# Patient Record
Sex: Female | Born: 1955
Health system: Southern US, Community
[De-identification: ages and names within clinical notes are randomized; demographics above are authoritative.]

## PROBLEM LIST (undated history)

## (undated) DIAGNOSIS — R002 Palpitations: Secondary | ICD-10-CM

## (undated) DIAGNOSIS — J439 Emphysema, unspecified: Secondary | ICD-10-CM

## (undated) DIAGNOSIS — I739 Peripheral vascular disease, unspecified: Secondary | ICD-10-CM

## (undated) DIAGNOSIS — R531 Weakness: Secondary | ICD-10-CM

## (undated) DIAGNOSIS — R079 Chest pain, unspecified: Secondary | ICD-10-CM

## (undated) DIAGNOSIS — R000 Tachycardia, unspecified: Secondary | ICD-10-CM

## (undated) DIAGNOSIS — G8194 Hemiplegia, unspecified affecting left nondominant side: Secondary | ICD-10-CM

## (undated) DIAGNOSIS — K219 Gastro-esophageal reflux disease without esophagitis: Secondary | ICD-10-CM

## (undated) DIAGNOSIS — F32A Depression, unspecified: Secondary | ICD-10-CM

## (undated) DIAGNOSIS — G4700 Insomnia, unspecified: Secondary | ICD-10-CM

## (undated) DIAGNOSIS — K449 Diaphragmatic hernia without obstruction or gangrene: Secondary | ICD-10-CM

## (undated) DIAGNOSIS — I493 Ventricular premature depolarization: Secondary | ICD-10-CM

## (undated) DIAGNOSIS — M62838 Other muscle spasm: Secondary | ICD-10-CM

## (undated) DIAGNOSIS — I499 Cardiac arrhythmia, unspecified: Secondary | ICD-10-CM

## (undated) DIAGNOSIS — F329 Major depressive disorder, single episode, unspecified: Secondary | ICD-10-CM

## (undated) DIAGNOSIS — B1920 Unspecified viral hepatitis C without hepatic coma: Secondary | ICD-10-CM

## (undated) DIAGNOSIS — K439 Ventral hernia without obstruction or gangrene: Secondary | ICD-10-CM

## (undated) DIAGNOSIS — I639 Cerebral infarction, unspecified: Secondary | ICD-10-CM

## (undated) DIAGNOSIS — E785 Hyperlipidemia, unspecified: Secondary | ICD-10-CM

## (undated) DIAGNOSIS — M6281 Muscle weakness (generalized): Secondary | ICD-10-CM

## (undated) DIAGNOSIS — R011 Cardiac murmur, unspecified: Secondary | ICD-10-CM

## (undated) DIAGNOSIS — I491 Atrial premature depolarization: Principal | ICD-10-CM

## (undated) DIAGNOSIS — I10 Essential (primary) hypertension: Secondary | ICD-10-CM

## (undated) DIAGNOSIS — F419 Anxiety disorder, unspecified: Secondary | ICD-10-CM

## (undated) DIAGNOSIS — B029 Zoster without complications: Secondary | ICD-10-CM

## (undated) DIAGNOSIS — K7460 Unspecified cirrhosis of liver: Secondary | ICD-10-CM

## (undated) DIAGNOSIS — R42 Dizziness and giddiness: Secondary | ICD-10-CM

## (undated) DIAGNOSIS — I69391 Dysphagia following cerebral infarction: Secondary | ICD-10-CM

## (undated) DIAGNOSIS — R0600 Dyspnea, unspecified: Secondary | ICD-10-CM

## (undated) DIAGNOSIS — B0223 Postherpetic polyneuropathy: Secondary | ICD-10-CM

## (undated) HISTORY — DX: Chest pain, unspecified: R07.9

## (undated) HISTORY — DX: Postherpetic polyneuropathy: B02.23

## (undated) HISTORY — PX: APPENDECTOMY: SHX54

## (undated) HISTORY — DX: Emphysema, unspecified: J43.9

## (undated) HISTORY — DX: Peripheral vascular disease, unspecified: I73.9

## (undated) HISTORY — DX: Unspecified cirrhosis of liver: K74.60

## (undated) HISTORY — PX: UPPER GASTROINTESTINAL ENDOSCOPY: SHX188

## (undated) HISTORY — DX: Tachycardia, unspecified: R00.0

## (undated) HISTORY — PX: CATARACT EXTRACTION, BILATERAL: SHX1313

## (undated) HISTORY — DX: Dizziness and giddiness: R42

## (undated) HISTORY — DX: Depression, unspecified: F32.A

## (undated) HISTORY — DX: Unspecified viral hepatitis C without hepatic coma: B19.20

## (undated) HISTORY — DX: Dyspnea, unspecified: R06.00

## (undated) HISTORY — DX: Ventral hernia without obstruction or gangrene: K43.9

## (undated) HISTORY — DX: Weakness: R53.1

## (undated) HISTORY — DX: Zoster without complications: B02.9

## (undated) HISTORY — DX: Diaphragmatic hernia without obstruction or gangrene: K44.9

## (undated) HISTORY — PX: ESOPHAGOGASTRODUODENOSCOPY: SHX1529

## (undated) HISTORY — DX: Ventricular premature depolarization: I49.3

## (undated) HISTORY — PX: COLONOSCOPY: SHX174

## (undated) HISTORY — DX: Atrial premature depolarization: I49.1

---
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
## 2000-03-19 ENCOUNTER — Encounter: Admission: RE | Admit: 2000-03-19 | Discharge: 2000-03-19 | Payer: Self-pay | Admitting: Family Medicine

## 2000-03-19 ENCOUNTER — Encounter: Payer: Self-pay | Admitting: Family Medicine

## 2004-07-10 ENCOUNTER — Emergency Department (HOSPITAL_COMMUNITY): Admission: EM | Admit: 2004-07-10 | Discharge: 2004-07-10 | Payer: Self-pay | Admitting: Emergency Medicine

## 2006-10-22 ENCOUNTER — Emergency Department (HOSPITAL_COMMUNITY): Admission: EM | Admit: 2006-10-22 | Discharge: 2006-10-22 | Payer: Self-pay | Admitting: Emergency Medicine

## 2007-04-07 ENCOUNTER — Emergency Department (HOSPITAL_COMMUNITY): Admission: EM | Admit: 2007-04-07 | Discharge: 2007-04-07 | Payer: Self-pay | Admitting: *Deleted

## 2007-12-25 ENCOUNTER — Emergency Department (HOSPITAL_COMMUNITY): Admission: EM | Admit: 2007-12-25 | Discharge: 2007-12-25 | Payer: Self-pay | Admitting: Emergency Medicine

## 2007-12-28 ENCOUNTER — Ambulatory Visit: Payer: Self-pay | Admitting: Psychiatry

## 2007-12-28 ENCOUNTER — Inpatient Hospital Stay (HOSPITAL_COMMUNITY): Admission: AD | Admit: 2007-12-28 | Discharge: 2008-01-02 | Payer: Self-pay | Admitting: Psychiatry

## 2008-01-05 ENCOUNTER — Other Ambulatory Visit (HOSPITAL_COMMUNITY): Admission: RE | Admit: 2008-01-05 | Discharge: 2008-03-11 | Payer: Self-pay | Admitting: Psychiatry

## 2008-01-15 ENCOUNTER — Ambulatory Visit: Payer: Self-pay | Admitting: Psychiatry

## 2008-02-12 ENCOUNTER — Ambulatory Visit: Payer: Self-pay | Admitting: Psychiatry

## 2008-05-17 ENCOUNTER — Other Ambulatory Visit: Admission: RE | Admit: 2008-05-17 | Discharge: 2008-05-17 | Payer: Self-pay | Admitting: Family Medicine

## 2009-05-26 ENCOUNTER — Emergency Department (HOSPITAL_COMMUNITY): Admission: EM | Admit: 2009-05-26 | Discharge: 2009-05-26 | Payer: Self-pay | Admitting: Emergency Medicine

## 2011-03-27 NOTE — H&P (Signed)
NAMEASCENCION, STEGNER              ACCOUNT NO.:  [id number]   MEDICAL RECORD NO.:  [id number]          PATIENT TYPE:  IPS   LOCATION:  0500                          FACILITY:  [facility]   PHYSICIAN:  Geoffery Lyons, M.D.      DATE OF BIRTH:  1955-11-24   DATE OF ADMISSION:  12/28/2007  DATE OF DISCHARGE:                       PSYCHIATRIC ADMISSION ASSESSMENT   IDENTIFYING INFORMATION:  A 55 year old African-American female.  This  is a voluntary admission.   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      Margaret A. Scott, N.P.      Geoffery Lyons, M.D.  Electronically Signed    MAS/MEDQ  D:  12/29/2007  T:  12/30/2007  Job:  0
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
## 2011-07-30 ENCOUNTER — Emergency Department (HOSPITAL_COMMUNITY)
Admission: EM | Admit: 2011-07-30 | Discharge: 2011-07-30 | Disposition: A | Discharge status: Home / Self Care | Payer: 59 | Attending: Emergency Medicine | Admitting: Emergency Medicine

## 2011-07-30 DIAGNOSIS — B86 Scabies: Secondary | ICD-10-CM | POA: Insufficient documentation

## 2011-08-03 LAB — CBC
Hemoglobin: 14.9
MCV: 96.2
RBC: 4.43
WBC: 8.5

## 2011-08-03 LAB — BENZODIAZEPINE, QUANTITATIVE, URINE
Alprazolam (GC/LC/MS), ur confirm: NEGATIVE
Flurazepam GC/MS Conf: NEGATIVE
Nordiazepam GC/MS Conf: NEGATIVE
Oxazepam GC/MS Conf: 870 ng/mL

## 2011-08-03 LAB — URINALYSIS, ROUTINE W REFLEX MICROSCOPIC
Bilirubin Urine: NEGATIVE
Glucose, UA: NEGATIVE
Ketones, ur: NEGATIVE
Leukocytes, UA: NEGATIVE
Nitrite: NEGATIVE
Protein, ur: NEGATIVE
Specific Gravity, Urine: 1.01
Urobilinogen, UA: 0.2
pH: 5.5

## 2011-08-03 LAB — URINE DRUGS OF ABUSE SCREEN W ALC, ROUTINE (REF LAB)
Ethyl Alcohol: <5
Marijuana Metabolite: NEGATIVE
Opiate Screen, Urine: NEGATIVE
Phencyclidine (PCP): NEGATIVE
Propoxyphene: NEGATIVE

## 2011-08-03 LAB — COMPREHENSIVE METABOLIC PANEL
ALT: 16
AST: 24
CO2: 27
Chloride: 107
Creatinine, Ser: 0.81
GFR calc Af Amer: >60
GFR calc non Af Amer: >60
Sodium: 143
Total Bilirubin: 0.8

## 2011-08-03 LAB — LIPASE, BLOOD: Lipase: 25

## 2011-08-03 LAB — TSH: TSH: 1.499

## 2011-08-03 LAB — URINE MICROSCOPIC-ADD ON

## 2011-08-03 LAB — DIFFERENTIAL
Basophils Absolute: 0
Eosinophils Absolute: 0
Eosinophils Relative: 0

## 2011-08-06 LAB — URINE DRUGS OF ABUSE SCREEN W ALC, ROUTINE (REF LAB)
Barbiturate Quant, Ur: NEGATIVE
Barbiturate Quant, Ur: NEGATIVE
Barbiturate Quant, Ur: NEGATIVE
Barbiturate Quant, Ur: NEGATIVE
Benzodiazepines.: NEGATIVE
Benzodiazepines.: NEGATIVE
Benzodiazepines.: NEGATIVE
Benzodiazepines.: NEGATIVE
Ethyl Alcohol: <5
Ethyl Alcohol: <5
Ethyl Alcohol: <5
Methadone: NEGATIVE
Methadone: NEGATIVE
Phencyclidine (PCP): NEGATIVE
Phencyclidine (PCP): NEGATIVE
Phencyclidine (PCP): NEGATIVE
Phencyclidine (PCP): NEGATIVE
Propoxyphene: NEGATIVE
Propoxyphene: NEGATIVE

## 2011-08-07 LAB — URINE DRUGS OF ABUSE SCREEN W ALC, ROUTINE (REF LAB)
Barbiturate Quant, Ur: NEGATIVE
Barbiturate Quant, Ur: NEGATIVE
Barbiturate Quant, Ur: NEGATIVE
Benzodiazepines.: NEGATIVE
Benzodiazepines.: NEGATIVE
Benzodiazepines.: NEGATIVE
Creatinine,U: 30.2
Marijuana Metabolite: NEGATIVE
Marijuana Metabolite: NEGATIVE
Methadone: NEGATIVE
Methadone: NEGATIVE
Methadone: NEGATIVE
Phencyclidine (PCP): NEGATIVE
Phencyclidine (PCP): NEGATIVE
Phencyclidine (PCP): NEGATIVE
Propoxyphene: NEGATIVE

## 2013-05-04 ENCOUNTER — Inpatient Hospital Stay (HOSPITAL_COMMUNITY): Payer: Self-pay

## 2013-05-04 ENCOUNTER — Encounter (HOSPITAL_COMMUNITY): Payer: Self-pay | Admitting: *Deleted

## 2013-05-04 ENCOUNTER — Inpatient Hospital Stay (HOSPITAL_COMMUNITY)
Admission: EM | Admit: 2013-05-04 | Discharge: 2013-05-13 | DRG: 433 | Disposition: A | Discharge status: Home / Self Care | Payer: MEDICAID | Attending: Internal Medicine | Admitting: Internal Medicine

## 2013-05-04 DIAGNOSIS — R17 Unspecified jaundice: Secondary | ICD-10-CM

## 2013-05-04 DIAGNOSIS — R7402 Elevation of levels of lactic acid dehydrogenase (LDH): Secondary | ICD-10-CM | POA: Diagnosis present

## 2013-05-04 DIAGNOSIS — N390 Urinary tract infection, site not specified: Secondary | ICD-10-CM | POA: Diagnosis present

## 2013-05-04 DIAGNOSIS — B182 Chronic viral hepatitis C: Secondary | ICD-10-CM | POA: Diagnosis present

## 2013-05-04 DIAGNOSIS — E860 Dehydration: Secondary | ICD-10-CM | POA: Diagnosis present

## 2013-05-04 DIAGNOSIS — K7010 Alcoholic hepatitis without ascites: Principal | ICD-10-CM | POA: Diagnosis present

## 2013-05-04 DIAGNOSIS — F411 Generalized anxiety disorder: Secondary | ICD-10-CM | POA: Diagnosis present

## 2013-05-04 DIAGNOSIS — K7200 Acute and subacute hepatic failure without coma: Secondary | ICD-10-CM | POA: Diagnosis present

## 2013-05-04 DIAGNOSIS — F1020 Alcohol dependence, uncomplicated: Secondary | ICD-10-CM | POA: Diagnosis present

## 2013-05-04 DIAGNOSIS — R7401 Elevation of levels of liver transaminase levels: Secondary | ICD-10-CM | POA: Diagnosis present

## 2013-05-04 DIAGNOSIS — K759 Inflammatory liver disease, unspecified: Secondary | ICD-10-CM

## 2013-05-04 DIAGNOSIS — N179 Acute kidney failure, unspecified: Secondary | ICD-10-CM | POA: Diagnosis present

## 2013-05-04 DIAGNOSIS — B029 Zoster without complications: Secondary | ICD-10-CM | POA: Diagnosis present

## 2013-05-04 DIAGNOSIS — F17200 Nicotine dependence, unspecified, uncomplicated: Secondary | ICD-10-CM | POA: Diagnosis present

## 2013-05-04 DIAGNOSIS — F1010 Alcohol abuse, uncomplicated: Secondary | ICD-10-CM

## 2013-05-04 HISTORY — DX: Unspecified viral hepatitis C without hepatic coma: B19.20

## 2013-05-04 LAB — CBC WITH DIFFERENTIAL/PLATELET
Basophils Absolute: 0.1 10*3/uL (ref 0.0–0.1)
Basophils Relative: 1 % (ref 0–1)
Eosinophils Absolute: 0.1 10*3/uL (ref 0.0–0.7)
Eosinophils Relative: 1 % (ref 0–5)
Lymphs Abs: 2.3 10*3/uL (ref 0.7–4.0)
MCH: 34.1 pg — ABNORMAL HIGH (ref 26.0–34.0)
MCHC: 36.2 g/dL — ABNORMAL HIGH (ref 30.0–36.0)
MCV: 94.4 fL (ref 78.0–100.0)
Neutrophils Relative %: 64 % (ref 43–77)
Platelets: 175 10*3/uL (ref 150–400)
RBC: 5.36 MIL/uL — ABNORMAL HIGH (ref 3.87–5.11)
RDW: 14.4 % (ref 11.5–15.5)

## 2013-05-04 LAB — RAPID URINE DRUG SCREEN, HOSP PERFORMED
Cocaine: NOT DETECTED
Opiates: NOT DETECTED

## 2013-05-04 LAB — COMPREHENSIVE METABOLIC PANEL
ALT: 2209 U/L — ABNORMAL HIGH (ref 0–35)
AST: 3314 U/L — ABNORMAL HIGH (ref 0–37)
CO2: 26 mEq/L (ref 19–32)
Calcium: 9.7 mg/dL (ref 8.4–10.5)
Chloride: 100 mEq/L (ref 96–112)
GFR calc Af Amer: 53 mL/min — ABNORMAL LOW (ref >90)
GFR calc non Af Amer: 46 mL/min — ABNORMAL LOW (ref >90)
Glucose, Bld: 112 mg/dL — ABNORMAL HIGH (ref 70–99)
Sodium: 138 mEq/L (ref 135–145)
Total Bilirubin: 19.6 mg/dL (ref 0.3–1.2)

## 2013-05-04 LAB — PROTIME-INR: Prothrombin Time: 16.5 seconds — ABNORMAL HIGH (ref 11.6–15.2)

## 2013-05-04 LAB — URINE MICROSCOPIC-ADD ON

## 2013-05-04 LAB — URINALYSIS, ROUTINE W REFLEX MICROSCOPIC
Glucose, UA: NEGATIVE mg/dL
Ketones, ur: 15 mg/dL — AB
Nitrite: POSITIVE — AB
Protein, ur: 30 mg/dL — AB
Urobilinogen, UA: 1 mg/dL (ref 0.0–1.0)

## 2013-05-04 MED ORDER — VITAMIN B-1 100 MG PO TABS
100.0000 mg | ORAL_TABLET | Freq: Every day | ORAL | Status: DC
  Administered 2013-05-04 – 2013-05-13 (×10): 100 mg via ORAL
  Filled 2013-05-04 (×10): qty 1

## 2013-05-04 MED ORDER — SODIUM CHLORIDE 0.9 % IV BOLUS (SEPSIS)
500.0000 mL | Freq: Once | INTRAVENOUS | Status: AC
  Administered 2013-05-04: 500 mL via INTRAVENOUS

## 2013-05-04 MED ORDER — FOLIC ACID 1 MG PO TABS
1.0000 mg | ORAL_TABLET | Freq: Every day | ORAL | Status: DC
  Administered 2013-05-04 – 2013-05-13 (×10): 1 mg via ORAL
  Filled 2013-05-04 (×10): qty 1

## 2013-05-04 MED ORDER — ONDANSETRON HCL 4 MG/2ML IJ SOLN: 4.0000 mg | Freq: Three times a day (TID) | INTRAMUSCULAR | Status: DC | PRN

## 2013-05-04 MED ORDER — PANTOPRAZOLE SODIUM 40 MG IV SOLR
40.0000 mg | Freq: Once | INTRAVENOUS | Status: AC
  Administered 2013-05-04: 40 mg via INTRAVENOUS
  Filled 2013-05-04: qty 40

## 2013-05-04 MED ORDER — ONDANSETRON HCL 4 MG PO TABS
4.0000 mg | ORAL_TABLET | Freq: Four times a day (QID) | ORAL | Status: DC | PRN
  Administered 2013-05-04 – 2013-05-11 (×4): 4 mg via ORAL
  Filled 2013-05-04 (×4): qty 1

## 2013-05-04 MED ORDER — ALPRAZOLAM 0.5 MG PO TABS
0.5000 mg | ORAL_TABLET | Freq: Three times a day (TID) | ORAL | Status: DC | PRN
  Administered 2013-05-05 – 2013-05-06 (×3): 0.5 mg via ORAL
  Filled 2013-05-04 (×3): qty 1

## 2013-05-04 MED ORDER — ONDANSETRON HCL 4 MG/2ML IJ SOLN
4.0000 mg | Freq: Four times a day (QID) | INTRAMUSCULAR | Status: DC | PRN
  Administered 2013-05-07 – 2013-05-13 (×7): 4 mg via INTRAVENOUS
  Filled 2013-05-04 (×7): qty 2

## 2013-05-04 MED ORDER — SODIUM CHLORIDE 0.9 % IV BOLUS (SEPSIS): 500.0000 mL | Freq: Once | INTRAVENOUS | Status: DC

## 2013-05-04 MED ORDER — ONDANSETRON HCL 4 MG/2ML IJ SOLN
4.0000 mg | Freq: Once | INTRAMUSCULAR | Status: AC
  Administered 2013-05-04: 4 mg via INTRAVENOUS
  Filled 2013-05-04: qty 2

## 2013-05-04 MED ORDER — SODIUM CHLORIDE 0.9 % IV SOLN: INTRAVENOUS | Status: DC

## 2013-05-04 MED ORDER — SODIUM CHLORIDE 0.9 % IV SOLN
INTRAVENOUS | Status: DC
  Administered 2013-05-04 – 2013-05-05 (×3): via INTRAVENOUS
  Administered 2013-05-06: 1000 mL via INTRAVENOUS
  Administered 2013-05-07: 23:00:00 via INTRAVENOUS
  Administered 2013-05-07: 1000 mL via INTRAVENOUS
  Administered 2013-05-08 – 2013-05-13 (×8): via INTRAVENOUS

## 2013-05-04 MED ORDER — DEXTROSE 5 % IV SOLN
1.0000 g | INTRAVENOUS | Status: DC
  Administered 2013-05-04: 1 g via INTRAVENOUS
  Filled 2013-05-04: qty 10

## 2013-05-04 MED ORDER — SENNOSIDES-DOCUSATE SODIUM 8.6-50 MG PO TABS
1.0000 | ORAL_TABLET | Freq: Every evening | ORAL | Status: DC | PRN
  Administered 2013-05-09: 1 via ORAL
  Filled 2013-05-04: qty 1

## 2013-05-04 MED ORDER — OXYCODONE HCL 5 MG PO TABS
5.0000 mg | ORAL_TABLET | ORAL | Status: DC | PRN
  Administered 2013-05-04 – 2013-05-13 (×20): 5 mg via ORAL
  Filled 2013-05-04 (×20): qty 1

## 2013-05-04 NOTE — H&P (Signed)
[facility]          History and Physical    PCP:   Benita Stabile, MD   Chief Complaint:  Yellow eyes  HPI: 57 y/o woman with h/o ETOH abuse who states she quit 4 weeks ago, comes in today because a friend told her that her eyes were yellow. She has also been having suprapubic pain and dark-colored urine. In the ED she was found to have very elevated LFTs. We have been asked to admit her for further evaluation and management.  Allergies:  No Known Allergies   History reviewed. No pertinent past medical history.  Past Surgical History  Procedure Laterality Date  . Appendectomy      Prior to Admission medications   Medication Sig Start Date End Date Taking? Authorizing Provider  ALPRAZolam Prudy Feeler) 0.5 MG tablet Take 0.5 mg by mouth 3 (three) times daily as needed for anxiety.   Yes Historical Provider, MD  gabapentin (NEURONTIN) 600 MG tablet Take 600 mg by mouth 2 (two) times daily.   Yes Historical Provider, MD    Social History:  reports that she has been smoking.  She does not have any smokeless tobacco history on file. She reports that  drinks alcohol. She reports that she does not use illicit drugs.  No family history on file.  Review of Systems:  Constitutional: Denies fever, chills, diaphoresis, appetite change and fatigue.  HEENT: Denies photophobia, eye pain, redness, hearing loss, ear pain, congestion, sore throat, rhinorrhea, sneezing, mouth sores, trouble swallowing, neck pain, neck stiffness and tinnitus.   Respiratory: Denies SOB, DOE, cough, chest tightness,  and wheezing.   Cardiovascular: Denies chest pain, palpitations and leg swelling.  Gastrointestinal: Denies nausea, vomiting, diarrhea, constipation, blood in stool and abdominal distention.  Genitourinary: Denies dysuria, urgency, frequency, hematuria, flank pain and difficulty urinating.  Endocrine: Denies: hot or cold intolerance, sweats, changes in hair or nails, polyuria,  polydipsia. Musculoskeletal: Denies myalgias, back pain, joint swelling, arthralgias and gait problem.  Skin: Denies pallor, rash and wound.  Neurological: Denies dizziness, seizures, syncope, weakness, light-headedness, numbness and headaches.  Hematological: Denies adenopathy. Easy bruising, personal or family bleeding history  Psychiatric/Behavioral: Denies suicidal ideation, mood changes, confusion, nervousness, sleep disturbance and agitation   Physical Exam: Blood pressure 111/75, pulse 91, temperature 98.5 F (36.9 C), temperature source Oral, resp. rate 18, SpO2 99.00%. Gen: AA Ox3, NAD HEENT: [place]/AT/PERRL/EOMI Neck: supple, no JVD, no LAD, no bruits, no goiter. CV: RRR, no M/R/G. Lungs: CTA B ABd: S/NT/ND/+BS/no masses Ext: no C/C/E Neuro: grossly intact and non-focal.  Labs on Admission:  Results for orders placed during the hospital encounter of 05/04/13 (from the past 48 hour(s))  COMPREHENSIVE METABOLIC PANEL     Status: Abnormal   Collection Time    05/04/13  4:18 PM      Result Value Range   Sodium 138  135 - 145 mEq/L   Potassium 3.4 (*) 3.5 - 5.1 mEq/L   Chloride 100  96 - 112 mEq/L   CO2 26  19 - 32 mEq/L   Glucose, Bld 112 (*) 70 - 99 mg/dL   BUN 10  6 - 23 mg/dL   Creatinine, Ser 4.78 (*) 0.50 - 1.10 mg/dL   Comment: ICTERUS AT THIS LEVEL MAY AFFECT RESULT   Calcium 9.7  8.4 - 10.5 mg/dL   Total Protein 6.4  6.0 - 8.3 g/dL   Comment: ICTERUS AT THIS LEVEL MAY AFFECT RESULT   Albumin 3.4 (*) 3.5 -  5.2 g/dL   AST 1610 (*) 0 - 37 U/L   ALT 2209 (*) 0 - 35 U/L   Alkaline Phosphatase 371 (*) 39 - 117 U/L   Total Bilirubin 19.6 (*) 0.3 - 1.2 mg/dL   Comment: CRITICAL RESULT CALLED TO, READ BACK BY AND VERIFIED WITH:     RN D. VOIGT 05/04/13 1733 KERAN M.   GFR calc non Af Amer 46 (*) >90 mL/min   GFR calc Af Amer 53 (*) >90 mL/min   Comment:            The eGFR has been calculated     using the CKD EPI equation.     This calculation has not been      validated in all clinical     situations.     eGFR's persistently     <90 mL/min signify     possible Chronic Kidney Disease.  LIPASE, BLOOD     Status: None   Collection Time    05/04/13  4:18 PM      Result Value Range   Lipase 48  11 - 59 U/L  CBC WITH DIFFERENTIAL     Status: Abnormal   Collection Time    05/04/13  4:18 PM      Result Value Range   WBC 9.3  4.0 - 10.5 K/uL   RBC 5.36 (*) 3.87 - 5.11 MIL/uL   Hemoglobin 18.3 (*) 12.0 - 15.0 g/dL   HCT 96.0 (*) 45.4 - 09.8 %   MCV 94.4  78.0 - 100.0 fL   MCH 34.1 (*) 26.0 - 34.0 pg   MCHC 36.2 (*) 30.0 - 36.0 g/dL   RDW 11.9  14.7 - 82.9 %   Platelets 175  150 - 400 K/uL   Neutrophils Relative % 64  43 - 77 %   Neutro Abs 5.9  1.7 - 7.7 K/uL   Lymphocytes Relative 25  12 - 46 %   Lymphs Abs 2.3  0.7 - 4.0 K/uL   Monocytes Relative 10  3 - 12 %   Monocytes Absolute 0.9  0.1 - 1.0 K/uL   Eosinophils Relative 1  0 - 5 %   Eosinophils Absolute 0.1  0.0 - 0.7 K/uL   Basophils Relative 1  0 - 1 %   Basophils Absolute 0.1  0.0 - 0.1 K/uL  URINALYSIS, ROUTINE W REFLEX MICROSCOPIC     Status: Abnormal   Collection Time    05/04/13  4:37 PM      Result Value Range   Color, Urine BROWN (*) YELLOW   Comment: BIOCHEMICALS MAY BE AFFECTED BY COLOR   APPearance CLOUDY (*) CLEAR   Specific Gravity, Urine 1.021  1.005 - 1.030   pH 5.5  5.0 - 8.0   Glucose, UA NEGATIVE  NEGATIVE mg/dL   Hgb urine dipstick NEGATIVE  NEGATIVE   Bilirubin Urine LARGE (*) NEGATIVE   Ketones, ur 15 (*) NEGATIVE mg/dL   Protein, ur 30 (*) NEGATIVE mg/dL   Urobilinogen, UA 1.0  0.0 - 1.0 mg/dL   Nitrite POSITIVE (*) NEGATIVE   Leukocytes, UA SMALL (*) NEGATIVE  URINE MICROSCOPIC-ADD ON     Status: Abnormal   Collection Time    05/04/13  4:37 PM      Result Value Range   Squamous Epithelial / LPF RARE  RARE   WBC, UA 21-50  <3 WBC/hpf   RBC / HPF 0-2  <3 RBC/hpf   Bacteria, UA FEW (*) RARE  Casts HYALINE CASTS (*) NEGATIVE   Comment: WBC CAST    Urine-Other MUCOUS PRESENT     Comment: AMORPHOUS URATES/PHOSPHATES  PROTIME-INR     Status: Abnormal   Collection Time    05/04/13  6:07 PM      Result Value Range   Prothrombin Time 16.5 (*) 11.6 - 15.2 seconds   INR 1.37  0.00 - 1.49  ETHANOL     Status: None   Collection Time    05/04/13  6:07 PM      Result Value Range   Alcohol, Ethyl (B) <11  0 - 11 mg/dL   Comment:            LOWEST DETECTABLE LIMIT FOR     SERUM ALCOHOL IS 11 mg/dL     FOR MEDICAL PURPOSES ONLY    Radiological Exams on Admission: No results found.  Assessment/Plan Principal Problem:   Jaundice Active Problems:   Transaminitis   ARF (acute renal failure)   UTI (lower urinary tract infection)   Jaundice/Transaminitis -Has severe transaminitis that is not in an obstructive pattern. -Will order a RUQ [place], acute hepatitis panel and a tylenol level. -not on a statin or any other liver-toxic meds. -Suspect this is mainly from her chronic ETOH use. -Consider GI eval pending results of work up.  UTI -Continue rocephin pending cx data.  ARF -Possibly pre-renal in origin. -IVF, recheck renal function in am.  DVT Prophylaxis -SCDs.   Time Spent on Admission: 75 minutes  HERNANDEZ ACOSTA,ESTELA [facility] Pager: [phone] 05/04/2013, 6:57 PM

## 2013-05-04 NOTE — ED Notes (Signed)
Patient transported to Ultrasound 

## 2013-05-04 NOTE — Progress Notes (Signed)
Pt is alert and oriented from home with family. Transferred to unit via stretcher. Patient is oriented to room and call bell is within reach. Patient has no skin issues. Will continue to monitor.   Verley Pariseau J. Lendell Caprice RN

## 2013-05-04 NOTE — Progress Notes (Signed)
Unit CM UR Completed by [facility] ED CM  W. Phillp Dolores RN  

## 2013-05-04 NOTE — ED Notes (Signed)
PT used to drink daily and then stopped drinking on Saturday.  Since then she has been having lower abdominal pain, nausea, bad taste in her mouth, urine looks brown but now states now bright yellow.  Pt states that today her eyes are yellow.  Pt reports head hurts sometimes too.  Pt reports if she eats she vomits

## 2013-05-04 NOTE — ED Notes (Signed)
Family updated on plan of care. Pt. In ultrasound.

## 2013-05-04 NOTE — ED Provider Notes (Addendum)
History    CSN: [id number] Arrival date & time 05/04/13  1603  First MD Initiated Contact with Patient 05/04/13 1706      Chief complaint: jaundice   (Consider location/radiation/quality/duration/timing/severity/associated sxs/prior Treatment) The history is provided by the patient.  pt with hx  etoh abuse, states noticed eyes yellow in past week, and also has noticed urine dark in color. No dysuria or urgency. States intermittent abd cramping/discomfort, but no constant or focal abd pain. Pt denies specific exacerbating or alleviating factors. Poor appetite. No known wt loss. Hx heavy/daily etoh use, states stopped drinking 2 days ago. Denies hx etoh withdrawal or dts. Denies tremor or shakes. Nausea. No vomiting. Had bm today, sl loose. No melena or rectal bleeding. Denies fever or chills. Denies prior dx liver disease.    History reviewed. No pertinent past medical history. Past Surgical History  Procedure Laterality Date  . Appendectomy     No family history on file. History  Substance Use Topics  . Smoking status: Current Some Day Smoker  . Smokeless tobacco: Not on file  . Alcohol Use: Yes     Comment: stopped on saturday   OB History   Grav Para Term Preterm Abortions TAB SAB Ect Mult Living                 Review of Systems  Constitutional: Negative for fever and chills.  HENT: Negative for neck pain.   Eyes: Negative for visual disturbance.  Respiratory: Negative for cough and shortness of breath.   Cardiovascular: Negative for chest pain.  Gastrointestinal: Negative for vomiting and abdominal pain.  Genitourinary: Negative for flank pain.  Musculoskeletal: Negative for back pain.  Skin: Negative for rash.  Neurological: Negative for headaches.  Hematological: Does not bruise/bleed easily.  Psychiatric/Behavioral: Negative for confusion.    Allergies  Review of patient's allergies indicates no known allergies.  Home Medications   Current Outpatient Rx   Name  Route  Sig  Dispense  Refill  . ALPRAZolam (XANAX) 0.5 MG tablet   Oral   Take 0.5 mg by mouth 3 (three) times daily as needed for anxiety.         . gabapentin (NEURONTIN) 600 MG tablet   Oral   Take 600 mg by mouth 2 (two) times daily.          BP 111/75  Pulse 91  Temp(Src) 98.5 F (36.9 C) (Oral)  Resp 18  SpO2 99% Physical Exam  Nursing note and vitals reviewed. Constitutional: She is oriented to person, place, and time. She appears well-developed and well-nourished. No distress.  HENT:  Mouth/Throat: Oropharynx is clear and moist.  Eyes: Conjunctivae are normal. Scleral icterus is present.  Neck: Neck supple. No tracheal deviation present.  Cardiovascular: Normal rate, regular rhythm, normal heart sounds and intact distal pulses.   Pulmonary/Chest: Effort normal and breath sounds normal. No respiratory distress.  Abdominal: Soft. Normal appearance and bowel sounds are normal. She exhibits no distension. There is no tenderness. There is no rebound and no guarding.  Genitourinary:  No cva tenderness  Musculoskeletal: She exhibits no edema and no tenderness.  Neurological: She is alert and oriented to person, place, and time.  Motor intact bil. Steady gait.   Skin: Skin is warm and dry. No rash noted.  Psychiatric: She has a normal mood and affect.    ED Course  Procedures (including critical care time)  Results for orders placed during the hospital encounter of 05/04/13  COMPREHENSIVE METABOLIC  PANEL      Result Value Range   Sodium 138  135 - 145 mEq/L   Potassium 3.4 (*) 3.5 - 5.1 mEq/L   Chloride 100  96 - 112 mEq/L   CO2 26  19 - 32 mEq/L   Glucose, Bld 112 (*) 70 - 99 mg/dL   BUN 10  6 - 23 mg/dL   Creatinine, Ser 5.62 (*) 0.50 - 1.10 mg/dL   Calcium 9.7  8.4 - 13.0 mg/dL   Total Protein 6.4  6.0 - 8.3 g/dL   Albumin 3.4 (*) 3.5 - 5.2 g/dL   AST 8657 (*) 0 - 37 U/L   ALT 2209 (*) 0 - 35 U/L   Alkaline Phosphatase 371 (*) 39 - 117 U/L   Total  Bilirubin 19.6 (*) 0.3 - 1.2 mg/dL   GFR calc non Af Amer 46 (*) >90 mL/min   GFR calc Af Amer 53 (*) >90 mL/min  LIPASE, BLOOD      Result Value Range   Lipase 48  11 - 59 U/L  CBC WITH DIFFERENTIAL      Result Value Range   WBC 9.3  4.0 - 10.5 K/uL   RBC 5.36 (*) 3.87 - 5.11 MIL/uL   Hemoglobin 18.3 (*) 12.0 - 15.0 g/dL   HCT 84.6 (*) 96.2 - 95.2 %   MCV 94.4  78.0 - 100.0 fL   MCH 34.1 (*) 26.0 - 34.0 pg   MCHC 36.2 (*) 30.0 - 36.0 g/dL   RDW 84.1  32.4 - 40.1 %   Platelets 175  150 - 400 K/uL   Neutrophils Relative % 64  43 - 77 %   Neutro Abs 5.9  1.7 - 7.7 K/uL   Lymphocytes Relative 25  12 - 46 %   Lymphs Abs 2.3  0.7 - 4.0 K/uL   Monocytes Relative 10  3 - 12 %   Monocytes Absolute 0.9  0.1 - 1.0 K/uL   Eosinophils Relative 1  0 - 5 %   Eosinophils Absolute 0.1  0.0 - 0.7 K/uL   Basophils Relative 1  0 - 1 %   Basophils Absolute 0.1  0.0 - 0.1 K/uL  URINALYSIS, ROUTINE W REFLEX MICROSCOPIC      Result Value Range   Color, Urine BROWN (*) YELLOW   APPearance CLOUDY (*) CLEAR   Specific Gravity, Urine 1.021  1.005 - 1.030   pH 5.5  5.0 - 8.0   Glucose, UA NEGATIVE  NEGATIVE mg/dL   Hgb urine dipstick NEGATIVE  NEGATIVE   Bilirubin Urine LARGE (*) NEGATIVE   Ketones, ur 15 (*) NEGATIVE mg/dL   Protein, ur 30 (*) NEGATIVE mg/dL   Urobilinogen, UA 1.0  0.0 - 1.0 mg/dL   Nitrite POSITIVE (*) NEGATIVE   Leukocytes, UA SMALL (*) NEGATIVE  URINE MICROSCOPIC-ADD ON      Result Value Range   Squamous Epithelial / LPF RARE  RARE   WBC, UA 21-50  <3 WBC/hpf   RBC / HPF 0-2  <3 RBC/hpf   Bacteria, UA FEW (*) RARE   Casts HYALINE CASTS (*) NEGATIVE   Urine-Other MUCOUS PRESENT       MDM  Iv ns. Labs.   Reviewed nursing notes and prior charts for additional history.   ua w 21-50 wbc, few bact, culture sent. Rocephin iv.  protonix iv. zofran iv.  Additional ns iv.  Med service called for admission.  Discussed w Dr Ardyth Harps who indicates temp orders, team 6,  med/surg bed, and to add u/s abd to workup - done.       Suzi Roots, MD 05/04/13 [phone]

## 2013-05-05 DIAGNOSIS — N390 Urinary tract infection, site not specified: Secondary | ICD-10-CM

## 2013-05-05 DIAGNOSIS — K759 Inflammatory liver disease, unspecified: Secondary | ICD-10-CM

## 2013-05-05 LAB — URINE CULTURE: Colony Count: 60000

## 2013-05-05 LAB — COMPREHENSIVE METABOLIC PANEL
ALT: 1453 U/L — ABNORMAL HIGH (ref 0–35)
Alkaline Phosphatase: 267 U/L — ABNORMAL HIGH (ref 39–117)
CO2: 22 mEq/L (ref 19–32)
GFR calc Af Amer: 76 mL/min — ABNORMAL LOW (ref >90)
GFR calc non Af Amer: 66 mL/min — ABNORMAL LOW (ref >90)
Glucose, Bld: 84 mg/dL (ref 70–99)
Potassium: 4 mEq/L (ref 3.5–5.1)
Sodium: 138 mEq/L (ref 135–145)

## 2013-05-05 LAB — ACETAMINOPHEN LEVEL: Acetaminophen (Tylenol), Serum: <15 ug/mL (ref 10–30)

## 2013-05-05 LAB — HEPATIC FUNCTION PANEL
ALT: 1550 U/L — ABNORMAL HIGH (ref 0–35)
AST: 2366 U/L — ABNORMAL HIGH (ref 0–37)
Alkaline Phosphatase: 272 U/L — ABNORMAL HIGH (ref 39–117)
Bilirubin, Direct: 11.4 mg/dL — ABNORMAL HIGH (ref 0.0–0.3)
Total Bilirubin: 14.8 mg/dL — ABNORMAL HIGH (ref 0.3–1.2)

## 2013-05-05 LAB — HEPATITIS PANEL, ACUTE
HCV Ab: REACTIVE — AB
Hepatitis B Surface Ag: NEGATIVE
Hepatitis B Surface Ag: NEGATIVE

## 2013-05-05 LAB — CBC
MCHC: 36.3 g/dL — ABNORMAL HIGH (ref 30.0–36.0)
Platelets: 219 10*3/uL (ref 150–400)
RDW: 14.6 % (ref 11.5–15.5)

## 2013-05-05 NOTE — Consult Note (Signed)
Subjective:   HPI  The patient is a 57 year old female with a history of heavy alcohol use for years. She came to the hospital where she was admitted after friends told her that her eyes were yellow. She didn't notice this. She states that she quit drinking alcohol about a month ago because after she had a beer she didn't feel good. She does have some generalized lower abdominal discomfort. She states that she has had some vomiting but denies hematemesis. She denies IV drug use. She has not started on any new medications recently. Her liver enzymes were found to be markedly elevated.  Review of Systems Jaundice, no chest pain or shortness of breath History reviewed. No pertinent past medical history. Past Surgical History  Procedure Laterality Date  . Appendectomy     History   Social History  . Marital Status: Divorced    Spouse Name: N/A    Number of Children: N/A  . Years of Education: N/A   Occupational History  . Not on file.   Social History Main Topics  . Smoking status: Current Some Day Smoker  . Smokeless tobacco: Not on file  . Alcohol Use: Yes     Comment: stopped on saturday  . Drug Use: No  . Sexually Active: Not on file   Other Topics Concern  . Not on file   Social History Narrative  . No narrative on file   family history is not on file. Current facility-administered medications:0.9 %  sodium chloride infusion, , Intravenous, Continuous, Henderson Cloud, MD, Last Rate: 75 mL/hr at 05/05/13 0955;  ALPRAZolam Prudy Feeler) tablet 0.5 mg, 0.5 mg, Oral, TID PRN, Henderson Cloud, MD, 0.5 mg at 05/05/13 1116;  folic acid (FOLVITE) tablet 1 mg, 1 mg, Oral, Daily, Henderson Cloud, MD, 1 mg at 05/05/13 0955 ondansetron [facility]) injection 4 mg, 4 mg, Intravenous, Q6H PRN, Henderson Cloud, MD;  ondansetron [facility]) tablet 4 mg, 4 mg, Oral, Q6H PRN, Henderson Cloud, MD, 4 mg at 05/04/13 2149;  oxyCODONE (Oxy IR/ROXICODONE)  immediate release tablet 5 mg, 5 mg, Oral, Q4H PRN, Henderson Cloud, MD, 5 mg at 05/05/13 1116;  senna-docusate (Senokot-S) tablet 1 tablet, 1 tablet, Oral, QHS PRN, Henderson Cloud, MD thiamine (VITAMIN B-1) tablet 100 mg, 100 mg, Oral, Daily, Henderson Cloud, MD, 100 mg at 05/05/13 0955 No Known Allergies   Objective:     BP 93/55  Pulse 68  Temp(Src) 98.3 F (36.8 C) (Oral)  Resp 16  Ht 5\' 5"  (1.651 m)  Wt 51.891 kg (114 lb 6.4 oz)  BMI 19.04 kg/m2  SpO2 97%  Alert and oriented and in no acute distress  Eyes markedly jaundiced sclera  Heart regular rhythm no murmurs  Lungs clear  Abdomen: Bowel sounds normal, soft, nontender no obvious enlargement of liver or spleen that I could feel.  Review of labs show marked elevation of transaminases. There is also a positive hepatitis C antibody. Hepatitis A and B. studies are pending    Laboratory No components found with this basename: d1      Assessment:     #1. Elevated liver enzymes, I can't tell right now whether we are dealing with alcoholic hepatitis given the history that she hasn't had any alcohol in the past month.  #2. History of alcohol abuse  #3. Positive hepatitis C antibody       Plan:     Supportive care. Monitor  LFTs. Avoid alcohol completely. Check on the rest of the hepatitis profile. Lab Results  Component Value Date   HGB 15.0 05/05/2013   HGB 18.3* 05/04/2013   HGB 14.9 12/25/2007   HCT 41.3 05/05/2013   HCT 50.6* 05/04/2013   HCT 42.6 12/25/2007   ALKPHOS 267* 05/05/2013   ALKPHOS 272* 05/05/2013   ALKPHOS 371* 05/04/2013   ALKPHOS 104 12/25/2007   AST 2283* 05/05/2013   AST 2366* 05/05/2013   AST 3314* 05/04/2013   AST 24 12/25/2007   ALT 1453* 05/05/2013   ALT 1550* 05/05/2013   ALT 2209* 05/04/2013   ALT 16 12/25/2007

## 2013-05-05 NOTE — Progress Notes (Signed)
   CARE MANAGEMENT NOTE 05/05/2013  Patient:  CHAKA, BOYSON   Account Number:  [id number]  Date Initiated:  05/05/2013  Documentation initiated by:  [facility]  Subjective/Objective Assessment:   abd pain, UTI, hepatitis C     Action/Plan:   lives at home with dtr, [address] [phone]   Anticipated DC Date:  05/06/2013   Anticipated DC Plan:  HOME/SELF CARE      DC Planning Services  CM consult      Choice offered to / List presented to:             Status of service:  In process, will continue to follow Medicare Important Message given?   (If response is "NO", the following Medicare IM given date fields will be blank) Date Medicare IM given:   Date Additional Medicare IM given:    Discharge Disposition:    Per UR Regulation:    If discussed at Long Length of Stay Meetings, dates discussed:    Comments:  05/05/2013 1430 NCM spoke to pt and gave permission to speak to dtr or son. Dtr, Efraim Kaufmann reports pt goes to Dr [name]. Her out of pocket cost runs $25. She usually goes 2x per year. Pt states her out of pocket cost for her medications run $16 at [facility]. Isidoro Donning RN CCM Case Mgmt phone [phone]

## 2013-05-05 NOTE — Progress Notes (Signed)
[facility] PROGRESS NOTE  Kara Bailey VHQ:[id number] DOB: Feb 06, 1956 DOA: 05/04/2013 PCP: Benita Stabile, MD  Assessment/Plan:  Principle problem Jaundice/transaminitis - probably secondary to alcoholic hepatitis,  acetaminophen level normal -Positive Hep C AB-check viral load - clinically asymptomatic; afebrile, no leukocytosis; PT/INR normal;  - continue monitoring LFTs;  - monitor albumin and total protein; both decreased from yesterday - possibly early cirrhosis on abdominal ultrasound, no significant biliary or pancreatic abnormalities - continue thiamine and folic acid, follow CIWA protocol   Active problem  Acute renal failure -resolved with IVF  Urinary tract infection - urine culture pending - assymptomatic-await urine cultures -monitor off antibiotics  Anxiety -prn Xanax  Code Status: Full Family Communication: sister at bedside Disposition Plan: Inpatient   Consultants:  GI  Procedures:  none  Antibiotics:  Rocephin started 05/04/2013  HPI/Subjective: Pt reports yellow eyes as reason for coming to hospital. She quit drinking 4 weeks ago due to nausea associated with a "change in the formula" of her usual beer. Her alcohol history consisted of 3-4 12 oz beers daily and a 12-pack of beers on the weekend for the past 40 years. She tried drinking other beer and alcoholic beverages but they consistently made her nauseous so she discontinued drinking.   Objective: Filed Vitals:   05/04/13 1612 05/04/13 1702 05/04/13 2045 05/05/13 0526  BP: 114/73 111/75 119/73 93/55  Pulse: 91  77 68  Temp: 98.5 F (36.9 C)  98.5 F (36.9 C) 98.3 F (36.8 C)  TempSrc: Oral  Oral Oral  Resp: 16 18 18 16   Height:   5\' 5"  (1.651 m)   Weight:   51.891 kg (114 lb 6.4 oz)   SpO2: 97% 99% 99% 97%    Intake/Output Summary (Last 24 hours) at 05/05/13 1122 Last data filed at 05/05/13 0900  Gross per 24 hour  Intake    120 ml  Output      0 ml  Net    120  ml   Filed Weights   05/04/13 2045  Weight: 51.891 kg (114 lb 6.4 oz)    Exam:   General:  Alert and oriented x 3 female in no apparent distress  HEENT: scleroicterus bilaterally  Cardiovascular: regular rate and rhythm, no murmurs, gallops, or rubs, no JVD  Respiratory: clear to auscultation bilaterally, no increased work of breathing  Abdomen: +BS, soft nontender, no distension, no masses or hepatosplenomegaly.   Musculoskeletal: no peripheral edema   Data Reviewed: Basic Metabolic Panel:  Recent Labs Lab 05/04/13 1618 05/05/13 0440  NA 138 138  K 3.4* 4.0  CL 100 107  CO2 26 22  GLUCOSE 112* 84  BUN 10 8  CREATININE 1.26* 0.93  CALCIUM 9.7 8.5   Liver Function Tests:  Recent Labs Lab 05/04/13 1618 05/05/13 0440  AST 3314* 2283*  ALT 2209* 1453*  ALKPHOS 371* 267*  BILITOT 19.6* 15.0*  PROT 6.4 4.6*  ALBUMIN 3.4* 2.6*    Recent Labs Lab 05/04/13 1618  LIPASE 48   No results found for this basename: AMMONIA,  in the last 168 hours CBC:  Recent Labs Lab 05/04/13 1618 05/05/13 0440  WBC 9.3 9.7  NEUTROABS 5.9  --   HGB 18.3* 15.0  HCT 50.6* 41.3  MCV 94.4 92.4  PLT 175 219   Cardiac Enzymes: No results found for this basename: CKTOTAL, CKMB, CKMBINDEX, TROPONINI,  in the last 168 hours BNP (last 3 results) No results found for this basename: PROBNP,  in the last  8760 hours CBG: No results found for this basename: GLUCAP,  in the last 168 hours  No results found for this or any previous visit (from the past 240 hour(s)).   Studies: US Abdomen Complete  05/04/2013   *RADIOLOGY REPORT*  Clinical Data:  Elevated liver function tests, EtOH abuse  COMPLETE ABDOMINAL ULTRASOUND  Comparison:  None.  Findings:  Gallbladder:  The gallbladder is contracted.  No gallstones are seen and there is no pain over the gallbladder with compression.  Common bile duct:  The common bile duct is normal measuring 2.6 mm in diameter.  Liver:  The liver has a  relatively normal echogenic appearance. The contours of the liver may be slightly nodular and early cirrhosis is a consideration.  No focal hepatic abnormality is seen.  IVC:  Appears normal.  Pancreas:  The pancreas is partially obscured by bowel gas.  Spleen:  The spleen is normal measuring 4.9 cm sagittally.  Right Kidney:  No hydronephrosis is seen.  The right kidney measures 9.3 cm sagittally.  Left Kidney:  No hydronephrosis is noted.  The left kidney measures 9.8 cm.  Abdominal aorta:  The abdominal aorta is normal in caliber.  IMPRESSION:  1.  Contracted gallbladder.  No gallstones or pain over the gallbladder. 2.  Slightly nodular contours of the liver may indicate mild cirrhosis.  No focal abnormality is seen. 3.  Portions of the pancreas are obscured by bowel gas.   Original Report Authenticated By: Dwyane Dee, M.D.    Scheduled Meds: . folic acid  1 mg Oral Daily  . thiamine  100 mg Oral Daily   Continuous Infusions: . sodium chloride 75 mL/hr at 05/05/13 1610    Principal Problem:   Jaundice Active Problems:   Transaminitis   ARF (acute renal failure)   UTI (lower urinary tract infection)    [name] PA-S   [facility] . If 7PM-7AM, please contact night-coverage at [email or web address], password [facility] 05/05/2013, 11:22 AM  LOS: 1 day   Attending Patient seen and examined,agree with the assessment and plan as outlined above.Not sure what the exact etiology of her transaminitis is, suspect it is still ETOH related, although she claims she quit drinking a month ago. Hep C antibody is positive, therefore will check a Viral Load  S Baylen Buckner

## 2013-05-06 ENCOUNTER — Encounter (HOSPITAL_COMMUNITY): Payer: Self-pay | Admitting: Radiology

## 2013-05-06 ENCOUNTER — Inpatient Hospital Stay (HOSPITAL_COMMUNITY): Payer: 59

## 2013-05-06 LAB — HEPATIC FUNCTION PANEL
AST: 3192 U/L — ABNORMAL HIGH (ref 0–37)
Albumin: 2.5 g/dL — ABNORMAL LOW (ref 3.5–5.2)
Total Bilirubin: 17.1 mg/dL — ABNORMAL HIGH (ref 0.3–1.2)

## 2013-05-06 LAB — PROTIME-INR: Prothrombin Time: 21.2 seconds — ABNORMAL HIGH (ref 11.6–15.2)

## 2013-05-06 MED ORDER — ENSURE COMPLETE PO LIQD
237.0000 mL | Freq: Two times a day (BID) | ORAL | Status: DC
  Administered 2013-05-06 – 2013-05-07 (×2): 237 mL via ORAL

## 2013-05-06 MED ORDER — IOHEXOL 300 MG/ML  SOLN
25.0000 mL | INTRAMUSCULAR | Status: AC
  Administered 2013-05-06: 25 mL via ORAL

## 2013-05-06 MED ORDER — IOHEXOL 300 MG/ML  SOLN
80.0000 mL | Freq: Once | INTRAMUSCULAR | Status: AC | PRN
  Administered 2013-05-06: 80 mL via INTRAVENOUS

## 2013-05-06 NOTE — Progress Notes (Signed)
INITIAL NUTRITION ASSESSMENT  DOCUMENTATION CODES Per approved criteria  -Not Applicable   INTERVENTION: 1. Ensure Complete po BID, each supplement provides 350 kcal and 13 grams of protein.   NUTRITION DIAGNOSIS: Inadequate oral intake related to decreased appetite as evidenced by poor meal completion.   Goal: >/=75% meal completion  Monitor:  PO intake, weight trends, labs, I/Os  Reason for Assessment: Consult   57 y.o. female  Admitting Dx: Jaundice  ASSESSMENT: Pt admitted with jaundice, found with Hep C. Has hx of alcohol abuse. Reportedly stopped drinking 4 weeks ago.  Discussed in rounds, pt not eating well at this time. Sister states pt has not been eating well for several weeks at home. Mostly only one meal per day. Unknown amount of weight loss. Pt sleeping soundly at time of RD visit. Would rouse to voice but quickly fall back to sleep with out answering any questions.  Family requesting nutrition supplements. RD will add Ensure Complete BID. Spoke with RN, provide Ensure with medications for increased consumption.   Nutrition Focused Physical Exam:  Subcutaneous Fat:  Orbital Region: WNL Upper Arm Region: WNL Thoracic and Lumbar Region: WNl  Muscle:  Temple Region: mild wasting Clavicle Bone Region: mild wasting Clavicle and Acromion Bone Region: WNL Scapular Bone Region: WNL Dorsal Hand: n/a Patellar Region: WNL Anterior Thigh Region: n/a Posterior Calf Region: WNL   Edema: n/a    Height: Ht Readings from Last 1 Encounters:  05/04/13 5\' 5"  (1.651 m)    Weight: Wt Readings from Last 1 Encounters:  05/04/13 114 lb 6.4 oz (51.891 kg)    Ideal Body Weight: 125 lbs   % Ideal Body Weight: 91%  Wt Readings from Last 10 Encounters:  05/04/13 114 lb 6.4 oz (51.891 kg)    Usual Body Weight: unknown   % Usual Body Weight: --  BMI:  Body mass index is 19.04 kg/(m^2). WNL  Estimated Nutritional Needs: Kcal: 1350-1550 Protein: 55-65 gm  Fluid:  1.4-1.6 L   Skin: intact   Diet Order: General  EDUCATION NEEDS: -No education needs identified at this time   Intake/Output Summary (Last 24 hours) at 05/06/13 1021 Last data filed at 05/06/13 0600  Gross per 24 hour  Intake    960 ml  Output      0 ml  Net    960 ml    Last BM: PTA   Labs:   Recent Labs Lab 05/04/13 1618 05/05/13 0440  NA 138 138  K 3.4* 4.0  CL 100 107  CO2 26 22  BUN 10 8  CREATININE 1.26* 0.93  CALCIUM 9.7 8.5  GLUCOSE 112* 84    CBG (last 3)  No results found for this basename: GLUCAP,  in the last 72 hours  Scheduled Meds: . folic acid  1 mg Oral Daily  . thiamine  100 mg Oral Daily    Continuous Infusions: . sodium chloride 75 mL/hr at 05/05/13 2316    History reviewed. No pertinent past medical history.  Past Surgical History  Procedure Laterality Date  . Appendectomy      Clarene Duke RD, LDN Pager [phone] After Hours pager [phone]

## 2013-05-06 NOTE — Progress Notes (Signed)
Pt attempting to drink contrast 

## 2013-05-06 NOTE — Progress Notes (Signed)
   CARE MANAGEMENT NOTE 05/06/2013  Patient:  Kara Bailey, Kara Bailey   Account Number:  [id number]  Date Initiated:  05/05/2013  Documentation initiated by:  [facility]  Subjective/Objective Assessment:   abd pain, UTI, hepatitis C     Action/Plan:   lives at home with dtr, [address] [phone]   Anticipated DC Date:  05/06/2013   Anticipated DC Plan:  HOME/SELF CARE  In-house referral  Financial Counselor      DC Planning Services  CM consult      Choice offered to / List presented to:             Status of service:  In process, will continue to follow Medicare Important Message given?   (If response is "NO", the following Medicare IM given date fields will be blank) Date Medicare IM given:   Date Additional Medicare IM given:    Discharge Disposition:    Per UR Regulation:    If discussed at Long Length of Stay Meetings, dates discussed:    Comments:  05/06/2013 1600 NCM spoke to sister, Tamela Gammon # [phone] or [phone]. Provided her with information on community resources such as SS Admin, DSS, and [employer]. 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 She feels she will not be able to return to work. Explained pt can follow up with SS Admin for disability benefits and speak to her PCP to assist with paperwork. Isidoro Donning RN CCM Case Mgmt phone [phone]  05/05/2013 1430 NCM spoke to pt and gave permission to speak to dtr or son. Dtr, Efraim Kaufmann reports pt goes to Dr [name]. Her out of pocket cost runs $25. She usually goes 2x per year. Pt states her out of pocket cost for her medications run $16 at [facility]. Isidoro Donning RN CCM Case Mgmt phone [phone]

## 2013-05-06 NOTE — Progress Notes (Signed)
[facility] Progress Note  Subjective: The patient has no specific complaints today.  Objective: Vital signs in last 24 hours: Temp:  [98.3 F (36.8 C)-99 F (37.2 C)] 98.6 F (37 C) (06/25 0513) Pulse Rate:  [69-82] 82 (06/25 0513) Resp:  [16] 16 (06/25 0513) BP: (110-130)/(66-77) 128/77 mmHg (06/25 0513) SpO2:  [96 %-99 %] 96 % (06/25 0513) Weight change:    PE  Scleral icterus   heart regular rhythm  Abdomen is soft and nontender  The AST, ALT, bilirubin, and prothrombin time have risen in 1 day and are now 3192, 1654, 17.1, and 21.2 respectively  Lab Results: Results for orders placed during the hospital encounter of 05/04/13 (from the past 24 hour(s))  PROTIME-INR     Status: Abnormal   Collection Time    05/05/13  6:23 PM      Result Value Range   Prothrombin Time 19.4 (*) 11.6 - 15.2 seconds   INR 1.70 (*) 0.00 - 1.49  PROTIME-INR     Status: Abnormal   Collection Time    05/06/13  5:43 AM      Result Value Range   Prothrombin Time 21.2 (*) 11.6 - 15.2 seconds   INR 1.92 (*) 0.00 - 1.49  HEPATIC FUNCTION PANEL     Status: Abnormal   Collection Time    05/06/13  5:43 AM      Result Value Range   Total Protein 4.6 (*) 6.0 - 8.3 g/dL   Albumin 2.5 (*) 3.5 - 5.2 g/dL   AST 1610 (*) 0 - 37 U/L   ALT 1654 (*) 0 - 35 U/L   Alkaline Phosphatase 277 (*) 39 - 117 U/L   Total Bilirubin 17.1 (*) 0.3 - 1.2 mg/dL   Bilirubin, Direct 96.0 (*) 0.0 - 0.3 mg/dL   Indirect Bilirubin 4.5 (*) 0.3 - 0.9 mg/dL    Studies/Results: @RISRSLT24 @    Assessment: #1. History of alcohol abuse.  #2. Rising elevated liver enzymes.  #3. Positive hepatitis C    Plan: It is unusual in alcoholic hepatitis to see transaminases this high. She also gives a history of not having had alcohol in the past month. This raises the question of some type of other acute liver injury going on. Sometimes ischemic liver injury might do this. . An acetaminophen level was not  elevated on admission. I'm going to recommend that we check an ANA just in case she might have an underlying not immune hepatitis. If LFTs continue to rise by tomorrow I would recommend referral to a tertiary referral center for a hepatology evaluation.    Sylvester Salonga F 05/06/2013, 1:20 PM  Lab Results  Component Value Date   HGB 15.0 05/05/2013   HGB 18.3* 05/04/2013   HGB 14.9 12/25/2007   HCT 41.3 05/05/2013   HCT 50.6* 05/04/2013   HCT 42.6 12/25/2007   ALKPHOS 277* 05/06/2013   ALKPHOS 267* 05/05/2013   ALKPHOS 272* 05/05/2013   ALKPHOS 371* 05/04/2013   AST 3192* 05/06/2013   AST 2283* 05/05/2013   AST 2366* 05/05/2013   AST 3314* 05/04/2013   ALT 1654* 05/06/2013   ALT 1453* 05/05/2013   ALT 1550* 05/05/2013   ALT 2209* 05/04/2013

## 2013-05-06 NOTE — Progress Notes (Signed)
Pt given and reminded contrast to drink for abd ct. Pt states she has no clock to check for time to drink, RN told pt she would remind her about time, RN reminded pt to drink contrast, pt states she is unable to drink the contrast.

## 2013-05-06 NOTE — Progress Notes (Signed)
[facility] PROGRESS NOTE  DIM MEISINGER ZOX:[id number] DOB: 07/18/1956 DOA: 05/04/2013 PCP: Benita Stabile, MD  HPI/Subjective: Pt is lethargic and has decreased appetite. She noted one episode of emesis yesterday, but denied hematemesis. She is continuing to have mild abdominal pain that is no different from her usual pain.   Assessment/Plan: Principle problem  Jaundice/transaminitis -secondary to hepatitis C and alcoholic hepatitis; positive HCV Ab 6/24;  -ordered viral load count -afebrile, no leukocytosis -LFTs and INR increased from yesterday  -albumin slightly decreased from yesterday; total protein is unchanged -continue monitoring LFTs, albumin, total protein, and PT/INR -acetaminophen level normal  - possibly early cirrhosis on abdominal ultrasound, no significant biliary or pancreatic abnormalities  - continue thiamine and folic acid, follow CIWA protocol  - await GI recommendations.  Active problems   Acute renal failure  -resolved with IVF   Urinary tract infection  - urine culture indicated multiple bacterial morphoctyes present, none predominant - Asymptomatic- no treatment at this time  - monitor off antibiotics   Anxiety  -prn Xanax  Code Status: Full Family Communication:  Sister and fiance Disposition Plan: Inpatient   Consultants:  GI  Procedures:  none  Antibiotics:  Rocephin d/c 6/23    Objective: Filed Vitals:   05/05/13 0526 05/05/13 1420 05/05/13 2047 05/06/13 0513  BP: 93/55 110/66 130/69 128/77  Pulse: 68 69 76 82  Temp: 98.3 F (36.8 C) 98.3 F (36.8 C) 99 F (37.2 C) 98.6 F (37 C)  TempSrc: Oral Oral Oral Oral  Resp: 16 16 16 16   Height:      Weight:      SpO2: 97% 99% 96% 96%    Intake/Output Summary (Last 24 hours) at 05/06/13 1119 Last data filed at 05/06/13 0600  Gross per 24 hour  Intake    960 ml  Output      0 ml  Net    960 ml   Filed Weights   05/04/13 2045  Weight: 51.891 kg (114 lb  6.4 oz)    Exam:   General:  Lethargic, slender female resting in bed, A&O x3, in NAD  HEENT: Eyes- icterus bilaterally   Cardiovascular: regular rate and rhythm, no murmurs, gallops, or rubs   Respiratory: clear to auscultation bilaterally, no increased work of breathing.   Abdomen: bowel sounds normal, soft, mild tenderness to deep palpation of RUQ and RLQ, no masses or hepatosplenomegaly   Data Reviewed: Basic Metabolic Panel:  Recent Labs Lab 05/04/13 1618 05/05/13 0440  NA 138 138  K 3.4* 4.0  CL 100 107  CO2 26 22  GLUCOSE 112* 84  BUN 10 8  CREATININE 1.26* 0.93  CALCIUM 9.7 8.5   Liver Function Tests:  Recent Labs Lab 05/04/13 1618 05/05/13 0440 05/06/13 0543  AST 3314* 2283*  2366* 3192*  ALT 2209* 1453*  1550* 1654*  ALKPHOS 371* 267*  272* 277*  BILITOT 19.6* 15.0*  14.8* 17.1*  PROT 6.4 4.6*  4.7* 4.6*  ALBUMIN 3.4* 2.6*  2.6* 2.5*    Recent Labs Lab 05/04/13 1618  LIPASE 48   No results found for this basename: AMMONIA,  in the last 168 hours CBC:  Recent Labs Lab 05/04/13 1618 05/05/13 0440  WBC 9.3 9.7  NEUTROABS 5.9  --   HGB 18.3* 15.0  HCT 50.6* 41.3  MCV 94.4 92.4  PLT 175 219   Cardiac Enzymes: No results found for this basename: CKTOTAL, CKMB, CKMBINDEX, TROPONINI,  in the last 168 hours BNP (last  3 results) No results found for this basename: PROBNP,  in the last 8760 hours CBG: No results found for this basename: GLUCAP,  in the last 168 hours  Recent Results (from the past 240 hour(s))  URINE CULTURE     Status: None   Collection Time    05/04/13  4:37 PM      Result Value Range Status   Specimen Description URINE, RANDOM   Final   Special Requests NONE   Final   Culture  Setup Time 05/04/2013 17:45   Final   Colony Count 60,000 COLONIES/ML   Final   Culture     Final   Value: Multiple bacterial morphotypes present, none predominant. Suggest appropriate recollection if clinically indicated.   Report  Status 05/05/2013 FINAL   Final     Studies: US Abdomen Complete  05/04/2013   *RADIOLOGY REPORT*  Clinical Data:  Elevated liver function tests, EtOH abuse  COMPLETE ABDOMINAL ULTRASOUND  Comparison:  None.  Findings:  Gallbladder:  The gallbladder is contracted.  No gallstones are seen and there is no pain over the gallbladder with compression.  Common bile duct:  The common bile duct is normal measuring 2.6 mm in diameter.  Liver:  The liver has a relatively normal echogenic appearance. The contours of the liver may be slightly nodular and early cirrhosis is a consideration.  No focal hepatic abnormality is seen.  IVC:  Appears normal.  Pancreas:  The pancreas is partially obscured by bowel gas.  Spleen:  The spleen is normal measuring 4.9 cm sagittally.  Right Kidney:  No hydronephrosis is seen.  The right kidney measures 9.3 cm sagittally.  Left Kidney:  No hydronephrosis is noted.  The left kidney measures 9.8 cm.  Abdominal aorta:  The abdominal aorta is normal in caliber.  IMPRESSION:  1.  Contracted gallbladder.  No gallstones or pain over the gallbladder. 2.  Slightly nodular contours of the liver may indicate mild cirrhosis.  No focal abnormality is seen. 3.  Portions of the pancreas are obscured by bowel gas.   Original Report Authenticated By: Dwyane Dee, M.D.    Scheduled Meds: . feeding supplement  237 mL Oral BID BM  . folic acid  1 mg Oral Daily  . thiamine  100 mg Oral Daily   Continuous Infusions: . sodium chloride 75 mL/hr at 05/05/13 2316    Principal Problem:   Jaundice Active Problems:   Transaminitis   ARF (acute renal failure)   UTI (lower urinary tract infection)    Time spent: 45 minutes    [name] PA-S   [facility] Pager [phone]. If 7PM-7AM, please contact night-coverage at [email or web address], password [facility] 05/06/2013, 11:19 AM  LOS: 2 days     Addendum  Patient seen and examined, chart and data base reviewed.  I agree with the above  assessment and plan.  For full details please see Mrs. Fabian Sharp PA-S note.  Jaundice/transaminitis, positive for hepatitis C. Await GI recommendations.   Clint Lipps, MD [facility] Pager: [phone] 05/06/2013, 1:26 PM

## 2013-05-07 DIAGNOSIS — K7200 Acute and subacute hepatic failure without coma: Secondary | ICD-10-CM | POA: Diagnosis present

## 2013-05-07 LAB — CBC
MCH: 33.6 pg (ref 26.0–34.0)
MCHC: 36.2 g/dL — ABNORMAL HIGH (ref 30.0–36.0)
MCV: 92.8 fL (ref 78.0–100.0)
Platelets: 116 10*3/uL — ABNORMAL LOW (ref 150–400)
RDW: 14.6 % (ref 11.5–15.5)
WBC: 9.6 10*3/uL (ref 4.0–10.5)

## 2013-05-07 LAB — COMPREHENSIVE METABOLIC PANEL
AST: 3096 U/L — ABNORMAL HIGH (ref 0–37)
Albumin: 2.5 g/dL — ABNORMAL LOW (ref 3.5–5.2)
Alkaline Phosphatase: 255 U/L — ABNORMAL HIGH (ref 39–117)
BUN: 5 mg/dL — ABNORMAL LOW (ref 6–23)
Chloride: 104 mEq/L (ref 96–112)
Potassium: 4.3 mEq/L (ref 3.5–5.1)
Total Bilirubin: 21 mg/dL (ref 0.3–1.2)

## 2013-05-07 LAB — PROTIME-INR: Prothrombin Time: 20.8 seconds — ABNORMAL HIGH (ref 11.6–15.2)

## 2013-05-07 MED ORDER — PENTOXIFYLLINE ER 400 MG PO TBCR
400.0000 mg | EXTENDED_RELEASE_TABLET | Freq: Three times a day (TID) | ORAL | Status: DC
  Administered 2013-05-07 – 2013-05-13 (×16): 400 mg via ORAL
  Filled 2013-05-07 (×21): qty 1

## 2013-05-07 MED ORDER — WHITE PETROLATUM GEL
Status: AC
  Administered 2013-05-07: 0.2
  Filled 2013-05-07: qty 5

## 2013-05-07 NOTE — Progress Notes (Signed)
[facility] PROGRESS NOTE  Kara Bailey JYN:[id number] DOB: July 29, 1956 DOA: 05/04/2013 PCP: Benita Stabile, MD   HPI/Subjective: Pt is still clinically asymptomatic. Unsure whether patient has truly abstained from alcohol for the past 4 weeks.   Assessment/Plan:  Principle problem  Jaundice/transaminitis   -multifactorial; possibly secondary to hepatitis C and/or alcoholic hepatitis -negative ANA; normal acetaminophen level -afebrile, no leukocytosis  -LFTs still elevated but stable; increased bilirubin -continue monitoring PT/INR -per GI, started on pentoxifylline -albumin slightly decreased from yesterday; total protein is unchanged  -possibly early cirrhosis on abdominal ultrasound, no significant biliary or pancreatic abnormalities -CT scan of abdomen/pelvis revealed no obvious liver injury or tumor -continue thiamine and folic acid, follow CIWA protocol  -continue monitoring; transfer to tertiary referral center not necessary at this time  Active problems  Acute renal failure  -resolved with IVF   Urinary tract infection  - urine culture indicated multiple bacterial morphoctyes present, none predominant  - Asymptomatic- no treatment at this time  - monitor off antibiotics   Anxiety  -prn Xanax   Code Status: Full  Family Communication: fiance, brother and sister Disposition Plan: Inpatient    Consultants:  GI  Procedures:  none  Antibiotics:  none   Objective: Filed Vitals:   05/06/13 0513 05/06/13 1448 05/06/13 2158 05/07/13 0503  BP: 128/77 111/77 137/81 127/78  Pulse: 82 75 75 77  Temp: 98.6 F (37 C) 98.6 F (37 C) 99.2 F (37.3 C) 98.8 F (37.1 C)  TempSrc: Oral Oral Oral Oral  Resp: 16 16 18 18   Height:      Weight:      SpO2: 96% 96% 96% 100%   No intake or output data in the 24 hours ending 05/07/13 1055 Filed Weights   05/04/13 2045  Weight: 51.891 kg (114 lb 6.4 oz)    Exam:  General: Lethargic, slender  female resting in bed, A&O x3, in NAD  HEENT: Eyes- icterus bilaterally  Cardiovascular: regular rate and rhythm, no murmurs, gallops, or rubs  Respiratory: clear to auscultation bilaterally, no increased work of breathing.  Abdomen: bowel sounds normal, soft, mild tenderness to deep palpation of suprapubic area, no masses or hepatosplenomegaly    Data Reviewed: Basic Metabolic Panel:  Recent Labs Lab 05/04/13 1618 05/05/13 0440 05/07/13 0600  NA 138 138 138  K 3.4* 4.0 4.3  CL 100 107 104  CO2 26 22 24   GLUCOSE 112* 84 71  BUN 10 8 5*  CREATININE 1.26* 0.93 0.60  CALCIUM 9.7 8.5 8.8   Liver Function Tests:  Recent Labs Lab 05/04/13 1618 05/05/13 0440 05/06/13 0543 05/07/13 0600  AST 3314* 2283*  2366* 3192* 3096*  ALT 2209* 1453*  1550* 1654* 1648*  ALKPHOS 371* 267*  272* 277* 255*  BILITOT 19.6* 15.0*  14.8* 17.1* 21.0*  PROT 6.4 4.6*  4.7* 4.6* 4.7*  ALBUMIN 3.4* 2.6*  2.6* 2.5* 2.5*    Recent Labs Lab 05/04/13 1618  LIPASE 48   No results found for this basename: AMMONIA,  in the last 168 hours CBC:  Recent Labs Lab 05/04/13 1618 05/05/13 0440 05/07/13 0600  WBC 9.3 9.7 9.6  NEUTROABS 5.9  --   --   HGB 18.3* 15.0 15.8*  HCT 50.6* 41.3 43.6  MCV 94.4 92.4 92.8  PLT 175 219 116*   Cardiac Enzymes: No results found for this basename: CKTOTAL, CKMB, CKMBINDEX, TROPONINI,  in the last 168 hours BNP (last 3 results) No results found for this basename: PROBNP,  in the last 8760 hours CBG: No results found for this basename: GLUCAP,  in the last 168 hours  Recent Results (from the past 240 hour(s))  URINE CULTURE     Status: None   Collection Time    05/04/13  4:37 PM      Result Value Range Status   Specimen Description URINE, RANDOM   Final   Special Requests NONE   Final   Culture  Setup Time 05/04/2013 17:45   Final   Colony Count 60,000 COLONIES/ML   Final   Culture     Final   Value: Multiple bacterial morphotypes present, none  predominant. Suggest appropriate recollection if clinically indicated.   Report Status 05/05/2013 FINAL   Final     Studies: Ct Abdomen Pelvis W Contrast  05/06/2013   *RADIOLOGY REPORT*  Clinical Data: 57 year old female abnormal liver enzymes with lower abdominal pain nausea.  CT ABDOMEN AND PELVIS WITH CONTRAST  Technique:  Multidetector CT imaging of the abdomen and pelvis was performed following the standard protocol during bolus administration of intravenous contrast.  Contrast: 80mL OMNIPAQUE IOHEXOL 300 MG/ML  SOLN  Comparison: abdominal ultrasound 05/04/2013.  Findings: Small or trace bilateral layering pleural effusions.  No pericardial effusion. No acute osseous abnormality identified. Small volume pelvic free fluid mostly in the cul-de-sac.  Negative uterus and adnexa.  Negative distal colon.  The bladder is distended.  The more proximal colon is somewhat under distended which is favored to explain the appearance of mild wall thickening. No pericolonic mesenteric stranding identified.  Oral contrast has now reached the terminal ileum.  No dilated small bowel.  Appendix not identified.  Mild motion artifact in the abdomen.  The stomach is decompressed.  Duodenum within normal limits.  The gallbladder remains decompressed.  There is evidence of some periportal edema/fluid also at the porta hepatis. Trace perihepatic free fluid. Small volume of fluid also in the gallbladder fossa. Liver enhancement within normal limits. No biliary ductal dilatation is identified.  Spleen, pancreas, adrenal glands, and portal venous system are within normal limits.  Major arterial structures in the abdomen pelvis are patent.  There is widespread atherosclerosis.  The kidneys appear nonobstructed.  Extrarenal pelves.  No hydroureter. Normal renal enhancement.  No lymphadenopathy.  IMPRESSION: 1.  Trace layering pleural effusions.  Nonspecific small volume of pelvic free fluid. 2.  Trace perihepatic and gallbladder fossa  free fluid.  Suggestion of mild periportal edema and fluid or stranding at the porta hepatis.  These are nonspecific.  Query hepatitis. 3.  Mild widespread colonic wall thickening is felt to be artifact due to underdistension.  No bowel obstruction. 4.  Distended bladder, consider Foley catheter placement.   Original Report Authenticated By: Erskine Speed, M.D.    Scheduled Meds: . feeding supplement  237 mL Oral BID BM  . folic acid  1 mg Oral Daily  . pentoxifylline  400 mg Oral TID WC  . thiamine  100 mg Oral Daily   Continuous Infusions: . sodium chloride 1,000 mL (05/06/13 1202)    Principal Problem:   Jaundice Active Problems:   Transaminitis   ARF (acute renal failure)   UTI (lower urinary tract infection)    Time spent: 35 minutes    [name] PA-S   [facility] Pager [phone]. If 7PM-7AM, please contact night-coverage at [email or web address], password [facility] 05/07/2013, 10:55 AM  LOS: 3 days    Addendum  Patient seen and examined, chart and data base reviewed.  I agree  with the above assessment and plan.  For full details please see Mrs. [name] PA-S note.  Discussed with Dr. Evette Cristal, Trental added, monitor and if LFTs are worsening she might need to be transferred to a tertiary center.   Clint Lipps, MD [facility] Pager: [phone] 05/07/2013, 4:02 PM

## 2013-05-07 NOTE — Progress Notes (Signed)
[facility] Progress Note  Subjective: The patient has no complaints. The CT scan of the abdomen and pelvis did not reveal any obvious liver injury or tumor.  Objective: Vital signs in last 24 hours: Temp:  [98.6 F (37 C)-99.2 F (37.3 C)] 98.8 F (37.1 C) (06/26 0503) Pulse Rate:  [75-77] 77 (06/26 0503) Resp:  [16-18] 18 (06/26 0503) BP: (111-137)/(77-81) 127/78 mmHg (06/26 0503) SpO2:  [96 %-100 %] 100 % (06/26 0503) Weight change:    PE:  Scleral icterus  Heart regular rhythm  Lungs clear  Abdomen soft and nontender  Lab Results: Results for orders placed during the hospital encounter of 05/04/13 (from the past 24 hour(s))  PROTIME-INR     Status: Abnormal   Collection Time    05/07/13  6:00 AM      Result Value Range   Prothrombin Time 20.8 (*) 11.6 - 15.2 seconds   INR 1.85 (*) 0.00 - 1.49  COMPREHENSIVE METABOLIC PANEL     Status: Abnormal   Collection Time    05/07/13  6:00 AM      Result Value Range   Sodium 138  135 - 145 mEq/L   Potassium 4.3  3.5 - 5.1 mEq/L   Chloride 104  96 - 112 mEq/L   CO2 24  19 - 32 mEq/L   Glucose, Bld 71  70 - 99 mg/dL   BUN 5 (*) 6 - 23 mg/dL   Creatinine, Ser 1.61  0.50 - 1.10 mg/dL   Calcium 8.8  8.4 - 09.6 mg/dL   Total Protein 4.7 (*) 6.0 - 8.3 g/dL   Albumin 2.5 (*) 3.5 - 5.2 g/dL   AST 0454 (*) 0 - 37 U/L   ALT 1648 (*) 0 - 35 U/L   Alkaline Phosphatase 255 (*) 39 - 117 U/L   Total Bilirubin 21.0 (*) 0.3 - 1.2 mg/dL   GFR calc non Af Amer >90  >90 mL/min   GFR calc Af Amer >90  >90 mL/min  CBC     Status: Abnormal   Collection Time    05/07/13  6:00 AM      Result Value Range   WBC 9.6  4.0 - 10.5 K/uL   RBC 4.70  3.87 - 5.11 MIL/uL   Hemoglobin 15.8 (*) 12.0 - 15.0 g/dL   HCT 09.8  11.9 - 14.7 %   MCV 92.8  78.0 - 100.0 fL   MCH 33.6  26.0 - 34.0 pg   MCHC 36.2 (*) 30.0 - 36.0 g/dL   RDW 82.9  56.2 - 13.0 %   Platelets 116 (*) 150 - 400 K/uL     Studies/Results: @RISRSLT24 @    Assessment: Elevated liver enzymes in a setting of chronic alcohol use and positive hepatitis C. Although the patient states that she has not drank alcohol in the month it is unclear at this time whether or or not to believe that statement. Given the fact that this could possibly be alcoholic hepatitis and her Maddrey Score a couple of days ago was 36 she may benefit from treatment. Either prednisolone or pentoxifylline have been shown to possibly be effective. Given the safety profile of pentoxifylline I will go ahead and start her on 400 mg by mouth 3 times a day for 28 days. We will need to continue to follow her LFTs. It appears that her liver enzymes have stabilized overnight although they are still elevated. I think we can hold for now at least on transfer to  a tertiary referral center.  Plan: See above    Graylin Shiver 05/07/2013, 10:03 AM  Lab Results  Component Value Date   HGB 15.8* 05/07/2013   HGB 15.0 05/05/2013   HGB 18.3* 05/04/2013   HCT 43.6 05/07/2013   HCT 41.3 05/05/2013   HCT 50.6* 05/04/2013   ALKPHOS 255* 05/07/2013   ALKPHOS 277* 05/06/2013   ALKPHOS 267* 05/05/2013   ALKPHOS 272* 05/05/2013   AST 3096* 05/07/2013   AST 3192* 05/06/2013   AST 2283* 05/05/2013   AST 2366* 05/05/2013   ALT 1648* 05/07/2013   ALT 1654* 05/06/2013   ALT 1453* 05/05/2013   ALT 1550* 05/05/2013

## 2013-05-07 NOTE — Progress Notes (Signed)
Pt vomiting. Notified np for additional orders. Will continue to monitor pt.

## 2013-05-07 NOTE — Progress Notes (Signed)
Pt had a critical lab result of a total Bilirubin of 21. rn paged md and is awaiting further orders.

## 2013-05-08 LAB — HEPATIC FUNCTION PANEL
Alkaline Phosphatase: 224 U/L — ABNORMAL HIGH (ref 39–117)
Bilirubin, Direct: 15.6 mg/dL — ABNORMAL HIGH (ref 0.0–0.3)
Indirect Bilirubin: 4.8 mg/dL
Total Bilirubin: 20.4 mg/dL (ref 0.3–1.2)

## 2013-05-08 MED ORDER — PROMETHAZINE HCL 25 MG PO TABS: 12.5000 mg | ORAL_TABLET | Freq: Two times a day (BID) | ORAL | Status: DC | PRN

## 2013-05-08 MED ORDER — PROMETHAZINE HCL 25 MG/ML IJ SOLN
12.5000 mg | Freq: Four times a day (QID) | INTRAMUSCULAR | Status: DC | PRN
  Administered 2013-05-08: 14:00:00 via INTRAVENOUS
  Administered 2013-05-09 – 2013-05-12 (×2): 12.5 mg via INTRAVENOUS
  Filled 2013-05-08 (×5): qty 1

## 2013-05-08 MED ORDER — MORPHINE SULFATE 2 MG/ML IJ SOLN
1.0000 mg | INTRAMUSCULAR | Status: DC | PRN
  Administered 2013-05-09 – 2013-05-11 (×8): 2 mg via INTRAVENOUS
  Filled 2013-05-08: qty 2
  Filled 2013-05-08 (×7): qty 1

## 2013-05-08 MED ORDER — MORPHINE SULFATE 2 MG/ML IJ SOLN
INTRAMUSCULAR | Status: AC
  Administered 2013-05-08: 2 mg via INTRAVENOUS
  Filled 2013-05-08: qty 1

## 2013-05-08 NOTE — Progress Notes (Signed)
20.4 for a total bilirubin. rn to page md and await further orders

## 2013-05-08 NOTE — Progress Notes (Signed)
[facility] PROGRESS NOTE  Kara Bailey NWG:[id number] DOB: October 06, 1956 DOA: 05/04/2013 PCP: Benita Stabile, MD   HPI/Subjective: Vomited twice last night, denies an abdominal pain. Grayish bowel movements  Assessment/Plan:  Principle problem  Jaundice/transaminitis   -multifactorial; possibly secondary to hepatitis C and/or alcoholic hepatitis -negative ANA; normal acetaminophen level -afebrile, no leukocytosis  -LFTs still elevated but stable; increased bilirubin -continue monitoring PT/INR -per GI, started on pentoxifylline -albumin slightly decreased from yesterday; total protein is unchanged  -possibly early cirrhosis on abdominal ultrasound, no significant biliary or pancreatic abnormalities -CT scan of abdomen/pelvis revealed no obvious liver injury or tumor -continue thiamine and folic acid, follow CIWA protocol  -continue monitoring; transfer to tertiary referral center not necessary at this time  Active problems  Acute renal failure  -resolved with IVF   Urinary tract infection  - urine culture indicated multiple bacterial morphoctyes present, none predominant  - Asymptomatic- no treatment at this time  - monitor off antibiotics   Anxiety  -prn Xanax   Code Status: Full  Family Communication: fiance, brother and sister Disposition Plan: Inpatient    Consultants:  GI  Procedures:  none  Antibiotics:  none   Objective: Filed Vitals:   05/07/13 1419 05/07/13 2114 05/07/13 2151 05/08/13 0434  BP: 111/68 113/72 117/66 104/58  Pulse: 76 84 78 67  Temp: 98.8 F (37.1 C) 98.4 F (36.9 C) 98.2 F (36.8 C) 97.7 F (36.5 C)  TempSrc: Oral Oral Oral Oral  Resp: 16 16 16 18   Height:      Weight:      SpO2: 94% 94% 94% 99%    Intake/Output Summary (Last 24 hours) at 05/08/13 1144 Last data filed at 05/08/13 0547  Gross per 24 hour  Intake 3633.75 ml  Output      0 ml  Net 3633.75 ml   Filed Weights   05/04/13 2045  Weight:  51.891 kg (114 lb 6.4 oz)    Exam:  General: Lethargic, slender female resting in bed, A&O x3, in NAD  HEENT: Eyes- icterus bilaterally  Cardiovascular: regular rate and rhythm, no murmurs, gallops, or rubs  Respiratory: clear to auscultation bilaterally, no increased work of breathing.  Abdomen: bowel sounds normal, soft, mild tenderness to deep palpation of suprapubic area, no masses or hepatosplenomegaly    Data Reviewed: Basic Metabolic Panel:  Recent Labs Lab 05/04/13 1618 05/05/13 0440 05/07/13 0600  NA 138 138 138  K 3.4* 4.0 4.3  CL 100 107 104  CO2 26 22 24   GLUCOSE 112* 84 71  BUN 10 8 5*  CREATININE 1.26* 0.93 0.60  CALCIUM 9.7 8.5 8.8   Liver Function Tests:  Recent Labs Lab 05/04/13 1618 05/05/13 0440 05/06/13 0543 05/07/13 0600 05/08/13 0530  AST 3314* 2283*  2366* 3192* 3096* 2082*  ALT 2209* 1453*  1550* 1654* 1648* 1263*  ALKPHOS 371* 267*  272* 277* 255* 224*  BILITOT 19.6* 15.0*  14.8* 17.1* 21.0* 20.4*  PROT 6.4 4.6*  4.7* 4.6* 4.7* 4.5*  ALBUMIN 3.4* 2.6*  2.6* 2.5* 2.5* 2.4*    Recent Labs Lab 05/04/13 1618  LIPASE 48   No results found for this basename: AMMONIA,  in the last 168 hours CBC:  Recent Labs Lab 05/04/13 1618 05/05/13 0440 05/07/13 0600  WBC 9.3 9.7 9.6  NEUTROABS 5.9  --   --   HGB 18.3* 15.0 15.8*  HCT 50.6* 41.3 43.6  MCV 94.4 92.4 92.8  PLT 175 219 116*   Cardiac Enzymes:  No results found for this basename: CKTOTAL, CKMB, CKMBINDEX, TROPONINI,  in the last 168 hours BNP (last 3 results) No results found for this basename: PROBNP,  in the last 8760 hours CBG: No results found for this basename: GLUCAP,  in the last 168 hours  Recent Results (from the past 240 hour(s))  URINE CULTURE     Status: None   Collection Time    05/04/13  4:37 PM      Result Value Range Status   Specimen Description URINE, RANDOM   Final   Special Requests NONE   Final   Culture  Setup Time 05/04/2013 17:45   Final    Colony Count 60,000 COLONIES/ML   Final   Culture     Final   Value: Multiple bacterial morphotypes present, none predominant. Suggest appropriate recollection if clinically indicated.   Report Status 05/05/2013 FINAL   Final     Studies: Ct Abdomen Pelvis W Contrast  05/06/2013   *RADIOLOGY REPORT*  Clinical Data: 57 year old female abnormal liver enzymes with lower abdominal pain nausea.  CT ABDOMEN AND PELVIS WITH CONTRAST  Technique:  Multidetector CT imaging of the abdomen and pelvis was performed following the standard protocol during bolus administration of intravenous contrast.  Contrast: 80mL OMNIPAQUE IOHEXOL 300 MG/ML  SOLN  Comparison: abdominal ultrasound 05/04/2013.  Findings: Small or trace bilateral layering pleural effusions.  No pericardial effusion. No acute osseous abnormality identified. Small volume pelvic free fluid mostly in the cul-de-sac.  Negative uterus and adnexa.  Negative distal colon.  The bladder is distended.  The more proximal colon is somewhat under distended which is favored to explain the appearance of mild wall thickening. No pericolonic mesenteric stranding identified.  Oral contrast has now reached the terminal ileum.  No dilated small bowel.  Appendix not identified.  Mild motion artifact in the abdomen.  The stomach is decompressed.  Duodenum within normal limits.  The gallbladder remains decompressed.  There is evidence of some periportal edema/fluid also at the porta hepatis. Trace perihepatic free fluid. Small volume of fluid also in the gallbladder fossa. Liver enhancement within normal limits. No biliary ductal dilatation is identified.  Spleen, pancreas, adrenal glands, and portal venous system are within normal limits.  Major arterial structures in the abdomen pelvis are patent.  There is widespread atherosclerosis.  The kidneys appear nonobstructed.  Extrarenal pelves.  No hydroureter. Normal renal enhancement.  No lymphadenopathy.  IMPRESSION: 1.  Trace  layering pleural effusions.  Nonspecific small volume of pelvic free fluid. 2.  Trace perihepatic and gallbladder fossa free fluid.  Suggestion of mild periportal edema and fluid or stranding at the porta hepatis.  These are nonspecific.  Query hepatitis. 3.  Mild widespread colonic wall thickening is felt to be artifact due to underdistension.  No bowel obstruction. 4.  Distended bladder, consider Foley catheter placement.   Original Report Authenticated By: Erskine Speed, M.D.    Scheduled Meds: . feeding supplement  237 mL Oral BID BM  . folic acid  1 mg Oral Daily  . pentoxifylline  400 mg Oral TID WC  . thiamine  100 mg Oral Daily   Continuous Infusions: . sodium chloride 75 mL/hr at 05/08/13 0547    Principal Problem:   Acute hepatic failure Active Problems:   Jaundice   Transaminitis   ARF (acute renal failure)   UTI (lower urinary tract infection)    Time spent: 35 minutes    Jakeel Starliper A, MD   [facility] Pager  [phone]. If 7PM-7AM, please contact night-coverage at [email or web address], password [facility] 05/08/2013, 11:44 AM  LOS: 4 days

## 2013-05-08 NOTE — Progress Notes (Signed)
[facility] Progress Note  Subjective: No specific complaints today  Objective: Vital signs in last 24 hours: Temp:  [97.7 F (36.5 C)-98.8 F (37.1 C)] 97.7 F (36.5 C) (06/27 0434) Pulse Rate:  [67-84] 67 (06/27 0434) Resp:  [16-18] 18 (06/27 0434) BP: (104-117)/(58-72) 104/58 mmHg (06/27 0434) SpO2:  [94 %-99 %] 99 % (06/27 0434) Weight change:    PE:  Scleral icterus  Abdomen soft nontender  Lab Results: Results for orders placed during the hospital encounter of 05/04/13 (from the past 24 hour(s))  PROTIME-INR     Status: Abnormal   Collection Time    05/08/13  5:30 AM      Result Value Range   Prothrombin Time 20.6 (*) 11.6 - 15.2 seconds   INR 1.83 (*) 0.00 - 1.49  HEPATIC FUNCTION PANEL     Status: Abnormal   Collection Time    05/08/13  5:30 AM      Result Value Range   Total Protein 4.5 (*) 6.0 - 8.3 g/dL   Albumin 2.4 (*) 3.5 - 5.2 g/dL   AST 1610 (*) 0 - 37 U/L   ALT 1263 (*) 0 - 35 U/L   Alkaline Phosphatase 224 (*) 39 - 117 U/L   Total Bilirubin 20.4 (*) 0.3 - 1.2 mg/dL   Bilirubin, Direct >96.0 (*) 0.0 - 0.3 mg/dL   Indirect Bilirubin NOT CALCULATED  0.3 - 0.9 mg/dL    Studies/Results: @RISRSLT24 @    Assessment: 57 year old female with elevated liver enzymes most likely secondary to a combination of factors including hepatitis C, and alcohol hepatitis. Her transaminases have improved overnight. She was started on Trental yesterday because of the presumed alcoholic hepatitis.  Plan: Continue supportive care. Follow LFTs. Continue TrentalHerbert Moors F 05/08/2013, 9:44 AM

## 2013-05-09 LAB — HEPATIC FUNCTION PANEL
ALT: 985 U/L — ABNORMAL HIGH (ref 0–35)
Bilirubin, Direct: 14.4 mg/dL — ABNORMAL HIGH (ref 0.0–0.3)
Indirect Bilirubin: 5.2 mg/dL — ABNORMAL HIGH (ref 0.3–0.9)
Total Bilirubin: 19.6 mg/dL (ref 0.3–1.2)

## 2013-05-09 LAB — PROTIME-INR: INR: 1.58 — ABNORMAL HIGH (ref 0.00–1.49)

## 2013-05-09 MED ORDER — POLYETHYLENE GLYCOL 3350 17 G PO PACK
17.0000 g | PACK | Freq: Every day | ORAL | Status: DC
  Administered 2013-05-09 – 2013-05-12 (×4): 17 g via ORAL
  Filled 2013-05-09 (×5): qty 1

## 2013-05-09 MED ORDER — FLEET ENEMA 7-19 GM/118ML RE ENEM
1.0000 | ENEMA | Freq: Every day | RECTAL | Status: DC | PRN
  Filled 2013-05-09: qty 1

## 2013-05-09 NOTE — Progress Notes (Signed)
[facility] PROGRESS NOTE  Kara Bailey ZOX:[id number] DOB: 30-Aug-1956 DOA: 05/04/2013 PCP: Benita Stabile, MD   HPI/Subjective: Continued to have abdominal pain. But improving. LFTs are better the  Assessment/Plan:  Principle problem  Jaundice/transaminitis   -multifactorial; possibly secondary to hepatitis C and alcoholic hepatitis -negative ANA; normal acetaminophen level -afebrile, no leukocytosis  -LFTs still elevated but stable; increased bilirubin -continue monitoring PT/INR -per GI, started on pentoxifylline -albumin slightly decreased from yesterday; total protein is unchanged  -possibly early cirrhosis on abdominal ultrasound, no significant biliary or pancreatic abnormalities -CT scan of abdomen/pelvis revealed no obvious liver injury or tumor -continue thiamine and folic acid, follow CIWA protocol  -continue monitoring; transfer to tertiary referral center not necessary at this time  Active problems  Acute renal failure  -resolved with IVF   Urinary tract infection  - urine culture indicated multiple bacterial morphoctyes present, none predominant  - Asymptomatic- no treatment at this time  - monitor off antibiotics   Anxiety  -prn Xanax   Code Status: Full  Family Communication: fiance, brother and sister Disposition Plan: Inpatient    Consultants:  GI  Procedures:  none  Antibiotics:  none   Objective: Filed Vitals:   05/08/13 0434 05/08/13 1400 05/08/13 2042 05/09/13 0622  BP: 104/58 118/72 102/65 118/58  Pulse: 67 77 76 75  Temp: 97.7 F (36.5 C) 97.6 F (36.4 C) 98 F (36.7 C) 98.1 F (36.7 C)  TempSrc: Oral Oral Rectal Oral  Resp: 18 18 18 16   Height:      Weight:      SpO2: 99% 98% 98% 100%    Intake/Output Summary (Last 24 hours) at 05/09/13 1258 Last data filed at 05/09/13 1100  Gross per 24 hour  Intake    118 ml  Output      0 ml  Net    118 ml   Filed Weights   05/04/13 2045  Weight: 51.891 kg (114  lb 6.4 oz)    Exam:  General: Lethargic, slender female resting in bed, A&O x3, in NAD  HEENT: Eyes- icterus bilaterally  Cardiovascular: regular rate and rhythm, no murmurs, gallops, or rubs  Respiratory: clear to auscultation bilaterally, no increased work of breathing.  Abdomen: bowel sounds normal, soft, mild tenderness to deep palpation of suprapubic area, no masses or hepatosplenomegaly    Data Reviewed: Basic Metabolic Panel:  Recent Labs Lab 05/04/13 1618 05/05/13 0440 05/07/13 0600  NA 138 138 138  K 3.4* 4.0 4.3  CL 100 107 104  CO2 26 22 24   GLUCOSE 112* 84 71  BUN 10 8 5*  CREATININE 1.26* 0.93 0.60  CALCIUM 9.7 8.5 8.8   Liver Function Tests:  Recent Labs Lab 05/05/13 0440 05/06/13 0543 05/07/13 0600 05/08/13 0530 05/09/13 0458  AST 2283*  2366* 3192* 3096* 2082* 1195*  ALT 1453*  1550* 1654* 1648* 1263* 985*  ALKPHOS 267*  272* 277* 255* 224* 203*  BILITOT 15.0*  14.8* 17.1* 21.0* 20.4* 19.6*  PROT 4.6*  4.7* 4.6* 4.7* 4.5* 4.5*  ALBUMIN 2.6*  2.6* 2.5* 2.5* 2.4* 2.3*    Recent Labs Lab 05/04/13 1618  LIPASE 48   No results found for this basename: AMMONIA,  in the last 168 hours CBC:  Recent Labs Lab 05/04/13 1618 05/05/13 0440 05/07/13 0600  WBC 9.3 9.7 9.6  NEUTROABS 5.9  --   --   HGB 18.3* 15.0 15.8*  HCT 50.6* 41.3 43.6  MCV 94.4 92.4 92.8  PLT 175  219 116*   Cardiac Enzymes: No results found for this basename: CKTOTAL, CKMB, CKMBINDEX, TROPONINI,  in the last 168 hours BNP (last 3 results) No results found for this basename: PROBNP,  in the last 8760 hours CBG: No results found for this basename: GLUCAP,  in the last 168 hours  Recent Results (from the past 240 hour(s))  URINE CULTURE     Status: None   Collection Time    05/04/13  4:37 PM      Result Value Range Status   Specimen Description URINE, RANDOM   Final   Special Requests NONE   Final   Culture  Setup Time 05/04/2013 17:45   Final   Colony Count  60,000 COLONIES/ML   Final   Culture     Final   Value: Multiple bacterial morphotypes present, none predominant. Suggest appropriate recollection if clinically indicated.   Report Status 05/05/2013 FINAL   Final     Studies: No results found.  Scheduled Meds: . feeding supplement  237 mL Oral BID BM  . folic acid  1 mg Oral Daily  . pentoxifylline  400 mg Oral TID WC  . thiamine  100 mg Oral Daily   Continuous Infusions: . sodium chloride 75 mL/hr at 05/09/13 1118    Principal Problem:   Acute hepatic failure Active Problems:   Jaundice   Transaminitis   ARF (acute renal failure)   UTI (lower urinary tract infection)    Time spent: 35 minutes    Abdirahman Chittum A, MD   [facility] Pager [phone]. If 7PM-7AM, please contact night-coverage at [email or web address], password [facility] 05/09/2013, 12:58 PM  LOS: 5 days

## 2013-05-09 NOTE — Progress Notes (Signed)
[facility] Progress Note  Subjective: Complaining of diffuse abdominal pain which she states she's had since she's been in the hospital  Objective: Vital signs in last 24 hours: Temp:  [97.6 F (36.4 C)-98.1 F (36.7 C)] 98.1 F (36.7 C) (06/28 0622) Pulse Rate:  [75-77] 75 (06/28 0622) Resp:  [16-18] 16 (06/28 0622) BP: (102-118)/(58-72) 118/58 mmHg (06/28 0622) SpO2:  [98 %-100 %] 100 % (06/28 0622) Weight change:    UE:AVWUJ, markedly icteric  Lab Results: Results for orders placed during the hospital encounter of 05/04/13 (from the past 24 hour(s))  PROTIME-INR     Status: Abnormal   Collection Time    05/09/13  4:58 AM      Result Value Range   Prothrombin Time 18.4 (*) 11.6 - 15.2 seconds   INR 1.58 (*) 0.00 - 1.49  HEPATIC FUNCTION PANEL     Status: Abnormal   Collection Time    05/09/13  4:58 AM      Result Value Range   Total Protein 4.5 (*) 6.0 - 8.3 g/dL   Albumin 2.3 (*) 3.5 - 5.2 g/dL   AST 8119 (*) 0 - 37 U/L   ALT 985 (*) 0 - 35 U/L   Alkaline Phosphatase 203 (*) 39 - 117 U/L   Total Bilirubin 19.6 (*) 0.3 - 1.2 mg/dL   Bilirubin, Direct 14.7 (*) 0.0 - 0.3 mg/dL   Indirect Bilirubin 5.2 (*) 0.3 - 0.9 mg/dL    Studies/Results: No results found.    Assessment: Alcoholic hepatitis currently seems to be doing somewhat better started on Trental . LFTs falling  Plan: Continue trental. Follow clinically and monitor LFTs    Fallou Hulbert C 05/09/2013, 11:33 AM

## 2013-05-10 ENCOUNTER — Encounter (HOSPITAL_COMMUNITY): Payer: Self-pay | Admitting: Internal Medicine

## 2013-05-10 LAB — HEPATIC FUNCTION PANEL
Alkaline Phosphatase: 220 U/L — ABNORMAL HIGH (ref 39–117)
Bilirubin, Direct: 16.4 mg/dL — ABNORMAL HIGH (ref 0.0–0.3)
Indirect Bilirubin: 10.4 mg/dL — ABNORMAL HIGH (ref 0.3–0.9)
Total Bilirubin: 26.8 mg/dL (ref 0.3–1.2)
Total Protein: 5.5 g/dL — ABNORMAL LOW (ref 6.0–8.3)

## 2013-05-10 NOTE — Progress Notes (Signed)
[facility] Progress Note  Subjective: Abdominal pain nausea and both better today  Objective: Vital signs in last 24 hours: Temp:  [98.1 F (36.7 C)-98.9 F (37.2 C)] 98.1 F (36.7 C) (06/29 0505) Pulse Rate:  [66-74] 66 (06/29 0505) Resp:  [16-20] 16 (06/29 0505) BP: (109-131)/(60-74) 128/60 mmHg (06/29 0505) SpO2:  [96 %-100 %] 97 % (06/29 0505) Weight change:    PE: Icteric as before  Lab Results: Results for orders placed during the hospital encounter of 05/04/13 (from the past 24 hour(s))  PROTIME-INR     Status: Abnormal   Collection Time    05/10/13  6:30 AM      Result Value Range   Prothrombin Time 16.6 (*) 11.6 - 15.2 seconds   INR 1.38  0.00 - 1.49  HEPATIC FUNCTION PANEL     Status: Abnormal   Collection Time    05/10/13  6:30 AM      Result Value Range   Total Protein 5.5 (*) 6.0 - 8.3 g/dL   Albumin 2.8 (*) 3.5 - 5.2 g/dL   AST 045 (*) 0 - 37 U/L   ALT 888 (*) 0 - 35 U/L   Alkaline Phosphatase 220 (*) 39 - 117 U/L   Total Bilirubin 26.8 (*) 0.3 - 1.2 mg/dL   Bilirubin, Direct 40.9 (*) 0.0 - 0.3 mg/dL   Indirect Bilirubin 81.1 (*) 0.3 - 0.9 mg/dL    Studies/Results: No results found.    Assessment: Alcoholic hepatitis currently being treated with pentoxifylline, transaminases improving but bilirubin lagging which is somewhat typical Positive hepatitis C antibody, hepatitis C RNA was ordered but canceled  Plan:  Continue to advance diet. Will check hepatitis C RNA although she will not be a candidate for treatment unless abstained from alcohol.    Taaliyah Delpriore C 05/10/2013, 10:01 AM

## 2013-05-10 NOTE — Progress Notes (Signed)
[facility] PROGRESS NOTE  Kara Bailey XBJ:[id number] DOB: 1956/07/25 DOA: 05/04/2013 PCP: Benita Stabile, MD   HPI/Subjective: Feels much better, abdominal pain is better LFTs are improving.  Assessment/Plan:  Principle problem   Alcoholic hepatitis with background of chronic hepatitis C  -Jaundice/transaminitis  -multifactorial; possibly secondary to hepatitis C and alcoholic hepatitis -negative ANA; normal acetaminophen level -afebrile, no leukocytosis  -LFTs still elevated but stable; increased bilirubin -continue monitoring PT/INR -per GI, started on pentoxifylline -possibly early cirrhosis on abdominal ultrasound, no significant biliary or pancreatic abnormalities -CT scan of abdomen/pelvis revealed no obvious liver injury or tumor -continue thiamine and folic acid, follow CIWA protocol   -Continues to improve AST/ALT, alkaline phosphatase and INR improving steadily. -Total bilirubin still high, per GI this is typically lags behind other LFTs.   Active problems  Acute renal failure  -resolved with IVF   Urinary tract infection  - urine culture indicated multiple bacterial morphoctyes present, none predominant  - Asymptomatic- no treatment at this time  - monitor off antibiotics   Anxiety  -prn Xanax   Code Status: Full  Family Communication: fiance, brother and sister Disposition Plan: Inpatient    Consultants:  GI  Procedures:  none  Antibiotics:  none   Objective: Filed Vitals:   05/09/13 0622 05/09/13 1400 05/09/13 2200 05/10/13 0505  BP: 118/58 109/62 131/74 128/60  Pulse: 75 74 69 66  Temp: 98.1 F (36.7 C) 98.9 F (37.2 C) 98.4 F (36.9 C) 98.1 F (36.7 C)  TempSrc: Oral Oral Oral Oral  Resp: 16 18 20 16   Height:      Weight:      SpO2: 100% 100% 96% 97%    Intake/Output Summary (Last 24 hours) at 05/10/13 1029 Last data filed at 05/10/13 3086  Gross per 24 hour  Intake 996.75 ml  Output      0 ml  Net 996.75  ml   Filed Weights   05/04/13 2045  Weight: 51.891 kg (114 lb 6.4 oz)    Exam:  General: Lethargic, slender female resting in bed, A&O x3, in NAD  HEENT: Eyes- icterus bilaterally  Cardiovascular: regular rate and rhythm, no murmurs, gallops, or rubs  Respiratory: clear to auscultation bilaterally, no increased work of breathing.  Abdomen: bowel sounds normal, soft, mild tenderness to deep palpation of suprapubic area, no masses or hepatosplenomegaly    Data Reviewed: Basic Metabolic Panel:  Recent Labs Lab 05/04/13 1618 05/05/13 0440 05/07/13 0600  NA 138 138 138  K 3.4* 4.0 4.3  CL 100 107 104  CO2 26 22 24   GLUCOSE 112* 84 71  BUN 10 8 5*  CREATININE 1.26* 0.93 0.60  CALCIUM 9.7 8.5 8.8   Liver Function Tests:  Recent Labs Lab 05/06/13 0543 05/07/13 0600 05/08/13 0530 05/09/13 0458 05/10/13 0630  AST 3192* 3096* 2082* 1195* 835*  ALT 1654* 1648* 1263* 985* 888*  ALKPHOS 277* 255* 224* 203* 220*  BILITOT 17.1* 21.0* 20.4* 19.6* 26.8*  PROT 4.6* 4.7* 4.5* 4.5* 5.5*  ALBUMIN 2.5* 2.5* 2.4* 2.3* 2.8*    Recent Labs Lab 05/04/13 1618  LIPASE 48   No results found for this basename: AMMONIA,  in the last 168 hours CBC:  Recent Labs Lab 05/04/13 1618 05/05/13 0440 05/07/13 0600  WBC 9.3 9.7 9.6  NEUTROABS 5.9  --   --   HGB 18.3* 15.0 15.8*  HCT 50.6* 41.3 43.6  MCV 94.4 92.4 92.8  PLT 175 219 116*   Cardiac Enzymes: No  results found for this basename: CKTOTAL, CKMB, CKMBINDEX, TROPONINI,  in the last 168 hours BNP (last 3 results) No results found for this basename: PROBNP,  in the last 8760 hours CBG: No results found for this basename: GLUCAP,  in the last 168 hours  Recent Results (from the past 240 hour(s))  URINE CULTURE     Status: None   Collection Time    05/04/13  4:37 PM      Result Value Range Status   Specimen Description URINE, RANDOM   Final   Special Requests NONE   Final   Culture  Setup Time 05/04/2013 17:45   Final    Colony Count 60,000 COLONIES/ML   Final   Culture     Final   Value: Multiple bacterial morphotypes present, none predominant. Suggest appropriate recollection if clinically indicated.   Report Status 05/05/2013 FINAL   Final     Studies: No results found.  Scheduled Meds: . feeding supplement  237 mL Oral BID BM  . folic acid  1 mg Oral Daily  . pentoxifylline  400 mg Oral TID WC  . polyethylene glycol  17 g Oral Daily  . thiamine  100 mg Oral Daily   Continuous Infusions: . sodium chloride 75 mL/hr at 05/10/13 1610    Principal Problem:   Acute hepatic failure Active Problems:   Jaundice   Transaminitis   ARF (acute renal failure)   UTI (lower urinary tract infection)    Time spent: 35 minutes    Zhara Gieske A, MD   [facility] Pager [phone]. If 7PM-7AM, please contact night-coverage at [email or web address], password [facility] 05/10/2013, 10:29 AM  LOS: 6 days

## 2013-05-11 LAB — PROTIME-INR: INR: 1.41 (ref 0.00–1.49)

## 2013-05-11 LAB — HCV RNA QUANT: HCV Quantitative: 37063 IU/mL — ABNORMAL HIGH (ref <15)

## 2013-05-11 NOTE — Progress Notes (Signed)
[facility] PROGRESS NOTE  Kara Bailey [id number]:[id number] DOB: 09-Mar-1956 DOA: 05/04/2013 PCP: Benita Stabile, MD   HPI/Subjective: Improving abdominal pain and LFTs. She was still vomiting yesterday, if she tolerates diet can be discharged later today and follow up with GI as outpatient  Assessment/Plan:  Principle problem   Alcoholic hepatitis with background of chronic hepatitis C  -Jaundice/transaminitis  -multifactorial; possibly secondary to hepatitis C and alcoholic hepatitis -negative ANA; normal acetaminophen level -afebrile, no leukocytosis  -LFTs still elevated but stable; increased bilirubin -continue monitoring PT/INR -per GI, started on pentoxifylline -possibly early cirrhosis on abdominal ultrasound, no significant biliary or pancreatic abnormalities -CT scan of abdomen/pelvis revealed no obvious liver injury or tumor -continue thiamine and folic acid, follow CIWA protocol   -Continues to improve AST/ALT, alkaline phosphatase and INR improving steadily. -Total bilirubin still high (but better than yesterday), per GI this is typically lags behind other LFTs.   Active problems  Acute renal failure  -Likely secondary to dehydration, resolved with IV fluids.  Urinary tract infection  - urine culture indicated multiple bacterial morphoctyes present, none predominant  - Asymptomatic- no treatment at this time  - monitor off antibiotics   Anxiety  -prn Xanax  Alcohol abuse -Patient reported that she's been sober for at least a month prior to admission.  Code Status: Full  Family Communication: fiance, brother and sister Disposition Plan: Inpatient    Consultants:  GI  Procedures:  none  Antibiotics:  none   Objective: Filed Vitals:   05/10/13 0505 05/10/13 1437 05/10/13 2243 05/11/13 0549  BP: 128/60 114/70 106/50 102/57  Pulse: 66 65 68 70  Temp: 98.1 F (36.7 C) 98.4 F (36.9 C) 98.1 F (36.7 C) 97.9 F (36.6 C)  TempSrc:  Oral Oral Oral Oral  Resp: 16 18 18 18   Height:      Weight:      SpO2: 97% 97% 95% 96%    Intake/Output Summary (Last 24 hours) at 05/11/13 0841 Last data filed at 05/11/13 7253  Gross per 24 hour  Intake  847.5 ml  Output      0 ml  Net  847.5 ml   Filed Weights   05/04/13 2045  Weight: 51.891 kg (114 lb 6.4 oz)    Exam:  General: Lethargic, slender female resting in bed, A&O x3, in NAD  HEENT: Eyes- icterus bilaterally  Cardiovascular: regular rate and rhythm, no murmurs, gallops, or rubs  Respiratory: clear to auscultation bilaterally, no increased work of breathing.  Abdomen: bowel sounds normal, soft, mild tenderness to deep palpation of suprapubic area, no masses or hepatosplenomegaly    Data Reviewed: Basic Metabolic Panel:  Recent Labs Lab 05/04/13 1618 05/05/13 0440 05/07/13 0600  NA 138 138 138  K 3.4* 4.0 4.3  CL 100 107 104  CO2 26 22 24   GLUCOSE 112* 84 71  BUN 10 8 5*  CREATININE 1.26* 0.93 0.60  CALCIUM 9.7 8.5 8.8   Liver Function Tests:  Recent Labs Lab 05/07/13 0600 05/08/13 0530 05/09/13 0458 05/10/13 0630 05/11/13 0515  AST 3096* 2082* 1195* 835* 433*  ALT 1648* 1263* 985* 888* 570*  ALKPHOS 255* 224* 203* 220* 183*  BILITOT 21.0* 20.4* 19.6* 26.8* 23.0*  PROT 4.7* 4.5* 4.5* 5.5* 4.6*  ALBUMIN 2.5* 2.4* 2.3* 2.8* 2.4*    Recent Labs Lab 05/04/13 1618  LIPASE 48   No results found for this basename: AMMONIA,  in the last 168 hours CBC:  Recent Labs Lab 05/04/13 1618 05/05/13  0440 05/07/13 0600  WBC 9.3 9.7 9.6  NEUTROABS 5.9  --   --   HGB 18.3* 15.0 15.8*  HCT 50.6* 41.3 43.6  MCV 94.4 92.4 92.8  PLT 175 219 116*   Cardiac Enzymes: No results found for this basename: CKTOTAL, CKMB, CKMBINDEX, TROPONINI,  in the last 168 hours BNP (last 3 results) No results found for this basename: PROBNP,  in the last 8760 hours CBG: No results found for this basename: GLUCAP,  in the last 168 hours  Recent Results (from  the past 240 hour(s))  URINE CULTURE     Status: None   Collection Time    05/04/13  4:37 PM      Result Value Range Status   Specimen Description URINE, RANDOM   Final   Special Requests NONE   Final   Culture  Setup Time 05/04/2013 17:45   Final   Colony Count 60,000 COLONIES/ML   Final   Culture     Final   Value: Multiple bacterial morphotypes present, none predominant. Suggest appropriate recollection if clinically indicated.   Report Status 05/05/2013 FINAL   Final     Studies: No results found.  Scheduled Meds: . feeding supplement  237 mL Oral BID BM  . folic acid  1 mg Oral Daily  . pentoxifylline  400 mg Oral TID WC  . polyethylene glycol  17 g Oral Daily  . thiamine  100 mg Oral Daily   Continuous Infusions: . sodium chloride 75 mL/hr at 05/11/13 1610    Principal Problem:   Acute hepatic failure Active Problems:   Jaundice   Transaminitis   ARF (acute renal failure)   UTI (lower urinary tract infection)    Time spent: 35 minutes    Jenkins Risdon A, MD   [facility] Pager [phone]. If 7PM-7AM, please contact night-coverage at [email or web address], password [facility] 05/11/2013, 8:41 AM  LOS: 7 days

## 2013-05-11 NOTE — Care Management Note (Signed)
  Page 2 of 2   05/13/2013     10:19:05 AM   CARE MANAGEMENT NOTE 05/13/2013  Patient:  Kara Bailey, Kara Bailey   Account Number:  [id number]  Date Initiated:  05/05/2013  Documentation initiated by:  Kara [facility]  Subjective/Objective Assessment:   abd pain, UTI, hepatitis C     Action/Plan:   lives at home with dtr, Kara Bailey [phone]   Anticipated DC Date:  05/12/2013   Anticipated DC Plan:  HOME/SELF CARE  In-house referral  Financial Counselor      DC Planning Services  CM consult  Medication Assistance  MATCH Program      Choice offered to / List presented to:             Status of service:  In process, will continue to follow Medicare Important Message given?   (If response is "NO", the following Medicare IM given date fields will be blank) Date Medicare IM given:   Date Additional Medicare IM given:    Discharge Disposition:    Per UR Regulation:    If discussed at Long Length of Stay Meetings, dates discussed:    Comments:  05-13-13 Appointments :  [facility] [phone] on May 28, 2013 at 2 pm   [facility] [phone] on May 20, 2013 at 1230 [address] [place].  Kara Flurry RN BSN [phone]      05/12/13 16:43 Kara Cape RN, BSN [phone] patient transferred to Surgery unit, patient has f/u appt with [facility], but needs to be informed about details of appt.  05/11/13 16:16 Kara Cape RN, BSN [phone] patient was not able to tolerate diet today, plan for dc tomorrow.  Patient does not have any insurance will more than likely need medication ast.  05/06/2013 1600 NCM spoke to sister, Kara Bailey # [phone] or [phone]. Provided her with information on community resources such as SS Admin, DSS, and [employer]. 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 She feels she will not be able to return to work. Explained pt can follow up with SS Admin for  disability benefits and speak to her PCP to assist with paperwork. Kara Donning RN CCM Case Mgmt phone [phone]  05/05/2013 1430 NCM spoke to pt and gave permission to speak to dtr or son. Dtr, Kara Bailey reports pt goes to Dr [name]. Her out of pocket cost runs $25. She usually goes 2x per year. Pt states her out of pocket cost for her medications run $16 at [facility]. Kara Donning RN CCM Case Mgmt phone [phone]

## 2013-05-12 LAB — HEPATIC FUNCTION PANEL
ALT: 570 U/L — ABNORMAL HIGH (ref 0–35)
ALT: 431 U/L — ABNORMAL HIGH (ref 0–35)
Alkaline Phosphatase: 183 U/L — ABNORMAL HIGH (ref 39–117)
Alkaline Phosphatase: 171 U/L — ABNORMAL HIGH (ref 39–117)
Bilirubin, Direct: 19.8 mg/dL — ABNORMAL HIGH (ref 0.0–0.3)
Indirect Bilirubin: 3.2 mg/dL
Indirect Bilirubin: 3 mg/dL — ABNORMAL HIGH (ref 0.3–0.9)
Total Bilirubin: 23 mg/dL (ref 0.3–1.2)
Total Bilirubin: 23.1 mg/dL (ref 0.3–1.2)

## 2013-05-12 MED ORDER — VALACYCLOVIR HCL 500 MG PO TABS
1000.0000 mg | ORAL_TABLET | Freq: Three times a day (TID) | ORAL | Status: DC
  Administered 2013-05-12 – 2013-05-13 (×3): 1000 mg via ORAL
  Filled 2013-05-12 (×5): qty 2

## 2013-05-12 NOTE — Progress Notes (Signed)
[facility] PROGRESS NOTE  Kara Bailey JYN:[id number] DOB: 20-Sep-1956 DOA: 05/04/2013 PCP: Benita Stabile, MD  HPI/Subjective: 57 y/o woman with h/o ETOH abuse who states she quit 4 weeks ago, comes in today because a friend told her that her eyes were yellow. She has also been having suprapubic pain and dark-colored urine. In the ED she was found to have very elevated LFTs. We have been asked to admit her for further evaluation and management.  Patient continues to complain of some nausea.  States that she had an episode of vomiting after trying to eat food yesterday evening.  She denies abdominal pain, and is having normal bowel movements, and urination.  She complained of a "burning sensation" on her back this morning.  Upon exam, a blistering rash was noted on her back at midline, which followed a dermatomal pattern to her abdomen.  This is consistent of shingles, for which she was moved to airborne precautions at this time.  She will be started on valcyclovir, and diet will be slowly advanced until tolerable.    Assessment/Plan:  Principle Problem: Alcoholic hepatitis with background of chronic hepatitis C  -Jaundice/transaminitis  -multifactorial; possibly secondary to hepatitis C and alcoholic hepatitis  -negative ANA; normal acetaminophen level  -afebrile, no leukocytosis  -LFTs continuing to trend downwards; total bilirubin continuing to lag behind  -continue monitoring PT/INR  -per GI, started on pentoxifylline and should be continued for 28 days -possibly early cirrhosis on abdominal ultrasound, no significant biliary or pancreatic abnormalities  -CT scan of abdomen/pelvis revealed no obvious liver injury or tumor  -continue thiamine and folic acid, follow CIWA protocol  -Continues to improve AST/ALT, alkaline phosphatase and INR improving steadily.  -Total bilirubin still high (but better than yesterday 05/11/13), per GI this is typically lags behind other LFTs.   -GI recommends follow-up with Dr. Evette Cristal upon discharge  Active problems  Acute renal failure  -Likely secondary to dehydration, resolved with IV fluids.   Urinary tract infection  - urine culture indicated multiple bacterial morphoctyes present, none predominant  - Asymptomatic- no treatment at this time    Anxiety  -prn Xanax   Nausea & Vomiting  -likely from chronic pancreatitis and seems to be brought on my certain foods  -patient stated she had one episode of vomiting yesterday evening after trying solid foods -start on full liquid diet and advance as she tolerance increases -she denies vomiting today, but continues to feel nauseous  -control with zofran and phenergan prn   Rash -Vesicular rash following dermatomal distribution, likely shingles. -place patient on airborne precautions  -start on valcyclovir  Alcohol abuse  -Patient reported that she's been sober for at least a month prior to admission.    Code Status: Full. Family Communication: No one at bedside Disposition Plan: Inpatient; start on valcyclovir for shingles and move to airborne isolation room.  Make sure patient tolerates advancement in diet without vomiting before discharge is considered.   Consultants:  GI - as per Dr. Madilyn Fireman, her alcoholic transaminitis is improving, and bilirubin is stable.  Hepatitis C was confirmed.  -He recommends continuing pentoxifylline for 28 days.  Discharge when able to tolerate food by mouth sufficiently.   -She is not a candidate for Hepatitic C treatment due to alcohol use at this time.   -Follow-up with Dr. Evette Cristal upon discharge.  Procedures:  None.  Antibiotics: None.    Objective: Filed Vitals:   05/11/13 0549 05/11/13 1300 05/11/13 2121 05/12/13 0621  BP: 102/57 130/78  129/63 119/68  Pulse: 70 80 71 76  Temp: 97.9 F (36.6 C) 97.8 F (36.6 C) 98.5 F (36.9 C) 97.8 F (36.6 C)  TempSrc: Oral Oral Oral Oral  Resp: 18 16 16 16   Height:      Weight:       SpO2: 96% 99% 96% 98%    Intake/Output Summary (Last 24 hours) at 05/12/13 1036 Last data filed at 05/12/13 0644  Gross per 24 hour  Intake    880 ml  Output      1 ml  Net    879 ml   Filed Weights   05/04/13 2045  Weight: 51.891 kg (114 lb 6.4 oz)    Exam:   General:  Patient sleeping upon entering room, but easily aroused; A&Ox3, in no acute distress.  Cardiovascular: RRR, without mgr.  Respiratory: lungs clear to auscultation bilaterally, without wheezes, rhonchi, or rales.  Abdomen: soft, mildly tender in RUQ, nondistended; BS +.  Musculoskeletal: actively able to move all 4 extremities; no edema noted.  Skin: jaundice; shingles rash with blistering and crusting noted at midline on back, which has moved to her abdomen, following a dermatomal pattern; no bruising or ulceration noted.  Data Reviewed: Basic Metabolic Panel:  Recent Labs Lab 05/07/13 0600  NA 138  K 4.3  CL 104  CO2 24  GLUCOSE 71  BUN 5*  CREATININE 0.60  CALCIUM 8.8   Liver Function Tests:  Recent Labs Lab 05/08/13 0530 05/09/13 0458 05/10/13 0630 05/11/13 0515 05/12/13 0647  AST 2082* 1195* 835* 433* 298*  ALT 1263* 985* 888* 570* 431*  ALKPHOS 224* 203* 220* 183* 171*  BILITOT 20.4* 19.6* 26.8* 23.0* 23.1*  PROT 4.5* 4.5* 5.5* 4.6* 4.5*  ALBUMIN 2.4* 2.3* 2.8* 2.4* 2.3*   CBC:  Recent Labs Lab 05/07/13 0600  WBC 9.6  HGB 15.8*  HCT 43.6  MCV 92.8  PLT 116*    Recent Results (from the past 240 hour(s))  URINE CULTURE     Status: None   Collection Time    05/04/13  4:37 PM      Result Value Range Status   Specimen Description URINE, RANDOM   Final   Special Requests NONE   Final   Culture  Setup Time 05/04/2013 17:45   Final   Colony Count 60,000 COLONIES/ML   Final   Culture     Final   Value: Multiple bacterial morphotypes present, none predominant. Suggest appropriate recollection if clinically indicated.   Report Status 05/05/2013 FINAL   Final      Scheduled Meds: . feeding supplement  237 mL Oral BID BM  . folic acid  1 mg Oral Daily  . pentoxifylline  400 mg Oral TID WC  . polyethylene glycol  17 g Oral Daily  . thiamine  100 mg Oral Daily   Continuous Infusions: . sodium chloride 75 mL/hr at 05/11/13 2016    Principal Problem:   Acute hepatic failure Active Problems:   Jaundice   Transaminitis   ARF (acute renal failure)   UTI (lower urinary tract infection)     Kara Bailey, VERONICA PA-S [facility] 05/12/2013, 10:36 AM  LOS: 8 days    Addendum  Patient seen and examined, chart and data base reviewed.  I agree with the above assessment and plan.  For full details please see Mrs. Algis Downs PA note.   Clint Lipps, MD [facility] Pager: [phone] 05/12/2013, 2:00 PM

## 2013-05-12 NOTE — Progress Notes (Signed)
Patient transferred to [facility] room 32. Requiring airborne and contact precautions for new dx of shingles. Oriented to room and PPE required. Call bell in reach.

## 2013-05-12 NOTE — Progress Notes (Signed)
[facility] Progress Note  Subjective: Abdominal pain a little better also states nausea is better today after some problems yesterday. Still is on clear liquid diet  Objective: Vital signs in last 24 hours: Temp:  [97.8 F (36.6 C)-98.5 F (36.9 C)] 97.8 F (36.6 C) (07/01 0621) Pulse Rate:  [71-80] 76 (07/01 0621) Resp:  [16] 16 (07/01 0621) BP: (119-130)/(63-78) 119/68 mmHg (07/01 0621) SpO2:  [96 %-99 %] 98 % (07/01 0621) Weight change:    PE: Unchanged  Lab Results: Results for orders placed during the hospital encounter of 05/04/13 (from the past 24 hour(s))  PROTIME-INR     Status: Abnormal   Collection Time    05/12/13  6:47 AM      Result Value Range   Prothrombin Time 17.0 (*) 11.6 - 15.2 seconds   INR 1.42  0.00 - 1.49  HEPATIC FUNCTION PANEL     Status: Abnormal   Collection Time    05/12/13  6:47 AM      Result Value Range   Total Protein 4.5 (*) 6.0 - 8.3 g/dL   Albumin 2.3 (*) 3.5 - 5.2 g/dL   AST 528 (*) 0 - 37 U/L   ALT 431 (*) 0 - 35 U/L   Alkaline Phosphatase 171 (*) 39 - 117 U/L   Total Bilirubin 23.1 (*) 0.3 - 1.2 mg/dL   Bilirubin, Direct 41.3 (*) 0.0 - 0.3 mg/dL   Indirect Bilirubin 3.0 (*) 0.3 - 0.9 mg/dL    Studies/Results: No results found.    Assessment: Alcoholic hepatitis transaminases improving, bilirubin stable Hepatitis C, confirmed viremia  Plan: Continue pentoxifylline for 28 days Discharge when able to tolerate by mouth sufficiently Not a candidate for hepatitis C treatment to 2 alcohol use at this time Followup with Dr. Evette Cristal when dischargable  Kaiyu Mirabal C 05/12/2013, 10:25 AM

## 2013-05-12 NOTE — Progress Notes (Signed)
Patient has red raised rash on left side of back and mid back area. C/O burning.

## 2013-05-13 LAB — PROTIME-INR: Prothrombin Time: 17.1 seconds — ABNORMAL HIGH (ref 11.6–15.2)

## 2013-05-13 LAB — HEPATIC FUNCTION PANEL
Alkaline Phosphatase: 175 U/L — ABNORMAL HIGH (ref 39–117)
Bilirubin, Direct: 18.9 mg/dL — ABNORMAL HIGH (ref 0.0–0.3)
Total Bilirubin: 25.8 mg/dL (ref 0.3–1.2)

## 2013-05-13 MED ORDER — FOLIC ACID 1 MG PO TABS: 1.0000 mg | ORAL_TABLET | Freq: Every day | ORAL | Status: DC

## 2013-05-13 MED ORDER — ENSURE COMPLETE PO LIQD: 237.0000 mL | Freq: Two times a day (BID) | ORAL | Status: DC

## 2013-05-13 MED ORDER — THIAMINE HCL 100 MG PO TABS: 100.0000 mg | ORAL_TABLET | Freq: Every day | ORAL | Status: DC

## 2013-05-13 MED ORDER — ONDANSETRON HCL 4 MG PO TABS: 4.0000 mg | ORAL_TABLET | Freq: Four times a day (QID) | ORAL | Status: DC | PRN

## 2013-05-13 MED ORDER — ALPRAZOLAM 0.5 MG PO TABS: 0.5000 mg | ORAL_TABLET | Freq: Three times a day (TID) | ORAL | Status: DC | PRN

## 2013-05-13 MED ORDER — OXYCODONE HCL 5 MG PO TABS: 5.0000 mg | ORAL_TABLET | Freq: Three times a day (TID) | ORAL | Status: DC | PRN

## 2013-05-13 MED ORDER — VALACYCLOVIR HCL 1 G PO TABS: 1000.0000 mg | ORAL_TABLET | Freq: Three times a day (TID) | ORAL | Status: DC

## 2013-05-13 NOTE — Progress Notes (Signed)
Discharge home. Home discharge instruction given, no question verbalized. 

## 2013-05-13 NOTE — Discharge Summary (Signed)
[facility]                                                                                   Kara Bailey, is a 57 y.o. female  DOB January 13, 1956  MRN [id number].  Admission date:  05/04/2013  Discharge Date:  05/13/2013  Primary MD  Benita Stabile, MD  Admitting Physician  Henderson Cloud, MD  Admission Diagnosis  Alcohol abuse [305.00] ARF (acute renal failure) [584.9] Urinary tract infection [599.0] Hepatitis [573.3] Jaundice [782.4] Transaminitis [790.4]  Discharge Diagnosis     Principal Problem:   Acute hepatic failure Active Problems:   Jaundice   Transaminitis   ARF (acute renal failure)   UTI (lower urinary tract infection)    Past Medical History  Diagnosis Date  . Hepatitis C     Past Surgical History  Procedure Laterality Date  . Appendectomy       Recommendations for primary care physician for things to follow:   Follow CMP, HIV panel.    Discharge Diagnoses:   Principal Problem:   Acute hepatic failure Active Problems:   Jaundice   Transaminitis   ARF (acute renal failure)   UTI (lower urinary tract infection)    Discharge Condition: stable   Diet recommendation: See Discharge Instructions below   Consults GI, ID Dr Orvan Falconer over phone    History of present illness and  Hospital Course:     Kindly see H&P for history of present illness and admission details, please review complete Labs, Consult reports and Test reports for all details in brief Kara Bailey, is a 57 y.o. female, patient with history of alcohol abuse, was admitted with a hospital for progressive jaundice, her history was positive for alcohol abuse and her workup was consistent with new diagnosis of hepatitis C along with alcoholic hepatitis. She was seen by GI physician Dr. Evette Cristal who recommended supportive care for alcoholic hepatitis and hep C with outpatient followup with him.Patient's liver enzymes are now trending better, she has been  counseled extensively to quit alcohol. She will be placed on folic acid and thiamine upon discharge. No signs of DTs.   Of note while here patient developed a vesicular rash in the left flank around T6-T7 dermatome typical for shingles breakout. She has been placed on Valtrex. I have discussed her case with ID physician on call Dr. Orvan Falconer who recommends 7 days of Valtrex which will be provided. She will also get Neurontin along with low-dose narcotic for a few days. In the light of new diagnosis of hepatitis C, shingles breakout and HIV panel has been ordered, patient has been requested to practice safe sex and have all her present and past partners check for hepatitis and HIV and to practice safe sex using condoms at all times, she is also been provided with contact instructions for shingles breakout.    Today   Subjective:   Kara Bailey today has no headache,no chest abdominal pain,no new weakness tingling or numbness, feels much better wants to go home today.    Objective:   Blood pressure 142/60, pulse 74, temperature 98.6 F (37 C), temperature source Oral, resp. rate 18, height  5\' 5"  (1.651 m), weight 50.654 kg (111 lb 10.8 oz), SpO2 94.00%.   Intake/Output Summary (Last 24 hours) at 05/13/13 0847 Last data filed at 05/12/13 2200  Gross per 24 hour  Intake    751 ml  Output      0 ml  Net    751 ml    Exam Awake Alert, Oriented *3, No new F.N deficits, Normal affect [place].AT,PERRAL, icterus +ve  Supple Neck,No JVD, No cervical lymphadenopathy appriciated.  Symmetrical Chest wall movement, Good air movement bilaterally, CTAB RRR,No Gallops,Rubs or new Murmurs, No Parasternal Heave +ve B.Sounds, Abd Soft, Non tender, No organomegaly appriciated, No rebound -guarding or rigidity. L Flank vesicular rash in dermatome pattern No Cyanosis, Clubbing or edema, No new Rash or bruise  Data Review   Major procedures and Radiology Reports - PLEASE review detailed and final reports  for all details in brief -       US Abdomen Complete  05/04/2013   *RADIOLOGY REPORT*  Clinical Data:  Elevated liver function tests, EtOH abuse  COMPLETE ABDOMINAL ULTRASOUND  Comparison:  None.  Findings:  Gallbladder:  The gallbladder is contracted.  No gallstones are seen and there is no pain over the gallbladder with compression.  Common bile duct:  The common bile duct is normal measuring 2.6 mm in diameter.  Liver:  The liver has a relatively normal echogenic appearance. The contours of the liver may be slightly nodular and early cirrhosis is a consideration.  No focal hepatic abnormality is seen.  IVC:  Appears normal.  Pancreas:  The pancreas is partially obscured by bowel gas.  Spleen:  The spleen is normal measuring 4.9 cm sagittally.  Right Kidney:  No hydronephrosis is seen.  The right kidney measures 9.3 cm sagittally.  Left Kidney:  No hydronephrosis is noted.  The left kidney measures 9.8 cm.  Abdominal aorta:  The abdominal aorta is normal in caliber.  IMPRESSION:  1.  Contracted gallbladder.  No gallstones or pain over the gallbladder. 2.  Slightly nodular contours of the liver may indicate mild cirrhosis.  No focal abnormality is seen. 3.  Portions of the pancreas are obscured by bowel gas.   Original Report Authenticated By: Dwyane Dee, M.D.   Ct Abdomen Pelvis W Contrast  05/06/2013   *RADIOLOGY REPORT*  Clinical Data: 57 year old female abnormal liver enzymes with lower abdominal pain nausea.  CT ABDOMEN AND PELVIS WITH CONTRAST  Technique:  Multidetector CT imaging of the abdomen and pelvis was performed following the standard protocol during bolus administration of intravenous contrast.  Contrast: 80mL OMNIPAQUE IOHEXOL 300 MG/ML  SOLN  Comparison: abdominal ultrasound 05/04/2013.  Findings: Small or trace bilateral layering pleural effusions.  No pericardial effusion. No acute osseous abnormality identified. Small volume pelvic free fluid mostly in the cul-de-sac.  Negative uterus  and adnexa.  Negative distal colon.  The bladder is distended.  The more proximal colon is somewhat under distended which is favored to explain the appearance of mild wall thickening. No pericolonic mesenteric stranding identified.  Oral contrast has now reached the terminal ileum.  No dilated small bowel.  Appendix not identified.  Mild motion artifact in the abdomen.  The stomach is decompressed.  Duodenum within normal limits.  The gallbladder remains decompressed.  There is evidence of some periportal edema/fluid also at the porta hepatis. Trace perihepatic free fluid. Small volume of fluid also in the gallbladder fossa. Liver enhancement within normal limits. No biliary ductal dilatation is identified.  Spleen, pancreas,  adrenal glands, and portal venous system are within normal limits.  Major arterial structures in the abdomen pelvis are patent.  There is widespread atherosclerosis.  The kidneys appear nonobstructed.  Extrarenal pelves.  No hydroureter. Normal renal enhancement.  No lymphadenopathy.  IMPRESSION: 1.  Trace layering pleural effusions.  Nonspecific small volume of pelvic free fluid. 2.  Trace perihepatic and gallbladder fossa free fluid.  Suggestion of mild periportal edema and fluid or stranding at the porta hepatis.  These are nonspecific.  Query hepatitis. 3.  Mild widespread colonic wall thickening is felt to be artifact due to underdistension.  No bowel obstruction. 4.  Distended bladder, consider Foley catheter placement.   Original Report Authenticated By: Erskine Speed, M.D.    Micro Results      Recent Results (from the past 240 hour(s))  URINE CULTURE     Status: None   Collection Time    05/04/13  4:37 PM      Result Value Range Status   Specimen Description URINE, RANDOM   Final   Special Requests NONE   Final   Culture  Setup Time 05/04/2013 17:45   Final   Colony Count 60,000 COLONIES/ML   Final   Culture     Final   Value: Multiple bacterial morphotypes present, none  predominant. Suggest appropriate recollection if clinically indicated.   Report Status 05/05/2013 FINAL   Final     CBC w Diff: Lab Results  Component Value Date   WBC 9.6 05/07/2013   HGB 15.8* 05/07/2013   HCT 43.6 05/07/2013   PLT 116* 05/07/2013   LYMPHOPCT 25 05/04/2013   MONOPCT 10 05/04/2013   EOSPCT 1 05/04/2013   BASOPCT 1 05/04/2013    CMP: Lab Results  Component Value Date   NA 138 05/07/2013   K 4.3 05/07/2013   CL 104 05/07/2013   CO2 24 05/07/2013   BUN 5* 05/07/2013   CREATININE 0.60 05/07/2013   PROT 4.7* 05/13/2013   ALBUMIN 2.3* 05/13/2013   BILITOT 25.8* 05/13/2013   ALKPHOS 175* 05/13/2013   AST 264* 05/13/2013   ALT 358* 05/13/2013  .   Discharge Instructions     Follow with Primary MD Benita Stabile, MD or as suggested by case management in 7 days. Follow your HIV results next visit.  Practice safe sex with condoms, get all your sexual partners present and past checked for hepatitis C and HIV.  He should not been near anyone who is pregnant for the next 10 days. Your household members should be wearing facemask when they are around you because you have outbreak of shingles.  Get CBC, CMP, checked 7 days by Primary MD and again as instructed by your Primary MD.   Get Medicines reviewed and adjusted.  Please request your Prim.MD to go over all Hospital Tests and Procedure/Radiological results at the follow up, please get all Hospital records sent to your Prim MD by signing hospital release before you go home.  Activity: As tolerated with Full fall precautions use walker/cane & assistance as needed   Diet:  Heart healthy  For Heart failure patients - Check your Weight same time everyday, if you gain over 2 pounds, or you develop in leg swelling, experience more shortness of breath or chest pain, call your Primary MD immediately. Follow Cardiac Low Salt Diet and 1.8 lit/day fluid restriction.  Disposition Home   If you experience worsening of your admission  symptoms, develop shortness of breath, life threatening emergency, suicidal or  homicidal thoughts you must seek medical attention immediately by calling 911 or calling your MD immediately  if symptoms less severe.  You Must read complete instructions/literature along with all the possible adverse reactions/side effects for all the Medicines you take and that have been prescribed to you. Take any new Medicines after you have completely understood and accpet all the possible adverse reactions/side effects.   Do not drive and provide baby sitting services if your were admitted for syncope or siezures until you have seen by Primary MD or a Neurologist and advised to do so again.  Do not drive when taking Pain medications.    Do not take more than prescribed Pain, Sleep and Anxiety Medications  Special Instructions: If you have smoked or chewed Tobacco  in the last 2 yrs please stop smoking, stop any regular Alcohol  and or any Recreational drug use.  Wear Seat belts while driving.   Please note  You were cared for by a hospitalist during your hospital stay. If you have any questions about your discharge medications or the care you received while you were in the hospital after you are discharged, you can call the unit and asked to speak with the hospitalist on call if the hospitalist that took care of you is not available. Once you are discharged, your primary care physician will handle any further medical issues. Please note that NO REFILLS for any discharge medications will be authorized once you are discharged, as it is imperative that you return to your primary care physician (or establish a relationship with a primary care physician if you do not have one) for your aftercare needs so that they can reassess your need for medications and monitor your lab values.    Follow-up Information   Follow up with Graylin Shiver, MD In 2 weeks.   Contact information:   [address] [place] [address] [place] [address] [phone]       Follow up with [facility] On 05/20/2013. (12:30)    Contact information:   [address] [place] [address] [phone]      Follow up with Acey Lav, MD. Schedule an appointment as soon as possible for a visit in 1 week. (or his partners at ID clinic)    Contact information:   [address] [place] [address] [phone]         Discharge Medications     Medication List         ALPRAZolam 0.5 MG tablet  Commonly known as:  XANAX  Take 1 tablet (0.5 mg total) by mouth 3 (three) times daily as needed for anxiety.     feeding supplement Liqd  Take 237 mLs by mouth 2 (two) times daily between meals.     folic acid 1 MG tablet  Commonly known as:  FOLVITE  Take 1 tablet (1 mg total) by mouth daily.     gabapentin 600 MG tablet  Commonly known as:  NEURONTIN  Take 600 mg by mouth 2 (two) times daily.     ondansetron 4 MG tablet  Commonly known as:  ZOFRAN  Take 1 tablet (4 mg total) by mouth every 6 (six) hours as needed for nausea.     oxyCODONE 5 MG immediate release tablet  Commonly known as:  Oxy IR/ROXICODONE  Take 1 tablet (5 mg total) by mouth every 8 (eight) hours as needed.     thiamine  100 MG tablet  Take 1 tablet (100 mg total) by mouth daily.     valACYclovir 1000 MG tablet  Commonly known as:  VALTREX  Take 1 tablet (1,000 mg total) by mouth 3 (three) times daily.           Total Time in preparing paper work, data evaluation and todays exam - 35 minutes  Leroy Sea M.D on 05/13/2013 at 8:47 AM  [facility] Office  [phone]

## 2013-05-14 LAB — HIV ANTIBODY (ROUTINE TESTING W REFLEX): HIV: NONREACTIVE

## 2013-05-20 ENCOUNTER — Ambulatory Visit: Payer: No Typology Code available for payment source | Attending: Family Medicine | Admitting: Family Medicine

## 2013-05-20 VITALS — BP 120/78 | HR 81 | Temp 97.7°F | Resp 15 | Ht 65.0 in | Wt 108.4 lb

## 2013-05-20 DIAGNOSIS — K7200 Acute and subacute hepatic failure without coma: Secondary | ICD-10-CM

## 2013-05-20 DIAGNOSIS — R17 Unspecified jaundice: Secondary | ICD-10-CM

## 2013-05-20 DIAGNOSIS — Z09 Encounter for follow-up examination after completed treatment for conditions other than malignant neoplasm: Secondary | ICD-10-CM | POA: Insufficient documentation

## 2013-05-20 DIAGNOSIS — K7460 Unspecified cirrhosis of liver: Secondary | ICD-10-CM | POA: Insufficient documentation

## 2013-05-20 DIAGNOSIS — R7401 Elevation of levels of liver transaminase levels: Secondary | ICD-10-CM

## 2013-05-20 DIAGNOSIS — N179 Acute kidney failure, unspecified: Secondary | ICD-10-CM

## 2013-05-20 DIAGNOSIS — Z79899 Other long term (current) drug therapy: Secondary | ICD-10-CM | POA: Insufficient documentation

## 2013-05-20 DIAGNOSIS — B029 Zoster without complications: Secondary | ICD-10-CM | POA: Insufficient documentation

## 2013-05-20 DIAGNOSIS — B1920 Unspecified viral hepatitis C without hepatic coma: Secondary | ICD-10-CM | POA: Insufficient documentation

## 2013-05-20 LAB — CBC
MCHC: 34.5 g/dL (ref 30.0–36.0)
MCV: 94.4 fL (ref 78.0–100.0)
Platelets: 222 10*3/uL (ref 150–400)
RDW: 16.4 % — ABNORMAL HIGH (ref 11.5–15.5)
WBC: 10.1 10*3/uL (ref 4.0–10.5)

## 2013-05-20 NOTE — Progress Notes (Signed)
Patient ID: Kara Bailey, female   DOB: [birth year], 57 y.o.   MRN: [id number]  CC: Hospital follow up   HPI: Pt is presenting to follow up from recent hospitalization where she was diagnosed with hepatitis C and cirrhosis of the liver.  She reports that she has a long history of alcohol use but reports that she stopped alcohol approximately 5 weeks ago.  The patient reports that she's been having a difficult time getting a followup appointment with her gastroenterologist because she doesn't have medical insurance.  She reports that she has a 61 Dollar outstanding ill with her previous primary care physician.  Patient reports that she is also trying to get an appointment with infectious disease.  She was recently diagnosed with an outbreak of shingles in the hospital and reports that taking the Valtrex has helped with her symptoms.  No Known Allergies Past Medical History  Diagnosis Date  . Hepatitis C   . Shingles     left side   Current Outpatient Prescriptions on File Prior to Visit  Medication Sig Dispense Refill  . ALPRAZolam (XANAX) 0.5 MG tablet Take 1 tablet (0.5 mg total) by mouth 3 (three) times daily as needed for anxiety.  20 tablet  0  . feeding supplement (ENSURE COMPLETE) LIQD Take 237 mLs by mouth 2 (two) times daily between meals.  30 Bottle  0  . folic acid (FOLVITE) 1 MG tablet Take 1 tablet (1 mg total) by mouth daily.  30 tablet  0  . gabapentin (NEURONTIN) 600 MG tablet Take 600 mg by mouth 2 (two) times daily.      . ondansetron (ZOFRAN) 4 MG tablet Take 1 tablet (4 mg total) by mouth every 6 (six) hours as needed for nausea.  20 tablet  0  . oxyCODONE (OXY IR/ROXICODONE) 5 MG immediate release tablet Take 1 tablet (5 mg total) by mouth every 8 (eight) hours as needed.  20 tablet  0  . thiamine 100 MG tablet Take 1 tablet (100 mg total) by mouth daily.  30 tablet  0  . valACYclovir (VALTREX) 1000 MG tablet Take 1 tablet (1,000 mg total) by mouth 3 (three) times daily.   21 tablet  0   No current facility-administered medications on file prior to visit.   History reviewed. No pertinent family history. History   Social History  . Marital Status: Divorced    Spouse Name: N/A    Number of Children: N/A  . Years of Education: N/A   Occupational History  . Not on file.   Social History Main Topics  . Smoking status: Current Some Day Smoker  . Smokeless tobacco: Not on file  . Alcohol Use: Yes     Comment: stopped on saturday  . Drug Use: No  . Sexually Active: Not on file   Other Topics Concern  . Not on file   Social History Narrative  . No narrative on file    Review of Systems  Constitutional: Negative for fever, chills, diaphoresis, activity change, appetite change and fatigue.  HENT: Negative for ear pain, nosebleeds, congestion, facial swelling, rhinorrhea, neck pain, neck stiffness and ear discharge.   Eyes: Negative for pain, discharge, redness, itching and visual disturbance.  Respiratory: Negative for cough, choking, chest tightness, shortness of breath, wheezing and stridor.   Cardiovascular: Negative for chest pain, palpitations and leg swelling.  Gastrointestinal: Negative for abdominal distention.  Genitourinary: Negative for dysuria, urgency, frequency, hematuria, flank pain, decreased urine volume, difficulty urinating  and dyspareunia.  Musculoskeletal: Negative for back pain, joint swelling, arthralgias and gait problem.  Neurological: Negative for dizziness, tremors, seizures, syncope, facial asymmetry, speech difficulty, weakness, light-headedness, numbness and headaches.  Hematological: Negative for adenopathy. Does not bruise/bleed easily.  Psychiatric/Behavioral: Negative for hallucinations, behavioral problems, confusion, dysphoric mood, decreased concentration and agitation.    Objective:   Filed Vitals:   05/20/13 1248  BP: 120/78  Pulse: 81  Temp: 97.7 F (36.5 C)  Resp: 15    Physical Exam   Constitutional: Appears chronically ill. Jaundiced & emaciated. No distress.  HENT: Normocephalic. External right and left ear normal. Oropharynx is clear and moist.  Eyes: Conjunctivae and EOM are normal. PERRLA,  scleral icterus noted.  Neck: Normal ROM. Neck supple. No JVD. No tracheal deviation. No thyromegaly.  CVS: RRR, S1/S2 +, no murmurs, no gallops, no carotid bruit.  Pulmonary: Effort and breath sounds normal, no stridor, rhonchi, wheezes, rales.  Abdominal: Soft. BS +,  no distension, tenderness, rebound or guarding.  Musculoskeletal: Normal range of motion. No edema and no tenderness.  Lymphadenopathy: No lymphadenopathy noted, cervical, inguinal. Neuro: Alert. Normal reflexes, muscle tone coordination. No cranial nerve deficit. Skin: Skin is warm and dry. No rash noted. Not diaphoretic. No erythema. No pallor.  Psychiatric: Normal mood and affect. Behavior, judgment, thought content normal.   Lab Results  Component Value Date   WBC 9.6 05/07/2013   HGB 15.8* 05/07/2013   HCT 43.6 05/07/2013   MCV 92.8 05/07/2013   PLT 116* 05/07/2013   Lab Results  Component Value Date   CREATININE 0.60 05/07/2013   BUN 5* 05/07/2013   NA 138 05/07/2013   K 4.3 05/07/2013   CL 104 05/07/2013   CO2 24 05/07/2013    No results found for this basename: HGBA1C   Lipid Panel  No results found for this basename: chol, trig, hdl, cholhdl, vldl, ldlcalc    Assessment and plan:   Patient Active Problem List   Diagnosis Date Noted  . Cirrhosis of liver 05/20/2013  . Acute hepatic failure 05/07/2013  . Jaundice 05/04/2013  . Transaminitis 05/04/2013  . ARF (acute renal failure) 05/04/2013  . UTI (lower urinary tract infection) 05/04/2013   FOLLOW UP WITH GI AND FOLLOW UP WITH ID - Pt has been trying to get an appointment but having difficulty because she has no medical insurance.  - Pt is applying for the [facility] card today and hopefully this will help her with getting an appointment    REFERRALS MADE IN EMR SYSTEM   RTC IN 6 WEEKS  CHECK CMP CBC AND HIV TEST TODAY  The patient was given clear instructions to go to ER or return to medical center if symptoms don't improve, worsen or new problems develop.  The patient verbalized understanding.  The patient was told to call to get any lab results if not heard anything in the next week.    Rodney Langton, MD, CDE, FAAFP [facility] [place], [place]

## 2013-05-20 NOTE — Progress Notes (Signed)
Patient is a hospital follow up Was recently admitted with hep C Has shingles to left side

## 2013-05-20 NOTE — Patient Instructions (Addendum)
PLEASE MAKE FOLLOW UP APPOINTMENTS WITH GASTROENTEROLOGIST AND INFECTIOUS DISEASE AS RECOMMENDED AT HOSPITAL DISCHARGE  Jaundice Jaundice is a yellowish discoloration of the skin, whites of the eyes, and mucous membranes. It is caused by increased levels of bilirubin in the blood (hyperbilirubinemia). Bilirubin is produced by the normal breakdown of red blood cells. Jaundice may mean the liver or bile system is not working normally. CAUSES  The most common causes include:  Viral hepatitis.  Gallstones.  Excess use of alcohol.  Liver disease.  Certain cancers. SYMPTOMS   Yellow color to the skin, whites of the eyes, or mucous membranes.  Dark brown colored urine.  Stomach pain.  Light or clay colored stool.  Itchy skin. DIAGNOSIS   Your history will be taken along with a physical exam.  Urine and blood tests.  Abdominal ultrasound.  CT scans.  MRI.  Liver biopsy if the liver disease is suspected.  Endoscopic retrograde cholangiopancreatography (ERCP). TREATMENT  Treatment depends on the cause or related to the treatment of an underlying condition. For example, if jaundice is caused by gallstones, the stones or gallbladder may need to be removed. Other treatments may include:  Rest.  Stopping a certain medicine if it is causing the jaundice.  Giving fluid through the vein (IV fluids).  Surgery (removing gallstones, cancers). Some conditions that cause jaundice can be fatal if not treated. HOME CARE INSTRUCTIONS   Rest.  Drink enough fluids to keep your urine clear or pale yellow.  Avoid all alcoholic drinks.  Only take over-the-counter or prescription medicines for nausea, vomiting, itching, pain, discomfort, or fever as directed by your caregiver.  If jaundice is due to viral hepatitis or an infection:  Avoid close contact with people.  Avoid preparing food for others.  Avoid sharing utensils with others.  Wash your hands often.  Keep all  follow-up appointments with your caregiver.  Use skin lotions to relieve itching. SEEK IMMEDIATE MEDICAL CARE IF:   You have increased pain.  You have repeated vomiting.  You become dehydrated.  You have a fever or persistent symptoms for more than 72 hours.  You have a fever and your symptoms suddenly get worse.  You become weak or confused.  You develop a severe headache. MAKE SURE YOU:   Understand these instructions.  Will watch your condition.  Will get help right away if you are not doing well or get worse. Document Released: 10/29/2005 Document Revised: 01/21/2012 Document Reviewed: 10/13/2010 [facility] Patient Information 2014 [place], [place]. Hepatitis C Hepatitis C is a viral infection of the liver. Infection may go undetected for months or years because symptoms may be absent or very mild. Chronic liver disease is the main danger of hepatitis C. This may lead to scarring of the liver (cirrhosis), liver failure, and liver cancer. CAUSES  Hepatitis C is caused by the hepatitis C virus (HCV). Formerly, hepatitis C infections were most commonly transmitted through blood transfusions. In the early 1990s, routine testing of donated blood for hepatitis C and exclusion of blood that tests positive for HCV began. Now, HCV is most commonly transmitted from person to person through injection drug use, sharing needles, or sex with an infected person. A caregiver may also get the infection from exposure to the blood of an infected patient by way of a cut or needle stick.  SYMPTOMS  Acute Phase Many cases of acute HCV infection are mild and cause few problems.Some people may not even realize they are sick.Symptoms in others may last a  few weeks to several months and include:  Feeling very tired.  Loss of appetite.  Nausea.  Vomiting.  Abdominal pain.  Dark yellow urine.  Yellow skin and eyes (jaundice).  Itching of the skin. Chronic Phase  Between 50% to 85% of  people who get HCV infection become "chronic carriers." They often have no symptoms, but the virus stays in their body.They may spread the virus to others and can get long-term liver disease.  Many people with chronic HCV infection remain healthy for many years. However, up to 1 in 5 chronically infected people may develop severe liver diseases including scarring of the liver (cirrhosis), liver failure, or liver cancer. DIAGNOSIS  Diagnosis of hepatitis C infection is made by testing blood for the presence of hepatitis C viral particles called RNA. Other tests may also be done to measure the status of current liver function, exclude other liver problems, or assess liver damage. TREATMENT  Treatment with many antiviral drugs is available and recommended for some patients with chronic HCV infection. Drug treatment is generally considered appropriate for patients who:  Are 1 years of age or older.  Have a positive test for HCV particles in the blood.  Have a liver tissue sample (biopsy) that shows chronic hepatitis and significant scarring (fibrosis).  Do not have signs of liver failure.  Have acceptable blood test results that confirm the wellness of other body organs.  Are willing to be treated and conform to treatment requirements.  Have no other circumstances that would prevent treatment from being recommended (contraindications). All people who are offered and choose to receive drug treatment must understand that careful medical follow up for many months and even years is crucial in order to make successful care possible. The goal of drug treatment is to eliminate any evidence of HCV in the blood on a long-term basis. This is called a "sustained virologic response" or SVR. Achieving a SVR is associated with a decrease in the chance of life-threatening liver problems, need for a liver transplant, liver cancer rates, and liver-related complications. Successful treatment currently requires  taking treatment drugs for at least 24 weeks and up to 72 weeks. An injected drug (interferon) given weekly and an oral antiviral medicine taken daily are usually prescribed. Side effects from these drugs are common and some may be very serious. Your response to treatment must be carefully monitored by both you and your caregiver throughout the entire treatment period. PREVENTION There is no vaccine for hepatitis C. The only way to prevent the disease is to reduce the risk of exposure to the virus.   Avoid sharing drug needles or personal items like toothbrushes, razors, and nail clippers with an infected person.  Healthcare workers need to avoid injuries and wear appropriate protective equipment such as gloves, gowns, and face masks when performing invasive medical or nursing procedures. HOME CARE INSTRUCTIONS  To avoid making your liver disease worse:  Strictly avoid drinking alcohol.  Carefully review all new prescriptions of medicines with your caregiver. Ask your caregiver which drugs you should avoid. The following drugs are toxic to the liver, and your caregiver may tell you to avoid them:  Isoniazid.  Methyldopa.  Acetaminophen.  Anabolic steroids (muscle-building drugs).  Erythromycin.  Oral contraceptives (birth control pills).  Check with your caregiver to make sure medicine you are currently taking will not be harmful.  Periodic blood tests may be required. Follow your caregiver's advice about when you should have blood tests.  Avoid a sexual relationship  until advised otherwise by your caregiver.  Avoid activities that could expose other people to your blood. Examples include sharing a toothbrush, nail clippers, razors, and needles.  Bed rest is not necessary, but it may make you feel better. Recovery time is not related to the amount of rest you receive.  This infection is contagious. Follow your caregiver's instructions in order to avoid spread of the  infection. SEEK IMMEDIATE MEDICAL CARE IF:  You have increasing fatigue or weakness.  You have an oral temperature above 102 F (38.9 C), not controlled by medicine.  You develop loss of appetite, nausea, or vomiting.  You develop jaundice.  You develop easy bruising or bleeding.  You develop any severe problems as a result of your treatment. MAKE SURE YOU:   Understand these instructions.  Will watch your condition.  Will get help right away if you are not doing well or get worse. Document Released: 10/26/2000 Document Revised: 01/21/2012 Document Reviewed: 02/28/2011 [facility] Patient Information 2014 [place], [place]. Cirrhosis Cirrhosis is a condition of scarring of the liver which is caused when the liver has tried repairing itself following damage. This damage may come from a previous infection such as one of the forms of hepatitis (usually hepatitis C), or the damage may come from being injured by toxins. The main toxin that causes this damage is alcohol. The scarring of the liver from use of alcohol is irreversible. That means the liver cannot return to normal even though alcohol is not used any more. The main danger of hepatitis C infection is that it may cause long-lasting (chronic) liver disease, and this also may lead to cirrhosis. This complication is progressive and irreversible. CAUSES  Prior to available blood tests, hepatitis C could be contracted by blood transfusions. Since testing of blood has improved, this is now unlikely. This infection can also be contracted through intravenous drug use and the sharing of needles. It can also be contracted through sexual relationships. The injury caused by alcohol comes from too much use. It is not a few drinks that poison the liver, but years of misuse. Usually there will be some signs and symptoms early with scarring of the liver that suggest the development of better habits. Alcohol should never be used while using acetaminophen. A  small dose of both taken together may cause irreversible damage to the liver. HOME CARE INSTRUCTIONS  There is no specific treatment for cirrhosis. However, there are things you can do to avoid making the condition worse.  Rest as needed.  Eat a well-balanced diet. Your caregiver can help you with suggestions.  Vitamin supplements including vitamins A, K, D, and thiamine can help.  A low-salt diet, water restriction, or diuretic medicine may be needed to reduce fluid retention.  Avoid alcohol. This can be extremely toxic if combined with acetaminophen.  Avoid drugs which are toxic to the liver. Some of these include isoniazid, methyldopa, acetaminophen, anabolic steroids (muscle-building drugs), erythromycin, and oral contraceptives (birth control pills). Check with your caregiver to make sure medicines you are presently taking will not be harmful.  Periodic blood tests may be required. Follow your caregiver's advice regarding the timing of these.  Milk thistle is an herbal remedy which does protect the liver against toxins. However, it will not help once the liver has been scarred. SEEK MEDICAL CARE IF:  You have increasing fatigue or weakness.  You develop swelling of the hands, feet, legs, or face.  You vomit bright red blood, or a coffee ground  appearing material.  You have blood in your stools, or the stools turn black and tarry.  You have a fever.  You develop loss of appetite, or have nausea and vomiting.  You develop jaundice.  You develop easy bruising or bleeding.  You have worsening of any of the problems you are concerned about. Document Released: 10/29/2005 Document Revised: 01/21/2012 Document Reviewed: 06/16/2008 [facility] Patient Information 2014 [place], [place].

## 2013-05-21 ENCOUNTER — Ambulatory Visit: Payer: 59

## 2013-05-21 LAB — COMPLETE METABOLIC PANEL WITH GFR
ALT: 146 U/L — ABNORMAL HIGH (ref 0–35)
AST: 156 U/L — ABNORMAL HIGH (ref 0–37)
Alkaline Phosphatase: 174 U/L — ABNORMAL HIGH (ref 39–117)
BUN: 6 mg/dL (ref 6–23)
Calcium: 9.6 mg/dL (ref 8.4–10.5)
Chloride: 101 mEq/L (ref 96–112)
Creat: 0.81 mg/dL (ref 0.50–1.10)
Potassium: 4.5 mEq/L (ref 3.5–5.3)

## 2013-05-21 LAB — HIV ANTIBODY (ROUTINE TESTING W REFLEX): HIV: NONREACTIVE

## 2013-05-22 NOTE — Progress Notes (Signed)
Quick Note:  Please inform patient that her labs show improvement. Her liver enzymes are starting to show some improvement. They are still elevated but coming down. HIV test came back negative.   Rodney Langton, MD, CDE, FAAFP [facility] [place], [place]   ______

## 2013-05-26 ENCOUNTER — Telehealth: Payer: Self-pay | Admitting: *Deleted

## 2013-05-26 NOTE — Telephone Encounter (Signed)
05/26/13 Patient made aware that her lab shows improvement .HIV was negative per Dr. Laural Benes P.[facility]

## 2013-05-27 ENCOUNTER — Other Ambulatory Visit (HOSPITAL_COMMUNITY): Payer: Self-pay | Admitting: Gastroenterology

## 2013-05-27 ENCOUNTER — Telehealth: Payer: Self-pay | Admitting: Family Medicine

## 2013-05-27 DIAGNOSIS — R1110 Vomiting, unspecified: Secondary | ICD-10-CM

## 2013-05-27 NOTE — Telephone Encounter (Signed)
Pt calling again about med refills.  Please call pt to clarify which meds she need refilled.

## 2013-05-27 NOTE — Telephone Encounter (Signed)
Pt would like oxycodine & ondansetron called in to the [facility] off of [address].

## 2013-05-28 ENCOUNTER — Telehealth: Payer: Self-pay | Admitting: Family Medicine

## 2013-05-28 ENCOUNTER — Other Ambulatory Visit: Payer: Self-pay | Admitting: Internal Medicine

## 2013-05-28 ENCOUNTER — Ambulatory Visit (INDEPENDENT_AMBULATORY_CARE_PROVIDER_SITE_OTHER): Payer: No Typology Code available for payment source | Admitting: Internal Medicine

## 2013-05-28 ENCOUNTER — Encounter: Payer: Self-pay | Admitting: Internal Medicine

## 2013-05-28 VITALS — BP 111/76 | HR 96 | Temp 98.5°F | Ht 66.0 in | Wt 103.0 lb

## 2013-05-28 DIAGNOSIS — K7460 Unspecified cirrhosis of liver: Secondary | ICD-10-CM

## 2013-05-28 DIAGNOSIS — B182 Chronic viral hepatitis C: Secondary | ICD-10-CM | POA: Insufficient documentation

## 2013-05-28 DIAGNOSIS — B0223 Postherpetic polyneuropathy: Secondary | ICD-10-CM

## 2013-05-28 DIAGNOSIS — B1921 Unspecified viral hepatitis C with hepatic coma: Secondary | ICD-10-CM

## 2013-05-28 HISTORY — DX: Postherpetic polyneuropathy: B02.23

## 2013-05-28 NOTE — Assessment & Plan Note (Signed)
This is improving and she is followed by gastroenterology

## 2013-05-28 NOTE — Telephone Encounter (Signed)
Pt is needing to have scripts refilled and would like to be called back

## 2013-05-28 NOTE — Assessment & Plan Note (Signed)
This is improving and she has completed her treatment. She can continue with gabapentin.

## 2013-05-28 NOTE — Assessment & Plan Note (Signed)
She is followed by gastroenterology. She was reminded to abstain from alcohol and to avoid acetaminophen.

## 2013-05-28 NOTE — Progress Notes (Signed)
  Subjective:    Patient ID: Kara Bailey, female    DOB: 11/14/1955, 57 y.o.   MRN: [id number]  HPI She comes in for a new patient evaluation. She recently had shingles noted during the hospitalization.  She received Valtrex and gabapentin. She is continuing on gabapentin. Her shingles have dried. She still has some pain. She never had shingles before. Her HIV test was negative. Her hepatitis C virus is active. She quit alcohol about 5 weeks ago. She did get hospitalized due to progressive jaundice and renal insufficiency. This has improved some although she has some baseline jaundice. She does complain of some abdominal fullness which sounds like ascites. Poor appetite and she has had weight loss. No diarrhea. She has seen gastroenterology as well. No fever or chills.   Review of Systems  Constitutional: Positive for activity change, appetite change, fatigue and unexpected weight change. Negative for fever.  HENT: Negative for sore throat and trouble swallowing.   Eyes: Negative for photophobia and visual disturbance.  Gastrointestinal: Negative for nausea and diarrhea.  Genitourinary: Negative for hematuria.  Musculoskeletal: Negative for myalgias.  Skin: Positive for rash.       Improved compared to report  Neurological: Negative for dizziness, light-headedness and headaches.  Hematological: Negative for adenopathy.  Psychiatric/Behavioral: Negative for dysphoric mood.       Objective:   Physical Exam  Constitutional: She appears well-developed and well-nourished. No distress.  HENT:  Mouth/Throat: No oropharyngeal exudate.  Eyes: Right eye exhibits no discharge. Left eye exhibits no discharge. Scleral icterus is present.  Cardiovascular: Normal rate, regular rhythm and normal heart sounds.   No murmur heard. Pulmonary/Chest: Effort normal and breath sounds normal. No respiratory distress. She has no wheezes.  Lymphadenopathy:    She has no cervical adenopathy.  Skin: No rash  noted.  Psychiatric: She has a normal mood and affect.          Assessment & Plan:

## 2013-05-28 NOTE — Telephone Encounter (Signed)
05/28/13 Patient requesting refills on Oxycodone and Zofran. Please have doctor to print prescription and patient  Stated she will pick up today after 3:00 p.m. Thanks P.[facility]

## 2013-05-28 NOTE — Telephone Encounter (Signed)
05/28/13 Patient  Requesting a refill on Oxycodone and Zofran. Will come and pick up prescription  After 3:00 p.m. Thanks P.[facility]

## 2013-05-29 ENCOUNTER — Ambulatory Visit (HOSPITAL_COMMUNITY)
Admission: RE | Admit: 2013-05-29 | Discharge: 2013-05-29 | Disposition: A | Discharge status: Home / Self Care | Payer: No Typology Code available for payment source | Source: Ambulatory Visit | Attending: Gastroenterology | Admitting: Gastroenterology

## 2013-05-29 DIAGNOSIS — R1110 Vomiting, unspecified: Secondary | ICD-10-CM

## 2013-06-02 ENCOUNTER — Ambulatory Visit: Payer: 59

## 2013-06-02 ENCOUNTER — Ambulatory Visit (HOSPITAL_COMMUNITY)
Admission: RE | Admit: 2013-06-02 | Discharge: 2013-06-02 | Disposition: A | Discharge status: Home / Self Care | Payer: No Typology Code available for payment source | Source: Ambulatory Visit | Attending: Gastroenterology | Admitting: Gastroenterology

## 2013-06-02 DIAGNOSIS — R109 Unspecified abdominal pain: Secondary | ICD-10-CM | POA: Insufficient documentation

## 2013-06-02 DIAGNOSIS — K449 Diaphragmatic hernia without obstruction or gangrene: Secondary | ICD-10-CM | POA: Insufficient documentation

## 2013-11-02 ENCOUNTER — Ambulatory Visit: Payer: 59

## 2013-11-27 ENCOUNTER — Ambulatory Visit: Payer: 59 | Attending: Internal Medicine

## 2013-12-09 ENCOUNTER — Ambulatory Visit: Payer: No Typology Code available for payment source | Attending: Internal Medicine | Admitting: Internal Medicine

## 2013-12-09 ENCOUNTER — Encounter: Payer: Self-pay | Admitting: Internal Medicine

## 2013-12-09 VITALS — BP 134/84 | HR 85 | Temp 98.8°F | Resp 17 | Wt 110.0 lb

## 2013-12-09 DIAGNOSIS — Z760 Encounter for issue of repeat prescription: Secondary | ICD-10-CM

## 2013-12-09 DIAGNOSIS — B1920 Unspecified viral hepatitis C without hepatic coma: Secondary | ICD-10-CM

## 2013-12-09 DIAGNOSIS — F411 Generalized anxiety disorder: Secondary | ICD-10-CM

## 2013-12-09 MED ORDER — GABAPENTIN 600 MG PO TABS: 600.0000 mg | ORAL_TABLET | Freq: Two times a day (BID) | ORAL | Status: DC

## 2013-12-09 MED ORDER — ALPRAZOLAM 0.5 MG PO TABS: 0.5000 mg | ORAL_TABLET | Freq: Three times a day (TID) | ORAL | Status: DC | PRN

## 2013-12-09 NOTE — Progress Notes (Signed)
MRN: [id number] Name: Kara Bailey  Sex: female Age: 58 y.o. DOB: [birth year]  Allergies: Review of patient's allergies indicates no known allergies.  Chief Complaint  Patient presents with  . Medication Refill    HPI: Patient is 58 y.o. female who reported to have abdominal pain a few weeks ago and patient scheduled this appointment, she reports improvement in the symptoms denies any more abdominal pain nausea vomiting, previous medical history noted , she is history of hepatitis C and she has lost follow up with the gastroenterology never been treated, she denies any change in bowel habits, she is history of anxiety and is requesting refill on her medications.  Past Medical History  Diagnosis Date  . Hepatitis C   . Shingles     left side  . Hepatitis C virus infection     Past Surgical History  Procedure Laterality Date  . Appendectomy        Medication List       This list is accurate as of: 12/09/13 12:36 PM.  Always use your most recent med list.               ALPRAZolam 0.5 MG tablet  Commonly known as:  XANAX  Take 1 tablet (0.5 mg total) by mouth 3 (three) times daily as needed for anxiety.     feeding supplement (ENSURE COMPLETE) Liqd  Take 237 mLs by mouth 2 (two) times daily between meals.     folic acid 1 MG tablet  Commonly known as:  FOLVITE  Take 1 tablet (1 mg total) by mouth daily.     gabapentin 600 MG tablet  Commonly known as:  NEURONTIN  Take 1 tablet (600 mg total) by mouth 2 (two) times daily.     ondansetron 4 MG tablet  Commonly known as:  ZOFRAN  Take 1 tablet (4 mg total) by mouth every 6 (six) hours as needed for nausea.     oxyCODONE 5 MG immediate release tablet  Commonly known as:  Oxy IR/ROXICODONE  Take 1 tablet (5 mg total) by mouth every 8 (eight) hours as needed.     thiamine 100 MG tablet  Take 1 tablet (100 mg total) by mouth daily.     valACYclovir 1000 MG tablet  Commonly known as:  VALTREX  Take 1 tablet (1,000  mg total) by mouth 3 (three) times daily.        Meds ordered this encounter  Medications  . ALPRAZolam (XANAX) 0.5 MG tablet    Sig: Take 1 tablet (0.5 mg total) by mouth 3 (three) times daily as needed for anxiety.    Dispense:  20 tablet    Refill:  0  . gabapentin (NEURONTIN) 600 MG tablet    Sig: Take 1 tablet (600 mg total) by mouth 2 (two) times daily.    Dispense:  60 tablet    Refill:  2     There is no immunization history on file for this patient.  Family History  Problem Relation Age of Onset  . Cancer Neg Hx     History  Substance Use Topics  . Smoking status: Current Some Day Smoker  . Smokeless tobacco: Not on file  . Alcohol Use: Yes     Comment: stopped on saturday    Review of Systems  As noted in HPI  Filed Vitals:   12/09/13 1212  BP: 134/84  Pulse: 85  Temp: 98.8 F (37.1 C)  Resp: 17  Physical Exam  Physical Exam  Eyes: EOM are normal. Pupils are equal, round, and reactive to light.  Minimal scleral icterus  Cardiovascular: Normal rate.   Pulmonary/Chest: Breath sounds normal. No respiratory distress. She has no wheezes. She has no rales.  Abdominal: Soft. There is no tenderness. There is no rebound and no guarding.    CBC    Component Value Date/Time   WBC 10.1 05/20/2013 1330   RBC 4.61 05/20/2013 1330   HGB 15.0 05/20/2013 1330   HCT 43.5 05/20/2013 1330   PLT 222 05/20/2013 1330   MCV 94.4 05/20/2013 1330   LYMPHSABS 2.3 05/04/2013 1618   MONOABS 0.9 05/04/2013 1618   EOSABS 0.1 05/04/2013 1618   BASOSABS 0.1 05/04/2013 1618    CMP     Component Value Date/Time   NA 138 05/20/2013 1330   K 4.5 05/20/2013 1330   CL 101 05/20/2013 1330   CO2 29 05/20/2013 1330   GLUCOSE 104* 05/20/2013 1330   BUN 6 05/20/2013 1330   CREATININE 0.81 05/20/2013 1330   CREATININE 0.60 05/07/2013 0600   CALCIUM 9.6 05/20/2013 1330   PROT 6.6 05/20/2013 1330   ALBUMIN 3.9 05/20/2013 1330   AST 156* 05/20/2013 1330   ALT 146* 05/20/2013 1330   ALKPHOS 174* 05/20/2013  1330   BILITOT 13.1* 05/20/2013 1330   GFRNONAA >90 05/07/2013 0600   GFRAA >90 05/07/2013 0600    No results found for this basename: chol, tri, ldl    No components found with this basename: hga1c    Lab Results  Component Value Date/Time   AST 156* 05/20/2013  1:30 PM    Assessment and Plan  Hepatitis C infection - Plan: Ambulatory referral to Gastroenterology  Anxiety state, unspecified - Plan: ALPRAZolam (XANAX) 0.5 MG tablet  Medication refill - Plan: gabapentin (NEURONTIN) 600 MG tablet   No Follow-up on file.  [name], MD

## 2013-12-09 NOTE — Progress Notes (Signed)
Patient here for medication refill Has history of lung problem  And Hep C

## 2014-06-15 ENCOUNTER — Ambulatory Visit: Payer: 59 | Attending: Internal Medicine | Admitting: Internal Medicine

## 2014-06-15 ENCOUNTER — Encounter: Payer: Self-pay | Admitting: Internal Medicine

## 2014-06-15 VITALS — BP 152/94 | HR 98 | Temp 99.2°F | Resp 16 | Wt 110.4 lb

## 2014-06-15 DIAGNOSIS — R1013 Epigastric pain: Secondary | ICD-10-CM

## 2014-06-15 DIAGNOSIS — M62830 Muscle spasm of back: Secondary | ICD-10-CM

## 2014-06-15 DIAGNOSIS — R030 Elevated blood-pressure reading, without diagnosis of hypertension: Secondary | ICD-10-CM

## 2014-06-15 DIAGNOSIS — B1920 Unspecified viral hepatitis C without hepatic coma: Secondary | ICD-10-CM | POA: Insufficient documentation

## 2014-06-15 DIAGNOSIS — F411 Generalized anxiety disorder: Secondary | ICD-10-CM | POA: Insufficient documentation

## 2014-06-15 DIAGNOSIS — K3189 Other diseases of stomach and duodenum: Secondary | ICD-10-CM

## 2014-06-15 DIAGNOSIS — M545 Low back pain, unspecified: Secondary | ICD-10-CM | POA: Insufficient documentation

## 2014-06-15 DIAGNOSIS — M5380 Other specified dorsopathies, site unspecified: Secondary | ICD-10-CM

## 2014-06-15 DIAGNOSIS — R109 Unspecified abdominal pain: Secondary | ICD-10-CM | POA: Insufficient documentation

## 2014-06-15 DIAGNOSIS — F17200 Nicotine dependence, unspecified, uncomplicated: Secondary | ICD-10-CM | POA: Insufficient documentation

## 2014-06-15 DIAGNOSIS — IMO0001 Reserved for inherently not codable concepts without codable children: Secondary | ICD-10-CM

## 2014-06-15 LAB — POCT URINALYSIS DIPSTICK
Glucose, UA: NEGATIVE
Ketones, UA: NEGATIVE
Leukocytes, UA: NEGATIVE
Nitrite, UA: NEGATIVE
PROTEIN UA: NEGATIVE
SPEC GRAV UA: 1.015
UROBILINOGEN UA: 0.2
pH, UA: 5

## 2014-06-15 MED ORDER — OMEPRAZOLE 20 MG PO CPDR: 20.0000 mg | DELAYED_RELEASE_CAPSULE | Freq: Every day | ORAL | Status: DC

## 2014-06-15 MED ORDER — ALPRAZOLAM 0.5 MG PO TABS: 0.5000 mg | ORAL_TABLET | Freq: Three times a day (TID) | ORAL | Status: DC | PRN

## 2014-06-15 MED ORDER — CYCLOBENZAPRINE HCL 10 MG PO TABS: 10.0000 mg | ORAL_TABLET | Freq: Three times a day (TID) | ORAL | Status: DC | PRN

## 2014-06-15 NOTE — Progress Notes (Signed)
Patient complains of lower right flank back  pain for about a month Denies any problems with voiding Also complains of some discomfort in her upper abd area

## 2014-06-15 NOTE — Patient Instructions (Signed)
DASH Eating Plan °DASH stands for "Dietary Approaches to Stop Hypertension." The DASH eating plan is a healthy eating plan that has been shown to reduce high blood pressure (hypertension). Additional health benefits may include reducing the risk of type 2 diabetes mellitus, heart disease, and stroke. The DASH eating plan may also help with weight loss. °WHAT DO I NEED TO KNOW ABOUT THE DASH EATING PLAN? °For the DASH eating plan, you will follow these general guidelines: °· Choose foods with a percent daily value for sodium of less than 5% (as listed on the food label). °· Use salt-free seasonings or herbs instead of table salt or sea salt. °· Check with your health care provider or pharmacist before using salt substitutes. °· Eat lower-sodium products, often labeled as "lower sodium" or "no salt added." °· Eat fresh foods. °· Eat more vegetables, fruits, and low-fat dairy products. °· Choose whole grains. Look for the word "whole" as the first word in the ingredient list. °· Choose fish and skinless chicken or turkey more often than red meat. Limit fish, poultry, and meat to 6 oz (170 g) each day. °· Limit sweets, desserts, sugars, and sugary drinks. °· Choose heart-healthy fats. °· Limit cheese to 1 oz (28 g) per day. °· Eat more home-cooked food and less restaurant, buffet, and fast food. °· Limit fried foods. °· Cook foods using methods other than frying. °· Limit canned vegetables. If you do use them, rinse them well to decrease the sodium. °· When eating at a restaurant, ask that your food be prepared with less salt, or no salt if possible. °WHAT FOODS CAN I EAT? °Seek help from a dietitian for individual calorie needs. °Grains °Whole grain or whole wheat bread. Brown rice. Whole grain or whole wheat pasta. Quinoa, bulgur, and whole grain cereals. Low-sodium cereals. Corn or whole wheat flour tortillas. Whole grain cornbread. Whole grain crackers. Low-sodium crackers. °Vegetables °Fresh or frozen vegetables  (raw, steamed, roasted, or grilled). Low-sodium or reduced-sodium tomato and vegetable juices. Low-sodium or reduced-sodium tomato sauce and paste. Low-sodium or reduced-sodium canned vegetables.  °Fruits °All fresh, canned (in natural juice), or frozen fruits. °Meat and Other Protein Products °Ground beef (85% or leaner), grass-fed beef, or beef trimmed of fat. Skinless chicken or turkey. Ground chicken or turkey. Pork trimmed of fat. All fish and seafood. Eggs. Dried beans, peas, or lentils. Unsalted nuts and seeds. Unsalted canned beans. °Dairy °Low-fat dairy products, such as skim or 1% milk, 2% or reduced-fat cheeses, low-fat ricotta or cottage cheese, or plain low-fat yogurt. Low-sodium or reduced-sodium cheeses. °Fats and Oils °Tub margarines without trans fats. Light or reduced-fat mayonnaise and salad dressings (reduced sodium). Avocado. Safflower, olive, or canola oils. Natural peanut or almond butter. °Other °Unsalted popcorn and pretzels. °The items listed above may not be a complete list of recommended foods or beverages. Contact your dietitian for more options. °WHAT FOODS ARE NOT RECOMMENDED? °Grains °White bread. White pasta. White rice. Refined cornbread. Bagels and croissants. Crackers that contain trans fat. °Vegetables °Creamed or fried vegetables. Vegetables in a cheese sauce. Regular canned vegetables. Regular canned tomato sauce and paste. Regular tomato and vegetable juices. °Fruits °Dried fruits. Canned fruit in light or heavy syrup. Fruit juice. °Meat and Other Protein Products °Fatty cuts of meat. Ribs, chicken wings, bacon, sausage, bologna, salami, chitterlings, fatback, hot dogs, bratwurst, and packaged luncheon meats. Salted nuts and seeds. Canned beans with salt. °Dairy °Whole or 2% milk, cream, half-and-half, and cream cheese. Whole-fat or sweetened yogurt. Full-fat   cheeses or blue cheese. Nondairy creamers and whipped toppings. Processed cheese, cheese spreads, or cheese  curds. °Condiments °Onion and garlic salt, seasoned salt, table salt, and sea salt. Canned and packaged gravies. Worcestershire sauce. Tartar sauce. Barbecue sauce. Teriyaki sauce. Soy sauce, including reduced sodium. Steak sauce. Fish sauce. Oyster sauce. Cocktail sauce. Horseradish. Ketchup and mustard. Meat flavorings and tenderizers. Bouillon cubes. Hot sauce. Tabasco sauce. Marinades. Taco seasonings. Relishes. °Fats and Oils °Butter, stick margarine, lard, shortening, ghee, and bacon fat. Coconut, palm kernel, or palm oils. Regular salad dressings. °Other °Pickles and olives. Salted popcorn and pretzels. °The items listed above may not be a complete list of foods and beverages to avoid. Contact your dietitian for more information. °WHERE CAN I FIND MORE INFORMATION? °[employer], [employer], [employer]: www.nhlbi.nih.gov/health/health-topics/topics/dash/ °Document Released: 10/18/2011 Document Revised: 03/15/2014 Document Reviewed: 09/02/2013 °Exit[employer]® Patient Information ©2015 [employer]. This information is not intended to replace advice given to you by your health care provider. Make sure you discuss any questions you have with your health care provider. ° °

## 2014-06-15 NOTE — Progress Notes (Signed)
MRN: [id number] Name: Kara Bailey  Sex: female Age: 58 y.o. DOB: [birth year]  Allergies: Review of patient's allergies indicates no known allergies.  Chief Complaint  Patient presents with  . Back Pain    HPI: Patient is 58 y.o. female who comes today reported to have right lower back pain for the last 4-5 weeks denies any fall or trauma, as per patient she took some Aleve which helped for a while, denies any nausea vomiting change in bowel habits denies any urinary symptoms, she also reported to have some upper abdominal discomfort and some reflux symptoms, she does smoke cigarettes, have advised patient to quit smoking, patient also history of anxiety and is requesting refill on Xanax which helps her with the symptoms.  Past Medical History  Diagnosis Date  . Hepatitis C   . Shingles     left side  . Hepatitis C virus infection     Past Surgical History  Procedure Laterality Date  . Appendectomy        Medication List       This list is accurate as of: 06/15/14 11:07 AM.  Always use your most recent med list.               ALPRAZolam 0.5 MG tablet  Commonly known as:  XANAX  Take 1 tablet (0.5 mg total) by mouth 3 (three) times daily as needed for anxiety.     cyclobenzaprine 10 MG tablet  Commonly known as:  FLEXERIL  Take 1 tablet (10 mg total) by mouth 3 (three) times daily as needed for muscle spasms.     feeding supplement (ENSURE COMPLETE) Liqd  Take 237 mLs by mouth 2 (two) times daily between meals.     folic acid 1 MG tablet  Commonly known as:  FOLVITE  Take 1 tablet (1 mg total) by mouth daily.     gabapentin 600 MG tablet  Commonly known as:  NEURONTIN  Take 1 tablet (600 mg total) by mouth 2 (two) times daily.     omeprazole 20 MG capsule  Commonly known as:  PRILOSEC  Take 1 capsule (20 mg total) by mouth daily.     ondansetron 4 MG tablet  Commonly known as:  ZOFRAN  Take 1 tablet (4 mg total) by mouth every 6 (six) hours as needed  for nausea.     oxyCODONE 5 MG immediate release tablet  Commonly known as:  Oxy IR/ROXICODONE  Take 1 tablet (5 mg total) by mouth every 8 (eight) hours as needed.     thiamine 100 MG tablet  Take 1 tablet (100 mg total) by mouth daily.     valACYclovir 1000 MG tablet  Commonly known as:  VALTREX  Take 1 tablet (1,000 mg total) by mouth 3 (three) times daily.        Meds ordered this encounter  Medications  . cyclobenzaprine (FLEXERIL) 10 MG tablet    Sig: Take 1 tablet (10 mg total) by mouth 3 (three) times daily as needed for muscle spasms.    Dispense:  30 tablet    Refill:  0  . omeprazole (PRILOSEC) 20 MG capsule    Sig: Take 1 capsule (20 mg total) by mouth daily.    Dispense:  30 capsule    Refill:  3  . ALPRAZolam (XANAX) 0.5 MG tablet    Sig: Take 1 tablet (0.5 mg total) by mouth 3 (three) times daily as needed for anxiety.    Dispense:  20 tablet    Refill:  0     There is no immunization history on file for this patient.  Family History  Problem Relation Age of Onset  . Cancer Neg Hx     History  Substance Use Topics  . Smoking status: Current Some Day Smoker  . Smokeless tobacco: Not on file  . Alcohol Use: Yes     Comment: stopped on saturday    Review of Systems   As noted in HPI  Filed Vitals:   06/15/14 1030  BP: 152/94  Pulse: 98  Temp: 99.2 F (37.3 C)  Resp: 16    Physical Exam  Physical Exam  Constitutional: No distress.  Eyes: EOM are normal. Pupils are equal, round, and reactive to light.  Cardiovascular: Normal rate and regular rhythm.   Pulmonary/Chest: Breath sounds normal. No respiratory distress. She has no wheezes. She has no rales.  Abdominal: Soft. There is no tenderness. There is no rebound and no guarding.  Right lower lumbar paraspinal tenderness with SLR test pt complains of pulling sensation in back, DTR2+ equal strength both lower extremities   Musculoskeletal: She exhibits no edema.    CBC    Component  Value Date/Time   WBC 10.1 05/20/2013 1330   RBC 4.61 05/20/2013 1330   HGB 15.0 05/20/2013 1330   HCT 43.5 05/20/2013 1330   PLT 222 05/20/2013 1330   MCV 94.4 05/20/2013 1330   LYMPHSABS 2.3 05/04/2013 1618   MONOABS 0.9 05/04/2013 1618   EOSABS 0.1 05/04/2013 1618   BASOSABS 0.1 05/04/2013 1618    CMP     Component Value Date/Time   NA 138 05/20/2013 1330   K 4.5 05/20/2013 1330   CL 101 05/20/2013 1330   CO2 29 05/20/2013 1330   GLUCOSE 104* 05/20/2013 1330   BUN 6 05/20/2013 1330   CREATININE 0.81 05/20/2013 1330   CREATININE 0.60 05/07/2013 0600   CALCIUM 9.6 05/20/2013 1330   PROT 6.6 05/20/2013 1330   ALBUMIN 3.9 05/20/2013 1330   AST 156* 05/20/2013 1330   ALT 146* 05/20/2013 1330   ALKPHOS 174* 05/20/2013 1330   BILITOT 13.1* 05/20/2013 1330   GFRNONAA 80 05/20/2013 1330   GFRNONAA >90 05/07/2013 0600   GFRAA >89 05/20/2013 1330   GFRAA >90 05/07/2013 0600    No results found for this basename: chol,  tri,  ldl    No components found with this basename: hga1c    Lab Results  Component Value Date/Time   AST 156* 05/20/2013  1:30 PM    Assessment and Plan  Back muscle spasm - Plan: Advised patient to apply heating pad, prescribed her cyclobenzaprine (FLEXERIL) 10 MG tablet  Dyspepsia - Plan: Trial of omeprazole (PRILOSEC) 20 MG capsule  Anxiety state, unspecified - Plan: ALPRAZolam (XANAX) 0.5 MG tablet refill done  Flank pain - Plan:  Results for orders placed in visit on 06/15/14  POCT URINALYSIS DIPSTICK      Result Value Ref Range   Color, UA yellow     Clarity, UA clear     Glucose, UA neg     Bilirubin, UA small     Ketones, UA neg     Spec Grav, UA 1.015     Blood, UA small     pH, UA 5.0     Protein, UA neg     Urobilinogen, UA 0.2     Nitrite, UA neg     Leukocytes, UA Negative     Urinalysis  Dipstick.negative for infection   Smoking Advised patient to quit smoking  Elevated BP  Advised patient for DASH diet.   Return in about 3 months (around 09/15/2014).  [name], MD

## 2014-07-07 ENCOUNTER — Ambulatory Visit: Payer: 59 | Attending: Internal Medicine

## 2014-07-21 ENCOUNTER — Telehealth: Payer: Self-pay | Admitting: Internal Medicine

## 2014-07-21 NOTE — Telephone Encounter (Signed)
Patient needs medication refill for Alprazolan 0.5 mg. She got 20 pills and was told by Langley Gauss she could request a refill.  Please f/u with Patient.

## 2014-07-21 NOTE — Telephone Encounter (Signed)
Pt requesting medication refill Xanax Please f/u

## 2014-07-21 NOTE — Telephone Encounter (Signed)
Patient can be given refill  

## 2014-07-23 ENCOUNTER — Telehealth: Payer: Self-pay | Admitting: Internal Medicine

## 2014-07-23 ENCOUNTER — Telehealth: Payer: Self-pay | Admitting: Emergency Medicine

## 2014-07-23 ENCOUNTER — Other Ambulatory Visit: Payer: Self-pay | Admitting: Emergency Medicine

## 2014-07-23 DIAGNOSIS — F411 Generalized anxiety disorder: Secondary | ICD-10-CM

## 2014-07-23 MED ORDER — ALPRAZOLAM 0.5 MG PO TABS: 0.5000 mg | ORAL_TABLET | Freq: Three times a day (TID) | ORAL | Status: DC | PRN

## 2014-07-23 NOTE — Telephone Encounter (Signed)
Pt. Is calling back again in regards to her medication refill request, she has not received a call from nurse in regards to this matter and it has been more than 48 hrs.Marland KitchenMarland KitchenMarland KitchenMarland Kitchenplease follow up with patient as soon as possible

## 2014-07-23 NOTE — Telephone Encounter (Signed)
Pt. Called to check on the status of the refill request she had put in. Please f/u with pt.

## 2014-08-12 DIAGNOSIS — R000 Tachycardia, unspecified: Secondary | ICD-10-CM

## 2014-08-12 DIAGNOSIS — R531 Weakness: Secondary | ICD-10-CM

## 2014-08-12 DIAGNOSIS — R079 Chest pain, unspecified: Secondary | ICD-10-CM

## 2014-08-12 DIAGNOSIS — R42 Dizziness and giddiness: Secondary | ICD-10-CM

## 2014-08-12 DIAGNOSIS — R0600 Dyspnea, unspecified: Secondary | ICD-10-CM

## 2014-08-12 HISTORY — DX: Dyspnea, unspecified: R06.00

## 2014-08-12 HISTORY — DX: Chest pain, unspecified: R07.9

## 2014-08-12 HISTORY — DX: Dizziness and giddiness: R42

## 2014-08-12 HISTORY — DX: Weakness: R53.1

## 2014-08-12 HISTORY — DX: Tachycardia, unspecified: R00.0

## 2014-09-02 ENCOUNTER — Telehealth: Payer: Self-pay | Admitting: Internal Medicine

## 2014-09-02 NOTE — Telephone Encounter (Signed)
Pt. Called to request a refill for ALPRAZolam (XANAX) 0.5 MG tablet. Please f/u with pt. After 3:30 p.m.

## 2014-09-03 NOTE — Telephone Encounter (Signed)
Patient is requesting a refill on her xanax Will need a prescription if we are refilling it

## 2014-09-06 NOTE — Telephone Encounter (Signed)
Patient can be given refill  

## 2014-09-07 ENCOUNTER — Telehealth: Payer: Self-pay | Admitting: Internal Medicine

## 2014-09-07 DIAGNOSIS — F411 Generalized anxiety disorder: Secondary | ICD-10-CM

## 2014-09-07 MED ORDER — ALPRAZOLAM 0.5 MG PO TABS: 0.5000 mg | ORAL_TABLET | Freq: Three times a day (TID) | ORAL | Status: DC | PRN

## 2014-09-07 NOTE — Telephone Encounter (Signed)
Pt. Calling to check on refill for Xnax. Please f/u with pt

## 2014-09-07 NOTE — Telephone Encounter (Signed)
Spoke with patient and informed her xanax rx is ready for pick up per Dr.. Annitta Needs ok to refill

## 2014-09-08 ENCOUNTER — Emergency Department (INDEPENDENT_AMBULATORY_CARE_PROVIDER_SITE_OTHER)
Admission: EM | Admit: 2014-09-08 | Discharge: 2014-09-08 | Disposition: A | Discharge status: Other Acute Inpatient Hospital | Payer: Self-pay | Source: Home / Self Care

## 2014-09-08 ENCOUNTER — Other Ambulatory Visit: Payer: Self-pay

## 2014-09-08 ENCOUNTER — Encounter (HOSPITAL_COMMUNITY): Payer: Self-pay | Admitting: Emergency Medicine

## 2014-09-08 ENCOUNTER — Emergency Department (HOSPITAL_COMMUNITY)
Admission: EM | Admit: 2014-09-08 | Discharge: 2014-09-08 | Disposition: A | Discharge status: Home / Self Care | Payer: Self-pay | Attending: Emergency Medicine | Admitting: Emergency Medicine

## 2014-09-08 ENCOUNTER — Emergency Department (HOSPITAL_COMMUNITY): Payer: Self-pay

## 2014-09-08 DIAGNOSIS — R002 Palpitations: Secondary | ICD-10-CM

## 2014-09-08 DIAGNOSIS — Z8619 Personal history of other infectious and parasitic diseases: Secondary | ICD-10-CM | POA: Insufficient documentation

## 2014-09-08 DIAGNOSIS — B182 Chronic viral hepatitis C: Secondary | ICD-10-CM

## 2014-09-08 DIAGNOSIS — J159 Unspecified bacterial pneumonia: Secondary | ICD-10-CM | POA: Insufficient documentation

## 2014-09-08 DIAGNOSIS — K7460 Unspecified cirrhosis of liver: Secondary | ICD-10-CM

## 2014-09-08 DIAGNOSIS — Z720 Tobacco use: Secondary | ICD-10-CM | POA: Insufficient documentation

## 2014-09-08 DIAGNOSIS — R079 Chest pain, unspecified: Secondary | ICD-10-CM

## 2014-09-08 DIAGNOSIS — Z79899 Other long term (current) drug therapy: Secondary | ICD-10-CM | POA: Insufficient documentation

## 2014-09-08 DIAGNOSIS — R0600 Dyspnea, unspecified: Secondary | ICD-10-CM

## 2014-09-08 DIAGNOSIS — R0602 Shortness of breath: Secondary | ICD-10-CM | POA: Insufficient documentation

## 2014-09-08 DIAGNOSIS — K769 Liver disease, unspecified: Secondary | ICD-10-CM

## 2014-09-08 DIAGNOSIS — R000 Tachycardia, unspecified: Secondary | ICD-10-CM

## 2014-09-08 DIAGNOSIS — J189 Pneumonia, unspecified organism: Secondary | ICD-10-CM

## 2014-09-08 DIAGNOSIS — R5383 Other fatigue: Secondary | ICD-10-CM | POA: Insufficient documentation

## 2014-09-08 HISTORY — DX: Palpitations: R00.2

## 2014-09-08 LAB — COMPREHENSIVE METABOLIC PANEL
ALBUMIN: 3.8 g/dL (ref 3.5–5.2)
ALK PHOS: 104 U/L (ref 39–117)
ALT: 63 U/L — ABNORMAL HIGH (ref 0–35)
AST: 63 U/L — ABNORMAL HIGH (ref 0–37)
Anion gap: 14 (ref 5–15)
BILIRUBIN TOTAL: 1.7 mg/dL — AB (ref 0.3–1.2)
BUN: 20 mg/dL (ref 6–23)
CHLORIDE: 96 meq/L (ref 96–112)
CO2: 25 meq/L (ref 19–32)
Calcium: 9.1 mg/dL (ref 8.4–10.5)
Creatinine, Ser: 1.24 mg/dL — ABNORMAL HIGH (ref 0.50–1.10)
GFR calc Af Amer: 54 mL/min — ABNORMAL LOW (ref >90)
GFR calc non Af Amer: 46 mL/min — ABNORMAL LOW (ref >90)
Glucose, Bld: 87 mg/dL (ref 70–99)
Potassium: 3.6 mEq/L — ABNORMAL LOW (ref 3.7–5.3)
Sodium: 135 mEq/L — ABNORMAL LOW (ref 137–147)
Total Protein: 7.2 g/dL (ref 6.0–8.3)

## 2014-09-08 LAB — CBC WITH DIFFERENTIAL/PLATELET
BASOS ABS: 0 10*3/uL (ref 0.0–0.1)
Basophils Relative: 0 % (ref 0–1)
Eosinophils Absolute: 0 10*3/uL (ref 0.0–0.7)
Eosinophils Relative: 0 % (ref 0–5)
HEMATOCRIT: 47.1 % — AB (ref 36.0–46.0)
HEMOGLOBIN: 16.1 g/dL — AB (ref 12.0–15.0)
LYMPHS PCT: 24 % (ref 12–46)
Lymphs Abs: 1.8 10*3/uL (ref 0.7–4.0)
MCH: 34.6 pg — ABNORMAL HIGH (ref 26.0–34.0)
MCHC: 34.2 g/dL (ref 30.0–36.0)
MCV: 101.3 fL — ABNORMAL HIGH (ref 78.0–100.0)
MONOS PCT: 12 % (ref 3–12)
Monocytes Absolute: 0.8 10*3/uL (ref 0.1–1.0)
NEUTROS ABS: 4.6 10*3/uL (ref 1.7–7.7)
Neutrophils Relative %: 64 % (ref 43–77)
Platelets: 143 10*3/uL — ABNORMAL LOW (ref 150–400)
RBC: 4.65 MIL/uL (ref 3.87–5.11)
RDW: 14.1 % (ref 11.5–15.5)
WBC: 7.3 10*3/uL (ref 4.0–10.5)

## 2014-09-08 LAB — D-DIMER, QUANTITATIVE: D-Dimer, Quant: <0.27 ug/mL-FEU (ref 0.00–0.48)

## 2014-09-08 LAB — TROPONIN I: Troponin I: <0.3 ng/mL (ref <0.30)

## 2014-09-08 MED ORDER — SODIUM CHLORIDE 0.9 % IV SOLN
Freq: Once | INTRAVENOUS | Status: AC
  Administered 2014-09-08: 13:00:00 via INTRAVENOUS

## 2014-09-08 MED ORDER — LEVOFLOXACIN 500 MG PO TABS
500.0000 mg | ORAL_TABLET | Freq: Once | ORAL | Status: AC
  Administered 2014-09-08: 500 mg via ORAL
  Filled 2014-09-08: qty 1

## 2014-09-08 MED ORDER — LEVOFLOXACIN 500 MG PO TABS: 500.0000 mg | ORAL_TABLET | Freq: Every day | ORAL | Status: DC

## 2014-09-08 MED ORDER — ASPIRIN 81 MG PO CHEW
324.0000 mg | CHEWABLE_TABLET | Freq: Once | ORAL | Status: AC
  Administered 2014-09-08: 324 mg via ORAL
  Filled 2014-09-08: qty 4

## 2014-09-08 NOTE — ED Notes (Signed)
Patient transported to X-ray 

## 2014-09-08 NOTE — ED Notes (Signed)
IV  NS  TKO   22   ANGIO  L  HAND  1  ATT   PLACED  ON  CARDIAC  MONITOR  AND     NASAL  O2  AT  2  L  /  MIN

## 2014-09-08 NOTE — ED Notes (Signed)
Patient back from x-Ray

## 2014-09-08 NOTE — ED Notes (Signed)
Per [facility], pt from [facility] for dizziness daily, when she feels her HR go up. Also reports some lower abd pain but denies any today. Vomited Monday none since then. 22g to [facility]. NSR, VSS

## 2014-09-08 NOTE — Discharge Instructions (Signed)
Pneumonia Pneumonia is an infection of the lungs.  CAUSES Pneumonia may be caused by bacteria or a virus. Usually, these infections are caused by breathing infectious particles into the lungs (respiratory tract). SIGNS AND SYMPTOMS   Cough.  Fever.  Chest pain.  Increased rate of breathing.  Wheezing.  Mucus production. DIAGNOSIS  If you have the common symptoms of pneumonia, your health care provider will typically confirm the diagnosis with a chest X-ray. The X-ray will show an abnormality in the lung (pulmonary infiltrate) if you have pneumonia. Other tests of your blood, urine, or sputum may be done to find the specific cause of your pneumonia. Your health care provider may also do tests (blood gases or pulse oximetry) to see how well your lungs are working. TREATMENT  Some forms of pneumonia may be spread to other people when you cough or sneeze. You may be asked to wear a mask before and during your exam. Pneumonia that is caused by bacteria is treated with antibiotic medicine. Pneumonia that is caused by the influenza virus may be treated with an antiviral medicine. Most other viral infections must run their course. These infections will not respond to antibiotics.  HOME CARE INSTRUCTIONS   Cough suppressants may be used if you are losing too much rest. However, coughing protects you by clearing your lungs. You should avoid using cough suppressants if you can.  Your health care provider may have prescribed medicine if he or she thinks your pneumonia is caused by bacteria or influenza. Finish your medicine even if you start to feel better.  Your health care provider may also prescribe an expectorant. This loosens the mucus to be coughed up.  Take medicines only as directed by your health care provider.  Do not smoke. Smoking is a common cause of bronchitis and can contribute to pneumonia. If you are a smoker and continue to smoke, your cough may last several weeks after your  pneumonia has cleared.  A cold steam vaporizer or humidifier in your room or home may help loosen mucus.  Coughing is often worse at night. Sleeping in a semi-upright position in a recliner or using a couple pillows under your head will help with this.  Get rest as you feel it is needed. Your body will usually let you know when you need to rest. PREVENTION A pneumococcal shot (vaccine) is available to prevent a common bacterial cause of pneumonia. This is usually suggested for:  People over 65 years old.  Patients on chemotherapy.  People with chronic lung problems, such as bronchitis or emphysema.  People with immune system problems. If you are over 65 or have a high risk condition, you may receive the pneumococcal vaccine if you have not received it before. In some countries, a routine influenza vaccine is also recommended. This vaccine can help prevent some cases of pneumonia.You may be offered the influenza vaccine as part of your care. If you smoke, it is time to quit. You may receive instructions on how to stop smoking. Your health care provider can provide medicines and counseling to help you quit. SEEK MEDICAL CARE IF: You have a fever. SEEK IMMEDIATE MEDICAL CARE IF:   Your illness becomes worse. This is especially true if you are elderly or weakened from any other disease.  You cannot control your cough with suppressants and are losing sleep.  You begin coughing up blood.  You develop pain which is getting worse or is uncontrolled with medicines.  Any of the symptoms   which initially brought you in for treatment are getting worse rather than better.  You develop shortness of breath or chest pain. MAKE SURE YOU:   Understand these instructions.  Will watch your condition.  Will get help right away if you are not doing well or get worse. Document Released: 10/29/2005 Document Revised: 03/15/2014 Document Reviewed: 01/18/2011 [facility] Patient Information 2015  [place], [place]. This information is not intended to replace advice given to you by your health care provider. Make sure you discuss any questions you have with your health care provider.   Palpitations A palpitation is the feeling that your heartbeat is irregular or is faster than normal. It may feel like your heart is fluttering or skipping a beat. Palpitations are usually not a serious problem. However, in some cases, you may need further medical evaluation. CAUSES  Palpitations can be caused by:  Smoking.  Caffeine or other stimulants, such as diet pills or energy drinks.  Alcohol.  Stress and anxiety.  Strenuous physical activity.  Fatigue.  Certain medicines.  Heart disease, especially if you have a history of irregular heart rhythms (arrhythmias), such as atrial fibrillation, atrial flutter, or supraventricular tachycardia.  An improperly working pacemaker or defibrillator. DIAGNOSIS  To find the cause of your palpitations, your health care provider will take your medical history and perform a physical exam. Your health care provider may also have you take a test called an ambulatory electrocardiogram (ECG). An ECG records your heartbeat patterns over a 24-hour period. You may also have other tests, such as:  Transthoracic echocardiogram (TTE). During echocardiography, sound waves are used to evaluate how blood flows through your heart.  Transesophageal echocardiogram (TEE).  Cardiac monitoring. This allows your health care provider to monitor your heart rate and rhythm in real time.  Holter monitor. This is a portable device that records your heartbeat and can help diagnose heart arrhythmias. It allows your health care provider to track your heart activity for several days, if needed.  Stress tests by exercise or by giving medicine that makes the heart beat faster. TREATMENT  Treatment of palpitations depends on the cause of your symptoms and can vary greatly. Most cases  of palpitations do not require any treatment other than time, relaxation, and monitoring your symptoms. Other causes, such as atrial fibrillation, atrial flutter, or supraventricular tachycardia, usually require further treatment. HOME CARE INSTRUCTIONS   Avoid:  Caffeinated coffee, tea, soft drinks, diet pills, and energy drinks.  Chocolate.  Alcohol.  Stop smoking if you smoke.  Reduce your stress and anxiety. Things that can help you relax include:  A method of controlling things in your body, such as your heartbeats, with your mind (biofeedback).  Yoga.  Meditation.  Physical activity such as swimming, jogging, or walking.  Get plenty of rest and sleep. SEEK MEDICAL CARE IF:   You continue to have a fast or irregular heartbeat beyond 24 hours.  Your palpitations occur more often. SEEK IMMEDIATE MEDICAL CARE IF:  You have chest pain or shortness of breath.  You have a severe headache.  You feel dizzy or you faint. MAKE SURE YOU:  Understand these instructions.  Will watch your condition.  Will get help right away if you are not doing well or get worse. Document Released: 10/26/2000 Document Revised: 11/03/2013 Document Reviewed: 12/28/2011 [facility] Patient Information 2015 [place], [place]. This information is not intended to replace advice given to you by your health care provider. Make sure you discuss any questions you have with your  health care provider. ° ° °

## 2014-09-08 NOTE — ED Provider Notes (Signed)
CSN: [id number]     Arrival date & time 09/08/14  1118 History   First MD Initiated Contact with Patient 09/08/14 1151     Chief Complaint  Patient presents with  . Tachycardia   (Consider location/radiation/quality/duration/timing/severity/associated sxs/prior Treatment) HPI Comments: 58 year old female presents with complaint of dyspnea, racing heart, palpitations, dizziness and weakness for the past 2 weeks. The symptoms have been progressing and worsening in the past couple of days. This morning at 9 AM she developed a more serious shortness of breath associated with palpitations and a feeling of racing heart rate, dizziness and weakness. She states last week during one of these episodes that she felt pressure over her chest as though someone was standing on her chest and nausea. Denies known history of cardiac disease. She does have hepatitis C with history of cirrhosis and liver failure and alcohol abuse.    Past Medical History  Diagnosis Date  . Hepatitis C   . Shingles     left side  . Hepatitis C virus infection    Past Surgical History  Procedure Laterality Date  . Appendectomy     Family History  Problem Relation Age of Onset  . Cancer Neg Hx    History  Substance Use Topics  . Smoking status: Current Some Day Smoker  . Smokeless tobacco: Not on file  . Alcohol Use: Yes     Comment: stopped on saturday   OB History   Grav Para Term Preterm Abortions TAB SAB Ect Mult Living                 Review of Systems  Constitutional: Positive for activity change and fatigue. Negative for fever.  HENT: Negative.   Respiratory: Positive for shortness of breath. Negative for cough and wheezing.   Cardiovascular: Positive for chest pain and palpitations. Negative for leg swelling.  Gastrointestinal: Negative.   Genitourinary: Negative.   Musculoskeletal: Negative.   Skin: Negative.   Neurological: Positive for dizziness and light-headedness. Negative for seizures,  syncope, speech difficulty and numbness.    Allergies  Review of patient's allergies indicates no known allergies.  Home Medications   Prior to Admission medications   Medication Sig Start Date End Date Taking? Authorizing Provider  ALPRAZolam Duanne Moron) 0.5 MG tablet Take 1 tablet (0.5 mg total) by mouth 3 (three) times daily as needed for anxiety. 09/07/14   [name], MD  cyclobenzaprine (FLEXERIL) 10 MG tablet Take 1 tablet (10 mg total) by mouth 3 (three) times daily as needed for muscle spasms. 06/15/14   [name], MD  feeding supplement (ENSURE COMPLETE) LIQD Take 237 mLs by mouth 2 (two) times daily between meals. 05/13/13   Thurnell Lose, MD  folic acid (FOLVITE) 1 MG tablet Take 1 tablet (1 mg total) by mouth daily. 05/13/13   Thurnell Lose, MD  gabapentin (NEURONTIN) 600 MG tablet Take 1 tablet (600 mg total) by mouth 2 (two) times daily. 12/09/13   [name], MD  omeprazole (PRILOSEC) 20 MG capsule Take 1 capsule (20 mg total) by mouth daily. 06/15/14   [name], MD  ondansetron (ZOFRAN) 4 MG tablet Take 1 tablet (4 mg total) by mouth every 6 (six) hours as needed for nausea. 05/13/13   Thurnell Lose, MD  oxyCODONE (OXY IR/ROXICODONE) 5 MG immediate release tablet Take 1 tablet (5 mg total) by mouth every 8 (eight) hours as needed. 05/13/13   Thurnell Lose, MD  thiamine 100 MG tablet Take 1 tablet (  100 mg total) by mouth daily. 05/13/13   Thurnell Lose, MD  valACYclovir (VALTREX) 1000 MG tablet Take 1 tablet (1,000 mg total) by mouth 3 (three) times daily. 05/13/13   Thurnell Lose, MD   BP 113/74  Pulse 101  Temp(Src) 98.5 F (36.9 C) (Oral)  Resp 18  SpO2 100% Physical Exam  Nursing note and vitals reviewed. Constitutional: She is oriented to person, place, and time. She appears well-developed. No distress.  HENT:  Mouth/Throat: Oropharynx is clear and moist. No oropharyngeal exudate.  Eyes: Conjunctivae and EOM are normal.  Neck: Normal range of motion.  Neck supple.  Cardiovascular: Normal rate and regular rhythm.   Murmur heard. Grade 2 over 3 systolic ejection murmur with S3 gallop  Pulmonary/Chest: Effort normal and breath sounds normal. No respiratory distress. She has no wheezes.  Musculoskeletal: She exhibits no edema and no tenderness.  Lymphadenopathy:    She has no cervical adenopathy.  Neurological: She is alert and oriented to person, place, and time. She exhibits normal muscle tone.  Skin: Skin is warm and dry. No rash noted. She is not diaphoretic.  Psychiatric: She has a normal mood and affect.    ED Course  Procedures (including critical care time) Labs Review Labs Reviewed - No data to display  Imaging Review No results found. EKG; NSR. Rate 92. No ectopy. Prolonged Q-T seg. a MDM   1. Heart palpitations   2. Tachycardia   3. Dyspnea   4. Chest pain, unspecified chest pain type    Transfer to [facility] ED via EMS for evaluation of episodic palpitations, dyspnea and one episode of chest heaviness in a 43 y o smoker.    Janne Napoleon, NP 09/08/14 1215

## 2014-09-08 NOTE — ED Notes (Signed)
Pt reports SOB, lightheadedness, heart racing and feels a "kick" in her chest when she walks around.

## 2014-09-08 NOTE — ED Provider Notes (Signed)
CSN: [id number]     Arrival date & time 09/08/14  1319 History   First MD Initiated Contact with Patient 09/08/14 1349     Chief Complaint  Patient presents with  . Dizziness     (Consider location/radiation/quality/duration/timing/severity/associated sxs/prior Treatment) HPI 58 year old female presents with palpations and dyspnea over past 2 weeks. These occur daily, usually for most of the day. At onset she had an episode of chest pain, none since then. Denies pleuritic chest pain or dyspnea. Has had bilateral leg cramping recently. No nausea. Feels lightheaded when the heart rate is high. Hx of hep C. No room-spinning sensation. Seen at urgent care and sent here. Has had a nonproductive cough over past 5 days.  Past Medical History  Diagnosis Date  . Hepatitis C   . Shingles     left side  . Hepatitis C virus infection    Past Surgical History  Procedure Laterality Date  . Appendectomy     Family History  Problem Relation Age of Onset  . Cancer Neg Hx    History  Substance Use Topics  . Smoking status: Current Some Day Smoker  . Smokeless tobacco: Not on file  . Alcohol Use: Yes     Comment: stopped on saturday   OB History   Grav Para Term Preterm Abortions TAB SAB Ect Mult Living                 Review of Systems  Constitutional: Positive for fatigue. Negative for fever.  Respiratory: Positive for shortness of breath.   Cardiovascular: Positive for palpitations. Negative for chest pain.  Gastrointestinal: Negative for nausea, vomiting and abdominal pain.  Neurological: Positive for light-headedness.  All other systems reviewed and are negative.     Allergies  Review of patient's allergies indicates no known allergies.  Home Medications   Prior to Admission medications   Medication Sig Start Date End Date Taking? Authorizing Provider  ALPRAZolam Duanne Moron) 0.5 MG tablet Take 1 tablet (0.5 mg total) by mouth 3 (three) times daily as needed for anxiety.  09/07/14   [name], MD  cyclobenzaprine (FLEXERIL) 10 MG tablet Take 1 tablet (10 mg total) by mouth 3 (three) times daily as needed for muscle spasms. 06/15/14   [name], MD  feeding supplement (ENSURE COMPLETE) LIQD Take 237 mLs by mouth 2 (two) times daily between meals. 05/13/13   Thurnell Lose, MD  folic acid (FOLVITE) 1 MG tablet Take 1 tablet (1 mg total) by mouth daily. 05/13/13   Thurnell Lose, MD  gabapentin (NEURONTIN) 600 MG tablet Take 1 tablet (600 mg total) by mouth 2 (two) times daily. 12/09/13   [name], MD  omeprazole (PRILOSEC) 20 MG capsule Take 1 capsule (20 mg total) by mouth daily. 06/15/14   [name], MD  ondansetron (ZOFRAN) 4 MG tablet Take 1 tablet (4 mg total) by mouth every 6 (six) hours as needed for nausea. 05/13/13   Thurnell Lose, MD  oxyCODONE (OXY IR/ROXICODONE) 5 MG immediate release tablet Take 1 tablet (5 mg total) by mouth every 8 (eight) hours as needed. 05/13/13   Thurnell Lose, MD  thiamine 100 MG tablet Take 1 tablet (100 mg total) by mouth daily. 05/13/13   Thurnell Lose, MD  valACYclovir (VALTREX) 1000 MG tablet Take 1 tablet (1,000 mg total) by mouth 3 (three) times daily. 05/13/13   Thurnell Lose, MD   BP 134/81  Pulse 88  Temp(Src) 98.8 F (  37.1 C) (Oral)  Resp 20  Ht 5\' 6"  (1.676 m)  Wt 105 lb (47.628 kg)  BMI 16.96 kg/m2  SpO2 100% Physical Exam  Nursing note and vitals reviewed. Constitutional: She is oriented to person, place, and time. She appears well-developed and well-nourished. No distress.  HENT:  Head: Normocephalic and atraumatic.  Right Ear: External ear normal.  Left Ear: External ear normal.  Nose: Nose normal.  Eyes: Right eye exhibits no discharge. Left eye exhibits no discharge.  Cardiovascular: Normal rate, regular rhythm and normal heart sounds.   No murmur heard. Pulmonary/Chest: Effort normal and breath sounds normal. She has no wheezes. She has no rales.  Abdominal: Soft. She exhibits no  distension. There is no tenderness.  Musculoskeletal: She exhibits no edema and no tenderness.  Neurological: She is alert and oriented to person, place, and time.  Skin: Skin is warm and dry. She is not diaphoretic.    ED Course  Procedures (including critical care time) Labs Review Labs Reviewed  CBC WITH DIFFERENTIAL - Abnormal; Notable for the following:    Hemoglobin 16.1 (*)    HCT 47.1 (*)    MCV 101.3 (*)    MCH 34.6 (*)    Platelets 143 (*)    All other components within normal limits  COMPREHENSIVE METABOLIC PANEL - Abnormal; Notable for the following:    Sodium 135 (*)    Potassium 3.6 (*)    Creatinine, Ser 1.24 (*)    AST 63 (*)    ALT 63 (*)    Total Bilirubin 1.7 (*)    GFR calc non Af Amer 46 (*)    GFR calc Af Amer 54 (*)    All other components within normal limits  TROPONIN I  D-DIMER, QUANTITATIVE    Imaging Review Dg Chest 2 View  09/08/2014   CLINICAL DATA:  Shortness of breath.  Palpitations.  Cough.  EXAM: CHEST  2 VIEW  COMPARISON:  04/03/2011.  FINDINGS: Mediastinum and hilar structures are normal. Mild infiltrate right upper lobe consistent pneumonia noted. No pleural effusion or pneumothorax. Heart size normal. Thoracic spine scoliosis present. No acute bony abnormality.  IMPRESSION: Mild right upper lobe infiltrates consistent with pneumonia. Follow-up chest x-rays to demonstrate clearing suggested.   Electronically Signed   By: Marcello Moores  Register   On: 09/08/2014 14:48     EKG Interpretation   Date/Time:  Wednesday September 08 2014 13:19:30 EDT Ventricular Rate:  87 PR Interval:  144 QRS Duration: 78 QT Interval:  416 QTC Calculation: 500 R Axis:   85 Text Interpretation:  Normal sinus rhythm Right atrial enlargement Minimal  voltage criteria for LVH, may be normal variant Prolonged QT Abnormal ECG  No old tracing to compare Confirmed by [place] ([id number]) on  09/08/2014 1:22:14 PM      MDM   Final diagnoses:  Palpitations   Community acquired pneumonia    Patient is currently asymptomatic. She does have a cough and dyspnea though no hypoxia. X-ray is consistent with right upper lobe pneumonia. She has no elevated white blood cell count, fever, or increased work of breathing. From her pneumonia standpoint she is stable to be discharged. She's not any current chest pain started off the shortness breath is from pneumonia or palpitations. I discussed possible admission but patient refuses saying she needs to go home. I think is reasonable she is well-appearing and the symptoms were going on for couple weeks. I have called cardiology and we'll arrange for  outpatient Holter monitoring for her palpitations. Her heart rate is normal sinus rhythm here and I feel she is stable for outpatient management. I discussed possible second troponin although her symptoms been going on for couple weeks. Patient declines this as well states she's to go pick up her medicines at the pharmacy.    Ephraim Hamburger, MD 09/08/14 [phone]

## 2014-09-08 NOTE — ED Notes (Addendum)
Pt   Reports       Symptoms  Of   Rapid   Heartbeat               Lightheaded     And  Dizzy   Symptoms  X   2   Weeks  Got   Worse  Over   Last        Week  Worse  Today   Gets  Short   Of  Breath  On  Exertion

## 2014-09-09 ENCOUNTER — Encounter: Payer: Self-pay | Admitting: Internal Medicine

## 2014-09-09 NOTE — ED Provider Notes (Signed)
Medical screening examination/treatment/procedure(s) were performed by non-physician practitioner and as supervising physician I was immediately available for consultation/collaboration.  Philipp Deputy, M.D.  Harden Mo, MD 09/09/14 1800

## 2014-09-09 NOTE — Discharge Planning (Signed)
[place] with patient regarding her orange card and primary care provider at the [place] [facility]. Patient states she has not been able to get an appointment with her pcp. Informed patient that I would assist her with getting an appointment once ruled out if patient would be admitted to the floor. New orange card application and my contact information provided for any future questions or concerns.  09/09/14 11:00am Telephone Encounter Spoke with patient regarding the appointment that was scheduled on her behalf. Follow up appointment for  Wednesday Nov 4,2015 at 12:30pm, pt verbalized understanding of this appointment. No other [facility] Specialist needs identified at this time.

## 2014-09-09 NOTE — Progress Notes (Signed)
This encounter was created in error - please disregard.

## 2014-09-09 NOTE — Progress Notes (Deleted)
New Patient Evaluation   Date:  09/09/2014   ID:  Kara Bailey, DOB 25-Jan-1956, MRN [id number]  PCP:  [name], MD  [facility] Cardiologist:  New to Dr. Cristopher Peru today    Chief Complaint  Patient presents with  . Follow-up    ED yesterday  . Palpitations     History of Present Illness: Kara Bailey is a 58 y.o. female with a hx of ETOH abuse, HCV infection, depression/PTSD.  Patient was seen in the ED yesterday with chest pain, dyspnea, dizziness and rapid palpitations of 2 weeks duration.  CXR demonstrated RUL pneumonia.  Patient refused admission to the hospital.  She was placed on Levaquin.  FU with cardiology was requested.  Holter to be arranged.  ***   Recent Labs/Images:  Recent Labs  09/08/14 1404  NA 135*  K 3.6*  BUN 20  CREATININE 1.24*  ALT 63*  HGB 16.1*  DDIMER <0.27  TROPONINI <0.30     Dg Chest 2 View   09/08/2014    IMPRESSION: Mild right upper lobe infiltrates consistent with pneumonia. Follow-up chest x-rays to demonstrate clearing suggested.   Electronically Signed   By: Marcello Moores  Register   On: 09/08/2014 14:48     Wt Readings from Last 3 Encounters:  09/08/14 105 lb (47.628 kg)  06/15/14 110 lb 6.4 oz (50.077 kg)  12/09/13 110 lb (49.896 kg)     Past Medical History  Diagnosis Date  . Hepatitis C   . Shingles     left side  . Hepatitis C virus infection   . Palpitations     Current Outpatient Prescriptions  Medication Sig Dispense Refill  . ALPRAZolam (XANAX) 0.5 MG tablet Take 1 tablet (0.5 mg total) by mouth 3 (three) times daily as needed for anxiety.  30 tablet  0  . hydroxypropyl methylcellulose / hypromellose (ISOPTO TEARS / GONIOVISC) 2.5 % ophthalmic solution Place 1 drop into both eyes 4 (four) times daily as needed for dry eyes.      Marland Kitchen levofloxacin (LEVAQUIN) 500 MG tablet Take 1 tablet (500 mg total) by mouth daily.  7 tablet  0   No current facility-administered medications for this visit.      Allergies:   Review of patient's allergies indicates no known allergies.   Social History:  The patient  reports that she has been smoking.  She does not have any smokeless tobacco history on file. She reports that she drinks alcohol. She reports that she does not use illicit drugs.   Family History:  The patient's family history is negative for Cancer.   ROS:  Please see the history of present illness.   ***   All other systems reviewed and negative.    PHYSICAL EXAM: VS:  There were no vitals taken for this visit. Well nourished, well developed, in no acute distress HEENT: normal Neck: ***no JVD Cardiac:  normal S1, S2; ***RRR; no murmur Lungs:  ***clear to auscultation bilaterally, no wheezing, rhonchi or rales Abd: soft, nontender, no hepatomegaly Ext: ***no edema Skin: warm and dry Neuro:  CNs 2-12 intact, no focal abnormalities noted  EKG:  09/08/14 (ED) - NSR HR 87, normal axis, LVH, prolonged QT (QTc 500 ms)   ASSESSMENT AND PLAN:  Palpitations  Community acquired pneumonia  Hepatitis C virus infection, unspecified chronicity   Disposition:   FU with ***   Signed, Richardson Dopp, PA-C, MHS 09/09/2014 9:35 AM    [facility]  [address] [place], [place]  [address] Phone: [phone]; Fax: [phone]

## 2014-09-15 ENCOUNTER — Ambulatory Visit: Payer: Self-pay | Attending: Internal Medicine | Admitting: Internal Medicine

## 2014-09-15 ENCOUNTER — Encounter: Payer: Self-pay | Admitting: Internal Medicine

## 2014-09-15 VITALS — BP 144/84 | HR 86 | Temp 98.0°F | Resp 16 | Wt 112.2 lb

## 2014-09-15 DIAGNOSIS — R030 Elevated blood-pressure reading, without diagnosis of hypertension: Secondary | ICD-10-CM

## 2014-09-15 DIAGNOSIS — E876 Hypokalemia: Secondary | ICD-10-CM

## 2014-09-15 DIAGNOSIS — F419 Anxiety disorder, unspecified: Secondary | ICD-10-CM | POA: Insufficient documentation

## 2014-09-15 DIAGNOSIS — R002 Palpitations: Secondary | ICD-10-CM

## 2014-09-15 DIAGNOSIS — F17200 Nicotine dependence, unspecified, uncomplicated: Secondary | ICD-10-CM | POA: Insufficient documentation

## 2014-09-15 DIAGNOSIS — Z720 Tobacco use: Secondary | ICD-10-CM

## 2014-09-15 DIAGNOSIS — B182 Chronic viral hepatitis C: Secondary | ICD-10-CM

## 2014-09-15 DIAGNOSIS — F411 Generalized anxiety disorder: Secondary | ICD-10-CM

## 2014-09-15 DIAGNOSIS — IMO0001 Reserved for inherently not codable concepts without codable children: Secondary | ICD-10-CM

## 2014-09-15 DIAGNOSIS — J189 Pneumonia, unspecified organism: Secondary | ICD-10-CM

## 2014-09-15 LAB — COMPLETE METABOLIC PANEL WITH GFR
ALBUMIN: 3.7 g/dL (ref 3.5–5.2)
ALT: 60 U/L — ABNORMAL HIGH (ref 0–35)
AST: 53 U/L — ABNORMAL HIGH (ref 0–37)
Alkaline Phosphatase: 93 U/L (ref 39–117)
BUN: 7 mg/dL (ref 6–23)
CALCIUM: 9.5 mg/dL (ref 8.4–10.5)
CHLORIDE: 102 meq/L (ref 96–112)
CO2: 26 mEq/L (ref 19–32)
Creat: 0.81 mg/dL (ref 0.50–1.10)
GFR, EST NON AFRICAN AMERICAN: 79 mL/min
GLUCOSE: 81 mg/dL (ref 70–99)
POTASSIUM: 4.9 meq/L (ref 3.5–5.3)
SODIUM: 137 meq/L (ref 135–145)
TOTAL PROTEIN: 6.7 g/dL (ref 6.0–8.3)
Total Bilirubin: 0.9 mg/dL (ref 0.2–1.2)

## 2014-09-15 LAB — TSH: TSH: 0.796 u[IU]/mL (ref 0.350–4.500)

## 2014-09-15 NOTE — Progress Notes (Signed)
MRN: [id number] Name: Kara Bailey  Sex: female Age: 58 y.o. DOB: [birth year]  Allergies: Review of patient's allergies indicates no known allergies.  Chief Complaint  Patient presents with  . Follow-up    HPI: Patient is 58 y.o. female who has history of hepatitis C, recently went to the emergency room with symptoms of chest and shortness of breath and palpitation, EMR reviewed patient had a chest x-ray done was diagnosed with pneumonia has been started on antibiotic, as per patient she still has 2-3 more days left , denies any fever chills, also history of anxiety she takes Xanax when necessary, she was diagnosed with hepatitis C last year as per patient she has not seen any specialist and never been treated.  Past Medical History  Diagnosis Date  . Hepatitis C   . Shingles     left side  . Hepatitis C virus infection   . Palpitations     Past Surgical History  Procedure Laterality Date  . Appendectomy        Medication List       This list is accurate as of: 09/15/14 12:46 PM.  Always use your most recent med list.               ALPRAZolam 0.5 MG tablet  Commonly known as:  XANAX  Take 1 tablet (0.5 mg total) by mouth 3 (three) times daily as needed for anxiety.     hydroxypropyl methylcellulose / hypromellose 2.5 % ophthalmic solution  Commonly known as:  ISOPTO TEARS / GONIOVISC  Place 1 drop into both eyes 4 (four) times daily as needed for dry eyes.     levofloxacin 500 MG tablet  Commonly known as:  LEVAQUIN  Take 1 tablet (500 mg total) by mouth daily.        No orders of the defined types were placed in this encounter.     There is no immunization history on file for this patient.  Family History  Problem Relation Age of Onset  . Cancer Neg Hx     History  Substance Use Topics  . Smoking status: Current Some Day Smoker  . Smokeless tobacco: Not on file  . Alcohol Use: Yes     Comment: stopped on saturday    Review of  Systems   As noted in HPI  Filed Vitals:   09/15/14 1207  BP: 144/84  Pulse: 86  Temp: 98 F (36.7 C)  Resp: 16    Physical Exam  Physical Exam  Constitutional: No distress.  Eyes: Pupils are equal, round, and reactive to light.  Cardiovascular: Normal rate.   Pulmonary/Chest: Breath sounds normal. No respiratory distress. She has no wheezes. She has no rales.  Musculoskeletal: She exhibits no edema.    CBC    Component Value Date/Time   WBC 7.3 09/08/2014 1404   RBC 4.65 09/08/2014 1404   HGB 16.1* 09/08/2014 1404   HCT 47.1* 09/08/2014 1404   PLT 143* 09/08/2014 1404   MCV 101.3* 09/08/2014 1404   LYMPHSABS 1.8 09/08/2014 1404   MONOABS 0.8 09/08/2014 1404   EOSABS 0.0 09/08/2014 1404   BASOSABS 0.0 09/08/2014 1404    CMP     Component Value Date/Time   NA 135* 09/08/2014 1404   K 3.6* 09/08/2014 1404   CL 96 09/08/2014 1404   CO2 25 09/08/2014 1404   GLUCOSE 87 09/08/2014 1404   BUN 20 09/08/2014 1404   CREATININE 1.24* 09/08/2014 1404  CREATININE 0.81 05/20/2013 1330   CALCIUM 9.1 09/08/2014 1404   PROT 7.2 09/08/2014 1404   ALBUMIN 3.8 09/08/2014 1404   AST 63* 09/08/2014 1404   ALT 63* 09/08/2014 1404   ALKPHOS 104 09/08/2014 1404   BILITOT 1.7* 09/08/2014 1404   GFRNONAA 46* 09/08/2014 1404   GFRNONAA 80 05/20/2013 1330   GFRAA 54* 09/08/2014 1404   GFRAA >89 05/20/2013 1330    No results found for: CHOL  No components found for: HGA1C  Lab Results  Component Value Date/Time   AST 63* 09/08/2014 02:04 PM    Assessment and Plan  CAP (community acquired pneumonia)  Palpitations - Plan: will check her TSH level  Smoking Advised patient to quit smoking.  Chronic hepatitis C without hepatic coma - Plan: Ambulatory referral to Infectious Disease  Hypokalemia - Plan: will check blood chemistry COMPLETE METABOLIC PANEL WITH GFR  Elevated BP Advised patient for DASH diet  Anxiety state Patient takes Xanax when  necessary.    Return in about 3 months (around 12/16/2014).  [name], MD

## 2014-09-15 NOTE — Progress Notes (Signed)
Patient states here for follow up Recently diagnosed with pneumonia Currently on ABT

## 2014-09-15 NOTE — Patient Instructions (Signed)
DASH Eating Plan °DASH stands for "Dietary Approaches to Stop Hypertension." The DASH eating plan is a healthy eating plan that has been shown to reduce high blood pressure (hypertension). Additional health benefits may include reducing the risk of type 2 diabetes mellitus, heart disease, and stroke. The DASH eating plan may also help with weight loss. °WHAT DO I NEED TO KNOW ABOUT THE DASH EATING PLAN? °For the DASH eating plan, you will follow these general guidelines: °· Choose foods with a percent daily value for sodium of less than 5% (as listed on the food label). °· Use salt-free seasonings or herbs instead of table salt or sea salt. °· Check with your health care provider or pharmacist before using salt substitutes. °· Eat lower-sodium products, often labeled as "lower sodium" or "no salt added." °· Eat fresh foods. °· Eat more vegetables, fruits, and low-fat dairy products. °· Choose whole grains. Look for the word "whole" as the first word in the ingredient list. °· Choose fish and skinless chicken or turkey more often than red meat. Limit fish, poultry, and meat to 6 oz (170 g) each day. °· Limit sweets, desserts, sugars, and sugary drinks. °· Choose heart-healthy fats. °· Limit cheese to 1 oz (28 g) per day. °· Eat more home-cooked food and less restaurant, buffet, and fast food. °· Limit fried foods. °· Cook foods using methods other than frying. °· Limit canned vegetables. If you do use them, rinse them well to decrease the sodium. °· When eating at a restaurant, ask that your food be prepared with less salt, or no salt if possible. °WHAT FOODS CAN I EAT? °Seek help from a dietitian for individual calorie needs. °Grains °Whole grain or whole wheat bread. Brown rice. Whole grain or whole wheat pasta. Quinoa, bulgur, and whole grain cereals. Low-sodium cereals. Corn or whole wheat flour tortillas. Whole grain cornbread. Whole grain crackers. Low-sodium crackers. °Vegetables °Fresh or frozen vegetables  (raw, steamed, roasted, or grilled). Low-sodium or reduced-sodium tomato and vegetable juices. Low-sodium or reduced-sodium tomato sauce and paste. Low-sodium or reduced-sodium canned vegetables.  °Fruits °All fresh, canned (in natural juice), or frozen fruits. °Meat and Other Protein Products °Ground beef (85% or leaner), grass-fed beef, or beef trimmed of fat. Skinless chicken or turkey. Ground chicken or turkey. Pork trimmed of fat. All fish and seafood. Eggs. Dried beans, peas, or lentils. Unsalted nuts and seeds. Unsalted canned beans. °Dairy °Low-fat dairy products, such as skim or 1% milk, 2% or reduced-fat cheeses, low-fat ricotta or cottage cheese, or plain low-fat yogurt. Low-sodium or reduced-sodium cheeses. °Fats and Oils °Tub margarines without trans fats. Light or reduced-fat mayonnaise and salad dressings (reduced sodium). Avocado. Safflower, olive, or canola oils. Natural peanut or almond butter. °Other °Unsalted popcorn and pretzels. °The items listed above may not be a complete list of recommended foods or beverages. Contact your dietitian for more options. °WHAT FOODS ARE NOT RECOMMENDED? °Grains °White bread. White pasta. White rice. Refined cornbread. Bagels and croissants. Crackers that contain trans fat. °Vegetables °Creamed or fried vegetables. Vegetables in a cheese sauce. Regular canned vegetables. Regular canned tomato sauce and paste. Regular tomato and vegetable juices. °Fruits °Dried fruits. Canned fruit in light or heavy syrup. Fruit juice. °Meat and Other Protein Products °Fatty cuts of meat. Ribs, chicken wings, bacon, sausage, bologna, salami, chitterlings, fatback, hot dogs, bratwurst, and packaged luncheon meats. Salted nuts and seeds. Canned beans with salt. °Dairy °Whole or 2% milk, cream, half-and-half, and cream cheese. Whole-fat or sweetened yogurt. Full-fat   cheeses or blue cheese. Nondairy creamers and whipped toppings. Processed cheese, cheese spreads, or cheese  curds. °Condiments °Onion and garlic salt, seasoned salt, table salt, and sea salt. Canned and packaged gravies. Worcestershire sauce. Tartar sauce. Barbecue sauce. Teriyaki sauce. Soy sauce, including reduced sodium. Steak sauce. Fish sauce. Oyster sauce. Cocktail sauce. Horseradish. Ketchup and mustard. Meat flavorings and tenderizers. Bouillon cubes. Hot sauce. Tabasco sauce. Marinades. Taco seasonings. Relishes. °Fats and Oils °Butter, stick margarine, lard, shortening, ghee, and bacon fat. Coconut, palm kernel, or palm oils. Regular salad dressings. °Other °Pickles and olives. Salted popcorn and pretzels. °The items listed above may not be a complete list of foods and beverages to avoid. Contact your dietitian for more information. °WHERE CAN I FIND MORE INFORMATION? °[employer], [employer], [employer]: www.nhlbi.nih.gov/health/health-topics/topics/dash/ °Document Released: 10/18/2011 Document Revised: 03/15/2014 Document Reviewed: 09/02/2013 °Exit[employer]® Patient Information ©2015 [employer]. This information is not intended to replace advice given to you by your health care provider. Make sure you discuss any questions you have with your health care provider. ° °

## 2014-09-27 ENCOUNTER — Other Ambulatory Visit: Payer: Self-pay

## 2014-09-27 DIAGNOSIS — B182 Chronic viral hepatitis C: Secondary | ICD-10-CM

## 2014-09-28 LAB — IRON: Iron: 247 ug/dL — ABNORMAL HIGH (ref 42–145)

## 2014-09-28 LAB — HEPATITIS B SURFACE ANTIGEN: Hepatitis B Surface Ag: NEGATIVE

## 2014-09-28 LAB — HEPATITIS A ANTIBODY, TOTAL: Hep A Total Ab: NONREACTIVE

## 2014-09-28 LAB — HCV RNA QUANT RFLX ULTRA OR GENOTYP
HCV Quantitative Log: 5.57 {Log} — ABNORMAL HIGH (ref <1.18)
HCV Quantitative: 369398 IU/mL — ABNORMAL HIGH (ref <15)

## 2014-09-28 LAB — HEPATITIS B CORE ANTIBODY, TOTAL: Hep B Core Total Ab: NONREACTIVE

## 2014-09-28 LAB — HIV ANTIBODY (ROUTINE TESTING W REFLEX): HIV: NONREACTIVE

## 2014-09-28 LAB — PROTIME-INR
INR: 1.03 (ref <1.50)
Prothrombin Time: 13.5 seconds (ref 11.6–15.2)

## 2014-09-28 LAB — ANA: Anti Nuclear Antibody(ANA): NEGATIVE

## 2014-09-28 LAB — HEPATITIS B SURFACE ANTIBODY,QUALITATIVE: HEP B S AB: NEGATIVE

## 2014-09-30 ENCOUNTER — Encounter: Payer: Self-pay | Admitting: Radiology

## 2014-09-30 ENCOUNTER — Encounter (INDEPENDENT_AMBULATORY_CARE_PROVIDER_SITE_OTHER): Payer: Self-pay

## 2014-09-30 DIAGNOSIS — R002 Palpitations: Secondary | ICD-10-CM

## 2014-09-30 LAB — HEPATITIS C GENOTYPE

## 2014-09-30 NOTE — Progress Notes (Signed)
Evo 24hr holter applied

## 2014-10-04 ENCOUNTER — Telehealth: Payer: Self-pay | Admitting: Internal Medicine

## 2014-10-04 ENCOUNTER — Other Ambulatory Visit: Payer: Self-pay

## 2014-10-04 ENCOUNTER — Telehealth: Payer: Self-pay

## 2014-10-04 NOTE — Telephone Encounter (Signed)
Returned patient phone call Patient not available Left message on voice mail to return our call 

## 2014-10-04 NOTE — Telephone Encounter (Signed)
Patient calling for med refill for ALPRAZolam Kara Bailey) 0.5 MG tablet please assist

## 2014-10-06 NOTE — Telephone Encounter (Signed)
Expand All Collapse All   Patient calling for med refill for ALPRAZolam (XANAX) 0.5 MG tablet please assist

## 2014-10-11 ENCOUNTER — Other Ambulatory Visit: Payer: Self-pay

## 2014-10-11 ENCOUNTER — Telehealth: Payer: Self-pay

## 2014-10-11 ENCOUNTER — Telehealth: Payer: Self-pay | Admitting: Cardiology

## 2014-10-11 ENCOUNTER — Encounter: Payer: Self-pay | Admitting: Cardiology

## 2014-10-11 DIAGNOSIS — I493 Ventricular premature depolarization: Secondary | ICD-10-CM | POA: Insufficient documentation

## 2014-10-11 DIAGNOSIS — I491 Atrial premature depolarization: Secondary | ICD-10-CM

## 2014-10-11 DIAGNOSIS — F411 Generalized anxiety disorder: Secondary | ICD-10-CM

## 2014-10-11 HISTORY — DX: Ventricular premature depolarization: I49.3

## 2014-10-11 HISTORY — DX: Atrial premature depolarization: I49.1

## 2014-10-11 MED ORDER — ALPRAZOLAM 0.5 MG PO TABS: 0.5000 mg | ORAL_TABLET | Freq: Three times a day (TID) | ORAL | Status: DC | PRN

## 2014-10-11 NOTE — Telephone Encounter (Signed)
Please forward results of heart monitor to patient's PCP

## 2014-10-11 NOTE — Telephone Encounter (Signed)
Patient calling requesting refill on her xanax Prescription printed and will be at the front desk

## 2014-10-11 NOTE — Telephone Encounter (Signed)
Please let patient know that heart monitor showed NSR with average HR 92bpm with occasional extra heart beats from the top and bottom of the heart which are benign

## 2014-10-12 ENCOUNTER — Telehealth: Payer: Self-pay | Admitting: Cardiology

## 2014-10-12 NOTE — Telephone Encounter (Signed)
Forwarded results of heart monitor to PCP via EPIC.

## 2014-10-12 NOTE — Telephone Encounter (Signed)
Informed patient of monitor results and verbal understanding expressed.   

## 2014-10-12 NOTE — Telephone Encounter (Signed)
New Msg    Patient returning call please contact at [phone].

## 2014-10-12 NOTE — Telephone Encounter (Signed)
Left message to call back  

## 2014-10-14 ENCOUNTER — Ambulatory Visit (INDEPENDENT_AMBULATORY_CARE_PROVIDER_SITE_OTHER): Payer: Self-pay | Admitting: Cardiology

## 2014-10-14 ENCOUNTER — Encounter: Payer: Self-pay | Admitting: Cardiology

## 2014-10-14 VITALS — BP 154/82 | HR 75 | Ht 66.0 in | Wt 114.0 lb

## 2014-10-14 DIAGNOSIS — R0602 Shortness of breath: Secondary | ICD-10-CM | POA: Insufficient documentation

## 2014-10-14 DIAGNOSIS — Z720 Tobacco use: Secondary | ICD-10-CM

## 2014-10-14 DIAGNOSIS — I493 Ventricular premature depolarization: Secondary | ICD-10-CM

## 2014-10-14 DIAGNOSIS — I491 Atrial premature depolarization: Secondary | ICD-10-CM

## 2014-10-14 DIAGNOSIS — F17200 Nicotine dependence, unspecified, uncomplicated: Secondary | ICD-10-CM

## 2014-10-14 DIAGNOSIS — R079 Chest pain, unspecified: Secondary | ICD-10-CM

## 2014-10-14 MED ORDER — METOPROLOL SUCCINATE ER 25 MG PO TB24: 25.0000 mg | ORAL_TABLET | Freq: Every day | ORAL | Status: DC

## 2014-10-14 NOTE — Progress Notes (Signed)
[place], [place]  [address] Phone: [phone] Fax:  [phone]  Date:  10/14/2014   ID:  Kara Bailey, DOB 1956-03-05, MRN [id number]  PCP:  [name], MD  Cardiologist:  Fransico Him, MD    History of Present Illness: Kara Bailey is a 58 y.o. female with a history of PAC's and PVC's, Hep C, chest pain and SOB.  She was seen in [facility] with a diagnosis of chest pain and SOB by ER report as well as palpitations and chest xray showed PNA and she was treated with antibiotics. She states that she did not have CP at that time. She has had some episodes of chest pain that are very infrequent and last off and on for about 30 minutes and resolves.  She describes it as a pressure with no radiation and associated diaphoresis or nausea.  The pressure only occurs with her palpitations.  She has problems with SOB for the past few months.  The SOB occurs at any time.  Her main complaint is the palpitations.  She recently wore a heart monitor that showed PVC's and PAC's.    Wt Readings from Last 3 Encounters:  10/14/14 114 lb (51.71 kg)  09/15/14 112 lb 3.2 oz (50.894 kg)  09/08/14 105 lb (47.628 kg)     Past Medical History  Diagnosis Date  . Hepatitis C   . Shingles     left side  . Hepatitis C virus infection   . Palpitations   . PAC (premature atrial contraction) 10/11/2014  . PVC (premature ventricular contraction) 10/11/2014  . Dyspnea 08/2014  . Chest pain 08/2014    unspecified  . Tachycardia 08/2014  . Dizziness 08/2014  . Weakness 08/2014    Current Outpatient Prescriptions  Medication Sig Dispense Refill  . ALPRAZolam (XANAX) 0.5 MG tablet Take 1 tablet (0.5 mg total) by mouth 3 (three) times daily as needed for anxiety. 30 tablet 0  . levofloxacin (LEVAQUIN) 500 MG tablet Take 1 tablet (500 mg total) by mouth daily. 7 tablet 0   No current facility-administered medications for this visit.    Allergies:   No Known Allergies  Social  History:  The patient  reports that she has been smoking.  She does not have any smokeless tobacco history on file. She reports that she drinks alcohol. She reports that she does not use illicit drugs.   Family History:  The patient's family history includes Alzheimer's disease in her father; CVA in her father; Heart attack in her mother; Heart disease in her mother; Heart failure in her mother; Hyperlipidemia in her mother; Hypertension in her mother. There is no history of Cancer.   ROS:  Please see the history of present illness.      All other systems reviewed and negative.   PHYSICAL EXAM: VS:  BP 154/82 mmHg  Pulse 75  Ht 5\' 6"  (1.676 m)  Wt 114 lb (51.71 kg)  BMI 18.41 kg/m2 Well nourished, well developed, in no acute distress HEENT: normal Neck: no JVD Cardiac:  normal S1, S2; RRR; no murmur Lungs:  clear to auscultation bilaterally, no wheezing, rhonchi or rales Abd: soft, nontender, no hepatomegaly Ext: no edema Skin: warm and dry Neuro:  CNs 2-12 intact, no focal abnormalities noted   ASSESSMENT AND PLAN:  1. Palpitations with PVC's and PAC's on heart monitor 2. Chest pain and SOB in the setting of PNA.  She was treated with antibiotics but still has  intermittent chest pressure that only comes with the palpitations.  The SOB has continued despite treatment of her PNA.  Her CRF include ongoing tobacco use and family history of CAD.  I will get a Stress myoview to rule out ischemia and 2 D echo to assess LVF. 3. PVC's and PAC's - she is symptomatic with these and her BP is borderline elevated so I will start her on Toprol XL 25mg  daily 4.  Tobacco abuse 5.   Elevated BP - start Toprol  Followup with me in 4 weeks  Signed, Fransico Him, MD [facility] 10/14/2014 4:13 PM

## 2014-10-14 NOTE — Patient Instructions (Addendum)
Your physician has recommended you make the following change in your medication:  1) START Toprol XL 25 mg daily  Dr. Radford Pax recommends you have a STRESS MYOVIEW.  Your physician has requested that you have an echocardiogram. Echocardiography is a painless test that uses sound waves to create images of your heart. It provides your doctor with information about the size and shape of your heart and how well your heart's chambers and valves are working. This procedure takes approximately one hour. There are no restrictions for this procedure.  Your physician recommends that you schedule a follow-up appointment in: 4 weeks with Dr. Radford Pax.

## 2014-10-19 ENCOUNTER — Ambulatory Visit (HOSPITAL_BASED_OUTPATIENT_CLINIC_OR_DEPARTMENT_OTHER): Payer: Self-pay | Admitting: Radiology

## 2014-10-19 ENCOUNTER — Ambulatory Visit (HOSPITAL_COMMUNITY): Payer: Self-pay | Attending: Cardiology | Admitting: Radiology

## 2014-10-19 ENCOUNTER — Other Ambulatory Visit (HOSPITAL_COMMUNITY): Payer: Self-pay

## 2014-10-19 DIAGNOSIS — R0602 Shortness of breath: Secondary | ICD-10-CM

## 2014-10-19 DIAGNOSIS — R079 Chest pain, unspecified: Secondary | ICD-10-CM | POA: Insufficient documentation

## 2014-10-19 MED ORDER — REGADENOSON 0.4 MG/5ML IV SOLN
0.4000 mg | Freq: Once | INTRAVENOUS | Status: AC
Start: 2014-10-19 — End: 2014-10-19
  Administered 2014-10-19: 0.4 mg via INTRAVENOUS

## 2014-10-19 MED ORDER — TECHNETIUM TC 99M SESTAMIBI GENERIC - CARDIOLITE
30.0000 | Freq: Once | INTRAVENOUS | Status: AC | PRN
  Administered 2014-10-19: 30 via INTRAVENOUS

## 2014-10-19 MED ORDER — TECHNETIUM TC 99M SESTAMIBI GENERIC - CARDIOLITE
10.0000 | Freq: Once | INTRAVENOUS | Status: AC | PRN
  Administered 2014-10-19: 10 via INTRAVENOUS

## 2014-10-19 NOTE — Progress Notes (Signed)
[place] [facility] [address] [place], [place] [address] [phone]    Cardiology Nuclear Med Study  Kara Bailey is a 58 y.o. female     MRN : [id number]     DOB: May 13, 1956  Procedure Date: 10/19/2014  Nuclear Med Background Indication for Stress Test:  Evaluation for Ischemia History:  Asthma Cardiac Risk Factors: Hypertension  Symptoms:  Chest Pain, Palpitations and SOB   Nuclear Pre-Procedure Caffeine/Decaff Intake:  None> 12 hrs NPO After: 9:00pm   Lungs:  clear O2 Sat: 98% on room air. IV 0.9% NS with Angio Cath:  22g  IV Site: R Antecubital x 1, tolerated well IV Started by:  Irven Baltimore, RN  Chest Size (in):  34 Cup Size: C  Height: 5\' 6"  (1.676 m)  Weight:  108 lb (48.988 kg)  BMI:  Body mass index is 17.44 kg/(m^2). Tech Comments:  Patient held Toprol x 36 hrs. Irven Baltimore, RN.    Nuclear Med Study 1 or 2 day study: 1 day  Stress Test Type:  Treadmill/Lexiscan  Reading MD: N/A  Order Authorizing Provider:  Fransico Him, MD  Resting Radionuclide: Technetium 34m Sestamibi  Resting Radionuclide Dose: 11.0 mCi   Stress Radionuclide:  Technetium 82m Sestamibi  Stress Radionuclide Dose: 33.0 mCi           Stress Protocol Rest HR: 67 Stress HR: 111  Rest BP: 157/84 Stress BP: 171/79  Exercise Time (min): n/a METS: n/a   Predicted Max HR: 160 bpm % Max HR: 69.38 bpm Rate Pressure Product: 18981   Dose of Adenosine (mg):  n/a Dose of Lexiscan: 0.4 mg  Dose of Atropine (mg): n/a Dose of Dobutamine: n/a mcg/kg/min (at max HR)  Stress Test Technologist: Perrin Maltese, EMT-P  Nuclear Technologist:  Earl Many, CNMT     Rest Procedure:  Myocardial perfusion imaging was performed at rest 45 minutes following the intravenous administration of Technetium 19m Sestamibi. Rest ECG: Sinus rhythm, left ventricular hypertrophy, nonspecific ST-T wave changes  Stress Procedure:  The patient received IV Lexiscan 0.4 mg over 15-seconds  with concurrent low level exercise and then Technetium 110m Sestamibi was injected at 30-seconds while the patient continued walking one more minute. This patient was weak and had sob with the Lexiscan injection.  Quantitative spect images were obtained after a 45-minute delay. Stress ECG: No significant change from baseline ECG rare PVC  QPS Raw Data Images:  Normal; no motion artifact; normal heart/lung ratio. Stress Images:  There is a small defect involving the mid anteroseptal wall, mildly reversible. There is also a small defect involving the base inferolateral wall that is mildly reversible. Both of these defects may represent a mild degree of ischemia. Rest Images:  As described above, otherwise homogeneous radiotracer uptake. Subtraction (SDS):  11, I do believe that this is an over estimation Transient Ischemic Dilatation (Normal <1.22):  0.93 Lung/Heart Ratio (Normal <0.45):  0.31  Quantitative Gated Spect Images QGS EDV:  56 ml QGS ESV:  11 ml  Impression Exercise Capacity:  Lexiscan with low level exercise. BP Response:  Normal blood pressure response. Clinical Symptoms:  Shortness of breath, weak, unable to walk treadmill ECG Impression:  No significant ST segment change suggestive of ischemia. Comparison with Prior Nuclear Study: No images to compare  Overall Impression:  Intermediate risk stress nuclear study With possible mild ischemia involving the inferolateral wall as well as anteroseptal wall..  LV Ejection Fraction: 80%.  LV Wall Motion:  NL LV Function; NL Wall Motion  Candee Furbish, MD

## 2014-10-19 NOTE — Progress Notes (Signed)
Echocardiogram performed.  

## 2014-10-20 ENCOUNTER — Encounter (HOSPITAL_COMMUNITY): Payer: Self-pay | Admitting: Radiology

## 2014-10-20 ENCOUNTER — Encounter (HOSPITAL_COMMUNITY): Payer: Self-pay | Admitting: Cardiology

## 2014-10-21 ENCOUNTER — Ambulatory Visit (INDEPENDENT_AMBULATORY_CARE_PROVIDER_SITE_OTHER): Payer: Self-pay | Admitting: Internal Medicine

## 2014-10-21 ENCOUNTER — Encounter: Payer: Self-pay | Admitting: Internal Medicine

## 2014-10-21 VITALS — BP 147/83 | HR 80 | Temp 98.4°F | Ht 66.0 in | Wt 112.0 lb

## 2014-10-21 DIAGNOSIS — Z23 Encounter for immunization: Secondary | ICD-10-CM

## 2014-10-21 DIAGNOSIS — B182 Chronic viral hepatitis C: Secondary | ICD-10-CM

## 2014-10-21 NOTE — Progress Notes (Signed)
+Kara Bailey is a 58 y.o. female who presents for initial evaluation and management of a positive Hepatitis C antibody test.  Patient tested positive last year for a screening test. Hepatitis C risk factors present are: history of blood transfusion (details: Multiple transfusions prior to 1990). Patient denies acupuncture, history of clotting factor transfusion, intranasal drug use, IV drug abuse, multiple sexual partners, renal dialysis, sexual contact with person with liver disease, tattoos. Patient has had other studies performed. Results: hepatitis C RNA by PCR, result: positive. Patient has not had prior treatment for Hepatitis C. Patient does have a past history of liver disease. Patient does not have a family history of liver disease.   HPI: She has a history of alcohol abuse though has reduced her intake considerably. She does have suggestion of cirrhosis based on her labs and on ultrasound that showed possible early cirrhosis.  Patient does not have documented immunity to Hepatitis A. Patient does not have documented immunity to Hepatitis B.     Review of Systems A comprehensive review of systems was negative.   Past Medical History  Diagnosis Date  . Hepatitis C   . Shingles     left side  . Hepatitis C virus infection   . Palpitations   . PAC (premature atrial contraction) 10/11/2014  . PVC (premature ventricular contraction) 10/11/2014  . Dyspnea 08/2014  . Chest pain 08/2014    unspecified  . Tachycardia 08/2014  . Dizziness 08/2014  . Weakness 08/2014    Prior to Admission medications   Medication Sig Start Date End Date Taking? Authorizing Provider  ALPRAZolam Duanne Moron) 0.5 MG tablet Take 1 tablet (0.5 mg total) by mouth 3 (three) times daily as needed for anxiety. 10/11/14  Yes [name], MD  metoprolol succinate (TOPROL-XL) 25 MG 24 hr tablet Take 1 tablet (25 mg total) by mouth daily. Take with or immediately following a meal. 10/14/14  Yes Sueanne Margarita, MD   omeprazole (PRILOSEC) 20 MG capsule Take 20 mg by mouth daily.   Yes Historical Provider, MD    No Known Allergies  History  Substance Use Topics  . Smoking status: Current Every Day Smoker -- 0.25 packs/day    Types: Cigarettes    Start date: 11/12/1968  . Smokeless tobacco: Never Used     Comment: cutting back  . Alcohol Use: Yes     Comment: beer on weekends    Family History  Problem Relation Age of Onset  . Cancer Neg Hx   . Heart failure Mother   . Heart attack Mother   . Heart disease Mother   . Hypertension Mother   . Hyperlipidemia Mother   . CVA Father   . Alzheimer's disease Father       Objective:   Filed Vitals:   10/21/14 1548  BP: 147/83  Pulse: 80  Temp: 98.4 F (36.9 C)   in no apparent distress and alert HEENT: anicteric Cor RRR and No murmurs clear Bowel sounds are normal, liver is not enlarged, spleen is not enlarged peripheral pulses normal, no pedal edema, no clubbing or cyanosis negative for - jaundice, spider hemangioma, telangiectasia, palmar erythema, ecchymosis and atrophy  Laboratory Genotype:  Lab Results  Component Value Date   HCVGENOTYPE 1b 09/27/2014   HCV viral load:  Lab Results  Component Value Date   HCVQUANT 932355* 09/27/2014   Lab Results  Component Value Date   WBC 7.3 09/08/2014   HGB 16.1* 09/08/2014   HCT 47.1* 09/08/2014  MCV 101.3* 09/08/2014   PLT 143* 09/08/2014    Lab Results  Component Value Date   CREATININE 0.81 09/15/2014   BUN 7 09/15/2014   NA 137 09/15/2014   K 4.9 09/15/2014   CL 102 09/15/2014   CO2 26 09/15/2014    Lab Results  Component Value Date   ALT 60* 09/15/2014   AST 53* 09/15/2014   ALKPHOS 93 09/15/2014   BILITOT 0.9 09/15/2014   INR 1.03 09/27/2014      Assessment: Hepatitis C genotype 1b  Plan: 1) Patient counseled extensively on limiting acetaminophen to no more than 2 grams daily, avoidance of alcohol. 2) Transmission discussed with patient including  sexual transmission, sharing razors and toothbrush.   3) Will need referral to gastroenterology if concern for cirrhosis 4) Will need referral for substance abuse counseling: No.Though I did offer it if she is interested.   5) Will prescribe Harvoni for 12 weeks once work up complete 6) Hepatitis A vaccine Yes.   7) Hepatitis B vaccine Yes.   8) Pneumovax vaccine if concern for cirrhosis 9) will follow up after elastography

## 2014-10-22 ENCOUNTER — Ambulatory Visit (INDEPENDENT_AMBULATORY_CARE_PROVIDER_SITE_OTHER): Payer: Self-pay | Admitting: Cardiology

## 2014-10-22 ENCOUNTER — Other Ambulatory Visit: Payer: Self-pay | Admitting: Cardiology

## 2014-10-22 VITALS — BP 122/70 | HR 75 | Ht 65.0 in | Wt 112.0 lb

## 2014-10-22 DIAGNOSIS — R0602 Shortness of breath: Secondary | ICD-10-CM

## 2014-10-22 DIAGNOSIS — Z01812 Encounter for preprocedural laboratory examination: Secondary | ICD-10-CM

## 2014-10-22 LAB — BASIC METABOLIC PANEL
BUN: 9 mg/dL (ref 6–23)
CALCIUM: 9.7 mg/dL (ref 8.4–10.5)
CO2: 27 mEq/L (ref 19–32)
Chloride: 109 mEq/L (ref 96–112)
Creatinine, Ser: 0.7 mg/dL (ref 0.4–1.2)
GFR: 104.32 mL/min (ref >60.00)
Glucose, Bld: 93 mg/dL (ref 70–99)
POTASSIUM: 5.2 meq/L — AB (ref 3.5–5.1)
Sodium: 138 mEq/L (ref 135–145)

## 2014-10-22 LAB — CBC WITH DIFFERENTIAL/PLATELET
BASOS PCT: 0.3 % (ref 0.0–3.0)
Basophils Absolute: 0 10*3/uL (ref 0.0–0.1)
Eosinophils Absolute: 0.1 10*3/uL (ref 0.0–0.7)
Eosinophils Relative: 1.6 % (ref 0.0–5.0)
HCT: 45.8 % (ref 36.0–46.0)
Hemoglobin: 15.1 g/dL — ABNORMAL HIGH (ref 12.0–15.0)
LYMPHS PCT: 25.9 % (ref 12.0–46.0)
Lymphs Abs: 2.2 10*3/uL (ref 0.7–4.0)
MCHC: 33 g/dL (ref 30.0–36.0)
MCV: 104 fl — ABNORMAL HIGH (ref 78.0–100.0)
MONOS PCT: 11.9 % (ref 3.0–12.0)
Monocytes Absolute: 1 10*3/uL (ref 0.1–1.0)
NEUTROS PCT: 60.3 % (ref 43.0–77.0)
Neutro Abs: 5.1 10*3/uL (ref 1.4–7.7)
PLATELETS: 206 10*3/uL (ref 150.0–400.0)
RBC: 4.4 Mil/uL (ref 3.87–5.11)
RDW: 14.2 % (ref 11.5–15.5)
WBC: 8.5 10*3/uL (ref 4.0–10.5)

## 2014-10-22 LAB — PROTIME-INR
INR: 1 ratio (ref 0.8–1.0)
PROTHROMBIN TIME: 11.4 s (ref 9.6–13.1)

## 2014-10-22 LAB — APTT: aPTT: 28.6 s (ref 23.4–32.7)

## 2014-10-22 NOTE — Progress Notes (Signed)
[address] [place], [place]  [address] Phone: [phone] Fax:  [phone]  Date:  10/22/2014   ID:  Kara Bailey, DOB 11/17/1955, MRN [id number]  PCP:  ADVANI, DEEPAK, MD  Cardiologist:  Traci Turner, MD    History of Present Illness: Kara Bailey is a 58 y.o. female with a history of PAC's and PVC's, Hep C, chest pain and SOB. She was seen in [facility] with a diagnosis of chest pain and SOB by ER report as well as palpitations and chest xray showed PNA and she was treated with antibiotics. She states that she did not have CP at that time. She has had some episodes of chest pain that are very infrequent and last off and on for about 30 minutes and resolves. She describes it as a pressure with no radiation and associated diaphoresis or nausea. The pressure only occurs with her palpitations. She has problems with SOB for the past few months. The SOB occurs at any time. Her main complaint is the palpitations. She recently wore a heart monitor that showed PVC's and PAC's. She was started on Toprol XL 25mg daily.  2D echo showed normal LVF.  Nuclear stress test showed an intermediate risk stress nuclear study with possible mild ischemia involving the inferolateral wall as well as anteroseptal wall.  She now presents today to discuss the results of her study. She continues to have SOB but her palpitations have improved on Toprol.   Wt Readings from Last 3 Encounters:  10/22/14 112 lb (50.803 kg)  10/21/14 112 lb (50.803 kg)  10/19/14 108 lb (48.988 kg)     Past Medical History  Diagnosis Date  . Hepatitis C   . Shingles     left side  . Hepatitis C virus infection   . Palpitations   . PAC (premature atrial contraction) 10/11/2014  . PVC (premature ventricular contraction) 10/11/2014  . Dyspnea 08/2014  . Chest pain 08/2014    unspecified  . Tachycardia 08/2014  . Dizziness 08/2014  . Weakness 08/2014    Current Outpatient Prescriptions  Medication Sig  Dispense Refill  . ALPRAZolam (XANAX) 0.5 MG tablet Take 1 tablet (0.5 mg total) by mouth 3 (three) times daily as needed for anxiety. 30 tablet 0  . metoprolol succinate (TOPROL-XL) 25 MG 24 hr tablet Take 1 tablet (25 mg total) by mouth daily. Take with or immediately following a meal. 30 tablet 6  . omeprazole (PRILOSEC) 20 MG capsule Take 20 mg by mouth daily.     No current facility-administered medications for this visit.    Allergies:   No Known Allergies  Social History:  The patient  reports that she has been smoking Cigarettes.  She started smoking about 45 years ago. She has been smoking about 0.25 packs per day. She has never used smokeless tobacco. She reports that she drinks alcohol. She reports that she does not use illicit drugs.   Family History:  The patient's family history includes Alzheimer's disease in her father; CVA in her father; Heart attack in her mother; Heart disease in her mother; Heart failure in her mother; Hyperlipidemia in her mother; Hypertension in her mother. There is no history of Cancer.   ROS:  Please see the history of present illness.      All other systems reviewed and negative.   PHYSICAL EXAM: VS:  BP 122/70 mmHg  Pulse 75  Ht 5' 5" (1.651 m)  Wt 112 lb (50.803 kg)    BMI 18.64 kg/m2 Well nourished, well developed, in no acute distress HEENT: normal Neck: no JVD Cardiac:  normal S1, S2; RRR; no murmur Lungs:  clear to auscultation bilaterally, no wheezing, rhonchi or rales Abd: soft, nontender, no hepatomegaly Ext: no edema Skin: warm and dry Neuro:  CNs 2-12 intact, no focal abnormalities noted   ASSESSMENT AND PLAN:  1.  Palpitations with PVC's and PAC's on heart monitor.  Improved on Toprol 2.  Chest pain and SOB in the setting of PNA. She was treated with antibiotics but still has intermittent chest pressure that only comes with the palpitations. The SOB has continued despite treatment of her PNA. Her CRF include ongoing tobacco  use and family history of CAD.Stress myoview showed an intermediate risk study with possible mild ischemia involving the inferolateral wall as well as anteroseptal wall.  She continue to have SOB so I have recommended that we proceed with cardiac catheterization. Cardiac catheterization was discussed with the patient fully including risks on myocardial infarction, death, stroke, bleeding, arrhythmia, dye allergy, renal insufficiency or bleeding.  All patient questions and concerns were discussed and the patient understands and is willing to proceed.  I have instructed her to call [phone] if she has any CP in the interim that does not resolve with rest and go to the ER.  I have asked her to start an 81mg ASA daily 3.  PVC's and PAC's - she is symptomatic with these and her BP is borderline elevated so I will start her on Toprol XL 25mg daily 4. Tobacco abuse - I have encouraged her to try to quit 5. Elevated BP - improved on Toprol    Followup PRN after cath   Signed, Traci Turner, MD [facility] 10/22/2014 3:30 PM  

## 2014-10-22 NOTE — Patient Instructions (Signed)
Your physician has requested that you have a cardiac catheterization. Cardiac catheterization is used to diagnose and/or treat various heart conditions. Doctors may recommend this procedure for a number of different reasons. The most common reason is to evaluate chest pain. Chest pain can be a symptom of coronary artery disease (CAD), and cardiac catheterization can show whether plaque is narrowing or blocking your heart's arteries. This procedure is also used to evaluate the valves, as well as measure the blood flow and oxygen levels in different parts of your heart. For further information please visit [email or web address]. Please follow instruction sheet, as given.  Your physician recommends that you schedule a follow-up appointment after your catheterization.

## 2014-10-29 ENCOUNTER — Encounter (HOSPITAL_COMMUNITY)
Admission: RE | Disposition: A | Discharge status: Home / Self Care | Payer: Self-pay | Source: Ambulatory Visit | Attending: Cardiovascular Disease

## 2014-10-29 ENCOUNTER — Encounter (HOSPITAL_COMMUNITY): Payer: Self-pay | Admitting: Cardiovascular Disease

## 2014-10-29 ENCOUNTER — Ambulatory Visit (HOSPITAL_COMMUNITY)
Admission: RE | Admit: 2014-10-29 | Discharge: 2014-10-29 | Disposition: A | Discharge status: Home / Self Care | Payer: Self-pay | Source: Ambulatory Visit | Attending: Cardiovascular Disease | Admitting: Cardiovascular Disease

## 2014-10-29 DIAGNOSIS — R9431 Abnormal electrocardiogram [ECG] [EKG]: Secondary | ICD-10-CM | POA: Insufficient documentation

## 2014-10-29 DIAGNOSIS — R0602 Shortness of breath: Secondary | ICD-10-CM

## 2014-10-29 DIAGNOSIS — R079 Chest pain, unspecified: Secondary | ICD-10-CM | POA: Insufficient documentation

## 2014-10-29 DIAGNOSIS — B1920 Unspecified viral hepatitis C without hepatic coma: Secondary | ICD-10-CM | POA: Insufficient documentation

## 2014-10-29 DIAGNOSIS — F17210 Nicotine dependence, cigarettes, uncomplicated: Secondary | ICD-10-CM | POA: Insufficient documentation

## 2014-10-29 DIAGNOSIS — R002 Palpitations: Secondary | ICD-10-CM | POA: Insufficient documentation

## 2014-10-29 HISTORY — PX: LEFT HEART CATHETERIZATION WITH CORONARY ANGIOGRAM: SHX5451

## 2014-10-29 LAB — POTASSIUM: Potassium: 4.3 mEq/L (ref 3.7–5.3)

## 2014-10-29 SURGERY — LEFT HEART CATHETERIZATION WITH CORONARY ANGIOGRAM
Anesthesia: LOCAL

## 2014-10-29 MED ORDER — HEPARIN SODIUM (PORCINE) 1000 UNIT/ML IJ SOLN
INTRAMUSCULAR | Status: AC
Start: 2014-10-29 — End: 2014-10-29
  Filled 2014-10-29: qty 1

## 2014-10-29 MED ORDER — SODIUM CHLORIDE 0.9 % IJ SOLN: 3.0000 mL | Freq: Two times a day (BID) | INTRAMUSCULAR | Status: DC

## 2014-10-29 MED ORDER — MIDAZOLAM HCL 2 MG/2ML IJ SOLN
INTRAMUSCULAR | Status: AC
  Filled 2014-10-29: qty 2

## 2014-10-29 MED ORDER — SODIUM CHLORIDE 0.9 % IV SOLN: 250.0000 mL | INTRAVENOUS | Status: DC | PRN

## 2014-10-29 MED ORDER — SODIUM CHLORIDE 0.9 % IV SOLN: INTRAVENOUS | Status: AC

## 2014-10-29 MED ORDER — SODIUM CHLORIDE 0.9 % IV SOLN
INTRAVENOUS | Status: DC
  Administered 2014-10-29: 10:00:00 via INTRAVENOUS

## 2014-10-29 MED ORDER — ASPIRIN 81 MG PO CHEW
81.0000 mg | CHEWABLE_TABLET | ORAL | Status: AC
  Administered 2014-10-29: 81 mg via ORAL

## 2014-10-29 MED ORDER — FENTANYL CITRATE 0.05 MG/ML IJ SOLN
INTRAMUSCULAR | Status: AC
  Filled 2014-10-29: qty 2

## 2014-10-29 MED ORDER — SODIUM CHLORIDE 0.9 % IJ SOLN: 3.0000 mL | INTRAMUSCULAR | Status: DC | PRN

## 2014-10-29 MED ORDER — HEPARIN (PORCINE) IN NACL 2-0.9 UNIT/ML-% IJ SOLN
INTRAMUSCULAR | Status: AC
  Filled 2014-10-29: qty 1500

## 2014-10-29 MED ORDER — LIDOCAINE HCL (PF) 1 % IJ SOLN
INTRAMUSCULAR | Status: AC
Start: 2014-10-29 — End: 2014-10-29
  Filled 2014-10-29: qty 30

## 2014-10-29 MED ORDER — VERAPAMIL HCL 2.5 MG/ML IV SOLN
INTRAVENOUS | Status: AC
  Filled 2014-10-29: qty 2

## 2014-10-29 MED ORDER — ASPIRIN 81 MG PO CHEW
CHEWABLE_TABLET | ORAL | Status: AC
  Filled 2014-10-29: qty 1

## 2014-10-29 MED ORDER — NITROGLYCERIN 1 MG/10 ML FOR IR/CATH LAB
INTRA_ARTERIAL | Status: AC
  Filled 2014-10-29: qty 10

## 2014-10-29 NOTE — CV Procedure (Signed)
      Cardiac Catheterization Operative Report  Kara Bailey [id number] 12/18/201510:53 AM [name], MD  Procedure Performed:  1. Left Heart Catheterization 2. Selective Coronary Angiography 3. Left ventricular angiogram  Operator: Lauree Chandler, MD  Arterial access site:  Right radial artery.   Indication:  58 yo female with history of tobacco abuse with recent chest pain and SOB. Stress myoview read as intermediate risk study with possible ischemia.                                  Procedure Details: The risks, benefits, complications, treatment options, and expected outcomes were discussed with the patient. The patient and/or family concurred with the proposed plan, giving informed consent. The patient was brought to the cath lab after IV hydration was begun and oral premedication was given. The patient was further sedated with Versed and Fentanyl. The right wrist was assessed with a modified Allens test which was positive. The right wrist was prepped and draped in a sterile fashion. 1% lidocaine was used for local anesthesia. Using the modified Seldinger access technique, a 5 French sheath was placed in the right radial artery. 3 mg Verapamil was given through the sheath. 3000 units IV heparin was given. Standard diagnostic catheters were used to perform selective coronary angiography. A pigtail catheter was used to perform a left ventricular angiogram. The sheath was removed from the right radial artery and a Terumo hemostasis band was applied at the arteriotomy site on the right wrist.   There were no immediate complications. The patient was taken to the recovery area in stable condition.   Hemodynamic Findings: Central aortic pressure: 134/58 Left ventricular pressure: 134/0/12  Angiographic Findings:  Left main: Short vessel, no obstructive disease.   Left Anterior Descending Artery:  Large caliber vessel that courses to the the apex. There are several small  caliber diagonal branches. No obstructive disease.   Circumflex Artery: Large caliber vessel with large obtuse marginal branch. Small second obtuse marginal branch. No obstructive disease.   Right Coronary Artery: Large caliber dominant vessel with no obstructive disease.   Left Ventricular Angiogram: LVEF=70-7580%.   Impression: 1. No angiographic evidence of CAD 2. Hyperdynamic LV systolic function  Recommendations: No further ischemic evaluation.        Complications:  None. The patient tolerated the procedure well.

## 2014-10-29 NOTE — Discharge Instructions (Signed)
Return To Work ____________________________[name]______________________ was treated at our facility. 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 Your employer should be aware of your condition and can help with the necessary work activity restrictions. If you wish to return to work sooner than the date above, or if you have further problems which make it difficult for you to return at that time, please call us or your caregiver. _________________________________________ Physician Name (Printed) _________________________________________ Physician Signature  _________________________________________ Date Document Released: 10/29/2005 Document Revised: 01/21/2012 Document Reviewed: 04/15/2007 ExitCare Patient Information 2015 [place], [place]. This information is not intended to replace advice given to you by your health care provider. Make sure you discuss any questions you have with your health care provider.

## 2014-10-29 NOTE — Interval H&P Note (Signed)
History and Physical Interval Note:  10/29/2014 10:24 AM  Kara Bailey  has presented today for cardiac cath with the diagnosis of sob, abnormal stress test.   The various methods of treatment have been discussed with the patient and family. After consideration of risks, benefits and other options for treatment, the patient has consented to  Procedure(s): LEFT HEART CATHETERIZATION WITH CORONARY ANGIOGRAM (N/A) as a surgical intervention .  The patient's history has been reviewed, patient examined, no change in status, stable for surgery.  I have reviewed the patient's chart and labs.  Questions were answered to the patient's satisfaction.    Cath Lab Visit (complete for each Cath Lab visit)  Clinical Evaluation Leading to the Procedure:   ACS: No.  Non-ACS:    Anginal Classification: CCS II  Anti-ischemic medical therapy: Minimal Therapy (1 class of medications)  Non-Invasive Test Results: Intermediate-risk stress test findings: cardiac mortality 1-3%/year  Prior CABG: No previous CABG        MCALHANY,CHRISTOPHER

## 2014-10-29 NOTE — H&P (View-Only) (Signed)
[place], [address] [place], [place]  [id number] Phone: [phone] Fax:  [phone]  Date:  10/22/2014   ID:  Kara Bailey, DOB 10/09/1956, MRN [id number]  PCP:  [name], MD  Cardiologist:  Fransico Him, MD    History of Present Illness: Kara Bailey is a 58 y.o. female with a history of PAC's and PVC's, Hep C, chest pain and SOB. She was seen in [facility] with a diagnosis of chest pain and SOB by ER report as well as palpitations and chest xray showed PNA and she was treated with antibiotics. She states that she did not have CP at that time. She has had some episodes of chest pain that are very infrequent and last off and on for about 30 minutes and resolves. She describes it as a pressure with no radiation and associated diaphoresis or nausea. The pressure only occurs with her palpitations. She has problems with SOB for the past few months. The SOB occurs at any time. Her main complaint is the palpitations. She recently wore a heart monitor that showed PVC's and PAC's. She was started on Toprol XL 25mg  daily.  2D echo showed normal LVF.  Nuclear stress test showed an intermediate risk stress nuclear study with possible mild ischemia involving the inferolateral wall as well as anteroseptal wall.  She now presents today to discuss the results of her study. She continues to have SOB but her palpitations have improved on Toprol.   Wt Readings from Last 3 Encounters:  10/22/14 112 lb (50.803 kg)  10/21/14 112 lb (50.803 kg)  10/19/14 108 lb (48.988 kg)     Past Medical History  Diagnosis Date  . Hepatitis C   . Shingles     left side  . Hepatitis C virus infection   . Palpitations   . PAC (premature atrial contraction) 10/11/2014  . PVC (premature ventricular contraction) 10/11/2014  . Dyspnea 08/2014  . Chest pain 08/2014    unspecified  . Tachycardia 08/2014  . Dizziness 08/2014  . Weakness 08/2014    Current Outpatient Prescriptions  Medication Sig  Dispense Refill  . ALPRAZolam (XANAX) 0.5 MG tablet Take 1 tablet (0.5 mg total) by mouth 3 (three) times daily as needed for anxiety. 30 tablet 0  . metoprolol succinate (TOPROL-XL) 25 MG 24 hr tablet Take 1 tablet (25 mg total) by mouth daily. Take with or immediately following a meal. 30 tablet 6  . omeprazole (PRILOSEC) 20 MG capsule Take 20 mg by mouth daily.     No current facility-administered medications for this visit.    Allergies:   No Known Allergies  Social History:  The patient  reports that she has been smoking Cigarettes.  She started smoking about 45 years ago. She has been smoking about 0.25 packs per day. She has never used smokeless tobacco. She reports that she drinks alcohol. She reports that she does not use illicit drugs.   Family History:  The patient's family history includes Alzheimer's disease in her father; CVA in her father; Heart attack in her mother; Heart disease in her mother; Heart failure in her mother; Hyperlipidemia in her mother; Hypertension in her mother. There is no history of Cancer.   ROS:  Please see the history of present illness.      All other systems reviewed and negative.   PHYSICAL EXAM: VS:  BP 122/70 mmHg  Pulse 75  Ht 5\' 5"  (1.651 m)  Wt 112 lb (50.803 kg)  BMI 18.64 kg/m2 Well nourished, well developed, in no acute distress HEENT: normal Neck: no JVD Cardiac:  normal S1, S2; RRR; no murmur Lungs:  clear to auscultation bilaterally, no wheezing, rhonchi or rales Abd: soft, nontender, no hepatomegaly Ext: no edema Skin: warm and dry Neuro:  CNs 2-12 intact, no focal abnormalities noted   ASSESSMENT AND PLAN:  1.  Palpitations with PVC's and PAC's on heart monitor.  Improved on Toprol 2.  Chest pain and SOB in the setting of PNA. She was treated with antibiotics but still has intermittent chest pressure that only comes with the palpitations. The SOB has continued despite treatment of her PNA. Her CRF include ongoing tobacco  use and family history of CAD.Stress myoview showed an intermediate risk study with possible mild ischemia involving the inferolateral wall as well as anteroseptal wall.  She continue to have SOB so I have recommended that we proceed with cardiac catheterization. Cardiac catheterization was discussed with the patient fully including risks on myocardial infarction, death, stroke, bleeding, arrhythmia, dye allergy, renal insufficiency or bleeding.  All patient questions and concerns were discussed and the patient understands and is willing to proceed.  I have instructed her to call [phone] if she has any CP in the interim that does not resolve with rest and go to the ER.  I have asked her to start an 81mg  ASA daily 3.  PVC's and PAC's - she is symptomatic with these and her BP is borderline elevated so I will start her on Toprol XL 25mg  daily 4. Tobacco abuse - I have encouraged her to try to quit 5. Elevated BP - improved on Toprol    Followup PRN after cath   Signed, Fransico Him, MD [facility] 10/22/2014 3:30 PM

## 2014-11-08 ENCOUNTER — Telehealth: Payer: Self-pay | Admitting: Internal Medicine

## 2014-11-08 NOTE — Telephone Encounter (Signed)
Patient is calling to request a med refill for ALPRAZolam (XANAX) 0.5 MG tablet. Please f/u with pt.

## 2014-11-09 ENCOUNTER — Telehealth: Payer: Self-pay | Admitting: Internal Medicine

## 2014-11-09 ENCOUNTER — Telehealth: Payer: Self-pay

## 2014-11-09 DIAGNOSIS — F411 Generalized anxiety disorder: Secondary | ICD-10-CM

## 2014-11-09 MED ORDER — ALPRAZOLAM 0.5 MG PO TABS: 0.5000 mg | ORAL_TABLET | Freq: Three times a day (TID) | ORAL | Status: DC | PRN

## 2014-11-09 NOTE — Telephone Encounter (Signed)
Patient called requesting a refill on her xanax Prescription printed and is at the front desk

## 2014-11-09 NOTE — Telephone Encounter (Signed)
Patient LVM requesting medication refill for ALPRAZolam (XANAX) 0.5 MG tablet. Please f/u with pt.

## 2014-11-11 ENCOUNTER — Ambulatory Visit (HOSPITAL_COMMUNITY): Payer: Self-pay

## 2014-11-22 ENCOUNTER — Ambulatory Visit (INDEPENDENT_AMBULATORY_CARE_PROVIDER_SITE_OTHER): Payer: Self-pay | Admitting: Internal Medicine

## 2014-11-22 VITALS — BP 151/98 | HR 82 | Temp 98.6°F | Wt 111.0 lb

## 2014-11-22 DIAGNOSIS — B182 Chronic viral hepatitis C: Secondary | ICD-10-CM

## 2014-11-22 DIAGNOSIS — Z23 Encounter for immunization: Secondary | ICD-10-CM

## 2014-11-22 NOTE — Progress Notes (Signed)
   Subjective:    Patient ID: Kara Bailey, female    DOB: 03/15/1956, 59 y.o.   MRN: [id number]  HPI Here for follow up of hepatitis C.  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  Elastography not yet done.  No new issues.     Review of Systems  Constitutional: Negative for fatigue.  Gastrointestinal: Negative for nausea and diarrhea.  Skin: Negative for rash.  Neurological: Negative for dizziness and light-headedness.       Objective:   Physical Exam  Constitutional: She appears well-developed and well-nourished. No distress.  Eyes: No scleral icterus.  Cardiovascular: Normal rate, regular rhythm and normal heart sounds.   No murmur heard. Pulmonary/Chest: Effort normal and breath sounds normal. No respiratory distress.  Skin: No rash noted.          Assessment & Plan:

## 2014-11-22 NOTE — Assessment & Plan Note (Signed)
Will get elastography and start PA process for [place].

## 2014-12-08 ENCOUNTER — Telehealth: Payer: Self-pay

## 2014-12-08 NOTE — Telephone Encounter (Signed)
Returned patient phone call Patient not available Left message on voice mail to return our call 

## 2014-12-10 ENCOUNTER — Other Ambulatory Visit: Payer: Self-pay

## 2014-12-10 ENCOUNTER — Telehealth: Payer: Self-pay | Admitting: Internal Medicine

## 2014-12-10 DIAGNOSIS — F411 Generalized anxiety disorder: Secondary | ICD-10-CM

## 2014-12-10 MED ORDER — ALPRAZOLAM 0.5 MG PO TABS: 0.5000 mg | ORAL_TABLET | Freq: Three times a day (TID) | ORAL | Status: DC | PRN

## 2014-12-10 NOTE — Telephone Encounter (Signed)
Patient called to request a med refill for ALPRAZolam (XANAX) 0.5 MG tablet. Please f/u with pt.

## 2014-12-10 NOTE — Progress Notes (Unsigned)
Patient here in office requesting a refill on her xanax Dr Annitta Needs is out of the office until Monday Spoke with Dr Doreene Burke and we will refill prescription

## 2014-12-15 ENCOUNTER — Ambulatory Visit (HOSPITAL_COMMUNITY): Payer: MEDICAID

## 2015-01-12 ENCOUNTER — Telehealth: Payer: Self-pay

## 2015-01-12 DIAGNOSIS — F411 Generalized anxiety disorder: Secondary | ICD-10-CM

## 2015-01-12 MED ORDER — ALPRAZOLAM 0.5 MG PO TABS: 0.5000 mg | ORAL_TABLET | Freq: Three times a day (TID) | ORAL | Status: DC | PRN

## 2015-01-12 NOTE — Telephone Encounter (Signed)
Refilled xanax for next 4-6 weeks.  Patient will need to seek mental health services for additional refills: [facility], [facility], [facility].  Xanax is not the preferred longterm medication for anxiety in anyone especially those > 61 yrs old

## 2015-01-12 NOTE — Telephone Encounter (Signed)
Called patient to let her know her prescription for xanax was ready Left message on voice mail she can pick it up at the front desk

## 2015-01-12 NOTE — Telephone Encounter (Signed)
Patient requesting a refill on her xanax Can we refill this for the patient

## 2015-03-10 ENCOUNTER — Telehealth: Payer: Self-pay

## 2015-03-10 DIAGNOSIS — F411 Generalized anxiety disorder: Secondary | ICD-10-CM

## 2015-03-10 MED ORDER — ALPRAZOLAM 0.5 MG PO TABS: 0.5000 mg | ORAL_TABLET | Freq: Three times a day (TID) | ORAL | Status: DC | PRN

## 2015-03-10 NOTE — Telephone Encounter (Signed)
Pt called back and was informed prescription was ready

## 2015-03-10 NOTE — Telephone Encounter (Signed)
Attempted to call patient to let her know her prescription is ready for pick up Patient not available Message left on machine to return our call

## 2015-03-10 NOTE — Telephone Encounter (Signed)
Patient called requesting a refill on her xanax Per Dr Annitta Needs we can refill her prescription

## 2015-09-09 ENCOUNTER — Ambulatory Visit: Payer: Self-pay | Attending: Family Medicine

## 2015-09-09 ENCOUNTER — Telehealth: Payer: Self-pay | Admitting: Internal Medicine

## 2015-09-09 NOTE — Telephone Encounter (Signed)
Patient is hoping to receive a sample for ALPRAZolam Duanne Moron) 0.5 MG tablet until she establishes care here with a new pcp in the following week or two. Please follow up with pt. Thank you.

## 2015-09-12 NOTE — Telephone Encounter (Signed)
Patient is hoping to receive a sample for ALPRAZolam Duanne Moron) 0.5 MG tablet until she establishes care here with a new pcp in the following week or two. Please follow up with pt. Thank you.

## 2015-09-13 ENCOUNTER — Telehealth: Payer: Self-pay

## 2015-09-13 NOTE — Telephone Encounter (Signed)
Returned patient phone call Patient was requesting refills on her xanax Patient has not been seen here in a year Patient was referred to [facility] and phone number given as well

## 2015-09-26 ENCOUNTER — Encounter: Payer: Self-pay | Admitting: Family Medicine

## 2015-09-26 ENCOUNTER — Ambulatory Visit: Payer: Self-pay | Attending: Family Medicine | Admitting: Family Medicine

## 2015-09-26 VITALS — BP 167/93 | HR 74 | Temp 98.8°F | Resp 16 | Ht 65.0 in | Wt 111.0 lb

## 2015-09-26 DIAGNOSIS — B182 Chronic viral hepatitis C: Secondary | ICD-10-CM | POA: Insufficient documentation

## 2015-09-26 DIAGNOSIS — F411 Generalized anxiety disorder: Secondary | ICD-10-CM | POA: Insufficient documentation

## 2015-09-26 DIAGNOSIS — I10 Essential (primary) hypertension: Secondary | ICD-10-CM | POA: Insufficient documentation

## 2015-09-26 DIAGNOSIS — K219 Gastro-esophageal reflux disease without esophagitis: Secondary | ICD-10-CM | POA: Insufficient documentation

## 2015-09-26 DIAGNOSIS — Z23 Encounter for immunization: Secondary | ICD-10-CM

## 2015-09-26 DIAGNOSIS — E1165 Type 2 diabetes mellitus with hyperglycemia: Secondary | ICD-10-CM | POA: Insufficient documentation

## 2015-09-26 DIAGNOSIS — IMO0002 Reserved for concepts with insufficient information to code with codable children: Secondary | ICD-10-CM | POA: Insufficient documentation

## 2015-09-26 LAB — COMPLETE METABOLIC PANEL WITH GFR
ALK PHOS: 118 U/L (ref 33–130)
ALT: 67 U/L — AB (ref 6–29)
AST: 70 U/L — AB (ref 10–35)
Albumin: 4.4 g/dL (ref 3.6–5.1)
BILIRUBIN TOTAL: 1.1 mg/dL (ref 0.2–1.2)
BUN: 6 mg/dL — AB (ref 7–25)
CO2: 28 mmol/L (ref 20–31)
CREATININE: 0.76 mg/dL (ref 0.50–0.99)
Calcium: 10.3 mg/dL (ref 8.6–10.4)
Chloride: 98 mmol/L (ref 98–110)
GFR, Est African American: >89 mL/min (ref >=60)
GFR, Est Non African American: 85 mL/min (ref >=60)
GLUCOSE: 101 mg/dL — AB (ref 65–99)
Potassium: 5.1 mmol/L (ref 3.5–5.3)
SODIUM: 136 mmol/L (ref 135–146)
TOTAL PROTEIN: 7.5 g/dL (ref 6.1–8.1)

## 2015-09-26 MED ORDER — PAROXETINE HCL 20 MG PO TABS: 20.0000 mg | ORAL_TABLET | Freq: Every day | ORAL | Status: DC

## 2015-09-26 MED ORDER — PANTOPRAZOLE SODIUM 40 MG PO TBEC: 40.0000 mg | DELAYED_RELEASE_TABLET | Freq: Every day | ORAL | Status: DC

## 2015-09-26 MED ORDER — METOPROLOL SUCCINATE ER 50 MG PO TB24: 50.0000 mg | ORAL_TABLET | Freq: Every day | ORAL | Status: DC

## 2015-09-26 MED ORDER — ALPRAZOLAM 0.5 MG PO TABS: 0.5000 mg | ORAL_TABLET | Freq: Three times a day (TID) | ORAL | Status: DC | PRN

## 2015-09-26 NOTE — Progress Notes (Signed)
Patient ID: Kara Bailey, female   DOB: Mar 17, 1956, 59 y.o.   MRN: [id number]   Subjective:  Patient ID: Kara Bailey, female    DOB: 1956-02-26  Age: 59 y.o. MRN: [id number]  CC: Medication Refill and Abdominal Cramping   HPI Kara Bailey presents for    1. HTN: taking toprol XL at night. No HA, CP, SOB or edema.  2. GERD: tried prilosec. No improvement. Has frequent epigastric pain. Smokes. Drinks 6 cans of beer between Friday and Saturday.  3. Hep C: desires treatment. Did not f/u with ID due to orange card expiring. Has one hep B vaccine to go to complete series.  4. Anxiety: chronic. Xanax helps. Has run out. Admits to depressed mood as well. Interested in daily anti-anxiety medicine.   Outpatient Prescriptions Prior to Visit  Medication Sig Dispense Refill  . metoprolol succinate (TOPROL-XL) 25 MG 24 hr tablet Take 1 tablet (25 mg total) by mouth daily. Take with or immediately following a meal. 30 tablet 6  . ALPRAZolam (XANAX) 0.5 MG tablet Take 1 tablet (0.5 mg total) by mouth 3 (three) times daily as needed for anxiety. (Patient not taking: Reported on 09/26/2015) 45 tablet 0  . naproxen sodium (ANAPROX) 220 MG tablet Take 220 mg by mouth 2 (two) times daily as needed (pain).    Marland Kitchen omeprazole (PRILOSEC) 20 MG capsule Take 20 mg by mouth daily.     No facility-administered medications prior to visit.    ROS Review of Systems  Constitutional: Negative for fever and chills.  Eyes: Negative for visual disturbance.  Respiratory: Negative for shortness of breath.   Cardiovascular: Negative for chest pain.  Gastrointestinal: Positive for abdominal pain. Negative for blood in stool.  Musculoskeletal: Negative for back pain and arthralgias.  Skin: Negative for rash.  Allergic/Immunologic: Negative for immunocompromised state.  Hematological: Negative for adenopathy. Does not bruise/bleed easily.  Psychiatric/Behavioral: Positive for dysphoric mood. Negative for  suicidal ideas. The patient is nervous/anxious.     Objective:  BP 167/93 mmHg  Pulse 74  Temp(Src) 98.8 F (37.1 C) (Oral)  Resp 16  Ht 5\' 5"  (1.651 m)  Wt 111 lb (50.349 kg)  BMI 18.47 kg/m2  SpO2 100%  BP/Weight 09/26/2015 11/22/2014 [id number]  Systolic BP [id number]  Diastolic BP 93 98 67  Wt. (Lbs) 111 111 114  BMI 18.47 18.47 18.97   Physical Exam  Constitutional: She is oriented to person, place, and time. She appears well-developed and well-nourished. No distress.  HENT:  Head: Normocephalic and atraumatic.  Cardiovascular: Normal rate, regular rhythm, normal heart sounds and intact distal pulses.   Pulmonary/Chest: Effort normal and breath sounds normal.  Musculoskeletal: She exhibits no edema.  Neurological: She is alert and oriented to person, place, and time.  Skin: Skin is warm and dry. No rash noted.  Psychiatric: She has a normal mood and affect.    GAD 7 : Generalized Anxiety Score 09/26/2015  Nervous, Anxious, on Edge 3  Control/stop worrying 1  Worry too much - different things 1  Trouble relaxing 3  Restless 0  Easily annoyed or irritable 3  Afraid - awful might happen 0  Total GAD 7 Score 11     Depression screen [facility] 2/9 09/26/2015 09/26/2015 10/21/2014  Decreased Interest 2 0 1  Down, Depressed, Hopeless 1 0 1  PHQ - 2 Score 3 0 2  Altered sleeping 3 - 1  Tired, decreased energy 3 - 1  Change in appetite  2 - 1  Feeling bad or failure about yourself  0 - 1  Trouble concentrating 1 - 1  Moving slowly or fidgety/restless 1 - 0  Suicidal thoughts 0 - 0  PHQ-9 Score 13 - 7     Assessment & Plan:   Problem List Items Addressed This Visit    Chronic hepatitis C without hepatic coma (HCC) (Chronic)   Relevant Orders   COMPLETE METABOLIC PANEL WITH GFR   Ambulatory referral to Infectious Disease   Hepatitis B vaccine adult IM (Completed)   HTN (hypertension) - Primary (Chronic)   Relevant Medications   metoprolol succinate (TOPROL-XL)  50 MG 24 hr tablet    Other Visit Diagnoses    Anxiety state        Relevant Medications    ALPRAZolam (XANAX) 0.5 MG tablet    PARoxetine (PAXIL) 20 MG tablet    Gastroesophageal reflux disease, esophagitis presence not specified        Relevant Medications    pantoprazole (PROTONIX) 40 MG tablet       No orders of the defined types were placed in this encounter.    Follow-up: No Follow-up on file.   Boykin Nearing MD

## 2015-09-26 NOTE — Progress Notes (Signed)
Medicine refills C/C abdominal pain, cramping and gas pain.  Pain scale #0  No suicide thought in the past two weeks  Tobacco user 1ppday

## 2015-09-26 NOTE — Patient Instructions (Addendum)
Kara Bailey was seen today for medication refill and abdominal cramping.  Diagnoses and all orders for this visit:  Essential hypertension -     metoprolol succinate (TOPROL-XL) 50 MG 24 hr tablet; Take 1 tablet (50 mg total) by mouth daily. Take with or immediately following a meal.  Anxiety state -     ALPRAZolam (XANAX) 0.5 MG tablet; Take 1 tablet (0.5 mg total) by mouth 3 (three) times daily as needed for anxiety. -     PARoxetine (PAXIL) 20 MG tablet; Take 1 tablet (20 mg total) by mouth daily.  Chronic hepatitis C without hepatic coma (HCC) -     COMPLETE METABOLIC PANEL WITH GFR -     Ambulatory referral to Infectious Disease  Gastroesophageal reflux disease, esophagitis presence not specified -     pantoprazole (PROTONIX) 40 MG tablet; Take 1 tablet (40 mg total) by mouth daily.   F./u in 4-6 weeks for pap smear  Dr. Adrian Blackwater

## 2015-10-05 ENCOUNTER — Telehealth: Payer: Self-pay | Admitting: *Deleted

## 2015-10-05 DIAGNOSIS — I10 Essential (primary) hypertension: Secondary | ICD-10-CM

## 2015-10-05 DIAGNOSIS — F411 Generalized anxiety disorder: Secondary | ICD-10-CM

## 2015-10-05 DIAGNOSIS — K219 Gastro-esophageal reflux disease without esophagitis: Secondary | ICD-10-CM

## 2015-10-05 NOTE — Telephone Encounter (Signed)
-----   Message from Boykin Nearing, MD sent at 09/27/2015  9:16 AM EST ----- Liver enzymes slightly elevated, otherwise normal CMP

## 2015-10-05 NOTE — Telephone Encounter (Signed)
Patient returned phone call, please f/u with pt.    °

## 2015-10-05 NOTE — Telephone Encounter (Signed)
LVM to return call.

## 2015-10-10 ENCOUNTER — Telehealth: Payer: Self-pay | Admitting: Internal Medicine

## 2015-10-10 DIAGNOSIS — M62830 Muscle spasm of back: Secondary | ICD-10-CM

## 2015-10-10 MED ORDER — PAROXETINE HCL 20 MG PO TABS: 20.0000 mg | ORAL_TABLET | Freq: Every day | ORAL | Status: DC

## 2015-10-10 MED ORDER — PANTOPRAZOLE SODIUM 40 MG PO TBEC: 40.0000 mg | DELAYED_RELEASE_TABLET | Freq: Every day | ORAL | Status: DC

## 2015-10-10 MED ORDER — METOPROLOL SUCCINATE ER 50 MG PO TB24: 50.0000 mg | ORAL_TABLET | Freq: Every day | ORAL | Status: DC

## 2015-10-10 NOTE — Telephone Encounter (Signed)
Patient called and requested to speak with PCP in regards to medication, please f/u with pt.

## 2015-10-10 NOTE — Telephone Encounter (Signed)
Date of birth verified by pt Results given  Slightly elevated liver enzymes, otherwise normal

## 2015-10-10 NOTE — Telephone Encounter (Signed)
Called back to patient She expressed her grievances about having to wait for medication refills that were sent to [place] instead of [facility] pharmacy  I listened to her concerns, apologized for the delay, informed her that the refills have been sent, and that [facility] is now listed as her preferred pharmacy so this problem is not expected to occur again.  She voiced understanding and expressed gratitude.  Additionally, she inquired about her referral to ID for Hep C. Referral reviewed. It was placed at last OV on 09/26/15. It has not yet been processed.  Finally, she expressed concerns about her chronic back pain and muscle spasm. She is having trouble keeping a job due to pain. She is planning to go out on disability. She is requesting assistance.  Lumbar x-ray ordered PT referral placed.

## 2015-10-13 ENCOUNTER — Ambulatory Visit: Payer: Self-pay | Attending: Internal Medicine

## 2015-10-14 ENCOUNTER — Other Ambulatory Visit: Payer: Self-pay

## 2015-10-14 DIAGNOSIS — B182 Chronic viral hepatitis C: Secondary | ICD-10-CM

## 2015-10-14 LAB — CBC WITH DIFFERENTIAL/PLATELET
Basophils Absolute: 0 10*3/uL (ref 0.0–0.1)
Basophils Relative: 0 % (ref 0–1)
EOS ABS: 0.1 10*3/uL (ref 0.0–0.7)
Eosinophils Relative: 1 % (ref 0–5)
HCT: 45 % (ref 36.0–46.0)
HEMOGLOBIN: 16.4 g/dL — AB (ref 12.0–15.0)
LYMPHS ABS: 2.1 10*3/uL (ref 0.7–4.0)
Lymphocytes Relative: 25 % (ref 12–46)
MCH: 36.1 pg — AB (ref 26.0–34.0)
MCHC: 36.4 g/dL — ABNORMAL HIGH (ref 30.0–36.0)
MCV: 99.1 fL (ref 78.0–100.0)
MONOS PCT: 8 % (ref 3–12)
MPV: 10.8 fL (ref 8.6–12.4)
Monocytes Absolute: 0.7 10*3/uL (ref 0.1–1.0)
Neutro Abs: 5.6 10*3/uL (ref 1.7–7.7)
Neutrophils Relative %: 66 % (ref 43–77)
PLATELETS: 234 10*3/uL (ref 150–400)
RBC: 4.54 MIL/uL (ref 3.87–5.11)
RDW: 13.1 % (ref 11.5–15.5)
WBC: 8.5 10*3/uL (ref 4.0–10.5)

## 2015-10-14 LAB — PROTIME-INR
INR: 1.01 (ref <1.50)
Prothrombin Time: 13.4 seconds (ref 11.6–15.2)

## 2015-10-14 LAB — HEPATITIS B SURFACE ANTIBODY,QUALITATIVE: HEP B S AB: POSITIVE — AB

## 2015-10-14 LAB — HEPATITIS B CORE ANTIBODY, TOTAL: HEP B C TOTAL AB: NONREACTIVE

## 2015-10-14 LAB — HEPATITIS B SURFACE ANTIGEN: Hepatitis B Surface Ag: NEGATIVE

## 2015-10-14 LAB — HEPATITIS A ANTIBODY, TOTAL: Hep A Total Ab: NONREACTIVE

## 2015-10-14 LAB — IRON: Iron: 242 ug/dL — ABNORMAL HIGH (ref 45–160)

## 2015-10-17 LAB — HIV ANTIBODY (ROUTINE TESTING W REFLEX): HIV: NONREACTIVE

## 2015-10-17 LAB — ANA: Anti Nuclear Antibody(ANA): NEGATIVE

## 2015-10-18 ENCOUNTER — Encounter: Payer: Self-pay | Admitting: Physical Therapy

## 2015-10-18 ENCOUNTER — Ambulatory Visit: Payer: Self-pay | Attending: Family Medicine | Admitting: Physical Therapy

## 2015-10-18 DIAGNOSIS — M545 Low back pain, unspecified: Secondary | ICD-10-CM

## 2015-10-18 DIAGNOSIS — R6889 Other general symptoms and signs: Secondary | ICD-10-CM | POA: Insufficient documentation

## 2015-10-18 DIAGNOSIS — M62830 Muscle spasm of back: Secondary | ICD-10-CM | POA: Insufficient documentation

## 2015-10-18 DIAGNOSIS — R293 Abnormal posture: Secondary | ICD-10-CM | POA: Insufficient documentation

## 2015-10-18 DIAGNOSIS — R198 Other specified symptoms and signs involving the digestive system and abdomen: Secondary | ICD-10-CM | POA: Insufficient documentation

## 2015-10-18 NOTE — Patient Instructions (Signed)
Posture Tips DO: - stand tall and erect - keep chin tucked in - keep head and shoulders in alignment - check posture regularly in mirror or large window - pull head back against headrest in car seat;  Change your position often.  Sit with lumbar support. DON'T: - slouch or slump while watching TV or reading - sit, stand or lie in one position  for too long;  Sitting is especially hard on the spine so if you sit at a desk/use the computer, then stand up often!   Copyright  VHI. All rights reserved.  Posture - Standing   Good posture is important. Avoid slouching and forward head thrust. Maintain curve in low back and align ears over shoul- ders, hips over ankles.  Pull your belly button in toward your back bone. Stand with even weight on toes and heels  And ribs lifted up and chin down   Copyright  VHI. All rights reserved.  Posture - Sitting   Sit upright, head facing forward. Try using a roll to support lower back. Keep shoulders relaxed, and avoid rounded back. Keep hips level with knees. Avoid crossing legs for long periods.  Sit on sit bones and not tailbone.    Copyright  VHI. All rights reserved.   Useful before bed at night for pain in order to try to sleep.  You may use heat during day.  But cold can be beneficial to numb area before sleepl.  Cryotherapy Cryotherapy means treatment with cold. Ice or gel packs can be used to reduce both pain and swelling. Ice is the most helpful within the first 24 to 48 hours after an injury or flare-up from overusing a muscle or joint. Sprains, strains, spasms, burning pain, shooting pain, and aches can all be eased with ice. Ice can also be used when recovering from surgery. Ice is effective, has very few side effects, and is safe for most people to use. PRECAUTIONS  Ice is not a safe treatment option for people with:  Raynaud phenomenon. This is a condition affecting small blood vessels in the extremities. Exposure to cold may cause your  problems to return.  Cold hypersensitivity. There are many forms of cold hypersensitivity, including:  Cold urticaria. Red, itchy hives appear on the skin when the tissues begin to warm after being iced.  Cold erythema. This is a red, itchy rash caused by exposure to cold.  Cold hemoglobinuria. Red blood cells break down when the tissues begin to warm after being iced. The hemoglobin that carry oxygen are passed into the urine because they cannot combine with blood proteins fast enough.  Numbness or altered sensitivity in the area being iced. If you have any of the following conditions, do not use ice until you have discussed cryotherapy with your caregiver:  Heart conditions, such as arrhythmia, angina, or chronic heart disease.  High blood pressure.  Healing wounds or open skin in the area being iced.  Current infections.  Rheumatoid arthritis.  Poor circulation.  Diabetes. Ice slows the blood flow in the region it is applied. This is beneficial when trying to stop inflamed tissues from spreading irritating chemicals to surrounding tissues. However, if you expose your skin to cold temperatures for too long or without the proper protection, you can damage your skin or nerves. Watch for signs of skin damage due to cold. HOME CARE INSTRUCTIONS Follow these tips to use ice and cold packs safely.  Place a dry or damp towel between the ice and  skin. A damp towel will cool the skin more quickly, so you may need to shorten the time that the ice is used.  For a more rapid response, add gentle compression to the ice.  Ice for no more than 10 to 20 minutes at a time. The bonier the area you are icing, the less time it will take to get the benefits of ice.  Check your skin after 5 minutes to make sure there are no signs of a poor response to cold or skin damage.  Rest 20 minutes or more between uses.  Once your skin is numb, you can end your treatment. You can test numbness by very  lightly touching your skin. The touch should be so light that you do not see the skin dimple from the pressure of your fingertip. When using ice, most people will feel these normal sensations in this order: cold, burning, aching, and numbness.  Do not use ice on someone who cannot communicate their responses to pain, such as small children or people with dementia. HOW TO MAKE AN ICE PACK Ice packs are the most common way to use ice therapy. Other methods include ice massage, ice baths, and cryosprays. Muscle creams that cause a cold, tingly feeling do not offer the same benefits that ice offers and should not be used as a substitute unless recommended by your caregiver. To make an ice pack, do one of the following:  Place crushed ice or a bag of frozen vegetables in a sealable plastic bag. Squeeze out the excess air. Place this bag inside another plastic bag. Slide the bag into a pillowcase or place a damp towel between your skin and the bag.  Mix 3 parts water with 1 part rubbing alcohol. Freeze the mixture in a sealable plastic bag. When you remove the mixture from the freezer, it will be slushy. Squeeze out the excess air. Place this bag inside another plastic bag. Slide the bag into a pillowcase or place a damp towel between your skin and the bag. SEEK MEDICAL CARE IF:  You develop white spots on your skin. This may give the skin a blotchy (mottled) appearance.  Your skin turns blue or pale.  Your skin becomes waxy or hard.  Your swelling gets worse. MAKE SURE YOU:   Understand these instructions.  Will watch your condition.  Will get help right away if you are not doing well or get worse. Document Released: 06/25/2011 Document Revised: 03/15/2014 Document Reviewed: 06/25/2011 [facility] Patient Information 2015 [place], [place]. This information is not intended to replace advice given to you by your health care provider. Make sure you discuss any questions you have with your health care  provider.   Pt given information of quadratus lumborum and right sidelying stretch and prayer stretch.  Kara Bailey, PT 10/18/2015 12:04 PM Phone: [phone] Fax: [phone]

## 2015-10-18 NOTE — Therapy (Signed)
[place], [place], [address] Phone: [phone]   Fax:  [phone]  Physical Therapy Evaluation  Patient Details  Name: Kara Bailey MRN: [id number] Date of Birth: 1956-07-14 Referring Provider: Boykin Nearing MD  Encounter Date: 10/18/2015      PT End of Session - 10/18/15 1205    Visit Number 1   Number of Visits 12   Date for PT Re-Evaluation 11/29/15   Authorization Type 100 % financial aid   PT Start Time 1108   PT Stop Time 1216   PT Time Calculation (min) 68 min   Activity Tolerance Patient limited by pain   Behavior During Therapy [facility] for tasks assessed/performed      Past Medical History  Diagnosis Date  . Hepatitis C   . Shingles     left side  . Hepatitis C virus infection   . Palpitations   . PAC (premature atrial contraction) 10/11/2014  . PVC (premature ventricular contraction) 10/11/2014  . Dyspnea 08/2014  . Chest pain 08/2014    unspecified  . Tachycardia 08/2014  . Dizziness 08/2014  . Weakness 08/2014    Past Surgical History  Procedure Laterality Date  . Appendectomy    . Left heart catheterization with coronary angiogram N/A 10/29/2014    Procedure: LEFT HEART CATHETERIZATION WITH CORONARY ANGIOGRAM;  Surgeon: Burnell Blanks, MD;  Location: [facility] CATH LAB;  Service: Cardiovascular;  Laterality: N/A;    There were no vitals filed for this visit.  Visit Diagnosis:  Back muscle spasm  Abnormal posture  Left-sided low back pain without sciatica  Activity intolerance  Abdominal weakness      Subjective Assessment - 10/18/15 1119    Subjective I keep trying to work but I can't work more than 2 weeks without my back in spasm.  I worked in [employer] work and I had to leave last time due to back pain.  lMust use the grocery cart to walk in store for groceries   Pertinent History Pt with Chronic Hepatitis C , back pain for > 5 years   Limitations House  hold activities;Sitting;Standing;Walking  wakes me up from my sleep   How long can you sit comfortably? 15 min   How long can you stand comfortably? 10 min   How long can you walk comfortably? 10 min   Patient Stated Goals Get rid of pain.  Be able to work,  i have trouble vacuuming and sweeping   Currently in Pain? Yes   Pain Score 10-Worst pain ever   Pain Location Back   Pain Orientation Mid;Right;Left  left > Right   Pain Descriptors / Indicators Aching   Pain Type Chronic pain   Pain Radiating Towards from left back to right back   Pain Onset More than a month ago   Pain Frequency Constant   Aggravating Factors  household chores,  walking too long   Pain Relieving Factors nothing seems to help            [facility] PT Assessment - 10/18/15 1124    Assessment   Medical Diagnosis low back pain and spasm   Referring Provider Boykin Nearing MD   Onset Date/Surgical Date --  greater than 5 years of pain   Hand Dominance Right   Prior Therapy none   Precautions   Precautions Fall   Precaution Comments Chronic Hepatitis C    Restrictions   Weight Bearing Restrictions No   Balance Screen  Has the patient fallen in the past 6 months Yes   How many times? 1  misjudged sitting on bench   Has the patient had a decrease in activity level because of a fear of falling?  No   Is the patient reluctant to leave their home because of a fear of falling?  No   Home Ecologist residence   Living Arrangements Spouse/significant other   [place] to enter;Ramped entrance   Entrance Stairs-Number of Steps [place] One level   Prior Function   Level of [place]   Overall Cognitive Status Within Functional Limits for tasks assessed   Observation/Other Assessments   Focus on Therapeutic Outcomes (FOTO)  Intake 26%, limitation 74%, predicted 52%   Posture/Postural Control   Posture/Postural Control Postural  limitations   Postural Limitations Anterior pelvic tilt;Rounded Shoulders;Forward head;Weight shift right   Posture Comments ectomorphic body type. pt leans to the right in stance and sitting, Pt with right slight convex scoliosis, left pelvic level elevated compared to right   AROM   Lumbar Flexion 55  pain   Lumbar Extension 20  pain    Lumbar - Right Side Bend 25  pain   Lumbar - Left Side Bend 25  Pain   Lumbar - Right Rotation 60%   Lumbar - Left Rotation 90%   Strength   Overall Strength Unable to assess   Overall Strength Comments Pt states 10/10 pain and difficulty lifting legs due to Left back spasm, abdominal weakness, dificulty raising legs in hooklying 3-/5    Flexibility   Hamstrings Right 60 degrees,  left 55 pain    Palpation   Palpation comment pt with marked tenderness over left  Quadratus lumborum.  No pain over PA mobs gentrl over lumbar spinous processes   Slump test   Findings Negative   Side Left   other   Findings Positive  Prone instability test   Side  Left   Comments Pt with relief with compression of L5 s1                   [facility] Adult PT Treatment/Exercise - 10/18/15 1124    Lumbar Exercises: Stretches   Quadruped Mid Back Stretch 2 reps;30 seconds   Quadruped Mid Back Stretch Limitations use of pillow on legs in order to provide comfort in stretch buttocks to legs    Lumbar Exercises: Sidelying   Other Sidelying Lumbar Exercises right sidlying quadratus lumborum  for  75mini   Moist Heat Therapy   Number Minutes Moist Heat 15 Minutes   Moist Heat Location Lumbar Spine  with incline wedge   Electrical Stimulation   Electrical Stimulation Location low back    Electrical Stimulation Action IFC   Electrical Stimulation Parameters pt tolerance for 15 min   Electrical Stimulation Goals Pain   Manual Therapy   Soft tissue mobilization right sidelying left quadratus lumborum                PT Education - 10/18/15 1235     Education provided Yes   Education Details POC, Explanation of findings,  posture standing and sitting, cryotherapy, quadrauts stretch initial HEP   Person(s) Educated Patient   Methods Explanation;Demonstration;Tactile cues;Handout;Verbal cues   Comprehension Verbalized understanding;Returned demonstration          PT Short Term Goals - 10/18/15 1242    PT SHORT TERM GOAL #1   Title "  Independent with initial HEP   Time 3   Period Weeks   Status New   PT SHORT TERM GOAL #2   Title "Report pain decrease from 10/10 to5 /10   Time 3   Period Weeks   Status New   PT SHORT TERM GOAL #3   Title "Demonstrate understanding of proper sitting posture and be more conscious of position and posture throughout the day.    Time 3   Period Weeks   Status New   PT SHORT TERM GOAL #4   Title Pt will demonstrate proper body mechanics for utilizing broom and vacuum   Time 3   Period Weeks   Status New           PT Long Term Goals - 10/18/15 1243    PT LONG TERM GOAL #1   Title "Demonstrate and verbalize techniques to reduce the risk of re-injury including: lifting, posture, body mechanics.    Time 6   Period Weeks   Status New   PT LONG TERM GOAL #2   Title "Pt will be independent with advanced HEP   Time 6   Period Weeks   Status New   PT LONG TERM GOAL #3   Title "Pain will decrease to 3/10 or less with all functional activities   Time 6   Period Weeks   Status New   PT LONG TERM GOAL #4   Title "Pt will not wake due to pain while turning in bed while sleeping at night   Time 6   Period Weeks   Status New   PT LONG TERM GOAL #5   Title "FOTO will improve from 74%limitation   to 52%limtation     indicating improved functional mobility.    Time 6   Period Weeks   Status New   PT LONG TERM GOAL #6   Title Pt will be able to grocery shop for 1 hour without dependence of utilizing grocery cart for UE support   Time 6   Period Weeks   Status New   PT LONG TERM GOAL #7    Title Pt will be able to  stand for  2 hours in order to return to work/ normal activities  as able   Time 6   Period Weeks   Status New               Plan - 10/18/15 1236    Clinical Impression Statement 59 yo presents with > 5 year history of back pain with recent exacerbation of muscle spasm of left quadratus lumborum and core weakness and Left lower extremity weakness due to pain level stated at 10/10, Pt with marked tenderness of left quadratus and responded to soft tissue work  for quadratus lumborum .  Pt with postitive prone instability test for hypermobility of lower spine and will benfit from core  strengthening. as well. Pt with deficits in strength, ROM,, postural awareness, body mechanics, pain management.  Core strength defict  Pt would benefit from Pilates and  core strength and modalities to address pain 2 x a week fdor 6 weeks.  Pt is undergoing further testing /xray/MRI.   Pt will benefit from skilled therapeutic intervention in order to improve on the following deficits Pain;Decreased strength;Decreased range of motion;Decreased activity tolerance;Postural dysfunction;Improper body mechanics;Increased muscle spasms;Hypermobility   Rehab Potential Good   PT Frequency 2x / week   PT Duration 6 weeks   PT Treatment/Interventions ADLs/Self Care Home Management;Cryotherapy;Electrical Stimulation;Iontophoresis 4mg /ml  Dexamethasone;Moist Heat;Ultrasound;Therapeutic activities;Therapeutic exercise;Manual techniques;Patient/family education;Neuromuscular re-education;Passive range of motion;Dry needling;Taping   PT Next Visit Plan Begin Abdominal Core and modalites for pain as needed.  REview exericsise and assess benefit of Quadratus stretch/kprayer stretch   PT Home Exercise Plan Prayer stretch and quadratus lumborum  right sidelying   Consulted and Agree with Plan of Care Patient         Problem List Patient Active Problem List   Diagnosis Date Noted  . HTN  (hypertension) 09/26/2015  . Chest pain 10/14/2014  . PAC (premature atrial contraction) 10/11/2014  . PVC (premature ventricular contraction) 10/11/2014  . Back muscle spasm 06/15/2014  . Anxiety state, unspecified 06/15/2014  . Smoking 06/15/2014  . 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 Chronic hepatitis C without hepatic coma ([place]) 05/28/2013  . Cirrhosis of liver ([place]) 05/20/2013  . Acute hepatic failure 05/07/2013  . Jaundice 05/04/2013  . Transaminitis 05/04/2013  . ARF (acute renal failure) ([place]) 05/04/2013   Voncille Lo, PT 10/18/2015 12:56 PM Phone: [phone] Fax: [phone]  By signing I understand that I am ordering/authorizing the use of Iontophoresis using 4 mg/mL of dexamethasone as a component of this plan of care. [place], [place], [address] Phone: [phone]   Fax:  [phone]  Name: WATEEN LEMELIN MRN: [id number] Date of Birth: 1956-02-26

## 2015-10-19 LAB — HCV RNA, QUANT REAL-TIME PCR W/REFLEX
HCV RNA, PCR, QN (LOG): 5.22 {Log_IU}/mL — AB
HCV RNA, PCR, QN: 166000 [IU]/mL — AB

## 2015-10-19 LAB — HCV RNA,LIPA RFLX NS5A DRUG RESIST

## 2015-10-21 ENCOUNTER — Telehealth: Payer: Self-pay | Admitting: Pharmacy Technician

## 2015-10-25 ENCOUNTER — Encounter: Payer: Self-pay | Admitting: Family Medicine

## 2015-10-25 ENCOUNTER — Ambulatory Visit: Payer: Self-pay | Attending: Family Medicine | Admitting: Family Medicine

## 2015-10-25 ENCOUNTER — Ambulatory Visit: Payer: Self-pay | Admitting: Physical Therapy

## 2015-10-25 VITALS — BP 138/77 | HR 97 | Temp 98.7°F | Resp 16 | Ht 65.0 in | Wt 111.0 lb

## 2015-10-25 DIAGNOSIS — F17210 Nicotine dependence, cigarettes, uncomplicated: Secondary | ICD-10-CM | POA: Insufficient documentation

## 2015-10-25 DIAGNOSIS — M545 Low back pain, unspecified: Secondary | ICD-10-CM

## 2015-10-25 DIAGNOSIS — Z79899 Other long term (current) drug therapy: Secondary | ICD-10-CM | POA: Insufficient documentation

## 2015-10-25 DIAGNOSIS — Z0000 Encounter for general adult medical examination without abnormal findings: Secondary | ICD-10-CM | POA: Insufficient documentation

## 2015-10-25 DIAGNOSIS — M62830 Muscle spasm of back: Secondary | ICD-10-CM

## 2015-10-25 DIAGNOSIS — R198 Other specified symptoms and signs involving the digestive system and abdomen: Secondary | ICD-10-CM

## 2015-10-25 DIAGNOSIS — Z124 Encounter for screening for malignant neoplasm of cervix: Secondary | ICD-10-CM | POA: Insufficient documentation

## 2015-10-25 DIAGNOSIS — R293 Abnormal posture: Secondary | ICD-10-CM

## 2015-10-25 DIAGNOSIS — R6889 Other general symptoms and signs: Secondary | ICD-10-CM

## 2015-10-25 LAB — HEMOCCULT GUIAC POC 1CARD (OFFICE): Fecal Occult Blood, POC: NEGATIVE

## 2015-10-25 NOTE — Patient Instructions (Signed)
Bridge    Lie back, legs bent. Inhale, pressing hips up. Keeping ribs in, lengthen lower back. Exhale, rolling down along spine from top. Repeat ____ times. Do ____ sessions per day.  http://pm.exer.us/55   Copyright  VHI. All rights reserved.  Hamstring Step 3      Keeping back flat and feet together, rotate knees to left side. Hold __10-15__ seconds. Repeat __5-10__ times per set. Do __1-2__ sets per session. Do __2__ sessions per day.  http://orth.exer.us/123   Copyright  VHI. All rights reserved.   Hamstring Stretch - Supine    Lie on back, uninvolved leg raised toward ceiling, towel around knee. Pull thigh toward chest until a stretch is felt on back of thigh. Hold for _30__ seconds. If possible, move towel up to calf and repeat. Repeat on involved leg. Repeat _2-3__ times. Do 2___ times per day.  Copyright  VHI. All rights reserved.

## 2015-10-25 NOTE — Patient Instructions (Addendum)
Kara Bailey was seen today for gynecologic exam.  Diagnoses and all orders for this visit:  Pap smear for cervical cancer screening -     Cytology - PAP [place] -     Flu Vaccine QUAD 36+ mos IM  Healthcare maintenance -     MM DIGITAL SCREENING BILATERAL; Future   Low out cost optometrist (about $65.00 for office visit)  1. Dr. Thurston Hole Phone # [phone] [address]  [place], [place] [address]   2. [facility]  Phone # [phone] [address],  [address]   F/u in 3 months for HTN  Dr. Adrian Blackwater

## 2015-10-25 NOTE — Progress Notes (Signed)
Subjective:  Patient ID: Kara Bailey, female    DOB: 31-Aug-1956  Age: 59 y.o. MRN: [id number]  CC: Gynecologic Exam   HPI Kara Bailey presents for    1. Pap: no bleeding. Discomfort with intercourse with 36 yo husband. No discharge or pelvic pain.   Social History  Substance Use Topics  . Smoking status: Current Every Day Smoker -- 0.25 packs/day    Types: Cigarettes    Start date: 11/12/1968  . Smokeless tobacco: Never Used     Comment: cutting back  . Alcohol Use: Yes     Comment: beer on weekends    Outpatient Prescriptions Prior to Visit  Medication Sig Dispense Refill  . ALPRAZolam (XANAX) 0.5 MG tablet Take 1 tablet (0.5 mg total) by mouth 3 (three) times daily as needed for anxiety. 30 tablet 0  . metoprolol succinate (TOPROL-XL) 50 MG 24 hr tablet Take 1 tablet (50 mg total) by mouth daily. Take with or immediately following a meal. 30 tablet 5  . naproxen sodium (ANAPROX) 220 MG tablet Take 220 mg by mouth 2 (two) times daily as needed (pain).    . pantoprazole (PROTONIX) 40 MG tablet Take 1 tablet (40 mg total) by mouth daily. 30 tablet 3  . PARoxetine (PAXIL) 20 MG tablet Take 1 tablet (20 mg total) by mouth daily. 30 tablet 2   No facility-administered medications prior to visit.    ROS Review of Systems  Constitutional: Negative for fever and chills.  Eyes: Negative for visual disturbance.  Respiratory: Negative for shortness of breath.   Cardiovascular: Negative for chest pain.  Gastrointestinal: Negative for abdominal pain and blood in stool.  Musculoskeletal: Negative for back pain and arthralgias.  Skin: Negative for rash.  Allergic/Immunologic: Negative for immunocompromised state.  Hematological: Negative for adenopathy. Does not bruise/bleed easily.  Psychiatric/Behavioral: Negative for suicidal ideas and dysphoric mood.    Objective:  BP 138/77 mmHg  Pulse 97  Temp(Src) 98.7 F (37.1 C) (Oral)  Resp 16  Ht 5\' 5"  (1.651 m)  Wt 111  lb (50.349 kg)  BMI 18.47 kg/m2  SpO2 100%  BP/Weight 10/25/2015 09/26/2015 [id number]  Systolic BP 0000000 [id number]  Diastolic BP 77 93 98  Wt. (Lbs) 111 111 111  BMI 18.47 18.47 18.47    Physical Exam  Constitutional: She appears well-developed and well-nourished. No distress.  Pulmonary/Chest: Effort normal and breath sounds normal.  Genitourinary: Vagina normal and uterus normal.    Pelvic exam was performed with patient prone. There is no rash, tenderness or lesion on the right labia. There is no rash, tenderness or lesion on the left labia. Cervix exhibits no motion tenderness, no discharge and no friability.  Musculoskeletal: She exhibits no edema.  Lymphadenopathy:       Right: No inguinal adenopathy present.       Left: No inguinal adenopathy present.  Skin: Skin is warm and dry. No rash noted.    Assessment & Plan:   Problem List Items Addressed This Visit    None    Visit Diagnoses    Pap smear for cervical cancer screening    -  Primary    Relevant Orders    Cytology - PAP [place]    Healthcare maintenance        Relevant Orders    Flu Vaccine QUAD 36+ mos IM (Completed)    MM DIGITAL SCREENING BILATERAL    Hemoccult - 1 Card (office) (Completed)  No orders of the defined types were placed in this encounter.    Follow-up: No Follow-up on file.   Boykin Nearing MD

## 2015-10-25 NOTE — Progress Notes (Signed)
Pap smear  Last pap smear normal results  No vaginal discharge  No pain  Tobacco user 1/2 ppday No suicidal thought in the past two weeks

## 2015-10-25 NOTE — Therapy (Signed)
[place], [place], [address] Phone: [phone]   Fax:  [phone]  Physical Therapy Treatment  Patient Details  Name: Kara Bailey MRN: [id number] Date of Birth: [birth year] Referring Provider: Boykin Nearing MD  Encounter Date: 10/25/2015      PT End of Session - 10/25/15 0929    Visit Number 2   Number of Visits 12   Date for PT Re-Evaluation 11/29/15   PT Start Time [id number]   PT Stop Time 0945   PT Time Calculation (min) 58 min   Activity Tolerance Patient tolerated treatment well;Patient limited by pain   Behavior During Therapy Anxious      Past Medical History  Diagnosis Date  . Hepatitis C   . Shingles     left side  . Hepatitis C virus infection   . Palpitations   . PAC (premature atrial contraction) 10/11/2014  . PVC (premature ventricular contraction) 10/11/2014  . Dyspnea 08/2014  . Chest pain 08/2014    unspecified  . Tachycardia 08/2014  . Dizziness 08/2014  . Weakness 08/2014    Past Surgical History  Procedure Laterality Date  . Appendectomy    . Left heart catheterization with coronary angiogram N/A 10/29/2014    Procedure: LEFT HEART CATHETERIZATION WITH CORONARY ANGIOGRAM;  Surgeon: Burnell Blanks, MD;  Location: [facility] CATH LAB;  Service: Cardiovascular;  Laterality: N/A;    There were no vitals filed for this visit.  Visit Diagnosis:  Back muscle spasm  Abnormal posture  Left-sided low back pain without sciatica  Activity intolerance  Abdominal weakness      Subjective Assessment - 10/25/15 0853    Subjective I am not hurting right now.  I did my stretches and it felt better.  I'm scared of this PT.    Currently in Pain? No/denies  was a 10/10 this am as she washed dishes   Pain Location Back   Pain Orientation Left   Pain Descriptors / Indicators Aching   Pain Type Chronic pain   Pain Onset More than a month ago   Pain Frequency Intermittent    Aggravating Factors  standing and sitting too long    Pain Relieving Factors changing positions.             [facility] PT Assessment - 10/25/15 0001    Strength   Right Hip Flexion 3+/5   Left Hip Flexion 3+/5   Right Knee Flexion 4/5   Right Knee Extension 4+/5   Left Knee Flexion 4/5   Left Knee Extension 4+/5   Right Ankle Dorsiflexion 4-/5   Right Ankle Eversion 4-/5   Left Ankle Dorsiflexion 4-/5   Left Ankle Eversion 4-/5             [facility] Adult PT Treatment/Exercise - 10/25/15 0855    Lumbar Exercises: Stretches   Active Hamstring Stretch 2 reps;30 seconds   Active Hamstring Stretch Limitations HEP   Passive Hamstring Stretch 2 reps;30 seconds   Single Knee to Chest Stretch 2 reps;10 seconds   Lower Trunk Rotation Limitations x 10   HEP   Pelvic Tilt 10 seconds   Pelvic Tilt Limitations x 5    Quadruped Mid Back Stretch 2 reps;30 seconds   Lumbar Exercises: Supine   Bridge 10 reps   Bridge Limitations HEP    Moist Heat Therapy   Number Minutes Moist Heat 15 Minutes   Moist Heat Location Lumbar Spine  supine   Electrical Stimulation  Electrical Stimulation Location low back    Electrical Stimulation Action IFC   Electrical Stimulation Parameters to tol   Electrical Stimulation Goals Pain     NuStep for 5 min level 1 UE and LE            PT Education - 10/25/15 0933    Education provided Yes   Education Details HEP for strengthening   Person(s) Educated Patient   Methods Explanation   Comprehension Verbalized understanding;Returned demonstration;Need further instruction;Verbal cues required;Tactile cues required          PT Short Term Goals - 10/18/15 1242    PT SHORT TERM GOAL #1   Title "Independent with initial HEP   Time 3   Period Weeks   Status New   PT SHORT TERM GOAL #2   Title "Report pain decrease from 10/10 to5 /10   Time 3   Period Weeks   Status New   PT SHORT TERM GOAL #3   Title "Demonstrate understanding of  proper sitting posture and be more conscious of position and posture throughout the day.    Time 3   Period Weeks   Status New   PT SHORT TERM GOAL #4   Title Pt will demonstrate proper body mechanics for utilizing broom and vacuum   Time 3   Period Weeks   Status New           PT Long Term Goals - 10/18/15 1243    PT LONG TERM GOAL #1   Title "Demonstrate and verbalize techniques to reduce the risk of re-injury including: lifting, posture, body mechanics.    Time 6   Period Weeks   Status New   PT LONG TERM GOAL #2   Title "Pt will be independent with advanced HEP   Time 6   Period Weeks   Status New   PT LONG TERM GOAL #3   Title "Pain will decrease to 3/10 or less with all functional activities   Time 6   Period Weeks   Status New   PT LONG TERM GOAL #4   Title "Pt will not wake due to pain while turning in bed while sleeping at night   Time 6   Period Weeks   Status New   PT LONG TERM GOAL #5   Title "FOTO will improve from 74%limitation   to 52%limtation     indicating improved functional mobility.    Time 6   Period Weeks   Status New   PT LONG TERM GOAL #6   Title Pt will be able to grocery shop for 1 hour without dependence of utilizing grocery cart for UE support   Time 6   Period Weeks   Status New   PT LONG TERM GOAL #7   Title Pt will be able to  stand for  2 hours in order to return to work/ normal activities  as able   Time [place] - 10/25/15 0951    Clinical Impression Statement Pt has been doing exercises at home and reports relief of pain.  She had extreme anxiety about today's treatment.  Was able to give gentle strength for hips, tol MMT tetsting.  Overall weak but symmetrical.  Noted abnormal gait with toeing in, and circumducttion of  LLE., lateral shifting that patient appears to be inc control of.  She responds  well to IFC and heat.  Does not use pain scale accurately.    PT Next Visit Plan  progress to lower ab and core strength,  modalities as needed, gait    Consulted and Agree with Plan of Care Patient        Problem List Patient Active Problem List   Diagnosis Date Noted  . HTN (hypertension) 09/26/2015  . Chest pain 10/14/2014  . PAC (premature atrial contraction) 10/11/2014  . PVC (premature ventricular contraction) 10/11/2014  . Back muscle spasm 06/15/2014  . Anxiety state, unspecified 06/15/2014  . Smoking 06/15/2014  . 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 Chronic hepatitis C without hepatic coma ([place]) 05/28/2013  . Cirrhosis of liver ([place]) 05/20/2013  . Acute hepatic failure 05/07/2013  . Jaundice 05/04/2013  . Transaminitis 05/04/2013  . ARF (acute renal failure) ([place]) 05/04/2013    Dylana Shaw 10/25/2015, 1:19 PM  [facility] [address] [place], [place], [address] Phone: [phone]   Fax:  [phone]  Name: Kara Bailey MRN: [id number] Date of Birth: Mar 24, 1956  Raeford Razor, PT 10/25/2015 1:20 PM Phone: [phone] Fax: [phone]

## 2015-10-26 ENCOUNTER — Ambulatory Visit (INDEPENDENT_AMBULATORY_CARE_PROVIDER_SITE_OTHER): Payer: Self-pay | Admitting: Internal Medicine

## 2015-10-26 ENCOUNTER — Ambulatory Visit (HOSPITAL_COMMUNITY)
Admission: RE | Admit: 2015-10-26 | Discharge: 2015-10-26 | Disposition: A | Discharge status: Home / Self Care | Payer: Self-pay | Source: Ambulatory Visit | Attending: Family Medicine | Admitting: Family Medicine

## 2015-10-26 ENCOUNTER — Ambulatory Visit: Payer: Self-pay | Admitting: Physical Therapy

## 2015-10-26 ENCOUNTER — Encounter: Payer: Self-pay | Admitting: Internal Medicine

## 2015-10-26 VITALS — BP 159/93 | HR 93 | Temp 98.4°F | Ht 66.0 in | Wt 111.0 lb

## 2015-10-26 DIAGNOSIS — F411 Generalized anxiety disorder: Secondary | ICD-10-CM

## 2015-10-26 DIAGNOSIS — G8929 Other chronic pain: Secondary | ICD-10-CM | POA: Insufficient documentation

## 2015-10-26 DIAGNOSIS — R293 Abnormal posture: Secondary | ICD-10-CM

## 2015-10-26 DIAGNOSIS — R198 Other specified symptoms and signs involving the digestive system and abdomen: Secondary | ICD-10-CM

## 2015-10-26 DIAGNOSIS — M545 Low back pain, unspecified: Secondary | ICD-10-CM

## 2015-10-26 DIAGNOSIS — M62830 Muscle spasm of back: Secondary | ICD-10-CM | POA: Insufficient documentation

## 2015-10-26 DIAGNOSIS — Z23 Encounter for immunization: Secondary | ICD-10-CM

## 2015-10-26 DIAGNOSIS — R740 Nonspecific elevation of levels of transaminase and lactic acid dehydrogenase [LDH]: Secondary | ICD-10-CM

## 2015-10-26 DIAGNOSIS — K7460 Unspecified cirrhosis of liver: Secondary | ICD-10-CM

## 2015-10-26 DIAGNOSIS — B182 Chronic viral hepatitis C: Secondary | ICD-10-CM

## 2015-10-26 DIAGNOSIS — R6889 Other general symptoms and signs: Secondary | ICD-10-CM

## 2015-10-26 DIAGNOSIS — F1010 Alcohol abuse, uncomplicated: Secondary | ICD-10-CM | POA: Insufficient documentation

## 2015-10-26 DIAGNOSIS — M4806 Spinal stenosis, lumbar region: Secondary | ICD-10-CM | POA: Insufficient documentation

## 2015-10-26 DIAGNOSIS — R7401 Elevation of levels of liver transaminase levels: Secondary | ICD-10-CM

## 2015-10-26 MED ORDER — ELBASVIR-GRAZOPREVIR 50-100 MG PO TABS: 1.0000 | ORAL_TABLET | Freq: Every day | ORAL | Status: DC

## 2015-10-26 NOTE — Addendum Note (Signed)
Addended by: Myrtis Hopping A on: 10/26/2015 11:20 AM   Modules accepted: Orders

## 2015-10-26 NOTE — Assessment & Plan Note (Signed)
I encouraged complete cessation

## 2015-10-26 NOTE — Assessment & Plan Note (Signed)
Likely from both alcohol and hepatitis C.  Encouraged complete cessation

## 2015-10-26 NOTE — Patient Instructions (Addendum)
Date 10/26/2015  Dear Ms Kara Bailey, As discussed in the [facility], your hepatitis C therapy will include the following medications:          Zepatier (elbasvir 50 mg/grazoprevir 100 mg) for 12 weeks   Please note that ALL MEDICATIONS WILL START ON THE SAME DATE for a total of 12 weeks. ---------------------------------------------------------------- Your HCV Treatment Start Date: TBA   Your HCV genotype:  1b    Liver Fibrosis: TBD    ---------------------------------------------------------------- YOUR PHARMACY CONTACT:   [place] [facility] [employer] Phone: [phone] Hours: Monday to Friday 7:30 am to 6:00 pm   Please always contact your pharmacy at least 3-4 business days before you run out of medications to ensure your next month's medication is ready or 1 week prior to running out if you receive it by mail.  Remember, each prescription is for 28 days. ---------------------------------------------------------------- GENERAL NOTES REGARDING YOUR HEPATITIS C MEDICATION:  ZEPATIER is available as a beige-colored, oval-shaped, film-coated tablet debossed with "770" on one side and plain on the other. Each tablet contains 50 mg elbasvir and 100 mg grazoprevir.  Common side effects of ZEPATIER when used without ribavirin include: - feeling tired -trouble sleeping - headache -diarrhea - nausea  Common side effects of ZEPATIER when used with ribavirin include: - low red blood cell counts (anemia) - feeling irritable - headache - stomach pain - feeling tired - depression - shortness of breath - joint pain - rash or itching  Please note that this only lists the most common side effects and is NOT a comprehensive list of the potential side effects of these medications. For more information, please review the drug information sheets that come with your medication package from the pharmacy.   ---------------------------------------------------------------- GENERAL HELPFUL HINTS ON HCV THERAPY: 1. Stay well-hydrated. 2. Notify the [facility] of any changes in your other over-the-counter/herbal or prescription medications. 3. If you miss a dose of your medication, take the missed dose as soon as you remember. Return to your regular time/dose schedule the next day.  4.  Do not stop taking your medications without first talking with your healthcare provider. 5.  You may take Tylenol (acetaminophen), as long as the dose is less than 2000 mg (OR no more than 4 tablets of the Tylenol Extra Strengths 500mg  tablet) in 24 hours. 6.  You will see our pharmacist-specialist within the first 2 weeks of starting your medication. 7.  You will need to obtain routine labs around week 4 and12 weeks after starting and then 3 to 6 months after finishing Zepatier.   8.  If ribavirin is part of your regimen, you also will have a lab visit within 2 weeks.   Kara Bailey, [facility] [place], [place]  [phone]

## 2015-10-26 NOTE — Progress Notes (Signed)
   Subjective:    Patient ID: Kara Bailey, female    DOB: 11/13/1955, 59 y.o.   MRN: [id number]  HPI Here for follow up of HCV.   Seen initially last year and in follow up in January 2016 but did not get elastography or return after that.  Here now for reevaluation.  Has genotype 1b, previous ultrasound with concerns for cirrhosis, viral load of 369,398.  Started hepatitis A series.  On ppi.  Has had persistent transaminitis.  Still drinking but is cutting down again.  Asymptomatic otherwise     Review of Systems  Constitutional: Negative for fatigue.  Gastrointestinal: Negative for nausea and diarrhea.  Musculoskeletal: Negative for myalgias and arthralgias.  Skin: Negative for rash.  Neurological: Negative for dizziness and light-headedness.       Objective:   Physical Exam  Constitutional: She appears well-developed and well-nourished. No distress.  HENT:  Mouth/Throat: No oropharyngeal exudate.  Eyes: No scleral icterus.  Cardiovascular: Normal rate, regular rhythm and normal heart sounds.   No murmur heard. Pulmonary/Chest: Effort normal and breath sounds normal. No respiratory distress. She has no wheezes.  Lymphadenopathy:    She has no cervical adenopathy.  Skin: No rash noted.     Social History   Social History  . Marital Status: Divorced    Spouse Name: N/A  . Number of Children: N/A  . Years of Education: N/A   Occupational History  . Not on file.   Social History Main Topics  . Smoking status: Current Every Day Smoker -- 0.25 packs/day    Types: Cigarettes    Start date: 11/12/1968  . Smokeless tobacco: Never Used     Comment: cutting back  . Alcohol Use: 0.0 oz/week    0 Standard drinks or equivalent per week     Comment: beer on weekends  . Drug Use: No  . Sexual Activity: Not on file   Other Topics Concern  . Not on file   Social History Narrative       Assessment & Plan:

## 2015-10-26 NOTE — Assessment & Plan Note (Signed)
If confirmed on elastography, will send to GI.

## 2015-10-26 NOTE — Progress Notes (Signed)
Patient ID: Kara Bailey, female   DOB: 1956-10-01, 59 y.o.   MRN: [id number] HPI: Kara Bailey is a 58 y.o. female who is here for her hep C visit.   Lab Results  Component Value Date   HCVGENOTYPE 1b 09/27/2014    Allergies: No Known Allergies  Vitals: Temp: 98.4 F (36.9 C) (12/14 0901) Temp Source: Oral (12/14 0901) BP: 159/93 mmHg (12/14 0901) Pulse Rate: 93 (12/14 0901)  Past Medical History: Past Medical History  Diagnosis Date  . Hepatitis C   . Shingles     left side  . Hepatitis C virus infection   . Palpitations   . PAC (premature atrial contraction) 10/11/2014  . PVC (premature ventricular contraction) 10/11/2014  . Dyspnea 08/2014  . Chest pain 08/2014    unspecified  . Tachycardia 08/2014  . Dizziness 08/2014  . Weakness 08/2014    Social History: Social History   Social History  . Marital Status: Divorced    Spouse Name: N/A  . Number of Children: N/A  . Years of Education: N/A   Social History Main Topics  . Smoking status: Current Every Day Smoker -- 0.25 packs/day    Types: Cigarettes    Start date: 11/12/1968  . Smokeless tobacco: Never Used     Comment: cutting back  . Alcohol Use: 0.0 oz/week    0 Standard drinks or equivalent per week     Comment: beer on weekends  . Drug Use: No  . Sexual Activity: Not Asked   Other Topics Concern  . None   Social History Narrative    Labs: HEP B S AB (no units)  Date Value  10/14/2015 POS*   HEPATITIS B SURFACE AG (no units)  Date Value  10/14/2015 NEGATIVE   HCV AB (no units)  Date Value  05/04/2013 Reactive*    Lab Results  Component Value Date   HCVGENOTYPE 1b 09/27/2014    Hepatitis C RNA quantitative Latest Ref Rng 09/27/2014 05/10/2013  HCV Quantitative <15 IU/mL 369398(H) 37063(H)  HCV Quantitative Log <1.18 log 10 5.57(H) 4.57(H)    AST (U/L)  Date Value  09/26/2015 70*  09/15/2014 53*  09/08/2014 63*   ALT (U/L)  Date Value  09/26/2015 67*  09/15/2014  60*  09/08/2014 63*   INR  Date Value  10/14/2015 1.01  10/22/2014 1.0 ratio  09/27/2014 1.03    CrCl: CrCl cannot be calculated (Patient has no serum creatinine result on file.).  Fibrosis Score: ARFI in 1/17   Child-Pugh Score: Class A  Previous Treatment Regimen: Naive  Assessment: 59 yo who was seen earlier this year for her hep C but couldn't get treatment. She hasn't had a elastography yet but it's schedule for 1/17. She has 1b. We are going to use [facility] to get Zepatier for her. She has signed the app. She currently has no job or much of income. Most of the income is through support of family members.   Recommendations:  Zepatier 1 PO qday when approved x 3 months Will submit [employer] today F/u with pharmacist after 2 wks of starting  Onnie Boer [place], [place].D., BCPS, AAHIVP Clinical [facility] 10/26/2015, 9:30 AM

## 2015-10-26 NOTE — Therapy (Signed)
[place], [place], [address] Phone: [phone]   Fax:  [phone]  Physical Therapy Treatment  Patient Details  Name: Kara Bailey MRN: [id number] Date of Birth: May 13, 1956 Referring Provider: Boykin Nearing MD  Encounter Date: 10/26/2015      PT End of Session - 10/26/15 1337    Visit Number 3   Number of Visits 12   Date for PT Re-Evaluation 11/29/15   PT Start Time 6962   PT Stop Time 1340   PT Time Calculation (min) 35 min   Activity Tolerance Patient tolerated treatment well;Patient limited by pain   Behavior During Therapy [facility] for tasks assessed/performed      Past Medical History  Diagnosis Date  . Hepatitis C   . Shingles     left side  . Hepatitis C virus infection   . Palpitations   . PAC (premature atrial contraction) 10/11/2014  . PVC (premature ventricular contraction) 10/11/2014  . Dyspnea 08/2014  . Chest pain 08/2014    unspecified  . Tachycardia 08/2014  . Dizziness 08/2014  . Weakness 08/2014    Past Surgical History  Procedure Laterality Date  . Appendectomy    . Left heart catheterization with coronary angiogram N/A 10/29/2014    Procedure: LEFT HEART CATHETERIZATION WITH CORONARY ANGIOGRAM;  Surgeon: Burnell Blanks, MD;  Location: [facility] CATH LAB;  Service: Cardiovascular;  Laterality: N/A;    There were no vitals filed for this visit.  Visit Diagnosis:  Back muscle spasm  Abnormal posture  Left-sided low back pain without sciatica  Activity intolerance  Abdominal weakness      Subjective Assessment - 10/26/15 1311    Subjective I cannot use my arms today, I am sore for getting my shots in each shoudler.  Back is sore 5/10. WIll get an XR today after this appt.    Currently in Pain? Yes   Pain Score 5    Pain Location Back   Pain Orientation Left   Pain Descriptors / Indicators Sore   Pain Type Chronic pain   Pain Onset More than a month  ago   Pain Frequency Intermittent           [facility] Adult PT Treatment/Exercise - 10/26/15 1315    Lumbar Exercises: Stretches   Active Hamstring Stretch 2 reps;30 seconds   Active Hamstring Stretch Limitations seated    Single Knee to Chest Stretch 2 reps;30 seconds   Lower Trunk Rotation Limitations x 10   HEP   Lumbar Exercises: Supine   Ab Set 5 reps   Bridge 10 reps   Bridge Limitations HEP    Straight Leg Raise 10 reps   Knee/Hip Exercises: Standing   Heel Raises Both;1 set;10 reps   Other Standing Knee Exercises Sit to stand x 10 no UE support    Knee/Hip Exercises: Seated   Long Arc Quad Strengthening;Both;1 set;10 reps   Long Arc Quad Weight 5 lbs.   Long Arc Quad Limitations strain in back                 PT Education - 10/26/15 1337    Education provided Yes   Education Details abdominal contraction   Person(s) Educated Patient   Methods Explanation   Comprehension Verbalized understanding;Returned demonstration          PT Short Term Goals - 10/18/15 1242    PT SHORT TERM GOAL #1   Title "Independent with initial HEP  Time 3   Period Weeks   Status New   PT SHORT TERM GOAL #2   Title "Report pain decrease from 10/10 to5 /10   Time 3   Period Weeks   Status New   PT SHORT TERM GOAL #3   Title "Demonstrate understanding of proper sitting posture and be more conscious of position and posture throughout the day.    Time 3   Period Weeks   Status New   PT SHORT TERM GOAL #4   Title Pt will demonstrate proper body mechanics for utilizing broom and vacuum   Time 3   Period Weeks   Status New           PT Long Term Goals - 10/18/15 1243    PT LONG TERM GOAL #1   Title "Demonstrate and verbalize techniques to reduce the risk of re-injury including: lifting, posture, body mechanics.    Time 6   Period Weeks   Status New   PT LONG TERM GOAL #2   Title "Pt will be independent with advanced HEP   Time 6   Period Weeks   Status New    PT LONG TERM GOAL #3   Title "Pain will decrease to 3/10 or less with all functional activities   Time 6   Period Weeks   Status New   PT LONG TERM GOAL #4   Title "Pt will not wake due to pain while turning in bed while sleeping at night   Time 6   Period Weeks   Status New   PT LONG TERM GOAL #5   Title "FOTO will improve from 74%limitation   to 52%limtation     indicating improved functional mobility.    Time 6   Period Weeks   Status New   PT LONG TERM GOAL #6   Title Pt will be able to grocery shop for 1 hour without dependence of utilizing grocery cart for UE support   Time 6   Period Weeks   Status New   PT LONG TERM GOAL #7   Title Pt will be able to  stand for  2 hours in order to return to work/ normal activities  as able   Time 6   Period Weeks   Status New               Plan - 10/26/15 1340    Clinical Impression Statement Pt did well today despite UE soreness.  She has significant weakness and poor muscle endurance in LEs.  No goals met.     PT Next Visit Plan progress to lower ab and core strength,  modalities as needed, gait    PT Home Exercise Plan Prayer stretch and quadratus lumborum  right sidelying   Consulted and Agree with Plan of Care Patient        Problem List Patient Active Problem List   Diagnosis Date Noted  . Alcohol abuse 10/26/2015  . HTN (hypertension) 09/26/2015  . Chest pain 10/14/2014  . PAC (premature atrial contraction) 10/11/2014  . PVC (premature ventricular contraction) 10/11/2014  . Back muscle spasm 06/15/2014  . Generalized anxiety disorder 06/15/2014  . Smoking 06/15/2014  . 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 Chronic hepatitis C without hepatic coma ([place]) 05/28/2013  . Cirrhosis of liver ([place]) 05/20/2013  . Jaundice 05/04/2013  . Transaminitis 05/04/2013    PAA,JENNIFER 10/26/2015, 1:42 PM  [place], [place],  [address] Phone: [phone]   Fax:  [phone]  Name: CHERYLIN WAGUESPACK MRN: [id number] Date of Birth: May 24, 1956   Raeford Razor, PT 10/26/2015 1:42 PM Phone: [phone] Fax: [phone]

## 2015-10-26 NOTE — Assessment & Plan Note (Signed)
Will try to get her on Zepatier

## 2015-10-26 NOTE — Assessment & Plan Note (Signed)
Had some issues with this but now controlled on medicaiton

## 2015-10-27 LAB — CYTOLOGY - PAP

## 2015-10-27 LAB — CERVICOVAGINAL ANCILLARY ONLY
CHLAMYDIA, DNA PROBE: NEGATIVE
NEISSERIA GONORRHEA: NEGATIVE
TRICH (WINDOWPATH): NEGATIVE
WET PREP (BD AFFIRM): NEGATIVE

## 2015-10-28 ENCOUNTER — Encounter: Payer: Self-pay | Admitting: Pharmacy Technician

## 2015-10-31 ENCOUNTER — Ambulatory Visit: Payer: Self-pay | Admitting: Physical Therapy

## 2015-11-02 ENCOUNTER — Ambulatory Visit: Payer: Self-pay | Admitting: Physical Therapy

## 2015-11-02 DIAGNOSIS — R293 Abnormal posture: Secondary | ICD-10-CM

## 2015-11-02 DIAGNOSIS — M62830 Muscle spasm of back: Secondary | ICD-10-CM

## 2015-11-02 DIAGNOSIS — R6889 Other general symptoms and signs: Secondary | ICD-10-CM

## 2015-11-02 DIAGNOSIS — M545 Low back pain, unspecified: Secondary | ICD-10-CM

## 2015-11-02 DIAGNOSIS — R198 Other specified symptoms and signs involving the digestive system and abdomen: Secondary | ICD-10-CM

## 2015-11-02 NOTE — Therapy (Signed)
[place], [place], [address] Phone: [phone]   Fax:  [phone]  Physical Therapy Treatment  Patient Details  Name: Kara Bailey MRN: [id number] Date of Birth: June 24, 1956 Referring Provider: Boykin Nearing MD  Encounter Date: 11/02/2015      PT End of Session - 11/02/15 1112    Visit Number 4   Number of Visits 12   Date for PT Re-Evaluation 11/29/15   PT Start Time 1106   PT Stop Time 1157   PT Time Calculation (min) 51 min   Activity Tolerance Patient tolerated treatment well   Behavior During Therapy [facility] for tasks assessed/performed      Past Medical History  Diagnosis Date  . Hepatitis C   . Shingles     left side  . Hepatitis C virus infection   . Palpitations   . PAC (premature atrial contraction) 10/11/2014  . PVC (premature ventricular contraction) 10/11/2014  . Dyspnea 08/2014  . Chest pain 08/2014    unspecified  . Tachycardia 08/2014  . Dizziness 08/2014  . Weakness 08/2014    Past Surgical History  Procedure Laterality Date  . Appendectomy    . Left heart catheterization with coronary angiogram N/A 10/29/2014    Procedure: LEFT HEART CATHETERIZATION WITH CORONARY ANGIOGRAM;  Surgeon: Burnell Blanks, MD;  Location: [facility] CATH LAB;  Service: Cardiovascular;  Laterality: N/A;    There were no vitals filed for this visit.  Visit Diagnosis:  Back muscle spasm  Abnormal posture  Left-sided low back pain without sciatica  Activity intolerance  Abdominal weakness      Subjective Assessment - 11/02/15 1107    Subjective I did my ex this AM, swept a little today, did a little and pain icnreased.  Ex helped.  2/10 Back pain now.    Currently in Pain? Yes   Pain Score 2    Pain Location Back   Pain Orientation Lower   Pain Type Chronic pain   Pain Onset More than a month ago   Pain Frequency Intermittent   Aggravating Factors  activity standing and  sitting   Multiple Pain Sites No                         [facility] Adult PT Treatment/Exercise - 11/02/15 1113    Lumbar Exercises: Stretches   Lower Trunk Rotation Limitations x 10   HEP   Lumbar Exercises: Aerobic   Stationary Bike NuStep level 6, 5 min UE and LE    Lumbar Exercises: Supine   Clam 20 reps   Bridge 10 reps   Straight Leg Raise 10 reps   Lumbar Exercises: Sidelying   Clam 20 reps   Clam Limitations L side only    Other Sidelying Lumbar Exercises stretch to elongate L side    Knee/Hip Exercises: Standing   Heel Raises Both;1 set;20 reps   Hip Flexion Stengthening;Both;1 set;20 reps   Hip Abduction Stengthening;Both;1 set;20 reps;Knee straight   Lateral Step Up Both;1 set;10 reps;Hand Hold: 1   Functional Squat 1 set;10 reps   Knee/Hip Exercises: Seated   Long Arc Quad Strengthening;Both;1 set;10 reps   Long Arc Quad Weight 5 lbs.   Moist Heat Therapy   Number Minutes Moist Heat 15 Minutes   Moist Heat Location Lumbar Spine  L lumbar                 PT Education - 11/02/15 1243  Education provided Yes   Education Details Standing ther ex   Person(s) Educated Patient   Methods Explanation;Demonstration   Comprehension Verbalized understanding;Returned demonstration;Need further instruction          PT Short Term Goals - 11/02/15 1248    PT SHORT TERM GOAL #1   Title "Independent with initial HEP   Status On-going   PT SHORT TERM GOAL #2   Title "Report pain decrease from 10/10 to 5 /10 with ADLs and light housework   Status Revised   PT SHORT TERM GOAL #3   Title "Demonstrate understanding of proper sitting posture and be more conscious of position and posture throughout the day.    Status On-going   PT SHORT TERM GOAL #4   Title Pt will demonstrate proper body mechanics for utilizing broom and vacuum   Status On-going           PT Long Term Goals - 11/02/15 1248    PT LONG TERM GOAL #1   Title "Demonstrate and  verbalize techniques to reduce the risk of re-injury including: lifting, posture, body mechanics.    Status On-going   PT LONG TERM GOAL #2   Title "Pt will be independent with advanced HEP   Status On-going   PT LONG TERM GOAL #3   Title "Pain will decrease to 3/10 or less with all functional activities   Status On-going   PT LONG TERM GOAL #4   Title "Pt will not wake due to pain while turning in bed while sleeping at night   Status On-going   PT LONG TERM GOAL #5   Title "FOTO will improve from 74%limitation   to 52%limtation     indicating improved functional mobility.    Status On-going   PT LONG TERM GOAL #6   Title Pt will be able to grocery shop for 1 hour without dependence of utilizing grocery cart for UE support   Status On-going   PT LONG TERM GOAL #7   Title Pt will be able to  stand for  2 hours in order to return to work/ normal activities  as able   Status On-going               Plan - 11/02/15 1244    Clinical Impression Statement Pt had a min increase in pain in back with standing ther ex.  She does not tolerate manual to L lumbar area, spent time in sidelying to elongate L side with pillows and offered MHP   PT Next Visit Plan progress to lower ab and core strength,  modalities as needed, strength, try manual (gentle) next   PT Home Exercise Plan Prayer stretch and quadratus lumborum  right sidelying, bridging, SLR   Consulted and Agree with Plan of Care Patient        Problem List Patient Active Problem List   Diagnosis Date Noted  . Alcohol abuse 10/26/2015  . HTN (hypertension) 09/26/2015  . Chest pain 10/14/2014  . PAC (premature atrial contraction) 10/11/2014  . PVC (premature ventricular contraction) 10/11/2014  . Back muscle spasm 06/15/2014  . Generalized anxiety disorder 06/15/2014  . Smoking 06/15/2014  . 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 Chronic hepatitis C without hepatic coma ([place]) 05/28/2013  . Cirrhosis of  liver ([place]) 05/20/2013  . Jaundice 05/04/2013  . Transaminitis 05/04/2013    PAA,JENNIFER 11/02/2015, 12:49 PM  [facility] [address] [place], [place], [address] Phone: [phone]  Fax:  [phone]  Name: Kara Bailey MRN: [id number] Date of Birth: 08/12/1956   Raeford Razor, PT 11/02/2015 12:50 PM Phone: [phone] Fax: [phone]

## 2015-11-04 ENCOUNTER — Ambulatory Visit: Payer: Self-pay | Admitting: Physical Therapy

## 2015-11-08 ENCOUNTER — Ambulatory Visit: Payer: Self-pay | Admitting: Physical Therapy

## 2015-11-09 ENCOUNTER — Telehealth: Payer: Self-pay | Admitting: *Deleted

## 2015-11-09 NOTE — Telephone Encounter (Signed)
LVM to return call.

## 2015-11-09 NOTE — Telephone Encounter (Signed)
-----   Message from Boykin Nearing, MD sent at 10/27/2015 11:50 AM EST ----- Negative wet prep

## 2015-11-09 NOTE — Telephone Encounter (Signed)
-----   Message from Boykin Nearing, MD sent at 10/28/2015  8:46 AM EST ----- GC, chlam, trich negative Normal pap smear with HPV negative Patient is low risk, recommended pap interval 5 years she will be 64 at that time and past teh age to stop paps. This can safely be her last pap.  However, if she develops vaginal bleeding,  pelvic pain, or any vaginal concerns pelvic exam is still recommended.

## 2015-11-11 ENCOUNTER — Ambulatory Visit: Payer: Self-pay | Admitting: Physical Therapy

## 2015-11-11 DIAGNOSIS — R198 Other specified symptoms and signs involving the digestive system and abdomen: Secondary | ICD-10-CM

## 2015-11-11 DIAGNOSIS — M545 Low back pain, unspecified: Secondary | ICD-10-CM

## 2015-11-11 DIAGNOSIS — R293 Abnormal posture: Secondary | ICD-10-CM

## 2015-11-11 DIAGNOSIS — R6889 Other general symptoms and signs: Secondary | ICD-10-CM

## 2015-11-11 DIAGNOSIS — M62830 Muscle spasm of back: Secondary | ICD-10-CM

## 2015-11-11 NOTE — Therapy (Signed)
[place], [place], [address] Phone: [phone]   Fax:  [phone]  Physical Therapy Treatment  Patient Details  Name: Kara Bailey MRN: [id number] Date of Birth: [birth year] Referring Provider: Boykin Nearing MD  Encounter Date: 11/11/2015      PT End of Session - 11/11/15 1144    Visit Number 4   Number of Visits 12   Date for PT Re-Evaluation 11/29/15   Authorization Type 100 % financial aid   PT Start Time 1102   PT Stop Time 1140   PT Time Calculation (min) 38 min      Past Medical History  Diagnosis Date  . Hepatitis C   . Shingles     left side  . Hepatitis C virus infection   . Palpitations   . PAC (premature atrial contraction) 10/11/2014  . PVC (premature ventricular contraction) 10/11/2014  . Dyspnea 08/2014  . Chest pain 08/2014    unspecified  . Tachycardia 08/2014  . Dizziness 08/2014  . Weakness 08/2014    Past Surgical History  Procedure Laterality Date  . Appendectomy    . Left heart catheterization with coronary angiogram N/A 10/29/2014    Procedure: LEFT HEART CATHETERIZATION WITH CORONARY ANGIOGRAM;  Surgeon: Burnell Blanks, MD;  Location: [facility] CATH LAB;  Service: Cardiovascular;  Laterality: N/A;    There were no vitals filed for this visit.  Visit Diagnosis:  Back muscle spasm  Abnormal posture  Left-sided low back pain without sciatica  Activity intolerance  Abdominal weakness      Subjective Assessment - 11/11/15 1109    Subjective The childs pose stretch helps the most.   Currently in Pain? No/denies   Aggravating Factors  standing and sitting   Pain Relieving Factors changing positions            [facility] PT Assessment - 11/11/15 0001    Strength   Right/Left Hip Right;Left   Right Hip Extension 4-/5   Right Hip ABduction 4+/5   Left Hip Extension 4+/5   Left Hip ABduction 3-/5  unable to lift more than half ROM   Left Hip  ADduction 3-/5                     [facility] Adult PT Treatment/Exercise - 11/11/15 1116    Lumbar Exercises: Aerobic   Stationary Bike NuStep level 6, 6 min UE and LE    Lumbar Exercises: Supine   Ab Set 5 reps   Clam 20 reps   Clam Limitations bilateral then unilateral   Heel Slides 10 reps  demonstrates left hip stability weakness   Heel Slides Limitations alternating with ab set- cues to level hips   Bridge 10 reps   Bridge Limitations with clam each time   Straight Leg Raise 10 reps   Straight Leg Raises Limitations with ab set- able to unable to perfrom full ROM on left    Lumbar Exercises: Sidelying   Clam 20 reps   Clam Limitations L side only      Ball squeeze hooklying 5 sec x 10           PT Education - 11/11/15 1142    Education provided Yes   Education Details ball squeeze and bridge with clam   Person(s) Educated Patient   Methods Explanation;Handout   Comprehension Verbalized understanding          PT Short Term Goals - 11/02/15 1248  PT SHORT TERM GOAL #1   Title "Independent with initial HEP   Status On-going   PT SHORT TERM GOAL #2   Title "Report pain decrease from 10/10 to 5 /10 with ADLs and light housework   Status Revised   PT SHORT TERM GOAL #3   Title "Demonstrate understanding of proper sitting posture and be more conscious of position and posture throughout the day.    Status On-going   PT SHORT TERM GOAL #4   Title Pt will demonstrate proper body mechanics for utilizing broom and vacuum   Status On-going           PT Long Term Goals - 11/02/15 1248    PT LONG TERM GOAL #1   Title "Demonstrate and verbalize techniques to reduce the risk of re-injury including: lifting, posture, body mechanics.    Status On-going   PT LONG TERM GOAL #2   Title "Pt will be independent with advanced HEP   Status On-going   PT LONG TERM GOAL #3   Title "Pain will decrease to 3/10 or less with all functional activities   Status  On-going   PT LONG TERM GOAL #4   Title "Pt will not wake due to pain while turning in bed while sleeping at night   Status On-going   PT LONG TERM GOAL #5   Title "FOTO will improve from 74%limitation   to 52%limtation     indicating improved functional mobility.    Status On-going   PT LONG TERM GOAL #6   Title Pt will be able to grocery shop for 1 hour without dependence of utilizing grocery cart for UE support   Status On-going   PT LONG TERM GOAL #7   Title Pt will be able to  stand for  2 hours in order to return to work/ normal activities  as able   Status On-going               Plan - 11/11/15 1110    Clinical Impression Statement Pt reports improved tolerance to sitting. Able to sit 20 minutes before needing to get up and stretch. She reports the stretches/exercises are helpful when she has pain. Instructed pt in progressive lower abdominal strengthening as well as hip strength/stability exercises. Pt demonstrates hip weakness L>R. No increased pain today with therex.    PT Next Visit Plan progress to lower ab and core strength, hip strength/stability- review new ,  modalities as needed,         Problem List Patient Active Problem List   Diagnosis Date Noted  . Alcohol abuse 10/26/2015  . HTN (hypertension) 09/26/2015  . Chest pain 10/14/2014  . PAC (premature atrial contraction) 10/11/2014  . PVC (premature ventricular contraction) 10/11/2014  . Back muscle spasm 06/15/2014  . Generalized anxiety disorder 06/15/2014  . Smoking 06/15/2014  . 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 Chronic hepatitis C without hepatic coma ([place]) 05/28/2013  . Cirrhosis of liver ([place]) 05/20/2013  . Jaundice 05/04/2013  . Transaminitis 05/04/2013    Dorene Ar, PTA 11/11/2015, 11:45 AM  [place], [place], [address] Phone: [phone]   Fax:  [phone]  Name: Kara Bailey MRN: [id number] Date of Birth: 11/25/1955

## 2015-11-11 NOTE — Patient Instructions (Addendum)
   Abductor Strength: Bridge Pose (Strap)  NO STRAP YET-just open and close knees Make strap wide enough to brace knees at hip width. Press into strap with knees. Hold for ___1_ breaths. Repeat _10___ times.  Copyright  VHI. All rights reserved.   Squeeze ball or pillow/towel between knees. Hold 5 sec and repeat 10 times

## 2015-11-15 ENCOUNTER — Telehealth: Payer: Self-pay | Admitting: Family Medicine

## 2015-11-15 ENCOUNTER — Ambulatory Visit: Payer: Self-pay | Attending: Family Medicine | Admitting: Physical Therapy

## 2015-11-15 DIAGNOSIS — R293 Abnormal posture: Secondary | ICD-10-CM | POA: Insufficient documentation

## 2015-11-15 DIAGNOSIS — M545 Low back pain, unspecified: Secondary | ICD-10-CM

## 2015-11-15 DIAGNOSIS — F411 Generalized anxiety disorder: Secondary | ICD-10-CM

## 2015-11-15 DIAGNOSIS — R198 Other specified symptoms and signs involving the digestive system and abdomen: Secondary | ICD-10-CM | POA: Insufficient documentation

## 2015-11-15 DIAGNOSIS — R6889 Other general symptoms and signs: Secondary | ICD-10-CM | POA: Insufficient documentation

## 2015-11-15 DIAGNOSIS — M62830 Muscle spasm of back: Secondary | ICD-10-CM | POA: Insufficient documentation

## 2015-11-15 NOTE — Patient Instructions (Signed)

## 2015-11-15 NOTE — Telephone Encounter (Signed)
Medication Refill: ALPRAZolam (XANAX) 0.5 MG tablet

## 2015-11-15 NOTE — Therapy (Signed)
[place], [place], [address] Phone: [phone]   Fax:  [phone]  Physical Therapy Treatment  Patient Details  Name: Kara Bailey MRN: [id number] Date of Birth: 1955/12/22 Referring Provider: Boykin Nearing MD  Encounter Date: 11/15/2015      PT End of Session - 11/15/15 1024    Visit Number 5   Number of Visits 12   Date for PT Re-Evaluation 11/29/15   Authorization Type 100 % financial aid   PT Start Time 1019   PT Stop Time 1100   PT Time Calculation (min) 41 min      Past Medical History  Diagnosis Date  . Hepatitis C   . Shingles     left side  . Hepatitis C virus infection   . Palpitations   . PAC (premature atrial contraction) 10/11/2014  . PVC (premature ventricular contraction) 10/11/2014  . Dyspnea 08/2014  . Chest pain 08/2014    unspecified  . Tachycardia 08/2014  . Dizziness 08/2014  . Weakness 08/2014    Past Surgical History  Procedure Laterality Date  . Appendectomy    . Left heart catheterization with coronary angiogram N/A 10/29/2014    Procedure: LEFT HEART CATHETERIZATION WITH CORONARY ANGIOGRAM;  Surgeon: Burnell Blanks, MD;  Location: [facility] CATH LAB;  Service: Cardiovascular;  Laterality: N/A;    There were no vitals filed for this visit.  Visit Diagnosis:  Back muscle spasm  Abnormal posture  Left-sided low back pain without sciatica  Activity intolerance  Abdominal weakness      Subjective Assessment - 11/15/15 1023    Subjective I still cannot do the things I need to do like sweep, wash dishes or bend over. I cannot look for a job because I have this pain.    Currently in Pain? No/denies                         [facility] Adult PT Treatment/Exercise - 11/15/15 1036    Self-Care   Self-Care ADL's;Posture   ADL's Review of foot in cabinet with washing dishes. Pt reports it helps a little. Reviewed also mechanics with  sweeping, and repositioning couch cover. Pt instructed in hip hinge. She has previously been flexing lumbar spine to perform these activities   Knee/Hip Exercises: Seated   Sit to Sand 10 reps;without UE support                  PT Short Term Goals - 11/15/15 1101    PT SHORT TERM GOAL #1   Time 3   Period Weeks   Status Achieved   PT SHORT TERM GOAL #2   Title "Report pain decrease from 10/10 to 5 /10 with ADLs and light housework   Time 3   Period Weeks   Status On-going   PT SHORT TERM GOAL #3   Title "Demonstrate understanding of proper sitting posture and be more conscious of position and posture throughout the day.    Time 3   Period Weeks   Status Achieved   PT SHORT TERM GOAL #4   Title Pt will demonstrate proper body mechanics for utilizing broom and vacuum   Time 3   Period Weeks   Status Partially Met           PT Long Term Goals - 11/15/15 1100    PT LONG TERM GOAL #1   Title "Demonstrate and verbalize techniques to reduce the  risk of re-injury including: lifting, posture, body mechanics.    Time 6   Period Weeks   Status Partially Met   PT LONG TERM GOAL #2   Title "Pt will be independent with advanced HEP   Time 6   Period Weeks   Status On-going   PT LONG TERM GOAL #3   Title "Pain will decrease to 3/10 or less with all functional activities   Time 6   Period Weeks   Status On-going   PT LONG TERM GOAL #4   Title "Pt will not wake due to pain while turning in bed while sleeping at night   Time 6   Period Weeks   Status On-going   PT LONG TERM GOAL #5   Title "FOTO will improve from 74%limitation   to 52%limtation     indicating improved functional mobility.    Time 6   Period Weeks   Status On-going   PT LONG TERM GOAL #6   Title Pt will be able to grocery shop for 1 hour without dependence of utilizing grocery cart for UE support   Time 6   Period Weeks   Status On-going   PT LONG TERM GOAL #7   Title Pt will be able to  stand  for  2 hours in order to return to work/ normal activities  as able   Time 6   Period Weeks   Status On-going               Plan - 11/15/15 1025    Clinical Impression Statement Pt reports no improvement in ability to perform standing household activities. She is limited to 10 minutes before pain increases to 10/10 and requires her to sit down to rest. Body mechanics reviewed with patient today as well as sit-stand for knee/hip strength and to improve ability to squat rather than bend at waist for ADLs.    PT Next Visit Plan Review sit-stand,pt to bring other body mechanics questions         Problem List Patient Active Problem List   Diagnosis Date Noted  . Alcohol abuse 10/26/2015  . HTN (hypertension) 09/26/2015  . Chest pain 10/14/2014  . PAC (premature atrial contraction) 10/11/2014  . PVC (premature ventricular contraction) 10/11/2014  . Back muscle spasm 06/15/2014  . Generalized anxiety disorder 06/15/2014  . Smoking 06/15/2014  . 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 Chronic hepatitis C without hepatic coma ([place]) 05/28/2013  . Cirrhosis of liver ([place]) 05/20/2013  . Jaundice 05/04/2013  . Transaminitis 05/04/2013    Dorene Ar, PTA 11/15/2015, 11:03 AM  [facility] [address] [place], [place], [address] Phone: [phone]   Fax:  [phone]  Name: Kara Bailey MRN: [id number] Date of Birth: May 15, 1956

## 2015-11-16 ENCOUNTER — Telehealth: Payer: Self-pay | Admitting: *Deleted

## 2015-11-16 ENCOUNTER — Other Ambulatory Visit: Payer: Self-pay | Admitting: Family Medicine

## 2015-11-16 ENCOUNTER — Ambulatory Visit: Payer: Self-pay

## 2015-11-16 ENCOUNTER — Ambulatory Visit: Payer: Self-pay | Admitting: Pharmacist Clinician (PhC)/ Clinical Pharmacy Specialist

## 2015-11-16 DIAGNOSIS — B182 Chronic viral hepatitis C: Secondary | ICD-10-CM

## 2015-11-16 MED ORDER — NAPROXEN 500 MG PO TABS: 500.0000 mg | ORAL_TABLET | Freq: Two times a day (BID) | ORAL | Status: DC

## 2015-11-16 MED FILL — NAPROXEN 500 MG TABLET: 500 | 15 days supply | Qty: 30 | Fill #0

## 2015-11-16 NOTE — Telephone Encounter (Signed)
Pt notified & will pick up rx tomorrow.

## 2015-11-16 NOTE — Telephone Encounter (Signed)
Prescription for naproxen sent to her pharmacy and she should schedule an appointment with her PCP if symptoms persist

## 2015-11-16 NOTE — Telephone Encounter (Signed)
Date of birth verified by pt Xray results given  Pt stated still with pain and PT not helping

## 2015-11-16 NOTE — Progress Notes (Signed)
HPI: Kara Bailey is a 60 y.o. female who is here for her 2 wks f/u visit of her hep C.   Lab Results  Component Value Date   HCVGENOTYPE 1b 09/27/2014    Allergies: No Known Allergies  Vitals:    Past Medical History: Past Medical History  Diagnosis Date  . Hepatitis C   . Shingles     left side  . Hepatitis C virus infection   . Palpitations   . PAC (premature atrial contraction) 10/11/2014  . PVC (premature ventricular contraction) 10/11/2014  . Dyspnea 08/2014  . Chest pain 08/2014    unspecified  . Tachycardia 08/2014  . Dizziness 08/2014  . Weakness 08/2014    Social History: Social History   Social History  . Marital Status: Divorced    Spouse Name: N/A  . Number of Children: N/A  . Years of Education: N/A   Social History Main Topics  . Smoking status: Current Every Day Smoker -- 0.25 packs/day    Types: Cigarettes    Start date: 11/12/1968  . Smokeless tobacco: Never Used     Comment: cutting back  . Alcohol Use: 0.0 oz/week    0 Standard drinks or equivalent per week     Comment: beer on weekends  . Drug Use: No  . Sexual Activity: Not on file   Other Topics Concern  . Not on file   Social History Narrative    Labs: HEP B S AB (no units)  Date Value  10/14/2015 POS*   HEPATITIS B SURFACE AG (no units)  Date Value  10/14/2015 NEGATIVE   HCV AB (no units)  Date Value  05/04/2013 Reactive*    Lab Results  Component Value Date   HCVGENOTYPE 1b 09/27/2014    Hepatitis C RNA quantitative Latest Ref Rng 09/27/2014 05/10/2013  HCV Quantitative <15 IU/mL 369398(H) 37063(H)  HCV Quantitative Log <1.18 log 10 5.57(H) 4.57(H)    AST (U/L)  Date Value  09/26/2015 70*  09/15/2014 53*  09/08/2014 63*   ALT (U/L)  Date Value  09/26/2015 67*  09/15/2014 60*  09/08/2014 63*   INR  Date Value  10/14/2015 1.01  10/22/2014 1.0 ratio  09/27/2014 1.03    CrCl: CrCl cannot be calculated (Unknown ideal weight.).  Fibrosis  Score: F4 as assessed by [place]?  Child-Pugh Score: Class A  Previous Treatment Regimen: Naive  Assessment: 60 yo who is here for her 2 wks f/u of her hep C. She has done well on Zepatier and has only missed one dose due to vomiting. Otherwise, she doesn't have any side effects. She is not on ribavirin since it's 1b. Schedule her to come back for lab in 2 wks and then f/u with Dr. Linus Salmons in March.   Recommendations: Cont zepatier 1 PO qday x 3 months VL in 2 wks and appt Dr. Jerl Santos in March  Kara Bailey, [place].D., BCPS, AAHIVP Clinical Infectious [facility] 11/16/2015, 10:00 AM

## 2015-11-16 NOTE — Telephone Encounter (Signed)
-----   Message from Boykin Nearing, MD sent at 10/31/2015  4:03 PM EST ----- Back x-ray with disc space narrowing  There fracture or slipped disc

## 2015-11-16 NOTE — Progress Notes (Signed)
Patient called in complaining of back pain from bulging disc. Placed on naproxen and she will follow-up with her PCP.

## 2015-11-17 ENCOUNTER — Ambulatory Visit: Payer: Self-pay | Admitting: Physical Therapy

## 2015-11-18 MED ORDER — ALPRAZOLAM 0.5 MG PO TABS: 0.5000 mg | ORAL_TABLET | Freq: Three times a day (TID) | ORAL | Status: DC | PRN

## 2015-11-18 NOTE — Addendum Note (Signed)
Addended by: Boykin Nearing on: 11/18/2015 08:58 AM   Modules accepted: Orders

## 2015-11-18 NOTE — Telephone Encounter (Signed)
Pt aware Rx at front office ready to be pick up  

## 2015-11-18 NOTE — Telephone Encounter (Signed)
Xanax refilled and ready for pick up   

## 2015-11-21 ENCOUNTER — Ambulatory Visit: Payer: Self-pay | Admitting: Physical Therapy

## 2015-11-21 ENCOUNTER — Ambulatory Visit: Payer: Self-pay

## 2015-11-24 ENCOUNTER — Ambulatory Visit (HOSPITAL_COMMUNITY)
Admission: RE | Admit: 2015-11-24 | Discharge: 2015-11-24 | Disposition: A | Discharge status: Home / Self Care | Payer: Self-pay | Source: Ambulatory Visit | Attending: Internal Medicine | Admitting: Internal Medicine

## 2015-11-24 DIAGNOSIS — I77811 Abdominal aortic ectasia: Secondary | ICD-10-CM | POA: Insufficient documentation

## 2015-11-24 DIAGNOSIS — B182 Chronic viral hepatitis C: Secondary | ICD-10-CM | POA: Insufficient documentation

## 2015-11-25 ENCOUNTER — Ambulatory Visit: Payer: Self-pay | Admitting: Physical Therapy

## 2015-11-25 DIAGNOSIS — M545 Low back pain, unspecified: Secondary | ICD-10-CM

## 2015-11-25 DIAGNOSIS — R293 Abnormal posture: Secondary | ICD-10-CM

## 2015-11-25 DIAGNOSIS — R198 Other specified symptoms and signs involving the digestive system and abdomen: Secondary | ICD-10-CM

## 2015-11-25 DIAGNOSIS — R6889 Other general symptoms and signs: Secondary | ICD-10-CM

## 2015-11-25 DIAGNOSIS — M62830 Muscle spasm of back: Secondary | ICD-10-CM

## 2015-11-25 NOTE — Therapy (Signed)
[place], [place], [address] Phone: [phone]   Fax:  [phone]  Physical Therapy Treatment  Patient Details  Name: Kara Bailey MRN: [id number] Date of Birth: [birth year] Referring Provider: Boykin Nearing MD  Encounter Date: 11/25/2015      PT End of Session - 11/25/15 0928    Visit Number 6   Number of Visits 12   Date for PT Re-Evaluation 11/29/15   Authorization Type 100 % financial aid   PT Start Time 0845   PT Stop Time 0930   PT Time Calculation (min) 45 min      Past Medical History  Diagnosis Date  . Hepatitis C   . Shingles     left side  . Hepatitis C virus infection   . Palpitations   . PAC (premature atrial contraction) 10/11/2014  . PVC (premature ventricular contraction) 10/11/2014  . Dyspnea 08/2014  . Chest pain 08/2014    unspecified  . Tachycardia 08/2014  . Dizziness 08/2014  . Weakness 08/2014    Past Surgical History  Procedure Laterality Date  . Appendectomy    . Left heart catheterization with coronary angiogram N/A 10/29/2014    Procedure: LEFT HEART CATHETERIZATION WITH CORONARY ANGIOGRAM;  Surgeon: Burnell Blanks, MD;  Location: [facility] CATH LAB;  Service: Cardiovascular;  Laterality: N/A;    There were no vitals filed for this visit.  Visit Diagnosis:  Back muscle spasm  Abnormal posture  Left-sided low back pain without sciatica  Activity intolerance  Abdominal weakness      Subjective Assessment - 11/25/15 0939    Subjective I am doing good as long as I dont do anything.   Currently in Pain? No/denies   Aggravating Factors  standing > 10 minutes   Pain Relieving Factors changing positions, body mechanics tips                         [facility] Adult PT Treatment/Exercise - 11/25/15 0001    Lumbar Exercises: Stretches   Active Hamstring Stretch 3 reps;30 seconds   Active Hamstring Stretch Limitations with strap  bilateral   Lumbar Exercises: Aerobic   Stationary Bike NuStep level 5, 7 min UE and LE    Lumbar Exercises: Supine   Bridge 5 reps  shoulder bridge   Bridge Limitations with  5 clams each time  added red band for 5 more reps   Straight Leg Raise 10 reps  2 sets   Straight Leg Raises Limitations improved Rom left    Other Supine Lumbar Exercises adductor squeeze with ball- 5 sec x 10   Knee/Hip Exercises: Standing   Other Standing Knee Exercises Tra contract with pelvic tilt to attain neutral spine and hold while standing and walking                  PT Short Term Goals - 11/25/15 1031    PT SHORT TERM GOAL #1   Title "Independent with initial HEP   Time 3   Period Weeks   Status Achieved   PT SHORT TERM GOAL #2   Title "Report pain decrease from 10/10 to 5 /10 with ADLs and light housework   Time 3   Period Weeks   Status On-going   PT SHORT TERM GOAL #3   Title "Demonstrate understanding of proper sitting posture and be more conscious of position and posture throughout the day.    Time 3  Period Weeks   Status Achieved   PT SHORT TERM GOAL #4   Title Pt will demonstrate proper body mechanics for utilizing broom and vacuum   Time 3   Period Weeks   Status Partially Met           PT Long Term Goals - 11/25/15 1032    PT LONG TERM GOAL #1   Title "Demonstrate and verbalize techniques to reduce the risk of re-injury including: lifting, posture, body mechanics.    Period Weeks   Status Partially Met   PT LONG TERM GOAL #2   Title "Pt will be independent with advanced HEP   Time 6   Period Weeks   Status On-going   PT LONG TERM GOAL #3   Title "Pain will decrease to 3/10 or less with all functional activities   Time 6   Period Weeks   Status On-going   PT LONG TERM GOAL #4   Title "Pt will not wake due to pain while turning in bed while sleeping at night   Time 6   Period Weeks   Status On-going   PT LONG TERM GOAL #5   Title "FOTO will improve  from 74%limitation   to 52%limtation     indicating improved functional mobility.    Time 6   Period Weeks   Status On-going   PT LONG TERM GOAL #6   Title Pt will be able to grocery shop for 1 hour without dependence of utilizing grocery cart for UE support   Time 6   Period Weeks   Status On-going   PT LONG TERM GOAL #7   Title Pt will be able to  stand for  2 hours in order to return to work/ normal activities  as able   Time 6   Period Weeks   Status On-going               Plan - 11/25/15 0942    Clinical Impression Statement Pt reports using body mechanics techniques and noticed a little decreased pain with making bed and washing dishes. She continues to be limited to standing no more than 10 minutes due to pain. Instructed pt in standing abdominal brace with standing and walking. She will assess the benefit with ADLs. Progressed bridge exercises to include red band clams.    PT Next Visit Plan Renewal next/ FOTO, progress bridge exercises as tolerated, try reformer? review Add hamstring stretch with strap to HEP; reformer?        Problem List Patient Active Problem List   Diagnosis Date Noted  . Alcohol abuse 10/26/2015  . HTN (hypertension) 09/26/2015  . Chest pain 10/14/2014  . PAC (premature atrial contraction) 10/11/2014  . PVC (premature ventricular contraction) 10/11/2014  . Back muscle spasm 06/15/2014  . Generalized anxiety disorder 06/15/2014  . Smoking 06/15/2014  . 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 Chronic hepatitis C without hepatic coma ([place]) 05/28/2013  . Cirrhosis of liver ([place]) 05/20/2013  . Jaundice 05/04/2013  . Transaminitis 05/04/2013    Dorene Ar, PTA 11/25/2015, 10:33 AM  [facility] [address] [place], [place], [address] Phone: [phone]   Fax:  [phone]  Name: Kara Bailey MRN: [id number] Date of Birth: 1956-08-26

## 2015-11-28 ENCOUNTER — Other Ambulatory Visit: Payer: Self-pay

## 2015-11-28 DIAGNOSIS — B182 Chronic viral hepatitis C: Secondary | ICD-10-CM

## 2015-11-29 ENCOUNTER — Ambulatory Visit: Payer: Self-pay | Admitting: Physical Therapy

## 2015-11-29 LAB — HEPATITIS C RNA QUANTITATIVE: HCV Quantitative: NOT DETECTED IU/mL (ref <15)

## 2015-12-05 ENCOUNTER — Ambulatory Visit: Payer: Self-pay | Admitting: Physical Therapy

## 2015-12-26 ENCOUNTER — Telehealth: Payer: Self-pay | Admitting: Family Medicine

## 2015-12-26 DIAGNOSIS — F411 Generalized anxiety disorder: Secondary | ICD-10-CM

## 2015-12-26 NOTE — Telephone Encounter (Signed)
Pt. Needs a refill on her Xanax....please follow up with patient

## 2015-12-27 MED ORDER — ALPRAZOLAM 0.5 MG PO TABS: 0.5000 mg | ORAL_TABLET | Freq: Three times a day (TID) | ORAL | Status: DC | PRN

## 2015-12-27 NOTE — Telephone Encounter (Signed)
Need her signature on [employer] application to get Zepatier to treat Hep C

## 2015-12-27 NOTE — Telephone Encounter (Signed)
Xanax ready for pick up

## 2015-12-28 NOTE — Telephone Encounter (Signed)
Pt notified Rx at front office ready to be pickup 

## 2016-01-02 ENCOUNTER — Ambulatory Visit: Payer: Self-pay

## 2016-01-03 ENCOUNTER — Ambulatory Visit
Admission: RE | Admit: 2016-01-03 | Discharge: 2016-01-03 | Disposition: A | Discharge status: Home / Self Care | Payer: No Typology Code available for payment source | Source: Ambulatory Visit | Attending: Family Medicine | Admitting: Family Medicine

## 2016-01-03 DIAGNOSIS — Z0000 Encounter for general adult medical examination without abnormal findings: Secondary | ICD-10-CM

## 2016-01-09 ENCOUNTER — Other Ambulatory Visit: Payer: Self-pay | Admitting: Family Medicine

## 2016-01-09 DIAGNOSIS — R928 Other abnormal and inconclusive findings on diagnostic imaging of breast: Secondary | ICD-10-CM

## 2016-01-13 ENCOUNTER — Ambulatory Visit: Payer: No Typology Code available for payment source | Attending: Family Medicine

## 2016-01-13 ENCOUNTER — Telehealth: Payer: Self-pay | Admitting: *Deleted

## 2016-01-13 NOTE — Telephone Encounter (Signed)
Pt aware  Has [place] and diagnostic mammorgam appointment

## 2016-01-13 NOTE — Telephone Encounter (Signed)
-----   Message from Boykin Nearing, MD sent at 01/09/2016  1:24 PM EST ----- Abnormal mammogram F/u diagnostic mammogram and possibly [place] of R breast needed and ordered

## 2016-01-17 ENCOUNTER — Ambulatory Visit
Admission: RE | Admit: 2016-01-17 | Discharge: 2016-01-17 | Disposition: A | Discharge status: Home / Self Care | Payer: No Typology Code available for payment source | Source: Ambulatory Visit | Attending: Family Medicine | Admitting: Family Medicine

## 2016-01-17 DIAGNOSIS — R928 Other abnormal and inconclusive findings on diagnostic imaging of breast: Secondary | ICD-10-CM

## 2016-01-19 ENCOUNTER — Encounter: Payer: Self-pay | Admitting: Internal Medicine

## 2016-01-19 ENCOUNTER — Ambulatory Visit (INDEPENDENT_AMBULATORY_CARE_PROVIDER_SITE_OTHER): Payer: Self-pay | Admitting: Internal Medicine

## 2016-01-19 VITALS — BP 147/84 | HR 78 | Temp 97.6°F | Wt 106.0 lb

## 2016-01-19 DIAGNOSIS — B182 Chronic viral hepatitis C: Secondary | ICD-10-CM

## 2016-01-19 DIAGNOSIS — K7469 Other cirrhosis of liver: Secondary | ICD-10-CM

## 2016-01-19 NOTE — Progress Notes (Signed)
   Subjective:    Patient ID: Kara Bailey, female    DOB: [birth year], 60 y.o.   MRN: [id number]  HPI Here for follow up of HCV.  Has gentoype 1b, viral load 369,000, started on Zepatier and has only about 2 weeks left.  Elastography also with F0/1.  Previously thought to have cirrhosis but does not note anything concerning on the elastography.  Undetectable viral load early.  Pleased with results.     Review of Systems  Constitutional: Negative for fatigue.  Gastrointestinal: Negative for nausea.  Skin: Negative for rash.  Neurological: Negative for headaches.       Objective:   Physical Exam  Constitutional: She appears well-developed and well-nourished. No distress.  Eyes: No scleral icterus.  Skin: No rash noted.          Assessment & Plan:

## 2016-01-19 NOTE — Assessment & Plan Note (Signed)
Doing great.  rtc 4 months for SVR12.

## 2016-01-19 NOTE — Assessment & Plan Note (Signed)
I do not see any concerns for cirrhosis with no lab abnormalities, no imaging concerns.  I will take this off her record.

## 2016-02-06 ENCOUNTER — Telehealth: Payer: Self-pay | Admitting: Family Medicine

## 2016-02-06 DIAGNOSIS — F411 Generalized anxiety disorder: Secondary | ICD-10-CM

## 2016-02-06 NOTE — Telephone Encounter (Signed)
Patient is requesting medication refill for Xanax...please follow up with patient

## 2016-02-09 NOTE — Telephone Encounter (Signed)
Patient called requesting tramadol. Please follow up.

## 2016-02-09 NOTE — Telephone Encounter (Signed)
I do not see tramadol on her med list; she might have to discuss that with her PCP-Dr.Funches as well as refill. Xanax

## 2016-02-14 MED ORDER — ALPRAZOLAM 0.5 MG PO TABS: 0.5000 mg | ORAL_TABLET | Freq: Three times a day (TID) | ORAL | Status: DC | PRN

## 2016-02-14 NOTE — Telephone Encounter (Signed)
Xanax refilled  Patient to return for OV to discuss pain and treatment options No refill on tramadol yet

## 2016-03-08 ENCOUNTER — Ambulatory Visit: Payer: No Typology Code available for payment source | Attending: Family Medicine

## 2016-03-09 ENCOUNTER — Encounter: Payer: Self-pay | Admitting: Clinical

## 2016-03-09 NOTE — Progress Notes (Addendum)
  Depression screen [facility] 2/9 03/08/2016 10/26/2015 10/25/2015 09/26/2015 09/26/2015  Decreased Interest 3 1 0 2 0  Down, Depressed, Hopeless 3 1 0 1 0  PHQ - 2 Score 6 2 0 3 0  Altered sleeping 3 1 - 3 -  Tired, decreased energy 3 1 - 3 -  Change in appetite 3 1 - 2 -  Feeling bad or failure about yourself  2 1 - 0 -  Trouble concentrating 1 1 - 1 -  Moving slowly or fidgety/restless 3 0 - 1 -  Suicidal thoughts 0 0 - 0 -  PHQ-9 Score 21 7 - 13 -  Difficult doing work/chores - Somewhat difficult - - -    GAD 7 : Generalized Anxiety Score 03/09/2016 09/26/2015  Nervous, Anxious, on Edge 3 3  Control/stop worrying 3 1  Worry too much - different things 3 1  Trouble relaxing 3 3  Restless 2 0  Easily annoyed or irritable 3 3  Afraid - awful might happen 3 0  Total GAD 7 Score 20 11

## 2016-03-16 ENCOUNTER — Encounter: Payer: Self-pay | Admitting: Family Medicine

## 2016-03-16 ENCOUNTER — Ambulatory Visit: Payer: Self-pay | Attending: Family Medicine | Admitting: Family Medicine

## 2016-03-16 VITALS — BP 147/89 | HR 76 | Temp 99.1°F | Resp 16 | Ht 66.0 in | Wt 112.0 lb

## 2016-03-16 DIAGNOSIS — R1310 Dysphagia, unspecified: Secondary | ICD-10-CM | POA: Insufficient documentation

## 2016-03-16 DIAGNOSIS — F17210 Nicotine dependence, cigarettes, uncomplicated: Secondary | ICD-10-CM | POA: Insufficient documentation

## 2016-03-16 DIAGNOSIS — F411 Generalized anxiety disorder: Secondary | ICD-10-CM

## 2016-03-16 DIAGNOSIS — Z79899 Other long term (current) drug therapy: Secondary | ICD-10-CM | POA: Insufficient documentation

## 2016-03-16 DIAGNOSIS — K219 Gastro-esophageal reflux disease without esophagitis: Secondary | ICD-10-CM

## 2016-03-16 DIAGNOSIS — R740 Nonspecific elevation of levels of transaminase and lactic acid dehydrogenase [LDH]: Secondary | ICD-10-CM | POA: Insufficient documentation

## 2016-03-16 DIAGNOSIS — I10 Essential (primary) hypertension: Secondary | ICD-10-CM

## 2016-03-16 DIAGNOSIS — K449 Diaphragmatic hernia without obstruction or gangrene: Secondary | ICD-10-CM

## 2016-03-16 DIAGNOSIS — R7401 Elevation of levels of liver transaminase levels: Secondary | ICD-10-CM

## 2016-03-16 MED ORDER — FLUOXETINE HCL 20 MG PO TABS
20.0000 mg | ORAL_TABLET | Freq: Every day | ORAL | Status: DC
Start: 2016-03-16 — End: 2017-03-12

## 2016-03-16 MED ORDER — METOPROLOL SUCCINATE ER 100 MG PO TB24: 100.0000 mg | ORAL_TABLET | Freq: Every day | ORAL | Status: DC

## 2016-03-16 MED ORDER — ALPRAZOLAM 0.5 MG PO TABS: 0.5000 mg | ORAL_TABLET | Freq: Every evening | ORAL | Status: DC | PRN

## 2016-03-16 MED ORDER — PANTOPRAZOLE SODIUM 40 MG PO TBEC: 40.0000 mg | DELAYED_RELEASE_TABLET | Freq: Every day | ORAL | Status: DC

## 2016-03-16 MED FILL — METOPROLOL SUCC ER 100 MG T: 100 | 30 days supply | Qty: 30 | Fill #0

## 2016-03-16 MED FILL — FLUoxetine HCL 20 MG CAPS: 20 | 30 days supply | Qty: 30 | Fill #0

## 2016-03-16 NOTE — Progress Notes (Signed)
F/U HTN  Medicine refills Xanax Pt complaining of throat problems  Tobacco user 6 cigarette per day  No suicidal thoughts in the past two weeks  No pain today

## 2016-03-16 NOTE — Assessment & Plan Note (Signed)
IMPRESSION 06/02/2013   1. Small sliding-type hiatal hernia but no demonstrable GE reflux. 2. Normal esophageal motility. 3. Unremarkable examination of the stomach and duodenum.

## 2016-03-16 NOTE — Patient Instructions (Addendum)
Kara Bailey was seen today for hypertension and dysphagia.  Diagnoses and all orders for this visit:  Transaminitis -     COMPLETE METABOLIC PANEL WITH GFR  Hiatal hernia -     Ambulatory referral to Gastroenterology  Dysphagia -     Ambulatory referral to Gastroenterology  Odynophagia -     Ambulatory referral to Gastroenterology  Anxiety state -     FLUoxetine (PROZAC) 20 MG tablet; Take 1 tablet (20 mg total) by mouth daily. -     ALPRAZolam (XANAX) 0.5 MG tablet; Take 1 tablet (0.5 mg total) by mouth at bedtime as needed for anxiety.  Essential hypertension -     metoprolol succinate (TOPROL-XL) 100 MG 24 hr tablet; Take 1 tablet (100 mg total) by mouth daily. Take with or immediately following a meal.   Stop paxil Start prozac Increase metoprolol to 100 mg daily   F/u in 4 weeks for HTN  Dr. Adrian Blackwater

## 2016-03-16 NOTE — Progress Notes (Signed)
Subjective:  Patient ID: Kara Bailey, female    DOB: 02-Nov-1956  Age: 60 y.o. MRN: [id number]  CC: Hypertension and Gastroesophageal Reflux   HPI ALVERETTA VALLONE has HTN, hep C treated, smoker, ETOH abuse, anxiety she presents for   1. CHRONIC HYPERTENSION  Disease Monitoring  Blood pressure range: not checking   Chest pain: no   Dyspnea: no   Claudication: no   Medication compliance: yes  Medication Side Effects  Lightheadedness: no   Urinary frequency: no   Edema: no    2. Trouble swallowing: first noted 15 years. Had EGD and esophageal dilation about 7-8 years ago. Symptoms improved for about 1-2 years. Then started to return. She UGI swallow evaluation in 2014 that was normal except for hiatal hernia. She has  worsening trouble swallowing solid along with some pain with swallowing over the past year. Now cannot swallow meat.  She has hx of ETOH abuse. She has hx of smoking 1-2 PDD. She is now Drinking beer. Drinks a 6 pack in one weeks. Smokes now smokes 6-8 cigs per day. She takes naproxen 1 pills 1-2 times per for stomach pain. No fever, chills, weakness, numbness, speech difficulty or facial droop.   3. Anxiety: her mother passed away last month. She has stressed mood and depressed mood. She has little interest in completing task. The takes xanax at night which helps with sleep. She reports paxil has not improved her symptoms. She reports GI upset with paxil.   Social History  Substance Use Topics  . Smoking status: Current Every Day Smoker -- 0.25 packs/day    Types: Cigarettes    Start date: 11/12/1968  . Smokeless tobacco: Never Used     Comment: cutting back  . Alcohol Use: 0.0 oz/week    0 Standard drinks or equivalent per week     Comment: beer on weekends   Outpatient Prescriptions Prior to Visit  Medication Sig Dispense Refill  . ALPRAZolam (XANAX) 0.5 MG tablet Take 1 tablet (0.5 mg total) by mouth 3 (three) times daily as needed for anxiety. 30 tablet 0   . Elbasvir-Grazoprevir (ZEPATIER) 50-100 MG TABS Take 1 tablet by mouth daily. 28 tablet 2  . metoprolol succinate (TOPROL-XL) 50 MG 24 hr tablet Take 1 tablet (50 mg total) by mouth daily. Take with or immediately following a meal. 30 tablet 5  . naproxen (NAPROSYN) 500 MG tablet Take 1 tablet (500 mg total) by mouth 2 (two) times daily with a meal. 30 tablet 0  . pantoprazole (PROTONIX) 40 MG tablet Take 1 tablet (40 mg total) by mouth daily. 30 tablet 3  . PARoxetine (PAXIL) 20 MG tablet Take 1 tablet (20 mg total) by mouth daily. 30 tablet 2   No facility-administered medications prior to visit.    ROS Review of Systems  Constitutional: Negative for fever and chills.  HENT: Positive for trouble swallowing.   Eyes: Negative for visual disturbance.  Respiratory: Negative for shortness of breath.   Cardiovascular: Negative for chest pain.  Gastrointestinal: Negative for abdominal pain and blood in stool.  Musculoskeletal: Negative for back pain and arthralgias.  Skin: Negative for rash.  Allergic/Immunologic: Negative for immunocompromised state.  Neurological: Negative for tremors, facial asymmetry, speech difficulty and weakness.  Hematological: Negative for adenopathy. Does not bruise/bleed easily.  Psychiatric/Behavioral: Negative for suicidal ideas and dysphoric mood. The patient is nervous/anxious.     Objective:  BP 147/89 mmHg  Pulse 76  Temp(Src) 99.1 F (37.3 C) (Oral)  Resp 16  Ht 5\' 6"  (1.676 m)  Wt 112 lb (50.803 kg)  BMI 18.09 kg/m2  SpO2 100%  BP/Weight 03/16/2016 01/19/2016 99991111  Systolic BP Q000111Q Q000111Q Q000111Q  Diastolic BP 89 84 93  Wt. (Lbs) 112 106 111  BMI 18.09 17.12 17.92   Physical Exam  Constitutional: She is oriented to person, place, and time. She appears well-developed and well-nourished. She appears cachectic. She has a sickly appearance. No distress.  HENT:  Head: Normocephalic and atraumatic.  Cardiovascular: Normal rate, regular rhythm,  normal heart sounds and intact distal pulses.   Pulmonary/Chest: Effort normal and breath sounds normal.  Musculoskeletal: She exhibits no edema.  Neurological: She is alert and oriented to person, place, and time.  Skin: Skin is warm and dry. No rash noted.  Psychiatric: She has a normal mood and affect.    Assessment & Plan:   There are no diagnoses linked to this encounter. Myrel was seen today for hypertension and dysphagia.  Diagnoses and all orders for this visit:  Transaminitis -     COMPLETE METABOLIC PANEL WITH GFR  Hiatal hernia -     Ambulatory referral to Gastroenterology  Dysphagia -     Ambulatory referral to Gastroenterology  Odynophagia -     Ambulatory referral to Gastroenterology  Anxiety state -     FLUoxetine (PROZAC) 20 MG tablet; Take 1 tablet (20 mg total) by mouth daily. -     ALPRAZolam (XANAX) 0.5 MG tablet; Take 1 tablet (0.5 mg total) by mouth at bedtime as needed for anxiety.  Essential hypertension -     metoprolol succinate (TOPROL-XL) 100 MG 24 hr tablet; Take 1 tablet (100 mg total) by mouth daily. Take with or immediately following a meal.   Meds ordered this encounter  Medications  . FLUoxetine (PROZAC) 20 MG tablet    Sig: Take 1 tablet (20 mg total) by mouth daily.    Dispense:  30 tablet    Refill:  3  . ALPRAZolam (XANAX) 0.5 MG tablet    Sig: Take 1 tablet (0.5 mg total) by mouth at bedtime as needed for anxiety.    Dispense:  30 tablet    Refill:  2  . metoprolol succinate (TOPROL-XL) 100 MG 24 hr tablet    Sig: Take 1 tablet (100 mg total) by mouth daily. Take with or immediately following a meal.    Dispense:  30 tablet    Refill:  5    Follow-up: No Follow-up on file.   Boykin Nearing MD

## 2016-03-16 NOTE — Assessment & Plan Note (Signed)
HTN uncontrolled Increase metoprolol to 100 mg daily

## 2016-03-16 NOTE — Assessment & Plan Note (Signed)
Anxiety is generalized  Plan for  prozac to replace paxil Refilled xanax for nightly use

## 2016-03-16 NOTE — Assessment & Plan Note (Signed)
Worsening odynophagia in patient with hx of ETOH and smoking  Plan for GI referral for EGD r/o Barret's esophagus/cancer  Protonix 40 mg refilled

## 2016-03-17 LAB — COMPLETE METABOLIC PANEL WITH GFR
ALBUMIN: 4 g/dL (ref 3.6–5.1)
ALK PHOS: 84 U/L (ref 33–130)
ALT: 14 U/L (ref 6–29)
AST: 26 U/L (ref 10–35)
BUN: 7 mg/dL (ref 7–25)
CHLORIDE: 101 mmol/L (ref 98–110)
CO2: 25 mmol/L (ref 20–31)
Calcium: 9.3 mg/dL (ref 8.6–10.4)
Creat: 0.78 mg/dL (ref 0.50–0.99)
GFR, EST NON AFRICAN AMERICAN: 82 mL/min (ref >=60)
GFR, Est African American: >89 mL/min (ref >=60)
Glucose, Bld: 76 mg/dL (ref 65–99)
POTASSIUM: 4.6 mmol/L (ref 3.5–5.3)
SODIUM: 135 mmol/L (ref 135–146)
Total Bilirubin: 0.6 mg/dL (ref 0.2–1.2)
Total Protein: 6.6 g/dL (ref 6.1–8.1)

## 2016-03-21 ENCOUNTER — Emergency Department (HOSPITAL_COMMUNITY): Payer: No Typology Code available for payment source

## 2016-03-21 ENCOUNTER — Emergency Department (HOSPITAL_COMMUNITY)
Admission: EM | Admit: 2016-03-21 | Discharge: 2016-03-21 | Disposition: A | Discharge status: Home / Self Care | Payer: No Typology Code available for payment source | Attending: Emergency Medicine | Admitting: Emergency Medicine

## 2016-03-21 ENCOUNTER — Encounter (HOSPITAL_COMMUNITY): Payer: Self-pay | Admitting: Emergency Medicine

## 2016-03-21 ENCOUNTER — Telehealth: Payer: Self-pay | Admitting: *Deleted

## 2016-03-21 ENCOUNTER — Encounter: Payer: Self-pay | Admitting: Internal Medicine

## 2016-03-21 DIAGNOSIS — S52592A Other fractures of lower end of left radius, initial encounter for closed fracture: Secondary | ICD-10-CM | POA: Insufficient documentation

## 2016-03-21 DIAGNOSIS — Z79899 Other long term (current) drug therapy: Secondary | ICD-10-CM | POA: Insufficient documentation

## 2016-03-21 DIAGNOSIS — Y998 Other external cause status: Secondary | ICD-10-CM | POA: Insufficient documentation

## 2016-03-21 DIAGNOSIS — Y92410 Unspecified street and highway as the place of occurrence of the external cause: Secondary | ICD-10-CM | POA: Insufficient documentation

## 2016-03-21 DIAGNOSIS — Z8679 Personal history of other diseases of the circulatory system: Secondary | ICD-10-CM | POA: Insufficient documentation

## 2016-03-21 DIAGNOSIS — S46811A Strain of other muscles, fascia and tendons at shoulder and upper arm level, right arm, initial encounter: Secondary | ICD-10-CM | POA: Insufficient documentation

## 2016-03-21 DIAGNOSIS — S46812A Strain of other muscles, fascia and tendons at shoulder and upper arm level, left arm, initial encounter: Secondary | ICD-10-CM | POA: Insufficient documentation

## 2016-03-21 DIAGNOSIS — Z8619 Personal history of other infectious and parasitic diseases: Secondary | ICD-10-CM | POA: Insufficient documentation

## 2016-03-21 DIAGNOSIS — F17210 Nicotine dependence, cigarettes, uncomplicated: Secondary | ICD-10-CM | POA: Insufficient documentation

## 2016-03-21 DIAGNOSIS — S29012A Strain of muscle and tendon of back wall of thorax, initial encounter: Secondary | ICD-10-CM | POA: Insufficient documentation

## 2016-03-21 DIAGNOSIS — S52502A Unspecified fracture of the lower end of left radius, initial encounter for closed fracture: Secondary | ICD-10-CM

## 2016-03-21 DIAGNOSIS — Y9389 Activity, other specified: Secondary | ICD-10-CM | POA: Insufficient documentation

## 2016-03-21 DIAGNOSIS — S199XXA Unspecified injury of neck, initial encounter: Secondary | ICD-10-CM | POA: Insufficient documentation

## 2016-03-21 MED ORDER — OXYCODONE-ACETAMINOPHEN 5-325 MG PO TABS: 1.0000 | ORAL_TABLET | Freq: Four times a day (QID) | ORAL | Status: DC | PRN

## 2016-03-21 MED ORDER — OXYCODONE-ACETAMINOPHEN 5-325 MG PO TABS
1.0000 | ORAL_TABLET | Freq: Once | ORAL | Status: AC
  Administered 2016-03-21: 1 via ORAL

## 2016-03-21 MED ORDER — OXYCODONE-ACETAMINOPHEN 5-325 MG PO TABS
ORAL_TABLET | ORAL | Status: AC
  Filled 2016-03-21: qty 1

## 2016-03-21 MED ORDER — OXYCODONE-ACETAMINOPHEN 5-325 MG PO TABS
1.0000 | ORAL_TABLET | ORAL | Status: DC | PRN
  Administered 2016-03-21: 1 via ORAL
  Filled 2016-03-21: qty 1

## 2016-03-21 NOTE — Progress Notes (Signed)
Orthopedic Tech Progress Note Patient Details:  Kara Bailey 1956/05/22 [id number]  Ortho Devices Type of Ortho Device: Ace wrap, Arm sling, Sugartong splint Ortho Device/Splint Location: lue Ortho Device/Splint Interventions: Application   [name] 03/21/2016, 11:24 AM
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
## 2016-03-21 NOTE — Telephone Encounter (Signed)
-----   Message from Boykin Nearing, MD sent at 03/19/2016  8:54 AM EDT ----- Normal CMP

## 2016-03-21 NOTE — Discharge Instructions (Signed)
Radial Fracture  A radial fracture is a break in the radius bone, which is the long bone of the forearm that is on the same side as your thumb. Your forearm is the part of your arm that is between your elbow and your wrist. It is made up of two bones: the radius and the ulna.  Most radial fractures occur near the wrist (distal radialfracture) or near the elbow (radial head fracture). A distal radial fracture is the most common type of broken arm. This fracture usually occurs about an inch above the wrist. Fractures of the middle part of the bone are less common.  CAUSES   Falling with your arm outstretched is the most common cause of a radial fracture. Other causes include:   Car accidents.   Bike accidents.   A direct blow to the middle part of the radius.  RISK FACTORS   You may be at greater risk for a distal radial fracture if you are 60 years of age or older.   You may be at greater risk for a radial head fracture if you are:    Female.    30-40 years old.   You may be at a greater risk for all types of radial fractures if you have a condition that causes your bones to be weak or thin (osteoporosis).  SIGNS AND SYMPTOMS  A radial fracture causes pain immediately after the injury. Other signs and symptoms include:   An abnormal bend or bump in your arm (deformity).   Swelling.   Bruising.   Numbness or tingling.   Tenderness.   Limited movement.  DIAGNOSIS   Your health care provider may diagnose a radial fracture based on:   Your symptoms.   Your medical history, including any recent injury.   A physical exam. Your health care provider will look for any deformity and feel for tenderness over the break. Your health care provider will also check whether the bone is out of place.   An X-ray exam to confirm the diagnosis and learn more about the type of fracture.  TREATMENT  The goals of treatment are to get the bone in proper position for healing and to keep it from moving so it will heal over  time. Your treatment will depend on many factors, especially the type of fracture that you have.   If the fractured bone:    Is in the correct position (nondisplaced), you may only need to wear a cast or a splint.    Has a slightly displaced fracture, you may need to have the bones moved back into place manually (closed reduction) before the splint or cast is put on.   You may have a temporary splint before you have a plaster cast. The splint allows room for some swelling. After a few days, a cast can replace the splint.    You may have to wear the cast for about 6 weeks or as directed by your health care provider.    The cast may be changed after about 3 weeks or as directed by your health care provider.   After your cast is taken off, you may need physical therapy to regain full movement in your wrist or elbow.   You may need emergency surgery if you have:    A fractured bone that is out of position (displaced).    A fracture with multiple fragments (comminuted fracture).    A fracture that breaks the skin (open fracture). This type of   fracture may require surgical wires, plates, or screws to hold the bone in place.   You may have X-rays every couple of weeks to check on your healing.  HOME CARE INSTRUCTIONS   Keep the injured arm above the level of your heart while you are sitting or lying down. This helps to reduce swelling and pain.   Apply ice to the injured area:    Put ice in a plastic bag.    Place a towel between your skin and the bag.    Leave the ice on for 20 minutes, 2-3 times per day.   Move your fingers often to avoid stiffness and to minimize swelling.   If you have a plaster or fiberglass cast:    Do not try to scratch the skin under the cast using sharp or pointed objects.    Check the skin around the cast every day. You may put lotion on any red or sore areas.    Keep your cast dry and clean.   If you have a plaster splint:    Wear the splint as directed.    Loosen the elastic around  the splint if your fingers become numb and tingle, or if they turn cold and blue.   Do not put pressure on any part of your cast until it is fully hardened. Rest your cast only on a pillow for the first 24 hours.   Protect your cast or splint while bathing or showering, as directed by your health care provider. Do not put your cast or splint into water.   Take medicines only as directed by your health care provider.   Return to activities, such as sports, as directed by your health care provider. Ask your health care provider what activities are safe for you.   Keep all follow-up visits as directed by your health care provider. This is important.  SEEK MEDICAL CARE IF:   Your pain medicine is not helping.   Your cast gets damaged or it breaks.   Your cast becomes loose.   Your cast gets wet.   You have more severe pain or swelling than you did before the cast.   You have severe pain when stretching your fingers.   You continue to have pain or stiffness in your elbow or your wrist after your cast is taken off.  SEEK IMMEDIATE MEDICAL CARE IF:   You cannot move your fingers.   You lose feeling in your fingers or your hand.   Your hand or your fingers turn cold and pale or blue.   You notice a bad smell coming from your cast.   You have drainage from underneath your cast.   You have new stains from blood or drainage seeping through your cast.     This information is not intended to replace advice given to you by your health care provider. Make sure you discuss any questions you have with your health care provider.     Document Released: 04/11/2006 Document Revised: 11/19/2014 Document Reviewed: 04/23/2014  [employer] Interactive Patient Education 2016 [employer]
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
## 2016-03-21 NOTE — ED Notes (Signed)
This RN present with Ortho during application of splint and education and care of the cast.

## 2016-03-21 NOTE — ED Notes (Signed)
Patient changed in to a scrub top.

## 2016-03-21 NOTE — ED Notes (Signed)
Ortho Paged.  

## 2016-03-21 NOTE — Telephone Encounter (Signed)
Pt at ER

## 2016-03-22 ENCOUNTER — Telehealth: Payer: Self-pay | Admitting: Family Medicine

## 2016-03-22 DIAGNOSIS — S52502D Unspecified fracture of the lower end of left radius, subsequent encounter for closed fracture with routine healing: Secondary | ICD-10-CM

## 2016-03-22 NOTE — Telephone Encounter (Signed)
Patient was referred to Ortho by ED, she needs a referral to be sent to an Ortho who is in the [facility] network, the one she was referred to does not accept orange card.

## 2016-03-23 NOTE — Telephone Encounter (Signed)
Ortho referral placed

## 2016-04-12 ENCOUNTER — Encounter: Payer: Self-pay | Admitting: Nurse Practitioner

## 2016-04-12 ENCOUNTER — Ambulatory Visit (INDEPENDENT_AMBULATORY_CARE_PROVIDER_SITE_OTHER): Payer: No Typology Code available for payment source | Admitting: Nurse Practitioner

## 2016-04-12 VITALS — BP 179/95 | HR 76 | Temp 97.3°F | Ht 66.0 in | Wt 112.0 lb

## 2016-04-12 DIAGNOSIS — R1310 Dysphagia, unspecified: Secondary | ICD-10-CM

## 2016-04-12 DIAGNOSIS — B182 Chronic viral hepatitis C: Secondary | ICD-10-CM

## 2016-04-12 NOTE — Assessment & Plan Note (Signed)
She informs me that she stopped taking her hepatitis C medication in March with 2 weeks of treatment remaining due to dysphagia and odynophagia and "I'm tired of regurgitating those pills back up." Strongly encouraged her to reach out to her infectious disease provider. I have also reached out to them to ensure they are aware.

## 2016-04-12 NOTE — Progress Notes (Signed)
cc'ed to pcp °

## 2016-04-12 NOTE — Assessment & Plan Note (Signed)
Solid food dysphagia especially with meats as well as pill dysphagia. Difficulty keeping pills down and frequently regurgitates them. She is not taking her Protonix regularly because of this. Symptoms improved with soft foods. Recommend she continue soft foods and we will proceed with upper endoscopy with possible dilation.  Of note her last colonoscopy was also approximately 6-8 years ago although she is not sure when. She does not remember the results and does not remember when she needs to be repeated. She is agreeable to adding colonoscopy to her upper endoscopy depending on results records that we will request. I will have staff hold a slot for double for her and based on records to receive we will schedule appropriately.  Proceed with EGD +/- dilation as well as possible TCS (pending records documenting need for repeat screening) with Dr. Gala Romney in near future: the risks, benefits, and alternatives have been discussed with the patient in detail. The patient states understanding and desires to proceed.  The patient is not on any anticoagulants. She is currently on Xanax, Prozac, Percocet. History of alcohol abuse, currently drinks a sixpack of beer a week. We will plan for the procedure on propofol to promote adequate sedation.

## 2016-04-12 NOTE — Assessment & Plan Note (Signed)
In addition to dysphagia the patient also notes odynophagia especially with pills. Has had a previous upper endoscopy with dilation at [facility] gastroenterology approximately 6-8 years ago although she is not sure exactly when. Records requested. We will proceed with upper endoscopy with possible dilation as well as possible colonoscopy pending records documenting need for repeat screening, as noted above. Return for follow-up based on postprocedure recommendations.

## 2016-04-12 NOTE — Progress Notes (Signed)
Primary Care Physician:  Minerva Ends, MD Primary Gastroenterologist:  Dr. Gala Romney  Chief Complaint  Patient presents with  . Dysphagia    HPI:   Kara Bailey is a 60 y.o. female who presents on referral from primary care for dysphagia and odynophagia. PCP notes reviewed, this is a persistent problem for 15 years. Had upper endoscopy and esophageal dilation about 6-8 years ago likely at [facility] gastroenterology. Symptoms improve for 1 or 2 years afterward than had some intermittent return of symptoms with a UTI swallow evaluation in 2014 that was normal except for hiatal hernia. Worsening dysphagia and odynophagia symptoms over the past year, particularly with meats. PCP also notes history of alcohol abuse currently drinks a sixpack of beer a week, has persistent stomach pain for which she takes naproxen 1-2 times a day. She is currently on a PPI.  Today she states her dysphagia has gotten worse over the past year. Worse with pills and meats. Pills will come back up, especially big pills. Denies abdominal pain, vomiting. Will have nausea and odynophagia with her dysphagia. Symptoms occur with all meat consumption. Has been trying to eat softer foods. Denies hematochezia, melena, fever, chills. Has lost some minimal weight (2-3 lbs) due to not able to eat normally with her symptoms. Admits GERD symptoms, currently on Protonix but unable to take it every day due to pill dysphagia as noted above. Denies chest pain, dyspnea, dizziness, lightheadedness, syncope, near syncope. Denies any other upper or lower GI symptoms.  Is currnetly being treated for HCV, last RNA quant was undetectable, a couple weeks remain on treatment.  Past Medical History  Diagnosis Date  . Hepatitis C   . Shingles     left side  . Hepatitis C virus infection   . Palpitations   . PAC (premature atrial contraction) 10/11/2014  . PVC (premature ventricular contraction) 10/11/2014  . Dyspnea 08/2014  . Chest pain  08/2014    unspecified  . Tachycardia 08/2014  . Dizziness 08/2014  . Weakness 08/2014    Past Surgical History  Procedure Laterality Date  . Appendectomy    . Left heart catheterization with coronary angiogram N/A 10/29/2014    Procedure: LEFT HEART CATHETERIZATION WITH CORONARY ANGIOGRAM;  Surgeon: Burnell Blanks, MD;  Location: [facility] CATH LAB;  Service: Cardiovascular;  Laterality: N/A;  . Esophagogastroduodenoscopy      approximately 2010    Current Outpatient Prescriptions  Medication Sig Dispense Refill  . ALPRAZolam (XANAX) 0.5 MG tablet Take 1 tablet (0.5 mg total) by mouth at bedtime as needed for anxiety. 30 tablet 2  . FLUoxetine (PROZAC) 20 MG tablet Take 1 tablet (20 mg total) by mouth daily. 30 tablet 3  . metoprolol succinate (TOPROL-XL) 100 MG 24 hr tablet Take 1 tablet (100 mg total) by mouth daily. Take with or immediately following a meal. 30 tablet 5  . naproxen (NAPROSYN) 500 MG tablet Take 1 tablet (500 mg total) by mouth 2 (two) times daily with a meal. 30 tablet 0  . oxyCODONE-acetaminophen (PERCOCET/ROXICET) 5-325 MG tablet Take 1-2 tablets by mouth every 6 (six) hours as needed for severe pain. 20 tablet 0  . pantoprazole (PROTONIX) 40 MG tablet Take 1 tablet (40 mg total) by mouth daily. 30 tablet 5   No current facility-administered medications for this visit.    Allergies as of 04/12/2016  . (No Known Allergies)    Family History  Problem Relation Age of Onset  . Cancer Neg Hx   .  Heart failure Mother   . Heart attack Mother   . Heart disease Mother   . Hypertension Mother   . Hyperlipidemia Mother   . CVA Father   . Alzheimer's disease Father   . Colon cancer Neg Hx     Social History   Social History  . Marital Status: Divorced    Spouse Name: N/A  . Number of Children: N/A  . Years of Education: N/A   Occupational History  . Not on file.   Social History Main Topics  . Smoking status: Current Every Day Smoker -- 0.25  packs/day    Types: Cigarettes    Start date: 11/12/1968  . Smokeless tobacco: Never Used     Comment: cutting back, wearing patches  . Alcohol Use: 0.0 oz/week    0 Standard drinks or equivalent per week     Comment: beer on weekends (6-pack)  . Drug Use: No  . Sexual Activity: Not on file   Other Topics Concern  . Not on file   Social History Narrative    Review of Systems: General: Negative for anorexia, weight loss, fever, chills, fatigue, weakness. ENT: Negative for hoarseness. Admits dysphagia and odynophagia. CV: Negative for chest pain, angina, palpitations, peripheral edema.  Respiratory: Negative for dyspnea at rest, cough, sputum, wheezing.  GI: See history of present illness. Derm: Negative for rash or itching.  Neuro: Negative for memory loss, confusion.  Endo: Negative for unusual weight change.  Heme: Negative for bruising or bleeding. Allergy: Negative for rash or hives.    Physical Exam: BP 179/95 mmHg  Pulse 76  Temp(Src) 97.3 F (36.3 C) (Oral)  Ht 5\' 6"  (1.676 m)  Wt 112 lb (50.803 kg)  BMI 18.09 kg/m2 General:   Alert and oriented. Pleasant and cooperative. Well-nourished and well-developed.  Head:  Normocephalic and atraumatic. Eyes:  Without icterus, sclera clear and conjunctiva pink.  Ears:  Normal auditory acuity. Cardiovascular:  S1, S2 present without murmurs appreciated. Extremities without clubbing or edema. Respiratory:  Clear to auscultation bilaterally. No wheezes, rales, or rhonchi. No distress.  Gastrointestinal:  +BS, soft, non-tender and non-distended. No HSM noted. No guarding or rebound. No masses appreciated.  Rectal:  Deferred  Musculoskalatal:  Symmetrical without gross deformities. Neurologic:  Alert and oriented x4;  grossly normal neurologically. Psych:  Alert and cooperative. Normal mood and affect. Heme/Lymph/Immune: No excessive bruising noted.    04/12/2016 10:00 AM   Disclaimer: This note was dictated with voice  recognition software. Similar sounding words can inadvertently be transcribed and may not be corrected upon review.

## 2016-04-12 NOTE — Patient Instructions (Signed)
1. We will schedule your upper endoscopy for you. 2. We will request your previous colonoscopy and upper endoscopy records from [facility] in [place]. 3. If it is time for a repeat colonoscopy we will schedule this for the same time. 4. We will let you know whether you're having just the upper or having both the upper and lower colonoscopy. 5. It is very important that she notify your provider who treats hepatitis C of your difficulties with pills. 6. Return for follow-up based on postprocedure recommendations.

## 2016-04-25 ENCOUNTER — Telehealth: Payer: Self-pay | Admitting: Nurse Practitioner

## 2016-04-25 ENCOUNTER — Other Ambulatory Visit: Payer: Self-pay

## 2016-04-25 MED ORDER — PEG 3350-KCL-NA BICARB-NACL 420 G PO SOLR: 4000.0000 mL | ORAL | Status: DC

## 2016-04-25 NOTE — Telephone Encounter (Signed)
Pt will come by the office tomorrow to pick up prep and instructions. She is aware of her prep-op on 04/26/16 @ 2:45 pm

## 2016-04-25 NOTE — Patient Instructions (Signed)
Kara Bailey  04/25/2016     @PREFPERIOPPHARMACY @   Your procedure is scheduled on  04/30/2016   Report to [facility]  at  1120  A.M.  Call this number if you have problems the morning of surgery:  [phone]   Remember:  Do not eat food or drink liquids after midnight.  Take these medicines the morning of surgery with A SIP OF WATER  Xanax, prozac, metoprolol, oxycodone, protonix.   Do not wear jewelry, make-up or nail polish.  Do not wear lotions, powders, or perfumes.  You may wear deodorant.  Do not shave 48 hours prior to surgery.  Men may shave face and neck.  Do not bring valuables to the hospital.  [facility] is not responsible for any belongings or valuables.  Contacts, dentures or bridgework may not be worn into surgery.  Leave your suitcase in the car.  After surgery it may be brought to your room.  For patients admitted to the hospital, discharge time will be determined by your treatment team.  Patients discharged the day of surgery will not be allowed to drive home.   Name and phone number of your driver:   family Special instructions:  Follow the diet and prep instructions given to you by Dr Roseanne Kaufman office.  Please read over the following fact sheets that you were given. Coughing and Deep Breathing, Surgical Site Infection Prevention, Anesthesia Post-op Instructions and Care and Recovery After Surgery      Esophagogastroduodenoscopy Esophagogastroduodenoscopy (EGD) is a procedure that is used to examine the lining of the esophagus, stomach, and first part of the small intestine (duodenum). A long, flexible, lighted tube with a camera attached (endoscope) is inserted down the throat to view these organs. This procedure is done to detect problems or abnormalities, such as inflammation, bleeding, ulcers, or growths, in order to treat them. The procedure lasts 5-20 minutes. It is usually an outpatient procedure, but it may need to be  performed in a hospital in emergency cases. LET [facility] CARE PROVIDER KNOW ABOUT:  Any allergies you have.  All medicines you are taking, including vitamins, herbs, eye drops, creams, and over-the-counter medicines.  Previous problems you or members of your family have had with the use of anesthetics.  Any blood disorders you have.  Previous surgeries you have had.  Medical conditions you have. RISKS AND COMPLICATIONS Generally, this is a safe procedure. However, problems can occur and include:  Infection.  Bleeding.  Tearing (perforation) of the esophagus, stomach, or duodenum.  Difficulty breathing or not being able to breathe.  Excessive sweating.  Spasms of the larynx.  Slowed heartbeat.  Low blood pressure. BEFORE THE PROCEDURE  Do not eat or drink anything after midnight on the night before the procedure or as directed by your health care provider.  Do not take your regular medicines before the procedure if your health care provider asks you not to. Ask your health care provider about changing or stopping those medicines.  If you wear dentures, be prepared to remove them before the procedure.  Arrange for someone to drive you home after the procedure. PROCEDURE  A numbing medicine (local anesthetic) may be sprayed in your throat for comfort and to stop you from gagging or coughing.  You will have an IV tube inserted in a vein in your hand or arm. You will receive medicines and fluids through this  tube.  You will be given a medicine to relax you (sedative).  A pain reliever will be given through the IV tube.  A mouth guard may be placed in your mouth to protect your teeth and to keep you from biting on the endoscope.  You will be asked to lie on your left side.  The endoscope will be inserted down your throat and into your esophagus, stomach, and duodenum.  Air will be put through the endoscope to allow your health care provider to clearly view the  lining of your esophagus.  The lining of your esophagus, stomach, and duodenum will be examined. During the exam, your health care provider may:  Remove tissue to be examined under a microscope (biopsy) for inflammation, infection, or other medical problems.  Remove growths.  Remove objects (foreign bodies) that are stuck.  Treat any bleeding with medicines or other devices that stop tissues from bleeding (hot cautery, clipping devices).  Widen (dilate) or stretch narrowed areas of your esophagus and stomach.  The endoscope will be withdrawn. AFTER THE PROCEDURE  You will be taken to a recovery area for observation. Your blood pressure, heart rate, breathing rate, and blood oxygen level will be monitored often until the medicines you were given have worn off.  Do not eat or drink anything until the numbing medicine has worn off and your gag reflex has returned. You may choke.  Your health care provider should be able to discuss his or her findings with you. It will take longer to discuss the test results if any biopsies were taken.   This information is not intended to replace advice given to you by your health care provider. Make sure you discuss any questions you have with your health care provider.   Document Released: 03/01/2005 Document Revised: 11/19/2014 Document Reviewed: 10/01/2012 Elsevier Interactive Patient Education 2016 [place]. Esophagogastroduodenoscopy, Care After Refer to this sheet in the next few weeks. These instructions provide you with information about caring for yourself after your procedure. Your health care provider may also give you more specific instructions. Your treatment has been planned according to current medical practices, but problems sometimes occur. Call your health care provider if you have any problems or questions after your procedure. WHAT TO EXPECT AFTER THE PROCEDURE After your procedure, it is typical to feel:  Soreness in your  throat.  Pain with swallowing.  Sick to your stomach (nauseous).  Bloated.  Dizzy.  Fatigued. HOME CARE INSTRUCTIONS  Do not eat or drink anything until the numbing medicine (local anesthetic) has worn off and your gag reflex has returned. You will know that the local anesthetic has worn off when you can swallow comfortably.  Do not drive or operate machinery until directed by your health care provider.  Take medicines only as directed by your health care provider. SEEK MEDICAL CARE IF:   You cannot stop coughing.  You are not urinating at all or less than usual. SEEK IMMEDIATE MEDICAL CARE IF:  You have difficulty swallowing.  You cannot eat or drink.  You have worsening throat or chest pain.  You have dizziness or lightheadedness or you faint.  You have nausea or vomiting.  You have chills.  You have a fever.  You have severe abdominal pain.  You have black, tarry, or bloody stools.   This information is not intended to replace advice given to you by your health care provider. Make sure you discuss any questions you have with your health care provider.  Document Released: 10/15/2012 Document Revised: 11/19/2014 Document Reviewed: 10/15/2012 Elsevier Interactive Patient Education 2016 [employer] Esophageal Dilatation Esophageal dilatation is a procedure to open a blocked or narrowed part of the esophagus. The esophagus is the long tube in your throat that carries food and liquid from your mouth to your stomach. The procedure is also called esophageal dilation.  You may need this procedure if you have a buildup of scar tissue in your esophagus that makes it difficult, painful, or even impossible to swallow. This can be caused by gastroesophageal reflux disease (GERD). In rare cases, people need this procedure because they have cancer of the esophagus or a problem with the way food moves through the esophagus. Sometimes you may need to have another dilatation to  enlarge the opening of the esophagus gradually. LET [facility] CARE PROVIDER KNOW ABOUT:   Any allergies you have.  All medicines you are taking, including vitamins, herbs, eye drops, creams, and over-the-counter medicines.  Previous problems you or members of your family have had with the use of anesthetics.  Any blood disorders you have.  Previous surgeries you have had.  Medical conditions you have.  Any antibiotic medicines you are required to take before dental procedures. RISKS AND COMPLICATIONS Generally, this is a safe procedure. However, problems can occur and include:  Bleeding from a tear in the lining of the esophagus.  A hole (perforation) in the esophagus. BEFORE THE PROCEDURE  Do not eat or drink anything after midnight on the night before the procedure or as directed by your health care provider.  Ask your health care provider about changing or stopping your regular medicines. This is especially important if you are taking diabetes medicines or blood thinners.  Plan to have someone take you home after the procedure. PROCEDURE   You will be given a medicine that makes you relaxed and sleepy (sedative).  A medicine may be sprayed or gargled to numb the back of the throat.  Your health care provider can use various instruments to do an esophageal dilatation. During the procedure, the instrument used will be placed in your mouth and passed down into your esophagus. Options include:  Simple dilators. This instrument is carefully placed in the esophagus to stretch it.  Guided wire bougies. In this method, a flexible tube (endoscope) is used to insert a wire into the esophagus. The dilator is passed over this wire to enlarge the esophagus. Then the wire is removed.  Balloon dilators. An endoscope with a small balloon at the end is passed down into the esophagus. Inflating the balloon gently stretches the esophagus and opens it up. AFTER THE PROCEDURE  Your blood  pressure, heart rate, breathing rate, and blood oxygen level will be monitored often until the medicines you were given have worn off.  Your throat may feel slightly sore and will probably still feel numb. This will improve slowly over time.  You will not be allowed to eat or drink until the throat numbness has resolved.  If this is a same-day procedure, you may be allowed to go home once you have been able to drink, urinate, and sit on the edge of the bed without nausea or dizziness.  If this is a same-day procedure, you should have a friend or family member with you for the next 24 hours after the procedure.   This information is not intended to replace advice given to you by your health care provider. Make sure you discuss any questions you have  with your health care provider.   Document Released: 12/20/2005 Document Revised: 11/19/2014 Document Reviewed: 03/10/2014 Elsevier Interactive Patient Education 2016 [employer]. Colonoscopy A colonoscopy is an exam to look at the entire large intestine (colon). This exam can help find problems such as tumors, polyps, inflammation, and areas of bleeding. The exam takes about 1 hour.  LET [facility] CARE PROVIDER KNOW ABOUT:   Any allergies you have.  All medicines you are taking, including vitamins, herbs, eye drops, creams, and over-the-counter medicines.  Previous problems you or members of your family have had with the use of anesthetics.  Any blood disorders you have.  Previous surgeries you have had.  Medical conditions you have. RISKS AND COMPLICATIONS  Generally, this is a safe procedure. However, as with any procedure, complications can occur. Possible complications include:  Bleeding.  Tearing or rupture of the colon wall.  Reaction to medicines given during the exam.  Infection (rare). BEFORE THE PROCEDURE   Ask your health care provider about changing or stopping your regular medicines.  You may be prescribed an  oral bowel prep. This involves drinking a large amount of medicated liquid, starting the day before your procedure. The liquid will cause you to have multiple loose stools until your stool is almost clear or light green. This cleans out your colon in preparation for the procedure.  Do not eat or drink anything else once you have started the bowel prep, unless your health care provider tells you it is safe to do so.  Arrange for someone to drive you home after the procedure. PROCEDURE   You will be given medicine to help you relax (sedative).  You will lie on your side with your knees bent.  A long, flexible tube with a light and camera on the end (colonoscope) will be inserted through the rectum and into the colon. The camera sends video back to a computer screen as it moves through the colon. The colonoscope also releases carbon dioxide gas to inflate the colon. This helps your health care provider see the area better.  During the exam, your health care provider may take a small tissue sample (biopsy) to be examined under a microscope if any abnormalities are found.  The exam is finished when the entire colon has been viewed. AFTER THE PROCEDURE   Do not drive for 24 hours after the exam.  You may have a small amount of blood in your stool.  You may pass moderate amounts of gas and have mild abdominal cramping or bloating. This is caused by the gas used to inflate your colon during the exam.  Ask when your test results will be ready and how you will get your results. Make sure you get your test results.   This information is not intended to replace advice given to you by your health care provider. Make sure you discuss any questions you have with your health care provider.   Document Released: 10/26/2000 Document Revised: 08/19/2013 Document Reviewed: 07/06/2013 Elsevier Interactive Patient Education 2016 [employer] Colonoscopy, Care After Refer to this sheet in the next few  weeks. These instructions provide you with information on caring for yourself after your procedure. Your health care provider may also give you more specific instructions. Your treatment has been planned according to current medical practices, but problems sometimes occur. Call your health care provider if you have any problems or questions after your procedure. WHAT TO EXPECT AFTER THE PROCEDURE  After your procedure, it is typical  to have the following:  A small amount of blood in your stool.  Moderate amounts of gas and mild abdominal cramping or bloating. HOME CARE INSTRUCTIONS  Do not drive, operate machinery, or sign important documents for 24 hours.  You may shower and resume your regular physical activities, but move at a slower pace for the first 24 hours.  Take frequent rest periods for the first 24 hours.  Walk around or put a warm pack on your abdomen to help reduce abdominal cramping and bloating.  Drink enough fluids to keep your urine clear or pale yellow.  You may resume your normal diet as instructed by your health care provider. Avoid heavy or fried foods that are hard to digest.  Avoid drinking alcohol for 24 hours or as instructed by your health care provider.  Only take over-the-counter or prescription medicines as directed by your health care provider.  If a tissue sample (biopsy) was taken during your procedure:  Do not take aspirin or blood thinners for 7 days, or as instructed by your health care provider.  Do not drink alcohol for 7 days, or as instructed by your health care provider.  Eat soft foods for the first 24 hours. SEEK MEDICAL CARE IF: You have persistent spotting of blood in your stool 2-3 days after the procedure. SEEK IMMEDIATE MEDICAL CARE IF:  You have more than a small spotting of blood in your stool.  You pass large blood clots in your stool.  Your abdomen is swollen (distended).  You have nausea or vomiting.  You have a  fever.  You have increasing abdominal pain that is not relieved with medicine.   This information is not intended to replace advice given to you by your health care provider. Make sure you discuss any questions you have with your health care provider.   Document Released: 06/12/2004 Document Revised: 08/19/2013 Document Reviewed: 07/06/2013 Elsevier Interactive Patient Education 2016 [employer] PATIENT INSTRUCTIONS POST-ANESTHESIA  IMMEDIATELY FOLLOWING SURGERY:  Do not drive or operate machinery for the first twenty four hours after surgery.  Do not make any important decisions for twenty four hours after surgery or while taking narcotic pain medications or sedatives.  If you develop intractable nausea and vomiting or a severe headache please notify your doctor immediately.  FOLLOW-UP:  Please make an appointment with your surgeon as instructed. You do not need to follow up with anesthesia unless specifically instructed to do so.  WOUND CARE INSTRUCTIONS (if applicable):  Keep a dry clean dressing on the anesthesia/puncture wound site if there is drainage.  Once the wound has quit draining you may leave it open to air.  Generally you should leave the bandage intact for twenty four hours unless there is drainage.  If the epidural site drains for more than 36-48 hours please call the anesthesia department.  QUESTIONS?:  Please feel free to call your physician or the hospital operator if you have any questions, and they will be happy to assist you.

## 2016-04-25 NOTE — Telephone Encounter (Signed)
Previous colonoscopy records received and reviewed. Last TCS 08/09/2008 per records which found a single 4 mm polyp in the rectum which was found to be tubular adenoma on surgical pathology. Recommended repeat colonoscopy in 5 years (September 2014). She is currently due for colonoscopy. We will arrange to have this performed at same time as her endoscopy per her last office visit note. The risks and benefits were discussed at her office visit in anticipation of possible colonoscopy.  Reason of the patient that she is due for a repeat colonoscopy and we can add this on to her endoscopy that she is scheduled for next week.

## 2016-04-26 ENCOUNTER — Other Ambulatory Visit: Payer: Self-pay

## 2016-04-26 ENCOUNTER — Encounter (HOSPITAL_COMMUNITY): Payer: Self-pay

## 2016-04-26 ENCOUNTER — Encounter (HOSPITAL_COMMUNITY)
Admission: RE | Admit: 2016-04-26 | Discharge: 2016-04-26 | Disposition: A | Discharge status: Home / Self Care | Payer: Self-pay | Source: Ambulatory Visit | Attending: Internal Medicine | Admitting: Internal Medicine

## 2016-04-26 DIAGNOSIS — R1310 Dysphagia, unspecified: Secondary | ICD-10-CM | POA: Insufficient documentation

## 2016-04-26 DIAGNOSIS — Z01810 Encounter for preprocedural cardiovascular examination: Secondary | ICD-10-CM | POA: Insufficient documentation

## 2016-04-26 DIAGNOSIS — Z01812 Encounter for preprocedural laboratory examination: Secondary | ICD-10-CM | POA: Insufficient documentation

## 2016-04-26 HISTORY — DX: Gastro-esophageal reflux disease without esophagitis: K21.9

## 2016-04-26 HISTORY — DX: Essential (primary) hypertension: I10

## 2016-04-26 HISTORY — DX: Anxiety disorder, unspecified: F41.9

## 2016-04-26 LAB — CBC WITH DIFFERENTIAL/PLATELET
BASOS ABS: 0 10*3/uL (ref 0.0–0.1)
BASOS PCT: 0 %
EOS ABS: 0.1 10*3/uL (ref 0.0–0.7)
Eosinophils Relative: 1 %
HEMATOCRIT: 44.8 % (ref 36.0–46.0)
HEMOGLOBIN: 15.5 g/dL — AB (ref 12.0–15.0)
Lymphocytes Relative: 39 %
Lymphs Abs: 2.8 10*3/uL (ref 0.7–4.0)
MCH: 34.4 pg — ABNORMAL HIGH (ref 26.0–34.0)
MCHC: 34.6 g/dL (ref 30.0–36.0)
MCV: 99.3 fL (ref 78.0–100.0)
Monocytes Absolute: 0.5 10*3/uL (ref 0.1–1.0)
Monocytes Relative: 7 %
NEUTROS ABS: 3.8 10*3/uL (ref 1.7–7.7)
NEUTROS PCT: 53 %
Platelets: 209 10*3/uL (ref 150–400)
RBC: 4.51 MIL/uL (ref 3.87–5.11)
RDW: 12.6 % (ref 11.5–15.5)
WBC: 7.2 10*3/uL (ref 4.0–10.5)

## 2016-04-26 LAB — BASIC METABOLIC PANEL
ANION GAP: 7 (ref 5–15)
BUN: <5 mg/dL — ABNORMAL LOW (ref 6–20)
CHLORIDE: 100 mmol/L — AB (ref 101–111)
CO2: 22 mmol/L (ref 22–32)
Calcium: 9.4 mg/dL (ref 8.9–10.3)
Creatinine, Ser: 0.84 mg/dL (ref 0.44–1.00)
GFR calc non Af Amer: >60 mL/min (ref >60)
Glucose, Bld: 75 mg/dL (ref 65–99)
Potassium: 4.2 mmol/L (ref 3.5–5.1)
SODIUM: 129 mmol/L — AB (ref 135–145)

## 2016-04-27 ENCOUNTER — Other Ambulatory Visit: Payer: Self-pay

## 2016-04-27 DIAGNOSIS — B182 Chronic viral hepatitis C: Secondary | ICD-10-CM

## 2016-04-27 LAB — COMPLETE METABOLIC PANEL WITH GFR
ALBUMIN: 3.9 g/dL (ref 3.6–5.1)
ALK PHOS: 90 U/L (ref 33–130)
ALT: 14 U/L (ref 6–29)
AST: 27 U/L (ref 10–35)
BILIRUBIN TOTAL: 0.6 mg/dL (ref 0.2–1.2)
BUN: 5 mg/dL — ABNORMAL LOW (ref 7–25)
CO2: 20 mmol/L (ref 20–31)
Calcium: 9.5 mg/dL (ref 8.6–10.4)
Chloride: 102 mmol/L (ref 98–110)
Creat: 0.74 mg/dL (ref 0.50–0.99)
GFR, EST NON AFRICAN AMERICAN: 87 mL/min (ref >=60)
Glucose, Bld: 72 mg/dL (ref 65–99)
Potassium: 4 mmol/L (ref 3.5–5.3)
Sodium: 131 mmol/L — ABNORMAL LOW (ref 135–146)
TOTAL PROTEIN: 6.7 g/dL (ref 6.1–8.1)

## 2016-04-30 ENCOUNTER — Ambulatory Visit (HOSPITAL_COMMUNITY): Payer: Self-pay | Admitting: Anesthesiology

## 2016-04-30 ENCOUNTER — Encounter (HOSPITAL_COMMUNITY): Payer: Self-pay | Admitting: *Deleted

## 2016-04-30 ENCOUNTER — Ambulatory Visit (HOSPITAL_COMMUNITY)
Admission: RE | Admit: 2016-04-30 | Discharge: 2016-04-30 | Disposition: A | Discharge status: Home Health | Payer: Self-pay | Source: Ambulatory Visit | Attending: Internal Medicine | Admitting: Internal Medicine

## 2016-04-30 ENCOUNTER — Encounter (HOSPITAL_COMMUNITY)
Admission: RE | Disposition: A | Discharge status: Home Health | Payer: Self-pay | Source: Ambulatory Visit | Attending: Internal Medicine

## 2016-04-30 DIAGNOSIS — Z1211 Encounter for screening for malignant neoplasm of colon: Secondary | ICD-10-CM | POA: Insufficient documentation

## 2016-04-30 DIAGNOSIS — K210 Gastro-esophageal reflux disease with esophagitis: Secondary | ICD-10-CM | POA: Insufficient documentation

## 2016-04-30 DIAGNOSIS — Z86010 Personal history of colonic polyps: Secondary | ICD-10-CM

## 2016-04-30 DIAGNOSIS — K222 Esophageal obstruction: Secondary | ICD-10-CM

## 2016-04-30 DIAGNOSIS — F419 Anxiety disorder, unspecified: Secondary | ICD-10-CM | POA: Insufficient documentation

## 2016-04-30 DIAGNOSIS — R1310 Dysphagia, unspecified: Secondary | ICD-10-CM

## 2016-04-30 DIAGNOSIS — Z791 Long term (current) use of non-steroidal anti-inflammatories (NSAID): Secondary | ICD-10-CM | POA: Insufficient documentation

## 2016-04-30 DIAGNOSIS — Z79899 Other long term (current) drug therapy: Secondary | ICD-10-CM | POA: Insufficient documentation

## 2016-04-30 DIAGNOSIS — I10 Essential (primary) hypertension: Secondary | ICD-10-CM | POA: Insufficient documentation

## 2016-04-30 DIAGNOSIS — K449 Diaphragmatic hernia without obstruction or gangrene: Secondary | ICD-10-CM

## 2016-04-30 DIAGNOSIS — D125 Benign neoplasm of sigmoid colon: Secondary | ICD-10-CM

## 2016-04-30 DIAGNOSIS — F17210 Nicotine dependence, cigarettes, uncomplicated: Secondary | ICD-10-CM | POA: Insufficient documentation

## 2016-04-30 HISTORY — PX: COLONOSCOPY WITH PROPOFOL: SHX5780

## 2016-04-30 HISTORY — PX: ESOPHAGOGASTRODUODENOSCOPY (EGD) WITH PROPOFOL: SHX5813

## 2016-04-30 HISTORY — PX: POLYPECTOMY: SHX5525

## 2016-04-30 HISTORY — PX: MALONEY DILATION: SHX5535

## 2016-04-30 LAB — HEPATITIS C RNA QUANTITATIVE: HCV QUANT: NOT DETECTED [IU]/mL (ref <15)

## 2016-04-30 SURGERY — COLONOSCOPY WITH PROPOFOL
Anesthesia: Monitor Anesthesia Care

## 2016-04-30 MED ORDER — MIDAZOLAM HCL 2 MG/2ML IJ SOLN
1.0000 mg | INTRAMUSCULAR | Status: DC | PRN
  Administered 2016-04-30 (×2): 2 mg via INTRAVENOUS
  Filled 2016-04-30: qty 2

## 2016-04-30 MED ORDER — FENTANYL CITRATE (PF) 100 MCG/2ML IJ SOLN
25.0000 ug | INTRAMUSCULAR | Status: AC
  Administered 2016-04-30 (×2): 25 ug via INTRAVENOUS

## 2016-04-30 MED ORDER — LIDOCAINE VISCOUS 2 % MT SOLN
3.0000 mL | Freq: Once | OROMUCOSAL | Status: AC
  Administered 2016-04-30: 3 mL via OROMUCOSAL

## 2016-04-30 MED ORDER — FENTANYL CITRATE (PF) 100 MCG/2ML IJ SOLN
INTRAMUSCULAR | Status: AC
  Filled 2016-04-30: qty 2

## 2016-04-30 MED ORDER — MIDAZOLAM HCL 5 MG/5ML IJ SOLN
INTRAMUSCULAR | Status: DC | PRN
  Administered 2016-04-30 (×2): 1 mg via INTRAVENOUS

## 2016-04-30 MED ORDER — PROPOFOL 500 MG/50ML IV EMUL
INTRAVENOUS | Status: DC | PRN
  Administered 2016-04-30: 75 ug/kg/min via INTRAVENOUS

## 2016-04-30 MED ORDER — LACTATED RINGERS IV SOLN
INTRAVENOUS | Status: DC
  Administered 2016-04-30: 11:00:00 via INTRAVENOUS

## 2016-04-30 MED ORDER — PROPOFOL 10 MG/ML IV BOLUS
INTRAVENOUS | Status: DC | PRN
  Administered 2016-04-30 (×2): 5 mg via INTRAVENOUS

## 2016-04-30 MED ORDER — LIDOCAINE HCL (CARDIAC) 10 MG/ML IV SOLN
INTRAVENOUS | Status: DC | PRN
  Administered 2016-04-30: 50 mg via INTRAVENOUS

## 2016-04-30 MED ORDER — ONDANSETRON HCL 4 MG/2ML IJ SOLN
INTRAMUSCULAR | Status: AC
  Filled 2016-04-30: qty 2

## 2016-04-30 MED ORDER — MIDAZOLAM HCL 2 MG/2ML IJ SOLN
INTRAMUSCULAR | Status: AC
  Filled 2016-04-30: qty 2

## 2016-04-30 MED ORDER — LIDOCAINE VISCOUS 2 % MT SOLN
OROMUCOSAL | Status: AC
  Filled 2016-04-30: qty 15

## 2016-04-30 MED ORDER — ONDANSETRON HCL 4 MG/2ML IJ SOLN
4.0000 mg | Freq: Once | INTRAMUSCULAR | Status: AC
  Administered 2016-04-30: 4 mg via INTRAVENOUS

## 2016-04-30 NOTE — Discharge Instructions (Signed)
GERD and colonic polyp information provided  Increase Protonix to 40 mg twice daily  Further recommendations to follow pending review of pathology report  Office visit with [place] in 12 weeks. Follow up Tuesday, September 19 at 8 am   Colonoscopy Discharge Instructions  Read the instructions outlined below and refer to this sheet in the next few weeks. These discharge instructions provide you with general information on caring for yourself after you leave the hospital. Your doctor may also give you specific instructions. While your treatment has been planned according to the most current medical practices available, unavoidable complications occasionally occur. If you have any problems or questions after discharge, call Dr. Gala Romney at [phone]. ACTIVITY  You may resume your regular activity, but move at a slower pace for the next 24 hours.   Take frequent rest periods for the next 24 hours.   Walking will help get rid of the air and reduce the bloated feeling in your belly (abdomen).   No driving for 24 hours (because of the medicine (anesthesia) used during the test).    Do not sign any important legal documents or operate any machinery for 24 hours (because of the anesthesia used during the test).  NUTRITION  Drink plenty of fluids.   You may resume your normal diet as instructed by your doctor.   Begin with a light meal and progress to your normal diet. Heavy or fried foods are harder to digest and may make you feel sick to your stomach (nauseated).   Avoid alcoholic beverages for 24 hours or as instructed.  MEDICATIONS  You may resume your normal medications unless your doctor tells you otherwise.  WHAT YOU CAN EXPECT TODAY  Some feelings of bloating in the abdomen.   Passage of more gas than usual.   Spotting of blood in your stool or on the toilet paper.  IF YOU HAD POLYPS REMOVED DURING THE COLONOSCOPY:  No aspirin products for 7 days or as instructed.   No alcohol for  7 days or as instructed.   Eat a soft diet for the next 24 hours.  FINDING OUT THE RESULTS OF YOUR TEST Not all test results are available during your visit. If your test results are not back during the visit, make an appointment with your caregiver to find out the results. Do not assume everything is normal if you have not heard from your caregiver or the medical facility. It is important for you to follow up on all of your test results.  SEEK IMMEDIATE MEDICAL ATTENTION IF:  You have more than a spotting of blood in your stool.   Your belly is swollen (abdominal distention).   You are nauseated or vomiting.   You have a temperature over 101.   You have abdominal pain or discomfort that is severe or gets worse throughout the day.   EGD Discharge instructions Please read the instructions outlined below and refer to this sheet in the next few weeks. These discharge instructions provide you with general information on caring for yourself after you leave the hospital. Your doctor may also give you specific instructions. While your treatment has been planned according to the most current medical practices available, unavoidable complications occasionally occur. If you have any problems or questions after discharge, please call your doctor. ACTIVITY  You may resume your regular activity but move at a slower pace for the next 24 hours.   Take frequent rest periods for the next 24 hours.   Walking will  help expel (get rid of) the air and reduce the bloated feeling in your abdomen.   No driving for 24 hours (because of the anesthesia (medicine) used during the test).   You may shower.   Do not sign any important legal documents or operate any machinery for 24 hours (because of the anesthesia used during the test).  NUTRITION  Drink plenty of fluids.   You may resume your normal diet.   Begin with a light meal and progress to your normal diet.   Avoid alcoholic beverages for 24 hours  or as instructed by your caregiver.  MEDICATIONS  You may resume your normal medications unless your caregiver tells you otherwise.  WHAT YOU CAN EXPECT TODAY  You may experience abdominal discomfort such as a feeling of fullness or gas pains.  FOLLOW-UP  Your doctor will discuss the results of your test with you.  SEEK IMMEDIATE MEDICAL ATTENTION IF ANY OF THE FOLLOWING OCCUR:  Excessive nausea (feeling sick to your stomach) and/or vomiting.   Severe abdominal pain and distention (swelling).   Trouble swallowing.   Temperature over 101 F (37.8 C).   Rectal bleeding or vomiting of blood.    Gastroesophageal Reflux Disease, Adult Normally, food travels down the esophagus and stays in the stomach to be digested. However, when a person has gastroesophageal reflux disease (GERD), food and stomach acid move back up into the esophagus. When this happens, the esophagus becomes sore and inflamed. Over time, GERD can create small holes (ulcers) in the lining of the esophagus.  CAUSES This condition is caused by a problem with the muscle between the esophagus and the stomach (lower esophageal sphincter, or LES). Normally, the LES muscle closes after food passes through the esophagus to the stomach. When the LES is weakened or abnormal, it does not close properly, and that allows food and stomach acid to go back up into the esophagus. The LES can be weakened by certain dietary substances, medicines, and medical conditions, including:  Tobacco use.  Pregnancy.  Having a hiatal hernia.  Heavy alcohol use.  Certain foods and beverages, such as coffee, chocolate, onions, and peppermint. RISK FACTORS This condition is more likely to develop in:  People who have an increased body weight.  People who have connective tissue disorders.  People who use NSAID medicines. SYMPTOMS Symptoms of this condition include:  Heartburn.  Difficult or painful swallowing.  The feeling of having  a lump in the throat.  Abitter taste in the mouth.  Bad breath.  Having a large amount of saliva.  Having an upset or bloated stomach.  Belching.  Chest pain.  Shortness of breath or wheezing.  Ongoing (chronic) cough or a night-time cough.  Wearing away of tooth enamel.  Weight loss. Different conditions can cause chest pain. Make sure to see your health care provider if you experience chest pain. DIAGNOSIS Your health care provider will take a medical history and perform a physical exam. To determine if you have mild or severe GERD, your health care provider may also monitor how you respond to treatment. You may also have other tests, including:  An endoscopy toexamine your stomach and esophagus with a small camera.  A test thatmeasures the acidity level in your esophagus.  A test thatmeasures how much pressure is on your esophagus.  A barium swallow or modified barium swallow to show the shape, size, and functioning of your esophagus. TREATMENT The goal of treatment is to help relieve your symptoms and to  prevent complications. Treatment for this condition may vary depending on how severe your symptoms are. Your health care provider may recommend:  Changes to your diet.  Medicine.  Surgery. HOME CARE INSTRUCTIONS Diet  Follow a diet as recommended by your health care provider. This may involve avoiding foods and drinks such as:  Coffee and tea (with or without caffeine).  Drinks that containalcohol.  Energy drinks and sports drinks.  Carbonated drinks or sodas.  Chocolate and cocoa.  Peppermint and mint flavorings.  Garlic and onions.  Horseradish.  Spicy and acidic foods, including peppers, chili powder, curry powder, vinegar, hot sauces, and barbecue sauce.  Citrus fruit juices and citrus fruits, such as oranges, lemons, and limes.  Tomato-based foods, such as red sauce, chili, salsa, and pizza with red sauce.  Fried and fatty foods, such  as donuts, french fries, potato chips, and high-fat dressings.  High-fat meats, such as hot dogs and fatty cuts of red and white meats, such as rib eye steak, sausage, ham, and bacon.  High-fat dairy items, such as whole milk, butter, and cream cheese.  Eat small, frequent meals instead of large meals.  Avoid drinking large amounts of liquid with your meals.  Avoid eating meals during the 2-3 hours before bedtime.  Avoid lying down right after you eat.  Do not exercise right after you eat. General Instructions  Pay attention to any changes in your symptoms.  Take over-the-counter and prescription medicines only as told by your health care provider. Do not take aspirin, ibuprofen, or other NSAIDs unless your health care provider told you to do so.  Do not use any tobacco products, including cigarettes, chewing tobacco, and e-cigarettes. If you need help quitting, ask your health care provider.  Wear loose-fitting clothing. Do not wear anything tight around your waist that causes pressure on your abdomen.  Raise (elevate) the head of your bed 6 inches (15cm).  Try to reduce your stress, such as with yoga or meditation. If you need help reducing stress, ask your health care provider.  If you are overweight, reduce your weight to an amount that is healthy for you. Ask your health care provider for guidance about a safe weight loss goal.  Keep all follow-up visits as told by your health care provider. This is important. SEEK MEDICAL CARE IF:  You have new symptoms.  You have unexplained weight loss.  You have difficulty swallowing, or it hurts to swallow.  You have wheezing or a persistent cough.  Your symptoms do not improve with treatment.  You have a hoarse voice. SEEK IMMEDIATE MEDICAL CARE IF:  You have pain in your arms, neck, jaw, teeth, or back.  You feel sweaty, dizzy, or light-headed.  You have chest pain or shortness of breath.  You vomit and your vomit  looks like blood or coffee grounds.  You faint.  Your stool is bloody or black.  You cannot swallow, drink, or eat.   This information is not intended to replace advice given to you by your health care provider. Make sure you discuss any questions you have with your health care provider.   Document Released: 08/08/2005 Document Revised: 07/20/2015 Document Reviewed: 02/23/2015 Elsevier Interactive Patient Education 2016 Elsevier [employer]     Colon Polyps Polyps are lumps of extra tissue growing inside the body. Polyps can grow in the large intestine (colon). Most colon polyps are noncancerous (benign). However, some colon polyps can become cancerous over time. Polyps that are larger than a pea  may be harmful. To be safe, caregivers remove and test all polyps. CAUSES  Polyps form when mutations in the genes cause your cells to grow and divide even though no more tissue is needed. RISK FACTORS There are a number of risk factors that can increase your chances of getting colon polyps. They include:  Being older than 50 years.  Family history of colon polyps or colon cancer.  Long-term colon diseases, such as colitis or Crohn disease.  Being overweight.  Smoking.  Being inactive.  Drinking too much alcohol. SYMPTOMS  Most small polyps do not cause symptoms. If symptoms are present, they may include:  Blood in the stool. The stool may look dark red or black.  Constipation or diarrhea that lasts longer than 1 week. DIAGNOSIS People often do not know they have polyps until their caregiver finds them during a regular checkup. Your caregiver can use 4 tests to check for polyps:  Digital rectal exam. The caregiver wears gloves and feels inside the rectum. This test would find polyps only in the rectum.  Barium enema. The caregiver puts a liquid called barium into your rectum before taking X-rays of your colon. Barium makes your colon look white. Polyps are dark, so they are easy to  see in the X-ray pictures.  Sigmoidoscopy. A thin, flexible tube (sigmoidoscope) is placed into your rectum. The sigmoidoscope has a light and tiny camera in it. The caregiver uses the sigmoidoscope to look at the last third of your colon.  Colonoscopy. This test is like sigmoidoscopy, but the caregiver looks at the entire colon. This is the most common method for finding and removing polyps. TREATMENT  Any polyps will be removed during a sigmoidoscopy or colonoscopy. The polyps are then tested for cancer. PREVENTION  To help lower your risk of getting more colon polyps:  Eat plenty of fruits and vegetables. Avoid eating fatty foods.  Do not smoke.  Avoid drinking alcohol.  Exercise every day.  Lose weight if recommended by your caregiver.  Eat plenty of calcium and folate. Foods that are rich in calcium include milk, cheese, and broccoli. Foods that are rich in folate include chickpeas, kidney beans, and spinach. HOME CARE INSTRUCTIONS Keep all follow-up appointments as directed by your caregiver. You may need periodic exams to check for polyps. SEEK MEDICAL CARE IF: You notice bleeding during a bowel movement.   This information is not intended to replace advice given to you by your health care provider. Make sure you discuss any questions you have with your health care provider.   Document Released: 07/25/2004 Document Revised: 11/19/2014 Document Reviewed: 01/08/2012 Elsevier Interactive Patient Education [employer].

## 2016-04-30 NOTE — Transfer of Care (Signed)
Immediate Anesthesia Transfer of Care Note  Patient: Kara Bailey  Procedure(s) Performed: Procedure(s) with comments: COLONOSCOPY WITH PROPOFOL (N/A) - 1230 ESOPHAGOGASTRODUODENOSCOPY (EGD) WITH PROPOFOL (N/A) MALONEY DILATION (N/A) POLYPECTOMY - Sigmoid colon polyp removed via hot snare  Patient Location: PACU  Anesthesia Type:MAC  Level of Consciousness: awake and patient cooperative  Airway & Oxygen Therapy: Patient Spontanous Breathing and Patient connected to nasal cannula oxygen  Post-op Assessment: Report given to RN, Post -op Vital signs reviewed and stable and Patient moving all extremities  Post vital signs: Reviewed and stable    Last Pain:  Filed Vitals:   04/30/16 1206  PainSc: 7       Patients Stated Pain Goal: 5 [phone]  Complications: No apparent anesthesia complications

## 2016-04-30 NOTE — Op Note (Addendum)
[facility] Patient Name: Kara Bailey Procedure Date: 04/30/2016 12:25 PM MRN: [id number] Date of Birth: 11-25-1955 Attending MD: Norvel Richards , MD CSN: [id number] Age: 60 Admit Type: Outpatient Procedure:                Colonoscopy with snare polypectomy Indications:              High risk colon cancer surveillance: Personal                            history of colonic polyps Providers:                Norvel Richards, MD, Renda Rolls, RN, Isabella Stalling, Technician Referring MD:              Medicines:                Propofol per Anesthesia Complications:            No immediate complications. Estimated Blood Loss:     Estimated blood loss: none. Procedure:                Pre-Anesthesia Assessment:                           - Prior to the procedure, a History and Physical                            was performed, and patient medications and                            allergies were reviewed. The patient's tolerance of                            previous anesthesia was also reviewed. The risks                            and benefits of the procedure and the sedation                            options and risks were discussed with the patient.                            All questions were answered, and informed consent                            was obtained. Prior Anticoagulants: The patient has                            taken no previous anticoagulant or antiplatelet                            agents. ASA Grade Assessment: II - A patient with  mild systemic disease. After reviewing the risks                            and benefits, the patient was deemed in                            satisfactory condition to undergo the procedure.                           - Prior to the procedure, a History and Physical                            was performed, and patient medications and                            allergies  were reviewed. The patient's tolerance of                            previous anesthesia was also reviewed. The risks                            and benefits of the procedure and the sedation                            options and risks were discussed with the patient.                            All questions were answered, and informed consent                            was obtained. Prior Anticoagulants: The patient has                            taken no previous anticoagulant or antiplatelet                            agents. ASA Grade Assessment: II - A patient with                            mild systemic disease. After reviewing the risks                            and benefits, the patient was deemed in                            satisfactory condition to undergo the procedure.                           After obtaining informed consent, the colonoscope                            was passed under direct vision. Throughout the  procedure, the patient's blood pressure, pulse, and                            oxygen saturations were monitored continuously. The                            [id number]) scope was introduced through                            the anus and advanced to the the cecum, identified                            by appendiceal orifice and ileocecal valve. The                            entire colon was well visualized. The ileocecal                            valve, appendiceal orifice, and rectum were                            photographed. The colonoscopy was performed without                            difficulty. The patient tolerated the procedure                            well. The quality of the bowel preparation was                            adequate. Scope In: 12:29:05 PM Scope Out: 12:41:54 PM Scope Withdrawal Time: 0 hours 6 minutes 22 seconds  Total Procedure Duration: 0 hours 12 minutes 49 seconds  Findings:      The  perianal and digital rectal examinations were normal.      A 9 mm polyp was found in the sigmoid colon. The polyp was       semi-pedunculated. The polyp was removed with a hot snare. Resection and       retrieval were complete. Estimated blood loss: none. Patient had tiny       mamillation's in the rectosigmoid segment; otherwise, the remainder of       the colonic mucosa appeared normal. Impression:               - One 9 mm polyp in the sigmoid colon, removed with                            a hot snare. Resected and retrieved. Moderate Sedation:      Moderate (conscious) sedation was personally administered by an       anesthesia professional. The following parameters were monitored: oxygen       saturation, heart rate, blood pressure, respiratory rate, EKG, adequacy       of pulmonary ventilation, and response to care. Total physician       intraservice time was 33 minutes. Recommendation:           -  Patient has a contact number available for                            emergencies. The signs and symptoms of potential                            delayed complications were discussed with the                            patient. Return to normal activities tomorrow.                            Written discharge instructions were provided to the                            patient.                           - Advance diet as tolerated.                           - Continue present medications.                           - Repeat colonoscopy date to be determined after                            pending pathology results are reviewed for                            surveillance based on pathology results.                           - Return to GI office in 12 weeks. See EGD report. Procedure Code(s):        --- Professional ---                           [phone], Colonoscopy, flexible; with removal of                            tumor(s), polyp(s), or other lesion(s) by snare                             technique Diagnosis Code(s):        --- Professional ---                           Z86.010, Personal history of colonic polyps                           D12.5, Benign neoplasm of sigmoid colon CPT copyright 2016 American Medical Association. All rights reserved. The codes documented in this report are preliminary and upon coder review may  be revised to meet current compliance requirements. Cristopher Estimable. Ala Capri, MD Norvel Richards, MD 04/30/2016 12:54:15 PM This report has been signed electronically. Number of Addenda: 0

## 2016-04-30 NOTE — Anesthesia Procedure Notes (Signed)
Procedure Name: MAC Date/Time: 04/30/2016 12:01 PM Performed by: Vista Deck Pre-anesthesia Checklist: Patient identified, Emergency Drugs available, Suction available, Timeout performed and Patient being monitored Patient Re-evaluated:Patient Re-evaluated prior to inductionOxygen Delivery Method: Non-rebreather mask

## 2016-04-30 NOTE — Interval H&P Note (Signed)
History and Physical Interval Note:  04/30/2016 11:58 AM  Kara Bailey  has presented today for surgery, with the diagnosis of dysphagia/odynophagia/Hep C  The various methods of treatment have been discussed with the patient and family. After consideration of risks, benefits and other options for treatment, the patient has consented to  Procedure(s) with comments: COLONOSCOPY WITH PROPOFOL (N/A) - 1230 ESOPHAGOGASTRODUODENOSCOPY (EGD) WITH PROPOFOL (N/A) MALONEY DILATION (N/A) as a surgical intervention .  The patient's history has been reviewed, patient examined, no change in status, stable for surgery.  I have reviewed the patient's chart and labs.  Questions were answered to the patient's satisfaction.     Ravindra Baranek  No change. EGD/EGD and colonoscopy per plan. Patient ate solid food yesterday. May or may not be able to complete colonoscopy but will attempt.. The risks, benefits, limitations, imponderables and alternatives regarding both EGD and colonoscopy have been reviewed with the patient. Questions have been answered. All parties agreeable.

## 2016-04-30 NOTE — Anesthesia Postprocedure Evaluation (Signed)
Anesthesia Post Note  Patient: Kara Bailey  Procedure(s) Performed: Procedure(s) (LRB): COLONOSCOPY WITH PROPOFOL (N/A) ESOPHAGOGASTRODUODENOSCOPY (EGD) WITH PROPOFOL (N/A) MALONEY DILATION (N/A) POLYPECTOMY  Patient location during evaluation: PACU Anesthesia Type: MAC Level of consciousness: awake and alert Pain management: pain level controlled Vital Signs Assessment: post-procedure vital signs reviewed and stable Respiratory status: spontaneous breathing Cardiovascular status: stable Anesthetic complications: no    Last Vitals:  Filed Vitals:   04/30/16 1155 04/30/16 1200  BP: 120/80 117/79  Pulse:    Temp:    Resp: 16 16    Last Pain:  Filed Vitals:   04/30/16 1206  PainSc: 7                  Ohm Dentler

## 2016-04-30 NOTE — H&P (View-Only) (Signed)
Primary Care Physician:  Minerva Ends, MD Primary Gastroenterologist:  Dr. Gala Romney  Chief Complaint  Patient presents with  . Dysphagia    HPI:   Kara Bailey is a 60 y.o. female who presents on referral from primary care for dysphagia and odynophagia. PCP notes reviewed, this is a persistent problem for 15 years. Had upper endoscopy and esophageal dilation about 6-8 years ago likely at [facility] gastroenterology. Symptoms improve for 1 or 2 years afterward than had some intermittent return of symptoms with a UTI swallow evaluation in 2014 that was normal except for hiatal hernia. Worsening dysphagia and odynophagia symptoms over the past year, particularly with meats. PCP also notes history of alcohol abuse currently drinks a sixpack of beer a week, has persistent stomach pain for which she takes naproxen 1-2 times a day. She is currently on a PPI.  Today she states her dysphagia has gotten worse over the past year. Worse with pills and meats. Pills will come back up, especially big pills. Denies abdominal pain, vomiting. Will have nausea and odynophagia with her dysphagia. Symptoms occur with all meat consumption. Has been trying to eat softer foods. Denies hematochezia, melena, fever, chills. Has lost some minimal weight (2-3 lbs) due to not able to eat normally with her symptoms. Admits GERD symptoms, currently on Protonix but unable to take it every day due to pill dysphagia as noted above. Denies chest pain, dyspnea, dizziness, lightheadedness, syncope, near syncope. Denies any other upper or lower GI symptoms.  Is currnetly being treated for HCV, last RNA quant was undetectable, a couple weeks remain on treatment.  Past Medical History  Diagnosis Date  . Hepatitis C   . Shingles     left side  . Hepatitis C virus infection   . Palpitations   . PAC (premature atrial contraction) 10/11/2014  . PVC (premature ventricular contraction) 10/11/2014  . Dyspnea 08/2014  . Chest pain  08/2014    unspecified  . Tachycardia 08/2014  . Dizziness 08/2014  . Weakness 08/2014    Past Surgical History  Procedure Laterality Date  . Appendectomy    . Left heart catheterization with coronary angiogram N/A 10/29/2014    Procedure: LEFT HEART CATHETERIZATION WITH CORONARY ANGIOGRAM;  Surgeon: Burnell Blanks, MD;  Location: [facility] CATH LAB;  Service: Cardiovascular;  Laterality: N/A;  . Esophagogastroduodenoscopy      approximately 2010    Current Outpatient Prescriptions  Medication Sig Dispense Refill  . ALPRAZolam (XANAX) 0.5 MG tablet Take 1 tablet (0.5 mg total) by mouth at bedtime as needed for anxiety. 30 tablet 2  . FLUoxetine (PROZAC) 20 MG tablet Take 1 tablet (20 mg total) by mouth daily. 30 tablet 3  . metoprolol succinate (TOPROL-XL) 100 MG 24 hr tablet Take 1 tablet (100 mg total) by mouth daily. Take with or immediately following a meal. 30 tablet 5  . naproxen (NAPROSYN) 500 MG tablet Take 1 tablet (500 mg total) by mouth 2 (two) times daily with a meal. 30 tablet 0  . oxyCODONE-acetaminophen (PERCOCET/ROXICET) 5-325 MG tablet Take 1-2 tablets by mouth every 6 (six) hours as needed for severe pain. 20 tablet 0  . pantoprazole (PROTONIX) 40 MG tablet Take 1 tablet (40 mg total) by mouth daily. 30 tablet 5   No current facility-administered medications for this visit.    Allergies as of 04/12/2016  . (No Known Allergies)    Family History  Problem Relation Age of Onset  . Cancer Neg Hx   .  Heart failure Mother   . Heart attack Mother   . Heart disease Mother   . Hypertension Mother   . Hyperlipidemia Mother   . CVA Father   . Alzheimer's disease Father   . Colon cancer Neg Hx     Social History   Social History  . Marital Status: Divorced    Spouse Name: N/A  . Number of Children: N/A  . Years of Education: N/A   Occupational History  . Not on file.   Social History Main Topics  . Smoking status: Current Every Day Smoker -- 0.25  packs/day    Types: Cigarettes    Start date: 11/12/1968  . Smokeless tobacco: Never Used     Comment: cutting back, wearing patches  . Alcohol Use: 0.0 oz/week    0 Standard drinks or equivalent per week     Comment: beer on weekends (6-pack)  . Drug Use: No  . Sexual Activity: Not on file   Other Topics Concern  . Not on file   Social History Narrative    Review of Systems: General: Negative for anorexia, weight loss, fever, chills, fatigue, weakness. ENT: Negative for hoarseness. Admits dysphagia and odynophagia. CV: Negative for chest pain, angina, palpitations, peripheral edema.  Respiratory: Negative for dyspnea at rest, cough, sputum, wheezing.  GI: See history of present illness. Derm: Negative for rash or itching.  Neuro: Negative for memory loss, confusion.  Endo: Negative for unusual weight change.  Heme: Negative for bruising or bleeding. Allergy: Negative for rash or hives.    Physical Exam: BP 179/95 mmHg  Pulse 76  Temp(Src) 97.3 F (36.3 C) (Oral)  Ht 5\' 6"  (1.676 m)  Wt 112 lb (50.803 kg)  BMI 18.09 kg/m2 General:   Alert and oriented. Pleasant and cooperative. Well-nourished and well-developed.  Head:  Normocephalic and atraumatic. Eyes:  Without icterus, sclera clear and conjunctiva pink.  Ears:  Normal auditory acuity. Cardiovascular:  S1, S2 present without murmurs appreciated. Extremities without clubbing or edema. Respiratory:  Clear to auscultation bilaterally. No wheezes, rales, or rhonchi. No distress.  Gastrointestinal:  +BS, soft, non-tender and non-distended. No HSM noted. No guarding or rebound. No masses appreciated.  Rectal:  Deferred  Musculoskalatal:  Symmetrical without gross deformities. Neurologic:  Alert and oriented x4;  grossly normal neurologically. Psych:  Alert and cooperative. Normal mood and affect. Heme/Lymph/Immune: No excessive bruising noted.    04/12/2016 10:00 AM   Disclaimer: This note was dictated with voice  recognition software. Similar sounding words can inadvertently be transcribed and may not be corrected upon review.

## 2016-04-30 NOTE — Progress Notes (Signed)
From ENDO. Awake. Talking. Denies pain. Wants to go to Bathroom. Refuses bedpan.

## 2016-04-30 NOTE — Op Note (Addendum)
[facility] Patient Name: Kara Bailey Procedure Date: 04/30/2016 11:51 AM MRN: [id number] Date of Birth: 1956/09/06 Attending MD: Norvel Richards , MD CSN: [id number] Age: 60 Admit Type: Outpatient Procedure:                Upper GI endoscopy with Venia Minks dilation Indications:              Dysphagia Providers:                Norvel Richards, MD, Renda Rolls, RN, Isabella Stalling, Technician Referring MD:              Medicines:                Propofol per Anesthesia Complications:            No immediate complications. Estimated Blood Loss:     Estimated blood loss was minimal. Procedure:                Pre-Anesthesia Assessment:                           - Prior to the procedure, a History and Physical                            was performed, and patient medications and                            allergies were reviewed. The patient's tolerance of                            previous anesthesia was also reviewed. The risks                            and benefits of the procedure and the sedation                            options and risks were discussed with the patient.                            All questions were answered, and informed consent                            was obtained. Prior Anticoagulants: The patient has                            taken no previous anticoagulant or antiplatelet                            agents. ASA Grade Assessment: II - A patient with                            mild systemic disease. After reviewing the risks  and benefits, the patient was deemed in                            satisfactory condition to undergo the procedure.                           After obtaining informed consent, the endoscope was                            passed under direct vision. Throughout the                            procedure, the patient's blood pressure, pulse, and   oxygen saturations were monitored continuously. The                            [id number] scope was introduced through the                            mouth, and advanced to the second part of duodenum.                            The upper GI endoscopy was accomplished without                            difficulty. The patient tolerated the procedure                            well. Scope In: 12:14:07 PM Scope Out: 12:22:39 PM Total Procedure Duration: 0 hours 8 minutes 32 seconds  Findings:      LA Grade A (one or more mucosal breaks less than 5 mm, not extending       between tops of 2 mucosal folds) esophagitis was found 32 to 33 cm from       the incisors.      One mild benign-appearing, intrinsic stenosis was found 32 to 33 cm from       the incisors. This measured 2 mm (inner diameter). No Barrett's       epithelium seen.      The exam was otherwise without abnormality.      A medium-sized hiatal hernia was present. luminal narrowing. Estimated       blood loss was minimal.      The second portion of the duodenum was normal. The scope was withdrawn.       Dilation was performed with a Maloney dilator with no resistance at 40       Fr. The dilation site was examined following endoscope reinsertion and       showed moderate improvement in Impression:               - LA Grade A esophagitis.                           - Benign-appearing esophageal stenosis. Dilated.                           - The examination was otherwise normal.                           -  Medium-sized hiatal hernia.                           - The examination was otherwise normal.                           - Normal second portion of the duodenum.                           - No specimens collected. Moderate Sedation:      Moderate (conscious) sedation was personally administered by an       anesthesia professional. The following parameters were monitored: oxygen       saturation, heart rate, blood pressure,  respiratory rate, EKG, adequacy       of pulmonary ventilation, and response to care. Total physician       intraservice time was 15 minutes. Recommendation:           - Patient has a contact number available for                            emergencies. The signs and symptoms of potential                            delayed complications were discussed with the                            patient. Return to normal activities tomorrow.                            Written discharge instructions were provided to the                            patient.                           - Patient has a contact number available for                            emergencies. The signs and symptoms of potential                            delayed complications were discussed with the                            patient. Return to normal activities tomorrow.                            Written discharge instructions were provided to the                            patient.                           - Patient has a contact number available for  emergencies. The signs and symptoms of potential                            delayed complications were discussed with the                            patient. Return to normal activities tomorrow.                            Written discharge instructions were provided to the                            patient.                           - Advance diet as tolerated.                           - Continue present medications but increase                            Protonix to 40 mg twice daily.                           - Await pathology results.                           - No repeat upper endoscopy.                           - Return to GI office in 12 weeks. See colonoscopy                            report. Procedure Code(s):        --- Professional ---                           [phone], Esophagogastroduodenoscopy, flexible,                             transoral; diagnostic, including collection of                            specimen(s) by brushing or washing, when performed                            (separate procedure)                           43450, Dilation of esophagus, by unguided sound or                            bougie, single or multiple passes Diagnosis Code(s):        --- Professional ---                           K20.9, Esophagitis, unspecified  K22.2, Esophageal obstruction                           K44.9, Diaphragmatic hernia without obstruction or                            gangrene                           R13.10, Dysphagia, unspecified CPT copyright 2016 American Medical Association. All rights reserved. The codes documented in this report are preliminary and upon coder review may  be revised to meet current compliance requirements. Cristopher Estimable. Yan Pankratz, MD Norvel Richards, MD 04/30/2016 12:48:19 PM This report has been signed electronically. Number of Addenda: 0

## 2016-04-30 NOTE — Anesthesia Preprocedure Evaluation (Signed)
Anesthesia Evaluation  Patient identified by MRN, date of birth, ID band Patient awake    Reviewed: Allergy & Precautions, NPO status , Patient's Chart, lab work & pertinent test results  Airway Mallampati: I  TM Distance: >3 FB     Dental  (+) Poor Dentition, Edentulous Upper, Loose, Missing, Dental Advisory Given,    Pulmonary shortness of breath and with exertion, Current Smoker,    breath sounds clear to auscultation       Cardiovascular hypertension, Pt. on medications  Rhythm:Regular Rate:Normal     Neuro/Psych PSYCHIATRIC DISORDERS Anxiety    GI/Hepatic hiatal hernia, GERD  ,(+)     substance abuse  alcohol use, Hepatitis -, C  Endo/Other    Renal/GU      Musculoskeletal   Abdominal   Peds  Hematology   Anesthesia Other Findings   Reproductive/Obstetrics                             Anesthesia Physical Anesthesia Plan  ASA: III  Anesthesia Plan: MAC   Post-op Pain Management:    Induction: Intravenous  Airway Management Planned: Simple Face Mask  Additional Equipment:   Intra-op Plan:   Post-operative Plan:   Informed Consent: I have reviewed the patients History and Physical, chart, labs and discussed the procedure including the risks, benefits and alternatives for the proposed anesthesia with the patient or authorized representative who has indicated his/her understanding and acceptance.     Plan Discussed with:   Anesthesia Plan Comments:         Anesthesia Quick Evaluation

## 2016-05-02 ENCOUNTER — Encounter (HOSPITAL_COMMUNITY): Payer: Self-pay | Admitting: Internal Medicine

## 2016-05-02 ENCOUNTER — Ambulatory Visit: Payer: Self-pay | Admitting: Physical Therapy

## 2016-05-02 ENCOUNTER — Encounter: Payer: Self-pay | Admitting: Internal Medicine

## 2016-05-03 ENCOUNTER — Other Ambulatory Visit: Payer: Self-pay | Admitting: Nurse Practitioner

## 2016-05-03 DIAGNOSIS — R7989 Other specified abnormal findings of blood chemistry: Secondary | ICD-10-CM

## 2016-05-08 ENCOUNTER — Ambulatory Visit: Payer: Self-pay | Attending: Orthopaedic Surgery | Admitting: Physical Therapy

## 2016-05-08 DIAGNOSIS — M25632 Stiffness of left wrist, not elsewhere classified: Secondary | ICD-10-CM

## 2016-05-08 DIAGNOSIS — M25532 Pain in left wrist: Secondary | ICD-10-CM

## 2016-05-08 DIAGNOSIS — M6281 Muscle weakness (generalized): Secondary | ICD-10-CM
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
## 2016-05-09 ENCOUNTER — Other Ambulatory Visit: Payer: Self-pay

## 2016-05-09 DIAGNOSIS — R7989 Other specified abnormal findings of blood chemistry: Secondary | ICD-10-CM

## 2016-05-11 ENCOUNTER — Ambulatory Visit: Payer: Self-pay | Admitting: Physical Therapy

## 2016-05-17 ENCOUNTER — Ambulatory Visit: Payer: Self-pay | Admitting: Physical Therapy

## 2016-05-22 ENCOUNTER — Encounter: Payer: Self-pay | Admitting: Physical Therapy

## 2016-05-23 ENCOUNTER — Ambulatory Visit: Payer: Self-pay | Attending: Orthopaedic Surgery | Admitting: Physical Therapy

## 2016-05-23 DIAGNOSIS — M25532 Pain in left wrist: Secondary | ICD-10-CM | POA: Insufficient documentation

## 2016-05-23 DIAGNOSIS — M25632 Stiffness of left wrist, not elsewhere classified: Secondary | ICD-10-CM | POA: Insufficient documentation

## 2016-05-23 DIAGNOSIS — M6281 Muscle weakness (generalized): Secondary | ICD-10-CM | POA: Insufficient documentation

## 2016-05-24 ENCOUNTER — Ambulatory Visit: Payer: Self-pay | Admitting: Physical Therapy

## 2016-05-24 DIAGNOSIS — M25532 Pain in left wrist: Secondary | ICD-10-CM

## 2016-05-24 DIAGNOSIS — M6281 Muscle weakness (generalized): Secondary | ICD-10-CM

## 2016-05-24 DIAGNOSIS — M25632 Stiffness of left wrist, not elsewhere classified: Secondary | ICD-10-CM

## 2016-05-24 NOTE — Therapy (Signed)
[place], [place], [address] Phone: [phone]   Fax:  [phone]  Physical Therapy Treatment  Patient Details  Name: Kara Bailey MRN: [id number] Date of Birth: 1956/06/17 Referring Provider: Dr Frankey Shown   Encounter Date: 05/24/2016      PT End of Session - 05/24/16 0837    Visit Number 2   Number of Visits 16   Date for PT Re-Evaluation 06/19/16   Authorization Type CAFA    PT Start Time 0808   PT Stop Time 0846   PT Time Calculation (min) 38 min   Activity Tolerance Patient tolerated treatment well   Behavior During Therapy [facility] for tasks assessed/performed      Past Medical History  Diagnosis Date  . Hepatitis C   . Shingles     left side  . Hepatitis C virus infection   . Palpitations   . PAC (premature atrial contraction) 10/11/2014  . PVC (premature ventricular contraction) 10/11/2014  . Dyspnea 08/2014  . Chest pain 08/2014    unspecified  . Tachycardia 08/2014  . Dizziness 08/2014  . Weakness 08/2014  . Hypertension   . GERD (gastroesophageal reflux disease)   . Anxiety     Past Surgical History  Procedure Laterality Date  . Appendectomy    . Left heart catheterization with coronary angiogram N/A 10/29/2014    Procedure: LEFT HEART CATHETERIZATION WITH CORONARY ANGIOGRAM;  Surgeon: Burnell Blanks, MD;  Location: [facility] CATH LAB;  Service: Cardiovascular;  Laterality: N/A;  . Esophagogastroduodenoscopy      approximately 2010  . Colonoscopy with propofol N/A 04/30/2016    Procedure: COLONOSCOPY WITH PROPOFOL;  Surgeon: Daneil Dolin, MD;  [facility];  Service: Endoscopy;  Laterality: N/A;  1230  . Esophagogastroduodenoscopy (egd) with propofol N/A 04/30/2016    Procedure: ESOPHAGOGASTRODUODENOSCOPY (EGD) WITH PROPOFOL;  Surgeon: Daneil Dolin, MD;  [facility];  Service: Endoscopy;  Laterality: N/A;  Venia Minks dilation N/A 04/30/2016   Procedure: Venia Minks DILATION;  Surgeon: Daneil Dolin, MD;  [facility];  Service: Endoscopy;  Laterality: N/A;  . Polypectomy  04/30/2016    Procedure: POLYPECTOMY;  Surgeon: Daneil Dolin, MD;  [facility];  Service: Endoscopy;;  Sigmoid colon polyp removed via hot snare    There were no vitals filed for this visit.      Subjective Assessment - 05/24/16 0819    Subjective Patienbt reports her wrist and hand has been much better. She has been doing her exercises. She has stopped taking her pain medications. She continues to have some pain with functional tasks and she feels like her grip is litmied.    Pertinent History Pt with Chronic Hepatitis C , back pain for > 5 years   Limitations House hold activities;Sitting;Standing;Walking   How long can you sit comfortably? 15 min   How long can you stand comfortably? 10 min   How long can you walk comfortably? 10 min   Patient Stated Goals Get rid of pain.  Be able to work,  i have trouble vacuuming and sweeping   Currently in Pain? Yes   Pain Score 2    Pain Location Wrist   Pain Orientation Left   Pain Descriptors / Indicators Aching;Sharp   Pain Type Chronic pain   Pain Onset More than a month ago   Pain Frequency Intermittent   Aggravating Factors  movement of the brace  Pain Relieving Factors pain medications    Effect of Pain on Daily Activities difficulty using her left arm for ADL's                                  PT Education - 05/24/16 0834    Education provided Yes   Education Details continue with movement    Person(s) Educated Patient   Methods Explanation;Demonstration   Comprehension Verbalized understanding;Returned demonstration          PT Short Term Goals - 05/24/16 0926    PT SHORT TERM GOAL #1   Baseline performing initial HEP at home    Time 3   Period Weeks   Status Partially Met   PT SHORT TERM GOAL #2   Title Patient will increase left wrist  flexion and extension by 20 degrees without increased pain    Baseline Improved motion    Time 3   Period Weeks   Status On-going   PT SHORT TERM GOAL #3   Title Patient will increase left grip strength by 20 lbs    Time 3   Period Weeks   Status On-going   PT SHORT TERM GOAL #4   Title Pt will report 3/10 pain at worst    Baseline 3/10 today assess carryover    Time 3   Period Weeks   Status On-going   PT SHORT TERM GOAL #5   Title Patient will perfrom finger oppostion to every finger without pain    Time 3   Period Weeks   Status Achieved           PT Long Term Goals - 05/08/16 1144    PT LONG TERM GOAL #1   Title Patient will demsotrate 65 degrees of active left elobow supination without pain in order to hold objects   Time 6   Period Weeks   Status New   PT LONG TERM GOAL #2   Title Patient will demsotrate 35 pounds of left grip strength in order to carry itesms    Time 6   Period Weeks   Status New   PT LONG TERM GOAL #3   Title Patient will use her left arm for adls with > 8/25 pain in her wrist    Time 6   Period Weeks   Status New               Plan - 05/24/16 0539    Clinical Impression Statement Patient made good progress today.She was able to use weights for ther-ex today. Her range also improved. The focal swelling around her fracture is also down. The patient has canceled and no-showed for 4 visits. She was advised if she misses another one she will be discharged. She understands but states If something comes up she will cancel . Patient was 8 minutes late for her appointment   Rehab Potential Good   PT Frequency 2x / week   PT Duration 8 weeks   PT Treatment/Interventions ADLs/Self Care Home Management;Cryotherapy;Electrical Stimulation;Iontophoresis 69m/ml Dexamethasone;Moist Heat;Ultrasound;Therapeutic activities;Therapeutic exercise;Manual techniques;Patient/family education;Neuromuscular re-education;Passive range of motion;Dry  needling;Taping   PT Next Visit Plan continue with gentle wrist mobilization, encourage movement of hand and fingers, continue pain control modalities as needed   PT Home Exercise Plan wrist 4 way active ROM, towel squeeze, finger oposition,    Consulted and Agree with Plan of Care Patient      Patient will  benefit from skilled therapeutic intervention in order to improve the following deficits and impairments:     Visit Diagnosis: Pain in left wrist  Stiffness of left wrist, not elsewhere classified  Muscle weakness (generalized)     Problem List Patient Active Problem List   Diagnosis Date Noted  . Peptic stricture of esophagus   . History of colonic polyps   . Hiatal hernia 03/16/2016  . Odynophagia 03/16/2016  . Dysphagia 03/16/2016  . Alcohol abuse 10/26/2015  . HTN (hypertension) 09/26/2015  . Chest pain 10/14/2014  . PAC (premature atrial contraction) 10/11/2014  . PVC (premature ventricular contraction) 10/11/2014  . Back muscle spasm 06/15/2014  . Generalized anxiety disorder 06/15/2014  . Smoking 06/15/2014  . 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 Chronic hepatitis C without hepatic coma ([place]) 05/28/2013  . Jaundice 05/04/2013  . Transaminitis 05/04/2013    Carney Living  PT DPT  05/24/2016, 9:59 AM  [facility] [address] [place], [place], [address] Phone: [phone]   Fax:  [phone]  Name: Kara Bailey MRN: [id number] Date of Birth: [birth year]

## 2016-05-29 ENCOUNTER — Ambulatory Visit: Payer: Self-pay | Admitting: Physical Therapy

## 2016-05-29 DIAGNOSIS — M25532 Pain in left wrist: Secondary | ICD-10-CM

## 2016-05-29 DIAGNOSIS — M25632 Stiffness of left wrist, not elsewhere classified: Secondary | ICD-10-CM

## 2016-05-29 DIAGNOSIS — M6281 Muscle weakness (generalized): Secondary | ICD-10-CM

## 2016-05-29 NOTE — Therapy (Signed)
[place], [place], [address] Phone: [phone]   Fax:  [phone]  Physical Therapy Treatment  Patient Details  Name: Kara Bailey MRN: [id number] Date of Birth: 01/24/1956 Referring Provider: Dr Frankey Shown   Encounter Date: 05/29/2016      PT End of Session - 05/29/16 0917    Visit Number 3   Number of Visits 16   Date for PT Re-Evaluation 06/19/16   Authorization Type CAFA    PT Start Time 0848   PT Stop Time 0929   PT Time Calculation (min) 41 min   Activity Tolerance Patient tolerated treatment well   Behavior During Therapy [facility] for tasks assessed/performed      Past Medical History  Diagnosis Date  . Hepatitis C   . Shingles     left side  . Hepatitis C virus infection   . Palpitations   . PAC (premature atrial contraction) 10/11/2014  . PVC (premature ventricular contraction) 10/11/2014  . Dyspnea 08/2014  . Chest pain 08/2014    unspecified  . Tachycardia 08/2014  . Dizziness 08/2014  . Weakness 08/2014  . Hypertension   . GERD (gastroesophageal reflux disease)   . Anxiety     Past Surgical History  Procedure Laterality Date  . Appendectomy    . Left heart catheterization with coronary angiogram N/A 10/29/2014    Procedure: LEFT HEART CATHETERIZATION WITH CORONARY ANGIOGRAM;  Surgeon: Burnell Blanks, MD;  Location: [facility] CATH LAB;  Service: Cardiovascular;  Laterality: N/A;  . Esophagogastroduodenoscopy      approximately 2010  . Colonoscopy with propofol N/A 04/30/2016    Procedure: COLONOSCOPY WITH PROPOFOL;  Surgeon: Daneil Dolin, MD;  [facility];  Service: Endoscopy;  Laterality: N/A;  1230  . Esophagogastroduodenoscopy (egd) with propofol N/A 04/30/2016    Procedure: ESOPHAGOGASTRODUODENOSCOPY (EGD) WITH PROPOFOL;  Surgeon: Daneil Dolin, MD;  [facility];  Service: Endoscopy;  Laterality: N/A;  Venia Minks dilation N/A 04/30/2016     Procedure: Venia Minks DILATION;  Surgeon: Daneil Dolin, MD;  [facility];  Service: Endoscopy;  Laterality: N/A;  . Polypectomy  04/30/2016    Procedure: POLYPECTOMY;  Surgeon: Daneil Dolin, MD;  [facility];  Service: Endoscopy;;  Sigmoid colon polyp removed via hot snare    There were no vitals filed for this visit.      Subjective Assessment - 05/29/16 0910    Subjective Patient continues to make good progress. She is using her wrist more. She has been holding her dog leash in her left hand. Her pain reaches a 5/10 pat times but overall it is not very painful.    Pertinent History Pt with Chronic Hepatitis C , back pain for > 5 years   Limitations House hold activities;Sitting;Standing;Walking   How long can you sit comfortably? 15 min   How long can you stand comfortably? 10 min   How long can you walk comfortably? 10 min   Patient Stated Goals Get rid of pain.  Be able to work,  i have trouble vacuuming and sweeping   Currently in Pain? Yes   Pain Score 5    Pain Location Wrist   Pain Orientation Left   Pain Descriptors / Indicators Sharp   Pain Type Chronic pain   Pain Onset More than a month ago   Pain Frequency Intermittent   Aggravating Factors  movement and the brace    Pain  Relieving Factors pain medications   Effect of Pain on Daily Activities difficulty using her arm for ADL's   Multiple Pain Sites No                         [facility] Adult PT Treatment/Exercise - 05/29/16 0001    Exercises   Exercises Wrist   Wrist Exercises   Forearm Supination Limitations 1lb1x10    Forearm Pronation Limitations 1lb 2x10    Wrist Flexion Limitations 1lb 2x10    Wrist Extension Limitations 1lb 2x10    Other wrist exercises red finger extension 2x10; towel sueeze 2x10; finger oppoisition 2x10 Biceps curl 2x10; ;    Other wrist exercises Standing scapular retration 2x10 yellow, shoulder extension 2x10 yellow.    Manual Therapy   Manual  therapy comments Wrist and elbo PROM too improve ROM.                 PT Education - 05/29/16 0916    Education Details updated HEP Continue with strengthening at home    Person(s) Educated Patient   Methods Explanation;Demonstration   Comprehension Verbalized understanding;Returned demonstration          PT Short Term Goals - 05/24/16 0926    PT SHORT TERM GOAL #1   Baseline performing initial HEP at home    Time 3   Period Weeks   Status Partially Met   PT SHORT TERM GOAL #2   Title Patient will increase left wrist flexion and extension by 20 degrees without increased pain    Baseline Improved motion    Time 3   Period Weeks   Status On-going   PT SHORT TERM GOAL #3   Title Patient will increase left grip strength by 20 lbs    Time 3   Period Weeks   Status On-going   PT SHORT TERM GOAL #4   Title Pt will report 3/10 pain at worst    Baseline 3/10 today assess carryover    Time 3   Period Weeks   Status On-going   PT SHORT TERM GOAL #5   Title Patient will perfrom finger oppostion to every finger without pain    Time 3   Period Weeks   Status Achieved           PT Long Term Goals - 05/08/16 1144    PT LONG TERM GOAL #1   Title Patient will demsotrate 65 degrees of active left elobow supination without pain in order to hold objects   Time 6   Period Weeks   Status New   PT LONG TERM GOAL #2   Title Patient will demsotrate 35 pounds of left grip strength in order to carry itesms    Time 6   Period Weeks   Status New   PT LONG TERM GOAL #3   Title Patient will use her left arm for adls with > 0/71 pain in her wrist    Time 6   Period Weeks   Status New               Plan - 05/29/16 0919    Clinical Impression Statement Patient given pulling exercises to continue working on functional strengthening. She ois progressing towards her range, strength, and pain goals. Her grip was measured at 10 lbs on the left today compared to 35 on the  right.    Rehab Potential Good   PT Frequency 2x / week  PT Duration 8 weeks   PT Treatment/Interventions ADLs/Self Care Home Management;Cryotherapy;Electrical Stimulation;Iontophoresis 50m/ml Dexamethasone;Moist Heat;Ultrasound;Therapeutic activities;Therapeutic exercise;Manual techniques;Patient/family education;Neuromuscular re-education;Passive range of motion;Dry needling;Taping      Patient will benefit from skilled therapeutic intervention in order to improve the following deficits and impairments:  Pain, Decreased strength, Decreased mobility, Impaired sensation, Decreased activity tolerance, Impaired UE functional use, Decreased range of motion  Visit Diagnosis: Pain in left wrist  Muscle weakness (generalized)  Stiffness of left wrist, not elsewhere classified     Problem List Patient Active Problem List   Diagnosis Date Noted  . Peptic stricture of esophagus   . History of colonic polyps   . Hiatal hernia 03/16/2016  . Odynophagia 03/16/2016  . Dysphagia 03/16/2016  . Alcohol abuse 10/26/2015  . HTN (hypertension) 09/26/2015  . Chest pain 10/14/2014  . PAC (premature atrial contraction) 10/11/2014  . PVC (premature ventricular contraction) 10/11/2014  . Back muscle spasm 06/15/2014  . Generalized anxiety disorder 06/15/2014  . Smoking 06/15/2014  . 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 [place] 05/28[place]  . Jaundice 05/04/2013  . Transaminitis 05/04/2013    DCarney LivingPT DPT  05/29/2016, 12:52 PM  [facility] [address]Phone: [phone]  Fax:  [phone] Name: VARNEDA SAPPINGTONMRN: [id number] of Birth: 307/30/1957

## 2016-06-06 ENCOUNTER — Encounter: Payer: Self-pay | Admitting: Physical Therapy

## 2016-06-13 ENCOUNTER — Encounter: Payer: Self-pay | Admitting: Physical Therapy

## 2016-06-13 ENCOUNTER — Ambulatory Visit: Payer: Self-pay | Attending: Orthopaedic Surgery | Admitting: Physical Therapy

## 2016-06-13 DIAGNOSIS — M6281 Muscle weakness (generalized): Secondary | ICD-10-CM | POA: Insufficient documentation

## 2016-06-13 DIAGNOSIS — M25532 Pain in left wrist: Secondary | ICD-10-CM | POA: Insufficient documentation

## 2016-06-13 DIAGNOSIS — M25632 Stiffness of left wrist, not elsewhere classified: Secondary | ICD-10-CM | POA: Insufficient documentation

## 2016-06-13 NOTE — Therapy (Signed)
[place], [place], [address] Phone: [phone]   Fax:  [phone]  Physical Therapy Treatment/Discharge Summary  Patient Details  Name: Kara Bailey MRN: [id number] Date of Birth: 11-06-1956 Referring Provider: Dr Frankey Shown   Encounter Date: 06/13/2016      PT End of Session - 06/13/16 0903    Visit Number 4   Number of Visits 16   Date for PT Re-Evaluation 06/19/16   Authorization Type CAFA    PT Start Time 0856  pt arrived late   PT Stop Time 0930   PT Time Calculation (min) 34 min   Activity Tolerance Patient tolerated treatment well   Behavior During Therapy [facility] for tasks assessed/performed      Past Medical History:  Diagnosis Date  . Anxiety   . Chest pain 08/2014   unspecified  . Dizziness 08/2014  . Dyspnea 08/2014  . GERD (gastroesophageal reflux disease)   . Hepatitis C   . Hepatitis C virus infection   . Hypertension   . PAC (premature atrial contraction) 10/11/2014  . Palpitations   . PVC (premature ventricular contraction) 10/11/2014  . Shingles    left side  . Tachycardia 08/2014  . Weakness 08/2014    Past Surgical History:  Procedure Laterality Date  . APPENDECTOMY    . COLONOSCOPY WITH PROPOFOL N/A 04/30/2016   Procedure: COLONOSCOPY WITH PROPOFOL;  Surgeon: Daneil Dolin, MD;  [facility];  Service: Endoscopy;  Laterality: N/A;  1230  . ESOPHAGOGASTRODUODENOSCOPY     approximately 2010  . ESOPHAGOGASTRODUODENOSCOPY (EGD) WITH PROPOFOL N/A 04/30/2016   Procedure: ESOPHAGOGASTRODUODENOSCOPY (EGD) WITH PROPOFOL;  Surgeon: Daneil Dolin, MD;  [facility];  Service: Endoscopy;  Laterality: N/A;  . LEFT HEART CATHETERIZATION WITH CORONARY ANGIOGRAM N/A 10/29/2014   Procedure: LEFT HEART CATHETERIZATION WITH CORONARY ANGIOGRAM;  Surgeon: Burnell Blanks, MD;  Location: [facility] CATH LAB;  Service: Cardiovascular;  Laterality: N/A;  . Venia Minks  DILATION N/A 04/30/2016   Procedure: Venia Minks DILATION;  Surgeon: Daneil Dolin, MD;  [facility];  Service: Endoscopy;  Laterality: N/A;  . POLYPECTOMY  04/30/2016   Procedure: POLYPECTOMY;  Surgeon: Daneil Dolin, MD;  [facility];  Service: Endoscopy;;  Sigmoid colon polyp removed via hot snare    There were no vitals filed for this visit.      Subjective Assessment - 06/13/16 0856    Subjective Pt reports her wrist is feeling good. Mostly out of the brace, wears with driving and lifting.    Currently in Pain? Yes   Pain Score 0-No pain  4/10 with heavy lifting which she does rarely   Pain Location Wrist   Pain Orientation Left   Pain Descriptors / Indicators Tender            [facility] PT Assessment - 06/13/16 0001      PROM   Left Wrist Extension 68 Degrees   Left Wrist Flexion 60 Degrees   Left Wrist Radial Deviation 16 Degrees   Left Wrist Ulnar Deviation 16 Degrees     Strength   Left Wrist Flexion 4/5   Left Wrist Extension 4/5   Left Wrist Radial Deviation 4+/5   Left Wrist Ulnar Deviation 4+/5                     [facility] Adult PT Treatment/Exercise - 06/13/16 0001      Wrist Exercises  Other wrist exercises red finger extension 2x10; towel sueeze 2x10; finger oppoisition 2x10 Biceps curl 2x10; 1lb   Other wrist exercises Standing scapular retration 2x10 yellow, shoulder extension 2x10 yellow.      Manual Therapy   Manual therapy comments Wrist and elbo PROM too improve ROM.                 PT Education - 06/13/16 0903    Education provided Yes   Education Details exercise form/rationale, HEP   Person(s) Educated Patient   Methods Explanation;Demonstration;Tactile cues;Verbal cues   Comprehension Verbalized understanding;Returned demonstration;Verbal cues required;Tactile cues required          PT Short Term Goals - 06/13/16 0904      PT SHORT TERM GOAL #1   Title Patient will be Independent with HEP     Status Achieved     PT SHORT TERM GOAL #2   Title Patient will increase left wrist flexion and extension by 20 degrees without increased pain    Baseline flx 60 deg, ext 68 deg   Status Achieved     PT SHORT TERM GOAL #3   Title Patient will increase left grip strength by 20 lbs    Baseline 10 lb   Status Not Met     PT SHORT TERM GOAL #4   Title Pt will report 3/10 pain at worst    Status Achieved     PT SHORT TERM GOAL #5   Title Patient will perfrom finger oppostion to every finger without pain    Status Achieved           PT Long Term Goals - 06/13/16 0911      PT LONG TERM GOAL #1   Title Patient will demsotrate 65 degrees of active left elobow supination without pain in order to hold objects   Baseline 65 deg   Status Achieved     PT LONG TERM GOAL #2   Title Patient will demsotrate 35 pounds of left grip strength in order to carry itesms    Status Not Met     PT LONG TERM GOAL #3   Title Patient will use her left arm for adls with > 7/02 pain in her wrist    Status Achieved     PT LONG TERM GOAL #4   Title "Pt will not wake due to pain while turning in bed while sleeping at night   Status Achieved     PT LONG TERM GOAL #5   Title --     PT LONG TERM GOAL #6   Title Pt will be able to grocery shop for 1 hour without dependence of utilizing grocery cart for UE support   Status Achieved     PT LONG TERM GOAL #7   Title --   Status --               Plan - 06/13/16 1000    Clinical Impression Statement Pt has made significant improvement toward goals and verbalizes ability to perform ADLs with minimal to no limitations by wrist. Pt reports readiness to d/c to independent program and was able to demo exercises with appropriate form. Was instructed to contact us with any future questions or needs.    Consulted and Agree with Plan of Care Patient      Patient will benefit from skilled therapeutic intervention in order to improve the following  deficits and impairments:     Visit Diagnosis: Pain in left wrist  Muscle weakness (generalized)  Stiffness of left wrist, not elsewhere classified     Problem List Patient Active Problem List   Diagnosis Date Noted  . Peptic stricture of esophagus   . History of colonic polyps   . Hiatal hernia 03/16/2016  . Odynophagia 03/16/2016  . Dysphagia 03/16/2016  . Alcohol abuse 10/26/2015  . HTN (hypertension) 09/26/2015  . Chest pain 10/14/2014  . PAC (premature atrial contraction) 10/11/2014  . PVC (premature ventricular contraction) 10/11/2014  . Back muscle spasm 06/15/2014  . Generalized anxiety disorder 06/15/2014  . Smoking 06/15/2014  . 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 Chronic hepatitis C without hepatic coma ([place]) 05/28/2013  . Jaundice 05/04/2013  . Transaminitis 05/04/2013    PHYSICAL THERAPY DISCHARGE SUMMARY  Visits from Start of Care: 4  Current functional level related to goals / functional outcomes: See above   Remaining deficits: See above   Education / Equipment: Anatomy of condition, POC, HEP, exercise form/rationale  Plan: Patient agrees to discharge.  Patient goals were partially met. Patient is being discharged due to being pleased with the current functional level.  ?????       Cathyann Kilfoyle C. Syvanna Ciolino PT, DPT 06/13/16 10:09 AM   [facility] [address] [place], [place], [address] Phone: [phone]   Fax:  [phone]  Name: Kara Bailey MRN: [id number] Date of Birth: [birth year]

## 2016-06-19 ENCOUNTER — Telehealth: Payer: Self-pay | Admitting: Family Medicine

## 2016-06-19 DIAGNOSIS — F411 Generalized anxiety disorder: Secondary | ICD-10-CM

## 2016-06-19 MED ORDER — ALPRAZOLAM 0.5 MG PO TABS: 0.5000 mg | ORAL_TABLET | Freq: Every evening | ORAL | 2 refills | Status: DC | PRN

## 2016-06-19 NOTE — Telephone Encounter (Signed)
Pt called requesting medication refill on ALPRAZolam (XANAX) 0.5 MG tablet, please f/up

## 2016-06-19 NOTE — Telephone Encounter (Signed)
Patient was called and informed of prescription being ready for pick up

## 2016-06-19 NOTE — Telephone Encounter (Signed)
Xanax ready for pick up

## 2016-07-20 ENCOUNTER — Ambulatory Visit: Payer: Self-pay | Attending: Internal Medicine

## 2016-07-31 ENCOUNTER — Ambulatory Visit: Payer: Self-pay | Admitting: Nurse Practitioner

## 2016-08-29 ENCOUNTER — Encounter: Payer: Self-pay | Admitting: Family Medicine

## 2016-08-29 ENCOUNTER — Ambulatory Visit: Payer: Self-pay | Attending: Family Medicine | Admitting: Family Medicine

## 2016-08-29 VITALS — BP 145/97 | HR 91 | Temp 98.7°F | Ht 66.0 in | Wt 114.9 lb

## 2016-08-29 DIAGNOSIS — R630 Anorexia: Secondary | ICD-10-CM | POA: Insufficient documentation

## 2016-08-29 DIAGNOSIS — Z23 Encounter for immunization: Secondary | ICD-10-CM | POA: Insufficient documentation

## 2016-08-29 DIAGNOSIS — I10 Essential (primary) hypertension: Secondary | ICD-10-CM | POA: Insufficient documentation

## 2016-08-29 DIAGNOSIS — F17200 Nicotine dependence, unspecified, uncomplicated: Secondary | ICD-10-CM

## 2016-08-29 DIAGNOSIS — Z79899 Other long term (current) drug therapy: Secondary | ICD-10-CM | POA: Insufficient documentation

## 2016-08-29 DIAGNOSIS — F17210 Nicotine dependence, cigarettes, uncomplicated: Secondary | ICD-10-CM | POA: Insufficient documentation

## 2016-08-29 DIAGNOSIS — F411 Generalized anxiety disorder: Secondary | ICD-10-CM | POA: Insufficient documentation

## 2016-08-29 MED ORDER — DILTIAZEM HCL ER COATED BEADS 180 MG PO CP24: 180.0000 mg | ORAL_CAPSULE | Freq: Every day | ORAL | 2 refills | Status: DC

## 2016-08-29 MED ORDER — ALBUTEROL SULFATE HFA 108 (90 BASE) MCG/ACT IN AERS: 2.0000 | INHALATION_SPRAY | Freq: Four times a day (QID) | RESPIRATORY_TRACT | 0 refills | Status: DC | PRN

## 2016-08-29 MED ORDER — VARENICLINE TARTRATE 1 MG PO TABS: 1.0000 mg | ORAL_TABLET | Freq: Two times a day (BID) | ORAL | 2 refills | Status: DC

## 2016-08-29 MED ORDER — VARENICLINE TARTRATE 0.5 MG X 11 & 1 MG X 42 PO MISC
ORAL | 0 refills | Status: DC
Start: 2016-08-29 — End: 2016-10-29

## 2016-08-29 MED ORDER — DIAZEPAM 5 MG PO TABS: 5.0000 mg | ORAL_TABLET | Freq: Two times a day (BID) | ORAL | 2 refills | Status: DC | PRN

## 2016-08-29 MED ORDER — MIRTAZAPINE 15 MG PO TABS: 15.0000 mg | ORAL_TABLET | Freq: Every day | ORAL | 2 refills | Status: DC

## 2016-08-29 NOTE — Progress Notes (Signed)
Subjective:  Patient ID: Kara Bailey, female    DOB: July 04, 1956  Age: 60 y.o. MRN: [id number]  CC: Hypertension   HPI Kara Bailey has HTN, hep C treated, smoker, ETOH abuse, anxiety she presents for   1. Chronic HTN: taking metoprolol. Reports she is having trouble swallowing the pill. She would like the dose reduced. No HA, CP, swelling. Has shortness of breath.   2. Odynophagia and dysphagia:  She underwent EGD and esophageal dilatation on 04/30/16.  Results:  - LA Grade A esophagitis. - Benign-appearing esophageal stenosis. Dilated. - The examination was otherwise normal. - Medium-sized hiatal hernia. - The examination was otherwise normal. - Normal second portion of the duodenum. - No specimens collected.  Her symptoms have improved but have not completely resolved. She still has trouble swallowing pills. She still smokes and drinks alcohol. She desires to quit smoking. She has cut back on alcohol intake. She admits to poor appetite as well.   3. Depressed and anxious mood: she is still anxious. She is also depressed. She felt that xanax helped with anxiety but she is taking it twice daily. She is having trouble sleeping. She has poor energy. She has poor appetite.   4. Smoking: desires to quit. Has SOB with activity. No fever, chills or chest pain. Has intermittent non-productive cough.   Social History  Substance Use Topics  . Smoking status: Current Every Day Smoker    Packs/day: 0.25    Types: Cigarettes    Start date: 11/12/1968  . Smokeless tobacco: Never Used     Comment: cutting back, wearing patches  . Alcohol use 0.0 oz/week     Comment: beer on weekends (6-pack)   Outpatient Medications Prior to Visit  Medication Sig Dispense Refill  . ALPRAZolam (XANAX) 0.5 MG tablet Take 1 tablet (0.5 mg total) by mouth at bedtime as needed for anxiety. 30 tablet 2  . FLUoxetine (PROZAC) 20 MG tablet Take 1 tablet (20 mg total) by mouth daily. 30 tablet 3  .  HYDROcodone-acetaminophen (NORCO/VICODIN) 5-325 MG tablet Take 1 tablet by mouth every 6 (six) hours as needed.  0  . metoprolol succinate (TOPROL-XL) 100 MG 24 hr tablet Take 1 tablet (100 mg total) by mouth daily. Take with or immediately following a meal. 30 tablet 5  . naproxen (NAPROSYN) 500 MG tablet Take 1 tablet (500 mg total) by mouth 2 (two) times daily with a meal. 30 tablet 0  . pantoprazole (PROTONIX) 40 MG tablet Take 1 tablet (40 mg total) by mouth daily. 30 tablet 5   No facility-administered medications prior to visit.     ROS Review of Systems  Constitutional: Negative for chills and fever.  HENT: Positive for trouble swallowing.   Eyes: Negative for visual disturbance.  Respiratory: Positive for cough and shortness of breath.   Cardiovascular: Negative for chest pain.  Gastrointestinal: Negative for abdominal pain and blood in stool.  Musculoskeletal: Positive for myalgias. Negative for arthralgias and back pain. Neck pain: cramps in legs   Skin: Negative for rash.  Allergic/Immunologic: Negative for immunocompromised state.  Neurological: Negative for tremors, facial asymmetry, speech difficulty and weakness.  Hematological: Negative for adenopathy. Does not bruise/bleed easily.  Psychiatric/Behavioral: Positive for dysphoric mood and sleep disturbance. Negative for suicidal ideas. The patient is nervous/anxious.     Objective:  BP (!) 145/97 (BP Location: Left Arm, Patient Position: Sitting, Cuff Size: Small)   Pulse 91   Temp 98.7 F (37.1 C) (Oral)  Ht 5\' 6"  (1.676 m)   Wt 114 lb 14.4 oz (52.1 kg)   SpO2 99%   BMI 18.55 kg/m   BP/Weight 08/29/2016 04/30/2016 [id number]  Systolic BP [id number]  Diastolic BP 97 90 72  Wt. (Lbs) 114.9 - 111  BMI 18.55 - 17.92   Wt Readings from Last 3 Encounters:  08/29/16 114 lb 14.4 oz (52.1 kg)  04/26/16 111 lb (50.3 kg)  04/12/16 112 lb (50.8 kg)    Physical Exam  Constitutional: She is oriented to person,  place, and time. She appears well-developed and well-nourished. She appears cachectic. She has a sickly appearance. No distress.  HENT:  Head: Normocephalic and atraumatic.  Cardiovascular: Normal rate, regular rhythm, normal heart sounds and intact distal pulses.   Pulses:      Dorsalis pedis pulses are 2+ on the right side, and 2+ on the left side.  Pulmonary/Chest: Effort normal and breath sounds normal.  Musculoskeletal: She exhibits no edema.  Neurological: She is alert and oriented to person, place, and time.  Skin: Skin is warm and dry. No rash noted.  Psychiatric: She has a normal mood and affect.   Assessment & Plan:   Kara Bailey was seen today for hypertension.  Diagnoses and all orders for this visit:  Essential hypertension -     diltiazem (CARDIZEM CD) 180 MG 24 hr capsule; Take 1 capsule (180 mg total) by mouth daily.  Smoking -     varenicline (CHANTIX STARTING MONTH PAK) 0.5 MG X 11 & 1 MG X 42 tablet; Tape per packet insert. -     varenicline (CHANTIX CONTINUING MONTH PAK) 1 MG tablet; Take 1 tablet (1 mg total) by mouth 2 (two) times daily. -     albuterol (PROVENTIL HFA;VENTOLIN HFA) 108 (90 Base) MCG/ACT inhaler; Inhale 2 puffs into the lungs every 6 (six) hours as needed for wheezing or shortness of breath. -     Pulmonary function test; Future  Generalized anxiety disorder -     diazepam (VALIUM) 5 MG tablet; Take 1 tablet (5 mg total) by mouth every 12 (twelve) hours as needed for anxiety.  Poor appetite -     mirtazapine (REMERON) 15 MG tablet; Take 1 tablet (15 mg total) by mouth at bedtime.  Encounter for immunization -     Flu Vaccine QUAD 36+ mos IM  Other orders -     Cancel: carvedilol (COREG) 6.25 MG tablet; Take 1 tablet (6.25 mg total) by mouth 2 (two) times daily with a meal.   No orders of the defined types were placed in this encounter.   Follow-up: Return in about 2 weeks (around 09/12/2016) for HTN .   Boykin Nearing MD

## 2016-08-29 NOTE — Progress Notes (Signed)
Pt is getting flu shot today. 

## 2016-08-29 NOTE — Patient Instructions (Addendum)
Kara Bailey was seen today for hypertension.  Diagnoses and all orders for this visit:  Essential hypertension -     diltiazem (CARDIZEM CD) 180 MG 24 hr capsule; Take 1 capsule (180 mg total) by mouth daily.  Smoking -     varenicline (CHANTIX STARTING MONTH PAK) 0.5 MG X 11 & 1 MG X 42 tablet; Tape per packet insert. -     varenicline (CHANTIX CONTINUING MONTH PAK) 1 MG tablet; Take 1 tablet (1 mg total) by mouth 2 (two) times daily. -     albuterol (PROVENTIL HFA;VENTOLIN HFA) 108 (90 Base) MCG/ACT inhaler; Inhale 2 puffs into the lungs every 6 (six) hours as needed for wheezing or shortness of breath. -     Pulmonary function test; Future  Generalized anxiety disorder -     diazepam (VALIUM) 5 MG tablet; Take 1 tablet (5 mg total) by mouth every 12 (twelve) hours as needed for anxiety.  Poor appetite -     mirtazapine (REMERON) 15 MG tablet; Take 1 tablet (15 mg total) by mouth at bedtime.  Other orders -     Cancel: carvedilol (COREG) 6.25 MG tablet; Take 1 tablet (6.25 mg total) by mouth 2 (two) times daily with a meal.  stop metoprolol and stop xanax Replace with diltiazem and valium Increase intake of water for leg cramps   F/u in 2 weeks for BP check with clinical pharmacologist  F/u with me in 4 weeks for HTN, anxiety, poor appetite and poor sleep   Dr. Adrian Blackwater

## 2016-08-30 NOTE — Assessment & Plan Note (Signed)
Persistent anxiety Valium ordered

## 2016-08-30 NOTE — Assessment & Plan Note (Signed)
Poor appetite, insomnia and depressed mood Remeron started

## 2016-08-30 NOTE — Assessment & Plan Note (Signed)
A: HTN with hx of PACs and PVCs. Not tolerating metoprolol. Patient with likely COPD so cardioselective beta blocker preferred.   P: Stop metoprolol Change to diltiazem

## 2016-08-30 NOTE — Assessment & Plan Note (Signed)
Current smoker with cough and SOB with activity concerning for COPD  Smoking cessation counseling done chantix Albuterol PFTs

## 2016-09-03 ENCOUNTER — Encounter: Payer: Self-pay | Admitting: Nurse Practitioner

## 2016-09-03 ENCOUNTER — Encounter: Payer: Self-pay | Admitting: Internal Medicine

## 2016-09-03 ENCOUNTER — Ambulatory Visit (INDEPENDENT_AMBULATORY_CARE_PROVIDER_SITE_OTHER): Payer: Self-pay | Admitting: Nurse Practitioner

## 2016-09-03 VITALS — BP 152/90 | HR 91 | Temp 98.3°F | Ht 66.0 in | Wt 117.8 lb

## 2016-09-03 DIAGNOSIS — R1310 Dysphagia, unspecified: Secondary | ICD-10-CM

## 2016-09-03 DIAGNOSIS — R197 Diarrhea, unspecified: Secondary | ICD-10-CM

## 2016-09-03 NOTE — Assessment & Plan Note (Signed)
The patient complains of significant loose stools, at least 7 a day, which are persistent since her colonoscopy. I will check a C. difficile PCR and GI pathogen panel to rule out infection due to recent colonoscopy. If this comes back negative, we can start her on a daily probiotic as well as Imodium as needed. Return for follow-up in 2 months. She is to call us if her symptoms become worse. I will also check a CBC for signs of infection.

## 2016-09-03 NOTE — Progress Notes (Signed)
Referring Provider: Boykin Nearing, MD Primary Care Physician:  Minerva Ends, MD Primary GI:  Dr. Gala Romney   Chief Complaint  Patient presents with  . Follow-up    has some problems swallowing (fried meats gets hung and comes back up), BM "squirts" since having TCS    HPI:   Kara Bailey is a 60 y.o. female who presents for post-procedure follow-up. She was last seen in our office 04/12/2016 for dysphagia, chronic hepatitis C, odynophagia. At that time she noted worsening dysphagia over the previous year, worse with pills and meats. GERD symptoms currently on Protonix but unable to take it every day due to pill dysphagia. No other GI complaints. At that time she was under treatment for hepatitis C and her previous RNA quantitative was undetectable with a couple weeks remaining on her treatment with infectious disease.  She noted that she stopped taking her HCV medications due to pill dysphagia. I encouraged her to return to them and inform them. I also sent a message via Epic in order to notify them. Previous records are requested. She was arranged for endoscopy with possible dilation. Previous endoscopic records reviewed when received and noted she was due for repeat colonoscopy in September 2014 and was therefore 2 years overdue. She was arranged for colonoscopy in addition to her upper endoscopy with possible dilation.  Both procedures were performed on 04/30/2016. EGD found LA grade a esophagitis, benign-appearing esophageal stenosis status post dilation, otherwise normal. Medium size hiatal hernia. No specimens collected. Recommended increased Protonix to 40 mg twice daily. Colonoscopy found a 9 mm polyp in the sigmoid colon which was semi-pedunculated. Surgical pathology found the polyp to be tubular adenoma. Recommended 12 week follow-up in office and repeat colonoscopy in 5 years (2022).  Today she states she had a great experience with her procedures. Overall doing pretty  good. Is having some loose stools persistently since colonoscopy; "more than 7." Has some abdominal pain and gas/bloating. Denies fever, chills, N/V. Has dysphagia symptoms with fried food, but is only occasional and improved with use of sauces and gravies to soften the meat. Is swallowing "a whole lot better than it was." Denies chest pain, dizziness, lightheadedness, syncope, near syncope. Denies any other upper or lower GI symptoms. Is having some dyspnea, has an appointment with pulmonology next week.  Past Medical History:  Diagnosis Date  . Anxiety   . Chest pain 08/2014   unspecified  . Dizziness 08/2014  . Dyspnea 08/2014  . GERD (gastroesophageal reflux disease)   . Hepatitis C   . Hepatitis C virus infection   . Hypertension   . PAC (premature atrial contraction) 10/11/2014  . Palpitations   . PVC (premature ventricular contraction) 10/11/2014  . Shingles    left side  . Tachycardia 08/2014  . Weakness 08/2014    Past Surgical History:  Procedure Laterality Date  . APPENDECTOMY    . COLONOSCOPY WITH PROPOFOL N/A 04/30/2016   Procedure: COLONOSCOPY WITH PROPOFOL;  Surgeon: Daneil Dolin, MD;  Location: [facility];  Service: Endoscopy;  Laterality: N/A;  1230  . ESOPHAGOGASTRODUODENOSCOPY     approximately 2010  . ESOPHAGOGASTRODUODENOSCOPY (EGD) WITH PROPOFOL N/A 04/30/2016   Procedure: ESOPHAGOGASTRODUODENOSCOPY (EGD) WITH PROPOFOL;  Surgeon: Daneil Dolin, MD;  Location: [facility];  Service: Endoscopy;  Laterality: N/A;  . LEFT HEART CATHETERIZATION WITH CORONARY ANGIOGRAM N/A 10/29/2014   Procedure: LEFT HEART CATHETERIZATION WITH CORONARY ANGIOGRAM;  Surgeon: Burnell Blanks, MD;  Location: [facility]  CATH LAB;  Service: Cardiovascular;  Laterality: N/A;  . Venia Minks DILATION N/A 04/30/2016   Procedure: Venia Minks DILATION;  Surgeon: Daneil Dolin, MD;  Location: [facility];  Service: Endoscopy;  Laterality: N/A;  . POLYPECTOMY  04/30/2016   Procedure: POLYPECTOMY;   Surgeon: Daneil Dolin, MD;  Location: [facility];  Service: Endoscopy;;  Sigmoid colon polyp removed via hot snare    Current Outpatient Prescriptions  Medication Sig Dispense Refill  . ALPRAZolam (XANAX) 0.5 MG tablet Take 0.5 mg by mouth at bedtime.  0  . diltiazem (CARDIZEM CD) 180 MG 24 hr capsule Take 1 capsule (180 mg total) by mouth daily. 30 capsule 2  . FLUoxetine (PROZAC) 20 MG tablet Take 1 tablet (20 mg total) by mouth daily. (Patient taking differently: Take 20 mg by mouth as needed. ) 30 tablet 3  . metoprolol succinate (TOPROL-XL) 100 MG 24 hr tablet Take 1 tablet (100 mg total) by mouth daily. Take with or immediately following a meal. 30 tablet 5  . pantoprazole (PROTONIX) 40 MG tablet Take 1 tablet (40 mg total) by mouth daily. 30 tablet 5  . diazepam (VALIUM) 5 MG tablet Take 1 tablet (5 mg total) by mouth every 12 (twelve) hours as needed for anxiety. (Patient not taking: Reported on 09/03/2016) 60 tablet 2  . HYDROcodone-acetaminophen (NORCO/VICODIN) 5-325 MG tablet Take 1 tablet by mouth every 6 (six) hours as needed.  0  . mirtazapine (REMERON) 15 MG tablet Take 1 tablet (15 mg total) by mouth at bedtime. (Patient not taking: Reported on 09/03/2016) 30 tablet 2  . varenicline (CHANTIX CONTINUING MONTH PAK) 1 MG tablet Take 1 tablet (1 mg total) by mouth 2 (two) times daily. (Patient not taking: Reported on 09/03/2016) 60 tablet 2  . varenicline (CHANTIX STARTING MONTH PAK) 0.5 MG X 11 & 1 MG X 42 tablet Tape per packet insert. (Patient not taking: Reported on 09/03/2016) 53 tablet 0   No current facility-administered medications for this visit.     Allergies as of 09/03/2016  . (No Known Allergies)    Family History  Problem Relation Age of Onset  . Heart failure Mother   . Heart attack Mother   . Heart disease Mother   . Hypertension Mother   . Hyperlipidemia Mother   . CVA Father   . Alzheimer's disease Father   . Cancer Neg Hx   . Colon cancer Neg Hx       Social History   Social History  . Marital status: Divorced    Spouse name: N/A  . Number of children: N/A  . Years of education: N/A   Social History Main Topics  . Smoking status: Current Every Day Smoker    Packs/day: 0.50    Types: Cigarettes    Start date: 11/12/1968  . Smokeless tobacco: Never Used     Comment: cutting back, wearing patches  . Alcohol use 0.0 oz/week     Comment: beer on weekends (6-pack)  . Drug use: No  . Sexual activity: Not Asked   Other Topics Concern  . None   Social History Narrative  . None    Review of Systems: General: Negative for anorexia, weight loss, fever, chills, fatigue, weakness. ENT: Negative for hoarseness. CV: Negative for chest pain, angina, palpitations, peripheral edema.  Respiratory: Negative for dyspnea at rest, cough, sputum, wheezing.  GI: See history of present illness. MS: Admits chronic back pain.  Derm: Negative for rash or itching.  Endo:  Negative for unusual weight change.  Allergy: Negative for rash or hives.   Physical Exam: BP (!) 152/90   Pulse 91   Temp 98.3 F (36.8 C) (Oral)   Ht 5\' 6"  (1.676 m)   Wt 117 lb 12.8 oz (53.4 kg)   BMI 19.01 kg/m  General:   Alert and oriented. Pleasant and cooperative. Well-nourished and well-developed.  Ears:  Normal auditory acuity. Cardiovascular:  S1, S2 present without murmurs appreciated. Extremities without clubbing or edema. Respiratory:  Clear to auscultation bilaterally. No wheezes, rales, or rhonchi. No distress.  Gastrointestinal:  +BS, soft, non-tender and non-distended. No HSM noted. No guarding or rebound. No masses appreciated.  Rectal:  Deferred  Musculoskalatal:  Symmetrical without gross deformities. Hobbling gait Neurologic:  Alert and oriented x4;  grossly normal neurologically. Psych:  Alert and cooperative. Normal mood and affect. Heme/Lymph/Immune: No excessive bruising noted.    09/03/2016 12:02 PM   Disclaimer: This note was  dictated with voice recognition software. Similar sounding words can inadvertently be transcribed and may not be corrected upon review.

## 2016-09-03 NOTE — Assessment & Plan Note (Signed)
Dysphagia symptoms much improved. Occasional symptoms still with fried foods but she can prevent this by using sauces, greatest to soften the fried meats. Recommend she avoid triggers. Return for follow-up in 2 months.

## 2016-09-03 NOTE — Patient Instructions (Signed)
1. When you collect her stool samples bring him to the lab. 2. When you bring her stool samples to the lab, you can have your blood test done at the same time. 3. We will call you with the results. 4. If your test showed that he did not have an infection we will have you start a probiotic for 30 days and use Imodium over-the-counter for diarrhea. 5. Call us if your symptoms become worse. 6. Return for follow-up in 2 weeks.

## 2016-09-05 ENCOUNTER — Encounter (HOSPITAL_COMMUNITY): Payer: Self-pay

## 2016-09-12 ENCOUNTER — Encounter: Payer: Self-pay | Admitting: Pharmacist

## 2016-09-13 MED FILL — VENTOLIN HFA 90 MCG INHALER: 108 (90 BAS | 20 days supply | Qty: 18 | Fill #0

## 2016-09-13 MED FILL — CARTIA XT 180 MG CAPSULE SA: 180 | 30 days supply | Qty: 30 | Fill #0

## 2016-09-13 MED FILL — MIRTAZAPINE 15 MG TABLET: 15 | 30 days supply | Qty: 30 | Fill #0

## 2016-09-24 ENCOUNTER — Encounter (HOSPITAL_COMMUNITY): Payer: Self-pay

## 2016-09-25 ENCOUNTER — Ambulatory Visit: Payer: Self-pay | Attending: Internal Medicine | Admitting: Pharmacist

## 2016-09-25 ENCOUNTER — Encounter: Payer: Self-pay | Admitting: Pharmacist

## 2016-09-25 VITALS — BP 126/85 | HR 90 | Wt 122.0 lb

## 2016-09-25 DIAGNOSIS — I10 Essential (primary) hypertension: Secondary | ICD-10-CM | POA: Insufficient documentation

## 2016-09-25 DIAGNOSIS — Z79899 Other long term (current) drug therapy: Secondary | ICD-10-CM | POA: Insufficient documentation

## 2016-09-25 NOTE — Patient Instructions (Addendum)
Thanks for coming to see me!  Your blood pressure looks great!!  Follow up with Dr. Adrian Blackwater as directed.   Hypertension Hypertension is another name for high blood pressure. High blood pressure forces your heart to work harder to pump blood. A blood pressure reading has two numbers, which includes a higher number over a lower number (example: 110/72). Follow these instructions at home:  Have your blood pressure rechecked by your doctor.  Only take medicine as told by your doctor. Follow the directions carefully. The medicine does not work as well if you skip doses. Skipping doses also puts you at risk for problems.  Do not smoke.  Monitor your blood pressure at home as told by your doctor. Contact a doctor if:  You think you are having a reaction to the medicine you are taking.  You have repeat headaches or feel dizzy.  You have puffiness (swelling) in your ankles.  You have trouble with your vision. Get help right away if:  You get a very bad headache and are confused.  You feel weak, numb, or faint.  You get chest or belly (abdominal) pain.  You throw up (vomit).  You cannot breathe very well. This information is not intended to replace advice given to you by your health care provider. Make sure you discuss any questions you have with your health care provider. Document Released: 04/16/2008 Document Revised: 04/05/2016 Document Reviewed: 08/21/2013 Elsevier Interactive Patient Education  2017 [employer].

## 2016-09-25 NOTE — Progress Notes (Signed)
   S:    Patient arrives in good spirits.    Presents to the clinic for hypertension evaluation. Patient was referred on 08/29/16 by Dr. Adrian Blackwater.  Patient was last seen by Primary Care Provider on 08/29/16.   Patient reports adherence with medications. She denies a  Current BP Medications include:  Diltiazem 180 mg daily  Antihypertensives tried in the past include: metoprolol succinate 100 mg daily (stopped due to pill being too large to swallow).   O:   Last 3 Office BP readings: BP Readings from Last 3 Encounters:  09/25/16 126/85  09/03/16 (!) 152/90  08/29/16 (!) 145/97    BMET    Component Value Date/Time   NA 131 (L) 04/27/2016 1039   K 4.0 04/27/2016 1039   CL 102 04/27/2016 1039   CO2 20 04/27/2016 1039   GLUCOSE 72 04/27/2016 1039   BUN 5 (L) 04/27/2016 1039   CREATININE 0.74 04/27/2016 1039   CALCIUM 9.5 04/27/2016 1039   GFRNONAA 87 04/27/2016 1039   GFRAA >89 04/27/2016 1039    A/P: Hypertension longstanding currently controlled on current medications.  Continued all medications as prescribed. Congratulated patient on blood pressure and weight gain.   Results reviewed and written information provided.   Total time in face-to-face counseling 15 minutes.   F/U Clinic Visit with Dr. Adrian Blackwater.  Patient seen with Biagio Borg, PharmD Candidate

## 2016-10-08 MED FILL — MIRTAZAPINE 15 MG TABLET: 15 | 30 days supply | Qty: 30 | Fill #1

## 2016-10-11 ENCOUNTER — Telehealth: Payer: Self-pay | Admitting: Family Medicine

## 2016-10-11 ENCOUNTER — Encounter: Payer: Self-pay | Admitting: Family Medicine

## 2016-10-11 ENCOUNTER — Ambulatory Visit: Payer: Self-pay | Attending: Family Medicine | Admitting: Family Medicine

## 2016-10-11 VITALS — BP 112/78 | HR 83 | Temp 98.6°F | Resp 16 | Ht 66.0 in | Wt 124.0 lb

## 2016-10-11 DIAGNOSIS — R1310 Dysphagia, unspecified: Secondary | ICD-10-CM | POA: Insufficient documentation

## 2016-10-11 DIAGNOSIS — Z79899 Other long term (current) drug therapy: Secondary | ICD-10-CM | POA: Insufficient documentation

## 2016-10-11 DIAGNOSIS — F17210 Nicotine dependence, cigarettes, uncomplicated: Secondary | ICD-10-CM | POA: Insufficient documentation

## 2016-10-11 DIAGNOSIS — I10 Essential (primary) hypertension: Secondary | ICD-10-CM | POA: Insufficient documentation

## 2016-10-11 DIAGNOSIS — F419 Anxiety disorder, unspecified: Secondary | ICD-10-CM | POA: Insufficient documentation

## 2016-10-11 NOTE — Telephone Encounter (Signed)
Pt calling stating she is filing for disability and wants to make sure PCP has noted that pt has back pain and cannot lift anything over 10 pounds

## 2016-10-11 NOTE — Patient Instructions (Signed)
Kara Bailey was seen today for hypertension.  Diagnoses and all orders for this visit:  Essential hypertension   Your blood pressure is great  Continue current regimen  F/u in 3 months for HTN  Dr. Adrian Blackwater

## 2016-10-11 NOTE — Telephone Encounter (Signed)
Will route to PCP 

## 2016-10-11 NOTE — Progress Notes (Addendum)
Subjective:  Patient ID: Kara Bailey, female    DOB: 02-20-1956  Age: 60 y.o. MRN: [id number]  CC: Hypertension   HPI Kara Bailey has HTN, hep C treated, smoker, ETOH abuse, anxiety she presents for   1. Chronic HTN: taking diltiazem.  2. Odynophagia and dysphagia:  She underwent EGD and esophageal dilatation on 04/30/16.  Results:  - LA Grade A esophagitis. - Benign-appearing esophageal stenosis. Dilated. - The examination was otherwise normal. - Medium-sized hiatal hernia. - The examination was otherwise normal. - Normal second portion of the duodenum. - No specimens collected.  Her symptoms have improved.  Social History  Substance Use Topics  . Smoking status: Current Every Day Smoker    Packs/day: 0.50    Types: Cigarettes    Start date: 11/12/1968  . Smokeless tobacco: Never Used     Comment: cutting back, wearing patches  . Alcohol use 0.0 oz/week     Comment: beer on weekends (6-pack)   Outpatient Medications Prior to Visit  Medication Sig Dispense Refill  . diazepam (VALIUM) 5 MG tablet Take 1 tablet (5 mg total) by mouth every 12 (twelve) hours as needed for anxiety. (Patient not taking: Reported on 09/03/2016) 60 tablet 2  . diltiazem (CARDIZEM CD) 180 MG 24 hr capsule Take 1 capsule (180 mg total) by mouth daily. 30 capsule 2  . FLUoxetine (PROZAC) 20 MG tablet Take 1 tablet (20 mg total) by mouth daily. (Patient taking differently: Take 20 mg by mouth as needed. ) 30 tablet 3  . HYDROcodone-acetaminophen (NORCO/VICODIN) 5-325 MG tablet Take 1 tablet by mouth every 6 (six) hours as needed.  0  . mirtazapine (REMERON) 15 MG tablet Take 1 tablet (15 mg total) by mouth at bedtime. (Patient not taking: Reported on 09/03/2016) 30 tablet 2  . pantoprazole (PROTONIX) 40 MG tablet Take 1 tablet (40 mg total) by mouth daily. 30 tablet 5  . varenicline (CHANTIX CONTINUING MONTH PAK) 1 MG tablet Take 1 tablet (1 mg total) by mouth 2 (two) times daily. (Patient not  taking: Reported on 09/03/2016) 60 tablet 2  . varenicline (CHANTIX STARTING MONTH PAK) 0.5 MG X 11 & 1 MG X 42 tablet Tape per packet insert. (Patient not taking: Reported on 09/03/2016) 53 tablet 0   No facility-administered medications prior to visit.     ROS Review of Systems  Constitutional: Negative for chills and fever.  HENT: Positive for trouble swallowing.   Eyes: Negative for visual disturbance.  Respiratory: Positive for cough and shortness of breath.   Cardiovascular: Negative for chest pain.  Gastrointestinal: Negative for abdominal pain and blood in stool.  Musculoskeletal: Positive for back pain and myalgias. Negative for arthralgias (pain with lifting > 10 lbs). Neck pain: cramps in legs   Skin: Negative for rash.  Allergic/Immunologic: Negative for immunocompromised state.  Neurological: Negative for tremors, facial asymmetry, speech difficulty and weakness.  Hematological: Negative for adenopathy. Does not bruise/bleed easily.  Psychiatric/Behavioral: Positive for dysphoric mood and sleep disturbance. Negative for suicidal ideas. The patient is nervous/anxious.     Objective:  BP 112/78 (BP Location: Right Arm, Patient Position: Sitting, Cuff Size: Small)   Pulse 83   Temp 98.6 F (37 C) (Oral)   Resp 16   Ht 5\' 6"  (1.676 m)   Wt 124 lb (56.2 kg)   SpO2 95%   BMI 20.01 kg/m   BP/Weight 10/11/2016 09/25/2016 0000000  Systolic BP [id number] 0000000  Diastolic BP 78 85 90  Wt. (Lbs) 124 122 117.8  BMI 20.01 19.69 19.01   Wt Readings from Last 3 Encounters:  10/11/16 124 lb (56.2 kg)  09/25/16 122 lb (55.3 kg)  09/03/16 117 lb 12.8 oz (53.4 kg)    Physical Exam  Constitutional: She is oriented to person, place, and time. She appears well-developed and well-nourished. She appears cachectic. She has a sickly appearance. No distress.  HENT:  Head: Normocephalic and atraumatic.  Cardiovascular: Normal rate, regular rhythm, normal heart sounds and intact distal  pulses.   Pulmonary/Chest: Effort normal and breath sounds normal.  Musculoskeletal: She exhibits no edema.  Neurological: She is alert and oriented to person, place, and time.  Skin: Skin is warm and dry. No rash noted.  Psychiatric: She has a normal mood and affect.   Assessment & Plan:   Kara Bailey was seen today for hypertension.  Diagnoses and all orders for this visit:  Essential hypertension   No orders of the defined types were placed in this encounter.   Follow-up: Return in about 3 months (around 01/09/2017) for HTN .   Boykin Nearing MD

## 2016-10-12 NOTE — Assessment & Plan Note (Signed)
Well controlled Compliant with diltiazem Continue current regimen

## 2016-10-15 NOTE — Telephone Encounter (Signed)
Noted  

## 2016-10-16 ENCOUNTER — Encounter: Payer: Self-pay | Admitting: Nurse Practitioner

## 2016-10-22 ENCOUNTER — Telehealth: Payer: Self-pay | Admitting: Internal Medicine

## 2016-10-22 ENCOUNTER — Ambulatory Visit: Payer: Self-pay | Attending: Family Medicine

## 2016-10-22 MED FILL — CARTIA XT 180 MG CAPSULE SA: 180 | 30 days supply | Qty: 30 | Fill #1

## 2016-10-22 NOTE — Telephone Encounter (Signed)
There is a [facility] and a [facility] on [address] in [place]. I tried to call pt- NA-LMOM asked her to call back and let us know which one she is going to. If it is [facility], they should have the orders in their system, if its [facility], it will need to be faxed. Please ask her which one when she calls back.

## 2016-10-22 NOTE — Telephone Encounter (Signed)
Pt called to say that she was going to drop off her stool sample at the lab in [place] on [facility] and wanted to know if we could fax her lab orders there since she lost hers.

## 2016-10-22 NOTE — Telephone Encounter (Signed)
Patient called back to say she found her papers and the nurse had written the address for [facility] on it.

## 2016-10-24 ENCOUNTER — Ambulatory Visit: Payer: Self-pay | Attending: Family Medicine

## 2016-10-29 ENCOUNTER — Other Ambulatory Visit: Payer: Self-pay

## 2016-10-29 DIAGNOSIS — F17200 Nicotine dependence, unspecified, uncomplicated: Secondary | ICD-10-CM

## 2016-10-29 MED ORDER — VARENICLINE TARTRATE 0.5 MG X 11 & 1 MG X 42 PO MISC: ORAL | 0 refills | Status: DC

## 2016-10-29 MED ORDER — VARENICLINE TARTRATE 1 MG PO TABS: 1.0000 mg | ORAL_TABLET | Freq: Two times a day (BID) | ORAL | 0 refills | Status: DC

## 2016-10-30 ENCOUNTER — Telehealth: Payer: Self-pay | Admitting: Family Medicine

## 2016-10-30 DIAGNOSIS — M62830 Muscle spasm of back: Secondary | ICD-10-CM

## 2016-10-30 NOTE — Telephone Encounter (Signed)
Patient called the office to request medication refill for naproxen (NAPROSYN) 500 MG tablet. Please call it in to our pharmacy.  Thank you.

## 2016-11-01 MED ORDER — NAPROXEN 500 MG PO TABS: 500.0000 mg | ORAL_TABLET | Freq: Two times a day (BID) | ORAL | 0 refills | Status: DC

## 2016-11-01 MED FILL — NAPROXEN 500 MG TABLET: 500 | 15 days supply | Qty: 30 | Fill #0

## 2016-11-01 NOTE — Telephone Encounter (Signed)
Pt. Called to check on the status on the refill for naproxen. Checked pt. Chart and did not see the medication on pt. Current list.  Please f/u

## 2016-11-01 NOTE — Telephone Encounter (Signed)
Naproxen refill sent into pharmacy Please inform patient

## 2016-11-01 NOTE — Telephone Encounter (Signed)
Pt was called and informed of medication being sent to pharmacy. 

## 2016-11-01 NOTE — Telephone Encounter (Signed)
Will route to PCP 

## 2016-11-13 ENCOUNTER — Ambulatory Visit: Payer: Self-pay | Admitting: Nurse Practitioner

## 2016-11-13 MED FILL — MIRTAZAPINE 15 MG TABLET: 15 | 30 days supply | Qty: 30 | Fill #2

## 2016-11-19 ENCOUNTER — Ambulatory Visit: Payer: Self-pay | Admitting: Nurse Practitioner

## 2016-11-22 ENCOUNTER — Ambulatory Visit: Payer: Self-pay | Admitting: Nurse Practitioner

## 2016-11-30 MED FILL — $CHANTIX STARTING MONTH BOX: 0.5 MG X 11 | 28 days supply | Qty: 53 | Fill #0

## 2016-12-13 ENCOUNTER — Other Ambulatory Visit: Payer: Self-pay | Admitting: Family Medicine

## 2016-12-13 DIAGNOSIS — R630 Anorexia: Secondary | ICD-10-CM

## 2016-12-13 MED FILL — CARTIA XT 180 MG CAPSULE SA: 180 | 30 days supply | Qty: 30 | Fill #2

## 2016-12-13 MED FILL — MIRTAZAPINE 15 MG TABLET: 15 | 30 days supply | Qty: 30 | Fill #0

## 2016-12-24 ENCOUNTER — Telehealth: Payer: Self-pay | Admitting: Family Medicine

## 2016-12-24 DIAGNOSIS — M25561 Pain in right knee: Secondary | ICD-10-CM

## 2016-12-24 NOTE — Telephone Encounter (Signed)
Pt calling for referral to the orthopedics  Pt states she slipped on ice on Feb 5th and fell. R knee was bruised and states it is difficult and very painful to walk ever since    Pt was scheduled the first available with PCP (feb. 19th)  but pt states she is in extreme pain

## 2016-12-26 NOTE — Telephone Encounter (Signed)
Will route to PCP 

## 2016-12-27 NOTE — Telephone Encounter (Signed)
Evaluation is needed prior to referral X-ray ordered Patient advised to elevate, ice and compress knee with ace bandage

## 2016-12-28 NOTE — Telephone Encounter (Signed)
Pt was called and pt states that she will wait until her OV on 2/19 @10 :45 to get her X-rays done. Pt states she would like to wait until she gets the referral first.

## 2016-12-31 ENCOUNTER — Ambulatory Visit: Payer: Self-pay | Attending: Family Medicine | Admitting: Family Medicine

## 2016-12-31 ENCOUNTER — Encounter: Payer: Self-pay | Admitting: Family Medicine

## 2016-12-31 VITALS — BP 123/85 | HR 95 | Temp 98.4°F | Ht 66.0 in | Wt 132.6 lb

## 2016-12-31 DIAGNOSIS — F17210 Nicotine dependence, cigarettes, uncomplicated: Secondary | ICD-10-CM | POA: Insufficient documentation

## 2016-12-31 DIAGNOSIS — W010XXA Fall on same level from slipping, tripping and stumbling without subsequent striking against object, initial encounter: Secondary | ICD-10-CM | POA: Insufficient documentation

## 2016-12-31 DIAGNOSIS — M62830 Muscle spasm of back: Secondary | ICD-10-CM

## 2016-12-31 DIAGNOSIS — M545 Low back pain: Secondary | ICD-10-CM | POA: Insufficient documentation

## 2016-12-31 DIAGNOSIS — G8929 Other chronic pain: Secondary | ICD-10-CM | POA: Insufficient documentation

## 2016-12-31 DIAGNOSIS — Z79899 Other long term (current) drug therapy: Secondary | ICD-10-CM | POA: Insufficient documentation

## 2016-12-31 DIAGNOSIS — M25561 Pain in right knee: Secondary | ICD-10-CM

## 2016-12-31 MED ORDER — NAPROXEN 500 MG PO TABS
500.0000 mg | ORAL_TABLET | Freq: Two times a day (BID) | ORAL | 0 refills | Status: DC
Start: 2016-12-31 — End: 2017-02-12

## 2016-12-31 MED FILL — NAPROXEN 500 MG TABLET: 500 | 15 days supply | Qty: 30 | Fill #0

## 2016-12-31 MED FILL — FLUoxetine HCL 20 MG CAPS: 20 | 30 days supply | Qty: 30 | Fill #1

## 2016-12-31 NOTE — Progress Notes (Signed)
Subjective:  Patient ID: Kara Bailey, female    DOB: 12/02/1955  Age: 61 y.o. MRN: [id number]  CC: Referral   HPI AHMYA DEJOSEPH presents for    1. R medial knee pain: x 2 weeks. Following slip and fall with impact on R medial knee and upper thigh. Has soreness. Swelling has improved. Still limps. She did not complete x-ray. She request ortho referral.   2. Low back pain: chronic. Has sensation of fatigue in her back.   Social History  Substance Use Topics  . Smoking status: Current Every Day Smoker    Packs/day: 0.50    Types: Cigarettes    Start date: 11/12/1968  . Smokeless tobacco: Never Used     Comment: cutting back, wearing patches  . Alcohol use 0.0 oz/week     Comment: beer on weekends (6-pack)    Outpatient Medications Prior to Visit  Medication Sig Dispense Refill  . diltiazem (CARDIZEM CD) 180 MG 24 hr capsule Take 1 capsule (180 mg total) by mouth daily. 30 capsule 2  . FLUoxetine (PROZAC) 20 MG tablet Take 1 tablet (20 mg total) by mouth daily. (Patient taking differently: Take 20 mg by mouth as needed. ) 30 tablet 3  . mirtazapine (REMERON) 15 MG tablet TAKE 1 TABLET BY MOUTH AT BEDTIME. 30 tablet 2  . naproxen (NAPROSYN) 500 MG tablet Take 1 tablet (500 mg total) by mouth 2 (two) times daily with a meal. 30 tablet 0  . pantoprazole (PROTONIX) 40 MG tablet Take 1 tablet (40 mg total) by mouth daily. 30 tablet 5  . varenicline (CHANTIX CONTINUING MONTH PAK) 1 MG tablet Take 1 tablet (1 mg total) by mouth 2 (two) times daily. 56 tablet 0  . varenicline (CHANTIX STARTING MONTH PAK) 0.5 MG X 11 & 1 MG X 42 tablet Tape per packet insert. 53 tablet 0  . diazepam (VALIUM) 5 MG tablet Take 1 tablet (5 mg total) by mouth every 12 (twelve) hours as needed for anxiety. (Patient not taking: Reported on 09/03/2016) 60 tablet 2  . HYDROcodone-acetaminophen (NORCO/VICODIN) 5-325 MG tablet Take 1 tablet by mouth every 6 (six) hours as needed.  0   No facility-administered  medications prior to visit.     ROS Review of Systems  Constitutional: Negative for chills and fever.  Eyes: Negative for visual disturbance.  Respiratory: Negative for shortness of breath.   Cardiovascular: Negative for chest pain.  Gastrointestinal: Negative for abdominal pain and blood in stool.  Musculoskeletal: Positive for arthralgias and back pain.  Skin: Negative for rash.  Allergic/Immunologic: Negative for immunocompromised state.  Hematological: Negative for adenopathy. Does not bruise/bleed easily.  Psychiatric/Behavioral: Negative for dysphoric mood and suicidal ideas.    Objective:  BP 123/85 (BP Location: Left Arm, Patient Position: Sitting, Cuff Size: Small)   Pulse 95   Temp 98.4 F (36.9 C) (Oral)   Ht 5\' 6"  (1.676 m)   Wt 132 lb 9.6 oz (60.1 kg)   SpO2 99%   BMI 21.40 kg/m   BP/Weight 12/31/2016 10/11/2016 [id number]  Systolic BP [id number]  Diastolic BP 85 78 85  Wt. (Lbs) 132.6 124 122  BMI 21.4 20.01 19.69    Physical Exam  Constitutional: She is oriented to person, place, and time. She appears well-developed and well-nourished. No distress.  HENT:  Head: Normocephalic and atraumatic.  Cardiovascular: Normal rate, regular rhythm, normal heart sounds and intact distal pulses.   Pulmonary/Chest: Effort normal and breath sounds normal.  Musculoskeletal: She exhibits no edema.       Right knee: Tenderness found. Medial joint line tenderness noted.       Legs: Neurological: She is alert and oriented to person, place, and time.  Skin: Skin is warm and dry. No rash noted.  Psychiatric: She has a normal mood and affect.     Assessment & Plan:  Keayra was seen today for referral.  Diagnoses and all orders for this visit:  Acute pain of right knee -     Ambulatory referral to Orthopedic Surgery -     naproxen (NAPROSYN) 500 MG tablet; Take 1 tablet (500 mg total) by mouth 2 (two) times daily with a meal.  Back muscle spasm -     Ambulatory  referral to Orthopedic Surgery -     naproxen (NAPROSYN) 500 MG tablet; Take 1 tablet (500 mg total) by mouth 2 (two) times daily with a meal.   There are no diagnoses linked to this encounter.  No orders of the defined types were placed in this encounter.   Follow-up: Return in about 6 weeks (around 02/11/2017) for knee pain .   Boykin Nearing MD

## 2016-12-31 NOTE — Assessment & Plan Note (Signed)
Medial pain in knee and quadricep Patient refused to complete ordered x-ray due to concerns about cost Ortho referral placed Advised ICE and NSAID with food

## 2016-12-31 NOTE — Patient Instructions (Addendum)
Kara Bailey was seen today for referral.  Diagnoses and all orders for this visit:  Acute pain of right knee -     Ambulatory referral to Orthopedic Surgery -     naproxen (NAPROSYN) 500 MG tablet; Take 1 tablet (500 mg total) by mouth 2 (two) times daily with a meal.  Back muscle spasm -     Ambulatory referral to Orthopedic Surgery -     naproxen (NAPROSYN) 500 MG tablet; Take 1 tablet (500 mg total) by mouth 2 (two) times daily with a meal.   You can request medical records at the front desk Take naproxen with food for pain and swelling in R knee and back You will be called with ortho appt   F/u in 6 weeks for back and knee pain   Dr. Adrian Blackwater

## 2017-01-01 ENCOUNTER — Telehealth: Payer: Self-pay | Admitting: Internal Medicine

## 2017-01-01 NOTE — Telephone Encounter (Signed)
Pt called to say that she has tried twice in getting a stool sample and can not get it without urine being in it. She said if there was another way in collecting her stool to let her know. [id number]

## 2017-01-02 NOTE — Telephone Encounter (Signed)
Spoke with the pt and answered her questions. She is going to turn in stool sample next week.

## 2017-01-09 ENCOUNTER — Ambulatory Visit (INDEPENDENT_AMBULATORY_CARE_PROVIDER_SITE_OTHER): Payer: Self-pay

## 2017-01-09 ENCOUNTER — Ambulatory Visit (INDEPENDENT_AMBULATORY_CARE_PROVIDER_SITE_OTHER): Payer: Self-pay | Admitting: Orthopaedic Surgery

## 2017-01-09 DIAGNOSIS — M545 Low back pain, unspecified: Secondary | ICD-10-CM

## 2017-01-09 DIAGNOSIS — M25561 Pain in right knee: Secondary | ICD-10-CM

## 2017-01-09 LAB — CBC WITH DIFFERENTIAL/PLATELET
Basophils Absolute: 0 cells/uL (ref 0–200)
Basophils Relative: 0 %
EOS PCT: 2 %
Eosinophils Absolute: 142 cells/uL (ref 15–500)
HCT: 46.6 % — ABNORMAL HIGH (ref 35.0–45.0)
Hemoglobin: 15.7 g/dL — ABNORMAL HIGH (ref 11.7–15.5)
LYMPHS ABS: 2556 {cells}/uL (ref 850–3900)
Lymphocytes Relative: 36 %
MCH: 32.6 pg (ref 27.0–33.0)
MCHC: 33.7 g/dL (ref 32.0–36.0)
MCV: 96.9 fL (ref 80.0–100.0)
MPV: 10.6 fL (ref 7.5–12.5)
Monocytes Absolute: 639 cells/uL (ref 200–950)
Monocytes Relative: 9 %
NEUTROS ABS: 3763 {cells}/uL (ref 1500–7800)
Neutrophils Relative %: 53 %
Platelets: 264 10*3/uL (ref 140–400)
RBC: 4.81 MIL/uL (ref 3.80–5.10)
RDW: 13.4 % (ref 11.0–15.0)
WBC: 7.1 10*3/uL (ref 3.8–10.8)

## 2017-01-09 MED ORDER — METHOCARBAMOL 500 MG PO TABS: 500.0000 mg | ORAL_TABLET | Freq: Three times a day (TID) | ORAL | 0 refills | Status: DC | PRN

## 2017-01-09 MED ORDER — METHYLPREDNISOLONE 4 MG PO TABS
ORAL_TABLET | ORAL | 0 refills | Status: DC
Start: 2017-01-09 — End: 2017-02-12

## 2017-01-09 MED FILL — ?METHYLPREDNISOLONE 4 MG TA: 4 | 6 days supply | Qty: 21 | Fill #0

## 2017-01-09 MED FILL — METHOCARBAMOL 500 MG TABLET: 500 | 20 days supply | Qty: 60 | Fill #0

## 2017-01-09 NOTE — Progress Notes (Signed)
Office Visit Note   Patient: Kara Bailey           Date of Birth: [birth year]           MRN: [id number] Visit Date: 01/09/2017              Requested by: Boykin Nearing, MD [address] [place], [place] [address] PCP: Minerva Ends, MD   Assessment & Plan: Visit Diagnoses:  1. Acute bilateral low back pain without sciatica   2. Acute pain of right knee     Plan: I gave her reassurance that her right knee should improve with time. I feel that this is just a sprain of her medial collateral ligament. We'll put her in a hinged knee brace that she'll come out of his comfort allows. I'll try six-day's of a steroid taper as well as Robaxin. There is nothing I can recommend for her chronic back pain. She's been to physical therapy. She is seen other physicians for this as well. There was no significant findings on MRI of 2008 and plain films are office today to were not significant including plain films from comparison 2016. I spent a long amount of time reviewing all these independently. She does wish for disability related to her chronic back pain. She can follow-up at [facility] for this which is not interested in any other intervention for her back at all. There is really nothing I have to offer her for her chronic issues with her back at this point.  Follow-Up Instructions: Return if symptoms worsen or fail to improve.   Orders:  Orders Placed This Encounter  Procedures  . XR Knee 1-2 Views Right  . XR Lumbar Spine 2-3 Views   Meds ordered this encounter  Medications  . methylPREDNISolone (MEDROL) 4 MG tablet    Sig: Medrol dose pack. Take as instructed    Dispense:  21 tablet    Refill:  0  . methocarbamol (ROBAXIN) 500 MG tablet    Sig: Take 1 tablet (500 mg total) by mouth every 8 (eight) hours as needed for muscle spasms.    Dispense:  60 tablet    Refill:  0      Procedures: No procedures performed   Clinical Data: No additional  findings.   Subjective: No chief complaint on file. The patient comes in with chief complaint of low back pain and right knee pain. The back pain is been a chronic issue is been going on for years of work standing on her feet. She is seeking disability for her chronic back pain. She says is been to medical treatment as well as physical therapy for the back. She had slipped and fallen recently injuring her back and her right knee. She points the medial side of her knee as source for pain. She said is limiting how she gets around during the day. It is about 8 out of 10 in terms of pain.  HPI  Review of Systems She denies any shortness of breath, fever, chills, headache, nausea, vomiting. She denies any change in bowel or bladder function. She denies any radicular symptoms going down into her legs and feet.  Objective: Vital Signs: There were no vitals taken for this visit.  Physical Exam She is alert and oriented 3 and in no acute distress. She is someone exam table easily. Ortho Exam He is a thin individual. She has full range motion of her right knee with pain only over the  medial collateral ligament but her knee is ligamentously stable. There is no effusion. Her knee tracks well in terms of the patella and alignment overall. Examination her back shows pain to palpation of the midline and paraspinal elements but overall good flexion-extension with no gross findings. She has normal motor and sensory findings in her bilateral lower extremity. Specialty Comments:  No specialty comments available.  Imaging: Xr Knee 1-2 Views Right  Result Date: 01/09/2017 2 views of her right knee show no acute findings. There is no effusion. His no evidence of fracture. There is no malalignment. There is minimal arthritic changes.  Xr Lumbar Spine 2-3 Views  Result Date: 01/09/2017 2 views of her lumbar spine show no acute findings. There is some mild degenerative changes in the back. The disc heights are  well maintained. There is no compression deformity or other severe issues. There is a slight degenerative scoliosis.    PMFS History: Patient Active Problem List   Diagnosis Date Noted  . Acute pain of right knee 12/31/2016  . Diarrhea 09/03/2016  . Poor appetite 08/29/2016  . Peptic stricture of esophagus   . History of colonic polyps   . Hiatal hernia 03/16/2016  . Odynophagia 03/16/2016  . Dysphagia 03/16/2016  . Alcohol abuse 10/26/2015  . HTN (hypertension) 09/26/2015  . Chest pain 10/14/2014  . PAC (premature atrial contraction) 10/11/2014  . PVC (premature ventricular contraction) 10/11/2014  . Back muscle spasm 06/15/2014  . Generalized anxiety disorder 06/15/2014  . Smoking 06/15/2014  . 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 Chronic hepatitis C without hepatic coma ([place]) 05/28/2013  . Jaundice 05/04/2013   Past Medical History:  Diagnosis Date  . Anxiety   . Chest pain 08/2014   unspecified  . Dizziness 08/2014  . Dyspnea 08/2014  . GERD (gastroesophageal reflux disease)   . Hepatitis C   . Hepatitis C virus infection   . Hypertension   . PAC (premature atrial contraction) 10/11/2014  . Palpitations   . PVC (premature ventricular contraction) 10/11/2014  . Shingles    left side  . Tachycardia 08/2014  . Weakness 08/2014    Family History  Problem Relation Age of Onset  . Heart failure Mother   . Heart attack Mother   . Heart disease Mother   . Hypertension Mother   . Hyperlipidemia Mother   . CVA Father   . Alzheimer's disease Father   . Cancer Neg Hx   . Colon cancer Neg Hx     Past Surgical History:  Procedure Laterality Date  . APPENDECTOMY    . COLONOSCOPY WITH PROPOFOL N/A 04/30/2016   Procedure: COLONOSCOPY WITH PROPOFOL;  Surgeon: Daneil Dolin, MD;  Location: [facility];  Service: Endoscopy;  Laterality: N/A;  1230  . ESOPHAGOGASTRODUODENOSCOPY     approximately 2010  . ESOPHAGOGASTRODUODENOSCOPY (EGD) WITH PROPOFOL  N/A 04/30/2016   Procedure: ESOPHAGOGASTRODUODENOSCOPY (EGD) WITH PROPOFOL;  Surgeon: Daneil Dolin, MD;  Location: [facility];  Service: Endoscopy;  Laterality: N/A;  . LEFT HEART CATHETERIZATION WITH CORONARY ANGIOGRAM N/A 10/29/2014   Procedure: LEFT HEART CATHETERIZATION WITH CORONARY ANGIOGRAM;  Surgeon: Burnell Blanks, MD;  Location: [facility] CATH LAB;  Service: Cardiovascular;  Laterality: N/A;  . Venia Minks DILATION N/A 04/30/2016   Procedure: Venia Minks DILATION;  Surgeon: Daneil Dolin, MD;  Location: [facility];  Service: Endoscopy;  Laterality: N/A;  . POLYPECTOMY  04/30/2016   Procedure: POLYPECTOMY;  Surgeon: Daneil Dolin, MD;  Location: [facility];  Service: Endoscopy;;  Sigmoid colon polyp removed via hot snare   Social History   Occupational History  . Not on file.   Social History Main Topics  . Smoking status: Current Every Day Smoker    Packs/day: 0.50    Types: Cigarettes    Start date: 11/12/1968  . Smokeless tobacco: Never Used     Comment: cutting back, wearing patches  . Alcohol use 0.0 oz/week     Comment: beer on weekends (6-pack)  . Drug use: No  . Sexual activity: Not on file

## 2017-01-10 LAB — GASTROINTESTINAL PATHOGEN PANEL PCR
C. DIFFICILE TOX A/B, PCR: NOT DETECTED
CAMPYLOBACTER, PCR: NOT DETECTED
Cryptosporidium, PCR: NOT DETECTED
E COLI 0157, PCR: NOT DETECTED
E coli (ETEC) LT/ST PCR: NOT DETECTED
E coli (STEC) stx1/stx2, PCR: NOT DETECTED
GIARDIA LAMBLIA, PCR: NOT DETECTED
Norovirus, PCR: NOT DETECTED
ROTAVIRUS, PCR: NOT DETECTED
SHIGELLA, PCR: NOT DETECTED
Salmonella, PCR: NOT DETECTED

## 2017-01-10 LAB — CLOSTRIDIUM DIFFICILE BY PCR: CDIFFPCR: NOT DETECTED

## 2017-01-14 MED FILL — MIRTAZAPINE 15 MG TABLET: 15 | 30 days supply | Qty: 30 | Fill #1

## 2017-01-16 ENCOUNTER — Ambulatory Visit: Payer: Self-pay | Attending: Family Medicine

## 2017-02-06 ENCOUNTER — Telehealth: Payer: Self-pay | Admitting: Family Medicine

## 2017-02-06 NOTE — Telephone Encounter (Signed)
Pt calling to request a refill on her Valium. She states that she is completely out. Would like someone to call her and let her know when the script has been written. Thank you.

## 2017-02-06 NOTE — Telephone Encounter (Signed)
Last refill was 10/16 with 2 refills. Can you refill this medication.

## 2017-02-12 ENCOUNTER — Ambulatory Visit: Payer: Self-pay | Attending: Family Medicine | Admitting: Family Medicine

## 2017-02-12 ENCOUNTER — Encounter: Payer: Self-pay | Admitting: Family Medicine

## 2017-02-12 VITALS — BP 123/77 | HR 76 | Temp 99.1°F | Wt 132.0 lb

## 2017-02-12 DIAGNOSIS — W010XXA Fall on same level from slipping, tripping and stumbling without subsequent striking against object, initial encounter: Secondary | ICD-10-CM | POA: Insufficient documentation

## 2017-02-12 DIAGNOSIS — F419 Anxiety disorder, unspecified: Secondary | ICD-10-CM | POA: Insufficient documentation

## 2017-02-12 DIAGNOSIS — M62830 Muscle spasm of back: Secondary | ICD-10-CM | POA: Insufficient documentation

## 2017-02-12 DIAGNOSIS — Z79899 Other long term (current) drug therapy: Secondary | ICD-10-CM | POA: Insufficient documentation

## 2017-02-12 DIAGNOSIS — G8929 Other chronic pain: Secondary | ICD-10-CM | POA: Insufficient documentation

## 2017-02-12 DIAGNOSIS — F411 Generalized anxiety disorder: Secondary | ICD-10-CM | POA: Insufficient documentation

## 2017-02-12 DIAGNOSIS — M25561 Pain in right knee: Secondary | ICD-10-CM | POA: Insufficient documentation

## 2017-02-12 DIAGNOSIS — F17210 Nicotine dependence, cigarettes, uncomplicated: Secondary | ICD-10-CM | POA: Insufficient documentation

## 2017-02-12 DIAGNOSIS — F1010 Alcohol abuse, uncomplicated: Secondary | ICD-10-CM | POA: Insufficient documentation

## 2017-02-12 DIAGNOSIS — M545 Low back pain: Secondary | ICD-10-CM | POA: Insufficient documentation

## 2017-02-12 MED ORDER — BUSPIRONE HCL 5 MG PO TABS: 5.0000 mg | ORAL_TABLET | Freq: Two times a day (BID) | ORAL | 1 refills | Status: DC

## 2017-02-12 MED ORDER — NAPROXEN 500 MG PO TABS: 500.0000 mg | ORAL_TABLET | Freq: Two times a day (BID) | ORAL | 1 refills | Status: DC

## 2017-02-12 MED FILL — ?BUSPIRONE HCL 5 MG TABLET: 5 | 30 days supply | Qty: 60 | Fill #0

## 2017-02-12 MED FILL — NAPROXEN 500 MG TABLET: 500 | 30 days supply | Qty: 60 | Fill #0

## 2017-02-12 NOTE — Telephone Encounter (Signed)
Patient seen today She is still drinking ETOH Transitioned to buspar

## 2017-02-12 NOTE — Progress Notes (Signed)
Subjective:  Patient ID: Kara Bailey, female    DOB: [birth year]  Age: 61 y.o. MRN: [id number]  CC: Back Pain and Knee Pain   HPI Kara Bailey presents for    1. R medial knee pain: x 6 weeks.  Following slip and fall with impact on R medial knee and upper thigh. Has soreness. She went to ortho who suspected sprain of medial collateral ligament. She has been placed in hinged knee brace, prescribed six-day steroid taper and robaxin. She wore the brace for 3 weeks. She reports her pain has improved. She still has some swelling. Her current pain level is 6/10.   2. Low back pain: chronic for the past 5 years. No previous injury. No significant MRI or plan film findings. Current pain level is 8-9/10. She has pain with prolonged standing. She has to stop and sit down on rest after 10-15 minutes. She previously worked in a [employer]. She last worked one year ago. 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   3. Chronic anxiety: she has long history of alcohol abuse. She drinks 1 can of beer twice daily currently. She is smoking. She is taking chantix. She is taking Prozac. 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 She is stressed by unemployment. She is stressed by family demands.   Social History  Substance Use Topics  . Smoking status: Current Every Day Smoker    Packs/day: 0.50    Types: Cigarettes    Start date: 11/12/1968  . Smokeless tobacco: Never Used     Comment: cutting back, wearing patches  . Alcohol use 0.0 oz/week     Comment: beer on weekends (6-pack)    Outpatient Medications Prior to Visit  Medication Sig Dispense Refill  . diazepam (VALIUM) 5 MG tablet Take 1 tablet (5 mg total) by mouth every 12 (twelve) hours as needed for anxiety. (Patient not taking: Reported on 09/03/2016) 60 tablet 2  . diltiazem (CARDIZEM CD) 180 MG 24 hr capsule Take 1 capsule (180 mg total) by mouth daily. 30 capsule 2  . FLUoxetine (PROZAC) 20 MG  tablet Take 1 tablet (20 mg total) by mouth daily. (Patient taking differently: Take 20 mg by mouth as needed. ) 30 tablet 3  . methocarbamol (ROBAXIN) 500 MG tablet Take 1 tablet (500 mg total) by mouth every 8 (eight) hours as needed for muscle spasms. 60 tablet 0  . methylPREDNISolone (MEDROL) 4 MG tablet Medrol dose pack. Take as instructed 21 tablet 0  . mirtazapine (REMERON) 15 MG tablet TAKE 1 TABLET BY MOUTH AT BEDTIME. 30 tablet 2  . naproxen (NAPROSYN) 500 MG tablet Take 1 tablet (500 mg total) by mouth 2 (two) times daily with a meal. 30 tablet 0  . pantoprazole (PROTONIX) 40 MG tablet Take 1 tablet (40 mg total) by mouth daily. 30 tablet 5  . varenicline (CHANTIX CONTINUING MONTH PAK) 1 MG tablet Take 1 tablet (1 mg total) by mouth 2 (two) times daily. 56 tablet 0  . varenicline (CHANTIX STARTING MONTH PAK) 0.5 MG X 11 & 1 MG X 42 tablet Tape per packet insert. 53 tablet 0   No facility-administered medications prior to visit.     ROS Review of Systems  Constitutional: Negative for chills and fever.  Eyes: Negative for visual disturbance.  Respiratory: Negative for shortness of breath.   Cardiovascular: Negative for chest pain.  Gastrointestinal: Negative for abdominal pain and blood in  stool.  Musculoskeletal: Positive for arthralgias and back pain.  Skin: Negative for rash.  Allergic/Immunologic: Negative for immunocompromised state.  Hematological: Negative for adenopathy. Does not bruise/bleed easily.  Psychiatric/Behavioral: Negative for dysphoric mood and suicidal ideas. The patient is nervous/anxious.     Objective:  BP 123/77   Pulse 76   Temp 99.1 F (37.3 C) (Oral)   Wt 132 lb (59.9 kg)   SpO2 97%   BMI 21.31 kg/m   BP/Weight 02/12/2017 12/31/2016 38/32/9191  Systolic BP 660 600 459  Diastolic BP 77 85 78  Wt. (Lbs) 132 132.6 124  BMI 21.31 21.4 20.01    Physical Exam  Constitutional: She is oriented to person, place, and time. She appears well-developed  and well-nourished. No distress.  HENT:  Head: Normocephalic and atraumatic.  Cardiovascular: Normal rate, regular rhythm, normal heart sounds and intact distal pulses.   Pulmonary/Chest: Effort normal and breath sounds normal.  Musculoskeletal: She exhibits no edema.       Right knee: Tenderness found. Medial joint line tenderness noted.       Legs: Neurological: She is alert and oriented to person, place, and time.  Skin: Skin is warm and dry. No rash noted.  Psychiatric: She has a normal mood and affect.     Assessment & Plan:  Kara Bailey was seen today for back pain and knee pain.  Diagnoses and all orders for this visit:  Generalized anxiety disorder -     busPIRone (BUSPAR) 5 MG tablet; Take 1 tablet (5 mg total) by mouth 2 (two) times daily.  Acute pain of right knee -     naproxen (NAPROSYN) 500 MG tablet; Take 1 tablet (500 mg total) by mouth 2 (two) times daily with a meal.  Back muscle spasm -     naproxen (NAPROSYN) 500 MG tablet; Take 1 tablet (500 mg total) by mouth 2 (two) times daily with a meal.   There are no diagnoses linked to this encounter.  No orders of the defined types were placed in this encounter.   Follow-up: Return in about 4 weeks (around 03/12/2017) for anxiety.   Boykin Nearing MD

## 2017-02-12 NOTE — Assessment & Plan Note (Signed)
Generalized anxiety with alcohol abuse history Plan: Continue Prozac Add Buspar

## 2017-02-12 NOTE — Patient Instructions (Addendum)
Kara Bailey was seen today for back pain and knee pain.  Diagnoses and all orders for this visit:  Generalized anxiety disorder -     busPIRone (BUSPAR) 5 MG tablet; Take 1 tablet (5 mg total) by mouth 2 (two) times daily.  Acute pain of right knee -     naproxen (NAPROSYN) 500 MG tablet; Take 1 tablet (500 mg total) by mouth 2 (two) times daily with a meal.  Back muscle spasm -     naproxen (NAPROSYN) 500 MG tablet; Take 1 tablet (500 mg total) by mouth 2 (two) times daily with a meal.   Please add robaxin at night to regimen for chronic back pain in addition to naproxen buspar for anxiety  f/u in 4 weeks for anxiety  Dr. Adrian Blackwater

## 2017-02-12 NOTE — Assessment & Plan Note (Signed)
Chronic back pain Restart naproxen

## 2017-02-12 NOTE — Assessment & Plan Note (Signed)
Improved Plan: Continue NSAID

## 2017-02-14 MED FILL — MIRTAZAPINE 15 MG TABLET: 15 | 30 days supply | Qty: 30 | Fill #2

## 2017-03-12 ENCOUNTER — Encounter: Payer: Self-pay | Admitting: Family Medicine

## 2017-03-12 ENCOUNTER — Ambulatory Visit: Payer: Self-pay | Attending: Family Medicine | Admitting: Family Medicine

## 2017-03-12 VITALS — BP 130/78 | HR 78 | Temp 98.2°F | Ht 66.0 in | Wt 131.6 lb

## 2017-03-12 DIAGNOSIS — M62830 Muscle spasm of back: Secondary | ICD-10-CM

## 2017-03-12 DIAGNOSIS — G8929 Other chronic pain: Secondary | ICD-10-CM | POA: Insufficient documentation

## 2017-03-12 DIAGNOSIS — F411 Generalized anxiety disorder: Secondary | ICD-10-CM

## 2017-03-12 DIAGNOSIS — Z23 Encounter for immunization: Secondary | ICD-10-CM

## 2017-03-12 DIAGNOSIS — I10 Essential (primary) hypertension: Secondary | ICD-10-CM

## 2017-03-12 DIAGNOSIS — F1010 Alcohol abuse, uncomplicated: Secondary | ICD-10-CM | POA: Insufficient documentation

## 2017-03-12 DIAGNOSIS — R630 Anorexia: Secondary | ICD-10-CM

## 2017-03-12 DIAGNOSIS — M5414 Radiculopathy, thoracic region: Secondary | ICD-10-CM | POA: Insufficient documentation

## 2017-03-12 DIAGNOSIS — F17210 Nicotine dependence, cigarettes, uncomplicated: Secondary | ICD-10-CM | POA: Insufficient documentation

## 2017-03-12 MED ORDER — DILTIAZEM HCL ER COATED BEADS 180 MG PO CP24: 180.0000 mg | ORAL_CAPSULE | Freq: Every day | ORAL | 5 refills | Status: DC

## 2017-03-12 MED ORDER — MIRTAZAPINE 15 MG PO TABS
15.0000 mg | ORAL_TABLET | Freq: Every day | ORAL | 5 refills | Status: DC
Start: 2017-03-12 — End: 2017-08-14

## 2017-03-12 MED ORDER — METHOCARBAMOL 500 MG PO TABS: 500.0000 mg | ORAL_TABLET | Freq: Two times a day (BID) | ORAL | 2 refills | Status: DC | PRN

## 2017-03-12 MED ORDER — BUSPIRONE HCL 10 MG PO TABS: 10.0000 mg | ORAL_TABLET | Freq: Two times a day (BID) | ORAL | 5 refills | Status: DC

## 2017-03-12 MED ORDER — FLUOXETINE HCL 20 MG PO TABS: 20.0000 mg | ORAL_TABLET | Freq: Every day | ORAL | 5 refills | Status: DC

## 2017-03-12 MED FILL — DILTIAZEM 24HR ER 180 MG CA: 180 | 30 days supply | Qty: 30 | Fill #0

## 2017-03-12 MED FILL — MIRTAZAPINE 15 MG TABLET: 15 | 30 days supply | Qty: 30 | Fill #0

## 2017-03-12 MED FILL — busPIRone HCL 10 MG TABS: 10 | 30 days supply | Qty: 60 | Fill #0

## 2017-03-12 MED FILL — FLUoxetine HCL 20 MG CAPS: 20 | 30 days supply | Qty: 30 | Fill #0

## 2017-03-12 MED FILL — METHOCARBAMOL 500 MG TABLET: 500 | 30 days supply | Qty: 60 | Fill #0

## 2017-03-12 NOTE — Assessment & Plan Note (Signed)
A: elevated BP today [id number] P: Continue diltiazem 180 mg daily

## 2017-03-12 NOTE — Progress Notes (Signed)
Pt states medication is not working.

## 2017-03-12 NOTE — Assessment & Plan Note (Signed)
A: persistent P: Continue prozac 20 mg daily Increase buspar to 10 mg BID

## 2017-03-12 NOTE — Assessment & Plan Note (Signed)
Chronic pain with spasam Plan: Naproxen prn robaxin BID prn, advised evening

## 2017-03-12 NOTE — Patient Instructions (Addendum)
Kara Bailey was seen today for anxiety.  Diagnoses and all orders for this visit:  Essential hypertension -     diltiazem (CARDIZEM CD) 180 MG 24 hr capsule; Take 1 capsule (180 mg total) by mouth daily.  Generalized anxiety disorder -     busPIRone (BUSPAR) 10 MG tablet; Take 1 tablet (10 mg total) by mouth 2 (two) times daily.  Poor appetite -     mirtazapine (REMERON) 15 MG tablet; Take 1 tablet (15 mg total) by mouth at bedtime.  Anxiety state -     FLUoxetine (PROZAC) 20 MG tablet; Take 1 tablet (20 mg total) by mouth daily.  Back muscle spasm -     methocarbamol (ROBAXIN) 500 MG tablet; Take 1 tablet (500 mg total) by mouth 2 (two) times daily as needed for muscle spasms.   Dr. Adrian Blackwater

## 2017-03-12 NOTE — Progress Notes (Signed)
Subjective:  Patient ID: Kara Bailey, female    DOB: April 16, 1956  Age: 61 y.o. MRN: [id number]  CC: Anxiety   HPI Kara Bailey presents for    1. HTN:  Last took diltiazem around 10 AM yesterday. Endorses a being little lightheadedness. No headache, chest pain or shortness.   2. Chronic anxiety: she has long history of alcohol abuse. She drinks 1 can of beer twice daily currently. She is smoking. She is taking chantix. She is taking Prozac.  She is taking Remeron 15 mg nighty. She is taking buspar 5 mg BID, denies dizziness. 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 She is stressed by unemployment. She is stressed by family demands.   3. Chronic back pain: midline pain radiates down thighs to calves. Worse with housekeeping especially sweeping. Relieve with rest. No weakness in legs. No incontinence. She takes naproxen sparingly which helps. She had robaxin about 2 months ago and reports that also helped. No recent trauma.   Social History  Substance Use Topics  . Smoking status: Current Every Day Smoker    Packs/day: 0.50    Types: Cigarettes    Start date: 11/12/1968  . Smokeless tobacco: Never Used     Comment: cutting back, wearing patches  . Alcohol use 0.0 oz/week     Comment: beer on weekends (6-pack)    Outpatient Medications Prior to Visit  Medication Sig Dispense Refill  . busPIRone (BUSPAR) 5 MG tablet Take 1 tablet (5 mg total) by mouth 2 (two) times daily. 60 tablet 1  . diltiazem (CARDIZEM CD) 180 MG 24 hr capsule Take 1 capsule (180 mg total) by mouth daily. 30 capsule 2  . FLUoxetine (PROZAC) 20 MG tablet Take 1 tablet (20 mg total) by mouth daily. (Patient taking differently: Take 20 mg by mouth as needed. ) 30 tablet 3  . methocarbamol (ROBAXIN) 500 MG tablet Take 1 tablet (500 mg total) by mouth every 8 (eight) hours as needed for muscle spasms. 60 tablet 0  . mirtazapine (REMERON) 15 MG tablet TAKE 1 TABLET BY MOUTH AT  BEDTIME. 30 tablet 2  . naproxen (NAPROSYN) 500 MG tablet Take 1 tablet (500 mg total) by mouth 2 (two) times daily with a meal. 60 tablet 1  . pantoprazole (PROTONIX) 40 MG tablet Take 1 tablet (40 mg total) by mouth daily. 30 tablet 5  . varenicline (CHANTIX CONTINUING MONTH PAK) 1 MG tablet Take 1 tablet (1 mg total) by mouth 2 (two) times daily. 56 tablet 0  . varenicline (CHANTIX STARTING MONTH PAK) 0.5 MG X 11 & 1 MG X 42 tablet Tape per packet insert. 53 tablet 0   No facility-administered medications prior to visit.     ROS Review of Systems  Constitutional: Negative for chills and fever.  Eyes: Negative for visual disturbance.  Respiratory: Negative for shortness of breath.   Cardiovascular: Negative for chest pain.  Gastrointestinal: Negative for abdominal pain and blood in stool.  Musculoskeletal: Positive for arthralgias and back pain.  Skin: Negative for rash.  Allergic/Immunologic: Negative for immunocompromised state.  Hematological: Negative for adenopathy. Does not bruise/bleed easily.  Psychiatric/Behavioral: Negative for dysphoric mood and suicidal ideas. The patient is nervous/anxious.     Objective:  BP 130/78   Pulse 78   Temp 98.2 F (36.8 C) (Oral)   Ht 5\' 6"  (1.676 m)   Wt 131 lb 9.6 oz (59.7 kg)   SpO2 100%  BMI 21.24 kg/m   BP/Weight 03/12/2017 02/12/2017 7/93/9688  Systolic BP 648 472 072  Diastolic BP 78 77 85  Wt. (Lbs) 131.6 132 132.6  BMI 21.24 21.31 21.4    Physical Exam  Constitutional: She is oriented to person, place, and time. She appears well-developed and well-nourished. No distress.  HENT:  Head: Normocephalic and atraumatic.  Cardiovascular: Normal rate, regular rhythm, normal heart sounds and intact distal pulses.   Pulses:      Dorsalis pedis pulses are 2+ on the right side.       Posterior tibial pulses are 2+ on the right side.  Pulmonary/Chest: Effort normal and breath sounds normal.  Musculoskeletal: She exhibits no edema.    Neurological: She is alert and oriented to person, place, and time.  Skin: Skin is warm and dry. No rash noted.  Psychiatric: She has a normal mood and affect.   Depression screen [facility] 2/9 03/12/2017 02/12/2017 12/31/2016  Decreased Interest 0 1 1  Down, Depressed, Hopeless 1 1 0  PHQ - 2 Score 1 2 1   Altered sleeping 1 1 0  Tired, decreased energy 1 0 1  Change in appetite 1 0 0  Feeling bad or failure about yourself  0 0 0  Trouble concentrating 0 0 0  Moving slowly or fidgety/restless 0 0 0  Suicidal thoughts 0 0 0  PHQ-9 Score 4 3 2   Difficult doing work/chores - - -  Some recent data might be hidden   GAD 7 : Generalized Anxiety Score 03/12/2017 02/12/2017 12/31/2016 10/11/2016  Nervous, Anxious, on Edge 2 3 1 1   Control/stop worrying 2 2 1 1   Worry too much - different things 2 2 1 1   Trouble relaxing 2 2 0 1  Restless 1 1 0 1  Easily annoyed or irritable 2 2 1 1   Afraid - awful might happen 0 0 1 1  Total GAD 7 Score 11 12 5 7     Assessment & Plan:  Kara Bailey was seen today for anxiety.  Diagnoses and all orders for this visit:  Essential hypertension -     diltiazem (CARDIZEM CD) 180 MG 24 hr capsule; Take 1 capsule (180 mg total) by mouth daily.  Generalized anxiety disorder -     busPIRone (BUSPAR) 10 MG tablet; Take 1 tablet (10 mg total) by mouth 2 (two) times daily.  Poor appetite -     mirtazapine (REMERON) 15 MG tablet; Take 1 tablet (15 mg total) by mouth at bedtime.  Anxiety state -     FLUoxetine (PROZAC) 20 MG tablet; Take 1 tablet (20 mg total) by mouth daily.  Back muscle spasm -     methocarbamol (ROBAXIN) 500 MG tablet; Take 1 tablet (500 mg total) by mouth 2 (two) times daily as needed for muscle spasms.   There are no diagnoses linked to this encounter.  No orders of the defined types were placed in this encounter.   Follow-up: Return in about 6 weeks (around 04/23/2017) for anxiety .   Boykin Nearing MD

## 2017-03-27 ENCOUNTER — Encounter: Payer: Self-pay | Admitting: Family Medicine

## 2017-04-18 ENCOUNTER — Ambulatory Visit: Payer: Self-pay | Attending: Family Medicine

## 2017-04-18 MED FILL — MIRTAZAPINE 15 MG TABLET: 15 | 30 days supply | Qty: 30 | Fill #1

## 2017-04-24 ENCOUNTER — Ambulatory Visit: Payer: Self-pay

## 2017-04-25 ENCOUNTER — Ambulatory Visit: Payer: Self-pay | Attending: Family Medicine

## 2017-04-26 ENCOUNTER — Ambulatory Visit: Payer: Self-pay | Admitting: Family Medicine

## 2017-04-26 NOTE — Progress Notes (Deleted)
Subjective:  Patient ID: Kara Bailey, female    DOB: 03/31/1956  Age: 61 y.o. MRN: [id number]  CC: No chief complaint on file.   HPI Kara Bailey presents for    1. HTN:  Last took diltiazem around 10 AM yesterday. Endorses a being little lightheadedness. No headache, chest pain or shortness.   2. Chronic anxiety: she has long history of alcohol abuse. She drinks 1 can of beer twice daily currently. She is smoking. She is taking chantix. She is taking Prozac.  She is taking Remeron 15 mg nighty. She is taking buspar 5 mg BID, denies dizziness. 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 She is stressed by unemployment. She is stressed by family demands.   As the last visit, her buspar dose was increased from 5 to 10 mg twice daily.  Today she reports,   3. Chronic back pain: midline pain radiates down thighs to calves. Worse with housekeeping especially sweeping. Relieve with rest. No weakness in legs. No incontinence. She takes naproxen sparingly which helps. She had robaxin about 2 months ago and reports that also helped. No recent trauma.   Social History  Substance Use Topics  . Smoking status: Current Every Day Smoker    Packs/day: 0.50    Types: Cigarettes    Start date: 11/12/1968  . Smokeless tobacco: Never Used     Comment: cutting back, wearing patches  . Alcohol use 0.0 oz/week     Comment: beer on weekends (6-pack)    Outpatient Medications Prior to Visit  Medication Sig Dispense Refill  . busPIRone (BUSPAR) 10 MG tablet Take 1 tablet (10 mg total) by mouth 2 (two) times daily. 60 tablet 5  . diltiazem (CARDIZEM CD) 180 MG 24 hr capsule Take 1 capsule (180 mg total) by mouth daily. 30 capsule 5  . FLUoxetine (PROZAC) 20 MG tablet Take 1 tablet (20 mg total) by mouth daily. 30 tablet 5  . methocarbamol (ROBAXIN) 500 MG tablet Take 1 tablet (500 mg total) by mouth 2 (two) times daily as needed for muscle spasms. 60 tablet 2    . mirtazapine (REMERON) 15 MG tablet Take 1 tablet (15 mg total) by mouth at bedtime. 30 tablet 5  . naproxen (NAPROSYN) 500 MG tablet Take 1 tablet (500 mg total) by mouth 2 (two) times daily with a meal. 60 tablet 1  . pantoprazole (PROTONIX) 40 MG tablet Take 1 tablet (40 mg total) by mouth daily. 30 tablet 5  . varenicline (CHANTIX CONTINUING MONTH PAK) 1 MG tablet Take 1 tablet (1 mg total) by mouth 2 (two) times daily. 56 tablet 0   No facility-administered medications prior to visit.     ROS Review of Systems  Constitutional: Negative for chills and fever.  Eyes: Negative for visual disturbance.  Respiratory: Negative for shortness of breath.   Cardiovascular: Negative for chest pain.  Gastrointestinal: Negative for abdominal pain and blood in stool.  Musculoskeletal: Positive for arthralgias and back pain.  Skin: Negative for rash.  Allergic/Immunologic: Negative for immunocompromised state.  Hematological: Negative for adenopathy. Does not bruise/bleed easily.  Psychiatric/Behavioral: Negative for dysphoric mood and suicidal ideas. The patient is nervous/anxious.     Objective:  There were no vitals taken for this visit.  BP/Weight 03/12/2017 02/12/2017 03/20/3266  Systolic BP 124 580 998  Diastolic BP 78 77 85  Wt. (Lbs) 131.6 132 132.6  BMI 21.24 21.31 21.4    Physical  Exam  Constitutional: She is oriented to person, place, and time. She appears well-developed and well-nourished. No distress.  HENT:  Head: Normocephalic and atraumatic.  Cardiovascular: Normal rate, regular rhythm, normal heart sounds and intact distal pulses.   Pulses:      Dorsalis pedis pulses are 2+ on the right side.       Posterior tibial pulses are 2+ on the right side.  Pulmonary/Chest: Effort normal and breath sounds normal.  Musculoskeletal: She exhibits no edema.  Neurological: She is alert and oriented to person, place, and time.  Skin: Skin is warm and dry. No rash noted.  Psychiatric:  She has a normal mood and affect.   Depression screen [facility] 2/9 03/12/2017 02/12/2017 12/31/2016  Decreased Interest 0 1 1  Down, Depressed, Hopeless 1 1 0  PHQ - 2 Score 1 2 1   Altered sleeping 1 1 0  Tired, decreased energy 1 0 1  Change in appetite 1 0 0  Feeling bad or failure about yourself  0 0 0  Trouble concentrating 0 0 0  Moving slowly or fidgety/restless 0 0 0  Suicidal thoughts 0 0 0  PHQ-9 Score 4 3 2   Difficult doing work/chores - - -  Some recent data might be hidden   GAD 7 : Generalized Anxiety Score 03/12/2017 02/12/2017 12/31/2016 10/11/2016  Nervous, Anxious, on Edge 2 3 1 1   Control/stop worrying 2 2 1 1   Worry too much - different things 2 2 1 1   Trouble relaxing 2 2 0 1  Restless 1 1 0 1  Easily annoyed or irritable 2 2 1 1   Afraid - awful might happen 0 0 1 1  Total GAD 7 Score 11 12 5 7     Assessment & Plan:  There are no diagnoses linked to this encounter. There are no diagnoses linked to this encounter.  No orders of the defined types were placed in this encounter.   Follow-up: No Follow-up on file.   Boykin Nearing MD

## 2017-05-17 MED FILL — ?MIRTAZAPINE 15 MG TABLET: 15 | 30 days supply | Qty: 30 | Fill #2

## 2017-06-04 ENCOUNTER — Ambulatory Visit: Payer: Self-pay | Admitting: Family Medicine

## 2017-06-04 ENCOUNTER — Telehealth: Payer: Self-pay | Admitting: Family Medicine

## 2017-06-04 DIAGNOSIS — K0510 Chronic gingivitis, plaque induced: Secondary | ICD-10-CM

## 2017-06-04 NOTE — Telephone Encounter (Signed)
Pt calling to request a dental referral, states gums are swollen and bleeding, has OC. Please f/u. Thank you.

## 2017-06-04 NOTE — Telephone Encounter (Signed)
Dental referral placed Please inform patient

## 2017-06-05 NOTE — Telephone Encounter (Signed)
Pt was called and informed of lab results. 

## 2017-06-17 ENCOUNTER — Other Ambulatory Visit: Payer: Self-pay | Admitting: Family Medicine

## 2017-06-17 DIAGNOSIS — K219 Gastro-esophageal reflux disease without esophagitis: Secondary | ICD-10-CM

## 2017-06-17 MED FILL — ?MIRTAZAPINE 15 MG TABLET: 15 | 30 days supply | Qty: 30 | Fill #3

## 2017-06-18 MED FILL — PANTOPRAZOLE SOD DR 40 MG T: 40 | 30 days supply | Qty: 30 | Fill #0

## 2017-07-19 MED FILL — ?MIRTAZAPINE 15 MG TABLET: 15 | 30 days supply | Qty: 30 | Fill #4

## 2017-08-05 ENCOUNTER — Ambulatory Visit: Payer: Self-pay | Admitting: Family Medicine

## 2017-08-14 ENCOUNTER — Encounter: Payer: Self-pay | Admitting: Internal Medicine

## 2017-08-14 ENCOUNTER — Ambulatory Visit: Payer: Self-pay | Attending: Family Medicine | Admitting: Internal Medicine

## 2017-08-14 VITALS — BP 138/76 | HR 89 | Temp 98.8°F | Resp 18 | Ht 66.0 in | Wt 135.0 lb

## 2017-08-14 DIAGNOSIS — Z8619 Personal history of other infectious and parasitic diseases: Secondary | ICD-10-CM | POA: Insufficient documentation

## 2017-08-14 DIAGNOSIS — R05 Cough: Secondary | ICD-10-CM | POA: Insufficient documentation

## 2017-08-14 DIAGNOSIS — Z1239 Encounter for other screening for malignant neoplasm of breast: Secondary | ICD-10-CM

## 2017-08-14 DIAGNOSIS — F419 Anxiety disorder, unspecified: Secondary | ICD-10-CM | POA: Insufficient documentation

## 2017-08-14 DIAGNOSIS — F17200 Nicotine dependence, unspecified, uncomplicated: Secondary | ICD-10-CM | POA: Insufficient documentation

## 2017-08-14 DIAGNOSIS — K219 Gastro-esophageal reflux disease without esophagitis: Secondary | ICD-10-CM | POA: Insufficient documentation

## 2017-08-14 DIAGNOSIS — G4700 Insomnia, unspecified: Secondary | ICD-10-CM | POA: Insufficient documentation

## 2017-08-14 DIAGNOSIS — Z79899 Other long term (current) drug therapy: Secondary | ICD-10-CM | POA: Insufficient documentation

## 2017-08-14 DIAGNOSIS — F411 Generalized anxiety disorder: Secondary | ICD-10-CM | POA: Insufficient documentation

## 2017-08-14 DIAGNOSIS — Z1231 Encounter for screening mammogram for malignant neoplasm of breast: Secondary | ICD-10-CM

## 2017-08-14 DIAGNOSIS — I10 Essential (primary) hypertension: Secondary | ICD-10-CM | POA: Insufficient documentation

## 2017-08-14 DIAGNOSIS — R053 Chronic cough: Secondary | ICD-10-CM | POA: Insufficient documentation

## 2017-08-14 MED ORDER — BUSPIRONE HCL 10 MG PO TABS: 10.0000 mg | ORAL_TABLET | Freq: Two times a day (BID) | ORAL | 5 refills | Status: DC

## 2017-08-14 MED ORDER — FLUOXETINE HCL 20 MG PO TABS: 20.0000 mg | ORAL_TABLET | Freq: Every day | ORAL | 3 refills | Status: DC

## 2017-08-14 MED ORDER — PANTOPRAZOLE SODIUM 40 MG PO TBEC: 40.0000 mg | DELAYED_RELEASE_TABLET | Freq: Every day | ORAL | 2 refills | Status: DC

## 2017-08-14 MED ORDER — DILTIAZEM HCL ER COATED BEADS 180 MG PO CP24: 180.0000 mg | ORAL_CAPSULE | Freq: Every day | ORAL | 3 refills | Status: DC

## 2017-08-14 MED ORDER — DOXYCYCLINE HYCLATE 100 MG PO TABS: 100.0000 mg | ORAL_TABLET | Freq: Two times a day (BID) | ORAL | 0 refills | Status: AC

## 2017-08-14 MED ORDER — MIRTAZAPINE 15 MG PO TABS: 15.0000 mg | ORAL_TABLET | Freq: Every day | ORAL | 3 refills | Status: DC

## 2017-08-14 MED ORDER — GUAIFENESIN-DM 100-10 MG/5ML PO SYRP: 5.0000 mL | ORAL_SOLUTION | ORAL | 0 refills | Status: DC | PRN

## 2017-08-14 MED FILL — busPIRone HCL 10 MG TABS: 10 | 30 days supply | Qty: 60 | Fill #0

## 2017-08-14 MED FILL — ?MIRTAZAPINE 15 MG TABLET: 15 | 30 days supply | Qty: 30 | Fill #0

## 2017-08-14 MED FILL — ?PANTOPRAZOLE SOD DR 40MG: 40 MG | 30 days supply | Qty: 30 | Fill #0

## 2017-08-14 MED FILL — ROBAFEN-DM SYRUP: 100-10 | 16 days supply | Qty: 480 | Fill #0

## 2017-08-14 MED FILL — DOXYCYCLINE 100 MG TABLET: 100 | 10 days supply | Qty: 20 | Fill #0

## 2017-08-14 MED FILL — ?FLUOXETINE HCL 20MG TABLET: 20 | 30 days supply | Qty: 30 | Fill #0

## 2017-08-14 NOTE — Patient Instructions (Signed)
Hypertension Hypertension, commonly called high blood pressure, is when the force of blood pumping through the arteries is too strong. The arteries are the blood vessels that carry blood from the heart throughout the body. Hypertension forces the heart to work harder to pump blood and may cause arteries to become narrow or stiff. Having untreated or uncontrolled hypertension can cause heart attacks, strokes, kidney disease, and other problems. A blood pressure reading consists of a higher number over a lower number. Ideally, your blood pressure should be below 120/80. The first ("top") number is called the systolic pressure. It is a measure of the pressure in your arteries as your heart beats. The second ("bottom") number is called the diastolic pressure. It is a measure of the pressure in your arteries as the heart relaxes. What are the causes? The cause of this condition is not known. What increases the risk? Some risk factors for high blood pressure are under your control. Others are not. Factors you can change  Smoking.  Having type 2 diabetes mellitus, high cholesterol, or both.  Not getting enough exercise or physical activity.  Being overweight.  Having too much fat, sugar, calories, or salt (sodium) in your diet.  Drinking too much alcohol. Factors that are difficult or impossible to change  Having chronic kidney disease.  Having a family history of high blood pressure.  Age. Risk increases with age.  Race. You may be at higher risk if you are African-American.  Gender. Men are at higher risk than women before age 45. After age 65, women are at higher risk than men.  Having obstructive sleep apnea.  Stress. What are the signs or symptoms? Extremely high blood pressure (hypertensive crisis) may cause:  Headache.  Anxiety.  Shortness of breath.  Nosebleed.  Nausea and vomiting.  Severe chest pain.  Jerky movements you cannot control (seizures).  How is this  diagnosed? This condition is diagnosed by measuring your blood pressure while you are seated, with your arm resting on a surface. The cuff of the blood pressure monitor will be placed directly against the skin of your upper arm at the level of your heart. It should be measured at least twice using the same arm. Certain conditions can cause a difference in blood pressure between your right and left arms. Certain factors can cause blood pressure readings to be lower or higher than normal (elevated) for a short period of time:  When your blood pressure is higher when you are in a health care provider's office than when you are at home, this is called white coat hypertension. Most people with this condition do not need medicines.  When your blood pressure is higher at home than when you are in a health care provider's office, this is called masked hypertension. Most people with this condition may need medicines to control blood pressure.  If you have a high blood pressure reading during one visit or you have normal blood pressure with other risk factors:  You may be asked to return on a different day to have your blood pressure checked again.  You may be asked to monitor your blood pressure at home for 1 week or longer.  If you are diagnosed with hypertension, you may have other blood or imaging tests to help your health care provider understand your overall risk for other conditions. How is this treated? This condition is treated by making healthy lifestyle changes, such as eating healthy foods, exercising more, and reducing your alcohol intake. Your   health care provider may prescribe medicine if lifestyle changes are not enough to get your blood pressure under control, and if:  Your systolic blood pressure is above 130.  Your diastolic blood pressure is above 80.  Your personal target blood pressure may vary depending on your medical conditions, your age, and other factors. Follow these  instructions at home: Eating and drinking  Eat a diet that is high in fiber and potassium, and low in sodium, added sugar, and fat. An example eating plan is called the DASH (Dietary Approaches to Stop Hypertension) diet. To eat this way: ? Eat plenty of fresh fruits and vegetables. Try to fill half of your plate at each meal with fruits and vegetables. ? Eat whole grains, such as whole wheat pasta, brown rice, or whole grain bread. Fill about one quarter of your plate with whole grains. ? Eat or drink low-fat dairy products, such as skim milk or low-fat yogurt. ? Avoid fatty cuts of meat, processed or cured meats, and poultry with skin. Fill about one quarter of your plate with lean proteins, such as fish, chicken without skin, beans, eggs, and tofu. ? Avoid premade and processed foods. These tend to be higher in sodium, added sugar, and fat.  Reduce your daily sodium intake. Most people with hypertension should eat less than 1,500 mg of sodium a day.  Limit alcohol intake to no more than 1 drink a day for nonpregnant women and 2 drinks a day for men. One drink equals 12 oz of beer, 5 oz of wine, or 1 oz of hard liquor. Lifestyle  Work with your health care provider to maintain a healthy body weight or to lose weight. Ask what an ideal weight is for you.  Get at least 30 minutes of exercise that causes your heart to beat faster (aerobic exercise) most days of the week. Activities may include walking, swimming, or biking.  Include exercise to strengthen your muscles (resistance exercise), such as pilates or lifting weights, as part of your weekly exercise routine. Try to do these types of exercises for 30 minutes at least 3 days a week.  Do not use any products that contain nicotine or tobacco, such as cigarettes and e-cigarettes. If you need help quitting, ask your health care provider.  Monitor your blood pressure at home as told by your health care provider.  Keep all follow-up visits as  told by your health care provider. This is important. Medicines  Take over-the-counter and prescription medicines only as told by your health care provider. Follow directions carefully. Blood pressure medicines must be taken as prescribed.  Do not skip doses of blood pressure medicine. Doing this puts you at risk for problems and can make the medicine less effective.  Ask your health care provider about side effects or reactions to medicines that you should watch for. Contact a health care provider if:  You think you are having a reaction to a medicine you are taking.  You have headaches that keep coming back (recurring).  You feel dizzy.  You have swelling in your ankles.  You have trouble with your vision. Get help right away if:  You develop a severe headache or confusion.  You have unusual weakness or numbness.  You feel faint.  You have severe pain in your chest or abdomen.  You vomit repeatedly.  You have trouble breathing. Summary  Hypertension is when the force of blood pumping through your arteries is too strong. If this condition is not   controlled, it may put you at risk for serious complications.  Your personal target blood pressure may vary depending on your medical conditions, your age, and other factors. For most people, a normal blood pressure is less than 120/80.  Hypertension is treated with lifestyle changes, medicines, or a combination of both. Lifestyle changes include weight loss, eating a healthy, low-sodium diet, exercising more, and limiting alcohol. This information is not intended to replace advice given to you by your health care provider. Make sure you discuss any questions you have with your health care provider. Document Released: 10/29/2005 Document Revised: 09/26/2016 Document Reviewed: 09/26/2016 Elsevier Interactive Patient Education  2018 [employer] DASH Eating Plan DASH stands for "Dietary Approaches to Stop Hypertension." The DASH  eating plan is a healthy eating plan that has been shown to reduce high blood pressure (hypertension). It may also reduce your risk for type 2 diabetes, heart disease, and stroke. The DASH eating plan may also help with weight loss. What are tips for following this plan? General guidelines  Avoid eating more than 2,300 mg (milligrams) of salt (sodium) a day. If you have hypertension, you may need to reduce your sodium intake to 1,500 mg a day.  Limit alcohol intake to no more than 1 drink a day for nonpregnant women and 2 drinks a day for men. One drink equals 12 oz of beer, 5 oz of wine, or 1 oz of hard liquor.  Work with your health care provider to maintain a healthy body weight or to lose weight. Ask what an ideal weight is for you.  Get at least 30 minutes of exercise that causes your heart to beat faster (aerobic exercise) most days of the week. Activities may include walking, swimming, or biking.  Work with your health care provider or diet and nutrition specialist (dietitian) to adjust your eating plan to your individual calorie needs. Reading food labels  Check food labels for the amount of sodium per serving. Choose foods with less than 5 percent of the Daily Value of sodium. Generally, foods with less than 300 mg of sodium per serving fit into this eating plan.  To find whole grains, look for the word "whole" as the first word in the ingredient list. Shopping  Buy products labeled as "low-sodium" or "no salt added."  Buy fresh foods. Avoid canned foods and premade or frozen meals. Cooking  Avoid adding salt when cooking. Use salt-free seasonings or herbs instead of table salt or sea salt. Check with your health care provider or pharmacist before using salt substitutes.  Do not fry foods. Cook foods using healthy methods such as baking, boiling, grilling, and broiling instead.  Cook with heart-healthy oils, such as olive, canola, soybean, or sunflower oil. Meal  planning   Eat a balanced diet that includes: ? 5 or more servings of fruits and vegetables each day. At each meal, try to fill half of your plate with fruits and vegetables. ? Up to 6-8 servings of whole grains each day. ? Less than 6 oz of lean meat, poultry, or fish each day. A 3-oz serving of meat is about the same size as a deck of cards. One egg equals 1 oz. ? 2 servings of low-fat dairy each day. ? A serving of nuts, seeds, or beans 5 times each week. ? Heart-healthy fats. Healthy fats called Omega-3 fatty acids are found in foods such as flaxseeds and coldwater fish, like sardines, salmon, and mackerel.  Limit how much you eat of the   following: ? Canned or prepackaged foods. ? Food that is high in trans fat, such as fried foods. ? Food that is high in saturated fat, such as fatty meat. ? Sweets, desserts, sugary drinks, and other foods with added sugar. ? Full-fat dairy products.  Do not salt foods before eating.  Try to eat at least 2 vegetarian meals each week.  Eat more home-cooked food and less restaurant, buffet, and fast food.  When eating at a restaurant, ask that your food be prepared with less salt or no salt, if possible. What foods are recommended? The items listed may not be a complete list. Talk with your dietitian about what dietary choices are best for you. Grains Whole-grain or whole-wheat bread. Whole-grain or whole-wheat pasta. Brown rice. Oatmeal. Quinoa. Bulgur. Whole-grain and low-sodium cereals. Pita bread. Low-fat, low-sodium crackers. Whole-wheat flour tortillas. Vegetables Fresh or frozen vegetables (raw, steamed, roasted, or grilled). Low-sodium or reduced-sodium tomato and vegetable juice. Low-sodium or reduced-sodium tomato sauce and tomato paste. Low-sodium or reduced-sodium canned vegetables. Fruits All fresh, dried, or frozen fruit. Canned fruit in natural juice (without added sugar). Meat and other protein foods Skinless chicken or turkey.  Ground chicken or turkey. Pork with fat trimmed off. Fish and seafood. Egg whites. Dried beans, peas, or lentils. Unsalted nuts, nut butters, and seeds. Unsalted canned beans. Lean cuts of beef with fat trimmed off. Low-sodium, lean deli meat. Dairy Low-fat (1%) or fat-free (skim) milk. Fat-free, low-fat, or reduced-fat cheeses. Nonfat, low-sodium ricotta or cottage cheese. Low-fat or nonfat yogurt. Low-fat, low-sodium cheese. Fats and oils Soft margarine without trans fats. Vegetable oil. Low-fat, reduced-fat, or light mayonnaise and salad dressings (reduced-sodium). Canola, safflower, olive, soybean, and sunflower oils. Avocado. Seasoning and other foods Herbs. Spices. Seasoning mixes without salt. Unsalted popcorn and pretzels. Fat-free sweets. What foods are not recommended? The items listed may not be a complete list. Talk with your dietitian about what dietary choices are best for you. Grains Baked goods made with fat, such as croissants, muffins, or some breads. Dry pasta or rice meal packs. Vegetables Creamed or fried vegetables. Vegetables in a cheese sauce. Regular canned vegetables (not low-sodium or reduced-sodium). Regular canned tomato sauce and paste (not low-sodium or reduced-sodium). Regular tomato and vegetable juice (not low-sodium or reduced-sodium). Pickles. Olives. Fruits Canned fruit in a light or heavy syrup. Fried fruit. Fruit in cream or butter sauce. Meat and other protein foods Fatty cuts of meat. Ribs. Fried meat. Bacon. Sausage. Bologna and other processed lunch meats. Salami. Fatback. Hotdogs. Bratwurst. Salted nuts and seeds. Canned beans with added salt. Canned or smoked fish. Whole eggs or egg yolks. Chicken or turkey with skin. Dairy Whole or 2% milk, cream, and half-and-half. Whole or full-fat cream cheese. Whole-fat or sweetened yogurt. Full-fat cheese. Nondairy creamers. Whipped toppings. Processed cheese and cheese spreads. Fats and oils Butter. Stick  margarine. Lard. Shortening. Ghee. Bacon fat. Tropical oils, such as coconut, palm kernel, or palm oil. Seasoning and other foods Salted popcorn and pretzels. Onion salt, garlic salt, seasoned salt, table salt, and sea salt. Worcestershire sauce. Tartar sauce. Barbecue sauce. Teriyaki sauce. Soy sauce, including reduced-sodium. Steak sauce. Canned and packaged gravies. Fish sauce. Oyster sauce. Cocktail sauce. Horseradish that you find on the shelf. Ketchup. Mustard. Meat flavorings and tenderizers. Bouillon cubes. Hot sauce and Tabasco sauce. Premade or packaged marinades. Premade or packaged taco seasonings. Relishes. Regular salad dressings. Where to find more information:  National Heart, Lung, and Blood Institute: www.nhlbi.nih.gov  American Heart Association: www.heart.org   Summary  The DASH eating plan is a healthy eating plan that has been shown to reduce high blood pressure (hypertension). It may also reduce your risk for type 2 diabetes, heart disease, and stroke.  With the DASH eating plan, you should limit salt (sodium) intake to 2,300 mg a day. If you have hypertension, you may need to reduce your sodium intake to 1,500 mg a day.  When on the DASH eating plan, aim to eat more fresh fruits and vegetables, whole grains, lean proteins, low-fat dairy, and heart-healthy fats.  Work with your health care provider or diet and nutrition specialist (dietitian) to adjust your eating plan to your individual calorie needs. This information is not intended to replace advice given to you by your health care provider. Make sure you discuss any questions you have with your health care provider. Document Released: 10/18/2011 Document Revised: 10/22/2016 Document Reviewed: 10/22/2016 Elsevier Interactive Patient Education  2017 [employer] Cough, Adult Coughing is a reflex that clears your throat and your airways. Coughing helps to heal and protect your lungs. It is normal to cough occasionally,  but a cough that happens with other symptoms or lasts a long time may be a sign of a condition that needs treatment. A cough may last only 2-3 weeks (acute), or it may last longer than 8 weeks (chronic). What are the causes? Coughing is commonly caused by:  Breathing in substances that irritate your lungs.  A viral or bacterial respiratory infection.  Allergies.  Asthma.  Postnasal drip.  Smoking.  Acid backing up from the stomach into the esophagus (gastroesophageal reflux).  Certain medicines.  Chronic lung problems, including COPD (or rarely, lung cancer).  Other medical conditions such as heart failure.  Follow these instructions at home: Pay attention to any changes in your symptoms. Take these actions to help with your discomfort:  Take medicines only as told by your health care provider. ? If you were prescribed an antibiotic medicine, take it as told by your health care provider. Do not stop taking the antibiotic even if you start to feel better. ? Talk with your health care provider before you take a cough suppressant medicine.  Drink enough fluid to keep your urine clear or pale yellow.  If the air is dry, use a cold steam vaporizer or humidifier in your bedroom or your home to help loosen secretions.  Avoid anything that causes you to cough at work or at home.  If your cough is worse at night, try sleeping in a semi-upright position.  Avoid cigarette smoke. If you smoke, quit smoking. If you need help quitting, ask your health care provider.  Avoid caffeine.  Avoid alcohol.  Rest as needed.  Contact a health care provider if:  You have new symptoms.  You cough up pus.  Your cough does not get better after 2-3 weeks, or your cough gets worse.  You cannot control your cough with suppressant medicines and you are losing sleep.  You develop pain that is getting worse or pain that is not controlled with pain medicines.  You have a fever.  You have  unexplained weight loss.  You have night sweats. Get help right away if:  You cough up blood.  You have difficulty breathing.  Your heartbeat is very fast. This information is not intended to replace advice given to you by your health care provider. Make sure you discuss any questions you have with your health care provider. Document Released: 04/27/2011 Document Revised: 04/05/2016  Document Reviewed: 01/05/2015 Elsevier Interactive Patient Education  2017 [employer].

## 2017-08-14 NOTE — Progress Notes (Signed)
Kara Bailey, is a 61 y.o. female  [id number]  [id number]  DOB - 1956/07/24  Chief Complaint  Patient presents with  . Establish Care      Subjective:   Kara Bailey is a 61 y.o. female with a history of hypertension, anxiety, GERD, insomnia who presents today to establish care with me, medication refills and for urgent care visit. She is complaining of cough productive of brownish sputum, sore throat  And body aches. She denies fever. Denies any sick contact. She has a long history of alcohol abuse. She drinks 1 can of beer twice daily currently. She is smoking. She is taking chantix. She is taking Prozac. She is taking Remeron 15 mg nighty. She is taking buspar 5 mg BID, she would like to increase the dose to 10 mg, denies dizziness. She is stressed by unemployment. She is stressed by family demands. Patient has No headache, No chest pain, No abdominal pain - No Nausea, No new weakness tingling or numbness. She has been adherent with her antihypertensive and denies any side effects. She continues to smoke cigarette. She is due for mammogram.    Problem  Chronic Cough  Essential Hypertension    ALLERGIES: No Known Allergies  PAST MEDICAL HISTORY: Past Medical History:  Diagnosis Date  . Anxiety   . Chest pain 08/2014   unspecified  . Dizziness 08/2014  . Dyspnea 08/2014  . GERD (gastroesophageal reflux disease)   . Hepatitis C   . Hepatitis C virus infection   . Hypertension   . PAC (premature atrial contraction) 10/11/2014  . Palpitations   . PVC (premature ventricular contraction) 10/11/2014  . Shingles    left side  . 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 Tachycardia 08/2014  . Weakness 08/2014    MEDICATIONS AT HOME: Prior to Admission medications   Medication Sig Start Date End Date Taking? Authorizing Provider  busPIRone (BUSPAR) 10 MG tablet Take 1 tablet (10 mg total) by mouth 2 (two) times daily. 08/14/17   Tresa Garter, MD   diltiazem (CARDIZEM CD) 180 MG 24 hr capsule Take 1 capsule (180 mg total) by mouth daily. 08/14/17   Tresa Garter, MD  doxycycline (VIBRA-TABS) 100 MG tablet Take 1 tablet (100 mg total) by mouth 2 (two) times daily. 08/14/17 08/24/17  Tresa Garter, MD  FLUoxetine (PROZAC) 20 MG tablet Take 1 tablet (20 mg total) by mouth daily. 08/14/17   Tresa Garter, MD  guaiFENesin-dextromethorphan (ROBITUSSIN DM) 100-10 MG/5ML syrup Take 5 mLs by mouth every 4 (four) hours as needed for cough. 08/14/17   Tresa Garter, MD  methocarbamol (ROBAXIN) 500 MG tablet Take 1 tablet (500 mg total) by mouth 2 (two) times daily as needed for muscle spasms. 03/12/17   Funches, Adriana Mccallum, MD  mirtazapine (REMERON) 15 MG tablet Take 1 tablet (15 mg total) by mouth at bedtime. 08/14/17   Tresa Garter, MD  naproxen (NAPROSYN) 500 MG tablet Take 1 tablet (500 mg total) by mouth 2 (two) times daily with a meal. 02/12/17   Funches, Josalyn, MD  pantoprazole (PROTONIX) 40 MG tablet Take 1 tablet (40 mg total) by mouth daily. 08/14/17   Tresa Garter, MD  varenicline (CHANTIX CONTINUING MONTH PAK) 1 MG tablet Take 1 tablet (1 mg total) by mouth 2 (two) times daily. 10/29/16   Boykin Nearing, MD    Objective:   Vitals:   08/14/17 1546  BP: 138/76  Pulse: 89  Resp: 18  Temp:  98.8 F (37.1 C)  TempSrc: Oral  SpO2: 99%  Weight: 135 lb (61.2 kg)  Height: 5' 6" (1.676 m)   Exam General appearance : Awake, alert, not in any distress. Speech Clear. Not toxic looking HEENT: Atraumatic and Normocephalic, pupils equally reactive to light and accomodation Neck: Supple, no JVD. No cervical lymphadenopathy.  Chest: Good air entry bilaterally, no added sounds  CVS: S1 S2 regular, no murmurs.  Abdomen: Bowel sounds present, Non tender and not distended with no gaurding, rigidity or rebound. Extremities: B/L Lower Ext shows no edema, both legs are warm to touch Neurology: Awake alert, and  oriented X 3, CN II-XII intact, Non focal Skin: No Rash  Data Review No results found for: HGBA1C  Assessment & Plan   1. Essential hypertension  - diltiazem (CARDIZEM CD) 180 MG 24 hr capsule; Take 1 capsule (180 mg total) by mouth daily.  Dispense: 90 capsule; Refill: 3 - CBC with Differential/Platelet - CMP14+EGFR - Lipid panel - TSH  2. Smoking Kara Bailey was counseled on the dangers of tobacco use, and was advised to quit. Reviewed strategies to maximize success, including removing cigarettes and smoking materials from environment, stress management and support of family/friends.  3. Generalized anxiety disorder Refill - Increase dose to 10 mg: busPIRone (BUSPAR) 10 MG tablet; Take 1 tablet (10 mg total) by mouth 2 (two) times daily.  Dispense: 60 tablet; Refill: 5 - FLUoxetine (PROZAC) 20 MG tablet; Take 1 tablet (20 mg total) by mouth daily.  Dispense: 90 tablet; Refill: 3 - mirtazapine (REMERON) 15 MG tablet; Take 1 tablet (15 mg total) by mouth at bedtime.  Dispense: 90 tablet; Refill: 3  4. Chronic cough  - DG Chest 2 View; Future - doxycycline (VIBRA-TABS) 100 MG tablet; Take 1 tablet (100 mg total) by mouth 2 (two) times daily.  Dispense: 20 tablet; Refill: 0 - guaiFENesin-dextromethorphan (ROBITUSSIN DM) 100-10 MG/5ML syrup; Take 5 mLs by mouth every 4 (four) hours as needed for cough.  Dispense: 480 mL; Refill: 0  5. Gastroesophageal reflux disease, esophagitis presence not specified  - pantoprazole (PROTONIX) 40 MG tablet; Take 1 tablet (40 mg total) by mouth daily.  Dispense: 30 tablet; Refill: 2  6. Breast cancer screening  - MM Digital Screening; Future  Patient have been counseled extensively about nutrition and exercise. Other issues discussed during this visit include: low cholesterol diet, weight control and daily exercise, importance of adherence with medications and regular follow-up. We also discussed long term complications of uncontrolled hypertension.     Return in about 6 months (around 02/12/2018) for Follow up HTN.  The patient was given clear instructions to go to ER or return to medical center if symptoms don't improve, worsen or new problems develop. The patient verbalized understanding. The patient was told to call to get lab results if they haven't heard anything in the next week.   This note has been created with Surveyor, quantity. Any transcriptional errors are unintentional.    Angelica Chessman, MD, [place], [place], [place], [place] and [place], [place]   08/14/2017, 3:52 PM

## 2017-08-15 LAB — CBC WITH DIFFERENTIAL/PLATELET
Basophils Absolute: 0 10*3/uL (ref 0.0–0.2)
Basos: 0 %
EOS (ABSOLUTE): 0.1 10*3/uL (ref 0.0–0.4)
EOS: 1 %
HEMATOCRIT: 48.2 % — AB (ref 34.0–46.6)
Hemoglobin: 16.1 g/dL — ABNORMAL HIGH (ref 11.1–15.9)
IMMATURE GRANS (ABS): 0 10*3/uL (ref 0.0–0.1)
IMMATURE GRANULOCYTES: 0 %
LYMPHS: 32 %
Lymphocytes Absolute: 2.3 10*3/uL (ref 0.7–3.1)
MCH: 32.9 pg (ref 26.6–33.0)
MCHC: 33.4 g/dL (ref 31.5–35.7)
MCV: 98 fL — ABNORMAL HIGH (ref 79–97)
MONOCYTES: 8 %
MONOS ABS: 0.6 10*3/uL (ref 0.1–0.9)
NEUTROS PCT: 59 %
Neutrophils Absolute: 4.2 10*3/uL (ref 1.4–7.0)
Platelets: 222 10*3/uL (ref 150–379)
RBC: 4.9 x10E6/uL (ref 3.77–5.28)
RDW: 13.7 % (ref 12.3–15.4)
WBC: 7.2 10*3/uL (ref 3.4–10.8)

## 2017-08-15 LAB — CMP14+EGFR
ALBUMIN: 4.3 g/dL (ref 3.6–4.8)
ALK PHOS: 111 IU/L (ref 39–117)
ALT: 17 IU/L (ref 0–32)
AST: 22 IU/L (ref 0–40)
Albumin/Globulin Ratio: 1.7 (ref 1.2–2.2)
BUN / CREAT RATIO: 9 — AB (ref 12–28)
BUN: 7 mg/dL — ABNORMAL LOW (ref 8–27)
Bilirubin Total: 0.3 mg/dL (ref 0.0–1.2)
CO2: 21 mmol/L (ref 20–29)
CREATININE: 0.82 mg/dL (ref 0.57–1.00)
Calcium: 9.4 mg/dL (ref 8.7–10.3)
Chloride: 103 mmol/L (ref 96–106)
GFR calc Af Amer: 88 mL/min/{1.73_m2} (ref >59)
GFR calc non Af Amer: 76 mL/min/{1.73_m2} (ref >59)
GLOBULIN, TOTAL: 2.5 g/dL (ref 1.5–4.5)
Glucose: 82 mg/dL (ref 65–99)
Potassium: 4.2 mmol/L (ref 3.5–5.2)
SODIUM: 139 mmol/L (ref 134–144)
Total Protein: 6.8 g/dL (ref 6.0–8.5)

## 2017-08-15 LAB — LIPID PANEL
CHOL/HDL RATIO: 4 ratio (ref 0.0–4.4)
CHOLESTEROL TOTAL: 156 mg/dL (ref 100–199)
HDL: 39 mg/dL — ABNORMAL LOW (ref >39)
LDL CALC: 95 mg/dL (ref 0–99)
Triglycerides: 111 mg/dL (ref 0–149)
VLDL Cholesterol Cal: 22 mg/dL (ref 5–40)

## 2017-08-15 LAB — TSH: TSH: 1.91 u[IU]/mL (ref 0.450–4.500)

## 2017-08-16 MED FILL — DILTIAZEM 24HR ER 180 MG CA: 180 | 30 days supply | Qty: 30 | Fill #0

## 2017-08-20 ENCOUNTER — Telehealth: Payer: Self-pay | Admitting: *Deleted

## 2017-08-20 NOTE — Telephone Encounter (Signed)
Patient verified DOB Patient was informed her lab results were normal. Patient had no further questions.

## 2017-08-20 NOTE — Telephone Encounter (Signed)
-----   Message from Tresa Garter, MD sent at 08/19/2017 12:38 PM EDT ----- Lab results are normal.

## 2017-09-13 MED FILL — ?MIRTAZAPINE 15 MG TABLET: 15 | 30 days supply | Qty: 30 | Fill #1

## 2017-09-13 MED FILL — DILTIAZEM 24HR ER 180 MG CA: 180 | 30 days supply | Qty: 30 | Fill #1

## 2017-10-14 MED FILL — ?MIRTAZAPINE 15 MG TABLET: 15 | 30 days supply | Qty: 30 | Fill #2

## 2017-11-13 MED FILL — ?MIRTAZAPINE 15 MG TABLET: 15 | 30 days supply | Qty: 30 | Fill #3

## 2017-11-26 ENCOUNTER — Ambulatory Visit: Payer: Self-pay | Attending: Family Medicine

## 2017-11-29 ENCOUNTER — Ambulatory Visit: Payer: Self-pay | Admitting: Family Medicine

## 2017-12-02 ENCOUNTER — Ambulatory Visit: Payer: Self-pay | Attending: Family Medicine | Admitting: Family Medicine

## 2017-12-02 ENCOUNTER — Encounter: Payer: Self-pay | Admitting: Family Medicine

## 2017-12-02 VITALS — BP 128/83 | HR 93 | Temp 98.0°F | Ht 66.0 in | Wt 135.2 lb

## 2017-12-02 DIAGNOSIS — I10 Essential (primary) hypertension: Secondary | ICD-10-CM

## 2017-12-02 DIAGNOSIS — G4700 Insomnia, unspecified: Secondary | ICD-10-CM | POA: Insufficient documentation

## 2017-12-02 DIAGNOSIS — R112 Nausea with vomiting, unspecified: Secondary | ICD-10-CM | POA: Insufficient documentation

## 2017-12-02 DIAGNOSIS — R05 Cough: Secondary | ICD-10-CM

## 2017-12-02 DIAGNOSIS — I493 Ventricular premature depolarization: Secondary | ICD-10-CM | POA: Insufficient documentation

## 2017-12-02 DIAGNOSIS — R053 Chronic cough: Secondary | ICD-10-CM

## 2017-12-02 DIAGNOSIS — K219 Gastro-esophageal reflux disease without esophagitis: Secondary | ICD-10-CM

## 2017-12-02 DIAGNOSIS — F411 Generalized anxiety disorder: Secondary | ICD-10-CM

## 2017-12-02 DIAGNOSIS — Z79899 Other long term (current) drug therapy: Secondary | ICD-10-CM | POA: Insufficient documentation

## 2017-12-02 DIAGNOSIS — B1920 Unspecified viral hepatitis C without hepatic coma: Secondary | ICD-10-CM | POA: Insufficient documentation

## 2017-12-02 DIAGNOSIS — R0600 Dyspnea, unspecified: Secondary | ICD-10-CM | POA: Insufficient documentation

## 2017-12-02 DIAGNOSIS — G4709 Other insomnia: Secondary | ICD-10-CM

## 2017-12-02 DIAGNOSIS — M62830 Muscle spasm of back: Secondary | ICD-10-CM

## 2017-12-02 DIAGNOSIS — I491 Atrial premature depolarization: Secondary | ICD-10-CM | POA: Insufficient documentation

## 2017-12-02 DIAGNOSIS — B349 Viral infection, unspecified: Secondary | ICD-10-CM

## 2017-12-02 MED ORDER — OSELTAMIVIR PHOSPHATE 75 MG PO CAPS
75.0000 mg | ORAL_CAPSULE | Freq: Two times a day (BID) | ORAL | 0 refills | Status: DC
Start: 2017-12-02 — End: 2018-08-13

## 2017-12-02 MED ORDER — MIRTAZAPINE 30 MG PO TABS: 15.0000 mg | ORAL_TABLET | Freq: Every day | ORAL | 3 refills | Status: DC

## 2017-12-02 MED ORDER — PANTOPRAZOLE SODIUM 40 MG PO TBEC: 40.0000 mg | DELAYED_RELEASE_TABLET | Freq: Every day | ORAL | 3 refills | Status: DC

## 2017-12-02 MED ORDER — DILTIAZEM HCL ER COATED BEADS 180 MG PO CP24: 180.0000 mg | ORAL_CAPSULE | Freq: Every day | ORAL | 3 refills | Status: DC

## 2017-12-02 MED ORDER — METHOCARBAMOL 500 MG PO TABS: 500.0000 mg | ORAL_TABLET | Freq: Two times a day (BID) | ORAL | 2 refills | Status: DC | PRN

## 2017-12-02 MED ORDER — BUSPIRONE HCL 15 MG PO TABS: 15.0000 mg | ORAL_TABLET | Freq: Two times a day (BID) | ORAL | 3 refills | Status: DC

## 2017-12-02 MED ORDER — BUSPIRONE HCL 7.5 MG PO TABS: ORAL_TABLET | ORAL | 0 refills | Status: DC

## 2017-12-02 MED ORDER — GUAIFENESIN-DM 100-10 MG/5ML PO SYRP: 5.0000 mL | ORAL_SOLUTION | ORAL | 0 refills | Status: DC | PRN

## 2017-12-02 MED FILL — busPIRone HCL 7.5 MG TABS: 7.5 | 30 days supply | Qty: 120 | Fill #0

## 2017-12-02 MED FILL — ?PANTOPRAZOLE SOD DR 40MG: 40 MG | 30 days supply | Qty: 30 | Fill #0

## 2017-12-02 MED FILL — METHOCARBAMOL 500 MG TABLET: 500 | 30 days supply | Qty: 60 | Fill #0

## 2017-12-02 MED FILL — ROBAFEN-DM SYRUP: 100-10 | 15 days supply | Qty: 473 | Fill #0

## 2017-12-02 MED FILL — DILTIAZEM 24HR ER 180 MG CA: 180 | 30 days supply | Qty: 30 | Fill #0

## 2017-12-02 MED FILL — ?MIRTAZAPINE 30 MG TABLET: 30 | 30 days supply | Qty: 15 | Fill #0

## 2017-12-02 NOTE — Progress Notes (Signed)
Subjective:  Patient ID: Kara Bailey, female    DOB: 09-11-1956  Age: 62 y.o. MRN: [id number]  CC: Cough; Nausea; and Vomiting   HPI Kara Bailey is a 62 year old female with a history of hypertension, anxiety, GERD, insomnia who presents today to establish care with me and complains of an acute illness for the last 5 days.  She complains of headache, sore throat, nausea and vomiting for the last 5 days and endorses a fever of 102 with associated chills.  She has also been coughing with production of whitish sputum however she informs me she has had a cough for the last 4 weeks. Denies history of sick contacts.  She is currently on mirtazapine for insomnia and would like an increase in the dose due to the fact that she is still not getting restful sleep . For her anxiety she remains on BuSpar and Prozac but stopped taking Prozac due to a funny sensation in her head.  She has been compliant with her antihypertensive and denies any side effects. Reflux symptoms have also been controlled.  Past Medical History:  Diagnosis Date  . Anxiety   . Chest pain 08/2014   unspecified  . Dizziness 08/2014  . Dyspnea 08/2014  . GERD (gastroesophageal reflux disease)   . Hepatitis C   . Hepatitis C virus infection   . Hypertension   . PAC (premature atrial contraction) 10/11/2014  . Palpitations   . PVC (premature ventricular contraction) 10/11/2014  . Shingles    left side  . 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 Tachycardia 08/2014  . Weakness 08/2014    Past Surgical History:  Procedure Laterality Date  . APPENDECTOMY    . COLONOSCOPY WITH PROPOFOL N/A 04/30/2016   Procedure: COLONOSCOPY WITH PROPOFOL;  Surgeon: Daneil Dolin, MD;  Location: [facility];  Service: Endoscopy;  Laterality: N/A;  1230  . ESOPHAGOGASTRODUODENOSCOPY     approximately 2010  . ESOPHAGOGASTRODUODENOSCOPY (EGD) WITH PROPOFOL N/A 04/30/2016   Procedure: ESOPHAGOGASTRODUODENOSCOPY  (EGD) WITH PROPOFOL;  Surgeon: Daneil Dolin, MD;  Location: [facility];  Service: Endoscopy;  Laterality: N/A;  . LEFT HEART CATHETERIZATION WITH CORONARY ANGIOGRAM N/A 10/29/2014   Procedure: LEFT HEART CATHETERIZATION WITH CORONARY ANGIOGRAM;  Surgeon: Burnell Blanks, MD;  Location: [facility] CATH LAB;  Service: Cardiovascular;  Laterality: N/A;  . Venia Minks DILATION N/A 04/30/2016   Procedure: Venia Minks DILATION;  Surgeon: Daneil Dolin, MD;  Location: [facility];  Service: Endoscopy;  Laterality: N/A;  . POLYPECTOMY  04/30/2016   Procedure: POLYPECTOMY;  Surgeon: Daneil Dolin, MD;  Location: [facility];  Service: Endoscopy;;  Sigmoid colon polyp removed via hot snare     Outpatient Medications Prior to Visit  Medication Sig Dispense Refill  . naproxen (NAPROSYN) 500 MG tablet Take 1 tablet (500 mg total) by mouth 2 (two) times daily with a meal. 60 tablet 1  . pantoprazole (PROTONIX) 40 MG tablet Take 1 tablet (40 mg total) by mouth daily. 30 tablet 2  . varenicline (CHANTIX CONTINUING MONTH PAK) 1 MG tablet Take 1 tablet (1 mg total) by mouth 2 (two) times daily. 56 tablet 0  . busPIRone (BUSPAR) 10 MG tablet Take 1 tablet (10 mg total) by mouth 2 (two) times daily. 60 tablet 5  . diltiazem (CARDIZEM CD) 180 MG 24 hr capsule Take 1 capsule (180 mg total) by mouth daily. 90 capsule 3  . FLUoxetine (PROZAC) 20 MG tablet Take 1 tablet (20 mg  total) by mouth daily. 90 tablet 3  . methocarbamol (ROBAXIN) 500 MG tablet Take 1 tablet (500 mg total) by mouth 2 (two) times daily as needed for muscle spasms. 60 tablet 2  . mirtazapine (REMERON) 15 MG tablet Take 1 tablet (15 mg total) by mouth at bedtime. 90 tablet 3  . guaiFENesin-dextromethorphan (ROBITUSSIN DM) 100-10 MG/5ML syrup Take 5 mLs by mouth every 4 (four) hours as needed for cough. (Patient not taking: Reported on 12/02/2017) 480 mL 0   No facility-administered medications prior to visit.     ROS Review of Systems    Constitutional: Positive for chills, fatigue and fever. Negative for activity change and appetite change.  HENT: Negative for congestion, sinus pressure and sore throat.   Eyes: Negative for visual disturbance.  Respiratory: Positive for cough. Negative for chest tightness, shortness of breath and wheezing.   Cardiovascular: Negative for chest pain and palpitations.  Gastrointestinal: Positive for nausea and vomiting. Negative for abdominal distention, abdominal pain and constipation.  Endocrine: Negative for polydipsia.  Genitourinary: Negative for dysuria and frequency.  Musculoskeletal: Negative for arthralgias and back pain.  Skin: Negative for rash.  Neurological: Negative for tremors, light-headedness and numbness.  Hematological: Does not bruise/bleed easily.  Psychiatric/Behavioral: Negative for agitation and behavioral problems.    Objective:  BP 128/83   Pulse 93   Temp 98 F (36.7 C) (Oral)   Ht 5\' 6"  (1.676 m)   Wt 135 lb 3.2 oz (61.3 kg)   SpO2 97%   BMI 21.82 kg/m   BP/Weight 12/02/2017 16/11/958 02/14/4097  Systolic BP 119 147 829  Diastolic BP 83 76 78  Wt. (Lbs) 135.2 135 131.6  BMI 21.82 21.79 21.24      Physical Exam  Constitutional: She is oriented to person, place, and time.  Ill looking  Cardiovascular: Normal rate, normal heart sounds and intact distal pulses.  No murmur heard. Pulmonary/Chest: Effort normal and breath sounds normal. She has no wheezes. She has no rales. She exhibits no tenderness.  Abdominal: Soft. Bowel sounds are normal. She exhibits no distension and no mass. There is no tenderness.  Musculoskeletal: Normal range of motion.  Neurological: She is alert and oriented to person, place, and time.  Skin: Skin is warm and dry.  Psychiatric: She has a normal mood and affect.     Assessment & Plan:   1. Generalized anxiety disorder Uncontrolled Increased dose of BuSpar Discontinue Prozac patient request - busPIRone (BUSPAR) 15  MG tablet; Take 1 tablet (15 mg total) by mouth 2 (two) times daily.  Dispense: 60 tablet; Refill: 3  2. Essential hypertension Controlled Counseled on blood pressure goal of less than 130/80, low-sodium, DASH diet, medication compliance, 150 minutes of moderate intensity exercise per week. Discussed medication compliance, adverse effects. - diltiazem (CARDIZEM CD) 180 MG 24 hr capsule; Take 1 capsule (180 mg total) by mouth daily.  Dispense: 90 capsule; Refill: 3  3. Back muscle spasm Stable - methocarbamol (ROBAXIN) 500 MG tablet; Take 1 tablet (500 mg total) by mouth 2 (two) times daily as needed for muscle spasms.  Dispense: 60 tablet; Refill: 2  4. Viral illness We will treat presumptively for the flu - Influenza A and B, RT PCR - Respiratory virus panel - oseltamivir (TAMIFLU) 75 MG capsule; Take 1 capsule (75 mg total) by mouth 2 (two) times daily.  Dispense: 10 capsule; Refill: 0  5. Gastroesophageal reflux disease, esophagitis presence not specified Continue Protonix  6. Chronic cough - guaiFENesin-dextromethorphan (ROBITUSSIN  DM) 100-10 MG/5ML syrup; Take 5 mLs by mouth every 4 (four) hours as needed for cough.  Dispense: 480 mL; Refill: 0  7. Other insomnia Uncontrolled Sleep hygiene Increase dose of Remeron - mirtazapine (REMERON) 30 MG tablet; Take 0.5 tablets (15 mg total) by mouth at bedtime.  Dispense: 30 tablet; Refill: 3   Meds ordered this encounter  Medications  . busPIRone (BUSPAR) 15 MG tablet    Sig: Take 1 tablet (15 mg total) by mouth 2 (two) times daily.    Dispense:  60 tablet    Refill:  3    Discontinue previous dose  . diltiazem (CARDIZEM CD) 180 MG 24 hr capsule    Sig: Take 1 capsule (180 mg total) by mouth daily.    Dispense:  90 capsule    Refill:  3  . mirtazapine (REMERON) 30 MG tablet    Sig: Take 0.5 tablets (15 mg total) by mouth at bedtime.    Dispense:  30 tablet    Refill:  3  . methocarbamol (ROBAXIN) 500 MG tablet    Sig: Take  1 tablet (500 mg total) by mouth 2 (two) times daily as needed for muscle spasms.    Dispense:  60 tablet    Refill:  2  . pantoprazole (PROTONIX) 40 MG tablet    Sig: Take 1 tablet (40 mg total) by mouth daily.    Dispense:  30 tablet    Refill:  3  . guaiFENesin-dextromethorphan (ROBITUSSIN DM) 100-10 MG/5ML syrup    Sig: Take 5 mLs by mouth every 4 (four) hours as needed for cough.    Dispense:  480 mL    Refill:  0  . oseltamivir (TAMIFLU) 75 MG capsule    Sig: Take 1 capsule (75 mg total) by mouth 2 (two) times daily.    Dispense:  10 capsule    Refill:  0    Follow-up: No Follow-up on file.   Arnoldo Morale MD

## 2017-12-02 NOTE — Patient Instructions (Signed)
Viral Illness, Adult Viruses are tiny germs that can get into a person's body and cause illness. There are many different types of viruses, and they cause many types of illness. Viral illnesses can range from mild to severe. They can affect various parts of the body. Common illnesses that are caused by a virus include colds and the flu. Viral illnesses also include serious conditions such as HIV/AIDS (human immunodeficiency virus/acquired immunodeficiency syndrome). A few viruses have been linked to certain cancers. What are the causes? Many types of viruses can cause illness. Viruses invade cells in your body, multiply, and cause the infected cells to malfunction or die. When the cell dies, it releases more of the virus. When this happens, you develop symptoms of the illness, and the virus continues to spread to other cells. If the virus takes over the function of the cell, it can cause the cell to divide and grow out of control, as is the case when a virus causes cancer. Different viruses get into the body in different ways. You can get a virus by:  Swallowing food or water that is contaminated with the virus.  Breathing in droplets that have been coughed or sneezed into the air by an infected person.  Touching a surface that has been contaminated with the virus and then touching your eyes, nose, or mouth.  Being bitten by an insect or animal that carries the virus.  Having sexual contact with a person who is infected with the virus.  Being exposed to blood or fluids that contain the virus, either through an open cut or during a transfusion.  If a virus enters your body, your body's defense system (immune system) will try to fight the virus. You may be at higher risk for a viral illness if your immune system is weak. What are the signs or symptoms? Symptoms vary depending on the type of virus and the location of the cells that it invades. Common symptoms of the main types of viral illnesses  include: Cold and flu viruses  Fever.  Headache.  Sore throat.  Muscle aches.  Nasal congestion.  Cough. Digestive system (gastrointestinal) viruses  Fever.  Abdominal pain.  Nausea.  Diarrhea. Liver viruses (hepatitis)  Loss of appetite.  Tiredness.  Yellowing of the skin (jaundice). Brain and spinal cord viruses  Fever.  Headache.  Stiff neck.  Nausea and vomiting.  Confusion or sleepiness. Skin viruses  Warts.  Itching.  Rash. Sexually transmitted viruses  Discharge.  Swelling.  Redness.  Rash. How is this treated? Viruses can be difficult to treat because they live within cells. Antibiotic medicines do not treat viruses because these drugs do not get inside cells. Treatment for a viral illness may include:  Resting and drinking plenty of fluids.  Medicines to relieve symptoms. These can include over-the-counter medicine for pain and fever, medicines for cough or congestion, and medicines to relieve diarrhea.  Antiviral medicines. These drugs are available only for certain types of viruses. They may help reduce flu symptoms if taken early. There are also many antiviral medicines for hepatitis and HIV/AIDS.  Some viral illnesses can be prevented with vaccinations. A common example is the flu shot. Follow these instructions at home: Medicines   Take over-the-counter and prescription medicines only as told by your health care provider.  If you were prescribed an antiviral medicine, take it as told by your health care provider. Do not stop taking the medicine even if you start to feel better.  Be aware   of when antibiotics are needed and when they are not needed. Antibiotics do not treat viruses. If your health care provider thinks that you may have a bacterial infection as well as a viral infection, you may get an antibiotic. ? Do not ask for an antibiotic prescription if you have been diagnosed with a viral illness. That will not make your  illness go away faster. ? Frequently taking antibiotics when they are not needed can lead to antibiotic resistance. When this develops, the medicine no longer works against the bacteria that it normally fights. General instructions  Drink enough fluids to keep your urine clear or pale yellow.  Rest as much as possible.  Return to your normal activities as told by your health care provider. Ask your health care provider what activities are safe for you.  Keep all follow-up visits as told by your health care provider. This is important. How is this prevented? Take these actions to reduce your risk of viral infection:  Eat a healthy diet and get enough rest.  Wash your hands often with soap and water. This is especially important when you are in public places. If soap and water are not available, use hand sanitizer.  Avoid close contact with friends and family who have a viral illness.  If you travel to areas where viral gastrointestinal infection is common, avoid drinking water or eating raw food.  Keep your immunizations up to date. Get a flu shot every year as told by your health care provider.  Do not share toothbrushes, nail clippers, razors, or needles with other people.  Always practice safe sex.  Contact a health care provider if:  You have symptoms of a viral illness that do not go away.  Your symptoms come back after going away.  Your symptoms get worse. Get help right away if:  You have trouble breathing.  You have a severe headache or a stiff neck.  You have severe vomiting or abdominal pain. This information is not intended to replace advice given to you by your health care provider. Make sure you discuss any questions you have with your health care provider. Document Released: 03/09/2016 Document Revised: 04/11/2016 Document Reviewed: 03/09/2016 Elsevier Interactive Patient Education  2018 [employer]  

## 2017-12-04 LAB — RESPIRATORY VIRUS PANEL
ADENOVIRUS: NEGATIVE
INFLUENZA A: POSITIVE — AB
INFLUENZA B 1: NEGATIVE
Metapneumovirus: NEGATIVE
PARAINFLUENZA 1 A: NEGATIVE
Parainfluenza 2: NEGATIVE
Parainfluenza 3: NEGATIVE
RESPIRATORY SYNCYTIAL VIRUS B: NEGATIVE
Respiratory Syncytial Virus A: NEGATIVE
Rhinovirus: NEGATIVE

## 2017-12-07 LAB — INFLUENZA A AND B, RT PCR
INFLUENZA B PCR: NEGATIVE
Influenza A PCR: NEGATIVE

## 2018-01-10 MED FILL — ?MIRTAZAPINE 30 MG TABLET: 30 | 30 days supply | Qty: 15 | Fill #1

## 2018-02-10 MED FILL — ?MIRTAZAPINE 30 MG TABLET: 30 | 30 days supply | Qty: 15 | Fill #2

## 2018-03-10 MED FILL — ?MIRTAZAPINE 30 MG TABLET: 30 | 30 days supply | Qty: 15 | Fill #3

## 2018-03-18 ENCOUNTER — Telehealth: Payer: Self-pay | Admitting: Family Medicine

## 2018-03-18 NOTE — Telephone Encounter (Signed)
Patient called requesting a refill on buspirone hcl 7.5. Please let me know so I can call the patient back at [phone]

## 2018-03-19 ENCOUNTER — Other Ambulatory Visit: Payer: Self-pay

## 2018-03-19 MED FILL — ?PANTOPRAZOLE SO DR 40MG TA: 40 | 30 days supply | Qty: 30 | Fill #1

## 2018-03-20 ENCOUNTER — Other Ambulatory Visit: Payer: Self-pay

## 2018-03-20 MED ORDER — BUSPIRONE HCL 7.5 MG PO TABS: ORAL_TABLET | ORAL | 0 refills | Status: DC

## 2018-03-20 NOTE — Telephone Encounter (Signed)
done

## 2018-03-21 MED FILL — busPIRone HCL 15 MG TABS: 15 | 30 days supply | Qty: 60 | Fill #0

## 2018-04-09 MED FILL — ?MIRTAZAPINE 30 MG TABLET: 30 | 30 days supply | Qty: 15 | Fill #4

## 2018-05-09 MED FILL — ?MIRTAZAPINE 30 MG TABLET: 30 | 30 days supply | Qty: 15 | Fill #5

## 2018-05-27 MED FILL — ?PANTOPRAZOLE SO DR 40MG TA: 40 | 30 days supply | Qty: 30 | Fill #2

## 2018-06-03 MED FILL — ?MIRTAZAPINE 30 MG TABLET: 30 | 30 days supply | Qty: 15 | Fill #6

## 2018-06-03 MED FILL — busPIRone HCL 15 MG TABS: 15 | 30 days supply | Qty: 60 | Fill #1

## 2018-07-07 MED FILL — MIRTAZAPINE 30 MG TABLET: 30 | 30 days supply | Qty: 15 | Fill #7

## 2018-08-04 ENCOUNTER — Other Ambulatory Visit: Payer: Self-pay | Admitting: Family Medicine

## 2018-08-04 DIAGNOSIS — G4709 Other insomnia: Secondary | ICD-10-CM

## 2018-08-06 ENCOUNTER — Telehealth: Payer: Self-pay | Admitting: Family Medicine

## 2018-08-06 NOTE — Telephone Encounter (Signed)
Patient would like medication refill, patient has appointment scheduled for 08/20/18. Please follow up.

## 2018-08-07 NOTE — Telephone Encounter (Signed)
Patient would like a refill on Mirtazapine.

## 2018-08-07 NOTE — Telephone Encounter (Signed)
Will refill at an office visit for which she is overdue

## 2018-08-08 NOTE — Telephone Encounter (Signed)
Patient was called and informed that medication will be refilled at [place]. Patient was informed that she could call on Monday to get an earlier appointment with another provider.

## 2018-08-13 ENCOUNTER — Ambulatory Visit: Payer: Self-pay | Attending: Family Medicine | Admitting: Physician Assistant

## 2018-08-13 ENCOUNTER — Other Ambulatory Visit: Payer: Self-pay

## 2018-08-13 VITALS — BP 148/88 | HR 83 | Temp 98.2°F | Resp 18 | Ht 65.0 in | Wt 142.2 lb

## 2018-08-13 DIAGNOSIS — B1920 Unspecified viral hepatitis C without hepatic coma: Secondary | ICD-10-CM | POA: Insufficient documentation

## 2018-08-13 DIAGNOSIS — M62830 Muscle spasm of back: Secondary | ICD-10-CM

## 2018-08-13 DIAGNOSIS — G4700 Insomnia, unspecified: Secondary | ICD-10-CM | POA: Insufficient documentation

## 2018-08-13 DIAGNOSIS — F411 Generalized anxiety disorder: Secondary | ICD-10-CM

## 2018-08-13 DIAGNOSIS — M25561 Pain in right knee: Secondary | ICD-10-CM

## 2018-08-13 DIAGNOSIS — Z79899 Other long term (current) drug therapy: Secondary | ICD-10-CM | POA: Insufficient documentation

## 2018-08-13 DIAGNOSIS — G4709 Other insomnia: Secondary | ICD-10-CM

## 2018-08-13 DIAGNOSIS — K222 Esophageal obstruction: Secondary | ICD-10-CM

## 2018-08-13 DIAGNOSIS — I10 Essential (primary) hypertension: Secondary | ICD-10-CM

## 2018-08-13 MED ORDER — BUSPIRONE HCL 15 MG PO TABS: 15.0000 mg | ORAL_TABLET | Freq: Two times a day (BID) | ORAL | 5 refills | Status: DC

## 2018-08-13 MED ORDER — PANTOPRAZOLE SODIUM 40 MG PO TBEC: 40.0000 mg | DELAYED_RELEASE_TABLET | Freq: Every day | ORAL | 5 refills | Status: DC

## 2018-08-13 MED ORDER — METHOCARBAMOL 500 MG PO TABS: 500.0000 mg | ORAL_TABLET | Freq: Four times a day (QID) | ORAL | 5 refills | Status: DC | PRN

## 2018-08-13 MED ORDER — MIRTAZAPINE 30 MG PO TABS: 15.0000 mg | ORAL_TABLET | Freq: Every day | ORAL | 5 refills | Status: DC

## 2018-08-13 MED ORDER — DILTIAZEM HCL ER COATED BEADS 180 MG PO CP24: 180.0000 mg | ORAL_CAPSULE | Freq: Every day | ORAL | 3 refills | Status: DC

## 2018-08-13 MED ORDER — NAPROXEN 500 MG PO TABS: 500.0000 mg | ORAL_TABLET | Freq: Two times a day (BID) | ORAL | 5 refills | Status: DC

## 2018-08-13 MED FILL — MIRTAZAPINE 30 MG TABLET: 30 | 30 days supply | Qty: 15 | Fill #0

## 2018-08-13 NOTE — Progress Notes (Signed)
Patient stated that she is currently not taking the all meds that she was prescribed due to financial reasons. Patients stated she is taking what she can afford. Patients complain of back pain per the patient was because she did not take the medication that helps her back due to her driving to the appointment today.

## 2018-08-13 NOTE — Progress Notes (Signed)
Kara Bailey, is a 62 y.o. female  LNL:[id number]  EYC:[id number]  DOB - 1956-09-22  Subjective:  Chief Complaint and HPI: Kara Bailey is a 62 y.o. female here today needing RF of meds.  Muscle relaxers work ok but wanting to know if she can take them more than twice daily.  Has been out of BP meds for several weeks.    ROS:   Constitutional:  No f/c, No night sweats, No unexplained weight loss. EENT:  No vision changes, No blurry vision, No hearing changes. No mouth, throat, or ear problems.  Respiratory: No cough, No SOB Cardiac: No CP, no palpitations GI:  No abd pain, No N/V/D. GU: No Urinary s/sx Musculoskeletal: back and knee pain/chronic Neuro: No headache, no dizziness, no motor weakness.  Skin: No rash Endocrine:  No polydipsia. No polyuria.  Psych: Denies SI/HI  No problems updated.  ALLERGIES: No Known Allergies  PAST MEDICAL HISTORY: Past Medical History:  Diagnosis Date  . Anxiety   . Chest pain 08/2014   unspecified  . Dizziness 08/2014  . Dyspnea 08/2014  . GERD (gastroesophageal reflux disease)   . Hepatitis C   . Hepatitis C virus infection   . Hypertension   . PAC (premature atrial contraction) 10/11/2014  . Palpitations   . PVC (premature ventricular contraction) 10/11/2014  . Shingles    left side  . 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 Tachycardia 08/2014  . Weakness 08/2014    MEDICATIONS AT HOME: Prior to Admission medications   Medication Sig Start Date End Date Taking? Authorizing Provider  mirtazapine (REMERON) 30 MG tablet Take 0.5 tablets (15 mg total) by mouth at bedtime. 08/13/18  Yes Freeman Caldron M, PA-C  naproxen (NAPROSYN) 500 MG tablet Take 1 tablet (500 mg total) by mouth 2 (two) times daily with a meal. 08/13/18  Yes Sheray Grist M, PA-C  pantoprazole (PROTONIX) 40 MG tablet Take 1 tablet (40 mg total) by mouth daily. 08/13/18  Yes Kaedon Fanelli, Dionne Bucy, PA-C  busPIRone (BUSPAR) 15 MG tablet Take 1  tablet (15 mg total) by mouth 2 (two) times daily. 08/13/18   Argentina Donovan, PA-C  diltiazem (CARDIZEM CD) 180 MG 24 hr capsule Take 1 capsule (180 mg total) by mouth daily. 08/13/18   Argentina Donovan, PA-C  guaiFENesin-dextromethorphan (ROBITUSSIN DM) 100-10 MG/5ML syrup Take 5 mLs by mouth every 4 (four) hours as needed for cough. Patient not taking: Reported on 08/13/2018 12/02/17   Charlott Rakes, MD  methocarbamol (ROBAXIN) 500 MG tablet Take 1 tablet (500 mg total) by mouth every 6 (six) hours as needed for muscle spasms. 08/13/18   Argentina Donovan, PA-C  varenicline (CHANTIX CONTINUING MONTH PAK) 1 MG tablet Take 1 tablet (1 mg total) by mouth 2 (two) times daily. Patient not taking: Reported on 08/13/2018 10/29/16   Boykin Nearing, MD     Objective:  EXAM:   Vitals:   08/13/18 0948  BP: (!) 148/88  Pulse: 83  Resp: 18  Temp: 98.2 F (36.8 C)  TempSrc: Oral  SpO2: 97%  Weight: 142 lb 3.2 oz (64.5 kg)  Height: 5\' 5"  (1.651 m)    General appearance : A&OX3. NAD. Non-toxic-appearing HEENT: Atraumatic and Normocephalic.  PERRLA. EOM intact.  Neck: supple, no JVD. No cervical lymphadenopathy. No thyromegaly Chest/Lungs:  Breathing-non-labored, Good air entry bilaterally, breath sounds normal without rales, rhonchi, or wheezing  CVS: S1 S2 regular, no murmurs, gallops, rubs  Extremities: Bilateral Lower Ext shows no edema, both  legs are warm to touch with = pulse throughout Neurology:  CN II-XII grossly intact, Non focal.   Psych:  TP linear. J/I WNL. Normal speech. Appropriate eye contact and affect.  Skin:  No Rash  Data Review No results found for: HGBA1C   Assessment & Plan   1. Other insomnia Controlled-continue current regimen - mirtazapine (REMERON) 30 MG tablet; Take 0.5 tablets (15 mg total) by mouth at bedtime.  Dispense: 30 tablet; Refill: 5  2. Essential hypertension Uncontrolled but out of meds-resume medications and check BP OOO. We have discussed  target BP range and blood pressure goal. I have advised patient to check BP regularly and to call us back or report to clinic if the numbers are consistently higher than 140/90. We discussed the importance of compliance with medical therapy and DASH diet recommended, consequences of uncontrolled hypertension discussed.  - diltiazem (CARDIZEM CD) 180 MG 24 hr capsule; Take 1 capsule (180 mg total) by mouth daily.  Dispense: 90 capsule; Refill: 3 - Comprehensive metabolic panel  3. Peptic stricture of esophagus - pantoprazole (PROTONIX) 40 MG tablet; Take 1 tablet (40 mg total) by mouth daily.  Dispense: 30 tablet; Refill: 5  4. Generalized anxiety disorder Controlled-stable-continue - busPIRone (BUSPAR) 15 MG tablet; Take 1 tablet (15 mg total) by mouth 2 (two) times daily.  Dispense: 60 tablet; Refill: 5  5. Back muscle spasm Ok to increase dose - methocarbamol (ROBAXIN) 500 MG tablet; Take 1 tablet (500 mg total) by mouth every 6 (six) hours as needed for muscle spasms.  Dispense: 90 tablet; Refill: 5 - naproxen (NAPROSYN) 500 MG tablet; Take 1 tablet (500 mg total) by mouth 2 (two) times daily with a meal.  Dispense: 60 tablet; Refill: 5  6. ongoing pain of right knee - naproxen (NAPROSYN) 500 MG tablet; Take 1 tablet (500 mg total) by mouth 2 (two) times daily with a meal.  Dispense: 60 tablet; Refill: 5   Patient have been counseled extensively about nutrition and exercise  Return in about 6 months (around 02/12/2019) for assign new PCP; f/up BP.  The patient was given clear instructions to go to ER or return to medical center if symptoms don't improve, worsen or new problems develop. The patient verbalized understanding. The patient was told to call to get lab results if they haven't heard anything in the next week.     Freeman Caldron, PA-C [facility] and [facility] [place], [place]   08/13/2018, 10:24 AM

## 2018-08-14 LAB — COMPREHENSIVE METABOLIC PANEL
ALK PHOS: 112 IU/L (ref 39–117)
ALT: 17 IU/L (ref 0–32)
AST: 21 IU/L (ref 0–40)
Albumin/Globulin Ratio: 1.5 (ref 1.2–2.2)
Albumin: 4.3 g/dL (ref 3.6–4.8)
BUN/Creatinine Ratio: 6 — ABNORMAL LOW (ref 12–28)
BUN: 5 mg/dL — AB (ref 8–27)
Bilirubin Total: 0.4 mg/dL (ref 0.0–1.2)
CO2: 23 mmol/L (ref 20–29)
CREATININE: 0.87 mg/dL (ref 0.57–1.00)
Calcium: 9.6 mg/dL (ref 8.7–10.3)
Chloride: 102 mmol/L (ref 96–106)
GFR calc Af Amer: 81 mL/min/{1.73_m2} (ref >59)
GFR calc non Af Amer: 71 mL/min/{1.73_m2} (ref >59)
GLUCOSE: 68 mg/dL (ref 65–99)
Globulin, Total: 2.8 g/dL (ref 1.5–4.5)
Potassium: 4.7 mmol/L (ref 3.5–5.2)
Sodium: 139 mmol/L (ref 134–144)
Total Protein: 7.1 g/dL (ref 6.0–8.5)

## 2018-08-15 ENCOUNTER — Telehealth: Payer: Self-pay

## 2018-08-15 NOTE — Telephone Encounter (Signed)
-----   Message from Argentina Donovan, [place] sent at 08/14/2018 12:57 PM EDT ----- Please call patient.  Labs normal.  Follow-up as planned.  Thanks, Freeman Caldron, PA-C

## 2018-08-20 ENCOUNTER — Ambulatory Visit: Payer: Self-pay | Admitting: Family Medicine

## 2018-09-03 MED FILL — busPIRone HCL 15 MG TABS: 15 | 30 days supply | Qty: 60 | Fill #2

## 2018-09-10 MED FILL — MIRTAZAPINE 30 MG TABLET: 30 | 30 days supply | Qty: 15 | Fill #1

## 2018-09-10 MED FILL — PANTOPRAZOLE SOD DR 40 MG T: 40 | 30 days supply | Qty: 30 | Fill #0

## 2018-10-07 MED FILL — MIRTAZAPINE 30 MG TABLET: 30 | 30 days supply | Qty: 15 | Fill #2

## 2018-10-31 MED FILL — busPIRone HCL 15 MG TABS: 15 | 30 days supply | Qty: 60 | Fill #3

## 2018-10-31 MED FILL — MIRTAZAPINE 30 MG TABLET: 30 | 30 days supply | Qty: 15 | Fill #3

## 2018-11-17 MED FILL — DILTIAZEM 24HR ER 180 MG CA: 180 | 30 days supply | Qty: 30 | Fill #1

## 2018-11-17 MED FILL — PANTOPRAZOLE SOD DR 40 MG T: 40 | 30 days supply | Qty: 30 | Fill #1

## 2018-12-04 MED FILL — MIRTAZAPINE 30 MG TABLET: 30 | 30 days supply | Qty: 15 | Fill #4

## 2018-12-22 MED FILL — busPIRone HCL 15 MG TABS: 15 | 30 days supply | Qty: 60 | Fill #0

## 2018-12-22 MED FILL — PANTOPRAZOLE SOD DR 40 MG T: 40 | 30 days supply | Qty: 30 | Fill #2

## 2018-12-31 MED FILL — MIRTAZAPINE 30 MG TABLET: 30 | 30 days supply | Qty: 15 | Fill #5

## 2019-02-03 ENCOUNTER — Other Ambulatory Visit: Payer: Self-pay | Admitting: Family Medicine

## 2019-02-03 DIAGNOSIS — I10 Essential (primary) hypertension: Secondary | ICD-10-CM

## 2019-02-03 MED FILL — busPIRone HCL 15 MG TABS: 15 | 30 days supply | Qty: 60 | Fill #1

## 2019-02-03 MED FILL — MIRTAZAPINE 30 MG TABLET: 30 | 30 days supply | Qty: 15 | Fill #6

## 2019-02-04 MED FILL — busPIRone HCL 15 MG TABS: 15 | 30 days supply | Qty: 60 | Fill #2

## 2019-02-04 MED FILL — MIRTAZAPINE 30 MG TABLET: 30 | 30 days supply | Qty: 15 | Fill #7

## 2019-02-04 MED FILL — DILTIAZEM 24HR ER 180 MG CA: 180 | 90 days supply | Qty: 90 | Fill #0

## 2019-02-27 MED FILL — PANTOPRAZOLE SOD DR 40 MG T: 40 | 30 days supply | Qty: 30 | Fill #3

## 2019-03-27 MED FILL — MIRTAZAPINE 30 MG TABLET: 30 | 30 days supply | Qty: 15 | Fill #8

## 2019-04-27 MED FILL — MIRTAZAPINE 30 MG TABLET: 30 | 30 days supply | Qty: 15 | Fill #9

## 2019-07-09 MED FILL — busPIRone HCL 15 MG TABS: 15 | 30 days supply | Qty: 60 | Fill #3

## 2019-07-27 MED FILL — MIRTAZAPINE 30 MG TABLET: 30 | 30 days supply | Qty: 15 | Fill #10

## 2020-05-26 ENCOUNTER — Encounter: Payer: Self-pay | Admitting: Gastroenterology

## 2020-06-29 ENCOUNTER — Encounter: Payer: Self-pay | Admitting: Gastroenterology

## 2020-06-29 ENCOUNTER — Ambulatory Visit (INDEPENDENT_AMBULATORY_CARE_PROVIDER_SITE_OTHER): Payer: Medicare HMO | Admitting: Gastroenterology

## 2020-06-29 VITALS — BP 142/90 | HR 106 | Ht 65.0 in | Wt 145.4 lb

## 2020-06-29 DIAGNOSIS — R1310 Dysphagia, unspecified: Secondary | ICD-10-CM | POA: Diagnosis not present

## 2020-06-29 DIAGNOSIS — Z8719 Personal history of other diseases of the digestive system: Secondary | ICD-10-CM

## 2020-06-29 NOTE — Progress Notes (Signed)
06/29/2020 Kara Bailey [id number] 01/09/1956   HISTORY OF PRESENT ILLNESS: This is a 64 year old female who is new to our office.  She is previously known to Dr. Gala Romney.  She was last seen there in 2017.  She is here today with complaints of heartburn/reflux and dysphagia.  She says that for the past 3 to 4 months she has been experiencing issues with swallowing again.  She says that almost every time she eats the food gets stuck and comes back up.  She is on pantoprazole 40 mg daily.  She says that she is lost about 6 to 10 pounds due to not being able to keep food down well.  She says that even drinking liquids comes back up a lot at times.  She had EGD with dilation with Dr. Gala Romney in 2017 with results as follows:  - LA Grade A esophagitis. - Benign-appearing esophageal stenosis. Dilated. - The examination was otherwise normal. - Medium-sized hiatal hernia. - The examination was otherwise normal. - Normal second portion of the duodenum. - No specimens collected.  She says that following that she felt great and her dysphagia had resolved until about 3-4 months ago.  This feels exactly the same as the symptoms that she had previously.   Past Medical History:  Diagnosis Date  . Anxiety   . Chest pain 08/2014   unspecified  . Cirrhosis ([place])   . Depression   . Dizziness 08/2014  . Dyspnea 08/2014  . Emphysema lung ([place])   . GERD (gastroesophageal reflux disease)   . Hepatitis C   . Hepatitis C virus infection   . Hypertension   . PAC (premature atrial contraction) 10/11/2014  . PAD (peripheral artery disease) ([place])   . Palpitations   . PVC (premature ventricular contraction) 10/11/2014  . Shingles    left side  . 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 Tachycardia 08/2014  . Ventral hernia   . Weakness 08/2014   Past Surgical History:  Procedure Laterality Date  . APPENDECTOMY    . COLONOSCOPY WITH PROPOFOL N/A 04/30/2016   Procedure: COLONOSCOPY WITH  PROPOFOL;  Surgeon: Daneil Dolin, MD;  [facility];  Service: Endoscopy;  Laterality: N/A;  1230  . ESOPHAGOGASTRODUODENOSCOPY     approximately 2010  . ESOPHAGOGASTRODUODENOSCOPY (EGD) WITH PROPOFOL N/A 04/30/2016   Procedure: ESOPHAGOGASTRODUODENOSCOPY (EGD) WITH PROPOFOL;  Surgeon: Daneil Dolin, MD;  [facility]: [facility];  Service: Endoscopy;  Laterality: N/A;  . LEFT HEART CATHETERIZATION WITH CORONARY ANGIOGRAM N/A 10/29/2014   Procedure: LEFT HEART CATHETERIZATION WITH CORONARY ANGIOGRAM;  Surgeon: Burnell Blanks, MD;  Location: [facility] CATH LAB;  Service: Cardiovascular;  Laterality: N/A;  . Venia Minks DILATION N/A 04/30/2016   Procedure: Venia Minks DILATION;  Surgeon: Daneil Dolin, MD;  Location: [facility];  Service: Endoscopy;  Laterality: N/A;  . POLYPECTOMY  04/30/2016   Procedure: POLYPECTOMY;  Surgeon: Daneil Dolin, MD;  Location: [facility];  Service: Endoscopy;;  Sigmoid colon polyp removed via hot snare    reports that she has been smoking cigarettes. She started smoking about 51 years ago. She has been smoking about 0.50 packs per day. She has never used smokeless tobacco. She reports current alcohol use. She reports that she does not use drugs. family history includes Alzheimer's disease in her father; CVA in her father; Heart attack in her mother; Heart disease in her mother; Heart failure in her mother; Hyperlipidemia in her mother; Hypertension in her  mother. No Known Allergies    Outpatient Encounter Medications as of 06/29/2020  Medication Sig  . busPIRone (BUSPAR) 15 MG tablet Take 1 tablet (15 mg total) by mouth 2 (two) times daily.  Marland Kitchen diltiazem (CARDIZEM CD) 180 MG 24 hr capsule TAKE 1 CAPSULE BY MOUTH DAILY.  . methocarbamol (ROBAXIN) 500 MG tablet Take 1 tablet (500 mg total) by mouth every 6 (six) hours as needed for muscle spasms.  . mirtazapine (REMERON) 30 MG tablet Take 0.5 tablets (15 mg total) by mouth at bedtime.  . naproxen (NAPROSYN)  500 MG tablet Take 1 tablet (500 mg total) by mouth 2 (two) times daily with a meal.  . pantoprazole (PROTONIX) 40 MG tablet Take 1 tablet (40 mg total) by mouth daily.  . varenicline (CHANTIX CONTINUING MONTH PAK) 1 MG tablet Take 1 tablet (1 mg total) by mouth 2 (two) times daily.  . [DISCONTINUED] guaiFENesin-dextromethorphan (ROBITUSSIN DM) 100-10 MG/5ML syrup Take 5 mLs by mouth every 4 (four) hours as needed for cough. (Patient not taking: Reported on 08/13/2018)   No facility-administered encounter medications on file as of 06/29/2020.    REVIEW OF SYSTEMS  : All other systems reviewed and negative except where noted in the History of Present Illness.  PHYSICAL EXAM: BP (!) 142/90   Pulse (!) 106   Ht 5\' 5"  (1.651 m)   Wt 145 lb 6.4 oz (66 kg)   SpO2 98%   BMI 24.20 kg/m  General: Well developed AA female in no acute distress Head: Normocephalic and atraumatic Eyes:  Sclerae anicteric, conjunctiva pink. Ears: Normal auditory acuity Lungs: Clear throughout to auscultation; no increased WOB. Heart: Slightly tachy but regular rhythm; no M/R/G. Abdomen: Soft, non-distended.  BS present.  Non-tender. Musculoskeletal: Symmetrical with no gross deformities  Skin: No lesions on visible extremities Extremities: No edema  Neurological: Alert oriented x 4, grossly non-focal Psychological:  Alert and cooperative. Normal mood and affect  ASSESSMENT AND PLAN: *Dysphagia and history of esophageal stricture: Status post dilation in 2017 with great results.  Now with similar symptoms over the past 3 to 4 months.  Dysphagia to solid food most every time she eats.  We will plan for EGD with possible dilation with Dr. Loletha Carrow.  The risks, benefits, and alternatives to EGD with dilation were discussed with the patient and she consents to proceed.   CC:  Charlott Rakes, MD

## 2020-06-29 NOTE — Patient Instructions (Signed)
If you are age 66 or older, your body mass index should be between 23-30. Your Body mass index is 24.2 kg/m. If this is out of the aforementioned range listed, please consider follow up with your Primary Care Provider.  If you are age 74 or younger, your body mass index should be between 19-25. Your Body mass index is 24.2 kg/m. If this is out of the aformentioned range listed, please consider follow up with your Primary Care Provider.     You have been scheduled for an endoscopy. Please follow written instructions given to you at your visit today. If you use inhalers (even only as needed), please bring them with you on the day of your procedure.

## 2020-07-08 DIAGNOSIS — Z8719 Personal history of other diseases of the digestive system: Secondary | ICD-10-CM | POA: Insufficient documentation

## 2020-07-13 NOTE — Progress Notes (Signed)
____________________________________________________________  Attending physician addendum:  Thank you for sending this case to me. I have reviewed the entire note, and the outlined plan seems appropriate.  Amerie Beaumont Danis, MD  ____________________________________________________________  

## 2020-07-14 ENCOUNTER — Encounter: Payer: Self-pay | Admitting: Gastroenterology

## 2020-07-14 ENCOUNTER — Other Ambulatory Visit: Payer: Self-pay

## 2020-07-14 ENCOUNTER — Ambulatory Visit (AMBULATORY_SURGERY_CENTER): Payer: Medicare HMO | Admitting: Gastroenterology

## 2020-07-14 VITALS — BP 148/75 | HR 87 | Temp 96.0°F | Resp 13 | Ht 65.0 in | Wt 145.6 lb

## 2020-07-14 DIAGNOSIS — R1310 Dysphagia, unspecified: Secondary | ICD-10-CM

## 2020-07-14 DIAGNOSIS — K2970 Gastritis, unspecified, without bleeding: Secondary | ICD-10-CM | POA: Diagnosis not present

## 2020-07-14 DIAGNOSIS — K2950 Unspecified chronic gastritis without bleeding: Secondary | ICD-10-CM

## 2020-07-14 DIAGNOSIS — Z8719 Personal history of other diseases of the digestive system: Secondary | ICD-10-CM

## 2020-07-14 DIAGNOSIS — K222 Esophageal obstruction: Secondary | ICD-10-CM

## 2020-07-14 DIAGNOSIS — K449 Diaphragmatic hernia without obstruction or gangrene: Secondary | ICD-10-CM

## 2020-07-14 MED ORDER — SODIUM CHLORIDE 0.9 % IV SOLN: 500.0000 mL | Freq: Once | INTRAVENOUS | Status: DC

## 2020-07-14 NOTE — Op Note (Signed)
Kara Bailey Procedure Date: 07/14/2020 9:42 AM MRN: [id number] Endoscopist: Mallie Mussel L. Loletha Carrow , MD Age: 64 Referring MD:  Date of Birth: 1956/01/17 Gender: Female Account #: [id number] Procedure:                Upper GI endoscopy Indications:              Esophageal dysphagia (Hx of esopageal stricture                            requiring dilation 2017) Medicines:                Monitored Anesthesia Care Procedure:                Pre-Anesthesia Assessment:                           - Prior to the procedure, a History and Physical                            was performed, and patient medications and                            allergies were reviewed. The patient's tolerance of                            previous anesthesia was also reviewed. The risks                            and benefits of the procedure and the sedation                            options and risks were discussed with the patient.                            All questions were answered, and informed consent                            was obtained. Prior Anticoagulants: The patient has                            taken no previous anticoagulant or antiplatelet                            agents. ASA Grade Assessment: III - A patient with                            severe systemic disease. After reviewing the risks                            and benefits, the patient was deemed in                            satisfactory condition to undergo the procedure.  After obtaining informed consent, the endoscope was                            passed under direct vision. Throughout the                            procedure, the patient's blood pressure, pulse, and                            oxygen saturations were monitored continuously. The                            Endoscope was introduced through the mouth, and                            advanced to the second part of  duodenum. The upper                            GI endoscopy was accomplished without difficulty.                            The patient tolerated the procedure well. Scope In: Scope Out: Findings:                 One benign-appearing, intrinsic moderate stenosis                            was found at the gastroesophageal junction. This                            stenosis measured 9 mm (inner diameter) x less than                            one cm (in length). The stenosis was traversed with                            mild scope pressure. A TTS dilator was passed                            through the scope. Dilation with a 13.5-14.5-15.5                            mm balloon dilator was performed to 15.5 mm. The                            dilation site was examined and showed moderate                            mucosal disruption and moderate improvement in                            luminal narrowing. This was biopsied with a cold  forceps for partial resection of ring.                           A small sliding hiatal hernia was present.                           Diffuse mildly congested mucosa was found in the                            gastric fundus and in the gastric body. Biopsies                            were taken with a cold forceps for histology.                            ([place] protocol).                           A benign-appearing, intrinsic mild stenosis was                            found at the pylorus. This was traversed with mild                            scope pressure - caused self-limited oozing.                           The exam of the stomach was otherwise normal.                           The cardia and gastric fundus were normal on                            retroflexion.                           The examined duodenum was normal. Complications:            No immediate complications. Estimated Blood Loss:     Estimated blood loss  was minimal. Impression:               - Benign-appearing esophageal stenosis. Dilated.                            Biopsied.                           - Small hiatal hernia.                           - Congestive gastropathy. Biopsied.                           - Gastric stenosis was found at the pylorus.                           - Normal examined duodenum.  Recommendation:           - Patient has a contact number available for                            emergencies. The signs and symptoms of potential                            delayed complications were discussed with the                            patient. Return to normal activities tomorrow.                            Written discharge instructions were provided to the                            patient.                           - Liquid diet for 2 hours, then soft diet for 6                            hours, then resume previous diet.                           - Continue present medications.                           - Await pathology results. [name] L. Loletha Carrow, MD 07/14/2020 10:20:11 AM This report has been signed electronically.

## 2020-07-14 NOTE — Progress Notes (Signed)
VS- Bethann Berkshire RN

## 2020-07-14 NOTE — Progress Notes (Signed)
To PACU, VSS. Report to [email or web address] 

## 2020-07-14 NOTE — Patient Instructions (Addendum)
Handouts were given to you on Gastritis, Esophageal stricture and the Dilatation Diet to follow the rest of the day. You may resume your current medications today. Await biopsy results. Please call if any questions or concerns.     YOU HAD AN ENDOSCOPIC PROCEDURE TODAY AT [facility]:   Refer to the procedure report that was given to you for any specific questions about what was found during the examination.  If the procedure report does not answer your questions, please call your gastroenterologist to clarify.  If you requested that your care partner not be given the details of your procedure findings, then the procedure report has been included in a sealed envelope for you to review at your convenience later.  YOU SHOULD EXPECT: Some feelings of bloating in the abdomen. Passage of more gas than usual.  Walking can help get rid of the air that was put into your GI tract during the procedure and reduce the bloating. If you had a lower endoscopy (such as a colonoscopy or flexible sigmoidoscopy) you may notice spotting of blood in your stool or on the toilet paper. If you underwent a bowel prep for your procedure, you may not have a normal bowel movement for a few days.  Please Note:  You might notice some irritation and congestion in your nose or some drainage.  This is from the oxygen used during your procedure.  There is no need for concern and it should clear up in a day or so.  SYMPTOMS TO REPORT IMMEDIATELY:    Following upper endoscopy (EGD)  Vomiting of blood or coffee ground material  New chest pain or pain under the shoulder blades  Painful or persistently difficult swallowing  New shortness of breath  Fever of 100F or higher  Black, tarry-looking stools  For urgent or emergent issues, a gastroenterologist can be reached at any hour by calling [phone]. Do not use MyChart messaging for urgent concerns.    DIET: Nothing to eat or drink for 1 hour.  At  11:15 am to 1:15 pm for two hours stay on Clear Liquid Diet.  At 1:15 pm to 7:15 pm you may advance to a soft diet for 6 hours.  After 7:15 pm if swallowing okay, you may resume your previous diet.  Drink plenty of fluids but you should avoid alcoholic beverages for 24 hours.  ACTIVITY:  You should plan to take it easy for the rest of today and you should NOT DRIVE or use heavy machinery until tomorrow (because of the sedation medicines used during the test).    FOLLOW UP: Our staff will call the number listed on your records 48-72 hours following your procedure to check on you and address any questions or concerns that you may have regarding the information given to you following your procedure. If we do not reach you, we will leave a message.  We will attempt to reach you two times.  During this call, we will ask if you have developed any symptoms of COVID 19. If you develop any symptoms (ie: fever, flu-like symptoms, shortness of breath, cough etc.) before then, please call [phone].  If you test positive for Covid 19 in the 2 weeks post procedure, please call and report this information to [place].    If any biopsies were taken you will be contacted by phone or by letter within the next 1-3 weeks.  Please call us at [phone] if you have not heard about the biopsies  in 3 weeks.    SIGNATURES/CONFIDENTIALITY: You and/or your care partner have signed paperwork which will be entered into your electronic medical record.  These signatures attest to the fact that that the information above on your After Visit Summary has been reviewed and is understood.  Full responsibility of the confidentiality of this discharge information lies with you and/or your care-partner.

## 2020-07-14 NOTE — Progress Notes (Signed)
No problems noted in the recovery room. Maw  I went over the dilatation diet with pt 3 times, to insure she understood how to do it.  Pt states, "I understand". I also went over the dilatation diet with Thayer Jew, pt's care partner.  Maw

## 2020-07-19 ENCOUNTER — Telehealth: Payer: Self-pay | Admitting: *Deleted

## 2020-07-19 NOTE — Telephone Encounter (Signed)
  Follow up Call-  Call back number 07/14/2020  Post procedure Call Back phone  # [phone]  Permission to leave phone message Yes  Some recent data might be hidden     Patient questions:  Do you have a fever, pain , or abdominal swelling? No. Pain Score  0 *  Have you tolerated food without any problems? Yes.    Have you been able to return to your normal activities? Yes.    Do you have any questions about your discharge instructions: Diet   No. Medications  No. Follow up visit  No.  Do you have questions or concerns about your Care? No.  Actions: * If pain score is 4 or above: No action needed, pain <4.  1. Have you developed a fever since your procedure? no  2.   Have you had an respiratory symptoms (SOB or cough) since your procedure? no  3.   Have you tested positive for COVID 19 since your procedure no  4.   Have you had any family members/close contacts diagnosed with the COVID 19 since your procedure?  no   If yes to any of these questions please route to Joylene [name], RN and Joella Prince, RN

## 2020-07-21 ENCOUNTER — Encounter: Payer: Self-pay | Admitting: Gastroenterology

## 2020-10-13 NOTE — Progress Notes (Signed)
Cardiology Office Note:   Date:  10/14/2020  NAME:  Kara Bailey    MRN: [id number] DOB:  [birth year]   PCP:  Andree Moro, DO  Cardiologist:  No primary care provider on file.   eferring MD: Arthur Holms, NP   Chief Complaint  Patient presents with  . New Patient (Initial Visit)  . Arm Pain    Left arm for about 15 minutes.   History of Present Illness:   [name] is a 64 y.o. female with a hx of HTN, esophageal stricture, arthritis who is being seen today for the evaluation of murmur at the request of Arthur Holms, NP. Left heart cath normal and echo normal in 2015.  She reports she was told by her doctor she had a murmur.  Echocardiogram in 2015 was unremarkable.  She denies any major symptoms of chest pain.  She does get short of breath when she exerts herself.  She used to exercise but does not do this anymore due to shortness of breath.  She does have a 40-pack-year smoking history.  Not interested in quitting at this time.  Her most recent lipid profile shows a HDL of 39, LDL 95, total cholesterol 156, triglycerides 111.  This was done in 2018.  No recent values that I can see.  She is nondiabetic.  She does have some left lower leg pain.  Reports when she walks her leg gives out.  She has poor pulses on examination.  She does need ABIs.  I suspect you have PAD given her smoking history and symptoms.  No other major symptoms.  She had a left heart cath as mentioned above in 2015 that was normal.  Echo was also normal.  Blood pressure is 160/90 today.  She reports she just took her medication.  She is on diltiazem.  She reports at home her blood pressure ranges between 120 and 150.  I did inform her that we will need to get a better log and see what her blood pressure is running.  Her EKG is evident that her blood pressure is likely higher.  Problem List 1. HTN 2. GERD/stricture 3. Tobacco abuse  -40 pack year history managed  Past Medical History: Past Medical History:    Diagnosis Date  . Anxiety   . Chest pain 08/2014   unspecified  . Cirrhosis ([place])   . Depression   . Dizziness 08/2014  . Dyspnea 08/2014  . Emphysema lung ([place])   . GERD (gastroesophageal reflux disease)   . Hepatitis C   . Hepatitis C virus infection   . Hypertension   . PAC (premature atrial contraction) 10/11/2014  . PAD (peripheral artery disease) ([place])   . Palpitations   . PVC (premature ventricular contraction) 10/11/2014  . Shingles    left side  . 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 Tachycardia 08/2014  . Ventral hernia   . Weakness 08/2014    Past Surgical History: Past Surgical History:  Procedure Laterality Date  . APPENDECTOMY    . CATARACT EXTRACTION, BILATERAL    . COLONOSCOPY    . COLONOSCOPY WITH PROPOFOL N/A 04/30/2016   Procedure: COLONOSCOPY WITH PROPOFOL;  Surgeon: Daneil Dolin, MD;  [facility];  Service: Endoscopy;  Laterality: N/A;  1230  . ESOPHAGOGASTRODUODENOSCOPY     approximately 2010  . ESOPHAGOGASTRODUODENOSCOPY (EGD) WITH PROPOFOL N/A 04/30/2016   Procedure: ESOPHAGOGASTRODUODENOSCOPY (EGD) WITH PROPOFOL;  Surgeon: Daneil Dolin, MD;  [facility];  Service: Endoscopy;  Laterality: N/A;  . LEFT HEART CATHETERIZATION WITH CORONARY ANGIOGRAM N/A 10/29/2014   07-14-20- pt denies this Procedure: LEFT HEART CATHETERIZATION WITH CORONARY ANGIOGRAM;  Surgeon: Burnell Blanks, MD;  Location: [facility] CATH LAB;  Service: Cardiovascular;  Laterality: N/A;  . Venia Minks DILATION N/A 04/30/2016   Procedure: Venia Minks DILATION;  Surgeon: Daneil Dolin, MD;  [facility];  Service: Endoscopy;  Laterality: N/A;  . POLYPECTOMY  04/30/2016   Procedure: POLYPECTOMY;  Surgeon: Daneil Dolin, MD;  [facility];  Service: Endoscopy;;  Sigmoid colon polyp removed via hot snare  . UPPER GASTROINTESTINAL ENDOSCOPY      Current Medications: Current Meds  Medication Sig  . busPIRone (BUSPAR) 15 MG tablet  Take 1 tablet (15 mg total) by mouth 2 (two) times daily.  Marland Kitchen diltiazem (CARDIZEM CD) 180 MG 24 hr capsule TAKE 1 CAPSULE BY MOUTH DAILY.  . methocarbamol (ROBAXIN) 500 MG tablet Take 1 tablet (500 mg total) by mouth every 6 (six) hours as needed for muscle spasms.  . mirtazapine (REMERON) 30 MG tablet Take 0.5 tablets (15 mg total) by mouth at bedtime.  . naproxen (NAPROSYN) 500 MG tablet Take 1 tablet (500 mg total) by mouth 2 (two) times daily with a meal.  . pantoprazole (PROTONIX) 40 MG tablet Take 1 tablet (40 mg total) by mouth daily.  . [DISCONTINUED] varenicline (CHANTIX CONTINUING MONTH PAK) 1 MG tablet Take 1 tablet (1 mg total) by mouth 2 (two) times daily.     Allergies:    Patient has no known allergies.   Social History: Social History   Socioeconomic History  . Marital status: Divorced    Spouse name: Not on file  . Number of children: 2  . Years of education: Not on file  . Highest education level: Not on file  Occupational History  . Occupation: retired  Tobacco Use  . Smoking status: Current Every Day Smoker    Packs/day: 0.50    Years: 40.00    Pack years: 20.00    Types: Cigarettes    Start date: 11/12/1968  . Smokeless tobacco: Never Used  . Tobacco comment: cutting back, wearing patches  Vaping Use  . Vaping Use: Never used  Substance and Sexual Activity  . Alcohol use: Not Currently    Alcohol/week: 0.0 standard drinks    Comment: beer on weekends (6-pack)  . Drug use: No  . Sexual activity: Not Currently  Other Topics Concern  . Not on file  Social History Narrative  . Not on file   Social Determinants of Health   Financial Resource Strain:   . Difficulty of Paying Living Expenses: Not on file  Food Insecurity:   . Worried About Charity fundraiser in the Last Year: Not on file  . Ran Out of Food in the Last Year: Not on file  Transportation Needs:   . Lack of Transportation (Medical): Not on file  . Lack of Transportation (Non-Medical): Not  on file  Physical Activity:   . Days of Exercise per Week: Not on file  . Minutes of Exercise per Session: Not on file  Stress:   . Feeling of Stress : Not on file  Social Connections:   . Frequency of Communication with Friends and Family: Not on file  . Frequency of Social Gatherings with Friends and Family: Not on file  . Attends Religious Services: Not on file  . Active Member of Clubs or Organizations: Not on  file  . Attends Archivist Meetings: Not on file  . Marital Status: Not on file     Family History: The patient's family history includes Alzheimer's disease in her father; CVA in her father; Heart attack in her mother; Heart disease in her mother; Heart failure in her mother; Hyperlipidemia in her mother; Hypertension in her mother. There is no history of Cancer, Colon cancer, Esophageal cancer, Rectal cancer, or Stomach cancer.  ROS:   All other ROS reviewed and negative. Pertinent positives noted in the HPI.     EKGs/Labs/Other Studies Reviewed:   The following studies were personally reviewed by me today:  EKG:  EKG is ordered today.  The ekg ordered today demonstrates sinus tachycardia, heart rate 102, biatrial enlargement noted, nonspecific ST-T changes noted likely related to hypertension, and was personally reviewed by me.   TTE 10/2014 - Left ventricle: The cavity size was normal. There was moderate  concentric hypertrophy. Systolic function was vigorous. The  estimated ejection fraction was in the range of 65% to 70%. Wall  motion was normal; there were no regional wall motion  abnormalities. Doppler parameters are consistent with abnormal  left ventricular relaxation (grade 1 diastolic dysfunction).  Doppler parameters are consistent with high ventricular filling  pressure.  - Aortic valve: There was no regurgitation.  - Aortic root: The aortic root was normal in size.  - Mitral valve: Structurally normal valve.  - Right ventricle:  Systolic function was normal.  - Right atrium: The atrium was normal in size.  - Tricuspid valve: There was mild regurgitation.  - Pulmonic valve: There was trivial regurgitation.  - Pulmonary arteries: Systolic pressure was within the normal  range.  - Inferior vena cava: The vessel was normal in size.  - Pericardium, extracardiac: There was no pericardial effusion.   Left Heart Cath 2015 -Normal coronary arteries    Recent Labs: No results found for requested labs within last 8760 hours.   Recent Lipid Panel    Component Value Date/Time   CHOL 156 08/14/2017 1610   TRIG 111 08/14/2017 1610   HDL 39 (L) 08/14/2017 1610   CHOLHDL 4.0 08/14/2017 1610   LDLCALC 95 08/14/2017 1610    Physical Exam:   VS:  BP (!) 160/90 (BP Location: Left Arm, Patient Position: Sitting, Cuff Size: Normal)   Pulse (!) 102   Ht 5\' 5"  (1.651 m)   Wt 149 lb (67.6 kg)   BMI 24.79 kg/m    Wt Readings from Last 3 Encounters:  10/14/20 149 lb (67.6 kg)  07/14/20 145 lb 9.6 oz (66 kg)  06/29/20 145 lb 6.4 oz (66 kg)    General: Well nourished, well developed, in no acute distress Heart: Atraumatic, normal size  Eyes: PEERLA, EOMI  Neck: Supple, no JVD Endocrine: No thryomegaly Cardiac: Normal S1, S2; faint 2 out of 6 systolic ejection murmur Lungs: Clear to auscultation bilaterally, no wheezing, rhonchi or rales  Abd: Soft, nontender, no hepatomegaly  Ext: Absent pulses bilaterally in the lower extremities Musculoskeletal: No deformities, BUE and BLE strength normal and equal Skin: Warm and dry, no rashes   Neuro: Alert and oriented to person, place, time, and situation, CNII-XII grossly intact, no focal deficits  Psych: Normal mood and affect   ASSESSMENT:   MAISLEY HAINSWORTH is a 64 y.o. female who presents for the following: 1. Murmur   2. Primary hypertension   3. Tobacco abuse   4. Pain of left lower extremity  PLAN:   1. Murmur -Faint murmur on exam.  We will repeat her  echocardiogram.  I suspect this is a flow murmur.  2. Primary hypertension -Blood pressure 160/90.  She was given information about salt reductive strategies.  She reports she just took her blood pressure medication and it is a bit high because of this.  I would like for her to check her log at home.  She may need other medications.  3. Tobacco abuse 4. Pain of left lower extremity -She has an extensive smoking history.  Nearly 40 pack years.  She has absent palpable pulses on examination.  She does describe left leg pain with exertion.  I would like for her to obtain ABIs as well as arterial duplexes of lower extremities.  I suspect she will have PAD.  Disposition: Return in about 3 months (around 01/12/2021).  Medication Adjustments/Labs and Tests Ordered: Current medicines are reviewed at length with the patient today.  Concerns regarding medicines are outlined above.  Orders Placed This Encounter  Procedures  . EKG 12-Lead  . ECHOCARDIOGRAM COMPLETE  . VAS [place] LOWER EXTREMITY ARTERIAL DUPLEX  . VAS [place] ABI WITH/WO TBI   No orders of the defined types were placed in this encounter.   Patient Instructions  Medication Instructions:  Your physician recommends that you continue on your current medications as directed. Please refer to the Current Medication list given to you today.  *If you need a refill on your cardiac medications before your next appointment, please call your pharmacy*   Lab Work: -None If you have labs (blood work) drawn today and your tests are completely normal, you will receive your results only by: Marland Kitchen MyChart Message (if you have MyChart) OR . A paper copy in the mail If you have any lab test that is abnormal or we need to change your treatment, we will call you to review the results.   Testing/Procedures:  Your physician has requested that you have an echocardiogram for [place].  Echocardiography is a painless test that uses sound waves to create images of  your heart. It provides your doctor with information about the size and shape of your heart and how well your heart's chambers and valves are working. This procedure takes approximately one hour. There are no restrictions for this procedure.   Your physician has requested that you have a lower or upper extremity arterial duplex. Bilateral for Left Leg Pain with ABI. This test is an ultrasound of the arteries in the legs or arms. It looks at arterial blood flow in the legs and arms. Allow one hour for Lower and Upper Arterial scans. There are no restrictions or special instructions  Your physician has requested that you have an ankle brachial index (ABI). During this test an ultrasound and blood pressure cuff are used to evaluate the arteries that supply the arms and legs with blood. Allow thirty minutes for this exam. There are no restrictions or special instructions.    Follow-Up: At [facility], you and your health needs are our priority.  As part of our continuing mission to provide you with exceptional heart care, we have created designated Provider Care Teams.  These Care Teams include your primary Cardiologist (physician) and Advanced Practice Providers (APPs -  Physician Assistants and Nurse Practitioners) who all work together to provide you with the care you need, when you need it.  We recommend signing up for the patient portal called "MyChart".  Sign up information is provided on  this After Visit Summary.  MyChart is used to connect with patients for Virtual Visits (Telemedicine).  Patients are able to view lab/test results, encounter notes, upcoming appointments, etc.  Non-urgent messages can be sent to your provider as well.   To learn more about what you can do with MyChart, go to [email or web address].    Your next appointment:   3 month(s)  The format for your next appointment:   In Person  Provider:   Eleonore Chiquito, MD   Other Instructions -None     Signed, Addison Naegeli. Audie Box, [place]  [address] [place], [place] [address] [phone]  10/14/2020 9:38 AM

## 2020-10-14 ENCOUNTER — Encounter: Payer: Self-pay | Admitting: Cardiovascular Disease

## 2020-10-14 ENCOUNTER — Other Ambulatory Visit: Payer: Self-pay

## 2020-10-14 ENCOUNTER — Ambulatory Visit (INDEPENDENT_AMBULATORY_CARE_PROVIDER_SITE_OTHER): Payer: Medicare HMO | Admitting: Cardiovascular Disease

## 2020-10-14 VITALS — BP 160/90 | HR 102 | Ht 65.0 in | Wt 149.0 lb

## 2020-10-14 DIAGNOSIS — I10 Essential (primary) hypertension: Secondary | ICD-10-CM | POA: Diagnosis not present

## 2020-10-14 DIAGNOSIS — M79605 Pain in left leg: Secondary | ICD-10-CM

## 2020-10-14 DIAGNOSIS — R011 Cardiac murmur, unspecified: Secondary | ICD-10-CM | POA: Diagnosis not present

## 2020-10-14 DIAGNOSIS — Z720 Tobacco use: Secondary | ICD-10-CM

## 2020-10-14 NOTE — Patient Instructions (Signed)
Medication Instructions:  Your physician recommends that you continue on your current medications as directed. Please refer to the Current Medication list given to you today.  *If you need a refill on your cardiac medications before your next appointment, please call your pharmacy*   Lab Work: -None If you have labs (blood work) drawn today and your tests are completely normal, you will receive your results only by: Marland Kitchen MyChart Message (if you have MyChart) OR . A paper copy in the mail If you have any lab test that is abnormal or we need to change your treatment, we will call you to review the results.   Testing/Procedures:  Your physician has requested that you have an echocardiogram for [place].  Echocardiography is a painless test that uses sound waves to create images of your heart. It provides your doctor with information about the size and shape of your heart and how well your heart's chambers and valves are working. This procedure takes approximately one hour. There are no restrictions for this procedure.   Your physician has requested that you have a lower or upper extremity arterial duplex. Bilateral for Left Leg Pain with ABI. This test is an ultrasound of the arteries in the legs or arms. It looks at arterial blood flow in the legs and arms. Allow one hour for Lower and Upper Arterial scans. There are no restrictions or special instructions  Your physician has requested that you have an ankle brachial index (ABI). During this test an ultrasound and blood pressure cuff are used to evaluate the arteries that supply the arms and legs with blood. Allow thirty minutes for this exam. There are no restrictions or special instructions.    Follow-Up: At [facility], you and your health needs are our priority.  As part of our continuing mission to provide you with exceptional heart care, we have created designated Provider Care Teams.  These Care Teams include your primary Cardiologist  (physician) and Advanced Practice Providers (APPs -  Physician Assistants and Nurse Practitioners) who all work together to provide you with the care you need, when you need it.  We recommend signing up for the patient portal called "MyChart".  Sign up information is provided on this After Visit Summary.  MyChart is used to connect with patients for Virtual Visits (Telemedicine).  Patients are able to view lab/test results, encounter notes, upcoming appointments, etc.  Non-urgent messages can be sent to your provider as well.   To learn more about what you can do with MyChart, go to [email or web address].    Your next appointment:   3 month(s)  The format for your next appointment:   In Person  Provider:   Eleonore Chiquito, MD   Other Instructions -None

## 2020-11-01 ENCOUNTER — Other Ambulatory Visit: Payer: Self-pay | Admitting: Registered Nurse

## 2020-11-01 DIAGNOSIS — E2839 Other primary ovarian failure: Secondary | ICD-10-CM

## 2020-11-03 ENCOUNTER — Other Ambulatory Visit: Payer: Self-pay

## 2020-11-03 ENCOUNTER — Ambulatory Visit (HOSPITAL_COMMUNITY)
Admission: RE | Admit: 2020-11-03 | Discharge: 2020-11-03 | Disposition: A | Discharge status: Home / Self Care | Payer: Medicare HMO | Source: Ambulatory Visit | Attending: Cardiovascular Disease | Admitting: Cardiovascular Disease

## 2020-11-03 DIAGNOSIS — M79605 Pain in left leg: Secondary | ICD-10-CM | POA: Diagnosis present

## 2020-11-03 DIAGNOSIS — R011 Cardiac murmur, unspecified: Secondary | ICD-10-CM | POA: Diagnosis present

## 2020-11-03 DIAGNOSIS — Z720 Tobacco use: Secondary | ICD-10-CM

## 2020-11-03 DIAGNOSIS — I10 Essential (primary) hypertension: Secondary | ICD-10-CM | POA: Diagnosis present

## 2020-11-08 ENCOUNTER — Other Ambulatory Visit: Payer: Self-pay

## 2020-11-08 ENCOUNTER — Ambulatory Visit (HOSPITAL_COMMUNITY): Payer: Medicare HMO | Attending: Cardiology

## 2020-11-08 DIAGNOSIS — R011 Cardiac murmur, unspecified: Secondary | ICD-10-CM | POA: Diagnosis present

## 2020-11-08 DIAGNOSIS — M79605 Pain in left leg: Secondary | ICD-10-CM | POA: Diagnosis present

## 2020-11-08 DIAGNOSIS — I10 Essential (primary) hypertension: Secondary | ICD-10-CM | POA: Diagnosis present

## 2020-11-08 DIAGNOSIS — Z720 Tobacco use: Secondary | ICD-10-CM

## 2020-11-08 LAB — ECHOCARDIOGRAM COMPLETE
Area-P 1/2: 7.26 cm2
S' Lateral: 1.65 cm

## 2020-12-20 ENCOUNTER — Ambulatory Visit (INDEPENDENT_AMBULATORY_CARE_PROVIDER_SITE_OTHER): Payer: Medicare HMO | Admitting: Cardiovascular Disease

## 2020-12-20 ENCOUNTER — Other Ambulatory Visit: Payer: Self-pay

## 2020-12-20 ENCOUNTER — Encounter: Payer: Self-pay | Admitting: Cardiovascular Disease

## 2020-12-20 VITALS — BP 132/78 | HR 94 | Ht 65.0 in | Wt 153.0 lb

## 2020-12-20 DIAGNOSIS — I739 Peripheral vascular disease, unspecified: Secondary | ICD-10-CM | POA: Diagnosis not present

## 2020-12-20 DIAGNOSIS — I10 Essential (primary) hypertension: Secondary | ICD-10-CM | POA: Diagnosis not present

## 2020-12-20 DIAGNOSIS — Z720 Tobacco use: Secondary | ICD-10-CM | POA: Diagnosis not present

## 2020-12-20 DIAGNOSIS — M79605 Pain in left leg: Secondary | ICD-10-CM

## 2020-12-20 MED ORDER — SODIUM CHLORIDE 0.9% FLUSH: 3.0000 mL | Freq: Two times a day (BID) | INTRAVENOUS | Status: DC

## 2020-12-20 NOTE — Progress Notes (Signed)
12/20/2020 Kara Bailey   06-25-1956  [id number]  Primary Physician Kara Moro, DO Primary Cardiologist: Kara Harp MD Kara Bailey, [place], [place]  HPI:  Kara Bailey is a 65 y.o. mildly overweight divorced African-American female mother of 2, grandmother of 52 grandchildren referred by Dr. Audie Bailey, her cardiologist, for peripheral vascular evaluation.  She is retired from doing factory work.  Risk factors include smoking up to 1/2 pack a day for the last 50 years and treated hypertension.  There is no family history.  She is never had heart attack or stroke.  Denies chest pain or shortness of breath.  She complains of left calf lifestyle of any claudication which she has had for several years.  Recent Doppler studies performed 11/03/2020 revealed a right ABI 1.0 and a left of 0.89 with moderate disease noted in the mid left SFA.   Current Meds  Medication Sig  . busPIRone (BUSPAR) 15 MG tablet Take 1 tablet (15 mg total) by mouth 2 (two) times daily.  Marland Kitchen diltiazem (CARDIZEM CD) 180 MG 24 hr capsule TAKE 1 CAPSULE BY MOUTH DAILY.  . methocarbamol (ROBAXIN) 500 MG tablet Take 1 tablet (500 mg total) by mouth every 6 (six) hours as needed for muscle spasms.  . mirtazapine (REMERON) 30 MG tablet Take 0.5 tablets (15 mg total) by mouth at bedtime.  . naproxen (NAPROSYN) 500 MG tablet Take 1 tablet (500 mg total) by mouth 2 (two) times daily with a meal.  . pantoprazole (PROTONIX) 40 MG tablet Take 1 tablet (40 mg total) by mouth daily.     No Known Allergies  Social History   Socioeconomic History  . Marital status: Divorced    Spouse name: Not on file  . Number of children: 2  . Years of education: Not on file  . Highest education level: Not on file  Occupational History  . Occupation: retired  Tobacco Use  . Smoking status: Current Every Day Smoker    Packs/day: 0.50    Years: 40.00    Pack years: 20.00    Types: Cigarettes    Start date: 11/12/1968  . Smokeless  tobacco: Never Used  . Tobacco comment: cutting back, wearing patches  Vaping Use  . Vaping Use: Never used  Substance and Sexual Activity  . Alcohol use: Not Currently    Alcohol/week: 0.0 standard drinks    Comment: beer on weekends (6-pack)  . Drug use: No  . Sexual activity: Not Currently  Other Topics Concern  . Not on file  Social History Narrative  . Not on file   Social Determinants of Health   Financial Resource Strain: Not on file  Food Insecurity: Not on file  Transportation Needs: Not on file  Physical Activity: Not on file  Stress: Not on file  Social Connections: Not on file  Intimate Partner Violence: Not on file     Review of Systems: General: negative for chills, fever, night sweats or weight changes.  Cardiovascular: negative for chest pain, dyspnea on exertion, edema, orthopnea, palpitations, paroxysmal nocturnal dyspnea or shortness of breath Dermatological: negative for rash Respiratory: negative for cough or wheezing Urologic: negative for hematuria Abdominal: negative for nausea, vomiting, diarrhea, bright red blood per rectum, melena, or hematemesis Neurologic: negative for visual changes, syncope, or dizziness All other systems reviewed and are otherwise negative except as noted above.    Blood pressure 132/78, pulse 94, height 5\' 5"  (1.651 m), weight 153 lb (69.4 kg), SpO2 93 %.  General appearance: alert and no distress Neck: no adenopathy, no carotid bruit, no JVD, supple, symmetrical, trachea midline and thyroid not enlarged, symmetric, no tenderness/mass/nodules Lungs: clear to auscultation bilaterally Heart: regular rate and rhythm, S1, S2 normal, no murmur, click, rub or gallop Extremities: extremities normal, atraumatic, no cyanosis or edema Pulses: 2+ and symmetric Skin: Skin color, texture, turgor normal. No rashes or lesions Neurologic: Alert and oriented X 3, normal strength and tone. Normal symmetric reflexes. Normal coordination and  gait  EKG sinus rhythm 94 with LVH voltage repolarization changes.  Personally reviewed this EKG.  ASSESSMENT AND PLAN:   Peripheral arterial disease ([place]) Kara Bailey was referred to me by Dr. Jenetta DownerNori Bailey for evaluation of symptomatic PAD.  She has left calf claudication which is lifestyle limiting.  Dopplers performed 11/03/2020 revealed a left ABI of 0.89 with moderate disease in the mid left SFA.  She wishes to proceed with angiography and endovascular therapy.      Kara Harp MD FACP,FACC,FAHA, [facility] 12/20/2020 1:49 PM

## 2020-12-20 NOTE — Patient Instructions (Signed)
    [place] [address] Dept: [phone] Loc: [place]  12/20/2020  You are scheduled for a Peripheral Angiogram on Monday, February 21 with Dr. Quay Burow.  1. Please arrive at the [facility] (Main Entrance A) at [facility]: [address] [place], [place] [address] at 5:30 AM (This time is two hours before your procedure to ensure your preparation). Free valet parking service is available.   Special note: Every effort is made to have your procedure done on time. Please understand that emergencies sometimes delay scheduled procedures.  2. Diet: Do not eat solid foods after midnight.  The patient may have clear liquids until 5am upon the day of the procedure.  3. Labs: You will need to have blood drawn today.  4. Medication instructions in preparation for your procedure:  On the morning of your procedure, take your Aspirin and any morning medicines NOT listed above.  You may use sips of water.  5. Plan for one night stay--bring personal belongings. 6. Bring a current list of your medications and current insurance cards. 7. You MUST have a responsible person to drive you home. 8. Someone MUST be with you the first 24 hours after you arrive home or your discharge will be delayed. 9. Please wear clothes that are easy to get on and off and wear slip-on shoes.  Thank you for allowing [place] to care for you!   -- [place] [facility]  You will need a COVID-19  test prior to your procedure. You are scheduled for Friday, 12/30/20 at 9:00 AM. This is a Drive Up Visit at [address] [place], [place] [address]. Someone will direct you to the appropriate testing line. Stay in your car and someone will be with you shortly.  Pt will need bilateral lower extremity ultrasounds with ABIs 1 week post-procedure.  Pt will need  2 week follow up appointment with Dr. Gwenlyn Found.

## 2020-12-20 NOTE — Assessment & Plan Note (Signed)
Kara Bailey was referred to me by Dr. Jenetta DownerNori Riis for evaluation of symptomatic PAD.  She has left calf claudication which is lifestyle limiting.  Dopplers performed 11/03/2020 revealed a left ABI of 0.89 with moderate disease in the mid left SFA.  She wishes to proceed with angiography and endovascular therapy.

## 2020-12-21 LAB — BASIC METABOLIC PANEL
BUN/Creatinine Ratio: 8 — ABNORMAL LOW (ref 12–28)
BUN: 8 mg/dL (ref 8–27)
CO2: 23 mmol/L (ref 20–29)
Calcium: 9.3 mg/dL (ref 8.7–10.3)
Chloride: 103 mmol/L (ref 96–106)
Creatinine, Ser: 0.96 mg/dL (ref 0.57–1.00)
GFR calc Af Amer: 71 mL/min/{1.73_m2} (ref >59)
GFR calc non Af Amer: 62 mL/min/{1.73_m2} (ref >59)
Glucose: 79 mg/dL (ref 65–99)
Potassium: 4.1 mmol/L (ref 3.5–5.2)
Sodium: 141 mmol/L (ref 134–144)

## 2020-12-21 LAB — CBC
Hematocrit: 41.8 % (ref 34.0–46.6)
Hemoglobin: 14.7 g/dL (ref 11.1–15.9)
MCH: 34.3 pg — ABNORMAL HIGH (ref 26.6–33.0)
MCHC: 35.2 g/dL (ref 31.5–35.7)
MCV: 98 fL — ABNORMAL HIGH (ref 79–97)
Platelets: 252 10*3/uL (ref 150–450)
RBC: 4.28 x10E6/uL (ref 3.77–5.28)
RDW: 12.8 % (ref 11.7–15.4)
WBC: 11.6 10*3/uL — ABNORMAL HIGH (ref 3.4–10.8)

## 2020-12-29 ENCOUNTER — Encounter (HOSPITAL_COMMUNITY): Payer: Self-pay

## 2020-12-29 ENCOUNTER — Emergency Department (HOSPITAL_COMMUNITY): Payer: Medicare HMO

## 2020-12-29 ENCOUNTER — Telehealth: Payer: Self-pay | Admitting: *Deleted

## 2020-12-29 ENCOUNTER — Other Ambulatory Visit: Payer: Self-pay

## 2020-12-29 ENCOUNTER — Inpatient Hospital Stay (HOSPITAL_COMMUNITY): Payer: Medicare HMO

## 2020-12-29 ENCOUNTER — Inpatient Hospital Stay (HOSPITAL_COMMUNITY)
Admission: EM | Admit: 2020-12-29 | Discharge: 2020-12-31 | DRG: 065 | Disposition: A | Discharge status: Home Health | Payer: Medicare HMO | Source: Ambulatory Visit | Attending: Internal Medicine | Admitting: Internal Medicine

## 2020-12-29 DIAGNOSIS — E785 Hyperlipidemia, unspecified: Secondary | ICD-10-CM | POA: Diagnosis present

## 2020-12-29 DIAGNOSIS — B182 Chronic viral hepatitis C: Secondary | ICD-10-CM | POA: Diagnosis present

## 2020-12-29 DIAGNOSIS — F32A Depression, unspecified: Secondary | ICD-10-CM | POA: Diagnosis present

## 2020-12-29 DIAGNOSIS — I10 Essential (primary) hypertension: Secondary | ICD-10-CM | POA: Diagnosis present

## 2020-12-29 DIAGNOSIS — K7460 Unspecified cirrhosis of liver: Secondary | ICD-10-CM | POA: Diagnosis present

## 2020-12-29 DIAGNOSIS — I639 Cerebral infarction, unspecified: Secondary | ICD-10-CM | POA: Diagnosis present

## 2020-12-29 DIAGNOSIS — I739 Peripheral vascular disease, unspecified: Secondary | ICD-10-CM | POA: Diagnosis present

## 2020-12-29 DIAGNOSIS — Z7141 Alcohol abuse counseling and surveillance of alcoholic: Secondary | ICD-10-CM

## 2020-12-29 DIAGNOSIS — I119 Hypertensive heart disease without heart failure: Secondary | ICD-10-CM | POA: Diagnosis present

## 2020-12-29 DIAGNOSIS — Z823 Family history of stroke: Secondary | ICD-10-CM | POA: Diagnosis not present

## 2020-12-29 DIAGNOSIS — F17210 Nicotine dependence, cigarettes, uncomplicated: Secondary | ICD-10-CM | POA: Diagnosis present

## 2020-12-29 DIAGNOSIS — R4701 Aphasia: Secondary | ICD-10-CM | POA: Diagnosis present

## 2020-12-29 DIAGNOSIS — R471 Dysarthria and anarthria: Secondary | ICD-10-CM | POA: Diagnosis present

## 2020-12-29 DIAGNOSIS — Z8719 Personal history of other diseases of the digestive system: Secondary | ICD-10-CM | POA: Diagnosis not present

## 2020-12-29 DIAGNOSIS — I6389 Other cerebral infarction: Secondary | ICD-10-CM | POA: Diagnosis not present

## 2020-12-29 DIAGNOSIS — F1010 Alcohol abuse, uncomplicated: Secondary | ICD-10-CM | POA: Diagnosis not present

## 2020-12-29 DIAGNOSIS — G8191 Hemiplegia, unspecified affecting right dominant side: Secondary | ICD-10-CM | POA: Diagnosis present

## 2020-12-29 DIAGNOSIS — J449 Chronic obstructive pulmonary disease, unspecified: Secondary | ICD-10-CM | POA: Diagnosis present

## 2020-12-29 DIAGNOSIS — K219 Gastro-esophageal reflux disease without esophagitis: Secondary | ICD-10-CM | POA: Diagnosis present

## 2020-12-29 DIAGNOSIS — F411 Generalized anxiety disorder: Secondary | ICD-10-CM | POA: Diagnosis present

## 2020-12-29 DIAGNOSIS — F17200 Nicotine dependence, unspecified, uncomplicated: Secondary | ICD-10-CM | POA: Diagnosis not present

## 2020-12-29 DIAGNOSIS — Z716 Tobacco abuse counseling: Secondary | ICD-10-CM | POA: Diagnosis not present

## 2020-12-29 DIAGNOSIS — R29810 Facial weakness: Secondary | ICD-10-CM | POA: Diagnosis present

## 2020-12-29 DIAGNOSIS — Z20822 Contact with and (suspected) exposure to covid-19: Secondary | ICD-10-CM | POA: Diagnosis present

## 2020-12-29 DIAGNOSIS — I6381 Other cerebral infarction due to occlusion or stenosis of small artery: Principal | ICD-10-CM | POA: Diagnosis present

## 2020-12-29 DIAGNOSIS — Z8249 Family history of ischemic heart disease and other diseases of the circulatory system: Secondary | ICD-10-CM | POA: Diagnosis not present

## 2020-12-29 DIAGNOSIS — Z8673 Personal history of transient ischemic attack (TIA), and cerebral infarction without residual deficits: Secondary | ICD-10-CM

## 2020-12-29 DIAGNOSIS — Z79899 Other long term (current) drug therapy: Secondary | ICD-10-CM

## 2020-12-29 HISTORY — DX: Cerebral infarction, unspecified: I63.9

## 2020-12-29 LAB — COMPREHENSIVE METABOLIC PANEL
ALT: 21 U/L (ref 0–44)
AST: 19 U/L (ref 15–41)
Albumin: 4.1 g/dL (ref 3.5–5.0)
Alkaline Phosphatase: 89 U/L (ref 38–126)
Anion gap: 9 (ref 5–15)
BUN: 10 mg/dL (ref 8–23)
CO2: 24 mmol/L (ref 22–32)
Calcium: 9.1 mg/dL (ref 8.9–10.3)
Chloride: 104 mmol/L (ref 98–111)
Creatinine, Ser: 0.96 mg/dL (ref 0.44–1.00)
GFR, Estimated: >60 mL/min (ref >60)
Glucose, Bld: 105 mg/dL — ABNORMAL HIGH (ref 70–99)
Potassium: 3.6 mmol/L (ref 3.5–5.1)
Sodium: 137 mmol/L (ref 135–145)
Total Bilirubin: 0.8 mg/dL (ref 0.3–1.2)
Total Protein: 7.7 g/dL (ref 6.5–8.1)

## 2020-12-29 LAB — DIFFERENTIAL
Abs Immature Granulocytes: 0.02 10*3/uL (ref 0.00–0.07)
Basophils Absolute: 0 10*3/uL (ref 0.0–0.1)
Basophils Relative: 0 %
Eosinophils Absolute: 0.1 10*3/uL (ref 0.0–0.5)
Eosinophils Relative: 1 %
Immature Granulocytes: 0 %
Lymphocytes Relative: 19 %
Lymphs Abs: 1.6 10*3/uL (ref 0.7–4.0)
Monocytes Absolute: 0.7 10*3/uL (ref 0.1–1.0)
Monocytes Relative: 8 %
Neutro Abs: 6.4 10*3/uL (ref 1.7–7.7)
Neutrophils Relative %: 72 %

## 2020-12-29 LAB — LIPID PANEL
Cholesterol: 113 mg/dL (ref 0–200)
HDL: 41 mg/dL (ref >40)
LDL Cholesterol: 58 mg/dL (ref 0–99)
Total CHOL/HDL Ratio: 2.8 RATIO
Triglycerides: 71 mg/dL (ref <150)
VLDL: 14 mg/dL (ref 0–40)

## 2020-12-29 LAB — CBC
HCT: 43.2 % (ref 36.0–46.0)
HCT: 42.8 % (ref 36.0–46.0)
Hemoglobin: 14.4 g/dL (ref 12.0–15.0)
Hemoglobin: 14.4 g/dL (ref 12.0–15.0)
MCH: 33.8 pg (ref 26.0–34.0)
MCH: 33.3 pg (ref 26.0–34.0)
MCHC: 33.3 g/dL (ref 30.0–36.0)
MCHC: 33.6 g/dL (ref 30.0–36.0)
MCV: 101.4 fL — ABNORMAL HIGH (ref 80.0–100.0)
MCV: 99.1 fL (ref 80.0–100.0)
Platelets: 259 10*3/uL (ref 150–400)
Platelets: 283 10*3/uL (ref 150–400)
RBC: 4.26 MIL/uL (ref 3.87–5.11)
RBC: 4.32 MIL/uL (ref 3.87–5.11)
RDW: 13.4 % (ref 11.5–15.5)
RDW: 13.2 % (ref 11.5–15.5)
WBC: 8.8 10*3/uL (ref 4.0–10.5)
WBC: 9.7 10*3/uL (ref 4.0–10.5)
nRBC: 0 % (ref 0.0–0.2)
nRBC: 0 % (ref 0.0–0.2)

## 2020-12-29 LAB — MAGNESIUM: Magnesium: 2.5 mg/dL — ABNORMAL HIGH (ref 1.7–2.4)

## 2020-12-29 LAB — RAPID URINE DRUG SCREEN, HOSP PERFORMED
Amphetamines: NOT DETECTED
Barbiturates: NOT DETECTED
Benzodiazepines: NOT DETECTED
Cocaine: NOT DETECTED
Opiates: NOT DETECTED
Tetrahydrocannabinol: NOT DETECTED

## 2020-12-29 LAB — ETHANOL: Alcohol, Ethyl (B): <10 mg/dL (ref <10)

## 2020-12-29 LAB — RESP PANEL BY RT-PCR (FLU A&B, COVID) ARPGX2
Influenza A by PCR: NEGATIVE
Influenza B by PCR: NEGATIVE
SARS Coronavirus 2 by RT PCR: NEGATIVE

## 2020-12-29 LAB — APTT: aPTT: 26 seconds (ref 24–36)

## 2020-12-29 LAB — URINALYSIS, ROUTINE W REFLEX MICROSCOPIC
Bilirubin Urine: NEGATIVE
Glucose, UA: NEGATIVE mg/dL
Ketones, ur: NEGATIVE mg/dL
Leukocytes,Ua: NEGATIVE
Nitrite: NEGATIVE
Protein, ur: NEGATIVE mg/dL
Specific Gravity, Urine: 1.004 — ABNORMAL LOW (ref 1.005–1.030)
pH: 6 (ref 5.0–8.0)

## 2020-12-29 LAB — PROTIME-INR
INR: 1 (ref 0.8–1.2)
Prothrombin Time: 13 seconds (ref 11.4–15.2)

## 2020-12-29 LAB — HEMOGLOBIN A1C
Hgb A1c MFr Bld: 5.5 % (ref 4.8–5.6)
Mean Plasma Glucose: 111.15 mg/dL

## 2020-12-29 LAB — PHOSPHORUS: Phosphorus: 2.9 mg/dL (ref 2.5–4.6)

## 2020-12-29 MED ORDER — THIAMINE HCL 100 MG PO TABS
100.0000 mg | ORAL_TABLET | Freq: Every day | ORAL | Status: DC
  Administered 2020-12-31: 100 mg via ORAL
  Filled 2020-12-29: qty 1

## 2020-12-29 MED ORDER — FOLIC ACID 1 MG PO TABS
1.0000 mg | ORAL_TABLET | Freq: Every day | ORAL | Status: DC
  Administered 2020-12-31: 1 mg via ORAL
  Filled 2020-12-29: qty 1

## 2020-12-29 MED ORDER — SODIUM CHLORIDE 0.9 % IV SOLN: INTRAVENOUS | Status: DC

## 2020-12-29 MED ORDER — WHITE PETROLATUM EX OINT
TOPICAL_OINTMENT | CUTANEOUS | Status: AC
  Filled 2020-12-29: qty 28.35

## 2020-12-29 MED ORDER — LABETALOL HCL 5 MG/ML IV SOLN
5.0000 mg | INTRAVENOUS | Status: DC | PRN
  Administered 2020-12-30: 5 mg via INTRAVENOUS
  Filled 2020-12-29: qty 4

## 2020-12-29 MED ORDER — ONDANSETRON HCL 4 MG/2ML IJ SOLN: 4.0000 mg | Freq: Four times a day (QID) | INTRAMUSCULAR | Status: DC | PRN

## 2020-12-29 MED ORDER — ASPIRIN 325 MG PO TABS: 325.0000 mg | ORAL_TABLET | Freq: Every day | ORAL | Status: DC

## 2020-12-29 MED ORDER — ADULT MULTIVITAMIN W/MINERALS CH
1.0000 | ORAL_TABLET | Freq: Every day | ORAL | Status: DC
  Administered 2020-12-31: 1 via ORAL
  Filled 2020-12-29: qty 1

## 2020-12-29 MED ORDER — LORAZEPAM 2 MG/ML IJ SOLN: 1.0000 mg | INTRAMUSCULAR | Status: DC | PRN

## 2020-12-29 MED ORDER — THIAMINE HCL 100 MG/ML IJ SOLN
100.0000 mg | Freq: Every day | INTRAMUSCULAR | Status: DC
  Administered 2020-12-29 – 2020-12-30 (×2): 100 mg via INTRAVENOUS
  Filled 2020-12-29 (×2): qty 2

## 2020-12-29 MED ORDER — LABETALOL HCL 5 MG/ML IV SOLN: 5.0000 mg | INTRAVENOUS | Status: DC | PRN

## 2020-12-29 MED ORDER — ASPIRIN EC 325 MG PO TBEC: 325.0000 mg | DELAYED_RELEASE_TABLET | Freq: Every day | ORAL | Status: DC

## 2020-12-29 MED ORDER — ASPIRIN 300 MG RE SUPP: 300.0000 mg | Freq: Every day | RECTAL | Status: DC

## 2020-12-29 MED ORDER — ACETAMINOPHEN 325 MG PO TABS
650.0000 mg | ORAL_TABLET | Freq: Four times a day (QID) | ORAL | Status: DC | PRN
  Filled 2020-12-29: qty 2

## 2020-12-29 MED ORDER — IOHEXOL 350 MG/ML SOLN
100.0000 mL | Freq: Once | INTRAVENOUS | Status: AC | PRN
  Administered 2020-12-29: 100 mL via INTRAVENOUS

## 2020-12-29 MED ORDER — LORAZEPAM 1 MG PO TABS: 1.0000 mg | ORAL_TABLET | ORAL | Status: DC | PRN

## 2020-12-29 MED ORDER — PANTOPRAZOLE SODIUM 40 MG IV SOLR
40.0000 mg | INTRAVENOUS | Status: DC
  Administered 2020-12-29 – 2020-12-30 (×2): 40 mg via INTRAVENOUS
  Filled 2020-12-29 (×2): qty 40

## 2020-12-29 MED ORDER — ATORVASTATIN CALCIUM 80 MG PO TABS
80.0000 mg | ORAL_TABLET | Freq: Every day | ORAL | Status: DC
  Administered 2020-12-31: 80 mg via ORAL
  Filled 2020-12-29: qty 1

## 2020-12-29 NOTE — Consult Note (Signed)
Neurology Consultation Reason for Consult: Right-sided weakness Referring Physician: Nevada Crane, C  CC: Right-sided weakness  History is obtained from: Chart review, patient, family  HPI: Kara Bailey is a 65 y.o. female with a history of smoking, peripheral artery disease, hypertension who presents with right-sided weakness and difficulty speaking that has been going on since waking up on Thursday.  She went to bed in her normal state of health on Wednesday.  On Thursday morning, it was noticed that she was having some trouble with her speech and then today it was worse and therefore she sought care in the emergency department.  In the [facility] ER, she had an MRI which demonstrated small thalamic infarct.  Of note, when I asked about recent issues with bleeding, she mentioned that she had recently vomited blood.  She describes it as light reddish emesis in color.  On further discussion, however, she had drank a V8 earlier in the day.  She still thinks it was blood, however.  LKW: Wednesday tpa given?: no, outside of window    ROS: A 14 point ROS was performed and is negative except as noted in the HPI.  Past Medical History:  Diagnosis Date  . Anxiety   . Chest pain 08/2014   unspecified  . Cirrhosis ([place])   . Depression   . Dizziness 08/2014  . Dyspnea 08/2014  . Emphysema lung ([place])   . GERD (gastroesophageal reflux disease)   . Hepatitis C   . Hepatitis C virus infection   . Hypertension   . PAC (premature atrial contraction) 10/11/2014  . PAD (peripheral artery disease) ([place])   . Palpitations   . PVC (premature ventricular contraction) 10/11/2014  . Shingles    left side  . 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 Tachycardia 08/2014  . Ventral hernia   . Weakness 08/2014     Family History  Problem Relation Age of Onset  . Heart failure Mother   . Heart attack Mother   . Heart disease Mother   . Hypertension Mother   . Hyperlipidemia Mother   . CVA  Father   . Alzheimer's disease Father   . Cancer Neg Hx   . Colon cancer Neg Hx   . Esophageal cancer Neg Hx   . Rectal cancer Neg Hx   . Stomach cancer Neg Hx      Social History:  reports that she has been smoking cigarettes. She started smoking about 52 years ago. She has a 20.00 pack-year smoking history. She has never used smokeless tobacco. She reports previous alcohol use. She reports that she does not use drugs.   Exam: Current vital signs: BP (!) 163/79 (BP Location: Right Arm)   Pulse 88   Temp 98.2 F (36.8 C)   Resp 16   SpO2 98%  Vital signs in last 24 hours: Temp:  [98.1 F (36.7 C)-98.3 F (36.8 C)] 98.2 F (36.8 C) (02/17 1858) Pulse Rate:  [84-104] 88 (02/17 1858) Resp:  [13-26] 16 (02/17 1858) BP: (120-165)/(79-104) 163/79 (02/17 1858) SpO2:  [96 %-100 %] 98 % (02/17 1858)   Physical Exam  Constitutional: Appears well-developed and well-nourished.  Psych: Affect appropriate to situation Eyes: No scleral injection HENT: No OP obstruction MSK: no joint deformities.  Cardiovascular: Normal rate and regular rhythm.  Respiratory: Effort normal, non-labored breathing GI: Soft.  No distension. There is no tenderness.  Skin: WDI  Neuro: Mental Status: Patient is awake, alert, oriented to person, place, year, and situation.  When asked month, she states "November," then immediately looks frustrated and says it is not November it is February.  She has an expressive aphasia, able to get her point across but with latency of speech. Cranial Nerves: II: Visual Fields are full. Pupils are equal, round, and reactive to light.   III,IV, VI: EOMI without ptosis or diploplia.  V: Facial sensation is symmetric to temperature VII: Facial movement with mild right facial weakness VIII: hearing is intact to voice X: Uvula elevates symmetrically XI: Shoulder shrug is symmetric. XII: tongue is midline without atrophy or fasciculations.  Motor: Tone is normal. Bulk is  normal.  4/5 strength in the right arm and leg Sensory: Sensation is symmetric to light touch and temperature in the arms and legs. Cerebellar: Consistent with weakness on right     I have reviewed labs in epic and the results pertinent to this consultation are: 0.96  I have reviewed the images obtained: MRI brain-small thalamic stroke on the left  Impression: 65 year old female with small ischemic stroke likely secondary to small vessel disease.  She is going to need significant therapy to help her recover, and needs risk factor modification.  Given her concern for possible recent hematemesis, I would favor getting a Hemoccult sample prior to initiating dual antiplatelet therapy.  Recommendations: - HgbA1c, fasting lipid panel - Frequent neuro checks - Echocardiogram - Prophylactic therapy-Antiplatelet med: Aspirin - dose 325mg  PO or 300mg  PR until Hemoccult results.  If negative, would start dual antiplatelet therapy with aspirin 81 mg and Plavix 75 mg daily - Risk factor modification - Telemetry monitoring - PT consult, OT consult, Speech consult - Stroke team to follow  Roland Rack, MD [facility] [phone]  If 7pm- 7am, please page neurology on call as listed in [place].

## 2020-12-29 NOTE — ED Notes (Signed)
[facility] called for transport to [facility]

## 2020-12-29 NOTE — ED Notes (Signed)
Report given to Shanon Brow at [facility]

## 2020-12-29 NOTE — H&P (Addendum)
History and Physical  Kara Bailey CHE:[id number] DOB: [birth year] DOA: 12/29/2020  Referring physician:  Cherre Robins, [place] PCP: Andree Moro, DO  Outpatient Specialists: Cardiology. Patient coming from: Home.  Chief Complaint: Speech difficulty for 3 days  HPI: Kara Bailey is a 65 y.o. female with medical history significant for peripheral vascular disease followed by cardiology, essential hypertension, hyperlipidemia, chronic anxiety/depression, GERD, LA grade A esophagitis, esophageal stenosis postop dilatation, tobacco use disorder, alcohol use disorder, who presented to [facility] ED due to speech difficulty of 3 days duration.  Associated with difficulty with swallowing.  History is limited by patient's expressive aphasia.  She reports on Monday she noted that her mouth was dry and she was having difficulty swallowing and getting her words out.  She was hoping that it would resolve on its own but persisted.  She called her primary care provider's office today and was advised to come to the ED for further evaluation.  She reports daily tobacco use and alcohol use, 1 pack of beer per week.  She does not take a daily aspirin however she is on Lipitor daily.  Work-up in the ED revealed acute CVA.  EDP consulted neurology/stroke team who recommended admission to [facility] for further stroke work-up.  ED Course:  Afebrile.  BP 154/82, heart rate 84, respiration rate 16.  Lab studies essentially unremarkable.  UDS negative.  UA negative.  COVID-19 screening test negative.  Right brain revealed 8 mm of acute infarction in the left anterolateral thalamus/posterior limb internal capsule.  Extensive chronic small vessel ischemic changes as well throughout the brain as outlined above.  Old hemorrhagic infarction of the right superior cerebellum right basal ganglia and radiating white matter tracts.  Review of Systems: Review of systems as noted in the HPI. All other systems reviewed and are  negative.   Past Medical History:  Diagnosis Date  . Anxiety   . Chest pain 08/2014   unspecified  . Cirrhosis ([place])   . Depression   . Dizziness 08/2014  . Dyspnea 08/2014  . Emphysema lung ([place])   . GERD (gastroesophageal reflux disease)   . Hepatitis C   . Hepatitis C virus infection   . Hypertension   . PAC (premature atrial contraction) 10/11/2014  . PAD (peripheral artery disease) ([place])   . Palpitations   . PVC (premature ventricular contraction) 10/11/2014  . Shingles    left side  . 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 Tachycardia 08/2014  . Ventral hernia   . Weakness 08/2014   Past Surgical History:  Procedure Laterality Date  . APPENDECTOMY    . CATARACT EXTRACTION, BILATERAL    . COLONOSCOPY    . COLONOSCOPY WITH PROPOFOL N/A 04/30/2016   Procedure: COLONOSCOPY WITH PROPOFOL;  Surgeon: Daneil Dolin, MD;  [facility];  Service: Endoscopy;  Laterality: N/A;  1230  . ESOPHAGOGASTRODUODENOSCOPY     approximately 2010  . ESOPHAGOGASTRODUODENOSCOPY (EGD) WITH PROPOFOL N/A 04/30/2016   Procedure: ESOPHAGOGASTRODUODENOSCOPY (EGD) WITH PROPOFOL;  Surgeon: Daneil Dolin, MD;  Location: [facility];  Service: Endoscopy;  Laterality: N/A;  . LEFT HEART CATHETERIZATION WITH CORONARY ANGIOGRAM N/A 10/29/2014   07-14-20- pt denies this Procedure: LEFT HEART CATHETERIZATION WITH CORONARY ANGIOGRAM;  Surgeon: Burnell Blanks, MD;  Location: [facility] CATH LAB;  Service: Cardiovascular;  Laterality: N/A;  . Venia Minks DILATION N/A 04/30/2016   Procedure: Venia Minks DILATION;  Surgeon: Daneil Dolin, MD;  Location: [facility];  Service: Endoscopy;  Laterality: N/A;  . POLYPECTOMY  04/30/2016   Procedure: POLYPECTOMY;  Surgeon: Daneil Dolin, MD;  Location: [facility];  Service: Endoscopy;;  Sigmoid colon polyp removed via hot snare  . UPPER GASTROINTESTINAL ENDOSCOPY      Social History:  reports that she has been smoking cigarettes. She started  smoking about 52 years ago. She has a 20.00 pack-year smoking history. She has never used smokeless tobacco. She reports previous alcohol use. She reports that she does not use drugs.   No Known Allergies  Family History  Problem Relation Age of Onset  . Heart failure Mother   . Heart attack Mother   . Heart disease Mother   . Hypertension Mother   . Hyperlipidemia Mother   . CVA Father   . Alzheimer's disease Father   . Cancer Neg Hx   . Colon cancer Neg Hx   . Esophageal cancer Neg Hx   . Rectal cancer Neg Hx   . Stomach cancer Neg Hx       Prior to Admission medications   Medication Sig Start Date End Date Taking? Authorizing Provider  albuterol (VENTOLIN HFA) 108 (90 Base) MCG/ACT inhaler Inhale 2 puffs into the lungs every 6 (six) hours as needed for shortness of breath or wheezing. 12/14/20  Yes [provider]  atorvastatin (LIPITOR) 10 MG tablet Take 10 mg by mouth daily.   Yes [provider]  cyclobenzaprine (FLEXERIL) 10 MG tablet Take 10 mg by mouth daily as needed for muscle spasms (Back pain). 10/08/20  Yes [provider]  diltiazem (CARDIZEM CD) 180 MG 24 hr capsule TAKE 1 CAPSULE BY MOUTH DAILY. Patient taking differently: Take by mouth daily. 02/04/19  Yes Newlin, Charlane Ferretti, MD  escitalopram (LEXAPRO) 20 MG tablet Take 20 mg by mouth daily. 11/23/20  Yes [provider]  famotidine (PEPCID) 40 MG tablet Take 40 mg by mouth daily. 10/25/20  Yes [provider]  hydroxypropyl methylcellulose / hypromellose (ISOPTO TEARS / GONIOVISC) 2.5 % ophthalmic solution Place 1 drop into both eyes 2 (two) times daily.   Yes [provider]  mirtazapine (REMERON) 30 MG tablet Take 0.5 tablets (15 mg total) by mouth at bedtime. Patient taking differently: Take 30 mg by mouth at bedtime. 08/13/18  Yes McClung, Angela M, PA-C  pantoprazole (PROTONIX) 40 MG tablet Take 1 tablet (40 mg total) by mouth daily. 08/13/18  Yes McClung, Dionne Bucy, PA-C  busPIRone (BUSPAR) 15 MG tablet Take 1 tablet (15 mg total) by mouth 2 (two) times daily. Patient not taking: Reported on 12/29/2020 08/13/18   Argentina Donovan, PA-C    Physical Exam: BP (!) 153/85 (BP Location: Right Arm)   Pulse 85   Temp 98.2 F (36.8 C) (Oral)   Resp 18   SpO2 97%   . General: 65 y.o. year-old female well developed well nourished in no acute distress.  Alert and oriented x3.  Expressive aphasia. . Cardiovascular: Regular rate and rhythm with no rubs or gallops.  No thyromegaly or JVD noted.  No lower extremity edema. 2/4 pulses in all 4 extremities. Marland Kitchen Respiratory: Clear to auscultation with no wheezes or rales. Good inspiratory effort. . Abdomen: Soft nontender nondistended with normal bowel sounds x4 quadrants. . Muskuloskeletal: No cyanosis, clubbing or edema noted bilaterally . Neuro: CN II-XII intact, strength, sensation, reflexes . Skin: No ulcerative lesions noted or rashes . Psychiatry: Judgement and insight appear normal. Mood is appropriate for condition and setting  Labs on Admission:  Basic Metabolic Panel: Recent Labs  Lab 12/29/20 1247  NA 137  K 3.6  CL 104  CO2 24  GLUCOSE 105*  BUN 10  CREATININE 0.96  CALCIUM 9.1   Liver Function Tests: Recent Labs  Lab 12/29/20 1247  AST 19  ALT 21  ALKPHOS 89  BILITOT 0.8  PROT 7.7  ALBUMIN 4.1   No results for input(s): LIPASE, AMYLASE in the last 168 hours. No results for input(s): AMMONIA in the last 168 hours. CBC: Recent Labs  Lab 12/29/20 1247  WBC 8.8  NEUTROABS 6.4  HGB 14.4  HCT 43.2  MCV 101.4*  PLT 259   Cardiac Enzymes: No results for input(s): CKTOTAL, CKMB, CKMBINDEX, TROPONINI in the last 168 hours.  BNP (last 3 results) No results for input(s): BNP in the last 8760 hours.  ProBNP (last 3 results) No results for input(s): PROBNP in the last 8760 hours.  CBG: No results for input(s): GLUCAP in the last 168 hours.  Radiological Exams on  Admission: CT HEAD WO CONTRAST  Result Date: 12/29/2020 CLINICAL DATA:  Dysarthria/slurred speech EXAM: CT HEAD WITHOUT CONTRAST TECHNIQUE: Contiguous axial images were obtained from the base of the skull through the vertex without intravenous contrast. COMPARISON:  December 25, 2007 FINDINGS: Brain: There is slight diffuse atrophy. Prominence of the cisterna magna is an anatomic variant. There is no intracranial mass, hemorrhage, extra-axial fluid collection, or midline shift. There is an age uncertain infarct involving the anterior limb of the right external capsule. There is decreased attenuation involving portions of the genu and immediately adjacent portions of the anterior and posterior limbs of the left internal capsule which appears acute. There is nearby decreased attenuation anterior superior left thalamus concerning for recent infarct in this area as well. Elsewhere, there is slight periventricular small vessel disease. Vascular: There is no appreciable hyperdense vessel. No appreciable vascular calcification. Skull: Bony calvarium appears intact. Sinuses/Orbits: Visualized paranasal sinuses are clear. Orbits appear symmetric bilaterally. Suspect mild drusen on each side. Other: Mastoid air cells are clear. IMPRESSION: 1. Suspect recent infarct involving a portion of the right internal capsule and immediately adjacent superior anterior right thalamus. Question older infarct involving the anterior limb of the right external capsule. 2. Slight periventricular small vessel disease. Slight diffuse atrophy. 3. No demonstrable mass or hemorrhage. Electronically Signed   By: Lowella Grip III M.D.   On: 12/29/2020 15:03   MR BRAIN WO CONTRAST  Result Date: 12/29/2020 CLINICAL DATA:  Slurred speech beginning 2-3 days ago. EXAM: MRI HEAD WITHOUT CONTRAST TECHNIQUE: Multiplanar, multiecho pulse sequences of the brain and surrounding structures were obtained without intravenous contrast. COMPARISON:   12/29/2020 head CT FINDINGS: Brain: Diffusion imaging shows an 8 mm acute infarction in the left anterolateral thalamus/posterior limb internal capsule. No other acute infarction. Elsewhere, extensive chronic small-vessel ischemic changes are seen throughout the pons. Old hemorrhagic infarction of the right superior cerebellum. Old hemorrhagic infarction of the right basal ganglia and radiating white matter tracts. Chronic small-vessel ischemic changes elsewhere affecting the thalami and basal ganglia. Chronic small-vessel ischemic changes elsewhere affecting the hemispheric white matter. No large vessel territory infarction. No mass, acute hemorrhage, hydrocephalus or extra-axial collection. Vascular: Major vessels at the base of the brain show flow. Skull and upper cervical spine: Negative Sinuses/Orbits: Clear/normal Other: None IMPRESSION: 1. 8 mm acute infarction in the left anterolateral thalamus/posterior limb internal capsule. 2. Extensive chronic small-vessel ischemic changes elsewhere throughout the brain as outlined above. 3. Old  hemorrhagic infarctions of the right superior cerebellum, right basal ganglia and radiating white matter tracts. Electronically Signed   By: Nelson Chimes M.D.   On: 12/29/2020 16:29    EKG: I independently viewed the EKG done and my findings are as followed: Sinus rhythm 88.  Nonspecific ST-T changes.  Assessment/Plan Present on Admission: . Acute CVA (cerebrovascular accident) [facility])  Active Problems:   Acute CVA (cerebrovascular accident) ([place])  Acute CVA involving left anterolateral abdominal/posterior limb internal capsule. MRI brain also showed old hemorrhagic stroke of the right superior cerebellum right basal ganglia and radiating white matter tracts. Presented with slurred speech and dysphagia of 3 days duration. Defer antiplatelet to neurology due to evidence of old hemorrhagic stroke on MRI brain. Ongoing stroke work-up Obtain CTA head and neck, 2D echo  bubble study Obtain lipid panel and hemoglobin A1c Monitor on telemetry Frequent neurochecks PT/OT/speech assessment Aspiration and fall precautions N.p.o. until passes swallow evaluation IV labetalol as needed with parameters  Essential hypertension BP is not at goal Target SBP 140 Closely monitor BP with evidence of old hemorrhagic stroke As needed IV labetalol with parameters  Hyperlipidemia Follow lipid panel Goal LDL less than 70 Increase home Lipitor dose to 80 mg after passing swallow evaluation  Currently n.p.o. until speech therapist assessment.  Tobacco use disorder Counseled on cessation.  Alcohol use disorder Counseled on cessation. CIWA protocol.  Chronic anxiety/depression Resume home medication when she passes a swallow evaluation.  GERD with history of esophagitis IV Protonix 40 mg daily Resume home regimen when able to pass a swallow evaluation  PVD followed by cardiology outpatient   DVT prophylaxis: SCDs.  Code Status: Full code, stated by the patient herself.  Family Communication: None at bedside.  Called her significant other Marshal at [phone] no answer.  Disposition Plan: Admit to telemetry medical/neuro at [place] called: Neurology consulted by EDP.  Admission status: Inpatient status.   Status is: Inpatient    Dispo:  Patient From: Home  Planned Disposition: Home  Expected discharge date: 12/31/2020  Medically stable for discharge: No, ongoing stroke work-up.         Kayleen Memos MD [facility] Pager [phone]  If 7PM-7AM, please contact night-coverage [email or web address] Password [facility]  12/29/2020, 5:46 PM

## 2020-12-29 NOTE — ED Provider Notes (Signed)
[facility] DEPT Provider Note   CSN: [id number] Arrival date & time: 12/29/20  1201     History Chief Complaint  Patient presents with  . Aphasia    Kara Bailey is a 65 y.o. female.  Patient with history of peripheral vascular disease, scheduled for left lower extremity angiography on 2/21 with possible endovascular intervention for left sided claudication, history of cirrhosis, smoking/COPD --presents the emergency department today for evaluation of speech difficulty.  Symptoms began 3 days ago.  Patient describes nonfluent speech with difficulty getting words out.  She has weakness in her lower extremities that are chronic states that she has had pain in her right leg because she needs to put more pressure on it.  She denies chest pain or shortness of breath. Patient denies other signs of stroke including: facial droop, slurred speech, new weakness/numbness in extremities, imbalance/trouble walking.  She states that she called her primary care office today and was recommended to come to the emergency department to be evaluated for stroke.  She denies blood thinners.         Past Medical History:  Diagnosis Date  . Anxiety   . Chest pain 08/2014   unspecified  . Cirrhosis ([place])   . Depression   . Dizziness 08/2014  . Dyspnea 08/2014  . Emphysema lung ([place])   . GERD (gastroesophageal reflux disease)   . Hepatitis C   . Hepatitis C virus infection   . Hypertension   . PAC (premature atrial contraction) 10/11/2014  . PAD (peripheral artery disease) ([place])   . Palpitations   . PVC (premature ventricular contraction) 10/11/2014  . Shingles    left side  . 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 Tachycardia 08/2014  . Ventral hernia   . Weakness 08/2014    Patient Active Problem List   Diagnosis Date Noted  . Peripheral arterial disease ([place]) 12/20/2020  . History of esophageal stricture 07/08/2020  . Insomnia 12/02/2017   . Chronic cough 08/14/2017  . Poor appetite 08/29/2016  . Peptic stricture of esophagus   . History of colonic polyps   . Hiatal hernia 03/16/2016  . Odynophagia 03/16/2016  . Dysphagia 03/16/2016  . Alcohol abuse 10/26/2015  . Essential hypertension 09/26/2015  . PAC (premature atrial contraction) 10/11/2014  . PVC (premature ventricular contraction) 10/11/2014  . Back muscle spasm 06/15/2014  . Generalized anxiety disorder 06/15/2014  . Smoking 06/15/2014  . Chronic hepatitis C without hepatic coma ([place]) 05/28/2013    Past Surgical History:  Procedure Laterality Date  . APPENDECTOMY    . CATARACT EXTRACTION, BILATERAL    . COLONOSCOPY    . COLONOSCOPY WITH PROPOFOL N/A 04/30/2016   Procedure: COLONOSCOPY WITH PROPOFOL;  Surgeon: Daneil Dolin, MD;  Location: [facility];  Service: Endoscopy;  Laterality: N/A;  1230  . ESOPHAGOGASTRODUODENOSCOPY     approximately 2010  . ESOPHAGOGASTRODUODENOSCOPY (EGD) WITH PROPOFOL N/A 04/30/2016   Procedure: ESOPHAGOGASTRODUODENOSCOPY (EGD) WITH PROPOFOL;  Surgeon: Daneil Dolin, MD;  Location: [facility];  Service: Endoscopy;  Laterality: N/A;  . LEFT HEART CATHETERIZATION WITH CORONARY ANGIOGRAM N/A 10/29/2014   07-14-20- pt denies this Procedure: LEFT HEART CATHETERIZATION WITH CORONARY ANGIOGRAM;  Surgeon: Burnell Blanks, MD;  Location: [facility] CATH LAB;  Service: Cardiovascular;  Laterality: N/A;  . Venia Minks DILATION N/A 04/30/2016   Procedure: Venia Minks DILATION;  Surgeon: Daneil Dolin, MD;  Location: [facility];  Service: Endoscopy;  Laterality: N/A;  . POLYPECTOMY  04/30/2016  Procedure: POLYPECTOMY;  Surgeon: Daneil Dolin, MD;  Location: [facility];  Service: Endoscopy;;  Sigmoid colon polyp removed via hot snare  . UPPER GASTROINTESTINAL ENDOSCOPY       OB History   No obstetric history on file.     Family History  Problem Relation Age of Onset  . Heart failure Mother   . Heart attack Mother   . Heart disease  Mother   . Hypertension Mother   . Hyperlipidemia Mother   . CVA Father   . Alzheimer's disease Father   . Cancer Neg Hx   . Colon cancer Neg Hx   . Esophageal cancer Neg Hx   . Rectal cancer Neg Hx   . Stomach cancer Neg Hx     Social History   Tobacco Use  . Smoking status: Current Every Day Smoker    Packs/day: 0.50    Years: 40.00    Pack years: 20.00    Types: Cigarettes    Start date: 11/12/1968  . Smokeless tobacco: Never Used  . Tobacco comment: cutting back, wearing patches  Vaping Use  . Vaping Use: Never used  Substance Use Topics  . Alcohol use: Not Currently    Alcohol/week: 0.0 standard drinks    Comment: beer on weekends (6-pack)  . Drug use: No    Home Medications Prior to Admission medications   Medication Sig Start Date End Date Taking? Authorizing Provider  albuterol (VENTOLIN HFA) 108 (90 Base) MCG/ACT inhaler Inhale 2 puffs into the lungs every 6 (six) hours as needed for shortness of breath or wheezing. 12/14/20   [provider]  busPIRone (BUSPAR) 15 MG tablet Take 1 tablet (15 mg total) by mouth 2 (two) times daily. Patient not taking: No sig reported 08/13/18   Argentina Donovan, PA-C  cyclobenzaprine (FLEXERIL) 10 MG tablet Take 10 mg by mouth daily as needed (Back pain). 10/08/20   [provider]  diltiazem (CARDIZEM CD) 180 MG 24 hr capsule TAKE 1 CAPSULE BY MOUTH DAILY. Patient not taking: No sig reported 02/04/19   Charlott Rakes, MD  diltiazem (TIAZAC) 180 MG 24 hr capsule Take 180 mg by mouth daily. 11/09/20   [provider]  escitalopram (LEXAPRO) 20 MG tablet Take 20 mg by mouth daily. 11/23/20   [provider]  famotidine (PEPCID) 40 MG tablet Take 40 mg by mouth daily. 10/25/20   [provider]  mirtazapine (REMERON) 30 MG tablet Take 0.5 tablets (15 mg total) by mouth at bedtime. Patient taking differently: Take 30 mg by mouth at bedtime. 08/13/18   Argentina Donovan, PA-C  pantoprazole  (PROTONIX) 40 MG tablet Take 1 tablet (40 mg total) by mouth daily. 08/13/18   Argentina Donovan, PA-C    Allergies    Patient has no known allergies.  Review of Systems   Review of Systems  Constitutional: Negative for fever.  HENT: Negative for congestion and sore throat.   Eyes: Negative for visual disturbance.  Respiratory: Negative for cough and shortness of breath.   Cardiovascular: Negative for chest pain.  Gastrointestinal: Negative for abdominal pain, diarrhea, nausea and vomiting.  Genitourinary: Negative for dysuria.  Musculoskeletal: Positive for myalgias. Negative for gait problem, neck pain and neck stiffness.  Skin: Negative for rash.  Neurological: Positive for speech difficulty. Negative for dizziness, syncope, facial asymmetry, weakness, light-headedness, numbness and headaches.  Psychiatric/Behavioral: Negative for confusion.    Physical Exam Updated Vital Signs BP (!) 165/97 (BP Location: Right Arm)  Pulse (!) 104   Temp 98.1 F (36.7 C) (Oral)   Resp 18   SpO2 96%   Physical Exam Vitals and nursing note reviewed.  Constitutional:      Appearance: She is well-developed and well-nourished.  HENT:     Head: Normocephalic and atraumatic.     Right Ear: Tympanic membrane, ear canal and external ear normal.     Left Ear: Tympanic membrane, ear canal and external ear normal.     Nose: Nose normal.     Mouth/Throat:     Mouth: Oropharynx is clear and moist and mucous membranes are normal.     Pharynx: Uvula midline.  Eyes:     General: Lids are normal.     Extraocular Movements: EOM normal.     Right eye: No nystagmus.     Left eye: No nystagmus.     Conjunctiva/sclera: Conjunctivae normal.     Pupils: Pupils are equal, round, and reactive to light.  Cardiovascular:     Rate and Rhythm: Normal rate and regular rhythm.     Pulses:          Posterior tibial pulses are 2+ on the right side and 1+ on the left side.  Pulmonary:     Effort: Pulmonary  effort is normal.     Breath sounds: Normal breath sounds.  Abdominal:     Palpations: Abdomen is soft.     Tenderness: There is no abdominal tenderness.  Musculoskeletal:     Cervical back: Normal range of motion and neck supple. No tenderness or bony tenderness. Normal range of motion.  Skin:    General: Skin is warm and dry.  Neurological:     Mental Status: She is alert and oriented to person, place, and time.     GCS: GCS eye subscore is 4. GCS verbal subscore is 5. GCS motor subscore is 6.     Cranial Nerves: Dysarthria present. No cranial nerve deficit or facial asymmetry.     Sensory: No sensory deficit.     Motor: No weakness.     Coordination: She displays a negative Romberg sign. Coordination normal.     Gait: Gait normal.     Deep Tendon Reflexes: Strength normal.     Comments: Patient with stuttering and difficulty getting out words, at times.  No word substitutions noted.  Psychiatric:        Mood and Affect: Mood and affect normal.     ED Results / Procedures / Treatments   Labs (all labs ordered are listed, but only abnormal results are displayed) Labs Reviewed  CBC - Abnormal; Notable for the following components:      Result Value   MCV 101.4 (*)    All other components within normal limits  COMPREHENSIVE METABOLIC PANEL - Abnormal; Notable for the following components:   Glucose, Bld 105 (*)    All other components within normal limits  URINALYSIS, ROUTINE W REFLEX MICROSCOPIC - Abnormal; Notable for the following components:   Color, Urine STRAW (*)    Specific Gravity, Urine 1.004 (*)    Hgb urine dipstick SMALL (*)    Bacteria, UA RARE (*)    All other components within normal limits  RESP PANEL BY RT-PCR (FLU A&B, COVID) ARPGX2  ETHANOL  PROTIME-INR  APTT  DIFFERENTIAL  RAPID URINE DRUG SCREEN, HOSP PERFORMED    ED ECG REPORT   Date: 12/29/2020  Rate: 88  Rhythm: normal sinus rhythm  QRS Axis: normal  Intervals:  normal  ST/T Wave  abnormalities: nonspecific T wave changes  Conduction Disutrbances:none  Narrative Interpretation:   Old EKG Reviewed: unchanged  I have personally reviewed the EKG tracing and agree with the computerized printout as noted.  Radiology CT HEAD WO CONTRAST  Result Date: 12/29/2020 CLINICAL DATA:  Dysarthria/slurred speech EXAM: CT HEAD WITHOUT CONTRAST TECHNIQUE: Contiguous axial images were obtained from the base of the skull through the vertex without intravenous contrast. COMPARISON:  December 25, 2007 FINDINGS: Brain: There is slight diffuse atrophy. Prominence of the cisterna magna is an anatomic variant. There is no intracranial mass, hemorrhage, extra-axial fluid collection, or midline shift. There is an age uncertain infarct involving the anterior limb of the right external capsule. There is decreased attenuation involving portions of the genu and immediately adjacent portions of the anterior and posterior limbs of the left internal capsule which appears acute. There is nearby decreased attenuation anterior superior left thalamus concerning for recent infarct in this area as well. Elsewhere, there is slight periventricular small vessel disease. Vascular: There is no appreciable hyperdense vessel. No appreciable vascular calcification. Skull: Bony calvarium appears intact. Sinuses/Orbits: Visualized paranasal sinuses are clear. Orbits appear symmetric bilaterally. Suspect mild drusen on each side. Other: Mastoid air cells are clear. IMPRESSION: 1. Suspect recent infarct involving a portion of the right internal capsule and immediately adjacent superior anterior right thalamus. Question older infarct involving the anterior limb of the right external capsule. 2. Slight periventricular small vessel disease. Slight diffuse atrophy. 3. No demonstrable mass or hemorrhage. Electronically Signed   By: Lowella Grip III M.D.   On: 12/29/2020 15:03    Procedures Procedures   Medications Ordered in  ED Medications - No data to display  ED Course  I have reviewed the triage vital signs and the nursing notes.  Pertinent labs & imaging results that were available during my care of the patient were reviewed by me and considered in my medical decision making (see chart for details).  Patient seen and examined. Work-up initiated. BP elevation noted.   Vital signs reviewed and are as follows: BP (!) 165/97 (BP Location: Right Arm)   Pulse (!) 104   Temp 98.1 F (36.7 C) (Oral)   Resp 18   SpO2 96%   3:33 PM Spoke with Dr. Rory Percy of neurology. Discussed case. Requests MRI prior to admission.   5:02 PM MRI demonstrates acute CVA.  Discussed with Dr. Rory Percy.  Neurology will consult.  He request patient be transferred to [facility].  States that consult will likely take place when patient arrives to [facility] or if nighttime neurology MD able to get to [facility].  5:13 PM Spoke with Dr. Nevada Crane who will see patient.     MDM Rules/Calculators/A&P                          Admit.    Final Clinical Impression(s) / ED Diagnoses Final diagnoses:  Acute CVA (cerebrovascular accident) [facility])    Rx / [place] Orders ED Discharge Orders    None       Carlisle Cater, PA-C 12/29/20 1714    Gareth Morgan, MD 12/30/20 [phone]

## 2020-12-29 NOTE — Telephone Encounter (Signed)
Pt contacted pre-abdominal aortogram scheduled at [facility] for: Monday January 02, 2021 7:30 AM Verified arrival time and place: [facility]) at: 5:30 AM   No solid food after midnight prior to cath, clear liquids until 5 AM day of procedure.  AM meds can be  taken pre-cath with sips of water including: ASA 81 mg   Confirmed patient has responsible adult to drive home post procedure and be with patient first 24 hours after arriving home:  You are allowed ONE visitor in the waiting room during the time you are at the hospital for your procedure. Both you and your visitor must wear a mask once you enter the hospital.   [facility] to review procedure instructions with patient.

## 2020-12-29 NOTE — Telephone Encounter (Signed)
Voicemail message, no answer. 

## 2020-12-29 NOTE — Plan of Care (Signed)
  Problem: Education: Goal: Knowledge of General Education information will improve Description: Including pain rating scale, medication(s)/side effects and non-pharmacologic comfort measures 12/29/2020 2348 by Durwin Reges, RN Outcome: Progressing 12/29/2020 2346 by Durwin Reges, RN Outcome: Progressing   Problem: Health Behavior/Discharge Planning: Goal: Ability to manage health-related needs will improve 12/29/2020 2348 by Turhan Chill, Pamalee Leyden, RN Outcome: Progressing 12/29/2020 2346 by Durwin Reges, RN Outcome: Progressing   Problem: Clinical Measurements: Goal: Ability to maintain clinical measurements within normal limits will improve 12/29/2020 2348 by Tashia Leiterman, Pamalee Leyden, RN Outcome: Progressing 12/29/2020 2346 by Durwin Reges, RN Outcome: Progressing Goal: Will remain free from infection 12/29/2020 2348 by Durwin Reges, RN Outcome: Progressing 12/29/2020 2346 by Durwin Reges, RN Outcome: Progressing Goal: Diagnostic test results will improve 12/29/2020 2348 by Durwin Reges, RN Outcome: Progressing 12/29/2020 2346 by Durwin Reges, RN Outcome: Progressing Goal: Respiratory complications will improve 12/29/2020 2348 by Durwin Reges, RN Outcome: Progressing 12/29/2020 2346 by Durwin Reges, RN Outcome: Progressing Goal: Cardiovascular complication will be avoided 12/29/2020 2348 by Durwin Reges, RN Outcome: Progressing 12/29/2020 2346 by Durwin Reges, RN Outcome: Progressing   Problem: Activity: Goal: Risk for activity intolerance will decrease 12/29/2020 2348 by Prakriti Carignan, Pamalee Leyden, RN Outcome: Progressing 12/29/2020 2346 by Durwin Reges, RN Outcome: Progressing   Problem: Nutrition: Goal: Adequate nutrition will be maintained 12/29/2020 2348 by Durwin Reges, RN Outcome: Progressing 12/29/2020 2346 by Durwin Reges, RN Outcome: Progressing   Problem: Coping: Goal: Level of anxiety will decrease 12/29/2020 2348 by Durwin Reges, RN Outcome: Progressing 12/29/2020 2346 by Durwin Reges, RN Outcome: Progressing   Problem: Elimination: Goal: Will not experience complications related to bowel motility 12/29/2020 2348 by Durwin Reges, RN Outcome: Progressing 12/29/2020 2346 by Durwin Reges, RN Outcome: Progressing Goal: Will not experience complications related to urinary retention 12/29/2020 2348 by Durwin Reges, RN Outcome: Progressing 12/29/2020 2346 by Durwin Reges, RN Outcome: Progressing   Problem: Pain Managment: Goal: General experience of comfort will improve 12/29/2020 2348 by Durwin Reges, RN Outcome: Progressing 12/29/2020 2346 by Durwin Reges, RN Outcome: Progressing   Problem: Safety: Goal: Ability to remain free from injury will improve 12/29/2020 2348 by Dola Lunsford, Pamalee Leyden, RN Outcome: Progressing 12/29/2020 2346 by Durwin Reges, RN Outcome: Progressing   Problem: Skin Integrity: Goal: Risk for impaired skin integrity will decrease 12/29/2020 2348 by Durwin Reges, RN Outcome: Progressing 12/29/2020 2346 by Durwin Reges, RN Outcome: Progressing   Problem: Education: Goal: Knowledge of disease or condition will improve Outcome: Progressing Goal: Knowledge of secondary prevention will improve Outcome: Progressing Goal: Knowledge of patient specific risk factors addressed and post discharge goals established will improve Outcome: Progressing Goal: Individualized Educational Video(s) Outcome: Progressing   Problem: Coping: Goal: Will verbalize positive feelings about self Outcome: Progressing Goal: Will identify appropriate support needs Outcome: Progressing   Problem: Health Behavior/Discharge Planning: Goal: Ability to manage health-related needs will improve Outcome: Progressing   Problem: Nutrition: Goal: Risk of aspiration will decrease Outcome: Progressing Goal: Dietary intake will improve Outcome: Progressing

## 2020-12-29 NOTE — ED Triage Notes (Signed)
Pt reports slurred speech that began 2-3 days ago. Pt denies dizziness, unilateral weakness, and headache. Bilateral grips, strength, and sensation are equal. No facial droop present. A&O x4.

## 2020-12-29 NOTE — Telephone Encounter (Signed)
Pt currently in [facility] ED

## 2020-12-30 ENCOUNTER — Inpatient Hospital Stay (HOSPITAL_COMMUNITY): Payer: Medicare HMO

## 2020-12-30 ENCOUNTER — Other Ambulatory Visit (HOSPITAL_COMMUNITY): Payer: Medicare HMO

## 2020-12-30 DIAGNOSIS — I10 Essential (primary) hypertension: Secondary | ICD-10-CM

## 2020-12-30 DIAGNOSIS — Z8719 Personal history of other diseases of the digestive system: Secondary | ICD-10-CM

## 2020-12-30 DIAGNOSIS — F1010 Alcohol abuse, uncomplicated: Secondary | ICD-10-CM | POA: Diagnosis not present

## 2020-12-30 DIAGNOSIS — I639 Cerebral infarction, unspecified: Secondary | ICD-10-CM | POA: Diagnosis not present

## 2020-12-30 DIAGNOSIS — B182 Chronic viral hepatitis C: Secondary | ICD-10-CM | POA: Diagnosis not present

## 2020-12-30 DIAGNOSIS — I739 Peripheral vascular disease, unspecified: Secondary | ICD-10-CM

## 2020-12-30 DIAGNOSIS — F411 Generalized anxiety disorder: Secondary | ICD-10-CM | POA: Diagnosis not present

## 2020-12-30 DIAGNOSIS — I6389 Other cerebral infarction: Secondary | ICD-10-CM | POA: Diagnosis not present

## 2020-12-30 DIAGNOSIS — F17200 Nicotine dependence, unspecified, uncomplicated: Secondary | ICD-10-CM

## 2020-12-30 LAB — BASIC METABOLIC PANEL
Anion gap: 9 (ref 5–15)
BUN: 7 mg/dL — ABNORMAL LOW (ref 8–23)
CO2: 22 mmol/L (ref 22–32)
Calcium: 8.9 mg/dL (ref 8.9–10.3)
Chloride: 106 mmol/L (ref 98–111)
Creatinine, Ser: 0.94 mg/dL (ref 0.44–1.00)
GFR, Estimated: >60 mL/min (ref >60)
Glucose, Bld: 92 mg/dL (ref 70–99)
Potassium: 3.6 mmol/L (ref 3.5–5.1)
Sodium: 137 mmol/L (ref 135–145)

## 2020-12-30 LAB — ECHOCARDIOGRAM COMPLETE BUBBLE STUDY
AR max vel: 2.06 cm2
AV Area VTI: 2.61 cm2
AV Area mean vel: 2.12 cm2
AV Mean grad: 7 mmHg
AV Peak grad: 11 mmHg
Ao pk vel: 1.66 m/s
Area-P 1/2: 3.74 cm2
MV VTI: 2.36 cm2
S' Lateral: 2.7 cm

## 2020-12-30 LAB — CBC
HCT: 40.3 % (ref 36.0–46.0)
Hemoglobin: 13.9 g/dL (ref 12.0–15.0)
MCH: 34.1 pg — ABNORMAL HIGH (ref 26.0–34.0)
MCHC: 34.5 g/dL (ref 30.0–36.0)
MCV: 98.8 fL (ref 80.0–100.0)
Platelets: 272 10*3/uL (ref 150–400)
RBC: 4.08 MIL/uL (ref 3.87–5.11)
RDW: 13.5 % (ref 11.5–15.5)
WBC: 9.7 10*3/uL (ref 4.0–10.5)
nRBC: 0 % (ref 0.0–0.2)

## 2020-12-30 LAB — MAGNESIUM: Magnesium: 2.5 mg/dL — ABNORMAL HIGH (ref 1.7–2.4)

## 2020-12-30 MED ORDER — CLOPIDOGREL BISULFATE 75 MG PO TABS
75.0000 mg | ORAL_TABLET | Freq: Every day | ORAL | Status: DC
  Administered 2020-12-30 – 2020-12-31 (×2): 75 mg via ORAL
  Filled 2020-12-30 (×2): qty 1

## 2020-12-30 MED ORDER — LABETALOL HCL 5 MG/ML IV SOLN: 5.0000 mg | INTRAVENOUS | Status: DC | PRN

## 2020-12-30 MED ORDER — NICOTINE 21 MG/24HR TD PT24
21.0000 mg | MEDICATED_PATCH | Freq: Every day | TRANSDERMAL | Status: DC
  Administered 2020-12-30 – 2020-12-31 (×2): 21 mg via TRANSDERMAL
  Filled 2020-12-30 (×2): qty 1

## 2020-12-30 MED ORDER — ASPIRIN EC 325 MG PO TBEC: 325.0000 mg | DELAYED_RELEASE_TABLET | Freq: Every day | ORAL | Status: DC

## 2020-12-30 MED ORDER — ASPIRIN 81 MG PO CHEW: 324.0000 mg | CHEWABLE_TABLET | Freq: Every day | ORAL | Status: DC

## 2020-12-30 MED ORDER — ASPIRIN EC 81 MG PO TBEC
81.0000 mg | DELAYED_RELEASE_TABLET | Freq: Every day | ORAL | Status: DC
  Administered 2020-12-30 – 2020-12-31 (×2): 81 mg via ORAL
  Filled 2020-12-30 (×2): qty 1

## 2020-12-30 MED ORDER — LORAZEPAM 2 MG/ML IJ SOLN
0.5000 mg | Freq: Once | INTRAMUSCULAR | Status: AC
  Administered 2020-12-30: 0.5 mg via INTRAVENOUS
  Filled 2020-12-30: qty 1

## 2020-12-30 NOTE — CV Procedure (Signed)
Echocardiogram not completed, patient is going for a swallow test. Will re-attempt later this afternoon.  Kara Bailey

## 2020-12-30 NOTE — Evaluation (Signed)
Physical Therapy Evaluation Patient Details Name: Kara Bailey MRN: [id number] DOB: [birth year] Today's Date: 12/30/2020   History of Present Illness  65 y.o. female with a history of smoking, peripheral artery disease, hypertension who presents with right-sided weakness and difficulty speaking that has been going on since waking up on Thursday. MRI demonstrated small thalamic infarct. Pt also reports recent hematemesis.  Clinical Impression  Pt presents to PT with deficits in gait, strength, power, communication, balance, endurance. Pt is mildly unsteady and prefers to ambulate with UE support of RW to improve balance. Ptalso demonstrates some dysarthria and word finding difficulties at times during session. Pt will benefit from aggressive mobilization and acute PT POC to improve mobility quality and reduce falls risk. PT recommends further gait training with use of 4 wheeled walker.    Follow Up Recommendations Home health PT;Supervision - Intermittent    Equipment Recommendations   (4 wheeled walker)    Recommendations for Other Services       Precautions / Restrictions Precautions Precautions: Fall Restrictions Weight Bearing Restrictions: No      Mobility  Bed Mobility               General bed mobility comments: pt received and left sitting in recliner    Transfers Overall transfer level: Needs assistance Equipment used: None Transfers: Sit to/from Stand Sit to Stand: Supervision            Ambulation/Gait Ambulation/Gait assistance: Supervision;Min guard Gait Distance (Feet): 100 Feet Assistive device: Rolling walker (2 wheeled);None Gait Pattern/deviations: Step-through pattern Gait velocity: reduced Gait velocity interpretation: <1.8 ft/sec, indicate of risk for recurrent falls General Gait Details: pt with slowed step-through gait, pt ambulates 15' without device with increased sway and reduced gait speed. Pt then ambulates with increased step  length and reduced lateral sway with support of RW  Stairs            Wheelchair Mobility    Modified Rankin (Stroke Patients Only) Modified Rankin (Stroke Patients Only) Pre-Morbid Rankin Score: No symptoms Modified Rankin: Moderately severe disability     Balance Overall balance assessment: Needs assistance Sitting-balance support: No upper extremity supported;Feet supported Sitting balance-Leahy Scale: Good     Standing balance support: No upper extremity supported;During functional activity Standing balance-Leahy Scale: Fair                               Pertinent Vitals/Pain Pain Assessment: No/denies pain    Home Living Family/patient expects to be discharged to:: Private residence Living Arrangements: Spouse/significant other Available Help at Discharge: Family;Available 24 hours/day (supervision only from significant other) Type of Home: House Home Access: Level entry     Home Layout: One level Home Equipment: None (significant other has 4 wheeled walker but he utilizes it)      Prior Function Level of Independence: Independent         Comments: ambulating community distances     Hand Dominance        Extremity/Trunk Assessment   Upper Extremity Assessment Upper Extremity Assessment: Overall WFL for tasks assessed    Lower Extremity Assessment Lower Extremity Assessment: Generalized weakness (grossly 4/5 BLE)    Cervical / Trunk Assessment Cervical / Trunk Assessment: Normal  Communication   Communication: Expressive difficulties (dysarthria)  Cognition Arousal/Alertness: Awake/alert Behavior During Therapy: WFL for tasks assessed/performed Overall Cognitive Status: Within Functional Limits for tasks assessed  General Comments General comments (skin integrity, edema, etc.): VSS on RA    Exercises     Assessment/Plan    PT Assessment Patient needs continued PT  services  PT Problem List Decreased strength;Decreased activity tolerance;Decreased balance;Decreased mobility;Decreased knowledge of use of DME;Decreased safety awareness;Decreased knowledge of precautions       PT Treatment Interventions DME instruction;Gait training;Functional mobility training;Therapeutic activities;Balance training;Neuromuscular re-education;Patient/family education    PT Goals (Current goals can be found in the Care Plan section)  Acute Rehab PT Goals Patient Stated Goal: to go home to help care for significant other PT Goal Formulation: With patient Time For Goal Achievement: 01/13/21 Potential to Achieve Goals: Good Additional Goals Additional Goal #1: Pt will score >45/56 on BERG to indicate a reduced risk for falls    Frequency Min 4X/week   Barriers to discharge        Co-evaluation               AM-PAC PT "6 Clicks" Mobility  Outcome Measure Help needed turning from your back to your side while in a flat bed without using bedrails?: A Little Help needed moving from lying on your back to sitting on the side of a flat bed without using bedrails?: A Little Help needed moving to and from a bed to a chair (including a wheelchair)?: A Little Help needed standing up from a chair using your arms (e.g., wheelchair or bedside chair)?: A Little Help needed to walk in hospital room?: A Little Help needed climbing 3-5 steps with a railing? : A Lot 6 Click Score: 17    End of Session   Activity Tolerance: Patient tolerated treatment well Patient left: in chair;with call bell/phone within reach Nurse Communication: Mobility status PT Visit Diagnosis: Unsteadiness on feet (R26.81);Muscle weakness (generalized) (M62.81)    Time: 4818-5631 PT Time Calculation (min) (ACUTE ONLY): 23 min   Charges:   PT Evaluation $PT Eval Low Complexity: 1 Low          Zenaida Niece, PT, DPT Acute Rehabilitation Pager: [phone]   Zenaida Niece 12/30/2020, 12:08  PM

## 2020-12-30 NOTE — Evaluation (Signed)
Clinical/Bedside Swallow Evaluation Patient Details  Name: Kara Bailey MRN: [id number] Date of Birth: February 16, 1956  Today's Date: 12/30/2020 Time: SLP Start Time (ACUTE ONLY): 1134 SLP Stop Time (ACUTE ONLY): 1150 SLP Time Calculation (min) (ACUTE ONLY): 16 min  Past Medical History:  Past Medical History:  Diagnosis Date  . Anxiety   . Chest pain 08/2014   unspecified  . Cirrhosis ([place])   . Depression   . Dizziness 08/2014  . Dyspnea 08/2014  . Emphysema lung ([place])   . GERD (gastroesophageal reflux disease)   . Hepatitis C   . Hepatitis C virus infection   . Hypertension   . PAC (premature atrial contraction) 10/11/2014  . PAD (peripheral artery disease) ([place])   . Palpitations   . PVC (premature ventricular contraction) 10/11/2014  . Shingles    left side  . 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 Tachycardia 08/2014  . Ventral hernia   . Weakness 08/2014   Past Surgical History:  Past Surgical History:  Procedure Laterality Date  . APPENDECTOMY    . CATARACT EXTRACTION, BILATERAL    . COLONOSCOPY    . COLONOSCOPY WITH PROPOFOL N/A 04/30/2016   Procedure: COLONOSCOPY WITH PROPOFOL;  Surgeon: Daneil Dolin, MD;  [facility]: [facility];  Service: Endoscopy;  Laterality: N/A;  1230  . ESOPHAGOGASTRODUODENOSCOPY     approximately 2010  . ESOPHAGOGASTRODUODENOSCOPY (EGD) WITH PROPOFOL N/A 04/30/2016   Procedure: ESOPHAGOGASTRODUODENOSCOPY (EGD) WITH PROPOFOL;  Surgeon: Daneil Dolin, MD;  Location: [facility];  Service: Endoscopy;  Laterality: N/A;  . LEFT HEART CATHETERIZATION WITH CORONARY ANGIOGRAM N/A 10/29/2014   07-14-20- pt denies this Procedure: LEFT HEART CATHETERIZATION WITH CORONARY ANGIOGRAM;  Surgeon: Burnell Blanks, MD;  Location: [facility] CATH LAB;  Service: Cardiovascular;  Laterality: N/A;  . Venia Minks DILATION N/A 04/30/2016   Procedure: Venia Minks DILATION;  Surgeon: Daneil Dolin, MD;  Location: [facility];  Service: Endoscopy;   Laterality: N/A;  . POLYPECTOMY  04/30/2016   Procedure: POLYPECTOMY;  Surgeon: Daneil Dolin, MD;  Location: [facility];  Service: Endoscopy;;  Sigmoid colon polyp removed via hot snare  . UPPER GASTROINTESTINAL ENDOSCOPY     HPI:  Pt is a 65 y.o. female with medical history significant for peripheral vascular disease followed by cardiology, essential hypertension, hyperlipidemia, chronic anxiety/depression, GERD, LA grade A esophagitis, esophageal stenosis postop dilatation, tobacco use disorder, alcohol use disorder, who presented to [facility] ED due to speech difficulty of 3 days duration and difficulty with swallowing. MRI brain 2/17: . 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 Pt failed the [facility] swallow screen due to coughing.   Assessment / Plan / Recommendation Clinical Impression  Pt was seen for bedside swallow evaluation and she denied a history of oropharyngeal dysphagia, but reported prior esophageal dilations. Oral mechanism exam was significant for reduced facial strength, as well as reduced lingual strength and ROM. She was edentulous but denied difficulty with regular texture solids. She tolerated solids without overt s/sx of aspiration, but exhibited coughing with thin liquids via cup and straw. Mastication was prolonged secondary edentulous status and mild to moderate oral residue was cleared with a liquid wash. A modified barium swallow study is recommended to further assess swallow function and it is currently scheduled for today at 1300. SLP Visit Diagnosis: Dysphagia, unspecified (R13.10)    Aspiration Risk  Mild aspiration risk    Diet Recommendation NPO  Other  Recommendations Oral Care Recommendations: Oral care BID   Follow up Recommendations Home health SLP      Frequency and Duration min 2x/week  2 weeks       Prognosis Prognosis for Safe Diet Advancement: Good      Swallow Study   General Date of  Onset: 12/29/20 HPI: Pt is a 65 y.o. female with medical history significant for peripheral vascular disease followed by cardiology, essential hypertension, hyperlipidemia, chronic anxiety/depression, GERD, LA grade A esophagitis, esophageal stenosis postop dilatation, tobacco use disorder, alcohol use disorder, who presented to [facility] ED due to speech difficulty of 3 days duration and difficulty with swallowing. MRI brain 2/17: . 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 Pt failed the [facility] swallow screen due to coughing. Type of Study: Bedside Swallow Evaluation Previous Swallow Assessment: None Diet Prior to this Study: NPO Temperature Spikes Noted: No Respiratory Status: Room air History of Recent Intubation: No Behavior/Cognition: Alert;Cooperative;Pleasant mood Oral Cavity Assessment: Within Functional Limits Oral Care Completed by SLP: No Oral Cavity - Dentition: Edentulous Vision: Functional for self-feeding Self-Feeding Abilities: Able to feed self Patient Positioning: Upright in bed Baseline Vocal Quality: Low vocal intensity Volitional Cough: Strong Volitional Swallow: Able to elicit    Oral/Motor/Sensory Function Overall Oral Motor/Sensory Function: Mild impairment Facial ROM: Reduced right;Suspected CN VII (facial) dysfunction Facial Symmetry: Abnormal symmetry right;Suspected CN VII (facial) dysfunction Facial Strength: Within Functional Limits Facial Sensation: Within Functional Limits Lingual ROM: Reduced right;Suspected CN XII (hypoglossal) dysfunction Lingual Symmetry: Abnormal symmetry right;Suspected CN XII (hypoglossal) dysfunction Lingual Strength: Reduced;Suspected CN XII (hypoglossal) dysfunction Lingual Sensation: Within Functional Limits   Ice Chips Ice chips: Within functional limits Presentation: Spoon   Thin Liquid Thin Liquid: Impaired Presentation: Straw;Cup Pharyngeal  Phase Impairments: Throat Clearing -  Immediate;Cough - Immediate;Cough - Delayed    Nectar Thick Nectar Thick Liquid: Not tested   Honey Thick Honey Thick Liquid: Not tested   Puree Puree: Within functional limits Presentation: Spoon   Solid     Solid: Impaired Presentation: Self Fed Oral Phase Impairments: Impaired mastication Oral Phase Functional Implications: Oral residue     Lynnzie Blackson I. Hardin Negus, [place], [place] Office number [phone] Pager [phone]  Horton Marshall 12/30/2020,1:37 PM

## 2020-12-30 NOTE — Progress Notes (Signed)
Modified Barium Swallow Progress Note  Patient Details  Name: Kara Bailey MRN: [id number] Date of Birth: [birth year]  Today's Date: 12/30/2020  Modified Barium Swallow completed.  Full report located under Chart Review in the Imaging Section.  Brief recommendations include the following:  Clinical Impression  Pt presents with oropharyngeal dysphagia characterized by decreased bolus cohesion, weak lingual manipulation, and a pharyngeal delay. She demonstrated premature spillage to the pyriform sinuses with thin liquids, difficulty with A-P transit, intermittent lingual pumping, and base of tongue residue with solids. Penetration (PAS 3) was observed when pt was cued to consume larger boluses and with consecutive swallows. Aspiration (PAS 7) was noted once when pt unsuccessfully attempted to swallow a pill with thin liquids. A dysphagia 3 diet is recommended at this time. Though aspiration was only noted once during this study, considering intermittent penetration and her presentation during at bedside with frequent coughing with thin liquids, nectar thick liquids are recommended at this time. SLP will follow for dysphagia treatment.   Swallow Evaluation Recommendations       SLP Diet Recommendations: Dysphagia 3 (Mech soft) solids;Nectar thick liquid   Liquid Administration via: Cup;Straw   Medication Administration: Whole meds with puree   Supervision: Staff to assist with self feeding   Compensations: Slow rate;Small sips/bites   Postural Changes: Remain semi-upright after after feeds/meals (Comment);Seated upright at 90 degrees          Kara Bailey I. Kara Bailey, [place], [place] Office number [phone] Pager [phone]   Horton Marshall 12/30/2020,3:35 PM

## 2020-12-30 NOTE — Progress Notes (Signed)
PROGRESS NOTE    MYRELLA FAHS   BWL:[id number]  DOB: 01/23/1956  DOA: 12/29/2020 PCP: Andree Moro, DO   Brief Narrative:  ZYKIRIA BRUENING is a 66 year old female with peripheral vascular disease, essential hypertension, hyperlipidemia, gastroesophageal reflux disease, LA grade a esophagitis, esophagus stenosis post dilatation, tobacco abuse, alcohol use and anxiety and depression who presents to the hospital for speech difficulty which started 3 days before and is associated with difficulty swallowing.  Her primary care provider advised her to come to the hospital. In the ED:  CT of the head without contrast revealed a recent infarct involving a portion of the right internal capsule and immediately adjacent superior anterior right thalamus with a questionable old or infarct involving the anterior limb of the right external capsule.  MRI of the brain without contrast: 8 mm acute infarct in the left anterolateral thalamus/posterior limb internal capsule and extensive chronic small vessel changes-there were old hemorrhaged in the right superior cerebellum right basal ganglia and radiating white matter tracts   Subjective: Patient is having pain in the right upper abdomen just below her breast.  She thinks it is hunger pain because she has not eaten as she failed her swallow eval.    Assessment & Plan:   Principal Problem:   Acute CVA (cerebrovascular accident)  -Ischemic infarct in the right internal capsule and superior anterior right thalamus -The patient has been having trouble with dysphagia and slurred speech -She is currently n.p.o. until evaluated by speech therapy -LDL is 71 -Hemoglobin A1c is 5.5 -Follow-up stroke work-up including 2D echo and carotid eval  Active Problems: Left upper abdominal pain -She is slightly tender in the left upper abdomen-hold off on imaging and follow for now  Alcohol abuse -She drinks at least 2 to 3 cans of beer a day -CIWA scale  ordered  Nicotine abuse -NicoDerm patch ordered    Chronic hepatitis C without hepatic coma    Generalized anxiety disorder -She takes BuSpar and E citalopram at home which are on hold until assessment is done for dysphagia -I have ordered a double one-time small dose of IV Ativan    History of esophageal stricture -Continue with PPI daily    Peripheral arterial disease (HCC) -We will resume Lipitor and a platelet inhibitor once able to swallow  Time spent in minutes: 40 minutes DVT prophylaxis: Place and maintain sequential compression device Start: 12/29/20 1825  Code Status: Full code Family Communication:  Level of Care: Level of care: Telemetry Medical Disposition Plan:  Status is: Inpatient  Remains inpatient appropriate because:Inpatient level of care appropriate due to severity of illness   Dispo:  Patient From: Home  Planned Disposition: Home  Expected discharge date: 12/31/2020  Medically stable for discharge: No     Consultants:   Neurology Procedures:    Antimicrobials:  Anti-infectives (From admission, onward)   None       Objective: Vitals:   12/29/20 2335 12/29/20 2355 12/30/20 0355 12/30/20 0816  BP: (!) 151/81 (!) 155/79 (!) 144/79 (!) 177/92  Pulse: 87 88 86 91  Resp: (!) 22 12 14 16   Temp:    98.2 F (36.8 C)  TempSrc:    Oral  SpO2: 96% 96% 97% 98%    Intake/Output Summary (Last 24 hours) at 12/30/2020 1052 Last data filed at 12/30/2020 0345 Gross per 24 hour  Intake 42.03 ml  Output 500 ml  Net -457.97 ml   There were no vitals filed for this visit.  Examination:  General exam: Appears comfortable  HEENT: PERRLA, oral mucosa moist, no sclera icterus or thrush Respiratory system: Clear to auscultation. Respiratory effort normal. Cardiovascular system: S1 & S2 heard, RRR.   Gastrointestinal system: Abdomen soft, tenderness in left upper quadrant noted, nondistended. Normal bowel sounds. Central nervous system: Alert and  oriented.  She has slurred speech-moves all 4 extremities well Extremities: No cyanosis, clubbing or edema Skin: No rashes or ulcers Psychiatry: Appears anxious    Data Reviewed: I have personally reviewed following labs and imaging studies  CBC: Recent Labs  Lab 12/29/20 1247 12/29/20 2207 12/30/20 0400  WBC 8.8 9.7 9.7  NEUTROABS 6.4  --   --   HGB 14.4 14.4 13.9  HCT 43.2 42.8 40.3  MCV 101.4* 99.1 98.8  PLT 259 283 546   Basic Metabolic Panel: Recent Labs  Lab 12/29/20 1247 12/29/20 2207 12/30/20 0400  NA 137  --  137  K 3.6  --  3.6  CL 104  --  106  CO2 24  --  22  GLUCOSE 105*  --  92  BUN 10  --  7*  CREATININE 0.96  --  0.94  CALCIUM 9.1  --  8.9  MG  --  2.5* 2.5*  PHOS  --  2.9  --    GFR: Estimated Creatinine Clearance: 57.6 mL/min (by C-G formula based on SCr of 0.94 mg/dL). Liver Function Tests: Recent Labs  Lab 12/29/20 1247  AST 19  ALT 21  ALKPHOS 89  BILITOT 0.8  PROT 7.7  ALBUMIN 4.1   No results for input(s): LIPASE, AMYLASE in the last 168 hours. No results for input(s): AMMONIA in the last 168 hours. Coagulation Profile: Recent Labs  Lab 12/29/20 1247  INR 1.0   Cardiac Enzymes: No results for input(s): CKTOTAL, CKMB, CKMBINDEX, TROPONINI in the last 168 hours. BNP (last 3 results) No results for input(s): PROBNP in the last 8760 hours. HbA1C: Recent Labs    12/29/20 2207  HGBA1C 5.5   CBG: No results for input(s): GLUCAP in the last 168 hours. Lipid Profile: Recent Labs    12/29/20 2207  CHOL 113  HDL 41  LDLCALC 58  TRIG 71  CHOLHDL 2.8   Thyroid Function Tests: No results for input(s): TSH, T4TOTAL, FREET4, T3FREE, THYROIDAB in the last 72 hours. Anemia Panel: No results for input(s): VITAMINB12, FOLATE, FERRITIN, TIBC, IRON, RETICCTPCT in the last 72 hours. Urine analysis:    Component Value Date/Time   COLORURINE STRAW (A) 12/29/2020 1332   APPEARANCEUR CLEAR 12/29/2020 1332   LABSPEC 1.004 (L)  12/29/2020 1332   PHURINE 6.0 12/29/2020 1332   GLUCOSEU NEGATIVE 12/29/2020 1332   HGBUR SMALL (A) 12/29/2020 1332   BILIRUBINUR NEGATIVE 12/29/2020 1332   BILIRUBINUR small 06/15/2014 1038   KETONESUR NEGATIVE 12/29/2020 1332   PROTEINUR NEGATIVE 12/29/2020 1332   UROBILINOGEN 0.2 06/15/2014 1038   UROBILINOGEN 1.0 05/04/2013 1637   NITRITE NEGATIVE 12/29/2020 1332   LEUKOCYTESUR NEGATIVE 12/29/2020 1332   Sepsis Labs: @LABRCNTIP (procalcitonin:4,lacticidven:4) ) Recent Results (from the past 240 hour(s))  Resp Panel by RT-PCR (Flu A&B, Covid) Nasopharyngeal Swab     Status: None   Collection Time: 12/29/20 12:52 PM   Specimen: Nasopharyngeal Swab; Nasopharyngeal(NP) swabs in vial transport medium  Result Value Ref Range Status   SARS Coronavirus 2 by RT PCR NEGATIVE NEGATIVE Final    Comment: (NOTE) SARS-CoV-2 target nucleic acids are NOT DETECTED.  The SARS-CoV-2 RNA is generally detectable in upper respiratory specimens during  the acute phase of infection. The lowest concentration of SARS-CoV-2 viral copies this assay can detect is 138 copies/mL. A negative result does not preclude SARS-Cov-2 infection and should not be used as the sole basis for treatment or other patient management decisions. A negative result may occur with  improper specimen collection/handling, submission of specimen other than nasopharyngeal swab, presence of viral mutation(s) within the areas targeted by this assay, and inadequate number of viral copies(<138 copies/mL). A negative result must be combined with clinical observations, patient history, and epidemiological information. The expected result is Negative.  Fact Sheet for Patients:  EntrepreneurPulse.com.au  Fact Sheet for Healthcare Providers:  IncredibleEmployment.be  This test is no t yet approved or cleared by the [place] FDA and  has been authorized for detection and/or diagnosis of  SARS-CoV-2 by FDA under an Emergency Use Authorization (EUA). This EUA will remain  in effect (meaning this test can be used) for the duration of the COVID-19 declaration under Section 564(b)(1) of the Act, 21 U.S.C.section 360bbb-3(b)(1), unless the authorization is terminated  or revoked sooner.       Influenza A by PCR NEGATIVE NEGATIVE Final   Influenza B by PCR NEGATIVE NEGATIVE Final    Comment: (NOTE) The Xpert Xpress SARS-CoV-2/FLU/RSV plus assay is intended as an aid in the diagnosis of influenza from Nasopharyngeal swab specimens and should not be used as a sole basis for treatment. Nasal washings and aspirates are unacceptable for Xpert Xpress SARS-CoV-2/FLU/RSV testing.  Fact Sheet for Patients: EntrepreneurPulse.com.au  Fact Sheet for Healthcare Providers: IncredibleEmployment.be  This test is not yet approved or cleared by the [place] FDA and has been authorized for detection and/or diagnosis of SARS-CoV-2 by FDA under an Emergency Use Authorization (EUA). This EUA will remain in effect (meaning this test can be used) for the duration of the COVID-19 declaration under Section 564(b)(1) of the Act, 21 U.S.C. section 360bbb-3(b)(1), unless the authorization is terminated or revoked.  Performed at [facility], [place] [address] [place], [place] [address]          Radiology Studies: CT ANGIO HEAD W OR WO CONTRAST  Result Date: 12/29/2020 CLINICAL DATA:  Slurred speech EXAM: CT ANGIOGRAPHY HEAD AND NECK TECHNIQUE: Multidetector CT imaging of the head and neck was performed using the standard protocol during bolus administration of intravenous contrast. Multiplanar CT image reconstructions and MIPs were obtained to evaluate the vascular anatomy. Carotid stenosis measurements (when applicable) are obtained utilizing NASCET criteria, using the distal internal carotid diameter as the denominator. CONTRAST:   173mL OMNIPAQUE IOHEXOL 350 MG/ML SOLN COMPARISON:  None. FINDINGS: CTA NECK FINDINGS SKELETON: There is no bony spinal canal stenosis. No lytic or blastic lesion. OTHER NECK: Normal pharynx, larynx and major salivary glands. No cervical lymphadenopathy. Unremarkable thyroid gland. UPPER CHEST: No pneumothorax or pleural effusion. No nodules or masses. AORTIC ARCH: There is minimal calcific atherosclerosis of the aortic arch. There is no aneurysm, dissection or hemodynamically significant stenosis of the visualized portion of the aorta. Conventional 3 vessel aortic branching pattern. The visualized proximal subclavian arteries are widely patent. RIGHT CAROTID SYSTEM: Normal without aneurysm, dissection or stenosis. LEFT CAROTID SYSTEM: Normal without aneurysm, dissection or stenosis. VERTEBRAL ARTERIES: Left dominant configuration. Both origins are clearly patent. There is no dissection, occlusion or flow-limiting stenosis to the skull base (V1-V3 segments). CTA HEAD FINDINGS POSTERIOR CIRCULATION: --Vertebral arteries: Normal V4 segments. --Inferior cerebellar arteries: Normal. --Basilar artery: Normal. --Superior cerebellar arteries: Normal. --Posterior cerebral arteries (PCA): Normal. ANTERIOR  CIRCULATION: --Intracranial internal carotid arteries: Normal. --Anterior cerebral arteries (ACA): Normal. Both A1 segments are present. Patent anterior communicating artery (a-comm). --Middle cerebral arteries (MCA): Normal. VENOUS SINUSES: As permitted by contrast timing, patent. ANATOMIC VARIANTS: None Review of the MIP images confirms the above findings. IMPRESSION: 1. No emergent large vessel occlusion or high-grade stenosis of the intracranial or cervical arteries. Aortic Atherosclerosis (ICD10-I70.0). Electronically Signed   By: Ulyses Jarred M.D.   On: 12/29/2020 19:10   CT HEAD WO CONTRAST  Result Date: 12/29/2020 CLINICAL DATA:  Dysarthria/slurred speech EXAM: CT HEAD WITHOUT CONTRAST TECHNIQUE: Contiguous  axial images were obtained from the base of the skull through the vertex without intravenous contrast. COMPARISON:  December 25, 2007 FINDINGS: Brain: There is slight diffuse atrophy. Prominence of the cisterna magna is an anatomic variant. There is no intracranial mass, hemorrhage, extra-axial fluid collection, or midline shift. There is an age uncertain infarct involving the anterior limb of the right external capsule. There is decreased attenuation involving portions of the genu and immediately adjacent portions of the anterior and posterior limbs of the left internal capsule which appears acute. There is nearby decreased attenuation anterior superior left thalamus concerning for recent infarct in this area as well. Elsewhere, there is slight periventricular small vessel disease. Vascular: There is no appreciable hyperdense vessel. No appreciable vascular calcification. Skull: Bony calvarium appears intact. Sinuses/Orbits: Visualized paranasal sinuses are clear. Orbits appear symmetric bilaterally. Suspect mild drusen on each side. Other: Mastoid air cells are clear. IMPRESSION: 1. Suspect recent infarct involving a portion of the right internal capsule and immediately adjacent superior anterior right thalamus. Question older infarct involving the anterior limb of the right external capsule. 2. Slight periventricular small vessel disease. Slight diffuse atrophy. 3. No demonstrable mass or hemorrhage. Electronically Signed   By: Lowella Grip III M.D.   On: 12/29/2020 15:03   CT ANGIO NECK W OR WO CONTRAST  Result Date: 12/29/2020 CLINICAL DATA:  Slurred speech EXAM: CT ANGIOGRAPHY HEAD AND NECK TECHNIQUE: Multidetector CT imaging of the head and neck was performed using the standard protocol during bolus administration of intravenous contrast. Multiplanar CT image reconstructions and MIPs were obtained to evaluate the vascular anatomy. Carotid stenosis measurements (when applicable) are obtained utilizing  NASCET criteria, using the distal internal carotid diameter as the denominator. CONTRAST:  140mL OMNIPAQUE IOHEXOL 350 MG/ML SOLN COMPARISON:  None. FINDINGS: CTA NECK FINDINGS SKELETON: There is no bony spinal canal stenosis. No lytic or blastic lesion. OTHER NECK: Normal pharynx, larynx and major salivary glands. No cervical lymphadenopathy. Unremarkable thyroid gland. UPPER CHEST: No pneumothorax or pleural effusion. No nodules or masses. AORTIC ARCH: There is minimal calcific atherosclerosis of the aortic arch. There is no aneurysm, dissection or hemodynamically significant stenosis of the visualized portion of the aorta. Conventional 3 vessel aortic branching pattern. The visualized proximal subclavian arteries are widely patent. RIGHT CAROTID SYSTEM: Normal without aneurysm, dissection or stenosis. LEFT CAROTID SYSTEM: Normal without aneurysm, dissection or stenosis. VERTEBRAL ARTERIES: Left dominant configuration. Both origins are clearly patent. There is no dissection, occlusion or flow-limiting stenosis to the skull base (V1-V3 segments). CTA HEAD FINDINGS POSTERIOR CIRCULATION: --Vertebral arteries: Normal V4 segments. --Inferior cerebellar arteries: Normal. --Basilar artery: Normal. --Superior cerebellar arteries: Normal. --Posterior cerebral arteries (PCA): Normal. ANTERIOR CIRCULATION: --Intracranial internal carotid arteries: Normal. --Anterior cerebral arteries (ACA): Normal. Both A1 segments are present. Patent anterior communicating artery (a-comm). --Middle cerebral arteries (MCA): Normal. VENOUS SINUSES: As permitted by contrast timing, patent. ANATOMIC VARIANTS: None Review of  the MIP images confirms the above findings. IMPRESSION: 1. No emergent large vessel occlusion or high-grade stenosis of the intracranial or cervical arteries. Aortic Atherosclerosis (ICD10-I70.0). Electronically Signed   By: Ulyses Jarred M.D.   On: 12/29/2020 19:10   MR BRAIN WO CONTRAST  Result Date:  12/29/2020 CLINICAL DATA:  Slurred speech beginning 2-3 days ago. EXAM: MRI HEAD WITHOUT CONTRAST TECHNIQUE: Multiplanar, multiecho pulse sequences of the brain and surrounding structures were obtained without intravenous contrast. COMPARISON:  12/29/2020 head CT FINDINGS: Brain: Diffusion imaging shows an 8 mm acute infarction in the left anterolateral thalamus/posterior limb internal capsule. No other acute infarction. Elsewhere, extensive chronic small-vessel ischemic changes are seen throughout the pons. Old hemorrhagic infarction of the right superior cerebellum. Old hemorrhagic infarction of the right basal ganglia and radiating white matter tracts. Chronic small-vessel ischemic changes elsewhere affecting the thalami and basal ganglia. Chronic small-vessel ischemic changes elsewhere affecting the hemispheric white matter. No large vessel territory infarction. No mass, acute hemorrhage, hydrocephalus or extra-axial collection. Vascular: Major vessels at the base of the brain show flow. Skull and upper cervical spine: Negative Sinuses/Orbits: Clear/normal Other: None IMPRESSION: 1. 8 mm acute infarction in the left anterolateral thalamus/posterior limb internal capsule. 2. Extensive chronic small-vessel ischemic changes elsewhere throughout the brain as outlined above. 3. Old hemorrhagic infarctions of the right superior cerebellum, right basal ganglia and radiating white matter tracts. Electronically Signed   By: Nelson Chimes M.D.   On: 12/29/2020 16:29      Scheduled Meds: . aspirin EC  325 mg Oral Daily  . atorvastatin  80 mg Oral Daily  . folic acid  1 mg Oral Daily  . LORazepam  0.5 mg Intravenous Once  . multivitamin with minerals  1 tablet Oral Daily  . pantoprazole (PROTONIX) IV  40 mg Intravenous Q24H  . thiamine  100 mg Oral Daily   Or  . thiamine  100 mg Intravenous Daily   Continuous Infusions: . sodium chloride 50 mL/hr at 12/29/20 1757     LOS: 1 day      Debbe Odea,  MD [facility] Pager: [email or web address] 12/30/2020, 10:52 AM

## 2020-12-30 NOTE — Progress Notes (Signed)
STROKE TEAM PROGRESS NOTE   INTERVAL HISTORY Presented to ED yesterday afternoon with dysarthria, difficult speaking x 3 days  Patient does not attend to examiner. Unable to obtain ROS.  No visitors at bedside   Vitals:   12/29/20 1955 12/29/20 2335 12/29/20 2355 12/30/20 0355  BP: (!) 150/95 (!) 151/81 (!) 155/79 (!) 144/79  Pulse: 91 87 88 86  Resp: (!) 21 (!) 22 12 14   Temp:      TempSrc:      SpO2: 98% 96% 96% 97%   CBC:  Recent Labs  Lab 12/29/20 1247 12/29/20 2207 12/30/20 0400  WBC 8.8 9.7 9.7  NEUTROABS 6.4  --   --   HGB 14.4 14.4 13.9  HCT 43.2 42.8 40.3  MCV 101.4* 99.1 98.8  PLT 259 283 035   Basic Metabolic Panel:  Recent Labs  Lab 12/29/20 1247 12/29/20 2207 12/30/20 0400  NA 137  --  137  K 3.6  --  3.6  CL 104  --  106  CO2 24  --  22  GLUCOSE 105*  --  92  BUN 10  --  7*  CREATININE 0.96  --  0.94  CALCIUM 9.1  --  8.9  MG  --  2.5* 2.5*  PHOS  --  2.9  --    Lipid Panel:  Recent Labs  Lab 12/29/20 2207  CHOL 113  TRIG 71  HDL 41  CHOLHDL 2.8  VLDL 14  LDLCALC 58   HgbA1c:  Recent Labs  Lab 12/29/20 2207  HGBA1C 5.5   Urine Drug Screen:  Recent Labs  Lab 12/29/20 1332  LABOPIA NONE DETECTED  COCAINSCRNUR NONE DETECTED  LABBENZ NONE DETECTED  AMPHETMU NONE DETECTED  THCU NONE DETECTED  LABBARB NONE DETECTED    Alcohol Level  Recent Labs  Lab 12/29/20 1247  ETH <10    IMAGING past 24 hours CT ANGIO HEAD W OR WO CONTRAST  Result Date: 12/29/2020 CLINICAL DATA:  Slurred speech EXAM: CT ANGIOGRAPHY HEAD AND NECK TECHNIQUE: Multidetector CT imaging of the head and neck was performed using the standard protocol during bolus administration of intravenous contrast. Multiplanar CT image reconstructions and MIPs were obtained to evaluate the vascular anatomy. Carotid stenosis measurements (when applicable) are obtained utilizing NASCET criteria, using the distal internal carotid diameter as the denominator. CONTRAST:  128mL  OMNIPAQUE IOHEXOL 350 MG/ML SOLN COMPARISON:  None. FINDINGS: CTA NECK FINDINGS SKELETON: There is no bony spinal canal stenosis. No lytic or blastic lesion. OTHER NECK: Normal pharynx, larynx and major salivary glands. No cervical lymphadenopathy. Unremarkable thyroid gland. UPPER CHEST: No pneumothorax or pleural effusion. No nodules or masses. AORTIC ARCH: There is minimal calcific atherosclerosis of the aortic arch. There is no aneurysm, dissection or hemodynamically significant stenosis of the visualized portion of the aorta. Conventional 3 vessel aortic branching pattern. The visualized proximal subclavian arteries are widely patent. RIGHT CAROTID SYSTEM: Normal without aneurysm, dissection or stenosis. LEFT CAROTID SYSTEM: Normal without aneurysm, dissection or stenosis. VERTEBRAL ARTERIES: Left dominant configuration. Both origins are clearly patent. There is no dissection, occlusion or flow-limiting stenosis to the skull base (V1-V3 segments). CTA HEAD FINDINGS POSTERIOR CIRCULATION: --Vertebral arteries: Normal V4 segments. --Inferior cerebellar arteries: Normal. --Basilar artery: Normal. --Superior cerebellar arteries: Normal. --Posterior cerebral arteries (PCA): Normal. ANTERIOR CIRCULATION: --Intracranial internal carotid arteries: Normal. --Anterior cerebral arteries (ACA): Normal. Both A1 segments are present. Patent anterior communicating artery (a-comm). --Middle cerebral arteries (MCA): Normal. VENOUS SINUSES: As permitted by contrast timing,  patent. ANATOMIC VARIANTS: None Review of the MIP images confirms the above findings. IMPRESSION: 1. No emergent large vessel occlusion or high-grade stenosis of the intracranial or cervical arteries. Aortic Atherosclerosis (ICD10-I70.0). Electronically Signed   By: Ulyses Jarred M.D.   On: 12/29/2020 19:10   CT HEAD WO CONTRAST  Result Date: 12/29/2020 CLINICAL DATA:  Dysarthria/slurred speech EXAM: CT HEAD WITHOUT CONTRAST TECHNIQUE: Contiguous axial  images were obtained from the base of the skull through the vertex without intravenous contrast. COMPARISON:  December 25, 2007 FINDINGS: Brain: There is slight diffuse atrophy. Prominence of the cisterna magna is an anatomic variant. There is no intracranial mass, hemorrhage, extra-axial fluid collection, or midline shift. There is an age uncertain infarct involving the anterior limb of the right external capsule. There is decreased attenuation involving portions of the genu and immediately adjacent portions of the anterior and posterior limbs of the left internal capsule which appears acute. There is nearby decreased attenuation anterior superior left thalamus concerning for recent infarct in this area as well. Elsewhere, there is slight periventricular small vessel disease. Vascular: There is no appreciable hyperdense vessel. No appreciable vascular calcification. Skull: Bony calvarium appears intact. Sinuses/Orbits: Visualized paranasal sinuses are clear. Orbits appear symmetric bilaterally. Suspect mild drusen on each side. Other: Mastoid air cells are clear. IMPRESSION: 1. Suspect recent infarct involving a portion of the right internal capsule and immediately adjacent superior anterior right thalamus. Question older infarct involving the anterior limb of the right external capsule. 2. Slight periventricular small vessel disease. Slight diffuse atrophy. 3. No demonstrable mass or hemorrhage. Electronically Signed   By: Lowella Grip III M.D.   On: 12/29/2020 15:03   CT ANGIO NECK W OR WO CONTRAST  Result Date: 12/29/2020 CLINICAL DATA:  Slurred speech EXAM: CT ANGIOGRAPHY HEAD AND NECK TECHNIQUE: Multidetector CT imaging of the head and neck was performed using the standard protocol during bolus administration of intravenous contrast. Multiplanar CT image reconstructions and MIPs were obtained to evaluate the vascular anatomy. Carotid stenosis measurements (when applicable) are obtained utilizing  NASCET criteria, using the distal internal carotid diameter as the denominator. CONTRAST:  15mL OMNIPAQUE IOHEXOL 350 MG/ML SOLN COMPARISON:  None. FINDINGS: CTA NECK FINDINGS SKELETON: There is no bony spinal canal stenosis. No lytic or blastic lesion. OTHER NECK: Normal pharynx, larynx and major salivary glands. No cervical lymphadenopathy. Unremarkable thyroid gland. UPPER CHEST: No pneumothorax or pleural effusion. No nodules or masses. AORTIC ARCH: There is minimal calcific atherosclerosis of the aortic arch. There is no aneurysm, dissection or hemodynamically significant stenosis of the visualized portion of the aorta. Conventional 3 vessel aortic branching pattern. The visualized proximal subclavian arteries are widely patent. RIGHT CAROTID SYSTEM: Normal without aneurysm, dissection or stenosis. LEFT CAROTID SYSTEM: Normal without aneurysm, dissection or stenosis. VERTEBRAL ARTERIES: Left dominant configuration. Both origins are clearly patent. There is no dissection, occlusion or flow-limiting stenosis to the skull base (V1-V3 segments). CTA HEAD FINDINGS POSTERIOR CIRCULATION: --Vertebral arteries: Normal V4 segments. --Inferior cerebellar arteries: Normal. --Basilar artery: Normal. --Superior cerebellar arteries: Normal. --Posterior cerebral arteries (PCA): Normal. ANTERIOR CIRCULATION: --Intracranial internal carotid arteries: Normal. --Anterior cerebral arteries (ACA): Normal. Both A1 segments are present. Patent anterior communicating artery (a-comm). --Middle cerebral arteries (MCA): Normal. VENOUS SINUSES: As permitted by contrast timing, patent. ANATOMIC VARIANTS: None Review of the MIP images confirms the above findings. IMPRESSION: 1. No emergent large vessel occlusion or high-grade stenosis of the intracranial or cervical arteries. Aortic Atherosclerosis (ICD10-I70.0). Electronically Signed   By: Lennette Bihari  Collins [place] M.D.   On: 12/29/2020 19:10   MR BRAIN WO CONTRAST  Result Date:  12/29/2020 CLINICAL DATA:  Slurred speech beginning 2-3 days ago. EXAM: MRI HEAD WITHOUT CONTRAST TECHNIQUE: Multiplanar, multiecho pulse sequences of the brain and surrounding structures were obtained without intravenous contrast. COMPARISON:  12/29/2020 head CT FINDINGS: Brain: Diffusion imaging shows an 8 mm acute infarction in the left anterolateral thalamus/posterior limb internal capsule. No other acute infarction. Elsewhere, extensive chronic small-vessel ischemic changes are seen throughout the pons. Old hemorrhagic infarction of the right superior cerebellum. Old hemorrhagic infarction of the right basal ganglia and radiating white matter tracts. Chronic small-vessel ischemic changes elsewhere affecting the thalami and basal ganglia. Chronic small-vessel ischemic changes elsewhere affecting the hemispheric white matter. No large vessel territory infarction. No mass, acute hemorrhage, hydrocephalus or extra-axial collection. Vascular: Major vessels at the base of the brain show flow. Skull and upper cervical spine: Negative Sinuses/Orbits: Clear/normal Other: None IMPRESSION: 1. 8 mm acute infarction in the left anterolateral thalamus/posterior limb internal capsule. 2. Extensive chronic small-vessel ischemic changes elsewhere throughout the brain as outlined above. 3. Old hemorrhagic infarctions of the right superior cerebellum, right basal ganglia and radiating white matter tracts. Electronically Signed   By: Nelson Chimes M.D.   On: 12/29/2020 16:29   PHYSICAL EXAM Constitutional: Appears frail and malnourished looking middle-aged African-American lady Psych: Affect appropriate to situation Eyes: No scleral injection HENT: No OP obstruction MSK: no joint deformities.  Cardiovascular: Normal rate and regular rhythm.  Respiratory: Effort normal, non-labored breathing GI: Soft.  No distension. There is no tenderness.  Skin: WDI  Neuro: Mental Status: Patient is awake, alert, oriented to  person, place, year, and situation.  When asked month, she states "November," then immediately looks frustrated and says it is not November it is February.  She has an expressive aphasia, able to get her point across but with latency of speech. Moderate dysarthria Cranial Nerves: II: Visual Fields are full. Pupils are equal, round, and reactive to light.   III,IV, VI: EOMI without ptosis or diploplia.  V: Facial sensation is symmetric to temperature VII: Facial movement with mild right facial weakness VIII: hearing is intact to voice X: Uvula elevates symmetrically XI: Shoulder shrug is symmetric. XII: tongue is midline without atrophy or fasciculations.  Motor: Tone is normal. Bulk is normal.  4/5 strength in the right arm and leg. Diminished fine finger movements on the right. Orbits left to right upper extremity. Sensory: Sensation is symmetric to light touch and temperature in the arms and legs. Cerebellar: Consistent with weakness on right  ASSESSMENT/PLAN  Kara Bailey is a 65 y.o. female with a history of smoking, PAD, HTN, ETOH abuse, chronic hepatitis C with hepatic coma,GAD, esophageal stricture,  recent episode of possible hematemesis, who presented to [facility] ED with right-sided weakness and difficulty speaking that has been going on since waking up on 2/17.  She went to bed in her normal state of health on 2/16.  The next morning it was noticed that she was having some trouble with her speech and then it became worse and therefore she sought care in the emergency department.  In the [place] ED she had an MRI which demonstrated small thalamic infarct. She was transferred to [facility] for further evaluation and care.  Left thalamic ischemic stroke  likely secondary to small vessel disease   Code Stroke  recent infarct involving a portion of the right internal capsule and immediately adjacent superior anterior  right thalamus.Question older infarct involving the  anterior limb of the right external capsule.Slight periventricular small vessel disease. Slight diffuse atrophy.  CTA head & neck: No LVO  MRI  8 mm acute infarction in the left anterolateral  thalamus/posterior limb internal capsule.  2D Echo EF 75%. No shunt. Grade I diastolic dysfunction  LDL 58  HgbA1c 5.5  VTE prophylaxis is recommended  Passed barium swallow speech eval with mechanical soft and nectar thick liquid diet recommended  No AC/Antiplatelet prior to admission, now on ASA 325mg  pending concern regarding hematemesis and swallow status. Aspirin 81 and Plavix 75 mg daily for 3 weeks followed by aspirin alone if she is able to swallow safely  Therapy recommendations:  TBD  Disposition: TBD  Hypertension . Permissive hypertension (OK if < 220/120) but gradually normalize in 5-7 days . Long-term BP goal normotensive  Hyperlipidemia  Home meds:  Lipitor 10mg    LDL 58, goal < 70  High intensity statin initiated: Lipitor 80mg    Continue statin at discharge  Other Stroke Risk Factors  Advanced Age >/= 27   Cigarette smoker: 20 pack year smoking history. On Nicoderm here.   Obesity, There is no height or weight on file to calculate BMI., BMI >/= 30 associated with increased stroke risk, recommend weight loss, diet and exercise as appropriate   Other Active Problems managed by primary team Left upper abdominal pain: monitoring ETOH use disorder: on CIWA GAD: on home meds, required one dose ativan today Esophageal stricture: continue PPI PAD: resume Lipitor and a platelet inhibitor once able to swallow Hospital day # 1  I have personally obtained history,examined this patient, reviewed notes, independently viewed imaging studies, participated in medical decision making and plan of care.ROS completed by me personally and pertinent positives fully documented  I have made any additions or clarifications directly to the above note. Agree with note above. She  presented with severe dysarthria and speech difficulties due to left thalamic infarct from small vessel disease. Recommend aspirin Plavix for 3 weeks followed by aspirin alone and aggressive risk factor modification. Patient counseled to quit smoking cigarettes and seems agreeable. Continue ongoing stroke work-up and therapy and speech therapy consults. Greater than 50% time during the 35-minute visit was spent on counseling and coordination of care about her thalamic stroke and answering questions and discussion with care team. Discussed with Dr. Wynelle Cleveland. Stroke team will sign off. Kindly call for questions. Follow-up as an outpatient stroke clinic in 6 weeks  Antony Contras, MD Medical Director [place] Pager: [phone] 12/30/2020 5:27 PM   To contact Stroke Continuity provider, please refer to [email or web address] After hours, contact General Neurology

## 2020-12-30 NOTE — Progress Notes (Addendum)
Patient complaining of chest pain under left breast, not radiating. Paged Dr. Wynelle Cleveland x2. EKG done, placed in patient's chart, and patient given PRN labetalol to treat high BP.

## 2020-12-30 NOTE — Progress Notes (Signed)
  Echocardiogram 2D Echocardiogram has been performed.  Kara Bailey 12/30/2020, 3:22 PM

## 2020-12-30 NOTE — Evaluation (Signed)
Speech Language Pathology Evaluation Patient Details Name: Kara Bailey MRN: [id number] DOB: Nov 18, 1955 Today's Date: 12/30/2020 Time: 2409-7353 SLP Time Calculation (min) (ACUTE ONLY): 27 min  Problem List:  Patient Active Problem List   Diagnosis Date Noted  . Acute CVA (cerebrovascular accident) ([place]) 12/29/2020  . Peripheral arterial disease ([place]) 12/20/2020  . History of esophageal stricture 07/08/2020  . Insomnia 12/02/2017  . Chronic cough 08/14/2017  . Poor appetite 08/29/2016  . Peptic stricture of esophagus   . History of colonic polyps   . Hiatal hernia 03/16/2016  . Odynophagia 03/16/2016  . Dysphagia 03/16/2016  . Alcohol abuse 10/26/2015  . Essential hypertension 09/26/2015  . PAC (premature atrial contraction) 10/11/2014  . PVC (premature ventricular contraction) 10/11/2014  . Back muscle spasm 06/15/2014  . Generalized anxiety disorder 06/15/2014  . Smoking 06/15/2014  . Chronic hepatitis C without hepatic coma ([place]) 05/28/2013   Past Medical History:  Past Medical History:  Diagnosis Date  . Anxiety   . Chest pain 08/2014   unspecified  . Cirrhosis ([place])   . Depression   . Dizziness 08/2014  . Dyspnea 08/2014  . Emphysema lung ([place])   . GERD (gastroesophageal reflux disease)   . Hepatitis C   . Hepatitis C virus infection   . Hypertension   . PAC (premature atrial contraction) 10/11/2014  . PAD (peripheral artery disease) ([place])   . Palpitations   . PVC (premature ventricular contraction) 10/11/2014  . Shingles    left side  . 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 Tachycardia 08/2014  . Ventral hernia   . Weakness 08/2014   Past Surgical History:  Past Surgical History:  Procedure Laterality Date  . APPENDECTOMY    . CATARACT EXTRACTION, BILATERAL    . COLONOSCOPY    . COLONOSCOPY WITH PROPOFOL N/A 04/30/2016   Procedure: COLONOSCOPY WITH PROPOFOL;  Surgeon: Daneil Dolin, MD;  Location: [facility];  Service:  Endoscopy;  Laterality: N/A;  1230  . ESOPHAGOGASTRODUODENOSCOPY     approximately 2010  . ESOPHAGOGASTRODUODENOSCOPY (EGD) WITH PROPOFOL N/A 04/30/2016   Procedure: ESOPHAGOGASTRODUODENOSCOPY (EGD) WITH PROPOFOL;  Surgeon: Daneil Dolin, MD;  Location: [facility];  Service: Endoscopy;  Laterality: N/A;  . LEFT HEART CATHETERIZATION WITH CORONARY ANGIOGRAM N/A 10/29/2014   07-14-20- pt denies this Procedure: LEFT HEART CATHETERIZATION WITH CORONARY ANGIOGRAM;  Surgeon: Burnell Blanks, MD;  Location: [facility] CATH LAB;  Service: Cardiovascular;  Laterality: N/A;  . Venia Minks DILATION N/A 04/30/2016   Procedure: Venia Minks DILATION;  Surgeon: Daneil Dolin, MD;  Location: [facility];  Service: Endoscopy;  Laterality: N/A;  . POLYPECTOMY  04/30/2016   Procedure: POLYPECTOMY;  Surgeon: Daneil Dolin, MD;  Location: [facility];  Service: Endoscopy;;  Sigmoid colon polyp removed via hot snare  . UPPER GASTROINTESTINAL ENDOSCOPY     HPI:  Pt is a 65 y.o. female with medical history significant for peripheral vascular disease followed by cardiology, essential hypertension, hyperlipidemia, chronic anxiety/depression, GERD, LA grade A esophagitis, esophageal stenosis postop dilatation, tobacco use disorder, alcohol use disorder, who presented to [facility] ED due to speech difficulty of 3 days duration and difficulty with swallowing. MRI brain 2/17: . 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 Pt failed the [facility] swallow screen due to coughing.   Assessment / Plan / Recommendation Clinical Impression  Pt participated in speech/language/cognition evaluation. Pt denied any baseline deficits in speech or language, but stated that she has  some difficulty with memory. She reported difficulty pronouncing words, but denied any acute changes in language or cognition. She demonstrated moderate to severe dysarthria characterized by imprecise articulation, reduced speaking  rate with intermittent bursts of rapid speech, reduced vocal intensity and intermittent phonation breaks due to impaired coordination of respiration with speech. Pt intermittently exhibited some possible difficulty with motor planning; further assessment of this is warranted. Her receptive language skills were WNL and expressive language was WNL during structured tasks. Pt's rate of speech was reduced and pauses were noted during conversation. This appeared to be more so due to the need for additional time for speech production than for word retrieval, but difficulty with sentence formulation of more complex sentences was occasionally observed. Skilled SLP services are clinically indicated at this time to improve motor speech function and for diagnostic treatment.    SLP Assessment  SLP Recommendation/Assessment: Patient needs continued Speech Lanaguage Pathology Services SLP Visit Diagnosis: Dysarthria and anarthria (R47.1)    Follow Up Recommendations  Home health SLP    Frequency and Duration min 2x/week  2 weeks      SLP Evaluation Cognition  Overall Cognitive Status: Within Functional Limits for tasks assessed Arousal/Alertness: Awake/alert Orientation Level: Oriented X4 Attention: Focused;Sustained Focused Attention: Appears intact Sustained Attention: Appears intact Awareness: Appears intact Problem Solving: Appears intact       Comprehension  Auditory Comprehension Overall Auditory Comprehension: Appears within functional limits for tasks assessed Yes/No Questions: Within Functional Limits Commands: Within Functional Limits Conversation: Simple    Expression Expression Primary Mode of Expression: Verbal Verbal Expression Overall Verbal Expression: Appears within functional limits for tasks assessed Initiation: No impairment Automatic Speech: Counting;Day of week;Month of year (WNL) Level of Generative/Spontaneous Verbalization: Conversation Repetition: Impaired Level  of Impairment: Phrase level Naming: No impairment Pragmatics: No impairment   Oral / Motor  Oral Motor/Sensory Function Overall Oral Motor/Sensory Function: Mild impairment Facial ROM: Reduced right;Suspected CN VII (facial) dysfunction Facial Symmetry: Abnormal symmetry right;Suspected CN VII (facial) dysfunction Facial Strength: Within Functional Limits Facial Sensation: Within Functional Limits Lingual ROM: Reduced right;Suspected CN XII (hypoglossal) dysfunction Lingual Symmetry: Abnormal symmetry right;Suspected CN XII (hypoglossal) dysfunction Lingual Strength: Reduced;Suspected CN XII (hypoglossal) dysfunction Lingual Sensation: Within Functional Limits Motor Speech Overall Motor Speech: Impaired Respiration: Impaired Level of Impairment: Conversation Phonation: Normal Resonance: Within functional limits Articulation: Impaired Level of Impairment: Conversation Intelligibility: Intelligibility reduced Word: 75-100% accurate Phrase: 75-100% accurate Sentence: 75-100% accurate   Vania Rosero I. Hardin Negus, [place], [place] Office number [phone] Pager [place] 12/30/2020, 2:14 PM

## 2020-12-30 NOTE — Evaluation (Signed)
Occupational Therapy Evaluation Patient Details Name: Kara Bailey MRN: [id number] DOB: 1956-05-23 Today's Date: 12/30/2020    History of Present Illness 65 y.o. female with a history of smoking, peripheral artery disease, hypertension who presents with right-sided weakness and difficulty speaking that has been going on since waking up on Thursday. MRI demonstrated small thalamic infarct. Pt also reports recent hematemesis.   Clinical Impression   PTA, pt was living with her boyfriend and was independent with ADLs, IADLs, and driving; pt was assisting boyfriend as needed. Upon arrival, pt tearful and upset about swallow study results; however, easily redirected. Pt currently requiring Min Guard-Min A for ADLs and functional mobility. Pt presenting with decreased balance, vision, cognition, and functional use of RUE. Pt performing significantly worse compared to PT evaluation this AM. During functional mobility in hallway, pt presenting with R inattention, walking into objects on L and turning RW towards left to run into walls and doorframes. Despite cues, pt with poor problem solving and awareness of errors. When attempting to move around objects on left, pt with LOB and requiring Min A for fall prevention. Pt would benefit from further acute OT to facilitate safe dc. Due to pt's PLOF, high motivation, and deficits, recommend dc to inpatient rehab with goal to transition to home with boyfriend safely.      Follow Up Recommendations  CIR    Equipment Recommendations  3 in 1 bedside commode    Recommendations for Other Services PT consult;Rehab consult;Speech consult     Precautions / Restrictions Precautions Precautions: Fall Precaution Comments: Poor attention to R and safety awareness      Mobility Bed Mobility Overal bed mobility: Needs Assistance Bed Mobility: Supine to Sit     Supine to sit: Min guard;HOB elevated     General bed mobility comments: Min Guard A for safety     Transfers Overall transfer level: Needs assistance Equipment used: Rolling walker (2 wheeled) Transfers: Sit to/from Stand Sit to Stand: Min guard         General transfer comment: Min Guard A for safety    Balance Overall balance assessment: Needs assistance Sitting-balance support: No upper extremity supported;Feet supported Sitting balance-Leahy Scale: Good     Standing balance support: Bilateral upper extremity supported;During functional activity Standing balance-Leahy Scale: Fair                             ADL either performed or assessed with clinical judgement   ADL Overall ADL's : Needs assistance/impaired Eating/Feeding: NPO   Grooming: Supervision/safety;Set up;Sitting   Upper Body Bathing: Supervision/ safety;Set up;Sitting   Lower Body Bathing: Minimal assistance;Sit to/from stand   Upper Body Dressing : Supervision/safety;Set up;Sitting   Lower Body Dressing: Minimal assistance;Sit to/from stand   Toilet Transfer: Min guard;Ambulation;RW (simulated to recliner)           Functional mobility during ADLs: Min guard;Minimal assistance;Rolling walker General ADL Comments: Pt presenting with decreased strength adn coorindation at RUE, poor balance, and decreased cognition. During mobility in hallway, pt running into objects on L and when attempting to correct, pt loosing balance     Vision Baseline Vision/History: No visual deficits Vision Assessment?: Yes Additional Comments: Need to further assess vision: visual attention, field cut, etc. Pt tracking to all four quad but poor visual attention to R. Seeming to have poor attention to R during mobility.     Perception     Praxis  Pertinent Vitals/Pain Pain Assessment: No/denies pain     Hand Dominance Right   Extremity/Trunk Assessment Upper Extremity Assessment Upper Extremity Assessment: RUE deficits/detail RUE Deficits / Details: Decreased grasp strength and gross  strength. Slow finger opposition. RUE Coordination: decreased fine motor;decreased gross motor   Lower Extremity Assessment Lower Extremity Assessment: Defer to PT evaluation   Cervical / Trunk Assessment Cervical / Trunk Assessment: Normal   Communication Communication Communication: Expressive difficulties (dysarthria)   Cognition Arousal/Alertness: Awake/alert Behavior During Therapy: WFL for tasks assessed/performed (tearful upon arrival) Overall Cognitive Status: Impaired/Different from baseline Area of Impairment: Following commands;Safety/judgement;Awareness;Problem solving;Attention                   Current Attention Level: Selective   Following Commands: Follows multi-step commands inconsistently Safety/Judgement: Decreased awareness of safety Awareness: Emergent Problem Solving: Requires verbal cues General Comments: Pt presenting with poor safety awareness. During functional mobiltiy, pt presenting with R inattention and navigating RW to L running into objects on L as well as the wall and doorframes. When bring this to pt's attention, pt stating "this walker's wheels dont work." Requiring Max cues to correct navigation.   General Comments  VSS on RA. Boyfriend arriving at end of session    Exercises     Shoulder Instructions      Home Living Family/patient expects to be discharged to:: Private residence Living Arrangements: Spouse/significant other (boyfriend) Available Help at Discharge: Family;Available 24 hours/day (Superision only as pt typically assists boyfriend) Type of Home: Apartment Home Access: Level entry     Home Layout: One level               Home Equipment: None      Lives With: Significant other    Prior Functioning/Environment Level of Independence: Independent        Comments: ADLs, IADLs, and driving. Enjoys playing cards and dominos        OT Problem List: Decreased strength;Decreased range of motion;Impaired  balance (sitting and/or standing);Decreased activity tolerance;Decreased safety awareness;Decreased knowledge of precautions;Decreased knowledge of use of DME or AE;Decreased cognition;Decreased coordination;Impaired vision/perception;Impaired UE functional use      OT Treatment/Interventions: Self-care/ADL training;Therapeutic exercise;Energy conservation;DME and/or AE instruction;Therapeutic activities;Patient/family education    OT Goals(Current goals can be found in the care plan section) Acute Rehab OT Goals Patient Stated Goal: to go home to help care for significant other OT Goal Formulation: With patient Time For Goal Achievement: 01/13/21 Potential to Achieve Goals: Good  OT Frequency: Min 2X/week   Barriers to D/C:            Co-evaluation              AM-PAC OT "6 Clicks" Daily Activity     Outcome Measure Help from another person eating meals?: Total Help from another person taking care of personal grooming?: A Little Help from another person toileting, which includes using toliet, bedpan, or urinal?: A Little Help from another person bathing (including washing, rinsing, drying)?: A Little Help from another person to put on and taking off regular upper body clothing?: A Little Help from another person to put on and taking off regular lower body clothing?: A Little 6 Click Score: 16   End of Session Equipment Utilized During Treatment: Gait belt;Rolling walker Nurse Communication: Mobility status  Activity Tolerance: Patient tolerated treatment well Patient left: in bed;with call bell/phone within reach;with bed alarm set;with family/visitor present  OT Visit Diagnosis: Unsteadiness on feet (R26.81);Other abnormalities of gait and mobility (  R26.89);Muscle weakness (generalized) (M62.81);Hemiplegia and hemiparesis Hemiplegia - Right/Left: Right Hemiplegia - dominant/non-dominant: Dominant Hemiplegia - caused by: Cerebral infarction                Time:  1350-1418 OT Time Calculation (min): 28 min Charges:  OT General Charges $OT Visit: 1 Visit OT Evaluation $OT Eval Moderate Complexity: 1 Mod OT Treatments $Self Care/Home Management : 8-22 mins  Kierstin January MSOT, OTR/L Acute Rehab Pager: [phone] Office: [place] 12/30/2020, 3:26 PM

## 2020-12-31 DIAGNOSIS — F411 Generalized anxiety disorder: Secondary | ICD-10-CM | POA: Diagnosis not present

## 2020-12-31 DIAGNOSIS — F1010 Alcohol abuse, uncomplicated: Secondary | ICD-10-CM | POA: Diagnosis not present

## 2020-12-31 DIAGNOSIS — I10 Essential (primary) hypertension: Secondary | ICD-10-CM | POA: Diagnosis not present

## 2020-12-31 DIAGNOSIS — B182 Chronic viral hepatitis C: Secondary | ICD-10-CM | POA: Diagnosis not present

## 2020-12-31 MED ORDER — ASPIRIN 81 MG PO TBEC: 81.0000 mg | DELAYED_RELEASE_TABLET | Freq: Every day | ORAL | 11 refills | Status: DC

## 2020-12-31 MED ORDER — MIRTAZAPINE 15 MG PO TABS
30.0000 mg | ORAL_TABLET | Freq: Every day | ORAL | Status: DC
Start: 2020-12-31 — End: 2020-12-31

## 2020-12-31 MED ORDER — BUSPIRONE HCL 10 MG PO TABS: 15.0000 mg | ORAL_TABLET | Freq: Two times a day (BID) | ORAL | Status: DC

## 2020-12-31 MED ORDER — RESOURCE THICKENUP CLEAR PO POWD: 1.0000 | ORAL | Status: DC | PRN

## 2020-12-31 MED ORDER — ESCITALOPRAM OXALATE 10 MG PO TABS
20.0000 mg | ORAL_TABLET | Freq: Every day | ORAL | Status: DC
  Administered 2020-12-31: 20 mg via ORAL
  Filled 2020-12-31: qty 2

## 2020-12-31 MED ORDER — CLOPIDOGREL BISULFATE 75 MG PO TABS: 75.0000 mg | ORAL_TABLET | Freq: Every day | ORAL | 0 refills | Status: DC

## 2020-12-31 MED ORDER — ADULT MULTIVITAMIN W/MINERALS CH: 1.0000 | ORAL_TABLET | Freq: Every day | ORAL | 1 refills | Status: DC

## 2020-12-31 MED ORDER — FAMOTIDINE 20 MG PO TABS
40.0000 mg | ORAL_TABLET | Freq: Every day | ORAL | Status: DC
  Administered 2020-12-31: 40 mg via ORAL
  Filled 2020-12-31: qty 2

## 2020-12-31 MED ORDER — FOOD THICKENER (THICKENUP CLEAR): ORAL | 1 refills | Status: DC

## 2020-12-31 MED ORDER — RESOURCE THICKENUP CLEAR PO POWD
ORAL | Status: DC | PRN
  Filled 2020-12-31: qty 125

## 2020-12-31 MED ORDER — PANTOPRAZOLE SODIUM 40 MG PO TBEC: 40.0000 mg | DELAYED_RELEASE_TABLET | Freq: Every day | ORAL | Status: DC

## 2020-12-31 MED ORDER — ATORVASTATIN CALCIUM 80 MG PO TABS: 80.0000 mg | ORAL_TABLET | Freq: Every day | ORAL | 0 refills | Status: DC

## 2020-12-31 MED ORDER — THIAMINE HCL 100 MG PO TABS: 100.0000 mg | ORAL_TABLET | Freq: Every day | ORAL | 0 refills | Status: DC

## 2020-12-31 MED ORDER — NICOTINE 21 MG/24HR TD PT24: 21.0000 mg | MEDICATED_PATCH | Freq: Every day | TRANSDERMAL | 0 refills | Status: DC

## 2020-12-31 NOTE — TOC Transition Note (Addendum)
Transition of Care [facility]) - CM/SW Discharge Note   Patient Details  Name: HARRY BARK MRN: [id number] Date of Birth: 06-Sep-1956  [facility] CM/SW Contact:  Carles Collet, RN Phone Number: 12/31/2020, 11:45 AM   Clinical Narrative:   Damaris Schooner w patient over the phone. She states that she has tub slider bench, declined 3/1. She requested rollator as recommended by PT. Order placed and [employer] will deliver to room prior to DC. Discussed [place] services. She would like [place]. Referral accepted. No other CM needs identified.  Rollator out of stock in the hospital. Spoke w patient, she states she has something else she can use at home, and wished to have it shipped to her house, understanding that it will take a few days to get there.    Final next level of care: [place] Barriers to Discharge: Barriers Resolved   Patient Goals and CMS Choice Patient states their goals for this hospitalization and ongoing recovery are:: to go home CMS Medicare.gov Compare Post Acute Care list provided to:: Patient Choice offered to / list presented to : Patient  Discharge Placement                       Discharge Plan and Services                DME Arranged: Walker rolling with seat DME Agency: [employer] Date DME Agency Contacted: 12/31/20 Time DME Agency Contacted: [id number] Representative spoke with at DME Agency: [place]: [place]: [place] Date [place]: 12/31/20 Time [place]: [id number] Representative spoke with at [place]: [place] Determinants of Health ([place]) Interventions     Readmission Risk Interventions No flowsheet data found.

## 2020-12-31 NOTE — Discharge Instructions (Signed)
Please follow speech therapy instructions for nectar thick liquids and a soft/ chopped diet.  In order to prevent another stroke, you need to stop smoking cigarettes.  Try to avoid drinking alcohol on a daily basis.   You were cared for by a hospitalist during your hospital stay. If you have any questions about your discharge medications or the care you received while you were in the hospital after you are discharged, you can call the unit and asked to speak with the hospitalist on call if the hospitalist that took care of you is not available. Once you are discharged, your primary care physician will handle any further medical issues.   Please note that NO REFILLS for any discharge medications will be authorized once you are discharged, as it is imperative that you return to your primary care physician (or establish a relationship with a primary care physician if you do not have one) for your aftercare needs so that they can reassess your need for medications and monitor your lab values.  Please take all your medications with you for your next visit with your Primary MD. Please ask your Primary MD to get all Hospital records sent to his/her office. Please request your Primary MD to go over all hospital test results at the follow up.   If you experience worsening of your admission symptoms, develop shortness of breath, chest pain, suicidal or homicidal thoughts or a life threatening emergency, you must seek medical attention immediately by calling 911 or calling your MD.   Dennis Bast must read the complete instructions/literature along with all the possible adverse reactions/side effects for all the medicines you take including new medications that have been prescribed to you. Take new medicines after you have completely understood and accpet all the possible adverse reactions/side effects.    Do not drive when taking pain medications or sedatives.     Do not take more than prescribed Pain, Sleep and  Anxiety Medications   If you have smoked or chewed Tobacco in the last 2 yrs please stop. Stop any regular alcohol  and or recreational drug use.   Wear Seat belts while driving.

## 2020-12-31 NOTE — Progress Notes (Signed)
Occupational Therapy Treatment Patient Details Name: Kara Bailey MRN: [id number] DOB: [birth year] Today's Date: 12/31/2020    History of present illness 65 y.o. female with a history of smoking, peripheral artery disease, hypertension who presents with right-sided weakness and difficulty speaking that has been going on since waking up on Thursday. MRI demonstrated small thalamic infarct. Pt also reports recent hematemesis.   OT comments  Pt pleasant and motivated for participation, appears with overall functional improvement from OT session yesterday. No significant visual deficits observed, scanning intact, ?inattention to R side no field cut. Clock draw showing mild spatial impairment, but numbers and placement appropriate. Currently requiring min guard for functional transfers and in room mobility, continues need for cues for awareness of environment. Tolerated seated grooming and dressing with set up and increased time UB and CGA for dynamic/LB tasks. OT with strong recommendation for 24hr S/A at time of DC with follow up [place] if plan is for d/c to home which per pt, is her preference. Session at times limited d/t dysarthria of speech and decreased insight. Pt would benefit from use of DME including rw, tub transfer bench to maximize safety. OT will continue to follow acutely.   Follow Up Recommendations  CIR    Equipment Recommendations  3 in 1 bedside commode    Recommendations for Other Services PT consult;Rehab consult;Speech consult    Precautions / Restrictions Precautions Precautions: Fall Precaution Comments: Poor attention to R and safety awareness Restrictions Weight Bearing Restrictions: No       Mobility Bed Mobility Overal bed mobility: Needs Assistance Bed Mobility: Supine to Sit;Sit to Supine     Supine to sit: Min guard;HOB elevated Sit to supine: Supervision      Transfers Overall transfer level: Needs assistance Equipment used: Rolling walker (2  wheeled) Transfers: Sit to/from Stand Sit to Stand: Min guard              Balance   Sitting-balance support: No upper extremity supported;Feet supported                                       ADL either performed or assessed with clinical judgement   ADL       Grooming: Set up;Supervision/safety;Sitting   Upper Body Bathing: Supervision/ safety;Set up;Sitting   Lower Body Bathing: Minimal assistance;Sit to/from stand           Toilet Transfer: Min guard;RW           Functional mobility during ADLs: Passenger transport manager     Praxis      Cognition Arousal/Alertness: Awake/alert Behavior During Therapy: WFL for tasks assessed/performed Overall Cognitive Status: Impaired/Different from baseline Area of Impairment: Following commands;Safety/judgement;Awareness;Problem solving;Attention                   Current Attention Level: Selective   Following Commands: Follows multi-step commands inconsistently Safety/Judgement: Decreased awareness of safety;Decreased awareness of deficits Awareness: Emergent Problem Solving: Requires verbal cues General Comments: continues with decreased safety awareness and insight to deficits, cues for technique to maximize indep and safety with transfers and mobility. continues to decreased awareness to L requiring cues for negotiation        Exercises     Shoulder Instructions       General Comments VSS throughout    Pertinent Vitals/  Pain       Pain Assessment: No/denies pain  Home Living                                          Prior Functioning/Environment              Frequency  Min 2X/week        Progress Toward Goals  OT Goals(current goals can now be found in the care plan section)  Progress towards OT goals: Progressing toward goals  Acute Rehab OT Goals Patient Stated Goal: to go home OT Goal Formulation: With  patient Time For Goal Achievement: 01/13/21 Potential to Achieve Goals: Good  Plan Discharge plan remains appropriate    Co-evaluation                 AM-PAC OT "6 Clicks" Daily Activity     Outcome Measure   Help from another person eating meals?: A Little Help from another person taking care of personal grooming?: A Little Help from another person toileting, which includes using toliet, bedpan, or urinal?: A Little Help from another person bathing (including washing, rinsing, drying)?: A Little Help from another person to put on and taking off regular upper body clothing?: A Little Help from another person to put on and taking off regular lower body clothing?: A Little 6 Click Score: 18    End of Session Equipment Utilized During Treatment: Gait belt;Rolling walker  OT Visit Diagnosis: Unsteadiness on feet (R26.81);Other abnormalities of gait and mobility (R26.89);Muscle weakness (generalized) (M62.81);Hemiplegia and hemiparesis Hemiplegia - Right/Left: Right Hemiplegia - dominant/non-dominant: Dominant Hemiplegia - caused by: Cerebral infarction   Activity Tolerance Patient tolerated treatment well   Patient Left in bed;with bed alarm set;with call bell/phone within reach   Nurse Communication          Time: 1916-6060 OT Time Calculation (min): 43 min  Charges: OT General Charges $OT Visit: 1 Visit OT Treatments $Self Care/Home Management : 38-52 mins  Coye Dawood OTR/L acute rehab services Office: [phone]  Joya Gaskins 12/31/2020, 12:43 PM

## 2020-12-31 NOTE — Discharge Summary (Addendum)
Physician Discharge Summary  Kara Bailey VCB:[id number] DOB: 03-Aug-1956 DOA: 12/29/2020  PCP: Andree Moro, DO  Admit date: 12/29/2020 Discharge date: 12/31/2020  Admitted From: home  Disposition:  home   Recommendations for Outpatient Follow-up:  1. Encourage to stop smoking and alcohol  Home Health:  ordered  Discharge Condition:  stable   CODE STATUS:  Full code   Diet recommendation:  Heart healthy, D 3 with nectar thick liquids Consultations:  Neurology  Procedures/Studies: . See below   Discharge Diagnoses:  Principal Problem:   Acute CVA (cerebrovascular accident) ([place]) Active Problems:   Chronic hepatitis C without hepatic coma ([place])   Generalized anxiety disorder   Smoking   Essential hypertension   Alcohol abuse   History of esophageal stricture   Peripheral arterial disease ([place])     Brief Summary: Kara Bailey is a 65 year old female with peripheral vascular disease, essential hypertension, hyperlipidemia, gastroesophageal reflux disease, LA grade a esophagitis, esophagus stenosis post dilatation, tobacco abuse, alcohol use and anxiety and depression who presents to the hospital for speech difficulty which started 3 days before and is associated with difficulty swallowing.  Her primary care provider advised her to come to the hospital. In the ED:  CT of the head without contrast revealed a recent infarct involving a portion of the right internal capsule and immediately adjacent superior anterior right thalamus with a questionable old or infarct involving the anterior limb of the right external capsule.  MRI of the brain without contrast: 8 mm acute infarct in the left anterolateral thalamus/posterior limb internal capsule and extensive chronic small vessel changes-there were old hemorrhaged in the right superior cerebellum right basal ganglia and radiating white matter tracts   Hospital Course:  Principal Problem:   Acute CVA (cerebrovascular  accident)  -Ischemic infarct in the right internal capsule and superior anterior right thalamus -The patient has been having trouble with dysphagia and slurred speech -LDL is 71 -Hemoglobin A1c is 5.5 - ECHO shows no thrombus EF > 75%, Grade 1 dCHF - CTA of head and neck show no significant stenosis - she has been placed on a dysphagia diet and is doing well with this - Neuro recommendations are for ASA and Plavix x 3 wks and then Aspirin alone   Active Problems: Left upper abdominal pain -She was slightly tender in the left upper abdomen yesterday but it resolved later in the day and has not recurred  Alcohol abuse -She drinks at least 2 to 3 cans of beer a day -CIWA scale ordered- no signs of withdrawal - counseled to stop drinking alcohol  Nicotine abuse -NicoDerm patch ordered- counseled to stop smoking     Chronic hepatitis C without hepatic coma    Generalized anxiety disorder -She takes BuSpar and Ecitalopram which have been continued    History of esophageal stricture -Continue with PPI daily    Peripheral arterial disease ([place]) - cont Lipitor    Discharge Exam: Vitals:   12/31/20 0402 12/31/20 0729  BP: (!) 174/89 (!) 143/80  Pulse: 92 91  Resp: 17 18  Temp: 98.4 F (36.9 C) 97.9 F (36.6 C)  SpO2: 100% 100%   Vitals:   12/30/20 2042 12/30/20 2345 12/31/20 0402 12/31/20 0729  BP: 140/72 (!) 143/84 (!) 174/89 (!) 143/80  Pulse: 91 90 92 91  Resp: 16 16 17 18   Temp: 98.2 F (36.8 C) 97.8 F (36.6 C) 98.4 F (36.9 C) 97.9 F (36.6 C)  TempSrc: Oral Oral Oral Oral  SpO2: 97% 97% 100% 100%    General: Pt is alert, awake, not in acute distress Cardiovascular: RRR, S1/S2 +, no rubs, no gallops Respiratory: CTA bilaterally, no wheezing, no rhonchi Abdominal: Soft, NT, ND, bowel sounds + Extremities: no edema, no cyanosis   Discharge Instructions  Discharge Instructions    Diet - low sodium heart healthy   Complete by: As directed     Increase activity slowly   Complete by: As directed      Allergies as of 12/31/2020   No Known Allergies     Medication List    TAKE these medications   albuterol 108 (90 Base) MCG/ACT inhaler Commonly known as: VENTOLIN HFA Inhale 2 puffs into the lungs every 6 (six) hours as needed for shortness of breath or wheezing.   aspirin 81 MG EC tablet Take 1 tablet (81 mg total) by mouth daily. Swallow whole.   atorvastatin 80 MG tablet Commonly known as: LIPITOR Take 1 tablet (80 mg total) by mouth daily. What changed:   medication strength  how much to take   busPIRone 15 MG tablet Commonly known as: BUSPAR Take 1 tablet (15 mg total) by mouth 2 (two) times daily.   clopidogrel 75 MG tablet Commonly known as: PLAVIX Take 1 tablet (75 mg total) by mouth daily.   cyclobenzaprine 10 MG tablet Commonly known as: FLEXERIL Take 10 mg by mouth daily as needed for muscle spasms (Back pain).   diltiazem 180 MG 24 hr capsule Commonly known as: CARDIZEM CD TAKE 1 CAPSULE BY MOUTH DAILY. What changed: how much to take   escitalopram 20 MG tablet Commonly known as: LEXAPRO Take 20 mg by mouth daily.   famotidine 40 MG tablet Commonly known as: PEPCID Take 40 mg by mouth daily.   food thickener Powd Commonly known as: RESOURCE THICKENUP CLEAR Thicken liquids to nectar consistency   hydroxypropyl methylcellulose / hypromellose 2.5 % ophthalmic solution Commonly known as: ISOPTO TEARS / GONIOVISC Place 1 drop into both eyes 2 (two) times daily.   mirtazapine 30 MG tablet Commonly known as: REMERON Take 0.5 tablets (15 mg total) by mouth at bedtime. What changed: how much to take   multivitamin with minerals Tabs tablet Take 1 tablet by mouth daily.   nicotine 21 mg/24hr patch Commonly known as: NICODERM CQ - dosed in mg/24 hours Place 1 patch (21 mg total) onto the skin daily.   pantoprazole 40 MG tablet Commonly known as: PROTONIX Take 1 tablet (40 mg total) by  mouth daily.   thiamine 100 MG tablet Take 1 tablet (100 mg total) by mouth daily.       Follow-up Information    Andree Moro, DO Follow up.   Specialty: General Practice Contact information: [name] [address] [phone]              No Known Allergies    CT ANGIO HEAD W OR WO CONTRAST  Result Date: 12/29/2020 CLINICAL DATA:  Slurred speech EXAM: CT ANGIOGRAPHY HEAD AND NECK TECHNIQUE: Multidetector CT imaging of the head and neck was performed using the standard protocol during bolus administration of intravenous contrast. Multiplanar CT image reconstructions and MIPs were obtained to evaluate the vascular anatomy. Carotid stenosis measurements (when applicable) are obtained utilizing NASCET criteria, using the distal internal carotid diameter as the denominator. CONTRAST:  172mL OMNIPAQUE IOHEXOL 350 MG/ML SOLN COMPARISON:  None. FINDINGS: CTA NECK FINDINGS SKELETON: There is no bony spinal canal stenosis. No lytic or blastic lesion.  OTHER NECK: Normal pharynx, larynx and major salivary glands. No cervical lymphadenopathy. Unremarkable thyroid gland. UPPER CHEST: No pneumothorax or pleural effusion. No nodules or masses. AORTIC ARCH: There is minimal calcific atherosclerosis of the aortic arch. There is no aneurysm, dissection or hemodynamically significant stenosis of the visualized portion of the aorta. Conventional 3 vessel aortic branching pattern. The visualized proximal subclavian arteries are widely patent. RIGHT CAROTID SYSTEM: Normal without aneurysm, dissection or stenosis. LEFT CAROTID SYSTEM: Normal without aneurysm, dissection or stenosis. VERTEBRAL ARTERIES: Left dominant configuration. Both origins are clearly patent. There is no dissection, occlusion or flow-limiting stenosis to the skull base (V1-V3 segments). CTA HEAD FINDINGS POSTERIOR CIRCULATION: --Vertebral arteries: Normal V4 segments. --Inferior cerebellar arteries: Normal. --Basilar artery:  Normal. --Superior cerebellar arteries: Normal. --Posterior cerebral arteries (PCA): Normal. ANTERIOR CIRCULATION: --Intracranial internal carotid arteries: Normal. --Anterior cerebral arteries (ACA): Normal. Both A1 segments are present. Patent anterior communicating artery (a-comm). --Middle cerebral arteries (MCA): Normal. VENOUS SINUSES: As permitted by contrast timing, patent. ANATOMIC VARIANTS: None Review of the MIP images confirms the above findings. IMPRESSION: 1. No emergent large vessel occlusion or high-grade stenosis of the intracranial or cervical arteries. Aortic Atherosclerosis (ICD10-I70.0). Electronically Signed   By: Ulyses Jarred M.D.   On: 12/29/2020 19:10   CT HEAD WO CONTRAST  Result Date: 12/29/2020 CLINICAL DATA:  Dysarthria/slurred speech EXAM: CT HEAD WITHOUT CONTRAST TECHNIQUE: Contiguous axial images were obtained from the base of the skull through the vertex without intravenous contrast. COMPARISON:  December 25, 2007 FINDINGS: Brain: There is slight diffuse atrophy. Prominence of the cisterna magna is an anatomic variant. There is no intracranial mass, hemorrhage, extra-axial fluid collection, or midline shift. There is an age uncertain infarct involving the anterior limb of the right external capsule. There is decreased attenuation involving portions of the genu and immediately adjacent portions of the anterior and posterior limbs of the left internal capsule which appears acute. There is nearby decreased attenuation anterior superior left thalamus concerning for recent infarct in this area as well. Elsewhere, there is slight periventricular small vessel disease. Vascular: There is no appreciable hyperdense vessel. No appreciable vascular calcification. Skull: Bony calvarium appears intact. Sinuses/Orbits: Visualized paranasal sinuses are clear. Orbits appear symmetric bilaterally. Suspect mild drusen on each side. Other: Mastoid air cells are clear. IMPRESSION: 1. Suspect recent  infarct involving a portion of the right internal capsule and immediately adjacent superior anterior right thalamus. Question older infarct involving the anterior limb of the right external capsule. 2. Slight periventricular small vessel disease. Slight diffuse atrophy. 3. No demonstrable mass or hemorrhage. Electronically Signed   By: Lowella Grip III M.D.   On: 12/29/2020 15:03   CT ANGIO NECK W OR WO CONTRAST  Result Date: 12/29/2020 CLINICAL DATA:  Slurred speech EXAM: CT ANGIOGRAPHY HEAD AND NECK TECHNIQUE: Multidetector CT imaging of the head and neck was performed using the standard protocol during bolus administration of intravenous contrast. Multiplanar CT image reconstructions and MIPs were obtained to evaluate the vascular anatomy. Carotid stenosis measurements (when applicable) are obtained utilizing NASCET criteria, using the distal internal carotid diameter as the denominator. CONTRAST:  145mL OMNIPAQUE IOHEXOL 350 MG/ML SOLN COMPARISON:  None. FINDINGS: CTA NECK FINDINGS SKELETON: There is no bony spinal canal stenosis. No lytic or blastic lesion. OTHER NECK: Normal pharynx, larynx and major salivary glands. No cervical lymphadenopathy. Unremarkable thyroid gland. UPPER CHEST: No pneumothorax or pleural effusion. No nodules or masses. AORTIC ARCH: There is minimal calcific atherosclerosis of the aortic arch. There is  no aneurysm, dissection or hemodynamically significant stenosis of the visualized portion of the aorta. Conventional 3 vessel aortic branching pattern. The visualized proximal subclavian arteries are widely patent. RIGHT CAROTID SYSTEM: Normal without aneurysm, dissection or stenosis. LEFT CAROTID SYSTEM: Normal without aneurysm, dissection or stenosis. VERTEBRAL ARTERIES: Left dominant configuration. Both origins are clearly patent. There is no dissection, occlusion or flow-limiting stenosis to the skull base (V1-V3 segments). CTA HEAD FINDINGS POSTERIOR CIRCULATION: --Vertebral  arteries: Normal V4 segments. --Inferior cerebellar arteries: Normal. --Basilar artery: Normal. --Superior cerebellar arteries: Normal. --Posterior cerebral arteries (PCA): Normal. ANTERIOR CIRCULATION: --Intracranial internal carotid arteries: Normal. --Anterior cerebral arteries (ACA): Normal. Both A1 segments are present. Patent anterior communicating artery (a-comm). --Middle cerebral arteries (MCA): Normal. VENOUS SINUSES: As permitted by contrast timing, patent. ANATOMIC VARIANTS: None Review of the MIP images confirms the above findings. IMPRESSION: 1. No emergent large vessel occlusion or high-grade stenosis of the intracranial or cervical arteries. Aortic Atherosclerosis (ICD10-I70.0). Electronically Signed   By: Ulyses Jarred M.D.   On: 12/29/2020 19:10   MR BRAIN WO CONTRAST  Result Date: 12/29/2020 CLINICAL DATA:  Slurred speech beginning 2-3 days ago. EXAM: MRI HEAD WITHOUT CONTRAST TECHNIQUE: Multiplanar, multiecho pulse sequences of the brain and surrounding structures were obtained without intravenous contrast. COMPARISON:  12/29/2020 head CT FINDINGS: Brain: Diffusion imaging shows an 8 mm acute infarction in the left anterolateral thalamus/posterior limb internal capsule. No other acute infarction. Elsewhere, extensive chronic small-vessel ischemic changes are seen throughout the pons. Old hemorrhagic infarction of the right superior cerebellum. Old hemorrhagic infarction of the right basal ganglia and radiating white matter tracts. Chronic small-vessel ischemic changes elsewhere affecting the thalami and basal ganglia. Chronic small-vessel ischemic changes elsewhere affecting the hemispheric white matter. No large vessel territory infarction. No mass, acute hemorrhage, hydrocephalus or extra-axial collection. Vascular: Major vessels at the base of the brain show flow. Skull and upper cervical spine: Negative Sinuses/Orbits: Clear/normal Other: None IMPRESSION: 1. 8 mm acute infarction in the  left anterolateral thalamus/posterior limb internal capsule. 2. Extensive chronic small-vessel ischemic changes elsewhere throughout the brain as outlined above. 3. Old hemorrhagic infarctions of the right superior cerebellum, right basal ganglia and radiating white matter tracts. Electronically Signed   By: Nelson Chimes M.D.   On: 12/29/2020 16:29   DG Swallowing Func-Speech Pathology  Result Date: 12/30/2020 Objective Swallowing Evaluation: Type of Study: MBS-Modified Barium Swallow Study  Patient Details Name: Kara Bailey MRN: [id number] Date of Birth: 1956-04-04 Today's Date: 12/30/2020 Time: SLP Start Time (ACUTE ONLY): 1300 -SLP Stop Time (ACUTE ONLY): 1320 SLP Time Calculation (min) (ACUTE ONLY): 20 min Past Medical History: Past Medical History: Diagnosis Date . Anxiety  . Chest pain 08/2014  unspecified . Cirrhosis ([place])  . Depression  . Dizziness 08/2014 . Dyspnea 08/2014 . Emphysema lung ([place])  . GERD (gastroesophageal reflux disease)  . Hepatitis C  . Hepatitis C virus infection  . Hypertension  . PAC (premature atrial contraction) 10/11/2014 . PAD (peripheral artery disease) ([place])  . Palpitations  . PVC (premature ventricular contraction) 10/11/2014 . Shingles   left side . 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 Tachycardia 08/2014 . Ventral hernia  . Weakness 08/2014 Past Surgical History: Past Surgical History: Procedure Laterality Date . APPENDECTOMY   . CATARACT EXTRACTION, BILATERAL   . COLONOSCOPY   . COLONOSCOPY WITH PROPOFOL N/A 04/30/2016  Procedure: COLONOSCOPY WITH PROPOFOL;  Surgeon: Daneil Dolin, MD;  [facility];  Service: Endoscopy;  Laterality: N/A;  1230 . ESOPHAGOGASTRODUODENOSCOPY  approximately 2010 . ESOPHAGOGASTRODUODENOSCOPY (EGD) WITH PROPOFOL N/A 04/30/2016  Procedure: ESOPHAGOGASTRODUODENOSCOPY (EGD) WITH PROPOFOL;  Surgeon: Daneil Dolin, MD;  Location: [facility];  Service: Endoscopy;  Laterality: N/A; . LEFT HEART CATHETERIZATION WITH  CORONARY ANGIOGRAM N/A 10/29/2014  07-14-20- pt denies this Procedure: LEFT HEART CATHETERIZATION WITH CORONARY ANGIOGRAM;  Surgeon: Burnell Blanks, MD;  Location: [facility] CATH LAB;  Service: Cardiovascular;  Laterality: N/A; . Venia Minks DILATION N/A 04/30/2016  Procedure: Venia Minks DILATION;  Surgeon: Daneil Dolin, MD;  Location: [facility];  Service: Endoscopy;  Laterality: N/A; . POLYPECTOMY  04/30/2016  Procedure: POLYPECTOMY;  Surgeon: Daneil Dolin, MD;  Location: [facility];  Service: Endoscopy;;  Sigmoid colon polyp removed via hot snare . UPPER GASTROINTESTINAL ENDOSCOPY   HPI: Pt is a 64 y.o. female with medical history significant for peripheral vascular disease followed by cardiology, essential hypertension, hyperlipidemia, chronic anxiety/depression, GERD, LA grade A esophagitis, esophageal stenosis postop dilatation, tobacco use disorder, alcohol use disorder, who presented to [facility] ED due to speech difficulty of 3 days duration and difficulty with swallowing. MRI brain 2/17: . 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 Pt failed the [place] swallow screen due to coughing.  No data recorded Assessment / Plan / Recommendation CHL IP CLINICAL IMPRESSIONS 12/30/2020 Clinical Impression Pt presents with oropharyngeal dysphagia characterized by decreased bolus cohesion, weak lingual manipulation, and a pharyngeal delay. She demonstrated premature spillage to the pyriform sinuses with thin liquids, difficulty with A-P transit, intermittent lingual pumping, and base of tongue residue with solids. Penetration (PAS 3) was observed when pt was cued to consume larger boluses and with consecutive swallows. Aspiration (PAS 7) was noted once when pt unsuccessfully attempted to swallow a pill with thin liquids. A dysphagia 3 diet is recommended at this time. Though aspiration was only noted once during this study, considering intermittent penetration and her presentation  during at bedside with frequent coughing with thin liquids, nectar thick liquids are recommended at this time. SLP will follow for dysphagia treatment. SLP Visit Diagnosis Dysphagia, oropharyngeal phase (R13.12) Attention and concentration deficit following -- Frontal lobe and executive function deficit following -- Impact on safety and function Mild aspiration risk   CHL IP TREATMENT RECOMMENDATION 12/30/2020 Treatment Recommendations Therapy as outlined in treatment plan below   Prognosis 12/30/2020 Prognosis for Safe Diet Advancement Good Barriers to Reach Goals -- Barriers/Prognosis Comment -- CHL IP DIET RECOMMENDATION 12/30/2020 SLP Diet Recommendations Dysphagia 3 (Mech soft) solids;Nectar thick liquid Liquid Administration via Cup;Straw Medication Administration Whole meds with puree Compensations Slow rate;Small sips/bites Postural Changes Remain semi-upright after after feeds/meals (Comment);Seated upright at 90 degrees   No flowsheet data found.  CHL IP FOLLOW UP RECOMMENDATIONS 12/30/2020 Follow up Recommendations Home health SLP   CHL IP FREQUENCY AND DURATION 12/30/2020 Speech Therapy Frequency (ACUTE ONLY) min 2x/week Treatment Duration 2 weeks      CHL IP ORAL PHASE 12/30/2020 Oral Phase Impaired Oral - Pudding Teaspoon -- Oral - Pudding Cup -- Oral - Honey Teaspoon -- Oral - Honey Cup -- Oral - Nectar Teaspoon -- Oral - Nectar Cup Decreased bolus cohesion;Premature spillage;Weak lingual manipulation Oral - Nectar Straw Decreased bolus cohesion;Premature spillage;Weak lingual manipulation Oral - Thin Teaspoon -- Oral - Thin Cup Decreased bolus cohesion;Premature spillage;Weak lingual manipulation;Lingual pumping Oral - Thin Straw Decreased bolus cohesion;Premature spillage;Weak lingual manipulation;Lingual pumping Oral - Puree Decreased bolus cohesion;Premature spillage;Weak lingual manipulation Oral - Mech Soft Decreased bolus cohesion;Premature spillage;Weak lingual manipulation Oral -  Regular Decreased  bolus cohesion;Premature spillage;Weak lingual manipulation;Reduced posterior propulsion Oral - Multi-Consistency -- Oral - Pill -- Oral Phase - Comment --  CHL IP PHARYNGEAL PHASE 12/30/2020 Pharyngeal Phase Impaired Pharyngeal- Pudding Teaspoon -- Pharyngeal -- Pharyngeal- Pudding Cup -- Pharyngeal -- Pharyngeal- Honey Teaspoon -- Pharyngeal -- Pharyngeal- Honey Cup -- Pharyngeal -- Pharyngeal- Nectar Teaspoon -- Pharyngeal -- Pharyngeal- Nectar Cup Delayed swallow initiation-vallecula;Delayed swallow initiation-pyriform sinuses Pharyngeal -- Pharyngeal- Nectar Straw Delayed swallow initiation-vallecula;Delayed swallow initiation-pyriform sinuses Pharyngeal -- Pharyngeal- Thin Teaspoon -- Pharyngeal -- Pharyngeal- Thin Cup Delayed swallow initiation-vallecula;Delayed swallow initiation-pyriform sinuses;Penetration/Aspiration during swallow Pharyngeal Material enters airway, remains ABOVE vocal cords and not ejected out Pharyngeal- Thin Straw Delayed swallow initiation-vallecula;Delayed swallow initiation-pyriform sinuses;Penetration/Aspiration during swallow Pharyngeal Material enters airway, passes BELOW cords and not ejected out despite cough attempt by patient Pharyngeal- Puree Delayed swallow initiation-vallecula;Delayed swallow initiation-pyriform sinuses Pharyngeal -- Pharyngeal- Mechanical Soft -- Pharyngeal -- Pharyngeal- Regular Delayed swallow initiation-vallecula;Delayed swallow initiation-pyriform sinuses Pharyngeal -- Pharyngeal- Multi-consistency -- Pharyngeal -- Pharyngeal- Pill -- Pharyngeal -- Pharyngeal Comment --  CHL IP CERVICAL ESOPHAGEAL PHASE 12/30/2020 Cervical Esophageal Phase WFL Pudding Teaspoon -- Pudding Cup -- Honey Teaspoon -- Honey Cup -- Nectar Teaspoon -- Nectar Cup -- Nectar Straw -- Thin Teaspoon -- Thin Cup -- Thin Straw -- Puree -- Mechanical Soft -- Regular -- Multi-consistency -- Pill -- Cervical Esophageal Comment -- Kara Bailey, [place], [place] Office number [phone] Pager [phone] [place] 12/30/2020, 3:44 PM              ECHOCARDIOGRAM COMPLETE BUBBLE STUDY  Result Date: 12/30/2020    ECHOCARDIOGRAM REPORT   Patient Name:   Kara Bailey Date of Exam: 12/30/2020 Medical Rec #:  [id number]        Height:       65.0 in Accession #:    [id number]       Weight:       153.0 lb Date of Birth:  October 20, 1956         BSA:          1.765 m Patient Age:    89 years         BP:           177/92 mmHg Patient Gender: F                HR:           88 bpm. Exam Location:  Inpatient Procedure: 2D Echo, Cardiac Doppler, Color Doppler and Saline Contrast Bubble            Study Indications:    Stroke  History:        Patient has prior history of Echocardiogram examinations, most                 recent 11/08/2020. Arrythmias:PVC, Signs/Symptoms:Murmur; Risk                 Factors:Hypertension, Dyslipidemia and Current Smoker. PAD.  Sonographer:    Clayton Lefort RDCS (AE) Referring Phys: [id number] [place]  1. Left ventricular ejection fraction, by estimation, is >75%. The left ventricle has hyperdynamic function. The left ventricle has no regional wall motion abnormalities. There is moderate left ventricular hypertrophy. Left ventricular diastolic parameters are consistent with Grade I diastolic dysfunction (impaired relaxation).  2. Right ventricular systolic function is normal. The right ventricular size is normal.  3. The mitral valve is normal in structure. No evidence of mitral valve  regurgitation. No evidence of mitral stenosis.  4. The aortic valve is normal in structure. Aortic valve regurgitation is not visualized. No aortic stenosis is present.  5. The inferior vena cava is normal in size with greater than 50% respiratory variability, suggesting right atrial pressure of 3 mmHg.  6. Agitated saline contrast bubble study was negative, with no evidence of any interatrial shunt. Conclusion(s)/Recommendation(s): Challenging  windows. Could consider non urgent cardiac MRI evaluation in future to exclude hypertrophic cardiomyopathy. FINDINGS  Left Ventricle: Left ventricular ejection fraction, by estimation, is >75%. The left ventricle has hyperdynamic function. The left ventricle has no regional wall motion abnormalities. The left ventricular internal cavity size was normal in size. There is moderate left ventricular hypertrophy. Left ventricular diastolic parameters are consistent with Grade I diastolic dysfunction (impaired relaxation). Right Ventricle: The right ventricular size is normal. No increase in right ventricular wall thickness. Right ventricular systolic function is normal. Left Atrium: Left atrial size was normal in size. Right Atrium: Right atrial size was normal in size. Pericardium: There is no evidence of pericardial effusion. Mitral Valve: The mitral valve is normal in structure. No evidence of mitral valve regurgitation. No evidence of mitral valve stenosis. MV peak gradient, 6.9 mmHg. The mean mitral valve gradient is 2.0 mmHg. Tricuspid Valve: The tricuspid valve is normal in structure. Tricuspid valve regurgitation is not demonstrated. No evidence of tricuspid stenosis. Aortic Valve: The aortic valve is normal in structure. Aortic valve regurgitation is not visualized. No aortic stenosis is present. Aortic valve mean gradient measures 7.0 mmHg. Aortic valve peak gradient measures 11.0 mmHg. Aortic valve area, by VTI measures 2.61 cm. Pulmonic Valve: The pulmonic valve was normal in structure. Pulmonic valve regurgitation is not visualized. No evidence of pulmonic stenosis. Aorta: The aortic root is normal in size and structure. Venous: The inferior vena cava is normal in size with greater than 50% respiratory variability, suggesting right atrial pressure of 3 mmHg. IAS/Shunts: No atrial level shunt detected by color flow Doppler. Agitated saline contrast was given intravenously to evaluate for intracardiac  shunting. Agitated saline contrast bubble study was negative, with no evidence of any interatrial shunt.  LEFT VENTRICLE PLAX 2D LVIDd:         3.90 cm  Diastology LVIDs:         2.70 cm  LV e' medial:    7.18 cm/s LV PW:         1.50 cm  LV E/e' medial:  9.0 LV IVS:        1.70 cm  LV e' lateral:   4.79 cm/s LVOT diam:     1.70 cm  LV E/e' lateral: 13.5 LV SV:         70 LV SV Index:   40 LVOT Area:     2.27 cm  RIGHT VENTRICLE             IVC RV Basal diam:  2.90 cm     IVC diam: 0.80 cm RV S prime:     10.30 cm/s TAPSE (M-mode): 2.5 cm LEFT ATRIUM             Index       RIGHT ATRIUM          Index LA diam:        3.10 cm 1.76 cm/m  RA Area:     7.81 cm LA Vol (A2C):   50.1 ml 28.38 ml/m RA Volume:   11.90 ml 6.74 ml/m LA Vol (A4C):  37.6 ml 21.30 ml/m LA Biplane Vol: 44.9 ml 25.44 ml/m  AORTIC VALVE AV Area (Vmax):    2.06 cm AV Area (Vmean):   2.12 cm AV Area (VTI):     2.61 cm AV Vmax:           166.00 cm/s AV Vmean:          119.000 cm/s AV VTI:            0.269 m AV Peak Grad:      11.0 mmHg AV Mean Grad:      7.0 mmHg LVOT Vmax:         151.00 cm/s LVOT Vmean:        111.000 cm/s LVOT VTI:          0.309 m LVOT/AV VTI ratio: 1.15  AORTA Ao Root diam: 3.00 cm MITRAL VALVE MV Area (PHT): 3.74 cm     SHUNTS MV Area VTI:   2.36 cm     Systemic VTI:  0.31 m MV Peak grad:  6.9 mmHg     Systemic Diam: 1.70 cm MV Mean grad:  2.0 mmHg MV Vmax:       1.31 m/s MV Vmean:      69.2 cm/s MV Decel Time: 203 msec MV E velocity: 64.90 cm/s MV A velocity: 124.00 cm/s MV E/A ratio:  0.52 Candee Furbish MD Electronically signed by Candee Furbish MD Signature Date/Time: 12/30/2020/4:35:16 PM    Final      The results of significant diagnostics from this hospitalization (including imaging, microbiology, ancillary and laboratory) are listed below for reference.     Microbiology: Recent Results (from the past 240 hour(s))  Resp Panel by RT-PCR (Flu A&B, Covid) Nasopharyngeal Swab     Status: None   Collection Time:  12/29/20 12:52 PM   Specimen: Nasopharyngeal Swab; Nasopharyngeal(NP) swabs in vial transport medium  Result Value Ref Range Status   SARS Coronavirus 2 by RT PCR NEGATIVE NEGATIVE Final    Comment: (NOTE) SARS-CoV-2 target nucleic acids are NOT DETECTED.  The SARS-CoV-2 RNA is generally detectable in upper respiratory specimens during the acute phase of infection. The lowest concentration of SARS-CoV-2 viral copies this assay can detect is 138 copies/mL. A negative result does not preclude SARS-Cov-2 infection and should not be used as the sole basis for treatment or other patient management decisions. A negative result may occur with  improper specimen collection/handling, submission of specimen other than nasopharyngeal swab, presence of viral mutation(s) within the areas targeted by this assay, and inadequate number of viral copies(<138 copies/mL). A negative result must be combined with clinical observations, patient history, and epidemiological information. The expected result is Negative.  Fact Sheet for Patients:  EntrepreneurPulse.com.au  Fact Sheet for Healthcare Providers:  IncredibleEmployment.be  This test is no t yet approved or cleared by the [place] FDA and  has been authorized for detection and/or diagnosis of SARS-CoV-2 by FDA under an Emergency Use Authorization (EUA). This EUA will remain  in effect (meaning this test can be used) for the duration of the COVID-19 declaration under Section 564(b)(1) of the Act, 21 U.S.C.section 360bbb-3(b)(1), unless the authorization is terminated  or revoked sooner.       Influenza A by PCR NEGATIVE NEGATIVE Final   Influenza B by PCR NEGATIVE NEGATIVE Final    Comment: (NOTE) The Xpert Xpress SARS-CoV-2/FLU/RSV plus assay is intended as an aid in the diagnosis of influenza from Nasopharyngeal swab specimens and should not be used as  a sole basis for treatment. Nasal washings  and aspirates are unacceptable for Xpert Xpress SARS-CoV-2/FLU/RSV testing.  Fact Sheet for Patients: EntrepreneurPulse.com.au  Fact Sheet for Healthcare Providers: IncredibleEmployment.be  This test is not yet approved or cleared by the [place] FDA and has been authorized for detection and/or diagnosis of SARS-CoV-2 by FDA under an Emergency Use Authorization (EUA). This EUA will remain in effect (meaning this test can be used) for the duration of the COVID-19 declaration under Section 564(b)(1) of the Act, 21 U.S.C. section 360bbb-3(b)(1), unless the authorization is terminated or revoked.  Performed at [facility], [place] [address], [place], [place] [address]      Labs: BNP (last 3 results) No results for input(s): BNP in the last 8760 hours. Basic Metabolic Panel: Recent Labs  Lab 12/29/20 1247 12/29/20 2207 12/30/20 0400  NA 137  --  137  K 3.6  --  3.6  CL 104  --  106  CO2 24  --  22  GLUCOSE 105*  --  92  BUN 10  --  7*  CREATININE 0.96  --  0.94  CALCIUM 9.1  --  8.9  MG  --  2.5* 2.5*  PHOS  --  2.9  --    Liver Function Tests: Recent Labs  Lab 12/29/20 1247  AST 19  ALT 21  ALKPHOS 89  BILITOT 0.8  PROT 7.7  ALBUMIN 4.1   No results for input(s): LIPASE, AMYLASE in the last 168 hours. No results for input(s): AMMONIA in the last 168 hours. CBC: Recent Labs  Lab 12/29/20 1247 12/29/20 2207 12/30/20 0400  WBC 8.8 9.7 9.7  NEUTROABS 6.4  --   --   HGB 14.4 14.4 13.9  HCT 43.2 42.8 40.3  MCV 101.4* 99.1 98.8  PLT 259 283 272   Cardiac Enzymes: No results for input(s): CKTOTAL, CKMB, CKMBINDEX, TROPONINI in the last 168 hours. BNP: Invalid input(s): POCBNP CBG: No results for input(s): GLUCAP in the last 168 hours. D-Dimer No results for input(s): DDIMER in the last 72 hours. Hgb A1c Recent Labs    12/29/20 2207  HGBA1C 5.5   Lipid Profile Recent Labs    12/29/20 2207   CHOL 113  HDL 41  LDLCALC 58  TRIG 71  CHOLHDL 2.8   Thyroid function studies No results for input(s): TSH, T4TOTAL, T3FREE, THYROIDAB in the last 72 hours.  Invalid input(s): FREET3 Anemia work up No results for input(s): VITAMINB12, FOLATE, FERRITIN, TIBC, IRON, RETICCTPCT in the last 72 hours. Urinalysis    Component Value Date/Time   COLORURINE STRAW (A) 12/29/2020 1332   APPEARANCEUR CLEAR 12/29/2020 1332   LABSPEC 1.004 (L) 12/29/2020 1332   PHURINE 6.0 12/29/2020 1332   GLUCOSEU NEGATIVE 12/29/2020 1332   HGBUR SMALL (A) 12/29/2020 1332   BILIRUBINUR NEGATIVE 12/29/2020 1332   BILIRUBINUR small 06/15/2014 1038   KETONESUR NEGATIVE 12/29/2020 1332   PROTEINUR NEGATIVE 12/29/2020 1332   UROBILINOGEN 0.2 06/15/2014 1038   UROBILINOGEN 1.0 05/04/2013 1637   NITRITE NEGATIVE 12/29/2020 1332   LEUKOCYTESUR NEGATIVE 12/29/2020 1332   Sepsis Labs Invalid input(s): PROCALCITONIN,  WBC,  LACTICIDVEN Microbiology Recent Results (from the past 240 hour(s))  Resp Panel by RT-PCR (Flu A&B, Covid) Nasopharyngeal Swab     Status: None   Collection Time: 12/29/20 12:52 PM   Specimen: Nasopharyngeal Swab; Nasopharyngeal(NP) swabs in vial transport medium  Result Value Ref Range Status   SARS Coronavirus 2 by RT PCR NEGATIVE NEGATIVE Final  Comment: (NOTE) SARS-CoV-2 target nucleic acids are NOT DETECTED.  The SARS-CoV-2 RNA is generally detectable in upper respiratory specimens during the acute phase of infection. The lowest concentration of SARS-CoV-2 viral copies this assay can detect is 138 copies/mL. A negative result does not preclude SARS-Cov-2 infection and should not be used as the sole basis for treatment or other patient management decisions. A negative result may occur with  improper specimen collection/handling, submission of specimen other than nasopharyngeal swab, presence of viral mutation(s) within the areas targeted by this assay, and inadequate number of  viral copies(<138 copies/mL). A negative result must be combined with clinical observations, patient history, and epidemiological information. The expected result is Negative.  Fact Sheet for Patients:  EntrepreneurPulse.com.au  Fact Sheet for Healthcare Providers:  IncredibleEmployment.be  This test is no t yet approved or cleared by the [place] FDA and  has been authorized for detection and/or diagnosis of SARS-CoV-2 by FDA under an Emergency Use Authorization (EUA). This EUA will remain  in effect (meaning this test can be used) for the duration of the COVID-19 declaration under Section 564(b)(1) of the Act, 21 U.S.C.section 360bbb-3(b)(1), unless the authorization is terminated  or revoked sooner.       Influenza A by PCR NEGATIVE NEGATIVE Final   Influenza B by PCR NEGATIVE NEGATIVE Final    Comment: (NOTE) The Xpert Xpress SARS-CoV-2/FLU/RSV plus assay is intended as an aid in the diagnosis of influenza from Nasopharyngeal swab specimens and should not be used as a sole basis for treatment. Nasal washings and aspirates are unacceptable for Xpert Xpress SARS-CoV-2/FLU/RSV testing.  Fact Sheet for Patients: EntrepreneurPulse.com.au  Fact Sheet for Healthcare Providers: IncredibleEmployment.be  This test is not yet approved or cleared by the [place] FDA and has been authorized for detection and/or diagnosis of SARS-CoV-2 by FDA under an Emergency Use Authorization (EUA). This EUA will remain in effect (meaning this test can be used) for the duration of the COVID-19 declaration under Section 564(b)(1) of the Act, 21 U.S.C. section 360bbb-3(b)(1), unless the authorization is terminated or revoked.  Performed at [facility], [place] [address], [place], [place] [address]      Time coordinating discharge in minutes: 65  SIGNED:   Debbe Odea, MD  [facility] 12/31/2020, 9:45 AM

## 2020-12-31 NOTE — Progress Notes (Signed)
Physical Therapy Treatment Patient Details Name: Kara Bailey MRN: [id number] DOB: February 18, 1956 Today's Date: 12/31/2020    History of Present Illness 65 y.o. female with a history of smoking, peripheral artery disease, hypertension who presents with right-sided weakness and difficulty speaking that has been going on since waking up on Thursday. MRI demonstrated small thalamic infarct. Pt also reports recent hematemesis.    PT Comments    Pt making good progress with mobility having no LOB or significant safety concerns when using rollator for mobility on even surfaces this date or negotiating several stairs with bil hand rails, min guard-supervision for all for safety. Pt educated and demonstrates understanding of safe utilization of rollator and safe negotiation of stairs upon d/c. Will continue to follow acutely. Current recommendations remain appropriate.     Follow Up Recommendations  Home health PT;Supervision - Intermittent     Equipment Recommendations   (4 wheeled walker)    Recommendations for Other Services       Precautions / Restrictions Precautions Precautions: Fall Precaution Comments: Poor attention to R and safety awareness Restrictions Weight Bearing Restrictions: No    Mobility  Bed Mobility Overal bed mobility: Needs Assistance Bed Mobility: Supine to Sit     Supine to sit: Supervision Sit to supine: Supervision   General bed mobility comments: Pt requiring increased time to come to sit R EOB with bed flat and no use of rails, supervision for safety.    Transfers Overall transfer level: Needs assistance Equipment used: 4-wheeled walker Transfers: Sit to/from Stand Sit to Stand: Supervision         General transfer comment: Pt able to come to stand safely without LOB, extra time to power up. Supervision for safety. Appropriate locking of breaks prior to transfers by pt.  Ambulation/Gait Ambulation/Gait assistance: Supervision;Min guard Gait  Distance (Feet): 250 Feet Assistive device: Rolling walker (2 wheeled);None Gait Pattern/deviations: Step-through pattern Gait velocity: reduced Gait velocity interpretation: <1.8 ft/sec, indicate of risk for recurrent falls General Gait Details: pt with slowed step-through gait initially but increased speed with distance. Cues to increase bil feet clearance and maintain proximity to rollator, success.   Stairs Stairs: Yes Stairs assistance: Min guard Stair Management: Two rails;Alternating pattern;Step to pattern Number of Stairs: 4 General stair comments: Ascending and descending with bil hand rails alternating between reciprocal and step-to gait pattern with pt education on leading up with good and down with bad leg. No LOB, min guard for safety.   Wheelchair Mobility    Modified Rankin (Stroke Patients Only) Modified Rankin (Stroke Patients Only) Pre-Morbid Rankin Score: No symptoms Modified Rankin: Moderately severe disability     Balance Overall balance assessment: Needs assistance Sitting-balance support: No upper extremity supported;Feet supported Sitting balance-Leahy Scale: Good     Standing balance support: No upper extremity supported;Single extremity supported;Bilateral upper extremity supported Standing balance-Leahy Scale: Good Standing balance comment: Alternating no UE-2 UE support with mild reach off BOS to brush teeth at sink.                            Cognition Arousal/Alertness: Awake/alert Behavior During Therapy: WFL for tasks assessed/performed Overall Cognitive Status: Within Functional Limits for tasks assessed Area of Impairment: Following commands;Safety/judgement;Awareness;Problem solving;Attention                   Current Attention Level: Selective   Following Commands: Follows multi-step commands inconsistently Safety/Judgement: Decreased awareness of safety;Decreased awareness of deficits Awareness:  Emergent Problem  Solving: Requires verbal cues General Comments: Pt appropriately following commands and remembering cues to ensure safety during tasks performed today.      Exercises      General Comments General comments (skin integrity, edema, etc.): Educated pt on safety with rollator and stairs.      Pertinent Vitals/Pain Pain Assessment: No/denies pain    Home Living                      Prior Function            PT Goals (current goals can now be found in the care plan section) Acute Rehab PT Goals Patient Stated Goal: to go home to help care for significant other PT Goal Formulation: With patient Time For Goal Achievement: 01/13/21 Potential to Achieve Goals: Good Progress towards PT goals: Progressing toward goals    Frequency    Min 4X/week      PT Plan Current plan remains appropriate    Co-evaluation              AM-PAC PT "6 Clicks" Mobility   Outcome Measure  Help needed turning from your back to your side while in a flat bed without using bedrails?: A Little Help needed moving from lying on your back to sitting on the side of a flat bed without using bedrails?: A Little Help needed moving to and from a bed to a chair (including a wheelchair)?: A Little Help needed standing up from a chair using your arms (e.g., wheelchair or bedside chair)?: A Little Help needed to walk in hospital room?: A Little Help needed climbing 3-5 steps with a railing? : A Little 6 Click Score: 18    End of Session Equipment Utilized During Treatment: Gait belt Activity Tolerance: Patient tolerated treatment well Patient left: with call bell/phone within reach;in bed;with bed alarm set   PT Visit Diagnosis: Unsteadiness on feet (R26.81);Muscle weakness (generalized) (M62.81);Other abnormalities of gait and mobility (R26.89)     Time: 4166-0630 PT Time Calculation (min) (ACUTE ONLY): 22 min  Charges:  $Gait Training: 8-22 mins                     Moishe Spice, PT,  DPT Acute Rehabilitation Services  Pager: [phone] Office: [place] 12/31/2020, 1:09 PM

## 2021-01-02 ENCOUNTER — Ambulatory Visit (HOSPITAL_COMMUNITY): Admission: RE | Admit: 2021-01-02 | Payer: Medicare HMO | Source: Home / Self Care | Admitting: Cardiovascular Disease

## 2021-01-02 ENCOUNTER — Encounter (HOSPITAL_COMMUNITY): Admission: RE | Payer: Medicare HMO | Source: Home / Self Care

## 2021-01-02 SURGERY — ABDOMINAL AORTOGRAM W/LOWER EXTREMITY
Anesthesia: LOCAL

## 2021-01-05 ENCOUNTER — Ambulatory Visit: Payer: Medicare HMO | Admitting: Gastroenterology

## 2021-01-11 ENCOUNTER — Encounter (HOSPITAL_COMMUNITY): Payer: Medicare HMO

## 2021-01-11 ENCOUNTER — Ambulatory Visit (HOSPITAL_COMMUNITY): Payer: Medicare HMO

## 2021-01-15 NOTE — Progress Notes (Deleted)
Cardiology Office Note:   Date:  01/15/2021  NAME:  Kara Bailey    MRN: [id number] DOB:  1956/09/17   PCP:  Andree Moro, DO  Cardiologist:  No primary care provider on file.  Electrophysiologist:  None   Referring MD: Andree Moro, DO   No chief complaint on file. ***  History of Present Illness:   Kara Bailey is a 65 y.o. female with a hx of HTN, HLD, PAD, CVA who presents for follow-up. Recent admission for CVA (ICAD). Found to have PAD and angiography delayed due to CVA.    Problem List 1. HTN 2. GERD/stricture 3. Tobacco abuse  -40 pack year 4. PAD -L CFA/SFA 30-49% -R CFA 50-74% 5. CVA -L [place] stroke (ICAD) 12/2020 6. HLD -T chol 113, LDL 58, HDL 41, TG 71 -A1c 5.5  Past Medical History: Past Medical History:  Diagnosis Date  . Anxiety   . Chest pain 08/2014   unspecified  . Cirrhosis ([place])   . Depression   . Dizziness 08/2014  . Dyspnea 08/2014  . Emphysema lung ([place])   . GERD (gastroesophageal reflux disease)   . Hepatitis C   . Hiatal hernia   . Hypertension   . PAC (premature atrial contraction) 10/11/2014  . PAD (peripheral artery disease) ([place])   . Palpitations   . PVC (premature ventricular contraction) 10/11/2014  . 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 Tachycardia 08/2014  . Ventral hernia   . Weakness 08/2014    Past Surgical History: Past Surgical History:  Procedure Laterality Date  . APPENDECTOMY    . CATARACT EXTRACTION, BILATERAL    . COLONOSCOPY    . COLONOSCOPY WITH PROPOFOL N/A 04/30/2016   Procedure: COLONOSCOPY WITH PROPOFOL;  Surgeon: Daneil Dolin, MD;  [facility];  Service: Endoscopy;  Laterality: N/A;  1230  . ESOPHAGOGASTRODUODENOSCOPY     approximately 2010  . ESOPHAGOGASTRODUODENOSCOPY (EGD) WITH PROPOFOL N/A 04/30/2016   Procedure: ESOPHAGOGASTRODUODENOSCOPY (EGD) WITH PROPOFOL;  Surgeon: Daneil Dolin, MD;  [facility];  Service: Endoscopy;  Laterality: N/A;  .  LEFT HEART CATHETERIZATION WITH CORONARY ANGIOGRAM N/A 10/29/2014   07-14-20- pt denies this Procedure: LEFT HEART CATHETERIZATION WITH CORONARY ANGIOGRAM;  Surgeon: Burnell Blanks, MD;  Location: [facility] CATH LAB;  Service: Cardiovascular;  Laterality: N/A;  . Venia Minks DILATION N/A 04/30/2016   Procedure: Venia Minks DILATION;  Surgeon: Daneil Dolin, MD;  [facility];  Service: Endoscopy;  Laterality: N/A;  . POLYPECTOMY  04/30/2016   Procedure: POLYPECTOMY;  Surgeon: Daneil Dolin, MD;  [facility];  Service: Endoscopy;;  Sigmoid colon polyp removed via hot snare  . UPPER GASTROINTESTINAL ENDOSCOPY      Current Medications: No outpatient medications have been marked as taking for the 01/16/21 encounter (Appointment) with Geralynn Rile, MD.   Current Facility-Administered Medications for the 01/16/21 encounter (Appointment) with O'Neal, Cassie Freer, MD  Medication  . sodium chloride flush (NS) 0.9 % injection 3 mL     Allergies:    Patient has no known allergies.   Social History: Social History   Socioeconomic History  . Marital status: Divorced    Spouse name: Not on file  . Number of children: 2  . Years of education: Not on file  . Highest education level: Not on file  Occupational History  . Occupation: retired  Tobacco Use  . Smoking status: Current Every Day Smoker    Packs/day: 0.50  Years: 40.00    Pack years: 20.00    Types: Cigarettes    Start date: 11/12/1968  . Smokeless tobacco: Never Used  . Tobacco comment: cutting back, wearing patches  Vaping Use  . Vaping Use: Never used  Substance and Sexual Activity  . Alcohol use: Not Currently    Alcohol/week: 0.0 standard drinks    Comment: beer on weekends (6-pack)  . Drug use: No  . Sexual activity: Not Currently  Other Topics Concern  . Not on file  Social History Narrative  . Not on file   Social Determinants of Health   Financial Resource Strain: Not on file  Food  Insecurity: Not on file  Transportation Needs: Not on file  Physical Activity: Not on file  Stress: Not on file  Social Connections: Not on file     Family History: The patient's ***family history includes Alzheimer's disease in her father; CVA in her father; Heart attack in her mother; Heart disease in her mother; Heart failure in her mother; Hyperlipidemia in her mother; Hypertension in her mother. There is no history of Cancer, Colon cancer, Esophageal cancer, Rectal cancer, or Stomach cancer.  ROS:   All other ROS reviewed and negative. Pertinent positives noted in the HPI.     EKGs/Labs/Other Studies Reviewed:   The following studies were personally reviewed by me today:  EKG:  EKG is *** ordered today.  The ekg ordered today demonstrates ***, and was personally reviewed by me.   TTE 12/30/2020 1. Left ventricular ejection fraction, by estimation, is >75%. The left  ventricle has hyperdynamic function. The left ventricle has no regional  wall motion abnormalities. There is moderate left ventricular hypertrophy.  Left ventricular diastolic  parameters are consistent with Grade I diastolic dysfunction (impaired  relaxation).  2. Right ventricular systolic function is normal. The right ventricular  size is normal.  3. The mitral valve is normal in structure. No evidence of mitral valve  regurgitation. No evidence of mitral stenosis.  4. The aortic valve is normal in structure. Aortic valve regurgitation is  not visualized. No aortic stenosis is present.  5. The inferior vena cava is normal in size with greater than 50%  respiratory variability, suggesting right atrial pressure of 3 mmHg.  6. Agitated saline contrast bubble study was negative, with no evidence  of any interatrial shunt.   Left Heart Cath 2015 -Normal coronary arteries   Recent Labs: 12/29/2020: ALT 21 12/30/2020: BUN 7; Creatinine, Ser 0.94; Hemoglobin 13.9; Magnesium 2.5; Platelets 272; Potassium 3.6;  Sodium 137   Recent Lipid Panel    Component Value Date/Time   CHOL 113 12/29/2020 2207   CHOL 156 08/14/2017 1610   TRIG 71 12/29/2020 2207   HDL 41 12/29/2020 2207   HDL 39 (L) 08/14/2017 1610   CHOLHDL 2.8 12/29/2020 2207   VLDL 14 12/29/2020 2207   LDLCALC 58 12/29/2020 2207   [place] 95 08/14/2017 1610    Physical Exam:   VS:  There were no vitals taken for this visit.   Wt Readings from Last 3 Encounters:  12/20/20 153 lb (69.4 kg)  10/14/20 149 lb (67.6 kg)  07/14/20 145 lb 9.6 oz (66 kg)    General: Well nourished, well developed, in no acute distress Head: Atraumatic, normal size  Eyes: PEERLA, EOMI  Neck: Supple, no JVD Endocrine: No thryomegaly Cardiac: Normal S1, S2; RRR; no murmurs, rubs, or gallops Lungs: Clear to auscultation bilaterally, no wheezing, rhonchi or rales  Abd: Soft, nontender,  no hepatomegaly  Ext: No edema, pulses 2+ Musculoskeletal: No deformities, BUE and BLE strength normal and equal Skin: Warm and dry, no rashes   Neuro: Alert and oriented to person, place, time, and situation, CNII-XII grossly intact, no focal deficits  Psych: Normal mood and affect   ASSESSMENT:   Kara Bailey is a 66 y.o. female who presents for the following: No diagnosis found.  PLAN:   There are no diagnoses linked to this encounter.  Disposition: No follow-ups on file.  Medication Adjustments/Labs and Tests Ordered: Current medicines are reviewed at length with the patient today.  Concerns regarding medicines are outlined above.  No orders of the defined types were placed in this encounter.  No orders of the defined types were placed in this encounter.   There are no Patient Instructions on file for this visit.   Time Spent with Patient: I have spent a total of *** minutes with patient reviewing hospital notes, telemetry, EKGs, labs and examining the patient as well as establishing an assessment and plan that was discussed with the patient.  > 50% of  time was spent in direct patient care.  Signed, Addison Naegeli. Audie Box, [place]  [address] [place], [place] [address] [phone]  01/15/2021 4:09 PM

## 2021-01-16 ENCOUNTER — Ambulatory Visit: Payer: Medicare HMO | Admitting: Cardiovascular Disease

## 2021-01-16 ENCOUNTER — Telehealth: Payer: Self-pay | Admitting: Cardiovascular Disease

## 2021-01-16 DIAGNOSIS — I739 Peripheral vascular disease, unspecified: Secondary | ICD-10-CM

## 2021-01-16 DIAGNOSIS — Z720 Tobacco use: Secondary | ICD-10-CM

## 2021-01-16 DIAGNOSIS — E782 Mixed hyperlipidemia: Secondary | ICD-10-CM

## 2021-01-16 NOTE — Telephone Encounter (Signed)
Called pt to confirm that she knew about appointment already made for 01/20/21. Pt states that she does know about this appointment, explained to pt that we could re-schedule her appointments at this OV. Pt verbalizes understanding and states that she will keep this appointment.

## 2021-01-16 NOTE — Telephone Encounter (Signed)
New Message:      Pt said she was supposed to have had a procedure 2 or 3 weeks ago, she had to cx  because she had a stroke. She said she is now ready to reschedule please.

## 2021-01-20 ENCOUNTER — Other Ambulatory Visit: Payer: Self-pay

## 2021-01-20 ENCOUNTER — Ambulatory Visit (INDEPENDENT_AMBULATORY_CARE_PROVIDER_SITE_OTHER): Payer: Medicare HMO | Admitting: Cardiovascular Disease

## 2021-01-20 ENCOUNTER — Encounter: Payer: Self-pay | Admitting: Cardiovascular Disease

## 2021-01-20 VITALS — BP 132/70 | HR 92 | Ht 65.0 in | Wt 154.0 lb

## 2021-01-20 DIAGNOSIS — I739 Peripheral vascular disease, unspecified: Secondary | ICD-10-CM

## 2021-01-20 NOTE — Progress Notes (Signed)
Ms. Owusu returns today for follow-up.  She was scheduled to have PV angiography and intervention last month but unfortunately was admitted with a stroke that affected her speech.  She is still somewhat dysarthric although he does not affect his sensation or motor function.  She is trying to stop smoking and she is wearing a patch.  I told her that we will delay peripheral vascular angiography and intervention for 3 months until she gets over the sequela of her stroke.  Lorretta Harp, M.D., [place], [facility], [place] [address] [place], [place]  [address]  [phone] sinus Ms. Dona ridiculously back in 3 months 3 01/20/2021 9:55 AM

## 2021-01-20 NOTE — Patient Instructions (Signed)
Medication Instructions:  Your physician recommends that you continue on your current medications as directed. Please refer to the Current Medication list given to you today.  *If you need a refill on your cardiac medications before your next appointment, please call your pharmacy*   Follow-Up: At [facility], you and your health needs are our priority.  As part of our continuing mission to provide you with exceptional heart care, we have created designated Provider Care Teams.  These Care Teams include your primary Cardiologist (physician) and Advanced Practice Providers (APPs -  Physician Assistants and Nurse Practitioners) who all work together to provide you with the care you need, when you need it.  We recommend signing up for the patient portal called "MyChart".  Sign up information is provided on this After Visit Summary.  MyChart is used to connect with patients for Virtual Visits (Telemedicine).  Patients are able to view lab/test results, encounter notes, upcoming appointments, etc.  Non-urgent messages can be sent to your provider as well.   To learn more about what you can do with MyChart, go to [email or web address].    Your next appointment:   3 month(s)  The format for your next appointment:   In Person  Provider:   Jonathan Berry, MD 

## 2021-01-25 NOTE — Progress Notes (Unsigned)
Cardiology Office Note:   Date:  01/26/2021  NAME:  Kara Bailey    MRN: [id number] DOB:  Jul 18, 1956   PCP:  Andree Moro, DO  Cardiologist:  No primary care provider on file.   Referring MD: Andree Moro, DO   Chief Complaint  Patient presents with  . Follow-up   History of Present Illness:   Kara Bailey is a 65 y.o. female with a hx of hypertension, tobacco abuse, PAD, stroke who presents for follow-up.  We had sent her to see Dr. Alvester Chou for peripheral angiography in the setting of PAD.  She reports that she has heaviness in her left leg.  This occurs with walking short distances.  They have plans for peripheral evaluation in the Cath Lab however this was circumvented by stroke.  Recently admitted in February with a lacunar infarct.  Suspect this was related to elevated blood pressure and hyperlipidemia.  Does not appear to be cardioembolic in nature.  She has been doing well.  She does have some residual deficits that are improving.  She was placed on aspirin and Plavix.  No issues with bleeding.  Her most recent lipid profile shows her LDL cholesterol is 58 which is at goal.  She is not diabetic.  She is still smoking 1 to 2 cigarettes daily.  She was advised to quit.  She will undergo peripheral angiography after 3 months has passed from her stroke.  No rush for this at the moment.  Her blood pressure is well controlled 120/64.  She denies any symptoms of angina or chest pain.  BP is well controlled on diltiazem.  Problem List 1. HTN 2. GERD/stricture 3. Tobacco abuse  -40 pack year history managed 4. LHC 10/29/2014 -normal coronaries  5. PAD -R CFA 30-49% -R SFA 30-49% -L CFA 50-74% -L SFA 50-74% 6. 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 Anxiety   . Chest pain 08/2014   unspecified  . Cirrhosis ([place])   . Depression   . Dizziness 08/2014  . Dyspnea 08/2014  . Emphysema lung ([place])   . GERD  (gastroesophageal reflux disease)   . Hepatitis C   . Hiatal hernia   . Hypertension   . PAC (premature atrial contraction) 10/11/2014  . PAD (peripheral artery disease) ([place])   . Palpitations   . PVC (premature ventricular contraction) 10/11/2014  . 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 Tachycardia 08/2014  . Ventral hernia   . Weakness 08/2014    Past Surgical History: Past Surgical History:  Procedure Laterality Date  . APPENDECTOMY    . CATARACT EXTRACTION, BILATERAL    . COLONOSCOPY    . COLONOSCOPY WITH PROPOFOL N/A 04/30/2016   Procedure: COLONOSCOPY WITH PROPOFOL;  Surgeon: Daneil Dolin, MD;  [facility];  Service: Endoscopy;  Laterality: N/A;  1230  . ESOPHAGOGASTRODUODENOSCOPY     approximately 2010  . ESOPHAGOGASTRODUODENOSCOPY (EGD) WITH PROPOFOL N/A 04/30/2016   Procedure: ESOPHAGOGASTRODUODENOSCOPY (EGD) WITH PROPOFOL;  Surgeon: Daneil Dolin, MD;  [facility];  Service: Endoscopy;  Laterality: N/A;  . LEFT HEART CATHETERIZATION WITH CORONARY ANGIOGRAM N/A 10/29/2014   07-14-20- pt denies this Procedure: LEFT HEART CATHETERIZATION WITH CORONARY ANGIOGRAM;  Surgeon: Burnell Blanks, MD;  Location: [facility] CATH LAB;  Service: Cardiovascular;  Laterality: N/A;  . Venia Minks DILATION N/A 04/30/2016   Procedure: Venia Minks DILATION;  Surgeon: Daneil Dolin, MD;  [facility];  Service: Endoscopy;  Laterality: N/A;  . POLYPECTOMY  04/30/2016   Procedure: POLYPECTOMY;  Surgeon: Daneil Dolin, MD;  [facility];  Service: Endoscopy;;  Sigmoid colon polyp removed via hot snare  . UPPER GASTROINTESTINAL ENDOSCOPY      Current Medications: Current Meds  Medication Sig  . albuterol (VENTOLIN HFA) 108 (90 Base) MCG/ACT inhaler Inhale 2 puffs into the lungs every 6 (six) hours as needed for shortness of breath or wheezing.  Mar[place] Kitchen aspirin EC 81 MG EC tablet Take 1 tablet (81 mg total) by mouth daily. Swallow whole.  Marland Kitchen  atorvastatin (LIPITOR) 80 MG tablet Take 1 tablet (80 mg total) by mouth daily.  . busPIRone (BUSPAR) 15 MG tablet Take 1 tablet (15 mg total) by mouth 2 (two) times daily.  . clopidogrel (PLAVIX) 75 MG tablet Take 1 tablet (75 mg total) by mouth daily.  . cyclobenzaprine (FLEXERIL) 10 MG tablet Take 10 mg by mouth daily as needed for muscle spasms (Back pain).  Marland Kitchen diltiazem (CARDIZEM CD) 180 MG 24 hr capsule TAKE 1 CAPSULE BY MOUTH DAILY. (Patient taking differently: Take by mouth daily.)  . escitalopram (LEXAPRO) 20 MG tablet Take 20 mg by mouth daily.  . famotidine (PEPCID) 40 MG tablet Take 40 mg by mouth daily.  . food thickener (RESOURCE THICKENUP CLEAR) POWD Thicken liquids to nectar consistency  . hydroxypropyl methylcellulose / hypromellose (ISOPTO TEARS / GONIOVISC) 2.5 % ophthalmic solution Place 1 drop into both eyes 2 (two) times daily.  . Maltodextrin-Xanthan Gum (RESOURCE THICKENUP CLEAR) POWD Take 120 g by mouth as needed.  . mirtazapine (REMERON) 30 MG tablet Take 0.5 tablets (15 mg total) by mouth at bedtime. (Patient taking differently: Take 30 mg by mouth at bedtime.)  . Multiple Vitamin (MULTIVITAMIN WITH MINERALS) TABS tablet Take 1 tablet by mouth daily.  . nicotine (NICODERM CQ - DOSED IN MG/24 HOURS) 21 mg/24hr patch Place 1 patch (21 mg total) onto the skin daily.  . pantoprazole (PROTONIX) 40 MG tablet Take 1 tablet (40 mg total) by mouth daily.  Marland Kitchen thiamine 100 MG tablet Take 1 tablet (100 mg total) by mouth daily.   Current Facility-Administered Medications for the 01/26/21 encounter (Office Visit) with Geralynn Rile, MD  Medication  . sodium chloride flush (NS) 0.9 % injection 3 mL     Allergies:    Patient has no known allergies.   Social History: Social History   Socioeconomic History  . Marital status: Divorced    Spouse name: Not on file  . Number of children: 2  . Years of education: Not on file  . Highest education level: Not on file   Occupational History  . Occupation: retired  Tobacco Use  . Smoking status: Current Every Day Smoker    Packs/day: 0.50    Years: 40.00    Pack years: 20.00    Types: Cigarettes    Start date: 11/12/1968  . Smokeless tobacco: Never Used  . Tobacco comment: cutting back, wearing patches  Vaping Use  . Vaping Use: Never used  Substance and Sexual Activity  . Alcohol use: Not Currently    Alcohol/week: 0.0 standard drinks    Comment: beer on weekends (6-pack)  . Drug use: No  . Sexual activity: Not Currently  Other Topics Concern  . Not on file  Social History Narrative  . Not on file   Social Determinants of Health   Financial Resource Strain: Not on file  Food Insecurity: Not  on file  Transportation Needs: Not on file  Physical Activity: Not on file  Stress: Not on file  Social Connections: Not on file     Family History: The patient's family history includes Alzheimer's disease in her father; CVA in her father; Heart attack in her mother; Heart disease in her mother; Heart failure in her mother; Hyperlipidemia in her mother; Hypertension in her mother. There is no history of Cancer, Colon cancer, Esophageal cancer, Rectal cancer, or Stomach cancer.  ROS:   All other ROS reviewed and negative. Pertinent positives noted in the HPI.     EKGs/Labs/Other Studies Reviewed:   The following studies were personally reviewed by me today:  Recent Labs: 12/29/2020: ALT 21 12/30/2020: BUN 7; Creatinine, Ser 0.94; Hemoglobin 13.9; Magnesium 2.5; Platelets 272; Potassium 3.6; Sodium 137   Recent Lipid Panel    Component Value Date/Time   CHOL 113 12/29/2020 2207   CHOL 156 08/14/2017 1610   TRIG 71 12/29/2020 2207   HDL 41 12/29/2020 2207   HDL 39 (L) 08/14/2017 1610   CHOLHDL 2.8 12/29/2020 2207   VLDL 14 12/29/2020 2207   LDLCALC 58 12/29/2020 2207   LDLCALC 95 08/14/2017 1610    Physical Exam:   VS:  BP 120/64 (BP Location: Left Arm, Patient Position: Sitting, Cuff  Size: Normal)   Pulse 84   Ht 5' 2"  (1.575 m)   Wt 146 lb (66.2 kg)   BMI 26.70 kg/m    Wt Readings from Last 3 Encounters:  01/26/21 146 lb (66.2 kg)  01/20/21 154 lb (69.9 kg)  12/20/20 153 lb (69.4 kg)    General: Well nourished, well developed, in no acute distress Head: Atraumatic, normal size  Eyes: PEERLA, EOMI  Neck: Supple, no JVD Endocrine: No thryomegaly Cardiac: Normal S1, S2; RRR; no murmurs, rubs, or gallops Lungs: Clear to auscultation bilaterally, no wheezing, rhonchi or rales  Abd: Soft, nontender, no hepatomegaly  Ext: Diminished pulses in the left lower extremity Musculoskeletal: No deformities, BUE and BLE strength normal and equal Skin: Warm and dry, no rashes   Neuro: Alert and oriented to person, place, time, and situation, CNII-XII grossly intact, no focal deficits  Psych: Normal mood and affect   ASSESSMENT:   CELE MOTE is a 65 y.o. female who presents for the following: 1. Peripheral arterial disease (HCC)   2. Pain of left lower extremity   3. Primary hypertension   4. Mixed hyperlipidemia   5. Tobacco abuse     PLAN:   1. Peripheral arterial disease (HCC) 2. Pain of left lower extremity -Evaluated last year with poor pulses.  Lower extremity arterial ultrasound demonstrated significant PAD in the left common femoral and left superficial femoral arteries.  She met with Dr. Alvester Chou and had plans for peripheral evaluation with angiography.  This was circumvented by recent stroke. -She will continue her aspirin and statin.  Most recent LDL is at goal. -They have plans to wait 3 months after her stroke before they pursue peripheral angiography.  She still describes symptoms of heaviness in her left leg when she walks.  Symptoms occur with minimal exertion.  I suspect she will have very significant PAD.  3. Primary hypertension -Well-controlled on current medications.  4. Mixed hyperlipidemia -Continue Lipitor 80 mg daily.  Most recent LDL  cholesterol is less than 70.  5. Tobacco abuse -Smoking cessation counseling provided in office.  Disposition: Return in about 1 year (around 01/26/2022).  Medication Adjustments/Labs and Tests Ordered: Current  medicines are reviewed at length with the patient today.  Concerns regarding medicines are outlined above.  No orders of the defined types were placed in this encounter.  No orders of the defined types were placed in this encounter.   Patient Instructions  Medication Instructions:  No Changes In Medications at this time.  *If you need a refill on your cardiac medications before your next appointment, please call your pharmacy*  Follow-Up: At [facility], you and your health needs are our priority.  As part of our continuing mission to provide you with exceptional heart care, we have created designated Provider Care Teams.  These Care Teams include your primary Cardiologist (physician) and Advanced Practice Providers (APPs -  Physician Assistants and Nurse Practitioners) who all work together to provide you with the care you need, when you need it.  Your next appointment:   1 year(s)  The format for your next appointment:   In Person  Provider:   Eleonore Chiquito, MD       Time Spent with Patient: I have spent a total of 25 minutes with patient reviewing hospital notes, telemetry, EKGs, labs and examining the patient as well as establishing an assessment and plan that was discussed with the patient.  > 50% of time was spent in direct patient care.  Signed, Addison Naegeli. Audie Box, MD, [place]  [address], [place], [place] [address] [phone]  01/26/2021 7:25 PM

## 2021-01-26 ENCOUNTER — Ambulatory Visit (INDEPENDENT_AMBULATORY_CARE_PROVIDER_SITE_OTHER): Payer: Medicare HMO | Admitting: Cardiovascular Disease

## 2021-01-26 ENCOUNTER — Encounter: Payer: Self-pay | Admitting: Cardiovascular Disease

## 2021-01-26 ENCOUNTER — Other Ambulatory Visit: Payer: Self-pay

## 2021-01-26 ENCOUNTER — Ambulatory Visit: Payer: Medicare HMO | Admitting: Gastroenterology

## 2021-01-26 VITALS — BP 120/64 | HR 84 | Ht 62.0 in | Wt 146.0 lb

## 2021-01-26 DIAGNOSIS — I10 Essential (primary) hypertension: Secondary | ICD-10-CM

## 2021-01-26 DIAGNOSIS — E782 Mixed hyperlipidemia: Secondary | ICD-10-CM | POA: Diagnosis not present

## 2021-01-26 DIAGNOSIS — Z720 Tobacco use: Secondary | ICD-10-CM

## 2021-01-26 DIAGNOSIS — I739 Peripheral vascular disease, unspecified: Secondary | ICD-10-CM

## 2021-01-26 DIAGNOSIS — M79605 Pain in left leg: Secondary | ICD-10-CM

## 2021-01-26 NOTE — Patient Instructions (Signed)
Medication Instructions:  No Changes In Medications at this time.  *If you need a refill on your cardiac medications before your next appointment, please call your pharmacy*  Follow-Up: At [facility], you and your health needs are our priority.  As part of our continuing mission to provide you with exceptional heart care, we have created designated Provider Care Teams.  These Care Teams include your primary Cardiologist (physician) and Advanced Practice Providers (APPs -  Physician Assistants and Nurse Practitioners) who all work together to provide you with the care you need, when you need it.  Your next appointment:   1 year(s)  The format for your next appointment:   In Person  Provider:   Eleonore Chiquito, MD

## 2021-01-27 ENCOUNTER — Ambulatory Visit
Admission: RE | Admit: 2021-01-27 | Discharge: 2021-01-27 | Disposition: A | Discharge status: Home / Self Care | Payer: Medicare HMO | Source: Ambulatory Visit | Attending: Registered Nurse | Admitting: Registered Nurse

## 2021-01-27 DIAGNOSIS — E2839 Other primary ovarian failure: Secondary | ICD-10-CM

## 2021-02-01 ENCOUNTER — Other Ambulatory Visit (HOSPITAL_COMMUNITY): Payer: Self-pay | Admitting: General Practice

## 2021-02-01 DIAGNOSIS — R130 Aphagia: Secondary | ICD-10-CM

## 2021-02-01 DIAGNOSIS — K222 Esophageal obstruction: Secondary | ICD-10-CM

## 2021-02-03 ENCOUNTER — Other Ambulatory Visit (HOSPITAL_COMMUNITY): Payer: Self-pay

## 2021-02-03 DIAGNOSIS — R1310 Dysphagia, unspecified: Secondary | ICD-10-CM

## 2021-02-07 ENCOUNTER — Ambulatory Visit (HOSPITAL_COMMUNITY): Payer: Medicare HMO

## 2021-02-07 ENCOUNTER — Encounter (HOSPITAL_COMMUNITY): Payer: Self-pay

## 2021-02-08 ENCOUNTER — Ambulatory Visit (HOSPITAL_COMMUNITY): Payer: Medicare HMO

## 2021-02-08 ENCOUNTER — Ambulatory Visit (INDEPENDENT_AMBULATORY_CARE_PROVIDER_SITE_OTHER): Payer: Medicare HMO | Admitting: Physician Assistant

## 2021-02-08 ENCOUNTER — Other Ambulatory Visit: Payer: Self-pay

## 2021-02-08 ENCOUNTER — Encounter (HOSPITAL_COMMUNITY): Payer: Medicare HMO

## 2021-02-08 ENCOUNTER — Encounter: Payer: Self-pay | Admitting: Physician Assistant

## 2021-02-08 VITALS — BP 156/82 | HR 99 | Wt 148.0 lb

## 2021-02-08 DIAGNOSIS — K439 Ventral hernia without obstruction or gangrene: Secondary | ICD-10-CM | POA: Diagnosis not present

## 2021-02-08 DIAGNOSIS — Z86010 Personal history of colonic polyps: Secondary | ICD-10-CM | POA: Diagnosis not present

## 2021-02-08 NOTE — Progress Notes (Addendum)
Subjective:    Patient ID: Kara Bailey, female    DOB: [birth year], 65 y.o.   MRN: [id number]  HPI [name] is a pleasant 65 year old African-American female, established with Dr. Loletha Carrow who comes in today with complaint of an abdominal wall hernia which has been symptomatic. She says that she has been noticing this over the past several months and that symptoms have continued to be intermittent but have gradually worsened.  She says sometimes the hernia can be very uncomfortable.  She has not had any prolonged episodes or associated nausea or vomiting.  She says she sometimes finds it helpful to lay on her stomach to get relief. She is having fairly normal bowel movements using Dulcolax 1-2 every other day.  No melena or hematochezia. Patient last had colonoscopy in June 2017 per Dr. Gala Romney and was found to have a 9 mm polyp which was removed and found to be a tubular adenoma.  She is indicated for 5-year interval follow-up. She had EGD in September 2020 with complaints of dysphagia and was found to have a distal esophageal stricture which was balloon dilated from 13.5 to 15.5 mm, also a small sliding hiatal hernia mild pyloric stenosis and mild gastritis. Patient has history of hypertension, she was hospitalized in mid February with an acute CVA and was treated with 3 weeks of aspirin and Plavix and now just on aspirin.  She did have some dysphagia symptoms with the CVA and was evaluated by speech pathology with modified swallowing study showing some pharyngeal delay, week manipulation, some premature spillage to the piriform sinuses and one episode of silent aspiration.  She was placed on a dysphagia 3 diet and nectar thick liquids. She says she is doing fine currently with her swallowing, and is no longer requiring the thickened liquids.  She denies any dysphagia episodes or regurgitation. Patient also mentions that she has peripheral arterial disease and has seen a vascular surgeon and will  require a surgery for that. She has history of chronic hepatitis C treated and eradicated. Most recent echo December 2021 normal  Review of Systems Pertinent positive and negative review of systems were noted in the above HPI section.  All other review of systems was otherwise negative.  Outpatient Encounter Medications as of 02/08/2021  Medication Sig  . albuterol (VENTOLIN HFA) 108 (90 Base) MCG/ACT inhaler Inhale 2 puffs into the lungs every 6 (six) hours as needed for shortness of breath or wheezing.  Marland Kitchen aspirin EC 81 MG EC tablet Take 1 tablet (81 mg total) by mouth daily. Swallow whole.  Marland Kitchen atorvastatin (LIPITOR) 80 MG tablet Take 1 tablet (80 mg total) by mouth daily.  . busPIRone (BUSPAR) 15 MG tablet Take 1 tablet (15 mg total) by mouth 2 (two) times daily.  . cyclobenzaprine (FLEXERIL) 10 MG tablet Take 10 mg by mouth daily as needed for muscle spasms (Back pain).  Marland Kitchen diltiazem (CARDIZEM CD) 180 MG 24 hr capsule TAKE 1 CAPSULE BY MOUTH DAILY. (Patient taking differently: Take by mouth daily.)  . escitalopram (LEXAPRO) 20 MG tablet Take 20 mg by mouth daily.  . famotidine (PEPCID) 40 MG tablet Take 40 mg by mouth daily.  . food thickener (RESOURCE THICKENUP CLEAR) POWD Thicken liquids to nectar consistency  . hydroxypropyl methylcellulose / hypromellose (ISOPTO TEARS / GONIOVISC) 2.5 % ophthalmic solution Place 1 drop into both eyes 2 (two) times daily.  . Maltodextrin-Xanthan Gum (RESOURCE THICKENUP CLEAR) POWD Take 120 g by mouth as needed.  . mirtazapine (REMERON)  30 MG tablet Take 0.5 tablets (15 mg total) by mouth at bedtime. (Patient taking differently: Take 30 mg by mouth at bedtime.)  . Multiple Vitamin (MULTIVITAMIN WITH MINERALS) TABS tablet Take 1 tablet by mouth daily.  . nicotine (NICODERM CQ - DOSED IN MG/24 HOURS) 21 mg/24hr patch Place 1 patch (21 mg total) onto the skin daily.  . pantoprazole (PROTONIX) 40 MG tablet Take 1 tablet (40 mg total) by mouth daily.  Marland Kitchen thiamine  100 MG tablet Take 1 tablet (100 mg total) by mouth daily.  . [DISCONTINUED] clopidogrel (PLAVIX) 75 MG tablet Take 1 tablet (75 mg total) by mouth daily.   Facility-Administered Encounter Medications as of 02/08/2021  Medication  . sodium chloride flush (NS) 0.9 % injection 3 mL   No Known Allergies Patient Active Problem List   Diagnosis Date Noted  . Acute CVA (cerebrovascular accident) ([place]) 12/29/2020  . Peripheral arterial disease ([place]) 12/20/2020  . History of esophageal stricture 07/08/2020  . Insomnia 12/02/2017  . Chronic cough 08/14/2017  . Poor appetite 08/29/2016  . Peptic stricture of esophagus   . History of colonic polyps   . Hiatal hernia 03/16/2016  . Odynophagia 03/16/2016  . Dysphagia 03/16/2016  . Alcohol abuse 10/26/2015  . Essential hypertension 09/26/2015  . PAC (premature atrial contraction) 10/11/2014  . PVC (premature ventricular contraction) 10/11/2014  . Back muscle spasm 06/15/2014  . Generalized anxiety disorder 06/15/2014  . Smoking 06/15/2014  . Chronic hepatitis C without hepatic coma ([place]) 05/28/2013   Social History   Socioeconomic History  . Marital status: Divorced    Spouse name: Not on file  . Number of children: 2  . Years of education: Not on file  . Highest education level: Not on file  Occupational History  . Occupation: retired  Tobacco Use  . Smoking status: Current Every Day Smoker    Packs/day: 0.50    Years: 40.00    Pack years: 20.00    Types: Cigarettes    Start date: 11/12/1968  . Smokeless tobacco: Never Used  . Tobacco comment: cutting back, wearing patches  Vaping Use  . Vaping Use: Never used  Substance and Sexual Activity  . Alcohol use: Not Currently    Alcohol/week: 0.0 standard drinks    Comment: beer on weekends (6-pack)  . Drug use: No  . Sexual activity: Not Currently  Other Topics Concern  . Not on file  Social History Narrative  . Not on file   Social Determinants of Health   Financial  Resource Strain: Not on file  Food Insecurity: Not on file  Transportation Needs: Not on file  Physical Activity: Not on file  Stress: Not on file  Social Connections: Not on file  Intimate Partner Violence: Not on file    Ms. Mcelroy's family history includes Alzheimer's disease in her father; CVA in her father; Heart attack in her mother; Heart disease in her mother; Heart failure in her mother; Hyperlipidemia in her mother; Hypertension in her mother.      Objective:    Vitals:   02/08/21 0943  BP: (!) 156/82  Pulse: 99    Physical Exam Well-developed well-nourished AA female in no acute distress.  Height, Weight, BMI 27.07  HEENT; nontraumatic normocephalic, EOMI, PE R LA, sclera anicteric. Oropharynx; not examined today Neck; supple, no JVD Cardiovascular; regular rate and rhythm with S1-S2, no murmur rub or gallop Pulmonary; Clear bilaterally Abdomen; soft,  nondistended, there is a supraumbilical ventral hernia about the size  of a golf ball, mild tenderness and easily reducible no palpable mass or hepatosplenomegaly, bowel sounds are active Rectal; not done today Skin; benign exam, no jaundice rash or appreciable lesions Extremities; no clubbing cyanosis or edema skin warm and dry Neuro/Psych; alert and oriented x4, grossly nonfocal mood and affect appropriate      Assessment & Plan:   #5 65 year old African-American female with symptomatic supraumbilical ventral hernia, reducible on exam.  Patient has been symptomatic over the past several months with gradual progression of symptoms.  #2 history of adenomatous colon polyps-last colonoscopy June 2017 with removal of a 9 mm tubular adenoma, indicated for 5-year interval follow-up 3.  History of distal esophageal stricture status post EGD with dilation September 2021 no current symptoms also noted to have mild pyloric stenosis at that time and mild gastritis 4.  History of hepatitis C treated and eradicated 5.  Recent  CVA mid February 2022 3-week course of aspirin and Plavix and now on aspirin.  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  Initially required thickened liquids but symptoms have improved #6 peripheral arterial disease 7.  Hypertension  Plan; patient will be referred to [facility] for surgical evaluation and repair of the ventral hernia. She will be due for follow-up colonoscopy summer 2022  I have asked her to return in June, and can get her scheduled for colonoscopy at that time.  We would like to wait 3 months out post CVA prior to any elective procedure. Continue Protonix 40 mg p.o. every morning and famotidine 40 mg AC dinner  Kainen Struckman S Teran Daughenbaugh PA-C 02/08/2021   Cc: Andree Moro, DO

## 2021-02-08 NOTE — Patient Instructions (Addendum)
If you are age 67 or older, your body mass index should be between 23-30. Your Body mass index is 27.07 kg/m. If this is out of the aforementioned range listed, please consider follow up with your Primary Care Provider.  If you are age 38 or younger, your body mass index should be between 19-25. Your Body mass index is 27.07 kg/m. If this is out of the aformentioned range listed, please consider follow up with your Primary Care Provider.   We will be sending a referral to [facility]. They are located at: [address], [place], [place] [address] You can call them at: [phone]  You will be due for your next you next Colonoscopy in June.  Thank you for entrusting me with your care and choosing [facility].  Amy Esterwood, PA-C

## 2021-02-10 ENCOUNTER — Ambulatory Visit (HOSPITAL_COMMUNITY): Payer: Medicare HMO

## 2021-02-10 ENCOUNTER — Encounter (HOSPITAL_COMMUNITY): Payer: Medicare HMO

## 2021-02-10 ENCOUNTER — Encounter (HOSPITAL_COMMUNITY): Payer: Self-pay

## 2021-02-10 NOTE — Progress Notes (Signed)
____________________________________________________________  Attending physician addendum:  Thank you for sending this case to me. I have reviewed the entire note and agree with the plan.  Agree we must wait at least 3 months from CVA before routine colonoscopy. (I believe that and the June 2017 indicated in your Plan are typos - please correct)  Wilfrid Lund, MD  ____________________________________________________________

## 2021-02-15 ENCOUNTER — Ambulatory Visit (HOSPITAL_COMMUNITY)
Admission: RE | Admit: 2021-02-15 | Discharge: 2021-02-15 | Disposition: A | Discharge status: Home / Self Care | Payer: Medicare HMO | Source: Ambulatory Visit | Attending: General Practice | Admitting: General Practice

## 2021-02-15 ENCOUNTER — Other Ambulatory Visit: Payer: Self-pay

## 2021-02-15 ENCOUNTER — Other Ambulatory Visit (HOSPITAL_COMMUNITY): Payer: Self-pay | Admitting: General Practice

## 2021-02-15 DIAGNOSIS — K222 Esophageal obstruction: Secondary | ICD-10-CM | POA: Diagnosis not present

## 2021-02-15 DIAGNOSIS — R1310 Dysphagia, unspecified: Secondary | ICD-10-CM | POA: Insufficient documentation

## 2021-02-15 DIAGNOSIS — R130 Aphagia: Secondary | ICD-10-CM | POA: Insufficient documentation

## 2021-02-15 NOTE — Progress Notes (Signed)
Modified Barium Swallow Progress Note  Patient Details  Name: Kara Bailey MRN: [id number] Date of Birth: [birth year]  Today's Date: 02/15/2021  Modified Barium Swallow completed.  Full report located under Chart Review in the Imaging Section.  Brief recommendations include the following:  Clinical Impression  Pt shows improvement since previous MBS, particularly in the area of airway protection. She still has primarily oral deficits, including lingual pumping, decreased bolus cohesion, and oral transit that is quite delayed. Mild-moderate lingual residue is present, but she manages this well with Mod I. As she is working on propelling thin liquids posteriorly, liquids also begin to pool in her pyriform sinuses, where they sit with a delay that is more pronounced at times depending on the efficiency of her oral clearance. As a result of this, she has some penetration that is trace, very shallow, and fully clears upon completion of the swallow (PAS 2). Recommend continuing with regular solids and thin liquids using extra time and aspiration precautions.   Swallow Evaluation Recommendations       SLP Diet Recommendations: Regular solids;Thin liquid   Liquid Administration via: Cup;Straw   Medication Administration: Crushed with puree   Supervision: Patient able to self feed   Compensations: Slow rate;Small sips/bites   Postural Changes: Seated upright at 90 degrees;Remain semi-upright after after feeds/meals (Comment)   Oral Care Recommendations: Oral care BID        Osie Bond., M.A. [place] Pager [phone] Office [phone]  02/15/2021,12:25 PM

## 2021-03-08 ENCOUNTER — Ambulatory Visit: Payer: Self-pay | Admitting: Surgery

## 2021-03-17 ENCOUNTER — Encounter (HOSPITAL_COMMUNITY): Payer: Self-pay | Admitting: Surgery

## 2021-03-17 ENCOUNTER — Encounter (HOSPITAL_COMMUNITY): Payer: Self-pay | Admitting: Physician Assistant

## 2021-03-17 ENCOUNTER — Other Ambulatory Visit (HOSPITAL_COMMUNITY)
Admission: RE | Admit: 2021-03-17 | Discharge: 2021-03-17 | Disposition: A | Discharge status: Home / Self Care | Payer: Medicare HMO | Source: Ambulatory Visit | Attending: Surgery | Admitting: Surgery

## 2021-03-17 ENCOUNTER — Other Ambulatory Visit: Payer: Self-pay

## 2021-03-17 DIAGNOSIS — Z20822 Contact with and (suspected) exposure to covid-19: Secondary | ICD-10-CM | POA: Diagnosis not present

## 2021-03-17 DIAGNOSIS — Z01812 Encounter for preprocedural laboratory examination: Secondary | ICD-10-CM | POA: Insufficient documentation

## 2021-03-17 NOTE — Progress Notes (Signed)
Anesthesia Chart Review   Case: [id number] Date/Time: 03/20/21 1345   Procedure: OPEN HERNIA REPAIR VENTRAL WITH MESH (N/A )   Anesthesia type: General   Pre-op diagnosis: VENTRAL HERNIA   Location: [facility] ROOM 05 / [facility]   Surgeons: Armandina Gemma, MD      DISCUSSION:65 y.o. current every day smoker with h/o GERD, HTN, cirrhosis, PAD, Stroke, ventral hernia scheduled for above procedure 03/20/2021 with Dr. Armandina Gemma.   Acute CVA 12/29/2020 related to elevated bp and hyperlipidemia. Pt experiencing residual dysphagia.  Placed on Plavix (for three weeks) and aspirin. Following with speech therapy.  Last visit 02/15/2021. Per note she has progressed to regular solids at this time. Showing improvement in airway protection.   Last seen by cardiology 01/26/2021. Per OV note bp well controlled, denies sx of angina or chest pain. Pt with significant PAD with plans for peripheral evaluation with angiography which will be performed 3 months out from stroke.  Discussed with Dr. Gala Lewandowsky triage nurse, neurology clearance needed due to recent stroke.   VS: Ht 5\' 6"  (1.676 m)   Wt 68.5 kg   BMI 24.37 kg/m   PROVIDERS: Andree Moro, DO is PCP   O'Neal, Cassie Freer, MD is Cardiologist  LABS: Labs reviewed: Acceptable for surgery. and labs DOS (all labs ordered are listed, but only abnormal results are displayed)  Labs Reviewed - No data to display   IMAGES:   EKG: 12/30/2020 Rate 88 bpm  Sinus rhythm  biatrial enlargement  abnrm T, consider ischemia, anterolateral leads   CV: Echo 12/30/2020 1. Left ventricular ejection fraction, by estimation, is >75%. The left  ventricle has hyperdynamic function. The left ventricle has no regional  wall motion abnormalities. There is moderate left ventricular hypertrophy.  Left ventricular diastolic  parameters are consistent with Grade I diastolic dysfunction (impaired  relaxation).  2. Right ventricular systolic function is normal. The right  ventricular  size is normal.  3. The mitral valve is normal in structure. No evidence of mitral valve  regurgitation. No evidence of mitral stenosis.  4. The aortic valve is normal in structure. Aortic valve regurgitation is  not visualized. No aortic stenosis is present.  5. The inferior vena cava is normal in size with greater than 50%  respiratory variability, suggesting right atrial pressure of 3 mmHg.  6. Agitated saline contrast bubble study was negative, with no evidence  of any interatrial shunt.   Past Medical History:  Diagnosis Date  . Anxiety   . Chest pain 08/2014   unspecified  . Cirrhosis ([place])   . Depression   . Dizziness 08/2014  . Dyspnea 08/2014  . Emphysema lung ([place])   . GERD (gastroesophageal reflux disease)   . Heart murmur   . Hepatitis C   . Hiatal hernia   . Hypertension   . PAC (premature atrial contraction) 10/11/2014  . PAD (peripheral artery disease) ([place])   . Palpitations   . PVC (premature ventricular contraction) 10/11/2014  . 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 Stroke [facility])    just slurred speech  . Tachycardia 08/2014  . Ventral hernia   . Weakness 08/2014    Past Surgical History:  Procedure Laterality Date  . APPENDECTOMY    . CATARACT EXTRACTION, BILATERAL    . COLONOSCOPY    . COLONOSCOPY WITH PROPOFOL N/A 04/30/2016   Procedure: COLONOSCOPY WITH PROPOFOL;  Surgeon: Daneil Dolin, MD;  [facility];  Service: Endoscopy;  Laterality: N/A;  1230  .  ESOPHAGOGASTRODUODENOSCOPY     approximately 2010  . ESOPHAGOGASTRODUODENOSCOPY (EGD) WITH PROPOFOL N/A 04/30/2016   Procedure: ESOPHAGOGASTRODUODENOSCOPY (EGD) WITH PROPOFOL;  Surgeon: Daneil Dolin, MD;  [facility];  Service: Endoscopy;  Laterality: N/A;  . LEFT HEART CATHETERIZATION WITH CORONARY ANGIOGRAM N/A 10/29/2014   07-14-20- pt denies this Procedure: LEFT HEART CATHETERIZATION WITH CORONARY ANGIOGRAM;  Surgeon: Burnell Blanks, MD;  Location: [facility] CATH LAB;  Service: Cardiovascular;  Laterality: N/A;  . Venia Minks DILATION N/A 04/30/2016   Procedure: Venia Minks DILATION;  Surgeon: Daneil Dolin, MD;  [facility];  Service: Endoscopy;  Laterality: N/A;  . POLYPECTOMY  04/30/2016   Procedure: POLYPECTOMY;  Surgeon: Daneil Dolin, MD;  [facility];  Service: Endoscopy;;  Sigmoid colon polyp removed via hot snare  . UPPER GASTROINTESTINAL ENDOSCOPY      MEDICATIONS: . sodium chloride flush (NS) 0.9 % injection 3 mL   . albuterol (VENTOLIN HFA) 108 (90 Base) MCG/ACT inhaler  . aspirin EC 81 MG EC tablet  . atorvastatin (LIPITOR) 80 MG tablet  . cyclobenzaprine (FLEXERIL) 10 MG tablet  . diltiazem (TIAZAC) 180 MG 24 hr capsule  . escitalopram (LEXAPRO) 20 MG tablet  . famotidine (PEPCID) 40 MG tablet  . hydroxypropyl methylcellulose / hypromellose (ISOPTO TEARS / GONIOVISC) 2.5 % ophthalmic solution  . mirtazapine (REMERON) 30 MG tablet  . nicotine (NICODERM CQ - DOSED IN MG/24 HOURS) 21 mg/24hr patch  . pantoprazole (PROTONIX) 40 MG tablet  . thiamine 100 MG tablet  . busPIRone (BUSPAR) 15 MG tablet  . food thickener ([place]) POWD  . Maltodextrin-Xanthan Gum ([place]) POWD  . Multiple Vitamin (MULTIVITAMIN WITH MINERALS) TABS tablet     Konrad Felix, PA-C [facility] [phone]

## 2021-03-17 NOTE — Progress Notes (Signed)
COVID Vaccine Completed: Yes Date COVID Vaccine completed:x2 Has received booster: Yes COVID vaccine manufacturer:  Moderna    Date of COVID positive in last 90 days: No  PCP - [facility] Cardiologist - Dr. Lake Bells O'Neal last office visit 01/26/2021 in epic   Chest x-ray - greater than 1 year EKG - 12/30/2020 in epic Stress Test - greater than 2 years ECHO - 12/30/2020 in epic Cardiac Cath - N/A Pacemaker/ICD device last checked: N/A  Sleep Study - N/A CPAP - N/A  Fasting Blood Sugar - N/A Checks Blood Sugar ___N/A__ times a day  Blood Thinner Instructions: N/A Aspirin Instructions: N/A Last Dose:N/A  Activity level:  activities of daily living without stopping and without symptoms, unable to go up a flight of stairs    Anesthesia review: Cirrhosis, Hepatitis C, PAD, HTN, Emphysema, History of Stroke 12/29/2020  Patient denies shortness of breath, fever, cough and chest pain at PAT appointment   Patient verbalized understanding of instructions that were given to them at the PAT appointment. Patient was also instructed that they will need to review over the PAT instructions again at home before surgery.

## 2021-03-18 LAB — SARS CORONAVIRUS 2 (TAT 6-24 HRS): SARS Coronavirus 2: NEGATIVE

## 2021-03-20 ENCOUNTER — Ambulatory Visit (HOSPITAL_COMMUNITY): Admission: RE | Admit: 2021-03-20 | Payer: Medicare HMO | Source: Home / Self Care | Admitting: Surgery

## 2021-03-20 HISTORY — DX: Cerebral infarction, unspecified: I63.9

## 2021-03-20 HISTORY — DX: Cardiac murmur, unspecified: R01.1

## 2021-03-20 SURGERY — REPAIR, HERNIA, VENTRAL
Anesthesia: General

## 2021-04-11 ENCOUNTER — Ambulatory Visit (INDEPENDENT_AMBULATORY_CARE_PROVIDER_SITE_OTHER): Payer: Medicare HMO | Admitting: Adult Health

## 2021-04-11 ENCOUNTER — Encounter: Payer: Self-pay | Admitting: Adult Health

## 2021-04-11 VITALS — BP 145/88 | HR 95 | Ht 66.0 in | Wt 151.0 lb

## 2021-04-11 DIAGNOSIS — I69359 Hemiplegia and hemiparesis following cerebral infarction affecting unspecified side: Secondary | ICD-10-CM | POA: Diagnosis not present

## 2021-04-11 DIAGNOSIS — I10 Essential (primary) hypertension: Secondary | ICD-10-CM

## 2021-04-11 DIAGNOSIS — I639 Cerebral infarction, unspecified: Secondary | ICD-10-CM | POA: Diagnosis not present

## 2021-04-11 DIAGNOSIS — I6381 Other cerebral infarction due to occlusion or stenosis of small artery: Secondary | ICD-10-CM

## 2021-04-11 DIAGNOSIS — I69328 Other speech and language deficits following cerebral infarction: Secondary | ICD-10-CM

## 2021-04-11 DIAGNOSIS — F1010 Alcohol abuse, uncomplicated: Secondary | ICD-10-CM

## 2021-04-11 DIAGNOSIS — E785 Hyperlipidemia, unspecified: Secondary | ICD-10-CM

## 2021-04-11 DIAGNOSIS — Z720 Tobacco use: Secondary | ICD-10-CM

## 2021-04-11 NOTE — Progress Notes (Signed)
[facility] [address] [place]. [place] [address] [phone]       HOSPITAL FOLLOW UP NOTE  Ms. Kara Bailey Date of Birth:  [birth year] Medical Record Number:  [id number]   Reason for Referral:  hospital stroke follow up    SUBJECTIVE:   CHIEF COMPLAINT:  Chief Complaint  Patient presents with  . Follow-up    Rm 14 alone Pt still has trouble with speech, R sided Bailey/numbness.     HPI:   Kara Westrich Harrisis a 65 y.o.femalewith a history of Bailey, Kara Bailey, Kara Bailey, Kara Bailey, Kara Bailey,Kara Bailey, Kara Bailey,  Kara Bailey, Kara Bailey, Kara Bailey.  Personally reviewed hospitalization pertinent progress notes, lab work and imaging summary provided. In [place] ED, MRI showed left thalamic infarct and transferred to [facility] for further evaluation and care.  Evaluated by Dr. Leonie Man and infarct likely secondary to small vessel disease.  CTA head/neck unremarkable.  2D echo EF 75%.  LDL 58.  A1c 5.5.  Recommended DAPT for 3 weeks and aspirin alone.  Kara Bailey stable.  Increase atorvastatin dose from 10 mg to 80 mg daily.  Tobacco cessation counseling provided.  Kara use disorder on CIWA protocol.  MR brain showed areas of old hemorrhagic infarctions in the right superior cerebellum, right basal ganglia and radiating white matter tracts.  Residual deficits of moderate dysarthria, expressive aphasia, dysphagia (Dys3 NTL diet), mild right facial Bailey and right hemiparesis.  Evaluated by therapies Kara recommended HH PT/OT/SLP and discharged home in stable condition on 12/31/2020.  Today, 04/11/2021, Kara Bailey is being seen for hospital follow-up unaccompanied  Reports residual speech difficulties and right-sided Bailey with numbness which has been slowly improving. Mild dysphagia with improvement currently on  regular diet without Kara.  Ambulates without assistive device and denies any Kara falls. Since completed home health therapies - plans to start with neuro rehab Friday  She does live with her significant other and able to complete ADLs and majority of IADLs with assistance needed at times. Denies new stroke/TIA symptoms  Completed 3 weeks DAPT remains on aspirin alone as well as atorvastatin 80 mg daily without associated side effects Blood pressure today 145/88 - occassionally monitors at home which has been stable  Continued tobacco use but has been able to cut back with use of patches Continue Kara use approximately a 12 pack/week   No further concerns at this time      ROS:   14 system review of systems performed and negative with exception of those listed in HPI  PMH:  Past Medical History:  Diagnosis Date  . Anxiety   . Chest pain 08/2014   unspecified  . Cirrhosis ([place])   . Depression   . Dizziness 08/2014  . Dyspnea 08/2014  . Emphysema lung ([place])   . GERD (gastroesophageal reflux disease)   . Heart murmur   . Hepatitis C   . Hiatal hernia   . Hypertension   . PAC (premature atrial contraction) 10/11/2014  . Kara Bailey (peripheral artery disease) ([place])   . Palpitations   . PVC (premature ventricular contraction) 10/11/2014  . 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 Stroke [facility])    just slurred speech  . Tachycardia 08/2014  . Ventral hernia   . Bailey 08/2014    PSH:  Past Surgical History:  Procedure Laterality Date  . APPENDECTOMY    .  CATARACT EXTRACTION, BILATERAL    . COLONOSCOPY    . COLONOSCOPY WITH PROPOFOL N/A 04/30/2016   Procedure: COLONOSCOPY WITH PROPOFOL;  Surgeon: Daneil Dolin, MD;  [facility];  Service: Endoscopy;  Laterality: N/A;  1230  . ESOPHAGOGASTRODUODENOSCOPY     approximately 2010  . ESOPHAGOGASTRODUODENOSCOPY (EGD) WITH PROPOFOL N/A 04/30/2016   Procedure: ESOPHAGOGASTRODUODENOSCOPY (EGD) WITH  PROPOFOL;  Surgeon: Daneil Dolin, MD;  Location: [facility];  Service: Endoscopy;  Laterality: N/A;  . LEFT HEART CATHETERIZATION WITH CORONARY ANGIOGRAM N/A 10/29/2014   07-14-20- pt denies this Procedure: LEFT HEART CATHETERIZATION WITH CORONARY ANGIOGRAM;  Surgeon: Burnell Blanks, MD;  Location: [facility] CATH LAB;  Service: Cardiovascular;  Laterality: N/A;  . Venia Minks DILATION N/A 04/30/2016   Procedure: Venia Minks DILATION;  Surgeon: Daneil Dolin, MD;  Location: [facility];  Service: Endoscopy;  Laterality: N/A;  . POLYPECTOMY  04/30/2016   Procedure: POLYPECTOMY;  Surgeon: Daneil Dolin, MD;  Location: [facility];  Service: Endoscopy;;  Sigmoid colon polyp removed via hot snare  . UPPER GASTROINTESTINAL ENDOSCOPY      Social History:  Social History   Socioeconomic History  . Marital status: Divorced    Spouse name: Not on file  . Number of children: 2  . Years of education: Not on file  . Highest education level: Not on file  Occupational History  . Occupation: retired  Tobacco Use  . Bailey status: Current Every Day Smoker    Packs/day: 0.50    Years: 40.00    Pack years: 20.00    Types: Cigarettes    Start date: 11/12/1968  . Smokeless tobacco: Never Used  . Tobacco comment: cutting back, wearing patches  Vaping Use  . Vaping Use: Never used  Substance and Sexual Activity  . Alcohol use: Yes    Alcohol/week: 0.0 standard drinks    Comment: beer on weekends (6-pack)  . Drug use: No  . Sexual activity: Not Currently  Other Topics Concern  . Not on file  Social History Narrative  . Not on file   Social Determinants of Health   Financial Resource Strain: Not on file  Food Insecurity: Not on file  Transportation Needs: Not on file  Physical Activity: Not on file  Stress: Not on file  Social Connections: Not on file  Intimate Partner Violence: Not on file    Family History:  Family History  Problem Relation Age of Onset  . Heart failure Mother   .  Heart attack Mother   . Heart disease Mother   . Hypertension Mother   . Hyperlipidemia Mother   . CVA Father   . Alzheimer's disease Father   . Cancer Neg Hx   . Colon cancer Neg Hx   . Kara cancer Neg Hx   . Rectal cancer Neg Hx   . Stomach cancer Neg Hx     Medications:   Current Outpatient Medications on File Prior to Visit  Medication Sig Dispense Refill  . albuterol (VENTOLIN HFA) 108 (90 Base) MCG/ACT inhaler Inhale 2 puffs into the lungs every 6 (six) hours as needed for shortness of breath or wheezing.    Marland Kitchen aspirin EC 81 MG EC tablet Take 1 tablet (81 mg total) by mouth daily. Swallow whole. (Patient taking differently: Take 81 mg by mouth in the morning. Swallow whole.) 30 tablet 11  . atorvastatin (LIPITOR) 80 MG tablet Take 1 tablet (80 mg total) by mouth daily. (Patient taking differently:  Take 80 mg by mouth at bedtime.) 30 tablet 0  . busPIRone (BUSPAR) 15 MG tablet Take 1 tablet (15 mg total) by mouth 2 (two) times daily. 60 tablet 5  . cyclobenzaprine (FLEXERIL) 10 MG tablet Take 10 mg by mouth daily as needed for muscle spasms (Back pain).    Marland Kitchen diltiazem (TIAZAC) 180 MG 24 hr capsule Take 180 mg by mouth in the morning.    . escitalopram (LEXAPRO) 20 MG tablet Take 20 mg by mouth in the morning.    . famotidine (PEPCID) 40 MG tablet Take 40 mg by mouth at bedtime.    . food thickener (RESOURCE THICKENUP CLEAR) POWD Thicken liquids to nectar consistency 1 Can 1  . hydroxypropyl methylcellulose / hypromellose (ISOPTO TEARS / GONIOVISC) 2.5 % ophthalmic solution Place 1 drop into both eyes 2 (two) times daily as needed for dry eyes.    . mirtazapine (REMERON) 30 MG tablet Take 0.5 tablets (15 mg total) by mouth at bedtime. (Patient taking differently: Take 30 mg by mouth at bedtime.) 30 tablet 5  . Multiple Vitamin (MULTIVITAMIN WITH MINERALS) TABS tablet Take 1 tablet by mouth daily. 30 tablet 1  . nicotine (NICODERM CQ - DOSED IN MG/24 HOURS) 21 mg/24hr patch Place  1 patch (21 mg total) onto the skin daily. 28 patch 0  . pantoprazole (PROTONIX) 40 MG tablet Take 1 tablet (40 mg total) by mouth daily. (Patient taking differently: Take 40 mg by mouth in the morning.) 30 tablet 5  . thiamine 100 MG tablet Take 1 tablet (100 mg total) by mouth daily. (Patient taking differently: Take 100 mg by mouth in the morning.) 30 tablet 0   Current Facility-Administered Medications on File Prior to Visit  Medication Dose Route Frequency Provider Last Rate Last Admin  . sodium chloride flush (NS) 0.9 % injection 3 mL  3 mL Intravenous Q12H Lorretta Harp, MD        Allergies:  No Known Allergies    OBJECTIVE:  Physical Exam  Vitals:   04/11/21 1053  BP: (!) 145/88  Pulse: 95  Weight: 151 lb (68.5 kg)  Height: 5\' 6"  (1.676 m)   Body mass index is 24.37 kg/m. No exam data present  General: well developed, well nourished,  pleasant elderly African-American female, seated, in no evident distress Head: head normocephalic and atraumatic.   Neck: supple with no carotid or supraclavicular bruits Cardiovascular: regular rate and rhythm, no murmurs Musculoskeletal: no deformity Skin:  no rash/petichiae Vascular:  Normal pulses all extremities   Neurologic Exam Mental Status: Awake and fully alert.   Mild to moderate dysarthria and mild aphasia.  Oriented to place and time. Kara memory impaired and remote memory intact. Attention span, concentration and fund of knowledge appropriate during visit. Mood and affect appropriate.  Cranial Nerves: Fundoscopic exam reveals sharp disc margins. Pupils equal, briskly reactive to light. Extraocular movements full without nystagmus. Visual fields full to confrontation. Hearing intact. Facial sensation intact.  Mild facial asymmetry.  Tongue, and palate moves normally and symmetrically.  Motor: Normal strength, bulk and tone left upper and lower extremity.  Possible mild right-sided Bailey greater distally but Kara  fully evaluating due to giveaway Bailey Sensory.:  Decreased light touch sensation right upper extremity otherwise BLE intact to light touch and intact to pinprick , position and vibratory sensation throughout all extremities.  Subjective right upper and lower extremity numbness Coordination: Rapid alternating movements slowed in both hands. Finger-to-nose and heel-to-shin performed accurately bilaterally. Gait  and Station: Arises from chair without Kara. Stance is normal. Gait demonstrates normal stride length and mild imbalance without use of assistive device.  Unable to tandem walk and heel toe.  Reflexes: 1+ and symmetric. Toes downgoing.     NIHSS  4 Modified Rankin  2-3      ASSESSMENT: MARSHIA TROPEA is a 65 y.o. year old female with left thalamic ischemic stroke likely secondary to small vessel disease on 12/29/2020. Vascular risk factors include Kara Bailey, HLD, Kara Bailey, Kara Bailey, tobacco use, and advanced age.      PLAN:  1. Left thalamic stroke:  a. Residual deficit: mild right sided Bailey with sensory impairment, dysarthria, aphasia, dysphagia and short-term memory complaints. Plans to start neuro rehab therapies Friday -discussion regarding typical recovery time post stroke and potential for further recovery.  b. Continue aspirin 81 mg daily  and atorvastatin 80 mg daily for secondary stroke prevention.   c. Discussed secondary stroke prevention measures and importance of close PCP follow up for aggressive stroke risk factor management - she has establish care with new PCP at [facility] but unable to provide name of provider -she was advised to update this on MyChart once able 2. Kara Bailey: BP goal <130/90.  Stable on current regimen per PCP 3. HLD: LDL goal <70. Kara LDL 58 -currently on atorvastatin 80 mg daily.  4. Kara Bailey: Discussed ongoing use of aspirin and statin and routine follow-up with cardiology Dr. Gwenlyn Found and Dr. Audie Box - plans to pursue peripheral angiography  which was previously scheduled 2/21 but placed on hold in setting of her stroke 5. Tobacco and Kara use: Discussed importance of complete cessation as continued use greatly increases risk of recurrent stroke    Follow up in 4 months or call earlier if needed   CC:  [facility] provider: Dr. Leonie Man   I spent 56 minutes of face-to-face and non-face-to-face time with patient.  This included previsit chart review including extensive review of Kara hospitalization pertinent progress notes, lab work and imaging, study review, electronic health record documentation, and discussion and patient education regarding Kara stroke including etiology, residual deficits and typical recovery time, importance of managing stroke risk factors and compliance with all prescribed medications, Kara and tobacco cessation and answered all other questions to patient satisfaction  Frann Rider, AGNP-BC  [facility] [address] [place], [place] [address]  Phone [phone] Fax [phone] Note: This document was prepared with digital dictation and possible smart phrase technology. Any transcriptional errors that result from this process are unintentional.

## 2021-04-11 NOTE — Patient Instructions (Addendum)
Start working with neuro rehab for hopeful further recovery   Continue aspirin 81 mg daily  and atorvastatin for secondary stroke prevention  Continue to follow up with PCP regarding cholesterol and blood pressure management  Maintain strict control of hypertension with blood pressure goal below 130/90 and cholesterol with LDL cholesterol (bad cholesterol) goal below 70 mg/dL.   Recommend complete tobacco and alcohol use as continued use greatly increases your risk of additional strokes     Followup in the future with me in 4 months or call earlier if needed     Thank you for coming to see [place] at [facility]. I hope we have been able to provide you high quality care today.  You may receive a patient satisfaction survey over the next few weeks. We would appreciate your feedback and comments so that we may continue to improve ourselves and the health of our patients.     Stroke Prevention Some medical conditions and lifestyle choices can lead to a higher risk for a stroke. You can help to prevent a stroke by eating healthy foods and exercising. It also helps to not smoke and to manage any health problems you may have. How can this condition affect me? A stroke is an emergency. It should be treated right away. A stroke can lead to brain damage or threaten your life. There is a better chance of surviving and getting better after a stroke if you get medical help right away. What can increase my risk? The following medical conditions may increase your risk of a stroke:  Diseases of the heart and blood vessels (cardiovascular disease).  High blood pressure (hypertension).  Diabetes.  High cholesterol.  Sickle cell disease.  Problems with blood clotting.  Being very overweight.  Sleeping problems (obstructivesleep apnea). Other risk factors include:  Being older than age 83.  A history of blood clots, stroke, or mini-stroke (TIA).  Race, ethnic  background, or a family history of stroke.  Smoking or using tobacco products.  Taking birth control pills, especially if you smoke.  Heavy alcohol and drug use.  Not being active. What actions can I take to prevent this? Manage your health conditions  High cholesterol. ? Eat a healthy diet. If this is not enough to manage your cholesterol, you may need to take medicines. ? Take medicines as told by your doctor.  High blood pressure. ? Try to keep your blood pressure below 130/80. ? If your blood pressure cannot be managed through a healthy diet and regular exercise, you may need to take medicines. ? Take medicines as told by your doctor. ? Ask your doctor if you should check your blood pressure at home. ? Have your blood pressure checked every year.  Diabetes. ? Eat a healthy diet and get regular exercise. If your blood sugar (glucose) cannot be managed through diet and exercise, you may need to take medicines. ? Take medicines as told by your doctor.  Talk to your doctor about getting checked for sleeping problems. Signs of a problem can include: ? Snoring a lot. ? Feeling very tired.  Make sure that you manage any other conditions you have. Nutrition  Follow instructions from your doctor about what to eat or drink. You may be told to: ? Eat and drink fewer calories each day. ? Limit how much salt (sodium) you use to 1,500 milligrams (mg) each day. ? Use only healthy fats for cooking, such as olive oil, canola oil, and sunflower oil. ?  Eat healthy foods. To do this:  Choose foods that are high in fiber. These include whole grains, and fresh fruits and vegetables.  Eat at least 5 servings of fruits and vegetables a day. Try to fill one-half of your plate with fruits and vegetables at each meal.  Choose low-fat (lean) proteins. These include low-fat cuts of meat, chicken without skin, fish, tofu, beans, and nuts.  Eat low-fat dairy products. ? Avoid foods that:  Are  high in salt.  Have saturated fat.  Have trans fat.  Have cholesterol.  Are processed or pre-made. ? Count how many carbohydrates you eat and drink each day.   Lifestyle  If you drink alcohol: ? Limit how much you have to:  0-1 drink a day for women who are not pregnant.  0-2 drinks a day for men. ? Know how much alcohol is in your drink. In the [place] one drink equals one 12 oz bottle of beer (333mL), one 5 oz glass of wine (188mL), or one 1 oz glass of hard liquor (63mL).  Do not smoke or use any products that have nicotine or tobacco. If you need help quitting, ask your doctor.  Avoid secondhand smoke.  Do not use drugs. Activity  Try to stay at a healthy weight.  Get at least 30 minutes of exercise on most days, such as: ? Fast walking. ? Biking. ? Swimming.   Medicines  Take over-the-counter and prescription medicines only as told by your doctor.  Avoid taking birth control pills. Talk to your doctor about the risks of taking birth control pills if: ? You are over 36 years old. ? You smoke. ? You get very bad headaches. ? You have had a blood clot. Where to find more information  American Stroke Association: www.strokeassociation.org Get help right away if:  You or a loved one has any signs of a stroke. "BE FAST" is an easy way to remember the warning signs: ? B - Balance. Dizziness, sudden trouble walking, or loss of balance. ? E - Eyes. Trouble seeing or a change in how you see. ? F - Face. Sudden weakness or loss of feeling of the face. The face or eyelid may droop on one side. ? A - Arms. Weakness or loss of feeling in an arm. This happens all of a sudden and most often on one side of the body. ? S - Speech. Sudden trouble speaking, slurred speech, or trouble understanding what people say. ? T - Time. Time to call emergency services. Write down what time symptoms started.  You or a loved one has other signs of a stroke, such as: ? A sudden, very bad  headache with no known cause. ? Feeling like you may vomit (nausea). ? Vomiting. ? A seizure. These symptoms may be an emergency. Get help right away. Call your local emergency services (911 in the [place]  Do not wait to see if the symptoms will go away.  Do not drive yourself to the hospital. Summary  You can help to prevent a stroke by eating healthy, exercising, and not smoking. It also helps to manage any health problems you have.  Do not smoke or use any products that contain nicotine or tobacco.  Get help right away if you or a loved one has any signs of a stroke. This information is not intended to replace advice given to you by your health care provider. Make sure you discuss any questions you have with your health care provider.  Document Revised: 05/30/2020 Document Reviewed: 05/30/2020 Elsevier Patient Education  [place].

## 2021-04-14 ENCOUNTER — Ambulatory Visit: Payer: Medicare HMO

## 2021-04-14 ENCOUNTER — Other Ambulatory Visit: Payer: Self-pay

## 2021-04-14 ENCOUNTER — Ambulatory Visit: Payer: Medicare HMO | Attending: Student | Admitting: Occupational Therapy

## 2021-04-14 ENCOUNTER — Encounter: Payer: Self-pay | Admitting: Occupational Therapy

## 2021-04-14 DIAGNOSIS — R4701 Aphasia: Secondary | ICD-10-CM | POA: Insufficient documentation

## 2021-04-14 DIAGNOSIS — I69851 Hemiplegia and hemiparesis following other cerebrovascular disease affecting right dominant side: Secondary | ICD-10-CM

## 2021-04-14 DIAGNOSIS — R482 Apraxia: Secondary | ICD-10-CM | POA: Diagnosis present

## 2021-04-14 DIAGNOSIS — R1310 Dysphagia, unspecified: Secondary | ICD-10-CM

## 2021-04-14 DIAGNOSIS — R29818 Other symptoms and signs involving the nervous system: Secondary | ICD-10-CM | POA: Insufficient documentation

## 2021-04-14 DIAGNOSIS — R41841 Cognitive communication deficit: Secondary | ICD-10-CM

## 2021-04-14 DIAGNOSIS — R2681 Unsteadiness on feet: Secondary | ICD-10-CM | POA: Insufficient documentation

## 2021-04-14 DIAGNOSIS — R278 Other lack of coordination: Secondary | ICD-10-CM

## 2021-04-14 DIAGNOSIS — R471 Dysarthria and anarthria: Secondary | ICD-10-CM | POA: Diagnosis present

## 2021-04-14 DIAGNOSIS — R41840 Attention and concentration deficit: Secondary | ICD-10-CM | POA: Insufficient documentation

## 2021-04-14 DIAGNOSIS — M6281 Muscle weakness (generalized): Secondary | ICD-10-CM | POA: Insufficient documentation

## 2021-04-14 NOTE — Therapy (Signed)
[facility] [address] [place], [place], [address] Phone: [phone]   Fax:  [phone]  Speech Language Pathology Evaluation  Patient Details  Name: Kara Bailey MRN: [id number] Date of Birth: 08-29-1956 Referring Provider (SLP): Cipriano Mile, NP   Encounter Date: 04/14/2021   End of Session - 04/14/21 1411    Visit Number 1    Number of Visits 17    Date for SLP Re-Evaluation 06/09/21    Authorization Type [employer]    SLP Start Time 0800    SLP Stop Time  0845    SLP Time Calculation (min) 45 min    Activity Tolerance Patient tolerated treatment well           Past Medical History:  Diagnosis Date  . Anxiety   . Chest pain 08/2014   unspecified  . Cirrhosis ([place])   . Depression   . Dizziness 08/2014  . Dyspnea 08/2014  . Emphysema lung ([place])   . GERD (gastroesophageal reflux disease)   . Heart murmur   . Hepatitis C   . Hiatal hernia   . Hypertension   . PAC (premature atrial contraction) 10/11/2014  . PAD (peripheral artery disease) ([place])   . Palpitations   . PVC (premature ventricular contraction) 10/11/2014  . 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 Stroke [facility])    just slurred speech  . Tachycardia 08/2014  . Ventral hernia   . Weakness 08/2014    Past Surgical History:  Procedure Laterality Date  . APPENDECTOMY    . CATARACT EXTRACTION, BILATERAL    . COLONOSCOPY    . COLONOSCOPY WITH PROPOFOL N/A 04/30/2016   Procedure: COLONOSCOPY WITH PROPOFOL;  Surgeon: Daneil Dolin, MD;  [facility];  Service: Endoscopy;  Laterality: N/A;  1230  . ESOPHAGOGASTRODUODENOSCOPY     approximately 2010  . ESOPHAGOGASTRODUODENOSCOPY (EGD) WITH PROPOFOL N/A 04/30/2016   Procedure: ESOPHAGOGASTRODUODENOSCOPY (EGD) WITH PROPOFOL;  Surgeon: Daneil Dolin, MD;  [facility];  Service: Endoscopy;  Laterality: N/A;  . LEFT HEART CATHETERIZATION WITH CORONARY  ANGIOGRAM N/A 10/29/2014   07-14-20- pt denies this Procedure: LEFT HEART CATHETERIZATION WITH CORONARY ANGIOGRAM;  Surgeon: Burnell Blanks, MD;  Location: [facility] CATH LAB;  Service: Cardiovascular;  Laterality: N/A;  . Venia Minks DILATION N/A 04/30/2016   Procedure: Venia Minks DILATION;  Surgeon: Daneil Dolin, MD;  [facility];  Service: Endoscopy;  Laterality: N/A;  . POLYPECTOMY  04/30/2016   Procedure: POLYPECTOMY;  Surgeon: Daneil Dolin, MD;  [facility];  Service: Endoscopy;;  Sigmoid colon polyp removed via hot snare  . UPPER GASTROINTESTINAL ENDOSCOPY      There were no vitals filed for this visit.       SLP Evaluation [facility] - 04/14/21 0750      SLP Visit Information   SLP Received On 03/28/21    Referring Provider (SLP) Cipriano Mile, NP    Onset Date 12-29-20    Medical Diagnosis CVA, hx of TIA, aphasia      Subjective   Patient/Family Stated Goal "I want to...um.. be able to...talk. Maybe not normal but close to it. Maybe remembering because I forget real quick"      Pain Assessment   Currently in Pain? No/denies      General Information   HPI Kara Bailey is a 65 y.o. female with a history of smoking, PAD, HTN, ETOH abuse, chronic hepatitis C with hepatic coma,GAD, esophageal  stricture,  recent episode of possible hematemesis, who presented to [facility] ED  on 12/29/2020 with right-sided weakness, difficulty swallowing and difficulty speaking x3 days. In [place] ED, MRI showed left thalamic infarct and transferred to [facility] for further evaluation and care.  Evaluated by Dr. Leonie Man and infarct likely secondary to small vessel disease.  CTA head/neck unremarkable. MR brain showed areas of old hemorrhagic infarctions in the right superior cerebellum, right basal ganglia and radiating white matter tracts.  Residual deficits of moderate dysarthria, expressive aphasia, dysphagia, mild right facial weakness and right hemiparesis.  Evaluated by therapies who  recommended HH PT/OT/SLP and discharged home in stable condition on 12/31/2020. Reports residual speech difficulties and right-sided weakness with numbness which has been slowly improving. Mild dysphagia with improvement currently on regular diet without difficulty.      Balance Screen   Has the patient fallen in the past 6 months No      Prior Functional Status   Cognitive/Linguistic Baseline Within functional limits    Type of Home Apartment     Lives With Significant other    Available Support Family    Education HS    Vocation On disability   factory     Pain Assessment   Pain Assessment --      Cognition   Overall Cognitive Status Impaired/Different from baseline    Area of Impairment Memory;Attention;Awareness    Current Attention Level Selective;Alternating;Divided    Attention Comments difficulty alternating/dividing attention while cooking    Memory Decreased short-term memory;Decreased recall of precautions    Memory Comments reports difficulty with recall of medicines, items on stove, conversations    Awareness Emergent;Anticipatory    Awareness Comments reduced error awareness      Auditory Comprehension   Overall Auditory Comprehension Impaired    Yes/No Questions Impaired    Conversation Moderately complex    Interfering Components Motor planning;Processing speed;Working Research scientist (physical sciences) time      Reading Comprehension   Reading Status Within funtional limits      Expression   Primary Mode of Expression Verbal      Verbal Expression   Overall Verbal Expression Impaired    Initiation No impairment    Level of Generative/Spontaneous Verbalization Conversation    Naming Impairment    Confrontation 75-100% accurate    Interfering Components Attention      Oral Motor/Sensory Function   Overall Oral Motor/Sensory Function Impaired    Labial ROM Within Functional Limits    Labial Strength Reduced    Lingual Symmetry Within  Functional Limits    Lingual Strength Reduced Left    Lingual Coordination Reduced    Facial ROM Within Functional Limits      Motor Speech   Overall Motor Speech Impaired    Respiration Impaired    Level of Impairment Conversation    Articulation Impaired    Level of Impairment Conversation    Intelligibility Intelligibility reduced    Word 75-100% accurate    Phrase 75-100% accurate    Sentence 50-74% accurate    Conversation 50-74% accurate    Motor Planning Impaired    Level of Impairment Phrase    Motor Speech Errors Inconsistent    Effective Techniques Slow rate      Standardized Assessments   Standardized Assessments  Other Assessment   Quick Aphasia Battery=mild aphasia  SLP Education - 04/14/21 1410    Education Details eval results, possible goals, memory strategies    Person(s) Educated Patient    Methods Explanation;Demonstration;Handout    Comprehension Verbalized understanding;Returned demonstration;Need further instruction            SLP Short Term Goals - 04/14/21 1420      SLP SHORT TERM GOAL #1   Title Pt will demonstrate dysarthria compensations in 10 minute simple to mod complex conversation with min A over 2 sessions    Time 4    Period Weeks    Status New      SLP SHORT TERM GOAL #2   Title Pt will use memory/attention compensations for appointments, medicine management, and other daily activities with occasional min A over 3 sessions.    Time 4    Period Weeks    Status New      SLP SHORT TERM GOAL #3   Title Pt will ID and correct word finding errors with 80% accuracy given occasional min A over 2 sessions    Time 4    Period Weeks    Status New      SLP SHORT TERM GOAL #4   Title Pt will verbalize 3 safety concerns in the home and generate solutions to them with occasional min A over 2 sessions    Time 4    Period Weeks    Status New      SLP SHORT TERM GOAL #5   Title Pt will complete  dysphagia HEP to optimize swallow function with occasional min A over 2 sessions    Time 4    Period Weeks    Status New            SLP Long Term Goals - 04/14/21 1544      SLP LONG TERM GOAL #1   Title Pt will demonstrate dysarthria compensations in 15 minute simple to mod complex conversation with min A over 2 sessions    Time 8    Period Weeks    Status New      SLP LONG TERM GOAL #2   Title Pt will use memory/attention compensations for appointments, medicine management, and other daily activities with rare min A over 3 sessions.    Time 8    Period Weeks    Status New      SLP LONG TERM GOAL #3   Title Pt will ID and correct word finding errors with 95% accuracy given occasional min A over 2 sessions    Time 8    Period Weeks    Status New      SLP LONG TERM GOAL #4   Title Pt will report improved diet tolerance and reduced s/sx of aspiration with rare min over 2 sessions    Time 8    Period Weeks    Status New      SLP LONG TERM GOAL #5   Title Pt will undergo repeat objective swallow study as needed if s/sx of aspiration persist    Time 8    Period Weeks    Status New            Plan - 04/14/21 1412    Clinical Impression Statement Kara Bailey was referred for OPST evaluation to assess for changes in cognitive communication and swallowing s/p CVA in February 2022. Pt reports she is experiencing increased difficulty with speech intelligibility, word finding, short term recall, and swallowing. Pt stated her speech is "very slow"  and she has to "think hard about everything. It's so depressing." Occasional rushes in speech and intermittent decreased speech intelligibility noted in conversation and during structured tasks. Pt endorsed she usually forgets topics in conversation requiring repetition from conversational partner. She has forgotten her medications as well as left items on stove unattended while cooking. She is currently avoiding social situations due to change  in speech, cognition, and swallowing. Increased difficulty with thin liquids indicated and exhibited, with prolonged oral holding of liquids demonstrated. Increased coughing with thin liquids and intermittent coughing with regular textures reported. MBSS completed in April 2022 with recommendation for regular textures and thin liquids given trace penetration and no aspiration. Reduced lingual coordination and strength observed, with possible oral apraxia suspected. Quick Aphasia Battery completed due to patient reported concern for word finding, which revealed mild language impairment. Further cognitive linguistic assessment is warranted to determine severity of cognitive deficits. Frustration reported related to current deficits impacting overall functioning. Skilled ST is recommended to address dysarthria, aphasia, cognitive changes, and swallow deficits impacting pt's overall safety and functional independence compared to baseline.    Speech Therapy Frequency 2x / week    Duration 8 weeks   17 visits   Treatment/Interventions Aspiration precaution training;Oral motor exercises;Compensatory strategies;Pharyngeal strengthening exercises;Functional tasks;Patient/family education;Diet toleration management by SLP;Cognitive reorganization;Multimodal communcation approach;Language facilitation;Compensatory techniques;Internal/external aids;SLP instruction and feedback    Potential to Achieve Goals Fair    Potential Considerations Ability to learn/carryover information;Severity of impairments    SLP Home Exercise Plan provided    Consulted and Agree with Plan of Care Patient           Patient will benefit from skilled therapeutic intervention in order to improve the following deficits and impairments:   Dysarthria and anarthria  Aphasia  Cognitive communication deficit  Dysphagia, unspecified type    Problem List Patient Active Problem List   Diagnosis Date Noted  . Acute CVA (cerebrovascular  accident) ([place]) 12/29/2020  . Peripheral arterial disease ([place]) 12/20/2020  . History of esophageal stricture 07/08/2020  . Insomnia 12/02/2017  . Chronic cough 08/14/2017  . Poor appetite 08/29/2016  . Peptic stricture of esophagus   . History of colonic polyps   . Hiatal hernia 03/16/2016  . Odynophagia 03/16/2016  . Dysphagia 03/16/2016  . Alcohol abuse 10/26/2015  . Essential hypertension 09/26/2015  . PAC (premature atrial contraction) 10/11/2014  . PVC (premature ventricular contraction) 10/11/2014  . Back muscle spasm 06/15/2014  . Generalized anxiety disorder 06/15/2014  . Smoking 06/15/2014  . Chronic hepatitis C without hepatic coma ([place]) 05/28/2013    Alinda Deem, MA CCC-SLP 04/14/2021, 3:59 PM  [facility] [address] [place], [place], [address] Phone: [phone]   Fax:  [phone]  Name: Kara Bailey MRN: [id number] Date of Birth: [birth year]

## 2021-04-14 NOTE — Therapy (Signed)
[facility] [address] [place], [place], [address] Phone: [phone]   Fax:  [phone]  Occupational Therapy Evaluation  Patient Details  Name: Kara Bailey MRN: [id number] Date of Birth: 1956-03-03 Referring Provider (OT): Cipriano Mile, NP   Encounter Date: 04/14/2021   OT End of Session - 04/14/21 0942    Visit Number 1    Number of Visits 9    Date for OT Re-Evaluation 05/26/21   anticipate only 4 weeks   Authorization Type [employer] Medicare/Medicaid    Authorization Time Period Covered 100%    Authorization - Number of Visits 10    Progress Note Due on Visit 10    OT Start Time 0845    OT Stop Time 0925    OT Time Calculation (min) 40 min    Activity Tolerance Patient tolerated treatment well    Behavior During Therapy [facility] for tasks assessed/performed           Past Medical History:  Diagnosis Date  . Anxiety   . Chest pain 08/2014   unspecified  . Cirrhosis ([place])   . Depression   . Dizziness 08/2014  . Dyspnea 08/2014  . Emphysema lung ([place])   . GERD (gastroesophageal reflux disease)   . Heart murmur   . Hepatitis C   . Hiatal hernia   . Hypertension   . PAC (premature atrial contraction) 10/11/2014  . PAD (peripheral artery disease) ([place])   . Palpitations   . PVC (premature ventricular contraction) 10/11/2014  . 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 Stroke [facility])    just slurred speech  . Tachycardia 08/2014  . Ventral hernia   . Weakness 08/2014    Past Surgical History:  Procedure Laterality Date  . APPENDECTOMY    . CATARACT EXTRACTION, BILATERAL    . COLONOSCOPY    . COLONOSCOPY WITH PROPOFOL N/A 04/30/2016   Procedure: COLONOSCOPY WITH PROPOFOL;  Surgeon: Daneil Dolin, MD;  [facility];  Service: Endoscopy;  Laterality: N/A;  1230  . ESOPHAGOGASTRODUODENOSCOPY     approximately 2010  . ESOPHAGOGASTRODUODENOSCOPY (EGD) WITH PROPOFOL N/A 04/30/2016    Procedure: ESOPHAGOGASTRODUODENOSCOPY (EGD) WITH PROPOFOL;  Surgeon: Daneil Dolin, MD;  [facility];  Service: Endoscopy;  Laterality: N/A;  . LEFT HEART CATHETERIZATION WITH CORONARY ANGIOGRAM N/A 10/29/2014   07-14-20- pt denies this Procedure: LEFT HEART CATHETERIZATION WITH CORONARY ANGIOGRAM;  Surgeon: Burnell Blanks, MD;  Location: [facility] CATH LAB;  Service: Cardiovascular;  Laterality: N/A;  . Venia Minks DILATION N/A 04/30/2016   Procedure: Venia Minks DILATION;  Surgeon: Daneil Dolin, MD;  [facility];  Service: Endoscopy;  Laterality: N/A;  . POLYPECTOMY  04/30/2016   Procedure: POLYPECTOMY;  Surgeon: Daneil Dolin, MD;  [facility];  Service: Endoscopy;;  Sigmoid colon polyp removed via hot snare  . UPPER GASTROINTESTINAL ENDOSCOPY      There were no vitals filed for this visit.   Subjective Assessment - 04/14/21 0933    Subjective  Pt presents to [facility] s/p CVA on 12/29/2020. Pt finished with HHPT and presents to OPOT for right hemiparesis. Pt reports deficits with walking and her left leg.    Pertinent History PMH smoking, PAD, HTN, ETOH abuse, chronic hepatitis C with hepatic coma,GAD, esophageal stricture,  recent episode of possible hematemesis    Limitations Fall Risk    Patient Stated Goals "all of that is ok"    Currently in Pain? No/denies  [facility] OT Assessment - 04/14/21 0852      Assessment   Medical Diagnosis CVA    Referring Provider (OT) Cipriano Mile, NP    Onset Date/Surgical Date 12/29/20    Hand Dominance Right    Prior Therapy HH PT and ST      Precautions   Precautions Fall      Balance Screen   Has the patient fallen in the past 6 months No      Home  Environment   Family/patient expects to be discharged to: Private residence    Living Arrangements Spouse/significant other   + dog   Available Help at Discharge Family    Type of [place] Access Level entry    Bathroom Shower/Tub  Tub/Shower unit    [place]      Prior Function   Level of [place] Retired    Leisure watch TV      ADL   Eating/Feeding Modified independent    Grooming Modified independent    Upper Body Bathing Modified independent    Lower Body Bathing Modified independent    Upper Body Dressing Independent    Lower Body Dressing Modified independent    Toilet Transfer Modified independent    Toileting - [place] independent    Toileting -  Chiropodist with back      IADL   Prior Level of [place] care of all shopping needs independently   driving short distances   Prior Level of Function Light Housekeeping independent    Light Housekeeping Does personal laundry completely;Performs light daily tasks such as dishwashing, bed making    Prior Level of Function Meal Prep independent    Meal Prep Plans, prepares and serves adequate meals independently   has a little trouble with cutting/slicing onions but "gets it done"   Prior Level of Function Community Mobility driving    Community Mobility Drives own vehicle   only short distances - drove self to therapy today   Prior Level of Function Medication Managment independent    Medication Management Is responsible for taking medication in correct dosages at correct time   sometimes forgets to take them - keeps pill box   Prior Level of Function Financial Management SO handles finances most of the time    Financial Management Requires assistance   significant other managing     Written Expression   Dominant Hand Right    Handwriting Increased time;50% legible   very slow and intentional with the letters     Vision - History   Baseline Vision Bifocals      Vision Assessment   Comment pt reports increased blurriness since CVA but no diplopia. Will  continue to assess.      Cognition   Overall Cognitive Status No family/caregiver present to determine baseline cognitive functioning    Bradyphrenia Yes      Observation/Other Assessments   Focus on Therapeutic Outcomes (FOTO)  59% Upper Extremity   predicted 63%     Sensation   Light Touch Appears Intact    Hot/Cold Appears Intact      Coordination   Finger Nose Finger Test impaired    9 Hole Peg Test Right;Left    Right 9 Hole Peg Test 32.6s    Left [address]  Peg Test 33.63s    Box and Blocks R 32, L 28      Praxis   Praxis Impaired    Praxis Impairment Details Motor planning      ROM / Strength   AROM / PROM / Strength AROM;Strength      AROM   Overall AROM  Within functional limits for tasks performed      Strength   Overall Strength Within functional limits for tasks performed    Overall Strength Comments grossly 4+/5 BUE      Hand Function   Right Hand Gross Grasp Functional    Right Hand Grip (lbs) 32.6    Left Hand Gross Grasp Functional    Left Hand Grip (lbs) 26.6                           OT Education - 04/14/21 1309    Education Details Education provided on role and purpose of OT    Person(s) Educated Patient    Methods Explanation    Comprehension Verbalized understanding               OT Long Term Goals - 04/14/21 1054      OT LONG TERM GOAL #1   Title Pt will be independent with HEP for coordination and grip strength    Time 4    Period Weeks    Status New    Target Date 05/26/21   4 weeks anticipated so LTGs only. 6 week POC to allow for any missed visits     OT LONG TERM GOAL #2   Title Pt will improve grip strength, bilaterally, to a functional strength (35lbs or greater).    Baseline R 32.6, L 26.6    Time 4    Period Weeks    Status New      OT LONG TERM GOAL #3   Title Pt will perform physical and cognitive task simultaneously with 90% accuracy    Time 4    Period Weeks    Status New      OT LONG TERM  GOAL #4   Title Pt will verbalize understanding of adapted strategies and/or equipment PRN to increase safety and independence with ADLs and IADLs (cutting vegetables, etc)    Time 4    Period Weeks    Status New                 Plan - 04/14/21 0935    Clinical Impression Statement Pt is a 65 year old that presents to Neuro OPOT s/p CVA on 12/29/2020 with PMH significant of smoking, PAD, HTN, ETOH abuse, chronic hepatitis C with hepatic coma,GAD, esophageal stricture,  recent episode of possible hematemesis. Pt presented to [facility] ED  on 12/29/2020 with right-sided weakness, difficulty swallowing and difficulty speaking x 3 days. Pt presents to Neuro OPOT with referral diagnosis of R. Hemiparesis d/t CVA with deficits noted with gross motor and fine motor coordination, grip strength and cognitive deficits and decreased independence with ADLs and IADLs. Skilled occupational therapy is recommended to target listed areas of deficit and increase independence with ADLs and IADLs.    OT Occupational Profile and History Problem Focused Assessment - Including review of records relating to presenting problem    Occupational performance deficits (Please refer to evaluation for details): ADL's;IADL's    Body Structure / Function / Physical Skills ADL;FMC;Coordination;Decreased knowledge of use of DME;Dexterity;GMC;Strength;UE functional use;Vision;IADL  Cognitive Skills Attention;Sequencing;Safety Awareness;Problem Solve    Rehab Potential Good    Clinical Decision Making Limited treatment options, no task modification necessary    Comorbidities Affecting Occupational Performance: None    Modification or Assistance to Complete Evaluation  No modification of tasks or assist necessary to complete eval    OT Frequency 2x / week    OT Duration 6 weeks   8 visits - anticipate over 4 weeks but may take 6 weeks.   OT Treatment/Interventions Self-care/ADL training;Neuromuscular education;Energy  conservation;Functional Mobility Training;Patient/family education;Cognitive remediation/compensation;Therapeutic exercise;Therapeutic activities;DME and/or AE instruction;Moist Heat;Fluidtherapy;Visual/perceptual remediation/compensation    Plan Coordination HEP    Recommended Other Services get scheduled for PT (referral in Epic)    Consulted and Agree with Plan of Care Patient           Patient will benefit from skilled therapeutic intervention in order to improve the following deficits and impairments:   Body Structure / Function / Physical Skills: ADL,FMC,Coordination,Decreased knowledge of use of DME,Dexterity,GMC,Strength,UE functional use,Vision,IADL Cognitive Skills: Attention,Sequencing,Safety Awareness,Problem Solve     Visit Diagnosis: Other lack of coordination  Attention and concentration deficit  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 Acute CVA (cerebrovascular accident) ([place]) 12/29/2020  . Peripheral arterial disease ([place]) 12/20/2020  . History of esophageal stricture 07/08/2020  . Insomnia 12/02/2017  . Chronic cough 08/14/2017  . Poor appetite 08/29/2016  . Peptic stricture of esophagus   . History of colonic polyps   . Hiatal hernia 03/16/2016  . Odynophagia 03/16/2016  . Dysphagia 03/16/2016  . Alcohol abuse 10/26/2015  . Essential hypertension 09/26/2015  . PAC (premature atrial contraction) 10/11/2014  . PVC (premature ventricular contraction) 10/11/2014  . Back muscle spasm 06/15/2014  . Generalized anxiety disorder 06/15/2014  . Smoking 06/15/2014  . Chronic hepatitis C without hepatic coma ([place]) 05/28/2013    Zachery Conch MOT, OTR/L  04/14/2021, 1:09 PM  [facility] [address] [place], [place],  [address] Phone: [phone]   Fax:  [phone]  Name: Kara Bailey MRN: [id number] Date of Birth: 1956/09/03

## 2021-04-14 NOTE — Patient Instructions (Signed)
  Memory Compensation Strategies  1. Use "WARM" strategy. W= write it down A=  associate it R=  repeat it M=  make a mental picture  2. You can keep a Social worker. Use a 3-ring notebook with sections for the following:  calendar, important names and phone numbers, medications, doctors' names/phone numbers, "to do list"/reminders, and a section to journal what you did each day  3. Use a calendar to write appointments down.  4. Write yourself a schedule for the day.  This can be placed on the calendar or in a separate section of the Memory Notebook.  Keeping a regular schedule can help memory.  5. Use medication organizer with sections for each day or morning/evening pills  You may need help loading it  6. Keep a basket, or pegboard by the door.   Place items that you need to take out with you in the basket or on the pegboard.  You may also want to include a message board for reminders.  7. Use sticky notes. Place sticky notes with reminders in a place where the task is performed.  For example:  "turn off the stove" placed by the stove, "lock the door" placed on the door at eye level, "take your medications" on the bathroom mirror or by the place where you normally take your medications  8. Use alarms/timers.  Use while cooking to remind yourself to check on food or as a reminder to take your medicine, or as a reminder to make a call, or as a reminder to perform another task, etc.  9. Use a voice recorder app or small tape recorder to record important information and notes for yourself. Go back at the end of the day and listen to these.    Swallow strategies:  -Slow down -Small bites/sips -Sit upright while eating  -Remain upright after eating ~30 mins

## 2021-04-17 NOTE — Progress Notes (Signed)
I agree with the above plan 

## 2021-04-18 ENCOUNTER — Encounter: Payer: Self-pay | Admitting: Occupational Therapy

## 2021-04-18 ENCOUNTER — Encounter: Payer: Self-pay | Admitting: Physical Therapy

## 2021-04-18 ENCOUNTER — Ambulatory Visit: Payer: Medicare HMO | Admitting: Physical Therapy

## 2021-04-18 ENCOUNTER — Ambulatory Visit: Payer: Medicare HMO

## 2021-04-18 ENCOUNTER — Ambulatory Visit: Payer: Medicare HMO | Admitting: Occupational Therapy

## 2021-04-18 ENCOUNTER — Other Ambulatory Visit: Payer: Self-pay

## 2021-04-18 DIAGNOSIS — I69851 Hemiplegia and hemiparesis following other cerebrovascular disease affecting right dominant side: Secondary | ICD-10-CM

## 2021-04-18 DIAGNOSIS — R278 Other lack of coordination: Secondary | ICD-10-CM | POA: Diagnosis not present

## 2021-04-18 DIAGNOSIS — R29818 Other symptoms and signs involving the nervous system: Secondary | ICD-10-CM

## 2021-04-18 DIAGNOSIS — R41840 Attention and concentration deficit: Secondary | ICD-10-CM

## 2021-04-18 DIAGNOSIS — R41841 Cognitive communication deficit: Secondary | ICD-10-CM

## 2021-04-18 DIAGNOSIS — M6281 Muscle weakness (generalized): Secondary | ICD-10-CM

## 2021-04-18 DIAGNOSIS — R2681 Unsteadiness on feet: Secondary | ICD-10-CM

## 2021-04-18 NOTE — Patient Instructions (Signed)
  SLOW LOUD OVER-ENNUNCIATE PAUSE  PA TA KA  PATA TAKA KAPA PATAKA  BUTTERCUP  CATERPILLAR  BASEBALLL PLAYER  [place]  [place]  SLOW AND BIG - EXAGGERATE YOUR MOUTH, MAKE EACH CONSONANT     Purpose: To improve lip and tongue strength in order to help people understand you. These exercises can be done while looking in a mirror.  Do them twice a day.  Lips  1. Press hard and briefly hold all the beginning sounds in these words: Repeat each set 2 times     "ma ma ma"    "boo boo boo"  "pa pa pa."          "[place]" "bye bye bye"  "pie pie pie"          "me me me"  "bee bee bee"  "pea pea pea"   2. Pucker your lips tightly and say "OOOO," then smile wide and say "EEEE."  Repeat _2_ times.  3. Practice whistling for __30__ seconds.  4. "Blow out" candles.  Repeat _10___ times.  5. Blow kisses - make them LOUD!  Repeat __10__ times.   Tip of Tongue  1. Say the sound  "ta ta ta," "la la la,"          and "Engineering geologist."  Repeat __2__ times.               "tee tee tee" "lee lee lee"   "dee dee dee"        "too too too" "[place]"  "do do do"  2. Say "time," "took," "take," "town," and "Tom."  Repeat __2__ times.  3. Say "long," "look," "low," "lay" and "lie."  Repeat _2__ times.  4. Say "name," "neck," "not," "new," and "no."  Repeat __2_ times.  5. Say "down," "dip," "dot," "Don," and "do."  Repeat __2__ times   Back of Tongue  1. Say the sounds "ka ka ka" and "go go go."   Repeat ___2_ times.       "cow cow cow" "guy guy guy"       "koo koo koo" "goo goo goo"  2. Say "kick," "cake," "Anda Kraft," "key," "keep," "kite," and "Ken."  Repeat __2__ times.  3. Say "gate," "gag," "got," "get," "gone," and "go."  Repeat __2__ times.

## 2021-04-18 NOTE — Therapy (Signed)
[facility] [address] [place], [place], [address] Phone: [phone]   Fax:  [phone]  Physical Therapy Evaluation  Patient Details  Name: Kara Bailey MRN: [id number] Date of Birth: 10/17/1956 Referring Provider (PT): Cipriano Mile, NP   Encounter Date: 04/18/2021   PT End of Session - 04/18/21 1412    Visit Number 1    Number of Visits 17    Date for PT Re-Evaluation 07/17/21   written for 60 day POC   Authorization Type [employer] - requires auth.    PT Start Time 1102    PT Stop Time 1145    PT Time Calculation (min) 43 min    Equipment Utilized During Treatment Gait belt    Activity Tolerance Patient tolerated treatment well    Behavior During Therapy WFL for tasks assessed/performed           Past Medical History:  Diagnosis Date  . Anxiety   . Chest pain 08/2014   unspecified  . Cirrhosis ([place])   . Depression   . Dizziness 08/2014  . Dyspnea 08/2014  . Emphysema lung ([place])   . GERD (gastroesophageal reflux disease)   . Heart murmur   . Hepatitis C   . Hiatal hernia   . Hypertension   . PAC (premature atrial contraction) 10/11/2014  . PAD (peripheral artery disease) ([place])   . Palpitations   . PVC (premature ventricular contraction) 10/11/2014  . 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 Stroke [facility])    just slurred speech  . Tachycardia 08/2014  . Ventral hernia   . Weakness 08/2014    Past Surgical History:  Procedure Laterality Date  . APPENDECTOMY    . CATARACT EXTRACTION, BILATERAL    . COLONOSCOPY    . COLONOSCOPY WITH PROPOFOL N/A 04/30/2016   Procedure: COLONOSCOPY WITH PROPOFOL;  Surgeon: Daneil Dolin, MD;  [facility];  Service: Endoscopy;  Laterality: N/A;  1230  . ESOPHAGOGASTRODUODENOSCOPY     approximately 2010  . ESOPHAGOGASTRODUODENOSCOPY (EGD) WITH PROPOFOL N/A 04/30/2016   Procedure: ESOPHAGOGASTRODUODENOSCOPY (EGD) WITH PROPOFOL;   Surgeon: Daneil Dolin, MD;  Location: [facility];  Service: Endoscopy;  Laterality: N/A;  . LEFT HEART CATHETERIZATION WITH CORONARY ANGIOGRAM N/A 10/29/2014   07-14-20- pt denies this Procedure: LEFT HEART CATHETERIZATION WITH CORONARY ANGIOGRAM;  Surgeon: Burnell Blanks, MD;  Location: [facility] CATH LAB;  Service: Cardiovascular;  Laterality: N/A;  . Venia Minks DILATION N/A 04/30/2016   Procedure: Venia Minks DILATION;  Surgeon: Daneil Dolin, MD;  Location: [facility];  Service: Endoscopy;  Laterality: N/A;  . POLYPECTOMY  04/30/2016   Procedure: POLYPECTOMY;  Surgeon: Daneil Dolin, MD;  Location: [facility];  Service: Endoscopy;;  Sigmoid colon polyp removed via hot snare  . UPPER GASTROINTESTINAL ENDOSCOPY      There were no vitals filed for this visit.    Subjective Assessment - 04/18/21 1104    Subjective s/p CVA (left thalamic infarct ) on 12/29/2020. Pt finished with HHPT and presents with R hemiparesis. Ambulates with no AD, except when going out to the mailbox she uses her walker because she gets tired really easy (or for longer distances to help with balance). No falls. No numbness. Pt reports she is more scared to put weight on her LLE compared to her R. Reports sometimes feeling lightheaded with quick head motions or turning around too quickly.    Pertinent History smoking, PAD, HTN, ETOH abuse, chronic  hepatitis C with hepatic coma,GAD, esophageal stricture    Limitations Walking;Standing    Patient Stated Goals wants to work on walking normally    Currently in Pain? No/denies              [facility] PT Assessment - 04/18/21 1111      Assessment   Medical Diagnosis CVA    Referring Provider (PT) Cipriano Mile, NP    Onset Date/Surgical Date 12/29/20    Hand Dominance Right    Prior Therapy HH PT and ST      Precautions   Precautions Fall      Balance Screen   Has the patient fallen in the past 6 months No    Has the patient had a decrease in activity level because  of a fear of falling?  Yes    Is the patient reluctant to leave their home because of a fear of falling?  Yes      [place] Private residence    Living Arrangements Spouse/significant other    Type of [place] Access Level entry    Home Layout One level    Greasy - standard    Additional Comments reports SO can't help with much, reports grandkids and daughter are able to help with cleaning. son's house has stairs and has not been able to visit because she can't do stairs.      Prior Function   Level of Independence Independent    Vocation Retired    Leisure watch TV      Observation/Other Assessments   Focus on Therapeutic Outcomes (FOTO)  50%   predicted 60% - helped pt fill out during session as she came late and did not have time     Sensation   Light Touch Impaired Detail    Light Touch Impaired Details Impaired RLE   impaired more proximally   Additional Comments reports B feet are numb      Coordination   Gross Motor Movements are Fluid and Coordinated No    Heel Shin Test impaired on R side due to weakness      Posture/Postural Control   Posture/Postural Control Postural limitations    Postural Limitations Forward head;Weight shift right    Posture Comments in sitting trunk lean to R with elongation and trunk shortening on L      ROM / Strength   AROM / PROM / Strength Strength      Strength   Strength Assessment Site Hip;Knee;Ankle    Right/Left Hip Left;Right    Right Hip Flexion 3-/5    Left Hip Flexion 3+/5    Right/Left Knee Right;Left    Right Knee Flexion 4+/5    Right Knee Extension 4+/5    Left Knee Flexion 4+/5    Left Knee Extension 4+/5    Right/Left Ankle Right;Left    Right Ankle Dorsiflexion 3+/5    Left Ankle Dorsiflexion 4-/5      Transfers   Transfers Sit to Stand;Stand to Sit    Sit to Stand 5: Supervision;With upper extremity assist;From chair/3-in-1    Five time sit to stand  comments  64.32 seconds    Stand to Sit 5: Supervision;With upper extremity assist;Uncontrolled descent    Comments reports RLE tired after performing 5 sit <> stands      Ambulation/Gait   Ambulation/Gait Yes    Ambulation/Gait Assistance 5: Supervision    Ambulation Distance (Feet) --  approx. 100'   Assistive device None    Gait Pattern Decreased arm swing - right;Step-through pattern;Decreased stance time - right;Decreased hip/knee flexion - right;Decreased dorsiflexion - right;Poor foot clearance - right;Lateral trunk lean to right    Ambulation Surface Level;Indoor    Gait velocity 16.59 seconds = 1.97 ft/sec      Standardized Balance Assessment   Standardized Balance Assessment Berg Balance Test;Timed Up and Go Test      Berg Balance Test   Sit to Stand Able to stand  independently using hands    Standing Unsupported Able to stand safely 2 minutes    Sitting with Back Unsupported but Feet Supported on Floor or Stool Able to sit safely and securely 2 minutes    Stand to Sit Controls descent by using hands    Standing Unsupported with Eyes Closed Able to stand 10 seconds with supervision      Timed Up and Go Test   Normal TUG (seconds) 20.47                      Objective measurements completed on examination: See above findings.               PT Education - 04/18/21 1412    Education Details clinical findings, POC    Person(s) Educated Patient    Methods Explanation    Comprehension Verbalized understanding            PT Short Term Goals - 04/18/21 1415      PT SHORT TERM GOAL #1   Title Pt will be independent with initial HEP in order to build upon functional gains made in therapy. ALL STGS DUE 05/16/21    Baseline currently dependent    Time 4    Period Weeks    Status New    Target Date 05/16/21      PT SHORT TERM GOAL #2   Title Pt will finish BERG assessment to determine fall risk with LTG written    Baseline did not have time to  finish during eval.    Time 4    Period Weeks    Status New      PT SHORT TERM GOAL #3   Title Pt will decr TUG time to 17 seconds or less with no AD in order to demo decr fall risk.    Baseline 20.47 seconds    Time 4    Period Weeks    Status New      PT SHORT TERM GOAL #4   Title Pt will improve gait speed to at least 2.2 ft/sec with no AD vs. LRAD in order to demo decr fall risk.    Baseline 1.97 ft/sec    Time 4    Period Weeks    Status New      PT SHORT TERM GOAL #5   Title Pt will decr 5x sit <> stand time to 50 seconds or less in order to demo improved BLE strength and balance.    Baseline 64.32 seconds no UE support    Time 4    Period Weeks    Status New             PT Long Term Goals - 04/18/21 1420      PT LONG TERM GOAL #1   Title BERG goal to be written as appropriate to demo decr fall risk. ALL LTGS DUE 06/13/21    Baseline not yet assessed.  Time 8    Period Weeks    Status New    Target Date 06/13/21      PT LONG TERM GOAL #2   Title Pt will go ascend and descend 4 steps with single handrail vs. use of LRAD with supervision in order to be able to go to her son's house.    Baseline can not perform stairs    Time 8    Period Weeks    Status New      PT LONG TERM GOAL #3   Title Pt will decr 5x sit <> stand time to 40 seconds or less in order to demo improved BLE strength and balance.    Baseline 64.32 seconds no UE support    Time 8    Period Weeks    Status New      PT LONG TERM GOAL #4   Title Pt will ambulate at least 300' outdoors over unlevel paved surfaces with LRAD in order to demo improved community mobility.    Baseline not yet assessed.    Time 8    Period Weeks    Status New      PT LONG TERM GOAL #5   Title Pt will decr TUG time to 15 seconds or less with no AD in order to demo decr fall risk.    Baseline 20.47 seconds    Time 8    Period Weeks    Status New      Additional Long Term Goals   Additional Long Term Goals  Yes      PT LONG TERM GOAL #6   Title Pt will improve FOTO score to at least 60% in order to demo improved functional outcomes.    Baseline 50%    Time 8    Period Weeks    Status New                  Plan - 04/18/21 1424    Clinical Impression Statement Patient is a 65 year old female referred to Neuro OPPT s/p  CVA (left thalamic infarct ) on 12/29/2020. Pt finished with HHPT and presents with R hemiparesis.   Pt's PMH is significant [id number], PAD, HTN, ETOH abuse, chronic hepatitis C with hepatic coma,GAD, esophageal stricture. The following deficits were present during the exam:  impaired strength, postural dysfunction, impaired sensation, gait abnormalities, impaired balance, impaired coordination, decr endurance. T. Based on TUG and 5x sit <> stand pt is an incr risk for falls. Pt's gait speed of 1.97 ft/sec indicates that pt is a limited community ambulator with no AD. Pt would benefit from skilled PT to address these impairments and functional limitations to maximize functional mobility independence    Personal Factors and Comorbidities Comorbidity 3+;Past/Current Experience    Comorbidities smoking, PAD, HTN, ETOH abuse, chronic hepatitis C with hepatic coma,GAD, esophageal stricture    Examination-Activity Limitations Locomotion Level;Transfers;Stairs;Squat;Carry    Examination-Participation Restrictions Cleaning;Community Activity;Laundry    Stability/Clinical Decision Making Stable/Uncomplicated    Clinical Decision Making Low    Rehab Potential Good    PT Frequency 2x / week    PT Duration 8 weeks    PT Treatment/Interventions ADLs/Self Care Home Management;DME Instruction;Gait training;Stair training;Therapeutic activities;Functional mobility training;Therapeutic exercise;Balance training;Neuromuscular re-education;Orthotic Fit/Training;Patient/family education;Vestibular    PT Next Visit Plan finish performing BERG, initial HEP for strength/balance. might need to try  foot up brace    Consulted and Agree with Plan of Care Patient  Patient will benefit from skilled therapeutic intervention in order to improve the following deficits and impairments:  Abnormal gait,Decreased activity tolerance,Decreased balance,Decreased coordination,Decreased endurance,Decreased knowledge of use of DME,Difficulty walking,Decreased strength,Dizziness,Impaired sensation,Postural dysfunction  Visit Diagnosis: 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)  Muscle weakness (generalized)  Unsteadiness on feet  Other symptoms and signs involving the nervous system     Problem List Patient Active Problem List   Diagnosis Date Noted  . Acute CVA (cerebrovascular accident) ([place]) 12/29/2020  . Peripheral arterial disease ([place]) 12/20/2020  . History of esophageal stricture 07/08/2020  . Insomnia 12/02/2017  . Chronic cough 08/14/2017  . Poor appetite 08/29/2016  . Peptic stricture of esophagus   . History of colonic polyps   . Hiatal hernia 03/16/2016  . Odynophagia 03/16/2016  . Dysphagia 03/16/2016  . Alcohol abuse 10/26/2015  . Essential hypertension 09/26/2015  . PAC (premature atrial contraction) 10/11/2014  . PVC (premature ventricular contraction) 10/11/2014  . Back muscle spasm 06/15/2014  . Generalized anxiety disorder 06/15/2014  . Smoking 06/15/2014  . Chronic hepatitis C without hepatic coma ([place]) 05/28/2013    Arliss Journey ,PT ,DPT  04/18/2021, 2:29 PM  [facility] [address] [place], [place], [address] Phone: [phone]   Fax:  [phone]  Name: Kara Bailey MRN: [id number] Date of Birth: 1956-07-31

## 2021-04-18 NOTE — Therapy (Signed)
[facility] [address] [place], [place], [address] Phone: [phone]   Fax:  [phone]  Speech Language Pathology Treatment  Patient Details  Name: Kara Bailey MRN: [id number] Date of Birth: [birth year] Referring Provider (SLP): Cipriano Mile, NP   Encounter Date: 04/18/2021   End of Session - 04/18/21 1048    Visit Number 2    Number of Visits 17    Date for SLP Re-Evaluation 06/09/21    Authorization Type [employer]    SLP Start Time 1018    SLP Stop Time  1100    SLP Time Calculation (min) 42 min    Activity Tolerance Patient tolerated treatment well           Past Medical History:  Diagnosis Date  . Anxiety   . Chest pain 08/2014   unspecified  . Cirrhosis ([place])   . Depression   . Dizziness 08/2014  . Dyspnea 08/2014  . Emphysema lung ([place])   . GERD (gastroesophageal reflux disease)   . Heart murmur   . Hepatitis C   . Hiatal hernia   . Hypertension   . PAC (premature atrial contraction) 10/11/2014  . PAD (peripheral artery disease) ([place])   . Palpitations   . PVC (premature ventricular contraction) 10/11/2014  . 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 Stroke [facility])    just slurred speech  . Tachycardia 08/2014  . Ventral hernia   . Weakness 08/2014    Past Surgical History:  Procedure Laterality Date  . APPENDECTOMY    . CATARACT EXTRACTION, BILATERAL    . COLONOSCOPY    . COLONOSCOPY WITH PROPOFOL N/A 04/30/2016   Procedure: COLONOSCOPY WITH PROPOFOL;  Surgeon: Daneil Dolin, MD;  [facility];  Service: Endoscopy;  Laterality: N/A;  1230  . ESOPHAGOGASTRODUODENOSCOPY     approximately 2010  . ESOPHAGOGASTRODUODENOSCOPY (EGD) WITH PROPOFOL N/A 04/30/2016   Procedure: ESOPHAGOGASTRODUODENOSCOPY (EGD) WITH PROPOFOL;  Surgeon: Daneil Dolin, MD;  [facility];  Service: Endoscopy;  Laterality: N/A;  . LEFT HEART CATHETERIZATION WITH CORONARY  ANGIOGRAM N/A 10/29/2014   07-14-20- pt denies this Procedure: LEFT HEART CATHETERIZATION WITH CORONARY ANGIOGRAM;  Surgeon: Burnell Blanks, MD;  Location: [facility] CATH LAB;  Service: Cardiovascular;  Laterality: N/A;  . Venia Minks DILATION N/A 04/30/2016   Procedure: Venia Minks DILATION;  Surgeon: Daneil Dolin, MD;  [facility];  Service: Endoscopy;  Laterality: N/A;  . POLYPECTOMY  04/30/2016   Procedure: POLYPECTOMY;  Surgeon: Daneil Dolin, MD;  [facility];  Service: Endoscopy;;  Sigmoid colon polyp removed via hot snare  . UPPER GASTROINTESTINAL ENDOSCOPY      There were no vitals filed for this visit.   Subjective Assessment - 04/18/21 1016    Subjective "it's been going okay"    Currently in Pain? No/denies                 ADULT SLP TREATMENT - 04/18/21 1016      General Information   Behavior/Cognition Alert;Cooperative;Pleasant mood      Treatment Provided   Treatment provided Cognitive-Linquistic      Cognitive-Linquistic Treatment   Treatment focused on Cognition;Patient/family/caregiver education    Skilled Treatment ST session focused on cognition as pt reports increased difficulty with recall, including medications, etc. SLP educated patient on use of external memory aids to help recall of medication adminstration. SLP assisted patient set alarms in phone for AM/PM medications to prompt  self to take meds. Reduced recall of daily events noted; therefore, SLP introduced use of daily schedule/journal and to-do lists. Pt reported having gone to wrong office this morning, in which SLP highlighted address and suite number on folder to cue patient for next time. Pt reports awareness of errors but experiences difficulty correcting errors (ex: online banking). SLOP introduced this session and to be addressed in upcoming ST sessions to increase speech intelligibility.      Assessment / Recommendations / Plan   Plan Continue with current plan of care       Progression Toward Goals   Progression toward goals Progressing toward goals            SLP Education - 04/18/21 1429    Education Details external memory aids, alarms/timers, daily journal, to do lists, SLOP    Person(s) Educated Patient    Methods Explanation;Demonstration;Handout    Comprehension Verbalized understanding;Returned demonstration;Need further instruction            SLP Short Term Goals - 04/18/21 1049      SLP SHORT TERM GOAL #1   Title Pt will demonstrate dysarthria compensations in 10 minute simple to mod complex conversation with min A over 2 sessions    Time 4    Period Weeks    Status On-going      SLP SHORT TERM GOAL #2   Title Pt will use memory/attention compensations for appointments, medicine management, and other daily activities with occasional min A over 3 sessions.    Time 4    Period Weeks    Status On-going      SLP SHORT TERM GOAL #3   Title Pt will ID and correct word finding errors with 80% accuracy given occasional min A over 2 sessions    Time 4    Period Weeks    Status On-going      SLP SHORT TERM GOAL #4   Title Pt will verbalize 3 safety concerns in the home and generate solutions to them with occasional min A over 2 sessions    Time 4    Period Weeks    Status On-going      SLP SHORT TERM GOAL #5   Title Pt will complete dysphagia HEP to optimize swallow function with occasional min A over 2 sessions    Time 4    Period Weeks    Status On-going            SLP Long Term Goals - 04/18/21 1049      SLP LONG TERM GOAL #1   Title Pt will demonstrate dysarthria compensations in 15 minute simple to mod complex conversation with min A over 2 sessions    Time 8    Period Weeks    Status On-going      SLP LONG TERM GOAL #2   Title Pt will use memory/attention compensations for appointments, medicine management, and other daily activities with rare min A over 3 sessions.    Time 8    Period Weeks    Status On-going       SLP LONG TERM GOAL #3   Title Pt will ID and correct word finding errors with 95% accuracy given occasional min A over 2 sessions    Time 8    Period Weeks    Status On-going      SLP LONG TERM GOAL #4   Title Pt will report improved diet tolerance and reduced s/sx of aspiration with rare min  over 2 sessions    Time 8    Period Weeks    Status On-going      SLP LONG TERM GOAL #5   Title Pt will undergo repeat objective swallow study as needed if s/sx of aspiration persist    Time 8    Period Weeks    Status On-going            Plan - 04/18/21 1429    Clinical Impression Statement Kara Bailey was referred for OPST intervention to address cognitive communication, dysarthria, and swallowing s/p CVA in February 2022. SLP reviewed memory compensations and demonstrated how to implement external memory aids to improve recall of medications and daily events. Dysarthria HEP introduced this session and to be reviewed in upcoming sessions. Skilled ST is recommended to address speech, cognitive communication, and dysphagia impacting pt's overall safety and functional independence compared to baseline.    Speech Therapy Frequency 2x / week    Duration 8 weeks   or 17 visits   Treatment/Interventions Aspiration precaution training;Oral motor exercises;Compensatory strategies;Pharyngeal strengthening exercises;Functional tasks;Patient/family education;Diet toleration management by SLP;Cognitive reorganization;Multimodal communcation approach;Language facilitation;Compensatory techniques;Internal/external aids;SLP instruction and feedback    Potential to Achieve Goals Fair    Potential Considerations Ability to learn/carryover information;Severity of impairments    SLP Home Exercise Plan provided    Consulted and Agree with Plan of Care Patient           Patient will benefit from skilled therapeutic intervention in order to improve the following deficits and impairments:   Cognitive  communication deficit    Problem List Patient Active Problem List   Diagnosis Date Noted  . Acute CVA (cerebrovascular accident) ([place]) 12/29/2020  . Peripheral arterial disease ([place]) 12/20/2020  . History of esophageal stricture 07/08/2020  . Insomnia 12/02/2017  . Chronic cough 08/14/2017  . Poor appetite 08/29/2016  . Peptic stricture of esophagus   . History of colonic polyps   . Hiatal hernia 03/16/2016  . Odynophagia 03/16/2016  . Dysphagia 03/16/2016  . Alcohol abuse 10/26/2015  . Essential hypertension 09/26/2015  . PAC (premature atrial contraction) 10/11/2014  . PVC (premature ventricular contraction) 10/11/2014  . Back muscle spasm 06/15/2014  . Generalized anxiety disorder 06/15/2014  . Smoking 06/15/2014  . Chronic hepatitis C without hepatic coma ([place]) 05/28/2013    Alinda Deem, MA CCC-SLP 04/18/2021, 2:34 PM  [facility] [address] [place], [place], [address] Phone: [phone]   Fax:  [phone]   Name: Kara Bailey MRN: [id number] Date of Birth: [birth year]

## 2021-04-18 NOTE — Therapy (Signed)
[facility] [address] [place], [place], [address] Phone: [phone]   Fax:  [phone]  Occupational Therapy Treatment  Patient Details  Name: Kara Bailey MRN: [id number] Date of Birth: June 16, 1956 Referring Provider (OT): Cipriano Mile, NP   Encounter Date: 04/18/2021   OT End of Session - 04/18/21 1151    Visit Number 2    Number of Visits 9    Date for OT Re-Evaluation 05/26/21   anticipate only 4 weeks   Authorization Type [employer] Medicare/Medicaid    Authorization Time Period Covered 100%    Authorization - Visit Number 1    Authorization - Number of Visits 10    Progress Note Due on Visit 10    OT Start Time 1147    OT Stop Time 1230    OT Time Calculation (min) 43 min    Activity Tolerance Patient tolerated treatment well    Behavior During Therapy [facility] for tasks assessed/performed           Past Medical History:  Diagnosis Date  . Anxiety   . Chest pain 08/2014   unspecified  . Cirrhosis ([place])   . Depression   . Dizziness 08/2014  . Dyspnea 08/2014  . Emphysema lung ([place])   . GERD (gastroesophageal reflux disease)   . Heart murmur   . Hepatitis C   . Hiatal hernia   . Hypertension   . PAC (premature atrial contraction) 10/11/2014  . PAD (peripheral artery disease) ([place])   . Palpitations   . PVC (premature ventricular contraction) 10/11/2014  . 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 Stroke [facility])    just slurred speech  . Tachycardia 08/2014  . Ventral hernia   . Weakness 08/2014    Past Surgical History:  Procedure Laterality Date  . APPENDECTOMY    . CATARACT EXTRACTION, BILATERAL    . COLONOSCOPY    . COLONOSCOPY WITH PROPOFOL N/A 04/30/2016   Procedure: COLONOSCOPY WITH PROPOFOL;  Surgeon: Daneil Dolin, MD;  [facility];  Service: Endoscopy;  Laterality: N/A;  1230  . ESOPHAGOGASTRODUODENOSCOPY     approximately 2010  . ESOPHAGOGASTRODUODENOSCOPY  (EGD) WITH PROPOFOL N/A 04/30/2016   Procedure: ESOPHAGOGASTRODUODENOSCOPY (EGD) WITH PROPOFOL;  Surgeon: Daneil Dolin, MD;  [facility];  Service: Endoscopy;  Laterality: N/A;  . LEFT HEART CATHETERIZATION WITH CORONARY ANGIOGRAM N/A 10/29/2014   07-14-20- pt denies this Procedure: LEFT HEART CATHETERIZATION WITH CORONARY ANGIOGRAM;  Surgeon: Burnell Blanks, MD;  Location: [facility] CATH LAB;  Service: Cardiovascular;  Laterality: N/A;  . Venia Minks DILATION N/A 04/30/2016   Procedure: Venia Minks DILATION;  Surgeon: Daneil Dolin, MD;  [facility];  Service: Endoscopy;  Laterality: N/A;  . POLYPECTOMY  04/30/2016   Procedure: POLYPECTOMY;  Surgeon: Daneil Dolin, MD;  [facility];  Service: Endoscopy;;  Sigmoid colon polyp removed via hot snare  . UPPER GASTROINTESTINAL ENDOSCOPY      There were no vitals filed for this visit.   Subjective Assessment - 04/18/21 1149    Subjective  Pt denies any pain. "I'm just clumsy, I was dropping things"    Pertinent History PMH smoking, PAD, HTN, ETOH abuse, chronic hepatitis C with hepatic coma,GAD, esophageal stricture,  recent episode of possible hematemesis    Limitations Fall Risk    Patient Stated Goals "all of that is ok"    Currently in Pain? No/denies  Coordination Activities  Perform the following activities for 20 minutes 2 times per day with right hand(s).   Rotate ball in fingertips (clockwise and counter-clockwise).  Toss ball between hands.  Toss ball in air and catch with the same hand.  Flip cards 1 at a time as fast as you can.  Deal cards with your thumb (Hold deck in hand and push card off top with thumb).  Rotate card in hand (clockwise and counter-clockwise).  Shuffle cards.  Pick up coins, buttons, marbles, dried beans/pasta of different sizes and place in container.  Pick up coins and place in container or coin bank.  Pick up coins and stack.  Pick up coins one  at a time until you get 5-10 in your hand, then move coins from palm to fingertips to stack one at a time.  Practice writing and/or typing.  Screw together nuts and bolts, then unfasten.    Small Pegs with RUE - copied pattern and used RUE for coordination with small pegs. Pt with min difficulty and min drops.                        OT Long Term Goals - 04/14/21 1054      OT LONG TERM GOAL #1   Title Pt will be independent with HEP for coordination and grip strength    Time 4    Period Weeks    Status New    Target Date 05/26/21   4 weeks anticipated so LTGs only. 6 week POC to allow for any missed visits     OT LONG TERM GOAL #2   Title Pt will improve grip strength, bilaterally, to a functional strength (35lbs or greater).    Baseline R 32.6, L 26.6    Time 4    Period Weeks    Status New      OT LONG TERM GOAL #3   Title Pt will perform physical and cognitive task simultaneously with 90% accuracy    Time 4    Period Weeks    Status New      OT LONG TERM GOAL #4   Title Pt will verbalize understanding of adapted strategies and/or equipment PRN to increase safety and independence with ADLs and IADLs (cutting vegetables, etc)    Time 4    Period Weeks    Status New                 Plan - 04/18/21 1151    Clinical Impression Statement Pt is progressing with goals. Pt agreeable to the goals set in plan of care.    OT Occupational Profile and History Problem Focused Assessment - Including review of records relating to presenting problem    Occupational performance deficits (Please refer to evaluation for details): ADL's;IADL's    Body Structure / Function / Physical Skills ADL;FMC;Coordination;Decreased knowledge of use of DME;Dexterity;GMC;Strength;UE functional use;Vision;IADL    Cognitive Skills Attention;Sequencing;Safety Awareness;Problem Solve    Rehab Potential Good    Clinical Decision Making Limited treatment options, no task  modification necessary    Comorbidities Affecting Occupational Performance: None    Modification or Assistance to Complete Evaluation  No modification of tasks or assist necessary to complete eval    OT Frequency 2x / week    OT Duration 6 weeks   8 visits - anticipate over 4 weeks but may take 6 weeks.   OT Treatment/Interventions Self-care/ADL training;Neuromuscular education;Energy conservation;Functional Mobility Training;Patient/family education;Cognitive remediation/compensation;Therapeutic exercise;Therapeutic  activities;DME and/or AE instruction;Moist Heat;Fluidtherapy;Visual/perceptual remediation/compensation    Plan see how HEP is going, coordination RUE    Recommended Other Services get scheduled for PT (referral in Epic)    Consulted and Agree with Plan of Care Patient           Patient will benefit from skilled therapeutic intervention in order to improve the following deficits and impairments:   Body Structure / Function / Physical Skills: ADL,FMC,Coordination,Decreased knowledge of use of DME,Dexterity,GMC,Strength,UE functional use,Vision,IADL Cognitive Skills: Attention,Sequencing,Safety Awareness,Problem Solve     Visit Diagnosis: Other lack of coordination  Attention and concentration deficit  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 Acute CVA (cerebrovascular accident) ([place]) 12/29/2020  . Peripheral arterial disease ([place]) 12/20/2020  . History of esophageal stricture 07/08/2020  . Insomnia 12/02/2017  . Chronic cough 08/14/2017  . Poor appetite 08/29/2016  . Peptic stricture of esophagus   . History of colonic polyps   . Hiatal hernia 03/16/2016  . Odynophagia 03/16/2016  . Dysphagia 03/16/2016  . Alcohol abuse 10/26/2015  . Essential hypertension 09/26/2015  . PAC (premature atrial  contraction) 10/11/2014  . PVC (premature ventricular contraction) 10/11/2014  . Back muscle spasm 06/15/2014  . Generalized anxiety disorder 06/15/2014  . Smoking 06/15/2014  . Chronic hepatitis C without hepatic coma ([place]) 05/28/2013    Zachery Conch MOT, OTR/L  04/18/2021, 6:48 PM  [facility] [address] [place], [place], [address] Phone: [phone]   Fax:  [phone]  Name: Kara Bailey MRN: [id number] Date of Birth: [birth year]

## 2021-04-18 NOTE — Patient Instructions (Signed)
  Coordination Activities  Perform the following activities for 20 minutes 2 times per day with right hand(s).   Rotate ball in fingertips (clockwise and counter-clockwise).  Toss ball between hands.  Toss ball in air and catch with the same hand.  Flip cards 1 at a time as fast as you can.  Deal cards with your thumb (Hold deck in hand and push card off top with thumb).  Rotate card in hand (clockwise and counter-clockwise).  Shuffle cards.  Pick up coins, buttons, marbles, dried beans/pasta of different sizes and place in container.  Pick up coins and place in container or coin bank.  Pick up coins and stack.  Pick up coins one at a time until you get 5-10 in your hand, then move coins from palm to fingertips to stack one at a time.  Practice writing and/or typing.  Screw together nuts and bolts, then unfasten.

## 2021-04-19 ENCOUNTER — Ambulatory Visit: Payer: Medicare HMO

## 2021-04-19 ENCOUNTER — Ambulatory Visit: Payer: Medicare HMO | Admitting: Occupational Therapy

## 2021-04-19 ENCOUNTER — Encounter: Payer: Self-pay | Admitting: Physical Therapy

## 2021-04-19 ENCOUNTER — Encounter: Payer: Self-pay | Admitting: Cardiovascular Disease

## 2021-04-19 ENCOUNTER — Encounter: Payer: Self-pay | Admitting: Occupational Therapy

## 2021-04-19 ENCOUNTER — Ambulatory Visit (INDEPENDENT_AMBULATORY_CARE_PROVIDER_SITE_OTHER): Payer: Medicare HMO | Admitting: Cardiovascular Disease

## 2021-04-19 ENCOUNTER — Ambulatory Visit: Payer: Medicare HMO | Admitting: Physical Therapy

## 2021-04-19 DIAGNOSIS — R278 Other lack of coordination: Secondary | ICD-10-CM | POA: Diagnosis not present

## 2021-04-19 DIAGNOSIS — R41840 Attention and concentration deficit: Secondary | ICD-10-CM

## 2021-04-19 DIAGNOSIS — I739 Peripheral vascular disease, unspecified: Secondary | ICD-10-CM | POA: Diagnosis not present

## 2021-04-19 DIAGNOSIS — R471 Dysarthria and anarthria: Secondary | ICD-10-CM

## 2021-04-19 DIAGNOSIS — R29818 Other symptoms and signs involving the nervous system: Secondary | ICD-10-CM

## 2021-04-19 DIAGNOSIS — R4701 Aphasia: Secondary | ICD-10-CM

## 2021-04-19 DIAGNOSIS — R41841 Cognitive communication deficit: Secondary | ICD-10-CM

## 2021-04-19 DIAGNOSIS — M6281 Muscle weakness (generalized): Secondary | ICD-10-CM

## 2021-04-19 DIAGNOSIS — R1310 Dysphagia, unspecified: Secondary | ICD-10-CM

## 2021-04-19 DIAGNOSIS — I69851 Hemiplegia and hemiparesis following other cerebrovascular disease affecting right dominant side: Secondary | ICD-10-CM

## 2021-04-19 DIAGNOSIS — R2681 Unsteadiness on feet: Secondary | ICD-10-CM

## 2021-04-19 NOTE — Patient Instructions (Signed)
  Conversational sentences handout

## 2021-04-19 NOTE — Therapy (Signed)
[facility] [address] [place], [place], [address] Phone: [phone]   Fax:  [phone]  Physical Therapy Treatment  Patient Details  Name: Kara Bailey MRN: [id number] Date of Birth: 04-29-1956 Referring Provider (PT): Cipriano Mile, NP   Encounter Date: 04/19/2021   PT End of Session - 04/19/21 2017    Visit Number 2    Number of Visits 17    Date for PT Re-Evaluation 07/17/21   written for 60 day POC   Authorization Type [employer] - requires auth.    PT Start Time 1446    PT Stop Time 1528    PT Time Calculation (min) 42 min    Equipment Utilized During Treatment Gait belt    Activity Tolerance Patient tolerated treatment well    Behavior During Therapy WFL for tasks assessed/performed           Past Medical History:  Diagnosis Date  . Anxiety   . Chest pain 08/2014   unspecified  . Cirrhosis ([place])   . Depression   . Dizziness 08/2014  . Dyspnea 08/2014  . Emphysema lung ([place])   . GERD (gastroesophageal reflux disease)   . Heart murmur   . Hepatitis C   . Hiatal hernia   . Hypertension   . PAC (premature atrial contraction) 10/11/2014  . PAD (peripheral artery disease) ([place])   . Palpitations   . PVC (premature ventricular contraction) 10/11/2014  . 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 Stroke [facility])    just slurred speech  . Tachycardia 08/2014  . Ventral hernia   . Weakness 08/2014    Past Surgical History:  Procedure Laterality Date  . APPENDECTOMY    . CATARACT EXTRACTION, BILATERAL    . COLONOSCOPY    . COLONOSCOPY WITH PROPOFOL N/A 04/30/2016   Procedure: COLONOSCOPY WITH PROPOFOL;  Surgeon: Daneil Dolin, MD;  [facility];  Service: Endoscopy;  Laterality: N/A;  1230  . ESOPHAGOGASTRODUODENOSCOPY     approximately 2010  . ESOPHAGOGASTRODUODENOSCOPY (EGD) WITH PROPOFOL N/A 04/30/2016   Procedure: ESOPHAGOGASTRODUODENOSCOPY (EGD) WITH PROPOFOL;   Surgeon: Daneil Dolin, MD;  [facility];  Service: Endoscopy;  Laterality: N/A;  . LEFT HEART CATHETERIZATION WITH CORONARY ANGIOGRAM N/A 10/29/2014   07-14-20- pt denies this Procedure: LEFT HEART CATHETERIZATION WITH CORONARY ANGIOGRAM;  Surgeon: Burnell Blanks, MD;  Location: [facility] CATH LAB;  Service: Cardiovascular;  Laterality: N/A;  . Venia Minks DILATION N/A 04/30/2016   Procedure: Venia Minks DILATION;  Surgeon: Daneil Dolin, MD;  [facility];  Service: Endoscopy;  Laterality: N/A;  . POLYPECTOMY  04/30/2016   Procedure: POLYPECTOMY;  Surgeon: Daneil Dolin, MD;  [facility];  Service: Endoscopy;;  Sigmoid colon polyp removed via hot snare  . UPPER GASTROINTESTINAL ENDOSCOPY      There were no vitals filed for this visit.   Subjective Assessment - 04/19/21 1448    Subjective Went on a walk around her apartment complex and her legs felt like rubber.    Pertinent History smoking, PAD, HTN, ETOH abuse, chronic hepatitis C with hepatic coma,GAD, esophageal stricture    Limitations Walking;Standing    Patient Stated Goals wants to work on walking normally    Currently in Pain? No/denies              [facility] PT Assessment - 04/19/21 1449      Berg Balance Test   Sit to Stand Able to stand  independently using hands    Standing Unsupported Able to stand safely 2 minutes    Sitting with Back Unsupported but Feet Supported on Floor or Stool Able to sit safely and securely 2 minutes    Stand to Sit Controls descent by using hands    Transfers Able to transfer safely, definite need of hands    Standing Unsupported with Eyes Closed Able to stand 10 seconds with supervision    Standing Unsupported with Feet Together Able to place feet together independently and stand for 1 minute with supervision    From Standing, Reach Forward with Outstretched Arm Can reach confidently >25 cm (10")    From Standing Position, Pick up Object from Floor Unable to pick up  and needs supervision    From Standing Position, Turn to Look Behind Over each Shoulder Looks behind from both sides and weight shifts well    Turn 360 Degrees Able to turn 360 degrees safely but slowly   12.25 to R, 13.21 to L   Standing Unsupported, Alternately Place Feet on Step/Stool Able to stand independently and safely and complete 8 steps in 20 seconds    Standing Unsupported, One Foot in Front Able to plae foot ahead of the other independently and hold 30 seconds    Standing on One Leg Able to lift leg independently and hold equal to or more than 3 seconds   4.1 seconds   Total Score 43    Berg comment: significant fall risk                         [facility] Adult PT Treatment/Exercise - 04/19/21 1524      Transfers   Transfers Sit to Stand;Stand to Sit    Sit to Stand 5: Supervision;With upper extremity assist;From chair/3-in-1    Stand to Sit 5: Supervision;With upper extremity assist;Uncontrolled descent    Comments cues to scoot out towards edge, proper foot placement, and nose over toes to stand, pt still taking incr time to stand with using UE support from chair, performed x2 reps      Ambulation/Gait   Ambulation/Gait Yes    Ambulation/Gait Assistance 5: Supervision    Ambulation Distance (Feet) --   small distances throughout session   Assistive device None    Gait Pattern Decreased arm swing - right;Step-through pattern;Decreased stance time - right;Decreased hip/knee flexion - right;Decreased dorsiflexion - right;Poor foot clearance - right;Lateral trunk lean to right    Ambulation Surface Level;Indoor      Therapeutic Activites    Therapeutic Activities Other Therapeutic Activities    Other Therapeutic Activities working on bed mobilty as pt tries to sit up from supine > long sit and pt reporting difficulty with task at home. Educated and provided cues on log roll technique, with pt reporting incr ease of transfer and getting out of bed, performed x2 reps               Access Code: [facility] URL: [email or web address] Date: 04/19/2021 Prepared by: Janann August  Initiated HEP for BLE strengthening and balance. See MedBridge for more details.   Exercises Supine Bridge - 1 x daily - 5 x weekly - 1 sets - 10 reps Seated Heel Toe Raises - 2 x daily - 5 x weekly - 2 sets - 10 reps Alternating Step Taps with Counter Support - 2 x daily - 5 x weekly - 1 sets - 10 reps Seated Long Arc Quad - 2 x  daily - 5 x weekly - 1-2 sets - 10 reps        PT Education - 04/19/21 2017    Education Details initial HEP    Person(s) Educated Patient    Methods Demonstration;Explanation;Verbal cues;Handout    Comprehension Verbalized understanding;Returned demonstration            PT Short Term Goals - 04/19/21 2025      PT SHORT TERM GOAL #1   Title Pt will be independent with initial HEP in order to build upon functional gains made in therapy. ALL STGS DUE 05/16/21    Baseline currently dependent    Time 4    Period Weeks    Status New    Target Date 05/16/21      PT SHORT TERM GOAL #2   Title Pt will finish BERG assessment to determine fall risk with LTG written    Baseline 43/56 - LTG written    Time 4    Period Weeks    Status Achieved      PT SHORT TERM GOAL #3   Title Pt will decr TUG time to 17 seconds or less with no AD in order to demo decr fall risk.    Baseline 20.47 seconds    Time 4    Period Weeks    Status New      PT SHORT TERM GOAL #4   Title Pt will improve gait speed to at least 2.2 ft/sec with no AD vs. LRAD in order to demo decr fall risk.    Baseline 1.97 ft/sec    Time 4    Period Weeks    Status New      PT SHORT TERM GOAL #5   Title Pt will decr 5x sit <> stand time to 50 seconds or less in order to demo improved BLE strength and balance.    Baseline 64.32 seconds no UE support    Time 4    Period Weeks    Status New             PT Long Term Goals - 04/19/21 2026      PT LONG  TERM GOAL #1   Title Pt will improve BERG score to at least a 48/56 in order to demo decr fall risk. ALL LTGS DUE 06/13/21    Baseline 43/56    Time 8    Period Weeks    Status Revised      PT LONG TERM GOAL #2   Title Pt will go ascend and descend 4 steps with single handrail vs. use of LRAD with supervision in order to be able to go to her son's house.    Baseline can not perform stairs    Time 8    Period Weeks    Status New      PT LONG TERM GOAL #3   Title Pt will decr 5x sit <> stand time to 40 seconds or less in order to demo improved BLE strength and balance.    Baseline 64.32 seconds no UE support    Time 8    Period Weeks    Status New      PT LONG TERM GOAL #4   Title Pt will ambulate at least 300' outdoors over unlevel paved surfaces with LRAD in order to demo improved community mobility.    Baseline not yet assessed.    Time 8    Period Weeks    Status New  PT LONG TERM GOAL #5   Title Pt will decr TUG time to 15 seconds or less with no AD in order to demo decr fall risk.    Baseline 20.47 seconds    Time 8    Period Weeks    Status New      PT LONG TERM GOAL #6   Title Pt will improve FOTO score to at least 60% in order to demo improved functional outcomes.    Baseline 50%    Time 8    Period Weeks    Status New                 Plan - 04/19/21 2024    Clinical Impression Statement Performed the BERG today with pt scoring a 43/56, indicating a significant fall risk. Updated LTG as appropriate. Initiated HEP today for BLE strengthening and standing balance at the countertop. Pt tolerated session well. Began to initiate sit <> stand training with proper technique, with pt taking incr time to come to stand from standard height chair with BUE support. Will continue to progress towards LTGs.    Personal Factors and Comorbidities Comorbidity 3+;Past/Current Experience    Comorbidities smoking, PAD, HTN, ETOH abuse, chronic hepatitis C with hepatic  coma,GAD, esophageal stricture    Examination-Activity Limitations Locomotion Level;Transfers;Stairs;Squat;Carry    Examination-Participation Restrictions Cleaning;Community Activity;Laundry    Stability/Clinical Decision Making Stable/Uncomplicated    Rehab Potential Good    PT Frequency 2x / week    PT Duration 8 weeks    PT Treatment/Interventions ADLs/Self Care Home Management;DME Instruction;Gait training;Stair training;Therapeutic activities;Functional mobility training;Therapeutic exercise;Balance training;Neuromuscular re-education;Orthotic Fit/Training;Patient/family education;Vestibular    PT Next Visit Plan how is HEP? continue to work on R>L strengthening, standing balance (work EC, narrow BOS, SLS tasks), sit <> stand training. try foot up brace for RLE foot clearance esp when more fatigued.    PT Home Exercise Plan [facility]    Consulted and Agree with Plan of Care Patient           Patient will benefit from skilled therapeutic intervention in order to improve the following deficits and impairments:  Abnormal gait,Decreased activity tolerance,Decreased balance,Decreased coordination,Decreased endurance,Decreased knowledge of use of DME,Difficulty walking,Decreased strength,Dizziness,Impaired sensation,Postural dysfunction  Visit Diagnosis: Other symptoms and signs involving the nervous system  Muscle weakness (generalized)  Unsteadiness on feet     Problem List Patient Active Problem List   Diagnosis Date Noted  . Acute CVA (cerebrovascular accident) ([place]) 12/29/2020  . Peripheral arterial disease ([place]) 12/20/2020  . History of esophageal stricture 07/08/2020  . Insomnia 12/02/2017  . Chronic cough 08/14/2017  . Poor appetite 08/29/2016  . Peptic stricture of esophagus   . History of colonic polyps   . Hiatal hernia 03/16/2016  . Odynophagia 03/16/2016  . Dysphagia 03/16/2016  . Alcohol abuse 10/26/2015  . Essential hypertension 09/26/2015  . PAC (premature  atrial contraction) 10/11/2014  . PVC (premature ventricular contraction) 10/11/2014  . Back muscle spasm 06/15/2014  . Generalized anxiety disorder 06/15/2014  . Smoking 06/15/2014  . Chronic hepatitis C without hepatic coma ([place]) 05/28/2013    Arliss Journey , PT, DPT  04/19/2021, 8:28 PM  [facility] [address] [place], [place], [address] Phone: [phone]   Fax:  [phone]  Name: Kara Bailey MRN: [id number] Date of Birth: 04-Apr-1956

## 2021-04-19 NOTE — Assessment & Plan Note (Signed)
Ms. Contino returns for follow-up of PAD.  She is originally sent to me by Dr. Audie Box.  She subsequently had a stroke which she has recovered from except for some dysarthria.  She is getting physical therapy.  She tells me that her right leg is more affected than her left although her Doppler studies revealed a normal right ABI with minimally obstructive disease and a left ABI of 0.89 with mild to moderate disease in her mid left SFA.  She is minimally ambulatory for the most part stays in her house.  She does continue to smoke.  At this point, I do not feel compelled to pursue an invasive approach.

## 2021-04-19 NOTE — Therapy (Signed)
[facility] [address] [place], [place], [address] Phone: [phone]   Fax:  [phone]  Occupational Therapy Treatment  Patient Details  Name: Kara Bailey MRN: [id number] Date of Birth: [birth year] Referring Provider (OT): Cipriano Mile, NP   Encounter Date: 04/19/2021   OT End of Session - 04/19/21 1532    Visit Number 3    Number of Visits 9    Date for OT Re-Evaluation 05/26/21   anticipate only 4 weeks   Authorization Type [employer] Medicare/Medicaid    Authorization Time Period Covered 100%    Authorization - Visit Number 2    Authorization - Number of Visits 10    Progress Note Due on Visit 10    OT Start Time 1531    OT Stop Time 1615    OT Time Calculation (min) 44 min    Activity Tolerance Patient tolerated treatment well    Behavior During Therapy [facility] for tasks assessed/performed           Past Medical History:  Diagnosis Date  . Anxiety   . Chest pain 08/2014   unspecified  . Cirrhosis ([place])   . Depression   . Dizziness 08/2014  . Dyspnea 08/2014  . Emphysema lung ([place])   . GERD (gastroesophageal reflux disease)   . Heart murmur   . Hepatitis C   . Hiatal hernia   . Hypertension   . PAC (premature atrial contraction) 10/11/2014  . PAD (peripheral artery disease) ([place])   . Palpitations   . PVC (premature ventricular contraction) 10/11/2014  . 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 Stroke [facility])    just slurred speech  . Tachycardia 08/2014  . Ventral hernia   . Weakness 08/2014    Past Surgical History:  Procedure Laterality Date  . APPENDECTOMY    . CATARACT EXTRACTION, BILATERAL    . COLONOSCOPY    . COLONOSCOPY WITH PROPOFOL N/A 04/30/2016   Procedure: COLONOSCOPY WITH PROPOFOL;  Surgeon: Daneil Dolin, MD;  [facility];  Service: Endoscopy;  Laterality: N/A;  1230  . ESOPHAGOGASTRODUODENOSCOPY     approximately 2010  . ESOPHAGOGASTRODUODENOSCOPY  (EGD) WITH PROPOFOL N/A 04/30/2016   Procedure: ESOPHAGOGASTRODUODENOSCOPY (EGD) WITH PROPOFOL;  Surgeon: Daneil Dolin, MD;  [facility];  Service: Endoscopy;  Laterality: N/A;  . LEFT HEART CATHETERIZATION WITH CORONARY ANGIOGRAM N/A 10/29/2014   07-14-20- pt denies this Procedure: LEFT HEART CATHETERIZATION WITH CORONARY ANGIOGRAM;  Surgeon: Burnell Blanks, MD;  Location: [facility] CATH LAB;  Service: Cardiovascular;  Laterality: N/A;  . Venia Minks DILATION N/A 04/30/2016   Procedure: Venia Minks DILATION;  Surgeon: Daneil Dolin, MD;  [facility];  Service: Endoscopy;  Laterality: N/A;  . POLYPECTOMY  04/30/2016   Procedure: POLYPECTOMY;  Surgeon: Daneil Dolin, MD;  [facility];  Service: Endoscopy;;  Sigmoid colon polyp removed via hot snare  . UPPER GASTROINTESTINAL ENDOSCOPY      There were no vitals filed for this visit.   Subjective Assessment - 04/19/21 1532    Subjective  Pt denies any pain. Pt reports no changes.    Pertinent History PMH smoking, PAD, HTN, ETOH abuse, chronic hepatitis C with hepatic coma,GAD, esophageal stricture,  recent episode of possible hematemesis    Limitations Fall Risk    Patient Stated Goals "all of that is ok"    Currently in Pain? No/denies  Grooved Pegs RUE with min difficulty and drops.   Red Theraput[employer] BUE Access Code: [email or web address] URL: [email or web address] Date: 04/19/2021 Prepared by: Waldo Laine  Exercises Putty Squeezes - 1 x daily - 7 x weekly - 3 sets - 10 reps Rolling Putty on Table - 1 x daily - 7 x weekly - 3 sets - 10 reps Thumb Opposition with Putty - 1 x daily - 7 x weekly - 3 sets - 10 reps Finger Extension with Putty - 1 x daily - 7 x weekly - 3 sets - 10 reps Finger Lumbricals with Putty - 1 x daily - 7 x weekly - 3 sets - 10 reps   ADLs practiced cutting with red theraputty ad butter knife for coordination and strength. Pt reports no difficulty with cutting  vegetables at home anymore.  Stringing Beads for coordination, functional use and bimanual coordination            OT Education - 04/19/21 1544    Education Details [employer] Access Code: [email or web address]. Coordination HEP (last session)    Person(s) Educated Patient    Methods Explanation;Demonstration    Comprehension Verbalized understanding;Returned demonstration               OT Long Term Goals - 04/19/21 1544      OT LONG TERM GOAL #1   Title Pt will be independent with HEP for coordination and grip strength    Time 4    Period Weeks    Status On-going      OT LONG TERM GOAL #2   Title Pt will improve grip strength, bilaterally, to a functional strength (35lbs or greater).    Baseline R 32.6, L 26.6    Time 4    Period Weeks    Status On-going      OT LONG TERM GOAL #3   Title Pt will perform physical and cognitive task simultaneously with 90% accuracy    Time 4    Period Weeks    Status New      OT LONG TERM GOAL #4   Title Pt will verbalize understanding of adapted strategies and/or equipment PRN to increase safety and independence with ADLs and IADLs (cutting vegetables, etc)    Time 4    Period Weeks    Status New                 Plan - 04/19/21 1541    Clinical Impression Statement Pt is progressing with goals. Pt agreeable to the goals set in plan of care.    OT Occupational Profile and History Problem Focused Assessment - Including review of records relating to presenting problem    Occupational performance deficits (Please refer to evaluation for details): ADL's;IADL's    Body Structure / Function / Physical Skills ADL;FMC;Coordination;Decreased knowledge of use of DME;Dexterity;GMC;Strength;UE functional use;Vision;IADL    Cognitive Skills Attention;Sequencing;Safety Awareness;Problem Solve    Rehab Potential Good    Clinical Decision Making Limited treatment options, no task modification necessary    Comorbidities Affecting  Occupational Performance: None    Modification or Assistance to Complete Evaluation  No modification of tasks or assist necessary to complete eval    OT Frequency 2x / week    OT Duration 6 weeks   8 visits - anticipate over 4 weeks but may take 6 weeks.   OT Treatment/Interventions Self-care/ADL training;Neuromuscular education;Energy conservation;Functional Mobility Training;Patient/family education;Cognitive remediation/compensation;Therapeutic exercise;Therapeutic activities;DME and/or AE instruction;Moist Heat;Fluidtherapy;Visual/perceptual remediation/compensation    Plan RUE coordination activities  Recommended Other Services get scheduled for PT (referral in Epic)    Consulted and Agree with Plan of Care Patient           Patient will benefit from skilled therapeutic intervention in order to improve the following deficits and impairments:   Body Structure / Function / Physical Skills: ADL,FMC,Coordination,Decreased knowledge of use of DME,Dexterity,GMC,Strength,UE functional use,Vision,IADL Cognitive Skills: Attention,Sequencing,Safety Awareness,Problem Solve     Visit Diagnosis: 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 Acute CVA (cerebrovascular accident) ([place]) 12/29/2020  . Peripheral arterial disease ([place]) 12/20/2020  . History of esophageal stricture 07/08/2020  . Insomnia 12/02/2017  . Chronic cough 08/14/2017  . Poor appetite 08/29/2016  . Peptic stricture of esophagus   . History of colonic polyps   . Hiatal hernia 03/16/2016  . Odynophagia 03/16/2016  . Dysphagia 03/16/2016  . Alcohol abuse 10/26/2015  . Essential hypertension 09/26/2015  . PAC  (premature atrial contraction) 10/11/2014  . PVC (premature ventricular contraction) 10/11/2014  . Back muscle spasm 06/15/2014  . Generalized anxiety disorder 06/15/2014  . Smoking 06/15/2014  . Chronic hepatitis C without hepatic coma ([place]) 05/28/2013    Zachery Conch MOT, OTR/L  04/19/2021, 3:46 PM  [facility] [address] [place], [place], [address] Phone: [phone]   Fax:  [phone]  Name: Kara Bailey MRN: [id number] Date of Birth: 11-24-1955

## 2021-04-19 NOTE — Therapy (Signed)
[facility] [address] [place], [place], [address] Phone: [phone]   Fax:  [phone]  Speech Language Pathology Treatment  Patient Details  Name: Kara Bailey MRN: [id number] Date of Birth: 1956-02-13 Referring Provider (SLP): Cipriano Mile, NP   Encounter Date: 04/19/2021   End of Session - 04/19/21 1556    Visit Number 3    Number of Visits 17    Date for SLP Re-Evaluation 06/09/21    Authorization Type [employer]    SLP Start Time 5102    SLP Stop Time  1445    SLP Time Calculation (min) 42 min    Activity Tolerance Patient tolerated treatment well           Past Medical History:  Diagnosis Date  . Anxiety   . Chest pain 08/2014   unspecified  . Cirrhosis ([place])   . Depression   . Dizziness 08/2014  . Dyspnea 08/2014  . Emphysema lung ([place])   . GERD (gastroesophageal reflux disease)   . Heart murmur   . Hepatitis C   . Hiatal hernia   . Hypertension   . PAC (premature atrial contraction) 10/11/2014  . PAD (peripheral artery disease) ([place])   . Palpitations   . PVC (premature ventricular contraction) 10/11/2014  . 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 Stroke [facility])    just slurred speech  . Tachycardia 08/2014  . Ventral hernia   . Weakness 08/2014    Past Surgical History:  Procedure Laterality Date  . APPENDECTOMY    . CATARACT EXTRACTION, BILATERAL    . COLONOSCOPY    . COLONOSCOPY WITH PROPOFOL N/A 04/30/2016   Procedure: COLONOSCOPY WITH PROPOFOL;  Surgeon: Daneil Dolin, MD;  Location: [facility];  Service: Endoscopy;  Laterality: N/A;  1230  . ESOPHAGOGASTRODUODENOSCOPY     approximately 2010  . ESOPHAGOGASTRODUODENOSCOPY (EGD) WITH PROPOFOL N/A 04/30/2016   Procedure: ESOPHAGOGASTRODUODENOSCOPY (EGD) WITH PROPOFOL;  Surgeon: Daneil Dolin, MD;  [facility];  Service: Endoscopy;  Laterality: N/A;  . LEFT HEART CATHETERIZATION WITH CORONARY  ANGIOGRAM N/A 10/29/2014   07-14-20- pt denies this Procedure: LEFT HEART CATHETERIZATION WITH CORONARY ANGIOGRAM;  Surgeon: Burnell Blanks, MD;  Location: [facility] CATH LAB;  Service: Cardiovascular;  Laterality: N/A;  . Venia Minks DILATION N/A 04/30/2016   Procedure: Venia Minks DILATION;  Surgeon: Daneil Dolin, MD;  [facility];  Service: Endoscopy;  Laterality: N/A;  . POLYPECTOMY  04/30/2016   Procedure: POLYPECTOMY;  Surgeon: Daneil Dolin, MD;  Location: [facility];  Service: Endoscopy;;  Sigmoid colon polyp removed via hot snare  . UPPER GASTROINTESTINAL ENDOSCOPY      There were no vitals filed for this visit.   Subjective Assessment - 04/19/21 1414    Subjective Pt arrived with her folder and req'd extra time to find HEP and memory compensations.    Currently in Pain? No/denies                 ADULT SLP TREATMENT - 04/19/21 1415      General Information   Behavior/Cognition Alert;Cooperative;Pleasant mood      Treatment Provided   Treatment provided Cognitive-Linquistic      Cognitive-Linquistic Treatment   Treatment focused on Dysarthria;Patient/family/caregiver education    Skilled Treatment Pt showed SLP her weekly schedule in her folder. SLP re-introduced SLOP speech compensations for pt today; pt did not recall being introduced to them yesterday. However, in reading conversational sentnece  practice with these compensations pt's speech appeared more apraxic in nature so SLP worked with pt with lighter contacts and brushing articulation ("just let it roll out of your mouth"), and SLP provided examples with the conversational sentences. Pt's success using this technique was 90% imitation of SLP in conversational sentences and 75% spontaneously with reading conversational setnences. Pt was told to practice the gentle speaking and "letting it roll" reading conversational sentences.      Assessment / Recommendations / Plan   Plan Continue with current plan of  care      Progression Toward Goals   Progression toward goals Progressing toward goals            SLP Education - 04/19/21 1555    Education Details gentle contacts, speech compensations    Person(s) Educated Patient    Methods Explanation;Demonstration;Verbal cues    Comprehension Verbalized understanding;Returned demonstration;Verbal cues required;Need further instruction            SLP Short Term Goals - 04/19/21 1559      SLP SHORT TERM GOAL #1   Title Pt will demonstrate dysarthria compensations in 10 minute simple to mod complex conversation with min A over 2 sessions    Time 4    Period Weeks    Status On-going      SLP SHORT TERM GOAL #2   Title Pt will use memory/attention compensations for appointments, medicine management, and other daily activities with occasional min A over 3 sessions.    Time 4    Period Weeks    Status On-going      SLP SHORT TERM GOAL #3   Title Pt will ID and correct word finding errors with 80% accuracy given occasional min A over 2 sessions    Time 4    Period Weeks    Status On-going      SLP SHORT TERM GOAL #4   Title Pt will verbalize 3 safety concerns in the home and generate solutions to them with occasional min A over 2 sessions    Time 4    Period Weeks    Status On-going      SLP SHORT TERM GOAL #5   Title Pt will complete dysphagia HEP to optimize swallow function with occasional min A over 2 sessions    Time 4    Period Weeks    Status On-going            SLP Long Term Goals - 04/19/21 1559      SLP LONG TERM GOAL #1   Title Pt will demonstrate dysarthria compensations in 15 minute simple to mod complex conversation with min A over 2 sessions    Time 8    Period Weeks    Status On-going      SLP LONG TERM GOAL #2   Title Pt will use memory/attention compensations for appointments, medicine management, and other daily activities with rare min A over 3 sessions.    Time 8    Period Weeks    Status On-going       SLP LONG TERM GOAL #3   Title Pt will ID and correct word finding errors with 95% accuracy given occasional min A over 2 sessions    Time 8    Period Weeks    Status On-going      SLP LONG TERM GOAL #4   Title Pt will report improved diet tolerance and reduced s/sx of aspiration with rare min over 2 sessions  Time 8    Period Weeks    Status On-going      SLP LONG TERM GOAL #5   Title Pt will undergo repeat objective swallow study as needed if s/sx of aspiration persist    Time 8    Period Weeks    Status On-going            Plan - 04/19/21 1557    Clinical Impression Statement Kara Bailey cont to present with cognitive communication, dysarthria, and swallowing s/p CVA in February 2022. SLP reviewed speech compensations (slow, loud, over enunciate, pause) and also "letting it roll out of your mouth" to foster lighter more gentle speech due to sx of dysfluency.Dysarthria HEP was added with conversational sentences focusing on gentle speech. Skilled ST is cont'd recommended to address speech, cognitive communication, and dysphagia impacting pt's overall safety and functional independence compared to baseline.    Speech Therapy Frequency 2x / week    Duration 8 weeks   or 17 visits   Treatment/Interventions Aspiration precaution training;Oral motor exercises;Compensatory strategies;Pharyngeal strengthening exercises;Functional tasks;Patient/family education;Diet toleration management by SLP;Cognitive reorganization;Multimodal communcation approach;Language facilitation;Compensatory techniques;Internal/external aids;SLP instruction and feedback    Potential to Achieve Goals Fair    Potential Considerations Ability to learn/carryover information;Severity of impairments    SLP Home Exercise Plan provided    Consulted and Agree with Plan of Care Patient           Patient will benefit from skilled therapeutic intervention in order to improve the following deficits and impairments:    Dysarthria and anarthria  Cognitive communication deficit  Aphasia  Dysphagia, unspecified type    Problem List Patient Active Problem List   Diagnosis Date Noted  . Acute CVA (cerebrovascular accident) ([place]) 12/29/2020  . Peripheral arterial disease ([place]) 12/20/2020  . History of esophageal stricture 07/08/2020  . Insomnia 12/02/2017  . Chronic cough 08/14/2017  . Poor appetite 08/29/2016  . Peptic stricture of esophagus   . History of colonic polyps   . Hiatal hernia 03/16/2016  . Odynophagia 03/16/2016  . Dysphagia 03/16/2016  . Alcohol abuse 10/26/2015  . Essential hypertension 09/26/2015  . PAC (premature atrial contraction) 10/11/2014  . PVC (premature ventricular contraction) 10/11/2014  . Back muscle spasm 06/15/2014  . Generalized anxiety disorder 06/15/2014  . Smoking 06/15/2014  . Chronic hepatitis C without hepatic coma ([place]) 05/28/2013    Dalis Beers ,[place], [place]  04/19/2021, 3:59 PM  [facility] [address] [place], [place], [address] Phone: [phone]   Fax:  [phone]   Name: Kara Bailey MRN: [id number] Date of Birth: August 11, 1956

## 2021-04-19 NOTE — Progress Notes (Signed)
04/19/2021 [name]   [birth year]  [id number]  Primary Physician Andree Moro, DO Primary Cardiologist: Lorretta Harp MD [name], [place]  HPI:  Kara Bailey is a 65 y.o.  mildly overweight divorced African-American female mother of 2, grandmother of 43 grandchildren referred by Dr. Audie Box, her cardiologist, for peripheral vascular evaluation.  She is retired from doing factory work.  I last saw her in the office 01/20/2021. Risk factors include smoking up to 1/2 pack a day for the last 50 years and treated hypertension.  There is no family history.  She is never had heart attack or stroke.  Denies chest pain or shortness of breath.  She complains of left calf lifestyle of any claudication which she has had for several years.  Recent Doppler studies performed 11/03/2020 revealed a right ABI 1.0 and a left of 0.89 with moderate disease noted in the mid left SFA  She had suffered a stroke when I saw her last and her peripheral vascular work-up was put on hold.  Her neurologic deficits have since resolved except for dysarthria and she is participating physical therapy.  She does continue to smoke however.  She tells me that her right leg is "tighter than her left but really denies claudication type symptoms.  Interestingly, her right ABI is normal on her left is 0.89.  She has no obstructive disease for the most part on the right side with mild to moderate disease in her left SFA.   Current Meds  Medication Sig  . albuterol (VENTOLIN HFA) 108 (90 Base) MCG/ACT inhaler Inhale 2 puffs into the lungs every 6 (six) hours as needed for shortness of breath or wheezing.  Marland Kitchen aspirin EC 81 MG EC tablet Take 1 tablet (81 mg total) by mouth daily. Swallow whole. (Patient taking differently: Take 81 mg by mouth in the morning. Swallow whole.)  . atorvastatin (LIPITOR) 80 MG tablet Take 1 tablet (80 mg total) by mouth daily. (Patient taking differently: Take 80 mg by mouth at bedtime.)  .  busPIRone (BUSPAR) 15 MG tablet Take 1 tablet (15 mg total) by mouth 2 (two) times daily.  . cyclobenzaprine (FLEXERIL) 10 MG tablet Take 10 mg by mouth daily as needed for muscle spasms (Back pain).  Marland Kitchen diltiazem (TIAZAC) 180 MG 24 hr capsule Take 180 mg by mouth in the morning.  . escitalopram (LEXAPRO) 20 MG tablet Take 20 mg by mouth in the morning.  . famotidine (PEPCID) 40 MG tablet Take 40 mg by mouth at bedtime.  . hydroxypropyl methylcellulose / hypromellose (ISOPTO TEARS / GONIOVISC) 2.5 % ophthalmic solution Place 1 drop into both eyes 2 (two) times daily as needed for dry eyes.  . mirtazapine (REMERON) 30 MG tablet Take 0.5 tablets (15 mg total) by mouth at bedtime. (Patient taking differently: Take 30 mg by mouth at bedtime.)  . Multiple Vitamin (MULTIVITAMIN WITH MINERALS) TABS tablet Take 1 tablet by mouth daily.  . nicotine (NICODERM CQ - DOSED IN MG/24 HOURS) 21 mg/24hr patch Place 1 patch (21 mg total) onto the skin daily.  . pantoprazole (PROTONIX) 40 MG tablet Take 1 tablet (40 mg total) by mouth daily. (Patient taking differently: Take 40 mg by mouth in the morning.)  . thiamine 100 MG tablet Take 1 tablet (100 mg total) by mouth daily. (Patient taking differently: Take 100 mg by mouth in the morning.)   Current Facility-Administered Medications for the 04/19/21 encounter (Office Visit) with Lorretta Harp, MD  Medication  . sodium chloride flush (NS) 0.9 % injection 3 mL     No Known Allergies  Social History   Socioeconomic History  . Marital status: Divorced    Spouse name: Not on file  . Number of children: 2  . Years of education: Not on file  . Highest education level: Not on file  Occupational History  . Occupation: retired  Tobacco Use  . Smoking status: Current Every Day Smoker    Packs/day: 0.50    Years: 40.00    Pack years: 20.00    Types: Cigarettes    Start date: 11/12/1968  . Smokeless tobacco: Never Used  . Tobacco comment: cutting back, wearing  patches  Vaping Use  . Vaping Use: Never used  Substance and Sexual Activity  . Alcohol use: Yes    Alcohol/week: 0.0 standard drinks    Comment: beer on weekends (6-pack)  . Drug use: No  . Sexual activity: Not Currently  Other Topics Concern  . Not on file  Social History Narrative  . Not on file   Social Determinants of Health   Financial Resource Strain: Not on file  Food Insecurity: Not on file  Transportation Needs: Not on file  Physical Activity: Not on file  Stress: Not on file  Social Connections: Not on file  Intimate Partner Violence: Not on file     Review of Systems: General: negative for chills, fever, night sweats or weight changes.  Cardiovascular: negative for chest pain, dyspnea on exertion, edema, orthopnea, palpitations, paroxysmal nocturnal dyspnea or shortness of breath Dermatological: negative for rash Respiratory: negative for cough or wheezing Urologic: negative for hematuria Abdominal: negative for nausea, vomiting, diarrhea, bright red blood per rectum, melena, or hematemesis Neurologic: negative for visual changes, syncope, or dizziness All other systems reviewed and are otherwise negative except as noted above.    Blood pressure 138/76, pulse 88, height 5\' 6"  (1.676 m), weight 152 lb (68.9 kg).  General appearance: alert and no distress Neck: no adenopathy, no carotid bruit, no JVD, supple, symmetrical, trachea midline and thyroid not enlarged, symmetric, no tenderness/mass/nodules Lungs: clear to auscultation bilaterally Heart: regular rate and rhythm, S1, S2 normal, no murmur, click, rub or gallop Extremities: extremities normal, atraumatic, no cyanosis or edema Pulses: 2+ and symmetric Skin: Skin color, texture, turgor normal. No rashes or lesions Neurologic: Alert and oriented X 3, normal strength and tone. Normal symmetric reflexes. Normal coordination and gait  EKG not performed today  ASSESSMENT AND PLAN:   Peripheral arterial  disease ([place]) [name] returns for follow-up of PAD.  She is originally sent to me by Dr. Audie Box.  She subsequently had a stroke which she has recovered from except for some dysarthria.  She is getting physical therapy.  She tells me that her right leg is more affected than her left although her Doppler studies revealed a normal right ABI with minimally obstructive disease and a left ABI of 0.89 with mild to moderate disease in her mid left SFA.  She is minimally ambulatory for the most part stays in her house.  She does continue to smoke.  At this point, I do not feel compelled to pursue an invasive approach.      Lorretta Harp MD FACP,FACC,FAHA, [facility] 04/19/2021 10:32 AM

## 2021-04-19 NOTE — Patient Instructions (Signed)
Medication Instructions:  Your physician recommends that you continue on your current medications as directed. Please refer to the Current Medication list given to you today.  *If you need a refill on your cardiac medications before your next appointment, please call your pharmacy*   Follow-Up: At [facility], you and your health needs are our priority.  As part of our continuing mission to provide you with exceptional heart care, we have created designated Provider Care Teams.  These Care Teams include your primary Cardiologist (physician) and Advanced Practice Providers (APPs -  Physician Assistants and Nurse Practitioners) who all work together to provide you with the care you need, when you need it.  We recommend signing up for the patient portal called "MyChart".  Sign up information is provided on this After Visit Summary.  MyChart is used to connect with patients for Virtual Visits (Telemedicine).  Patients are able to view lab/test results, encounter notes, upcoming appointments, etc.  Non-urgent messages can be sent to your provider as well.   To learn more about what you can do with MyChart, go to [email or web address].    Your next appointment:   AS NEEDED with Dr. Berry 

## 2021-04-25 ENCOUNTER — Ambulatory Visit: Payer: Medicare HMO | Admitting: Physical Therapy

## 2021-04-25 ENCOUNTER — Ambulatory Visit: Payer: Medicare HMO | Admitting: Occupational Therapy

## 2021-04-25 ENCOUNTER — Encounter: Payer: Self-pay | Admitting: Occupational Therapy

## 2021-04-25 ENCOUNTER — Other Ambulatory Visit: Payer: Self-pay

## 2021-04-25 VITALS — BP 154/86 | HR 98

## 2021-04-25 DIAGNOSIS — M6281 Muscle weakness (generalized): Secondary | ICD-10-CM

## 2021-04-25 DIAGNOSIS — R278 Other lack of coordination: Secondary | ICD-10-CM | POA: Diagnosis not present

## 2021-04-25 DIAGNOSIS — I69851 Hemiplegia and hemiparesis following other cerebrovascular disease affecting right dominant side: Secondary | ICD-10-CM

## 2021-04-25 DIAGNOSIS — R41840 Attention and concentration deficit: Secondary | ICD-10-CM

## 2021-04-25 DIAGNOSIS — R2681 Unsteadiness on feet: Secondary | ICD-10-CM

## 2021-04-25 NOTE — Therapy (Signed)
[facility] [address] [place], [place], [address] Phone: [phone]   Fax:  [phone]  Physical Therapy Treatment  Patient Details  Name: Kara Bailey MRN: [id number] Date of Birth: Apr 09, 1956 Referring Provider (PT): Cipriano Mile, NP   Encounter Date: 04/25/2021   PT End of Session - 04/25/21 1024     Visit Number 3    Number of Visits 17    Date for PT Re-Evaluation 07/17/21   written for 60 day POC   Authorization Type [employer] - requires auth.    PT Start Time 1021    PT Stop Time 1100    PT Time Calculation (min) 39 min    Equipment Utilized During Treatment Gait belt    Activity Tolerance Patient tolerated treatment well    Behavior During Therapy WFL for tasks assessed/performed             Past Medical History:  Diagnosis Date   Anxiety    Chest pain 08/2014   unspecified   Cirrhosis ([place])    Depression    Dizziness 08/2014   Dyspnea 08/2014   Emphysema lung (HCC)    GERD (gastroesophageal reflux disease)    Heart murmur    Hepatitis C    Hiatal hernia    Hypertension    PAC (premature atrial contraction) 10/11/2014   PAD (peripheral artery disease) (HCC)    Palpitations    PVC (premature ventricular contraction) 10/11/2014   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   Stroke ([place])    just slurred speech   Tachycardia 08/2014   Ventral hernia    Weakness 08/2014    Past Surgical History:  Procedure Laterality Date   APPENDECTOMY     CATARACT EXTRACTION, BILATERAL     COLONOSCOPY     COLONOSCOPY WITH PROPOFOL N/A 04/30/2016   Procedure: COLONOSCOPY WITH PROPOFOL;  Surgeon: Daneil Dolin, MD;  [facility];  Service: Endoscopy;  Laterality: N/A;  1230   ESOPHAGOGASTRODUODENOSCOPY     approximately 2010   ESOPHAGOGASTRODUODENOSCOPY (EGD) WITH PROPOFOL N/A 04/30/2016   Procedure: ESOPHAGOGASTRODUODENOSCOPY (EGD) WITH PROPOFOL;  Surgeon: Daneil Dolin, MD;  [facility];  Service: Endoscopy;  Laterality: N/A;   LEFT HEART CATHETERIZATION WITH CORONARY ANGIOGRAM N/A 10/29/2014   07-14-20- pt denies this Procedure: LEFT HEART CATHETERIZATION WITH CORONARY ANGIOGRAM;  Surgeon: Burnell Blanks, MD;  Location: [facility] CATH LAB;  Service: Cardiovascular;  Laterality: N/A;   MALONEY DILATION N/A 04/30/2016   Procedure: Venia Minks DILATION;  Surgeon: Daneil Dolin, MD;  [facility];  Service: Endoscopy;  Laterality: N/A;   POLYPECTOMY  04/30/2016   Procedure: POLYPECTOMY;  Surgeon: Daneil Dolin, MD;  [facility];  Service: Endoscopy;;  Sigmoid colon polyp removed via hot snare   UPPER GASTROINTESTINAL ENDOSCOPY      Vitals:   04/25/21 1033  BP: (!) 154/86  Pulse: 98     Subjective Assessment - 04/25/21 1023     Subjective No changes, worked on exercises.  I've learned alot with them.    Pertinent History smoking, PAD, HTN, ETOH abuse, chronic hepatitis C with hepatic coma,GAD, esophageal stricture    Limitations Walking;Standing    Patient Stated Goals wants to work on walking normally    Currently in Pain? No/denies  [place] Adult PT Treatment/Exercise - 04/25/21 0001       Transfers   Transfers Sit to Stand;Stand to Sit    Sit to Stand 5: Supervision;With upper extremity assist;From chair/3-in-1    Stand to Sit 5: Supervision;With upper extremity assist;Uncontrolled descent    Comments cues to scoot out towards edge, proper foot placement, and nose over toes to stand, and slow descent into sitting; pt still taking incr time to stand with using UE support from chair, performed x5 reps      Exercises   Exercises Other Exercises    Other Exercises  Standing at counter:  minisquats x 5 reps, 2 sets.  Side step taps x 10 reps, BLE; marching in place x 10; standing hip extension step taps x 10 reps.            Access Code: [facility] URL:  [email or web address] Date: 04/25/2021 Prepared by: Mady Haagensen  Exercises-Reviewed HEP from last visit.  Pt return demo with minimal cues:  Supine Bridge - 1 x daily - 5 x weekly - 1 sets - 10 reps Seated Heel Toe Raises - 2 x daily - 5 x weekly - 2 sets - 10 reps Alternating Step Taps with Counter Support - 2 x daily - 5 x weekly - 1 sets - 10 reps Seated Long Arc Quad - 2 x daily - 5 x weekly - 1-2 sets - 10 reps  Also performed hooklying march, 10 reps Seated marching, 2 sets x 5 reps Seated sidestep out and and, 2 sets x 5 reps Seated hip adductor pillow squeezes, 10 reps     Balance Exercises - 04/25/21 0001       Balance Exercises: Standing   Standing Eyes Opened Narrow base of support (BOS);Solid surface;Head turns;5 reps;Limitations    Standing Eyes Opened Limitations Head turns, head nods.  Pt with instability through knees with head motions activity.    Tandem Stance Eyes open;Upper extremity support 2;2 reps;30 secs    SLS Eyes open;Solid surface;Upper extremity support 2;10 secs;1 rep    Retro Gait 2 reps;Limitations    Retro Gait Limitations Forward/back along counter, cues for increased step length    Sidestepping 2 reps;Upper extremity support;Limitations    Sidestepping Limitations R and L along counter, cues to keep hips facing counter and avoid hip external rotation.                 PT Short Term Goals - 04/19/21 2025       PT SHORT TERM GOAL #1   Title Pt will be independent with initial HEP in order to build upon functional gains made in therapy. ALL STGS DUE 05/16/21    Baseline currently dependent    Time 4    Period Weeks    Status New    Target Date 05/16/21      PT SHORT TERM GOAL #2   Title Pt will finish BERG assessment to determine fall risk with LTG written    Baseline 43/56 - LTG written    Time 4    Period Weeks    Status Achieved      PT SHORT TERM GOAL #3   Title Pt will decr TUG time to 17 seconds or less with  no AD in order to demo decr fall risk.    Baseline 20.47 seconds    Time 4    Period Weeks    Status New      PT SHORT TERM GOAL #4  Title Pt will improve gait speed to at least 2.2 ft/sec with no AD vs. LRAD in order to demo decr fall risk.    Baseline 1.97 ft/sec    Time 4    Period Weeks    Status New      PT SHORT TERM GOAL #5   Title Pt will decr 5x sit <> stand time to 50 seconds or less in order to demo improved BLE strength and balance.    Baseline 64.32 seconds no UE support    Time 4    Period Weeks    Status New               PT Long Term Goals - 04/19/21 2026       PT LONG TERM GOAL #1   Title Pt will improve BERG score to at least a 48/56 in order to demo decr fall risk. ALL LTGS DUE 06/13/21    Baseline 43/56    Time 8    Period Weeks    Status Revised      PT LONG TERM GOAL #2   Title Pt will go ascend and descend 4 steps with single handrail vs. use of LRAD with supervision in order to be able to go to her son's house.    Baseline can not perform stairs    Time 8    Period Weeks    Status New      PT LONG TERM GOAL #3   Title Pt will decr 5x sit <> stand time to 40 seconds or less in order to demo improved BLE strength and balance.    Baseline 64.32 seconds no UE support    Time 8    Period Weeks    Status New      PT LONG TERM GOAL #4   Title Pt will ambulate at least 300' outdoors over unlevel paved surfaces with LRAD in order to demo improved community mobility.    Baseline not yet assessed.    Time 8    Period Weeks    Status New      PT LONG TERM GOAL #5   Title Pt will decr TUG time to 15 seconds or less with no AD in order to demo decr fall risk.    Baseline 20.47 seconds    Time 8    Period Weeks    Status New      PT LONG TERM GOAL #6   Title Pt will improve FOTO score to at least 60% in order to demo improved functional outcomes.    Baseline 50%    Time 8    Period Weeks    Status New                   Plan -  04/25/21 1033     Clinical Impression Statement Reviewed HEP this visit, with pt return demo understanding.  Pt demo antalgic pattern with transfers, gait and exercises, noting that the LLE is more troublesome than RLE.  PT educated pt in glut and quad activation throughout and to try to rely on LLE equally as the RLE.  She fatigues through session and needs several seated rest breaks.    Personal Factors and Comorbidities Comorbidity 3+;Past/Current Experience    Comorbidities smoking, PAD, HTN, ETOH abuse, chronic hepatitis C with hepatic coma,GAD, esophageal stricture    Examination-Activity Limitations Locomotion Level;Transfers;Stairs;Squat;Carry    Examination-Participation Restrictions Cleaning;Community Activity;Laundry    Stability/Clinical Decision Making Stable/Uncomplicated  Rehab Potential Good    PT Frequency 2x / week    PT Duration 8 weeks    PT Treatment/Interventions ADLs/Self Care Home Management;DME Instruction;Gait training;Stair training;Therapeutic activities;Functional mobility training;Therapeutic exercise;Balance training;Neuromuscular re-education;Orthotic Fit/Training;Patient/family education;Vestibular    PT Next Visit Plan Upgrade HEP as appropriate.  continue to work on R>L strengthening, standing balance (work EC, narrow BOS, SLS tasks), sit <> stand training. try foot up brace for RLE foot clearance esp when more fatigued.  Dynamic counter balance exercises.    PT Home Exercise Plan [facility]    Consulted and Agree with Plan of Care Patient             Patient will benefit from skilled therapeutic intervention in order to improve the following deficits and impairments:  Abnormal gait, Decreased activity tolerance, Decreased balance, Decreased coordination, Decreased endurance, Decreased knowledge of use of DME, Difficulty walking, Decreased strength, Dizziness, Impaired sensation, Postural dysfunction  Visit Diagnosis: Muscle weakness  (generalized)  Unsteadiness on feet     Problem List Patient Active Problem List   Diagnosis Date Noted   Acute CVA (cerebrovascular accident) ([place]) 12/29/2020   Peripheral arterial disease ([place]) 12/20/2020   History of esophageal stricture 07/08/2020   Insomnia 12/02/2017   Chronic cough 08/14/2017   Poor appetite 08/29/2016   Peptic stricture of esophagus    History of colonic polyps    Hiatal hernia 03/16/2016   Odynophagia 03/16/2016   Dysphagia 03/16/2016   Alcohol abuse 10/26/2015   Essential hypertension 09/26/2015   PAC (premature atrial contraction) 10/11/2014   PVC (premature ventricular contraction) 10/11/2014   Back muscle spasm 06/15/2014   Generalized anxiety disorder 06/15/2014   Smoking 06/15/2014   Chronic hepatitis C without hepatic coma ([place]) 05/28/2013    Glenden Rossell W. 04/25/2021, 11:06 AM Frazier Butt., PT   [facility] [address] [place], [place], [address] Phone: [phone]   Fax:  [phone]  Name: Kara Bailey MRN: [id number] Date of Birth: 07/28/1956

## 2021-04-25 NOTE — Therapy (Signed)
[facility] [name] [place], [place], [address] Phone: [phone]   Fax:  [phone]  Occupational Therapy Treatment  Patient Details  Name: Kara Bailey MRN: [id number] Date of Birth: [birth year] Referring Provider (OT): Cipriano Mile, NP   Encounter Date: 04/25/2021   OT End of Session - 04/25/21 1104     Visit Number 4    Number of Visits 9    Date for OT Re-Evaluation 05/26/21   anticipate only 4 weeks   Authorization Type [employer] Medicare/Medicaid    Authorization Time Period Covered 100%    Authorization - Visit Number 3    Authorization - Number of Visits 10    Progress Note Due on Visit 10    OT Start Time 1103    OT Stop Time 3716    OT Time Calculation (min) 42 min    Activity Tolerance Patient tolerated treatment well    Behavior During Therapy [facility] for tasks assessed/performed             Past Medical History:  Diagnosis Date   Anxiety    Chest pain 08/2014   unspecified   Cirrhosis ([place])    Depression    Dizziness 08/2014   Dyspnea 08/2014   Emphysema lung (HCC)    GERD (gastroesophageal reflux disease)    Heart murmur    Hepatitis C    Hiatal hernia    Hypertension    PAC (premature atrial contraction) 10/11/2014   PAD (peripheral artery disease) (HCC)    Palpitations    PVC (premature ventricular contraction) 10/11/2014   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   Stroke ([place])    just slurred speech   Tachycardia 08/2014   Ventral hernia    Weakness 08/2014    Past Surgical History:  Procedure Laterality Date   APPENDECTOMY     CATARACT EXTRACTION, BILATERAL     COLONOSCOPY     COLONOSCOPY WITH PROPOFOL N/A 04/30/2016   Procedure: COLONOSCOPY WITH PROPOFOL;  Surgeon: Daneil Dolin, MD;  [facility];  Service: Endoscopy;  Laterality: N/A;  1230   ESOPHAGOGASTRODUODENOSCOPY     approximately 2010   ESOPHAGOGASTRODUODENOSCOPY (EGD) WITH PROPOFOL  N/A 04/30/2016   Procedure: ESOPHAGOGASTRODUODENOSCOPY (EGD) WITH PROPOFOL;  Surgeon: Daneil Dolin, MD;  [facility];  Service: Endoscopy;  Laterality: N/A;   LEFT HEART CATHETERIZATION WITH CORONARY ANGIOGRAM N/A 10/29/2014   07-14-20- pt denies this Procedure: LEFT HEART CATHETERIZATION WITH CORONARY ANGIOGRAM;  Surgeon: Burnell Blanks, MD;  [facility] CATH LAB;  Service: Cardiovascular;  Laterality: N/A;   MALONEY DILATION N/A 04/30/2016   Procedure: Venia Minks DILATION;  Surgeon: Daneil Dolin, MD;  [facility];  Service: Endoscopy;  Laterality: N/A;   POLYPECTOMY  04/30/2016   Procedure: POLYPECTOMY;  Surgeon: Daneil Dolin, MD;  [facility];  Service: Endoscopy;;  Sigmoid colon polyp removed via hot snare   UPPER GASTROINTESTINAL ENDOSCOPY      There were no vitals filed for this visit.   Subjective Assessment - 04/25/21 1104     Subjective  Pt denies any pain and changes since last session.    Pertinent History PMH smoking, PAD, HTN, ETOH abuse, chronic hepatitis C with hepatic coma,GAD, esophageal stricture,  recent episode of possible hematemesis    Limitations Fall Risk    Patient Stated Goals "all of that is ok"    Currently in Pain? No/denies  Hand Gripper: with LUE on level 2  (but downgraded to level 1) with black spring. Pt picked up 1 inch blocks with gripper with min drops and min difficulty.   Small Pegs with RUE and with removing with in hand manipulation with RUE. Pt followed pattern with in cues and with correcting errors - no drops of pegs.   Functional Carrying of objects with RUE - plate with golf balls on it cup of liquid. 1 drop of golf balls and no spills with cup. Pt did not drop the objects from hands. Pt worked on word generation of animals during carrying cup for joint attention with no spills.   Flipping Cards BUE for increased coordination                    OT Long Term  Goals - 04/19/21 1544       OT LONG TERM GOAL #1   Title Pt will be independent with HEP for coordination and grip strength    Time 4    Period Weeks    Status On-going      OT LONG TERM GOAL #2   Title Pt will improve grip strength, bilaterally, to a functional strength (35lbs or greater).    Baseline R 32.6, L 26.6    Time 4    Period Weeks    Status On-going      OT LONG TERM GOAL #3   Title Pt will perform physical and cognitive task simultaneously with 90% accuracy    Time 4    Period Weeks    Status New      OT LONG TERM GOAL #4   Title Pt will verbalize understanding of adapted strategies and/or equipment PRN to increase safety and independence with ADLs and IADLs (cutting vegetables, etc)    Time 4    Period Weeks    Status On-going   pt reports not having difficulty with cutting vegetables anymore at home 04/19/21                  Plan - 04/25/21 1108     Clinical Impression Statement Pt continues to progress towards goals.    OT Occupational Profile and History Problem Focused Assessment - Including review of records relating to presenting problem    Occupational performance deficits (Please refer to evaluation for details): ADL's;IADL's    Body Structure / Function / Physical Skills ADL;FMC;Coordination;Decreased knowledge of use of DME;Dexterity;GMC;Strength;UE functional use;Vision;IADL    Cognitive Skills Attention;Sequencing;Safety Awareness;Problem Solve    Rehab Potential Good    Clinical Decision Making Limited treatment options, no task modification necessary    Comorbidities Affecting Occupational Performance: None    Modification or Assistance to Complete Evaluation  No modification of tasks or assist necessary to complete eval    OT Frequency 2x / week    OT Duration 6 weeks   8 visits - anticipate over 4 weeks but may take 6 weeks.   OT Treatment/Interventions Self-care/ADL training;Neuromuscular education;Energy conservation;Functional  Mobility Training;Patient/family education;Cognitive remediation/compensation;Therapeutic exercise;Therapeutic activities;DME and/or AE instruction;Moist Heat;Fluidtherapy;Visual/perceptual remediation/compensation    Plan RUE coordination activities    Recommended Other Services get scheduled for PT (referral in Epic)    Consulted and Agree with Plan of Care Patient             Patient will benefit from skilled therapeutic intervention in order to improve the following deficits and impairments:   Body Structure / Function / Physical Skills: ADL, FMC, Coordination, Decreased  knowledge of use of DME, Dexterity, [place], Strength, UE functional use, Vision, IADL Cognitive Skills: Attention, Sequencing, Safety Awareness, Problem Solve     Visit Diagnosis: Attention and concentration deficit  Other lack of coordination  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)  Muscle weakness (generalized)    Problem List Patient Active Problem List   Diagnosis Date Noted   Acute CVA (cerebrovascular accident) ([place]) 12/29/2020   Peripheral arterial disease ([place]) 12/20/2020   History of esophageal stricture 07/08/2020   Insomnia 12/02/2017   Chronic cough 08/14/2017   Poor appetite 08/29/2016   Peptic stricture of esophagus    History of colonic polyps    Hiatal hernia 03/16/2016   Odynophagia 03/16/2016   Dysphagia 03/16/2016   Alcohol abuse 10/26/2015   Essential hypertension 09/26/2015   PAC (premature atrial contraction) 10/11/2014   PVC (premature ventricular contraction) 10/11/2014   Back muscle spasm 06/15/2014   Generalized anxiety disorder 06/15/2014   Smoking 06/15/2014   Chronic hepatitis C without hepatic coma ([place]) 05/28/2013    Zachery Conch MOT, OTR/L  04/25/2021, 11:44 AM  [facility] [address] [place], [place], [address] Phone:  [phone]   Fax:  [phone]  Name: JOSS FRIEDEL MRN: [id number] Date of Birth: 1956/02/25

## 2021-04-28 ENCOUNTER — Other Ambulatory Visit: Payer: Self-pay

## 2021-04-28 ENCOUNTER — Encounter: Payer: Self-pay | Admitting: Occupational Therapy

## 2021-04-28 ENCOUNTER — Ambulatory Visit: Payer: Medicare HMO | Admitting: Physical Therapy

## 2021-04-28 ENCOUNTER — Encounter: Payer: Self-pay | Admitting: Physical Therapy

## 2021-04-28 ENCOUNTER — Ambulatory Visit: Payer: Medicare HMO

## 2021-04-28 ENCOUNTER — Ambulatory Visit: Payer: Medicare HMO | Admitting: Occupational Therapy

## 2021-04-28 VITALS — BP 142/83 | HR 90

## 2021-04-28 DIAGNOSIS — R278 Other lack of coordination: Secondary | ICD-10-CM

## 2021-04-28 DIAGNOSIS — M6281 Muscle weakness (generalized): Secondary | ICD-10-CM

## 2021-04-28 DIAGNOSIS — R41840 Attention and concentration deficit: Secondary | ICD-10-CM

## 2021-04-28 DIAGNOSIS — R471 Dysarthria and anarthria: Secondary | ICD-10-CM

## 2021-04-28 DIAGNOSIS — R2681 Unsteadiness on feet: Secondary | ICD-10-CM

## 2021-04-28 DIAGNOSIS — R29818 Other symptoms and signs involving the nervous system: Secondary | ICD-10-CM

## 2021-04-28 DIAGNOSIS — I69851 Hemiplegia and hemiparesis following other cerebrovascular disease affecting right dominant side: Secondary | ICD-10-CM

## 2021-04-28 DIAGNOSIS — R41841 Cognitive communication deficit: Secondary | ICD-10-CM

## 2021-04-28 NOTE — Therapy (Signed)
[facility] [address] [place], [place], [address] Phone: [phone]   Fax:  [phone]  Speech Language Pathology Treatment  Patient Details  Name: GLORIANNA GOTT MRN: [id number] Date of Birth: [birth year] Referring Provider (SLP): Cipriano Mile, NP   Encounter Date: 04/28/2021   End of Session - 04/28/21 0758     Visit Number 4    Number of Visits 17    Date for SLP Re-Evaluation 06/09/21    Authorization Type [employer]    Authorization Time Period 04-14-21 to 06-09-21 (12 visits)    SLP Start Time 0800    SLP Stop Time  0845    SLP Time Calculation (min) 45 min    Activity Tolerance Patient tolerated treatment well             Past Medical History:  Diagnosis Date   Anxiety    Chest pain 08/2014   unspecified   Cirrhosis ([place])    Depression    Dizziness 08/2014   Dyspnea 08/2014   Emphysema lung (HCC)    GERD (gastroesophageal reflux disease)    Heart murmur    Hepatitis C    Hiatal hernia    Hypertension    PAC (premature atrial contraction) 10/11/2014   PAD (peripheral artery disease) (HCC)    Palpitations    PVC (premature ventricular contraction) 10/11/2014   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   Stroke ([place])    just slurred speech   Tachycardia 08/2014   Ventral hernia    Weakness 08/2014    Past Surgical History:  Procedure Laterality Date   APPENDECTOMY     CATARACT EXTRACTION, BILATERAL     COLONOSCOPY     COLONOSCOPY WITH PROPOFOL N/A 04/30/2016   Procedure: COLONOSCOPY WITH PROPOFOL;  Surgeon: Daneil Dolin, MD;  [facility];  Service: Endoscopy;  Laterality: N/A;  1230   ESOPHAGOGASTRODUODENOSCOPY     approximately 2010   ESOPHAGOGASTRODUODENOSCOPY (EGD) WITH PROPOFOL N/A 04/30/2016   Procedure: ESOPHAGOGASTRODUODENOSCOPY (EGD) WITH PROPOFOL;  Surgeon: Daneil Dolin, MD;  [facility];  Service: Endoscopy;  Laterality: N/A;   LEFT  HEART CATHETERIZATION WITH CORONARY ANGIOGRAM N/A 10/29/2014   07-14-20- pt denies this Procedure: LEFT HEART CATHETERIZATION WITH CORONARY ANGIOGRAM;  Surgeon: Burnell Blanks, MD;  Location: [facility] CATH LAB;  Service: Cardiovascular;  Laterality: N/A;   MALONEY DILATION N/A 04/30/2016   Procedure: Venia Minks DILATION;  Surgeon: Daneil Dolin, MD;  [facility];  Service: Endoscopy;  Laterality: N/A;   POLYPECTOMY  04/30/2016   Procedure: POLYPECTOMY;  Surgeon: Daneil Dolin, MD;  [facility];  Service: Endoscopy;;  Sigmoid colon polyp removed via hot snare   UPPER GASTROINTESTINAL ENDOSCOPY      There were no vitals filed for this visit.   Subjective Assessment - 04/28/21 0759     Subjective "it's doing good"    Currently in Pain? No/denies                   ADULT SLP TREATMENT - 04/28/21 0757       General Information   Behavior/Cognition Alert;Cooperative;Pleasant mood      Treatment Provided   Treatment provided Cognitive-Linquistic      Cognitive-Linquistic Treatment   Treatment focused on Dysarthria;Patient/family/caregiver education    Skilled Treatment Pt indicated voice feeling heavy and low in throat. Of note, dentures donned today. Pt did not wear dentures during evaluation and pt had reported  change in speech when dentures were worn. Pt exhibited increased difficulty with oral motor coordination this session for [facility]/SMR. Increased work of breath noted during speech tasks. SLP introduced abdominal breathing this session, with usual verbal and visual cues required to improve deep breathing, speaking on exhalation, and not holding breath in. Improved speech intelligiblity noted when abdominal breath utilized.      Assessment / Recommendations / Plan   Plan Continue with current plan of care      Progression Toward Goals   Progression toward goals Progressing toward goals              SLP Education - 04/28/21 1523     Education  Details abdominal breathing, speaking on exhalation    Person(s) Educated Patient    Methods Explanation;Demonstration;Handout;Verbal cues;Tactile cues    Comprehension Verbalized understanding;Returned demonstration;Need further instruction;Verbal cues required;Tactile cues required              SLP Short Term Goals - 04/28/21 0758       SLP SHORT TERM GOAL #1   Title Pt will demonstrate dysarthria compensations in 10 minute simple to mod complex conversation with min A over 2 sessions    Time 3    Period Weeks    Status On-going      SLP SHORT TERM GOAL #2   Title Pt will use memory/attention compensations for appointments, medicine management, and other daily activities with occasional min A over 3 sessions.    Time 3    Period Weeks    Status On-going      SLP SHORT TERM GOAL #3   Title Pt will ID and correct word finding errors with 80% accuracy given occasional min A over 2 sessions    Time 3    Period Weeks    Status On-going      SLP SHORT TERM GOAL #4   Title Pt will verbalize 3 safety concerns in the home and generate solutions to them with occasional min A over 2 sessions    Time 3    Period Weeks    Status On-going      SLP SHORT TERM GOAL #5   Title Pt will complete dysphagia HEP to optimize swallow function with occasional min A over 2 sessions    Time 3    Period Weeks    Status On-going              SLP Long Term Goals - 04/28/21 0759       SLP LONG TERM GOAL #1   Title Pt will demonstrate dysarthria compensations in 15 minute simple to mod complex conversation with min A over 2 sessions    Time 7    Period Weeks    Status On-going      SLP LONG TERM GOAL #2   Title Pt will use memory/attention compensations for appointments, medicine management, and other daily activities with rare min A over 3 sessions.    Time 7    Period Weeks    Status On-going      SLP LONG TERM GOAL #3   Title Pt will ID and correct word finding errors with 95%  accuracy given occasional min A over 2 sessions    Time 7    Period Weeks    Status On-going      SLP LONG TERM GOAL #4   Title Pt will report improved diet tolerance and reduced s/sx of aspiration with rare min over 2 sessions  Time 7    Period Weeks    Status On-going      SLP LONG TERM GOAL #5   Title Pt will undergo repeat objective swallow study as needed if s/sx of aspiration persist    Time 7    Period Weeks    Status On-going              Plan - 04/28/21 1524     Clinical Impression Statement Mariella cont to present with cognitive communication, dysarthria, and swallowing s/p CVA in February 2022. SLP reviewed speech compensations (slow, loud, over enunciate, pause) and also "letting it roll out of your mouth" to foster lighter more gentle speech due to sx of dysfluency. SLP introduced abdominal breathing this session due to incoordination exhibited for speaking on exhalation and pt endorsed she "holds her breath." Abdominal breathing added to dysarthria HEP to improve carryover and comprehension. Skilled ST is cont'd recommended to address speech, cognitive communication, and dysphagia impacting pt's overall safety and functional independence compared to baseline.    Speech Therapy Frequency 2x / week    Duration 8 weeks   or 17 total visits   Treatment/Interventions Aspiration precaution training;Oral motor exercises;Compensatory strategies;Pharyngeal strengthening exercises;Functional tasks;Patient/family education;Diet toleration management by SLP;Cognitive reorganization;Multimodal communcation approach;Language facilitation;Compensatory techniques;Internal/external aids;SLP instruction and feedback    Potential to Achieve Goals Fair    Potential Considerations Ability to learn/carryover information;Severity of impairments    SLP Home Exercise Plan provided    Consulted and Agree with Plan of Care Patient             Patient will benefit from skilled therapeutic  intervention in order to improve the following deficits and impairments:   Dysarthria and anarthria  Cognitive communication deficit    Problem List Patient Active Problem List   Diagnosis Date Noted   Acute CVA (cerebrovascular accident) ([place]) 12/29/2020   Peripheral arterial disease ([place]) 12/20/2020   History of esophageal stricture 07/08/2020   Insomnia 12/02/2017   Chronic cough 08/14/2017   Poor appetite 08/29/2016   Peptic stricture of esophagus    History of colonic polyps    Hiatal hernia 03/16/2016   Odynophagia 03/16/2016   Dysphagia 03/16/2016   Alcohol abuse 10/26/2015   Essential hypertension 09/26/2015   PAC (premature atrial contraction) 10/11/2014   PVC (premature ventricular contraction) 10/11/2014   Back muscle spasm 06/15/2014   Generalized anxiety disorder 06/15/2014   Smoking 06/15/2014   Chronic hepatitis C without hepatic coma ([place]) 05/28/2013    Alinda Deem, MA CCC-SLP 04/28/2021, 3:27 PM  [facility] [address] [place], [place], [address] Phone: [phone]   Fax:  [phone]   Name: BERNADETTE GORES MRN: [id number] Date of Birth: Jan 07, 1956

## 2021-04-28 NOTE — Therapy (Signed)
[facility] [address] [place], [place], [address] Phone: [phone]   Fax:  [phone]  Occupational Therapy Treatment  Patient Details  Name: Kara Bailey MRN: [id number] Date of Birth: June 27, 1956 Referring Provider (OT): Cipriano Mile, NP   Encounter Date: 04/28/2021   OT End of Session - 04/28/21 0846     Visit Number 5    Number of Visits 9    Date for OT Re-Evaluation 05/26/21   anticipate only 4 weeks   Authorization Type [employer] Medicare/Medicaid    Authorization Time Period Covered 100%    Authorization - Visit Number 4    Authorization - Number of Visits 10    Progress Note Due on Visit 10    OT Start Time 0845    OT Stop Time 9509    OT Time Calculation (min) 40 min    Activity Tolerance Patient tolerated treatment well    Behavior During Therapy [facility] for tasks assessed/performed             Past Medical History:  Diagnosis Date   Anxiety    Chest pain 08/2014   unspecified   Cirrhosis ([place])    Depression    Dizziness 08/2014   Dyspnea 08/2014   Emphysema lung ([place])    GERD (gastroesophageal reflux disease)    Heart murmur    Hepatitis C    Hiatal hernia    Hypertension    PAC (premature atrial contraction) 10/11/2014   PAD (peripheral artery disease) (HCC)    Palpitations    PVC (premature ventricular contraction) 10/11/2014   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   Stroke ([place])    just slurred speech   Tachycardia 08/2014   Ventral hernia    Weakness 08/2014    Past Surgical History:  Procedure Laterality Date   APPENDECTOMY     CATARACT EXTRACTION, BILATERAL     COLONOSCOPY     COLONOSCOPY WITH PROPOFOL N/A 04/30/2016   Procedure: COLONOSCOPY WITH PROPOFOL;  Surgeon: Daneil Dolin, MD;  [facility];  Service: Endoscopy;  Laterality: N/A;  1230   ESOPHAGOGASTRODUODENOSCOPY     approximately 2010   ESOPHAGOGASTRODUODENOSCOPY (EGD) WITH PROPOFOL  N/A 04/30/2016   Procedure: ESOPHAGOGASTRODUODENOSCOPY (EGD) WITH PROPOFOL;  Surgeon: Daneil Dolin, MD;  [facility];  Service: Endoscopy;  Laterality: N/A;   LEFT HEART CATHETERIZATION WITH CORONARY ANGIOGRAM N/A 10/29/2014   07-14-20- pt denies this Procedure: LEFT HEART CATHETERIZATION WITH CORONARY ANGIOGRAM;  Surgeon: Burnell Blanks, MD;  Location: [facility] CATH LAB;  Service: Cardiovascular;  Laterality: N/A;   MALONEY DILATION N/A 04/30/2016   Procedure: Venia Minks DILATION;  Surgeon: Daneil Dolin, MD;  [facility];  Service: Endoscopy;  Laterality: N/A;   POLYPECTOMY  04/30/2016   Procedure: POLYPECTOMY;  Surgeon: Daneil Dolin, MD;  [facility];  Service: Endoscopy;;  Sigmoid colon polyp removed via hot snare   UPPER GASTROINTESTINAL ENDOSCOPY      There were no vitals filed for this visit.   Subjective Assessment - 04/28/21 0847     Subjective  "Same ole, Same ole" - "I am so tired of bending over and getting stuff I've dropped"    Pertinent History PMH smoking, PAD, HTN, ETOH abuse, chronic hepatitis C with hepatic coma,GAD, esophageal stricture,  recent episode of possible hematemesis    Limitations Fall Risk    Patient Stated Goals "all of that is ok"    Currently in Pain?  No/denies             Golf Balls  max difficulty with RUE.   Grooved Pegs with RUE and min difficulty and drops. Pt removed with in hand manipulation with mod difficulty   Hand Gripper: with RUE on level 2  and LUE on level 1 with black spring. Pt picked up 1 inch blocks with gripper with min drops and min difficulty.  Environmental Scanning while holding plate of objects in RUE with 13/16 accuracy = 81% on first pass and no drops. Pt required mod cues for locating remaining items on second pass.                       OT Long Term Goals - 04/28/21 0924       OT LONG TERM GOAL #1   Title Pt will be independent with HEP for coordination and  grip strength    Time 4    Period Weeks    Status On-going      OT LONG TERM GOAL #2   Title Pt will improve grip strength, bilaterally, to a functional strength (35lbs or greater).    Baseline R 32.6, L 26.6    Time 4    Period Weeks    Status On-going      OT LONG TERM GOAL #3   Title Pt will perform physical and cognitive task simultaneously with 90% accuracy    Time 4    Period Weeks    Status On-going      OT LONG TERM GOAL #4   Title Pt will verbalize understanding of adapted strategies and/or equipment PRN to increase safety and independence with ADLs and IADLs (cutting vegetables, etc)    Time 4    Period Weeks    Status On-going   pt reports not having difficulty with cutting vegetables anymore at home 04/19/21                  Plan - 04/28/21 1120     Clinical Impression Statement Pt making progress with goals. Continue with environmental scanning and added physical or cognitive component.    OT Occupational Profile and History Problem Focused Assessment - Including review of records relating to presenting problem    Occupational performance deficits (Please refer to evaluation for details): ADL's;IADL's    Body Structure / Function / Physical Skills ADL;FMC;Coordination;Decreased knowledge of use of DME;Dexterity;GMC;Strength;UE functional use;Vision;IADL    Cognitive Skills Attention;Sequencing;Safety Awareness;Problem Solve    Rehab Potential Good    Clinical Decision Making Limited treatment options, no task modification necessary    Comorbidities Affecting Occupational Performance: None    Modification or Assistance to Complete Evaluation  No modification of tasks or assist necessary to complete eval    OT Frequency 2x / week    OT Duration 6 weeks   8 visits - anticipate over 4 weeks but may take 6 weeks.   OT Treatment/Interventions Self-care/ADL training;Neuromuscular education;Energy conservation;Functional Mobility Training;Patient/family  education;Cognitive remediation/compensation;Therapeutic exercise;Therapeutic activities;DME and/or AE instruction;Moist Heat;Fluidtherapy;Visual/perceptual remediation/compensation    Plan RUE coordination activities, environmental scanning with cog or phys component.    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)  Unsteadiness on feet  Other lack of coordination  Other symptoms and signs involving the nervous system    Problem List Patient Active Problem List   Diagnosis Date Noted   Acute CVA (cerebrovascular accident) ([place]) 12/29/2020   Peripheral arterial disease ([place]) 12/20/2020   History of esophageal stricture 07/08/2020   Insomnia 12/02/2017   Chronic cough 08/14/2017   Poor appetite 08/29/2016   Peptic stricture of esophagus    History of colonic polyps    Hiatal hernia 03/16/2016   Odynophagia 03/16/2016   Dysphagia 03/16/2016   Alcohol abuse 10/26/2015   Essential hypertension 09/26/2015   PAC (premature atrial contraction) 10/11/2014   PVC (premature ventricular contraction) 10/11/2014   Back muscle spasm 06/15/2014   Generalized anxiety disorder 06/15/2014   Smoking 06/15/2014   Chronic hepatitis C without hepatic coma ([place]) 05/28/2013    Zachery Conch MOT, OTR/L  04/28/2021, 11:21 AM  [facility] [address] [place], [place], [address] Phone: [phone]   Fax:  [phone]  Name: Kara Bailey MRN: [id number] Date of Birth: 12-10-1955

## 2021-04-28 NOTE — Patient Instructions (Addendum)
  Remember to fill out your weekly schedule for next week   "Let it flow" for your speech. Don't hold your breath    ABDOMINAL BREATHING   Shoulders down - this is a cue to relax Place your hand on your abdomen - this helps you focus on easy abdominal breath support - the best and most relaxed way to breathe Breathe in through your nose and fill your belly with air, watching your hand move outward Breathe out through your mouth and watch your belly move in. An audible "sh"  may help   Think of your belly as a balloon, when you fill with air (inhale), the balloon gets bigger. As the air goes out (exhale), the balloon deflates.  If you are having difficulty coordinating this, lay on your back with a plastic cup on your belly and repeat the above steps, watching you belly move up with inhalation and down with exhalations  Practice breathing in and out in front of a mirror, watching your belly Breathe in for a count of 5 and breathe out for a count of 5  Now as you breathe out, get a picture of relaxing in your mind Feel the constant in-out of your breathing with your belly Picture the tension in your throat and chest evaporate like steam, melting away and FEEL it do so Picture your throat opening up so wide that a grapefruit or softball could fit through your throat.

## 2021-04-28 NOTE — Therapy (Signed)
[facility] [address] [place], [place], [address] Phone: [phone]   Fax:  [phone]  Physical Therapy Treatment  Patient Details  Name: Kara Bailey MRN: [id number] Date of Birth: Dec 31, 1955 Referring Provider (PT): Cipriano Mile, NP   Encounter Date: 04/28/2021   PT End of Session - 04/28/21 1011     Visit Number 4    Number of Visits 17    Date for PT Re-Evaluation 07/17/21   written for 60 day POC   Authorization Type [employer] - auth from 6/7-07/17/21    PT Start Time 0928    PT Stop Time 1011    PT Time Calculation (min) 43 min    Equipment Utilized During Treatment Gait belt    Activity Tolerance Patient tolerated treatment well    Behavior During Therapy [facility] for tasks assessed/performed             Past Medical History:  Diagnosis Date   Anxiety    Chest pain 08/2014   unspecified   Cirrhosis ([place])    Depression    Dizziness 08/2014   Dyspnea 08/2014   Emphysema lung ([place])    GERD (gastroesophageal reflux disease)    Heart murmur    Hepatitis C    Hiatal hernia    Hypertension    PAC (premature atrial contraction) 10/11/2014   PAD (peripheral artery disease) (HCC)    Palpitations    PVC (premature ventricular contraction) 10/11/2014   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   Stroke ([place])    just slurred speech   Tachycardia 08/2014   Ventral hernia    Weakness 08/2014    Past Surgical History:  Procedure Laterality Date   APPENDECTOMY     CATARACT EXTRACTION, BILATERAL     COLONOSCOPY     COLONOSCOPY WITH PROPOFOL N/A 04/30/2016   Procedure: COLONOSCOPY WITH PROPOFOL;  Surgeon: Daneil Dolin, MD;  [facility];  Service: Endoscopy;  Laterality: N/A;  1230   ESOPHAGOGASTRODUODENOSCOPY     approximately 2010   ESOPHAGOGASTRODUODENOSCOPY (EGD) WITH PROPOFOL N/A 04/30/2016   Procedure: ESOPHAGOGASTRODUODENOSCOPY (EGD) WITH PROPOFOL;  Surgeon: Daneil Dolin, MD;  [facility];  Service: Endoscopy;  Laterality: N/A;   LEFT HEART CATHETERIZATION WITH CORONARY ANGIOGRAM N/A 10/29/2014   07-14-20- pt denies this Procedure: LEFT HEART CATHETERIZATION WITH CORONARY ANGIOGRAM;  Surgeon: Burnell Blanks, MD;  Location: [facility] CATH LAB;  Service: Cardiovascular;  Laterality: N/A;   MALONEY DILATION N/A 04/30/2016   Procedure: Venia Minks DILATION;  Surgeon: Daneil Dolin, MD;  [facility];  Service: Endoscopy;  Laterality: N/A;   POLYPECTOMY  04/30/2016   Procedure: POLYPECTOMY;  Surgeon: Daneil Dolin, MD;  [facility];  Service: Endoscopy;;  Sigmoid colon polyp removed via hot snare   UPPER GASTROINTESTINAL ENDOSCOPY      Vitals:   04/28/21 0932  BP: (!) 142/83  Pulse: 90     Subjective Assessment - 04/28/21 0930     Subjective Has been trying to use her left leg more and it has been going well.    Pertinent History smoking, PAD, HTN, ETOH abuse, chronic hepatitis C with hepatic coma,GAD, esophageal stricture    Limitations Walking;Standing    Patient Stated Goals wants to work on walking normally    Currently in Pain? No/denies  [facility] Adult PT Treatment/Exercise - 04/28/21 0934       Transfers   Transfers Sit to Stand;Stand to Sit    Sit to Stand 5: Supervision;With upper extremity assist;From chair/3-in-1    Stand to Sit 5: Supervision;With upper extremity assist;Uncontrolled descent    Comments cues to scoot out towards edge, proper foot placement, and nose over toes to stand, and slow descent and hingeing at hips into sitting; performed x6 reps, pt with more equal weight through BLE      Exercises   Exercises Other Exercises    Other Exercises  seated marching: 2 x 5 reps B - cues for midline posture, supine: partial SLR 2 x 5 reps RLE, cues for technique and AAROM needed for RLE, unable to perform on LLE, SAQs: x10 reps with RLE, 2 x 10 reps with  LLE cues for slowed descent. seated hip ABD with red tband around thighs x10 reps                 Balance Exercises - 04/28/21 0001       Balance Exercises: Standing   Standing Eyes Opened Narrow base of support (BOS);Solid surface;Head turns;Limitations    Standing Eyes Opened Limitations cues for soft bend in the knees, 2 x 10 reps head turns, 2 x 10 reps head nods    Standing Eyes Closed Wide (BOA);Narrow base of support (BOS);Solid surface;Limitations    Standing Eyes Closed Limitations beginning with wider BOS 2 x 30 seconds, and more narrow BOS holding for 20 seconds    Sidestepping 2 reps;Upper extremity support;Limitations    Sidestepping Limitations R and L along counter, cues to keep hips facing counter and avoid hip external rotation, cues for hip clearance    Other Standing Exercises with BUE support: lateral weight shifting x10 reps with heel raise, step out and then in back to middle alternating legs x10 reps B                 PT Short Term Goals - 04/19/21 2025       PT SHORT TERM GOAL #1   Title Pt will be independent with initial HEP in order to build upon functional gains made in therapy. ALL STGS DUE 05/16/21    Baseline currently dependent    Time 4    Period Weeks    Status New    Target Date 05/16/21      PT SHORT TERM GOAL #2   Title Pt will finish BERG assessment to determine fall risk with LTG written    Baseline 43/56 - LTG written    Time 4    Period Weeks    Status Achieved      PT SHORT TERM GOAL #3   Title Pt will decr TUG time to 17 seconds or less with no AD in order to demo decr fall risk.    Baseline 20.47 seconds    Time 4    Period Weeks    Status New      PT SHORT TERM GOAL #4   Title Pt will improve gait speed to at least 2.2 ft/sec with no AD vs. LRAD in order to demo decr fall risk.    Baseline 1.97 ft/sec    Time 4    Period Weeks    Status New      PT SHORT TERM GOAL #5   Title Pt will decr 5x sit <> stand time  to 50 seconds or less in order to  demo improved BLE strength and balance.    Baseline 64.32 seconds no UE support    Time 4    Period Weeks    Status New               PT Long Term Goals - 04/19/21 2026       PT LONG TERM GOAL #1   Title Pt will improve BERG score to at least a 48/56 in order to demo decr fall risk. ALL LTGS DUE 06/13/21    Baseline 43/56    Time 8    Period Weeks    Status Revised      PT LONG TERM GOAL #2   Title Pt will go ascend and descend 4 steps with single handrail vs. use of LRAD with supervision in order to be able to go to her son's house.    Baseline can not perform stairs    Time 8    Period Weeks    Status New      PT LONG TERM GOAL #3   Title Pt will decr 5x sit <> stand time to 40 seconds or less in order to demo improved BLE strength and balance.    Baseline 64.32 seconds no UE support    Time 8    Period Weeks    Status New      PT LONG TERM GOAL #4   Title Pt will ambulate at least 300' outdoors over unlevel paved surfaces with LRAD in order to demo improved community mobility.    Baseline not yet assessed.    Time 8    Period Weeks    Status New      PT LONG TERM GOAL #5   Title Pt will decr TUG time to 15 seconds or less with no AD in order to demo decr fall risk.    Baseline 20.47 seconds    Time 8    Period Weeks    Status New      PT LONG TERM GOAL #6   Title Pt will improve FOTO score to at least 60% in order to demo improved functional outcomes.    Baseline 50%    Time 8    Period Weeks    Status New                   Plan - 04/28/21 1207     Clinical Impression Statement Today's skilled session continued to focus on BLE strengthening and standing balance strategies in the corner and at the countertop. Pt with improvement of sit <> stands today with more equal weight bearing and less of an antalgic pattern. Needed seated rest breaks intermittently throughout session. Noted LLE weaker than RLE with terminal  quad actvation in open chain with SLRs and SAQs.    Personal Factors and Comorbidities Comorbidity 3+;Past/Current Experience    Comorbidities smoking, PAD, HTN, ETOH abuse, chronic hepatitis C with hepatic coma,GAD, esophageal stricture    Examination-Activity Limitations Locomotion Level;Transfers;Stairs;Squat;Carry    Examination-Participation Restrictions Cleaning;Community Activity;Laundry    Stability/Clinical Decision Making Stable/Uncomplicated    Rehab Potential Good    PT Frequency 2x / week    PT Duration 8 weeks    PT Treatment/Interventions ADLs/Self Care Home Management;DME Instruction;Gait training;Stair training;Therapeutic activities;Functional mobility training;Therapeutic exercise;Balance training;Neuromuscular re-education;Orthotic Fit/Training;Patient/family education;Vestibular    PT Next Visit Plan continue to work on BLE strengthening, standing balance (work EC, narrow BOS, SLS tasks), sit <> stand training. try foot up brace for RLE foot  clearance esp when more fatigued.  Dynamic counter balance exercises.    PT Home Exercise Plan [facility]    Consulted and Agree with Plan of Care Patient             Patient will benefit from skilled therapeutic intervention in order to improve the following deficits and impairments:  Abnormal gait, Decreased activity tolerance, Decreased balance, Decreased coordination, Decreased endurance, Decreased knowledge of use of DME, Difficulty walking, Decreased strength, Dizziness, Impaired sensation, Postural dysfunction  Visit Diagnosis: Muscle weakness (generalized)  Unsteadiness on feet  Other symptoms and signs involving the nervous system     Problem List Patient Active Problem List   Diagnosis Date Noted   Acute CVA (cerebrovascular accident) ([place]) 12/29/2020   Peripheral arterial disease ([place]) 12/20/2020   History of esophageal stricture 07/08/2020   Insomnia 12/02/2017   Chronic cough 08/14/2017   Poor appetite  08/29/2016   Peptic stricture of esophagus    History of colonic polyps    Hiatal hernia 03/16/2016   Odynophagia 03/16/2016   Dysphagia 03/16/2016   Alcohol abuse 10/26/2015   Essential hypertension 09/26/2015   PAC (premature atrial contraction) 10/11/2014   PVC (premature ventricular contraction) 10/11/2014   Back muscle spasm 06/15/2014   Generalized anxiety disorder 06/15/2014   Smoking 06/15/2014   Chronic hepatitis C without hepatic coma ([place]) 05/28/2013    Arliss Journey, PT, DPT  04/28/2021, 12:08 PM  [facility] [address] [place], [place], [address] Phone: [phone]   Fax:  [phone]  Name: Kara Bailey MRN: [id number] Date of Birth: 21-Apr-1956

## 2021-05-02 ENCOUNTER — Other Ambulatory Visit: Payer: Self-pay

## 2021-05-02 ENCOUNTER — Ambulatory Visit: Payer: Medicare HMO

## 2021-05-02 ENCOUNTER — Encounter: Payer: Self-pay | Admitting: Occupational Therapy

## 2021-05-02 ENCOUNTER — Encounter: Payer: Self-pay | Admitting: Physical Therapy

## 2021-05-02 ENCOUNTER — Ambulatory Visit: Payer: Medicare HMO | Admitting: Physical Therapy

## 2021-05-02 ENCOUNTER — Ambulatory Visit: Payer: Medicare HMO | Admitting: Occupational Therapy

## 2021-05-02 DIAGNOSIS — R471 Dysarthria and anarthria: Secondary | ICD-10-CM

## 2021-05-02 DIAGNOSIS — R278 Other lack of coordination: Secondary | ICD-10-CM

## 2021-05-02 DIAGNOSIS — R2681 Unsteadiness on feet: Secondary | ICD-10-CM

## 2021-05-02 DIAGNOSIS — R29818 Other symptoms and signs involving the nervous system: Secondary | ICD-10-CM

## 2021-05-02 DIAGNOSIS — M6281 Muscle weakness (generalized): Secondary | ICD-10-CM

## 2021-05-02 DIAGNOSIS — I69851 Hemiplegia and hemiparesis following other cerebrovascular disease affecting right dominant side: Secondary | ICD-10-CM

## 2021-05-02 DIAGNOSIS — R4701 Aphasia: Secondary | ICD-10-CM

## 2021-05-02 DIAGNOSIS — R482 Apraxia: Secondary | ICD-10-CM

## 2021-05-02 DIAGNOSIS — R1310 Dysphagia, unspecified: Secondary | ICD-10-CM

## 2021-05-02 DIAGNOSIS — R41841 Cognitive communication deficit: Secondary | ICD-10-CM

## 2021-05-02 DIAGNOSIS — R41840 Attention and concentration deficit: Secondary | ICD-10-CM

## 2021-05-02 NOTE — Therapy (Signed)
[facility] [address] [place], [place], [address] Phone: [phone]   Fax:  [phone]  Occupational Therapy Treatment  Patient Details  Name: Kara Bailey MRN: [id number] Date of Birth: Sep 08, 1956 Referring Provider (OT): Cipriano Mile, NP   Encounter Date: 05/02/2021   OT End of Session - 05/02/21 1018     Visit Number 6    Number of Visits 9    Date for OT Re-Evaluation 05/26/21   anticipate only 4 weeks   Authorization Type [employer] Medicare/Med[employer]    Authorization Time Period Covered 100%    Authorization - Visit Number 5    Authorization - Number of Visits 10    Progress Note Due on Visit 10    OT Start Time 1016    OT Stop Time 1100    OT Time Calculation (min) 44 min    Activity Tolerance Patient tolerated treatment well    Behavior During Therapy [facility] for tasks assessed/performed             Past Medical History:  Diagnosis Date   Anxiety    Chest pain 08/2014   unspecified   Cirrhosis ([place])    Depression    Dizziness 08/2014   Dyspnea 08/2014   Emphysema lung ([place])    GERD (gastroesophageal reflux disease)    Heart murmur    Hepatitis C    Hiatal hernia    Hypertension    PAC (premature atrial contraction) 10/11/2014   PAD (peripheral artery disease) (HCC)    Palpitations    PVC (premature ventricular contraction) 10/11/2014   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   Stroke ([place])    just slurred speech   Tachycardia 08/2014   Ventral hernia    Weakness 08/2014    Past Surgical History:  Procedure Laterality Date   APPENDECTOMY     CATARACT EXTRACTION, BILATERAL     COLONOSCOPY     COLONOSCOPY WITH PROPOFOL N/A 04/30/2016   Procedure: COLONOSCOPY WITH PROPOFOL;  Surgeon: Daneil Dolin, MD;  [facility];  Service: Endoscopy;  Laterality: N/A;  1230   ESOPHAGOGASTRODUODENOSCOPY     approximately 2010   ESOPHAGOGASTRODUODENOSCOPY (EGD) WITH PROPOFOL  N/A 04/30/2016   Procedure: ESOPHAGOGASTRODUODENOSCOPY (EGD) WITH PROPOFOL;  Surgeon: Daneil Dolin, MD;  [facility];  Service: Endoscopy;  Laterality: N/A;   LEFT HEART CATHETERIZATION WITH CORONARY ANGIOGRAM N/A 10/29/2014   07-14-20- pt denies this Procedure: LEFT HEART CATHETERIZATION WITH CORONARY ANGIOGRAM;  Surgeon: Burnell Blanks, MD;  [facility] CATH LAB;  Service: Cardiovascular;  Laterality: N/A;   MALONEY DILATION N/A 04/30/2016   Procedure: Venia Minks DILATION;  Surgeon: Daneil Dolin, MD;  [facility];  Service: Endoscopy;  Laterality: N/A;   POLYPECTOMY  04/30/2016   Procedure: POLYPECTOMY;  Surgeon: Daneil Dolin, MD;  [facility];  Service: Endoscopy;;  Sigmoid colon polyp removed via hot snare   UPPER GASTROINTESTINAL ENDOSCOPY      There were no vitals filed for this visit.   Subjective Assessment - 05/02/21 1018     Subjective  "its good" - pt reports no pain or changes.    Pertinent History PMH smoking, PAD, HTN, ETOH abuse, chronic hepatitis C with hepatic coma,GAD, esophageal stricture,  recent episode of possible hematemesis    Limitations Fall Risk    Patient Stated Goals "all of that is ok"    Currently in Pain? No/denies  Hand Gripper: with LUE on level 1 and RUE on level 2 with black spring. Pt picked up 1 inch blocks with gripper with mod drops and mod difficulty.  Coordination with hackysack - tossing between hands and tossing up unilaterally (BUE). Coordination more impaired on L.  Tabletop Scanning 88's cancellation. Pt wearing reading glasses. Pt switched from RUE to LUE for highlighting shortly after starting task d/t fatigue in RUE. 77/80 pt with organized fashion and checked back with her work to make sure she got all of them.  PVC [place] with increased time but completed with self correcting errors with no cues.                       OT Long Term Goals - 04/28/21 0924        OT LONG TERM GOAL #1   Title Pt will be independent with HEP for coordination and grip strength    Time 4    Period Weeks    Status On-going      OT LONG TERM GOAL #2   Title Pt will improve grip strength, bilaterally, to a functional strength (35lbs or greater).    Baseline R 32.6, L 26.6    Time 4    Period Weeks    Status On-going      OT LONG TERM GOAL #3   Title Pt will perform physical and cognitive task simultaneously with 90% accuracy    Time 4    Period Weeks    Status On-going      OT LONG TERM GOAL #4   Title Pt will verbalize understanding of adapted strategies and/or equipment PRN to increase safety and independence with ADLs and IADLs (cutting vegetables, etc)    Time 4    Period Weeks    Status On-going   pt reports not having difficulty with cutting vegetables anymore at home 04/19/21                  Plan - 05/02/21 1039     Clinical Impression Statement Pt continues to progress towards unmet goals.    OT Occupational Profile and History Problem Focused Assessment - Including review of records relating to presenting problem    Occupational performance deficits (Please refer to evaluation for details): ADL's;IADL's    Body Structure / Function / Physical Skills ADL;FMC;Coordination;Decreased knowledge of use of DME;Dexterity;GMC;Strength;UE functional use;Vision;IADL    Cognitive Skills Attention;Sequencing;Safety Awareness;Problem Solve    Rehab Potential Good    Clinical Decision Making Limited treatment options, no task modification necessary    Comorbidities Affecting Occupational Performance: None    Modification or Assistance to Complete Evaluation  No modification of tasks or assist necessary to complete eval    OT Frequency 2x / week    OT Duration 6 weeks   8 visits - anticipate over 4 weeks but may take 6 weeks.   OT Treatment/Interventions Self-care/ADL training;Neuromuscular education;Energy conservation;Functional Mobility  Training;Patient/family education;Cognitive remediation/compensation;Therapeutic exercise;Therapeutic activities;DME and/or AE instruction;Moist Heat;Fluidtherapy;Visual/perceptual remediation/compensation    Plan RUE coordination activities, environmental scanning with cog or phys component.    Recommended Other Services get scheduled for PT (referral in Epic)    Consulted and Agree with Plan of Care Patient             Patient will benefit from skilled therapeutic intervention in order to improve the following deficits and impairments:   Body Structure / Function / Physical Skills: ADL, FMC, Coordination, Decreased knowledge of  use of DME, Dexterity, GMC, Strength, UE functional use, Vision, IADL Cognitive Skills: Attention, Sequencing, Safety Awareness, Problem Solve     Visit Diagnosis: Muscle weakness (generalized)  Unsteadiness on feet  Other lack of coordination  Attention and concentration deficit  Hemiplegia and hemiparesis following other cerebrovascular disease affecting right dominant side ([place])    Problem List Patient Active Problem List   Diagnosis Date Noted   Acute CVA (cerebrovascular accident) ([place]) 12/29/2020   Peripheral arterial disease ([place]) 12/20/2020   History of esophageal stricture 07/08/2020   Insomnia 12/02/2017   Chronic cough 08/14/2017   Poor appetite 08/29/2016   Peptic stricture of esophagus    History of colonic polyps    Hiatal hernia 03/16/2016   Odynophagia 03/16/2016   Dysphagia 03/16/2016   Alcohol abuse 10/26/2015   Essential hypertension 09/26/2015   PAC (premature atrial contraction) 10/11/2014   PVC (premature ventricular contraction) 10/11/2014   Back muscle spasm 06/15/2014   Generalized anxiety disorder 06/15/2014   Smoking 06/15/2014   Chronic hepatitis C without hepatic coma ([place]) 05/28/2013    Zachery Conch MOT, OTR/L  05/02/2021, 11:12 AM  [facility] [address] [place], [place], [address] Phone: [phone]   Fax:  [phone]  Name: Kara Bailey MRN: [id number] Date of Birth: 02-10-1956

## 2021-05-02 NOTE — Therapy (Signed)
[facility] [address] [place], [place], [address] Phone: [phone]   Fax:  [phone]  Physical Therapy Treatment  Patient Details  Name: Kara Bailey MRN: [id number] Date of Birth: [birth year] Referring Provider (PT): Cipriano Mile, NP   Encounter Date: 05/02/2021   PT End of Session - 05/02/21 1027     Visit Number 5    Number of Visits 17    Date for PT Re-Evaluation 07/17/21   written for 60 day POC   Authorization Type [employer] - auth from 6/7-07/17/21    PT Start Time 0933    PT Stop Time 1014    PT Time Calculation (min) 41 min    Equipment Utilized During Treatment Gait belt    Activity Tolerance Patient tolerated treatment well    Behavior During Therapy [facility] for tasks assessed/performed             Past Medical History:  Diagnosis Date   Anxiety    Chest pain 08/2014   unspecified   Cirrhosis ([place])    Depression    Dizziness 08/2014   Dyspnea 08/2014   Emphysema lung ([place])    GERD (gastroesophageal reflux disease)    Heart murmur    Hepatitis C    Hiatal hernia    Hypertension    PAC (premature atrial contraction) 10/11/2014   PAD (peripheral artery disease) (HCC)    Palpitations    PVC (premature ventricular contraction) 10/11/2014   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   Stroke ([place])    just slurred speech   Tachycardia 08/2014   Ventral hernia    Weakness 08/2014    Past Surgical History:  Procedure Laterality Date   APPENDECTOMY     CATARACT EXTRACTION, BILATERAL     COLONOSCOPY     COLONOSCOPY WITH PROPOFOL N/A 04/30/2016   Procedure: COLONOSCOPY WITH PROPOFOL;  Surgeon: Daneil Dolin, MD;  [facility];  Service: Endoscopy;  Laterality: N/A;  1230   ESOPHAGOGASTRODUODENOSCOPY     approximately 2010   ESOPHAGOGASTRODUODENOSCOPY (EGD) WITH PROPOFOL N/A 04/30/2016   Procedure: ESOPHAGOGASTRODUODENOSCOPY (EGD) WITH PROPOFOL;  Surgeon: Daneil Dolin, MD;  [facility];  Service: Endoscopy;  Laterality: N/A;   LEFT HEART CATHETERIZATION WITH CORONARY ANGIOGRAM N/A 10/29/2014   07-14-20- pt denies this Procedure: LEFT HEART CATHETERIZATION WITH CORONARY ANGIOGRAM;  Surgeon: Burnell Blanks, MD;  Location: [facility]ATH LAB;  Service: Cardiovascular;  Laterality: N/A;   MALONEY DILATION N/A 04/30/2016   Procedure: Venia Minks DILATION;  Surgeon: Daneil Dolin, MD;  [facility];  Service: Endoscopy;  Laterality: N/A;   POLYPECTOMY  04/30/2016   Procedure: POLYPECTOMY;  Surgeon: Daneil Dolin, MD;  [facility];  Service: Endoscopy;;  Sigmoid colon polyp removed via hot snare   UPPER GASTROINTESTINAL ENDOSCOPY      There were no vitals filed for this visit.   Subjective Assessment - 05/02/21 0935     Subjective Feels like she is getting stronger. Rolled off the bed on Saturday. Husband had to help her get up. R side is feeling sore today and it is getting better.    Pertinent History smoking, PAD, HTN, ETOH abuse, chronic hepatitis C with hepatic coma,GAD, esophageal stricture    Limitations Walking;Standing    Patient Stated Goals wants to work on walking normally    Currently in Pain? No/denies  [facility] Adult PT Treatment/Exercise - 05/02/21 0941       Exercises   Exercises Knee/Hip      Knee/Hip Exercises: Aerobic   Stepper Seated Sci[employer] Stepper with BLE/BUE and at times BLE only at gear 1.2 for strengthening/ROM/activity tolerance for 5 minutes      Knee/Hip Exercises: Seated   Long Arc Quad Strengthening;AROM;Both;1 set;10 reps    [employer] Limitations reviewed from [employer] as pt reports she is not performing these at home, educated on purpose of exercise                 Balance Exercises - 05/02/21 0001       Balance Exercises: Standing   Standing Eyes Opened Wide (BOA);Foam/compliant surface    Standing Eyes Opened Limitations  on single pillow, cues for soft bend in the knees, x10 reps head turns, x10 reps head nods    SLS Eyes open;Solid surface;Limitations    SLS Limitations alternating SLS taps to 4" step, BUE support>B fingertip support, x10 reps each side, cues for technique and slower for SLS. with UE support 2 x 10 seconds SLS each side    Partial Tandem Stance Eyes open;2 reps;30 secs   performed x2 reps each leg   Retro Gait 2 reps;Limitations    Retro Gait Limitations along // bars, cues for weight shift and step length    Sidestepping 2 reps;Upper extremity support;Limitations    Sidestepping Limitations R and L in // bars, cues to keep hips facing counter and avoid hip external rotation, cues for hip clearance    Other Standing Exercises on floor with BUE support and wide BOS: lateral weight shifting x12 reps each side                 PT Short Term Goals - 04/19/21 2025       PT SHORT TERM GOAL #1   Title Pt will be independent with initial HEP in order to build upon functional gains made in therapy. ALL STGS DUE 05/16/21    Baseline currently dependent    Time 4    Period Weeks    Status New    Target Date 05/16/21      PT SHORT TERM GOAL #2   Title Pt will finish BERG assessment to determine fall risk with LTG written    Baseline 43/56 - LTG written    Time 4    Period Weeks    Status Achieved      PT SHORT TERM GOAL #3   Title Pt will decr TUG time to 17 seconds or less with no AD in order to demo decr fall risk.    Baseline 20.47 seconds    Time 4    Period Weeks    Status New      PT SHORT TERM GOAL #4   Title Pt will improve gait speed to at least 2.2 ft/sec with no AD vs. LRAD in order to demo decr fall risk.    Baseline 1.97 ft/sec    Time 4    Period Weeks    Status New      PT SHORT TERM GOAL #5   Title Pt will decr 5x sit <> stand time to 50 seconds or less in order to demo improved BLE strength and balance.    Baseline 64.32 seconds no UE support    Time 4     Period Weeks    Status New  PT Long Term Goals - 04/19/21 2026       PT LONG TERM GOAL #1   Title Pt will improve BERG score to at least a 48/56 in order to demo decr fall risk. ALL LTGS DUE 06/13/21    Baseline 43/56    Time 8    Period Weeks    Status Revised      PT LONG TERM GOAL #2   Title Pt will go ascend and descend 4 steps with single handrail vs. use of LRAD with supervision in order to be able to go to her son's house.    Baseline can not perform stairs    Time 8    Period Weeks    Status New      PT LONG TERM GOAL #3   Title Pt will decr 5x sit <> stand time to 40 seconds or less in order to demo improved BLE strength and balance.    Baseline 64.32 seconds no UE support    Time 8    Period Weeks    Status New      PT LONG TERM GOAL #4   Title Pt will ambulate at least 300' outdoors over unlevel paved surfaces with LRAD in order to demo improved community mobility.    Baseline not yet assessed.    Time 8    Period Weeks    Status New      PT LONG TERM GOAL #5   Title Pt will decr TUG time to 15 seconds or less with no AD in order to demo decr fall risk.    Baseline 20.47 seconds    Time 8    Period Weeks    Status New      PT LONG TERM GOAL #6   Title Pt will improve FOTO score to at least 60% in order to demo improved functional outcomes.    Baseline 50%    Time 8    Period Weeks    Status New                   Plan - 05/02/21 1029     Clinical Impression Statement Continued to focus on BLE strengthening, standing activity tolerance, and standing balance strategies. Pt more hesistant to put weight through LLE when doing standing balance with head motions. Needing seated rest breaks intermittently throughout session due to fatigue. Will continue to progress towards LTGs.    Personal Factors and Comorbidities Comorbidity 3+;Past/Current Experience    Comorbidities smoking, PAD, HTN, ETOH abuse, chronic hepatitis C with hepatic  coma,GAD, esophageal stricture    Examination-Activity Limitations Locomotion Level;Transfers;Stairs;Squat;Carry    Examination-Participation Restrictions Cleaning;Community Activity;Laundry    Stability/Clinical Decision Making Stable/Uncomplicated    Rehab Potential Good    PT Frequency 2x / week    PT Duration 8 weeks    PT Treatment/Interventions ADLs/Self Care Home Management;DME Instruction;Gait training;Stair training;Therapeutic activities;Functional mobility training;Therapeutic exercise;Balance training;Neuromuscular re-education;Orthotic Fit/Training;Patient/family education;Vestibular    PT Next Visit Plan continue to work on BLE strengthening, standing balance (work EC, narrow BOS, SLS tasks, head motions), sit <> stand training. might want to try foot up brace for RLE foot clearance esp when more fatigued.  Dynamic counter balance exercises.    PT Home Exercise Plan [facility]    Consulted and Agree with Plan of Care Patient             Patient will benefit from skilled therapeutic intervention in order to improve the following deficits and impairments:  Abnormal gait, Decreased activity tolerance, Decreased balance, Decreased coordination, Decreased endurance, Decreased knowledge of use of DME, Difficulty walking, Decreased strength, Dizziness, Impaired sensation, Postural dysfunction  Visit Diagnosis: Unsteadiness on feet  Muscle weakness (generalized)  Hemiplegia and hemiparesis following other cerebrovascular disease affecting right dominant side ([place])  Other symptoms and signs involving the nervous system     Problem List Patient Active Problem List   Diagnosis Date Noted   Acute CVA (cerebrovascular accident) ([place]) 12/29/2020   Peripheral arterial disease ([place]) 12/20/2020   History of esophageal stricture 07/08/2020   Insomnia 12/02/2017   Chronic cough 08/14/2017   Poor appetite 08/29/2016   Peptic stricture of esophagus    History of colonic polyps     Hiatal hernia 03/16/2016   Odynophagia 03/16/2016   Dysphagia 03/16/2016   Alcohol abuse 10/26/2015   Essential hypertension 09/26/2015   PAC (premature atrial contraction) 10/11/2014   PVC (premature ventricular contraction) 10/11/2014   Back muscle spasm 06/15/2014   Generalized anxiety disorder 06/15/2014   Smoking 06/15/2014   Chronic hepatitis C without hepatic coma ([place]) 05/28/2013    Arliss Journey, PT, DPT  05/02/2021, 10:30 AM  [facility] [address] [place], [place], [address] Phone: [phone]   Fax:  [phone]  Name: Kara Bailey MRN: [id number] Date of Birth: 1956-04-28

## 2021-05-02 NOTE — Patient Instructions (Signed)
**  Keep your therapy older in one spot. Make sure you bring it inside so you can work on your homework.   Practice your "belly breathing" 10 minutes twice a day. Breath in through your nose, breath out through your mouth. Focus on your belly moving in/out  Practice reading your sentences. Read slowly. Remember: breath in through your nose, speak out while exhaling. Don't hold your breath in.

## 2021-05-02 NOTE — Therapy (Signed)
[facility] [address] [place], [place], [address] Phone: [phone]   Fax:  [phone]  Speech Language Pathology Treatment  Patient Details  Name: Kara Bailey MRN: [id number] Date of Birth: 1956/08/22 Referring Provider (SLP): Cipriano Mile, NP   Encounter Date: 05/02/2021   End of Session - 05/02/21 1043     Visit Number 5    Number of Visits 17    Date for SLP Re-Evaluation 06/09/21    Authorization Type [employer]    Authorization Time Period 04-14-21 to 06-09-21 (9 visits)    SLP Start Time 1100    SLP Stop Time  0347    SLP Time Calculation (min) 45 min    Activity Tolerance Patient tolerated treatment well             Past Medical History:  Diagnosis Date   Anxiety    Chest pain 08/2014   unspecified   Cirrhosis ([place])    Depression    Dizziness 08/2014   Dyspnea 08/2014   Emphysema lung (H[facility])    GERD (gastroesophageal reflux disease)    Heart murmur    Hepatitis C    Hiatal hernia    Hypertension    PAC (premature atrial contraction) 10/11/2014   PAD (peripheral artery disease) (HCC)    Palpitations    PVC (premature ventricular contraction) 10/11/2014   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   Stroke ([place])    just slurred speech   Tachycardia 08/2014   Ventral hernia    Weakness 08/2014    Past Surgical History:  Procedure Laterality Date   APPENDECTOMY     CATARACT EXTRACTION, BILATERAL     COLONOSCOPY     COLONOSCOPY WITH PROPOFOL N/A 04/30/2016   Procedure: COLONOSCOPY WITH PROPOFOL;  Surgeon: Daneil Dolin, MD;  [facility];  Service: Endoscopy;  Laterality: N/A;  1230   ESOPHAGOGASTRODUODENOSCOPY     approximately 2010   ESOPHAGOGASTRODUODENOSCOPY (EGD) WITH PROPOFOL N/A 04/30/2016   Procedure: ESOPHAGOGASTRODUODENOSCOPY (EGD) WITH PROPOFOL;  Surgeon: Daneil Dolin, MD;  [facility];  Service: Endoscopy;  Laterality: N/A;   LEFT  HEART CATHETERIZATION WITH CORONARY ANGIOGRAM N/A 10/29/2014   07-14-20- pt denies this Procedure: LEFT HEART CATHETERIZATION WITH CORONARY ANGIOGRAM;  Surgeon: Burnell Blanks, MD;  Location: [facility] CATH LAB;  Service: Cardiovascular;  Laterality: N/A;   MALONEY DILATION N/A 04/30/2016   Procedure: Venia Minks DILATION;  Surgeon: Daneil Dolin, MD;  [facility];  Service: Endoscopy;  Laterality: N/A;   POLYPECTOMY  04/30/2016   Procedure: POLYPECTOMY;  Surgeon: Daneil Dolin, MD;  [facility];  Service: Endoscopy;;  Sigmoid colon polyp removed via hot snare   UPPER GASTROINTESTINAL ENDOSCOPY      There were no vitals filed for this visit.   Subjective Assessment - 05/02/21 1100     Subjective "I keep forgetting to do the (abdominal) breathing"    Currently in Pain? No/denies                   ADULT SLP TREATMENT - 05/02/21 1043       General Information   Behavior/Cognition Alert;Cooperative;Pleasant mood      Treatment Provided   Treatment provided Cognitive-Linquistic      Cognitive-Linquistic Treatment   Treatment focused on Dysarthria;Apraxia;Cognition;Aphasia    Skilled Treatment Pt forgot to complete weekly schedule HEP as recommended last session. Inconsistent practice with abdominal breathing reported due to recall and  comprehension of speaking on exhalation. Further education provided re: abdominal breathing. Difficulty with timing and coordination for speaking on exhalation with intermittent breath hold noted. SLP administered MOCA this session for further analysis of cognitive linguistic functioning. Pt scored 18/30 indicating cognitive linguistic impairment. Reduced comprehension and recall of auditory information and instructions demonstrated. Pt benefited from 2nd repetition of information to improve comprehension and recall. Pt verbalized "I am still forgetting my morning meds even with the alarm." SLP modifed alarms. SLP provided HEP for  further training of abdominal breathing and rhyming words to target verbal/oral motor apraxia.      Assessment / Recommendations / Plan   Plan Continue with current plan of care;Other (Comment);Goals updated   added verbal apraxia diagnosis/goal     Progression Toward Goals   Progression toward goals Progressing toward goals              SLP Education - 05/02/21 1107     Education Details MOCA results    Person(s) Educated Patient    Methods Explanation;Demonstration;Handout    Comprehension Verbalized understanding;Returned demonstration;Need further instruction              SLP Short Term Goals - 05/02/21 1043       SLP SHORT TERM GOAL #1   Title Pt will demonstrate dysarthria compensations in 10 minute simple to mod complex conversation with min A over 2 sessions    Time 2    Period Weeks    Status On-going      SLP SHORT TERM GOAL #2   Title Pt will use memory/attention compensations for appointments, medicine management, and other daily activities with occasional min A over 3 sessions.    Time 2    Period Weeks    Status On-going      SLP SHORT TERM GOAL #3   Title Pt will ID and correct word finding errors with 80% accuracy given occasional min A over 2 sessions    Time 2    Period Weeks    Status On-going      SLP SHORT TERM GOAL #4   Title Pt will verbalize 3 safety concerns in the home and generate solutions to them with occasional min A over 2 sessions    Time 2    Period Weeks    Status On-going      SLP SHORT TERM GOAL #5   Title Pt will complete dysphagia HEP to optimize swallow function with occasional min A over 2 sessions    Time 2    Period Weeks    Status On-going      Additional Short Term Goals   Additional Short Term Goals Yes      SLP SHORT TERM GOAL #6   Title Pt will exhibit improved oral coordination on targeted apraxia tasks with occasional min A over 2 sessions    Time 2    Period Weeks    Status New               SLP Long Term Goals - 05/02/21 1044       SLP LONG TERM GOAL #1   Title Pt will demonstrate dysarthria compensations in 15 minute simple to mod complex conversation with min A over 2 sessions    Time 6    Period Weeks    Status On-going      SLP LONG TERM GOAL #2   Title Pt will use memory/attention compensations for appointments, medicine management, and other daily activities with  rare min A over 3 sessions.    Time 6    Period Weeks    Status On-going      SLP LONG TERM GOAL #3   Title Pt will ID and correct word finding errors with 95% accuracy given occasional min A over 2 sessions    Time 6    Period Weeks    Status On-going      SLP LONG TERM GOAL #4   Title Pt will report improved diet tolerance and reduced s/sx of aspiration with rare min over 2 sessions    Time 6    Period Weeks    Status On-going      SLP LONG TERM GOAL #5   Title Pt will undergo repeat objective swallow study as needed if s/sx of aspiration persist    Time 6    Period Weeks    Status On-going              Plan - 05/02/21 1044     Clinical Impression Statement [place] cont to present with cognitive communication, dysarthria, and swallowing s/p CVA in February 2022. Oral/verbal apraxia demonstrated over last few ST sessions as pt exhibited prolonged oral holding with liquids, reduced coordination for breath support and speaking, and slow labored speech/groping during speaking. POC updated to include verbal apraxia diagnosis and goal. MOCA adminstered this session, with score of 18/30 indicating cognitive linguistic impairment. Reduced comprehension and recall of instructions and information exhibited, which improved with repetition of information. Skilled ST is cont'd recommended to address speech, cognitive communication, and dysphagia impacting pt's overall safety and functional independence compared to baseline.    Speech Therapy Frequency 2x / week    Duration 8 weeks   or 17 total visits    Treatment/Interventions Aspiration precaution training;Oral motor exercises;Compensatory strategies;Pharyngeal strengthening exercises;Functional tasks;Patient/family education;Diet toleration management by SLP;Cognitive reorganization;Multimodal communcation approach;Language facilitation;Compensatory techniques;Internal/external aids;SLP instruction and feedback    Potential to Achieve Goals Fair    Potential Considerations Ability to learn/carryover information;Severity of impairments    Consulted and Agree with Plan of Care Patient             Patient will benefit from skilled therapeutic intervention in order to improve the following deficits and impairments:   Dysarthria and anarthria  Verbal apraxia  Cognitive communication deficit  Aphasia  Dysphagia, unspecified type    Problem List Patient Active Problem List   Diagnosis Date Noted   Acute CVA (cerebrovascular accident) ([place]) 12/29/2020   Peripheral arterial disease ([place]) 12/20/2020   History of esophageal stricture 07/08/2020   Insomnia 12/02/2017   Chronic cough 08/14/2017   Poor appetite 08/29/2016   Peptic stricture of esophagus    History of colonic polyps    Hiatal hernia 03/16/2016   Odynophagia 03/16/2016   Dysphagia 03/16/2016   Alcohol abuse 10/26/2015   Essential hypertension 09/26/2015   PAC (premature atrial contraction) 10/11/2014   PVC (premature ventricular contraction) 10/11/2014   Back muscle spasm 06/15/2014   Generalized anxiety disorder 06/15/2014   Smoking 06/15/2014   Chronic hepatitis C without hepatic coma ([place]) 05/28/2013    Alinda Deem, MA CCC-SLP 05/02/2021, 12:18 PM  [facility] [address] [place], [place], [address] Phone: [phone]   Fax:  [phone]   Name: PATIENCE NUZZO MRN: [id number] Date of Birth: [birth year]

## 2021-05-05 ENCOUNTER — Ambulatory Visit: Payer: Medicare HMO | Admitting: Occupational Therapy

## 2021-05-05 ENCOUNTER — Encounter: Payer: Self-pay | Admitting: Occupational Therapy

## 2021-05-05 ENCOUNTER — Other Ambulatory Visit: Payer: Self-pay

## 2021-05-05 ENCOUNTER — Ambulatory Visit: Payer: Medicare HMO

## 2021-05-05 ENCOUNTER — Encounter: Payer: Self-pay | Admitting: Physical Therapy

## 2021-05-05 ENCOUNTER — Ambulatory Visit: Payer: Medicare HMO | Admitting: Physical Therapy

## 2021-05-05 DIAGNOSIS — R471 Dysarthria and anarthria: Secondary | ICD-10-CM

## 2021-05-05 DIAGNOSIS — R2681 Unsteadiness on feet: Secondary | ICD-10-CM

## 2021-05-05 DIAGNOSIS — M6281 Muscle weakness (generalized): Secondary | ICD-10-CM

## 2021-05-05 DIAGNOSIS — R29818 Other symptoms and signs involving the nervous system: Secondary | ICD-10-CM

## 2021-05-05 DIAGNOSIS — R482 Apraxia: Secondary | ICD-10-CM

## 2021-05-05 DIAGNOSIS — R41841 Cognitive communication deficit: Secondary | ICD-10-CM

## 2021-05-05 DIAGNOSIS — R278 Other lack of coordination: Secondary | ICD-10-CM

## 2021-05-05 DIAGNOSIS — I69851 Hemiplegia and hemiparesis following other cerebrovascular disease affecting right dominant side: Secondary | ICD-10-CM

## 2021-05-05 DIAGNOSIS — R41840 Attention and concentration deficit: Secondary | ICD-10-CM

## 2021-05-05 NOTE — Patient Instructions (Signed)
Try 5 minutes at a time  Breathe in through your nose Breathe out through your mouth ("rounded lips"). Practice breathing into tissue  Say "ooo" into tissue   Breathe thru straw Hum thru straw (you want voice to consistent, not bumpy)  "Let it flow"  Practice your sentences. Breath it, "flow out." If you are tense, try again. Relax as much as you can to help it flow

## 2021-05-05 NOTE — Therapy (Signed)
[facility] [address] [place], [place], [address] Phone: [phone]   Fax:  [phone]  Speech Language Pathology Treatment  Patient Details  Name: Kara Bailey MRN: [id number] Date of Birth: 01-14-1956 Referring Provider (SLP): Cipriano Mile, NP   Encounter Date: 05/05/2021   End of Session - 05/05/21 0959     Visit Number 6    Number of Visits 17    Date for SLP Re-Evaluation 06/09/21    Authorization Type [employer]    Authorization Time Period 04-14-21 to 06-09-21 (9 visits)    SLP Start Time 0932    SLP Stop Time  7616    SLP Time Calculation (min) 43 min    Activity Tolerance Patient tolerated treatment well             Past Medical History:  Diagnosis Date   Anxiety    Chest pain 08/2014   unspecified   Cirrhosis ([place])    Depression    Dizziness 08/2014   Dyspnea 08/2014   Emphysema lung (HCC)    GERD (gastroesophageal reflux disease)    Heart murmur    Hepatitis C    Hiatal hernia    Hypertension    PAC (premature atrial contraction) 10/11/2014   PAD (peripheral artery disease) (HCC)    Palpitations    PVC (premature ventricular contraction) 10/11/2014   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   Stroke ([place])    just slurred speech   Tachycardia 08/2014   Ventral hernia    Weakness 08/2014    Past Surgical History:  Procedure Laterality Date   APPENDECTOMY     CATARACT EXTRACTION, BILATERAL     COLONOSCOPY     COLONOSCOPY WITH PROPOFOL N/A 04/30/2016   Procedure: COLONOSCOPY WITH PROPOFOL;  Surgeon: Daneil Dolin, MD;  [facility];  Service: Endoscopy;  Laterality: N/A;  1230   ESOPHAGOGASTRODUODENOSCOPY     approximately 2010   ESOPHAGOGASTRODUODENOSCOPY (EGD) WITH PROPOFOL N/A 04/30/2016   Procedure: ESOPHAGOGASTRODUODENOSCOPY (EGD) WITH PROPOFOL;  Surgeon: Daneil Dolin, MD;  [facility];  Service: Endoscopy;  Laterality: N/A;   LEFT  HEART CATHETERIZATION WITH CORONARY ANGIOGRAM N/A 10/29/2014   07-14-20- pt denies this Procedure: LEFT HEART CATHETERIZATION WITH CORONARY ANGIOGRAM;  Surgeon: Burnell Blanks, MD;  Location: [facility] CATH LAB;  Service: Cardiovascular;  Laterality: N/A;   MALONEY DILATION N/A 04/30/2016   Procedure: Venia Minks DILATION;  Surgeon: Daneil Dolin, MD;  [facility];  Service: Endoscopy;  Laterality: N/A;   POLYPECTOMY  04/30/2016   Procedure: POLYPECTOMY;  Surgeon: Daneil Dolin, MD;  [facility];  Service: Endoscopy;;  Sigmoid colon polyp removed via hot snare   UPPER GASTROINTESTINAL ENDOSCOPY      There were no vitals filed for this visit.   Subjective Assessment - 05/05/21 0934     Subjective "I am trying not to hold my breath"    Currently in Pain? No/denies                   ADULT SLP TREATMENT - 05/05/21 0934       General Information   Behavior/Cognition Alert;Cooperative;Pleasant mood      Treatment Provided   Treatment provided Cognitive-Linquistic      Cognitive-Linquistic Treatment   Treatment focused on Dysarthria;Apraxia;Patient/family/caregiver education    Skilled Treatment SLP reviewed abdominal breathing, with pt endorsing difficulty with not holding breath. With SLP modeling, pt required mod fading to  min cues to demo abdominal breathing at rest. Visual/tactile feedback via tissue and straw were effective to optimize breath support and promote carryover. SLP introduced SOVT exercises and flow phonation this session due to overt muscle tension exhibited during speaking and breathing tasks. Functional conversational sentences targeted, with occasional verbal cues to use abdominal breathing and optimize flowing speech. Increasing awareness of breath hold and muscle tension exhibited this session.      Assessment / Recommendations / Plan   Plan Continue with current plan of care      Progression Toward Goals   Progression toward goals  Progressing toward goals              SLP Education - 05/05/21 0957     Education Details SOVT, flow phonation, abdominal breathing    Person(s) Educated Patient    Methods Explanation;Demonstration    Comprehension Verbalized understanding;Returned demonstration;Need further instruction              SLP Short Term Goals - 05/05/21 1000       SLP SHORT TERM GOAL #1   Title Pt will demonstrate dysarthria compensations in 10 minute simple to mod complex conversation with min A over 2 sessions    Time 2    Period Weeks    Status On-going      SLP SHORT TERM GOAL #2   Title Pt will use memory/attention compensations for appointments, medicine management, and other daily activities with occasional min A over 3 sessions.    Time 2    Period Weeks    Status On-going      SLP SHORT TERM GOAL #3   Title Pt will ID and correct word finding errors with 80% accuracy given occasional min A over 2 sessions    Time 2    Period Weeks    Status On-going      SLP SHORT TERM GOAL #4   Title Pt will verbalize 3 safety concerns in the home and generate solutions to them with occasional min A over 2 sessions    Time 2    Period Weeks    Status On-going      SLP SHORT TERM GOAL #5   Title Pt will complete dysphagia HEP to optimize swallow function with occasional min A over 2 sessions    Time 2    Period Weeks    Status On-going      SLP SHORT TERM GOAL #6   Title Pt will exhibit improved oral coordination on targeted apraxia tasks with occasional min A over 2 sessions    Time 2    Period Weeks    Status New              SLP Long Term Goals - 05/05/21 1000       SLP LONG TERM GOAL #1   Title Pt will demonstrate dysarthria compensations in 15 minute simple to mod complex conversation with min A over 2 sessions    Time 6    Period Weeks    Status On-going      SLP LONG TERM GOAL #2   Title Pt will use memory/attention compensations for appointments, medicine  management, and other daily activities with rare min A over 3 sessions.    Time 6    Period Weeks    Status On-going      SLP LONG TERM GOAL #3   Title Pt will ID and correct word finding errors with 95% accuracy given occasional min A  over 2 sessions    Time 6    Period Weeks    Status On-going      SLP LONG TERM GOAL #4   Title Pt will report improved diet tolerance and reduced s/sx of aspiration with rare min over 2 sessions    Time 6    Period Weeks    Status On-going      SLP LONG TERM GOAL #5   Title Pt will undergo repeat objective swallow study as needed if s/sx of aspiration persist    Time 6    Period Weeks    Status On-going              Plan - 05/05/21 1030     Clinical Impression Statement Lindaann cont to present with changes in speech, cognitive communication, and swallowing s/p CVA in February 2022. Ongoing training and instruction of abdominal breathing completed this session. SOVTE and flow phonation introduced this session due to overt muscle tension impacting pt's clarity and fluency of speech. Skilled ST is cont'd recommended to address speech, cognitive communication, and dysphagia impacting pt's overall safety and functional independence compared to baseline.    Speech Therapy Frequency 2x / week    Duration 8 weeks   or 17 total visits   Treatment/Interventions Aspiration precaution training;Oral motor exercises;Compensatory strategies;Pharyngeal strengthening exercises;Functional tasks;Patient/family education;Diet toleration management by SLP;Cognitive reorganization;Multimodal communcation approach;Language facilitation;Compensatory techniques;Internal/external aids;SLP instruction and feedback    Potential to Achieve Goals Fair    Potential Considerations Ability to learn/carryover information;Severity of impairments    SLP Home Exercise Plan provided    Consulted and Agree with Plan of Care Patient             Patient will benefit from skilled  therapeutic intervention in order to improve the following deficits and impairments:   Verbal apraxia  Dysarthria and anarthria  Cognitive communication deficit    Problem List Patient Active Problem List   Diagnosis Date Noted   Acute CVA (cerebrovascular accident) ([place]) 12/29/2020   Peripheral arterial disease ([place]) 12/20/2020   History of esophageal stricture 07/08/2020   Insomnia 12/02/2017   Chronic cough 08/14/2017   Poor appetite 08/29/2016   Peptic stricture of esophagus    History of colonic polyps    Hiatal hernia 03/16/2016   Odynophagia 03/16/2016   Dysphagia 03/16/2016   Alcohol abuse 10/26/2015   Essential hypertension 09/26/2015   PAC (premature atrial contraction) 10/11/2014   PVC (premature ventricular contraction) 10/11/2014   Back muscle spasm 06/15/2014   Generalized anxiety disorder 06/15/2014   Smoking 06/15/2014   Chronic hepatitis C without hepatic coma ([place]) 05/28/2013    Alinda Deem, MA CCC-SLP 05/05/2021, 10:32 AM  [facility] [address] [place], [place], [address] Phone: [phone]   Fax:  [phone]   Name: Kara Bailey MRN: [id number] Date of Birth: [birth year]

## 2021-05-05 NOTE — Therapy (Signed)
[facility] [address] [place], [place], [address] Phone: [phone]   Fax:  [phone]  Occupational Therapy Treatment  Patient Details  Name: Kara Bailey MRN: [id number] Date of Birth: December 11, 1955 Referring Provider (OT): Cipriano Mile, NP   Encounter Date: 05/05/2021   OT End of Session - 05/05/21 1018     Visit Number 7    Number of Visits 9    Date for OT Re-Evaluation 05/26/21   anticipate only 4 weeks   Authorization Type [employer] Medicare/Med[employer]    Authorization Time Period Covered 100%    Authorization - Visit Number 6    Authorization - Number of Visits 10    Progress Note Due on Visit 10    OT Start Time 1018    OT Stop Time 1100    OT Time Calculation (min) 42 min    Activity Tolerance Patient tolerated treatment well    Behavior During Therapy [facility] for tasks assessed/performed             Past Medical History:  Diagnosis Date   Anxiety    Chest pain 08/2014   unspecified   Cirrhosis (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   Stroke ([place])    just slurred speech   Tachycardia 08/2014   Ventral hernia    Weakness 08/2014    Past Surgical History:  Procedure Laterality Date   APPENDECTOMY     CATARACT EXTRACTION, BILATERAL     COLONOSCOPY     COLONOSCOPY WITH PROPOFOL N/A 04/30/2016   Procedure: COLONOSCOPY WITH PROPOFOL;  Surgeon: Daneil Dolin, MD;  [facility];  Service: Endoscopy;  Laterality: N/A;  1230   ESOPHAGOGASTRODUODENOSCOPY     approximately 2010   ESOPHAGOGASTRODUODENOSCOPY (EGD) WITH PROPOFOL  N/A 04/30/2016   Procedure: ESOPHAGOGASTRODUODENOSCOPY (EGD) WITH PROPOFOL;  Surgeon: Daneil Dolin, MD;  [facility];  Service: Endoscopy;  Laterality: N/A;   LEFT HEART CATHETERIZATION WITH CORONARY ANGIOGRAM N/A 10/29/2014   07-14-20- pt denies this Procedure: LEFT HEART CATHETERIZATION WITH CORONARY ANGIOGRAM;  Surgeon: Burnell Blanks, MD;  Location: [facility] CATH LAB;  Service: Cardiovascular;  Laterality: N/A;   MALONEY DILATION N/A 04/30/2016   Procedure: Venia Minks DILATION;  Surgeon: Daneil Dolin, MD;  [facility];  Service: Endoscopy;  Laterality: N/A;   POLYPECTOMY  04/30/2016   Procedure: POLYPECTOMY;  Surgeon: Daneil Dolin, MD;  [facility];  Service: Endoscopy;;  Sigmoid colon polyp removed via hot snare   UPPER GASTROINTESTINAL ENDOSCOPY      There were no vitals filed for this visit.     Pt reports things are going easier with coordination and has been cutting vegetables well at home.   Physical Cognitive required max assistance and increased time for completing walking and tossing ball at the same time. After repetitions and taking it step by step pt was able to continue doing these two simultaneously. OT added reciting ABCs while completing physical tasks with 1 error and increased time.   Tanagram Puzzles 1-6 with mod  cues and needing to place directly on image. Pt unable to do near point copying without directly placing. Pt with repetition able to do puzzles 4-6 with near point copying and with min difficulty. Increased time for puzzle 6 and with mod cues for attending to spatial awareness and problem solving.  Constant Therapy Alternating Symbols level 4 - pt did not receive score d/t not finishing d/t time. Pt required increased time and min cues for attending and following directions but overall did well.                    OT Long Term Goals - 04/28/21 0924       OT LONG TERM GOAL #1   Title Pt will be independent  with HEP for coordination and grip strength    Time 4    Period Weeks    Status On-going      OT LONG TERM GOAL #2   Title Pt will improve grip strength, bilaterally, to a functional strength (35lbs or greater).    Baseline R 32.6, L 26.6    Time 4    Period Weeks    Status On-going      OT LONG TERM GOAL #3   Title Pt will perform physical and cognitive task simultaneously with 90% accuracy    Time 4    Period Weeks    Status On-going      OT LONG TERM GOAL #4   Title Pt will verbalize understanding of adapted strategies and/or equipment PRN to increase safety and independence with ADLs and IADLs (cutting vegetables, etc)    Time 4    Period Weeks    Status On-going   pt reports not having difficulty with cutting vegetables anymore at home 04/19/21                  Plan - 05/05/21 1048     Clinical Impression Statement Pt is progressing towards goals. Pt with increased difficulty with alternating attention and motor planning this day.    OT Occupational Profile and History Problem Focused Assessment - Including review of records relating to presenting problem    Occupational performance deficits (Please refer to evaluation for details): ADL's;IADL's    Body Structure / Function / Physical Skills ADL;FMC;Coordination;Decreased knowledge of use of DME;Dexterity;GMC;Strength;UE functional use;Vision;IADL    Cognitive Skills Attention;Sequencing;Safety Awareness;Problem Solve    Rehab Potential Good    Clinical Decision Making Limited treatment options, no task modification necessary    Comorbidities Affecting Occupational Performance: None    Modification or Assistance to Complete Evaluation  No modification of tasks or assist necessary to complete eval    OT Frequency 2x / week    OT Duration 6 weeks   8 visits - anticipate over 4 weeks but may take 6 weeks.   OT Treatment/Interventions Self-care/ADL training;Neuromuscular education;Energy conservation;Functional Mobility  Training;Patient/family education;Cognitive remediation/compensation;Therapeutic exercise;Therapeutic activities;DME and/or AE instruction;Moist Heat;Fluidtherapy;Visual/perceptual remediation/compensation    Plan RUE coordination activities, environmental scanning with cog or phys component.    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)  Muscle weakness (generalized)  Other symptoms and signs involving the nervous system  Attention and concentration deficit  Other lack of coordination    Problem List Patient Active Problem List   Diagnosis Date Noted   Acute CVA (cerebrovascular accident) ([place]) 12/29/2020   Peripheral arterial disease ([place]) 12/20/2020   History of esophageal stricture 07/08/2020   Insomnia 12/02/2017   Chronic cough 08/14/2017   Poor appetite 08/29/2016   Peptic stricture of esophagus    History of colonic polyps    Hiatal hernia 03/16/2016   Odynophagia 03/16/2016   Dysphagia 03/16/2016   Alcohol abuse 10/26/2015   Essential hypertension 09/26/2015   PAC (premature atrial contraction) 10/11/2014   PVC (premature ventricular contraction) 10/11/2014   Back muscle spasm 06/15/2014   Generalized anxiety disorder 06/15/2014   Smoking 06/15/2014   Chronic hepatitis C without hepatic coma ([place]) 05/28/2013    Zachery Conch MOT, OTR/L  05/05/2021, 11:33 AM  [facility] [address] [place], [place], [address] Phone: [phone]   Fax:  [phone]  Name: Kara Bailey MRN: [id number] Date of Birth: 12-08-1955

## 2021-05-05 NOTE — Therapy (Signed)
[facility] [address] [place], [place], [address] Phone: [phone]   Fax:  [phone]  Physical Therapy Treatment  Patient Details  Name: Kara Bailey MRN: [id number] Date of Birth: 01/29/1956 Referring Provider (PT): Cipriano Mile, NP   Encounter Date: 05/05/2021   PT End of Session - 05/05/21 1152     Visit Number 6    Number of Visits 17    Date for PT Re-Evaluation 07/17/21   written for 60 day POC   Authorization Type [employer] - auth from 6/7-07/17/21    PT Start Time 1101    PT Stop Time 1142    PT Time Calculation (min) 41 min    Equipment Utilized During Treatment Gait belt    Activity Tolerance Patient tolerated treatment well    Behavior During Therapy [facility] for tasks assessed/performed             Past Medical History:  Diagnosis Date   Anxiety    Chest pain 08/2014   unspecified   Cirrhosis ([place])    Depression    Dizziness 08/2014   Dyspnea 08/2014   Emphysema lung ([place])    GERD (gastroesophageal reflux disease)    Heart murmur    Hepatitis C    Hiatal hernia    Hypertension    PAC (premature atrial contraction) 10/11/2014   PAD (peripheral artery disease) (HCC)    Palpitations    PVC (premature ventricular contraction) 10/11/2014   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   Stroke ([place])    just slurred speech   Tachycardia 08/2014   Ventral hernia    Weakness 08/2014    Past Surgical History:  Procedure Laterality Date   APPENDECTOMY     CATARACT EXTRACTION, BILATERAL     COLONOSCOPY     COLONOSCOPY WITH PROPOFOL N/A 04/30/2016   Procedure: COLONOSCOPY WITH PROPOFOL;  Surgeon: Daneil Dolin, MD;  [facility];  Service: Endoscopy;  Laterality: N/A;  1230   ESOPHAGOGASTRODUODENOSCOPY     approximately 2010   ESOPHAGOGASTRODUODENOSCOPY (EGD) WITH PROPOFOL N/A 04/30/2016   Procedure: ESOPHAGOGASTRODUODENOSCOPY (EGD) WITH PROPOFOL;  Surgeon: Daneil Dolin, MD;  [facility];  Service: Endoscopy;  Laterality: N/A;   LEFT HEART CATHETERIZATION WITH CORONARY ANGIOGRAM N/A 10/29/2014   07-14-20- pt denies this Procedure: LEFT HEART CATHETERIZATION WITH CORONARY ANGIOGRAM;  Surgeon: Burnell Blanks, MD;  Location: [facility] CATH LAB;  Service: Cardiovascular;  Laterality: N/A;   MALONEY DILATION N/A 04/30/2016   Procedure: Venia Minks DILATION;  Surgeon: Daneil Dolin, MD;  [facility];  Service: Endoscopy;  Laterality: N/A;   POLYPECTOMY  04/30/2016   Procedure: POLYPECTOMY;  Surgeon: Daneil Dolin, MD;  [facility];  Service: Endoscopy;;  Sigmoid colon polyp removed via hot snare   UPPER GASTROINTESTINAL ENDOSCOPY      There were no vitals filed for this visit.   Subjective Assessment - 05/05/21 1104     Subjective Had to help out with her son with chores and reports that it went well.    Pertinent History smoking, PAD, HTN, ETOH abuse, chronic hepatitis C with hepatic coma,GAD, esophageal stricture    Limitations Walking;Standing    Patient Stated Goals wants to work on walking normally    Currently in Pain? No/denies  [facility] PT Treatment/Exercise - 05/05/21 1107       Knee/Hip Exercises: Aerobic   Stepper Seated SciFit Stepper with BLE/BUE at gear 1.2 for strengthening/ROM/activity tolerance for 5 minutes      Knee/Hip Exercises: Seated   Long Arc Quad Strengthening;AROM;Both;1 set;10 reps    [employer] Limitations cues for 3 second hold at end range      Knee/Hip Exercises: Supine   Bridges Strengthening;AROM;5 reps    Bridges Limitations with use of yellow tband around thighs    Other Supine Knee/Hip Exercises with yellow tband around thighs: x10 reps bent knee fall outs B, with cues for technique                 Balance Exercises - 05/05/21 1131       Balance Exercises: Standing   Rockerboard Lateral;EO;Limitations     Rockerboard Limitations lower profile rockerboard -weight shifting x15 reps beginning with UE support and then none, cues for technique and use of mirror as visual cue, keeping board steady - x5 reps head turns, x5 reps head nods, cues to keep soft knees    Step Ups 4 inch;Forward;UE support 2;Limitations    Step Ups Limitations 2 x 5 reps B, cues for sequencing and weight shifting   Other Standing Exercises with BUE support: x10 reps stepping over 2" foam beam and weight shift then back to middle, tactile and verbal cues esp for LLE                 PT Short Term Goals - 04/19/21 2025       PT SHORT TERM GOAL #1   Title Pt will be independent with initial HEP in order to build upon functional gains made in therapy. ALL STGS DUE 05/16/21    Baseline currently dependent    Time 4    Period Weeks    Status New    Target Date 05/16/21      PT SHORT TERM GOAL #2   Title Pt will finish BERG assessment to determine fall risk with LTG written    Baseline 43/56 - LTG written    Time 4    Period Weeks    Status Achieved      PT SHORT TERM GOAL #3   Title Pt will decr TUG time to 17 seconds or less with no AD in order to demo decr fall risk.    Baseline 20.47 seconds    Time 4    Period Weeks    Status New      PT SHORT TERM GOAL #4   Title Pt will improve gait speed to at least 2.2 ft/sec with no AD vs. LRAD in order to demo decr fall risk.    Baseline 1.97 ft/sec    Time 4    Period Weeks    Status New      PT SHORT TERM GOAL #5   Title Pt will decr 5x sit <> stand time to 50 seconds or less in order to demo improved BLE strength and balance.    Baseline 64.32 seconds no UE support    Time 4    Period Weeks    Status New               PT Long Term Goals - 04/19/21 2026       PT LONG TERM GOAL #1   Title Pt will improve BERG score to at least a 48/56 in order to demo decr  fall risk. ALL LTGS DUE 06/13/21    Baseline 43/56    Time 8    Period Weeks    Status  Revised      PT LONG TERM GOAL #2   Title Pt will go ascend and descend 4 steps with single handrail vs. use of LRAD with supervision in order to be able to go to her son's house.    Baseline can not perform stairs    Time 8    Period Weeks    Status New      PT LONG TERM GOAL #3   Title Pt will decr 5x sit <> stand time to 40 seconds or less in order to demo improved BLE strength and balance.    Baseline 64.32 seconds no UE support    Time 8    Period Weeks    Status New      PT LONG TERM GOAL #4   Title Pt will ambulate at least 300' outdoors over unlevel paved surfaces with LRAD in order to demo improved community mobility.    Baseline not yet assessed.    Time 8    Period Weeks    Status New      PT LONG TERM GOAL #5   Title Pt will decr TUG time to 15 seconds or less with no AD in order to demo decr fall risk.    Baseline 20.47 seconds    Time 8    Period Weeks    Status New      PT LONG TERM GOAL #6   Title Pt will improve FOTO score to at least 60% in order to demo improved functional outcomes.    Baseline 50%    Time 8    Period Weeks    Status New                   Plan - 05/05/21 1154     Clinical Impression Statement Continued to focus on BLE strengthening, standing activity tolerance, and balance. When performing step ups, initially more heavily reliant on BUE to stand when stepping up with LLE, but pt able to improve weight shift with incr reps. performed the low profile rockerboard today with pt able to perform without UE support in the medial/lateral direction for weight shifting. Less rest breaks needed today. Will continue to progress towards LTGs.    Personal Factors and Comorbidities Comorbidity 3+;Past/Current Experience    Comorbidities smoking, PAD, HTN, ETOH abuse, chronic hepatitis C with hepatic coma,GAD, esophageal stricture    Examination-Activity Limitations Locomotion Level;Transfers;Stairs;Squat;Carry    Examination-Participation  Restrictions Cleaning;Community Activity;Laundry    Stability/Clinical Decision Making Stable/Uncomplicated    Rehab Potential Good    PT Frequency 2x / week    PT Duration 8 weeks    PT Treatment/Interventions ADLs/Self Care Home Management;DME Instruction;Gait training;Stair training;Therapeutic activities;Functional mobility training;Therapeutic exercise;Balance training;Neuromuscular re-education;Orthotic Fit/Training;Patient/family education;Vestibular    PT Next Visit Plan continue to work on BLE strengthening, standing balance (work EC, narrow BOS, SLS tasks, head motions), sit <> stand training. might want to try foot up brace for RLE foot clearance esp when more fatigued.  Dynamic counter balance exercises.    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)  Muscle weakness (generalized)  Other symptoms and signs involving the nervous system     Problem List Patient Active Problem List   Diagnosis Date Noted   Acute CVA (cerebrovascular accident) ([place]) 12/29/2020   Peripheral arterial disease ([place]) 12/20/2020   History of esophageal stricture 07/08/2020   Insomnia 12/02/2017   Chronic cough 08/14/2017   Poor appetite 08/29/2016   Peptic stricture of esophagus    History of colonic polyps    Hiatal hernia 03/16/2016   Odynophagia 03/16/2016   Dysphagia 03/16/2016   Alcohol abuse 10/26/2015   Essential hypertension 09/26/2015   PAC  (premature atrial contraction) 10/11/2014   PVC (premature ventricular contraction) 10/11/2014   Back muscle spasm 06/15/2014   Generalized anxiety disorder 06/15/2014   Smoking 06/15/2014   Chronic hepatitis C without hepatic coma ([place]) 05/28/2013    Arliss Journey, PT, DPT  05/05/2021, 11:56 AM  [facility] [address] [place], [place], [address] Phone: [phone]   Fax:  [phone]  Name: Kara Bailey MRN: [id number] Date of Birth: [birth year]

## 2021-05-09 ENCOUNTER — Ambulatory Visit: Payer: Medicare HMO

## 2021-05-09 ENCOUNTER — Other Ambulatory Visit: Payer: Self-pay

## 2021-05-09 ENCOUNTER — Ambulatory Visit: Payer: Medicare HMO | Admitting: Physical Therapy

## 2021-05-09 ENCOUNTER — Encounter: Payer: Self-pay | Admitting: Physical Therapy

## 2021-05-09 ENCOUNTER — Ambulatory Visit: Payer: Medicare HMO | Admitting: Occupational Therapy

## 2021-05-09 DIAGNOSIS — M6281 Muscle weakness (generalized): Secondary | ICD-10-CM

## 2021-05-09 DIAGNOSIS — R2681 Unsteadiness on feet: Secondary | ICD-10-CM

## 2021-05-09 DIAGNOSIS — R29818 Other symptoms and signs involving the nervous system: Secondary | ICD-10-CM

## 2021-05-09 DIAGNOSIS — R471 Dysarthria and anarthria: Secondary | ICD-10-CM

## 2021-05-09 DIAGNOSIS — R278 Other lack of coordination: Secondary | ICD-10-CM | POA: Diagnosis not present

## 2021-05-09 DIAGNOSIS — I69851 Hemiplegia and hemiparesis following other cerebrovascular disease affecting right dominant side: Secondary | ICD-10-CM

## 2021-05-09 DIAGNOSIS — R482 Apraxia: Secondary | ICD-10-CM

## 2021-05-09 DIAGNOSIS — R41840 Attention and concentration deficit: Secondary | ICD-10-CM

## 2021-05-09 DIAGNOSIS — R41841 Cognitive communication deficit: Secondary | ICD-10-CM

## 2021-05-09 NOTE — Patient Instructions (Signed)
Breathing Exercises -breath in through nose for 3-5 seconds, breath out through mouth for 3-5 seconds -breath in through nose, say "ahhhhhh" as long as you can -Breath in through nose,  say "oooooo" as long as you can -Breath in through nose, say "eeeee" as long as you can   STRAW -breath in through nose, breath out through straw  -hum "mmm" through straw

## 2021-05-09 NOTE — Therapy (Signed)
[facility] [address] [place], [place], [address] Phone: [phone]   Fax:  [phone]  Speech Language Pathology Treatment  Patient Details  Name: Kara Bailey MRN: [id number] Date of Birth: [birth year] Referring Provider (SLP): Cipriano Mile, NP   Encounter Date: 05/09/2021   End of Session - 05/09/21 1156     Visit Number 7    Number of Visits 17    Date for SLP Re-Evaluation 06/09/21    Authorization Type [employer]    Authorization Time Period 04-14-21 to 06-09-21 (9 visits)    SLP Start Time 1147    SLP Stop Time  23    SLP Time Calculation (min) 43 min    Activity Tolerance Patient tolerated treatment well             Past Medical History:  Diagnosis Date   Anxiety    Chest pain 08/2014   unspecified   Cirrhosis ([place])    Depression    Dizziness 08/2014   Dyspnea 08/2014   Emphysema lung (HCC)    GERD (gastroesophageal reflux disease)    Heart murmur    Hepatitis C    Hiatal hernia    Hypertension    PAC (premature atrial contraction) 10/11/2014   PAD (peripheral artery disease) (HCC)    Palpitations    PVC (premature ventricular contraction) 10/11/2014   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   Stroke ([place])    just slurred speech   Tachycardia 08/2014   Ventral hernia    Weakness 08/2014    Past Surgical History:  Procedure Laterality Date   APPENDECTOMY     CATARACT EXTRACTION, BILATERAL     COLONOSCOPY     COLONOSCOPY WITH PROPOFOL N/A 04/30/2016   Procedure: COLONOSCOPY WITH PROPOFOL;  Surgeon: Daneil Dolin, MD;  [facility];  Service: Endoscopy;  Laterality: N/A;  1230   ESOPHAGOGASTRODUODENOSCOPY     approximately 2010   ESOPHAGOGASTRODUODENOSCOPY (EGD) WITH PROPOFOL N/A 04/30/2016   Procedure: ESOPHAGOGASTRODUODENOSCOPY (EGD) WITH PROPOFOL;  Surgeon: Daneil Dolin, MD;  [facility];  Service: Endoscopy;  Laterality: N/A;   LEFT  HEART CATHETERIZATION WITH CORONARY ANGIOGRAM N/A 10/29/2014   07-14-20- pt denies this Procedure: LEFT HEART CATHETERIZATION WITH CORONARY ANGIOGRAM;  Surgeon: Burnell Blanks, MD;  Location: [facility] CATH LAB;  Service: Cardiovascular;  Laterality: N/A;   MALONEY DILATION N/A 04/30/2016   Procedure: Venia Minks DILATION;  Surgeon: Daneil Dolin, MD;  [facility];  Service: Endoscopy;  Laterality: N/A;   POLYPECTOMY  04/30/2016   Procedure: POLYPECTOMY;  Surgeon: Daneil Dolin, MD;  [facility];  Service: Endoscopy;;  Sigmoid colon polyp removed via hot snare   UPPER GASTROINTESTINAL ENDOSCOPY      There were no vitals filed for this visit.   Subjective Assessment - 05/09/21 1149     Subjective "I'm still having trouble with breathing and speaking"    Currently in Pain? No/denies                   ADULT SLP TREATMENT - 05/09/21 1149       General Information   Behavior/Cognition Alert;Cooperative;Pleasant mood      Treatment Provided   Treatment provided Cognitive-Linquistic      Cognitive-Linquistic Treatment   Treatment focused on Dysarthria;Apraxia;Patient/family/caregiver education    Skilled Treatment SLP targeted abdominal breathing, sustained phonation, flow phonation, and SOVTE, with consistent moderate cues required to reduce tension, optimize breath  support, and coordinate timing for speaking on exhalation. Intermittent SOB exhibited, which improved with frequent rest breaks. Pt noted to be frustrated by inconsistent performance for breath support exercises, in which SLP provided simplified handout of breathing exercises to practice at home. SLP also questions possible cognitive component and apraxia's impact on comprehension and carryover of exercises, given need for consistent modeling and re-education.      Assessment / Recommendations / Plan   Plan Continue with current plan of care      Progression Toward Goals   Progression toward goals  Progressing toward goals   slow progression             SLP Education - 05/09/21 1423     Education Details breathing exercises, flow phonation, SOVT    Person(s) Educated Patient    Methods Explanation;Demonstration;Handout    Comprehension Verbalized understanding;Returned demonstration;Need further instruction              SLP Short Term Goals - 05/09/21 1424       SLP SHORT TERM GOAL #1   Title Pt will demonstrate dysarthria compensations in 10 minute simple to mod complex conversation with min A over 2 sessions    Time 1    Period Weeks    Status On-going      SLP SHORT TERM GOAL #2   Title Pt will use memory/attention compensations for appointments, medicine management, and other daily activities with occasional min A over 3 sessions.    Time 1    Period Weeks    Status On-going      SLP SHORT TERM GOAL #3   Title Pt will ID and correct word finding errors with 80% accuracy given occasional min A over 2 sessions    Time 1    Period Weeks    Status On-going      SLP SHORT TERM GOAL #4   Title Pt will verbalize 3 safety concerns in the home and generate solutions to them with occasional min A over 2 sessions    Time 1    Period Weeks    Status On-going      SLP SHORT TERM GOAL #5   Title Pt will complete dysphagia HEP to optimize swallow function with occasional min A over 2 sessions    Time 1    Period Weeks    Status On-going      SLP SHORT TERM GOAL #6   Title Pt will exhibit improved oral coordination on targeted apraxia tasks with occasional min A over 2 sessions    Time 1    Period Weeks    Status On-going              SLP Long Term Goals - 05/09/21 1424       SLP LONG TERM GOAL #1   Title Pt will demonstrate dysarthria compensations in 15 minute simple to mod complex conversation with min A over 2 sessions    Time 5    Period Weeks    Status On-going      SLP LONG TERM GOAL #2   Title Pt will use memory/attention compensations for  appointments, medicine management, and other daily activities with rare min A over 3 sessions.    Time 5    Period Weeks    Status On-going      SLP LONG TERM GOAL #3   Title Pt will ID and correct word finding errors with 95% accuracy given occasional min A over 2 sessions  Time 5    Period Weeks    Status On-going      SLP LONG TERM GOAL #4   Title Pt will report improved diet tolerance and reduced s/sx of aspiration with rare min over 2 sessions    Time 5    Period Weeks    Status On-going      SLP LONG TERM GOAL #5   Title Pt will undergo repeat objective swallow study as needed if s/sx of aspiration persist    Time 5    Period Weeks    Status On-going              Plan - 05/09/21 1425     Clinical Impression Statement Lerin cont to present with changes in speech, cognitive communication, and swallowing s/p CVA in February 2022. Ongoing training and instruction of abdominal breathing, SOVT, and flow phonation completed this session. Usual modeling and cues required to improve completion of exercises and reduce muscle tension impacting pt's clarity and fluency of speech. SLP questions possible cognitive and apraxic component related to inconsistent ability to perform breathing/speaking exercises. Skilled ST is cont'd recommended to address speech, cognitive communication, and dysphagia impacting pt's overall safety and functional independence compared to baseline.    Speech Therapy Frequency 2x / week    Duration 8 weeks   or 17 total visits   Treatment/Interventions Aspiration precaution training;Oral motor exercises;Compensatory strategies;Pharyngeal strengthening exercises;Functional tasks;Patient/family education;Diet toleration management by SLP;Cognitive reorganization;Multimodal communcation approach;Language facilitation;Compensatory techniques;Internal/external aids;SLP instruction and feedback    Potential to Achieve Goals Fair    Potential Considerations Ability  to learn/carryover information;Severity of impairments    SLP Home Exercise Plan provided    Consulted and Agree with Plan of Care Patient             Patient will benefit from skilled therapeutic intervention in order to improve the following deficits and impairments:   Verbal apraxia  Dysarthria and anarthria  Cognitive communication deficit    Problem List Patient Active Problem List   Diagnosis Date Noted   Acute CVA (cerebrovascular accident) ([place]) 12/29/2020   Peripheral arterial disease ([place]) 12/20/2020   History of esophageal stricture 07/08/2020   Insomnia 12/02/2017   Chronic cough 08/14/2017   Poor appetite 08/29/2016   Peptic stricture of esophagus    History of colonic polyps    Hiatal hernia 03/16/2016   Odynophagia 03/16/2016   Dysphagia 03/16/2016   Alcohol abuse 10/26/2015   Essential hypertension 09/26/2015   PAC (premature atrial contraction) 10/11/2014   PVC (premature ventricular contraction) 10/11/2014   Back muscle spasm 06/15/2014   Generalized anxiety disorder 06/15/2014   Smoking 06/15/2014   Chronic hepatitis C without hepatic coma ([place]) 05/28/2013    Alinda Deem, MA CCC-SLP 05/09/2021, 6:00 PM  [facility] [address] [place], [place], [address] Phone: [phone]   Fax:  [phone]   Name: Kara Bailey MRN: [id number] Date of Birth: October 20, 1956

## 2021-05-09 NOTE — Therapy (Signed)
[facility] [address] [place], [place], [address] Phone: [phone]   Fax:  [phone]  Occupational Therapy Treatment  Patient Details  Name: Kara Bailey MRN: [id number] Date of Birth: 1956-04-16 Referring Provider (OT): Cipriano Mile, NP   Encounter Date: 05/09/2021   OT End of Session - 05/09/21 1103     Visit Number 8    Number of Visits 9    Date for OT Re-Evaluation 05/26/21   anticipate only 4 weeks   Authorization Type [employer] Medicare/Medicaid    Authorization Time Period Covered 100%    Authorization - Visit Number 7    Authorization - Number of Visits 10    Progress Note Due on Visit 10    OT Start Time 1104    OT Stop Time 1145    OT Time Calculation (min) 41 min    Activity Tolerance Patient tolerated treatment well    Behavior During Therapy [facility] for tasks assessed/performed             Past Medical History:  Diagnosis Date   Anxiety    Chest pain 08/2014   unspecified   Cirrhosis ([place])    Depression    Dizziness 08/2014   Dyspnea 08/2014   Emphysema lung (HCC)    GERD (gastroesophageal reflux disease)    Heart murmur    Hepatitis C    Hiatal hernia    Hypertension    PAC (premature atrial contraction) 10/11/2014   PAD (peripheral artery disease) (HCC)    Palpitations    PVC (premature ventricular contraction) 10/11/2014   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   Stroke ([place])    just slurred speech   Tachycardia 08/2014   Ventral hernia    Weakness 08/2014    Past Surgical History:  Procedure Laterality Date   APPENDECTOMY     CATARACT EXTRACTION, BILATERAL     COLONOSCOPY     COLONOSCOPY WITH PROPOFOL N/A 04/30/2016   Procedure: COLONOSCOPY WITH PROPOFOL;  Surgeon: Daneil Dolin, MD;  [facility];  Service: Endoscopy;  Laterality: N/A;  1230   ESOPHAGOGASTRODUODENOSCOPY     approximately 2010   ESOPHAGOGASTRODUODENOSCOPY (EGD) WITH PROPOFOL  N/A 04/30/2016   Procedure: ESOPHAGOGASTRODUODENOSCOPY (EGD) WITH PROPOFOL;  Surgeon: Daneil Dolin, MD;  [facility];  Service: Endoscopy;  Laterality: N/A;   LEFT HEART CATHETERIZATION WITH CORONARY ANGIOGRAM N/A 10/29/2014   07-14-20- pt denies this Procedure: LEFT HEART CATHETERIZATION WITH CORONARY ANGIOGRAM;  Surgeon: Burnell Blanks, MD;  Location: [facility] CATH LAB;  Service: Cardiovascular;  Laterality: N/A;   MALONEY DILATION N/A 04/30/2016   Procedure: Venia Minks DILATION;  Surgeon: Daneil Dolin, MD;  [facility];  Service: Endoscopy;  Laterality: N/A;   POLYPECTOMY  04/30/2016   Procedure: POLYPECTOMY;  Surgeon: Daneil Dolin, MD;  [facility];  Service: Endoscopy;;  Sigmoid colon polyp removed via hot snare   UPPER GASTROINTESTINAL ENDOSCOPY      There were no vitals filed for this visit.   Constant Therapy Read a Map level 1 with 90% accuracy and 65.84s response time. Word that does not belong level 2 with 94% accuracy and 23.26s response time.  Grooved Pegs with RUE and LUE for increase in coordination in BUE. In hand manipulation - picking up a few and bringing one at a time to place in board.   Hand Gripper: with LUE on level 2 with black spring. Pt picked up 1 inch  blocks with gripper with max drops and max difficulty. After 7 blocks, downgraded to level 1 - completed with min difficulty and min drops.                            OT Long Term Goals - 04/28/21 0924       OT LONG TERM GOAL #1   Title Pt will be independent with HEP for coordination and grip strength    Time 4    Period Weeks    Status On-going      OT LONG TERM GOAL #2   Title Pt will improve grip strength, bilaterally, to a functional strength (35lbs or greater).    Baseline R 32.6, L 26.6    Time 4    Period Weeks    Status On-going      OT LONG TERM GOAL #3   Title Pt will perform physical and cognitive task simultaneously with 90%  accuracy    Time 4    Period Weeks    Status On-going      OT LONG TERM GOAL #4   Title Pt will verbalize understanding of adapted strategies and/or equipment PRN to increase safety and independence with ADLs and IADLs (cutting vegetables, etc)    Time 4    Period Weeks    Status On-going   pt reports not having difficulty with cutting vegetables anymore at home 04/19/21                  Plan - 05/09/21 1149     Clinical Impression Statement Pt continues to progress towards goals.    OT Occupational Profile and History Problem Focused Assessment - Including review of records relating to presenting problem    Occupational performance deficits (Please refer to evaluation for details): ADL's;IADL's    Body Structure / Function / Physical Skills ADL;FMC;Coordination;Decreased knowledge of use of DME;Dexterity;GMC;Strength;UE functional use;Vision;IADL    Cognitive Skills Attention;Sequencing;Safety Awareness;Problem Solve    Rehab Potential Good    Clinical Decision Making Limited treatment options, no task modification necessary    Comorbidities Affecting Occupational Performance: None    Modification or Assistance to Complete Evaluation  No modification of tasks or assist necessary to complete eval    OT Frequency 2x / week    OT Duration 6 weeks   8 visits - anticipate over 4 weeks but may take 6 weeks.   OT Treatment/Interventions Self-care/ADL training;Neuromuscular education;Energy conservation;Functional Mobility Training;Patient/family education;Cognitive remediation/compensation;Therapeutic exercise;Therapeutic activities;DME and/or AE instruction;Moist Heat;Fluidtherapy;Visual/perceptual remediation/compensation    Plan assess goals and determine discharge vs renewal next session.    Recommended Other Services get scheduled for PT (referral in Epic)    Consulted and Agree with Plan of Care Patient             Patient will benefit from skilled therapeutic intervention  in order to improve the following deficits and impairments:   Body Structure / Function / Physical Skills: ADL, FMC, Coordination, Decreased knowledge of use of DME, Dexterity, GMC, Strength, UE functional use, Vision, IADL Cognitive Skills: Attention, Sequencing, Safety Awareness, Problem Solve     Visit Diagnosis: Unsteadiness on feet  Attention and concentration deficit  Other lack of coordination  Muscle weakness (generalized)  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)  Other symptoms and signs involving the nervous system    Problem List Patient Active Problem List   Diagnosis Date Noted  Acute CVA (cerebrovascular accident) ([place]) 12/29/2020   Peripheral arterial disease ([place]) 12/20/2020   History of esophageal stricture 07/08/2020   Insomnia 12/02/2017   Chronic cough 08/14/2017   Poor appetite 08/29/2016   Peptic stricture of esophagus    History of colonic polyps    Hiatal hernia 03/16/2016   Odynophagia 03/16/2016   Dysphagia 03/16/2016   Alcohol abuse 10/26/2015   Essential hypertension 09/26/2015   PAC (premature atrial contraction) 10/11/2014   PVC (premature ventricular contraction) 10/11/2014   Back muscle spasm 06/15/2014   Generalized anxiety disorder 06/15/2014   Smoking 06/15/2014   Chronic hepatitis C without hepatic coma ([place]) 05/28/2013    Zachery Conch MOT, OTR/L  05/09/2021, 11:49 AM  [facility] [address] [place], [place], [address] Phone: [phone]   Fax:  [phone]  Name: Kara Bailey MRN: [id number] Date of Birth: [birth year]

## 2021-05-09 NOTE — Therapy (Signed)
[facility] [address] [place], [place], [address] Phone: [phone]   Fax:  [phone]  Physical Therapy Treatment  Patient Details  Name: Kara Bailey MRN: [id number] Date of Birth: Nov 14, 1955 Referring Provider (PT): Cipriano Mile, NP   Encounter Date: 05/09/2021   PT End of Session - 05/09/21 1101     Visit Number 7    Number of Visits 17    Date for PT Re-Evaluation 07/17/21   written for 60 day POC   Authorization Type [employer] - auth from 6/7-07/17/21    PT Start Time 1018    PT Stop Time 1058    PT Time Calculation (min) 40 min    Equipment Utilized During Treatment Gait belt    Activity Tolerance Patient tolerated treatment well    Behavior During Therapy [facility] for tasks assessed/performed             Past Medical History:  Diagnosis Date   Anxiety    Chest pain 08/2014   unspecified   Cirrhosis ([place])    Depression    Dizziness 08/2014   Dyspnea 08/2014   Emphysema lung (HCC)    GERD (gastroesophageal reflux disease)    Heart murmur    Hepatitis C    Hiatal hernia    Hypertension    PAC (premature atrial contraction) 10/11/2014   PAD (peripheral artery disease) (HCC)    Palpitations    PVC (premature ventricular contraction) 10/11/2014   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   Stroke ([place])    just slurred speech   Tachycardia 08/2014   Ventral hernia    Weakness 08/2014    Past Surgical History:  Procedure Laterality Date   APPENDECTOMY     CATARACT EXTRACTION, BILATERAL     COLONOSCOPY     COLONOSCOPY WITH PROPOFOL N/A 04/30/2016   Procedure: COLONOSCOPY WITH PROPOFOL;  Surgeon: Daneil Dolin, MD;  [facility];  Service: Endoscopy;  Laterality: N/A;  1230   ESOPHAGOGASTRODUODENOSCOPY     approximately 2010   ESOPHAGOGASTRODUODENOSCOPY (EGD) WITH PROPOFOL N/A 04/30/2016   Procedure: ESOPHAGOGASTRODUODENOSCOPY (EGD) WITH PROPOFOL;  Surgeon: Daneil Dolin, MD;  [facility];  Service: Endoscopy;  Laterality: N/A;   LEFT HEART CATHETERIZATION WITH CORONARY ANGIOGRAM N/A 10/29/2014   07-14-20- pt denies this Procedure: LEFT HEART CATHETERIZATION WITH CORONARY ANGIOGRAM;  Surgeon: Burnell Blanks, MD;  [facility] CATH LAB;  Service: Cardiovascular;  Laterality: N/A;   MALONEY DILATION N/A 04/30/2016   Procedure: Venia Minks DILATION;  Surgeon: Daneil Dolin, MD;  [facility];  Service: Endoscopy;  Laterality: N/A;   POLYPECTOMY  04/30/2016   Procedure: POLYPECTOMY;  Surgeon: Daneil Dolin, MD;  [facility];  Service: Endoscopy;;  Sigmoid colon polyp removed via hot snare   UPPER GASTROINTESTINAL ENDOSCOPY      There were no vitals filed for this visit.   Subjective Assessment - 05/09/21 1020     Subjective Reports she is getting stronger. Has been doing her exercises.    Pertinent History smoking, PAD, HTN, ETOH abuse, chronic hepatitis C with hepatic coma,GAD, esophageal stricture    Limitations Walking;Standing    Patient Stated Goals wants to work on walking normally    Currently in Pain? No/denies                [facility] PT Assessment - 05/09/21 1023       Timed Up and Go Test  Normal TUG (seconds) 16.18   no AD                          [facility] Adult PT Treatment/Exercise - 05/09/21 1023       Transfers   Transfers Sit to Stand;Stand to Sit    Sit to Stand 5: Supervision;Without upper extremity assist    Five time sit to stand comments  31.47 seconds without UE support from chair    Stand to Sit 5: Supervision;Without upper extremity assist    Comments x5 reps from mat table with cues to scoot out, foot placement, and incr forward weight shift, performed additional reps throughout session      Ambulation/Gait   Ambulation/Gait Yes    Ambulation/Gait Assistance 4: Min guard;5: Supervision    Ambulation/Gait Assistance Details cues for hip flexion with RLE as well  as R heel strike as pt initially ambulating into clinic with R circumduction. therapist providing assist for incr weight bearing through RLE.    Ambulation Distance (Feet) 100 Feet   x1, 70' x 1   Assistive device None    Gait Pattern Decreased arm swing - right;Step-through pattern;Decreased stance time - right;Decreased hip/knee flexion - right;Decreased dorsiflexion - right;Poor foot clearance - right;Lateral trunk lean to right    Ambulation Surface Level;Indoor    Stairs Yes    Stairs Assistance 4: Min guard;4: Min assist    Stairs Assistance Details (indicate cue type and reason) cues for proper sequencing - stepping up with stronger RLE and then down with weaker LLE. first rep performed with holding onto single handrail (with pt needing both hands on handrail) and min A when descending and 2nd with pt performing sideways with min guard. discussed if pt needs to perform stairs at her daughter or son's this weekend, make sure that she has someone with her    Stair Management Technique One rail Left;Step to pattern;Sideways;Forwards    Number of Stairs 4   x2   Height of Stairs 6                 Balance Exercises - 05/09/21 1043       Balance Exercises: Standing   Other Standing Exercises standing with single UE support; stepping over yardstick with RLE with focus on hip flexion/ankle DF and weight shifting forwards before stepping back to midline x10 reps, performed same activity with LLE (incr difficulty weight shifting forwards with LLE). with BUE support: staggered stance A/P weight shifting x10 reps each leg for additional active ankle PF/DF ROM               PT Education - 05/09/21 1101     Education Details progress towards goals, stair training.    Person(s) Educated Patient    Methods Explanation;Demonstration;Verbal cues    Comprehension Verbalized understanding;Returned demonstration;Need further instruction              PT Short Term Goals - 05/09/21  1022       PT SHORT TERM GOAL #1   Title Pt will be independent with initial HEP in order to build upon functional gains made in therapy. ALL STGS DUE 05/16/21    Baseline currently dependent    Time 4    Period Weeks    Status New    Target Date 05/16/21      PT SHORT TERM GOAL #2   Title Pt will finish BERG assessment to determine fall risk with  LTG written    Baseline 43/56 - LTG written    Time 4    Period Weeks    Status Achieved      PT SHORT TERM GOAL #3   Title Pt will decr TUG time to 17 seconds or less with no AD in order to demo decr fall risk.    Baseline 20.47 seconds; 16.18 ON 05/09/21    Time 4    Period Weeks    Status Achieved      PT SHORT TERM GOAL #4   Title Pt will improve gait speed to at least 2.2 ft/sec with no AD vs. LRAD in order to demo decr fall risk.    Baseline 1.97 ft/sec    Time 4    Period Weeks    Status New      PT SHORT TERM GOAL #5   Title Pt will decr 5x sit <> stand time to 50 seconds or less in order to demo improved BLE strength and balance.    Baseline 64.32 seconds no UE support; 31.47 seconds on 05/09/21    Time 4    Period Weeks    Status Achieved               PT Long Term Goals - 05/09/21 1023       PT LONG TERM GOAL #1   Title Pt will improve BERG score to at least a 48/56 in order to demo decr fall risk. ALL LTGS DUE 06/13/21    Baseline 43/56    Time 8    Period Weeks    Status Revised      PT LONG TERM GOAL #2   Title Pt will go ascend and descend 4 steps with single handrail vs. use of LRAD with supervision in order to be able to go to her son's house.    Baseline can not perform stairs    Time 8    Period Weeks    Status New      PT LONG TERM GOAL #3   Title Pt will decr 5x sit <> stand time to 25 seconds or less in order to demo improved BLE strength and balance.    Baseline 64.32 seconds no UE support; 31.47 seconds on 05/09/21 with no UE support    Time 8    Period Weeks    Status Revised      PT  LONG TERM GOAL #4   Title Pt will ambulate at least 300' outdoors over unlevel paved surfaces with LRAD in order to demo improved community mobility.    Baseline not yet assessed.    Time 8    Period Weeks    Status New      PT LONG TERM GOAL #5   Title Pt will decr TUG time to 13.5 seconds or less with no AD in order to demo decr fall risk.    Baseline 20.47 seconds; 16.18 ON 05/09/21    Time 8    Period Weeks    Status Revised      PT LONG TERM GOAL #6   Title Pt will improve FOTO score to at least 60% in order to demo improved functional outcomes.    Baseline 50%    Time 8    Period Weeks    Status New                   Plan - 05/09/21 1106     Clinical Impression Statement Began  to check STGs today with pt meeting STG #3 and #5 in regards to TUG and 5x sit <> stand. Pt improved TUG to 16.18 seconds with no AD (previously 20.47 seconds) and 5x sit <> stand without UE support to 31.47 seconds (previously 64.32 seconds). Began stair training today as pt will be going to her daughters/sons this weekend where they have stairs with a single handrail, pt did better performing sideways with step to pattern and min guard. Will benefit from continued practice. Will continue to progress towards LTGs.    Personal Factors and Comorbidities Comorbidity 3+;Past/Current Experience    Comorbidities smoking, PAD, HTN, ETOH abuse, chronic hepatitis C with hepatic coma,GAD, esophageal stricture    Examination-Activity Limitations Locomotion Level;Transfers;Stairs;Squat;Carry    Examination-Participation Restrictions Cleaning;Community Activity;Laundry    Stability/Clinical Decision Making Stable/Uncomplicated    Rehab Potential Good    PT Frequency 2x / week    PT Duration 8 weeks    PT Treatment/Interventions ADLs/Self Care Home Management;DME Instruction;Gait training;Stair training;Therapeutic activities;Functional mobility training;Therapeutic exercise;Balance training;Neuromuscular  re-education;Orthotic Fit/Training;Patient/family education;Vestibular    PT Next Visit Plan check remainder of goals. continue to work on BLE strengthening, standing balance (work EC, narrow BOS, SLS tasks, head motions), sit <> stand training. stair training. might wanna see how pt does with SPC. might want to try foot up brace for RLE foot clearance esp when more fatigued.    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)  Muscle weakness (generalized)  Other symptoms and signs involving the nervous system     Problem List Patient Active Problem List   Diagnosis Date Noted   Acute CVA (cerebrovascular accident) ([place]) 12/29/2020   Peripheral arterial disease ([place]) 12/20/2020   History of esophageal stricture 07/08/2020   Insomnia 12/02/2017   Chronic cough 08/14/2017   Poor appetite 08/29/2016   Peptic stricture of esophagus    History of colonic polyps    Hiatal hernia 03/16/2016   Odynophagia 03/16/2016   Dysphagia 03/16/2016   Alcohol abuse 10/26/2015   Essential hypertension 09/26/2015   PAC (premature atrial contraction) 10/11/2014   PVC (premature ventricular contraction) 10/11/2014   Back muscle spasm 06/15/2014   Generalized anxiety disorder 06/15/2014   Smoking 06/15/2014   Chronic hepatitis C without hepatic coma ([place]) 05/28/2013    Arliss Journey, PT, DPT  05/09/2021, 11:06 AM  [facility] [address] [name], [place], [address] Phone: [phone]   Fax:  [phone]  Name: KANA REIMANN MRN: [id number] Date of Birth: October 16, 1956

## 2021-05-12 ENCOUNTER — Ambulatory Visit: Payer: Medicare HMO

## 2021-05-12 ENCOUNTER — Ambulatory Visit: Payer: Medicare HMO | Attending: Student | Admitting: Occupational Therapy

## 2021-05-12 ENCOUNTER — Ambulatory Visit: Payer: Medicare HMO | Admitting: Physical Therapy

## 2021-05-12 ENCOUNTER — Encounter: Payer: Self-pay | Admitting: Occupational Therapy

## 2021-05-12 ENCOUNTER — Other Ambulatory Visit: Payer: Self-pay

## 2021-05-12 ENCOUNTER — Encounter: Payer: Self-pay | Admitting: Physical Therapy

## 2021-05-12 DIAGNOSIS — R482 Apraxia: Secondary | ICD-10-CM | POA: Insufficient documentation

## 2021-05-12 DIAGNOSIS — R2681 Unsteadiness on feet: Secondary | ICD-10-CM | POA: Diagnosis present

## 2021-05-12 DIAGNOSIS — I69851 Hemiplegia and hemiparesis following other cerebrovascular disease affecting right dominant side: Secondary | ICD-10-CM | POA: Insufficient documentation

## 2021-05-12 DIAGNOSIS — R4701 Aphasia: Secondary | ICD-10-CM

## 2021-05-12 DIAGNOSIS — R29818 Other symptoms and signs involving the nervous system: Secondary | ICD-10-CM

## 2021-05-12 DIAGNOSIS — R278 Other lack of coordination: Secondary | ICD-10-CM

## 2021-05-12 DIAGNOSIS — M6281 Muscle weakness (generalized): Secondary | ICD-10-CM

## 2021-05-12 DIAGNOSIS — R41840 Attention and concentration deficit: Secondary | ICD-10-CM | POA: Diagnosis present

## 2021-05-12 DIAGNOSIS — R471 Dysarthria and anarthria: Secondary | ICD-10-CM

## 2021-05-12 DIAGNOSIS — R1310 Dysphagia, unspecified: Secondary | ICD-10-CM | POA: Diagnosis present

## 2021-05-12 DIAGNOSIS — R41841 Cognitive communication deficit: Secondary | ICD-10-CM

## 2021-05-12 NOTE — Therapy (Signed)
[facility] [address] [place], [place], [address] Phone: [phone]   Fax:  [phone]  Speech Language Pathology Treatment  Patient Details  Name: Kara Bailey MRN: [id number] Date of Birth: Sep 01, 1956 Referring Provider (SLP): Cipriano Mile, NP   Encounter Date: 05/12/2021   End of Session - 05/12/21 1020     Visit Number 8    Number of Visits 17    Date for SLP Re-Evaluation 06/09/21    Authorization Type [employer]    Authorization Time Period 04-14-21 to 06-09-21 (9 visits)    SLP Start Time 1015    SLP Stop Time  1100    SLP Time Calculation (min) 45 min    Activity Tolerance Patient tolerated treatment well             Past Medical History:  Diagnosis Date   Anxiety    Chest pain 08/2014   unspecified   Cirrhosis ([place])    Depression    Dizziness 08/2014   Dyspnea 08/2014   Emphysema lung (HCC)    GERD (gastroesophageal reflux disease)    Heart murmur    Hepatitis C    Hiatal hernia    Hypertension    PAC (premature atrial contraction) 10/11/2014   PAD (peripheral artery disease) (HCC)    Palpitations    PVC (premature ventricular contraction) 10/11/2014   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   Stroke ([place])    just slurred speech   Tachycardia 08/2014   Ventral hernia    Weakness 08/2014    Past Surgical History:  Procedure Laterality Date   APPENDECTOMY     CATARACT EXTRACTION, BILATERAL     COLONOSCOPY     COLONOSCOPY WITH PROPOFOL N/A 04/30/2016   Procedure: COLONOSCOPY WITH PROPOFOL;  Surgeon: Daneil Dolin, MD;  [facility];  Service: Endoscopy;  Laterality: N/A;  1230   ESOPHAGOGASTRODUODENOSCOPY     approximately 2010   ESOPHAGOGASTRODUODENOSCOPY (EGD) WITH PROPOFOL N/A 04/30/2016   Procedure: ESOPHAGOGASTRODUODENOSCOPY (EGD) WITH PROPOFOL;  Surgeon: Daneil Dolin, MD;  [facility];  Service: Endoscopy;  Laterality: N/A;   LEFT  HEART CATHETERIZATION WITH CORONARY ANGIOGRAM N/A 10/29/2014   07-14-20- pt denies this Procedure: LEFT HEART CATHETERIZATION WITH CORONARY ANGIOGRAM;  Surgeon: Burnell Blanks, MD;  Location: [facility] CATH LAB;  Service: Cardiovascular;  Laterality: N/A;   MALONEY DILATION N/A 04/30/2016   Procedure: Venia Minks DILATION;  Surgeon: Daneil Dolin, MD;  [facility];  Service: Endoscopy;  Laterality: N/A;   POLYPECTOMY  04/30/2016   Procedure: POLYPECTOMY;  Surgeon: Daneil Dolin, MD;  [facility];  Service: Endoscopy;;  Sigmoid colon polyp removed via hot snare   UPPER GASTROINTESTINAL ENDOSCOPY      There were no vitals filed for this visit.   Subjective Assessment - 05/12/21 1017     Subjective "It's been easier" re: breathing exercises    Currently in Pain? Yes    Pain Score 6     Pain Location Knee    Pain Orientation Left                   ADULT SLP TREATMENT - 05/12/21 1123       General Information   Behavior/Cognition Alert;Cooperative;Pleasant mood      Treatment Provided   Treatment provided Cognitive-Linquistic      Cognitive-Linquistic Treatment   Treatment focused on Patient/family/caregiver education;Cognition;Aphasia;Dysarthria    Skilled Treatment Pt reports apraxia/dysarthria  HEP for breathing exercises to coordinate/maximize breath support for speaking has gotten "easier." SLP focused this session on cognition. Pt is now successfully using alarms to take medications per SLP recommendations. 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 SLP reviewed memory compensations, including writing down information. SLP educated patient on using notes section in phone to write down information (grocery lists, machine numbers at [employer], etc). Pt verbalized understanding and expressed excitement for new external memory aid. Some concern for driving unfamiliar locations reported, as pt  may get confused/distracted and miss turn. SLP suggested using GPS and/or only driving to familiar locations due to significant safety concerns. Rare dysnomia x3 exhibited this session, in which pt able to correct 2/3 with additional processing time and use of description strategy.      Assessment / Recommendations / Plan   Plan Continue with current plan of care      Progression Toward Goals   Progression toward goals Progressing toward goals              SLP Education - 05/12/21 1130     Education Details impact on memory on functioning, external memory aids    Person(s) Educated Patient    Methods Explanation;Demonstration;Handout    Comprehension Verbalized understanding;Returned demonstration;Need further instruction              SLP Short Term Goals - 05/12/21 1130       SLP SHORT TERM GOAL #1   Title Pt will demonstrate dysarthria compensations in 10 minute simple to mod complex conversation with min A over 2 sessions    Time 1    Period Weeks    Status On-going      SLP SHORT TERM GOAL #2   Title Pt will use memory/attention compensations for appointments, medicine management, and other daily activities with occasional min A over 3 sessions.    Baseline 05-12-21    Time 1    Period Weeks    Status On-going      SLP SHORT TERM GOAL #3   Title Pt will ID and correct word finding errors with 80% accuracy given occasional min A over 2 sessions    Baseline 05-12-21    Time 1    Period Weeks    Status On-going      SLP SHORT TERM GOAL #4   Title Pt will verbalize 3 safety concerns in the home and generate solutions to them with occasional min A over 2 sessions    Baseline 05-12-21    Time 1    Period Weeks    Status On-going      SLP SHORT TERM GOAL #5   Title Pt will complete dysphagia HEP to optimize swallow function with occasional min A over 2 sessions    Time 1    Period Weeks    Status On-going      SLP SHORT TERM GOAL #6   Title Pt will exhibit  improved oral coordination on targeted apraxia tasks with occasional min A over 2 sessions    Time 1    Period Weeks    Status On-going              SLP Long Term Goals - 05/12/21 1131       SLP LONG TERM GOAL #1   Title Pt will demonstrate dysarthria compensations in 15 minute simple to mod complex conversation with min A over 2 sessions  Time 5    Period Weeks    Status On-going      SLP LONG TERM GOAL #2   Title Pt will use memory/attention compensations for appointments, medicine management, and other daily activities with rare min A over 3 sessions.    Time 5    Period Weeks    Status On-going      SLP LONG TERM GOAL #3   Title Pt will ID and correct word finding errors with 95% accuracy given occasional min A over 2 sessions    Time 5    Period Weeks    Status On-going      SLP LONG TERM GOAL #4   Title Pt will report improved diet tolerance and reduced s/sx of aspiration with rare min over 2 sessions    Time 5    Period Weeks    Status On-going      SLP LONG TERM GOAL #5   Title Pt will undergo repeat objective swallow study as needed if s/sx of aspiration persist    Time 5    Period Weeks    Status On-going              Plan - 05/12/21 1131     Clinical Impression Statement Kara Bailey cont to present with changes in speech, cognitive communication, and swallowing s/p CVA in February 2022. Pt reports increased accuracy of breathing exercises to maximize and coordinate breath support for verbal apraxia and dysarthria. SLP recommends continuing to target breathing exercises at home using most recent simplified educational handout. SLP targeted cognition, and introduced external memory aids due to memory deficits reported in day to day activities. Training completed to use notes section in phone as memory compensation. Pt is now successfully taking medications on time per SLP recommendation for setting daily alarms. Skilled ST is cont'd recommended to address  speech, cognitive communication, and dysphagia impacting pt's overall safety and functional independence compared to baseline.    Speech Therapy Frequency 2x / week    Duration 8 weeks   or 17 visits   Treatment/Interventions Aspiration precaution training;Oral motor exercises;Compensatory strategies;Pharyngeal strengthening exercises;Functional tasks;Patient/family education;Diet toleration management by SLP;Cognitive reorganization;Multimodal communcation approach;Language facilitation;Compensatory techniques;Internal/external aids;SLP instruction and feedback    Potential to Achieve Goals Fair    Potential Considerations Ability to learn/carryover information;Severity of impairments    SLP Home Exercise Plan provided    Consulted and Agree with Plan of Care Patient             Patient will benefit from skilled therapeutic intervention in order to improve the following deficits and impairments:   Cognitive communication deficit  Aphasia  Dysarthria and anarthria  Verbal apraxia    Problem List Patient Active Problem List   Diagnosis Date Noted   Acute CVA (cerebrovascular accident) ([place]) 12/29/2020   Peripheral arterial disease ([place]) 12/20/2020   History of esophageal stricture 07/08/2020   Insomnia 12/02/2017   Chronic cough 08/14/2017   Poor appetite 08/29/2016   Peptic stricture of esophagus    History of colonic polyps    Hiatal hernia 03/16/2016   Odynophagia 03/16/2016   Dysphagia 03/16/2016   Alcohol abuse 10/26/2015   Essential hypertension 09/26/2015   PAC (premature atrial contraction) 10/11/2014   PVC (premature ventricular contraction) 10/11/2014   Back muscle spasm 06/15/2014   Generalized anxiety disorder 06/15/2014   Smoking 06/15/2014   Chronic hepatitis C without hepatic coma ([place]) 05/28/2013    Alinda Deem, MA CCC-SLP 05/12/2021, 11:40 AM  [facility] [address] [place], [place], [address] Phone: [phone]   Fax:  [phone]   Name: ALLISSON SCHINDEL MRN: [id number] Date of Birth: [birth year]

## 2021-05-12 NOTE — Patient Instructions (Addendum)
Get small notebook to keep in your purse so you can write down anything you need to remember (ex: washer numbers, grocery list, etc).  "[employer] Notes" app on your phone - you can write things here, including your grocery list (Garlic powder & soul food seasoning)   Write down instructions for laundromat   Washer:  1. 2. 3.  Dryer:  Deposit quarters for number of minutes you want (1 quarter= 8 minutes) Make sure you select the right dryer (up/down arrow)    Continue to practice your breathing exercises!

## 2021-05-12 NOTE — Patient Instructions (Signed)
Access Code: [facility] URL: [email or web address] Date: 05/12/2021 Prepared by: Janann August  Exercises Supine Bridge - 1 x daily - 5 x weekly - 1 sets - 10 reps Seated Heel Toe Raises - 2 x daily - 5 x weekly - 2 sets - 10 reps Alternating Step Taps with Counter Support - 2 x daily - 5 x weekly - 1 sets - 10 reps Seated Long Arc Quad - 2 x daily - 5 x weekly - 2 sets - 10 reps  New addition on 05/12/21:  Standing Romberg to 3/4 Tandem Stance - 1 x daily - 5 x weekly - 3 sets - 20 hold

## 2021-05-12 NOTE — Therapy (Signed)
[facility] [address] [place], [place], [address] Phone: [phone]   Fax:  [phone]  Occupational Therapy Treatment & Recertification  Patient Details  Name: Kara Bailey MRN: [id number] Date of Birth: 03-15-1956 Referring Provider (OT): Cipriano Mile, NP   Encounter Date: 05/12/2021   OT End of Session - 05/12/21 1104     Visit Number 9    Number of Visits 17    Date for OT Re-Evaluation 06/16/21   at renewal   [place] Time Period Covered 100%    Authorization - Visit Number 8    Authorization - Number of Visits 8    Progress Note Due on Visit 18    OT Start Time 1102    OT Stop Time 4920    OT Time Calculation (min) 43 min    Activity Tolerance Patient tolerated treatment well    Behavior During Therapy [facility] for tasks assessed/performed             Past Medical History:  Diagnosis Date   Anxiety    Chest pain 08/2014   unspecified   Cirrhosis ([place])    Depression    Dizziness 08/2014   Dyspnea 08/2014   Emphysema lung ([place])    GERD (gastroesophageal reflux disease)    Heart murmur    Hepatitis C    Hiatal hernia    Hypertension    PAC (premature atrial contraction) 10/11/2014   PAD (peripheral artery disease) (HCC)    Palpitations    PVC (premature ventricular contraction) 10/11/2014   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   Stroke ([place])    just slurred speech   Tachycardia 08/2014   Ventral hernia    Weakness 08/2014    Past Surgical History:  Procedure Laterality Date   APPENDECTOMY     CATARACT EXTRACTION, BILATERAL     COLONOSCOPY     COLONOSCOPY WITH PROPOFOL N/A 04/30/2016   Procedure: COLONOSCOPY WITH PROPOFOL;  Surgeon: Daneil Dolin, MD;  [facility];  Service: Endoscopy;  Laterality: N/A;  1230   ESOPHAGOGASTRODUODENOSCOPY     approximately 2010   ESOPHAGOGASTRODUODENOSCOPY (EGD) WITH  PROPOFOL N/A 04/30/2016   Procedure: ESOPHAGOGASTRODUODENOSCOPY (EGD) WITH PROPOFOL;  Surgeon: Daneil Dolin, MD;  [facility];  Service: Endoscopy;  Laterality: N/A;   LEFT HEART CATHETERIZATION WITH CORONARY ANGIOGRAM N/A 10/29/2014   07-14-20- pt denies this Procedure: LEFT HEART CATHETERIZATION WITH CORONARY ANGIOGRAM;  Surgeon: Burnell Blanks, MD;  Location: [facility] CATH LAB;  Service: Cardiovascular;  Laterality: N/A;   MALONEY DILATION N/A 04/30/2016   Procedure: Venia Minks DILATION;  Surgeon: Daneil Dolin, MD;  [facility];  Service: Endoscopy;  Laterality: N/A;   POLYPECTOMY  04/30/2016   Procedure: POLYPECTOMY;  Surgeon: Daneil Dolin, MD;  [facility];  Service: Endoscopy;;  Sigmoid colon polyp removed via hot snare   UPPER GASTROINTESTINAL ENDOSCOPY      There were no vitals filed for this visit.   Subjective Assessment - 05/12/21 1103     Subjective  "I'm good - it's friday!"    Pertinent History PMH smoking, PAD, HTN, ETOH abuse, chronic hepatitis C with hepatic coma,GAD, esophageal stricture,  recent episode of possible hematemesis    Limitations Fall Risk    Patient Stated Goals "all of that is ok"    Currently in Pain? No/denies  Checked Goals. See below.  Physical and Cognitive with increased difficulty with following ABCs and tossing ball consistently. Pt sat down at letter "M" and had a difficulty time jumping back in with tossing ball and word finding. Froze at letter "O", max cues and d/c at letter "Q".  Constant Therapy Alternating Symbols level 6 with 94% accuracy and 113.52s response time. Increased time for completing task.                       OT Long Term Goals - 05/12/21 1105       OT LONG TERM GOAL #1   Title Pt will be independent with HEP for coordination and grip strength    Time 4    Period Weeks    Status Achieved      OT LONG TERM GOAL #2   Title Pt will improve grip  strength, bilaterally, to a functional strength (35lbs or greater).    Baseline R 32.6, L 26.6    Time 4    Period Weeks    Status On-going   L 29, R 29.9     OT LONG TERM GOAL #3   Title Pt will perform physical and cognitive task simultaneously with 90% accuracy    Time 4    Period Weeks    Status On-going      OT LONG TERM GOAL #4   Title Pt will verbalize understanding of adapted strategies and/or equipment PRN to increase safety and independence with ADLs and IADLs (cutting vegetables, etc)    Time 4    Period Weeks    Status Achieved   pt reports not having difficulty with cutting vegetables anymore at home 04/19/21     OT LONG TERM GOAL #5   Title Pt will report decreased drops at home d/t increased coordination and attention to right side.    Time 5    Period Weeks    Status New    Target Date 06/16/21                   Plan - 05/12/21 1114     Clinical Impression Statement Pt has met 2/4 LTGs and is making slow and steady progressing towards unmet goals. New goal added for increased coordination and attention in RUE d/t patient reporting frequent drops of items.    OT Occupational Profile and History Problem Focused Assessment - Including review of records relating to presenting problem    Occupational performance deficits (Please refer to evaluation for details): ADL's;IADL's    Body Structure / Function / Physical Skills ADL;FMC;Coordination;Decreased knowledge of use of DME;Dexterity;GMC;Strength;UE functional use;Vision;IADL    Cognitive Skills Attention;Sequencing;Safety Awareness;Problem Solve    Rehab Potential Good    Clinical Decision Making Limited treatment options, no task modification necessary    Comorbidities Affecting Occupational Performance: None    Modification or Assistance to Complete Evaluation  No modification of tasks or assist necessary to complete eval    OT Frequency 2x / week    OT Duration Other (comment)   2x/week for 4 more weeks  at renewal - 5 weeks to allow for missed visits   OT Treatment/Interventions Self-care/ADL training;Neuromuscular education;Energy conservation;Functional Mobility Training;Patient/family education;Cognitive remediation/compensation;Therapeutic exercise;Therapeutic activities;DME and/or AE instruction;Moist Heat;Fluidtherapy;Visual/perceptual remediation/compensation    Plan renewal completed. coordination (gross and fine), Grip Strength BUE, alternating attention/dual tasking    Recommended Other Services get scheduled for PT (referral in Epic)    Consulted and Agree with Plan of  Care Patient             Patient will benefit from skilled therapeutic intervention in order to improve the following deficits and impairments:   Body Structure / Function / Physical Skills: ADL, FMC, Coordination, Decreased knowledge of use of DME, Dexterity, GMC, Strength, UE functional use, Vision, IADL Cognitive Skills: Attention, Sequencing, Safety Awareness, Problem Solve     Visit Diagnosis: Unsteadiness on feet  Attention and concentration deficit  Other lack of coordination  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)  Muscle weakness (generalized)    Problem List Patient Active Problem List   Diagnosis Date Noted   Acute CVA (cerebrovascular accident) ([place]) 12/29/2020   Peripheral arterial disease ([place]) 12/20/2020   History of esophageal stricture 07/08/2020   Insomnia 12/02/2017   Chronic cough 08/14/2017   Poor appetite 08/29/2016   Peptic stricture of esophagus    History of colonic polyps    Hiatal hernia 03/16/2016   Odynophagia 03/16/2016   Dysphagia 03/16/2016   Alcohol abuse 10/26/2015   Essential hypertension 09/26/2015   PAC (premature atrial contraction) 10/11/2014   PVC (premature ventricular contraction) 10/11/2014   Back muscle spasm 06/15/2014   Generalized anxiety disorder 06/15/2014   Smoking 06/15/2014    Chronic hepatitis C without hepatic coma ([place]) 05/28/2013    Zachery Conch MOT, OTR/L  05/12/2021, 12:08 PM  [facility] [address] [place], [place], [address] Phone: [phone]   Fax:  [phone]  Name: Kara Bailey MRN: [id number] Date of Birth: [birth year]

## 2021-05-12 NOTE — Therapy (Addendum)
[facility] [address] [place], [place], [address] Phone: [phone]   Fax:  [phone]  Physical Therapy Treatment  Patient Details  Name: Kara Bailey MRN: [id number] Date of Birth: 1956/07/03 Referring Provider (PT): Cipriano Mile, NP   Encounter Date: 05/12/2021   PT End of Session - 05/12/21 1032     Visit Number 8    Number of Visits 17    Date for PT Re-Evaluation 07/17/21   written for 60 day POC   Authorization Type [employer] - auth from 6/7-07/17/21    PT Start Time 0933    PT Stop Time 1014    PT Time Calculation (min) 41 min    Equipment Utilized During Treatment Gait belt    Activity Tolerance Patient tolerated treatment well    Behavior During Therapy [facility] for tasks assessed/performed             Past Medical History:  Diagnosis Date   Anxiety    Chest pain 08/2014   unspecified   Cirrhosis ([place])    Depression    Dizziness 08/2014   Dyspnea 08/2014   Emphysema lung ([place])    GERD (gastroesophageal reflux disease)    Heart murmur    Hepatitis C    Hiatal hernia    Hypertension    PAC (premature atrial contraction) 10/11/2014   PAD (peripheral artery disease) (HCC)    Palpitations    PVC (premature ventricular contraction) 10/11/2014   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   Stroke ([place])    just slurred speech   Tachycardia 08/2014   Ventral hernia    Weakness 08/2014    Past Surgical History:  Procedure Laterality Date   APPENDECTOMY     CATARACT EXTRACTION, BILATERAL     COLONOSCOPY     COLONOSCOPY WITH PROPOFOL N/A 04/30/2016   Procedure: COLONOSCOPY WITH PROPOFOL;  Surgeon: Daneil Dolin, MD;  [facility];  Service: Endoscopy;  Laterality: N/A;  1230   ESOPHAGOGASTRODUODENOSCOPY     approximately 2010   ESOPHAGOGASTRODUODENOSCOPY (EGD) WITH PROPOFOL N/A 04/30/2016   Procedure: ESOPHAGOGASTRODUODENOSCOPY (EGD) WITH PROPOFOL;  Surgeon: Daneil Dolin, MD;  [facility];  Service: Endoscopy;  Laterality: N/A;   LEFT HEART CATHETERIZATION WITH CORONARY ANGIOGRAM N/A 10/29/2014   07-14-20- pt denies this Procedure: LEFT HEART CATHETERIZATION WITH CORONARY ANGIOGRAM;  Surgeon: Burnell Blanks, MD;  Location: [facility] CATH LAB;  Service: Cardiovascular;  Laterality: N/A;   MALONEY DILATION N/A 04/30/2016   Procedure: Venia Minks DILATION;  Surgeon: Daneil Dolin, MD;  [facility];  Service: Endoscopy;  Laterality: N/A;   POLYPECTOMY  04/30/2016   Procedure: POLYPECTOMY;  Surgeon: Daneil Dolin, MD;  [facility];  Service: Endoscopy;;  Sigmoid colon polyp removed via hot snare   UPPER GASTROINTESTINAL ENDOSCOPY      There were no vitals filed for this visit.   Subjective Assessment - 05/12/21 0935     Subjective Has been working on her walking and getting more weight to her LLE. No falls.    Pertinent History smoking, PAD, HTN, ETOH abuse, chronic hepatitis C with hepatic coma,GAD, esophageal stricture    Limitations Walking;Standing    Patient Stated Goals wants to work on walking normally    Currently in Pain? No/denies  [facility] Adult PT Treatment/Exercise - 05/12/21 0938       Ambulation/Gait   Ambulation/Gait Yes    Ambulation/Gait Assistance 5: Supervision;4: Min guard    Ambulation/Gait Assistance Details pt uses rollator for outdoor longer distances, trialed [facility] today for balance for shorter community distances for balance due to fall risk, holding cane in RUE, cues for proper sequencing (verbal, demo, and hand over hand at times), pt with incr difficulty performing with consistent step through pattern and reporting feeling awkward, would benefit from additional practice to determine if a SPC would be a safe and feasible option    Ambulation Distance (Feet) 115 Feet   x1 - plus additional distances   Assistive device None;Straight cane    Gait  Pattern Decreased arm swing - right;Step-through pattern;Decreased stance time - right;Decreased hip/knee flexion - right;Decreased dorsiflexion - right;Poor foot clearance - right;Lateral trunk lean to right    Ambulation Surface Level;Indoor    Gait velocity 17.91 seconds = 1.83 ft/sec    Gait Comments asked pt to try wearing shoes with laces at next session to trial foot up brace (not wearing shoes with laces today)              Access Code: [email or web address] URL: [email or web address] Date: 05/12/2021 Prepared by: Janann August  Reviewed pt's previous HEP and reminded on proper technique for certain exercises and to incorporate heel toe raises in sitting as she has not been performing at home.   Exercises Supine Bridge - 1 x daily - 5 x weekly - 1 sets - 10 reps Seated Heel Toe Raises - 2 x daily - 5 x weekly - 2 sets - 10 reps Alternating Step Taps with Counter Support - 2 x daily - 5 x weekly - 1 sets - 10 reps - cues for proper technique and to keep hips pointed forwards vs. Rotating to avoid compensatory movement.  Seated Long Arc Quad - 2 x daily - 5 x weekly - 2 sets - 10 reps  New addition on 05/12/21:  Standing Romberg to 3/4 Tandem Stance - 1 x daily - 5 x weekly - 3 sets - 20 hold - standing at countertop with BUE support with each leg posteriorly, cues to keep a soft bend in the knee.        PT Education - 05/12/21 1031     Education Details reviewed HEP, new addition for standing balance              PT Short Term Goals - 05/12/21 1012       PT SHORT TERM GOAL #1   Title Pt will be independent with initial HEP in order to build upon functional gains made in therapy. ALL STGS DUE 05/16/21    Baseline needing reminder cues for technique    Time 4    Period Weeks    Status Partially Met    Target Date 05/16/21      PT SHORT TERM GOAL #2   Title Pt will finish BERG assessment to determine fall risk with LTG written    Baseline 43/56 - LTG written     Time 4    Period Weeks    Status Achieved      PT SHORT TERM GOAL #3   Title Pt will decr TUG time to 17 seconds or less with no AD in order to demo decr fall risk.    Baseline 20.47 seconds; 16.18 ON 05/09/21    Time 4    Period  Weeks    Status Achieved      PT SHORT TERM GOAL #4   Title Pt will improve gait speed to at least 2.2 ft/sec with no AD vs. LRAD in order to demo decr fall risk.    Baseline 1.97 ft/sec; 1.83 ft/sec with no AD    Time 4    Period Weeks    Status Not Met      PT SHORT TERM GOAL #5   Title Pt will decr 5x sit <> stand time to 50 seconds or less in order to demo improved BLE strength and balance.    Baseline 64.32 seconds no UE support; 31.47 seconds on 05/09/21    Time 4    Period Weeks    Status Achieved              PT Long Term Goals - 05/09/21 1023       PT LONG TERM GOAL #1   Title Pt will improve BERG score to at least a 48/56 in order to demo decr fall risk. ALL LTGS DUE 06/13/21    Baseline 43/56    Time 8    Period Weeks    Status Revised      PT LONG TERM GOAL #2   Title Pt will go ascend and descend 4 steps with single handrail vs. use of LRAD with supervision in order to be able to go to her son's house.    Baseline can not perform stairs    Time 8    Period Weeks    Status New      PT LONG TERM GOAL #3   Title Pt will decr 5x sit <> stand time to 25 seconds or less in order to demo improved BLE strength and balance.    Baseline 64.32 seconds no UE support; 31.47 seconds on 05/09/21 with no UE support    Time 8    Period Weeks    Status Revised      PT LONG TERM GOAL #4   Title Pt will ambulate at least 300' outdoors over unlevel paved surfaces with LRAD in order to demo improved community mobility.    Baseline not yet assessed.    Time 8    Period Weeks    Status New      PT LONG TERM GOAL #5   Title Pt will decr TUG time to 13.5 seconds or less with no AD in order to demo decr fall risk.    Baseline 20.47 seconds;  16.18 ON 05/09/21    Time 8    Period Weeks    Status Revised      PT LONG TERM GOAL #6   Title Pt will improve FOTO score to at least 60% in order to demo improved functional outcomes.    Baseline 50%    Time 8    Period Weeks    Status New                   Plan - 05/12/21 1203     Clinical Impression Statement Assessed remaining STGs today. Pt did not meet STG #4 in regards to gait speed, pt's gait speed today was 1.83 ft/sec with no AD (previously 1.97 ft/sec) indicating a limited community ambulator. Pt partially met STG #1 in regards to HEP - needed review of some technique of exercises and reminder/purpose of heel toe raises as pt had not been performing. Tried using the [facility] today for gait training in pt's  RUE, pt with difficulty sequencing and needing hand over hand assist at times. Might benefit from continued practice to use for balance for community use (does currently use rollator in the community). Will continue to progress towards LTGs.    Personal Factors and Comorbidities Comorbidity 3+;Past/Current Experience    Comorbidities smoking, PAD, HTN, ETOH abuse, chronic hepatitis C with hepatic coma,GAD, esophageal stricture    Examination-Activity Limitations Locomotion Level;Transfers;Stairs;Squat;Carry    Examination-Participation Restrictions Cleaning;Community Activity;Laundry    Stability/Clinical Decision Making Stable/Uncomplicated    Rehab Potential Good    PT Frequency 2x / week    PT Duration 8 weeks    PT Treatment/Interventions ADLs/Self Care Home Management;DME Instruction;Gait training;Stair training;Therapeutic activities;Functional mobility training;Therapeutic exercise;Balance training;Neuromuscular re-education;Orthotic Fit/Training;Patient/family education;Vestibular    PT Next Visit Plan review new addition to HEP. trial foot up brace (pt to wear shoes with laces to session this time). to work on BLE strengthening, standing balance (work EC, narrow  BOS, SLS tasks, head motions), sit <> stand training. stair training. continue with SPC?    PT Home Exercise Plan [facility]    Consulted and Agree with Plan of Care Patient             Patient will benefit from skilled therapeutic intervention in order to improve the following deficits and impairments:  Abnormal gait, Decreased activity tolerance, Decreased balance, Decreased coordination, Decreased endurance, Decreased knowledge of use of DME, Difficulty walking, Decreased strength, Dizziness, Impaired sensation, Postural dysfunction  Visit Diagnosis: Unsteadiness on feet  Other symptoms and signs involving the nervous system  Muscle weakness (generalized)     Problem List Patient Active Problem List   Diagnosis Date Noted   Acute CVA (cerebrovascular accident) ([place]) 12/29/2020   Peripheral arterial disease ([place]) 12/20/2020   History of esophageal stricture 07/08/2020   Insomnia 12/02/2017   Chronic cough 08/14/2017   Poor appetite 08/29/2016   Peptic stricture of esophagus    History of colonic polyps    Hiatal hernia 03/16/2016   Odynophagia 03/16/2016   Dysphagia 03/16/2016   Alcohol abuse 10/26/2015   Essential hypertension 09/26/2015   PAC (premature atrial contraction) 10/11/2014   PVC (premature ventricular contraction) 10/11/2014   Back muscle spasm 06/15/2014   Generalized anxiety disorder 06/15/2014   Smoking 06/15/2014   Chronic hepatitis C without hepatic coma ([place]) 05/28/2013    Arliss Journey, PT, DPT  05/12/2021, 12:04 PM  [facility] [address] [place], [place], [address] Phone: [phone]   Fax:  [phone]  Name: Kara Bailey MRN: [id number] Date of Birth: [birth year]

## 2021-05-16 ENCOUNTER — Other Ambulatory Visit: Payer: Self-pay

## 2021-05-16 ENCOUNTER — Ambulatory Visit: Payer: Medicare HMO | Admitting: Physical Therapy

## 2021-05-16 ENCOUNTER — Ambulatory Visit: Payer: Medicare HMO

## 2021-05-16 DIAGNOSIS — R1310 Dysphagia, unspecified: Secondary | ICD-10-CM

## 2021-05-16 DIAGNOSIS — R2681 Unsteadiness on feet: Secondary | ICD-10-CM | POA: Diagnosis not present

## 2021-05-16 DIAGNOSIS — R482 Apraxia: Secondary | ICD-10-CM

## 2021-05-16 DIAGNOSIS — R471 Dysarthria and anarthria: Secondary | ICD-10-CM

## 2021-05-16 NOTE — Therapy (Signed)
[facility] [address] [place], [place], [address] Phone: [phone]   Fax:  [phone]  Speech Language Pathology Treatment  Patient Details  Name: Kara Bailey MRN: [id number] Date of Birth: 1956-09-22 Referring Provider (SLP): Cipriano Mile, NP   Encounter Date: 05/16/2021   End of Session - 05/16/21 1149     Visit Number 9    Number of Visits 17    Date for SLP Re-Evaluation 06/09/21    Authorization Type [employer]    Authorization Time Period 04-14-21 to 06-09-21 (9 visits)    SLP Start Time 1150    SLP Stop Time  1230    SLP Time Calculation (min) 40 min    Activity Tolerance Patient tolerated treatment well             Past Medical History:  Diagnosis Date   Anxiety    Chest pain 08/2014   unspecified   Cirrhosis ([place])    Depression    Dizziness 08/2014   Dyspnea 08/2014   Emphysema lung (H[facility])    GERD (gastroesophageal reflux disease)    Heart murmur    Hepatitis C    Hiatal hernia    Hypertension    PAC (premature atrial contraction) 10/11/2014   PAD (peripheral artery disease) (HCC)    Palpitations    PVC (premature ventricular contraction) 10/11/2014   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   Stroke ([place])    just slurred speech   Tachycardia 08/2014   Ventral hernia    Weakness 08/2014    Past Surgical History:  Procedure Laterality Date   APPENDECTOMY     CATARACT EXTRACTION, BILATERAL     COLONOSCOPY     COLONOSCOPY WITH PROPOFOL N/A 04/30/2016   Procedure: COLONOSCOPY WITH PROPOFOL;  Surgeon: Daneil Dolin, MD;  [facility];  Service: Endoscopy;  Laterality: N/A;  1230   ESOPHAGOGASTRODUODENOSCOPY     approximately 2010   ESOPHAGOGASTRODUODENOSCOPY (EGD) WITH PROPOFOL N/A 04/30/2016   Procedure: ESOPHAGOGASTRODUODENOSCOPY (EGD) WITH PROPOFOL;  Surgeon: Daneil Dolin, MD;  [facility];  Service: Endoscopy;  Laterality: N/A;   LEFT  HEART CATHETERIZATION WITH CORONARY ANGIOGRAM N/A 10/29/2014   07-14-20- pt denies this Procedure: LEFT HEART CATHETERIZATION WITH CORONARY ANGIOGRAM;  Surgeon: Burnell Blanks, MD;  Location: [facility] CATH LAB;  Service: Cardiovascular;  Laterality: N/A;   Bailey DILATION N/A 04/30/2016   Procedure: Venia Minks DILATION;  Surgeon: Daneil Dolin, MD;  [facility];  Service: Endoscopy;  Laterality: N/A;   POLYPECTOMY  04/30/2016   Procedure: POLYPECTOMY;  Surgeon: Daneil Dolin, MD;  [facility];  Service: Endoscopy;;  Sigmoid colon polyp removed via hot snare   UPPER GASTROINTESTINAL ENDOSCOPY      There were no vitals filed for this visit.   Subjective Assessment - 05/16/21 1150     Subjective "it's almost full" re: notes section of phone    Currently in Pain? No/denies                   ADULT SLP TREATMENT - 05/16/21 1148       General Information   Behavior/Cognition Alert;Cooperative;Pleasant mood      Treatment Provided   Treatment provided Cognitive-Linquistic;Dysphagia      Dysphagia Treatment   Temperature Spikes Noted No    Respiratory Status Room air    Oral Cavity - Dentition Edentulous    Treatment Methods Skilled observation;Patient/caregiver education  Patient observed directly with PO's Yes    Type of PO's observed Thin liquids    Liquids provided via Cup;Straw    Oral Phase Signs & Symptoms Oral holding    Type of cueing Tactile    Amount of cueing Modified independent    Other treatment/comments SLP conducted PO trials of thin liquids via cup and straw. Overt oral holding noted with thin liquids via cup, which mildy improved with straw. Pt may benefit from tactile feedback from straw on lips to facilitate AP propulsion of bolus. SLP recommended pt continue to trial straw at home given no overt s/sx of aspiration exhibited. Of note, mild SOB noted after consecutive sips with SLP cueing diaphragmatic breathing.       Cognitive-Linquistic Treatment   Treatment focused on Apraxia;Dysarthria;Patient/family/caregiver education    Skilled Treatment Pt endorses her family can't understand her, especially her partner. SLP reviewed SLOP dysarthria compensations, in which carryover was impacted by reduced breath support. Usual cues required to inhale prior to speaking to maximize loudness and slow rate of speech as pt noted to rush through utterances. Pt may benefit from EMST training to maximize breath support and coordination.      Assessment / Recommendations / Plan   Plan Continue with current plan of care      Progression Toward Goals   Progression toward goals Progressing toward goals              SLP Education - 05/16/21 1557     Education Details SLOP, abdominal breathing, speaking on exhalation    Person(s) Educated Patient    Methods Explanation;Demonstration;Handout    Comprehension Verbalized understanding;Returned demonstration;Need further instruction              SLP Short Term Goals - 05/16/21 1150       SLP SHORT TERM GOAL #1   Title Pt will demonstrate dysarthria compensations in 10 minute simple to mod complex conversation with min A over 2 sessions    Time 1    Period Weeks    Status On-going      SLP SHORT TERM GOAL #2   Title Pt will use memory/attention compensations for appointments, medicine management, and other daily activities with occasional min A over 3 sessions.    Baseline 05-12-21    Time 1    Period Weeks    Status On-going      SLP SHORT TERM GOAL #3   Title Pt will ID and correct word finding errors with 80% accuracy given occasional min A over 2 sessions    Baseline 05-12-21    Time 1    Period Weeks    Status On-going      SLP SHORT TERM GOAL #4   Title Pt will verbalize 3 safety concerns in the home and generate solutions to them with occasional min A over 2 sessions    Baseline 05-12-21    Time 1    Period Weeks    Status On-going      SLP SHORT  TERM GOAL #5   Title Pt will complete dysphagia HEP to optimize swallow function with occasional min A over 2 sessions    Time 1    Period Weeks    Status On-going      SLP SHORT TERM GOAL #6   Title Pt will exhibit improved oral coordination on targeted apraxia tasks with occasional min A over 2 sessions    Time 1    Period Weeks  Status On-going              SLP Long Term Goals - 05/16/21 1150       SLP LONG TERM GOAL #1   Title Pt will demonstrate dysarthria compensations in 15 minute simple to mod complex conversation with min A over 2 sessions    Time 4    Period Weeks    Status On-going      SLP LONG TERM GOAL #2   Title Pt will use memory/attention compensations for appointments, medicine management, and other daily activities with rare min A over 3 sessions.    Time 4    Period Weeks    Status On-going      SLP LONG TERM GOAL #3   Title Pt will ID and correct word finding errors with 95% accuracy given occasional min A over 2 sessions    Time 4    Period Weeks    Status On-going      SLP LONG TERM GOAL #4   Title Pt will report improved diet tolerance and reduced s/sx of aspiration with rare min over 2 sessions    Time 4    Period Weeks    Status On-going      SLP LONG TERM GOAL #5   Title Pt will undergo repeat objective swallow study as needed if s/sx of aspiration persist    Time 4    Period Weeks    Status On-going              Plan - 05/16/21 1558     Clinical Impression Statement Margit cont to present with changes in speech, cognitive communication, and swallowing s/p CVA in February 2022. Ongoing education and training provided to maximize and coordinate breath support to address verbal apraxia and dysarthria, as pt expressed concern for family members unable to understand her. SLP re-educated patient on dysarthria compensations, in which demonstration was intermittently impacted by reduced breath support.SLP recommends continuing to  target breathing exercises at home using most recent simplified educational handout. Further trials of thin liquids via straw recommended to assess oral holding. Skilled ST is cont'd recommended to address speech, cognitive communication, and dysphagia impacting pt's overall safety and functional independence compared to baseline.    Speech Therapy Frequency 2x / week    Duration 8 weeks   or 17 total visits   Treatment/Interventions Aspiration precaution training;Oral motor exercises;Compensatory strategies;Pharyngeal strengthening exercises;Functional tasks;Patient/family education;Diet toleration management by SLP;Cognitive reorganization;Multimodal communcation approach;Language facilitation;Compensatory techniques;Internal/external aids;SLP instruction and feedback    Potential to Achieve Goals Fair    Potential Considerations Ability to learn/carryover information;Severity of impairments    SLP Home Exercise Plan provided    Consulted and Agree with Plan of Care Patient             Patient will benefit from skilled therapeutic intervention in order to improve the following deficits and impairments:   Verbal apraxia  Dysarthria and anarthria  Dysphagia, unspecified type    Problem List Patient Active Problem List   Diagnosis Date Noted   Acute CVA (cerebrovascular accident) ([place]) 12/29/2020   Peripheral arterial disease ([place]) 12/20/2020   History of esophageal stricture 07/08/2020   Insomnia 12/02/2017   Chronic cough 08/14/2017   Poor appetite 08/29/2016   Peptic stricture of esophagus    History of colonic polyps    Hiatal hernia 03/16/2016   Odynophagia 03/16/2016   Dysphagia 03/16/2016   Alcohol abuse 10/26/2015   Essential hypertension 09/26/2015   PAC (  premature atrial contraction) 10/11/2014   PVC (premature ventricular contraction) 10/11/2014   Back muscle spasm 06/15/2014   Generalized anxiety disorder 06/15/2014   Smoking 06/15/2014   Chronic hepatitis C  without hepatic coma ([place]) 05/28/2013    Alinda Deem, MA CCC-SLP 05/16/2021, 4:00 PM  [facility] [address] [place], [place], [address] Phone: [phone]   Fax:  [phone]   Name: COLA GANE MRN: [id number] Date of Birth: [birth year]

## 2021-05-16 NOTE — Patient Instructions (Addendum)
   SLOW LOUD OVER-ENNUNCIATE (move mouth bigger) PAUSE   Try drinking from a straw. Make sure you are taking small sips

## 2021-05-19 ENCOUNTER — Ambulatory Visit: Payer: Medicare HMO | Admitting: Physical Therapy

## 2021-05-19 ENCOUNTER — Other Ambulatory Visit: Payer: Self-pay

## 2021-05-19 ENCOUNTER — Ambulatory Visit: Payer: Medicare HMO

## 2021-05-19 DIAGNOSIS — R2681 Unsteadiness on feet: Secondary | ICD-10-CM | POA: Diagnosis not present

## 2021-05-19 DIAGNOSIS — R41841 Cognitive communication deficit: Secondary | ICD-10-CM

## 2021-05-19 DIAGNOSIS — M6281 Muscle weakness (generalized): Secondary | ICD-10-CM

## 2021-05-19 DIAGNOSIS — R1310 Dysphagia, unspecified: Secondary | ICD-10-CM

## 2021-05-19 DIAGNOSIS — R4701 Aphasia: Secondary | ICD-10-CM

## 2021-05-19 DIAGNOSIS — R482 Apraxia: Secondary | ICD-10-CM

## 2021-05-19 DIAGNOSIS — R471 Dysarthria and anarthria: Secondary | ICD-10-CM

## 2021-05-19 NOTE — Therapy (Signed)
[facility] [address] [place], [place], [address] Phone: [phone]   Fax:  [phone]  Physical Therapy Treatment  Patient Details  Name: Kara Bailey MRN: [id number] Date of Birth: Jun 08, 1956 Referring Provider (PT): Cipriano Mile, NP   Encounter Date: 05/19/2021   PT End of Session - 05/19/21 1104     Visit Number 9    Number of Visits 17    Date for PT Re-Evaluation 07/17/21   written for 60 day POC   Authorization Type [employer] - auth from 6/7-07/17/21    PT Start Time 1104    PT Stop Time 1146    PT Time Calculation (min) 42 min    Equipment Utilized During Treatment --    Activity Tolerance Patient tolerated treatment well    Behavior During Therapy [facility] for tasks assessed/performed             Past Medical History:  Diagnosis Date   Anxiety    Chest pain 08/2014   unspecified   Cirrhosis ([place])    Depression    Dizziness 08/2014   Dyspnea 08/2014   Emphysema lung ([place])    GERD (gastroesophageal reflux disease)    Heart murmur    Hepatitis C    Hiatal hernia    Hypertension    PAC (premature atrial contraction) 10/11/2014   PAD (peripheral artery disease) (HCC)    Palpitations    PVC (premature ventricular contraction) 10/11/2014   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   Stroke ([place])    just slurred speech   Tachycardia 08/2014   Ventral hernia    Weakness 08/2014    Past Surgical History:  Procedure Laterality Date   APPENDECTOMY     CATARACT EXTRACTION, BILATERAL     COLONOSCOPY     COLONOSCOPY WITH PROPOFOL N/A 04/30/2016   Procedure: COLONOSCOPY WITH PROPOFOL;  Surgeon: Daneil Dolin, MD;  [facility];  Service: Endoscopy;  Laterality: N/A;  1230   ESOPHAGOGASTRODUODENOSCOPY     approximately 2010   ESOPHAGOGASTRODUODENOSCOPY (EGD) WITH PROPOFOL N/A 04/30/2016   Procedure: ESOPHAGOGASTRODUODENOSCOPY (EGD) WITH PROPOFOL;  Surgeon: Daneil Dolin, MD;  [facility];  Service: Endoscopy;  Laterality: N/A;   LEFT HEART CATHETERIZATION WITH CORONARY ANGIOGRAM N/A 10/29/2014   07-14-20- pt denies this Procedure: LEFT HEART CATHETERIZATION WITH CORONARY ANGIOGRAM;  Surgeon: Burnell Blanks, MD;  Location: [facility] CATH LAB;  Service: Cardiovascular;  Laterality: N/A;   MALONEY DILATION N/A 04/30/2016   Procedure: Venia Minks DILATION;  Surgeon: Daneil Dolin, MD;  [facility];  Service: Endoscopy;  Laterality: N/A;   POLYPECTOMY  04/30/2016   Procedure: POLYPECTOMY;  Surgeon: Daneil Dolin, MD;  [facility];  Service: Endoscopy;;  Sigmoid colon polyp removed via hot snare   UPPER GASTROINTESTINAL ENDOSCOPY      There were no vitals filed for this visit.   Subjective Assessment - 05/19/21 1104     Subjective Things about the same, no falls.    Pertinent History smoking, PAD, HTN, ETOH abuse, chronic hepatitis C with hepatic coma,GAD, esophageal stricture    Limitations Walking;Standing    Patient Stated Goals wants to work on walking normally    Currently in Pain? No/denies                               [facility] Adult PT Treatment/Exercise - 05/19/21 0001  Ambulation/Gait   Ambulation/Gait Yes    Ambulation/Gait Assistance 5: Supervision;4: Min guard    Ambulation/Gait Assistance Details Trial of foot-up brace during session today; pt demo improve L ankle dorsflexion, foot clearance, heelstrike with gait.  With trial when foot-up brace disengaged, she has immediate return to foot-flat, decreased foot clearance, decreased/no heelstrike.    Ambulation Distance (Feet) 115 Feet   no device, then 115 ft x 2 with rollator, 25 ft x 2, 100 ft x 2   Assistive device None    Gait Pattern Decreased arm swing - right;Step-through pattern;Decreased stance time - right;Decreased hip/knee flexion - right;Decreased dorsiflexion - right;Poor foot clearance - right;Lateral trunk lean to right     Ambulation Surface Level;Indoor    Pre-Gait Activities Based on pt's positive response to use of foot-up brace, PT provides information on how to obtain    Gait Comments Additional gait short distances, 50 ft x 4 reps with no device, pt's UEs support at therapist in front of pt, to allow PT to cue for weightbearing and step length.  Progressed to no support, no device 50 ft x 4, min guard at hips for cues for weightshift and step length.  Pt wearing foot-up brace.  At end of session, with foot-up brace removed from LLE, PT cues pt to improve awareness of L heelstrike and foot clearance.                 Balance Exercises - 05/19/21 0001       Balance Exercises: Standing   SLS Limitations LLE as stance with RLE step taps to 4" block x 10 BUE support, then 1 UE support, alternating step taps to 4" step    Standing, One Foot on a Step Eyes open;Head turns;4 reps;Limitations    Standing, One Foot on a Step Limitations 1 UE support, alt UE lifts x 5, head turns x 5, head nods x 5 with tactile and verbal cues to stand upright through Left (stance) hip.    Step Ups Forward;4 inch;UE support 2;Limitations    Step Ups Limitations Forward step up/up, down/down x 5 reps, cues for sequence               PT Education - 05/19/21 1155     Education Details benefits of foot-up brace; how to order/obtain foot-up brace    Person(s) Educated Patient    Methods Explanation;Demonstration;Handout    Comprehension Verbalized understanding;Returned demonstration              PT Short Term Goals - 05/12/21 1012       PT SHORT TERM GOAL #1   Title Pt will be independent with initial HEP in order to build upon functional gains made in therapy. ALL STGS DUE 05/16/21    Baseline needing reminder cues for technique    Time 4    Period Weeks    Status Partially Met    Target Date 05/16/21      PT SHORT TERM GOAL #2   Title Pt will finish BERG assessment to determine fall risk with LTG written     Baseline 43/56 - LTG written    Time 4    Period Weeks    Status Achieved      PT SHORT TERM GOAL #3   Title Pt will decr TUG time to 17 seconds or less with no AD in order to demo decr fall risk.    Baseline 20.47 seconds; 16.18 ON 05/09/21    Time  4    Period Weeks    Status Achieved      PT SHORT TERM GOAL #4   Title Pt will improve gait speed to at least 2.2 ft/sec with no AD vs. LRAD in order to demo decr fall risk.    Baseline 1.97 ft/sec; 1.83 ft/sec with no AD    Time 4    Period Weeks    Status Not Met      PT SHORT TERM GOAL #5   Title Pt will decr 5x sit <> stand time to 50 seconds or less in order to demo improved BLE strength and balance.    Baseline 64.32 seconds no UE support; 31.47 seconds on 05/09/21    Time 4    Period Weeks    Status Achieved               PT Long Term Goals - 05/09/21 1023       PT LONG TERM GOAL #1   Title Pt will improve BERG score to at least a 48/56 in order to demo decr fall risk. ALL LTGS DUE 06/13/21    Baseline 43/56    Time 8    Period Weeks    Status Revised      PT LONG TERM GOAL #2   Title Pt will go ascend and descend 4 steps with single handrail vs. use of LRAD with supervision in order to be able to go to her son's house.    Baseline can not perform stairs    Time 8    Period Weeks    Status New      PT LONG TERM GOAL #3   Title Pt will decr 5x sit <> stand time to 25 seconds or less in order to demo improved BLE strength and balance.    Baseline 64.32 seconds no UE support; 31.47 seconds on 05/09/21 with no UE support    Time 8    Period Weeks    Status Revised      PT LONG TERM GOAL #4   Title Pt will ambulate at least 300' outdoors over unlevel paved surfaces with LRAD in order to demo improved community mobility.    Baseline not yet assessed.    Time 8    Period Weeks    Status New      PT LONG TERM GOAL #5   Title Pt will decr TUG time to 13.5 seconds or less with no AD in order to demo decr fall  risk.    Baseline 20.47 seconds; 16.18 ON 05/09/21    Time 8    Period Weeks    Status Revised      PT LONG TERM GOAL #6   Title Pt will improve FOTO score to at least 60% in order to demo improved functional outcomes.    Baseline 50%    Time 8    Period Weeks    Status New                   Plan - 05/19/21 1156     Clinical Impression Statement Trial of foot-up brace on LLE in session today.  Pt demonstrates improved heelstrike, foot clearance with use of foot up brace. She continues to demonstrate decreased stance time on LLE, resulting in quick foot slap through on RLE; with verbal, tactile cues and repetition in session, she is able to improve.  Patient and therapist notice definite difference with use of foot-up brace versus when  it is disengaged.  She likes it and plans to order for home to use to improve gait stability and efficiency.    Personal Factors and Comorbidities Comorbidity 3+;Past/Current Experience    Comorbidities smoking, PAD, HTN, ETOH abuse, chronic hepatitis C with hepatic coma,GAD, esophageal stricture    Examination-Activity Limitations Locomotion Level;Transfers;Stairs;Squat;Carry    Examination-Participation Restrictions Cleaning;Community Activity;Laundry    Stability/Clinical Decision Making Stable/Uncomplicated    Rehab Potential Good    PT Frequency 2x / week    PT Duration 8 weeks    PT Treatment/Interventions ADLs/Self Care Home Management;DME Instruction;Gait training;Stair training;Therapeutic activities;Functional mobility training;Therapeutic exercise;Balance training;Neuromuscular re-education;Orthotic Fit/Training;Patient/family education;Vestibular    PT Next Visit Plan 10th visit progress note; review new addition to HEP. continue to work with foot up brace (?has pt ordered for home yet). to work on BLE strengthening, standing balance (work EC, narrow BOS, SLS tasks, head motions), sit <> stand training. stair training. continue with SPC?     PT Home Exercise Plan [facility]    Consulted and Agree with Plan of Care Patient             Patient will benefit from skilled therapeutic intervention in order to improve the following deficits and impairments:  Abnormal gait, Decreased activity tolerance, Decreased balance, Decreased coordination, Decreased endurance, Decreased knowledge of use of DME, Difficulty walking, Decreased strength, Dizziness, Impaired sensation, Postural dysfunction  Visit Diagnosis: Unsteadiness on feet  Muscle weakness (generalized)     Problem List Patient Active Problem List   Diagnosis Date Noted   Acute CVA (cerebrovascular accident) ([place]) 12/29/2020   Peripheral arterial disease ([place]) 12/20/2020   History of esophageal stricture 07/08/2020   Insomnia 12/02/2017   Chronic cough 08/14/2017   Poor appetite 08/29/2016   Peptic stricture of esophagus    History of colonic polyps    Hiatal hernia 03/16/2016   Odynophagia 03/16/2016   Dysphagia 03/16/2016   Alcohol abuse 10/26/2015   Essential hypertension 09/26/2015   PAC (premature atrial contraction) 10/11/2014   PVC (premature ventricular contraction) 10/11/2014   Back muscle spasm 06/15/2014   Generalized anxiety disorder 06/15/2014   Smoking 06/15/2014   Chronic hepatitis C without hepatic coma ([place]) 05/28/2013    Kenzlie Disch W. 05/19/2021, 12:01 PM Frazier Butt., PT   [facility] [address] [place], [place], [address] Phone: [phone]   Fax:  [phone]  Name: LAVADA LANGSAM MRN: [id number] Date of Birth: 14-May-1956

## 2021-05-19 NOTE — Therapy (Signed)
[facility] [address] [place], [place], [address] Phone: [phone]   Fax:  [phone]  Speech Language Pathology Treatment/Progress note  Patient Details  Name: Kara Bailey MRN: [id number] Date of Birth: 01-21-1956 Referring Provider (SLP): Cipriano Mile, NP   Encounter Date: 05/19/2021   End of Session - 05/19/21 1301     Visit Number 10    Number of Visits 17    Date for SLP Re-Evaluation 06/09/21    Authorization Type [employer]    Authorization Time Period 04-14-21 to 06-09-21 (9 visits) last session Kara Bailey stated total visits (17) are covered    Authorization - Visit Number 10    Authorization - Number of Visits 17   per Jenny Reichmann on 05-16-21   SLP Start Time 1150    SLP Stop Time  9678    SLP Time Calculation (min) 40 min    Activity Tolerance Patient tolerated treatment well             Past Medical History:  Diagnosis Date   Anxiety    Chest pain 08/2014   unspecified   Cirrhosis ([place])    Depression    Dizziness 08/2014   Dyspnea 08/2014   Emphysema lung (H[facility])    GERD (gastroesophageal reflux disease)    Heart murmur    Hepatitis C    Hiatal hernia    Hypertension    PAC (premature atrial contraction) 10/11/2014   PAD (peripheral artery disease) (HCC)    Palpitations    PVC (premature ventricular contraction) 10/11/2014   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   Stroke ([place])    just slurred speech   Tachycardia 08/2014   Ventral hernia    Weakness 08/2014    Past Surgical History:  Procedure Laterality Date   APPENDECTOMY     CATARACT EXTRACTION, BILATERAL     COLONOSCOPY     COLONOSCOPY WITH PROPOFOL N/A 04/30/2016   Procedure: COLONOSCOPY WITH PROPOFOL;  Surgeon: Daneil Dolin, MD;  [facility];  Service: Endoscopy;  Laterality: N/A;  1230   ESOPHAGOGASTRODUODENOSCOPY     approximately 2010   ESOPHAGOGASTRODUODENOSCOPY (EGD) WITH PROPOFOL N/A 04/30/2016    Procedure: ESOPHAGOGASTRODUODENOSCOPY (EGD) WITH PROPOFOL;  Surgeon: Daneil Dolin, MD;  [facility];  Service: Endoscopy;  Laterality: N/A;   LEFT HEART CATHETERIZATION WITH CORONARY ANGIOGRAM N/A 10/29/2014   07-14-20- pt denies this Procedure: LEFT HEART CATHETERIZATION WITH CORONARY ANGIOGRAM;  Surgeon: Burnell Blanks, MD;  [facility] CATH LAB;  Service: Cardiovascular;  Laterality: N/A;   MALONEY DILATION N/A 04/30/2016   Procedure: Venia Minks DILATION;  Surgeon: Daneil Dolin, MD;  [facility];  Service: Endoscopy;  Laterality: N/A;   POLYPECTOMY  04/30/2016   Procedure: POLYPECTOMY;  Surgeon: Daneil Dolin, MD;  [facility];  Service: Endoscopy;;  Sigmoid colon polyp removed via hot snare   UPPER GASTROINTESTINAL ENDOSCOPY      There were no vitals filed for this visit.  Speech Therapy Progress Note  Dates of Reporting Period: 04/14/21 to present  Subjective Statement: Pt has been seen for 10 ST sessions targeting apraxia of speech, dysarthria, cognition, swallowing, and aphasia.  Objective: See below.  Goal Update: See below.  Plan: See below  Reason Skilled Services are Required: Pt has not yet maximized ST potential.    Subjective Assessment - 05/19/21 1159     Subjective "We did the breathing" (pt, re: what she and SLP Kara Bailey  have worked on)                   ADULT SLP TREATMENT - 05/19/21 1200       General Information   Behavior/Cognition Alert;Cooperative;Pleasant mood      Treatment Provided   Treatment provided Dysphagia      Dysphagia Treatment   Liquids provided via Straw    Oral Phase Signs & Symptoms Oral holding   until SLP assured pt that when she takes smaller sips it would be very difficult for her to have problems   Other treatment/comments "Some good and some not good" - pt: re: straw trials at home. SLP worked with pt today with straw trials with H2O. Min cues occasionally to keep sips small,  faded to rare min A. Pt noted to hold liquid so SLP inquired why and pt indicated she was frightened she was going to choke - SLP assured pt if she kept sips small her likelihood of choking was small. Next 5 sips were WNL timing.      Cognitive-Linquistic Treatment   Treatment focused on Apraxia;Dysarthria    Skilled Treatment SLP reviewed necessary to speak at the top of the breath (SLP used visual to enhance pt understanidng). Pt read words and sentences initially with SLP model necessary, faded to rare min A for reading words at the top of the breath. Pt told SLP she was frustrated that she has to think about how to breathe and talk - SLP encouraged her to cont to practice and that she was on the right track to make things easier. SLP told pt prognosis is likely she will not talk 100% like she did premorbidly and cited severity of deficit, age, and time post-onset. Pt was encouraged that SLP stated her speech will improve, even if not to premorbid level.      Assessment / Recommendations / Plan   Plan Continue with current plan of care      Progression Toward Goals   Progression toward goals Progressing toward goals              SLP Education - 05/19/21 1301     Education Details see session notes    Person(s) Educated Patient    Methods Explanation;Demonstration;Verbal cues    Comprehension Verbalized understanding;Returned demonstration;Verbal cues required;Need further instruction              SLP Short Term Goals - 05/19/21 1303       SLP SHORT TERM GOAL #1   Title Pt will demonstrate dysarthria compensations in 10 minute simple to mod complex conversation with min A over 2 sessions    Status Not Met      SLP SHORT TERM GOAL #2   Title Pt will use memory/attention compensations for appointments, medicine management, and other daily activities with occasional min A over 3 sessions.    Baseline 05-12-21    Status Partially Met      SLP SHORT TERM GOAL #3   Title Pt will  ID and correct word finding errors with 80% accuracy given occasional min A over 2 sessions    Baseline 05-12-21    Status Partially Met      SLP SHORT TERM GOAL #4   Title Pt will verbalize 3 safety concerns in the home and generate solutions to them with occasional min A over 2 sessions    Baseline 05-12-21    Status Partially Met      SLP SHORT TERM GOAL #  5   Title Pt will complete dysphagia HEP to optimize swallow function with occasional min A over 2 sessions    Status Not Met      SLP SHORT TERM GOAL #6   Title Pt will exhibit improved oral coordination on targeted apraxia tasks with occasional min A over 2 sessions    Status Not Met              SLP Long Term Goals - 05/19/21 1304       SLP LONG TERM GOAL #1   Title Pt will demonstrate dysarthria compensations in 15 minute simple to mod complex conversation with min A over 2 sessions    Time 4    Period Weeks    Status On-going      SLP LONG TERM GOAL #2   Title Pt will use memory/attention compensations for appointments, medicine management, and other daily activities with rare min A over 3 sessions.    Time 4    Period Weeks    Status On-going      SLP LONG TERM GOAL #3   Title Pt will ID and correct word finding errors with 95% accuracy given occasional min A over 2 sessions    Time 4    Period Weeks    Status On-going      SLP LONG TERM GOAL #4   Title Pt will report improved diet tolerance and reduced s/sx of aspiration with rare min over 2 sessions    Time 4    Period Weeks    Status On-going      SLP LONG TERM GOAL #5   Title Pt will undergo repeat objective swallow study as needed if s/sx of aspiration persist    Time 4    Period Weeks    Status On-going              Plan - 05/19/21 1303     Clinical Impression Statement Aseret cont to present with changes in speech, cognitive communication, and swallowing s/p CVA in February 2022. Ongoing education and training provided to maximize and  coordinate breath support to address verbal apraxia and dysarthria, as pt expressed concern for family members unable to understand her. SLP re-educated patient on dysarthria compensations, in which demonstration was intermittently impacted by breath support. SLP recommends continuing to target breathing exercises at home using most recent simplified educational handout. Skilled ST is cont'd recommended to address speech, cognitive communication, and dysphagia impacting pt's overall safety and functional independence compared to baseline.    Speech Therapy Frequency 2x / week    Duration 8 weeks   or 17 total visits   Treatment/Interventions Aspiration precaution training;Oral motor exercises;Compensatory strategies;Pharyngeal strengthening exercises;Functional tasks;Patient/family education;Diet toleration management by SLP;Cognitive reorganization;Multimodal communcation approach;Language facilitation;Compensatory techniques;Internal/external aids;SLP instruction and feedback    Potential to Achieve Goals Fair    Potential Considerations Ability to learn/carryover information;Severity of impairments    SLP Home Exercise Plan provided    Consulted and Agree with Plan of Care Patient             Patient will benefit from skilled therapeutic intervention in order to improve the following deficits and impairments:   Verbal apraxia  Dysarthria and anarthria  Dysphagia, unspecified type  Cognitive communication deficit  Aphasia    Problem List Patient Active Problem List   Diagnosis Date Noted   Acute CVA (cerebrovascular accident) ([place]) 12/29/2020   Peripheral arterial disease ([place]) 12/20/2020   History of esophageal stricture 07/08/2020  Insomnia 12/02/2017   Chronic cough 08/14/2017   Poor appetite 08/29/2016   Peptic stricture of esophagus    History of colonic polyps    Hiatal hernia 03/16/2016   Odynophagia 03/16/2016   Dysphagia 03/16/2016   Alcohol abuse 10/26/2015    Essential hypertension 09/26/2015   PAC (premature atrial contraction) 10/11/2014   PVC (premature ventricular contraction) 10/11/2014   Back muscle spasm 06/15/2014   Generalized anxiety disorder 06/15/2014   Smoking 06/15/2014   Chronic hepatitis C without hepatic coma ([place]) 05/28/2013    [place] ,[facility] [place], [place]  05/19/2021, 1:05 PM  [facility] [address] [place], [place], [address] Phone: [phone]   Fax:  [phone]   Name: Kara [place] MRN: [id number] Date of Birth: [birth year]

## 2021-05-23 ENCOUNTER — Other Ambulatory Visit: Payer: Self-pay

## 2021-05-23 ENCOUNTER — Encounter: Payer: Self-pay | Admitting: Physical Therapy

## 2021-05-23 ENCOUNTER — Ambulatory Visit: Payer: Medicare HMO

## 2021-05-23 ENCOUNTER — Ambulatory Visit: Payer: Medicare HMO | Admitting: Physical Therapy

## 2021-05-23 DIAGNOSIS — R29818 Other symptoms and signs involving the nervous system: Secondary | ICD-10-CM

## 2021-05-23 DIAGNOSIS — R2681 Unsteadiness on feet: Secondary | ICD-10-CM

## 2021-05-23 DIAGNOSIS — M6281 Muscle weakness (generalized): Secondary | ICD-10-CM

## 2021-05-23 DIAGNOSIS — R482 Apraxia: Secondary | ICD-10-CM

## 2021-05-23 DIAGNOSIS — R471 Dysarthria and anarthria: Secondary | ICD-10-CM

## 2021-05-23 DIAGNOSIS — R1310 Dysphagia, unspecified: Secondary | ICD-10-CM

## 2021-05-23 NOTE — Patient Instructions (Addendum)
  Bring [employer]eps[employer] bottle to therapy on Friday as you are reporting more trouble swallowing soda    Hard swallows:  -swallow your saliva or small sip of water as HARD as you can. Do this 10 times, twice a day  [place] swallow (tongue out):  -swallow with your tongue between lips/teeth. Make sure your tongue stays there while you swallow. Do this 10 times a day, twice a day

## 2021-05-23 NOTE — Therapy (Addendum)
[facility] [address] [place], [place], [address] Phone: [phone]   Fax:  [phone]  Speech Language Pathology Treatment  Patient Details  Name: Kara Bailey MRN: [id number] Date of Birth: 1956-01-26 Referring Provider (SLP): Cipriano Mile, NP   Encounter Date: 05/23/2021   End of Session - 05/23/21 1206     Visit Number 11    Number of Visits 17    Date for SLP Re-Evaluation 06/09/21    Authorization Type [employer]    Authorization Time Period 04-14-21 to 06-09-21 (9 visits); (05-19-21 to 06-09-21) (9 more visits) 18 total visits    Authorization - Visit Number 11    Authorization - Number of Visits 18   per Jenny Reichmann on 05-16-21   SLP Start Time 1100    SLP Stop Time  1660    SLP Time Calculation (min) 45 min    Activity Tolerance Patient tolerated treatment well             Past Medical History:  Diagnosis Date   Anxiety    Chest pain 08/2014   unspecified   Cirrhosis ([place])    Depression    Dizziness 08/2014   Dyspnea 08/2014   Emphysema lung ([facility])    GERD (gastroesophageal reflux disease)    Heart murmur    Hepatitis C    Hiatal hernia    Hypertension    PAC (premature atrial contraction) 10/11/2014   PAD (peripheral artery disease) (HCC)    Palpitations    PVC (premature ventricular contraction) 10/11/2014   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   Stroke ([place])    just slurred speech   Tachycardia 08/2014   Ventral hernia    Weakness 08/2014    Past Surgical History:  Procedure Laterality Date   APPENDECTOMY     CATARACT EXTRACTION, BILATERAL     COLONOSCOPY     COLONOSCOPY WITH PROPOFOL N/A 04/30/2016   Procedure: COLONOSCOPY WITH PROPOFOL;  Surgeon: Daneil Dolin, MD;  [facility];  Service: Endoscopy;  Laterality: N/A;  1230   ESOPHAGOGASTRODUODENOSCOPY     approximately 2010   ESOPHAGOGASTRODUODENOSCOPY (EGD) WITH PROPOFOL N/A 04/30/2016    Procedure: ESOPHAGOGASTRODUODENOSCOPY (EGD) WITH PROPOFOL;  Surgeon: Daneil Dolin, MD;  [facility];  Service: Endoscopy;  Laterality: N/A;   LEFT HEART CATHETERIZATION WITH CORONARY ANGIOGRAM N/A 10/29/2014   07-14-20- pt denies this Procedure: LEFT HEART CATHETERIZATION WITH CORONARY ANGIOGRAM;  Surgeon: Burnell Blanks, MD;  Location: [facility] CATH LAB;  Service: Cardiovascular;  Laterality: N/A;   MALONEY DILATION N/A 04/30/2016   Procedure: Venia Minks DILATION;  Surgeon: Daneil Dolin, MD;  [facility];  Service: Endoscopy;  Laterality: N/A;   POLYPECTOMY  04/30/2016   Procedure: POLYPECTOMY;  Surgeon: Daneil Dolin, MD;  [facility];  Service: Endoscopy;;  Sigmoid colon polyp removed via hot snare   UPPER GASTROINTESTINAL ENDOSCOPY      There were no vitals filed for this visit.   Subjective Assessment - 05/23/21 1201     Subjective "I read some words"    Currently in Pain? No/denies                   ADULT SLP TREATMENT - 05/23/21 1100       General Information   Behavior/Cognition Alert;Cooperative;Pleasant mood      Treatment Provided   Treatment provided Cognitive-Linquistic;Dysphagia      Dysphagia Treatment   Treatment Methods Therapeutic  exercise;Compensation strategy training;Patient/caregiver education    Patient observed directly with PO's Yes    Type of PO's observed Thin liquids    Liquids provided via Cup    Amount of cueing Minimal    Other treatment/comments Pt reported inconsistent use of straw and increased difficulty with "heavy liquids" such as Pepsi. Pt endorses improved tolerance of water. SLP suggested patient bring soda next session to further assess. SLP introduced and educated patient on effortful swallows and Masako swallow exercises to target motor control and coordination. Usual liquid washes required due to dry mouth. Pt able to perform exercises with occasional min A. SLP reiterated "small sips" with all  liquids via cup/straw/bottle, due to reported fear of choking.      Cognitive-Linquistic Treatment   Treatment focused on Apraxia;Dysarthria    Skilled Treatment Inconsistent completion of HEP reported since last ST session targeting apraxia and coordination of speaking on exhalation. At word and sentence level, pt able to demo speaking at top of breath with ~40% accuracy. Verbal and visual re-education and cues provided to promote increased understanding and carryover, which was occasionally effective. Intermittent cues required to reduce tension in upper body.      Assessment / Recommendations / Plan   Plan Continue with current plan of care      Progression Toward Goals   Progression toward goals Progressing toward goals              SLP Education - 05/23/21 1137     Education Details swallow exercises, recommendations for taking medications (2-3 at a time, puree)   Person(s) Educated Patient    Methods Explanation;Demonstration;Handout    Comprehension Verbalized understanding;Returned demonstration;Need further instruction              SLP Short Term Goals - 05/19/21 1303       SLP SHORT TERM GOAL #1   Title Pt will demonstrate dysarthria compensations in 10 minute simple to mod complex conversation with min A over 2 sessions    Status Not Met      SLP SHORT TERM GOAL #2   Title Pt will use memory/attention compensations for appointments, medicine management, and other daily activities with occasional min A over 3 sessions.    Baseline 05-12-21    Status Partially Met      SLP SHORT TERM GOAL #3   Title Pt will ID and correct word finding errors with 80% accuracy given occasional min A over 2 sessions    Baseline 05-12-21    Status Partially Met      SLP SHORT TERM GOAL #4   Title Pt will verbalize 3 safety concerns in the home and generate solutions to them with occasional min A over 2 sessions    Baseline 05-12-21    Status Partially Met      SLP SHORT TERM GOAL  #5   Title Pt will complete dysphagia HEP to optimize swallow function with occasional min A over 2 sessions    Status Not Met      SLP SHORT TERM GOAL #6   Title Pt will exhibit improved oral coordination on targeted apraxia tasks with occasional min A over 2 sessions    Status Not Met              SLP Long Term Goals - 05/23/21 1139       SLP LONG TERM GOAL #1   Title Pt will demonstrate dysarthria compensations in 15 minute simple to mod complex conversation with  min A over 2 sessions    Time 3    Period Weeks    Status On-going      SLP LONG TERM GOAL #2   Title Pt will use memory/attention compensations for appointments, medicine management, and other daily activities with rare min A over 3 sessions.    Time 3    Period Weeks    Status On-going      SLP LONG TERM GOAL #3   Title Pt will ID and correct word finding errors with 95% accuracy given occasional min A over 2 sessions    Time 3    Period Weeks    Status On-going      SLP LONG TERM GOAL #4   Title Pt will report improved diet tolerance and reduced s/sx of aspiration with rare min over 2 sessions    Time 3    Period Weeks    Status On-going      SLP LONG TERM GOAL #5   Title Pt will undergo repeat objective swallow study as needed if s/sx of aspiration persist    Time 3    Period Weeks    Status On-going              Plan - 05/23/21 1207     Clinical Impression Statement Suman cont to present with changes in speech, cognitive communication, and swallowing s/p CVA in February 2022. Ongoing education and training provided to maximize and coordinate breath support to address verbal apraxia and dysarthria, as pt expressed concern for family members unable to understand her. SLP educated patient on swallow exercises (effortful and Masako) due to occasional coughing indicated and fear of choking reported. Skilled ST is cont'd recommended to address speech, cognitive communication, and dysphagia impacting  pt's overall safety and functional independence compared to baseline.    Speech Therapy Frequency 2x / week    Duration 8 weeks    Treatment/Interventions Aspiration precaution training;Oral motor exercises;Compensatory strategies;Pharyngeal strengthening exercises;Functional tasks;Patient/family education;Diet toleration management by SLP;Cognitive reorganization;Multimodal communcation approach;Language facilitation;Compensatory techniques;Internal/external aids;SLP instruction and feedback    Potential to Achieve Goals Fair    Potential Considerations Ability to learn/carryover information;Severity of impairments    SLP Home Exercise Plan provided    Consulted and Agree with Plan of Care Patient             Patient will benefit from skilled therapeutic intervention in order to improve the following deficits and impairments:   Dysphagia, unspecified type  Verbal apraxia  Dysarthria and anarthria    Problem List Patient Active Problem List   Diagnosis Date Noted   Acute CVA (cerebrovascular accident) ([place]) 12/29/2020   Peripheral arterial disease ([place]) 12/20/2020   History of esophageal stricture 07/08/2020   Insomnia 12/02/2017   Chronic cough 08/14/2017   Poor appetite 08/29/2016   Peptic stricture of esophagus    History of colonic polyps    Hiatal hernia 03/16/2016   Odynophagia 03/16/2016   Dysphagia 03/16/2016   Alcohol abuse 10/26/2015   Essential hypertension 09/26/2015   PAC (premature atrial contraction) 10/11/2014   PVC (premature ventricular contraction) 10/11/2014   Back muscle spasm 06/15/2014   Generalized anxiety disorder 06/15/2014   Smoking 06/15/2014   Chronic hepatitis C without hepatic coma ([place]) 05/28/2013    Alinda Deem, MA CCC-SLP 05/23/2021, 2:00 PM  [facility] [address] [place], [place], [address] Phone: [phone]   Fax:  [phone]   Name: ALYSSAMAE KLINCK MRN:  [id number] Date of Birth: December 11, 1955

## 2021-05-23 NOTE — Therapy (Addendum)
[facility] [address] [place], [place], [address] Phone: [phone]   Fax:  [phone]  Physical Therapy Treatment/10th visit Progress Note   Patient Details  Name: Kara Bailey MRN: [id number] Date of Birth: Dec 24, 1955 Referring Provider (PT): Cipriano Mile, NP  10th Visit Physical Therapy Progress Note  Dates of Reporting Period: 04/18/21 to 05/23/21    Encounter Date: 05/23/2021   PT End of Session - 05/23/21 1100     Visit Number 10    Number of Visits 17    Date for PT Re-Evaluation 07/17/21   written for 60 day POC   Authorization Type [employer] - auth from 6/7-07/17/21    PT Start Time 1017    PT Stop Time 1058    PT Time Calculation (min) 41 min    Equipment Utilized During Treatment Gait belt    Activity Tolerance Patient tolerated treatment well    Behavior During Therapy [facility] for tasks assessed/performed             Past Medical History:  Diagnosis Date   Anxiety    Chest pain 08/2014   unspecified   Cirrhosis ([place])    Depression    Dizziness 08/2014   Dyspnea 08/2014   Emphysema lung (HCC)    GERD (gastroesophageal reflux disease)    Heart murmur    Hepatitis C    Hiatal hernia    Hypertension    PAC (premature atrial contraction) 10/11/2014   PAD (peripheral artery disease) (HCC)    Palpitations    PVC (premature ventricular contraction) 10/11/2014   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   Stroke ([place])    just slurred speech   Tachycardia 08/2014   Ventral hernia    Weakness 08/2014    Past Surgical History:  Procedure Laterality Date   APPENDECTOMY     CATARACT EXTRACTION, BILATERAL     COLONOSCOPY     COLONOSCOPY WITH PROPOFOL N/A 04/30/2016   Procedure: COLONOSCOPY WITH PROPOFOL;  Surgeon: Daneil Dolin, MD;  [facility];  Service: Endoscopy;  Laterality: N/A;  1230   ESOPHAGOGASTRODUODENOSCOPY     approximately 2010    ESOPHAGOGASTRODUODENOSCOPY (EGD) WITH PROPOFOL N/A 04/30/2016   Procedure: ESOPHAGOGASTRODUODENOSCOPY (EGD) WITH PROPOFOL;  Surgeon: Daneil Dolin, MD;  [facility];  Service: Endoscopy;  Laterality: N/A;   LEFT HEART CATHETERIZATION WITH CORONARY ANGIOGRAM N/A 10/29/2014   07-14-20- pt denies this Procedure: LEFT HEART CATHETERIZATION WITH CORONARY ANGIOGRAM;  Surgeon: Burnell Blanks, MD;  Location: [facility] CATH LAB;  Service: Cardiovascular;  Laterality: N/A;   MALONEY DILATION N/A 04/30/2016   Procedure: Venia Minks DILATION;  Surgeon: Daneil Dolin, MD;  [facility];  Service: Endoscopy;  Laterality: N/A;   POLYPECTOMY  04/30/2016   Procedure: POLYPECTOMY;  Surgeon: Daneil Dolin, MD;  [facility];  Service: Endoscopy;;  Sigmoid colon polyp removed via hot snare   UPPER GASTROINTESTINAL ENDOSCOPY      There were no vitals filed for this visit.   Subjective Assessment - 05/23/21 1020     Subjective No falls. Has not yet ordered the foot up brace yet - will be able to order next month when she gets some money.    Pertinent History smoking, PAD, HTN, ETOH abuse, chronic hepatitis C with hepatic coma,GAD, esophageal stricture    Limitations Walking;Standing    Patient Stated Goals wants to work on walking normally    Currently in Pain? No/denies  [facility] Adult PT Treatment/Exercise - 05/23/21 1025       Transfers   Transfers Sit to Stand;Stand to Sit    Sit to Stand 5: Supervision;With upper extremity assist    Stand to Sit 5: Supervision;With upper extremity assist    Comments x5 reps from standard height chair with UE support, cues for equal BOS (instead of RLE staggered posteriorly), forward weight shift, cues to hinge from hips when sitting back down      Ambulation/Gait   Ambulation/Gait Yes    Ambulation/Gait Assistance 5: Supervision;4: Min guard    Ambulation/Gait Assistance Details used L foot  up brace again today, with pt having improved L ankle DF and foot clerance, therapist providing tactile cues at pelvis for incr stance time on LLE. also trialed on RLE (short distances 2 x 50') with pt also appearing to benefit with RLE for heel strike and incr foot clearance. pt asking about using it on both feet, discussed with pt would have to buy 2 of them. at end of session with no foot up brace, pt with incr circumduction of RLE and decr foot clearance when more fatigued, discussed may benefit more using on RLE, will continue to address    Ambulation Distance (Feet) 115 Feet   x2, plus an additional x50'   Assistive device None    Gait Pattern Decreased arm swing - right;Step-through pattern;Decreased stance time - right;Decreased hip/knee flexion - right;Decreased dorsiflexion - right;Poor foot clearance - right;Lateral trunk lean to right    Ambulation Surface Level;Indoor                 Balance Exercises - 05/23/21 1043       Balance Exercises: Standing   SLS with Vectors Solid surface;Upper extremity assist 2;Limitations    SLS with Vectors Limitations standing on LLE as stance leg x10 reps tapping RLE to 4" step, cues for glute activation and posture, repeated with RLE as stance leg    Step Ups Forward;4 inch;UE support 2;Limitations    Step Ups Limitations Forward step up/up, down/down x 5 reps alternating legs, cues for sequence and hip/knee flexion                 PT Short Term Goals - 05/12/21 1012       PT SHORT TERM GOAL #1   Title Pt will be independent with initial HEP in order to build upon functional gains made in therapy. ALL STGS DUE 05/16/21    Baseline needing reminder cues for technique    Time 4    Period Weeks    Status Partially Met    Target Date 05/16/21      PT SHORT TERM GOAL #2   Title Pt will finish BERG assessment to determine fall risk with LTG written    Baseline 43/56 - LTG written    Time 4    Period Weeks    Status Achieved       PT SHORT TERM GOAL #3   Title Pt will decr TUG time to 17 seconds or less with no AD in order to demo decr fall risk.    Baseline 20.47 seconds; 16.18 ON 05/09/21    Time 4    Period Weeks    Status Achieved      PT SHORT TERM GOAL #4   Title Pt will improve gait speed to at least 2.2 ft/sec with no AD vs. LRAD in order to demo decr fall risk.    Baseline  1.97 ft/sec; 1.83 ft/sec with no AD    Time 4    Period Weeks    Status Not Met      PT SHORT TERM GOAL #5   Title Pt will decr 5x sit <> stand time to 50 seconds or less in order to demo improved BLE strength and balance.    Baseline 64.32 seconds no UE support; 31.47 seconds on 05/09/21    Time 4    Period Weeks    Status Achieved               PT Long Term Goals - 05/09/21 1023       PT LONG TERM GOAL #1   Title Pt will improve BERG score to at least a 48/56 in order to demo decr fall risk. ALL LTGS DUE 06/13/21    Baseline 43/56    Time 8    Period Weeks    Status Revised      PT LONG TERM GOAL #2   Title Pt will go ascend and descend 4 steps with single handrail vs. use of LRAD with supervision in order to be able to go to her son's house.    Baseline can not perform stairs    Time 8    Period Weeks    Status New      PT LONG TERM GOAL #3   Title Pt will decr 5x sit <> stand time to 25 seconds or less in order to demo improved BLE strength and balance.    Baseline 64.32 seconds no UE support; 31.47 seconds on 05/09/21 with no UE support    Time 8    Period Weeks    Status Revised      PT LONG TERM GOAL #4   Title Pt will ambulate at least 300' outdoors over unlevel paved surfaces with LRAD in order to demo improved community mobility.    Baseline not yet assessed.    Time 8    Period Weeks    Status New      PT LONG TERM GOAL #5   Title Pt will decr TUG time to 13.5 seconds or less with no AD in order to demo decr fall risk.    Baseline 20.47 seconds; 16.18 ON 05/09/21    Time 8    Period Weeks     Status Revised      PT LONG TERM GOAL #6   Title Pt will improve FOTO score to at least 60% in order to demo improved functional outcomes.    Baseline 50%    Time 8    Period Weeks    Status New                  Plan - 05/23/21 1442     Clinical Impression Statement 10th visit PN: STGs assessed on 05/09/21 and 05/12/21 - pt improved TUG to 16.18 seconds with no AD (previously 20.47 seconds) and 5x sit <> stand without UE support to 31.47 seconds (previously 64.32 seconds). Pt's gait speed of 1.83 ft/sec with no AD indicates that pt is a limited community ambulator. Continued to use L foot up brace today with therapist providing cues for incr weight shift to LLE during gait. Also tried using foot up brace on RLE with pt also demonstrating improvements in her gait, might benefit more with foot up brace on this side esp when more fatigued. Will continue to address.    Personal Factors and Comorbidities Comorbidity 3+;Past/Current Experience  Comorbidities smoking, PAD, HTN, ETOH abuse, chronic hepatitis C with hepatic coma,GAD, esophageal stricture    Examination-Activity Limitations Locomotion Level;Transfers;Stairs;Squat;Carry    Examination-Participation Restrictions Cleaning;Community Activity;Laundry    Stability/Clinical Decision Making Stable/Uncomplicated    Rehab Potential Good    PT Frequency 2x / week    PT Duration 8 weeks    PT Treatment/Interventions ADLs/Self Care Home Management;DME Instruction;Gait training;Stair training;Therapeutic activities;Functional mobility training;Therapeutic exercise;Balance training;Neuromuscular re-education;Orthotic Fit/Training;Patient/family education;Vestibular    PT Next Visit Plan assess any leg length discrepancy? continue to work with foot up brace with RLE(?has pt ordered for home yet). to work on BLE strengthening, standing balance (work EC, narrow BOS, SLS tasks, head motions), sit <> stand training. stair training.    PT Home  Exercise Plan [facility]    Consulted and Agree with Plan of Care Patient             Patient will benefit from skilled therapeutic intervention in order to improve the following deficits and impairments:  Abnormal gait, Decreased activity tolerance, Decreased balance, Decreased coordination, Decreased endurance, Decreased knowledge of use of DME, Difficulty walking, Decreased strength, Dizziness, Impaired sensation, Postural dysfunction  Visit Diagnosis: Unsteadiness on feet  Muscle weakness (generalized)  Other symptoms and signs involving the nervous system     Problem List Patient Active Problem List   Diagnosis Date Noted   Acute CVA (cerebrovascular accident) ([place]) 12/29/2020   Peripheral arterial disease ([place]) 12/20/2020   History of esophageal stricture 07/08/2020   Insomnia 12/02/2017   Chronic cough 08/14/2017   Poor appetite 08/29/2016   Peptic stricture of esophagus    History of colonic polyps    Hiatal hernia 03/16/2016   Odynophagia 03/16/2016   Dysphagia 03/16/2016   Alcohol abuse 10/26/2015   Essential hypertension 09/26/2015   PAC (premature atrial contraction) 10/11/2014   PVC (premature ventricular contraction) 10/11/2014   Back muscle spasm 06/15/2014   Generalized anxiety disorder 06/15/2014   Smoking 06/15/2014   Chronic hepatitis C without hepatic coma ([place]) 05/28/2013    Arliss Journey, PT, DPT  05/23/2021, 2:43 PM  [facility] [address] [place], [place], [address] Phone: [phone]   Fax:  [phone]  Name: Kara Bailey MRN: [id number] Date of Birth: [birth year]

## 2021-05-26 ENCOUNTER — Ambulatory Visit: Payer: Medicare HMO | Admitting: Physical Therapy

## 2021-05-26 ENCOUNTER — Ambulatory Visit: Payer: Medicare HMO

## 2021-05-26 ENCOUNTER — Other Ambulatory Visit: Payer: Self-pay

## 2021-05-26 ENCOUNTER — Encounter: Payer: Self-pay | Admitting: Physical Therapy

## 2021-05-26 DIAGNOSIS — M6281 Muscle weakness (generalized): Secondary | ICD-10-CM

## 2021-05-26 DIAGNOSIS — R2681 Unsteadiness on feet: Secondary | ICD-10-CM | POA: Diagnosis not present

## 2021-05-26 DIAGNOSIS — R29818 Other symptoms and signs involving the nervous system: Secondary | ICD-10-CM

## 2021-05-26 DIAGNOSIS — R1310 Dysphagia, unspecified: Secondary | ICD-10-CM

## 2021-05-26 DIAGNOSIS — R482 Apraxia: Secondary | ICD-10-CM

## 2021-05-26 NOTE — Therapy (Signed)
[facility] [address] [place], [place], [address] Phone: [phone]   Fax:  [phone]  Physical Therapy Treatment  Patient Details  Name: Kara Bailey MRN: [id number] Date of Birth: 1956/04/11 Referring Provider (PT): Cipriano Mile, NP   Encounter Date: 05/26/2021   PT End of Session - 05/26/21 1058     Visit Number 11    Number of Visits 17    Date for PT Re-Evaluation 07/17/21   written for 60 day POC   Authorization Type [employer] - auth from 6/7-07/17/21    PT Start Time 1018    PT Stop Time 1059    PT Time Calculation (min) 41 min    Equipment Utilized During Treatment Gait belt    Activity Tolerance Patient tolerated treatment well    Behavior During Therapy [facility] for tasks assessed/performed             Past Medical History:  Diagnosis Date   Anxiety    Chest pain 08/2014   unspecified   Cirrhosis ([place])    Depression    Dizziness 08/2014   Dyspnea 08/2014   Emphysema lung ([place])    GERD (gastroesophageal reflux disease)    Heart murmur    Hepatitis C    Hiatal hernia    Hypertension    PAC (premature atrial contraction) 10/11/2014   PAD (peripheral artery disease) (HCC)    Palpitations    PVC (premature ventricular contraction) 10/11/2014   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   Stroke ([place])    just slurred speech   Tachycardia 08/2014   Ventral hernia    Weakness 08/2014    Past Surgical History:  Procedure Laterality Date   APPENDECTOMY     CATARACT EXTRACTION, BILATERAL     COLONOSCOPY     COLONOSCOPY WITH PROPOFOL N/A 04/30/2016   Procedure: COLONOSCOPY WITH PROPOFOL;  Surgeon: Daneil Dolin, MD;  [facility];  Service: Endoscopy;  Laterality: N/A;  1230   ESOPHAGOGASTRODUODENOSCOPY     approximately 2010   ESOPHAGOGASTRODUODENOSCOPY (EGD) WITH PROPOFOL N/A 04/30/2016   Procedure: ESOPHAGOGASTRODUODENOSCOPY (EGD) WITH PROPOFOL;  Surgeon:  Daneil Dolin, MD;  [facility];  Service: Endoscopy;  Laterality: N/A;   LEFT HEART CATHETERIZATION WITH CORONARY ANGIOGRAM N/A 10/29/2014   07-14-20- pt denies this Procedure: LEFT HEART CATHETERIZATION WITH CORONARY ANGIOGRAM;  Surgeon: Burnell Blanks, MD;  Location: [facility] CATH LAB;  Service: Cardiovascular;  Laterality: N/A;   MALONEY DILATION N/A 04/30/2016   Procedure: Venia Minks DILATION;  Surgeon: Daneil Dolin, MD;  [facility];  Service: Endoscopy;  Laterality: N/A;   POLYPECTOMY  04/30/2016   Procedure: POLYPECTOMY;  Surgeon: Daneil Dolin, MD;  [facility];  Service: Endoscopy;;  Sigmoid colon polyp removed via hot snare   UPPER GASTROINTESTINAL ENDOSCOPY      There were no vitals filed for this visit.   Subjective Assessment - 05/26/21 1020     Subjective Has been doing her exercises 2x a day. No falls.    Pertinent History smoking, PAD, HTN, ETOH abuse, chronic hepatitis C with hepatic coma,GAD, esophageal stricture    Limitations Walking;Standing    Patient Stated Goals wants to work on walking normally    Currently in Pain? No/denies                               [facility] Adult PT Treatment/Exercise -  05/26/21 1022       Therapeutic Activites    Therapeutic Activities Other Therapeutic Activities    Other Therapeutic Activities measured potential leg length discrepancy (at end of last session pt reporting legs feeling different lengths), had pt perform 3 bridges and measured leg length at 84 cm each side from ASIS to medial malleolus. Did note incr tightness into L ankle DF.      Knee/Hip Exercises: Stretches   Press photographer 2 reps;Both;30 seconds    Gastroc Stretch Limitations standing at countertop - added to HEP, verbal and tactile cues for proper technique      Knee/Hip Exercises: Aerobic   Stepper Seated SciFit Stepper with BLE/BUE at gear 1.5 for strengthening/ROM/activity tolerance for 6 minutes - one  instance with pt using BLE only for 30 seconds due to BUE fatigue. pt reports using her cubi 3 times a week      Knee/Hip Exercises: Sidelying   Clams x5 reps then x10 reps with RLE, 2 x 5 reps B with LLE, therapist helping pt maintain proper position                 Balance Exercises - 05/26/21 0001       Balance Exercises: Standing   SLS with Vectors Solid surface    SLS with Vectors Limitations alternating legs tapping feet to 4" aerobic step without UE support x10 reps each leg - cues for glute and core activation    Step Ups Forward;4 inch;UE support 1    Step Ups Limitations Forward step up/up, down/down x8 reps alternating legs, cues for sequencing               PT Education - 05/26/21 1057     Education Details standing gastroc stretch addition to HEP    Person(s) Educated Patient    Methods Explanation;Demonstration;Handout    Comprehension Verbalized understanding;Returned demonstration              PT Short Term Goals - 05/12/21 1012       PT SHORT TERM GOAL #1   Title Pt will be independent with initial HEP in order to build upon functional gains made in therapy. ALL STGS DUE 05/16/21    Baseline needing reminder cues for technique    Time 4    Period Weeks    Status Partially Met    Target Date 05/16/21      PT SHORT TERM GOAL #2   Title Pt will finish BERG assessment to determine fall risk with LTG written    Baseline 43/56 - LTG written    Time 4    Period Weeks    Status Achieved      PT SHORT TERM GOAL #3   Title Pt will decr TUG time to 17 seconds or less with no AD in order to demo decr fall risk.    Baseline 20.47 seconds; 16.18 ON 05/09/21    Time 4    Period Weeks    Status Achieved      PT SHORT TERM GOAL #4   Title Pt will improve gait speed to at least 2.2 ft/sec with no AD vs. LRAD in order to demo decr fall risk.    Baseline 1.97 ft/sec; 1.83 ft/sec with no AD    Time 4    Period Weeks    Status Not Met      PT SHORT  TERM GOAL #5   Title Pt will decr 5x sit <> stand time to  50 seconds or less in order to demo improved BLE strength and balance.    Baseline 64.32 seconds no UE support; 31.47 seconds on 05/09/21    Time 4    Period Weeks    Status Achieved               PT Long Term Goals - 05/09/21 1023       PT LONG TERM GOAL #1   Title Pt will improve BERG score to at least a 48/56 in order to demo decr fall risk. ALL LTGS DUE 06/13/21    Baseline 43/56    Time 8    Period Weeks    Status Revised      PT LONG TERM GOAL #2   Title Pt will go ascend and descend 4 steps with single handrail vs. use of LRAD with supervision in order to be able to go to her son's house.    Baseline can not perform stairs    Time 8    Period Weeks    Status New      PT LONG TERM GOAL #3   Title Pt will decr 5x sit <> stand time to 25 seconds or less in order to demo improved BLE strength and balance.    Baseline 64.32 seconds no UE support; 31.47 seconds on 05/09/21 with no UE support    Time 8    Period Weeks    Status Revised      PT LONG TERM GOAL #4   Title Pt will ambulate at least 300' outdoors over unlevel paved surfaces with LRAD in order to demo improved community mobility.    Baseline not yet assessed.    Time 8    Period Weeks    Status New      PT LONG TERM GOAL #5   Title Pt will decr TUG time to 13.5 seconds or less with no AD in order to demo decr fall risk.    Baseline 20.47 seconds; 16.18 ON 05/09/21    Time 8    Period Weeks    Status Revised      PT LONG TERM GOAL #6   Title Pt will improve FOTO score to at least 60% in order to demo improved functional outcomes.    Baseline 50%    Time 8    Period Weeks    Status New                   Plan - 05/26/21 1213     Clinical Impression Statement Assessed leg length discrepancy today with pt lying supine with both legs equal lengths when measured. Did notice incr L ankle tightness in this position with added B calf stretch  to pt's HEP. Pt fatigues easier with exercises when using LLE esp with clamshells. Will continue to progress towards LTGs.    Personal Factors and Comorbidities Comorbidity 3+;Past/Current Experience    Comorbidities smoking, PAD, HTN, ETOH abuse, chronic hepatitis C with hepatic coma,GAD, esophageal stricture    Examination-Activity Limitations Locomotion Level;Transfers;Stairs;Squat;Carry    Examination-Participation Restrictions Cleaning;Community Activity;Laundry    Stability/Clinical Decision Making Stable/Uncomplicated    Rehab Potential Good    PT Frequency 2x / week    PT Duration 8 weeks    PT Treatment/Interventions ADLs/Self Care Home Management;DME Instruction;Gait training;Stair training;Therapeutic activities;Functional mobility training;Therapeutic exercise;Balance training;Neuromuscular re-education;Orthotic Fit/Training;Patient/family education;Vestibular    PT Next Visit Plan continue to work with foot up brace with RLE vs. LLE - pt to order  in august due to financials.continue to work on BLE strengthening, standing balance (work EC, narrow BOS, SLS tasks, head motions), sit <> stand training. stair training.    PT Home Exercise Plan [facility]    Consulted and Agree with Plan of Care Patient             Patient will benefit from skilled therapeutic intervention in order to improve the following deficits and impairments:  Abnormal gait, Decreased activity tolerance, Decreased balance, Decreased coordination, Decreased endurance, Decreased knowledge of use of DME, Difficulty walking, Decreased strength, Dizziness, Impaired sensation, Postural dysfunction  Visit Diagnosis: Unsteadiness on feet  Muscle weakness (generalized)  Other symptoms and signs involving the nervous system     Problem List Patient Active Problem List   Diagnosis Date Noted   Acute CVA (cerebrovascular accident) ([place]) 12/29/2020   Peripheral arterial disease ([place]) 12/20/2020   History of  esophageal stricture 07/08/2020   Insomnia 12/02/2017   Chronic cough 08/14/2017   Poor appetite 08/29/2016   Peptic stricture of esophagus    History of colonic polyps    Hiatal hernia 03/16/2016   Odynophagia 03/16/2016   Dysphagia 03/16/2016   Alcohol abuse 10/26/2015   Essential hypertension 09/26/2015   PAC (premature atrial contraction) 10/11/2014   PVC (premature ventricular contraction) 10/11/2014   Back muscle spasm 06/15/2014   Generalized anxiety disorder 06/15/2014   Smoking 06/15/2014   Chronic hepatitis C without hepatic coma ([place]) 05/28/2013    Arliss Journey, PT ,DPT  05/26/2021, 12:14 PM  [facility] [address] [place], [place], [address] Phone: [phone]   Fax:  [phone]  Name: Kara Bailey MRN: [id number] Date of Birth: [birth year]

## 2021-05-26 NOTE — Therapy (Signed)
[facility] [address] [place], [place], [address] Phone: [phone]   Fax:  [phone]  Speech Language Pathology Treatment  Patient Details  Name: Kara Bailey MRN: [id number] Date of Birth: 03-Feb-1956 Referring Provider (SLP): Cipriano Mile, NP   Encounter Date: 05/26/2021   End of Session - 05/26/21 1442     Visit Number 12    Number of Visits 17    Date for SLP Re-Evaluation 06/09/21    Authorization Type [employer]    Authorization Time Period 04-14-21 to 06-09-21 (9 visits); (05-19-21 to 06-09-21) (9 more visits) 18 total visits    Authorization - Visit Number 12    Authorization - Number of Visits 18   per Jenny Reichmann on 05-16-21   SLP Start Time 1105    SLP Stop Time  1145    SLP Time Calculation (min) 40 min    Activity Tolerance Patient tolerated treatment well             Past Medical History:  Diagnosis Date   Anxiety    Chest pain 08/2014   unspecified   Cirrhosis (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   Stroke ([place])    just slurred speech   Tachycardia 08/2014   Ventral hernia    Weakness 08/2014    Past Surgical History:  Procedure Laterality Date   APPENDECTOMY     CATARACT EXTRACTION, BILATERAL     COLONOSCOPY     COLONOSCOPY WITH PROPOFOL N/A 04/30/2016   Procedure: COLONOSCOPY WITH PROPOFOL;  Surgeon: Daneil Dolin, MD;  [facility];  Service: Endoscopy;  Laterality: N/A;  1230   ESOPHAGOGASTRODUODENOSCOPY     approximately 2010   ESOPHAGOGASTRODUODENOSCOPY (EGD) WITH PROPOFOL N/A 04/30/2016    Procedure: ESOPHAGOGASTRODUODENOSCOPY (EGD) WITH PROPOFOL;  Surgeon: Daneil Dolin, MD;  [facility];  Service: Endoscopy;  Laterality: N/A;   LEFT HEART CATHETERIZATION WITH CORONARY ANGIOGRAM N/A 10/29/2014   07-14-20- pt denies this Procedure: LEFT HEART CATHETERIZATION WITH CORONARY ANGIOGRAM;  Surgeon: Burnell Blanks, MD;  Location[facility] CATH LAB;  Service: Cardiovascular;  Laterality: N/A;   MALONEY DILATION N/A 04/30/2016   Procedure: Venia Minks DILATION;  Surgeon: Daneil Dolin, MD;  [facility];  Service: Endoscopy;  Laterality: N/A;   POLYPECTOMY  04/30/2016   Procedure: POLYPECTOMY;  Surgeon: Daneil Dolin, MD;  [facility];  Service: Endoscopy;;  Sigmoid colon polyp removed via hot snare   UPPER GASTROINTESTINAL ENDOSCOPY      There were no vitals filed for this visit.   Subjective Assessment - 05/26/21 1108     Subjective Pt brought her Pepsi with her today.                   ADULT SLP TREATMENT - 05/26/21 1120       General Information   Behavior/Cognition Alert;Cooperative;Pleasant mood      Treatment Provided   Treatment provided Cognitive-Linquistic;Dysphagia      Dysphagia Treatment   Other treatment/comments Pt brought Pepsi which she  says she chokes/coughs on frequently at home. Pt eats meals with TV on - TV is situated on the wall to the left and upwards so pt is tilting her head upwards slightly when she eats in order to look at the TV. SLP strongly encouraged pt to turn OFF TV while eating to reduce distractions. Pt agreed. She told SLP she had coughing when she felt liquid "go down this throat" (pt pointed to lt of her thyroid). Becuase of this SLP told pt to take sips and turn head to lt - pt took 7 sips using this technique wihtout difficulty. "It's good now. I know I would have already choked," pt stated after these 7 sips. Later in session, pt tood 5 additional, and then 3 additional sips with left head turn  without any overt s/sx of aspiration. Approx 25% of these sips, SLP noted Kara Bailey with oral holding >2 seconds, once for 7 seconds. She thanked SLP for telling her to turn head to lt with liquids.      Cognitive-Linquistic Treatment   Treatment focused on Apraxia;Dysarthria    Skilled Treatment Conversation of 10 minutes with pt today was functional - pt demonstrated some mild dysfluency. However with homework pt with more difficulty - dysfluency noted on 50% of utterances and pt notably frustrated with some pronounced strain to fight to get words out. SLP cautioned pt that pushing was impeding the flow of her speech and she should instead think relaxed, easy, and gentle. SLP told pt her talking will very likely always be at least a little different than premorbidly. Pt voiced understanding of this.      Assessment / Recommendations / Plan   Plan Continue with current plan of care      Dysphagia Recommendations   Diet recommendations --   as tolerated   Liquids provided via Cup;Straw    Medication Administration --   as tolerated   Postural Changes and/or Swallow Maneuvers Head turn left during swallow      Progression Toward Goals   Progression toward goals Progressing toward goals              SLP Education - 05/26/21 1441     Education Details head turn lt with all liquids, rationale for head turn lt, speech will be different    Person(s) Educated Patient    Methods Explanation;Demonstration;Verbal cues    Comprehension Verbalized understanding;Returned demonstration;Verbal cues required;Need further instruction              SLP Short Term Goals - 05/19/21 1303       SLP SHORT TERM GOAL #1   Title Pt will demonstrate dysarthria compensations in 10 minute simple to mod complex conversation with min A over 2 sessions    Status Not Met      SLP SHORT TERM GOAL #2   Title Pt will use memory/attention compensations for appointments, medicine management, and other daily  activities with occasional min A over 3 sessions.    Baseline 05-12-21    Status Partially Met      SLP SHORT TERM GOAL #3   Title Pt will ID and correct word finding errors with 80% accuracy given occasional min A over 2 sessions    Baseline 05-12-21    Status Partially Met      SLP SHORT TERM GOAL #4   Title Pt will verbalize 3 safety concerns in the home and generate solutions to them with occasional min A over 2 sessions  Baseline 05-12-21    Status Partially Met      SLP SHORT TERM GOAL #5   Title Pt will complete dysphagia HEP to optimize swallow function with occasional min A over 2 sessions    Status Not Met      SLP SHORT TERM GOAL #6   Title Pt will exhibit improved oral coordination on targeted apraxia tasks with occasional min A over 2 sessions    Status Not Met              SLP Long Term Goals - 05/26/21 1444       SLP LONG TERM GOAL #1   Title Pt will demonstrate dysarthria compensations in 15 minute simple to mod complex conversation with min A over 2 sessions    Time 3    Period Weeks    Status On-going      SLP LONG TERM GOAL #2   Title Pt will use memory/attention compensations for appointments, medicine management, and other daily activities with rare min A over 3 sessions.    Time 3    Period Weeks    Status On-going      SLP LONG TERM GOAL #3   Title Pt will ID and correct word finding errors with 95% accuracy given occasional min A over 2 sessions    Time 3    Period Weeks    Status On-going      SLP LONG TERM GOAL #4   Title Pt will report improved diet tolerance and reduced s/sx of aspiration with rare min over 2 sessions    Time 3    Period Weeks    Status On-going      SLP LONG TERM GOAL #5   Title Pt will undergo repeat objective swallow study as needed if s/sx of aspiration persist    Time 3    Period Weeks    Status On-going              Plan - 05/26/21 1443     Clinical Impression Statement Kara Bailey cont to present with  changes in speech, cognitive communication, and swallowing s/p CVA in February 2022. Ongoing education and training provided to maximize and coordinate breath support to address verbal apraxia and dysarthria. SLP educated patient on head turn lt today as compensatory posture to limit pharyngeal dysphagia. Skilled ST is cont'd recommended to address speech, cognitive communication, and dysphagia impacting pt's overall safety and functional independence compared to baseline.    Speech Therapy Frequency 2x / week    Duration 8 weeks    Treatment/Interventions Aspiration precaution training;Oral motor exercises;Compensatory strategies;Pharyngeal strengthening exercises;Functional tasks;Patient/family education;Diet toleration management by SLP;Cognitive reorganization;Multimodal communcation approach;Language facilitation;Compensatory techniques;Internal/external aids;SLP instruction and feedback    Potential to Achieve Goals Fair    Potential Considerations Ability to learn/carryover information;Severity of impairments    SLP Home Exercise Plan provided    Consulted and Agree with Plan of Care Patient             Patient will benefit from skilled therapeutic intervention in order to improve the following deficits and impairments:   Verbal apraxia  Dysphagia, unspecified type    Problem List Patient Active Problem List   Diagnosis Date Noted   Acute CVA (cerebrovascular accident) ([place]) 12/29/2020   Peripheral arterial disease ([place]) 12/20/2020   History of esophageal stricture 07/08/2020   Insomnia 12/02/2017   Chronic cough 08/14/2017   Poor appetite 08/29/2016   Peptic stricture of esophagus    History  of colonic polyps    Hiatal hernia 03/16/2016   Odynophagia 03/16/2016   Dysphagia 03/16/2016   Alcohol abuse 10/26/2015   Essential hypertension 09/26/2015   PAC (premature atrial contraction) 10/11/2014   PVC (premature ventricular contraction) 10/11/2014   Back muscle spasm  06/15/2014   Generalized anxiety disorder 06/15/2014   Smoking 06/15/2014   Chronic hepatitis C without hepatic coma ([place]) 05/28/2013    Shyane Fossum ,[place], [place]  05/26/2021, 2:45 PM  [facility] [address] [place], [place], [address] Phone: [phone]   Fax:  [phone]   Name: Kara Bailey MRN: [id number] Date of Birth: 1956-08-15

## 2021-05-26 NOTE — Patient Instructions (Signed)
Access Code: [facility] URL: [email or web address] Date: 05/26/2021 Prepared by: Janann August  Exercises Supine Bridge - 1 x daily - 5 x weekly - 1 sets - 10 reps Seated Heel Toe Raises - 2 x daily - 5 x weekly - 2 sets - 10 reps Alternating Step Taps with Counter Support - 2 x daily - 5 x weekly - 1 sets - 10 reps Seated Long Arc Quad - 2 x daily - 5 x weekly - 2 sets - 10 reps Standing Romberg to 3/4 Tandem Stance - 1 x daily - 5 x weekly - 3 sets - 20 hold  New addition on 05/26/21:  Standing Gastroc Stretch - 2 x daily - 5 x weekly - 2 sets - 30 hold

## 2021-05-30 ENCOUNTER — Ambulatory Visit: Payer: Medicare HMO | Admitting: Physical Therapy

## 2021-05-30 ENCOUNTER — Ambulatory Visit: Payer: Medicare HMO | Admitting: Occupational Therapy

## 2021-05-30 ENCOUNTER — Ambulatory Visit: Payer: Medicare HMO

## 2021-06-02 ENCOUNTER — Other Ambulatory Visit: Payer: Self-pay

## 2021-06-02 ENCOUNTER — Ambulatory Visit: Payer: Medicare HMO

## 2021-06-02 ENCOUNTER — Ambulatory Visit: Payer: Medicare HMO | Admitting: Physical Therapy

## 2021-06-02 ENCOUNTER — Encounter: Payer: Self-pay | Admitting: Occupational Therapy

## 2021-06-02 ENCOUNTER — Ambulatory Visit: Payer: Medicare HMO | Admitting: Occupational Therapy

## 2021-06-02 DIAGNOSIS — R29818 Other symptoms and signs involving the nervous system: Secondary | ICD-10-CM

## 2021-06-02 DIAGNOSIS — R41841 Cognitive communication deficit: Secondary | ICD-10-CM

## 2021-06-02 DIAGNOSIS — R2681 Unsteadiness on feet: Secondary | ICD-10-CM

## 2021-06-02 DIAGNOSIS — R41840 Attention and concentration deficit: Secondary | ICD-10-CM

## 2021-06-02 DIAGNOSIS — I69851 Hemiplegia and hemiparesis following other cerebrovascular disease affecting right dominant side: Secondary | ICD-10-CM

## 2021-06-02 DIAGNOSIS — R4701 Aphasia: Secondary | ICD-10-CM

## 2021-06-02 DIAGNOSIS — R1310 Dysphagia, unspecified: Secondary | ICD-10-CM

## 2021-06-02 DIAGNOSIS — R482 Apraxia: Secondary | ICD-10-CM

## 2021-06-02 DIAGNOSIS — M6281 Muscle weakness (generalized): Secondary | ICD-10-CM

## 2021-06-02 DIAGNOSIS — R278 Other lack of coordination: Secondary | ICD-10-CM

## 2021-06-02 DIAGNOSIS — R471 Dysarthria and anarthria: Secondary | ICD-10-CM

## 2021-06-02 NOTE — Therapy (Signed)
[facility] [address] [place], [place], [address] Phone: [phone]   Fax:  [phone]  Physical Therapy Treatment  Patient Details  Name: Kara Bailey MRN: [id number] Date of Birth: January 19, 1956 Referring Provider (PT): Cipriano Mile, NP   Encounter Date: 06/02/2021   PT End of Session - 06/02/21 1020     Visit Number 12    Number of Visits 17    Date for PT Re-Evaluation 07/17/21   written for 60 day POC   Authorization Type [employer] - auth from 6/7-07/17/21    PT Start Time 1020    PT Stop Time 1100    PT Time Calculation (min) 40 min    Equipment Utilized During Treatment --    Activity Tolerance Patient tolerated treatment well    Behavior During Therapy [facility] for tasks assessed/performed             Past Medical History:  Diagnosis Date   Anxiety    Chest pain 08/2014   unspecified   Cirrhosis ([place])    Depression    Dizziness 08/2014   Dyspnea 08/2014   Emphysema lung ([place])    GERD (gastroesophageal reflux disease)    Heart murmur    Hepatitis C    Hiatal hernia    Hypertension    PAC (premature atrial contraction) 10/11/2014   PAD (peripheral artery disease) ([facility])    Palpitations    PVC (premature ventricular contraction) 10/11/2014   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   Stroke ([place])    just slurred speech   Tachycardia 08/2014   Ventral hernia    Weakness 08/2014    Past Surgical History:  Procedure Laterality Date   APPENDECTOMY     CATARACT EXTRACTION, BILATERAL     COLONOSCOPY     COLONOSCOPY WITH PROPOFOL N/A 04/30/2016   Procedure: COLONOSCOPY WITH PROPOFOL;  Surgeon: Daneil Dolin, MD;  [facility];  Service: Endoscopy;  Laterality: N/A;  1230   ESOPHAGOGASTRODUODENOSCOPY     approximately 2010   ESOPHAGOGASTRODUODENOSCOPY (EGD) WITH PROPOFOL N/A 04/30/2016   Procedure: ESOPHAGOGASTRODUODENOSCOPY (EGD) WITH PROPOFOL;  Surgeon: Daneil Dolin, MD;  [facility];  Service: Endoscopy;  Laterality: N/A;   LEFT HEART CATHETERIZATION WITH CORONARY ANGIOGRAM N/A 10/29/2014   07-14-20- pt denies this Procedure: LEFT HEART CATHETERIZATION WITH CORONARY ANGIOGRAM;  Surgeon: Burnell Blanks, MD;  Location: [facility] CATH LAB;  Service: Cardiovascular;  Laterality: N/A;   MALONEY DILATION N/A 04/30/2016   Procedure: Venia Minks DILATION;  Surgeon: Daneil Dolin, MD;  [facility];  Service: Endoscopy;  Laterality: N/A;   POLYPECTOMY  04/30/2016   Procedure: POLYPECTOMY;  Surgeon: Daneil Dolin, MD;  [facility];  Service: Endoscopy;;  Sigmoid colon polyp removed via hot snare   UPPER GASTROINTESTINAL ENDOSCOPY      There were no vitals filed for this visit.   Subjective Assessment - 06/02/21 1021     Subjective Nothing new.  No falls.  Just don't always trust this left leg.    Pertinent History smoking, PAD, HTN, ETOH abuse, chronic hepatitis C with hepatic coma,GAD, esophageal stricture    Limitations Walking;Standing    Patient Stated Goals wants to work on walking normally    Currently in Pain? No/denies                               [facility] Adult  PT Treatment/Exercise - 06/02/21 1020       Transfers   Transfers Sit to Stand;Stand to Sit    Sit to Stand 5: Supervision;With upper extremity assist    Stand to Sit 5: Supervision;With upper extremity assist    Comments stride stance sit to stand, LLE posterior, min assistance from chair, as LLE tremors with fatigue.  Cues to push through LLE and to activate gluts/quads upon standing.  Additional at least 5 reps of sit<>stand throughout session from chair, as pt has brief seated restbreaks.      Ambulation/Gait   Ambulation/Gait Yes    Ambulation/Gait Assistance 5: Supervision;4: Min guard    Ambulation Distance (Feet) 60 Feet   x 4; 115 ft   Assistive device None    Gait Pattern Decreased arm swing - right;Step-through  pattern;Decreased stance time - right;Decreased hip/knee flexion - right;Decreased dorsiflexion - right;Poor foot clearance - right;Lateral trunk lean to right;Poor foot clearance - left    Ambulation Surface Level;Indoor      Knee/Hip Exercises: Clinical research associate 2 reps;Both;30 seconds    Gastroc Stretch Limitations standing at [employer] - reviewed addition to HEP last visit; pt needs min cues for technique      Knee/Hip Exercises: Aerobic   Stepper Seated SciFit Stepper with BLE/BUE at gear 1.6 for strengthening/ROM/activity tolerance for 6 minutes - one minute with BLEs only      Knee/Hip Exercises: Standing   Functional Squat 2 sets;10 reps;Limitations    Functional Squat Limitations 2nd set squat> up on toes; tactile cues for proper squat technique                 Balance Exercises - 06/02/21 0001       Balance Exercises: Standing   Tandem Stance Eyes open;Upper extremity support 2;3 reps;10 secs    Tandem Gait Forward;Retro;Upper extremity support;2 reps;Limitations    Tandem Gait Limitations Cues for foot placement and for posture    Marching Solid surface;Upper extremity assist 1;Dynamic;Forwards;Retro;Limitations    Marching Limitations 3 reps, cues for increased LLE step height                 PT Short Term Goals - 05/12/21 1012       PT SHORT TERM GOAL #1   Title Pt will be independent with initial HEP in order to build upon functional gains made in therapy. ALL STGS DUE 05/16/21    Baseline needing reminder cues for technique    Time 4    Period Weeks    Status Partially Met    Target Date 05/16/21      PT SHORT TERM GOAL #2   Title Pt will finish BERG assessment to determine fall risk with LTG written    Baseline 43/56 - LTG written    Time 4    Period Weeks    Status Achieved      PT SHORT TERM GOAL #3   Title Pt will decr TUG time to 17 seconds or less with no AD in order to demo decr fall risk.    Baseline 20.47 seconds; 16.18 ON  05/09/21    Time 4    Period Weeks    Status Achieved      PT SHORT TERM GOAL #4   Title Pt will improve gait speed to at least 2.2 ft/sec with no AD vs. LRAD in order to demo decr fall risk.    Baseline 1.97 ft/sec; 1.83 ft/sec with no AD  Time 4    Period Weeks    Status Not Met      PT SHORT TERM GOAL #5   Title Pt will decr 5x sit <> stand time to 50 seconds or less in order to demo improved BLE strength and balance.    Baseline 64.32 seconds no UE support; 31.47 seconds on 05/09/21    Time 4    Period Weeks    Status Achieved               PT Long Term Goals - 05/09/21 1023       PT LONG TERM GOAL #1   Title Pt will improve BERG score to at least a 48/56 in order to demo decr fall risk. ALL LTGS DUE 06/13/21    Baseline 43/56    Time 8    Period Weeks    Status Revised      PT LONG TERM GOAL #2   Title Pt will go ascend and descend 4 steps with single handrail vs. use of LRAD with supervision in order to be able to go to her son's house.    Baseline can not perform stairs    Time 8    Period Weeks    Status New      PT LONG TERM GOAL #3   Title Pt will decr 5x sit <> stand time to 25 seconds or less in order to demo improved BLE strength and balance.    Baseline 64.32 seconds no UE support; 31.47 seconds on 05/09/21 with no UE support    Time 8    Period Weeks    Status Revised      PT LONG TERM GOAL #4   Title Pt will ambulate at least 300' outdoors over unlevel paved surfaces with LRAD in order to demo improved community mobility.    Baseline not yet assessed.    Time 8    Period Weeks    Status New      PT LONG TERM GOAL #5   Title Pt will decr TUG time to 13.5 seconds or less with no AD in order to demo decr fall risk.    Baseline 20.47 seconds; 16.18 ON 05/09/21    Time 8    Period Weeks    Status Revised      PT LONG TERM GOAL #6   Title Pt will improve FOTO score to at least 60% in order to demo improved functional outcomes.    Baseline 50%     Time 8    Period Weeks    Status New                   Plan - 06/02/21 1308     Clinical Impression Statement Reviewed addition of gastroc stretch to HEP last visit, with pt demo understanding with minimal cues for technique.  Pt's LLE continues to fatigue with exercise and she has noticeable weightshift to RLE with squat/heel-toe raise activiteis, needing verbal and tactile cues for equal weightbearing.  She will continue to benefit from skilled PT to further address balance, strength, gait training towards LTGs for improved overall functional mobility.    Personal Factors and Comorbidities Comorbidity 3+;Past/Current Experience    Comorbidities smoking, PAD, HTN, ETOH abuse, chronic hepatitis C with hepatic coma,GAD, esophageal stricture    Examination-Activity Limitations Locomotion Level;Transfers;Stairs;Squat;Carry    Examination-Participation Restrictions Cleaning;Community Activity;Laundry    Stability/Clinical Decision Making Stable/Uncomplicated    Rehab Potential Good    PT  Frequency 2x / week    PT Duration 8 weeks    PT Treatment/Interventions ADLs/Self Care Home Management;DME Instruction;Gait training;Stair training;Therapeutic activities;Functional mobility training;Therapeutic exercise;Balance training;Neuromuscular re-education;Orthotic Fit/Training;Patient/family education;Vestibular    PT Next Visit Plan continue to work with foot up brace with RLE vs. LLE - pt to order in august due to financials.continue to work on BLE strengthening, standing balance (work EC, narrow BOS, SLS tasks, head motions), sit <> stand training. stair training.    PT Home Exercise Plan [facility]    Consulted and Agree with Plan of Care Patient             Patient will benefit from skilled therapeutic intervention in order to improve the following deficits and impairments:  Abnormal gait, Decreased activity tolerance, Decreased balance, Decreased coordination, Decreased endurance,  Decreased knowledge of use of DME, Difficulty walking, Decreased strength, Dizziness, Impaired sensation, Postural dysfunction  Visit Diagnosis: Muscle weakness (generalized)  Unsteadiness on feet     Problem List Patient Active Problem List   Diagnosis Date Noted   Acute CVA (cerebrovascular accident) ([place]) 12/29/2020   Peripheral arterial disease ([place]) 12/20/2020   History of esophageal stricture 07/08/2020   Insomnia 12/02/2017   Chronic cough 08/14/2017   Poor appetite 08/29/2016   Peptic stricture of esophagus    History of colonic polyps    Hiatal hernia 03/16/2016   Odynophagia 03/16/2016   Dysphagia 03/16/2016   Alcohol abuse 10/26/2015   Essential hypertension 09/26/2015   PAC (premature atrial contraction) 10/11/2014   PVC (premature ventricular contraction) 10/11/2014   Back muscle spasm 06/15/2014   Generalized anxiety disorder 06/15/2014   Smoking 06/15/2014   Chronic hepatitis C without hepatic coma ([place]) 05/28/2013    Amulya Quintin W. 06/02/2021, 1:11 PM Frazier Butt., PT  [facility] [address] [place], [place], [address] Phone: [phone]   Fax:  [phone]  Name: ARLEY GARANT MRN: [id number] Date of Birth: 1955/12/27

## 2021-06-02 NOTE — Therapy (Signed)
[facility] [address] [place], [place], [address] Phone: [phone]   Fax:  [phone]  Speech Language Pathology Treatment  Patient Details  Name: Kara Bailey MRN: [id number] Date of Birth: 10/13/1956 Referring Provider (SLP): [place], Shanna, NP   Encounter Date: 06/02/2021   End of Session - 06/02/21 1314     Visit Number 13    Number of Visits 17    Date for SLP Re-Evaluation 06/09/21    Authorization Type [employer]    Authorization Time Period 04-14-21 to 06-09-21 (9 visits); (05-19-21 to 06-09-21) (9 more visits) 18 total visits    Authorization - Visit Number 13    Authorization - Number of Visits 18   per [name] on 05-16-21   SLP Start Time 1104    SLP Stop Time  1145    SLP Time Calculation (min) 41 min    Activity Tolerance Patient tolerated treatment well             Past Medical History:  Diagnosis Date   Anxiety    Chest pain 08/2014   unspecified   Cirrhosis ([facility])    Depression    Dizziness 08/2014   Dyspnea 08/2014   Emphysema lung ([facility])    GERD (gastroesophageal reflux disease)    Heart murmur    Hepatitis C    Hiatal hernia    Hypertension    PAC (premature atrial contraction) 10/11/2014   PAD (peripheral artery disease) (HCC)    Palpitations    PVC (premature ventricular contraction) 10/11/2014   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   Stroke ([facility])    just slurred speech   Tachycardia 08/2014   Ventral hernia    Weakness 08/2014    Past Surgical History:  Procedure Laterality Date   APPENDECTOMY     CATARACT EXTRACTION, BILATERAL     COLONOSCOPY     COLONOSCOPY WITH PROPOFOL N/A 04/30/2016   Procedure: COLONOSCOPY WITH PROPOFOL;  Surgeon: Robert M Rourk, MD;  [facility];  Service: Endoscopy;  Laterality: N/A;  1230   ESOPHAGOGASTRODUODENOSCOPY     approximately 2010   ESOPHAGOGASTRODUODENOSCOPY (EGD) WITH PROPOFOL N/A 04/30/2016    Procedure: ESOPHAGOGASTRODUODENOSCOPY (EGD) WITH PROPOFOL;  Surgeon: Robert M Rourk, MD;  [facility];  Service: Endoscopy;  Laterality: N/A;   LEFT HEART CATHETERIZATION WITH CORONARY ANGIOGRAM N/A 10/29/2014   07-14-20- pt denies this Procedure: LEFT HEART CATHETERIZATION WITH CORONARY ANGIOGRAM;  Surgeon: Christopher D McAlhany, MD;  [facility]: [facility] CATH [facility];  Service: Cardiovascular;  Laterality: N/A;   MALONEY DILATION N/A 04/30/2016   Procedure: MALONEY DILATION;  Surgeon: Robert M Rourk, MD;  [facility];  Service: Endoscopy;  Laterality: N/A;   POLYPECTOMY  04/30/2016   Procedure: POLYPECTOMY;  Surgeon: Robert M Rourk, MD;  [facility];  Service: Endoscopy;;  Sigmoid colon polyp removed via hot snare   UPPER GASTROINTESTINAL ENDOSCOPY      There were no vitals filed for this visit.   Subjective Assessment - 06/02/21 1304     Subjective Brings blue folder in with her today.    Currently in Pain? No/denies                   ADULT SLP TREATMENT - 06/02/21 1305       General Information   Behavior/Cognition Alert;Cooperative;Pleasant mood      Treatment Provided   Treatment provided Cognitive-Linquistic      Dysphagia Treatment     Other treatment/comments SLP re-explained dysphagia HEP for pt today - pt was directed by SLP how to complete with sips for effortful swallow and with "touches of water" for [place]. Pt unable to complete without water so SLP guided pt to touch her "tongue to some water - enough so your mouth can feel just a little bit wet". Pt completed 10 reps of each in ~12 minutes. [place]a told SLP that turning head to left has made it easier for her to drink "heavy" drinks since last session.      Cognitive-Linquistic Treatment   Treatment focused on Apraxia;Dysarthria    Skilled Treatment SLP guided pt though practice of word and sentence stimuli - she told SLP that she gets frustrated when airflow is impeded due to apraxia. SLP  provided analogy of synchronous nature of muscle motion needed for speech by example of the movement of arm and hand necessary to pick up and look at phone. Pt stated she understood what was happening with the muscles she uses to speak with that visual analogy. Pt practice tasks were completed with less fluency than conversation again today.      Assessment / Recommendations / Plan   Plan Continue with current plan of care   consider focus on conversation as pt more fluent in that setting than in structured speech tasks.     Dysphagia Recommendations   Diet recommendations --   as tolerated   Liquids provided via Cup;Straw    Medication Administration --   as tolerated   Postural Changes and/or Swallow Maneuvers Head turn left during swallow   with liquids     Progression Toward Goals   Progression toward goals Progressing toward goals              SLP Education - 06/02/21 1313     Education Details snoothness of muscular synchronicity not there with pt's speech, take water with HEP for dysphagia    Person(s) Educated Patient    Methods Explanation;Demonstration    Comprehension Verbalized understanding              SLP Short Term Goals - 05/19/21 1303       SLP SHORT TERM GOAL #1   Title Pt will demonstrate dysarthria compensations in 10 minute simple to mod complex conversation with min A over 2 sessions    Status Not Met      SLP SHORT TERM GOAL #2   Title Pt will use memory/attention compensations for appointments, medicine management, and other daily activities with occasional min A over 3 sessions.    Baseline 05-12-21    Status Partially Met      SLP SHORT TERM GOAL #3   Title Pt will ID and correct word finding errors with 80% accuracy given occasional min A over 2 sessions    Baseline 05-12-21    Status Partially Met      SLP SHORT TERM GOAL #4   Title Pt will verbalize 3 safety concerns in the home and generate solutions to them with occasional min A over 2  sessions    Baseline 05-12-21    Status Partially Met      SLP SHORT TERM GOAL #5   Title Pt will complete dysphagia HEP to optimize swallow function with occasional min A over 2 sessions    Status Not Met      SLP SHORT TERM GOAL #6   Title Pt will exhibit improved oral coordination on targeted apraxia tasks with occasional min A  over 2 sessions    Status Not Met              SLP Long Term Goals - 06/02/21 1314       SLP LONG TERM GOAL #1   Title Pt will demonstrate dysarthria compensations in 15 minute simple to mod complex conversation with min A over 2 sessions    Time 2    Period Weeks    Status On-going      SLP LONG TERM GOAL #2   Title Pt will use memory/attention compensations for appointments, medicine management, and other daily activities with rare min A over 3 sessions.    Time 2    Period Weeks    Status On-going      SLP LONG TERM GOAL #3   Title Pt will ID and correct word finding errors with 95% accuracy given occasional min A over 2 sessions    Time 2    Period Weeks    Status On-going      SLP LONG TERM GOAL #4   Title Pt will report improved diet tolerance and reduced s/sx of aspiration with rare min over 2 sessions    Time 2    Period Weeks    Status On-going      SLP LONG TERM GOAL #5   Title Pt will undergo repeat objective swallow study as needed if s/sx of aspiration persist    Time 2    Period Weeks    Status On-going              Plan - 06/02/21 1314     Clinical Impression Statement [place] cont to present with changes in speech, cognitive communication, and swallowing s/p CVA in February 2022. Ongoing education and training provided to maximize and coordinate breath support to address verbal apraxia and dysarthria. SLP educated patient on head turn lt today as compensatory posture to limit pharyngeal dysphagia. Skilled ST is cont'd recommended to address speech, cognitive communication, and dysphagia impacting pt's overall safety  and functional independence compared to baseline.    Speech Therapy Frequency 2x / week    Duration 8 weeks    Treatment/Interventions Aspiration precaution training;Oral motor exercises;Compensatory strategies;Pharyngeal strengthening exercises;Functional tasks;Patient/family education;Diet toleration management by SLP;Cognitive reorganization;Multimodal communcation approach;Language facilitation;Compensatory techniques;Internal/external aids;SLP instruction and feedback    Potential to Achieve Goals Fair    Potential Considerations Ability to learn/carryover information;Severity of impairments    SLP Home Exercise Plan provided    Consulted and Agree with Plan of Care Patient             Patient will benefit from skilled therapeutic intervention in order to improve the following deficits and impairments:   Dysphagia, unspecified type  Verbal apraxia  Dysarthria and anarthria  Cognitive communication deficit  Aphasia    Problem List Patient Active Problem List   Diagnosis Date Noted   Acute CVA (cerebrovascular accident) ([place]) 12/29/2020   Peripheral arterial disease ([place]) 12/20/2020   History of esophageal stricture 07/08/2020   Insomnia 12/02/2017   Chronic cough 08/14/2017   Poor appetite 08/29/2016   Peptic stricture of esophagus    History of colonic polyps    Hiatal hernia 03/16/2016   Odynophagia 03/16/2016   Dysphagia 03/16/2016   Alcohol abuse 10/26/2015   Essential hypertension 09/26/2015   PAC (premature atrial contraction) 10/11/2014   PVC (premature ventricular contraction) 10/11/2014   Back muscle spasm 06/15/2014   Generalized anxiety disorder 06/15/2014   Smoking 06/15/2014   Chronic  hepatitis C without hepatic coma ([facility]) 05/28/2013    SCHINKE,CARL ,MS, CCC-SLP  06/02/2021, 1:15 PM  [facility] [address] [place], [place], [address] Phone: [phone]   Fax:   [phone]   Name: Tenika M Glomb MRN: [id number] Date of Birth: 02/20/1956  

## 2021-06-02 NOTE — Therapy (Signed)
[facility] [address] [place], [place], [address] Phone: [phone]   Fax:  [phone]  Occupational Therapy Treatment  Patient Details  Name: Kara Bailey MRN: [id number] Date of Birth: 1956/08/18 Referring Provider (OT): Cipriano Mile, NP   Encounter Date: 06/02/2021   OT End of Session - 06/02/21 1239     Visit Number 10    Number of Visits 17    Date for OT Re-Evaluation 06/16/21   renewal   Authorization Type [employer] Medicare/Medicaid    Authorization Time Period Covered 100%  Auth 8 visits (05/30/21 - 06/27/21)    Authorization - Visit Number 1    Authorization - Number of Visits 8    Progress Note Due on Visit 18    OT Start Time 1238   pt arrival time   OT Stop Time [id number]    OT Time Calculation (min) 45 min    Activity Tolerance Patient tolerated treatment well    Behavior During Therapy [facility] for tasks assessed/performed             Past Medical History:  Diagnosis Date   Anxiety    Chest pain 08/2014   unspecified   Cirrhosis ([place])    Depression    Dizziness 08/2014   Dyspnea 08/2014   Emphysema lung (HCC)    GERD (gastroesophageal reflux disease)    Heart murmur    Hepatitis C    Hiatal hernia    Hypertension    PAC (premature atrial contraction) 10/11/2014   PAD (peripheral artery disease) (HCC)    Palpitations    PVC (premature ventricular contraction) 10/11/2014   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   Stroke ([place])    just slurred speech   Tachycardia 08/2014   Ventral hernia    Weakness 08/2014    Past Surgical History:  Procedure Laterality Date   APPENDECTOMY     CATARACT EXTRACTION, BILATERAL     COLONOSCOPY     COLONOSCOPY WITH PROPOFOL N/A 04/30/2016   Procedure: COLONOSCOPY WITH PROPOFOL;  Surgeon: Daneil Dolin, MD;  [facility];  Service: Endoscopy;  Laterality: N/A;  1230   ESOPHAGOGASTRODUODENOSCOPY     approximately 2010    ESOPHAGOGASTRODUODENOSCOPY (EGD) WITH PROPOFOL N/A 04/30/2016   Procedure: ESOPHAGOGASTRODUODENOSCOPY (EGD) WITH PROPOFOL;  Surgeon: Daneil Dolin, MD;  [facility];  Service: Endoscopy;  Laterality: N/A;   LEFT HEART CATHETERIZATION WITH CORONARY ANGIOGRAM N/A 10/29/2014   07-14-20- pt denies this Procedure: LEFT HEART CATHETERIZATION WITH CORONARY ANGIOGRAM;  Surgeon: Burnell Blanks, MD;  Location: [facility] CATH LAB;  Service: Cardiovascular;  Laterality: N/A;   MALONEY DILATION N/A 04/30/2016   Procedure: Venia Minks DILATION;  Surgeon: Daneil Dolin, MD;  [facility];  Service: Endoscopy;  Laterality: N/A;   POLYPECTOMY  04/30/2016   Procedure: POLYPECTOMY;  Surgeon: Daneil Dolin, MD;  [facility];  Service: Endoscopy;;  Sigmoid colon polyp removed via hot snare   UPPER GASTROINTESTINAL ENDOSCOPY      There were no vitals filed for this visit.   Subjective Assessment - 06/02/21 1241     Subjective  Pt denies any pain. Reports "nothing much" has changed.    Pertinent History PMH smoking, PAD, HTN, ETOH abuse, chronic hepatitis C with hepatic coma,GAD, esophageal stricture,  recent episode of possible hematemesis    Limitations Fall Risk    Patient Stated Goals "all of that is ok"    Currently in Pain?  No/denies                          OT Treatments/Exercises (OP) - 06/02/21 1242       Exercises   Exercises Hand      Cognitive Exercises   Attention Span Alternating Constant Therapy Alternating Symbols level 7 with 90% accuracy and 125.56s response time. Increased time for completing task.   increased time - spent a good amount of time checking over work to make sure got all symbols.     Hand Exercises   Hand Gripper with Medium Beads 1 inch blocks with LUE level 2 with black spring with max drops and difficulty. Pt continued and finished without downgrading to lower level this day. Pt completed same with RUE with little to no  difficulty with RUE                         OT Long Term Goals - 05/12/21 1105       OT LONG TERM GOAL #1   Title Pt will be independent with HEP for coordination and grip strength    Time 4    Period Weeks    Status Achieved      OT LONG TERM GOAL #2   Title Pt will improve grip strength, bilaterally, to a functional strength (35lbs or greater).    Baseline R 32.6, L 26.6    Time 4    Period Weeks    Status On-going   L 29, R 29.9     OT LONG TERM GOAL #3   Title Pt will perform physical and cognitive task simultaneously with 90% accuracy    Time 4    Period Weeks    Status On-going      OT LONG TERM GOAL #4   Title Pt will verbalize understanding of adapted strategies and/or equipment PRN to increase safety and independence with ADLs and IADLs (cutting vegetables, etc)    Time 4    Period Weeks    Status Achieved   pt reports not having difficulty with cutting vegetables anymore at home 04/19/21     OT LONG TERM GOAL #5   Title Pt will report decreased drops at home d/t increased coordination and attention to right side.    Time 5    Period Weeks    Status New    Target Date 06/16/21                   Plan - 06/02/21 1258     Clinical Impression Statement Pt is progressing towards goals. Pt continues to benefit from OT to target coordination, strengthening BUE grip and attention.    OT Occupational Profile and History Problem Focused Assessment - Including review of records relating to presenting problem    Occupational performance deficits (Please refer to evaluation for details): ADL's;IADL's    Body Structure / Function / Physical Skills ADL;FMC;Coordination;Decreased knowledge of use of DME;Dexterity;GMC;Strength;UE functional use;Vision;IADL    Cognitive Skills Attention;Sequencing;Safety Awareness;Problem Solve    Rehab Potential Good    Clinical Decision Making Limited treatment options, no task modification necessary    Comorbidities  Affecting Occupational Performance: None    Modification or Assistance to Complete Evaluation  No modification of tasks or assist necessary to complete eval    OT Frequency 2x / week    OT Duration Other (comment)   2x/week for 4 more weeks at renewal -  5 weeks to allow for missed visits   OT Treatment/Interventions Self-care/ADL training;Neuromuscular education;Energy conservation;Functional Mobility Training;Patient/family education;Cognitive remediation/compensation;Therapeutic exercise;Therapeutic activities;DME and/or AE instruction;Moist Heat;Fluidtherapy;Visual/perceptual remediation/compensation    Plan renewal completed 7/1. coordination (gross and fine), Grip Strength BUE, alternating attention/dual tasking, start checking goals    Recommended Other Services get scheduled for PT (referral in Epic)    Consulted and Agree with Plan of Care Patient             Patient will benefit from skilled therapeutic intervention in order to improve the following deficits and impairments:   Body Structure / Function / Physical Skills: ADL, [place], Coordination, Decreased knowledge of use of DME, Dexterity, GMC, Strength, UE functional use, Vision, IADL Cognitive Skills: Attention, Sequencing, Safety Awareness, Problem Solve     Visit Diagnosis: Unsteadiness on feet  Muscle weakness (generalized)  Other symptoms and signs involving the nervous system  Hemiplegia and hemiparesis following other cerebrovascular disease affecting right dominant side ([place])  Other lack of coordination  Attention and concentration deficit    Problem List Patient Active Problem List   Diagnosis Date Noted   Acute CVA (cerebrovascular accident) ([place]) 12/29/2020   Peripheral arterial disease ([place]) 12/20/2020   History of esophageal stricture 07/08/2020   Insomnia 12/02/2017   Chronic cough 08/14/2017   Poor appetite 08/29/2016   Peptic stricture of esophagus    History of colonic polyps    Hiatal hernia  03/16/2016   Odynophagia 03/16/2016   Dysphagia 03/16/2016   Alcohol abuse 10/26/2015   Essential hypertension 09/26/2015   PAC (premature atrial contraction) 10/11/2014   PVC (premature ventricular contraction) 10/11/2014   Back muscle spasm 06/15/2014   Generalized anxiety disorder 06/15/2014   Smoking 06/15/2014   Chronic hepatitis C without hepatic coma ([place]) 05/28/2013    Zachery Conch MOT, OTR/L  06/02/2021, 1:24 PM  [facility] [address] [place], [place], [address] Phone: [phone]   Fax:  [phone]  Name: Kara Bailey MRN: [id number] Date of Birth: 1956/06/08

## 2021-06-05 ENCOUNTER — Encounter: Payer: Self-pay | Admitting: Occupational Therapy

## 2021-06-05 ENCOUNTER — Other Ambulatory Visit: Payer: Self-pay

## 2021-06-05 ENCOUNTER — Ambulatory Visit: Payer: Medicare HMO | Admitting: Occupational Therapy

## 2021-06-05 DIAGNOSIS — M6281 Muscle weakness (generalized): Secondary | ICD-10-CM

## 2021-06-05 DIAGNOSIS — I69851 Hemiplegia and hemiparesis following other cerebrovascular disease affecting right dominant side: Secondary | ICD-10-CM

## 2021-06-05 DIAGNOSIS — R29818 Other symptoms and signs involving the nervous system: Secondary | ICD-10-CM

## 2021-06-05 DIAGNOSIS — R278 Other lack of coordination: Secondary | ICD-10-CM

## 2021-06-05 DIAGNOSIS — R2681 Unsteadiness on feet: Secondary | ICD-10-CM

## 2021-06-05 DIAGNOSIS — R41840 Attention and concentration deficit: Secondary | ICD-10-CM

## 2021-06-05 NOTE — Therapy (Signed)
[facility] [address] [place], [place], [address] Phone: [phone]   Fax:  [phone]  Occupational Therapy Treatment  Patient Details  Name: Kara Bailey MRN: [id number] Date of Birth: 09/10/1956 Referring Provider (OT): Cipriano Mile, NP   Encounter Date: 06/05/2021   OT End of Session - 06/05/21 1019     Visit Number 11    Number of Visits 17    Date for OT Re-Evaluation 06/16/21   renewal   Authorization Type [employer] Medicare/Medicaid    Authorization Time Period Covered 100%  Auth 8 visits (05/30/21 - 06/27/21)    Authorization - Visit Number 2    Authorization - Number of Visits 8    Progress Note Due on Visit 18    OT Start Time 1018    OT Stop Time 1100    OT Time Calculation (min) 42 min    Activity Tolerance Patient tolerated treatment well    Behavior During Therapy [facility] for tasks assessed/performed             Past Medical History:  Diagnosis Date   Anxiety    Chest pain 08/2014   unspecified   Cirrhosis ([place])    Depression    Dizziness 08/2014   Dyspnea 08/2014   Emphysema lung (HCC)    GERD (gastroesophageal reflux disease)    Heart murmur    Hepatitis C    Hiatal hernia    Hypertension    PAC (premature atrial contraction) 10/11/2014   PAD (peripheral artery disease) (HCC)    Palpitations    PVC (premature ventricular contraction) 10/11/2014   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   Stroke ([place])    just slurred speech   Tachycardia 08/2014   Ventral hernia    Weakness 08/2014    Past Surgical History:  Procedure Laterality Date   APPENDECTOMY     CATARACT EXTRACTION, BILATERAL     COLONOSCOPY     COLONOSCOPY WITH PROPOFOL N/A 04/30/2016   Procedure: COLONOSCOPY WITH PROPOFOL;  Surgeon: Daneil Dolin, MD;  [facility];  Service: Endoscopy;  Laterality: N/A;  1230   ESOPHAGOGASTRODUODENOSCOPY     approximately 2010   ESOPHAGOGASTRODUODENOSCOPY  (EGD) WITH PROPOFOL N/A 04/30/2016   Procedure: ESOPHAGOGASTRODUODENOSCOPY (EGD) WITH PROPOFOL;  Surgeon: Daneil Dolin, MD;  [facility];  Service: Endoscopy;  Laterality: N/A;   LEFT HEART CATHETERIZATION WITH CORONARY ANGIOGRAM N/A 10/29/2014   07-14-20- pt denies this Procedure: LEFT HEART CATHETERIZATION WITH CORONARY ANGIOGRAM;  Surgeon: Burnell Blanks, MD;  Location: [facility] CATH LAB;  Service: Cardiovascular;  Laterality: N/A;   MALONEY DILATION N/A 04/30/2016   Procedure: Venia Minks DILATION;  Surgeon: Daneil Dolin, MD;  [facility];  Service: Endoscopy;  Laterality: N/A;   POLYPECTOMY  04/30/2016   Procedure: POLYPECTOMY;  Surgeon: Daneil Dolin, MD;  [facility];  Service: Endoscopy;;  Sigmoid colon polyp removed via hot snare   UPPER GASTROINTESTINAL ENDOSCOPY      There were no vitals filed for this visit.   Subjective Assessment - 06/05/21 1019     Subjective  Pt denies any pain. Reports nothing new.    Pertinent History PMH smoking, PAD, HTN, ETOH abuse, chronic hepatitis C with hepatic coma,GAD, esophageal stricture,  recent episode of possible hematemesis    Limitations Fall Risk    Patient Stated Goals "all of that is ok"    Currently in Pain? No/denies  [facility] OT Assessment - 06/05/21 1020       Hand Function   Right Hand Grip (lbs) 48    Left Hand Grip (lbs) 34.1                  Small Pegs with LUE to target fine motor coordination and copying pattern for visual perceptual skills and visual motor integration. Pt removed pegs with in hand manipulation. Pt completed with min difficulty and min drops.    Physical/Cognitive Dual Task while ambulating, tossing ball and word finding/categorization with foods ABC with max difficulty. Pt had max difficulty with continuing task and tossing ball while ambulating with cognitive component.  Pt with much difficulty with more than one task at a time (I.e.  ambulating with tossing ball, ambulating and word finding). Pt with slow walking speed this day while doing multiple tasks.   Functional Mobility and ambulating with plate of rolling objects in LUE and a cup of liquid in RUE. Pt completed activity with no spills and/or drops. Pt ambulated with very slow and controlled speed and attending to objects only with mod cues for attending to stimuli and obstacles around them.   Driving discussed correlation between activities and driving. Pt reports she never had a doctor tell her she shouldn't be driving but her daughter in law says that she should not be. OT educated on difficulty with attention, especially to multiple tasks, and how that correlates to driving. Pt verbalized understanding. Pt is currently driving and drove herself to therapy. Pt educated on graduated driving program and maybe getting a licensed driver to ride with her to assess how she is doing with all external stimuli and multiple aspects of driving.            OT Long Term Goals - 06/05/21 1021       OT LONG TERM GOAL #1   Title Pt will be independent with HEP for coordination and grip strength    Time 4    Period Weeks    Status Achieved      OT LONG TERM GOAL #2   Title Pt will improve grip strength, bilaterally, to a functional strength (35lbs or greater).    Baseline R 32.6, L 26.6    Time 4    Period Weeks    Status Partially Met   L 34.1, R 48 - met with Right almost met with left.     OT LONG TERM GOAL #3   Title Pt will perform physical and cognitive task simultaneously with 90% accuracy    Time 4    Period Weeks    Status On-going      OT LONG TERM GOAL #4   Title Pt will verbalize understanding of adapted strategies and/or equipment PRN to increase safety and independence with ADLs and IADLs (cutting vegetables, etc)    Time 4    Period Weeks    Status Achieved   pt reports not having difficulty with cutting vegetables anymore at home 04/19/21     OT  LONG TERM GOAL #5   Title Pt will report decreased drops at home d/t increased coordination and attention to right side.    Time 5    Period Weeks    Status New                   Plan - 06/05/21 1101     Clinical Impression Statement Pt has met grip strength goal for RUE and is progressing  towards LUE. PT with significant difficulty with dual tasks and alternating attention. Education provided on correlation to driving and concern for driving with how much difficulty she had with alternating attention.    OT Occupational Profile and History Problem Focused Assessment - Including review of records relating to presenting problem    Occupational performance deficits (Please refer to evaluation for details): ADL's;IADL's    Body Structure / Function / Physical Skills ADL;FMC;Coordination;Decreased knowledge of use of DME;Dexterity;GMC;Strength;UE functional use;Vision;IADL    Cognitive Skills Attention;Sequencing;Safety Awareness;Problem Solve    Rehab Potential Good    Clinical Decision Making Limited treatment options, no task modification necessary    Comorbidities Affecting Occupational Performance: None    Modification or Assistance to Complete Evaluation  No modification of tasks or assist necessary to complete eval    OT Frequency 2x / week    OT Duration Other (comment)   2x/week for 4 more weeks at renewal - 5 weeks to allow for missed visits   OT Treatment/Interventions Self-care/ADL training;Neuromuscular education;Energy conservation;Functional Mobility Training;Patient/family education;Cognitive remediation/compensation;Therapeutic exercise;Therapeutic activities;DME and/or AE instruction;Moist Heat;Fluidtherapy;Visual/perceptual remediation/compensation    Plan renewal completed 7/1. coordination (gross and fine), Grip Strength BUE, alternating attention/dual tasking, start checking goals    Recommended Other Services get scheduled for PT (referral in Epic)    Consulted and  Agree with Plan of Care Patient             Patient will benefit from skilled therapeutic intervention in order to improve the following deficits and impairments:   Body Structure / Function / Physical Skills: ADL, [place], Coordination, Decreased knowledge of use of DME, Dexterity, GMC, Strength, UE functional use, Vision, IADL Cognitive Skills: Attention, Sequencing, Safety Awareness, Problem Solve     Visit Diagnosis: Unsteadiness on feet  Other symptoms and signs involving the nervous system  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)  Muscle weakness (generalized)  Other lack of coordination  Attention and concentration deficit    Problem List Patient Active Problem List   Diagnosis Date Noted   Acute CVA (cerebrovascular accident) ([place]) 12/29/2020   Peripheral arterial disease ([place]) 12/20/2020   History of esophageal stricture 07/08/2020   Insomnia 12/02/2017   Chronic cough 08/14/2017   Poor appetite 08/29/2016   Peptic stricture of esophagus    History of colonic polyps    Hiatal hernia 03/16/2016   Odynophagia 03/16/2016   Dysphagia 03/16/2016   Alcohol abuse 10/26/2015   Essential hypertension 09/26/2015   PAC (premature atrial contraction) 10/11/2014   PVC (premature ventricular contraction) 10/11/2014   Back muscle spasm 06/15/2014   Generalized anxiety disorder 06/15/2014   Smoking 06/15/2014   Chronic hepatitis C without hepatic coma ([place]) 05/28/2013    Zachery Conch MOT, OTR/L  06/05/2021, 11:31 AM  [facility] [address] [place], [place], [address] Phone: [phone]   Fax:  [phone]  Name: JAYLEA PLOURDE MRN: [id number] Date of Birth: 1956-09-08

## 2021-06-06 ENCOUNTER — Ambulatory Visit: Payer: Medicare HMO

## 2021-06-06 ENCOUNTER — Encounter: Payer: Self-pay | Admitting: Physical Therapy

## 2021-06-06 ENCOUNTER — Ambulatory Visit: Payer: Medicare HMO | Admitting: Physical Therapy

## 2021-06-06 ENCOUNTER — Ambulatory Visit: Payer: Medicare HMO | Admitting: Occupational Therapy

## 2021-06-06 DIAGNOSIS — R2681 Unsteadiness on feet: Secondary | ICD-10-CM | POA: Diagnosis not present

## 2021-06-06 DIAGNOSIS — R29818 Other symptoms and signs involving the nervous system: Secondary | ICD-10-CM

## 2021-06-06 DIAGNOSIS — M6281 Muscle weakness (generalized): Secondary | ICD-10-CM

## 2021-06-06 DIAGNOSIS — R471 Dysarthria and anarthria: Secondary | ICD-10-CM

## 2021-06-06 DIAGNOSIS — R482 Apraxia: Secondary | ICD-10-CM

## 2021-06-06 DIAGNOSIS — R41841 Cognitive communication deficit: Secondary | ICD-10-CM

## 2021-06-06 DIAGNOSIS — I69851 Hemiplegia and hemiparesis following other cerebrovascular disease affecting right dominant side: Secondary | ICD-10-CM

## 2021-06-06 NOTE — Therapy (Signed)
[facility] [address] [place], [place], [address] Phone: [phone]   Fax:  [phone]  Speech Language Pathology Treatment  Patient Details  Name: Kara Bailey MRN: [id number] Date of Birth: 12-10-1955 Referring Provider (SLP): Cipriano Mile, NP   Encounter Date: 06/06/2021   End of Session - 06/06/21 1102     Visit Number 14    Number of Visits 17    Date for SLP Re-Evaluation 06/09/21    Authorization Type [employer]    Authorization Time Period 04-14-21 to 06-09-21 (9 visits); (05-19-21 to 06-09-21) (9 more visits) 18 total visits    Authorization - Visit Number 108    Authorization - Number of Visits 18    SLP Start Time 1100    SLP Stop Time  1145    SLP Time Calculation (min) 45 min    Activity Tolerance Patient tolerated treatment well             Past Medical History:  Diagnosis Date   Anxiety    Chest pain 08/2014   unspecified   Cirrhosis ([place])    Depression    Dizziness 08/2014   Dyspnea 08/2014   Emphysema lung (HCC)    GERD (gastroesophageal reflux disease)    Heart murmur    Hepatitis C    Hiatal hernia    Hypertension    PAC (premature atrial contraction) 10/11/2014   PAD (peripheral artery disease) (HCC)    Palpitations    PVC (premature ventricular contraction) 10/11/2014   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   Stroke ([place])    just slurred speech   Tachycardia 08/2014   Ventral hernia    Weakness 08/2014    Past Surgical History:  Procedure Laterality Date   APPENDECTOMY     CATARACT EXTRACTION, BILATERAL     COLONOSCOPY     COLONOSCOPY WITH PROPOFOL N/A 04/30/2016   Procedure: COLONOSCOPY WITH PROPOFOL;  Surgeon: Daneil Dolin, MD;  [facility];  Service: Endoscopy;  Laterality: N/A;  1230   ESOPHAGOGASTRODUODENOSCOPY     approximately 2010   ESOPHAGOGASTRODUODENOSCOPY (EGD) WITH PROPOFOL N/A 04/30/2016   Procedure:  ESOPHAGOGASTRODUODENOSCOPY (EGD) WITH PROPOFOL;  Surgeon: Daneil Dolin, MD;  [facility];  Service: Endoscopy;  Laterality: N/A;   LEFT HEART CATHETERIZATION WITH CORONARY ANGIOGRAM N/A 10/29/2014   07-14-20- pt denies this Procedure: LEFT HEART CATHETERIZATION WITH CORONARY ANGIOGRAM;  Surgeon: Burnell Blanks, MD;  Location: [facility] CATH LAB;  Service: Cardiovascular;  Laterality: N/A;   MALONEY DILATION N/A 04/30/2016   Procedure: Venia Minks DILATION;  Surgeon: Daneil Dolin, MD;  [facility];  Service: Endoscopy;  Laterality: N/A;   POLYPECTOMY  04/30/2016   Procedure: POLYPECTOMY;  Surgeon: Daneil Dolin, MD;  [facility];  Service: Endoscopy;;  Sigmoid colon polyp removed via hot snare   UPPER GASTROINTESTINAL ENDOSCOPY      There were no vitals filed for this visit.   Subjective Assessment - 06/06/21 1825     Subjective "My speech is about the same"    Currently in Pain? No/denies                   ADULT SLP TREATMENT - 06/06/21 1122       General Information   Behavior/Cognition Alert;Cooperative;Pleasant mood      Treatment Provided   Treatment provided Cognitive-Linquistic      Cognitive-Linquistic Treatment   Treatment focused on Apraxia;Dysarthria  Skilled Treatment Pt exhibited frustration related to minimal improvements in speech. Pt desires speech to be 80% back to normal, in which pt rates current voice as 60% back to normal at current. Improved use of abdominal breathing exhibited at rest; however, respiratory coordination while speaking continues to impact voice production. 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 SLP targeted use of relaxation techniques and SOVT exercises to reduce laryngeal tension, which was deemed ~60% effective on structured speech tasks (verbalizing biographical information and naming items in grocery store). Improved fluidity of speech noted when not  directly targeted. SLP targeted naming tasks, in which pt named grocery item in alphabetical order. Occasional min A required to recall order with anomia x1 exhibited. Pt became tearful as pt stated "I couldn't think of something that started with D and I was just at the grocery store." Pt continues to complete swallow exercises and perform head turn while drinking, which has been mostly effective per patient report.      Assessment / Recommendations / Plan   Plan Continue with current plan of care      Progression Toward Goals   Progression toward goals Progressing toward goals              SLP Education - 06/06/21 1828     Education Details speech HEP, abdominal breathing    Person(s) Educated Patient    Methods Explanation;Demonstration    Comprehension Verbalized understanding;Returned demonstration;Need further instruction              SLP Short Term Goals - 05/19/21 1303       SLP SHORT TERM GOAL #1   Title Pt will demonstrate dysarthria compensations in 10 minute simple to mod complex conversation with min A over 2 sessions    Status Not Met      SLP SHORT TERM GOAL #2   Title Pt will use memory/attention compensations for appointments, medicine management, and other daily activities with occasional min A over 3 sessions.    Baseline 05-12-21    Status Partially Met      SLP SHORT TERM GOAL #3   Title Pt will ID and correct word finding errors with 80% accuracy given occasional min A over 2 sessions    Baseline 05-12-21    Status Partially Met      SLP SHORT TERM GOAL #4   Title Pt will verbalize 3 safety concerns in the home and generate solutions to them with occasional min A over 2 sessions    Baseline 05-12-21    Status Partially Met      SLP SHORT TERM GOAL #5   Title Pt will complete dysphagia HEP to optimize swallow function with occasional min A over 2 sessions    Status Not Met      SLP SHORT TERM GOAL #6   Title Pt will exhibit improved oral  coordination on targeted apraxia tasks with occasional min A over 2 sessions    Status Not Met              SLP Long Term Goals - 06/06/21 1830       SLP LONG TERM GOAL #1   Title Pt will demonstrate dysarthria compensations in 15 minute simple to mod complex conversation with min A over 2 sessions    Time 1    Period Weeks    Status On-going      SLP LONG TERM GOAL #2   Title Pt will  use memory/attention compensations for appointments, medicine management, and other daily activities with rare min A over 3 sessions.    Time 1    Period Weeks    Status On-going      SLP LONG TERM GOAL #3   Title Pt will ID and correct word finding errors with 95% accuracy given occasional min A over 2 sessions    Time 1    Period Weeks    Status On-going      SLP LONG TERM GOAL #4   Title Pt will report improved diet tolerance and reduced s/sx of aspiration with rare min over 2 sessions    Time 1    Period Weeks    Status On-going      SLP LONG TERM GOAL #5   Title Pt will undergo repeat objective swallow study as needed if s/sx of aspiration persist    Time 1    Period Weeks    Status On-going              Plan - 06/06/21 1829     Clinical Impression Statement Kara Bailey cont to present with changes in speech, cognitive communication, and swallowing s/p CVA in February 2022. Ongoing education and training provided to maximize and coordinate breath support while speaking to address verbal apraxia and dysarthria. Usual fading to occasional cues to reduce laryngel tension and optimize speaking on exhalation. Skilled ST is cont'd recommended to address speech, cognitive communication, and dysphagia impacting pt's overall safety and functional independence compared to baseline.    Speech Therapy Frequency 2x / week    Duration 8 weeks    Treatment/Interventions Aspiration precaution training;Oral motor exercises;Compensatory strategies;Pharyngeal strengthening exercises;Functional  tasks;Patient/family education;Diet toleration management by SLP;Cognitive reorganization;Multimodal communcation approach;Language facilitation;Compensatory techniques;Internal/external aids;SLP instruction and feedback    Potential to Achieve Goals Fair    Potential Considerations Ability to learn/carryover information;Severity of impairments    SLP Home Exercise Plan provided    Consulted and Agree with Plan of Care Patient             Patient will benefit from skilled therapeutic intervention in order to improve the following deficits and impairments:   Verbal apraxia  Dysarthria and anarthria  Cognitive communication deficit    Problem List Patient Active Problem List   Diagnosis Date Noted   Acute CVA (cerebrovascular accident) ([place]) 12/29/2020   Peripheral arterial disease ([place]) 12/20/2020   History of esophageal stricture 07/08/2020   Insomnia 12/02/2017   Chronic cough 08/14/2017   Poor appetite 08/29/2016   Peptic stricture of esophagus    History of colonic polyps    Hiatal hernia 03/16/2016   Odynophagia 03/16/2016   Dysphagia 03/16/2016   Alcohol abuse 10/26/2015   Essential hypertension 09/26/2015   PAC (premature atrial contraction) 10/11/2014   PVC (premature ventricular contraction) 10/11/2014   Back muscle spasm 06/15/2014   Generalized anxiety disorder 06/15/2014   Smoking 06/15/2014   Chronic hepatitis C without hepatic coma ([place]) 05/28/2013    Alinda Deem, MA CCC-SLP 06/06/2021, 6:31 PM  [facility] [address] [place], [place], [address] Phone: [phone]   Fax:  [phone]   Name: Kara Bailey MRN: [id number] Date of Birth: 22-Nov-1955

## 2021-06-06 NOTE — Therapy (Addendum)
[facility] [address] [place], [place], [address] Phone: [phone]   Fax:  [phone]  Physical Therapy Treatment  Patient Details  Name: Kara Bailey MRN: [id number] Date of Birth: 05/03/1956 Referring Provider (PT): Stowe, Shanna, NP   Encounter Date: 06/06/2021   PT End of Session - 06/06/21 1100     Visit Number 13    Number of Visits 17    Date for PT Re-Evaluation 07/17/21   written for 60 day POC   Authorization Type [employer] - auth from 6/7-07/17/21    PT Start Time 1018    PT Stop Time 1059    PT Time Calculation (min) 41 min    Activity Tolerance Patient tolerated treatment well    Behavior During Therapy WFL for tasks assessed/performed             Past Medical History:  Diagnosis Date   Anxiety    Chest pain 08/2014   unspecified   Cirrhosis (HCC)    Depression    Dizziness 08/2014   Dyspnea 08/2014   Emphysema lung (HCC)    GERD (gastroesophageal reflux disease)    Heart murmur    Hepatitis C    Hiatal hernia    Hypertension    PAC (premature atrial contraction) 10/11/2014   PAD (peripheral artery disease) (HCC)    Palpitations    PVC (premature ventricular contraction) 10/11/2014   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   Stroke (HCC)    just slurred speech   Tachycardia 08/2014   Ventral hernia    Weakness 08/2014    Past Surgical History:  Procedure Laterality Date   APPENDECTOMY     CATARACT EXTRACTION, BILATERAL     COLONOSCOPY     COLONOSCOPY WITH PROPOFOL N/A 04/30/2016   Procedure: COLONOSCOPY WITH PROPOFOL;  Surgeon: Robert M Rourk, MD;  [facility];  Service: Endoscopy;  Laterality: N/A;  1230   ESOPHAGOGASTRODUODENOSCOPY     approximately 2010   ESOPHAGOGASTRODUODENOSCOPY (EGD) WITH PROPOFOL N/A 04/30/2016   Procedure: ESOPHAGOGASTRODUODENOSCOPY (EGD) WITH PROPOFOL;  Surgeon: Robert M Rourk, MD;  [facility];  Service:  Endoscopy;  Laterality: N/A;   LEFT HEART CATHETERIZATION WITH CORONARY ANGIOGRAM N/A 10/29/2014   07-14-20- pt denies this Procedure: LEFT HEART CATHETERIZATION WITH CORONARY ANGIOGRAM;  Surgeon: Christopher D McAlhany, MD;  [facility]: [facility];  Service: Cardiovascular;  Laterality: N/A;   MALONEY DILATION N/A 04/30/2016   Procedure: MALONEY DILATION;  Surgeon: Robert M Rourk, MD;  [facility];  Service: Endoscopy;  Laterality: N/A;   POLYPECTOMY  04/30/2016   Procedure: POLYPECTOMY;  Surgeon: Robert M Rourk, MD;  [facility];  Service: Endoscopy;;  Sigmoid colon polyp removed via hot snare   UPPER GASTROINTESTINAL ENDOSCOPY      There were no vitals filed for this visit.   Subjective Assessment - 06/06/21 1021     Subjective Nothing new. Wants to keep working on steps.    Pertinent History smoking, PAD, HTN, ETOH abuse, chronic hepatitis C with hepatic coma,GAD, esophageal stricture    Limitations Walking;Standing    Patient Stated Goals wants to work on walking normally    Currently in Pain? No/denies                [facility] PT Assessment - 06/06/21 1022       Observation/Other Assessments   Focus on Therapeutic Outcomes (FOTO)  52.4%   60% predicted value       Berg Balance Test   Sit to Stand Able to stand without using hands and stabilize independently    Standing Unsupported Able to stand safely 2 minutes    Sitting with Back Unsupported but Feet Supported on Floor or Stool Able to sit safely and securely 2 minutes    Stand to Sit Sits safely with minimal use of hands    Transfers Able to transfer safely, minor use of hands    Standing Unsupported with Eyes Closed Able to stand 10 seconds safely    Standing Unsupported with Feet Together Able to place feet together independently and stand 1 minute safely    From Standing, Reach Forward with Outstretched Arm Can reach confidently >25 cm (10")    From Standing Position, Pick up Object from Floor Able  to pick up shoe, needs supervision    From Standing Position, Turn to Look Behind Over each Shoulder Looks behind from both sides and weight shifts well    Turn 360 Degrees Able to turn 360 degrees safely but slowly   8.34 to L, 7.35 to R   Standing Unsupported, Alternately Place Feet on Step/Stool Able to stand independently and safely and complete 8 steps in 20 seconds    Standing Unsupported, One Foot in Front Able to plae foot ahead of the other independently and hold 30 seconds    Standing on One Leg Able to lift leg independently and hold equal to or more than 3 seconds    Total Score 50    Berg comment: 50/56 = moderate fall risk      Timed Up and Go Test   Normal TUG (seconds) 15.93   no AD                          [facility] Adult PT Treatment/Exercise - 06/06/21 1052       Transfers   Transfers Sit to Stand;Stand to Sit    Sit to Stand 5: Supervision;With upper extremity assist    Stand to Sit 5: Supervision;With upper extremity assist    Comments stride stance sit to stand, LLE posterior, Cues to push through LLE and to activate gluts/quads upon standing. x3 reps at end of session, incr time to take to stand due to fatigue      Knee/Hip Exercises: Standing   Forward Step Up Both;5 reps;2 sets;Step Height: 4"    Forward Step Up Limitations beginning with UE support>none. 5 reps each leg, cues for sequencing, technique and to decr circumduction when stepping up. Then performed an additional x3 reps B alternating legs. Takes incr time to perform attempted first at 6" step but pt heavily dependent on BUE to perform                   PT Education - 06/06/21 1059     Education Details progress towards goals, discussed POC going forwards - getting scheduled for an additional 2x week for 6 weeks    Person(s) Educated Patient    Methods Explanation    Comprehension Verbalized understanding              PT Short Term Goals - 05/12/21 1012       PT  SHORT TERM GOAL #1   Title Pt will be independent with initial HEP in order to build upon functional gains made in therapy. ALL STGS DUE 05/16/21    Baseline needing reminder cues for technique    Time 4  Period Weeks    Status Partially Met    Target Date 05/16/21      PT SHORT TERM GOAL #2   Title Pt will finish BERG assessment to determine fall risk with LTG written    Baseline 43/56 - LTG written    Time 4    Period Weeks    Status Achieved      PT SHORT TERM GOAL #3   Title Pt will decr TUG time to 17 seconds or less with no AD in order to demo decr fall risk.    Baseline 20.47 seconds; 16.18 ON 05/09/21    Time 4    Period Weeks    Status Achieved      PT SHORT TERM GOAL #4   Title Pt will improve gait speed to at least 2.2 ft/sec with no AD vs. LRAD in order to demo decr fall risk.    Baseline 1.97 ft/sec; 1.83 ft/sec with no AD    Time 4    Period Weeks    Status Not Met      PT SHORT TERM GOAL #5   Title Pt will decr 5x sit <> stand time to 50 seconds or less in order to demo improved BLE strength and balance.    Baseline 64.32 seconds no UE support; 31.47 seconds on 05/09/21    Time 4    Period Weeks    Status Achieved               PT Long Term Goals - 06/06/21 1025       PT LONG TERM GOAL #1   Title Pt will improve BERG score to at least a 48/56 in order to demo decr fall risk. ALL LTGS DUE 06/13/21    Baseline 43/56; 50/56 on 06/06/21    Time 8    Period Weeks    Status Achieved      PT LONG TERM GOAL #2   Title Pt will go ascend and descend 4 steps with single handrail vs. use of LRAD with supervision in order to be able to go to her son's house.    Baseline can not perform stairs    Time 8    Period Weeks    Status New      PT LONG TERM GOAL #3   Title Pt will decr 5x sit <> stand time to 25 seconds or less in order to demo improved BLE strength and balance.    Baseline 64.32 seconds no UE support; 31.47 seconds on 05/09/21 with no UE support     Time 8    Period Weeks    Status Revised      PT LONG TERM GOAL #4   Title Pt will ambulate at least 300' outdoors over unlevel paved surfaces with LRAD in order to demo improved community mobility.    Baseline not yet assessed.    Time 8    Period Weeks    Status New      PT LONG TERM GOAL #5   Title Pt will decr TUG time to 13.5 seconds or less with no AD in order to demo decr fall risk.    Baseline 20.47 seconds; 16.18 ON 05/09/21; 15.93 ON 06/06/21    Time 8    Period Weeks    Status Not Met      PT LONG TERM GOAL #6   Title Pt will improve FOTO score to at least 60% in order to demo improved functional outcomes.  Baseline 50%; 52.4% on 06/06/21    Time 8    Period Weeks    Status Not Met                  Plan - 06/06/21 1159     Clinical Impression Statement Began to check LTGs today. Pt met LTG #1 in regards to BERG - improved to 50/56 (previously was 43/56), putting pt at a moderate risk for falls. Pt did not meet LTG #5 and #6. Pt performed TUG in 15.93 seconds with no AD, still putting pt at an incr risk for falls. Pt scored a 52.4% on FOTO today (initially 50%) with goal being 60%.   Pt remains more reliant on RLE, esp when fatigued, during sit <> stand transfers. When fatigued towards end of session, pt demonstrates decr RLE foot clearance with a circumduction pattern. Pt plans to order foot up brace next month. Will check LTGs and next session and perform re-cert.    Personal Factors and Comorbidities Comorbidity 3+;Past/Current Experience    Comorbidities smoking, PAD, HTN, ETOH abuse, chronic hepatitis C with hepatic coma,GAD, esophageal stricture    Examination-Activity Limitations Locomotion Level;Transfers;Stairs;Squat;Carry    Examination-Participation Restrictions Cleaning;Community Activity;Laundry    Stability/Clinical Decision Making Stable/Uncomplicated    Rehab Potential Good    PT Frequency 2x / week    PT Duration 8 weeks    PT  Treatment/Interventions ADLs/Self Care Home Management;DME Instruction;Gait training;Stair training;Therapeutic activities;Functional mobility training;Therapeutic exercise;Balance training;Neuromuscular re-education;Orthotic Fit/Training;Patient/family education;Vestibular    PT Next Visit Plan check remainder of LTGs. review HEP and update. stair training. 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)  Muscle weakness (generalized)     Problem List Patient Active Problem List   Diagnosis Date Noted   Acute CVA (cerebrovascular accident) ([place]) 12/29/2020   Peripheral arterial disease ([place]) 12/20/2020   History of esophageal stricture 07/08/2020   Insomnia 12/02/2017   Chronic cough 08/14/2017   Poor appetite 08/29/2016   Peptic stricture of esophagus    History of colonic polyps    Hiatal hernia 03/16/2016   Odynophagia 03/16/2016   Dysphagia 03/16/2016   Alcohol abuse 10/26/2015   Essential hypertension 09/26/2015   PAC (premature atrial contraction) 10/11/2014   PVC (premature ventricular contraction) 10/11/2014   Back muscle spasm 06/15/2014   Generalized anxiety disorder 06/15/2014   Smoking 06/15/2014   Chronic hepatitis C without hepatic coma ([place]) 05/28/2013    Arliss Journey, PT, DPT  06/06/2021, 12:01 PM  [facility] [address] [place], [place], [address] Phone: [phone]   Fax:  [phone]  Name: Kara Bailey MRN: [id number] Date of Birth: 31-May-1956

## 2021-06-09 ENCOUNTER — Ambulatory Visit: Payer: Medicare HMO | Admitting: Physical Therapy

## 2021-06-09 ENCOUNTER — Encounter: Payer: Self-pay | Admitting: Physical Therapy

## 2021-06-09 ENCOUNTER — Other Ambulatory Visit: Payer: Self-pay

## 2021-06-09 ENCOUNTER — Ambulatory Visit: Payer: Medicare HMO

## 2021-06-09 DIAGNOSIS — R4701 Aphasia: Secondary | ICD-10-CM

## 2021-06-09 DIAGNOSIS — I69851 Hemiplegia and hemiparesis following other cerebrovascular disease affecting right dominant side: Secondary | ICD-10-CM

## 2021-06-09 DIAGNOSIS — R482 Apraxia: Secondary | ICD-10-CM

## 2021-06-09 DIAGNOSIS — R2681 Unsteadiness on feet: Secondary | ICD-10-CM

## 2021-06-09 DIAGNOSIS — R29818 Other symptoms and signs involving the nervous system: Secondary | ICD-10-CM

## 2021-06-09 DIAGNOSIS — R41841 Cognitive communication deficit: Secondary | ICD-10-CM

## 2021-06-09 DIAGNOSIS — M6281 Muscle weakness (generalized): Secondary | ICD-10-CM

## 2021-06-09 DIAGNOSIS — R471 Dysarthria and anarthria: Secondary | ICD-10-CM

## 2021-06-09 DIAGNOSIS — R1310 Dysphagia, unspecified: Secondary | ICD-10-CM

## 2021-06-09 NOTE — Therapy (Signed)
[facility] [address] [place], [place], [address] Phone: [phone]   Fax:  [phone]  Speech Language Pathology Treatment/Recertification  Patient Details  Name: Kara Bailey MRN: [id number] Date of Birth: Aug 19, 1956 Referring Provider (SLP): Cipriano Mile, NP   Encounter Date: 06/09/2021   End of Session - 06/09/21 1143     Visit Number 15    Number of Visits 31   re-cert for 8 more weeks for 16 more visits   Date for SLP Re-Evaluation 08/04/21    Authorization Type [employer]    SLP Start Time 1100    SLP Stop Time  0093    SLP Time Calculation (min) 45 min    Activity Tolerance Patient tolerated treatment well             Past Medical History:  Diagnosis Date   Anxiety    Chest pain 08/2014   unspecified   Cirrhosis ([place])    Depression    Dizziness 08/2014   Dyspnea 08/2014   Emphysema lung ([facility])    GERD (gastroesophageal reflux disease)    Heart murmur    Hepatitis C    Hiatal hernia    Hypertension    PAC (premature atrial contraction) 10/11/2014   PAD (peripheral artery disease) ([facility])    Palpitations    PVC (premature ventricular contraction) 10/11/2014   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   Stroke ([place])    just slurred speech   Tachycardia 08/2014   Ventral hernia    Weakness 08/2014    Past Surgical History:  Procedure Laterality Date   APPENDECTOMY     CATARACT EXTRACTION, BILATERAL     COLONOSCOPY     COLONOSCOPY WITH PROPOFOL N/A 04/30/2016   Procedure: COLONOSCOPY WITH PROPOFOL;  Surgeon: Daneil Dolin, MD;  [facility]: [facility];  Service: Endoscopy;  Laterality: N/A;  1230   ESOPHAGOGASTRODUODENOSCOPY     approximately 2010   ESOPHAGOGASTRODUODENOSCOPY (EGD) WITH PROPOFOL N/A 04/30/2016   Procedure: ESOPHAGOGASTRODUODENOSCOPY (EGD) WITH PROPOFOL;  Surgeon: Daneil Dolin, MD;  [facility]: [facility];  Service: Endoscopy;  Laterality: N/A;    LEFT HEART CATHETERIZATION WITH CORONARY ANGIOGRAM N/A 10/29/2014   07-14-20- pt denies this Procedure: LEFT HEART CATHETERIZATION WITH CORONARY ANGIOGRAM;  Surgeon: Burnell Blanks, MD;  Location: [facility] CATH [facility];  Service: Cardiovascular;  Laterality: N/A;   MALONEY DILATION N/A 04/30/2016   Procedure: Venia Minks DILATION;  Surgeon: Daneil Dolin, MD;  Location: [facility];  Service: Endoscopy;  Laterality: N/A;   POLYPECTOMY  04/30/2016   Procedure: POLYPECTOMY;  Surgeon: Daneil Dolin, MD;  Location: [facility];  Service: Endoscopy;;  Sigmoid colon polyp removed via hot snare   UPPER GASTROINTESTINAL ENDOSCOPY      There were no vitals filed for this visit.   Subjective Assessment - 06/09/21 1627     Subjective "I've been practicing the (abdominal) breathing"    Currently in Pain? No/denies                   ADULT SLP TREATMENT - 06/09/21 0001       General Information   Behavior/Cognition Alert;Cooperative;Pleasant mood      Treatment Provided   Treatment provided Cognitive-Linquistic      Cognitive-Linquistic Treatment   Treatment focused on Apraxia;Dysarthria;Aphasia;Cognition    Skilled Treatment Focus on abdominal breathing reported at home to aid respiratory and speaking coordination secondary to apraxia. SLP targeted use of flow phonation (breathing and  voicing single words into tissue). Usual fading to occasional cues required to inhale through nose, exhale through mouth. Less frequent breath hold noted as task continued. Usual rest breaks required due to SOB. SLP engaged patient in simple conversation to assess carryover of compensations, including slow rate and more frequent inhalations to maximize breath support. Occasional cues required to increase carryover of strategies in simple conversation.      Assessment / Recommendations / Plan   Plan Continue with current plan of care;Goals updated      Progression Toward Goals   Progression toward goals  Progressing toward goals              SLP Education - 06/09/21 1630     Education Details flow phonation into tissue for feedback, relaxation techniques    Person(s) Educated Patient    Methods Explanation;Demonstration    Comprehension Verbalized understanding;Returned demonstration;Need further instruction              SLP Short Term Goals - 06/09/21 1658       SLP SHORT TERM GOAL #1   Title Pt will demonstrate dysarthria compensations in 10 minute simple to mod complex conversation with min A over 2 sessions    Status Not Met      SLP SHORT TERM GOAL #2   Title Pt will use memory/attention compensations for appointments, medicine management, and other daily activities with occasional min A over 3 sessions.    Baseline 05-12-21    Status Partially Met      SLP SHORT TERM GOAL #3   Title Pt will ID and correct word finding errors with 80% accuracy given occasional min A over 2 sessions    Baseline 05-12-21    Status Partially Met      SLP SHORT TERM GOAL #4   Title Pt will verbalize 3 safety concerns in the home and generate solutions to them with occasional min A over 2 sessions    Baseline 05-12-21    Status Partially Met      SLP SHORT TERM GOAL #5   Title Pt will complete dysphagia HEP to optimize swallow function with occasional min A over 2 sessions    Status Not Met      Additional Short Term Goals   Additional Short Term Goals Yes      SLP SHORT TERM GOAL #6   Title Pt will exhibit improved oral coordination on targeted apraxia tasks with occasional min A over 2 sessions    Status Not Met      SLP SHORT TERM GOAL #7   Title Pt will generate abdominal breathing in 16/20 answers to SLP questions over 2 sessions    Time 4    Period Weeks    Status New    Target Date 07/07/21      SLP SHORT TERM GOAL #8   Title Pt will be 90% intelligible in response to open ended SLP questions with rare min A over 2 sessions    Time 4    Period Weeks    Status New     Target Date 07/07/21              SLP Long Term Goals - 06/09/21 1136       SLP LONG TERM GOAL #1   Title Pt will demonstrate dysarthria compensations in 10 minute simple to mod complex conversation with min A over 2 sessions    Time 8    Period Weeks  Status Revised    Target Date 08/04/21      SLP LONG TERM GOAL #2   Title Pt will use memory/attention compensations for appointments, medicine management, and other daily activities with occasional min A over 3 sessions.    Time 8    Period Weeks    Status Revised    Target Date 08/04/21      SLP LONG TERM GOAL #3   Title Pt will ID and correct word finding errors with 95% accuracy given occasional min A over 2 sessions    Time --    Period --    Status Partially Met      SLP LONG TERM GOAL #4   Title Pt will report improved diet tolerance and reduced s/sx of aspiration with rare min over 4 sessions    Baseline 06-09-21    Time 8    Period Weeks    Status Revised    Target Date 08/04/21      SLP LONG TERM GOAL #5   Title Pt will undergo repeat objective swallow study as needed if s/sx of aspiration persist or change in respiration indicated    Time 8    Period Weeks    Status Revised    Target Date 08/04/21      Additional Long Term Goals   Additional Long Term Goals Yes      SLP LONG TERM GOAL #6   Title Pt will generate abdominal breathing throughout 10 minute conversation occasional min A over 2 sessions    Time 8    Period Weeks    Status New    Target Date 08/04/21              Plan - 06/09/21 1634     Clinical Impression Statement Shenika cont to present with changes in speech, cognitive communication, and swallowing s/p CVA in February 2022. Ongoing education and training provided to maximize and coordinate breath support while speaking to address verbal apraxia and dysarthria. Slow progress exhibited to consistently utilize abdominal breathing to maximize breath support. Usual fading to  occasional cues needed to reduce laryngel tension and optimize speaking on exhalation. Intermittent dysarthria exhibited impacting speech intelligibility in conversation. Occasional word finding exhibited, which appears to also impact fluency of speech. Pt continues to intermittent c/o swallowing difficulty, with some improvements using compensatory strategies (head turn, small bites, slow rate). Pt endorsed difficulty with mixed consistency today. SLP recommended continuation of ST intervention as pt has not yet met max rehab potential. Skilled ST is recommended to address speech, cognitive communication, and dysphagia impacting pt's overall safety and functional independence compared to baseline.    Speech Therapy Frequency 2x / week    Duration 8 weeks   re-cert for 2x/week for 8 more weeks   Treatment/Interventions Aspiration precaution training;Oral motor exercises;Compensatory strategies;Pharyngeal strengthening exercises;Functional tasks;Patient/family education;Diet toleration management by SLP;Cognitive reorganization;Multimodal communcation approach;Language facilitation;Compensatory techniques;Internal/external aids;SLP instruction and feedback    Potential to Achieve Goals Fair    Potential Considerations Ability to learn/carryover information;Severity of impairments    SLP Home Exercise Plan provided    Consulted and Agree with Plan of Care Patient             Patient will benefit from skilled therapeutic intervention in order to improve the following deficits and impairments:   Verbal apraxia  Dysarthria and anarthria  Aphasia  Cognitive communication deficit  Dysphagia, unspecified type    Problem List Patient Active Problem List   Diagnosis Date  Noted   Acute CVA (cerebrovascular accident) ([place]) 12/29/2020   Peripheral arterial disease ([place]) 12/20/2020   History of esophageal stricture 07/08/2020   Insomnia 12/02/2017   Chronic cough 08/14/2017   Poor appetite  08/29/2016   Peptic stricture of esophagus    History of colonic polyps    Hiatal hernia 03/16/2016   Odynophagia 03/16/2016   Dysphagia 03/16/2016   Alcohol abuse 10/26/2015   Essential hypertension 09/26/2015   PAC (premature atrial contraction) 10/11/2014   PVC (premature ventricular contraction) 10/11/2014   Back muscle spasm 06/15/2014   Generalized anxiety disorder 06/15/2014   Smoking 06/15/2014   Chronic hepatitis C without hepatic coma ([place]) 05/28/2013    Alinda Deem, MA CCC-SLP 06/09/2021, 5:01 PM  [facility] [address] [place], [place], [address] Phone: [phone]   Fax:  [phone]   Name: ADLEAN HARDEMAN MRN: [id number] Date of Birth: Sep 28, 1956

## 2021-06-09 NOTE — Therapy (Signed)
[facility] [address] [place], [place], [address] Phone: [phone]   Fax:  [phone]  Physical Therapy Treatment/Re-Cert  Patient Details  Name: Kara Bailey MRN: [id number] Date of Birth: May 17, 1956 Referring Provider (PT): Cipriano Mile, NP   Encounter Date: 06/09/2021   PT End of Session - 06/09/21 1356     Visit Number 14    Number of Visits 26    Date for PT Re-Evaluation 08/08/21    Authorization Type [employer] - auth from 6/7-07/17/21, requesting additional auth    PT Start Time 1018    PT Stop Time 1058    PT Time Calculation (min) 40 min    Activity Tolerance Patient tolerated treatment well    Behavior During Therapy [facility] for tasks assessed/performed             Past Medical History:  Diagnosis Date   Anxiety    Chest pain 08/2014   unspecified   Cirrhosis ([place])    Depression    Dizziness 08/2014   Dyspnea 08/2014   Emphysema lung (HCC)    GERD (gastroesophageal reflux disease)    Heart murmur    Hepatitis C    Hiatal hernia    Hypertension    PAC (premature atrial contraction) 10/11/2014   PAD (peripheral artery disease) (HCC)    Palpitations    PVC (premature ventricular contraction) 10/11/2014   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   Stroke ([place])    just slurred speech   Tachycardia 08/2014   Ventral hernia    Weakness 08/2014    Past Surgical History:  Procedure Laterality Date   APPENDECTOMY     CATARACT EXTRACTION, BILATERAL     COLONOSCOPY     COLONOSCOPY WITH PROPOFOL N/A 04/30/2016   Procedure: COLONOSCOPY WITH PROPOFOL;  Surgeon: Daneil Dolin, MD;  [facility];  Service: Endoscopy;  Laterality: N/A;  1230   ESOPHAGOGASTRODUODENOSCOPY     approximately 2010   ESOPHAGOGASTRODUODENOSCOPY (EGD) WITH PROPOFOL N/A 04/30/2016   Procedure: ESOPHAGOGASTRODUODENOSCOPY (EGD) WITH PROPOFOL;  Surgeon: Daneil Dolin, MD;  [facility];  Service: Endoscopy;  Laterality: N/A;   LEFT HEART CATHETERIZATION WITH CORONARY ANGIOGRAM N/A 10/29/2014   07-14-20- pt denies this Procedure: LEFT HEART CATHETERIZATION WITH CORONARY ANGIOGRAM;  Surgeon: Burnell Blanks, MD;  Location: [facility] CATH LAB;  Service: Cardiovascular;  Laterality: N/A;   MALONEY DILATION N/A 04/30/2016   Procedure: Venia Minks DILATION;  Surgeon: Daneil Dolin, MD;  Location: [facility];  Service: Endoscopy;  Laterality: N/A;   POLYPECTOMY  04/30/2016   Procedure: POLYPECTOMY;  Surgeon: Daneil Dolin, MD;  [facility]: [facility];  Service: Endoscopy;;  Sigmoid colon polyp removed via hot snare   UPPER GASTROINTESTINAL ENDOSCOPY      There were no vitals filed for this visit.   Subjective Assessment - 06/09/21 1021     Subjective No changes, no falls. Will be ordering foot up brace on monday.    Pertinent History smoking, PAD, HTN, ETOH abuse, chronic hepatitis C with hepatic coma,GAD, esophageal stricture    Limitations Walking;Standing    Patient Stated Goals wants to work on walking normally    Currently in Pain? No/denies                [facility] PT Assessment - 06/09/21 1023       Assessment   Medical Diagnosis CVA    Referring Provider (PT) Cipriano Mile, NP  Onset Date/Surgical Date 12/29/20      Prior Function   Level of Independence Independent                           [facility] Adult PT Treatment/Exercise - 06/09/21 1023       Transfers   Transfers Sit to Stand;Stand to Sit    Sit to Stand 5: Supervision;Without upper extremity assist;From chair/3-in-1    Five time sit to stand comments  23.75 seconds from standard height chair    Stand to Sit 5: Supervision;Without upper extremity assist      Ambulation/Gait   Ambulation/Gait Yes    Ambulation/Gait Assistance 5: Supervision;4: Min guard    Ambulation/Gait Assistance Details throughout session with no AD, min guard during one instance due to pt having decr  foot clearance with LLE    Assistive device None    Gait Pattern Decreased arm swing - right;Step-through pattern;Decreased stance time - right;Decreased hip/knee flexion - right;Decreased dorsiflexion - right;Poor foot clearance - right;Lateral trunk lean to right;Poor foot clearance - left    Ambulation Surface Level;Indoor    Stairs Yes    Stairs Assistance 4: Min guard    Stairs Assistance Details (indicate cue type and reason) performed x2 reps with use of B handrails with step to pattern ascending/descending (ascending with stronger RLE and descending with LLE)- pt with incr difficulty when descending. then performed with single handrail on L (to simulate pt's son's house) - performed going up sideways (leading with stronger RLE) and coming down backwards both with BUE support with cues for technique. pt with decr stance time on LLE. pt to go over to son's this afternoon, discussed making sure someone is with her when performing steps for incr safety    Stair Management Technique One rail Left;Step to pattern;Sideways;Forwards;Two rails;Backwards    Number of Stairs 4   x4 reps   Height of Stairs 6               Access Code: [email or web address] URL: [email or web address] Date: 06/09/2021 Prepared by: Janann August  Reviewed exercises to HEP:   Exercises Supine Bridge - 1 x daily - 5 x weekly - 1 sets - 10 reps Seated Heel Toe Raises - 2 x daily - 5 x weekly - 2 sets - 10 reps Alternating Step Taps with Counter Support - 2 x daily - 5 x weekly - 1 sets - 10 reps - upgraded for incr SLS time to holding for 5-10 seconds  Seated Long Arc Quad - 2 x daily - 5 x weekly - 2 sets - 10 reps - upgraded to use of yellow tband Standing Gastroc Stretch - 2 x daily - 5 x weekly - 2 sets - 30 hold Standing Tandem Balance with Counter Support - 1 x daily - 5 x weekly - 3 sets - 20 hold     PT Education - 06/09/21 1356     Education Details updates to HEP, stair training    Person(s)  Educated Patient    Methods Explanation;Demonstration    Comprehension Verbalized understanding;Returned demonstration;Need further instruction              PT Short Term Goals - 05/12/21 1012       PT SHORT TERM GOAL #1   Title Pt will be independent with initial HEP in order to build upon functional gains made in therapy. ALL STGS DUE 05/16/21    Baseline needing reminder cues  for technique    Time 4    Period Weeks    Status Partially Met    Target Date 05/16/21      PT SHORT TERM GOAL #2   Title Pt will finish BERG assessment to determine fall risk with LTG written    Baseline 43/56 - LTG written    Time 4    Period Weeks    Status Achieved      PT SHORT TERM GOAL #3   Title Pt will decr TUG time to 17 seconds or less with no AD in order to demo decr fall risk.    Baseline 20.47 seconds; 16.18 ON 05/09/21    Time 4    Period Weeks    Status Achieved      PT SHORT TERM GOAL #4   Title Pt will improve gait speed to at least 2.2 ft/sec with no AD vs. LRAD in order to demo decr fall risk.    Baseline 1.97 ft/sec; 1.83 ft/sec with no AD    Time 4    Period Weeks    Status Not Met      PT SHORT TERM GOAL #5   Title Pt will decr 5x sit <> stand time to 50 seconds or less in order to demo improved BLE strength and balance.    Baseline 64.32 seconds no UE support; 31.47 seconds on 05/09/21    Time 4    Period Weeks    Status Achieved            Updated STGs:  PT Short Term Goals - 06/09/21 1409       PT SHORT TERM GOAL #1   Title Pt will decr TUG time to 14 seconds or less in order to demo decr fall risk. ALL STGS DUE 06/30/21    Baseline 15.93 seconds on 06/06/21    Time 3    Period Weeks    Status New    Target Date 06/30/21      PT SHORT TERM GOAL #2   Title Pt will undego 2-3 MWT with LTG to be written as appropriate with use of foot up brace to determine improved gait endurance.    Baseline not yet assessed.    Time 3    Period Weeks    Status New       PT SHORT TERM GOAL #3   Title Pt will improve gait speed to at least 2.2 ft/sec with no AD vs. LRAD in order to demo decr fall risk.    Baseline last assessed 1.83 ft/sec no AD    Time 3    Period Weeks    Status New      PT SHORT TERM GOAL #4   Title --    Baseline --    Time --    Period --    Status --                PT Long Term Goals - 06/09/21 1024       PT LONG TERM GOAL #1   Title Pt will improve BERG score to at least a 48/56 in order to demo decr fall risk. ALL LTGS DUE 06/13/21    Baseline 43/56; 50/56 on 06/06/21    Time 8    Period Weeks    Status Achieved      PT LONG TERM GOAL #2   Title Pt will go ascend and descend 4 steps with single handrail vs. use of LRAD  with supervision in order to be able to go to her son's house.    Baseline min guard needed on 06/09/21    Time 8    Period Weeks    Status Not Met      PT LONG TERM GOAL #3   Title Pt will decr 5x sit <> stand time to 25 seconds or less in order to demo improved BLE strength and balance.    Baseline 31.47 seconds on 05/09/21 with no UE support; 23.75 seconds from standard height chair on 06/09/21    Time 8    Period Weeks    Status Achieved      PT LONG TERM GOAL #4   Title Pt will ambulate at least 300' outdoors over unlevel paved surfaces with LRAD in order to demo improved community mobility.    Baseline did not perform on 06/09/21    Time 8    Period Weeks    Status Deferred      PT LONG TERM GOAL #5   Title Pt will decr TUG time to 13.5 seconds or less with no AD in order to demo decr fall risk.    Baseline 20.47 seconds; 16.18 ON 05/09/21; 15.93 ON 06/06/21    Time 8    Period Weeks    Status Not Met      PT LONG TERM GOAL #6   Title Pt will improve FOTO score to at least 60% in order to demo improved functional outcomes.    Baseline 50%; 52.4% on 06/06/21    Time 8    Period Weeks    Status Not Met            Updated LTGs:  PT Long Term Goals - 06/09/21 1024       PT  LONG TERM GOAL #1   Title Pt will be independent with final HEP in order to build upon functional gains made in therapy ALL LTGS DUE 06/13/21    Baseline --    Time 6    Period Weeks    Status New      PT LONG TERM GOAL #2   Title Pt will go ascend and descend 4 steps with single handrail vs. use of LRAD with supervision in order to be able to go to her son's house.    Baseline min guard needed on 06/09/21    Time 6    Period Weeks    Status On-going      PT LONG TERM GOAL #3   Title Pt will decr 5x sit <> stand time to 20 seconds or less in order to demo improved BLE strength and balance.    Baseline 31.47 seconds on 05/09/21 with no UE support; 23.75 seconds from standard height chair on 06/09/21    Time 6    Period Weeks    Status Revised      PT LONG TERM GOAL #4   Title 2-3 MWT goal to be written as appropriate in order to demo improved gait endurance.    Baseline --    Time 6    Period Weeks    Status New      PT LONG TERM GOAL #5   Title --    Baseline --    Time --    Period --    Status --      PT LONG TERM GOAL #6   Title Pt will improve FOTO score to at least 60% in order to demo improved  functional outcomes.    Baseline 50%; 52.4% on 06/06/21    Time 6    Period Weeks    Status On-going                   Plan - 06/09/21 1403     Clinical Impression Statement Today's skilled session focused on assessing remainder of LTGs for re-cert. Pt did not meet LTG #2 in regards to stairs, needs min guard for safety for steps and needs to use BUE support on single handrail to perform. Pt did improve 5x sit <> stand with no UE support to 23.75 seconds (previously was 31.47 seconds), however pt does still perform with incr weight shift to RLE. When more fatigued with gait pt demonstrates decr foot clearance (sometimes with LLE other times with RLE) - pt will be ordering a foot up brace in the beginning of August. Will re-cert for an additional 2x week for 6 weeks to  continue to work on functional transfers/stairs, gait training, balance, strength in order to improve functional independence and decr pt's fall risk. STGs and LTGs updated as appropriate.    Personal Factors and Comorbidities Comorbidity 3+;Past/Current Experience    Comorbidities smoking, PAD, HTN, ETOH abuse, chronic hepatitis C with hepatic coma,GAD, esophageal stricture    Examination-Activity Limitations Locomotion Level;Transfers;Stairs;Squat;Carry    Examination-Participation Restrictions Cleaning;Community Activity;Laundry    Stability/Clinical Decision Making Stable/Uncomplicated    Rehab Potential Good    PT Frequency 2x / week   12 visits over 8 weeks   PT Duration 8 weeks   12 visits over 8 weeks   PT Treatment/Interventions ADLs/Self Care Home Management;DME Instruction;Gait training;Stair training;Therapeutic activities;Functional mobility training;Therapeutic exercise;Balance training;Neuromuscular re-education;Orthotic Fit/Training;Patient/family education;Vestibular    PT Next Visit Plan Perform either 2 or 3MWT with goal written. did pt get foot up brace? (could use with either RLE/LLE). weight shifting activities for LLE. NuStep/SciFit for endurance. BLE strength. stair training. 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)  Muscle weakness  (generalized)     Problem List Patient Active Problem List   Diagnosis Date Noted   Acute CVA (cerebrovascular accident) ([place]) 12/29/2020   Peripheral arterial disease ([place]) 12/20/2020   History of esophageal stricture 07/08/2020   Insomnia 12/02/2017   Chronic cough 08/14/2017   Poor appetite 08/29/2016   Peptic stricture of esophagus    History of colonic polyps    Hiatal hernia 03/16/2016   Odynophagia 03/16/2016   Dysphagia 03/16/2016   Alcohol abuse 10/26/2015   Essential hypertension 09/26/2015   PAC (premature atrial contraction) 10/11/2014   PVC (premature ventricular contraction) 10/11/2014   Back muscle spasm 06/15/2014   Generalized anxiety disorder 06/15/2014   Smoking 06/15/2014   Chronic hepatitis C without hepatic coma ([place]) 05/28/2013    Arliss Journey, PT, DPT  06/09/2021, 2:08 PM  [facility] [address] [place], [place], [address] Phone: [phone]  Fax:  [phone]  Name: Kara Bailey MRN: [id number] Date of Birth: April 24, 1956

## 2021-06-13 ENCOUNTER — Ambulatory Visit: Payer: Medicare HMO | Admitting: Occupational Therapy

## 2021-06-15 ENCOUNTER — Other Ambulatory Visit: Payer: Self-pay

## 2021-06-15 ENCOUNTER — Ambulatory Visit: Payer: Medicare HMO | Attending: Student | Admitting: Physical Therapy

## 2021-06-15 ENCOUNTER — Encounter: Payer: Self-pay | Admitting: Physical Therapy

## 2021-06-15 VITALS — BP 128/86 | HR 99

## 2021-06-15 DIAGNOSIS — R41841 Cognitive communication deficit: Secondary | ICD-10-CM | POA: Insufficient documentation

## 2021-06-15 DIAGNOSIS — R471 Dysarthria and anarthria: Secondary | ICD-10-CM | POA: Diagnosis present

## 2021-06-15 DIAGNOSIS — R1310 Dysphagia, unspecified: Secondary | ICD-10-CM | POA: Insufficient documentation

## 2021-06-15 DIAGNOSIS — R4701 Aphasia: Secondary | ICD-10-CM | POA: Diagnosis present

## 2021-06-15 DIAGNOSIS — M6281 Muscle weakness (generalized): Secondary | ICD-10-CM | POA: Diagnosis present

## 2021-06-15 DIAGNOSIS — I69851 Hemiplegia and hemiparesis following other cerebrovascular disease affecting right dominant side: Secondary | ICD-10-CM

## 2021-06-15 DIAGNOSIS — R29818 Other symptoms and signs involving the nervous system: Secondary | ICD-10-CM

## 2021-06-15 DIAGNOSIS — R482 Apraxia: Secondary | ICD-10-CM | POA: Diagnosis present

## 2021-06-15 DIAGNOSIS — R2681 Unsteadiness on feet: Secondary | ICD-10-CM | POA: Diagnosis present

## 2021-06-15 DIAGNOSIS — R41840 Attention and concentration deficit: Secondary | ICD-10-CM | POA: Diagnosis present

## 2021-06-15 DIAGNOSIS — R278 Other lack of coordination: Secondary | ICD-10-CM | POA: Diagnosis present

## 2021-06-15 NOTE — Therapy (Signed)
[facility] [address] [place], [place], [address] Phone: [phone]   Fax:  [phone]  Physical Therapy Treatment  Patient Details  Name: Kara Bailey MRN: [id number] Date of Birth: [birth year] Referring Provider (PT): Cipriano Mile, NP   Encounter Date: 06/15/2021   PT End of Session - 06/15/21 1023     Visit Number 15    Number of Visits 26    Date for PT Re-Evaluation 08/08/21    Authorization Type [employer] -    Authorization Time Period 16 visits from 67/22- 07/17/21.    Authorization - Visit Number 14    Authorization - Number of Visits 16    Progress Note Due on Visit 44    PT Start Time [id number]    PT Stop Time 1100    PT Time Calculation (min) 42 min    Equipment Utilized During Treatment Gait belt    Activity Tolerance Patient tolerated treatment well    Behavior During Therapy WFL for tasks assessed/performed             Past Medical History:  Diagnosis Date   Anxiety    Chest pain 08/2014   unspecified   Cirrhosis ([place])    Depression    Dizziness 08/2014   Dyspnea 08/2014   Emphysema lung (HCC)    GERD (gastroesophageal reflux disease)    Heart murmur    Hepatitis C    Hiatal hernia    Hypertension    PAC (premature atrial contraction) 10/11/2014   PAD (peripheral artery disease) (HCC)    Palpitations    PVC (premature ventricular contraction) 10/11/2014   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   Stroke ([place])    just slurred speech   Tachycardia 08/2014   Ventral hernia    Weakness 08/2014    Past Surgical History:  Procedure Laterality Date   APPENDECTOMY     CATARACT EXTRACTION, BILATERAL     COLONOSCOPY     COLONOSCOPY WITH PROPOFOL N/A 04/30/2016   Procedure: COLONOSCOPY WITH PROPOFOL;  Surgeon: Daneil Dolin, MD;  [facility];  Service: Endoscopy;  Laterality: N/A;  1230   ESOPHAGOGASTRODUODENOSCOPY     approximately 2010    ESOPHAGOGASTRODUODENOSCOPY (EGD) WITH PROPOFOL N/A 04/30/2016   Procedure: ESOPHAGOGASTRODUODENOSCOPY (EGD) WITH PROPOFOL;  Surgeon: Daneil Dolin, MD;  [facility];  Service: Endoscopy;  Laterality: N/A;   LEFT HEART CATHETERIZATION WITH CORONARY ANGIOGRAM N/A 10/29/2014   07-14-20- pt denies this Procedure: LEFT HEART CATHETERIZATION WITH CORONARY ANGIOGRAM;  Surgeon: Burnell Blanks, MD;  Location: [facility] CATH LAB;  Service: Cardiovascular;  Laterality: N/A;   MALONEY DILATION N/A 04/30/2016   Procedure: Venia Minks DILATION;  Surgeon: Daneil Dolin, MD;  [facility];  Service: Endoscopy;  Laterality: N/A;   POLYPECTOMY  04/30/2016   Procedure: POLYPECTOMY;  Surgeon: Daneil Dolin, MD;  [facility];  Service: Endoscopy;;  Sigmoid colon polyp removed via hot snare   UPPER GASTROINTESTINAL ENDOSCOPY      Vitals:   06/15/21 1022 06/15/21 1053  BP: (!) 152/97 128/86  Pulse: (!) 101 99     Subjective Assessment - 06/15/21 1022     Subjective No new complaints. Needs help ordering the foot up brace online.    Pertinent History smoking, PAD, HTN, ETOH abuse, chronic hepatitis C with hepatic coma,GAD, esophageal stricture    Limitations Walking;Standing    Patient Stated Goals wants to work on walking normally  Currently in Pain? No/denies    Pain Score 0-No pain                [facility] PT Assessment - 06/15/21 1023       6 Minute Walk- Baseline   6 Minute Walk- Baseline yes    BP (mmHg) 163/83    HR (bpm) 107    02 Sat (%RA) 97 %    Modified Borg Scale for Dyspnea 0- Nothing at all    Perceived Rate of Exertion (Borg) 6-      6 Minute walk- Post Test   6 Minute Walk Post Test yes    BP (mmHg) 170/98    HR (bpm) 105    02 Sat (%RA) 97 %    Modified Borg Scale for Dyspnea 2- Mild shortness of breath    Perceived Rate of Exertion (Borg) 17- Very hard      6 minute walk test results    Aerobic Endurance Distance Walked 360    Endurance  additional comments 3 minute walk test done instead of 6 minutes. no AD and no rest breaks. Min guard assist for last ~50 feet due to increased left LE instability.                    [facility] Adult PT Treatment/Exercise - 06/15/21 1023       Transfers   Transfers Sit to Stand;Stand to Sit    Sit to Stand 5: Supervision;Without upper extremity assist;From chair/3-in-1    Stand to Sit 5: Supervision;Without upper extremity assist    Number of Reps 2 sets;Other reps (comment)   5 reps   Comments with feet in stride stance with left foot back/right foot forward, light UE support at needed      Ambulation/Gait   Ambulation/Gait Yes    Ambulation/Gait Assistance 5: Supervision;4: Min guard    Ambulation/Gait Assistance Details around clinic with session. increased assistance needed as pt fatigues due to right foot scuffing/right LE instability in stance.    Assistive device None    Gait Pattern Decreased arm swing - right;Step-through pattern;Decreased stance time - right;Decreased hip/knee flexion - right;Decreased dorsiflexion - right;Poor foot clearance - right;Lateral trunk lean to right;Poor foot clearance - left    Ambulation Surface Level;Indoor      Knee/Hip Exercises: Seated   Other Seated Knee/Hip Exercises seated with red band around knees: hip fall outs x 10 reps each side with PTA hand as target; then marching with PTA hand as target to promote increased hip flexion for 10 reps each side. cues for slow, controlled movements with both ex's.                      PT Short Term Goals - 06/09/21 1409       PT SHORT TERM GOAL #1   Title Pt will decr TUG time to 14 seconds or less in order to demo decr fall risk. ALL STGS DUE 06/30/21    Baseline 15.93 seconds on 06/06/21    Time 3    Period Weeks    Status New    Target Date 06/30/21      PT SHORT TERM GOAL #2   Title Pt will undego 2-3 MWT with LTG to be written as appropriate with use of foot up brace to  determine improved gait endurance.    Baseline not yet assessed.    Time 3    Period Weeks    Status  New      PT SHORT TERM GOAL #3   Title Pt will improve gait speed to at least 2.2 ft/sec with no AD vs. LRAD in order to demo decr fall risk.    Baseline last assessed 1.83 ft/sec no AD    Time 3    Period Weeks    Status New      PT SHORT TERM GOAL #4   Title --    Baseline --    Time --    Period --    Status --               PT Long Term Goals - 06/09/21 1024       PT LONG TERM GOAL #1   Title Pt will be independent with final HEP in order to build upon functional gains made in therapy ALL LTGS DUE 06/13/21    Baseline --    Time 6    Period Weeks    Status New      PT LONG TERM GOAL #2   Title Pt will go ascend and descend 4 steps with single handrail vs. use of LRAD with supervision in order to be able to go to her son's house.    Baseline min guard needed on 06/09/21    Time 6    Period Weeks    Status On-going      PT LONG TERM GOAL #3   Title Pt will decr 5x sit <> stand time to 20 seconds or less in order to demo improved BLE strength and balance.    Baseline 31.47 seconds on 05/09/21 with no UE support; 23.75 seconds from standard height chair on 06/09/21    Time 6    Period Weeks    Status Revised      PT LONG TERM GOAL #4   Title 2-3 MWT goal to be written as appropriate in order to demo improved gait endurance.    Baseline --    Time 6    Period Weeks    Status New      PT LONG TERM GOAL #5   Title --    Baseline --    Time --    Period --    Status --      PT LONG TERM GOAL #6   Title Pt will improve FOTO score to at least 60% in order to demo improved functional outcomes.    Baseline 50%; 52.4% on 06/06/21    Time 6    Period Weeks    Status On-going                   Plan - 06/15/21 1023     Clinical Impression Statement Today's skilled session initially focused on setting baseline value for 3 minute walk test. Pt' BP  elevated with walking, then decreased with seated rest break. Remainder of session focused on LE strengthening with BP remaining lower. The pt is making progress toward goals and should benefit from continued PT to progress toward unmet goals.    Personal Factors and Comorbidities Comorbidity 3+;Past/Current Experience    Comorbidities smoking, PAD, HTN, ETOH abuse, chronic hepatitis C with hepatic coma,GAD, esophageal stricture    Examination-Activity Limitations Locomotion Level;Transfers;Stairs;Squat;Carry    Examination-Participation Restrictions Cleaning;Community Activity;Laundry    Stability/Clinical Decision Making Stable/Uncomplicated    Rehab Potential Good    PT Frequency 2x / week   12 visits over 8 weeks   PT Duration 8 weeks  12 visits over 8 weeks   PT Treatment/Interventions ADLs/Self Care Home Management;DME Instruction;Gait training;Stair training;Therapeutic activities;Functional mobility training;Therapeutic exercise;Balance training;Neuromuscular re-education;Orthotic Fit/Training;Patient/family education;Vestibular    PT Next Visit Plan did pt get foot up brace? (could use with either RLE/LLE). weight shifting activities for LLE. NuStep/SciFit for endurance. BLE strength. stair training. 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)  Muscle weakness  (generalized)     Problem List Patient Active Problem List   Diagnosis Date Noted   Acute CVA (cerebrovascular accident) ([place]) 12/29/2020   Peripheral arterial disease ([place]) 12/20/2020   History of esophageal stricture 07/08/2020   Insomnia 12/02/2017   Chronic cough 08/14/2017   Poor appetite 08/29/2016   Peptic stricture of esophagus    History of colonic polyps    Hiatal hernia 03/16/2016   Odynophagia 03/16/2016   Dysphagia 03/16/2016   Alcohol abuse 10/26/2015   Essential hypertension 09/26/2015   PAC (premature atrial contraction) 10/11/2014   PVC (premature ventricular contraction) 10/11/2014   Back muscle spasm 06/15/2014   Generalized anxiety disorder 06/15/2014   Smoking 06/15/2014   Chronic hepatitis C without hepatic coma ([place]) 05/28/2013    Willow Ora, PTA, [facility] [address], [place], [place] [address] [phone] 06/15/21, 4:47 PM   Name: Kara Bailey MRN: [id number] Date of Birth: 1956/03/24

## 2021-06-16 ENCOUNTER — Ambulatory Visit: Payer: Medicare HMO | Admitting: Occupational Therapy

## 2021-06-16 ENCOUNTER — Encounter: Payer: Self-pay | Admitting: Occupational Therapy

## 2021-06-16 DIAGNOSIS — R2681 Unsteadiness on feet: Secondary | ICD-10-CM

## 2021-06-16 DIAGNOSIS — M6281 Muscle weakness (generalized): Secondary | ICD-10-CM

## 2021-06-16 DIAGNOSIS — R29818 Other symptoms and signs involving the nervous system: Secondary | ICD-10-CM

## 2021-06-16 DIAGNOSIS — I69851 Hemiplegia and hemiparesis following other cerebrovascular disease affecting right dominant side: Secondary | ICD-10-CM

## 2021-06-16 DIAGNOSIS — R41840 Attention and concentration deficit: Secondary | ICD-10-CM

## 2021-06-16 DIAGNOSIS — R278 Other lack of coordination: Secondary | ICD-10-CM

## 2021-06-16 NOTE — Therapy (Signed)
[facility] [address] [place], [place], [address] Phone: [phone]   Fax:  [phone]  Occupational Therapy Treatment & Discharge  Patient Details  Name: Kara Bailey MRN: [id number] Date of Birth: [birth year] Referring Provider (OT): Cipriano Mile, NP   Encounter Date: 06/16/2021   OT End of Session - 06/16/21 0851     Visit Number 12    Number of Visits 17    Date for OT Re-Evaluation 06/16/21   renewal   Authorization Type [employer] Medicare/Medicaid    Authorization Time Period Covered 100%  Auth 8 visits (05/30/21 - 06/27/21)    Authorization - Visit Number 3    Authorization - Number of Visits 8    Progress Note Due on Visit 104    OT Start Time 0849    OT Stop Time 0928    OT Time Calculation (min) 39 min    Activity Tolerance Patient tolerated treatment well    Behavior During Therapy [facility] for tasks assessed/performed             Past Medical History:  Diagnosis Date   Anxiety    Chest pain 08/2014   unspecified   Cirrhosis ([place])    Depression    Dizziness 08/2014   Dyspnea 08/2014   Emphysema lung (HCC)    GERD (gastroesophageal reflux disease)    Heart murmur    Hepatitis C    Hiatal hernia    Hypertension    PAC (premature atrial contraction) 10/11/2014   PAD (peripheral artery disease) (HCC)    Palpitations    PVC (premature ventricular contraction) 10/11/2014   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   Stroke ([place])    just slurred speech   Tachycardia 08/2014   Ventral hernia    Weakness 08/2014    Past Surgical History:  Procedure Laterality Date   APPENDECTOMY     CATARACT EXTRACTION, BILATERAL     COLONOSCOPY     COLONOSCOPY WITH PROPOFOL N/A 04/30/2016   Procedure: COLONOSCOPY WITH PROPOFOL;  Surgeon: Daneil Dolin, MD;  [facility];  Service: Endoscopy;  Laterality: N/A;  1230   ESOPHAGOGASTRODUODENOSCOPY     approximately 2010    ESOPHAGOGASTRODUODENOSCOPY (EGD) WITH PROPOFOL N/A 04/30/2016   Procedure: ESOPHAGOGASTRODUODENOSCOPY (EGD) WITH PROPOFOL;  Surgeon: Daneil Dolin, MD;  [facility];  Service: Endoscopy;  Laterality: N/A;   LEFT HEART CATHETERIZATION WITH CORONARY ANGIOGRAM N/A 10/29/2014   07-14-20- pt denies this Procedure: LEFT HEART CATHETERIZATION WITH CORONARY ANGIOGRAM;  Surgeon: Burnell Blanks, MD;  Location: [facility] CATH LAB;  Service: Cardiovascular;  Laterality: N/A;   MALONEY DILATION N/A 04/30/2016   Procedure: Venia Minks DILATION;  Surgeon: Daneil Dolin, MD;  [facility];  Service: Endoscopy;  Laterality: N/A;   POLYPECTOMY  04/30/2016   Procedure: POLYPECTOMY;  Surgeon: Daneil Dolin, MD;  [facility];  Service: Endoscopy;;  Sigmoid colon polyp removed via hot snare   UPPER GASTROINTESTINAL ENDOSCOPY      There were no vitals filed for this visit.   Subjective Assessment - 06/16/21 0852     Subjective  "I'm doing good I am not dropping things as much"    Pertinent History PMH smoking, PAD, HTN, ETOH abuse, chronic hepatitis C with hepatic coma,GAD, esophageal stricture,  recent episode of possible hematemesis    Limitations Fall Risk    Patient Stated Goals "all of that is ok"    Currently in Pain? No/denies  OCCUPATIONAL THERAPY DISCHARGE SUMMARY  Visits from Start of Care: 12  Current functional level related to goals / functional outcomes: Pt has improved with overall strength and coordination in BUE. Pt improved with motor planning and completing multiple tasks simultaneously.    Remaining deficits: Pt continues to have word finding deficits and other cognitive deficits. Pt continues with some decreased coordination.   Education / Equipment: HEPs for grip strengthening and coordination   Patient agrees to discharge. Patient goals were partially met. Patient is being discharged due to being pleased with the current functional  level..        Functional reaching and grasp and grip strengthening with LUE with resistance clothespins 1-8#.    Physical/Cognitive with ABCs food category and tossing ball and ambulating around gym. Increased time required and mod cues for sequence of ABC order. Pt had difficulty with tossing ball in conjunction with ambulation as well.              OT Long Term Goals - 06/16/21 0853       OT LONG TERM GOAL #1   Title Pt will be independent with HEP for coordination and grip strength    Time 4    Period Weeks    Status Achieved      OT LONG TERM GOAL #2   Title Pt will improve grip strength, bilaterally, to a functional strength (35lbs or greater).    Baseline R 32.6, L 26.6    Time 4    Period Weeks    Status Partially Met   L 34.1, R 48. L 34.6 on 06/16/21     OT LONG TERM GOAL #3   Title Pt will perform physical and cognitive task simultaneously with 90% accuracy    Time 4    Period Weeks    Status Not Met   req'd mod cues for sequencing ABCs     OT LONG TERM GOAL #4   Title Pt will verbalize understanding of adapted strategies and/or equipment PRN to increase safety and independence with ADLs and IADLs (cutting vegetables, etc)    Time 4    Period Weeks    Status Achieved   pt reports not having difficulty with cutting vegetables anymore at home 04/19/21     OT LONG TERM GOAL #5   Title Pt will report decreased drops at home d/t increased coordination and attention to right side.    Time 5    Period Weeks    Status Achieved   pt reports decreased drops in LUE                  Plan - 06/16/21 0909     Clinical Impression Statement Pt is ready for discharge at this time.    OT Occupational Profile and History Problem Focused Assessment - Including review of records relating to presenting problem    Occupational performance deficits (Please refer to evaluation for details): ADL's;IADL's    Body Structure / Function / Physical Skills  ADL;FMC;Coordination;Decreased knowledge of use of DME;Dexterity;GMC;Strength;UE functional use;Vision;IADL    Cognitive Skills Attention;Sequencing;Safety Awareness;Problem Solve    Rehab Potential Good    Clinical Decision Making Limited treatment options, no task modification necessary    Comorbidities Affecting Occupational Performance: None    Modification or Assistance to Complete Evaluation  No modification of tasks or assist necessary to complete eval    OT Frequency 2x / week    OT Duration Other (comment)   2x/week for 4  more weeks at renewal - 5 weeks to allow for missed visits   OT Treatment/Interventions Self-care/ADL training;Neuromuscular education;Energy conservation;Functional Mobility Training;Patient/family education;Cognitive remediation/compensation;Therapeutic exercise;Therapeutic activities;DME and/or AE instruction;Moist Heat;Fluidtherapy;Visual/perceptual remediation/compensation    Plan OT d/c    Consulted and Agree with Plan of Care Patient             Patient will benefit from skilled therapeutic intervention in order to improve the following deficits and impairments:   Body Structure / Function / Physical Skills: ADL, FMC, Coordination, Decreased knowledge of use of DME, Dexterity, GMC, Strength, UE functional use, Vision, IADL Cognitive Skills: Attention, Sequencing, Safety Awareness, Problem Solve     Visit Diagnosis: Unsteadiness on feet  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)  Muscle weakness (generalized)  Other symptoms and signs involving the nervous system  Other lack of coordination  Attention and concentration deficit    Problem List Patient Active Problem List   Diagnosis Date Noted   Acute CVA (cerebrovascular accident) ([place]) 12/29/2020   Peripheral arterial disease ([place]) 12/20/2020   History of esophageal stricture 07/08/2020   Insomnia 12/02/2017   Chronic cough  08/14/2017   Poor appetite 08/29/2016   Peptic stricture of esophagus    History of colonic polyps    Hiatal hernia 03/16/2016   Odynophagia 03/16/2016   Dysphagia 03/16/2016   Alcohol abuse 10/26/2015   Essential hypertension 09/26/2015   PAC (premature atrial contraction) 10/11/2014   PVC (premature ventricular contraction) 10/11/2014   Back muscle spasm 06/15/2014   Generalized anxiety disorder 06/15/2014   Smoking 06/15/2014   Chronic hepatitis C without hepatic coma ([place]) 05/28/2013    Zachery Conch MOT, OTR/L  06/16/2021, 9:29 AM  [facility] [address] [place], [place], [address] Phone: [phone]   Fax:  [phone]  Name: SHONDREA STEINERT MRN: [id number] Date of Birth: 17-Mar-1956

## 2021-06-20 ENCOUNTER — Ambulatory Visit: Payer: Medicare HMO | Admitting: Physical Therapy

## 2021-06-23 ENCOUNTER — Encounter: Payer: Self-pay | Admitting: Physical Therapy

## 2021-06-23 ENCOUNTER — Ambulatory Visit: Payer: Medicare HMO

## 2021-06-23 ENCOUNTER — Other Ambulatory Visit: Payer: Self-pay

## 2021-06-23 ENCOUNTER — Ambulatory Visit: Payer: Medicare HMO | Admitting: Physical Therapy

## 2021-06-23 DIAGNOSIS — M6281 Muscle weakness (generalized): Secondary | ICD-10-CM

## 2021-06-23 DIAGNOSIS — I69851 Hemiplegia and hemiparesis following other cerebrovascular disease affecting right dominant side: Secondary | ICD-10-CM

## 2021-06-23 DIAGNOSIS — R4701 Aphasia: Secondary | ICD-10-CM

## 2021-06-23 DIAGNOSIS — R41841 Cognitive communication deficit: Secondary | ICD-10-CM

## 2021-06-23 DIAGNOSIS — R2681 Unsteadiness on feet: Secondary | ICD-10-CM

## 2021-06-23 DIAGNOSIS — R29818 Other symptoms and signs involving the nervous system: Secondary | ICD-10-CM

## 2021-06-23 DIAGNOSIS — R1310 Dysphagia, unspecified: Secondary | ICD-10-CM

## 2021-06-23 DIAGNOSIS — R471 Dysarthria and anarthria: Secondary | ICD-10-CM

## 2021-06-23 DIAGNOSIS — R482 Apraxia: Secondary | ICD-10-CM

## 2021-06-23 NOTE — Therapy (Signed)
[facility] [address] [place], [place], [address] Phone: [phone]   Fax:  [phone]  Speech Language Pathology Treatment  Patient Details  Name: Kara Bailey MRN: [id number] Date of Birth: 1956-11-06 Referring Provider (SLP): Cipriano Mile, NP   Encounter Date: 06/23/2021   End of Session - 06/23/21 1309     Visit Number 16    Number of Visits 31   re-cert for 8 more weeks for 16 more visits   Date for SLP Re-Evaluation 08/04/21    Authorization Type [employer]    SLP Start Time 0850    SLP Stop Time  0930    SLP Time Calculation (min) 40 min    Activity Tolerance Patient tolerated treatment well             Past Medical History:  Diagnosis Date   Anxiety    Chest pain 08/2014   unspecified   Cirrhosis ([place])    Depression    Dizziness 08/2014   Dyspnea 08/2014   Emphysema lung ([facility])    GERD (gastroesophageal reflux disease)    Heart murmur    Hepatitis C    Hiatal hernia    Hypertension    PAC (premature atrial contraction) 10/11/2014   PAD (peripheral artery disease) ([facility])    Palpitations    PVC (premature ventricular contraction) 10/11/2014   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   Stroke ([place])    just slurred speech   Tachycardia 08/2014   Ventral hernia    Weakness 08/2014    Past Surgical History:  Procedure Laterality Date   APPENDECTOMY     CATARACT EXTRACTION, BILATERAL     COLONOSCOPY     COLONOSCOPY WITH PROPOFOL N/A 04/30/2016   Procedure: COLONOSCOPY WITH PROPOFOL;  Surgeon: Daneil Dolin, MD;  [facility];  Service: Endoscopy;  Laterality: N/A;  1230   ESOPHAGOGASTRODUODENOSCOPY     approximately 2010   ESOPHAGOGASTRODUODENOSCOPY (EGD) WITH PROPOFOL N/A 04/30/2016   Procedure: ESOPHAGOGASTRODUODENOSCOPY (EGD) WITH PROPOFOL;  Surgeon: Daneil Dolin, MD;  [facility];  Service: Endoscopy;  Laterality: N/A;   LEFT HEART  CATHETERIZATION WITH CORONARY ANGIOGRAM N/A 10/29/2014   07-14-20- pt denies this Procedure: LEFT HEART CATHETERIZATION WITH CORONARY ANGIOGRAM;  Surgeon: Burnell Blanks, MD;  Location: [facility] CATH LAB;  Service: Cardiovascular;  Laterality: N/A;   MALONEY DILATION N/A 04/30/2016   Procedure: Venia Minks DILATION;  Surgeon: Daneil Dolin, MD;  [facility];  Service: Endoscopy;  Laterality: N/A;   POLYPECTOMY  04/30/2016   Procedure: POLYPECTOMY;  Surgeon: Daneil Dolin, MD;  [facility];  Service: Endoscopy;;  Sigmoid colon polyp removed via hot snare   UPPER GASTROINTESTINAL ENDOSCOPY      There were no vitals filed for this visit.          ADULT SLP TREATMENT - 06/23/21 0001       General Information   Behavior/Cognition Alert;Cooperative;Pleasant mood      Treatment Provided   Treatment provided Cognitive-Linquistic      Cognitive-Linquistic Treatment   Treatment focused on Apraxia;Dysarthria;Aphasia    Skilled Treatment SLP targeted abdominal breathing with pt - initial cues necessary but after approx 30 seconds pt stated "I gotta stop or I''m gonna fall asleep!" SLP told pt that the AB was working then as one thing it does is to assist in relaxation of her speech tract. SLP worked with pt with flow phonation (breathing and voicing  simultaneously) to release tension from pt's vocal tract and improve timing of speech musculature. Dannica needed mod cues usually with tissue to breath and voice simultaneously. Very little success - pt either breathed or voiced with /u/ and the tissue. Pt's vocal tension audibly present in pt's voice production throughout. Pt practiced words and SLP cued pt to hold onto /u/ in each word x2-3 as long as she was initially. Doing this pt's timing of speech improved slightly. Pt and SLP discussed how important it is for pt to reduce her rate of speech - pt has been reluctant to do this but she will attempt this over the weekend.       Assessment / Recommendations / Plan   Plan Continue with current plan of care      Progression Toward Goals   Progression toward goals Progressing toward goals              SLP Education - 06/23/21 1307     Education Details slowed rate is essential, flow phonation    Person(s) Educated Patient    Methods Explanation;Demonstration;Verbal cues    Comprehension Verbalized understanding;Returned demonstration;Tactile cues required;Need further instruction;Verbal cues required              SLP Short Term Goals - 06/09/21 1658       SLP SHORT TERM GOAL #1   Title Pt will demonstrate dysarthria compensations in 10 minute simple to mod complex conversation with min A over 2 sessions    Status Not Met      SLP SHORT TERM GOAL #2   Title Pt will use memory/attention compensations for appointments, medicine management, and other daily activities with occasional min A over 3 sessions.    Baseline 05-12-21    Status Partially Met      SLP SHORT TERM GOAL #3   Title Pt will ID and correct word finding errors with 80% accuracy given occasional min A over 2 sessions    Baseline 05-12-21    Status Partially Met      SLP SHORT TERM GOAL #4   Title Pt will verbalize 3 safety concerns in the home and generate solutions to them with occasional min A over 2 sessions    Baseline 05-12-21    Status Partially Met      SLP SHORT TERM GOAL #5   Title Pt will complete dysphagia HEP to optimize swallow function with occasional min A over 2 sessions    Status Not Met      Additional Short Term Goals   Additional Short Term Goals Yes      SLP SHORT TERM GOAL #6   Title Pt will exhibit improved oral coordination on targeted apraxia tasks with occasional min A over 2 sessions    Status Not Met      SLP SHORT TERM GOAL #7   Title Pt will generate abdominal breathing in 16/20 answers to SLP questions over 2 sessions    Time 4    Period Weeks    Status New    Target Date 07/07/21      SLP  SHORT TERM GOAL #8   Title Pt will be 90% intelligible in response to open ended SLP questions with rare min A over 2 sessions    Time 4    Period Weeks    Status New    Target Date 07/07/21              SLP Long Term Goals - 06/23/21  [place] #1   Title Pt will demonstrate dysarthria compensations in 10 minute simple to mod complex conversation with min A over 2 sessions    Time 8    Period Weeks    Status On-going    Target Date 08/04/21      SLP LONG TERM GOAL #2   Title Pt will use memory/attention compensations for appointments, medicine management, and other daily activities with occasional min A over 3 sessions.    Time 8    Period Weeks    Status On-going    Target Date 08/04/21      SLP LONG TERM GOAL #3   Title Pt will ID and correct word finding errors with 95% accuracy given occasional min A over 2 sessions    Status Partially Met      SLP LONG TERM GOAL #4   Title Pt will report improved diet tolerance and reduced s/sx of aspiration with rare min over 4 sessions    Baseline 06-09-21    Time 8    Period Weeks    Status On-going    Target Date 08/04/21      SLP LONG TERM GOAL #5   Title Pt will undergo repeat objective swallow study as needed if s/sx of aspiration persist or change in respiration indicated    Time 8    Period Weeks    Status On-going    Target Date 08/04/21      SLP LONG TERM GOAL #6   Title Pt will generate abdominal breathing throughout 10 minute conversation occasional min A over 2 sessions    Time 8    Period Weeks    Status On-going    Target Date 08/04/21              Plan - 06/23/21 1311     Clinical Impression Statement Kymberlee cont to present with changes in speech, cognitive communication, and swallowing s/p CVA in February 2022. Ongoing education and training provided to maximize and coordinate breath support while speaking to address verbal apraxia and dysarthria. Slow progress exhibited to  utilize abdominal breathing to maximize breath support. Usual fading to occasional cues to reduce laryngel tension and optimize speaking on exhalation. Intermittent dysarthria exhibited impacting speech intelligibility. Occasional word finding exhibited, which appears to also impact fluency of speech. Pt continues to intermittent c/o swallowing difficulty, with some improvements using compensatory strategies (head turn, small bites, slow rate). Pt endorsed difficulty with mixed consistency today. SLP recommended continuation of ST intervention as pt has not yet met max rehab potential. Skilled ST is recommended to address speech, cognitive communication, and dysphagia impacting pt's overall safety and functional independence compared to baseline.    Speech Therapy Frequency 2x / week    Duration 8 weeks   re-cert for 2x/week for 8 more weeks   Treatment/Interventions Aspiration precaution training;Oral motor exercises;Compensatory strategies;Pharyngeal strengthening exercises;Functional tasks;Patient/family education;Diet toleration management by SLP;Cognitive reorganization;Multimodal communcation approach;Language facilitation;Compensatory techniques;Internal/external aids;SLP instruction and feedback    Potential to Achieve Goals Fair    Potential Considerations Ability to learn/carryover information;Severity of impairments    SLP Home Exercise Plan provided    Consulted and Agree with Plan of Care Patient             Patient will benefit from skilled therapeutic intervention in order to improve the following deficits and impairments:   Verbal apraxia  Dysarthria and anarthria  Aphasia  Cognitive communication deficit  Dysphagia, unspecified type    Problem List Patient Active Problem List   Diagnosis Date Noted   Acute CVA (cerebrovascular accident) ([place]) 12/29/2020   Peripheral arterial disease ([place]) 12/20/2020   History of esophageal stricture 07/08/2020   Insomnia 12/02/2017    Chronic cough 08/14/2017   Poor appetite 08/29/2016   Peptic stricture of esophagus    History of colonic polyps    Hiatal hernia 03/16/2016   Odynophagia 03/16/2016   Dysphagia 03/16/2016   Alcohol abuse 10/26/2015   Essential hypertension 09/26/2015   PAC (premature atrial contraction) 10/11/2014   PVC (premature ventricular contraction) 10/11/2014   Back muscle spasm 06/15/2014   Generalized anxiety disorder 06/15/2014   Smoking 06/15/2014   Chronic hepatitis C without hepatic coma ([place]) 05/28/2013    Sylvio Weatherall ,[place], CCC-SLP  06/23/2021, 1:13 PM  [facility] [address] [place], [place], [address] Phone: [phone]   Fax:  [phone]   Name: KRISTYN OBYRNE MRN: [id number] Date of Birth: 02-27-1956

## 2021-06-23 NOTE — Therapy (Signed)
[facility] [address] [place], [place], [address] Phone: [phone]   Fax:  [phone]  Physical Therapy Treatment  Patient Details  Name: Kara Bailey MRN: [id number] Date of Birth: February 21, 1956 Referring Provider (PT): Cipriano Mile, NP   Encounter Date: 06/23/2021   PT End of Session - 06/23/21 0805     Visit Number 16    Number of Visits 26    Date for PT Re-Evaluation 08/08/21    Authorization Type [employer] -    Authorization Time Period 16 visits from 67/22- 07/17/21.    Authorization - Visit Number 15    Authorization - Number of Visits 16    Progress Note Due on Visit 93    PT Start Time 0803    PT Stop Time [id number]    PT Time Calculation (min) 41 min    Equipment Utilized During Treatment Gait belt    Activity Tolerance Patient tolerated treatment well    Behavior During Therapy [facility] for tasks assessed/performed             Past Medical History:  Diagnosis Date   Anxiety    Chest pain 08/2014   unspecified   Cirrhosis ([place])    Depression    Dizziness 08/2014   Dyspnea 08/2014   Emphysema lung (HCC)    GERD (gastroesophageal reflux disease)    Heart murmur    Hepatitis C    Hiatal hernia    Hypertension    PAC (premature atrial contraction) 10/11/2014   PAD (peripheral artery disease) (HCC)    Palpitations    PVC (premature ventricular contraction) 10/11/2014   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   Stroke ([place])    just slurred speech   Tachycardia 08/2014   Ventral hernia    Weakness 08/2014    Past Surgical History:  Procedure Laterality Date   APPENDECTOMY     CATARACT EXTRACTION, BILATERAL     COLONOSCOPY     COLONOSCOPY WITH PROPOFOL N/A 04/30/2016   Procedure: COLONOSCOPY WITH PROPOFOL;  Surgeon: Daneil Dolin, MD;  [facility];  Service: Endoscopy;  Laterality: N/A;  1230   ESOPHAGOGASTRODUODENOSCOPY     approximately 2010    ESOPHAGOGASTRODUODENOSCOPY (EGD) WITH PROPOFOL N/A 04/30/2016   Procedure: ESOPHAGOGASTRODUODENOSCOPY (EGD) WITH PROPOFOL;  Surgeon: Daneil Dolin, MD;  [facility];  Service: Endoscopy;  Laterality: N/A;   LEFT HEART CATHETERIZATION WITH CORONARY ANGIOGRAM N/A 10/29/2014   07-14-20- pt denies this Procedure: LEFT HEART CATHETERIZATION WITH CORONARY ANGIOGRAM;  Surgeon: Burnell Blanks, MD;  Location: [facility] CATH LAB;  Service: Cardiovascular;  Laterality: N/A;   MALONEY DILATION N/A 04/30/2016   Procedure: Venia Minks DILATION;  Surgeon: Daneil Dolin, MD;  [facility];  Service: Endoscopy;  Laterality: N/A;   POLYPECTOMY  04/30/2016   Procedure: POLYPECTOMY;  Surgeon: Daneil Dolin, MD;  [facility];  Service: Endoscopy;;  Sigmoid colon polyp removed via hot snare   UPPER GASTROINTESTINAL ENDOSCOPY      There were no vitals filed for this visit.   Subjective Assessment - 06/23/21 0804     Subjective No new complaints. Tried to order brace, did something wrong. Plans to get her grandson to help her this weekend.    Pertinent History smoking, PAD, HTN, ETOH abuse, chronic hepatitis C with hepatic coma,GAD, esophageal stricture    Limitations Walking;Standing    Patient Stated Goals wants to work on walking normally  Currently in Pain? No/denies    Pain Score 0-No pain                    [facility] Adult PT Treatment/Exercise - 06/23/21 0806       Transfers   Transfers Sit to Stand;Stand to Sit    Sit to Stand 5: Supervision;Without upper extremity assist;From chair/3-in-1    Stand to Sit 5: Supervision;Without upper extremity assist      Ambulation/Gait   Ambulation/Gait Yes    Ambulation/Gait Assistance 5: Supervision;4: Min guard    Ambulation/Gait Assistance Details around clinic with session. min guard assist for safety as pt fatigues with activity.    Assistive device None    Gait Pattern Decreased arm swing - right;Step-through  pattern;Decreased stance time - right;Decreased hip/knee flexion - right;Decreased dorsiflexion - right;Poor foot clearance - right;Lateral trunk lean to right;Poor foot clearance - left    Ambulation Surface Level;Indoor      High Level Balance   High Level Balance Activities Side stepping;Marching forwards;Marching backwards;Tandem walking   tandem walking forward/backward   High Level Balance Comments in parallel bars with UE support for 3 laps each with cues on form and technique, min guard assist for safety.      Knee/Hip Exercises: Aerobic   Stepper Seated SciFit Stepper with BLE/BUE level 1.5 x 8 mintues with goal >/= 50 steps per minute for strengthening and activity tolerance      Knee/Hip Exercises: Standing   Lateral Step Up --    Forward Step Up Both;1 set;5 reps;Hand Hold: 2;Step Height: 6";Limitations    Forward Step Up Limitations alternating step ups with contralateral march with min guard assist for balance/safety, cues on form with increased time needed with lifitng LE's up      Knee/Hip Exercises: Seated   Other Seated Knee/Hip Exercises seated with red band around knees: hip fall outs x 10 reps each side with PTA hand as target; then marching with PTA hand as target to promote increased hip flexion for 10 reps each side. cues for slow, controlled movements with both ex's.                  PT Short Term Goals - 06/09/21 1409       PT SHORT TERM GOAL #1   Title Pt will decr TUG time to 14 seconds or less in order to demo decr fall risk. ALL STGS DUE 06/30/21    Baseline 15.93 seconds on 06/06/21    Time 3    Period Weeks    Status New    Target Date 06/30/21      PT SHORT TERM GOAL #2   Title Pt will undego 2-3 MWT with LTG to be written as appropriate with use of foot up brace to determine improved gait endurance.    Baseline not yet assessed.    Time 3    Period Weeks    Status New      PT SHORT TERM GOAL #3   Title Pt will improve gait speed to at  least 2.2 ft/sec with no AD vs. LRAD in order to demo decr fall risk.    Baseline last assessed 1.83 ft/sec no AD    Time 3    Period Weeks    Status New      PT SHORT TERM GOAL #4   Title --    Baseline --    Time --    Period --    Status --  PT Long Term Goals - 06/09/21 1024       PT LONG TERM GOAL #1   Title Pt will be independent with final HEP in order to build upon functional gains made in therapy ALL LTGS DUE 06/13/21    Baseline --    Time 6    Period Weeks    Status New      PT LONG TERM GOAL #2   Title Pt will go ascend and descend 4 steps with single handrail vs. use of LRAD with supervision in order to be able to go to her son's house.    Baseline min guard needed on 06/09/21    Time 6    Period Weeks    Status On-going      PT LONG TERM GOAL #3   Title Pt will decr 5x sit <> stand time to 20 seconds or less in order to demo improved BLE strength and balance.    Baseline 31.47 seconds on 05/09/21 with no UE support; 23.75 seconds from standard height chair on 06/09/21    Time 6    Period Weeks    Status Revised      PT LONG TERM GOAL #4   Title 2-3 MWT goal to be written as appropriate in order to demo improved gait endurance.    Baseline --    Time 6    Period Weeks    Status New      PT LONG TERM GOAL #5   Title --    Baseline --    Time --    Period --    Status --      PT LONG TERM GOAL #6   Title Pt will improve FOTO score to at least 60% in order to demo improved functional outcomes.    Baseline 50%; 52.4% on 06/06/21    Time 6    Period Weeks    Status On-going                   Plan - 06/23/21 0805     Clinical Impression Statement Today's skilled session continued to focus on strengthening and balance. Increased assistance needed with gait in gym as pt fatigued due to veering and LE instability, no buckling noted, just muscle tremors. The pt is making steady progress toward goals and should benefit from  continued PT to progress toward unmet goals.    Personal Factors and Comorbidities Comorbidity 3+;Past/Current Experience    Comorbidities smoking, PAD, HTN, ETOH abuse, chronic hepatitis C with hepatic coma,GAD, esophageal stricture    Examination-Activity Limitations Locomotion Level;Transfers;Stairs;Squat;Carry    Examination-Participation Restrictions Cleaning;Community Activity;Laundry    Stability/Clinical Decision Making Stable/Uncomplicated    Rehab Potential Good    PT Frequency 2x / week   12 visits over 8 weeks   PT Duration 8 weeks   12 visits over 8 weeks   PT Treatment/Interventions ADLs/Self Care Home Management;DME Instruction;Gait training;Stair training;Therapeutic activities;Functional mobility training;Therapeutic exercise;Balance training;Neuromuscular re-education;Orthotic Fit/Training;Patient/family education;Vestibular    PT Next Visit Plan did pt get foot up brace? (could use with either RLE/LLE). weight shifting activities for LLE. NuStep/SciFit for endurance. BLE strength. stair training. sit <> Stands    PT Home Exercise Plan [id number]    Consulted and Agree with Plan of Care Patient             Patient will benefit from skilled therapeutic intervention in order to improve the following deficits and impairments:  Abnormal gait, Decreased activity tolerance, Decreased  balance, Decreased coordination, Decreased endurance, Decreased knowledge of use of DME, Difficulty walking, Decreased strength, Dizziness, Impaired sensation, Postural dysfunction  Visit Diagnosis: Unsteadiness on feet  Muscle weakness (generalized)  Hemiplegia and hemiparesis following other cerebrovascular disease affecting right dominant side ([place])  Other symptoms and signs involving the nervous system     Problem List Patient Active Problem List   Diagnosis Date Noted   Acute CVA (cerebrovascular accident) ([place]) 12/29/2020   Peripheral arterial disease ([place]) 12/20/2020   History of  esophageal stricture 07/08/2020   Insomnia 12/02/2017   Chronic cough 08/14/2017   Poor appetite 08/29/2016   Peptic stricture of esophagus    History of colonic polyps    Hiatal hernia 03/16/2016   Odynophagia 03/16/2016   Dysphagia 03/16/2016   Alcohol abuse 10/26/2015   Essential hypertension 09/26/2015   PAC (premature atrial contraction) 10/11/2014   PVC (premature ventricular contraction) 10/11/2014   Back muscle spasm 06/15/2014   Generalized anxiety disorder 06/15/2014   Smoking 06/15/2014   Chronic hepatitis C without hepatic coma ([place]) 05/28/2013   Willow Ora, PTA, [facility] [address] [place], [place] [address] [phone] 06/23/21, 8:47 AM   Name: MYEASHA HOOPES MRN: [id number] Date of Birth: 03/05/1956

## 2021-06-28 ENCOUNTER — Telehealth: Payer: Self-pay | Admitting: Cardiovascular Disease

## 2021-06-28 ENCOUNTER — Encounter: Payer: Self-pay | Admitting: Physical Therapy

## 2021-06-28 ENCOUNTER — Ambulatory Visit: Payer: Medicare HMO | Admitting: Physical Therapy

## 2021-06-28 ENCOUNTER — Other Ambulatory Visit: Payer: Self-pay

## 2021-06-28 ENCOUNTER — Ambulatory Visit: Payer: Medicare HMO

## 2021-06-28 DIAGNOSIS — R2681 Unsteadiness on feet: Secondary | ICD-10-CM | POA: Diagnosis not present

## 2021-06-28 DIAGNOSIS — R471 Dysarthria and anarthria: Secondary | ICD-10-CM

## 2021-06-28 DIAGNOSIS — R482 Apraxia: Secondary | ICD-10-CM

## 2021-06-28 DIAGNOSIS — R29818 Other symptoms and signs involving the nervous system: Secondary | ICD-10-CM

## 2021-06-28 DIAGNOSIS — I69851 Hemiplegia and hemiparesis following other cerebrovascular disease affecting right dominant side: Secondary | ICD-10-CM

## 2021-06-28 NOTE — Telephone Encounter (Signed)
Left message to call back  

## 2021-06-28 NOTE — Telephone Encounter (Signed)
Patient is returning call.  °

## 2021-06-28 NOTE — Patient Instructions (Signed)
What to Do Instead of Clearing Your Throat Use these strategies instead of clearing your throat. 1. Swallow your saliva. Swallow as hard as you can.  2. Take a drink of water. 3. Suck on ice chips. 4. Use a silent cough. Whisper the word "huh" from your belly without making a sound and then swallow. This is like a cough but without using your voice. 5. Hum on an "M" and then swallow. 6. Use a light, gentle cough (like tapping your vocal folds together) and then swallow. 7. Silently count to 10 and then swallow  

## 2021-06-28 NOTE — Therapy (Signed)
[facility] [address] [place], [place], [address] Phone: [phone]   Fax:  [phone]  Speech Language Pathology Treatment  Patient Details  Name: Kara Bailey MRN: [id number] Date of Birth: July 04, 1956 Referring Provider (SLP): Cipriano Mile, NP   Encounter Date: 06/28/2021   End of Session - 06/28/21 0958     Visit Number 17    Number of Visits 31    Date for SLP Re-Evaluation 08/04/21    Authorization Type [employer]    Authorization Time Period 16 visits 06-09-21 to 08-04-21    Authorization - Visit Number 2    Authorization - Number of Visits 16    SLP Start Time 1015    SLP Stop Time  1100    SLP Time Calculation (min) 45 min    Activity Tolerance Patient tolerated treatment well             Past Medical History:  Diagnosis Date   Anxiety    Chest pain 08/2014   unspecified   Cirrhosis ([place])    Depression    Dizziness 08/2014   Dyspnea 08/2014   Emphysema lung (H[facility])    GERD (gastroesophageal reflux disease)    Heart murmur    Hepatitis C    Hiatal hernia    Hypertension    PAC (premature atrial contraction) 10/11/2014   PAD (peripheral artery disease) (HCC)    Palpitations    PVC (premature ventricular contraction) 10/11/2014   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   Stroke ([place])    just slurred speech   Tachycardia 08/2014   Ventral hernia    Weakness 08/2014    Past Surgical History:  Procedure Laterality Date   APPENDECTOMY     CATARACT EXTRACTION, BILATERAL     COLONOSCOPY     COLONOSCOPY WITH PROPOFOL N/A 04/30/2016   Procedure: COLONOSCOPY WITH PROPOFOL;  Surgeon: Daneil Dolin, MD;  [facility];  Service: Endoscopy;  Laterality: N/A;  1230   ESOPHAGOGASTRODUODENOSCOPY     approximately 2010   ESOPHAGOGASTRODUODENOSCOPY (EGD) WITH PROPOFOL N/A 04/30/2016   Procedure: ESOPHAGOGASTRODUODENOSCOPY (EGD) WITH PROPOFOL;  Surgeon: Daneil Dolin,  MD;  [facility];  Service: Endoscopy;  Laterality: N/A;   LEFT HEART CATHETERIZATION WITH CORONARY ANGIOGRAM N/A 10/29/2014   07-14-20- pt denies this Procedure: LEFT HEART CATHETERIZATION WITH CORONARY ANGIOGRAM;  Surgeon: Burnell Blanks, MD;  Location: [facility] CATH LAB;  Service: Cardiovascular;  Laterality: N/A;   MALONEY DILATION N/A 04/30/2016   Procedure: Venia Minks DILATION;  Surgeon: Daneil Dolin, MD;  [facility];  Service: Endoscopy;  Laterality: N/A;   POLYPECTOMY  04/30/2016   Procedure: POLYPECTOMY;  Surgeon: Daneil Dolin, MD;  [facility];  Service: Endoscopy;;  Sigmoid colon polyp removed via hot snare   UPPER GASTROINTESTINAL ENDOSCOPY      There were no vitals filed for this visit.   Subjective Assessment - 06/28/21 1013     Subjective "they stretched it" re: reported espohageal dilation (unable to locate documents)    Currently in Pain? No/denies                   ADULT SLP TREATMENT - 06/28/21 0957       General Information   Behavior/Cognition Alert;Cooperative;Pleasant mood      Treatment Provided   Treatment provided Cognitive-Linquistic      Cognitive-Linquistic Treatment   Treatment focused on Apraxia;Dysarthria;Aphasia    Skilled Treatment Pt  reports recent esophageal dilation but SLP unable to locate documentation. Pt reports benefit for swallow function. SLP reviewed abdominal breathing and flow phonation to optimize coordination of breath support and reduce laryngeal tension. SLP trialed sniff-sniff blow to target diaphragmatic breathing, which was larely ineffective. SLP alternated via flow phonation and straw phonation in/out water to relax vocal tract, which was reportedly effective. Pt c/o sensation of thick phlegm, in which pt reports she is rarely able to clear with use of throat clears and coughs. SLP provided education and handout re: throat clear alternatives. SLP recommended pt focus on using short  sentences and more frequent breaths during speaking. SLP provided usual to intermittent min verbal and visual cues to slow rate of speech and pause to breathe. Pt reports she received ST as child to address fast rate of speech, which had not been mentioned prior.      Assessment / Recommendations / Plan   Plan Continue with current plan of care      Progression Toward Goals   Progression toward goals Progressing toward goals              SLP Education - 06/28/21 1043     Education Details throat clear alternatives, slow rate, pausing to breathe    Person(s) Educated Patient    Methods Explanation;Demonstration;Handout    Comprehension Verbalized understanding;Returned demonstration;Need further instruction              SLP Short Term Goals - 06/28/21 0959       SLP SHORT TERM GOAL #1   Title Pt will demonstrate dysarthria compensations in 10 minute simple to mod complex conversation with min A over 2 sessions    Status Not Met      SLP SHORT TERM GOAL #2   Title Pt will use memory/attention compensations for appointments, medicine management, and other daily activities with occasional min A over 3 sessions.    Baseline 05-12-21    Status Partially Met      SLP SHORT TERM GOAL #3   Title Pt will ID and correct word finding errors with 80% accuracy given occasional min A over 2 sessions    Baseline 05-12-21    Status Partially Met      SLP SHORT TERM GOAL #4   Title Pt will verbalize 3 safety concerns in the home and generate solutions to them with occasional min A over 2 sessions    Baseline 05-12-21    Status Partially Met      SLP SHORT TERM GOAL #5   Title Pt will complete dysphagia HEP to optimize swallow function with occasional min A over 2 sessions    Status Not Met      SLP SHORT TERM GOAL #6   Title Pt will exhibit improved oral coordination on targeted apraxia tasks with occasional min A over 2 sessions    Status Not Met      SLP SHORT TERM GOAL #7   Title  Pt will generate abdominal breathing in 16/20 answers to SLP questions over 2 sessions    Time 4    Period Weeks    Status On-going    Target Date 07/07/21      SLP SHORT TERM GOAL #8   Title Pt will be 90% intelligible in response to open ended SLP questions with rare min A over 2 sessions    Time 4    Period Weeks    Status On-going    Target Date 07/07/21  SLP Long Term Goals - 06/28/21 1000       SLP LONG TERM GOAL #1   Title Pt will demonstrate dysarthria compensations in 10 minute simple to mod complex conversation with min A over 2 sessions    Time 8    Period Weeks    Status On-going    Target Date 08/04/21      SLP LONG TERM GOAL #2   Title Pt will use memory/attention compensations for appointments, medicine management, and other daily activities with occasional min A over 3 sessions.    Time 8    Period Weeks    Status On-going    Target Date 08/04/21      SLP LONG TERM GOAL #3   Title Pt will ID and correct word finding errors with 95% accuracy given occasional min A over 2 sessions    Status Partially Met      SLP LONG TERM GOAL #4   Title Pt will report improved diet tolerance and reduced s/sx of aspiration with rare min over 4 sessions    Baseline 06-09-21    Time 8    Period Weeks    Status On-going    Target Date 08/04/21      SLP LONG TERM GOAL #5   Title Pt will undergo repeat objective swallow study as needed if s/sx of aspiration persist or change in respiration indicated    Time 8    Period Weeks    Status On-going    Target Date 08/04/21      SLP LONG TERM GOAL #6   Title Pt will generate abdominal breathing throughout 10 minute conversation occasional min A over 2 sessions    Time 8    Period Weeks    Status On-going    Target Date 08/04/21              Plan - 06/28/21 1001     Clinical Impression Statement Kara Bailey cont to present with changes in speech, cognitive communication, and swallowing s/p CVA in February  2022. Ongoing education and training provided to maximize and coordinate breath support to address verbal apraxia and dysarthria. Slow progress exhibited to utilize abdominal breathing and flow phonation to maximize breath support and reduce laryngeal tension. Usual fading to occasional cues to use slow rate and pause to breathe during speech tasks.  Skilled ST is recommended to address speech, cognitive communication, and dysphagia impacting pt's overall safety and functional independence compared to baseline.    Speech Therapy Frequency 2x / week    Duration 8 weeks    Treatment/Interventions Aspiration precaution training;Oral motor exercises;Compensatory strategies;Pharyngeal strengthening exercises;Functional tasks;Patient/family education;Diet toleration management by SLP;Cognitive reorganization;Multimodal communcation approach;Language facilitation;Compensatory techniques;Internal/external aids;SLP instruction and feedback    Potential to Achieve Goals Fair    Potential Considerations Ability to learn/carryover information;Severity of impairments    SLP Home Exercise Plan provided    Consulted and Agree with Plan of Care Patient             Patient will benefit from skilled therapeutic intervention in order to improve the following deficits and impairments:   Verbal apraxia  Dysarthria and anarthria    Problem List Patient Active Problem List   Diagnosis Date Noted   Acute CVA (cerebrovascular accident) ([place]) 12/29/2020   Peripheral arterial disease ([place]) 12/20/2020   History of esophageal stricture 07/08/2020   Insomnia 12/02/2017   Chronic cough 08/14/2017   Poor appetite 08/29/2016   Peptic stricture of esophagus  History of colonic polyps    Hiatal hernia 03/16/2016   Odynophagia 03/16/2016   Dysphagia 03/16/2016   Alcohol abuse 10/26/2015   Essential hypertension 09/26/2015   PAC (premature atrial contraction) 10/11/2014   PVC (premature ventricular contraction)  10/11/2014   Back muscle spasm 06/15/2014   Generalized anxiety disorder 06/15/2014   Smoking 06/15/2014   Chronic hepatitis C without hepatic coma ([place]) 05/28/2013    Alinda Deem, MA CCC-SLP 06/28/2021, 12:02 PM  [facility] [address] [place], [place], [address] Phone: [phone]   Fax:  [phone]   Name: Kara Bailey MRN: [id number] Date of Birth: February 24, 1956

## 2021-06-28 NOTE — Therapy (Addendum)
[facility] [address] [place], [place], [address] Phone: [phone]   Fax:  [phone]  Physical Therapy Treatment  Patient Details  Name: Kara Bailey MRN: [id number] Date of Birth: [birth year] Referring Provider (PT): Cipriano Mile, NP   Encounter Date: 06/28/2021   PT End of Session - 06/28/21 1014     Visit Number 17    Number of Visits 26    Date for PT Re-Evaluation 08/08/21    Authorization Type [employer] -    Authorization Time Period 16 visits from 67/22- 07/17/21, 12 visits through 06/09/21 - 08/08/21    Authorization - Visit Number 24    Authorization - Number of Visits 16    Progress Note Due on Visit 20    PT Start Time 0932    PT Stop Time 1013    PT Time Calculation (min) 41 min    Equipment Utilized During Treatment Gait belt    Activity Tolerance Patient tolerated treatment well    Behavior During Therapy 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   Stroke ([place])    just slurred speech   Tachycardia 08/2014   Ventral hernia    Weakness 08/2014    Past Surgical History:  Procedure Laterality Date   APPENDECTOMY     CATARACT EXTRACTION, BILATERAL     COLONOSCOPY     COLONOSCOPY WITH PROPOFOL N/A 04/30/2016   Procedure: COLONOSCOPY WITH PROPOFOL;  Surgeon: Daneil Dolin, MD;  [facility];  Service: Endoscopy;  Laterality: N/A;  1230   ESOPHAGOGASTRODUODENOSCOPY      approximately 2010   ESOPHAGOGASTRODUODENOSCOPY (EGD) WITH PROPOFOL N/A 04/30/2016   Procedure: ESOPHAGOGASTRODUODENOSCOPY (EGD) WITH PROPOFOL;  Surgeon: Daneil Dolin, MD;  [facility];  Service: Endoscopy;  Laterality: N/A;   LEFT HEART CATHETERIZATION WITH CORONARY ANGIOGRAM N/A 10/29/2014   07-14-20- pt denies this Procedure: LEFT HEART CATHETERIZATION WITH CORONARY ANGIOGRAM;  Surgeon: Burnell Blanks, MD;  Location[facility] CATH LAB;  Service: Cardiovascular;  Laterality: N/A;   MALONEY DILATION N/A 04/30/2016   Procedure: Venia Minks DILATION;  Surgeon: Daneil Dolin, MD;  [facility];  Service: Endoscopy;  Laterality: N/A;   POLYPECTOMY  04/30/2016   Procedure: POLYPECTOMY;  Surgeon: Daneil Dolin, MD;  [facility];  Service: Endoscopy;;  Sigmoid colon polyp removed via hot snare   UPPER GASTROINTESTINAL ENDOSCOPY      There were no vitals filed for this visit.   Subjective Assessment - 06/28/21 0936     Subjective No changes, no pain. No falls.    Pertinent History smoking, PAD, HTN, ETOH abuse, chronic hepatitis C with hepatic coma,GAD, esophageal stricture    Limitations Walking;Standing    Patient Stated Goals wants to work on walking normally    Currently in Pain? No/denies  [facility] PT Assessment - 06/28/21 0001       Timed Up and Go Test   Normal TUG (seconds) 13.25   no AD                          [facility] Adult PT Treatment/Exercise - 06/28/21 0939       Ambulation/Gait   Ambulation/Gait Yes    Ambulation/Gait Assistance 5: Supervision;4: Min guard    Ambulation/Gait Assistance Details cues for heel strike throughout esp with RLE, min guard at end of session due to incr fatigue and difficulties with RLE foot clearance and circumduction observed    Ambulation Distance (Feet) 115 Feet    Assistive device None    Gait Pattern Decreased arm swing - right;Step-through pattern;Decreased stance time -  right;Decreased hip/knee flexion - right;Decreased dorsiflexion - right;Poor foot clearance - right;Lateral trunk lean to right;Poor foot clearance - left    Ambulation Surface Level;Indoor    Gait velocity 12.63 seconds = 2.59 ft/sec      Knee/Hip Exercises: Aerobic   Stepper Nustep at gear 1 with BLE/BUE for strengthening, ROM, activity tolerance      Knee/Hip Exercises: Seated   Other Seated Knee/Hip Exercises seated with red band around thighs: marching with PT hand as target to promote increased hip flexion for 10 reps each side                 Balance Exercises - 06/28/21 0957       Balance Exercises: Standing   Rockerboard Anterior/posterior;EO    Rockerboard Limitations weight shifting A/P with no UE support x10 reps, holding board steady x10 reps head turns, x10 reps head nods. with BUE support: keeping LLE as stance leg on board stepping RLE off and on for weight shifting x5 reps, repeated with RLE as stance leg with therapist helping to assist with knee flexion to step on with LLE. in M/L direction: weight shifting x10 reps B with therapist assisting at hips with 3-5 second hold towards R/L, keeping board steady 3 x 30 seconds with intermittent taps to the bars for balance               PT Education - 06/28/21 1013     Education Details progress towards goals    Person(s) Educated Patient    Methods Explanation    Comprehension Verbalized understanding              PT Short Term Goals - 06/28/21 0935       PT SHORT TERM GOAL #1   Title Pt will decr TUG time to 14 seconds or less in order to demo decr fall risk. ALL STGS DUE 06/30/21    Baseline 15.93 seconds on 06/06/21, 13.25 seconds on 06/28/21    Time 3    Period Weeks    Status Achieved    Target Date 06/30/21      PT SHORT TERM GOAL #2   Title Pt will undego 2-3 MWT with LTG to be written as appropriate with use of foot up brace to determine improved gait endurance.    Baseline 3MWT -  360' on  06/15/21 with LTG written.    Time 3    Period Weeks    Status Achieved      PT SHORT TERM GOAL #3   Title Pt will improve gait speed to at least 2.2 ft/sec with no AD vs. LRAD in order to demo decr fall risk.  Baseline last assessed 1.83 ft/sec no AD; 12.63 seconds = 2.59 ft/sec on 06/28/21    Time 3    Period Weeks    Status Achieved               PT Long Term Goals - 06/09/21 1024       PT LONG TERM GOAL #1   Title Pt will be independent with final HEP in order to build upon functional gains made in therapy ALL LTGS DUE 06/13/21    Baseline --    Time 6    Period Weeks    Status New      PT LONG TERM GOAL #2   Title Pt will go ascend and descend 4 steps with single handrail vs. use of LRAD with supervision in order to be able to go to her son's house.    Baseline min guard needed on 06/09/21    Time 6    Period Weeks    Status On-going      PT LONG TERM GOAL #3   Title Pt will decr 5x sit <> stand time to 20 seconds or less in order to demo improved BLE strength and balance.    Baseline 31.47 seconds on 05/09/21 with no UE support; 23.75 seconds from standard height chair on 06/09/21    Time 6    Period Weeks    Status Revised      PT LONG TERM GOAL #4   Title 2-3 MWT goal to be written as appropriate in order to demo improved gait endurance.    Baseline --    Time 6    Period Weeks    Status New      PT LONG TERM GOAL #5   Title --    Baseline --    Time --    Period --    Status --      PT LONG TERM GOAL #6   Title Pt will improve FOTO score to at least 60% in order to demo improved functional outcomes.    Baseline 50%; 52.4% on 06/06/21    Time 6    Period Weeks    Status On-going                   Plan - 06/28/21 1205     Clinical Impression Statement Checked pt's STGs today. Pt met STG #1 and #3. Pt with improvement of gait speed today at 2.59 ft/sec (previously was 1.83 ft/sec). Pt with decr RLE foot clearance towards end of session when  more fatigued.  Pt improved TUG to 13.25 seconds (previously was 15.93 seconds), decr pt's risk for falls. Remainder of session focused on standing balance on unlevel surfaces and BLE strengthening. Needing intermittent rest breaks due to fatigue.    Personal Factors and Comorbidities Comorbidity 3+;Past/Current Experience    Comorbidities smoking, PAD, HTN, ETOH abuse, chronic hepatitis C with hepatic coma,GAD, esophageal stricture    Examination-Activity Limitations Locomotion Level;Transfers;Stairs;Squat;Carry    Examination-Participation Restrictions Cleaning;Community Activity;Laundry    Stability/Clinical Decision Making Stable/Uncomplicated    Rehab Potential Good    PT Frequency 2x / week   12 visits over 8 weeks   PT Duration 8 weeks   12 visits over 8 weeks   PT Treatment/Interventions ADLs/Self Care Home Management;DME Instruction;Gait training;Stair training;Therapeutic activities;Functional mobility training;Therapeutic exercise;Balance training;Neuromuscular re-education;Orthotic Fit/Training;Patient/family education;Vestibular    PT Next Visit Plan look at HEP and review and add strengthening as needed. did pt get foot up  brace? (could use with either RLE/LLE). weight shifting activities for LLE. NuStep/SciFit for endurance. BLE strength. stair training. sit <> Stands    PT Home Exercise Plan [id number]    Consulted and Agree with Plan of Care Patient             Patient will benefit from skilled therapeutic intervention in order to improve the following deficits and impairments:  Abnormal gait, Decreased activity tolerance, Decreased balance, Decreased coordination, Decreased endurance, Decreased knowledge of use of DME, Difficulty walking, Decreased strength, Dizziness, Impaired sensation, Postural dysfunction  Visit Diagnosis: Unsteadiness on feet  Other symptoms and signs involving the nervous system  Hemiplegia and hemiparesis following other cerebrovascular disease  affecting right dominant side [facility])     Problem List Patient Active Problem List   Diagnosis Date Noted   Acute CVA (cerebrovascular accident) ([place]) 12/29/2020   Peripheral arterial disease ([place]) 12/20/2020   History of esophageal stricture 07/08/2020   Insomnia 12/02/2017   Chronic cough 08/14/2017   Poor appetite 08/29/2016   Peptic stricture of esophagus    History of colonic polyps    Hiatal hernia 03/16/2016   Odynophagia 03/16/2016   Dysphagia 03/16/2016   Alcohol abuse 10/26/2015   Essential hypertension 09/26/2015   PAC (premature atrial contraction) 10/11/2014   PVC (premature ventricular contraction) 10/11/2014   Back muscle spasm 06/15/2014   Generalized anxiety disorder 06/15/2014   Smoking 06/15/2014   Chronic hepatitis C without hepatic coma ([place]) 05/28/2013    Arliss Journey, PT, DPT  06/28/2021, 12:07 PM  [facility] [address] [place], [place], [address] Phone: [phone]   Fax:  [phone]  Name: Kara Bailey MRN: [id number] Date of Birth: Dec 19, 1955

## 2021-06-28 NOTE — Telephone Encounter (Signed)
Patient called and wanted to know what the next steps in her treatment would be. She recently had a stroke and is not sure what to do next. Please advise

## 2021-06-29 ENCOUNTER — Telehealth: Payer: Self-pay | Admitting: Cardiovascular Disease

## 2021-06-29 NOTE — Telephone Encounter (Signed)
Patient called back. Pt reports she would like to be seen by Dr.Berry. Pt was last seen by Dr. Gwenlyn Found 04/19/2021 and was to come back as needed. She reports the pain in her left leg has worsened since she last saw Dr. Gwenlyn Found. She reports as she increases activity in therapy following her stroke, she is noticing more discomfort in her left leg when she is moving. She reports the pain subsides with rest. She reports she "sometimes" gets swelling in her feet bilaterally but worse on the left side. She reports she has deficits on her left side from the stroke as well. She denies discoloration of her leg. Pt denies chest pain or shortness of breath. Nurse made pt an appointment 8/24 with Dr. Gwenlyn Found. Advised pt if her symptoms worsen or she develops chest pain, shortness of breath, or any other concerning symptoms she should call 911 or be taken to the emergency room. Pt verbalized understanding.

## 2021-06-29 NOTE — Telephone Encounter (Signed)
Patient returning call.

## 2021-06-29 NOTE — Telephone Encounter (Signed)
Left message for patient call back. Left message for patient let operator know to contact triage while she is on the phone. ( Multi phone cal back forth)

## 2021-06-29 NOTE — Telephone Encounter (Signed)
Patient is calling Dr. Gwenlyn Found or nurse back

## 2021-06-29 NOTE — Telephone Encounter (Signed)
Attempted to call, no answer. Left message for patient to call back.

## 2021-06-30 ENCOUNTER — Ambulatory Visit: Payer: Medicare HMO

## 2021-06-30 ENCOUNTER — Ambulatory Visit: Payer: Medicare HMO | Admitting: Physical Therapy

## 2021-06-30 NOTE — Telephone Encounter (Signed)
Duplicate encounter. Please see other phone note from 06/29/2021.

## 2021-07-04 ENCOUNTER — Encounter: Payer: Self-pay | Admitting: Physical Therapy

## 2021-07-04 ENCOUNTER — Ambulatory Visit: Payer: Medicare HMO

## 2021-07-04 ENCOUNTER — Ambulatory Visit: Payer: Medicare HMO | Admitting: Physical Therapy

## 2021-07-04 ENCOUNTER — Other Ambulatory Visit: Payer: Self-pay

## 2021-07-04 DIAGNOSIS — R2681 Unsteadiness on feet: Secondary | ICD-10-CM

## 2021-07-04 DIAGNOSIS — R41841 Cognitive communication deficit: Secondary | ICD-10-CM

## 2021-07-04 DIAGNOSIS — R29818 Other symptoms and signs involving the nervous system: Secondary | ICD-10-CM

## 2021-07-04 DIAGNOSIS — M6281 Muscle weakness (generalized): Secondary | ICD-10-CM

## 2021-07-04 DIAGNOSIS — R471 Dysarthria and anarthria: Secondary | ICD-10-CM

## 2021-07-04 DIAGNOSIS — R482 Apraxia: Secondary | ICD-10-CM

## 2021-07-04 DIAGNOSIS — R4701 Aphasia: Secondary | ICD-10-CM

## 2021-07-04 NOTE — Therapy (Signed)
[facility] [address] [place], [place], [address] Phone: [phone]   Fax:  [phone]  Speech Language Pathology Treatment  Patient Details  Name: Kara Bailey MRN: [id number] Date of Birth: 1956/06/14 Referring Provider (SLP): Cipriano Mile, NP   Encounter Date: 07/04/2021   End of Session - 07/04/21 1131     Visit Number 18    Number of Visits 31    Date for SLP Re-Evaluation 08/04/21    Authorization Type [employer]    Authorization Time Period 16 visits 06-09-21 to 08-04-21    Authorization - Visit Number 3    Authorization - Number of Visits 16    SLP Start Time 1103    SLP Stop Time  0973    SLP Time Calculation (min) 42 min    Activity Tolerance Patient tolerated treatment well             Past Medical History:  Diagnosis Date   Anxiety    Chest pain 08/2014   unspecified   Cirrhosis ([place])    Depression    Dizziness 08/2014   Dyspnea 08/2014   Emphysema lung ([facility])    GERD (gastroesophageal reflux disease)    Heart murmur    Hepatitis C    Hiatal hernia    Hypertension    PAC (premature atrial contraction) 10/11/2014   PAD (peripheral artery disease) ([facility])    Palpitations    PVC (premature ventricular contraction) 10/11/2014   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   Stroke ([place])    just slurred speech   Tachycardia 08/2014   Ventral hernia    Weakness 08/2014    Past Surgical History:  Procedure Laterality Date   APPENDECTOMY     CATARACT EXTRACTION, BILATERAL     COLONOSCOPY     COLONOSCOPY WITH PROPOFOL N/A 04/30/2016   Procedure: COLONOSCOPY WITH PROPOFOL;  Surgeon: Daneil Dolin, MD;  [facility];  Service: Endoscopy;  Laterality: N/A;  1230   ESOPHAGOGASTRODUODENOSCOPY     approximately 2010   ESOPHAGOGASTRODUODENOSCOPY (EGD) WITH PROPOFOL N/A 04/30/2016   Procedure: ESOPHAGOGASTRODUODENOSCOPY (EGD) WITH PROPOFOL;  Surgeon: Daneil Dolin,  MD;  [facility];  Service: Endoscopy;  Laterality: N/A;   LEFT HEART CATHETERIZATION WITH CORONARY ANGIOGRAM N/A 10/29/2014   07-14-20- pt denies this Procedure: LEFT HEART CATHETERIZATION WITH CORONARY ANGIOGRAM;  Surgeon: Burnell Blanks, MD;  Location: [facility] CATH LAB;  Service: Cardiovascular;  Laterality: N/A;   MALONEY DILATION N/A 04/30/2016   Procedure: Venia Minks DILATION;  Surgeon: Daneil Dolin, MD;  [facility];  Service: Endoscopy;  Laterality: N/A;   POLYPECTOMY  04/30/2016   Procedure: POLYPECTOMY;  Surgeon: Daneil Dolin, MD;  [facility];  Service: Endoscopy;;  Sigmoid colon polyp removed via hot snare   UPPER GASTROINTESTINAL ENDOSCOPY      There were no vitals filed for this visit.   Subjective Assessment - 07/04/21 1105     Subjective "speech is good"    Currently in Pain? No/denies                   ADULT SLP TREATMENT - 07/04/21 1105       General Information   Behavior/Cognition Alert;Cooperative;Pleasant mood      Treatment Provided   Treatment provided Cognitive-Linquistic      Cognitive-Linquistic Treatment   Treatment focused on Apraxia;Dysarthria;Aphasia    Skilled Treatment SLP targeted flow phonation, in which SLP cued alternation  of blowing versus flow sounds/words to reduce laryneal tension. Following flow phonation, pt exhibited more WNL voicing for ~80% of tasks, simple Q and A, and in conversation. Usual fading to occasional cues required to use compensations during sentence level reading tasks, which may be related in part to reading ability. Occasional min to mod A and extended time required to aid naming and recall of targeted words. Pt reportedly is still experiencing difficulty with cognition, including forgetting what she is thinking and getting lost on way home form store. Pt may benefit from a few ST sessions focused on cognitive skills. Pt c/o getting "choked" on salvia and sometimes on food/drink,  despite reported esophageal dilation. Pt also endorses usual dry mouth.      Assessment / Recommendations / Plan   Plan Continue with current plan of care      Progression Toward Goals   Progression toward goals Progressing toward goals              SLP Education - 07/04/21 1131     Education Details may need to focus ST sessions on cognition    Person(s) Educated Patient    Methods Explanation;Demonstration    Comprehension Verbalized understanding;Returned demonstration;Need further instruction              SLP Short Term Goals - 07/04/21 1134       SLP SHORT TERM GOAL #1   Title Pt will demonstrate dysarthria compensations in 10 minute simple to mod complex conversation with min A over 2 sessions    Status Not Met      SLP SHORT TERM GOAL #2   Title Pt will use memory/attention compensations for appointments, medicine management, and other daily activities with occasional min A over 3 sessions.    Baseline 05-12-21    Status Partially Met      SLP SHORT TERM GOAL #3   Title Pt will ID and correct word finding errors with 80% accuracy given occasional min A over 2 sessions    Baseline 05-12-21    Status Partially Met      SLP SHORT TERM GOAL #4   Title Pt will verbalize 3 safety concerns in the home and generate solutions to them with occasional min A over 2 sessions    Baseline 05-12-21    Status Partially Met      SLP SHORT TERM GOAL #5   Title Pt will complete dysphagia HEP to optimize swallow function with occasional min A over 2 sessions    Status Not Met      SLP SHORT TERM GOAL #6   Title Pt will exhibit improved oral coordination on targeted apraxia tasks with occasional min A over 2 sessions    Status Not Met      SLP SHORT TERM GOAL #7   Title Pt will generate abdominal breathing in 16/20 answers to SLP questions over 2 sessions    Time 4    Period Weeks    Status On-going    Target Date 07/07/21      SLP SHORT TERM GOAL #8   Title Pt will be 90%  intelligible in response to open ended SLP questions with rare min A over 2 sessions    Time 4    Period Weeks    Status On-going    Target Date 07/07/21              SLP Long Term Goals - 07/04/21 1134       SLP  LONG TERM GOAL #1   Title Pt will demonstrate dysarthria compensations in 10 minute simple to mod complex conversation with min A over 2 sessions    Time 8    Period Weeks    Status On-going    Target Date 08/04/21      SLP LONG TERM GOAL #2   Title Pt will use memory/attention compensations for appointments, medicine management, and other daily activities with occasional min A over 3 sessions.    Time 8    Period Weeks    Status On-going    Target Date 08/04/21      SLP LONG TERM GOAL #3   Title Pt will ID and correct word finding errors with 95% accuracy given occasional min A over 2 sessions    Status Partially Met      SLP LONG TERM GOAL #4   Title Pt will report improved diet tolerance and reduced s/sx of aspiration with rare min over 4 sessions    Baseline 06-09-21    Time 8    Period Weeks    Status On-going    Target Date 08/04/21      SLP LONG TERM GOAL #5   Title Pt will undergo repeat objective swallow study as needed if s/sx of aspiration persist or change in respiration indicated    Time 8    Period Weeks    Status On-going    Target Date 08/04/21      SLP LONG TERM GOAL #6   Title Pt will generate abdominal breathing throughout 10 minute conversation occasional min A over 2 sessions    Time 8    Period Weeks    Status On-going    Target Date 08/04/21              Plan - 07/04/21 1132     Clinical Impression Statement Kara Bailey cont to present with changes in speech, cognitive communication, and swallowing s/p CVA in February 2022. Ongoing education and training provided to maximize and coordinate breath support to address verbal apraxia and dysarthria. Some progress exhibited to utilize abdominal breathing and flow phonation to  maximize breath support and reduce laryngeal tension. SLP targeted speech tasks to address word finding and dysarthria, in which occasional extended required for naming. Usual fading to occasional cues to use slow rate and pause to breathe during speech tasks.  Skilled ST is recommended to address speech, cognitive communication, and dysphagia impacting pt's overall safety and functional independence compared to baseline.    Speech Therapy Frequency 2x / week    Duration 8 weeks    Treatment/Interventions Aspiration precaution training;Oral motor exercises;Compensatory strategies;Pharyngeal strengthening exercises;Functional tasks;Patient/family education;Diet toleration management by SLP;Cognitive reorganization;Multimodal communcation approach;Language facilitation;Compensatory techniques;Internal/external aids;SLP instruction and feedback    Potential to Achieve Goals Fair    Potential Considerations Ability to learn/carryover information;Severity of impairments    SLP Home Exercise Plan provided    Consulted and Agree with Plan of Care Patient             Patient will benefit from skilled therapeutic intervention in order to improve the following deficits and impairments:   Verbal apraxia  Dysarthria and anarthria  Aphasia  Cognitive communication deficit    Problem List Patient Active Problem List   Diagnosis Date Noted   Acute CVA (cerebrovascular accident) ([place]) 12/29/2020   Peripheral arterial disease ([place]) 12/20/2020   History of esophageal stricture 07/08/2020   Insomnia 12/02/2017   Chronic cough 08/14/2017   Poor appetite 08/29/2016  Peptic stricture of esophagus    History of colonic polyps    Hiatal hernia 03/16/2016   Odynophagia 03/16/2016   Dysphagia 03/16/2016   Alcohol abuse 10/26/2015   Essential hypertension 09/26/2015   PAC (premature atrial contraction) 10/11/2014   PVC (premature ventricular contraction) 10/11/2014   Back muscle spasm 06/15/2014    Generalized anxiety disorder 06/15/2014   Smoking 06/15/2014   Chronic hepatitis C without hepatic coma ([place]) 05/28/2013    Alinda Deem, MA CCC-SLP 07/04/2021, 2:36 PM  [facility] [address] [place], [place], [address] Phone: [phone]   Fax:  [phone]   Name: Kara Bailey MRN: [id number] Date of Birth: [birth year]

## 2021-07-04 NOTE — Therapy (Addendum)
[facility] [address] [place], [place], [address] Phone: [phone]   Fax:  [phone]  Physical Therapy Treatment  Patient Details  Name: Kara Bailey MRN: [id number] Date of Birth: January 10, 1956 Referring Provider (PT): Cipriano Mile, NP   Encounter Date: 07/04/2021   PT End of Session - 07/04/21 1100     Visit Number 18    Number of Visits 26    Date for PT Re-Evaluation 08/08/21    Authorization Type [employer] -    Authorization Time Period 16 visits from 67/22- 07/17/21, 12 visits through 06/09/21 - 08/08/21    Authorization - Visit Number 1    Authorization - Number of Visits 12    Progress Note Due on Visit 20    PT Start Time 1018    PT Stop Time 1059    PT Time Calculation (min) 41 min    Equipment Utilized During Treatment Gait belt    Activity Tolerance Patient tolerated treatment well    Behavior During Therapy [facility] for tasks assessed/performed             Past Medical History:  Diagnosis Date   Anxiety    Chest pain 08/2014   unspecified   Cirrhosis ([place])    Depression    Dizziness 08/2014   Dyspnea 08/2014   Emphysema lung (HCC)    GERD (gastroesophageal reflux disease)    Heart murmur    Hepatitis C    Hiatal hernia    Hypertension    PAC (premature atrial contraction) 10/11/2014   PAD (peripheral artery disease) (HCC)    Palpitations    PVC (premature ventricular contraction) 10/11/2014   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   Stroke ([place])    just slurred speech   Tachycardia 08/2014   Ventral hernia    Weakness 08/2014    Past Surgical History:  Procedure Laterality Date   APPENDECTOMY     CATARACT EXTRACTION, BILATERAL     COLONOSCOPY     COLONOSCOPY WITH PROPOFOL N/A 04/30/2016   Procedure: COLONOSCOPY WITH PROPOFOL;  Surgeon: Daneil Dolin, MD;  [facility];  Service: Endoscopy;  Laterality: N/A;  1230   ESOPHAGOGASTRODUODENOSCOPY      approximately 2010   ESOPHAGOGASTRODUODENOSCOPY (EGD) WITH PROPOFOL N/A 04/30/2016   Procedure: ESOPHAGOGASTRODUODENOSCOPY (EGD) WITH PROPOFOL;  Surgeon: Daneil Dolin, MD;  [facility];  Service: Endoscopy;  Laterality: N/A;   LEFT HEART CATHETERIZATION WITH CORONARY ANGIOGRAM N/A 10/29/2014   07-14-20- pt denies this Procedure: LEFT HEART CATHETERIZATION WITH CORONARY ANGIOGRAM;  Surgeon: Burnell Blanks, MD;  Location: [facility] CATH LAB;  Service: Cardiovascular;  Laterality: N/A;   MALONEY DILATION N/A 04/30/2016   Procedure: Venia Minks DILATION;  Surgeon: Daneil Dolin, MD;  [facility];  Service: Endoscopy;  Laterality: N/A;   POLYPECTOMY  04/30/2016   Procedure: POLYPECTOMY;  Surgeon: Daneil Dolin, MD;  [facility];  Service: Endoscopy;;  Sigmoid colon polyp removed via hot snare   UPPER GASTROINTESTINAL ENDOSCOPY      There were no vitals filed for this visit.   Subjective Assessment - 07/04/21 1019     Subjective No changes, no pain. No falls. Has an appt with her cardiologist tomorrow due to continued pain in her L leg.    Pertinent History smoking, PAD, HTN, ETOH abuse, chronic hepatitis C with hepatic coma,GAD, esophageal stricture    Limitations Walking;Standing    Patient Stated Goals wants to  work on walking normally    Currently in Pain? No/denies                               [facility] Adult PT Treatment/Exercise - 07/04/21 1056       Transfers   Transfers Sit to Stand;Stand to Sit    Sit to Stand 5: Supervision;With upper extremity assist    Stand to Sit 5: Supervision;With upper extremity assist    Comments with LLE posteriorly in stride position x5 reps with BUE suportt to stand, cues for incr forward weight shift and controlled descent      Ambulation/Gait   Ambulation/Gait Yes    Ambulation/Gait Assistance 5: Supervision;4: Min guard    Ambulation/Gait Assistance Details pt still needs to order foot up brace.  cues for heel stike. needing min guard at end of session when more fatigued    Ambulation Distance (Feet) 100 Feet   x1, 80 x 2   Assistive device None    Gait Pattern Decreased arm swing - right;Step-through pattern;Decreased stance time - right;Decreased hip/knee flexion - right;Decreased dorsiflexion - right;Poor foot clearance - right;Lateral trunk lean to right;Poor foot clearance - left    Ambulation Surface Level;Indoor                 Balance Exercises - 07/04/21 1037       Balance Exercises: Standing   Tandem Stance Eyes open;Intermittent upper extremity support;Limitations    Tandem Stance Time reviewed from HEP - 2 x30 seconds B at countertop with intermittent letting go for balance. pt reports that she tried tandem gait at home and almost lost her balance, discussed that this was not an exercise given for home and some exercises are just to be performed in therapy for safety    SLS with Vectors Solid surface    SLS with Vectors Limitations alternating taps to 4" step without UE support x10 reps, then alternating taps to 6" step with intermittent taps to bars for balance x10 reps    Standing, One Foot on a Step Eyes open;Head turns;4 reps;Limitations    Standing, One Foot on a Step Limitations without UE support, on 6" step; holding x15 seconds B, then performing x5 reps head turns, x5 reps head nods with each leg as stance leg. use of mirror for visual cue for posture and cues for glute activation on stance leg    Rockerboard Lateral;EO;Limitations    Rockerboard Limitations weight shifting x12 reps B beginning with UE support>none with verbal/tactile cues to weight shift through hips, holding board steady in M/L direction 2 x 30 seconds    Step Ups Forward;4 inch;Limitations    Step Ups Limitations x5 reps B alternating step ups with march, x5 reps B step up/up and down down - use of BUE support, cues for posture                 PT Short Term Goals - 06/28/21 0935        PT SHORT TERM GOAL #1   Title Pt will decr TUG time to 14 seconds or less in order to demo decr fall risk. ALL STGS DUE 06/30/21    Baseline 15.93 seconds on 06/06/21, 13.25 seconds on 06/28/21    Time 3    Period Weeks    Status Achieved    Target Date 06/30/21      PT SHORT TERM GOAL #2   Title Pt  will undego 2-3 MWT with LTG to be written as appropriate with use of foot up brace to determine improved gait endurance.    Baseline 3MWT -  360' on 06/15/21 with LTG written.    Time 3    Period Weeks    Status Achieved      PT SHORT TERM GOAL #3   Title Pt will improve gait speed to at least 2.2 ft/sec with no AD vs. LRAD in order to demo decr fall risk.    Baseline last assessed 1.83 ft/sec no AD; 12.63 seconds = 2.59 ft/sec on 06/28/21    Time 3    Period Weeks    Status Achieved               PT Long Term Goals - 06/09/21 1024       PT LONG TERM GOAL #1   Title Pt will be independent with final HEP in order to build upon functional gains made in therapy ALL LTGS DUE 06/13/21    Baseline --    Time 6    Period Weeks    Status New      PT LONG TERM GOAL #2   Title Pt will go ascend and descend 4 steps with single handrail vs. use of LRAD with supervision in order to be able to go to her son's house.    Baseline min guard needed on 06/09/21    Time 6    Period Weeks    Status On-going      PT LONG TERM GOAL #3   Title Pt will decr 5x sit <> stand time to 20 seconds or less in order to demo improved BLE strength and balance.    Baseline 31.47 seconds on 05/09/21 with no UE support; 23.75 seconds from standard height chair on 06/09/21    Time 6    Period Weeks    Status Revised      PT LONG TERM GOAL #4   Title 2-3 MWT goal to be written as appropriate in order to demo improved gait endurance.    Baseline --    Time 6    Period Weeks    Status New      PT LONG TERM GOAL #5   Title --    Baseline --    Time --    Period --    Status --      PT LONG TERM GOAL  #6   Title Pt will improve FOTO score to at least 60% in order to demo improved functional outcomes.    Baseline 50%; 52.4% on 06/06/21    Time 6    Period Weeks    Status On-going                   Plan - 07/04/21 1221     Clinical Impression Statement Today's skilled session focused on balance strategies working on weight shifting and SLS activities, with focus on weight shift towards LLE and BLE strenthening. Pt tolerated session well, does need cues for equal weight bearing through BLE when standing (performs consistently with RLE staggered posteriorly). Will continue to progress towards LTGs.    Personal Factors and Comorbidities Comorbidity 3+;Past/Current Experience    Comorbidities smoking, PAD, HTN, ETOH abuse, chronic hepatitis C with hepatic coma,GAD, esophageal stricture    Examination-Activity Limitations Locomotion Level;Transfers;Stairs;Squat;Carry    Examination-Participation Restrictions Cleaning;Community Activity;Laundry    Stability/Clinical Decision Making Stable/Uncomplicated    Rehab Potential Good  PT Frequency 2x / week   12 visits over 8 weeks   PT Duration 8 weeks   12 visits over 8 weeks   PT Treatment/Interventions ADLs/Self Care Home Management;DME Instruction;Gait training;Stair training;Therapeutic activities;Functional mobility training;Therapeutic exercise;Balance training;Neuromuscular re-education;Orthotic Fit/Training;Patient/family education;Vestibular    PT Next Visit Plan look at HEP and update (add sit <> stands and any other strengthening). did pt get foot up brace? (could use with either RLE/LLE). weight shifting activities for LLE. NuStep/SciFit for endurance. BLE strength.    PT Home Exercise Plan [facility]    Consulted and Agree with Plan of Care Patient             Patient will benefit from skilled therapeutic intervention in order to improve the following deficits and impairments:  Abnormal gait, Decreased activity tolerance,  Decreased balance, Decreased coordination, Decreased endurance, Decreased knowledge of use of DME, Difficulty walking, Decreased strength, Dizziness, Impaired sensation, Postural dysfunction  Visit Diagnosis: Unsteadiness on feet  Other symptoms and signs involving the nervous system  Muscle weakness (generalized)     Problem List Patient Active Problem List   Diagnosis Date Noted   Acute CVA (cerebrovascular accident) ([place]) 12/29/2020   Peripheral arterial disease ([place]) 12/20/2020   History of esophageal stricture 07/08/2020   Insomnia 12/02/2017   Chronic cough 08/14/2017   Poor appetite 08/29/2016   Peptic stricture of esophagus    History of colonic polyps    Hiatal hernia 03/16/2016   Odynophagia 03/16/2016   Dysphagia 03/16/2016   Alcohol abuse 10/26/2015   Essential hypertension 09/26/2015   PAC (premature atrial contraction) 10/11/2014   PVC (premature ventricular contraction) 10/11/2014   Back muscle spasm 06/15/2014   Generalized anxiety disorder 06/15/2014   Smoking 06/15/2014   Chronic hepatitis C without hepatic coma ([place]) 05/28/2013    Arliss Journey, PT, DPT  07/04/2021, 12:23 PM  [facility] [address] [place], [place], [address] Phone: [phone]   Fax:  [phone]  Name: Kara Bailey MRN: [id number] Date of Birth: [birth year]

## 2021-07-05 ENCOUNTER — Encounter: Payer: Self-pay | Admitting: Cardiovascular Disease

## 2021-07-05 ENCOUNTER — Ambulatory Visit (INDEPENDENT_AMBULATORY_CARE_PROVIDER_SITE_OTHER): Payer: Medicare HMO | Admitting: Cardiovascular Disease

## 2021-07-05 DIAGNOSIS — I739 Peripheral vascular disease, unspecified: Secondary | ICD-10-CM

## 2021-07-05 NOTE — Progress Notes (Signed)
07/05/2021 Kara Bailey   23-Jun-1956  [id number]  Primary Physician Andree Moro, DO Primary Cardiologist: Lorretta Harp MD [name], [place]  HPI:  Kara Bailey is a 65 y.o.   mildly overweight divorced African-American female mother of 2, grandmother of 61 grandchildren referred by Dr. Audie Box, her cardiologist, for peripheral vascular evaluation.  She is retired from doing factory work.  I last saw her in the office 04/19/2021. Risk factors include smoking up to 1/2 pack a day for the last 50 years and treated hypertension.  There is no family history.  She is never had heart attack or stroke.  Denies chest pain or shortness of breath.  She complains of left calf lifestyle of any claudication which she has had for several years.  Recent Doppler studies performed 11/03/2020 revealed a right ABI 1.0 and a left of 0.89 with moderate disease noted in the mid left SFA   She had suffered a stroke when I saw her last and her peripheral vascular work-up was put on hold.  Her neurologic deficits have since resolved except for dysarthria and she is participating physical therapy.  She does continue to smoke however.  She tells me that her right leg is "tighter than her left but really denies claudication type symptoms.  Interestingly, her right ABI is normal on her left is 0.89.  She has no obstructive disease for the most part on the right side with mild to moderate disease in her left SFA.  Since I saw her 2 months ago she is continue to do outpatient rehab/physical therapy.  She is walking much better but continues to complain of left calf claudication.  She wishes to proceed with outpatient peripheral angiography and endovascular therapy.   Current Meds  Medication Sig   albuterol (VENTOLIN HFA) 108 (90 Base) MCG/ACT inhaler Inhale 2 puffs into the lungs every 6 (six) hours as needed for shortness of breath or wheezing.   aspirin EC 81 MG EC tablet Take 1 tablet (81 mg total) by mouth  daily. Swallow whole. (Patient taking differently: Take 81 mg by mouth in the morning. Swallow whole.)   atorvastatin (LIPITOR) 80 MG tablet Take 1 tablet (80 mg total) by mouth daily. (Patient taking differently: Take 80 mg by mouth at bedtime.)   busPIRone (BUSPAR) 15 MG tablet Take 1 tablet (15 mg total) by mouth 2 (two) times daily.   cyclobenzaprine (FLEXERIL) 10 MG tablet Take 10 mg by mouth daily as needed for muscle spasms (Back pain).   diltiazem (TIAZAC) 180 MG 24 hr capsule Take 180 mg by mouth in the morning.   escitalopram (LEXAPRO) 20 MG tablet Take 20 mg by mouth in the morning.   famotidine (PEPCID) 40 MG tablet Take 40 mg by mouth at bedtime.   hydroxypropyl methylcellulose / hypromellose (ISOPTO TEARS / GONIOVISC) 2.5 % ophthalmic solution Place 1 drop into both eyes 2 (two) times daily as needed for dry eyes.   hydrOXYzine (ATARAX/VISTARIL) 50 MG tablet Take 50 mg by mouth 3 (three) times daily as needed.   mirtazapine (REMERON) 30 MG tablet Take 0.5 tablets (15 mg total) by mouth at bedtime. (Patient taking differently: Take 30 mg by mouth at bedtime.)   Multiple Vitamin (MULTIVITAMIN WITH MINERALS) TABS tablet Take 1 tablet by mouth daily.   nicotine (NICODERM CQ - DOSED IN MG/24 HOURS) 21 mg/24hr patch Place 1 patch (21 mg total) onto the skin daily.   pantoprazole (PROTONIX) 40 MG tablet  Take 1 tablet (40 mg total) by mouth daily. (Patient taking differently: Take 40 mg by mouth in the morning.)   thiamine 100 MG tablet Take 1 tablet (100 mg total) by mouth daily. (Patient taking differently: Take 100 mg by mouth in the morning.)   Current Facility-Administered Medications for the 07/05/21 encounter (Office Visit) with Lorretta Harp, MD  Medication   sodium chloride flush (NS) 0.9 % injection 3 mL     No Known Allergies  Social History   Socioeconomic History   Marital status: Divorced    Spouse name: Not on file   Number of children: 2   Years of education: Not  on file   Highest education level: Not on file  Occupational History   Occupation: retired  Tobacco Use   Smoking status: Every Day    Packs/day: 0.50    Years: 40.00    Pack years: 20.00    Types: Cigarettes    Start date: 11/12/1968   Smokeless tobacco: Never   Tobacco comments:    cutting back, wearing patches  Vaping Use   Vaping Use: Never used  Substance and Sexual Activity   Alcohol use: Yes    Alcohol/week: 0.0 standard drinks    Comment: beer on weekends (6-pack)   Drug use: No   Sexual activity: Not Currently  Other Topics Concern   Not on file  Social History Narrative   Not on file   Social Determinants of Health   Financial Resource Strain: Not on file  Food Insecurity: Not on file  Transportation Needs: Not on file  Physical Activity: Not on file  Stress: Not on file  Social Connections: Not on file  Intimate Partner Violence: Not on file     Review of Systems: General: negative for chills, fever, night sweats or weight changes.  Cardiovascular: negative for chest pain, dyspnea on exertion, edema, orthopnea, palpitations, paroxysmal nocturnal dyspnea or shortness of breath Dermatological: negative for rash Respiratory: negative for cough or wheezing Urologic: negative for hematuria Abdominal: negative for nausea, vomiting, diarrhea, bright red blood per rectum, melena, or hematemesis Neurologic: negative for visual changes, syncope, or dizziness All other systems reviewed and are otherwise negative except as noted above.    Blood pressure (!) 153/80, pulse 99, height '5\' 6"'$  (1.676 m), weight 154 lb 12.8 oz (70.2 kg), SpO2 100 %.  General appearance: alert and no distress Neck: no adenopathy, no carotid bruit, no JVD, supple, symmetrical, trachea midline, and thyroid not enlarged, symmetric, no tenderness/mass/nodules Lungs: clear to auscultation bilaterally Heart: regular rate and rhythm, S1, S2 normal, no murmur, click, rub or gallop Extremities:  extremities normal, atraumatic, no cyanosis or edema Pulses: 2+ and symmetric Skin: Skin color, texture, turgor normal. No rashes or lesions Neurologic: Grossly normal  EKG not performed today  ASSESSMENT AND PLAN:   Peripheral arterial disease ([place]) Ms. Maciolek returns for follow-up of PAD evaluation.  I last saw her 04/19/2021.  She is rehabilitating from her stroke and is undergoing physical therapy.  She does complain of left calf claudication and wishes to proceed with outpatient peripheral vascular angiography and endovascular therapy.  Her Doppler studies performed 11/03/2020 revealed a right ABI 1.0, left of 0.89 with a moderately elevated signal in the left common femoral and mid left SFA.     Lorretta Harp MD FACP,FACC,FAHA, [facility] 07/05/2021 11:32 AM

## 2021-07-05 NOTE — H&P (View-Only) (Signed)
07/05/2021 Kara Bailey   04-Jan-1956  [id number]  Primary Physician Kara Moro, DO Primary Cardiologist: Kara Harp MD Kara Bailey, [place]  HPI:  Kara Bailey is a 65 y.o.   mildly overweight divorced African-American female mother of 2, grandmother of 70 grandchildren referred by Dr. Audie Bailey, her cardiologist, for peripheral vascular evaluation.  She is retired from doing factory work.  I last saw her in the office 04/19/2021. Risk factors include smoking up to 1/2 pack a day for the last 50 years and treated hypertension.  There is no family history.  She is never had heart attack or stroke.  Denies chest pain or shortness of breath.  She complains of left calf lifestyle of any claudication which she has had for several years.  Recent Doppler studies performed 11/03/2020 revealed a right ABI 1.0 and a left of 0.89 with moderate disease noted in the mid left SFA   She had suffered a stroke when I saw her last and her peripheral vascular work-up was put on hold.  Her neurologic deficits have since resolved except for dysarthria and she is participating physical therapy.  She does continue to smoke however.  She tells me that her right leg is "tighter than her left but really denies claudication type symptoms.  Interestingly, her right ABI is normal on her left is 0.89.  She has no obstructive disease for the most part on the right side with mild to moderate disease in her left SFA.  Since I saw her 2 months ago she is continue to do outpatient rehab/physical therapy.  She is walking much better but continues to complain of left calf claudication.  She wishes to proceed with outpatient peripheral angiography and endovascular therapy.   Current Meds  Medication Sig   albuterol (VENTOLIN HFA) 108 (90 Base) MCG/ACT inhaler Inhale 2 puffs into the lungs every 6 (six) hours as needed for shortness of breath or wheezing.   aspirin EC 81 MG EC tablet Take 1 tablet (81 mg total) by mouth  daily. Swallow whole. (Patient taking differently: Take 81 mg by mouth in the morning. Swallow whole.)   atorvastatin (LIPITOR) 80 MG tablet Take 1 tablet (80 mg total) by mouth daily. (Patient taking differently: Take 80 mg by mouth at bedtime.)   busPIRone (BUSPAR) 15 MG tablet Take 1 tablet (15 mg total) by mouth 2 (two) times daily.   cyclobenzaprine (FLEXERIL) 10 MG tablet Take 10 mg by mouth daily as needed for muscle spasms (Back pain).   diltiazem (TIAZAC) 180 MG 24 hr capsule Take 180 mg by mouth in the morning.   escitalopram (LEXAPRO) 20 MG tablet Take 20 mg by mouth in the morning.   famotidine (PEPCID) 40 MG tablet Take 40 mg by mouth at bedtime.   hydroxypropyl methylcellulose / hypromellose (ISOPTO TEARS / GONIOVISC) 2.5 % ophthalmic solution Place 1 drop into both eyes 2 (two) times daily as needed for dry eyes.   hydrOXYzine (ATARAX/VISTARIL) 50 MG tablet Take 50 mg by mouth 3 (three) times daily as needed.   mirtazapine (REMERON) 30 MG tablet Take 0.5 tablets (15 mg total) by mouth at bedtime. (Patient taking differently: Take 30 mg by mouth at bedtime.)   Multiple Vitamin (MULTIVITAMIN WITH MINERALS) TABS tablet Take 1 tablet by mouth daily.   nicotine (NICODERM CQ - DOSED IN MG/24 HOURS) 21 mg/24hr patch Place 1 patch (21 mg total) onto the skin daily.   pantoprazole (PROTONIX) 40 MG tablet  Take 1 tablet (40 mg total) by mouth daily. (Patient taking differently: Take 40 mg by mouth in the morning.)   thiamine 100 MG tablet Take 1 tablet (100 mg total) by mouth daily. (Patient taking differently: Take 100 mg by mouth in the morning.)   Current Facility-Administered Medications for the 07/05/21 encounter (Office Visit) with Kara Harp, MD  Medication   sodium chloride flush (NS) 0.9 % injection 3 mL     No Known Allergies  Social History   Socioeconomic History   Marital status: Divorced    Spouse name: Not on file   Number of children: 2   Years of education: Not  on file   Highest education level: Not on file  Occupational History   Occupation: retired  Tobacco Use   Smoking status: Every Day    Packs/day: 0.50    Years: 40.00    Pack years: 20.00    Types: Cigarettes    Start date: 11/12/1968   Smokeless tobacco: Never   Tobacco comments:    cutting back, wearing patches  Vaping Use   Vaping Use: Never used  Substance and Sexual Activity   Alcohol use: Yes    Alcohol/week: 0.0 standard drinks    Comment: beer on weekends (6-pack)   Drug use: No   Sexual activity: Not Currently  Other Topics Concern   Not on file  Social History Narrative   Not on file   Social Determinants of Health   Financial Resource Strain: Not on file  Food Insecurity: Not on file  Transportation Needs: Not on file  Physical Activity: Not on file  Stress: Not on file  Social Connections: Not on file  Intimate Partner Violence: Not on file     Review of Systems: General: negative for chills, fever, night sweats or weight changes.  Cardiovascular: negative for chest pain, dyspnea on exertion, edema, orthopnea, palpitations, paroxysmal nocturnal dyspnea or shortness of breath Dermatological: negative for rash Respiratory: negative for cough or wheezing Urologic: negative for hematuria Abdominal: negative for nausea, vomiting, diarrhea, bright red blood per rectum, melena, or hematemesis Neurologic: negative for visual changes, syncope, or dizziness All other systems reviewed and are otherwise negative except as noted above.    Blood pressure (!) 153/80, pulse 99, height '5\' 6"'$  (1.676 m), weight 154 lb 12.8 oz (70.2 kg), SpO2 100 %.  General appearance: alert and no distress Neck: no adenopathy, no carotid bruit, no JVD, supple, symmetrical, trachea midline, and thyroid not enlarged, symmetric, no tenderness/mass/nodules Lungs: clear to auscultation bilaterally Heart: regular rate and rhythm, S1, S2 normal, no murmur, click, rub or gallop Extremities:  extremities normal, atraumatic, no cyanosis or edema Pulses: 2+ and symmetric Skin: Skin color, texture, turgor normal. No rashes or lesions Neurologic: Grossly normal  EKG not performed today  ASSESSMENT AND PLAN:   Peripheral arterial disease ([place]) Ms. Lozzi returns for follow-up of PAD evaluation.  I last saw her 04/19/2021.  She is rehabilitating from her stroke and is undergoing physical therapy.  She does complain of left calf claudication and wishes to proceed with outpatient peripheral vascular angiography and endovascular therapy.  Her Doppler studies performed 11/03/2020 revealed a right ABI 1.0, left of 0.89 with a moderately elevated signal in the left common femoral and mid left SFA.     Kara Harp MD FACP,FACC,FAHA, [facility] 07/05/2021 11:32 AM

## 2021-07-05 NOTE — Patient Instructions (Addendum)
Medication Instructions:  The current medical regimen is effective;  continue present plan and medications.  *If you need a refill on your cardiac medications before your next appointment, please call your pharmacy*   Lab Work: BMET, CBC today   If you have labs (blood work) drawn today and your tests are completely normal, you will receive your results only by: [place] (if you have MyChart) OR A paper copy in the mail If you have any lab test that is abnormal or we need to change your treatment, we will call you to review the results.   Testing/Procedures: Your physician has requested that you have a peripheral vascular angiogram. This exam is performed at the hospital. During this exam IV contrast is used to look at arterial blood flow. Please review the information sheet given for details.  Your physician has requested that you have an ankle brachial index (ABI) (1 week post) . During this test an ultrasound and blood pressure cuff are used to evaluate the arteries that supply the arms and legs with blood. Allow thirty minutes for this exam. There are no restrictions or special instructions.  Your physician has requested that you have a lower extremity arterial exercise duplex (1 week post). During this test, exercise and ultrasound are used to evaluate arterial blood flow in the legs. Allow one hour for this exam. There are no restrictions or special instructions.   Follow-Up: At [facility], you and your health needs are our priority.  As part of our continuing mission to provide you with exceptional heart care, we have created designated Provider Care Teams.  These Care Teams include your primary Cardiologist (physician) and Advanced Practice Providers (APPs -  Physician Assistants and Nurse Practitioners) who all work together to provide you with the care you need, when you need it.  We recommend signing up for the patient portal called "MyChart".  Sign up information is  provided on this After Visit Summary.  MyChart is used to connect with patients for Virtual Visits (Telemedicine).  Patients are able to view lab/test results, encounter notes, upcoming appointments, etc.  Non-urgent messages can be sent to your provider as well.   To learn more about what you can do with MyChart, go to [email or web address].    Your next appointment:   September 28th at 9:00 AM (2 week follow up)   The format for your next appointment:   In Person  Provider:   Quay Burow, MD   Other Instructions  [place] [address] Dept: [phone] Loc: [place]  07/05/2021  You are scheduled for a Peripheral Angiogram on Thursday, September 1 with Dr. Quay Burow.  1. Please arrive at the [facility] (Main Entrance A) at [facility]: [address] [place], [place] [address] at 5:30 AM (This time is two hours before your procedure to ensure your preparation). Free valet parking service is available.   Special note: Every effort is made to have your procedure done on time. Please understand that emergencies sometimes delay scheduled procedures.  2. Diet: Do not eat solid foods after midnight.  The patient may have clear liquids until 5am upon the day of the procedure.  3. Labs: You will need to have blood drawn CBC, BMET. You do not need to be fasting.  4. Medication instructions in preparation for your procedure:   Contrast Allergy: No  On the morning  of your procedure, take your Aspirin and any morning medicines NOT listed above.  You may use sips of water.  5. Plan for one night stay--bring personal belongings. 6. Bring a current list of your medications and current insurance cards. 7. You MUST have a responsible person to drive you home. 8. Someone MUST be with you the first 24 hours after you arrive home or your  discharge will be delayed. 9. Please wear clothes that are easy to get on and off and wear slip-on shoes.  Thank you for allowing [place] to care for you!   -- [place]

## 2021-07-05 NOTE — Assessment & Plan Note (Signed)
Kara Bailey returns for follow-up of PAD evaluation.  I last saw her 04/19/2021.  She is rehabilitating from her stroke and is undergoing physical therapy.  She does complain of left calf claudication and wishes to proceed with outpatient peripheral vascular angiography and endovascular therapy.  Her Doppler studies performed 11/03/2020 revealed a right ABI 1.0, left of 0.89 with a moderately elevated signal in the left common femoral and mid left SFA.

## 2021-07-06 LAB — BASIC METABOLIC PANEL
BUN/Creatinine Ratio: 9 — ABNORMAL LOW (ref 12–28)
BUN: 9 mg/dL (ref 8–27)
CO2: 22 mmol/L (ref 20–29)
Calcium: 9.2 mg/dL (ref 8.7–10.3)
Chloride: 105 mmol/L (ref 96–106)
Creatinine, Ser: 1 mg/dL (ref 0.57–1.00)
Glucose: 90 mg/dL (ref 65–99)
Potassium: 4.9 mmol/L (ref 3.5–5.2)
Sodium: 141 mmol/L (ref 134–144)
eGFR: 62 mL/min/{1.73_m2} (ref >59)

## 2021-07-06 LAB — CBC
Hematocrit: 41.4 % (ref 34.0–46.6)
Hemoglobin: 13.9 g/dL (ref 11.1–15.9)
MCH: 30.6 pg (ref 26.6–33.0)
MCHC: 33.6 g/dL (ref 31.5–35.7)
MCV: 91 fL (ref 79–97)
Platelets: 279 10*3/uL (ref 150–450)
RBC: 4.54 x10E6/uL (ref 3.77–5.28)
RDW: 12.6 % (ref 11.7–15.4)
WBC: 7.7 10*3/uL (ref 3.4–10.8)

## 2021-07-07 ENCOUNTER — Encounter: Payer: Self-pay | Admitting: Physical Therapy

## 2021-07-07 ENCOUNTER — Ambulatory Visit: Payer: Medicare HMO

## 2021-07-07 ENCOUNTER — Ambulatory Visit: Payer: Medicare HMO | Admitting: Physical Therapy

## 2021-07-07 ENCOUNTER — Other Ambulatory Visit: Payer: Self-pay

## 2021-07-07 DIAGNOSIS — M6281 Muscle weakness (generalized): Secondary | ICD-10-CM

## 2021-07-07 DIAGNOSIS — R29818 Other symptoms and signs involving the nervous system: Secondary | ICD-10-CM

## 2021-07-07 DIAGNOSIS — R2681 Unsteadiness on feet: Secondary | ICD-10-CM

## 2021-07-07 DIAGNOSIS — R471 Dysarthria and anarthria: Secondary | ICD-10-CM

## 2021-07-07 DIAGNOSIS — R482 Apraxia: Secondary | ICD-10-CM

## 2021-07-07 DIAGNOSIS — R41841 Cognitive communication deficit: Secondary | ICD-10-CM

## 2021-07-07 NOTE — Patient Instructions (Signed)
Access Code: [facility] URL: [email or web address] Date: 07/07/2021 Prepared by: Willow Ora  Exercises Sit to/from Stand in Stride position - 1 x daily - 5 x weekly - 2 sets - 5 reps Seated Knee Extension with Resistance - 1 x daily - 5 x weekly - 1 sets - 10 reps Standing Gastroc Stretch - 2 x daily - 5 x weekly - 2 sets - 30 hold Heel Toe Raises with Counter Support - 1 x daily - 5 x weekly - 1 sets - 10 reps Standing Tandem Balance with Counter Support - 1 x daily - 5 x weekly - 3 sets - 20 hold Standing Single Leg Stance with Counter Support - 1 x daily - 5 x weekly - 1 sets - 3 reps - 10 hold

## 2021-07-07 NOTE — Therapy (Signed)
[facility] [address] [place], [place], [address] Phone: [phone]   Fax:  [phone]  Speech Language Pathology Treatment  Patient Details  Name: Kara Bailey MRN: [id number] Date of Birth: [birth year] Referring Provider (SLP): Cipriano Mile, NP   Encounter Date: 07/07/2021   End of Session - 07/07/21 1151     Visit Number 19    Number of Visits 31    Date for SLP Re-Evaluation 08/04/21    Authorization Type [employer]    Authorization Time Period 16 visits 06-09-21 to 08-04-21    Authorization - Visit Number 3    Authorization - Number of Visits 54    SLP Start Time 0933    SLP Stop Time  8341    SLP Time Calculation (min) 42 min    Activity Tolerance Patient tolerated treatment well             Past Medical History:  Diagnosis Date   Anxiety    Chest pain 08/2014   unspecified   Cirrhosis ([place])    Depression    Dizziness 08/2014   Dyspnea 08/2014   Emphysema lung (H[facility])    GERD (gastroesophageal reflux disease)    Heart murmur    Hepatitis C    Hiatal hernia    Hypertension    PAC (premature atrial contraction) 10/11/2014   PAD (peripheral artery disease) (HCC)    Palpitations    PVC (premature ventricular contraction) 10/11/2014   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   Stroke ([place])    just slurred speech   Tachycardia 08/2014   Ventral hernia    Weakness 08/2014    Past Surgical History:  Procedure Laterality Date   APPENDECTOMY     CATARACT EXTRACTION, BILATERAL     COLONOSCOPY     COLONOSCOPY WITH PROPOFOL N/A 04/30/2016   Procedure: COLONOSCOPY WITH PROPOFOL;  Surgeon: Daneil Dolin, MD;  [facility];  Service: Endoscopy;  Laterality: N/A;  1230   ESOPHAGOGASTRODUODENOSCOPY     approximately 2010   ESOPHAGOGASTRODUODENOSCOPY (EGD) WITH PROPOFOL N/A 04/30/2016   Procedure: ESOPHAGOGASTRODUODENOSCOPY (EGD) WITH PROPOFOL;  Surgeon: Daneil Dolin,  MD;  [facility];  Service: Endoscopy;  Laterality: N/A;   LEFT HEART CATHETERIZATION WITH CORONARY ANGIOGRAM N/A 10/29/2014   07-14-20- pt denies this Procedure: LEFT HEART CATHETERIZATION WITH CORONARY ANGIOGRAM;  Surgeon: Burnell Blanks, MD;  Location: [facility] CATH LAB;  Service: Cardiovascular;  Laterality: N/A;   MALONEY DILATION N/A 04/30/2016   Procedure: Venia Minks DILATION;  Surgeon: Daneil Dolin, MD;  [facility];  Service: Endoscopy;  Laterality: N/A;   POLYPECTOMY  04/30/2016   Procedure: POLYPECTOMY;  Surgeon: Daneil Dolin, MD;  [facility];  Service: Endoscopy;;  Sigmoid colon polyp removed via hot snare   UPPER GASTROINTESTINAL ENDOSCOPY      There were no vitals filed for this visit.   Subjective Assessment - 07/07/21 0944     Subjective Pt is practicing flow phonation words every day.    Currently in Pain? No/denies                   ADULT SLP TREATMENT - 07/07/21 0944       General Information   Behavior/Cognition Alert;Cooperative;Pleasant mood      Treatment Provided   Treatment provided Cognitive-Linquistic      Cognitive-Linquistic Treatment   Treatment focused on Apraxia;Dysarthria;Aphasia    Skilled Treatment SLP reiterated that when  she tries to push the speech out it is harder for her to produce. she needs to focus on slow easy breath in, relaxed, slow articulation, and breath and voice when she talks. Doing this, pt was 85% successful with spontaneious sentence responses today - a few times SLP saw pt begin to "push" articulation and then relax and demonstrate more fluidity than had she pushed her speech out. SLP with long explanation about rationale for relaxed, gentle, articulation and not "pushing" taking into consideration consequencs of CVA.      Assessment / Recommendations / Plan   Plan Continue with current plan of care      Progression Toward Goals   Progression toward goals Progressing toward goals               SLP Education - 07/07/21 1150     Education Details rationale for speech compensations of gentle, relaxed, flow voice/speech, relaxingand calm are always better than "pushing" to initiate speaking    Person(s) Educated Patient    Methods Explanation;Demonstration;Verbal cues    Comprehension Verbalized understanding;Verbal cues required;Returned demonstration;Need further instruction              SLP Short Term Goals - 07/04/21 1134       SLP SHORT TERM GOAL #1   Title Pt will demonstrate dysarthria compensations in 10 minute simple to mod complex conversation with min A over 2 sessions    Status Not Met      SLP SHORT TERM GOAL #2   Title Pt will use memory/attention compensations for appointments, medicine management, and other daily activities with occasional min A over 3 sessions.    Baseline 05-12-21    Status Partially Met      SLP SHORT TERM GOAL #3   Title Pt will ID and correct word finding errors with 80% accuracy given occasional min A over 2 sessions    Baseline 05-12-21    Status Partially Met      SLP SHORT TERM GOAL #4   Title Pt will verbalize 3 safety concerns in the home and generate solutions to them with occasional min A over 2 sessions    Baseline 05-12-21    Status Partially Met      SLP SHORT TERM GOAL #5   Title Pt will complete dysphagia HEP to optimize swallow function with occasional min A over 2 sessions    Status Not Met      SLP SHORT TERM GOAL #6   Title Pt will exhibit improved oral coordination on targeted apraxia tasks with occasional min A over 2 sessions    Status Not Met      SLP SHORT TERM GOAL #7   Title Pt will generate abdominal breathing in 16/20 answers to SLP questions over 2 sessions    Time 4    Period Weeks    Status On-going    Target Date 07/07/21      SLP SHORT TERM GOAL #8   Title Pt will be 90% intelligible in response to open ended SLP questions with rare min A over 2 sessions    Time 4    Period  Weeks    Status On-going    Target Date 07/07/21              SLP Long Term Goals - 07/07/21 1152       SLP LONG TERM GOAL #1   Title Pt will demonstrate dysarthria compensations in 10 minute simple to mod complex conversation  with min A over 2 sessions    Time 8    Period Weeks    Status On-going      SLP LONG TERM GOAL #2   Title Pt will use memory/attention compensations for appointments, medicine management, and other daily activities with occasional min A over 3 sessions.    Time 8    Period Weeks    Status On-going      SLP LONG TERM GOAL #3   Title Pt will ID and correct word finding errors with 95% accuracy given occasional min A over 2 sessions    Status Partially Met      SLP LONG TERM GOAL #4   Title Pt will report improved diet tolerance and reduced s/sx of aspiration with rare min over 4 sessions    Baseline 06-09-21    Time 8    Period Weeks    Status On-going      SLP LONG TERM GOAL #5   Title Pt will undergo repeat objective swallow study as needed if s/sx of aspiration persist or change in respiration indicated    Time 8    Period Weeks    Status On-going      SLP LONG TERM GOAL #6   Title Pt will generate abdominal breathing throughout 10 minute conversation occasional min A over 2 sessions    Time 8    Period Weeks    Status On-going              Plan - 07/07/21 1152     Clinical Impression Statement Kara Bailey cont to present with changes in speech, cognitive communication, and swallowing s/p CVA in February 2022. Ongoing education and training provided to maximize and coordinate breath support to address verbal apraxia and dysarthria. Some progress exhibited to utilize abdominal breathing and flow phonation to maximize breath support and reduce laryngeal tension. SLP targeted speech tasks to address word finding and dysarthria, in which occasional extended required for naming. Usual fading to occasional cues to use slow rate and pause to  breathe during speech tasks.  Skilled ST is recommended to address speech, cognitive communication, and dysphagia impacting pt's overall safety and functional independence compared to baseline.    Speech Therapy Frequency 2x / week    Duration 8 weeks    Treatment/Interventions Aspiration precaution training;Oral motor exercises;Compensatory strategies;Pharyngeal strengthening exercises;Functional tasks;Patient/family education;Diet toleration management by SLP;Cognitive reorganization;Multimodal communcation approach;Language facilitation;Compensatory techniques;Internal/external aids;SLP instruction and feedback    Potential to Achieve Goals Fair    Potential Considerations Ability to learn/carryover information;Severity of impairments    SLP Home Exercise Plan provided    Consulted and Agree with Plan of Care Patient             Patient will benefit from skilled therapeutic intervention in order to improve the following deficits and impairments:   Verbal apraxia  Dysarthria and anarthria  Cognitive communication deficit    Problem List Patient Active Problem List   Diagnosis Date Noted   Acute CVA (cerebrovascular accident) ([place]) 12/29/2020   Peripheral arterial disease ([place]) 12/20/2020   History of esophageal stricture 07/08/2020   Insomnia 12/02/2017   Chronic cough 08/14/2017   Poor appetite 08/29/2016   Peptic stricture of esophagus    History of colonic polyps    Hiatal hernia 03/16/2016   Odynophagia 03/16/2016   Dysphagia 03/16/2016   Alcohol abuse 10/26/2015   Essential hypertension 09/26/2015   PAC (premature atrial contraction) 10/11/2014   PVC (premature ventricular contraction) 10/11/2014  Back muscle spasm 06/15/2014   Generalized anxiety disorder 06/15/2014   Smoking 06/15/2014   Chronic hepatitis C without hepatic coma ([place]) 05/28/2013    [facility] ,[place], [place]  07/07/2021, 11:52 AM  [facility] [address] [place], [place], [address] Phone: [phone]   Fax:  [phone]   Name: Kara Bailey MRN: [id number] Date of Birth: 05-01-1956

## 2021-07-10 NOTE — Therapy (Signed)
[facility] [address] [place], [place], [address] Phone: [phone]   Fax:  [phone]  Physical Therapy Treatment  Patient Details  Name: Kara Bailey MRN: [id number] Date of Birth: [birth year] Referring Provider (PT): Cipriano Mile, NP   Encounter Date: 07/07/2021    07/07/21 1024  PT Visits / Re-Eval  Visit Number 19  Number of Visits 26  Date for PT Re-Evaluation 08/08/21  Authorization  Authorization Type [employer] -  Authorization Time Period 16 visits from 67/22- 07/17/21, 12 visits through 06/09/21 - 08/08/21  Authorization - Visit Number 2  Authorization - Number of Visits 12  Progress Note Due on Visit 20  PT Time Calculation  PT Start Time 1019  PT Stop Time 1100  PT Time Calculation (min) 41 min  PT - End of Session  Equipment Utilized During Treatment Gait belt  Activity Tolerance Patient tolerated treatment well  Behavior During Therapy [facility] for tasks assessed/performed    Past Medical History:  Diagnosis Date   Anxiety    Chest pain 08/2014   unspecified   Cirrhosis ([place])    Depression    Dizziness 08/2014   Dyspnea 08/2014   Emphysema lung (HCC)    GERD (gastroesophageal reflux disease)    Heart murmur    Hepatitis C    Hiatal hernia    Hypertension    PAC (premature atrial contraction) 10/11/2014   PAD (peripheral artery disease) (HCC)    Palpitations    PVC (premature ventricular contraction) 10/11/2014   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   Stroke ([place])    just slurred speech   Tachycardia 08/2014   Ventral hernia    Weakness 08/2014    Past Surgical History:  Procedure Laterality Date   APPENDECTOMY     CATARACT EXTRACTION, BILATERAL     COLONOSCOPY     COLONOSCOPY WITH PROPOFOL N/A 04/30/2016   Procedure: COLONOSCOPY WITH PROPOFOL;  Surgeon: Daneil Dolin, MD;  [facility];  Service: Endoscopy;  Laterality: N/A;  1230    ESOPHAGOGASTRODUODENOSCOPY     approximately 2010   ESOPHAGOGASTRODUODENOSCOPY (EGD) WITH PROPOFOL N/A 04/30/2016   Procedure: ESOPHAGOGASTRODUODENOSCOPY (EGD) WITH PROPOFOL;  Surgeon: Daneil Dolin, MD;  [facility];  Service: Endoscopy;  Laterality: N/A;   LEFT HEART CATHETERIZATION WITH CORONARY ANGIOGRAM N/A 10/29/2014   07-14-20- pt denies this Procedure: LEFT HEART CATHETERIZATION WITH CORONARY ANGIOGRAM;  Surgeon: Burnell Blanks, MD;  Location: [facility] CATH LAB;  Service: Cardiovascular;  Laterality: N/A;   MALONEY DILATION N/A 04/30/2016   Procedure: Venia Minks DILATION;  Surgeon: Daneil Dolin, MD;  [facility];  Service: Endoscopy;  Laterality: N/A;   POLYPECTOMY  04/30/2016   Procedure: POLYPECTOMY;  Surgeon: Daneil Dolin, MD;  [facility];  Service: Endoscopy;;  Sigmoid colon polyp removed via hot snare   UPPER GASTROINTESTINAL ENDOSCOPY      There were no vitals filed for this visit.    07/07/21 1021  Symptoms/Limitations  Subjective No new complaints. No falls or pain to report. Saw the Cardiologist and is scheduled for a Peripheral Angiogram on Thursday, September 1.  Pertinent History smoking, PAD, HTN, ETOH abuse, chronic hepatitis C with hepatic coma,GAD, esophageal stricture  Limitations Walking;Standing  Patient Stated Goals wants to work on walking normally  Pain Assessment  Currently in Pain? No/denies  Pain Score 0       07/07/21 1046  Transfers  Transfers Sit to Stand;Stand to  Sit  Sit to Stand 5: Supervision;With upper extremity assist  Stand to Sit 5: Supervision;With upper extremity assist  Ambulation/Gait  Ambulation/Gait Yes  Ambulation/Gait Assistance 5: Supervision;4: Min guard;4: Min assist  Ambulation/Gait Assistance Details supervison to min guard assist as pt fatigued with short distance gait in session. with HHAs performed 115 feet working on increased bil step length, emphasis on heel>toe step progression  with bil feet and faciltation for lateral weight shifting to assist with increased step lenght.  Ambulation Distance (Feet) 115 Feet (x1)  Assistive device None  Gait Pattern Decreased arm swing - right;Step-through pattern;Decreased stance time - right;Decreased hip/knee flexion - right;Decreased dorsiflexion - right;Poor foot clearance - right;Lateral trunk lean to right;Poor foot clearance - left  Ambulation Surface Level;Indoor  Exercises  Exercises Other Exercises  Other Exercises  updated HEP for strengthening and balance. Refer to [place] for full details.      Issued to HEP this session: Access Code: [email or web address]: [email or web address] Date: 07/07/2021 Prepared by: Willow Ora  Exercises Sit to/from Stand in Stride position - 1 x daily - 5 x weekly - 2 sets - 5 reps Seated Knee Extension with Resistance - 1 x daily - 5 x weekly - 1 sets - 10 reps Standing Gastroc Stretch - 2 x daily - 5 x weekly - 2 sets - 30 hold Heel Toe Raises with Counter Support - 1 x daily - 5 x weekly - 1 sets - 10 reps Standing Tandem Balance with Counter Support - 1 x daily - 5 x weekly - 3 sets - 20 hold Standing Single Leg Stance with Counter Support - 1 x daily - 5 x weekly - 1 sets - 3 reps - 10 hold     07/07/21 1046  PT Education  Education Details advanced HEP this session  Person(s) Educated Patient  Methods Explanation;Demonstration;Verbal cues;Handout  Comprehension Returned demonstration;Verbal cues required;Need further instruction         PT Short Term Goals - 06/28/21 0935       PT SHORT TERM GOAL #1   Title Pt will decr TUG time to 14 seconds or less in order to demo decr fall risk. ALL STGS DUE 06/30/21    Baseline 15.93 seconds on 06/06/21, 13.25 seconds on 06/28/21    Time 3    Period Weeks    Status Achieved    Target Date 06/30/21      PT SHORT TERM GOAL #2   Title Pt will undego 2-3 MWT with LTG to be written as appropriate with use of foot up  brace to determine improved gait endurance.    Baseline 3MWT -  360' on 06/15/21 with LTG written.    Time 3    Period Weeks    Status Achieved      PT SHORT TERM GOAL #3   Title Pt will improve gait speed to at least 2.2 ft/sec with no AD vs. LRAD in order to demo decr fall risk.    Baseline last assessed 1.83 ft/sec no AD; 12.63 seconds = 2.59 ft/sec on 06/28/21    Time 3    Period Weeks    Status Achieved               PT Long Term Goals - 06/09/21 1024       PT LONG TERM GOAL #1   Title Pt will be independent with final HEP in order to build upon functional gains made in therapy ALL LTGS DUE 06/13/21  Baseline --    Time 6    Period Weeks    Status New      PT LONG TERM GOAL #2   Title Pt will go ascend and descend 4 steps with single handrail vs. use of LRAD with supervision in order to be able to go to her son's house.    Baseline min guard needed on 06/09/21    Time 6    Period Weeks    Status On-going      PT LONG TERM GOAL #3   Title Pt will decr 5x sit <> stand time to 20 seconds or less in order to demo improved BLE strength and balance.    Baseline 31.47 seconds on 05/09/21 with no UE support; 23.75 seconds from standard height chair on 06/09/21    Time 6    Period Weeks    Status Revised      PT LONG TERM GOAL #4   Title 2-3 MWT goal to be written as appropriate in order to demo improved gait endurance.    Baseline --    Time 6    Period Weeks    Status New      PT LONG TERM GOAL #5   Title --    Baseline --    Time --    Period --    Status --      PT LONG TERM GOAL #6   Title Pt will improve FOTO score to at least 60% in order to demo improved functional outcomes.    Baseline 50%; 52.4% on 06/06/21    Time 6    Period Weeks    Status On-going               07/07/21 1024  Plan  Clinical Impression Statement Today's skilled session focused on progression of pt's HEP as pt reported current HEP getting easy. No issues noted or reported  with performance of HEP in session today. The pt is making progress and should benefit from continued PT to progress toward unmet goals.  Personal Factors and Comorbidities Comorbidity 3+;Past/Current Experience  Comorbidities smoking, PAD, HTN, ETOH abuse, chronic hepatitis C with hepatic coma,GAD, esophageal stricture  Examination-Activity Limitations Locomotion Level;Transfers;Stairs;Squat;Carry  Examination-Participation Restrictions Cleaning;Community Activity;Laundry  Pt will benefit from skilled therapeutic intervention in order to improve on the following deficits Abnormal gait;Decreased activity tolerance;Decreased balance;Decreased coordination;Decreased endurance;Decreased knowledge of use of DME;Difficulty walking;Decreased strength;Dizziness;Impaired sensation;Postural dysfunction  Stability/Clinical Decision Making Stable/Uncomplicated  Rehab Potential Good  PT Frequency 2x / week (12 visits over 8 weeks)  PT Duration 8 weeks (12 visits over 8 weeks)  PT Treatment/Interventions ADLs/Self Care Home Management;DME Instruction;Gait training;Stair training;Therapeutic activities;Functional mobility training;Therapeutic exercise;Balance training;Neuromuscular re-education;Orthotic Fit/Training;Patient/family education;Vestibular  PT Next Visit Plan look at HEP and update (add sit <> stands and any other strengthening). did pt get foot up brace? (could use with either RLE/LLE). weight shifting activities for LLE. NuStep/SciFit for endurance. BLE strength.  PT Home Exercise Plan [email or web address]  Consulted and Agree with Plan of Care Patient         Patient will benefit from skilled therapeutic intervention in order to improve the following deficits and impairments:  Abnormal gait, Decreased activity tolerance, Decreased balance, Decreased coordination, Decreased endurance, Decreased knowledge of use of DME, Difficulty walking, Decreased strength, Dizziness, Impaired sensation, Postural  dysfunction  Visit Diagnosis: Unsteadiness on feet  Muscle weakness (generalized)  Other symptoms and signs involving the nervous system     Problem List Patient Active Problem  List   Diagnosis Date Noted   Acute CVA (cerebrovascular accident) ([place]) 12/29/2020   Peripheral arterial disease ([place]) 12/20/2020   History of esophageal stricture 07/08/2020   Insomnia 12/02/2017   Chronic cough 08/14/2017   Poor appetite 08/29/2016   Peptic stricture of esophagus    History of colonic polyps    Hiatal hernia 03/16/2016   Odynophagia 03/16/2016   Dysphagia 03/16/2016   Alcohol abuse 10/26/2015   Essential hypertension 09/26/2015   PAC (premature atrial contraction) 10/11/2014   PVC (premature ventricular contraction) 10/11/2014   Back muscle spasm 06/15/2014   Generalized anxiety disorder 06/15/2014   Smoking 06/15/2014   Chronic hepatitis C without hepatic coma ([place]) 05/28/2013    Willow Ora 07/10/2021, 8:51 AM  [facility] [address] [place], [place], [address] Phone: [phone]   Fax:  [phone]  Name: Kara Bailey MRN: [id number] Date of Birth: 01/30/1956

## 2021-07-11 ENCOUNTER — Encounter: Payer: Self-pay | Admitting: Physical Therapy

## 2021-07-11 ENCOUNTER — Other Ambulatory Visit: Payer: Self-pay

## 2021-07-11 ENCOUNTER — Ambulatory Visit: Payer: Medicare HMO | Admitting: Physical Therapy

## 2021-07-11 VITALS — BP 142/84 | HR 95

## 2021-07-11 DIAGNOSIS — R29818 Other symptoms and signs involving the nervous system: Secondary | ICD-10-CM

## 2021-07-11 DIAGNOSIS — M6281 Muscle weakness (generalized): Secondary | ICD-10-CM

## 2021-07-11 DIAGNOSIS — R2681 Unsteadiness on feet: Secondary | ICD-10-CM | POA: Diagnosis not present

## 2021-07-11 DIAGNOSIS — I69851 Hemiplegia and hemiparesis following other cerebrovascular disease affecting right dominant side: Secondary | ICD-10-CM

## 2021-07-11 NOTE — Therapy (Addendum)
[facility] [address] [place], [place], [address] Phone: [phone]   Fax:  [phone]  Physical Therapy Treatment/10th Visit Progress Note  Patient Details  Name: Kara Bailey MRN: [id number] Date of Birth: 07/06/1956 Referring Provider (PT): Cipriano Mile, NP  10th Visit Physical Therapy Progress Note  Dates of Reporting Period: 05/26/21 to 07/11/21    Encounter Date: 07/11/2021   PT End of Session - 07/11/21 1059     Visit Number 20    Number of Visits 26    Date for PT Re-Evaluation 08/08/21    Authorization Type [employer] -    Authorization Time Period 16 visits from 67/22- 07/17/21, 12 visits through 06/09/21 - 08/08/21    Authorization - Visit Number 3    Authorization - Number of Visits 12    Progress Note Due on Visit 20    PT Start Time 1021   pt late to session   PT Stop Time 1100    PT Time Calculation (min) 39 min    Equipment Utilized During Treatment Gait belt    Activity Tolerance Patient tolerated treatment well    Behavior During Therapy [facility] for tasks assessed/performed             Past Medical History:  Diagnosis Date   Anxiety    Chest pain 08/2014   unspecified   Cirrhosis ([place])    Depression    Dizziness 08/2014   Dyspnea 08/2014   Emphysema lung (HCC)    GERD (gastroesophageal reflux disease)    Heart murmur    Hepatitis C    Hiatal hernia    Hypertension    PAC (premature atrial contraction) 10/11/2014   PAD (peripheral artery disease) (HCC)    Palpitations    PVC (premature ventricular contraction) 10/11/2014   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   Stroke ([place])    just slurred speech   Tachycardia 08/2014   Ventral hernia    Weakness 08/2014    Past Surgical History:  Procedure Laterality Date   APPENDECTOMY     CATARACT EXTRACTION, BILATERAL     COLONOSCOPY     COLONOSCOPY WITH PROPOFOL N/A 04/30/2016   Procedure: COLONOSCOPY WITH  PROPOFOL;  Surgeon: Daneil Dolin, MD;  [facility];  Service: Endoscopy;  Laterality: N/A;  1230   ESOPHAGOGASTRODUODENOSCOPY     approximately 2010   ESOPHAGOGASTRODUODENOSCOPY (EGD) WITH PROPOFOL N/A 04/30/2016   Procedure: ESOPHAGOGASTRODUODENOSCOPY (EGD) WITH PROPOFOL;  Surgeon: Daneil Dolin, MD;  [facility];  Service: Endoscopy;  Laterality: N/A;   LEFT HEART CATHETERIZATION WITH CORONARY ANGIOGRAM N/A 10/29/2014   07-14-20- pt denies this Procedure: LEFT HEART CATHETERIZATION WITH CORONARY ANGIOGRAM;  Surgeon: Burnell Blanks, MD;  Location: [facility] CATH LAB;  Service: Cardiovascular;  Laterality: N/A;   MALONEY DILATION N/A 04/30/2016   Procedure: Venia Minks DILATION;  Surgeon: Daneil Dolin, MD;  Location: [facility];  Service: Endoscopy;  Laterality: N/A;   POLYPECTOMY  04/30/2016   Procedure: POLYPECTOMY;  Surgeon: Daneil Dolin, MD;  Location: [facility];  Service: Endoscopy;;  Sigmoid colon polyp removed via hot snare   UPPER GASTROINTESTINAL ENDOSCOPY      Vitals:   07/11/21 1025  BP: (!) 142/84  Pulse: 95     Subjective Assessment - 07/11/21 1022     Subjective Had a good weekend. Baked a cake all by herself. No pain.    Pertinent History smoking, PAD, HTN, ETOH abuse,  chronic hepatitis C with hepatic coma,GAD, esophageal stricture    Limitations Walking;Standing    Patient Stated Goals wants to work on walking normally    Currently in Pain? No/denies                               [facility] Adult PT Treatment/Exercise - 07/11/21 1032       Transfers   Transfers Sit to Stand;Stand to Sit    Sit to Stand 4: Min guard;Without upper extremity assist    Stand to Sit 4: Min guard;Without upper extremity assist    Comments from elevated mat table, x5 reps without UE support, pt with incr fatigue afterwards, cues for proper technique and posture in standing      Ambulation/Gait   Ambulation/Gait Yes    Ambulation/Gait  Assistance 5: Supervision;4: Min guard    Ambulation/Gait Assistance Details between activities and into session, cues for heel strike    Assistive device None    Gait Pattern Decreased arm swing - right;Step-through pattern;Decreased stance time - right;Decreased hip/knee flexion - right;Decreased dorsiflexion - right;Poor foot clearance - right;Lateral trunk lean to right;Poor foot clearance - left    Ambulation Surface Level;Indoor                 Balance Exercises - 07/11/21 1042       Balance Exercises: Standing   Standing Eyes Opened Foam/compliant surface    Standing Eyes Opened Limitations feet hip width distance 2 x 10 reps head turns, 2 x 10 reps head nods    Standing Eyes Closed Narrow base of support (BOS);Foam/compliant surface;3 reps;30 secs    Standing Eyes Closed Limitations 3 x 30 seconds on blue air ex    Step Ups Forward;Lateral;4 inch;UE support 2    Step Ups Limitations on aerobic step - alternating step ups with single UE support x10 reps - cues for sequencing, x5 reps B lateral step ups with BUE support, pt with incr difficulty performing with LLE    Step Over Hurdles / Cones stepping over three 2" obstacles next to countertop with single UE support and step to pattern, down and back x3 reps - leading with both RLE and LLE - cues for incr step height and hip/knee flexion when stepping over                 PT Short Term Goals - 06/28/21 0935       PT SHORT TERM GOAL #1   Title Pt will decr TUG time to 14 seconds or less in order to demo decr fall risk. ALL STGS DUE 06/30/21    Baseline 15.93 seconds on 06/06/21, 13.25 seconds on 06/28/21    Time 3    Period Weeks    Status Achieved    Target Date 06/30/21      PT SHORT TERM GOAL #2   Title Pt will undego 2-3 MWT with LTG to be written as appropriate with use of foot up brace to determine improved gait endurance.    Baseline 3MWT -  360' on 06/15/21 with LTG written.    Time 3    Period Weeks     Status Achieved      PT SHORT TERM GOAL #3   Title Pt will improve gait speed to at least 2.2 ft/sec with no AD vs. LRAD in order to demo decr fall risk.    Baseline last assessed 1.83 ft/sec no  AD; 12.63 seconds = 2.59 ft/sec on 06/28/21    Time 3    Period Weeks    Status Achieved               PT Long Term Goals - 07/11/21 1342       PT LONG TERM GOAL #1   Title Pt will be independent with final HEP in order to build upon functional gains made in therapy ALL LTGS DUE 07/14/21    Time 6    Period Weeks    Status New      PT LONG TERM GOAL #2   Title Pt will go ascend and descend 4 steps with single handrail vs. use of LRAD with supervision in order to be able to go to her son's house.    Baseline min guard needed on 06/09/21    Time 6    Period Weeks    Status On-going      PT LONG TERM GOAL #3   Title Pt will decr 5x sit <> stand time to 20 seconds or less in order to demo improved BLE strength and balance.    Baseline 31.47 seconds on 05/09/21 with no UE support; 23.75 seconds from standard height chair on 06/09/21    Time 6    Period Weeks    Status Revised      PT LONG TERM GOAL #4   Title Pt will improve 3MWT distance to at least 410' in order to demo improved walking endurance and gait efficiency.    Baseline 3MWT - 360' on 06/15/21    Time 6    Period Weeks    Status Revised      PT LONG TERM GOAL #6   Title Pt will improve FOTO score to at least 60% in order to demo improved functional outcomes.    Baseline 50%; 52.4% on 06/06/21    Time 6    Period Weeks    Status On-going                   Plan - 07/11/21 1347     Clinical Impression Statement 10th visit progress note: STGs assessed on 06/28/21, pt with improvement of gait speed to 2.59 ft/sec (was 1.83 ft/sec). When fatigued with gait, pt does need min guard at times and demonstrates decr foot clearance with RLE, pt still needs to order foot up brace to assist with foot clearance. Pt needs cues for  proper foot placement for equal weight bearing, continues to perform with RLE posteriorly. Performed from elevated mat table today without UE support with pt needing cues for technique and reported incr fatigue afterwards. HEP updated at last session for balance/strength. Will continue to progress towards LTGs.    Personal Factors and Comorbidities Comorbidity 3+;Past/Current Experience    Comorbidities smoking, PAD, HTN, ETOH abuse, chronic hepatitis C with hepatic coma,GAD, esophageal stricture    Examination-Activity Limitations Locomotion Level;Transfers;Stairs;Squat;Carry    Examination-Participation Restrictions Cleaning;Community Activity;Laundry    Stability/Clinical Decision Making Stable/Uncomplicated    Rehab Potential Good    PT Frequency 2x / week   12 visits over 8 weeks   PT Duration 8 weeks   12 visits over 8 weeks   PT Treatment/Interventions ADLs/Self Care Home Management;DME Instruction;Gait training;Stair training;Therapeutic activities;Functional mobility training;Therapeutic exercise;Balance training;Neuromuscular re-education;Orthotic Fit/Training;Patient/family education;Vestibular    PT Next Visit Plan check LTGs and change date (to 8 weeks). did pt get foot up brace? (could use with either RLE/LLE). weight shifting activities for LLE.  NuStep/SciFit for endurance. BLE strength.    PT Home Exercise Plan [facility]    Consulted and Agree with Plan of Care Patient             Patient will benefit from skilled therapeutic intervention in order to improve the following deficits and impairments:  Abnormal gait, Decreased activity tolerance, Decreased balance, Decreased coordination, Decreased endurance, Decreased knowledge of use of DME, Difficulty walking, Decreased strength, Dizziness, Impaired sensation, Postural dysfunction  Visit Diagnosis: Muscle weakness (generalized)  Unsteadiness on feet  Other symptoms and signs involving the nervous system  Hemiplegia and  hemiparesis following other cerebrovascular disease affecting right dominant side [facility])     Problem List Patient Active Problem List   Diagnosis Date Noted   Acute CVA (cerebrovascular accident) ([place]) 12/29/2020   Peripheral arterial disease ([place]) 12/20/2020   History of esophageal stricture 07/08/2020   Insomnia 12/02/2017   Chronic cough 08/14/2017   Poor appetite 08/29/2016   Peptic stricture of esophagus    History of colonic polyps    Hiatal hernia 03/16/2016   Odynophagia 03/16/2016   Dysphagia 03/16/2016   Alcohol abuse 10/26/2015   Essential hypertension 09/26/2015   PAC (premature atrial contraction) 10/11/2014   PVC (premature ventricular contraction) 10/11/2014   Back muscle spasm 06/15/2014   Generalized anxiety disorder 06/15/2014   Smoking 06/15/2014   Chronic hepatitis C without hepatic coma ([place]) 05/28/2013    Arliss Journey, PT, DPT  07/11/2021, 1:47 PM  [facility] [address] [place], [place], [address] Phone: [phone]   Fax:  [phone]  Name: Kara Bailey MRN: [id number] Date of Birth: Dec 11, 1955

## 2021-07-12 ENCOUNTER — Telehealth: Payer: Self-pay | Admitting: *Deleted

## 2021-07-12 ENCOUNTER — Telehealth: Payer: Self-pay | Admitting: Cardiovascular Disease

## 2021-07-12 NOTE — Telephone Encounter (Addendum)
Abdominal aortogram scheduled at [facility] for: Thursday July 13, 2021 7:30 AM [facility] Main Entrance [facility]) at: 5:30 AM   No solid food after midnight prior to cath, clear liquids until 5 AM day of procedure.  Morning medications can be taken pre-cath with sips of water including aspirin 81 mg.    Confirmed patient has responsible adult to drive home post procedure and be with patient first 24 hours after arriving home.  Patients are allowed one visitor in the waiting room during the time they are at the hospital for their procedure. Both patient and visitor must wear a mask once they enter the hospital.   Patient reports does not currently have any symptoms concerning for COVID-19 and no household members with COVID-19 like illness.      Several unsuccessful attempts to reach patient to discuss procedure instructions.               Left detailed voicemail message with procedure instructions at home phone number listed (DPR).

## 2021-07-12 NOTE — Telephone Encounter (Signed)
Renee from [employer] called about a STAT peer to peer for the patient's Angiogram. Please call and reference Case # [id number]  Dr. Susy Frizzle was the provider that reviewed the case

## 2021-07-12 NOTE — Telephone Encounter (Signed)
Secure chat message sent to provider as well as phone encounter.

## 2021-07-12 NOTE — Telephone Encounter (Signed)
Peer to peer was called and approved. Approval will be faxed over.

## 2021-07-13 ENCOUNTER — Encounter (HOSPITAL_COMMUNITY)
Admission: RE | Disposition: A | Discharge status: Home / Self Care | Payer: Self-pay | Source: Home / Self Care | Attending: Cardiovascular Disease

## 2021-07-13 ENCOUNTER — Other Ambulatory Visit: Payer: Self-pay | Admitting: Cardiology

## 2021-07-13 ENCOUNTER — Ambulatory Visit (HOSPITAL_COMMUNITY)
Admission: RE | Admit: 2021-07-13 | Discharge: 2021-07-13 | Disposition: A | Discharge status: Home / Self Care | Payer: Medicare HMO | Attending: Cardiovascular Disease | Admitting: Cardiovascular Disease

## 2021-07-13 ENCOUNTER — Encounter (HOSPITAL_COMMUNITY): Payer: Self-pay | Admitting: Cardiovascular Disease

## 2021-07-13 ENCOUNTER — Other Ambulatory Visit: Payer: Self-pay

## 2021-07-13 DIAGNOSIS — Z7982 Long term (current) use of aspirin: Secondary | ICD-10-CM | POA: Insufficient documentation

## 2021-07-13 DIAGNOSIS — Z8673 Personal history of transient ischemic attack (TIA), and cerebral infarction without residual deficits: Secondary | ICD-10-CM | POA: Diagnosis not present

## 2021-07-13 DIAGNOSIS — I10 Essential (primary) hypertension: Secondary | ICD-10-CM | POA: Diagnosis not present

## 2021-07-13 DIAGNOSIS — I70212 Atherosclerosis of native arteries of extremities with intermittent claudication, left leg: Secondary | ICD-10-CM | POA: Insufficient documentation

## 2021-07-13 DIAGNOSIS — F17210 Nicotine dependence, cigarettes, uncomplicated: Secondary | ICD-10-CM | POA: Insufficient documentation

## 2021-07-13 DIAGNOSIS — Z79899 Other long term (current) drug therapy: Secondary | ICD-10-CM | POA: Insufficient documentation

## 2021-07-13 HISTORY — PX: ABDOMINAL AORTOGRAM W/LOWER EXTREMITY: CATH118223

## 2021-07-13 HISTORY — PX: PERIPHERAL VASCULAR INTERVENTION: CATH118257

## 2021-07-13 LAB — POCT ACTIVATED CLOTTING TIME: Activated Clotting Time: 289 seconds

## 2021-07-13 SURGERY — ABDOMINAL AORTOGRAM W/LOWER EXTREMITY
Anesthesia: LOCAL

## 2021-07-13 MED ORDER — SODIUM CHLORIDE 0.9 % IV SOLN: 250.0000 mL | INTRAVENOUS | Status: DC | PRN

## 2021-07-13 MED ORDER — CLOPIDOGREL BISULFATE 75 MG PO TABS: 75.0000 mg | ORAL_TABLET | Freq: Every day | ORAL | 1 refills | Status: DC

## 2021-07-13 MED ORDER — CLOPIDOGREL BISULFATE 300 MG PO TABS
ORAL_TABLET | ORAL | Status: AC
  Filled 2021-07-13: qty 1

## 2021-07-13 MED ORDER — ACETAMINOPHEN 325 MG PO TABS: 650.0000 mg | ORAL_TABLET | ORAL | Status: DC | PRN

## 2021-07-13 MED ORDER — SODIUM CHLORIDE 0.9 % WEIGHT BASED INFUSION
3.0000 mL/kg/h | INTRAVENOUS | Status: AC
  Administered 2021-07-13: 3 mL/kg/h via INTRAVENOUS

## 2021-07-13 MED ORDER — IODIXANOL 320 MG/ML IV SOLN
INTRAVENOUS | Status: DC | PRN
  Administered 2021-07-13: 200 mL via INTRA_ARTERIAL

## 2021-07-13 MED ORDER — SODIUM CHLORIDE 0.9 % WEIGHT BASED INFUSION: 1.0000 mL/kg/h | INTRAVENOUS | Status: DC

## 2021-07-13 MED ORDER — LABETALOL HCL 5 MG/ML IV SOLN: 10.0000 mg | INTRAVENOUS | Status: DC | PRN

## 2021-07-13 MED ORDER — ASPIRIN EC 81 MG PO TBEC: 81.0000 mg | DELAYED_RELEASE_TABLET | Freq: Every day | ORAL | Status: DC

## 2021-07-13 MED ORDER — ASPIRIN 81 MG PO CHEW
CHEWABLE_TABLET | ORAL | Status: AC
  Administered 2021-07-13: 81 mg via ORAL
  Filled 2021-07-13: qty 1

## 2021-07-13 MED ORDER — MIDAZOLAM HCL 2 MG/2ML IJ SOLN
INTRAMUSCULAR | Status: AC
  Filled 2021-07-13: qty 2

## 2021-07-13 MED ORDER — SODIUM CHLORIDE 0.9% FLUSH: 3.0000 mL | Freq: Two times a day (BID) | INTRAVENOUS | Status: DC

## 2021-07-13 MED ORDER — MORPHINE SULFATE (PF) 2 MG/ML IV SOLN: 2.0000 mg | INTRAVENOUS | Status: DC | PRN

## 2021-07-13 MED ORDER — HEPARIN (PORCINE) IN NACL 1000-0.9 UT/500ML-% IV SOLN
INTRAVENOUS | Status: DC | PRN
  Administered 2021-07-13 (×2): 500 mL

## 2021-07-13 MED ORDER — SODIUM CHLORIDE 0.9% FLUSH: 3.0000 mL | INTRAVENOUS | Status: DC | PRN

## 2021-07-13 MED ORDER — HYDRALAZINE HCL 20 MG/ML IJ SOLN
5.0000 mg | INTRAMUSCULAR | Status: DC | PRN
Start: 2021-07-13 — End: 2021-07-13

## 2021-07-13 MED ORDER — ONDANSETRON HCL 4 MG/2ML IJ SOLN: 4.0000 mg | Freq: Four times a day (QID) | INTRAMUSCULAR | Status: DC | PRN

## 2021-07-13 MED ORDER — HEPARIN SODIUM (PORCINE) 1000 UNIT/ML IJ SOLN
INTRAMUSCULAR | Status: AC
  Filled 2021-07-13: qty 1

## 2021-07-13 MED ORDER — CLOPIDOGREL BISULFATE 75 MG PO TABS: 75.0000 mg | ORAL_TABLET | Freq: Every day | ORAL | Status: DC

## 2021-07-13 MED ORDER — FENTANYL CITRATE (PF) 100 MCG/2ML IJ SOLN
INTRAMUSCULAR | Status: DC | PRN
  Administered 2021-07-13: 25 ug via INTRAVENOUS

## 2021-07-13 MED ORDER — LIDOCAINE HCL (PF) 1 % IJ SOLN
INTRAMUSCULAR | Status: DC | PRN
  Administered 2021-07-13: 20 mL via SUBCUTANEOUS

## 2021-07-13 MED ORDER — MIDAZOLAM HCL 2 MG/2ML IJ SOLN
INTRAMUSCULAR | Status: DC | PRN
  Administered 2021-07-13: 1 mg via INTRAVENOUS

## 2021-07-13 MED ORDER — CLOPIDOGREL BISULFATE 300 MG PO TABS
ORAL_TABLET | ORAL | Status: AC
  Filled 2021-07-13: qty 2

## 2021-07-13 MED ORDER — CLOPIDOGREL BISULFATE 300 MG PO TABS
ORAL_TABLET | ORAL | Status: DC | PRN
  Administered 2021-07-13: 300 mg via ORAL

## 2021-07-13 MED ORDER — ASPIRIN 81 MG PO CHEW: 81.0000 mg | CHEWABLE_TABLET | ORAL | Status: AC

## 2021-07-13 MED ORDER — FENTANYL CITRATE (PF) 100 MCG/2ML IJ SOLN
INTRAMUSCULAR | Status: AC
  Filled 2021-07-13: qty 2

## 2021-07-13 MED ORDER — SODIUM CHLORIDE 0.9 % IV SOLN: INTRAVENOUS | Status: DC

## 2021-07-13 MED ORDER — HEPARIN SODIUM (PORCINE) 1000 UNIT/ML IJ SOLN
INTRAMUSCULAR | Status: DC | PRN
  Administered 2021-07-13: 8000 [IU] via INTRAVENOUS

## 2021-07-13 MED ORDER — LIDOCAINE HCL (PF) 1 % IJ SOLN
INTRAMUSCULAR | Status: AC
  Filled 2021-07-13: qty 30

## 2021-07-13 SURGICAL SUPPLY — 27 items
BALLN CHOCOLATE 4.0X40X135 (BALLOONS) ×3
BALLN IN.PACT DCB 5X60 (BALLOONS) ×3
BALLN STERLING OTW 5X60X135 (BALLOONS) ×3
BALLN STERLING OTW 6X40X135 (BALLOONS) ×3
BALLOON CHOCOLATE 4.0X40X135 (BALLOONS) IMPLANT
BALLOON STERLING OTW 5X60X135 (BALLOONS) IMPLANT
BALLOON STERLING OTW 6X40X135 (BALLOONS) IMPLANT
CATH ANGIO 5F PIGTAIL 65CM (CATHETERS) ×1 IMPLANT
CATH CROSS OVER TEMPO 5F (CATHETERS) ×1 IMPLANT
CLOSURE MYNX CONTROL 6F/7F (Vascular Products) ×1 IMPLANT
DCB IN.PACT 5X60 (BALLOONS) IMPLANT
GUIDEWIRE ZILIENT 6G 018 (WIRE) ×1 IMPLANT
KIT ENCORE 26 ADVANTAGE (KITS) ×2 IMPLANT
KIT PV (KITS) ×3 IMPLANT
SHEATH HIGHFLEX ANSEL 6FRX55 (SHEATH) ×1 IMPLANT
SHEATH PINNACLE 5F 10CM (SHEATH) ×1 IMPLANT
SHEATH PINNACLE 6F 10CM (SHEATH) ×1 IMPLANT
STENT ABSOLUTE PRO 6X80X135 (Permanent Stent) ×1 IMPLANT
STENT ELUVIA 7X40X130 (Permanent Stent) ×1 IMPLANT
STOPCOCK MORSE 400PSI 3WAY (MISCELLANEOUS) ×1 IMPLANT
SYR MEDRAD MARK 7 150ML (SYRINGE) ×3 IMPLANT
TAPE VIPERTRACK RADIOPAQ (MISCELLANEOUS) IMPLANT
TAPE VIPERTRACK RADIOPAQUE (MISCELLANEOUS) ×3
TRANSDUCER W/STOPCOCK (MISCELLANEOUS) ×3 IMPLANT
TRAY PV CATH (CUSTOM PROCEDURE TRAY) ×3 IMPLANT
WIRE HITORQ VERSACORE ST 145CM (WIRE) ×1 IMPLANT
WIRE ZILIENT 014 4G (WIRE) ×1 IMPLANT

## 2021-07-13 NOTE — Interval H&P Note (Signed)
History and Physical Interval Note:  07/13/2021 7:29 AM  Kara Bailey  has presented today for surgery, with the diagnosis of pad.  The various methods of treatment have been discussed with the patient and family. After consideration of risks, benefits and other options for treatment, the patient has consented to  Procedure(s): ABDOMINAL AORTOGRAM W/LOWER EXTREMITY (N/A) as a surgical intervention.  The patient's history has been reviewed, patient examined, no change in status, stable for surgery.  I have reviewed the patient's chart and labs.  Questions were answered to the patient's satisfaction.     Quay Burow

## 2021-07-13 NOTE — Progress Notes (Signed)
Per review of the chart-patient received a left external iliac stent. Received a plavix load of '300mg'$ x1 in the lab. Does appear that she was written for plavix '75mg'$  daily at discharge, no Rx at the pharmacy. Discussed with discharging RN and informed no Rx was reviewed. I sent Rx for plavix 27mdaily to pharmacy of choice and called the patient to discuss the medication and need to pick up to start in am. Patient voiced understanding.

## 2021-07-13 NOTE — Progress Notes (Signed)
Patient was given discharge instructions. She verbalized understanding. 

## 2021-07-14 ENCOUNTER — Ambulatory Visit: Payer: Medicare HMO

## 2021-07-14 ENCOUNTER — Ambulatory Visit: Payer: Medicare HMO | Admitting: Physical Therapy

## 2021-07-14 ENCOUNTER — Other Ambulatory Visit: Payer: Self-pay | Admitting: Cardiovascular Disease

## 2021-07-14 DIAGNOSIS — I739 Peripheral vascular disease, unspecified: Secondary | ICD-10-CM

## 2021-07-18 ENCOUNTER — Ambulatory Visit: Payer: Medicare HMO | Admitting: Physical Therapy

## 2021-07-18 ENCOUNTER — Ambulatory Visit: Payer: Medicare HMO

## 2021-07-18 ENCOUNTER — Telehealth: Payer: Self-pay | Admitting: Cardiovascular Disease

## 2021-07-18 NOTE — Telephone Encounter (Signed)
Message routed to Dr. Gwenlyn Found regarding request for PT

## 2021-07-18 NOTE — Telephone Encounter (Signed)
Patient states she can't go back to physical therapy until they receive an okay from Dr. [name].

## 2021-07-19 NOTE — Telephone Encounter (Signed)
Left message to call back  

## 2021-07-20 ENCOUNTER — Ambulatory Visit (HOSPITAL_BASED_OUTPATIENT_CLINIC_OR_DEPARTMENT_OTHER)
Admission: RE | Admit: 2021-07-20 | Discharge: 2021-07-20 | Disposition: A | Discharge status: Home / Self Care | Payer: Medicare HMO | Source: Ambulatory Visit | Attending: Cardiovascular Disease | Admitting: Cardiovascular Disease

## 2021-07-20 ENCOUNTER — Other Ambulatory Visit: Payer: Self-pay

## 2021-07-20 ENCOUNTER — Ambulatory Visit (HOSPITAL_COMMUNITY)
Admission: RE | Admit: 2021-07-20 | Discharge: 2021-07-20 | Disposition: A | Discharge status: Home / Self Care | Payer: Medicare HMO | Source: Ambulatory Visit | Attending: Internal Medicine | Admitting: Internal Medicine

## 2021-07-20 DIAGNOSIS — I739 Peripheral vascular disease, unspecified: Secondary | ICD-10-CM | POA: Diagnosis not present

## 2021-07-20 DIAGNOSIS — Z95820 Peripheral vascular angioplasty status with implants and grafts: Secondary | ICD-10-CM | POA: Diagnosis not present

## 2021-07-21 ENCOUNTER — Other Ambulatory Visit: Payer: Self-pay

## 2021-07-21 ENCOUNTER — Ambulatory Visit: Payer: Medicare HMO | Attending: Student

## 2021-07-21 ENCOUNTER — Ambulatory Visit: Payer: Medicare HMO | Admitting: Physical Therapy

## 2021-07-21 DIAGNOSIS — R4701 Aphasia: Secondary | ICD-10-CM | POA: Insufficient documentation

## 2021-07-21 DIAGNOSIS — R471 Dysarthria and anarthria: Secondary | ICD-10-CM | POA: Insufficient documentation

## 2021-07-21 DIAGNOSIS — R41841 Cognitive communication deficit: Secondary | ICD-10-CM | POA: Insufficient documentation

## 2021-07-21 DIAGNOSIS — R482 Apraxia: Secondary | ICD-10-CM | POA: Insufficient documentation

## 2021-07-21 DIAGNOSIS — I739 Peripheral vascular disease, unspecified: Secondary | ICD-10-CM

## 2021-07-21 NOTE — Therapy (Signed)
[facility] [address] [place], [place], [address] Phone: [phone]   Fax:  [phone]  Speech Language Pathology Treatment/Progress Note  Patient Details  Name: Kara Bailey MRN: [id number] Date of Birth: 09/17/1956 Referring Provider (SLP): Cipriano Mile, NP   Encounter Date: 07/21/2021   End of Session - 07/21/21 1155     Visit Number 20    Number of Visits 31    Date for SLP Re-Evaluation 08/04/21    Authorization Type [employer]    Authorization Time Period 16 visits 06-09-21 to 08-04-21    Authorization - Visit Number 4    Authorization - Number of Visits 16    SLP Start Time 0156    SLP Stop Time  1537    SLP Time Calculation (min) 43 min    Activity Tolerance Patient tolerated treatment well             Past Medical History:  Diagnosis Date   Anxiety    Chest pain 08/2014   unspecified   Cirrhosis ([facility])    Depression    Dizziness 08/2014   Dyspnea 08/2014   Emphysema lung (HCC)    GERD (gastroesophageal reflux disease)    Heart murmur    Hepatitis C    Hiatal hernia    Hypertension    PAC (premature atrial contraction) 10/11/2014   PAD (peripheral artery disease) ([facility])    Palpitations    PVC (premature ventricular contraction) 10/11/2014   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   Stroke ([place])    just slurred speech   Tachycardia 08/2014   Ventral hernia    Weakness 08/2014    Past Surgical History:  Procedure Laterality Date   ABDOMINAL AORTOGRAM W/LOWER EXTREMITY N/A 07/13/2021   Procedure: ABDOMINAL AORTOGRAM W/LOWER EXTREMITY;  Surgeon: Lorretta Harp, MD;  Location: [facility];  Service: Cardiovascular;  Laterality: N/A;   APPENDECTOMY     CATARACT EXTRACTION, BILATERAL     COLONOSCOPY     COLONOSCOPY WITH PROPOFOL N/A 04/30/2016   Procedure: COLONOSCOPY WITH PROPOFOL;  Surgeon: Daneil Dolin, MD;  [facility];  Service: Endoscopy;   Laterality: N/A;  1230   ESOPHAGOGASTRODUODENOSCOPY     approximately 2010   ESOPHAGOGASTRODUODENOSCOPY (EGD) WITH PROPOFOL N/A 04/30/2016   Procedure: ESOPHAGOGASTRODUODENOSCOPY (EGD) WITH PROPOFOL;  Surgeon: Daneil Dolin, MD;  [facility];  Service: Endoscopy;  Laterality: N/A;   LEFT HEART CATHETERIZATION WITH CORONARY ANGIOGRAM N/A 10/29/2014   07-14-20- pt denies this Procedure: LEFT HEART CATHETERIZATION WITH CORONARY ANGIOGRAM;  Surgeon: Burnell Blanks, MD;  Location: [facility];  Service: Cardiovascular;  Laterality: N/A;   MALONEY DILATION N/A 04/30/2016   Procedure: Venia Minks DILATION;  Surgeon: Daneil Dolin, MD;  [facility];  Service: Endoscopy;  Laterality: N/A;   PERIPHERAL VASCULAR INTERVENTION  07/13/2021   Procedure: PERIPHERAL VASCULAR INTERVENTION;  Surgeon: Lorretta Harp, MD;  Location: [facility];  Service: Cardiovascular;;  left SFA left external iliac   POLYPECTOMY  04/30/2016   Procedure: POLYPECTOMY;  Surgeon: Daneil Dolin, MD;  [facility]: [facility];  Service: Endoscopy;;  Sigmoid colon polyp removed via hot snare   UPPER GASTROINTESTINAL ENDOSCOPY      There were no vitals filed for this visit.   Subjective Assessment - 07/21/21 1104     Subjective "my mouth stays dry"    Currently in Pain? No/denies  Speech Therapy Progress Note  Dates of Reporting Period: 04-14-21 to current  Objective Reports of Subjective Statement: Pt reports some subjective improvements in speech clarity and swallow function since initiation of ST services.   Objective Measurements: Pt presents and reports improvements in swallow function with use of trained compensations. Pt exhibits some minimal improvements in speech related to apraxia impacting coordination of breath support and suspected laryngeal tension. Pt required additional education to utilize external memory aids, with SLP optimizing process to use phone as external  aid more effectively.   Goal Update: see goals below  Plan: continue per POC  Reason Skilled Services are Required: pt has not yet met rehab potential so continue skilled ST intervention to maximize outcomes      ADULT SLP TREATMENT - 07/21/21 1105       General Information   Behavior/Cognition Alert;Cooperative;Pleasant mood      Treatment Provided   Treatment provided Cognitive-Linquistic      Cognitive-Linquistic Treatment   Treatment focused on Apraxia;Dysarthria;Aphasia    Skilled Treatment Pt reports speech is the about the same, which pt related to dry mouth. Pt revealed relevation of needing to "form mouth" in sense of rounding lips to aid flow sound phonation. Pt reports swallowing has improved and some continued trouble with recall. SLP reviewed use of external aids within phone to aid recall, including use of calendar and notes section. SLP targeted speech intelligiblity for using mircophone for texting, in which occasional fading to rare verbal cues required to slow rate of speech and enunciate to optimize speech intelligibility.      Assessment / Recommendations / Plan   Plan Continue with current plan of care      Progression Toward Goals   Progression toward goals Progressing toward goals              SLP Education - 07/21/21 1153     Education Details flow versus pressed speech, slow rate, over-enunciation    Person(s) Educated Patient    Methods Explanation;Demonstration;Verbal cues    Comprehension Verbalized understanding;Returned demonstration;Tactile cues required              SLP Short Term Goals - 07/21/21 1156       SLP SHORT TERM GOAL #1   Title Pt will demonstrate dysarthria compensations in 10 minute simple to mod complex conversation with min A over 2 sessions    Status Not Met      SLP SHORT TERM GOAL #2   Title Pt will use memory/attention compensations for appointments, medicine management, and other daily activities with  occasional min A over 3 sessions.    Baseline 05-12-21    Status Partially Met      SLP SHORT TERM GOAL #3   Title Pt will ID and correct word finding errors with 80% accuracy given occasional min A over 2 sessions    Baseline 05-12-21    Status Partially Met      SLP SHORT TERM GOAL #4   Title Pt will verbalize 3 safety concerns in the home and generate solutions to them with occasional min A over 2 sessions    Baseline 05-12-21    Status Partially Met      SLP SHORT TERM GOAL #5   Title Pt will complete dysphagia HEP to optimize swallow function with occasional min A over 2 sessions    Status Not Met      SLP SHORT TERM GOAL #6   Title Pt will exhibit improved oral  coordination on targeted apraxia tasks with occasional min A over 2 sessions    Status Not Met      SLP SHORT TERM GOAL #7   Title Pt will generate abdominal breathing in 16/20 answers to SLP questions over 2 sessions    Status Not Met    Target Date 07/07/21      SLP SHORT TERM GOAL #8   Title Pt will be 90% intelligible in response to open ended SLP questions with rare min A over 2 sessions    Status Partially Met    Target Date 07/07/21              SLP Long Term Goals - 07/21/21 1156       SLP LONG TERM GOAL #1   Title Pt will demonstrate dysarthria compensations in 10 minute simple to mod complex conversation with occasional min A over 2 sessions    Time 8    Period Weeks    Status Revised    Target Date 08/04/21      SLP LONG TERM GOAL #2   Title Pt will use memory/attention compensations for appointments, medicine management, and other daily activities with occasional min A over 3 sessions.    Time 8    Period Weeks    Status On-going    Target Date 08/04/21      SLP LONG TERM GOAL #3   Title Pt will ID and correct word finding errors with 95% accuracy given occasional min A over 2 sessions    Status Partially Met      SLP LONG TERM GOAL #4   Title Pt will report improved diet tolerance and  reduced s/sx of aspiration with rare min over 4 sessions    Baseline 06-09-21, 07-21-21    Time 8    Period Weeks    Status On-going    Target Date 08/04/21      SLP LONG TERM GOAL #5   Title Pt will undergo repeat objective swallow study as needed if s/sx of aspiration persist or change in respiration indicated    Time 8    Period Weeks    Status On-going    Target Date 08/04/21      SLP LONG TERM GOAL #6   Title Pt will generate abdominal breathing throughout 10 minute conversation occasional min A over 2 sessions    Time 8    Period Weeks    Status On-going    Target Date 08/04/21              Plan - 07/21/21 1155     Clinical Impression Statement Kara Bailey cont to present with changes in speech, cognitive communication, and swallowing s/p CVA in February 2022. Ongoing education and training provided to maximize and coordinate breath support to address verbal apraxia and dysarthria. Some progress exhibited to utilize abdominal breathing and flow phonation to maximize breath support and reduce laryngeal tension. SLP targeted speech intelligibility tasks for speaking into microphone for texting. Occasional fading to rare cues needed to use slow rate and pause to breathe during speech tasks. Skilled ST is recommended to address speech, cognitive communication, and dysphagia impacting pt's overall safety and functional independence compared to baseline.    Speech Therapy Frequency 2x / week    Duration 8 weeks    Treatment/Interventions Aspiration precaution training;Oral motor exercises;Compensatory strategies;Pharyngeal strengthening exercises;Functional tasks;Patient/family education;Diet toleration management by SLP;Cognitive reorganization;Multimodal communcation approach;Language facilitation;Compensatory techniques;Internal/external aids;SLP instruction and feedback    Potential to Achieve  Goals Fair    Potential Considerations Ability to learn/carryover information;Severity of  impairments    SLP Home Exercise Plan provided    Consulted and Agree with Plan of Care Patient             Patient will benefit from skilled therapeutic intervention in order to improve the following deficits and impairments:   Dysarthria and anarthria  Cognitive communication deficit    Problem List Patient Active Problem List   Diagnosis Date Noted   Acute CVA (cerebrovascular accident) ([facility]) 12/29/2020   Peripheral arterial disease ([facility]) 12/20/2020   History of esophageal stricture 07/08/2020   Insomnia 12/02/2017   Chronic cough 08/14/2017   Poor appetite 08/29/2016   Peptic stricture of esophagus    History of colonic polyps    Hiatal hernia 03/16/2016   Odynophagia 03/16/2016   Dysphagia 03/16/2016   Alcohol abuse 10/26/2015   Essential hypertension 09/26/2015   PAC (premature atrial contraction) 10/11/2014   PVC (premature ventricular contraction) 10/11/2014   Back muscle spasm 06/15/2014   Generalized anxiety disorder 06/15/2014   Smoking 06/15/2014   Chronic hepatitis C without hepatic coma ([facility]) 05/28/2013    Alinda Deem, MA CCC-SLP 07/21/2021, 12:03 PM  [name] [address] [place], [place], [address] Phone: [phone]   Fax:  [phone]   Name: Kara Bailey MRN: [id number] Date of Birth: [birth year]

## 2021-07-25 ENCOUNTER — Ambulatory Visit: Payer: Medicare HMO | Admitting: Physical Therapy

## 2021-07-28 ENCOUNTER — Ambulatory Visit: Payer: Medicare HMO

## 2021-07-28 ENCOUNTER — Ambulatory Visit: Payer: Medicare HMO | Admitting: Physical Therapy

## 2021-07-28 ENCOUNTER — Telehealth: Payer: Self-pay

## 2021-07-28 NOTE — Telephone Encounter (Signed)
   [facility] Pre-operative Risk Assessment    Patient Name: Kara Bailey  DOB: 1956-01-16 MRN: [id number]   Request for surgical clearance:  What type of surgery is being performed? HERNIA  When is this surgery scheduled? TBD  What type of clearance is required (medical clearance vs. Pharmacy clearance to hold med vs. Both)? BOTH  Are there any medications that need to be held prior to surgery and how long? ELIQUIS & ASA  Practice name and name of physician performing surgery? [facility] Harlow Asa ATTN:KELLY  What is the office phone number? [phone]   7.   What is the office fax number? [phone]  8.   Anesthesia type (None, local, MAC, general) ? GENERAL   Waylan Rocher 07/28/2021, 8:02 AM

## 2021-07-31 NOTE — Telephone Encounter (Signed)
Pt not on any anticoag, just on antiplatelets (aspirin and Plavix). Preop team to address.

## 2021-07-31 NOTE — Telephone Encounter (Signed)
Patient received iliac stent on 07/13/2021.  Recommendations are to continue dual antiplatelet therapy for 12 months without interruption.  She will not be eligible for procedure/surgery until 07/13/2022.

## 2021-07-31 NOTE — Telephone Encounter (Signed)
Notes have been faxed to the surgeon with the recommendations from pre op provider. See notes.

## 2021-08-01 ENCOUNTER — Ambulatory Visit: Payer: Medicare HMO | Admitting: Physical Therapy

## 2021-08-04 ENCOUNTER — Ambulatory Visit: Payer: Medicare HMO | Admitting: Physical Therapy

## 2021-08-07 NOTE — Telephone Encounter (Signed)
Pt has appt 08/09/21 with Dr. Gwenlyn Found. I will forward notes to Dr. Gwenlyn Found as Juluis Rainier to see notes from Dr. Harlow Asa as well.

## 2021-08-08 ENCOUNTER — Ambulatory Visit: Payer: Medicare HMO | Admitting: Physical Therapy

## 2021-08-08 NOTE — Telephone Encounter (Signed)
Attempted to call patient, left message for patient to call back to office if needed.

## 2021-08-08 NOTE — Telephone Encounter (Signed)
Will close this encounter.  

## 2021-08-09 ENCOUNTER — Encounter: Payer: Self-pay | Admitting: Cardiovascular Disease

## 2021-08-09 ENCOUNTER — Ambulatory Visit (INDEPENDENT_AMBULATORY_CARE_PROVIDER_SITE_OTHER): Payer: Medicare HMO | Admitting: Cardiovascular Disease

## 2021-08-09 ENCOUNTER — Other Ambulatory Visit: Payer: Self-pay

## 2021-08-09 ENCOUNTER — Ambulatory Visit: Payer: Medicare HMO

## 2021-08-09 VITALS — BP 138/86 | HR 107 | Ht 66.0 in | Wt 151.6 lb

## 2021-08-09 DIAGNOSIS — R41841 Cognitive communication deficit: Secondary | ICD-10-CM

## 2021-08-09 DIAGNOSIS — R471 Dysarthria and anarthria: Secondary | ICD-10-CM | POA: Diagnosis not present

## 2021-08-09 DIAGNOSIS — R4701 Aphasia: Secondary | ICD-10-CM

## 2021-08-09 DIAGNOSIS — R482 Apraxia: Secondary | ICD-10-CM

## 2021-08-09 DIAGNOSIS — E782 Mixed hyperlipidemia: Secondary | ICD-10-CM | POA: Diagnosis not present

## 2021-08-09 DIAGNOSIS — I739 Peripheral vascular disease, unspecified: Secondary | ICD-10-CM

## 2021-08-09 NOTE — Patient Instructions (Addendum)
  Fill up your lungs before you speak and let it flow   Keep it simple and short.   Communication techniques: eye contact, face your listener, turn down background noise Don't try to talk to someone in the other room

## 2021-08-09 NOTE — Telephone Encounter (Signed)
Per Dr. Gwenlyn Found pt can have hernia repair surgery after being on DAPT for 3 full months. After 10/12/21 surgery could be scheduled.

## 2021-08-09 NOTE — Progress Notes (Signed)
08/09/2021 Kara Bailey   [birth year]  [id number]  Primary Physician Andree Moro, DO Primary Cardiologist: Lorretta Harp MD [name], [place]  HPI:  Kara Bailey is a 65 y.o.  mildly overweight divorced African-American female mother of 2, grandmother of 47 grandchildren referred by Dr. Audie Box, her cardiologist, for peripheral vascular evaluation.  She is retired from doing factory work.  I last saw her in the office 07/05/2021. Risk factors include smoking up to 1/2 pack a day for the last 50 years and treated hypertension.  There is no family history.  She is never had heart attack or stroke.  Denies chest pain or shortness of breath.  She complains of left calf lifestyle of any claudication which she has had for several years.  Recent Doppler studies performed 11/03/2020 revealed a right ABI 1.0 and a left of 0.89 with moderate disease noted in the mid left SFA   She had suffered a stroke when I saw her last and her peripheral vascular work-up was put on hold.  Her neurologic deficits have since resolved except for dysarthria and she is participating physical therapy.  She does continue to smoke however.  She tells me that her right leg is "tighter than her left but really denies claudication type symptoms.  Interestingly, her right ABI is normal on her left is 0.89.  She has no obstructive disease for the most part on the right side with mild to moderate disease in her left SFA.  She continues to do outpatient rehab/physical therapy.  She is walking much better but continues to complain of left calf claudication.  She wishes to proceed with outpatient peripheral angiography and endovascular therapy.  I performed abdominal aortography with bifemoral runoff and endovascular therapy 07/13/2021.  I stented her left extrailiac artery and mid left SFA with excellent result.  She was discharged home the same day.  Follow-up Dopplers performed 07/20/2021 revealed a left ABI of 1.02 with a  patent stent in her external iliac artery and SFA.  She did appear to have slightly increased velocities in her distal common femoral but there was no disease there on angiography.  Her claudication has improved as well.  She wishes to go back to rehab.  She also has an umbilical hernia and hernia which apparently needs to be surgically fixed although I prefer her to have uninterrupted DAPT for at least 3 months.   Current Meds  Medication Sig   albuterol (VENTOLIN HFA) 108 (90 Base) MCG/ACT inhaler Inhale 2 puffs into the lungs every 6 (six) hours as needed for shortness of breath or wheezing.   aspirin EC 81 MG EC tablet Take 1 tablet (81 mg total) by mouth daily. Swallow whole.   atorvastatin (LIPITOR) 80 MG tablet Take 1 tablet (80 mg total) by mouth daily. (Patient taking differently: Take 80 mg by mouth at bedtime.)   busPIRone (BUSPAR) 15 MG tablet Take 1 tablet (15 mg total) by mouth 2 (two) times daily.   clopidogrel (PLAVIX) 75 MG tablet Take 1 tablet (75 mg total) by mouth daily.   cyclobenzaprine (FLEXERIL) 10 MG tablet Take 10 mg by mouth daily as needed for muscle spasms (Back pain).   diltiazem (TIAZAC) 180 MG 24 hr capsule Take 180 mg by mouth in the morning.   escitalopram (LEXAPRO) 20 MG tablet Take 20 mg by mouth in the morning.   famotidine (PEPCID) 40 MG tablet Take 40 mg by mouth at bedtime.   hydrOXYzine (  ATARAX/VISTARIL) 50 MG tablet Take 50 mg by mouth 3 (three) times daily as needed for anxiety.   mirtazapine (REMERON) 30 MG tablet Take 0.5 tablets (15 mg total) by mouth at bedtime. (Patient taking differently: Take 30 mg by mouth at bedtime.)   Multiple Vitamin (MULTIVITAMIN WITH MINERALS) TABS tablet Take 1 tablet by mouth daily.   nicotine (NICODERM CQ - DOSED IN MG/24 HOURS) 21 mg/24hr patch Place 1 patch (21 mg total) onto the skin daily. (Patient taking differently: Place 21 mg onto the skin daily as needed.)   pantoprazole (PROTONIX) 40 MG tablet Take 1 tablet (40  mg total) by mouth daily. (Patient taking differently: Take 40 mg by mouth in the morning.)   thiamine 100 MG tablet Take 1 tablet (100 mg total) by mouth daily. (Patient taking differently: Take 100 mg by mouth in the morning.)   Current Facility-Administered Medications for the 08/09/21 encounter (Office Visit) with Lorretta Harp, MD  Medication   sodium chloride flush (NS) 0.9 % injection 3 mL     No Known Allergies  Social History   Socioeconomic History   Marital status: Divorced    Spouse name: Not on file   Number of children: 2   Years of education: Not on file   Highest education level: Not on file  Occupational History   Occupation: retired  Tobacco Use   Smoking status: Every Day    Packs/day: 0.50    Years: 40.00    Pack years: 20.00    Types: Cigarettes    Start date: 11/12/1968   Smokeless tobacco: Never   Tobacco comments:    cutting back, wearing patches  Vaping Use   Vaping Use: Never used  Substance and Sexual Activity   Alcohol use: Yes    Alcohol/week: 0.0 standard drinks    Comment: beer on weekends (6-pack)   Drug use: No   Sexual activity: Not Currently  Other Topics Concern   Not on file  Social History Narrative   Not on file   Social Determinants of Health   Financial Resource Strain: Not on file  Food Insecurity: Not on file  Transportation Needs: Not on file  Physical Activity: Not on file  Stress: Not on file  Social Connections: Not on file  Intimate Partner Violence: Not on file     Review of Systems: General: negative for chills, fever, night sweats or weight changes.  Cardiovascular: negative for chest pain, dyspnea on exertion, edema, orthopnea, palpitations, paroxysmal nocturnal dyspnea or shortness of breath Dermatological: negative for rash Respiratory: negative for cough or wheezing Urologic: negative for hematuria Abdominal: negative for nausea, vomiting, diarrhea, bright red blood per rectum, melena, or  hematemesis Neurologic: negative for visual changes, syncope, or dizziness All other systems reviewed and are otherwise negative except as noted above.    Blood pressure 138/86, pulse (!) 107, height 5\' 6"  (1.676 m), weight 151 lb 9.6 oz (68.8 kg), SpO2 94 %.  General appearance: alert and no distress Neck: no adenopathy, no carotid bruit, no JVD, supple, symmetrical, trachea midline, and thyroid not enlarged, symmetric, no tenderness/mass/nodules Lungs: clear to auscultation bilaterally Heart: regular rate and rhythm, S1, S2 normal, no murmur, click, rub or gallop  EKG sinus tachycardia 1067 with nonspecific ST and T wave changes, borderline LVH voltage with left atrial enlargement.  I personally reviewed this EKG.  ASSESSMENT AND PLAN:   Peripheral arterial disease ([place]) Ms. Shumard returns today for follow-up of her recent outpatient endovascular procedure which  I performed on 07/13/2021.  She was originally referred to me by Dr. Audie Box.  I stented her left extrailiac artery and mid left SFA.  She had three-vessel runoff.  She did have a 75% focal proximal right SFA stenosis with two-vessel runoff.  Her claudication improved as did her Doppler studies.  She is on dual antiplatelet therapy.  She apparently needs a umbilical hernia repair and can interrupt her antiplatelet therapy after 3 months.  We will recheck lower extremity arterial Dopplers in 6 months.     Lorretta Harp MD FACP,FACC,FAHA, [facility] 08/09/2021 9:32 AM

## 2021-08-09 NOTE — Assessment & Plan Note (Signed)
Ms. Dobos returns today for follow-up of her recent outpatient endovascular procedure which I performed on 07/13/2021.  She was originally referred to me by Dr. Audie Box.  I stented her left extrailiac artery and mid left SFA.  She had three-vessel runoff.  She did have a 75% focal proximal right SFA stenosis with two-vessel runoff.  Her claudication improved as did her Doppler studies.  She is on dual antiplatelet therapy.  She apparently needs a umbilical hernia repair and can interrupt her antiplatelet therapy after 3 months.  We will recheck lower extremity arterial Dopplers in 6 months.

## 2021-08-09 NOTE — Therapy (Signed)
[facility] [address] [place], [place], [address] Phone: [phone]   Fax:  [phone]  Speech Language Pathology Treatment/Recert  Patient Details  Name: Kara Bailey MRN: [id number] Date of Birth: [birth year] Referring Provider (SLP): Cipriano Mile, NP   Encounter Date: 08/09/2021   End of Session - 08/09/21 1103     Visit Number 21    Number of Visits 31    Date for SLP Re-Evaluation 09/08/21   extended 2x/week for 4 weeks   Authorization Type [employer]    SLP Start Time 1100    SLP Stop Time  8675    SLP Time Calculation (min) 45 min    Activity Tolerance Patient tolerated treatment well             Past Medical History:  Diagnosis Date   Anxiety    Chest pain 08/2014   unspecified   Cirrhosis ([facility])    Depression    Dizziness 08/2014   Dyspnea 08/2014   Emphysema lung (HCC)    GERD (gastroesophageal reflux disease)    Heart murmur    Hepatitis C    Hiatal hernia    Hypertension    PAC (premature atrial contraction) 10/11/2014   PAD (peripheral artery disease) (HCC)    Palpitations    PVC (premature ventricular contraction) 10/11/2014   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   Stroke ([place])    just slurred speech   Tachycardia 08/2014   Ventral hernia    Weakness 08/2014    Past Surgical History:  Procedure Laterality Date   ABDOMINAL AORTOGRAM W/LOWER EXTREMITY N/A 07/13/2021   Procedure: ABDOMINAL AORTOGRAM W/LOWER EXTREMITY;  Surgeon: Lorretta Harp, MD;  Location: [facility];  Service: Cardiovascular;  Laterality: N/A;   APPENDECTOMY     CATARACT EXTRACTION, BILATERAL     COLONOSCOPY     COLONOSCOPY WITH PROPOFOL N/A 04/30/2016   Procedure: COLONOSCOPY WITH PROPOFOL;  Surgeon: Daneil Dolin, MD;  [facility];  Service: Endoscopy;  Laterality: N/A;  1230   ESOPHAGOGASTRODUODENOSCOPY     approximately 2010   ESOPHAGOGASTRODUODENOSCOPY (EGD)  WITH PROPOFOL N/A 04/30/2016   Procedure: ESOPHAGOGASTRODUODENOSCOPY (EGD) WITH PROPOFOL;  Surgeon: Daneil Dolin, MD;  [facility];  Service: Endoscopy;  Laterality: N/A;   LEFT HEART CATHETERIZATION WITH CORONARY ANGIOGRAM N/A 10/29/2014   07-14-20- pt denies this Procedure: LEFT HEART CATHETERIZATION WITH CORONARY ANGIOGRAM;  Surgeon: Burnell Blanks, MD;  Location: [facility] CATH LAB;  Service: Cardiovascular;  Laterality: N/A;   MALONEY DILATION N/A 04/30/2016   Procedure: Venia Minks DILATION;  Surgeon: Daneil Dolin, MD;  [facility];  Service: Endoscopy;  Laterality: N/A;   PERIPHERAL VASCULAR INTERVENTION  07/13/2021   Procedure: PERIPHERAL VASCULAR INTERVENTION;  Surgeon: Lorretta Harp, MD;  Location: [facility];  Service: Cardiovascular;;  left SFA left external iliac   POLYPECTOMY  04/30/2016   Procedure: POLYPECTOMY;  Surgeon: Daneil Dolin, MD;  [facility];  Service: Endoscopy;;  Sigmoid colon polyp removed via hot snare   UPPER GASTROINTESTINAL ENDOSCOPY      There were no vitals filed for this visit.   Subjective Assessment - 08/09/21 1102     Subjective "going backwards" re: speech   Currently in Pain? No/denies                   ADULT SLP TREATMENT - 08/09/21 1106       General  Information   Behavior/Cognition Alert;Cooperative;Pleasant mood      Treatment Provided   Treatment provided Cognitive-Linquistic      Cognitive-Linquistic Treatment   Treatment focused on Apraxia;Dysarthria;Aphasia    Skilled Treatment Pt returned to ST after a few week gap following procedure. MD provided clearance today. Pt indicates speech has gotten "worse" with c/o family and friends being unable to understand her. SLP reviewed previous recommendations, which has been inconsistently effective throughout course of ST intervention. SLP suspects apraxia has and will continue to be limiting factor in progress, as pt continues with exhibit  halting speech, abnormal speech rate, and poor breath support. Limited carryover of HEP also indicated at home,  which SLP stressed consistency will be essential to see if recommendations and techniques are effective. Pt verbalized understanding. "Fill up lung" and "let it flow" were occasionally effective cues when reading short phrases. SLP provided visual aids to aid recall of locking doors at home.      Assessment / Recommendations / Plan   Plan Continue with current plan of care;Goals updated      Progression Toward Goals   Progression toward goals Progressing toward goals   slow progression               SLP Short Term Goals - 07/21/21 1156       SLP SHORT TERM GOAL #1   Title Pt will demonstrate dysarthria compensations in 10 minute simple to mod complex conversation with min A over 2 sessions    Status Not Met      SLP SHORT TERM GOAL #2   Title Pt will use memory/attention compensations for appointments, medicine management, and other daily activities with occasional min A over 3 sessions.    Baseline 05-12-21    Status Partially Met      SLP SHORT TERM GOAL #3   Title Pt will ID and correct word finding errors with 80% accuracy given occasional min A over 2 sessions    Baseline 05-12-21    Status Partially Met      SLP SHORT TERM GOAL #4   Title Pt will verbalize 3 safety concerns in the home and generate solutions to them with occasional min A over 2 sessions    Baseline 05-12-21    Status Partially Met      SLP SHORT TERM GOAL #5   Title Pt will complete dysphagia HEP to optimize swallow function with occasional min A over 2 sessions    Status Not Met      SLP SHORT TERM GOAL #6   Title Pt will exhibit improved oral coordination on targeted apraxia tasks with occasional min A over 2 sessions    Status Not Met      SLP SHORT TERM GOAL #7   Title Pt will generate abdominal breathing in 16/20 answers to SLP questions over 2 sessions    Status Not Met    Target Date  07/07/21      SLP SHORT TERM GOAL #8   Title Pt will be 90% intelligible in response to open ended SLP questions with rare min A over 2 sessions    Status Partially Met    Target Date 07/07/21              SLP Long Term Goals - 08/09/21 1103       SLP LONG TERM GOAL #1   Title Pt will demonstrate dysarthria and apraxia compensations in 10 minute simple conversation with occasional min A over  2 sessions    Time 4   re-cert   Period Weeks    Status Revised    Target Date 09/08/21      SLP LONG TERM GOAL #2   Title Pt will use 2 memory/attention compensations for appointments, medicine management, and other daily activities with occasional min A over 2 sessions.    Time 4   re-cert   Period Weeks    Status Revised    Target Date 09/08/21      SLP LONG TERM GOAL #3   Title Pt will ID and correct word finding errors with 95% accuracy given occasional min A over 2 sessions    Status Partially Met      SLP LONG TERM GOAL #4   Title Pt will report improved diet tolerance and reduced s/sx of aspiration with rare min over 4 sessions    Baseline 06-09-21, 07-21-21    Time --    Period --    Status Partially Met      SLP LONG TERM GOAL #5   Title Pt will undergo repeat objective swallow study as needed if s/sx of aspiration persist or change in respiration indicated    Time --    Period --    Status Deferred      SLP LONG TERM GOAL #6   Title Pt will generate abdominal breathing throughout 10 minute conversation occasional min A over 2 sessions    Time --    Period --    Status Not Met              Plan - 08/09/21 1142     Clinical Impression Statement Tamecia cont to report changes in speech and cognitive communication s/p CVA in February 2022. Pt returned to [facility] following procedure with clearance from MD. Pt indicates speech was regressed; however, limited practice completed at home. Pt indicates she has trouble communicating with friends and family due to speech  impairment. Pt continues to present with halting speech and dysfluency seemingly related to apraxia. SLP targeted "fill up lung" and "let it flow" to reduce tension for short phrases, which was occasionally effective. SLP emphasized need for daily practice as limited progress exhibited since last ST session, with re-cert for 2x/week for 4 weeks submitted due to missed visits. Skilled ST is recommended to address speech, cognitive communication, and possibly dysphagia impacting pt's overall safety and functional independence compared to baseline.    Speech Therapy Frequency 2x / week    Duration 4 weeks    Treatment/Interventions Aspiration precaution training;Oral motor exercises;Compensatory strategies;Pharyngeal strengthening exercises;Functional tasks;Patient/family education;Diet toleration management by SLP;Cognitive reorganization;Multimodal communcation approach;Language facilitation;Compensatory techniques;Internal/external aids;SLP instruction and feedback    Potential to Achieve Goals Fair    Potential Considerations Ability to learn/carryover information;Severity of impairments    SLP Home Exercise Plan provided    Consulted and Agree with Plan of Care Patient             Patient will benefit from skilled therapeutic intervention in order to improve the following deficits and impairments:   Verbal apraxia  Dysarthria and anarthria  Cognitive communication deficit  Aphasia    Problem List Patient Active Problem List   Diagnosis Date Noted   Acute CVA (cerebrovascular accident) ([facility]) 12/29/2020   Peripheral arterial disease ([facility]) 12/20/2020   History of esophageal stricture 07/08/2020   Insomnia 12/02/2017   Chronic cough 08/14/2017   Poor appetite 08/29/2016   Peptic stricture of esophagus    History of  colonic polyps    Hiatal hernia 03/16/2016   Odynophagia 03/16/2016   Dysphagia 03/16/2016   Alcohol abuse 10/26/2015   Essential hypertension 09/26/2015   PAC  (premature atrial contraction) 10/11/2014   PVC (premature ventricular contraction) 10/11/2014   Back muscle spasm 06/15/2014   Generalized anxiety disorder 06/15/2014   Smoking 06/15/2014   Chronic hepatitis C without hepatic coma ([facility]) 05/28/2013    Alinda Deem, MA CCC-SLP 08/09/2021, 12:04 PM  [facility] [address] [place], [place], [address] Phone: [phone]   Fax:  [phone]   Name: KIERSTYNN BABICH MRN: [id number] Date of Birth: 09-28-1956

## 2021-08-09 NOTE — Patient Instructions (Signed)
Medication Instructions:  Your physician recommends that you continue on your current medications as directed. Please refer to the Current Medication list given to you today.  *If you need a refill on your cardiac medications before your next appointment, please call your pharmacy*   Testing/Procedures: Your physician has requested that you have a lower extremity arterial duplex. This test is an ultrasound of the arteries in the legs. It looks at arterial blood flow in the legs. Allow one hour for Lower Arterial scans. There are no restrictions or special instructions.  Your physician has requested that you have an ankle brachial index (ABI). During this test an ultrasound and blood pressure cuff are used to evaluate the arteries that supply the arms and legs with blood. Allow thirty minutes for this exam. There are no restrictions or special instructions.  To be done March/April 2023. These procedures will be done at [facility].   Follow-Up: At [facility], you and your health needs are our priority.  As part of our continuing mission to provide you with exceptional heart care, we have created designated Provider Care Teams.  These Care Teams include your primary Cardiologist (physician) and Advanced Practice Providers (APPs -  Physician Assistants and Nurse Practitioners) who all work together to provide you with the care you need, when you need it.  We recommend signing up for the patient portal called "MyChart".  Sign up information is provided on this After Visit Summary.  MyChart is used to connect with patients for Virtual Visits (Telemedicine).  Patients are able to view lab/test results, encounter notes, upcoming appointments, etc.  Non-urgent messages can be sent to your provider as well.   To learn more about what you can do with MyChart, go to [email or web address].    Your next appointment:   12 month(s)  The format for your next appointment:   In Person  Provider:    Quay Burow, MD

## 2021-08-15 ENCOUNTER — Ambulatory Visit: Payer: Medicare HMO | Attending: Student

## 2021-08-15 ENCOUNTER — Other Ambulatory Visit: Payer: Self-pay

## 2021-08-15 DIAGNOSIS — I69851 Hemiplegia and hemiparesis following other cerebrovascular disease affecting right dominant side: Secondary | ICD-10-CM | POA: Insufficient documentation

## 2021-08-15 DIAGNOSIS — R2681 Unsteadiness on feet: Secondary | ICD-10-CM | POA: Diagnosis present

## 2021-08-15 DIAGNOSIS — R482 Apraxia: Secondary | ICD-10-CM | POA: Diagnosis present

## 2021-08-15 DIAGNOSIS — R29818 Other symptoms and signs involving the nervous system: Secondary | ICD-10-CM | POA: Insufficient documentation

## 2021-08-15 DIAGNOSIS — R471 Dysarthria and anarthria: Secondary | ICD-10-CM | POA: Insufficient documentation

## 2021-08-15 DIAGNOSIS — R41841 Cognitive communication deficit: Secondary | ICD-10-CM | POA: Diagnosis present

## 2021-08-15 DIAGNOSIS — M6281 Muscle weakness (generalized): Secondary | ICD-10-CM | POA: Diagnosis present

## 2021-08-15 DIAGNOSIS — R42 Dizziness and giddiness: Secondary | ICD-10-CM | POA: Insufficient documentation

## 2021-08-15 NOTE — Therapy (Signed)
[facility] [address] [place], [place], [address] Phone: [phone]   Fax:  [phone]  Speech Language Pathology Treatment  Patient Details  Name: Kara Bailey MRN: [id number] Date of Birth: 02/02/1956 Referring Provider (SLP): Cipriano Mile, NP   Encounter Date: 08/15/2021   End of Session - 08/15/21 1105     Visit Number 22    Number of Visits 31    Date for SLP Re-Evaluation 09/08/21   extended 2x/week for 4 weeks   Authorization Type [employer]    Authorization - Visit Number 5    Authorization - Number of Visits 16    SLP Start Time 3383    SLP Stop Time  1057    SLP Time Calculation (min) 39 min    Activity Tolerance Patient tolerated treatment well             Past Medical History:  Diagnosis Date   Anxiety    Chest pain 08/2014   unspecified   Cirrhosis ([facility])    Depression    Dizziness 08/2014   Dyspnea 08/2014   Emphysema lung (HCC)    GERD (gastroesophageal reflux disease)    Heart murmur    Hepatitis C    Hiatal hernia    Hypertension    PAC (premature atrial contraction) 10/11/2014   PAD (peripheral artery disease) (HCC)    Palpitations    PVC (premature ventricular contraction) 10/11/2014   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   Stroke ([place])    just slurred speech   Tachycardia 08/2014   Ventral hernia    Weakness 08/2014    Past Surgical History:  Procedure Laterality Date   ABDOMINAL AORTOGRAM W/LOWER EXTREMITY N/A 07/13/2021   Procedure: ABDOMINAL AORTOGRAM W/LOWER EXTREMITY;  Surgeon: Lorretta Harp, MD;  Location: [facility];  Service: Cardiovascular;  Laterality: N/A;   APPENDECTOMY     CATARACT EXTRACTION, BILATERAL     COLONOSCOPY     COLONOSCOPY WITH PROPOFOL N/A 04/30/2016   Procedure: COLONOSCOPY WITH PROPOFOL;  Surgeon: Daneil Dolin, MD;  [facility];  Service: Endoscopy;  Laterality: N/A;  1230    ESOPHAGOGASTRODUODENOSCOPY     approximately 2010   ESOPHAGOGASTRODUODENOSCOPY (EGD) WITH PROPOFOL N/A 04/30/2016   Procedure: ESOPHAGOGASTRODUODENOSCOPY (EGD) WITH PROPOFOL;  Surgeon: Daneil Dolin, MD;  [facility];  Service: Endoscopy;  Laterality: N/A;   LEFT HEART CATHETERIZATION WITH CORONARY ANGIOGRAM N/A 10/29/2014   07-14-20- pt denies this Procedure: LEFT HEART CATHETERIZATION WITH CORONARY ANGIOGRAM;  Surgeon: Burn[place] Blanks, MD;  Location: [facility] CATH LAB;  Service: Cardiovascular;  Laterality: N/A;   MALONEY DILATION N/A 04/30/2016   Procedure: Venia Minks DILATION;  Surgeon: Daneil Dolin, MD;  [facility];  Service: Endoscopy;  Laterality: N/A;   PERIPHERAL VASCULAR INTERVENTION  07/13/2021   Procedure: PERIPHERAL VASCULAR INTERVENTION;  Surgeon: Lorretta Harp, MD;  Location: [facility];  Service: Cardiovascular;;  left SFA left external iliac   POLYPECTOMY  04/30/2016   Procedure: POLYPECTOMY;  Surgeon: Daneil Dolin, MD;  [facility];  Service: Endoscopy;;  Sigmoid colon polyp removed via hot snare   UPPER GASTROINTESTINAL ENDOSCOPY      There were no vitals filed for this visit.   Subjective Assessment - 08/15/21 1023     Subjective Pt reports it was making her nervous to talk with abdominal breathing and with compensations.    Currently in Pain? No/denies  ADULT SLP TREATMENT - 08/15/21 1026       General Information   Behavior/Cognition Alert;Cooperative;Pleasant mood      Treatment Provided   Treatment provided Cognitive-Linquistic      Cognitive-Linquistic Treatment   Treatment focused on Dysarthria;Apraxia    Skilled Treatment "Too much concentration going on - it makes me nervous talking trying to do all that stuff like y'all want. I'm just going to talk and whatever happens happens." Pt does appear to produce more fluent speech today than in previous sessions with this SLP. SLP took pt  outdoors and walked around hte building for 10 minutes with pt intelligibility at 95%+. W/hen SLP and pt entered building again pt started to speed up her speech which necessitated SLP asking pt to repeat x2. When pt did so she improved intelligibility 2/2. SLP told pt we may need to delete some appointments if pt is becoming more comfortable with her speech now, and it is helpful for her fluency/fluidity.Pt agreed, saying "Whatever you think you gotta do."      Assessment / Recommendations / Plan   Plan Continue with current plan of care      Progression Toward Goals   Progression toward goals Progressing toward goals              SLP Education - 08/15/21 1104     Education Details may need to delete some appointments - talk next time with SLP about this    Person(s) Educated Patient    Methods Explanation    Comprehension Verbalized understanding              SLP Short Term Goals - 07/21/21 1156       SLP SHORT TERM GOAL #1   Title Pt will demonstrate dysarthria compensations in 10 minute simple to mod complex conversation with min A over 2 sessions    Status Not Met      SLP SHORT TERM GOAL #2   Title Pt will use memory/attention compensations for appointments, medicine management, and other daily activities with occasional min A over 3 sessions.    Baseline 05-12-21    Status Partially Met      SLP SHORT TERM GOAL #3   Title Pt will ID and correct word finding errors with 80% accuracy given occasional min A over 2 sessions    Baseline 05-12-21    Status Partially Met      SLP SHORT TERM GOAL #4   Title Pt will verbalize 3 safety concerns in the home and generate solutions to them with occasional min A over 2 sessions    Baseline 05-12-21    Status Partially Met      SLP SHORT TERM GOAL #5   Title Pt will complete dysphagia HEP to optimize swallow function with occasional min A over 2 sessions    Status Not Met      SLP SHORT TERM GOAL #6   Title Pt will exhibit  improved oral coordination on targeted apraxia tasks with occasional min A over 2 sessions    Status Not Met      SLP SHORT TERM GOAL #7   Title Pt will generate abdominal breathing in 16/20 answers to SLP questions over 2 sessions    Status Not Met    Target Date 07/07/21      SLP SHORT TERM GOAL #8   Title Pt will be 90% intelligible in response to open ended SLP questions with rare min A over 2 sessions  Status Partially Met    Target Date 07/07/21              SLP Long Term Goals - 08/15/21 1108       SLP LONG TERM GOAL #1   Title Pt will demonstrate dysarthria and apraxia compensations in 10 minute simple conversation with occasional min A over 2 sessions    Time 4   re-cert   Period Weeks    Status Revised    Target Date 09/08/21      SLP LONG TERM GOAL #2   Title Pt will use 2 memory/attention compensations for appointments, medicine management, and other daily activities with occasional min A over 2 sessions.    Time 4   re-cert   Period Weeks    Status Revised    Target Date 09/08/21      SLP LONG TERM GOAL #3   Title Pt will ID and correct word finding errors with 95% accuracy given occasional min A over 2 sessions    Status Partially Met      SLP LONG TERM GOAL #4   Title Pt will report improved diet tolerance and reduced s/sx of aspiration with rare min over 4 sessions    Baseline 06-09-21, 07-21-21    Status Partially Met      SLP LONG TERM GOAL #5   Title Pt will undergo repeat objective swallow study as needed if s/sx of aspiration persist or change in respiration indicated    Status Deferred      SLP LONG TERM GOAL #6   Title Pt will generate abdominal breathing throughout 10 minute conversation occasional min A over 2 sessions    Status Not Met              Plan - 08/15/21 1105     Clinical Impression Statement Kara Bailey cont to present with changes in speech, cognitive communication, and swallowing s/p CVA in February 2022. Pt returned to [facility]  following procedure with clearance from MD. Pt indicates speech was regressed; however, limited practice completed at home. Pt indicates she has trouble communicating with friends and family due to speech impairment. Pt reported to SLP today that she was more "nervous" about using compensations and it was "stressful" for her and so her speech was more dysfluent/less fluid because of that. Today she just talked without using compensations for breath support and articulation and was in fact more fluid with speech rate. The last 5 minutes of the session SLP had to ask pt to repeat x2 due to a fast rate of speech. Skilled ST is recommended to address speech, cognitive communication, and dysphagia impacting pt's overall safety and functional independence compared to baseline. SLP told pt we may delete visits if necessary now that pt is more comfortable with her current speech.    Speech Therapy Frequency 2x / week    Duration 4 weeks    Treatment/Interventions Aspiration precaution training;Oral motor exercises;Compensatory strategies;Pharyngeal strengthening exercises;Functional tasks;Patient/family education;Diet toleration management by SLP;Cognitive reorganization;Multimodal communcation approach;Language facilitation;Compensatory techniques;Internal/external aids;SLP instruction and feedback    Potential to Achieve Goals Fair    Potential Considerations Ability to learn/carryover information;Severity of impairments    SLP Home Exercise Plan provided    Consulted and Agree with Plan of Care Patient             Patient will benefit from skilled therapeutic intervention in order to improve the following deficits and impairments:   Verbal apraxia  Dysarthria and anarthria  Cognitive  communication deficit    Problem List Patient Active Problem List   Diagnosis Date Noted   Acute CVA (cerebrovascular accident) ([facility]) 12/29/2020   Peripheral arterial disease ([place] [facility]) 12/20/2020   History of  esophageal stricture 07/08/2020   Insomnia 12/02/2017   Chronic cough 08/14/2017   Poor appetite 08/29/2016   Peptic stricture of esophagus    History of colonic polyps    Hiatal hernia 03/16/2016   Odynophagia 03/16/2016   Dysphagia 03/16/2016   Alcohol abuse 10/26/2015   Essential hypertension 09/26/2015   PAC (premature atrial contraction) 10/11/2014   PVC (premature ventricular contraction) 10/11/2014   Back muscle spasm 06/15/2014   Generalized anxiety disorder 06/15/2014   Smoking 06/15/2014   Chronic hepatitis C without hepatic coma ([place]) 05/28/2013    [facility] ,[place], [place]  08/15/2021, 11:09 AM  [facility] [address] [place], [place], [address] Phone: [phone]   Fax:  [phone]   Name: Kara Bailey MRN: [id number] Date of Birth: 1956/10/01

## 2021-08-18 ENCOUNTER — Ambulatory Visit: Payer: Medicare HMO

## 2021-08-18 ENCOUNTER — Other Ambulatory Visit: Payer: Self-pay

## 2021-08-18 DIAGNOSIS — R471 Dysarthria and anarthria: Secondary | ICD-10-CM

## 2021-08-18 DIAGNOSIS — R482 Apraxia: Secondary | ICD-10-CM | POA: Diagnosis not present

## 2021-08-18 DIAGNOSIS — R41841 Cognitive communication deficit: Secondary | ICD-10-CM

## 2021-08-18 NOTE — Patient Instructions (Addendum)
My recommendations: -write down your orders on your phone or notepad to help you remember and say it better -practice your order ahead of time (I.e., in the car) -think about going to places at less busy times so you don't feel so pressured  -we know your speech is worse when you feel pressured or rushed, try to reduce the pressure on yourself by slowing down and taking a deep breath -be an advocate for yourself: tell people you've had a stroke or you speak differently. Request more time. Ask people to wait for you to finish (hold up finger or hand)

## 2021-08-18 NOTE — Therapy (Signed)
[facility] [address] [place], [place], [address] Phone: [phone]   Fax:  [phone]  Speech Language Pathology Treatment  Patient Details  Name: Kara Bailey MRN: [id number] Date of Birth: 20-May-1956 Referring Provider (SLP): Cipriano Mile, NP   Encounter Date: 08/18/2021   End of Session - 08/18/21 0939     Visit Number 23    Number of Visits 31    Date for SLP Re-Evaluation 09/08/21    Authorization Type [employer]    SLP Start Time 0935    SLP Stop Time  4008    SLP Time Calculation (min) 40 min    Activity Tolerance Patient tolerated treatment well             Past Medical History:  Diagnosis Date   Anxiety    Chest pain 08/2014   unspecified   Cirrhosis ([place])    Depression    Dizziness 08/2014   Dyspnea 08/2014   Emphysema lung (H[facility])    GERD (gastroesophageal reflux disease)    Heart murmur    Hepatitis C    Hiatal hernia    Hypertension    PAC (premature atrial contraction) 10/11/2014   PAD (peripheral artery disease) (HCC)    Palpitations    PVC (premature ventricular contraction) 10/11/2014   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   Stroke ([place])    just slurred speech   Tachycardia 08/2014   Ventral hernia    Weakness 08/2014    Past Surgical History:  Procedure Laterality Date   ABDOMINAL AORTOGRAM W/LOWER EXTREMITY N/A 07/13/2021   Procedure: ABDOMINAL AORTOGRAM W/LOWER EXTREMITY;  Surgeon: Lorretta Harp, MD;  Location: [facility];  Service: Cardiovascular;  Laterality: N/A;   APPENDECTOMY     CATARACT EXTRACTION, BILATERAL     COLONOSCOPY     COLONOSCOPY WITH PROPOFOL N/A 04/30/2016   Procedure: COLONOSCOPY WITH PROPOFOL;  Surgeon: Daneil Dolin, MD;  [facility];  Service: Endoscopy;  Laterality: N/A;  1230   ESOPHAGOGASTRODUODENOSCOPY     approximately 2010   ESOPHAGOGASTRODUODENOSCOPY (EGD) WITH PROPOFOL N/A 04/30/2016    Procedure: ESOPHAGOGASTRODUODENOSCOPY (EGD) WITH PROPOFOL;  Surgeon: Daneil Dolin, MD;  [facility];  Service: Endoscopy;  Laterality: N/A;   LEFT HEART CATHETERIZATION WITH CORONARY ANGIOGRAM N/A 10/29/2014   07-14-20- pt denies this Procedure: LEFT HEART CATHETERIZATION WITH CORONARY ANGIOGRAM;  Surgeon: Burnell Blanks, MD;  Location: [facility];  Service: Cardiovascular;  Laterality: N/A;   MALONEY DILATION N/A 04/30/2016   Procedure: Venia Minks DILATION;  Surgeon: Daneil Dolin, MD;  [facility];  Service: Endoscopy;  Laterality: N/A;   PERIPHERAL VASCULAR INTERVENTION  07/13/2021   Procedure: PERIPHERAL VASCULAR INTERVENTION;  Surgeon: Lorretta Harp, MD;  Location: [facility];  Service: Cardiovascular;;  left SFA left external iliac   POLYPECTOMY  04/30/2016   Procedure: POLYPECTOMY;  Surgeon: Daneil Dolin, MD;  [facility];  Service: Endoscopy;;  Sigmoid colon polyp removed via hot snare   UPPER GASTROINTESTINAL ENDOSCOPY      There were no vitals filed for this visit.   Subjective Assessment - 08/18/21 0945     Subjective "My speech is better"    Currently in Pain? No/denies                   ADULT SLP TREATMENT - 08/18/21 0938       General Information   Behavior/Cognition Alert;Cooperative;Pleasant  mood      Treatment Provided   Treatment provided Cognitive-Linquistic      Cognitive-Linquistic Treatment   Treatment focused on Dysarthria;Apraxia    Skilled Treatment Pt reports "speech is better" without external pressures to utilize compensations; however, pt reports use of "deep breath" and "break it up" which are techniques targeted in previous ST sessions. Pt exhibited improved fluency of speech in conversation, with occasional rushes in speech impacting speech intelligibility. SLP re-iterated slow rate. SLP also provided various recommendations to aid verbal expression within community, as pt reports she is  avoiding conversations and feels embarassed when talking, especially if she gets stuck. SLP suggested writing out her orders to practice ahead of time, going to restaurants/stores during less busy times of day to reduce unnecessary pressure, and managing external/internal pressures via advocacy. Pt believes her overall progress is waning and requested ST decrease 1x/week for maintenance.      Assessment / Recommendations / Plan   Plan Continue with current plan of care      Progression Toward Goals   Progression toward goals Progressing toward goals              SLP Education - 08/18/21 1015     Education Details functional recommendations, advocacy    Person(s) Educated Patient    Methods Explanation;Demonstration;Handout    Comprehension Verbalized understanding;Returned demonstration;Need further instruction              SLP Short Term Goals - 07/21/21 1156       SLP SHORT TERM GOAL #1   Title Pt will demonstrate dysarthria compensations in 10 minute simple to mod complex conversation with min A over 2 sessions    Status Not Met      SLP SHORT TERM GOAL #2   Title Pt will use memory/attention compensations for appointments, medicine management, and other daily activities with occasional min A over 3 sessions.    Baseline 05-12-21    Status Partially Met      SLP SHORT TERM GOAL #3   Title Pt will ID and correct word finding errors with 80% accuracy given occasional min A over 2 sessions    Baseline 05-12-21    Status Partially Met      SLP SHORT TERM GOAL #4   Title Pt will verbalize 3 safety concerns in the home and generate solutions to them with occasional min A over 2 sessions    Baseline 05-12-21    Status Partially Met      SLP SHORT TERM GOAL #5   Title Pt will complete dysphagia HEP to optimize swallow function with occasional min A over 2 sessions    Status Not Met      SLP SHORT TERM GOAL #6   Title Pt will exhibit improved oral coordination on targeted  apraxia tasks with occasional min A over 2 sessions    Status Not Met      SLP SHORT TERM GOAL #7   Title Pt will generate abdominal breathing in 16/20 answers to SLP questions over 2 sessions    Status Not Met    Target Date 07/07/21      SLP SHORT TERM GOAL #8   Title Pt will be 90% intelligible in response to open ended SLP questions with rare min A over 2 sessions    Status Partially Met    Target Date 07/07/21              SLP Long Term Goals - 08/18/21  0940       SLP LONG TERM GOAL #1   Title Pt will demonstrate dysarthria and apraxia compensations in 10 minute simple conversation with occasional min A over 2 sessions    Time 4   re-cert   Period Weeks    Status On-going    Target Date 09/08/21      SLP LONG TERM GOAL #2   Title Pt will use 2 memory/attention compensations for appointments, medicine management, and other daily activities with occasional min A over 2 sessions.    Time 4   re-cert   Period Weeks    Status On-going    Target Date 09/08/21      SLP LONG TERM GOAL #3   Title Pt will ID and correct word finding errors with 95% accuracy given occasional min A over 2 sessions    Status Partially Met      SLP LONG TERM GOAL #4   Title Pt will report improved diet tolerance and reduced s/sx of aspiration with rare min over 4 sessions    Baseline 06-09-21, 07-21-21    Status Partially Met      SLP LONG TERM GOAL #5   Title Pt will undergo repeat objective swallow study as needed if s/sx of aspiration persist or change in respiration indicated    Status Deferred      SLP LONG TERM GOAL #6   Title Pt will generate abdominal breathing throughout 10 minute conversation occasional min A over 2 sessions    Status Not Met              Plan - 08/18/21 1016     Clinical Impression Statement Daliah cont to present with changes in speech, cognitive communication, and swallowing s/p CVA in February 2022. Pt reports speech has improved by focusing on "deep  breath" and "breathe in between." SLP provided education and instruction of recommendations to aid functional communication in various opportunities that pt is experiencing trouble. Skilled ST is recommended to address speech, cognitive communication, and dysphagia impacting pt's overall safety and functional independence compared to baseline.   Speech Therapy Frequency 2x / week   pt requested 1x/week   Duration 4 weeks    Treatment/Interventions Aspiration precaution training;Oral motor exercises;Compensatory strategies;Pharyngeal strengthening exercises;Functional tasks;Patient/family education;Diet toleration management by SLP;Cognitive reorganization;Multimodal communcation approach;Language facilitation;Compensatory techniques;Internal/external aids;SLP instruction and feedback    Potential to Achieve Goals Fair    Potential Considerations Ability to learn/carryover information;Severity of impairments    SLP Home Exercise Plan provided    Consulted and Agree with Plan of Care Patient             Patient will benefit from skilled therapeutic intervention in order to improve the following deficits and impairments:   Dysarthria and anarthria  Cognitive communication deficit    Problem List Patient Active Problem List   Diagnosis Date Noted   Acute CVA (cerebrovascular accident) ([place]) 12/29/2020   Peripheral arterial disease ([place]) 12/20/2020   History of esophageal stricture 07/08/2020   Insomnia 12/02/2017   Chronic cough 08/14/2017   Poor appetite 08/29/2016   Peptic stricture of esophagus    History of colonic polyps    Hiatal hernia 03/16/2016   Odynophagia 03/16/2016   Dysphagia 03/16/2016   Alcohol abuse 10/26/2015   Essential hypertension 09/26/2015   PAC (premature atrial contraction) 10/11/2014   PVC (premature ventricular contraction) 10/11/2014   Back muscle spasm 06/15/2014   Generalized anxiety disorder 06/15/2014   Smoking 06/15/2014   Chronic  hepatitis C  without hepatic coma ([place]) 05/28/2013    Alinda Deem, MA CCC-SLP 08/18/2021, 11:10 AM  [facility] [address] [place], [place], [address] Phone: [phone]   Fax:  [phone]   Name: Kara Bailey MRN: [id number] Date of Birth: Feb 07, 1956

## 2021-08-21 ENCOUNTER — Ambulatory Visit: Payer: Medicare HMO

## 2021-08-21 ENCOUNTER — Ambulatory Visit: Payer: Medicare HMO | Admitting: Adult Health

## 2021-08-21 NOTE — Progress Notes (Deleted)
[facility] [address] [place]. [place] [address] [phone]       STROKE FOLLOW UP NOTE  Ms. Kara Bailey Date of Birth:  1956/04/13 Medical Record Number:  [id number]   Reason for Referral: Stroke follow up    SUBJECTIVE:   CHIEF COMPLAINT:  No chief complaint on file.   HPI:   Today, 08/21/2021, Kara Bailey returns for stroke follow-up.  Overall doing well.  Reports residual ***.  Continues to work with SLP with ongoing improvement.  Denies new stroke/TIA symptoms. S/p LLE stent 07/13/2021 for PAD by Dr. Gwenlyn Found without complication and improvement of left leg claudication.  Currently on DAPT postprocedure as well as atorvastatin without side effects.  Blood pressure today ***.  Tobacco use ***.  EtOH use ***.      History provided for reference purposes only Initial visit 04/11/2021 JM: Kara Bailey is being seen for hospital follow-up unaccompanied  Reports residual speech difficulties and right-sided weakness with numbness which has been slowly improving. Mild dysphagia with improvement currently on regular diet without difficulty.  Ambulates without assistive device and denies any recent falls. Since completed home health therapies - plans to start with neuro rehab Friday  She does live with her significant other and able to complete ADLs and majority of IADLs with assistance needed at times. Denies new stroke/TIA symptoms  Completed 3 weeks DAPT remains on aspirin alone as well as atorvastatin 80 mg daily without associated side effects Blood pressure today 145/88 - occassionally monitors at home which has been stable  Continued tobacco use but has been able to cut back with use of patches Continue EtOH use approximately a 12 pack/week   No further concerns at this time  Stroke admission 12/29/2020  Kara Bailey is a 65 y.o. female with a history of smoking, PAD, HTN, ETOH abuse, chronic hepatitis C with hepatic coma,GAD, esophageal  stricture,  recent episode of possible hematemesis, who presented to [facility] ED  on 12/29/2020 with right-sided weakness, difficulty swallowing and difficulty speaking x3 days.  Personally reviewed hospitalization pertinent progress notes, lab work and imaging summary provided. In [facility] ED, MRI showed left thalamic infarct and transferred to [facility] for further evaluation and care.  Evaluated by Dr. Leonie Man and infarct likely secondary to small vessel disease.  CTA head/neck unremarkable.  2D echo EF 75%.  LDL 58.  A1c 5.5.  Recommended DAPT for 3 weeks and aspirin alone.  HTN stable.  Increase atorvastatin dose from 10 mg to 80 mg daily.  Tobacco cessation counseling provided.  EtOH use disorder on CIWA protocol.  MR brain showed areas of old hemorrhagic infarctions in the right superior cerebellum, right basal ganglia and radiating white matter tracts.  Residual deficits of moderate dysarthria, expressive aphasia, dysphagia (Dys3 NTL diet), mild right facial weakness and right hemiparesis.  Evaluated by therapies who recommended HH PT/OT/SLP and discharged home in stable condition on 12/31/2020.       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   Stroke ([place])    just slurred speech   Tachycardia 08/2014   Ventral hernia    Weakness 08/2014    PSH:  Past Surgical History:  Procedure Laterality Date   ABDOMINAL AORTOGRAM W/LOWER  EXTREMITY N/A 07/13/2021   Procedure: ABDOMINAL AORTOGRAM W/LOWER EXTREMITY;  Surgeon: Lorretta Harp, MD;  Location: [facility];  Service: Cardiovascular;  Laterality: N/A;   APPENDECTOMY     CATARACT EXTRACTION, BILATERAL     COLONOSCOPY     COLONOSCOPY WITH PROPOFOL N/A 04/30/2016   Procedure: COLONOSCOPY WITH PROPOFOL;  Surgeon: Daneil Dolin, MD;  [facility];  Service: Endoscopy;  Laterality: N/A;  1230   ESOPHAGOGASTRODUODENOSCOPY     approximately 2010   ESOPHAGOGASTRODUODENOSCOPY (EGD) WITH PROPOFOL N/A 04/30/2016   Procedure: ESOPHAGOGASTRODUODENOSCOPY (EGD) WITH PROPOFOL;  Surgeon: Daneil Dolin, MD;  [facility];  Service: Endoscopy;  Laterality: N/A;   LEFT HEART CATHETERIZATION WITH CORONARY ANGIOGRAM N/A 10/29/2014   07-14-20- pt denies this Procedure: LEFT HEART CATHETERIZATION WITH CORONARY ANGIOGRAM;  Surgeon: Burnell Blanks, MD;  Location: [facility];  Service: Cardiovascular;  Laterality: N/A;   MALONEY DILATION N/A 04/30/2016   Procedure: Venia Minks DILATION;  Surgeon: Daneil Dolin, MD;  [facility];  Service: Endoscopy;  Laterality: N/A;   PERIPHERAL VASCULAR INTERVENTION  07/13/2021   Procedure: PERIPHERAL VASCULAR INTERVENTION;  Surgeon: Lorretta Harp, MD;  Location: [facility];  Service: Cardiovascular;;  left SFA left external iliac   POLYPECTOMY  04/30/2016   Procedure: POLYPECTOMY;  Surgeon: Daneil Dolin, MD;  [facility];  Service: Endoscopy;;  Sigmoid colon polyp removed via hot snare   UPPER GASTROINTESTINAL ENDOSCOPY      Social History:  Social History   Socioeconomic History   Marital status: Divorced    Spouse name: Not on file   Number of children: 2   Years of education: Not on file   Highest education level: Not on file  Occupational History   Occupation: retired  Tobacco Use   Smoking status: Every Day    Packs/day: 0.50    Years: 40.00    Pack years: 20.00    Types:  Cigarettes    Start date: 11/12/1968   Smokeless tobacco: Never   Tobacco comments:    cutting back, wearing patches  Vaping Use   Vaping Use: Never used  Substance and Sexual Activity   Alcohol use: Yes    Alcohol/week: 0.0 standard drinks    Comment: beer on weekends (6-pack)   Drug use: No   Sexual activity: Not Currently  Other Topics Concern   Not on file  Social History Narrative   Not on file   Social Determinants of Health   Financial Resource Strain: Not on file  Food Insecurity: Not on file  Transportation Needs: Not on file  Physical Activity: Not on file  Stress: Not on file  Social Connections: Not on file  Intimate Partner Violence: Not on file    Family History:  Family History  Problem Relation Age of Onset   Heart failure Mother    Heart attack Mother    Heart disease Mother    Hypertension Mother    Hyperlipidemia Mother    CVA Father    Alzheimer's disease  Father    Cancer Neg Hx    Colon cancer Neg Hx    Esophageal cancer Neg Hx    Rectal cancer Neg Hx    Stomach cancer Neg Hx     Medications:   Current Outpatient Medications on File Prior to Visit  Medication Sig Dispense Refill   albuterol (VENTOLIN HFA) 108 (90 Base) MCG/ACT inhaler Inhale 2 puffs into the lungs every 6 (six) hours as needed for shortness of breath or wheezing.     aspirin EC 81 MG EC tablet Take 1 tablet (81 mg total) by mouth daily. Swallow whole. 30 tablet 11   atorvastatin (LIPITOR) 80 MG tablet Take 1 tablet (80 mg total) by mouth daily. (Patient taking differently: Take 80 mg by mouth at bedtime.) 30 tablet 0   busPIRone (BUSPAR) 15 MG tablet Take 1 tablet (15 mg total) by mouth 2 (two) times daily. 60 tablet 5   clopidogrel (PLAVIX) 75 MG tablet Take 1 tablet (75 mg total) by mouth daily. 90 tablet 1   cyclobenzaprine (FLEXERIL) 10 MG tablet Take 10 mg by mouth daily as needed for muscle spasms (Back pain).     diltiazem (TIAZAC) 180 MG 24 hr capsule Take 180 mg by  mouth in the morning.     escitalopram (LEXAPRO) 20 MG tablet Take 20 mg by mouth in the morning.     famotidine (PEPCID) 40 MG tablet Take 40 mg by mouth at bedtime.     hydrOXYzine (ATARAX/VISTARIL) 50 MG tablet Take 50 mg by mouth 3 (three) times daily as needed for anxiety.     mirtazapine (REMERON) 30 MG tablet Take 0.5 tablets (15 mg total) by mouth at bedtime. (Patient taking differently: Take 30 mg by mouth at bedtime.) 30 tablet 5   Multiple Vitamin (MULTIVITAMIN WITH MINERALS) TABS tablet Take 1 tablet by mouth daily. 30 tablet 1   nicotine (NICODERM CQ - DOSED IN MG/24 HOURS) 21 mg/24hr patch Place 1 patch (21 mg total) onto the skin daily. (Patient taking differently: Place 21 mg onto the skin daily as needed.) 28 patch 0   pantoprazole (PROTONIX) 40 MG tablet Take 1 tablet (40 mg total) by mouth daily. (Patient taking differently: Take 40 mg by mouth in the morning.) 30 tablet 5   thiamine 100 MG tablet Take 1 tablet (100 mg total) by mouth daily. (Patient taking differently: Take 100 mg by mouth in the morning.) 30 tablet 0   Current Facility-Administered Medications on File Prior to Visit  Medication Dose Route Frequency Provider Last Rate Last Admin   sodium chloride flush (NS) 0.9 % injection 3 mL  3 mL Intravenous Q12H Lorretta Harp, MD        Allergies:  No Known Allergies    OBJECTIVE:  Physical Exam  There were no vitals filed for this visit.  There is no height or weight on file to calculate BMI. No results found.  General: well developed, well nourished,  pleasant elderly African-American female, seated, in no evident distress Head: head normocephalic and atraumatic.   Neck: supple with no carotid or supraclavicular bruits Cardiovascular: regular rate and rhythm, no murmurs Musculoskeletal: no deformity Skin:  no rash/petichiae Vascular:  Normal pulses all extremities   Neurologic Exam Mental Status: Awake and fully alert.   Mild to moderate dysarthria  and mild aphasia.  Oriented to place and time. Recent memory impaired and remote memory intact. Attention span, concentration and fund of knowledge appropriate during visit. Mood and affect appropriate.  Cranial Nerves: Pupils equal, briskly reactive to light. Extraocular movements full without nystagmus. Visual fields full to confrontation. Hearing intact. Facial sensation intact.  Mild facial asymmetry.  Tongue, and palate moves normally and symmetrically.  Motor: Normal strength, bulk and tone left upper and lower extremity.  Possible mild right-sided weakness greater distally but difficulty fully evaluating due to giveaway weakness Sensory.:  Decreased light touch sensation right upper extremity otherwise BLE intact to light touch and intact to pinprick , position and vibratory sensation throughout all extremities.  Subjective right upper and lower extremity numbness Coordination: Rapid alternating movements slowed in both hands. Finger-to-nose and heel-to-shin performed accurately bilaterally. Gait and Station: Arises from chair without difficulty. Stance is normal. Gait demonstrates normal stride length and mild imbalance without use of assistive device.  Unable to tandem walk and heel toe.  Reflexes: 1+ and symmetric. Toes downgoing.        ASSESSMENT: Kara Bailey is a 64 y.o. year old female with left thalamic ischemic stroke likely secondary to small vessel disease on 12/29/2020. Vascular risk factors include HTN, HLD, PAD, EtOH abuse, tobacco use, and advanced age.      PLAN:  Left thalamic stroke:  Residual deficit: mild right sided weakness with sensory impairment, dysarthria, aphasia, dysphagia and short-term memory complaints.  Continue working with SLP Continue aspirin 81 mg daily  and atorvastatin 80 mg daily for secondary stroke prevention.   Discussed secondary stroke prevention measures and importance of close PCP follow up for aggressive stroke risk factor management -  she has establish care with new PCP at [facility] but unable to provide name of provider -she was advised to update this on MyChart once able HTN: BP goal <130/90.  Stable on current regimen per PCP HLD: LDL goal <70. Recent LDL 58 -currently on atorvastatin 80 mg daily.  PAD: s/p left extrailiac artery and mid left SFA stent 07/13/2021 by Dr. Gwenlyn Found.  Currently on DAPT for total of 3 months per Dr. Gwenlyn Found Tobacco and EtOH use: Discussed importance of complete cessation as continued use greatly increases risk of recurrent stroke    Follow up in 4 months or call earlier if needed   CC:  [facility] provider: Dr. Leonie Man   I spent 56 minutes of face-to-face and non-face-to-face time with patient.  This included previsit chart review, study review, electronic health record documentation, and discussion and patient education regarding prior stroke including etiology, residual deficits and typical recovery time, importance of managing stroke risk factors and compliance with all prescribed medications, EtOH and tobacco cessation and answered all other questions to patient satisfaction  Frann Rider, AGNP-BC  [facility] [address] [place], [place] [address]  Phone [phone] Fax [phone] Note: This document was prepared with digital dictation and possible smart phrase technology. Any transcriptional errors that result from this process are unintentional.

## 2021-08-25 ENCOUNTER — Other Ambulatory Visit: Payer: Self-pay

## 2021-08-25 ENCOUNTER — Ambulatory Visit: Payer: Medicare HMO

## 2021-08-25 DIAGNOSIS — R482 Apraxia: Secondary | ICD-10-CM

## 2021-08-25 DIAGNOSIS — R471 Dysarthria and anarthria: Secondary | ICD-10-CM

## 2021-08-25 NOTE — Therapy (Signed)
[facility] [address] [place], [place], [address] Phone: [phone]   Fax:  [phone]  Speech Language Pathology Treatment  Patient Details  Name: Kara Bailey MRN: [id number] Date of Birth: 12/02/1955 Referring Provider (SLP): Cipriano Mile, NP   Encounter Date: 08/25/2021   End of Session - 08/25/21 1007     Visit Number 24    Number of Visits 31    Date for SLP Re-Evaluation 09/08/21    Authorization Type [employer]    Authorization - Visit Number 6    Authorization - Number of Visits 15    SLP Start Time 1638    SLP Stop Time  0931    SLP Time Calculation (min) 41 min    Activity Tolerance Patient tolerated treatment well             Past Medical History:  Diagnosis Date   Anxiety    Chest pain 08/2014   unspecified   Cirrhosis ([place])    Depression    Dizziness 08/2014   Dyspnea 08/2014   Emphysema lung (HCC)    GERD (gastroesophageal reflux disease)    Heart murmur    Hepatitis C    Hiatal hernia    Hypertension    PAC (premature atrial contraction) 10/11/2014   PAD (peripheral artery disease) (HCC)    Palpitations    PVC (premature ventricular contraction) 10/11/2014   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   Stroke ([place])    just slurred speech   Tachycardia 08/2014   Ventral hernia    Weakness 08/2014    Past Surgical History:  Procedure Laterality Date   ABDOMINAL AORTOGRAM W/LOWER EXTREMITY N/A 07/13/2021   Procedure: ABDOMINAL AORTOGRAM W/LOWER EXTREMITY;  Surgeon: Lorretta Harp, MD;  Location: [facility];  Service: Cardiovascular;  Laterality: N/A;   APPENDECTOMY     CATARACT EXTRACTION, BILATERAL     COLONOSCOPY     COLONOSCOPY WITH PROPOFOL N/A 04/30/2016   Procedure: COLONOSCOPY WITH PROPOFOL;  Surgeon: Daneil Dolin, MD;  [facility];  Service: Endoscopy;  Laterality: N/A;  1230   ESOPHAGOGASTRODUODENOSCOPY     approximately  2010   ESOPHAGOGASTRODUODENOSCOPY (EGD) WITH PROPOFOL N/A 04/30/2016   Procedure: ESOPHAGOGASTRODUODENOSCOPY (EGD) WITH PROPOFOL;  Surgeon: Daneil Dolin, MD;  [facility];  Service: Endoscopy;  Laterality: N/A;   LEFT HEART CATHETERIZATION WITH CORONARY ANGIOGRAM N/A 10/29/2014   07-14-20- pt denies this Procedure: LEFT HEART CATHETERIZATION WITH CORONARY ANGIOGRAM;  Surgeon: Burnell Blanks, MD;  Location: [facility];  Service: Cardiovascular;  Laterality: N/A;   MALONEY DILATION N/A 04/30/2016   Procedure: Venia Minks DILATION;  Surgeon: Daneil Dolin, MD;  [facility];  Service: Endoscopy;  Laterality: N/A;   PERIPHERAL VASCULAR INTERVENTION  07/13/2021   Procedure: PERIPHERAL VASCULAR INTERVENTION;  Surgeon: Lorretta Harp, MD;  Location: [facility];  Service: Cardiovascular;;  left SFA left external iliac   POLYPECTOMY  04/30/2016   Procedure: POLYPECTOMY;  Surgeon: Daneil Dolin, MD;  [facility];  Service: Endoscopy;;  Sigmoid colon polyp removed via hot snare   UPPER GASTROINTESTINAL ENDOSCOPY      There were no vitals filed for this visit.   Subjective Assessment - 08/25/21 0851     Currently in Pain? Yes    Pain Score 8     Pain Location Abdomen    Pain Orientation Mid    Pain Descriptors / Indicators Aching  Pain Onset More than a month ago                   ADULT SLP TREATMENT - 08/25/21 0852       General Information   Behavior/Cognition Alert;Cooperative;Pleasant mood      Treatment Provided   Treatment provided Cognitive-Linquistic      Cognitive-Linquistic Treatment   Treatment focused on Dysarthria;Apraxia    Skilled Treatment Pt is wearing her dentures today - "they fit good." SLP practiced her orders with pt so that she could feel more confident aobut ordering. SLP wrote orders on paper so that pt could practice them prior to ordering. SLP also educated pt about tapping leg or fingers together to  facilitate rhythym to assist more fluent speech. SLP reiterated to pt all points from previous ST session - pt stated she did tell people in restaurant that she had a CVA and her speech was different. In conversation pt approx 90% intelligibile - intelligibility incr'd when pt asked to repeat.      Assessment / Recommendations / Plan   Plan Continue with current plan of care      Progression Toward Goals   Progression toward goals Progressing toward goals   x1/week             SLP Education - 08/25/21 1007     Education Details reiterated all items from previous ST session    Person(s) Educated Patient    Methods Explanation    Comprehension Verbalized understanding              SLP Short Term Goals - 07/21/21 1156       SLP SHORT TERM GOAL #1   Title Pt will demonstrate dysarthria compensations in 10 minute simple to mod complex conversation with min A over 2 sessions    Status Not Met      SLP SHORT TERM GOAL #2   Title Pt will use memory/attention compensations for appointments, medicine management, and other daily activities with occasional min A over 3 sessions.    Baseline 05-12-21    Status Partially Met      SLP SHORT TERM GOAL #3   Title Pt will ID and correct word finding errors with 80% accuracy given occasional min A over 2 sessions    Baseline 05-12-21    Status Partially Met      SLP SHORT TERM GOAL #4   Title Pt will verbalize 3 safety concerns in the home and generate solutions to them with occasional min A over 2 sessions    Baseline 05-12-21    Status Partially Met      SLP SHORT TERM GOAL #5   Title Pt will complete dysphagia HEP to optimize swallow function with occasional min A over 2 sessions    Status Not Met      SLP SHORT TERM GOAL #6   Title Pt will exhibit improved oral coordination on targeted apraxia tasks with occasional min A over 2 sessions    Status Not Met      SLP SHORT TERM GOAL #7   Title Pt will generate abdominal breathing in  16/20 answers to SLP questions over 2 sessions    Status Not Met    Target Date 07/07/21      SLP SHORT TERM GOAL #8   Title Pt will be 90% intelligible in response to open ended SLP questions with rare min A over 2 sessions    Status Partially Met  Target Date 07/07/21              SLP Long Term Goals - 08/25/21 1008       SLP LONG TERM GOAL #1   Title Pt will demonstrate dysarthria and apraxia compensations in 10 minute simple conversation with occasional min A over 2 sessions    Time 4   re-cert   Period Weeks    Status On-going    Target Date 09/08/21      SLP LONG TERM GOAL #2   Title Pt will use 2 memory/attention compensations for appointments, medicine management, and other daily activities with occasional min A over 2 sessions.    Time 4   re-cert   Period Weeks    Status On-going    Target Date 09/08/21      SLP LONG TERM GOAL #3   Title Pt will ID and correct word finding errors with 95% accuracy given occasional min A over 2 sessions    Status Partially Met    Target Date 09/08/21      SLP LONG TERM GOAL #4   Title Pt will report improved diet tolerance and reduced s/sx of aspiration with rare min over 4 sessions    Baseline 06-09-21, 07-21-21    Status Partially Met      SLP LONG TERM GOAL #5   Title Pt will undergo repeat objective swallow study as needed if s/sx of aspiration persist or change in respiration indicated    Status Deferred      SLP LONG TERM GOAL #6   Title Pt will generate abdominal breathing throughout 10 minute conversation occasional min A over 2 sessions    Status Not Met              Plan - 08/25/21 1008     Clinical Impression Statement Kara Bailey cont to present with changes in speech, cognitive communication, and swallowing s/p CVA in February 2022. Pt reports speech has improved by focusing on "deep breath" and "breathe in between." SLP provided education and instruction of recommendations to aid functional communication in  various opportunities that pt is experiencing trouble. Skilled ST is recommended to address speech, cognitive communication, and dysphagia impacting pt's overall safety and functional independence compared to baseline. SLP told pt we may delete visits if necessary now that pt is more comfortable with her current speech.    Speech Therapy Frequency 2x / week   pt requested 1x/week   Duration 4 weeks    Treatment/Interventions Aspiration precaution training;Oral motor exercises;Compensatory strategies;Pharyngeal strengthening exercises;Functional tasks;Patient/family education;Diet toleration management by SLP;Cognitive reorganization;Multimodal communcation approach;Language facilitation;Compensatory techniques;Internal/external aids;SLP instruction and feedback    Potential to Achieve Goals Fair    Potential Considerations Ability to learn/carryover information;Severity of impairments    SLP Home Exercise Plan provided    Consulted and Agree with Plan of Care Patient             Patient will benefit from skilled therapeutic intervention in order to improve the following deficits and impairments:   Dysarthria and anarthria  Verbal apraxia    Problem List Patient Active Problem List   Diagnosis Date Noted   Acute CVA (cerebrovascular accident) ([place]) 12/29/2020   Peripheral arterial disease ([place]) 12/20/2020   History of esophageal stricture 07/08/2020   Insomnia 12/02/2017   Chronic cough 08/14/2017   Poor appetite 08/29/2016   Peptic stricture of esophagus    History of colonic polyps    Hiatal hernia 03/16/2016   Odynophagia 03/16/2016  Dysphagia 03/16/2016   Alcohol abuse 10/26/2015   Essential hypertension 09/26/2015   PAC (premature atrial contraction) 10/11/2014   PVC (premature ventricular contraction) 10/11/2014   Back muscle spasm 06/15/2014   Generalized anxiety disorder 06/15/2014   Smoking 06/15/2014   Chronic hepatitis C without hepatic coma ([place]) 05/28/2013     [place] ,[place], [place]  08/25/2021, 10:09 AM  [facility] [address] [place], [place], [address] Phone: [phone]   Fax:  [phone]   Name: Kara Bailey MRN: [id number] Date of Birth: July 08, 1956

## 2021-08-25 NOTE — Patient Instructions (Signed)
  Continue to follow all things Belenda Cruise told you last session. So happy to see you shared with people in the restaurant about your speech being different!

## 2021-08-28 ENCOUNTER — Ambulatory Visit: Payer: Medicare HMO

## 2021-08-28 ENCOUNTER — Other Ambulatory Visit: Payer: Self-pay

## 2021-08-31 ENCOUNTER — Ambulatory Visit: Payer: Self-pay | Admitting: Surgery

## 2021-09-08 ENCOUNTER — Ambulatory Visit: Payer: Medicare HMO

## 2021-09-08 ENCOUNTER — Encounter: Payer: Self-pay | Admitting: Physical Therapy

## 2021-09-08 ENCOUNTER — Other Ambulatory Visit: Payer: Self-pay

## 2021-09-08 ENCOUNTER — Ambulatory Visit: Payer: Medicare HMO | Admitting: Physical Therapy

## 2021-09-08 DIAGNOSIS — R41841 Cognitive communication deficit: Secondary | ICD-10-CM

## 2021-09-08 DIAGNOSIS — R482 Apraxia: Secondary | ICD-10-CM | POA: Diagnosis not present

## 2021-09-08 DIAGNOSIS — R42 Dizziness and giddiness: Secondary | ICD-10-CM

## 2021-09-08 DIAGNOSIS — I69851 Hemiplegia and hemiparesis following other cerebrovascular disease affecting right dominant side: Secondary | ICD-10-CM

## 2021-09-08 DIAGNOSIS — R471 Dysarthria and anarthria: Secondary | ICD-10-CM

## 2021-09-08 DIAGNOSIS — M6281 Muscle weakness (generalized): Secondary | ICD-10-CM

## 2021-09-08 DIAGNOSIS — R2681 Unsteadiness on feet: Secondary | ICD-10-CM

## 2021-09-08 DIAGNOSIS — R29818 Other symptoms and signs involving the nervous system: Secondary | ICD-10-CM

## 2021-09-08 NOTE — Therapy (Signed)
[facility] [address] [place], [place], [address] Phone: [phone]   Fax:  [phone]  Speech Language Pathology Treatment/Recert  Patient Details  Name: Kara Bailey MRN: [id number] Date of Birth: [birth year] Referring Provider (SLP): Cipriano Mile, NP   Encounter Date: 09/08/2021   End of Session - 09/08/21 0902     Visit Number 25    Number of Visits 31    Date for SLP Re-Evaluation 09/15/21   extended for last scheduled visit per patient request   Authorization Type [facility] Medicare    Authorization Time Period 8 visits 08/09/21-09/13/21    SLP Start Time 0847    SLP Stop Time  0930    SLP Time Calculation (min) 43 min    Activity Tolerance Patient tolerated treatment well             Past Medical History:  Diagnosis Date   Anxiety    Chest pain 08/2014   unspecified   Cirrhosis ([place])    Depression    Dizziness 08/2014   Dyspnea 08/2014   Emphysema lung (HCC)    GERD (gastroesophageal reflux disease)    Heart murmur    Hepatitis C    Hiatal hernia    Hypertension    PAC (premature atrial contraction) 10/11/2014   PAD (peripheral artery disease) (HCC)    Palpitations    PVC (premature ventricular contraction) 10/11/2014   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   Stroke ([place])    just slurred speech   Tachycardia 08/2014   Ventral hernia    Weakness 08/2014    Past Surgical History:  Procedure Laterality Date   ABDOMINAL AORTOGRAM W/LOWER EXTREMITY N/A 07/13/2021   Procedure: ABDOMINAL AORTOGRAM W/LOWER EXTREMITY;  Surgeon: Lorretta Harp, MD;  Location: [facility];  Service: Cardiovascular;  Laterality: N/A;   APPENDECTOMY     CATARACT EXTRACTION, BILATERAL     COLONOSCOPY     COLONOSCOPY WITH PROPOFOL N/A 04/30/2016   Procedure: COLONOSCOPY WITH PROPOFOL;  Surgeon: Daneil Dolin, MD;  [facility];  Service: Endoscopy;  Laterality: N/A;  1230    ESOPHAGOGASTRODUODENOSCOPY     approximately 2010   ESOPHAGOGASTRODUODENOSCOPY (EGD) WITH PROPOFOL N/A 04/30/2016   Procedure: ESOPHAGOGASTRODUODENOSCOPY (EGD) WITH PROPOFOL;  Surgeon: Daneil Dolin, MD;  [facility];  Service: Endoscopy;  Laterality: N/A;   LEFT HEART CATHETERIZATION WITH CORONARY ANGIOGRAM N/A 10/29/2014   07-14-20- pt denies this Procedure: LEFT HEART CATHETERIZATION WITH CORONARY ANGIOGRAM;  Surgeon: Burnell Blanks, MD;  Location: [facility];  Service: Cardiovascular;  Laterality: N/A;   MALONEY DILATION N/A 04/30/2016   Procedure: Venia Minks DILATION;  Surgeon: Daneil Dolin, MD;  [facility];  Service: Endoscopy;  Laterality: N/A;   PERIPHERAL VASCULAR INTERVENTION  07/13/2021   Procedure: PERIPHERAL VASCULAR INTERVENTION;  Surgeon: Lorretta Harp, MD;  Location: [facility];  Service: Cardiovascular;;  left SFA left external iliac   POLYPECTOMY  04/30/2016   Procedure: POLYPECTOMY;  Surgeon: Daneil Dolin, MD;  [facility];  Service: Endoscopy;;  Sigmoid colon polyp removed via hot snare   UPPER GASTROINTESTINAL ENDOSCOPY      There were no vitals filed for this visit.   Subjective Assessment - 09/08/21 0850     Subjective "I think my speech is getting better"    Currently in Pain? No/denies  ADULT SLP TREATMENT - 09/08/21 0850       General Information   Behavior/Cognition Alert;Cooperative;Pleasant mood      Treatment Provided   Treatment provided Cognitive-Linquistic      Cognitive-Linquistic Treatment   Treatment focused on Cognition    Skilled Treatment Pt reports speech is better as pt is slowing down and advocating for herself. Pt indicated memory is "worse." SLP reviewed memory compensations, including putting items in one location and pausing/slowing down thought processing to aid problem solving. SLP targeted functional task related to managing grocery list with specific  items and budget. Pt recalled instructions with intermittent min A. Pt demonstrated independent strategies to aid thought organization, including request for scrap paper and marking off previously used items. SLP generated strategy to aid calculations as pt kept losing total near end of list, as pt unable to problem solve a different solution.      Assessment / Recommendations / Plan   Plan Continue with current plan of care      Progression Toward Goals   Progression toward goals Progressing toward goals              SLP Education - 09/08/21 0859     Education Details HEP, memory and attention compensations    Person(s) Educated Patient    Methods Explanation;Demonstration;Handout    Comprehension Verbalized understanding;Returned demonstration;Need further instruction              SLP Short Term Goals - 07/21/21 1156       SLP SHORT TERM GOAL #1   Title Pt will demonstrate dysarthria compensations in 10 minute simple to mod complex conversation with min A over 2 sessions    Status Not Met      SLP SHORT TERM GOAL #2   Title Pt will use memory/attention compensations for appointments, medicine management, and other daily activities with occasional min A over 3 sessions.    Baseline 05-12-21    Status Partially Met      SLP SHORT TERM GOAL #3   Title Pt will ID and correct word finding errors with 80% accuracy given occasional min A over 2 sessions    Baseline 05-12-21    Status Partially Met      SLP SHORT TERM GOAL #4   Title Pt will verbalize 3 safety concerns in the home and generate solutions to them with occasional min A over 2 sessions    Baseline 05-12-21    Status Partially Met      SLP SHORT TERM GOAL #5   Title Pt will complete dysphagia HEP to optimize swallow function with occasional min A over 2 sessions    Status Not Met      SLP SHORT TERM GOAL #6   Title Pt will exhibit improved oral coordination on targeted apraxia tasks with occasional min A over 2  sessions    Status Not Met      SLP SHORT TERM GOAL #7   Title Pt will generate abdominal breathing in 16/20 answers to SLP questions over 2 sessions    Status Not Met    Target Date 07/07/21      SLP SHORT TERM GOAL #8   Title Pt will be 90% intelligible in response to open ended SLP questions with rare min A over 2 sessions    Status Partially Met    Target Date 07/07/21              SLP Long Term Goals - 09/08/21  0908       SLP LONG TERM GOAL #1   Title Pt will demonstrate dysarthria and apraxia compensations in 10 minute simple conversation with occasional min A over 2 sessions    Baseline 09-08-21    Time 4   re-cert   Period Weeks    Status On-going    Target Date 09/15/21   extended for recert     SLP LONG TERM GOAL #2   Title Pt will use 2 memory/attention compensations for appointments, medicine management, and other daily activities with occasional min A over 2 sessions.    Baseline 09-08-21    Time 4   re-cert   Period Weeks    Status On-going    Target Date 09/15/21      SLP LONG TERM GOAL #3   Title Pt will ID and correct word finding errors with 95% accuracy given occasional min A over 2 sessions    Status Partially Met      SLP LONG TERM GOAL #4   Title Pt will report improved diet tolerance and reduced s/sx of aspiration with rare min over 4 sessions    Baseline 06-09-21, 07-21-21    Status Partially Met      SLP LONG TERM GOAL #5   Title Pt will undergo repeat objective swallow study as needed if s/sx of aspiration persist or change in respiration indicated    Status Deferred      SLP LONG TERM GOAL #6   Title Pt will generate abdominal breathing throughout 10 minute conversation occasional min A over 2 sessions    Status Not Met              Plan - 09/08/21 0905     Clinical Impression Statement Kara Bailey reports speech has improved by focusing on slow rate. Pt has used SLP recommendations to advocate her speech changes and used  recommendations to aid functional communication in various opportunities. Pt requested this visit and last visit focus on cognition which has reportedly worsened. SLP reviewed cognitive strategies to optimize daily functioning. Skilled ST is recommended to address speech, cognitive communication impacting pt's overall safety and functional independence compared to baseline.    Speech Therapy Frequency 2x / week   pt requested 1x/week   Duration One additional visit    Treatment/Interventions Aspiration precaution training;Oral motor exercises;Compensatory strategies;Pharyngeal strengthening exercises;Functional tasks;Patient/family education;Diet toleration management by SLP;Cognitive reorganization;Multimodal communcation approach;Language facilitation;Compensatory techniques;Internal/external aids;SLP instruction and feedback    Potential to Achieve Goals Fair    Potential Considerations Ability to learn/carryover information;Severity of impairments    SLP Home Exercise Plan provided    Consulted and Agree with Plan of Care Patient             Patient will benefit from skilled therapeutic intervention in order to improve the following deficits and impairments:   Cognitive communication deficit  Dysarthria and anarthria  Verbal apraxia    Problem List Patient Active Problem List   Diagnosis Date Noted   Acute CVA (cerebrovascular accident) ([place]) 12/29/2020   Peripheral arterial disease ([place]) 12/20/2020   History of esophageal stricture 07/08/2020   Insomnia 12/02/2017   Chronic cough 08/14/2017   Poor appetite 08/29/2016   Peptic stricture of esophagus    History of colonic polyps    Hiatal hernia 03/16/2016   Odynophagia 03/16/2016   Dysphagia 03/16/2016   Alcohol abuse 10/26/2015   Essential hypertension 09/26/2015   PAC (premature atrial contraction) 10/11/2014   PVC (premature ventricular contraction) 10/11/2014  Back muscle spasm 06/15/2014   Generalized anxiety  disorder 06/15/2014   Smoking 06/15/2014   Chronic hepatitis C without hepatic coma ([place]) 05/28/2013    Alinda Deem, MA CCC-SLP 09/08/2021, 6:43 PM  [facility] [address] [place], [place], [address] Phone: [phone]   Fax:  [phone]   Name: Kara Bailey MRN: [id number] Date of Birth: [birth year]

## 2021-09-08 NOTE — Therapy (Addendum)
[facility] [address] [place], [place], [address] Phone: [phone]   Fax:  [phone]  Physical Therapy Treatment - ReEvaluation  Patient Details  Name: Kara Bailey MRN: [id number] Date of Birth: 12/19/1955 Referring Provider (PT): Stowe, Shanna, NP   Encounter Date: 09/08/2021    09/08/21 0810  PT Visits / Re-Eval  Visit Number 21  Number of Visits 29  Date for PT Re-Evaluation 11/07/21 (per recert on 09/08/21)  Authorization  Authorization Type [employer] - awaiting new auth.  Authorization Time Period 16 visits from 67/22- 07/17/21, 12 visits through 06/09/21 - 08/08/21  Progress Note Due on Visit 20  PT Time Calculation  PT Start Time 0804  PT Stop Time 0844  PT Time Calculation (min) 40 min  PT - End of Session  Equipment Utilized During Treatment Gait belt  Activity Tolerance Patient tolerated treatment well  Behavior During Therapy WFL for tasks assessed/performed     Past Medical History:  Diagnosis Date   Anxiety    Chest pain 08/2014   unspecified   Cirrhosis (HCC)    Depression    Dizziness 08/2014   Dyspnea 08/2014   Emphysema lung (HCC)    GERD (gastroesophageal reflux disease)    Heart murmur    Hepatitis C    Hiatal hernia    Hypertension    PAC (premature atrial contraction) 10/11/2014   PAD (peripheral artery disease) (HCC)    Palpitations    PVC (premature ventricular contraction) 10/11/2014   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   Stroke (HCC)    just slurred speech   Tachycardia 08/2014   Ventral hernia    Weakness 08/2014    Past Surgical History:  Procedure Laterality Date   ABDOMINAL AORTOGRAM W/LOWER EXTREMITY N/A 07/13/2021   Procedure: ABDOMINAL AORTOGRAM W/LOWER EXTREMITY;  Surgeon: Berry, Jonathan J, MD;  Location: [facility];  Service: Cardiovascular;  Laterality: N/A;   APPENDECTOMY     CATARACT EXTRACTION, BILATERAL      COLONOSCOPY     COLONOSCOPY WITH PROPOFOL N/A 04/30/2016   Procedure: COLONOSCOPY WITH PROPOFOL;  Surgeon: Robert M Rourk, MD;  Location: [facility];  Service: Endoscopy;  Laterality: N/A;  1230   ESOPHAGOGASTRODUODENOSCOPY     approximately 2010   ESOPHAGOGASTRODUODENOSCOPY (EGD) WITH PROPOFOL N/A 04/30/2016   Procedure: ESOPHAGOGASTRODUODENOSCOPY (EGD) WITH PROPOFOL;  Surgeon: Robert M Rourk, MD;  Location: [facility];  Service: Endoscopy;  Laterality: N/A;   LEFT HEART CATHETERIZATION WITH CORONARY ANGIOGRAM N/A 10/29/2014   07-14-20- pt denies this Procedure: LEFT HEART CATHETERIZATION WITH CORONARY ANGIOGRAM;  Surgeon: Christopher D McAlhany, MD;  Location: [facility];  Service: Cardiovascular;  Laterality: N/A;   MALONEY DILATION N/A 04/30/2016   Procedure: MALONEY DILATION;  Surgeon: Robert M Rourk, MD;  Location: [facility];  Service: Endoscopy;  Laterality: N/A;   PERIPHERAL VASCULAR INTERVENTION  07/13/2021   Procedure: PERIPHERAL VASCULAR INTERVENTION;  Surgeon: Berry, Jonathan J, MD;  Location: [facility];  Service: Cardiovascular;;  left SFA left external iliac   POLYPECTOMY  04/30/2016   Procedure: POLYPECTOMY;  Surgeon: Robert M Rourk, MD;  Location: [facility];  Service: Endoscopy;;  Sigmoid colon polyp removed via hot snare   UPPER GASTROINTESTINAL ENDOSCOPY      There were no vitals filed for this visit.   Subjective Assessment - 09/08/21 0807     Subjective Returns to PT with resume orders from her cardiologist. At the beginning of   september had outpatient peripheral vascular angiography and endovascular therapy (iliac stent) for PAD. Reports pain in LLE is much better. No falls, had a couple of almosts, but thinks it was because she was turning too fast. Overall, feels like she is doing better.Getting hernia surgery at the beginning of December.    Pertinent History smoking, PAD, HTN, ETOH abuse, chronic hepatitis C with hepatic coma,GAD, esophageal stricture     Limitations Walking;Standing    Patient Stated Goals wants to work on walking normally    Currently in Pain? No/denies    Pain Onset More than a month ago                [facility] PT Assessment - 09/08/21 0811       Assessment   Medical Diagnosis CVA    Referring Provider (PT) Cipriano Mile, NP    Onset Date/Surgical Date 12/29/20    Hand Dominance Right      Prior Function   Level of Independence Independent      Observation/Other Assessments   Observations Pt reporting incr dizziness when turning, had pt perform VOR x1 in horizontal direction with pt reporting mild dizziness and had difficulty keeping eye on target      Transfers   Transfers Sit to Stand;Stand to Sit    Sit to Stand 5: Supervision;With upper extremity assist;Without upper extremity assist    Five time sit to stand comments  17.97 seconds with UE support from chair, 24.69 seconds with no UE support    Stand to Sit 5: Supervision;With upper extremity assist;Without upper extremity assist      Ambulation/Gait   Ambulation/Gait Yes    Ambulation/Gait Assistance 4: Min guard;5: Supervision    Ambulation/Gait Assistance Details Min guard when pt fatigues    Ambulation Distance (Feet) 320 Feet   during 3MWT   Assistive device None    Gait Pattern Decreased arm swing - right;Step-through pattern;Decreased stance time - right;Decreased hip/knee flexion - right;Decreased dorsiflexion - right;Poor foot clearance - right;Lateral trunk lean to right;Poor foot clearance - left    Ambulation Surface Level;Indoor      6 Minute Walk- Baseline   6 Minute Walk- Baseline yes    BP (mmHg) 151/88    HR (bpm) 93    02 Sat (%RA) 100 %    Modified Borg Scale for Dyspnea 0- Nothing at all      6 Minute walk- Post Test   6 Minute Walk Post Test yes    BP (mmHg) 161/84    HR (bpm) 98    02 Sat (%RA) 100 %    Modified Borg Scale for Dyspnea 3- Moderate shortness of breath or breathing difficulty    Perceived Rate of  Exertion (Borg) 13- Somewhat hard      6 minute walk test results    Aerobic Endurance Distance Walked 320    Endurance additional comments 3MWT, no AD and needing 2 standing rest breaks due to fatigue      Timed Up and Go Test   TUG Normal TUG    Normal TUG (seconds) 15.81   no AD   TUG Comments 13.13 seconds (2nd attempt per pr request)                           [facility] PT Treatment/Exercise - 09/08/21 6962       Ambulation/Gait   Gait Comments Has not ordered the foot up brace to assist  with foot clearance during gait, esp when more fatigued.                       PT Short Term Goals - 06/28/21 0935       PT SHORT TERM GOAL #1   Title Pt will decr TUG time to 14 seconds or less in order to demo decr fall risk. ALL STGS DUE 06/30/21    Baseline 15.93 seconds on 06/06/21, 13.25 seconds on 06/28/21    Time 3    Period Weeks    Status Achieved    Target Date 06/30/21      PT SHORT TERM GOAL #2   Title Pt will undego 2-3 MWT with LTG to be written as appropriate with use of foot up brace to determine improved gait endurance.    Baseline 3MWT -  360' on 06/15/21 with LTG written.    Time 3    Period Weeks    Status Achieved      PT SHORT TERM GOAL #3   Title Pt will improve gait speed to at least 2.2 ft/sec with no AD vs. LRAD in order to demo decr fall risk.    Baseline last assessed 1.83 ft/sec no AD; 12.63 seconds = 2.59 ft/sec on 06/28/21    Time 3    Period Weeks    Status Achieved               PT Long Term Goals - 09/08/21 0822       PT LONG TERM GOAL #1   Title Pt will be independent with final HEP in order to build upon functional gains made in therapy ALL LTGS DUE 07/14/21    Baseline pt reports not performing her HEP since she was last here    Time 6    Period Weeks    Status Not Met      PT LONG TERM GOAL #2   Title Pt will go ascend and descend 4 steps with single handrail vs. use of LRAD with supervision in order to  be able to go to her son's house.    Baseline pt reports she has not been to her son's house    Time 6    Period Weeks    Status Not Met      PT LONG TERM GOAL #3   Title Pt will decr 5x sit <> stand time to 20 seconds or less in order to demo improved BLE strength and balance.    Baseline 23.75 seconds from standard height chair on 06/09/21; 24.69 seconds with no UE support on 09/08/21    Time 6    Period Weeks    Status Not Met      PT LONG TERM GOAL #4   Title Pt will improve 3MWT distance to at least 410' in order to demo improved walking endurance and gait efficiency.    Baseline 3MWT - 360' on 06/15/21; 320' on 09/08/21 with 2 standing rest breaks    Time 6    Period Weeks    Status Not Met      PT LONG TERM GOAL #6   Title Pt will improve FOTO score to at least 60% in order to demo improved functional outcomes.    Baseline 50%; 52.4% on 06/06/21    Time 6    Period Weeks    Status On-going             Revised LTGs:  PT   Long Term Goals - 09/11/21 1715       PT LONG TERM GOAL #1   Title Pt will be independent with final HEP and walking program with rollator in order to build upon functional gains made in therapy ALL LTGS DUE 10/09/21    Baseline pt reports not performing her HEP since she was last here    Time 4    Period Weeks    Status Revised    Target Date 10/09/21      PT LONG TERM GOAL #3   Title Pt will decr 5x sit <> stand time to 21 seconds or less with no UE support in order to demo improved BLE strength and balance.    Baseline 23.75 seconds from standard height chair on 06/09/21; 24.69 seconds with no UE support on 09/08/21    Time 4    Period Weeks    Status Revised      PT LONG TERM GOAL #4   Title Pt will improve 3MWT distance to at least 380' with no AD vs. rollator and no rest breaks in order to demo improved walking endurance and gait efficiency.    Baseline 3MWT - 360' on 06/15/21; 320' on 09/08/21 with 2 standing rest breaks    Time 4     Period Weeks    Status Revised      PT LONG TERM GOAL #6   Title Pt will improve FOTO score to at least 60% in order to demo improved functional outcomes.    Baseline 50%; 52.4% on 06/06/21    Time 4    Period Weeks    Status On-going                 09/11/21 1709  Plan  Clinical Impression Statement Pt returns to therapy today after getting resume orders from her cardiologist after procedure of peripheral vascular angiography and iliac stent. It also took incr time to get pt scheduled to return. Since pt was last in therapy, she has had a decr in her activity level and has not performed her HEP. Pt has not met any of her LTGs. Pt had a decline in her gait distance with 2MWT with no AD. Pt ambulated 320' and needing 2 standing rest breaks, previously pt able to ambulate 360' with no rest breaks. Pt with an incr in time with 5x sit <> stand, performed in 24.69 seconds today (previously was 23.75 seconds). Discussed with pt POC going forwards - will plan to recert for an additional 4 weeks before pt's hernia surgery at the beginning of December. Discussed will focus on finalizing pt's HEP/walking program, will work on strengthening/balance/gait and endurance in order to improve functional mobility and decr fall risk. discussed importance of continuing exercise program to maintain gains made in therapy vs. declining. LTGs written and updated as approrpriate.  Personal Factors and Comorbidities Comorbidity 3+;Past/Current Experience  Comorbidities smoking, PAD, HTN, ETOH abuse, chronic hepatitis C with hepatic coma,GAD, esophageal stricture  Examination-Activity Limitations Locomotion Level;Transfers;Stairs;Squat;Carry  Examination-Participation Restrictions Cleaning;Community Activity;Laundry  Pt will benefit from skilled therapeutic intervention in order to improve on the following deficits Abnormal gait;Decreased activity tolerance;Decreased balance;Decreased coordination;Decreased  endurance;Decreased knowledge of use of DME;Difficulty walking;Decreased strength;Dizziness;Impaired sensation;Postural dysfunction  Stability/Clinical Decision Making Stable/Uncomplicated  Rehab Potential Good  PT Frequency 2x / week (12 visits over 8 weeks)  PT Duration 8 weeks  PT Treatment/Interventions ADLs/Self Care Home Management;DME Instruction;Gait training;Stair training;Therapeutic activities;Functional mobility training;Therapeutic exercise;Balance training;Neuromuscular re-education;Orthotic Fit/Training;Patient/family education;Vestibular  PT Next   Visit Plan review HEP and update. get pt started on walking program with use of rollator. weight shifting activities for LLE. NuStep/SciFit for endurance. BLE strength.  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)     Problem List Patient Active Problem List   Diagnosis Date Noted   Acute CVA (cerebrovascular accident) (HCC) 12/29/2020   Peripheral arterial disease (HCC) 12/20/2020   History of esophageal stricture 07/08/2020   Insomnia 12/02/2017   Chronic cough 08/14/2017   Poor appetite 08/29/2016   Peptic stricture of esophagus    History of colonic polyps    Hiatal hernia 03/16/2016   Odynophagia 03/16/2016   Dysphagia 03/16/2016   Alcohol abuse 10/26/2015   Essential hypertension 09/26/2015   PAC (premature atrial contraction) 10/11/2014   PVC (premature ventricular contraction) 10/11/2014   Back muscle spasm 06/15/2014   Generalized anxiety disorder 06/15/2014   Smoking 06/15/2014   Chronic  hepatitis C without hepatic coma (HCC) 05/28/2013    Chloe N Gilgannon, PT, DPT  09/08/2021, 8:45 AM  [facility] [address] [place], [place], [address] Phone: [phone]   Fax:  [phone]  Name: Che M Bertelson MRN: [id number] Date of Birth: 03/27/1956    

## 2021-09-11 ENCOUNTER — Telehealth: Payer: Self-pay | Admitting: Adult Health

## 2021-09-11 NOTE — Addendum Note (Signed)
Addended by: Arliss Journey on: 09/11/2021 05:21 PM   Modules accepted: Orders

## 2021-09-11 NOTE — Telephone Encounter (Signed)
Patient scheduled to undergo hernia surgery currently scheduled 10/13/2021. Remains on aspirin for secondary stroke prevention measures. Had stroke in 12/2020. No additional events or new stroke/TIA symptoms since that time. Okay to hold aspirin 3-5 days prior to surgery with small but acceptable risk of recurrent stroke off therapy - please ensure restart immediately after or once hemodynamically stable. She is on plavix per cardiology who will provide further clearance regarding DAPT from their standpoint.

## 2021-09-12 ENCOUNTER — Other Ambulatory Visit: Payer: Self-pay

## 2021-09-12 ENCOUNTER — Ambulatory Visit: Payer: Medicare HMO | Attending: Student | Admitting: Physical Therapy

## 2021-09-12 ENCOUNTER — Encounter: Payer: Self-pay | Admitting: Physical Therapy

## 2021-09-12 DIAGNOSIS — R471 Dysarthria and anarthria: Secondary | ICD-10-CM | POA: Insufficient documentation

## 2021-09-12 DIAGNOSIS — R2681 Unsteadiness on feet: Secondary | ICD-10-CM | POA: Insufficient documentation

## 2021-09-12 DIAGNOSIS — R41841 Cognitive communication deficit: Secondary | ICD-10-CM | POA: Diagnosis present

## 2021-09-12 DIAGNOSIS — I69851 Hemiplegia and hemiparesis following other cerebrovascular disease affecting right dominant side: Secondary | ICD-10-CM | POA: Insufficient documentation

## 2021-09-12 DIAGNOSIS — R482 Apraxia: Secondary | ICD-10-CM | POA: Insufficient documentation

## 2021-09-12 DIAGNOSIS — R29818 Other symptoms and signs involving the nervous system: Secondary | ICD-10-CM | POA: Insufficient documentation

## 2021-09-12 DIAGNOSIS — M6281 Muscle weakness (generalized): Secondary | ICD-10-CM | POA: Insufficient documentation

## 2021-09-12 NOTE — Telephone Encounter (Signed)
Janett Billow NP's surgical clearance note printed faxed to [facility]. Received confirmation.

## 2021-09-12 NOTE — Patient Instructions (Signed)
Access Code: [facility] URL: [email or web address] Date: 09/12/2021 Prepared by: Janann August  Exercises Sit to/from Stand in Stride position - 1 x daily - 5 x weekly - 2 sets - 5 reps Seated Knee Extension with Resistance - 1 x daily - 5 x weekly - 1 sets - 10 reps Heel Toe Raises with Counter Support - 1 x daily - 5 x weekly - 1 sets - 10 reps Standing Tandem Balance with Counter Support - 1 x daily - 5 x weekly - 3 sets - 20 hold Standing Single Leg Stance with Counter Support - 1 x daily - 5 x weekly - 1 sets - 3 reps - 10 hold Supine Bridge - 1 x daily - 5 x weekly - 2 sets - 5 reps

## 2021-09-12 NOTE — Therapy (Signed)
[facility] [address] [place], [place], [address] Phone: [phone]   Fax:  [phone]  Physical Therapy Treatment  Patient Details  Name: Kara Bailey MRN: [id number] Date of Birth: 23-Dec-1955 Referring Provider (PT): Cipriano Mile, NP   Encounter Date: 09/12/2021   PT End of Session - 09/12/21 0935     Visit Number 22    Number of Visits 29    Date for PT Re-Evaluation 29/92/42   per recert on 68/34/19   Authorization Type [employer] - awaiting new auth.    Authorization Time Period 16 visits from 67/22- 07/17/21, 12 visits through 06/09/21 - 08/08/21    Progress Note Due on Visit 20    PT Start Time 0932    PT Stop Time 1013    PT Time Calculation (min) 41 min    Equipment Utilized During Treatment --    Activity Tolerance Patient tolerated treatment well    Behavior During Therapy [facility] for tasks assessed/performed             Past Medical History:  Diagnosis Date   Anxiety    Chest pain 08/2014   unspecified   Cirrhosis ([place])    Depression    Dizziness 08/2014   Dyspnea 08/2014   Emphysema lung (H[facility])    GERD (gastroesophageal reflux disease)    Heart murmur    Hepatitis C    Hiatal hernia    Hypertension    PAC (premature atrial contraction) 10/11/2014   PAD (peripheral artery disease) (HCC)    Palpitations    PVC (premature ventricular contraction) 10/11/2014   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   Stroke ([place])    just slurred speech   Tachycardia 08/2014   Ventral hernia    Weakness 08/2014    Past Surgical History:  Procedure Laterality Date   ABDOMINAL AORTOGRAM W/LOWER EXTREMITY N/A 07/13/2021   Procedure: ABDOMINAL AORTOGRAM W/LOWER EXTREMITY;  Surgeon: Lorretta Harp, MD;  Location: [place] CV LAB;  Service: Cardiovascular;  Laterality: N/A;   APPENDECTOMY     CATARACT EXTRACTION, BILATERAL     COLONOSCOPY     COLONOSCOPY WITH PROPOFOL N/A 04/30/2016    Procedure: COLONOSCOPY WITH PROPOFOL;  Surgeon: Daneil Dolin, MD;  [facility];  Service: Endoscopy;  Laterality: N/A;  1230   ESOPHAGOGASTRODUODENOSCOPY     approximately 2010   ESOPHAGOGASTRODUODENOSCOPY (EGD) WITH PROPOFOL N/A 04/30/2016   Procedure: ESOPHAGOGASTRODUODENOSCOPY (EGD) WITH PROPOFOL;  Surgeon: Daneil Dolin, MD;  [facility];  Service: Endoscopy;  Laterality: N/A;   LEFT HEART CATHETERIZATION WITH CORONARY ANGIOGRAM N/A 10/29/2014   07-14-20- pt denies this Procedure: LEFT HEART CATHETERIZATION WITH CORONARY ANGIOGRAM;  Surgeon: Burnell Blanks, MD;  Location: [facility] CATH LAB;  Service: Cardiovascular;  Laterality: N/A;   MALONEY DILATION N/A 04/30/2016   Procedure: Venia Minks DILATION;  Surgeon: Daneil Dolin, MD;  [facility];  Service: Endoscopy;  Laterality: N/A;   PERIPHERAL VASCULAR INTERVENTION  07/13/2021   Procedure: PERIPHERAL VASCULAR INTERVENTION;  Surgeon: Lorretta Harp, MD;  Location: [place] CV LAB;  Service: Cardiovascular;;  left SFA left external iliac   POLYPECTOMY  04/30/2016   Procedure: POLYPECTOMY;  Surgeon: Daneil Dolin, MD;  [facility];  Service: Endoscopy;;  Sigmoid colon polyp removed via hot snare   UPPER GASTROINTESTINAL ENDOSCOPY      There were no vitals filed for this visit.   Subjective Assessment - 09/12/21 0934  Subjective No changes, no falls.    Pertinent History smoking, PAD, HTN, ETOH abuse, chronic hepatitis C with hepatic coma,GAD, esophageal stricture    Limitations Walking;Standing    Patient Stated Goals wants to work on walking normally    Currently in Pain? No/denies    Pain Onset More than a month ago                               [facility] Adult PT Treatment/Exercise - 09/12/21 1008       Ambulation/Gait   Ambulation/Gait Yes    Ambulation/Gait Assistance 5: Supervision    Ambulation/Gait Assistance Details With rollator in hallway mimicing  pt's setup at home for walking program, cued for heel strike. See below HEP instructions for walking program    Assistive device 4-wheeled walker    Gait Pattern Decreased arm swing - right;Step-through pattern;Decreased stance time - right;Decreased hip/knee flexion - right;Decreased dorsiflexion - right;Poor foot clearance - right;Lateral trunk lean to right;Poor foot clearance - left    Ambulation Surface Level;Indoor             Reviewed HEP - see [place] for more details.     Access Code: [facility] URL: [email or web address] Date: 09/12/2021 Prepared by: Janann August  Program Notes Walking program: Walking in your apartment or down your walkway with your rollator focusing on getting your heels down for 2 minutes, aiming for 2 times a day.    Exercises Sit to/from Stand in Stride position - 1 x daily - 5 x weekly - 2 sets - 5 reps Seated Knee Extension with Resistance - 1 x daily - 5 x weekly - 1 sets - 10 reps Heel Toe Raises with Counter Support - 1 x daily - 5 x weekly - 1 sets - 10 reps Standing Tandem Balance with Counter Support - 1 x daily - 5 x weekly - 3 sets - 20 hold Standing Single Leg Stance with Counter Support - 1 x daily - 5 x weekly - 1 sets - 3 reps - 10 hold Supine Bridge - 1 x daily - 5 x weekly - 2 sets - 5 reps      PT Education - 09/12/21 1010     Education Details Reviewed HEP and added walking program for home with rollator    Person(s) Educated Patient    Methods Demonstration;Explanation;Handout    Comprehension Verbalized understanding;Returned demonstration              PT Short Term Goals - 09/11/21 1714       PT SHORT TERM GOAL #1   Title ALL STGS = LTGS               PT Long Term Goals - 09/11/21 1715       PT LONG TERM GOAL #1   Title Pt will be independent with final HEP and walking program with rollator in order to build upon functional gains made in therapy ALL LTGS DUE 10/09/21    Baseline pt  reports not performing her HEP since she was last here    Time 4    Period Weeks    Status Revised    Target Date 10/09/21      PT LONG TERM GOAL #3   Title Pt will decr 5x sit <> stand time to 21 seconds or less with no UE support in order to demo improved BLE strength and balance.  Baseline 23.75 seconds from standard height chair on 06/09/21; 24.69 seconds with no UE support on 09/08/21    Time 4    Period Weeks    Status Revised      PT LONG TERM GOAL #4   Title Pt will improve 3MWT distance to at least 380' with no AD vs. rollator and no rest breaks in order to demo improved walking endurance and gait efficiency.    Baseline 3MWT - 360' on 06/15/21; 320' on 09/08/21 with 2 standing rest breaks    Time 4    Period Weeks    Status Revised      PT LONG TERM GOAL #6   Title Pt will improve FOTO score to at least 60% in order to demo improved functional outcomes.    Baseline 50%; 52.4% on 06/06/21    Time 4    Period Weeks    Status On-going                   Plan - 09/12/21 1011     Clinical Impression Statement Today's skilled session focused on reviewing and updating pt's previous HEP as pt had not been consistently performing. Also added a walking program for home that pt can perform in her apartment with rollator. Beginning at 2 minutes 2 times a day and focusing on heel strike with gait. Pt tolerated session well, will continue to progress towards LTGs.    Personal Factors and Comorbidities Comorbidity 3+;Past/Current Experience    Comorbidities smoking, PAD, HTN, ETOH abuse, chronic hepatitis C with hepatic coma,GAD, esophageal stricture    Examination-Activity Limitations Locomotion Level;Transfers;Stairs;Squat;Carry    Examination-Participation Restrictions Cleaning;Community Activity;Laundry    Stability/Clinical Decision Making Stable/Uncomplicated    Rehab Potential Good    PT Frequency 2x / week   12 visits over 8 weeks   PT Duration 8 weeks    PT  Treatment/Interventions ADLs/Self Care Home Management;DME Instruction;Gait training;Stair training;Therapeutic activities;Functional mobility training;Therapeutic exercise;Balance training;Neuromuscular re-education;Orthotic Fit/Training;Patient/family education;Vestibular    PT Next Visit Plan walking with rollator/no AD. weight shifting activities for LLE. NuStep/SciFit for endurance. BLE strength.    PT Home Exercise Plan [facility]    Consulted and Agree with Plan of Care Patient             Patient will benefit from skilled therapeutic intervention in order to improve the following deficits and impairments:  Abnormal gait, Decreased activity tolerance, Decreased balance, Decreased coordination, Decreased endurance, Decreased knowledge of use of DME, Difficulty walking, Decreased strength, Dizziness, Impaired sensation, Postural dysfunction  Visit Diagnosis: Unsteadiness on feet  Muscle weakness (generalized)  Other symptoms and signs involving the nervous system     Problem List Patient Active Problem List   Diagnosis Date Noted   Acute CVA (cerebrovascular accident) ([place]) 12/29/2020   Peripheral arterial disease ([place]) 12/20/2020   History of esophageal stricture 07/08/2020   Insomnia 12/02/2017   Chronic cough 08/14/2017   Poor appetite 08/29/2016   Peptic stricture of esophagus    History of colonic polyps    Hiatal hernia 03/16/2016   Odynophagia 03/16/2016   Dysphagia 03/16/2016   Alcohol abuse 10/26/2015   Essential hypertension 09/26/2015   PAC (premature atrial contraction) 10/11/2014   PVC (premature ventricular contraction) 10/11/2014   Back muscle spasm 06/15/2014   Generalized anxiety disorder 06/15/2014   Smoking 06/15/2014   Chronic hepatitis C without hepatic coma ([place]) 05/28/2013    Arliss Journey, PT, DPT  09/12/2021, 10:15 AM  [facility]  [place], [place],  [address] Phone: [phone]   Fax:  [phone]  Name: DELLANIRA DILLOW MRN: [id number] Date of Birth: [birth year]

## 2021-09-13 ENCOUNTER — Encounter: Payer: Self-pay | Admitting: Physical Therapy

## 2021-09-13 ENCOUNTER — Ambulatory Visit: Payer: Medicare HMO

## 2021-09-13 ENCOUNTER — Ambulatory Visit: Payer: Medicare HMO | Admitting: Physical Therapy

## 2021-09-13 DIAGNOSIS — R41841 Cognitive communication deficit: Secondary | ICD-10-CM

## 2021-09-13 DIAGNOSIS — R29818 Other symptoms and signs involving the nervous system: Secondary | ICD-10-CM

## 2021-09-13 DIAGNOSIS — R482 Apraxia: Secondary | ICD-10-CM

## 2021-09-13 DIAGNOSIS — I69851 Hemiplegia and hemiparesis following other cerebrovascular disease affecting right dominant side: Secondary | ICD-10-CM

## 2021-09-13 DIAGNOSIS — R2681 Unsteadiness on feet: Secondary | ICD-10-CM | POA: Diagnosis not present

## 2021-09-13 DIAGNOSIS — R471 Dysarthria and anarthria: Secondary | ICD-10-CM

## 2021-09-13 DIAGNOSIS — M6281 Muscle weakness (generalized): Secondary | ICD-10-CM

## 2021-09-13 NOTE — Therapy (Signed)
[facility] [address] [place], [place], [address] Phone: [phone]   Fax:  [phone]  Physical Therapy Treatment  Patient Details  Name: Kara Bailey MRN: [id number] Date of Birth: [birth year] Referring Provider (PT): Cipriano Mile, NP   Encounter Date: 09/13/2021   PT End of Session - 09/13/21 0852     Visit Number 23    Number of Visits 29    Date for PT Re-Evaluation 76/73/41   per recert on 93/79/02   Authorization Type [employer] - awaiting new auth.    Authorization Time Period 16 visits from 67/22- 07/17/21, 12 visits through 06/09/21 - 08/08/21    Progress Note Due on Visit 20    PT Start Time 0849    PT Stop Time 0930    PT Time Calculation (min) 41 min    Activity Tolerance Patient tolerated treatment well;Patient limited by fatigue    Behavior During Therapy [facility] for tasks assessed/performed             Past Medical History:  Diagnosis Date   Anxiety    Chest pain 08/2014   unspecified   Cirrhosis ([place])    Depression    Dizziness 08/2014   Dyspnea 08/2014   Emphysema lung (HCC)    GERD (gastroesophageal reflux disease)    Heart murmur    Hepatitis C    Hiatal hernia    Hypertension    PAC (premature atrial contraction) 10/11/2014   PAD (peripheral artery disease) (HCC)    Palpitations    PVC (premature ventricular contraction) 10/11/2014   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   Stroke ([place])    just slurred speech   Tachycardia 08/2014   Ventral hernia    Weakness 08/2014    Past Surgical History:  Procedure Laterality Date   ABDOMINAL AORTOGRAM W/LOWER EXTREMITY N/A 07/13/2021   Procedure: ABDOMINAL AORTOGRAM W/LOWER EXTREMITY;  Surgeon: Lorretta Harp, MD;  Location: [place] CV LAB;  Service: Cardiovascular;  Laterality: N/A;   APPENDECTOMY     CATARACT EXTRACTION, BILATERAL     COLONOSCOPY     COLONOSCOPY WITH PROPOFOL N/A 04/30/2016   Procedure:  COLONOSCOPY WITH PROPOFOL;  Surgeon: Daneil Dolin, MD;  [facility];  Service: Endoscopy;  Laterality: N/A;  1230   ESOPHAGOGASTRODUODENOSCOPY     approximately 2010   ESOPHAGOGASTRODUODENOSCOPY (EGD) WITH PROPOFOL N/A 04/30/2016   Procedure: ESOPHAGOGASTRODUODENOSCOPY (EGD) WITH PROPOFOL;  Surgeon: Daneil Dolin, MD;  [facility];  Service: Endoscopy;  Laterality: N/A;   LEFT HEART CATHETERIZATION WITH CORONARY ANGIOGRAM N/A 10/29/2014   07-14-20- pt denies this Procedure: LEFT HEART CATHETERIZATION WITH CORONARY ANGIOGRAM;  Surgeon: Burnell Blanks, MD;  Location: [facility] CATH LAB;  Service: Cardiovascular;  Laterality: N/A;   MALONEY DILATION N/A 04/30/2016   Procedure: Venia Minks DILATION;  Surgeon: Daneil Dolin, MD;  [facility];  Service: Endoscopy;  Laterality: N/A;   PERIPHERAL VASCULAR INTERVENTION  07/13/2021   Procedure: PERIPHERAL VASCULAR INTERVENTION;  Surgeon: Lorretta Harp, MD;  Location: [place] CV LAB;  Service: Cardiovascular;;  left SFA left external iliac   POLYPECTOMY  04/30/2016   Procedure: POLYPECTOMY;  Surgeon: Daneil Dolin, MD;  [facility];  Service: Endoscopy;;  Sigmoid colon polyp removed via hot snare   UPPER GASTROINTESTINAL ENDOSCOPY      There were no vitals filed for this visit.   Subjective Assessment - 09/13/21 0851     Subjective  Was pretty tired after yesterday.    Pertinent History smoking, PAD, HTN, ETOH abuse, chronic hepatitis C with hepatic coma,GAD, esophageal stricture    Limitations Walking;Standing    Patient Stated Goals wants to work on walking normally    Currently in Pain? No/denies    Pain Onset More than a month ago                               [facility] Adult PT Treatment/Exercise - 09/13/21 0855       Knee/Hip Exercises: Aerobic   Nustep With BLE/BUE at gear 1 for strengthening/activity tolerance for 6 minutes             Vestibular  Treatment/Exercise - 09/13/21 0923       Vestibular Treatment/Exercise   Vestibular Treatment Provided Gaze    Gaze Exercises X1 Viewing Horizontal;X1 Viewing Vertical      X1 Viewing Horizontal   Foot Position seated feet apart    Comments 3 x 20 seconds, mild dizziness      X1 Viewing Vertical   Foot Position seated feet apart    Comments 2 x 20 seconds, 5/10 dizziness                Balance Exercises - 09/13/21 0905       Balance Exercises: Standing   Standing Eyes Closed Foam/compliant surface    Standing Eyes Closed Limitations feet closer than hip width 3  x 30 seconds    SLS Eyes open;Solid surface;Foam/compliant surface    SLS Limitations alternating SLS taps to 6" step no UE support x10 reps, then performed x10 reps on air ex - UE support to none    Sit to Stand Foam/compliant surface;Limitations    Sit to Stand Limitations From mat table on air ex, using BUE support to stand, x5 reps, takes incr time. Min guard for balance once in standing                  PT Short Term Goals - 09/11/21 1714       PT SHORT TERM GOAL #1   Title ALL STGS = LTGS               PT Long Term Goals - 09/11/21 1715       PT LONG TERM GOAL #1   Title Pt will be independent with final HEP and walking program with rollator in order to build upon functional gains made in therapy ALL LTGS DUE 10/09/21    Baseline pt reports not performing her HEP since she was last here    Time 4    Period Weeks    Status Revised    Target Date 10/09/21      PT LONG TERM GOAL #3   Title Pt will decr 5x sit <> stand time to 21 seconds or less with no UE support in order to demo improved BLE strength and balance.    Baseline 23.75 seconds from standard height chair on 06/09/21; 24.69 seconds with no UE support on 09/08/21    Time 4    Period Weeks    Status Revised      PT LONG TERM GOAL #4   Title Pt will improve 3MWT distance to at least 380' with no AD vs. rollator and no rest  breaks in order to demo improved walking endurance and gait efficiency.    Baseline 3MWT - 360' on 06/15/21;  320' on 09/08/21 with 2 standing rest breaks    Time 4    Period Weeks    Status Revised      PT LONG TERM GOAL #6   Title Pt will improve FOTO score to at least 60% in order to demo improved functional outcomes.    Baseline 50%; 52.4% on 06/06/21    Time 4    Period Weeks    Status On-going                   Plan - 09/13/21 0910     Clinical Impression Statement Today's skilled session focused on BLE strengthening and standing balance strategies including unlevel surfaces and SLS tasks. Pt challenged by performing sit <> stands with BUE support from mat table on air ex, took incr time to perform and steady in standing. Trialed VOR x1 exercises in seated today for gaze stabilization due to pt having incr dizziness when performing head motions and also with simulated VOR x1 at re-eval. pt reporting mild dizziness with head turns, but 5/10 dizziness with head nods that subsided quickly. Will continue to progress towards LTGs.    Personal Factors and Comorbidities Comorbidity 3+;Past/Current Experience    Comorbidities smoking, PAD, HTN, ETOH abuse, chronic hepatitis C with hepatic coma,GAD, esophageal stricture    Examination-Activity Limitations Locomotion Level;Transfers;Stairs;Squat;Carry    Examination-Participation Restrictions Cleaning;Community Activity;Laundry    Stability/Clinical Decision Making Stable/Uncomplicated    Rehab Potential Good    PT Frequency 2x / week   12 visits over 8 weeks   PT Duration 8 weeks    PT Treatment/Interventions ADLs/Self Care Home Management;DME Instruction;Gait training;Stair training;Therapeutic activities;Functional mobility training;Therapeutic exercise;Balance training;Neuromuscular re-education;Orthotic Fit/Training;Patient/family education;Vestibular    PT Next Visit Plan trying to progress VOR x1. walking with rollator/no AD.  weight shifting activities for LLE. NuStep/SciFit for endurance. BLE strength. standing balance on unlevle surfaces. how is HEP and walking program going at home?    PT Home Exercise Plan [facility]    Consulted and Agree with Plan of Care Patient             Patient will benefit from skilled therapeutic intervention in order to improve the following deficits and impairments:  Abnormal gait, Decreased activity tolerance, Decreased balance, Decreased coordination, Decreased endurance, Decreased knowledge of use of DME, Difficulty walking, Decreased strength, Dizziness, Impaired sensation, Postural dysfunction  Visit Diagnosis: Unsteadiness on feet  Muscle weakness (generalized)  Other symptoms and signs involving the nervous system  Hemiplegia and hemiparesis following other cerebrovascular disease affecting right dominant side [facility])     Problem List Patient Active Problem List   Diagnosis Date Noted   Acute CVA (cerebrovascular accident) ([place]) 12/29/2020   Peripheral arterial disease ([place]) 12/20/2020   History of esophageal stricture 07/08/2020   Insomnia 12/02/2017   Chronic cough 08/14/2017   Poor appetite 08/29/2016   Peptic stricture of esophagus    History of colonic polyps    Hiatal hernia 03/16/2016   Odynophagia 03/16/2016   Dysphagia 03/16/2016   Alcohol abuse 10/26/2015   Essential hypertension 09/26/2015   PAC (premature atrial contraction) 10/11/2014   PVC (premature ventricular contraction) 10/11/2014   Back muscle spasm 06/15/2014   Generalized anxiety disorder 06/15/2014   Smoking 06/15/2014   Chronic hepatitis C without hepatic coma ([place]) 05/28/2013    Arliss Journey, PT, DPT  09/13/2021, 9:32 AM  [facility] [address] [place], [place], [address] Phone: [phone]   Fax:  [phone]  Name: Kara Bailey MRN: [id number] Date of Birth: 11-Mar-1956

## 2021-09-13 NOTE — Therapy (Signed)
Kara Bailey [address] [place], [place], [address] Phone: [phone]   Fax:  [phone]  Speech Language Pathology Treatment/Discharge Summary  Patient Details  Name: Kara Bailey MRN: [id number] Date of Birth: [birth year] Referring Provider (SLP): Cipriano Mile, NP   Encounter Date: 09/13/2021   End of Session - 09/13/21 1008     Visit Number 26    Number of Visits 31    Date for SLP Re-Evaluation 09/15/21    Authorization Type [employer]    Authorization Time Period 8 visits 08/09/21-09/13/21    SLP Start Time 1018    SLP Stop Time  53    SLP Time Calculation (min) 32 min    Activity Tolerance Patient tolerated treatment well             Past Medical History:  Diagnosis Date   Anxiety    Chest pain 08/2014   unspecified   Cirrhosis ([place])    Depression    Dizziness 08/2014   Dyspnea 08/2014   Emphysema lung (H[facility])    GERD (gastroesophageal reflux disease)    Heart murmur    Hepatitis C    Hiatal hernia    Hypertension    PAC (premature atrial contraction) 10/11/2014   PAD (peripheral artery disease) (HCC)    Palpitations    PVC (premature ventricular contraction) 10/11/2014   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   Stroke ([place])    just slurred speech   Tachycardia 08/2014   Ventral hernia    Weakness 08/2014    Past Surgical History:  Procedure Laterality Date   ABDOMINAL AORTOGRAM W/LOWER EXTREMITY N/A 07/13/2021   Procedure: ABDOMINAL AORTOGRAM W/LOWER EXTREMITY;  Surgeon: Lorretta Harp, MD;  Location: [facility];  Service: Cardiovascular;  Laterality: N/A;   APPENDECTOMY     CATARACT EXTRACTION, BILATERAL     COLONOSCOPY     COLONOSCOPY WITH PROPOFOL N/A 04/30/2016   Procedure: COLONOSCOPY WITH PROPOFOL;  Surgeon: Daneil Dolin, MD;  [facility];  Service: Endoscopy;  Laterality: N/A;  1230   ESOPHAGOGASTRODUODENOSCOPY     approximately 2010    ESOPHAGOGASTRODUODENOSCOPY (EGD) WITH PROPOFOL N/A 04/30/2016   Procedure: ESOPHAGOGASTRODUODENOSCOPY (EGD) WITH PROPOFOL;  Surgeon: Daneil Dolin, MD;  [facility];  Service: Endoscopy;  Laterality: N/A;   LEFT HEART CATHETERIZATION WITH CORONARY ANGIOGRAM N/A 10/29/2014   07-14-20- pt denies this Procedure: LEFT HEART CATHETERIZATION WITH CORONARY ANGIOGRAM;  Surgeon: Burnell Blanks, MD;  Location: [facility];  Service: Cardiovascular;  Laterality: N/A;   MALONEY DILATION N/A 04/30/2016   Procedure: Venia Minks DILATION;  Surgeon: Daneil Dolin, MD;  [facility];  Service: Endoscopy;  Laterality: N/A;   PERIPHERAL VASCULAR INTERVENTION  07/13/2021   Procedure: PERIPHERAL VASCULAR INTERVENTION;  Surgeon: Lorretta Harp, MD;  Location: [facility];  Service: Cardiovascular;;  left SFA left external iliac   POLYPECTOMY  04/30/2016   Procedure: POLYPECTOMY;  Surgeon: Daneil Dolin, MD;  [facility];  Service: Endoscopy;;  Sigmoid colon polyp removed via hot snare   UPPER GASTROINTESTINAL ENDOSCOPY      There were no vitals filed for this visit.   Subjective Assessment - 09/13/21 1020     Subjective "I just did PT. Can't you tell?"    Currently in Pain? No/denies             SPEECH THERAPY DISCHARGE SUMMARY  Visits from Start of Care: 38  Current functional level related to goals / functional outcomes: [name] is discharging from skilled ST targeting cognition, apraxia, and dysarthria as pt has maximized rehab potential at this time. Pt is pleased with speech as pt is now more intelligible and fluent with use of simplified compensations. Pt reports some persistent cognitive deficits, which may be related to prior baseline. Cognitive compensations and recommendations provided and utilized with success. No further skilled ST intervention warranted at this time. Pt denied any questions.    Remaining deficits: Mild apraxia of speech, mild  to mod cognitive deficits   Education / Equipment: Dysarthria/apraxia compensations, functional recommendations, structured cognitive tasks, external aids   Patient agrees to discharge. Patient goals were partially met. Patient is being discharged due to maximized rehab potential. .         ADULT SLP TREATMENT - 09/13/21 1007       General Information   Behavior/Cognition Alert;Cooperative;Pleasant mood      Treatment Provided   Treatment provided Cognitive-Linquistic      Cognitive-Linquistic Treatment   Treatment focused on Cognition    Skilled Treatment Pt is compensating for memory by writing down information, especially for meal planning and managing appointments. No other overt cognitive concerns reported in discussion related to functional cognition. SLP instructed patient to insert appointments into phone calendar as that is pt preferred way to manage appointments. Pt completed task with rare min A.  SLP cued initial double check during process to ensure accuracy. Discharge completed and reviewed with patient. Pt verbalized understanding and agreement with ST discharge this date.      Assessment / Recommendations / Plan   Plan Discharge SLP treatment due to (comment)   POC complete     Progression Toward Goals   Progression toward goals Goals partially met, education completed, patient discharged from [facility] - 09/13/21 1021     Education Details discharge summary, review of compensations    Person(s) Educated Patient    Methods Explanation;Demonstration    Comprehension Verbalized understanding;Returned demonstration              SLP Short Term Goals - 07/21/21 1156       SLP SHORT TERM GOAL #1   Title Pt will demonstrate dysarthria compensations in 10 minute simple to mod complex conversation with min A over 2 sessions    Status Not Met      SLP SHORT TERM GOAL #2   Title Pt will use memory/attention compensations for  appointments, medicine management, and other daily activities with occasional min A over 3 sessions.    Baseline 05-12-21    Status Partially Met      SLP SHORT TERM GOAL #3   Title Pt will ID and correct word finding errors with 80% accuracy given occasional min A over 2 sessions    Baseline 05-12-21    Status Partially Met      SLP SHORT TERM GOAL #4   Title Pt will verbalize 3 safety concerns in the home and generate solutions to them with occasional min A over 2 sessions    Baseline 05-12-21    Status Partially Met      SLP SHORT TERM GOAL #5   Title Pt will complete dysphagia HEP to optimize swallow function with occasional min A over 2 sessions    Status Not Met      SLP SHORT TERM GOAL #6   Title Pt  will exhibit improved oral coordination on targeted apraxia tasks with occasional min A over 2 sessions    Status Not Met      SLP SHORT TERM GOAL #7   Title Pt will generate abdominal breathing in 16/20 answers to SLP questions over 2 sessions    Status Not Met    Target Date 07/07/21      SLP SHORT TERM GOAL #8   Title Pt will be 90% intelligible in response to open ended SLP questions with rare min A over 2 sessions    Status Partially Met    Target Date 07/07/21              SLP Long Term Goals - 09/13/21 1033       SLP LONG TERM GOAL #1   Title Pt will demonstrate dysarthria and apraxia compensations in 10 minute simple conversation with occasional min A over 2 sessions    Baseline 09-08-21, 09-13-21    Time --    Status Achieved      SLP LONG TERM GOAL #2   Title Pt will use 2 memory/attention compensations for appointments, medicine management, and other daily activities with occasional min A over 2 sessions.    Baseline 09-08-21, 09-13-21    Time --    Status Achieved      SLP LONG TERM GOAL #3   Title Pt will ID and correct word finding errors with 95% accuracy given occasional min A over 2 sessions    Status Partially Met      SLP LONG TERM GOAL #4    Title Pt will report improved diet tolerance and reduced s/sx of aspiration with rare min over 4 sessions    Baseline 06-09-21, 07-21-21    Status Partially Met      SLP LONG TERM GOAL #5   Title Pt will undergo repeat objective swallow study as needed if s/sx of aspiration persist or change in respiration indicated    Status Deferred      SLP LONG TERM GOAL #6   Title Pt will generate abdominal breathing throughout 10 minute conversation occasional min A over 2 sessions    Status Not Met              Plan - 09/13/21 1008     Clinical Impression Statement Kambrey reports speech has improved by focusing on slow rate and deep breath. Pt has used SLP recommendations to advocate for her speech changes and used recommendations to aid functional communication in various opportunities. SLP reviewed cognitive strategies to optimize daily functioning. Pt and SLP believe pt has maximized rehab potenital at this time.    Treatment/Interventions Aspiration precaution training;Oral motor exercises;Compensatory strategies;Pharyngeal strengthening exercises;Functional tasks;Patient/family education;Diet toleration management by SLP;Cognitive reorganization;Multimodal communcation approach;Language facilitation;Compensatory techniques;Internal/external aids;SLP instruction and feedback    Potential to Achieve Goals Fair    Potential Considerations Ability to learn/carryover information;Severity of impairments    SLP Home Exercise Plan provided    Consulted and Agree with Plan of Care Patient             Patient will benefit from skilled therapeutic intervention in order to improve the following deficits and impairments:   Cognitive communication deficit  Dysarthria and anarthria  Verbal apraxia    Problem List Patient Active Problem List   Diagnosis Date Noted   Acute CVA (cerebrovascular accident) ([place]) 12/29/2020   Peripheral arterial disease ([place]) 12/20/2020   History of esophageal  stricture 07/08/2020   Insomnia 12/02/2017  Chronic cough 08/14/2017   Poor appetite 08/29/2016   Peptic stricture of esophagus    History of colonic polyps    Hiatal hernia 03/16/2016   Odynophagia 03/16/2016   Dysphagia 03/16/2016   Alcohol abuse 10/26/2015   Essential hypertension 09/26/2015   PAC (premature atrial contraction) 10/11/2014   PVC (premature ventricular contraction) 10/11/2014   Back muscle spasm 06/15/2014   Generalized anxiety disorder 06/15/2014   Smoking 06/15/2014   Chronic hepatitis C without hepatic coma ([facility]) 05/28/2013    Alinda Deem, MA CCC-SLP 09/13/2021, 10:44 AM  [facility] [address] [place], [place], [address] Phone: [phone]   Fax:  [phone]   Name: Kara Bailey MRN: [id number] Date of Birth: [birth year]

## 2021-09-19 ENCOUNTER — Encounter: Payer: Self-pay | Admitting: Physical Therapy

## 2021-09-19 ENCOUNTER — Other Ambulatory Visit: Payer: Self-pay

## 2021-09-19 ENCOUNTER — Ambulatory Visit: Payer: Medicare HMO | Admitting: Physical Therapy

## 2021-09-19 DIAGNOSIS — R29818 Other symptoms and signs involving the nervous system: Secondary | ICD-10-CM

## 2021-09-19 DIAGNOSIS — M6281 Muscle weakness (generalized): Secondary | ICD-10-CM

## 2021-09-19 DIAGNOSIS — R2681 Unsteadiness on feet: Secondary | ICD-10-CM | POA: Diagnosis not present

## 2021-09-19 NOTE — Therapy (Signed)
Kara Bailey [address] [place], [place], [address] Phone: [phone]   Fax:  [phone]  Physical Therapy Treatment  Patient Details  Name: Kara Bailey MRN: [id number] Date of Birth: 11/12/1956 Referring Provider (PT): Cipriano Mile, NP   Encounter Date: 09/19/2021   PT End of Session - 09/19/21 0933     Visit Number 24    Number of Visits 29    Date for PT Re-Evaluation 81/85/63   per recert on 14/97/02   Authorization Type [employer] - awaiting new auth.    Authorization Time Period 16 visits from 67/22- 07/17/21, 12 visits through 06/09/21 - 08/08/21    Progress Note Due on Visit 20    PT Start Time 0930    PT Stop Time 1011    PT Time Calculation (min) 41 min    Activity Tolerance Patient tolerated treatment well;Patient limited by fatigue    Behavior During Therapy [facility] for tasks assessed/performed             Past Medical History:  Diagnosis Date   Anxiety    Chest pain 08/2014   unspecified   Cirrhosis ([place])    Depression    Dizziness 08/2014   Dyspnea 08/2014   Emphysema lung (HCC)    GERD (gastroesophageal reflux disease)    Heart murmur    Hepatitis C    Hiatal hernia    Hypertension    PAC (premature atrial contraction) 10/11/2014   PAD (peripheral artery disease) (HCC)    Palpitations    PVC (premature ventricular contraction) 10/11/2014   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   Stroke ([place])    just slurred speech   Tachycardia 08/2014   Ventral hernia    Weakness 08/2014    Past Surgical History:  Procedure Laterality Date   ABDOMINAL AORTOGRAM W/LOWER EXTREMITY N/A 07/13/2021   Procedure: ABDOMINAL AORTOGRAM W/LOWER EXTREMITY;  Surgeon: Lorretta Harp, MD;  Location: [facility];  Service: Cardiovascular;  Laterality: N/A;   APPENDECTOMY     CATARACT EXTRACTION, BILATERAL     COLONOSCOPY     COLONOSCOPY WITH PROPOFOL N/A 04/30/2016   Procedure:  COLONOSCOPY WITH PROPOFOL;  Surgeon: Daneil Dolin, MD;  [facility];  Service: Endoscopy;  Laterality: N/A;  1230   ESOPHAGOGASTRODUODENOSCOPY     approximately 2010   ESOPHAGOGASTRODUODENOSCOPY (EGD) WITH PROPOFOL N/A 04/30/2016   Procedure: ESOPHAGOGASTRODUODENOSCOPY (EGD) WITH PROPOFOL;  Surgeon: Daneil Dolin, MD;  [facility];  Service: Endoscopy;  Laterality: N/A;   LEFT HEART CATHETERIZATION WITH CORONARY ANGIOGRAM N/A 10/29/2014   07-14-20- pt denies this Procedure: LEFT HEART CATHETERIZATION WITH CORONARY ANGIOGRAM;  Surgeon: Burnell Blanks, MD;  Location: [facility];  Service: Cardiovascular;  Laterality: N/A;   MALONEY DILATION N/A 04/30/2016   Procedure: Venia Minks DILATION;  Surgeon: Daneil Dolin, MD;  [facility];  Service: Endoscopy;  Laterality: N/A;   PERIPHERAL VASCULAR INTERVENTION  07/13/2021   Procedure: PERIPHERAL VASCULAR INTERVENTION;  Surgeon: Lorretta Harp, MD;  Location: [facility];  Service: Cardiovascular;;  left SFA left external iliac   POLYPECTOMY  04/30/2016   Procedure: POLYPECTOMY;  Surgeon: Daneil Dolin, MD;  [facility];  Service: Endoscopy;;  Sigmoid colon polyp removed via hot snare   UPPER GASTROINTESTINAL ENDOSCOPY      There were no vitals filed for this visit.   Subjective Assessment - 09/19/21 0934     Subjective  Had an episode of dizziness over the weekend, lasted about an hour. Reports her BP during this episode was 160/79. Dizziness came out of nowhere. Reports that she felt like she was on a rollercoaster. Thinks that she wasn't drinking enough water. Exercises are going well at home - doing her walking. Was very sore after the last session.    Pertinent History smoking, PAD, HTN, ETOH abuse, chronic hepatitis C with hepatic coma,GAD, esophageal stricture    Limitations Walking;Standing    Patient Stated Goals wants to work on walking normally    Currently in Pain? No/denies     Pain Onset More than a month ago                               [facility] Adult PT Treatment/Exercise - 09/19/21 0942       Exercises   Exercises Other Exercises    Other Exercises  x5 reps mini squats with BUE support at chair - needing verbal/demo cues for technique      Knee/Hip Exercises: Aerobic   Nustep With BLE/BUE at gear 1 for strengthening/activity tolerance for 6 minutes                 Balance Exercises - 09/19/21 0949       Balance Exercises: Standing   SLS Eyes open;Foam/compliant surface    SLS Limitations Alternating SLS taps on air ex to 6" step, 2 x 10 reps with UE support > none    Standing, One Foot on a Step Eyes open;Foam/compliant surface;6 inch    Standing, One Foot on a Step Limitations On air ex with foot on 6" step; performed with both legs, alternating UE lifts x3 reps, then trunk rotations x3 reps with reaching towards targets, cues for glute activation during SLS.    Rockerboard Anterior/posterior;Lateral;EO;Limitations    Rockerboard Limitations Each direction - x12 reps weight shifting working on no UE support, cues for slowed and controlled when performing and keeping a soft knee    Other Standing Exercises With air ex: x10 reps step up/up and down/down alternating each leg, incr difficulty with LLE, no UE support                  PT Short Term Goals - 09/11/21 1714       PT SHORT TERM GOAL #1   Title ALL STGS = LTGS               PT Long Term Goals - 09/11/21 1715       PT LONG TERM GOAL #1   Title Pt will be independent with final HEP and walking program with rollator in order to build upon functional gains made in therapy ALL LTGS DUE 10/09/21    Baseline pt reports not performing her HEP since she was last here    Time 4    Period Weeks    Status Revised    Target Date 10/09/21      PT LONG TERM GOAL #3   Title Pt will decr 5x sit <> stand time to 21 seconds or less with no UE support in order to  demo improved BLE strength and balance.    Baseline 23.75 seconds from standard height chair on 06/09/21; 24.69 seconds with no UE support on 09/08/21    Time 4    Period Weeks    Status Revised      PT LONG TERM GOAL #  4   Title Pt will improve 3MWT distance to at least 380' with no AD vs. rollator and no rest breaks in order to demo improved walking endurance and gait efficiency.    Baseline 3MWT - 360' on 06/15/21; 320' on 09/08/21 with 2 standing rest breaks    Time 4    Period Weeks    Status Revised      PT LONG TERM GOAL #6   Title Pt will improve FOTO score to at least 60% in order to demo improved functional outcomes.    Baseline 50%; 52.4% on 06/06/21    Time 4    Period Weeks    Status On-going                   Plan - 09/19/21 0941     Clinical Impression Statement Today's skilled session focused on BLE strengthening and standing balance strategies on unlevel surfaces working on weight shifting and SLS tasks. Pt didn't need of frequent as rest breaks during today's session. Pt more fatigued when performing mini squats at end of session. Will continue to progress towards LTGs.    Personal Factors and Comorbidities Comorbidity 3+;Past/Current Experience    Comorbidities smoking, PAD, HTN, ETOH abuse, chronic hepatitis C with hepatic coma,GAD, esophageal stricture    Examination-Activity Limitations Locomotion Level;Transfers;Stairs;Squat;Carry    Examination-Participation Restrictions Cleaning;Community Activity;Laundry    Stability/Clinical Decision Making Stable/Uncomplicated    Rehab Potential Good    PT Frequency 2x / week   12 visits over 8 weeks   PT Duration 8 weeks    PT Treatment/Interventions ADLs/Self Care Home Management;DME Instruction;Gait training;Stair training;Therapeutic activities;Functional mobility training;Therapeutic exercise;Balance training;Neuromuscular re-education;Orthotic Fit/Training;Patient/family education;Vestibular    PT Next Visit  Plan perform further vestibular assessment - pt also got dizzy with VOR x1 exercise.  walking with rollator/no AD. weight shifting activities for LLE. NuStep/SciFit for endurance. BLE strength. standing balance on unlevel surfaces.    PT Home Exercise Plan [facility]    Consulted and Agree with Plan of Care Patient             Patient will benefit from skilled therapeutic intervention in order to improve the following deficits and impairments:  Abnormal gait, Decreased activity tolerance, Decreased balance, Decreased coordination, Decreased endurance, Decreased knowledge of use of DME, Difficulty walking, Decreased strength, Dizziness, Impaired sensation, Postural dysfunction  Visit Diagnosis: Unsteadiness on feet  Muscle weakness (generalized)  Other symptoms and signs involving the nervous system     Problem List Patient Active Problem List   Diagnosis Date Noted   Acute CVA (cerebrovascular accident) ([place]) 12/29/2020   Peripheral arterial disease ([place]) 12/20/2020   History of esophageal stricture 07/08/2020   Insomnia 12/02/2017   Chronic cough 08/14/2017   Poor appetite 08/29/2016   Peptic stricture of esophagus    History of colonic polyps    Hiatal hernia 03/16/2016   Odynophagia 03/16/2016   Dysphagia 03/16/2016   Alcohol abuse 10/26/2015   Essential hypertension 09/26/2015   PAC (premature atrial contraction) 10/11/2014   PVC (premature ventricular contraction) 10/11/2014   Back muscle spasm 06/15/2014   Generalized anxiety disorder 06/15/2014   Smoking 06/15/2014   Chronic hepatitis C without hepatic coma ([place]) 05/28/2013    Kara Bailey, PT, DPT  09/19/2021, 10:15 AM  [facility] [address] [place], [place], [address] Phone: [phone]   Fax:  [phone]  Name: MICHIAH MASSE MRN: [id number] Date of Birth: September 19, 1956

## 2021-09-21 ENCOUNTER — Ambulatory Visit: Payer: Medicare HMO | Admitting: Physical Therapy

## 2021-09-21 NOTE — Patient Instructions (Signed)
DUE TO COVID-19 ONLY ONE VISITOR IS ALLOWED TO COME WITH YOU AND STAY IN THE WAITING ROOM ONLY DURING PRE OP AND PROCEDURE.   **NO VISITORS ARE ALLOWED IN THE SHORT STAY AREA OR RECOVERY ROOM!!**  IF YOU WILL BE ADMITTED INTO THE HOSPITAL YOU ARE ALLOWED ONLY TWO SUPPORT PEOPLE DURING VISITATION HOURS ONLY (7 AM -8PM)   The support person(s) must pass our screening, gel in and out, and wear a mask at all times, including in the patient's room. Patients must also wear a mask when staff or their support person are in the room. Visitors GUEST BADGE MUST BE WORN VISIBLY  One adult visitor may remain with you overnight and MUST be in the room by 8 P.M.  No visitors under the age of 9. Any visitor under the age of 71 must be accompanied by an adult.    Your procedure is scheduled on: 10/13/21   Report to [facility] Main Entrance    Report to short stay at : 5:15 AM   Call this number if you have problems the morning of surgery [phone]   Do not eat food :After Midnight.   May have liquids until: 4:30 AM    day of surgery  CLEAR LIQUID DIET  Foods Allowed                                                                     Foods Excluded  Water, Black Coffee and tea, regular and decaf                             liquids that you cannot  Plain Jell-O in any flavor  (No red)                                           see through such as: Fruit ices (not with fruit pulp)                                     milk, soups, orange juice              Iced Popsicles (No red)                                    All solid food                                   Apple juices Sports drinks like Gatorade (No red) Lightly seasoned clear broth or consume(fat free) Sugar  Sample Menu Breakfast                                Lunch  Supper Cranberry juice                    Beef broth                            Chicken broth Jell-O                                      Grape juice                           Apple juice Coffee or tea                        Jell-O                                      Popsicle                                                Coffee or tea                        Coffee or tea     Oral Hygiene is also important to reduce your risk of infection.                                    Remember - BRUSH YOUR TEETH THE MORNING OF SURGERY WITH YOUR REGULAR TOOTHPASTE   Do NOT smoke after Midnight   Take these medicines the morning of surgery with A SIP OF WATER: escitalopram,diltiazem,pantoprazole.Hydroxyzine as needed.                              You may not have any metal on your body including hair pins, jewelry, and body piercing             Do not wear make-up, lotions, powders, perfumes/cologne, or deodorant  Do not wear nail polish including gel and S&S, artificial/acrylic nails, or any other type of covering on natural nails including finger and toenails. If you have artificial nails, gel coating, etc. that needs to be removed by a nail salon please have this removed prior to surgery or surgery may need to be canceled/ delayed if the surgeon/ anesthesia feels like they are unable to be safely monitored.   Do not shave  48 hours prior to surgery.    Do not bring valuables to the hospital. [place].   Contacts, dentures or bridgework may not be worn into surgery.   Bring small overnight bag day of surgery.    Patients discharged on the day of surgery will not be allowed to drive home.   Special Instructions: Bring a copy of your healthcare power of attorney and living will documents         the day of surgery if you haven't scanned them before.  Please read over the following fact sheets you were given: IF YOU HAVE QUESTIONS ABOUT YOUR PRE-OP INSTRUCTIONS PLEASE CALL [phone]     [facility] - Preparing for Surgery Before surgery, you can play an  important role.  Because skin is not sterile, your skin needs to be as free of germs as possible.  You can reduce the number of germs on your skin by washing with CHG (chlorahexidine gluconate) soap before surgery.  CHG is an antiseptic cleaner which kills germs and bonds with the skin to continue killing germs even after washing. Please DO NOT use if you have an allergy to CHG or antibacterial soaps.  If your skin becomes reddened/irritated stop using the CHG and inform your nurse when you arrive at Short Stay. Do not shave (including legs and underarms) for at least 48 hours prior to the first CHG shower.  You may shave your face/neck. Please follow these instructions carefully:  1.  Shower with CHG Soap the night before surgery and the  morning of Surgery.  2.  If you choose to wash your hair, wash your hair first as usual with your  normal  shampoo.  3.  After you shampoo, rinse your hair and body thoroughly to remove the  shampoo.                           4.  Use CHG as you would any other liquid soap.  You can apply chg directly  to the skin and wash                       Gently with a scrungie or clean washcloth.  5.  Apply the CHG Soap to your body ONLY FROM THE NECK DOWN.   Do not use on face/ open                           Wound or open sores. Avoid contact with eyes, ears mouth and genitals (private parts).                       Wash face,  Genitals (private parts) with your normal soap.             6.  Wash thoroughly, paying special attention to the area where your surgery  will be performed.  7.  Thoroughly rinse your body with warm water from the neck down.  8.  DO NOT shower/wash with your normal soap after using and rinsing off  the CHG Soap.                9.  Pat yourself dry with a clean towel.            10.  Wear clean pajamas.            11.  Place clean sheets on your bed the night of your first shower and do not  sleep with pets. Day of Surgery : Do not apply any  lotions/deodorants the morning of surgery.  Please wear clean clothes to the hospital/surgery center.  FAILURE TO FOLLOW THESE INSTRUCTIONS MAY RESULT IN THE CANCELLATION OF YOUR SURGERY PATIENT SIGNATURE_________________________________  NURSE SIGNATURE__________________________________  ________________________________________________________________________

## 2021-09-25 ENCOUNTER — Ambulatory Visit: Payer: Medicare HMO | Admitting: Physical Therapy

## 2021-09-26 ENCOUNTER — Encounter (HOSPITAL_COMMUNITY)
Admission: RE | Admit: 2021-09-26 | Discharge: 2021-09-26 | Disposition: A | Discharge status: Home / Self Care | Payer: Medicare HMO | Source: Ambulatory Visit | Attending: Surgery | Admitting: Surgery

## 2021-09-26 ENCOUNTER — Encounter (HOSPITAL_COMMUNITY): Payer: Self-pay

## 2021-09-26 ENCOUNTER — Other Ambulatory Visit: Payer: Self-pay

## 2021-09-26 VITALS — BP 168/88 | HR 103 | Temp 98.7°F | Resp 18 | Ht 66.0 in | Wt 153.2 lb

## 2021-09-26 DIAGNOSIS — I2510 Atherosclerotic heart disease of native coronary artery without angina pectoris: Secondary | ICD-10-CM | POA: Diagnosis not present

## 2021-09-26 DIAGNOSIS — Z01812 Encounter for preprocedural laboratory examination: Secondary | ICD-10-CM | POA: Insufficient documentation

## 2021-09-26 HISTORY — DX: Cardiac arrhythmia, unspecified: I49.9

## 2021-09-26 LAB — CBC
HCT: 43.9 % (ref 36.0–46.0)
Hemoglobin: 14.2 g/dL (ref 12.0–15.0)
MCH: 29.8 pg (ref 26.0–34.0)
MCHC: 32.3 g/dL (ref 30.0–36.0)
MCV: 92.2 fL (ref 80.0–100.0)
Platelets: 315 10*3/uL (ref 150–400)
RBC: 4.76 MIL/uL (ref 3.87–5.11)
RDW: 13.7 % (ref 11.5–15.5)
WBC: 7.4 10*3/uL (ref 4.0–10.5)
nRBC: 0 % (ref 0.0–0.2)

## 2021-09-26 LAB — BASIC METABOLIC PANEL
Anion gap: 7 (ref 5–15)
BUN: 11 mg/dL (ref 8–23)
CO2: 25 mmol/L (ref 22–32)
Calcium: 9.2 mg/dL (ref 8.9–10.3)
Chloride: 107 mmol/L (ref 98–111)
Creatinine, Ser: 1.1 mg/dL — ABNORMAL HIGH (ref 0.44–1.00)
GFR, Estimated: 55 mL/min — ABNORMAL LOW (ref >60)
Glucose, Bld: 104 mg/dL — ABNORMAL HIGH (ref 70–99)
Potassium: 4.1 mmol/L (ref 3.5–5.1)
Sodium: 139 mmol/L (ref 135–145)

## 2021-09-26 NOTE — Progress Notes (Addendum)
COVID Vaccine Completed: Yes Date COVID Vaccine completed: 2021 x 3 COVID vaccine manufacturer:   Moderna    COVID Test: N/A  PCP - DO: Andree Moro Cardiologist - Dr. Quay Burow. LOV: 07/05/21  Chest x-ray -  EKG - 08/09/21 Stress Test -  ECHO - 12/30/20 Cardiac Cath -  Pacemaker/ICD device last checked:  Sleep Study -  CPAP -   Fasting Blood Sugar -  Checks Blood Sugar _____ times a day  Blood Thinner Instructions: Plavix: To hold 5 days before surgery as per Dr. Harlow Asa instructions.  Aspirin Instructions: RN recommended to check with MD. About aspirin 81 mg instructions. Last Dose:  Anesthesia review: Hx: PAC,PVC,Tachycardia,Chest pain,Smoker,Emphysema,Heart murmur,HTN,PAD,Stroke.  Patient denies shortness of breath, fever, cough and chest pain at PAT appointment   Patient verbalized understanding of instructions that were given to them at the PAT appointment. Patient was also instructed that they will need to review over the PAT instructions again at home before surgery.

## 2021-09-28 ENCOUNTER — Ambulatory Visit: Payer: Medicare HMO | Admitting: Physical Therapy

## 2021-09-28 ENCOUNTER — Other Ambulatory Visit: Payer: Self-pay

## 2021-09-28 ENCOUNTER — Encounter: Payer: Self-pay | Admitting: Physical Therapy

## 2021-09-28 DIAGNOSIS — R2681 Unsteadiness on feet: Secondary | ICD-10-CM

## 2021-09-28 DIAGNOSIS — M6281 Muscle weakness (generalized): Secondary | ICD-10-CM

## 2021-09-28 DIAGNOSIS — R29818 Other symptoms and signs involving the nervous system: Secondary | ICD-10-CM

## 2021-09-28 NOTE — Therapy (Signed)
[facility] [address] [place], [place], [address] Phone: [phone]   Fax:  [phone]  Physical Therapy Treatment  Patient Details  Name: Kara Bailey MRN: [id number] Date of Birth: [birth year] Referring Provider (PT): Cipriano Mile, NP   Encounter Date: 09/28/2021   PT End of Session - 09/28/21 1023     Visit Number 25    Number of Visits 29    Date for PT Re-Evaluation 42/70/62   per recert on 37/62/83   Authorization Type [employer]    Authorization Time Period 16 visits from 67/22- 07/17/21, 12 visits through 06/09/21 - 08/08/21, 8 visits 09/08/21-11/07/21    Authorization - Visit Number 5    Authorization - Number of Visits 8    Progress Note Due on Visit 43    PT Start Time 1517    PT Stop Time 1015    PT Time Calculation (min) 44 min    Equipment Utilized During Treatment Gait belt    Activity Tolerance Patient tolerated treatment well;Patient limited by fatigue    Behavior During Therapy [facility] for tasks assessed/performed             Past Medical History:  Diagnosis Date   Anxiety    Chest pain 08/2014   unspecified   Cirrhosis ([place])    Depression    Dizziness 08/2014   Dyspnea 08/2014   Dysrhythmia    Emphysema lung ([place])    GERD (gastroesophageal reflux disease)    Heart murmur    Hepatitis C    Hiatal hernia    Hypertension    PAC (premature atrial contraction) 10/11/2014   PAD (peripheral artery disease) ([facility])    Palpitations    PVC (premature ventricular contraction) 10/11/2014   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   Stroke ([place])    just slurred speech   Tachycardia 08/2014   Ventral hernia    Weakness 08/2014    Past Surgical History:  Procedure Laterality Date   ABDOMINAL AORTOGRAM W/LOWER EXTREMITY N/A 07/13/2021   Procedure: ABDOMINAL AORTOGRAM W/LOWER EXTREMITY;  Surgeon: Lorretta Harp, MD;  Location: [facility];  Service: Cardiovascular;   Laterality: N/A;   APPENDECTOMY     CATARACT EXTRACTION, BILATERAL     COLONOSCOPY     COLONOSCOPY WITH PROPOFOL N/A 04/30/2016   Procedure: COLONOSCOPY WITH PROPOFOL;  Surgeon: Daneil Dolin, MD;  [facility];  Service: Endoscopy;  Laterality: N/A;  1230   ESOPHAGOGASTRODUODENOSCOPY     approximately 2010   ESOPHAGOGASTRODUODENOSCOPY (EGD) WITH PROPOFOL N/A 04/30/2016   Procedure: ESOPHAGOGASTRODUODENOSCOPY (EGD) WITH PROPOFOL;  Surgeon: Daneil Dolin, MD;  [facility];  Service: Endoscopy;  Laterality: N/A;   LEFT HEART CATHETERIZATION WITH CORONARY ANGIOGRAM N/A 10/29/2014   07-14-20- pt denies this Procedure: LEFT HEART CATHETERIZATION WITH CORONARY ANGIOGRAM;  Surgeon: Burnell Blanks, MD;  Location: [facility] CATH LAB;  Service: Cardiovascular;  Laterality: N/A;   MALONEY DILATION N/A 04/30/2016   Procedure: Venia Minks DILATION;  Surgeon: Daneil Dolin, MD;  [facility];  Service: Endoscopy;  Laterality: N/A;   PERIPHERAL VASCULAR INTERVENTION  07/13/2021   Procedure: PERIPHERAL VASCULAR INTERVENTION;  Surgeon: Lorretta Harp, MD;  Location: [facility];  Service: Cardiovascular;;  left SFA left external iliac   POLYPECTOMY  04/30/2016   Procedure: POLYPECTOMY;  Surgeon: Daneil Dolin, MD;  [facility];  Service: Endoscopy;;  Sigmoid colon polyp removed via hot snare  UPPER GASTROINTESTINAL ENDOSCOPY      There were no vitals filed for this visit.   Subjective Assessment - 09/28/21 0933     Subjective Pt reports a fall the day before yesterday. She reports falling in the floorboard of her car when trying to get into the car on Tuesday. "Back is really acting up today." Reports dizziness has been better lately, but hasn't been doing exercises.    Pertinent History smoking, PAD, HTN, ETOH abuse, chronic hepatitis C with hepatic coma,GAD, esophageal stricture    Limitations Walking;Standing    Patient Stated Goals wants to work on  walking normally    Currently in Pain? Yes    Pain Score 8     Pain Location Back    Pain Orientation Right;Left;Lower    Pain Descriptors / Indicators Spasm;Sharp    Pain Type Acute pain    Pain Onset More than a month ago    Multiple Pain Sites No                   [facility] Adult PT Treatment/Exercise - 09/28/21 0937       Transfers   Transfers Sit to Stand;Stand to Sit    Sit to Stand 5: Supervision;With upper extremity assist;Without upper extremity assist    Stand to Sit 5: Supervision;With upper extremity assist;Without upper extremity assist      Ambulation/Gait   Ambulation/Gait Yes    Ambulation/Gait Assistance 5: Supervision    Ambulation Distance (Feet) --   in/out of clinic and throughout session.   Assistive device None    Gait Pattern Step-through pattern;Decreased arm swing - left;Decreased arm swing - right;Decreased stance time - left;Lateral hip instability;Lateral trunk lean to right;Decreased trunk rotation    Ambulation Surface Level;Indoor      Knee/Hip Exercises: Stretches   Piriformis Stretch Both;2 reps;30 seconds;Limitations    Piriformis Stretch Limitations seated piriformis stretch with anterior trunk lean 2 x 30 sec each side    Other Knee/Hip Stretches seated hamstring stretch with strap and anterior trunk lean 2 x 30 sec each side.      Knee/Hip Exercises: Aerobic   Nustep With BLE/BUE at gear 1 for strengthening/activity tolerance for 8 minutes                 Balance Exercises - 09/28/21 1001       Balance Exercises: Standing   Other Standing Exercises Alternating taps in // bars with B UE support x 10 each side. Pt demonstrated lateral hip instability during stance phase on R, requiring cues to activate glutes to stand tall.                  PT Short Term Goals - 09/11/21 1714       PT SHORT TERM GOAL #1   Title ALL STGS = LTGS               PT Long Term Goals - 09/11/21 1715       PT LONG TERM GOAL #1    Title Pt will be independent with final HEP and walking program with rollator in order to build upon functional gains made in therapy ALL LTGS DUE 10/09/21    Baseline pt reports not performing her HEP since she was last here    Time 4    Period Weeks    Status Revised    Target Date 10/09/21      PT LONG TERM GOAL #3   Title Pt  will decr 5x sit <> stand time to 21 seconds or less with no UE support in order to demo improved BLE strength and balance.    Baseline 23.75 seconds from standard height chair on 06/09/21; 24.69 seconds with no UE support on 09/08/21    Time 4    Period Weeks    Status Revised      PT LONG TERM GOAL #4   Title Pt will improve 3MWT distance to at least 380' with no AD vs. rollator and no rest breaks in order to demo improved walking endurance and gait efficiency.    Baseline 3MWT - 360' on 06/15/21; 320' on 09/08/21 with 2 standing rest breaks    Time 4    Period Weeks    Status Revised      PT LONG TERM GOAL #6   Title Pt will improve FOTO score to at least 60% in order to demo improved functional outcomes.    Baseline 50%; 52.4% on 06/06/21    Time 4    Period Weeks    Status On-going                   Plan - 09/28/21 1026     Clinical Impression Statement Today's skilled session was focused on LE stretching to help relieve low back pain prior to performing LE strengthening/balance exercises. The pt continues to improve and could continue to benefit from further skilled PT to address remainig deficits.    Personal Factors and Comorbidities Comorbidity 3+;Past/Current Experience    Comorbidities smoking, PAD, HTN, ETOH abuse, chronic hepatitis C with hepatic coma,GAD, esophageal stricture    Examination-Activity Limitations Locomotion Level;Transfers;Stairs;Squat;Carry    Examination-Participation Restrictions Cleaning;Community Activity;Laundry    Stability/Clinical Decision Making Stable/Uncomplicated    Rehab Potential Good    PT Frequency  2x / week    PT Duration 8 weeks    PT Treatment/Interventions ADLs/Self Care Home Management;DME Instruction;Gait training;Stair training;Therapeutic activities;Functional mobility training;Therapeutic exercise;Balance training;Neuromuscular re-education;Orthotic Fit/Training;Patient/family education;Vestibular    PT Next Visit Plan perform further vestibular assessment - pt also got dizzy with VOR x1 exercise.  walking with rollator/no AD. weight shifting/SLS balance activities. NuStep/SciFit for endurance. BLE strength and stretching. standing balance on unlevel surfaces.    PT Home Exercise Plan [facility]    Consulted and Agree with Plan of Care Patient             Patient will benefit from skilled therapeutic intervention in order to improve the following deficits and impairments:  Abnormal gait, Decreased activity tolerance, Decreased balance, Decreased coordination, Decreased endurance, Decreased knowledge of use of DME, Difficulty walking, Decreased strength, Dizziness, Impaired sensation, Postural dysfunction  Visit Diagnosis: Unsteadiness on feet  Muscle weakness (generalized)  Other symptoms and signs involving the nervous system     Problem List Patient Active Problem List   Diagnosis Date Noted   Acute CVA (cerebrovascular accident) ([place]) 12/29/2020   Peripheral arterial disease ([place]) 12/20/2020   History of esophageal stricture 07/08/2020   Insomnia 12/02/2017   Chronic cough 08/14/2017   Poor appetite 08/29/2016   Peptic stricture of esophagus    History of colonic polyps    Hiatal hernia 03/16/2016   Odynophagia 03/16/2016   Dysphagia 03/16/2016   Alcohol abuse 10/26/2015   Essential hypertension 09/26/2015   PAC (premature atrial contraction) 10/11/2014   PVC (premature ventricular contraction) 10/11/2014   Back muscle spasm 06/15/2014   Generalized anxiety disorder 06/15/2014   Smoking 06/15/2014   Chronic hepatitis C without  hepatic coma ([place])  05/28/2013    Rondel Baton, SPTA 09/28/2021, 10:39 AM  [facility] [address] [place], [place], [address] Phone: [phone]   Fax:  [phone]  Name: LATRICIA CERRITO MRN: [id number] Date of Birth: 02/14/1956

## 2021-10-02 ENCOUNTER — Ambulatory Visit: Payer: Medicare HMO | Admitting: Physical Therapy

## 2021-10-04 ENCOUNTER — Ambulatory Visit: Payer: Medicare HMO | Admitting: Physical Therapy

## 2021-10-04 NOTE — Anesthesia Preprocedure Evaluation (Addendum)
Anesthesia Evaluation  Patient identified by MRN, date of birth, ID band Patient awake    Reviewed: Allergy & Precautions, NPO status , Patient's Chart, lab work & pertinent test results  History of Anesthesia Complications Negative for: history of anesthetic complications  Airway Mallampati: I  TM Distance: >3 FB Neck ROM: Full    Dental  (+) Edentulous Upper, Edentulous Lower   Pulmonary COPD,  COPD inhaler, Current Smoker and Patient abstained from smoking.,    breath sounds clear to auscultation       Cardiovascular hypertension, Pt. on medications (-) angina+ Peripheral Vascular Disease  + dysrhythmias (PACs, PVCs)  Rhythm:Regular Rate:Normal  12/2020 ECHO: EF >75%, mod LVH with normal LVF, Grade 1 DD, no significant valvular abnormalities   Neuro/Psych Anxiety Depression CVA (speech), Residual Symptoms    GI/Hepatic GERD  Medicated and Controlled,(+) Cirrhosis       ,   Endo/Other  negative endocrine ROS  Renal/GU negative Renal ROS     Musculoskeletal   Abdominal   Peds  Hematology plavix   Anesthesia Other Findings   Reproductive/Obstetrics                           Anesthesia Physical Anesthesia Plan  ASA: 3  Anesthesia Plan: General   Post-op Pain Management: Dilaudid IV and Lidocaine infusion   Induction: Intravenous  PONV Risk Score and Plan: 2 and Ondansetron and Dexamethasone  Airway Management Planned: Oral ETT  Additional Equipment: None  Intra-op Plan:   Post-operative Plan: Extubation in OR  Informed Consent: I have reviewed the patients History and Physical, chart, labs and discussed the procedure including the risks, benefits and alternatives for the proposed anesthesia with the patient or authorized representative who has indicated his/her understanding and acceptance.       Plan Discussed with: CRNA and Surgeon  Anesthesia Plan Comments: (See PAT  note 09/26/2021, Konrad Felix Ward, PA-C)      Anesthesia Quick Evaluation

## 2021-10-04 NOTE — Progress Notes (Signed)
Anesthesia Chart Review   Case: [id number] Date/Time: 10/13/21 0715   Procedure: OPEN REPAIR EPIGASTRIC HERNIA WITH MESH PATCH   Anesthesia type: General   Pre-op diagnosis: EPIGASTRIC HERNIA   Location: [facility] ROOM 01 / [facility]   Surgeons: Armandina Gemma, MD       DISCUSSION:65 y.o. every day smoker with h/o HTN, GERD, Hepatitis C (looks like prescribed Harvoni, unclear if she completed therapy), stroke 12/2020, PVD s/p iliac stent 07/13/21, emphysema, epigastric hernia scheduled for above procedure 10/13/21 with Dr. Armandina Gemma.   Per neurology note 09/11/2021, "Patient scheduled to undergo hernia surgery currently scheduled 10/13/2021. Remains on aspirin for secondary stroke prevention measures. Had stroke in 12/2020. No additional events or new stroke/TIA symptoms since that time. Okay to hold aspirin 3-5 days prior to surgery with small but acceptable risk of recurrent stroke off therapy - please ensure restart immediately after or once hemodynamically stable. She is on plavix per cardiology who will provide further clearance regarding DAPT from their standpoint."  Pt last seen by cardiology 08/09/2021. Per Dr. Gwenlyn Found pt can proceed with surgery after completing three months of DAPT, therefore after 10/12/21.   Pt advised to hold Plavix 5 days prior to procedure.    Anticipate pt can proceed with planned procedure barring acute status change.   VS: BP (!) 168/88   Pulse (!) 103   Temp 37.1 C (Oral)   Resp 18   Ht 5\' 6"  (1.676 m)   Wt 69.5 kg   SpO2 99%   BMI 24.74 kg/m   PROVIDERS: Andree Moro, DO is PCP   Quay Burow, MD is Cardiologist  LABS: Labs reviewed: Acceptable for surgery. (all labs ordered are listed, but only abnormal results are displayed)  Labs Reviewed  BASIC METABOLIC PANEL - Abnormal; Notable for the following components:      Result Value   Glucose, Bld 104 (*)    Creatinine, Ser 1.10 (*)    GFR, Estimated 55 (*)    All other components within normal limits   CBC     IMAGES:   EKG: 08/09/2021 Rate 107 bpm  Sinus tachycardia  Left atrial enlargement Nonspecific T wave abnormality  CV: Echo 12/30/2020  1. Left ventricular ejection fraction, by estimation, is >75%. The left  ventricle has hyperdynamic function. The left ventricle has no regional  wall motion abnormalities. There is moderate left ventricular hypertrophy.  Left ventricular diastolic  parameters are consistent with Grade I diastolic dysfunction (impaired  relaxation).   2. Right ventricular systolic function is normal. The right ventricular  size is normal.   3. The mitral valve is normal in structure. No evidence of mitral valve  regurgitation. No evidence of mitral stenosis.   4. The aortic valve is normal in structure. Aortic valve regurgitation is  not visualized. No aortic stenosis is present.   5. The inferior vena cava is normal in size with greater than 50%  respiratory variability, suggesting right atrial pressure of 3 mmHg.   6. Agitated saline contrast bubble study was negative, with no evidence  of any interatrial shunt.   Echo 11/08/2020  1. Hyperdynamic with peak outflow gradient of 2.2 m/s (cause of murmur).  Left ventricular ejection fraction, by estimation, is >75%. Left  ventricular ejection fraction by 3D volume is 78 %. The left ventricle has  hyperdynamic function. The left  ventricle has no regional wall motion abnormalities. Left ventricular  diastolic parameters are consistent with Grade I diastolic dysfunction  (impaired relaxation).  2. Right ventricular systolic function is normal. The right ventricular  size is normal.   3. The mitral valve is normal in structure. Trivial mitral valve  regurgitation. No evidence of mitral stenosis.   4. The aortic valve is normal in structure. Aortic valve regurgitation is  not visualized. No aortic stenosis is present.   5. The inferior vena cava is normal in size with greater than 50%  respiratory  variability, suggesting right atrial pressure of 3 mmHg.  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   Stroke ([place])    just slurred speech   Tachycardia 08/2014   Ventral hernia    Weakness 08/2014    Past Surgical History:  Procedure Laterality Date   ABDOMINAL AORTOGRAM W/LOWER EXTREMITY N/A 07/13/2021   Procedure: ABDOMINAL AORTOGRAM W/LOWER EXTREMITY;  Surgeon: Lorretta Harp, MD;  Location: [facility];  Service: Cardiovascular;  Laterality: N/A;   APPENDECTOMY     CATARACT EXTRACTION, BILATERAL     COLONOSCOPY     COLONOSCOPY WITH PROPOFOL N/A 04/30/2016   Procedure: COLONOSCOPY WITH PROPOFOL;  Surgeon: Daneil Dolin, MD;  [facility];  Service: Endoscopy;  Laterality: N/A;  1230   ESOPHAGOGASTRODUODENOSCOPY     approximately 2010   ESOPHAGOGASTRODUODENOSCOPY (EGD) WITH PROPOFOL N/A 04/30/2016   Procedure: ESOPHAGOGASTRODUODENOSCOPY (EGD) WITH PROPOFOL;  Surgeon: Daneil Dolin, MD;  [facility];  Service: Endoscopy;  Laterality: N/A;   LEFT HEART CATHETERIZATION WITH CORONARY ANGIOGRAM N/A 10/29/2014   07-14-20- pt denies this Procedure: LEFT HEART CATHETERIZATION WITH CORONARY ANGIOGRAM;  Surgeon: Burnell Blanks, MD;  [facility] C[facility];  Service: Cardiovascular;  Laterality: N/A;   MALONEY DILATION N/A 04/30/2016   Procedure: Venia Minks DILATION;  Surgeon: Daneil Dolin, MD;  [facility];  Service: Endoscopy;  Laterality: N/A;   PERIPHERAL VASCULAR  INTERVENTION  07/13/2021   Procedure: PERIPHERAL VASCULAR INTERVENTION;  Surgeon: Lorretta Harp, MD;  Location: [facility];  Service: Cardiovascular;;  left SFA left external iliac   POLYPECTOMY  04/30/2016   Procedure: POLYPECTOMY;  Surgeon: Daneil Dolin, MD;  [facility];  Service: Endoscopy;;  Sigmoid colon polyp removed via hot snare   UPPER GASTROINTESTINAL ENDOSCOPY      MEDICATIONS:  albuterol (VENTOLIN HFA) 108 (90 Base) MCG/ACT inhaler   aspirin EC 81 MG EC tablet   atorvastatin (LIPITOR) 80 MG tablet   clopidogrel (PLAVIX) 75 MG tablet   cyclobenzaprine (FLEXERIL) 10 MG tablet   diltiazem (TIAZAC) 180 MG 24 hr capsule   escitalopram (LEXAPRO) 20 MG tablet   famotidine (PEPCID) 40 MG tablet   hydrOXYzine (ATARAX/VISTARIL) 50 MG tablet   mirtazapine (REMERON) 30 MG tablet   Multiple Vitamin (MULTIVITAMIN WITH MINERALS) TABS tablet   nicotine (NICODERM CQ - DOSED IN MG/24 HOURS) 21 mg/24hr patch   pantoprazole (PROTONIX) 40 MG tablet   thiamine 100 MG tablet  sodium chloride flush (NS) 0.9 % injection 3 mL     [place] Ward, PA-C [facility]-Surgical Testing [phone]

## 2021-10-06 ENCOUNTER — Encounter (HOSPITAL_COMMUNITY): Payer: Self-pay | Admitting: Surgery

## 2021-10-06 DIAGNOSIS — K436 Other and unspecified ventral hernia with obstruction, without gangrene: Secondary | ICD-10-CM | POA: Diagnosis present

## 2021-10-06 NOTE — H&P (Signed)
PROVIDER: Abbeygail Igoe Charlotta Newton, MD  Chief Complaint: Follow-up (Epigastric hernia)   History of Present Illness:  Patient returns for follow-up and to discuss proceeding with surgical intervention for repair of symptomatic epigastric hernia. Patient had undergone an iliac stent placement by Dr. Quay Burow. Initially the recommendation was for her to remain on dual antiplatelet therapy for 1 year. This would take her to July 13, 2022 before she could come off of therapy and allow for surgical repair. However, the patient was seen by Dr. Gwenlyn Found on August 09, 2021. In his dictated note he stated that the patient needed to be on 3 months of continuous antiplatelet therapy and then could undergo her surgical procedure. This would allow for [place] to do her procedure in December. Patient continues to complain of pain. She would like to proceed with her surgery as soon as possible.   Review of Systems: A complete review of systems was obtained from the patient. I have reviewed this information and discussed as appropriate with the patient. See HPI as well for other ROS.  Review of Systems  Constitutional: Negative.  HENT: Negative.  Eyes: Negative.  Respiratory: Negative.  Cardiovascular: Negative.  Gastrointestinal: Positive for abdominal pain.  Genitourinary: Negative.  Musculoskeletal: Negative.  Skin: Negative.  Neurological: Negative.  Endo/Heme/Allergies: Negative.  Psychiatric/Behavioral: Negative.    Medical History: Past Medical History:  Diagnosis Date   Anxiety   GERD (gastroesophageal reflux disease)   History of stroke   Sleep apnea   Patient Active Problem List  Diagnosis   Alcohol abuse   Back muscle spasm   Chronic cough   Chronic hepatitis C without hepatic coma (CMS-HCC)   Dysphagia   Essential hypertension   Generalized anxiety disorder   Hiatal hernia   History of colonic polyps   History of esophageal stricture   Insomnia   Acute CVA  (cerebrovascular accident) (CMS-HCC)   PAC (premature atrial contraction)   Odynophagia, unspecified   Peptic stricture of esophagus   Peripheral arterial disease (CMS-HCC)   Poor appetite   PVC (premature ventricular contraction)   Smoking   Epigastric hernia   Past Surgical History:  Procedure Laterality Date   Blockage 07/2021  Leg    No Known Allergies  No current outpatient medications on file prior to visit.   No current facility-administered medications on file prior to visit.   History reviewed. No pertinent family history.   Social History   Tobacco Use  Smoking Status Current Every Day Smoker  Smokeless Tobacco Never Used    Social History   Socioeconomic History   Marital status: Divorced  Tobacco Use   Smoking status: Current Every Day Smoker   Smokeless tobacco: Never Used  Scientific laboratory technician Use: Never used  Substance and Sexual Activity   Alcohol use: Yes   Drug use: Never   Objective:   There were no vitals filed for this visit.  There is no height or weight on file to calculate BMI.  Physical Exam   Limited examination  Abdomen is soft without distention. There is a visible bulge in the epigastrium. This measures approximately 6 to 7 cm in greatest diameter. It is relatively soft. It is mildly tender to palpation. It is not reducible.  Assessment and Plan:   Epigastric hernia   Patient is symptomatic. She would like to proceed with repair of her epigastric hernia as soon as possible. It appears from the reports from cardiology that she will be able  to discontinue her antiplatelet therapy temporarily in December 2022. We will plan to stop her Plavix and her aspirin for 5 days prior to her surgical procedure. She may then resume treatment the day following surgery.  We plan to perform an open repair of epigastric hernia with mesh patch as an outpatient surgical procedure. She and I have discussed the size and location of the surgical  incision. We discussed restrictions on her activities after surgery. We discussed resuming her antiplatelet therapy. We discussed the risk of bleeding. We have discussed the risk of recurrence. She understands and wishes to proceed.  The risks and benefits of the procedure have been discussed at length with the patient. The patient understands the proposed procedure, potential alternative treatments, and the course of recovery to be expected. All of the patient's questions have been answered at this time. The patient wishes to proceed with surgery.   Armandina Gemma, MD [facility] Surgery A [place] practice Office: [phone]

## 2021-10-10 ENCOUNTER — Other Ambulatory Visit: Payer: Self-pay

## 2021-10-10 ENCOUNTER — Encounter: Payer: Self-pay | Admitting: Physical Therapy

## 2021-10-10 ENCOUNTER — Ambulatory Visit: Payer: Medicare HMO | Admitting: Physical Therapy

## 2021-10-10 DIAGNOSIS — I69851 Hemiplegia and hemiparesis following other cerebrovascular disease affecting right dominant side: Secondary | ICD-10-CM

## 2021-10-10 DIAGNOSIS — R2681 Unsteadiness on feet: Secondary | ICD-10-CM | POA: Diagnosis not present

## 2021-10-10 DIAGNOSIS — M6281 Muscle weakness (generalized): Secondary | ICD-10-CM

## 2021-10-10 DIAGNOSIS — R29818 Other symptoms and signs involving the nervous system: Secondary | ICD-10-CM

## 2021-10-10 NOTE — Therapy (Addendum)
[facility] [address] [place], [place], [address] Phone: [phone]   Fax:  [phone]  Physical Therapy Treatment  Patient Details  Name: Kara Bailey MRN: [id number] Date of Birth: [birth year] Referring Provider (PT): Cipriano Mile, NP   Encounter Date: 10/10/2021   PT End of Session - 10/10/21 1021     Visit Number 26    Number of Visits 29    Date for PT Re-Evaluation 01/04/35   per recert on 11/04/48   Authorization Type [employer]    Authorization Time Period 16 visits from 67/22- 07/17/21, 12 visits through 06/09/21 - 08/08/21, 8 visits 09/08/21-11/07/21    Authorization - Visit Number 6    Authorization - Number of Visits 8    Progress Note Due on Visit 30    PT Start Time 1017    PT Stop Time 1057    PT Time Calculation (min) 40 min    Equipment Utilized During Treatment Gait belt    Activity Tolerance Patient tolerated treatment well;Patient limited by fatigue    Behavior During Therapy [facility] for tasks assessed/performed             Past Medical History:  Diagnosis Date   Anxiety    Chest pain 08/2014   unspecified   Cirrhosis ([place])    Depression    Dizziness 08/2014   Dyspnea 08/2014   Dysrhythmia    Emphysema lung ([place])    GERD (gastroesophageal reflux disease)    Heart murmur    Hepatitis C    Hiatal hernia    Hypertension    PAC (premature atrial contraction) 10/11/2014   PAD (peripheral artery disease) (HCC)    Palpitations    PVC (premature ventricular contraction) 10/11/2014   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   Stroke ([place])    just slurred speech   Tachycardia 08/2014   Ventral hernia    Weakness 08/2014    Past Surgical History:  Procedure Laterality Date   ABDOMINAL AORTOGRAM W/LOWER EXTREMITY N/A 07/13/2021   Procedure: ABDOMINAL AORTOGRAM W/LOWER EXTREMITY;  Surgeon: Lorretta Harp, MD;  Location: [facility];  Service: Cardiovascular;   Laterality: N/A;   APPENDECTOMY     CATARACT EXTRACTION, BILATERAL     COLONOSCOPY     COLONOSCOPY WITH PROPOFOL N/A 04/30/2016   Procedure: COLONOSCOPY WITH PROPOFOL;  Surgeon: Daneil Dolin, MD;  [facility];  Service: Endoscopy;  Laterality: N/A;  1230   ESOPHAGOGASTRODUODENOSCOPY     approximately 2010   ESOPHAGOGASTRODUODENOSCOPY (EGD) WITH PROPOFOL N/A 04/30/2016   Procedure: ESOPHAGOGASTRODUODENOSCOPY (EGD) WITH PROPOFOL;  Surgeon: Daneil Dolin, MD;  [facility];  Service: Endoscopy;  Laterality: N/A;   LEFT HEART CATHETERIZATION WITH CORONARY ANGIOGRAM N/A 10/29/2014   07-14-20- pt denies this Procedure: LEFT HEART CATHETERIZATION WITH CORONARY ANGIOGRAM;  Surgeon: Burnell Blanks, MD;  Location: [facility] CATH LAB;  Service: Cardiovascular;  Laterality: N/A;   MALONEY DILATION N/A 04/30/2016   Procedure: Venia Minks DILATION;  Surgeon: Daneil Dolin, MD;  [facility];  Service: Endoscopy;  Laterality: N/A;   PERIPHERAL VASCULAR INTERVENTION  07/13/2021   Procedure: PERIPHERAL VASCULAR INTERVENTION;  Surgeon: Lorretta Harp, MD;  Location: [facility];  Service: Cardiovascular;;  left SFA left external iliac   POLYPECTOMY  04/30/2016   Procedure: POLYPECTOMY;  Surgeon: Daneil Dolin, MD;  [facility];  Service: Endoscopy;;  Sigmoid colon polyp removed via hot snare  UPPER GASTROINTESTINAL ENDOSCOPY      There were no vitals filed for this visit.   Subjective Assessment - 10/10/21 1019     Subjective No changes, no falls. Back is feeling a lot better. Has been doing her exercises and reports they are going well. Has hernia surgery later this week.    Pertinent History smoking, PAD, HTN, ETOH abuse, chronic hepatitis C with hepatic coma,GAD, esophageal stricture    Limitations Walking;Standing    Patient Stated Goals wants to work on walking normally    Currently in Pain? No/denies    Pain Onset More than a month ago                 [facility] PT Assessment - 10/10/21 1026       Observation/Other Assessments   Focus on Therapeutic Outcomes (FOTO)  50.9%                           [facility] Adult PT Treatment/Exercise - 10/10/21 1026       Transfers   Transfers Sit to Stand;Stand to Sit    Sit to Stand 5: Supervision;With upper extremity assist;Without upper extremity assist    Stand to Sit 5: Supervision;With upper extremity assist;Without upper extremity assist    Comments Performed x4 reps from mat table without UE support, cues for forward lean and equal weight bearing, during last 2 reps pt takes incr time to come to stand.      Ambulation/Gait   Ambulation/Gait Yes    Ambulation/Gait Assistance 5: Supervision    Ambulation/Gait Assistance Details Pt fatigues after 2 laps. Cues for heel stride and foot clearance and relaxing BUE during gait.    Ambulation Distance (Feet) 230 Feet    Assistive device None    Gait Pattern Step-through pattern;Decreased arm swing - left;Decreased arm swing - right;Decreased stance time - left;Lateral hip instability;Lateral trunk lean to right;Decreased trunk rotation    Ambulation Surface Level;Indoor      Knee/Hip Exercises: Aerobic   Stepper With BLE/BUE at gear 1.3 for 7 minutes for strength, endurance.      Knee/Hip Exercises: Standing   Lateral Step Up Both;1 set;10 reps;Hand Hold: 2;Step Height: 4"    Lateral Step Up Limitations Cues for technique                 Balance Exercises - 10/10/21 1042       Balance Exercises: Standing   SLS Eyes open;Foam/compliant surface    SLS Limitations Alternating SLS taps on air ex to 6" step x 10 reps each leg, no UE support    Other Standing Exercises On air ex: step up/up and down/down 2 x 5 reps each leg, without UE support                PT Education - 10/10/21 1059     Education Details Plan for D/C at next session. Reviewed walking program with rollator and gradually incr the time  for improving endurance.    Person(s) Educated Patient    Methods Explanation    Comprehension Verbalized understanding              PT Short Term Goals - 09/11/21 1714       PT SHORT TERM GOAL #1   Title ALL STGS = LTGS               PT Long Term Goals - 10/10/21 1052  PT LONG TERM GOAL #1   Title Pt will be independent with final HEP and walking program with rollator in order to build upon functional gains made in therapy ALL LTGS DUE 10/09/21    Baseline pt reports not performing her HEP since she was last here    Time 4    Period Weeks    Status Revised      PT LONG TERM GOAL #3   Title Pt will decr 5x sit <> stand time to 21 seconds or less with no UE support in order to demo improved BLE strength and balance.    Baseline 23.75 seconds from standard height chair on 06/09/21; 24.69 seconds with no UE support on 09/08/21    Time 4    Period Weeks    Status Revised      PT LONG TERM GOAL #4   Title Pt will improve 3MWT distance to at least 380' with no AD vs. rollator and no rest breaks in order to demo improved walking endurance and gait efficiency.    Baseline 3MWT - 360' on 06/15/21; 320' on 09/08/21 with 2 standing rest breaks    Time 4    Period Weeks    Status Revised      PT LONG TERM GOAL #6   Title Pt will improve FOTO score to at least 60% in order to demo improved functional outcomes.    Baseline 50%; 52.4% on 06/06/21; 50.9% on 10/10/21    Time 4    Period Weeks    Status Not Met                   Plan - 10/10/21 1349     Clinical Impression Statement Today's skilled session focused on endurance with gait/SciFit, BLE strengthening,and standing balance strategies. Pt fatigued after 2 laps of gait with no AD and needed a seated rest break. However, pt is demonstrating better heel strike with gait. Check LTG in regards to FOTO, pt did not meet goal. Scored a 50.9% today - at eval was 50%. Will plan to check remainder of goals at next  session and D/C as pt gets hernia surgery later this week.    Personal Factors and Comorbidities Comorbidity 3+;Past/Current Experience    Comorbidities smoking, PAD, HTN, ETOH abuse, chronic hepatitis C with hepatic coma,GAD, esophageal stricture    Examination-Activity Limitations Locomotion Level;Transfers;Stairs;Squat;Carry    Examination-Participation Restrictions Cleaning;Community Activity;Laundry    Stability/Clinical Decision Making Stable/Uncomplicated    Rehab Potential Good    PT Frequency 2x / week    PT Duration 8 weeks    PT Treatment/Interventions ADLs/Self Care Home Management;DME Instruction;Gait training;Stair training;Therapeutic activities;Functional mobility training;Therapeutic exercise;Balance training;Neuromuscular re-education;Orthotic Fit/Training;Patient/family education;Vestibular    PT Next Visit Plan check LTGs/ HEP and D/C.    PT Home Exercise Plan [email or web address]    Consulted and Agree with Plan of Care Patient             Patient will benefit from skilled therapeutic intervention in order to improve the following deficits and impairments:  Abnormal gait, Decreased activity tolerance, Decreased balance, Decreased coordination, Decreased endurance, Decreased knowledge of use of DME, Difficulty walking, Decreased strength, Dizziness, Impaired sensation, Postural dysfunction  Visit Diagnosis: Unsteadiness on feet  Other symptoms and signs involving the nervous system  Muscle weakness (generalized)  Hemiplegia and hemiparesis following other cerebrovascular disease affecting right dominant side [facility])     Problem List Patient Active Problem List   Diagnosis Date Noted   Incarcerated  epigastric hernia 10/06/2021   Acute CVA (cerebrovascular accident) ([place]) 12/29/2020   Peripheral arterial disease ([place]) 12/20/2020   History of esophageal stricture 07/08/2020   Insomnia 12/02/2017   Chronic cough 08/14/2017   Poor appetite 08/29/2016   Peptic stricture of  esophagus    History of colonic polyps    Hiatal hernia 03/16/2016   Odynophagia 03/16/2016   Dysphagia 03/16/2016   Alcohol abuse 10/26/2015   Essential hypertension 09/26/2015   PAC (premature atrial contraction) 10/11/2014   PVC (premature ventricular contraction) 10/11/2014   Back muscle spasm 06/15/2014   Generalized anxiety disorder 06/15/2014   Smoking 06/15/2014   Chronic hepatitis C without hepatic coma ([place]) 05/28/2013    Arliss Journey, PT, DPT  10/10/2021, 1:52 PM  [facility] [address] [place], [place], [address] Phone: [phone]   Fax:  [phone]  Name: KYLI SORTER MRN: [id number] Date of Birth: 1956/03/12

## 2021-10-12 ENCOUNTER — Ambulatory Visit: Payer: Medicare HMO | Attending: General Practice | Admitting: Physical Therapy

## 2021-10-12 ENCOUNTER — Other Ambulatory Visit: Payer: Self-pay

## 2021-10-12 ENCOUNTER — Encounter: Payer: Self-pay | Admitting: Physical Therapy

## 2021-10-12 DIAGNOSIS — M6281 Muscle weakness (generalized): Secondary | ICD-10-CM | POA: Diagnosis present

## 2021-10-12 DIAGNOSIS — R2681 Unsteadiness on feet: Secondary | ICD-10-CM | POA: Insufficient documentation

## 2021-10-12 DIAGNOSIS — R29818 Other symptoms and signs involving the nervous system: Secondary | ICD-10-CM | POA: Diagnosis present

## 2021-10-12 NOTE — Therapy (Signed)
[facility] [address] [place], [place], [address] Phone: [phone]   Fax:  [phone]  Physical Therapy Treatment/Discharge Summary  Patient Details  Name: Kara Bailey MRN: [id number] Date of Birth: 30-Nov-1955 Referring Provider (PT): Cipriano Mile, NP   Encounter Date: 10/12/2021   PT End of Session - 10/12/21 1309     Visit Number 27    Number of Visits 29    Date for PT Re-Evaluation 16/07/37   per recert on 10/62/69   Authorization Type [employer]    Authorization Time Period 16 visits from 67/22- 07/17/21, 12 visits through 06/09/21 - 08/08/21, 8 visits 09/08/21-11/07/21    Authorization - Visit Number 7    Authorization - Number of Visits 8    Progress Note Due on Visit 30    PT Start Time 1017    PT Stop Time 1058    PT Time Calculation (min) 41 min    Equipment Utilized During Treatment Gait belt    Activity Tolerance Patient tolerated treatment well;Patient limited by fatigue    Behavior During Therapy [facility] for tasks assessed/performed             Past Medical History:  Diagnosis Date   Anxiety    Chest pain 08/2014   unspecified   Cirrhosis ([place])    Depression    Dizziness 08/2014   Dyspnea 08/2014   Dysrhythmia    Emphysema lung ([place])    GERD (gastroesophageal reflux disease)    Heart murmur    Hepatitis C    Hiatal hernia    Hypertension    PAC (premature atrial contraction) 10/11/2014   PAD (peripheral artery disease) (HCC)    Palpitations    PVC (premature ventricular contraction) 10/11/2014   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   Stroke ([place])    just slurred speech   Tachycardia 08/2014   Ventral hernia    Weakness 08/2014    Past Surgical History:  Procedure Laterality Date   ABDOMINAL AORTOGRAM W/LOWER EXTREMITY N/A 07/13/2021   Procedure: ABDOMINAL AORTOGRAM W/LOWER EXTREMITY;  Surgeon: Lorretta Harp, MD;  Location: [place] [facility];  Service:  Cardiovascular;  Laterality: N/A;   APPENDECTOMY     CATARACT EXTRACTION, BILATERAL     COLONOSCOPY     COLONOSCOPY WITH PROPOFOL N/A 04/30/2016   Procedure: COLONOSCOPY WITH PROPOFOL;  Surgeon: Daneil Dolin, MD;  [facility];  Service: Endoscopy;  Laterality: N/A;  1230   ESOPHAGOGASTRODUODENOSCOPY     approximately 2010   ESOPHAGOGASTRODUODENOSCOPY (EGD) WITH PROPOFOL N/A 04/30/2016   Procedure: ESOPHAGOGASTRODUODENOSCOPY (EGD) WITH PROPOFOL;  Surgeon: Daneil Dolin, MD;  [facility];  Service: Endoscopy;  Laterality: N/A;   LEFT HEART CATHETERIZATION WITH CORONARY ANGIOGRAM N/A 10/29/2014   07-14-20- pt denies this Procedure: LEFT HEART CATHETERIZATION WITH CORONARY ANGIOGRAM;  Surgeon: Burnell Blanks, MD;  Location: [facility];  Service: Cardiovascular;  Laterality: N/A;   MALONEY DILATION N/A 04/30/2016   Procedure: Venia Minks DILATION;  Surgeon: Daneil Dolin, MD;  [facility];  Service: Endoscopy;  Laterality: N/A;   PERIPHERAL VASCULAR INTERVENTION  07/13/2021   Procedure: PERIPHERAL VASCULAR INTERVENTION;  Surgeon: Lorretta Harp, MD;  Location: [facility];  Service: Cardiovascular;;  left SFA left external iliac   POLYPECTOMY  04/30/2016   Procedure: POLYPECTOMY;  Surgeon: Daneil Dolin, MD;  [facility];  Service: Endoscopy;;  Sigmoid colon polyp removed via hot snare  UPPER GASTROINTESTINAL ENDOSCOPY      There were no vitals filed for this visit.   Subjective Assessment - 10/12/21 1019     Subjective No changes, falls, or pain today. "Ready to graduate." Pt has hernia surgery tomorrow.    Pertinent History smoking, PAD, HTN, ETOH abuse, chronic hepatitis C with hepatic coma,GAD, esophageal stricture    Limitations Walking;Standing    Patient Stated Goals wants to work on walking normally    Currently in Pain? No/denies    Pain Score 0-No pain                [facility] Adult PT Treatment/Exercise - 10/12/21  1020       Transfers   Transfers Sit to Stand;Stand to Sit    Sit to Stand 5: Supervision;With upper extremity assist;Without upper extremity assist    Five time sit to stand comments  18.75 with no UE from standard chair.      Ambulation/Gait   Ambulation/Gait Yes    Ambulation/Gait Assistance 5: Supervision    Ambulation Distance (Feet) 355 Feet   ambulated 355 ft during 3MWT around track   Assistive device None    Gait Pattern Step-through pattern;Decreased arm swing - left;Decreased arm swing - right;Decreased stance time - left;Lateral hip instability;Lateral trunk lean to right;Decreased trunk rotation    Ambulation Surface Level;Indoor      Knee/Hip Exercises: Stretches   Piriformis Stretch Right;2 reps;30 seconds    Piriformis Stretch Limitations seated piriformis stretch with anterior trunk lean 2 x 30 sec each side, performed at end of session with pt reporting relief in RLE pain after performing.    Gastroc Stretch Right;1 rep;30 seconds    Gastroc Stretch Limitations seated              Balance Exercises - 10/12/21 1054       Balance Exercises: Standing   Sidestepping Foam/compliant support;2 reps;Limitations    Sidestepping Limitations 3x each direction at counter to no UE with min guard. The pt demonstrated decreased step height and required cues to keep feet pointed forward.    Step Over Hurdles / Cones Stepping over hurdles on blue mat forwards then lateral with 1-2 UE support. Pt performed x 2 each down and back for lateral and forwards stepping. The pt demonstrated hesitation when stepping over hurdles in all directions and decreased step length when performing lateral stepping and required cues to correct.    Other Standing Exercises on solid surface: alternating tapping LE's on first step with no UE support x 10 each side. Transitioned to standing on airex: Pt performed alternating tapping first step x 10 each side with no UE support. The pt then performed  alternating tapping up/down first and second step with no UE support x 10 each side. The pt performed all with min guard.    Other Standing Exercises Comments After performing lateral.forwards stepping at counter, pt c/o pain in R LE, and pt transitioned to mat table to perform stretches to attempt to relieve pain.            PHYSICAL THERAPY DISCHARGE SUMMARY  Visits from Start of Care: 27  Current functional level related to goals / functional outcomes: See LTGs/clinical assessment statement.   Remaining deficits: Impaired balance, gait abnormalities, decr strength, decr endurance.   Education / Equipment: HEP/walking program with rollator.    Patient agrees to discharge. Patient goals were partially met. Patient is being discharged due to meeting the stated rehab goals. And maximizing  rehab potential. Pt will also be getting hernia surgery on 10/13/21.      PT Short Term Goals - 09/11/21 1714       PT SHORT TERM GOAL #1   Title ALL STGS = LTGS               PT Long Term Goals - 10/12/21 1304       PT LONG TERM GOAL #1   Title Pt will be independent with final HEP and walking program with rollator in order to build upon functional gains made in therapy ALL LTGS DUE 10/09/21    Baseline pt reports HEP and regular walking with rollator have been going well.    Status Achieved      PT LONG TERM GOAL #3   Title Pt will decr 5x sit <> stand time to 21 seconds or less with no UE support in order to demo improved BLE strength and balance.    Baseline 23.75 seconds from standard height chair on 06/09/21; 24.69 seconds with no UE support on 09/08/21; 18.75 seconds with no UE support on 10/12/2021    Status Achieved      PT LONG TERM GOAL #4   Title Pt will improve 3MWT distance to at least 380' with no AD vs. rollator and no rest breaks in order to demo improved walking endurance and gait efficiency.    Baseline 3MWT - 360' on 06/15/21; 320' on 09/08/21 with 2 standing rest  breaks; 355' on 10/12/2021 with no AD and no rest breaks    Status Partially Met      PT LONG TERM GOAL #6   Title Pt will improve FOTO score to at least 60% in order to demo improved functional outcomes.    Baseline 50%; 52.4% on 06/06/21; 50.9% on 10/10/21    Time 4    Period Weeks    Status Not Met               Plan - 10/12/21 1309     Clinical Impression Statement Today's skilled session was focused on assessing LTG's for D/C. The pt has met 2/4 goals, partially met 1 goal, and not met 1 goal. The rest of the session was focused on high level balance exercises. The pt c/o pain in R LE after balance exercises, however was relieved by stretching. Discussed with pt about plan to D/C from therapy with pt in agreement with plan and is pleased with her progress. Pt to continue with HEP after clearance from hernia surgery on 10/13/21.    Personal Factors and Comorbidities Comorbidity 3+;Past/Current Experience    Comorbidities smoking, PAD, HTN, ETOH abuse, chronic hepatitis C with hepatic coma,GAD, esophageal stricture    Examination-Activity Limitations Locomotion Level;Transfers;Stairs;Squat;Carry    Examination-Participation Restrictions Cleaning;Community Activity;Laundry    Stability/Clinical Decision Making Stable/Uncomplicated    Rehab Potential Good    PT Frequency 2x / week    PT Duration 8 weeks    PT Treatment/Interventions ADLs/Self Care Home Management;DME Instruction;Gait training;Stair training;Therapeutic activities;Functional mobility training;Therapeutic exercise;Balance training;Neuromuscular re-education;Orthotic Fit/Training;Patient/family education;Vestibular    PT Next Visit Plan Send note to PT for D/C    PT Home Exercise Plan [facility]    Consulted and Agree with Plan of Care Patient             Patient will benefit from skilled therapeutic intervention in order to improve the following deficits and impairments:  Abnormal gait, Decreased activity tolerance,  Decreased balance, Decreased coordination, Decreased endurance, Decreased knowledge  of use of DME, Difficulty walking, Decreased strength, Dizziness, Impaired sensation, Postural dysfunction  Visit Diagnosis: Unsteadiness on feet  Other symptoms and signs involving the nervous system  Muscle weakness (generalized)     Problem List Patient Active Problem List   Diagnosis Date Noted   Incarcerated epigastric hernia 10/06/2021   Acute CVA (cerebrovascular accident) ([place]) 12/29/2020   Peripheral arterial disease ([place]) 12/20/2020   History of esophageal stricture 07/08/2020   Insomnia 12/02/2017   Chronic cough 08/14/2017   Poor appetite 08/29/2016   Peptic stricture of esophagus    History of colonic polyps    Hiatal hernia 03/16/2016   Odynophagia 03/16/2016   Dysphagia 03/16/2016   Alcohol abuse 10/26/2015   Essential hypertension 09/26/2015   PAC (premature atrial contraction) 10/11/2014   PVC (premature ventricular contraction) 10/11/2014   Back muscle spasm 06/15/2014   Generalized anxiety disorder 06/15/2014   Smoking 06/15/2014   Chronic hepatitis C without hepatic coma ([place]) 05/28/2013    Rondel Baton, SPTA 10/12/2021, 1:24 PM  [facility] [address] [place], [place], [address] Phone: [phone]   Fax:  [phone]  Name: SUKAINA TOOTHAKER MRN: [id number] Date of Birth: December 11, 1955  Janann August, PT, DPT 10/13/21 8:16 AM

## 2021-10-13 ENCOUNTER — Ambulatory Visit (HOSPITAL_COMMUNITY): Payer: Medicare HMO | Admitting: Physician Assistant

## 2021-10-13 ENCOUNTER — Encounter (HOSPITAL_COMMUNITY)
Admission: RE | Disposition: A | Discharge status: Home / Self Care | Payer: Self-pay | Source: Home / Self Care | Attending: Surgery

## 2021-10-13 ENCOUNTER — Encounter (HOSPITAL_COMMUNITY): Payer: Self-pay | Admitting: Surgery

## 2021-10-13 ENCOUNTER — Ambulatory Visit (HOSPITAL_COMMUNITY): Payer: Medicare HMO | Admitting: Anesthesiology

## 2021-10-13 ENCOUNTER — Ambulatory Visit (HOSPITAL_COMMUNITY)
Admission: RE | Admit: 2021-10-13 | Discharge: 2021-10-13 | Disposition: A | Discharge status: Home / Self Care | Payer: Medicare HMO | Attending: Surgery | Admitting: Surgery

## 2021-10-13 DIAGNOSIS — I739 Peripheral vascular disease, unspecified: Secondary | ICD-10-CM | POA: Insufficient documentation

## 2021-10-13 DIAGNOSIS — K219 Gastro-esophageal reflux disease without esophagitis: Secondary | ICD-10-CM | POA: Insufficient documentation

## 2021-10-13 DIAGNOSIS — I10 Essential (primary) hypertension: Secondary | ICD-10-CM | POA: Diagnosis not present

## 2021-10-13 DIAGNOSIS — F17200 Nicotine dependence, unspecified, uncomplicated: Secondary | ICD-10-CM | POA: Insufficient documentation

## 2021-10-13 DIAGNOSIS — J449 Chronic obstructive pulmonary disease, unspecified: Secondary | ICD-10-CM | POA: Diagnosis not present

## 2021-10-13 DIAGNOSIS — K439 Ventral hernia without obstruction or gangrene: Secondary | ICD-10-CM | POA: Insufficient documentation

## 2021-10-13 DIAGNOSIS — K436 Other and unspecified ventral hernia with obstruction, without gangrene: Secondary | ICD-10-CM | POA: Diagnosis present

## 2021-10-13 HISTORY — PX: EPIGASTRIC HERNIA REPAIR: SHX404

## 2021-10-13 SURGERY — REPAIR, HERNIA, EPIGASTRIC, ADULT
Anesthesia: General | Site: Abdomen

## 2021-10-13 MED ORDER — LACTATED RINGERS IV SOLN: INTRAVENOUS | Status: DC

## 2021-10-13 MED ORDER — MIDAZOLAM HCL 2 MG/2ML IJ SOLN
INTRAMUSCULAR | Status: DC | PRN
  Administered 2021-10-13: 1 mg via INTRAVENOUS

## 2021-10-13 MED ORDER — BUPIVACAINE LIPOSOME 1.3 % IJ SUSP
INTRAMUSCULAR | Status: AC
  Filled 2021-10-13: qty 20

## 2021-10-13 MED ORDER — LIDOCAINE 2% (20 MG/ML) 5 ML SYRINGE
INTRAMUSCULAR | Status: DC | PRN
  Administered 2021-10-13: 20 mg via INTRAVENOUS

## 2021-10-13 MED ORDER — MEPERIDINE HCL 50 MG/ML IJ SOLN: 6.2500 mg | INTRAMUSCULAR | Status: DC | PRN

## 2021-10-13 MED ORDER — LIDOCAINE HCL (PF) 2 % IJ SOLN
INTRAMUSCULAR | Status: DC | PRN
  Administered 2021-10-13: 1.5 mg/kg/h via INTRADERMAL

## 2021-10-13 MED ORDER — HYDROMORPHONE HCL 1 MG/ML IJ SOLN
0.2500 mg | INTRAMUSCULAR | Status: DC | PRN
Start: 2021-10-13 — End: 2021-10-13

## 2021-10-13 MED ORDER — MIDAZOLAM HCL 2 MG/2ML IJ SOLN
INTRAMUSCULAR | Status: AC
  Filled 2021-10-13: qty 2

## 2021-10-13 MED ORDER — CEFAZOLIN SODIUM-DEXTROSE 2-4 GM/100ML-% IV SOLN
2.0000 g | INTRAVENOUS | Status: AC
  Administered 2021-10-13: 2 g via INTRAVENOUS
  Filled 2021-10-13: qty 100

## 2021-10-13 MED ORDER — SUGAMMADEX SODIUM 200 MG/2ML IV SOLN
INTRAVENOUS | Status: DC | PRN
  Administered 2021-10-13: 150 mg via INTRAVENOUS

## 2021-10-13 MED ORDER — FENTANYL CITRATE (PF) 100 MCG/2ML IJ SOLN
INTRAMUSCULAR | Status: AC
  Filled 2021-10-13: qty 2

## 2021-10-13 MED ORDER — LIDOCAINE HCL 2 % IJ SOLN
INTRAMUSCULAR | Status: AC
  Filled 2021-10-13: qty 20

## 2021-10-13 MED ORDER — MIDAZOLAM HCL 2 MG/2ML IJ SOLN: 0.5000 mg | Freq: Once | INTRAMUSCULAR | Status: DC | PRN

## 2021-10-13 MED ORDER — BUPIVACAINE-EPINEPHRINE 0.25% -1:200000 IJ SOLN
INTRAMUSCULAR | Status: AC
  Filled 2021-10-13: qty 1

## 2021-10-13 MED ORDER — PROPOFOL 10 MG/ML IV BOLUS
INTRAVENOUS | Status: DC | PRN
  Administered 2021-10-13: 120 mg via INTRAVENOUS

## 2021-10-13 MED ORDER — ONDANSETRON HCL 4 MG/2ML IJ SOLN
INTRAMUSCULAR | Status: AC
  Filled 2021-10-13: qty 2

## 2021-10-13 MED ORDER — LIDOCAINE HCL (PF) 2 % IJ SOLN
INTRAMUSCULAR | Status: AC
  Filled 2021-10-13: qty 5

## 2021-10-13 MED ORDER — BUPIVACAINE-EPINEPHRINE (PF) 0.25% -1:200000 IJ SOLN
INTRAMUSCULAR | Status: DC | PRN
  Administered 2021-10-13: 40 mL via PERINEURAL

## 2021-10-13 MED ORDER — ONDANSETRON HCL 4 MG/2ML IJ SOLN
INTRAMUSCULAR | Status: DC | PRN
  Administered 2021-10-13: 4 mg via INTRAVENOUS

## 2021-10-13 MED ORDER — DEXAMETHASONE SODIUM PHOSPHATE 10 MG/ML IJ SOLN
INTRAMUSCULAR | Status: AC
  Filled 2021-10-13: qty 1

## 2021-10-13 MED ORDER — DEXAMETHASONE SODIUM PHOSPHATE 10 MG/ML IJ SOLN
INTRAMUSCULAR | Status: DC | PRN
  Administered 2021-10-13: 4 mg via INTRAVENOUS

## 2021-10-13 MED ORDER — CHLORHEXIDINE GLUCONATE CLOTH 2 % EX PADS: 6.0000 | MEDICATED_PAD | Freq: Once | CUTANEOUS | Status: DC

## 2021-10-13 MED ORDER — ROCURONIUM BROMIDE 10 MG/ML (PF) SYRINGE
PREFILLED_SYRINGE | INTRAVENOUS | Status: AC
  Filled 2021-10-13: qty 10

## 2021-10-13 MED ORDER — OXYCODONE HCL 5 MG PO TABS: 5.0000 mg | ORAL_TABLET | Freq: Once | ORAL | Status: DC | PRN

## 2021-10-13 MED ORDER — BUPIVACAINE LIPOSOME 1.3 % IJ SUSP: 20.0000 mL | Freq: Once | INTRAMUSCULAR | Status: DC

## 2021-10-13 MED ORDER — ROCURONIUM BROMIDE 10 MG/ML (PF) SYRINGE
PREFILLED_SYRINGE | INTRAVENOUS | Status: DC | PRN
  Administered 2021-10-13: 60 mg via INTRAVENOUS

## 2021-10-13 MED ORDER — ORAL CARE MOUTH RINSE: 15.0000 mL | Freq: Once | OROMUCOSAL | Status: AC

## 2021-10-13 MED ORDER — PROMETHAZINE HCL 25 MG/ML IJ SOLN
6.2500 mg | INTRAMUSCULAR | Status: DC | PRN
Start: 2021-10-13 — End: 2021-10-13

## 2021-10-13 MED ORDER — 0.9 % SODIUM CHLORIDE (POUR BTL) OPTIME
TOPICAL | Status: DC | PRN
  Administered 2021-10-13: 2000 mL

## 2021-10-13 MED ORDER — TRAMADOL HCL 50 MG PO TABS: 50.0000 mg | ORAL_TABLET | Freq: Four times a day (QID) | ORAL | 0 refills | Status: DC | PRN

## 2021-10-13 MED ORDER — FENTANYL CITRATE (PF) 100 MCG/2ML IJ SOLN
INTRAMUSCULAR | Status: DC | PRN
  Administered 2021-10-13: 100 ug via INTRAVENOUS
  Administered 2021-10-13: 50 ug via INTRAVENOUS

## 2021-10-13 MED ORDER — CHLORHEXIDINE GLUCONATE 0.12 % MT SOLN
15.0000 mL | Freq: Once | OROMUCOSAL | Status: AC
  Administered 2021-10-13: 15 mL via OROMUCOSAL

## 2021-10-13 MED ORDER — OXYCODONE HCL 5 MG/5ML PO SOLN: 5.0000 mg | Freq: Once | ORAL | Status: DC | PRN

## 2021-10-13 MED ORDER — PROPOFOL 10 MG/ML IV BOLUS
INTRAVENOUS | Status: AC
  Filled 2021-10-13: qty 20

## 2021-10-13 SURGICAL SUPPLY — 37 items
ADH SKN CLS APL DERMABOND .7 (GAUZE/BANDAGES/DRESSINGS) ×1
APL PRP STRL LF DISP 70% ISPRP (MISCELLANEOUS) ×2
BAG COUNTER SPONGE SURGICOUNT (BAG) IMPLANT
BAG SPNG CNTER NS LX DISP (BAG)
BINDER ABDOMINAL 12 ML 46-62 (SOFTGOODS) ×1 IMPLANT
CHLORAPREP W/TINT 26 (MISCELLANEOUS) ×4 IMPLANT
COVER SURGICAL LIGHT HANDLE (MISCELLANEOUS) ×2 IMPLANT
DERMABOND ADVANCED (GAUZE/BANDAGES/DRESSINGS) ×1
DERMABOND ADVANCED .7 DNX12 (GAUZE/BANDAGES/DRESSINGS) IMPLANT
DRAPE LAPAROSCOPIC ABDOMINAL (DRAPES) ×2 IMPLANT
DRAPE WARM FLUID 44X44 (DRAPES) ×1 IMPLANT
ELECT REM PT RETURN 15FT ADLT (MISCELLANEOUS) ×2 IMPLANT
GAUZE SPONGE 4X4 12PLY STRL (GAUZE/BANDAGES/DRESSINGS) ×1 IMPLANT
GLOVE SURG ENC MOIS LTX SZ6.5 (GLOVE) ×1 IMPLANT
GLOVE SURG ORTHO LTX SZ8 (GLOVE) ×2 IMPLANT
GLOVE SURG POLY ORTHO LF SZ7.5 (GLOVE) ×2 IMPLANT
GLOVE SURG SYN 7.5  E (GLOVE) ×2
GLOVE SURG SYN 7.5 E (GLOVE) ×1 IMPLANT
GLOVE SURG SYN 7.5 PF PI (GLOVE) ×1 IMPLANT
GLOVE SURG UNDER POLY LF SZ6.5 (GLOVE) ×1 IMPLANT
GOWN STRL REUS W/TWL LRG LVL3 (GOWN DISPOSABLE) ×1 IMPLANT
GOWN STRL REUS W/TWL XL LVL3 (GOWN DISPOSABLE) ×3 IMPLANT
KIT BASIN OR (CUSTOM PROCEDURE TRAY) ×2 IMPLANT
KIT TURNOVER KIT A (KITS) IMPLANT
MARKER SKIN DUAL TIP RULER LAB (MISCELLANEOUS) ×2 IMPLANT
MESH VENTRALEX ST 2.5 CRC MED (Mesh General) ×1 IMPLANT
NS IRRIG 1000ML POUR BTL (IV SOLUTION) ×2 IMPLANT
PACK GENERAL/GYN (CUSTOM PROCEDURE TRAY) ×2 IMPLANT
STAPLER VISISTAT 35W (STAPLE) ×1 IMPLANT
SUT MNCRL AB 4-0 PS2 18 (SUTURE) ×1 IMPLANT
SUT NOVA 1 T20/GS 25DT (SUTURE) ×1 IMPLANT
SUT NOVA NAB GS-21 0 18 T12 DT (SUTURE) ×1 IMPLANT
SUT PDS AB 1 CTX 36 (SUTURE) IMPLANT
SUT VIC AB 2-0 CT2 27 (SUTURE) IMPLANT
SUT VIC AB 3-0 SH 18 (SUTURE) ×1 IMPLANT
TOWEL OR 17X26 10 PK STRL BLUE (TOWEL DISPOSABLE) ×2 IMPLANT
TRAY FOLEY MTR SLVR 16FR STAT (SET/KITS/TRAYS/PACK) IMPLANT

## 2021-10-13 NOTE — Op Note (Signed)
Operative Note  Pre-operative Diagnosis:  epigastric hernia  Post-operative Diagnosis:  same  Surgeon:  Armandina Gemma, MD  Assistant:  none   Procedure:  open repair epigastric hernia with mesh patch (6.4 cm Bard Ventralex patch)  Anesthesia:  general  Estimated Blood Loss:  minimal  Drains: none         Specimen: none  Indications: Patient is a 65 year old female with a symptomatic epigastric hernia who presents for repair.  Procedure:  The patient was seen in the pre-op holding area. The risks, benefits, complications, treatment options, and expected outcomes were previously discussed with the patient. The patient agreed with the proposed plan and has signed the informed consent form.  The patient was brought to the operating room by the surgical team, identified as Kara Bailey and the procedure verified. A "time out" was completed and the above information confirmed.  Following induction of general anesthesia, the patient is positioned and then prepped and draped in the usual aseptic fashion.  After ascertaining that an adequate level of anesthesia been achieved, a midline incision is made above the level of the umbilicus.  Dissection is carried into the subcutaneous tissues.  Herniated preperitoneal adipose tissue was identified.  This is dissected down to the abdominal wall.  Dissection reveals 2 fascial defects.  The more cephalad defect measures 3 cm in diameter.  The smaller defect is slightly caudad but above the level of the umbilicus.  It measures 2 cm in diameter.  Adipose tissue was dissected out of the subcutaneous tissues and reduced back within the peritoneal cavity.  A Bard Ventralex 6.4 cm mesh patch is selected.  It is prepared and inserted into the preperitoneal space so that it underlies both fascial defects.  Both defects are then closed with interrupted 0 Novafil and #1 Novafil sutures incorporating the underlying mesh into the closure.  Good approximation is  achieved without tension.  Local anesthetic is infiltrated circumferentially.  Subcutaneous tissues are closed with interrupted 3-0 Vicryl sutures.  Skin is anesthetized with local anesthetic.  Skin edges are reapproximated with a running 4-0 Monocryl subcuticular suture.  Wound is washed and dried and Dermabond is applied as dressing.  An abdominal binder is applied.  Patient is awakened from anesthesia and transported to the recovery room.  The patient tolerated the procedure well.   Armandina Gemma, [facility] Office: [phone]

## 2021-10-13 NOTE — Anesthesia Postprocedure Evaluation (Signed)
Anesthesia Post Note  Patient: Kara Bailey  Procedure(s) Performed: OPEN REPAIR EPIGASTRIC HERNIA WITH MESH PATCH (Abdomen)     Patient location during evaluation: PACU Anesthesia Type: General Level of consciousness: awake and alert, patient cooperative and oriented Pain management: pain level controlled Vital Signs Assessment: post-procedure vital signs reviewed and stable Respiratory status: spontaneous breathing, nonlabored ventilation and respiratory function stable Cardiovascular status: blood pressure returned to baseline and stable Postop Assessment: no apparent nausea or vomiting, adequate PO intake and able to ambulate Anesthetic complications: no   No notable events documented.  Last Vitals:  Vitals:   10/13/21 0845 10/13/21 0900  BP: 132/68 (!) 144/65  Pulse: 83 80  Resp: 14 (!) 9  Temp:    SpO2: 97% 98%    Last Pain:  Vitals:   10/13/21 0900  TempSrc:   PainSc: 0-No pain                 Mead Slane,E. Gianny Killman

## 2021-10-13 NOTE — Anesthesia Procedure Notes (Signed)
Procedure Name: Intubation Date/Time: 10/13/2021 7:31 AM Performed by: Niel Hummer, CRNA Pre-anesthesia Checklist: Patient identified, Emergency Drugs available, Patient being monitored and Suction available Patient Re-evaluated:Patient Re-evaluated prior to induction Oxygen Delivery Method: Circle system utilized Preoxygenation: Pre-oxygenation with 100% oxygen Induction Type: IV induction Ventilation: Mask ventilation without difficulty Laryngoscope Size: Mac and 4 Grade View: Grade I Tube type: Oral Tube size: 7.0 mm Number of attempts: 1 Airway Equipment and Method: Stylet Placement Confirmation: ETT inserted through vocal cords under direct vision, breath sounds checked- equal and bilateral and positive ETCO2 Secured at: 23 cm Tube secured with: Tape Dental Injury: Teeth and Oropharynx as per pre-operative assessment

## 2021-10-13 NOTE — Interval H&P Note (Signed)
History and Physical Interval Note:  10/13/2021 6:57 AM  Kara Bailey  has presented today for surgery, with the diagnosis of EPIGASTRIC HERNIA.  The various methods of treatment have been discussed with the patient and family. After consideration of risks, benefits and other options for treatment, the patient has consented to    Procedure(s): [place] (N/A) as a surgical intervention.    The patient's history has been reviewed, patient examined, no change in status, stable for surgery.  I have reviewed the patient's chart and labs.  Questions were answered to the patient's satisfaction.    Armandina Gemma, [facility] Office: [place]

## 2021-10-13 NOTE — Discharge Instructions (Signed)
[facility]  HERNIA REPAIR POST OP INSTRUCTIONS  Always review your discharge instruction sheet given to you by the facility where your surgery was performed.  A  prescription for pain medication may be sent to your pharmacy on discharge.  Take your pain medication as prescribed.  If narcotic pain medicine is not needed, then you may take acetaminophen (Tylenol) or ibuprofen (Advil) as needed.  Take your usually prescribed medications unless otherwise directed.  If you need a refill on your pain medication, please contact your pharmacy.  They will contact our office to request authorization. Prescriptions will not be filled after 5:00 PM daily or on weekends.  You should follow a light diet the first 24 hours after arrival home, such as soup and crackers or toast.  Be sure to include plenty of fluids daily.  Resume your normal diet the day after surgery.  Most patients will experience some swelling and bruising around the surgical site.  Ice packs and reclining will help.  Swelling and bruising can take several days to resolve.   It is common to experience some constipation if taking pain medication after surgery.  Increasing fluid intake and taking a stool softener (such as Colace) will usually help or prevent this problem from occurring.  A mild laxative (Milk of Magnesia or Miralax) should be taken according to package directions if there is no bowel movement after 48 hours.  You will likely have Dermabond (topical glue) over your incisions.  This seals the incisions and allows you to bathe and shower at any time after your surgery.  Glue should remain in place for up to 10 days.  It may be removed after 10 days by pealing off the Dermabond material or using Vaseline or naval jelly to remove.  ACTIVITIES:  You may resume regular (light) daily activities beginning the next day - such as daily self-care, walking, climbing stairs - gradually increasing activities as tolerated.  You  may have sexual intercourse when it is comfortable.  Refrain from any heavy lifting or straining until approved by your doctor.  You may drive when you are no longer taking prescription pain medication, when you can comfortably wear a seatbelt, and when you can safely maneuver your car and apply the brakes.  You should see your doctor in the office for a follow-up appointment approximately 2-3 weeks after your surgery.  Make sure that you call for this appointment within a day or two after you arrive home to insure a convenient appointment time.  WHEN TO CALL YOUR DOCTOR: Fever greater than 101.0 Inability to urinate Persistent nausea and/or vomiting Extreme swelling or bruising Continued bleeding from incision Increased pain, redness, or drainage from the incision  The clinic staff is available to answer your questions during regular business hours.  Please don't hesitate to call and ask to speak to one of the nurses for clinical concerns.  If you have a medical emergency, go to the nearest emergency room or call 911.  A surgeon from [facility] is always on call for the hospital.   [facility] [address], [address], , [place]  [address]  [phone] ? [phone] ? FAX [phone] 

## 2021-10-13 NOTE — Transfer of Care (Signed)
Immediate Anesthesia Transfer of Care Note  Patient: Kara Bailey  Procedure(s) Performed: OPEN REPAIR EPIGASTRIC HERNIA WITH MESH PATCH (Abdomen)  Patient Location: PACU  Anesthesia Type:General  Level of Consciousness: awake, alert  and oriented  Airway & Oxygen Therapy: Patient Spontanous Breathing and Patient connected to face mask oxygen  Post-op Assessment: Report given to RN, Post -op Vital signs reviewed and stable and Patient moving all extremities X 4  Post vital signs: Reviewed and stable  Last Vitals:  Vitals Value Taken Time  BP 143/73   Temp    Pulse 90   Resp 14   SpO2 98     Last Pain:  Vitals:   10/13/21 0602  TempSrc:   PainSc: 0-No pain         Complications: No notable events documented.

## 2021-10-16 ENCOUNTER — Ambulatory Visit (INDEPENDENT_AMBULATORY_CARE_PROVIDER_SITE_OTHER): Payer: Medicare HMO | Admitting: Adult Health

## 2021-10-16 ENCOUNTER — Other Ambulatory Visit: Payer: Self-pay

## 2021-10-16 ENCOUNTER — Encounter (HOSPITAL_COMMUNITY): Payer: Self-pay | Admitting: Surgery

## 2021-10-16 VITALS — BP 155/85 | HR 99 | Ht 66.0 in | Wt 160.0 lb

## 2021-10-16 DIAGNOSIS — I6381 Other cerebral infarction due to occlusion or stenosis of small artery: Secondary | ICD-10-CM

## 2021-10-16 NOTE — Patient Instructions (Addendum)
Continue aspirin 81 mg daily  and atorvastatin 80mg  daily for secondary stroke prevention  Continue plavix 75mg  daily in addition to aspirin until Dr. Gwenlyn Found tells you otherwise   Continue to follow up with PCP regarding cholesterol and blood pressure management  Maintain strict control of hypertension with blood pressure goal below 130/90 and cholesterol with LDL cholesterol (bad cholesterol) goal below 70 mg/dL.   Very important to completely quit tobacco use as continued use greatly increases risk of additional strokes     Followup in the future with me in 6 months or call earlier if needed      Thank you for coming to see [place] at [facility]. I hope we have been able to provide you high quality care today.  You may receive a patient satisfaction survey over the next few weeks. We would appreciate your feedback and comments so that we may continue to improve ourselves and the health of our patients.

## 2021-10-16 NOTE — Progress Notes (Signed)
[facility] [address] [place]. [place] [address] [phone]       STROKE FOLLOW UP NOTE  [name] Date of Birth:  01/15/1956 Medical Record Number:  [id number]   Reason for Referral: stroke follow up    SUBJECTIVE:   CHIEF COMPLAINT:  Chief Complaint  Patient presents with   Follow-up    Rm 3 alone  Pt is well and stable, no new stroke concerns      HPI:   Update 10/16/2021 JM: Returns for 63-monthstroke follow-up unaccompanied  Overall stable from stroke standpoint -denies new stroke/TIA symptoms Reports residual occasional word finding difficulty but denies residual weakness, numbness or dysphagia Completed PT 12/1 and SLP 11/2 with partially met goals and maximum rehab potential   Compliant on aspirin and atorvastatin as well as plavix s/p stent -denies side effects Blood pressure today 155/85 -occasionally monitors at home which has been stable Reports recent lab work by PCP which is satisfactory (unable to personally view via epic) Continued tobacco use approx 4 cig/day - continues to try to gradually decrease daily amount  Unable to state exact EtOH amount but denies excessive use  S/p stented L extrailiac artery and mid left SFA 92/4/2353without complication S/p repair of symptomatic epigastric hernia 161/4without complication  No new concerns at this time    History provided for reference purposes only Initial visit 04/11/2021 JM: [name] being seen for hospital follow-up unaccompanied  Reports residual speech difficulties and right-sided weakness with numbness which has been slowly improving. Mild dysphagia with improvement currently on regular diet without difficulty.  Ambulates without assistive device and denies any recent falls. Since completed home health therapies - plans to start with neuro rehab Friday  She does live with her significant other and able to complete ADLs and majority of IADLs with assistance  needed at times. Denies new stroke/TIA symptoms  Completed 3 weeks DAPT remains on aspirin alone as well as atorvastatin 80 mg daily without associated side effects Blood pressure today 145/88 - occassionally monitors at home which has been stable  Continued tobacco use but has been able to cut back with use of patches Continue EtOH use approximately a 12 pack/week   No further concerns at this time  Stroke admission 12/29/2020  Kara ANTONIOis a 65y.o. female with a history of smoking, PAD, HTN, ETOH abuse, chronic hepatitis C with hepatic coma,GAD, esophageal stricture,  recent episode of possible hematemesis, [facility] with right-sided weakness, difficulty swallowing and difficulty speaking x3 days.  Personally reviewed hospitalization pertinent progress notes, lab work and imaging summary provided. [facility]  Evaluated by Dr. SLeonie Manand infarct likely secondary to small vessel disease.  CTA head/neck unremarkable.  2D echo EF 75%.  LDL 58.  A1c 5.5.  Recommended DAPT for 3 weeks and aspirin alone.  HTN stable.  Increase atorvastatin dose from 10 mg to 80 mg daily.  Tobacco cessation counseling provided.  EtOH use disorder on CIWA protocol.  MR brain showed areas of old hemorrhagic infarctions in the right superior cerebellum, right basal ganglia and radiating white matter tracts.  Residual deficits of moderate dysarthria, expressive aphasia, dysphagia (Dys3 NTL diet), mild right facial weakness and right hemiparesis.  Evaluated by therapies who recommended HH PT/OT/SLP and discharged home in stable condition on 12/31/2020.  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   Stroke ([place])    just slurred speech   Tachycardia 08/2014   Ventral hernia    Weakness 08/2014    PSH:  Past Surgical History:  Procedure Laterality Date   ABDOMINAL AORTOGRAM W/LOWER EXTREMITY N/A 07/13/2021   Procedure: ABDOMINAL AORTOGRAM W/LOWER EXTREMITY;  Surgeon: Lorretta Harp, MD;  Location: [place] [facility];  Service: Cardiovascular;  Laterality: N/A;   APPENDECTOMY     CATARACT EXTRACTION, BILATERAL     COLONOSCOPY     COLONOSCOPY WITH PROPOFOL N/A 04/30/2016   Procedure: COLONOSCOPY WITH PROPOFOL;  Surgeon: Daneil Dolin, MD;  [facility];  Service: Endoscopy;  Laterality: N/A;  [place] N/A 10/13/2021   Procedure: OPEN REPAIR EPIGASTRIC HERNIA WITH MESH PATCH;  Surgeon: Armandina Gemma, MD;  Location: [facility];  Service: General;  Laterality: N/A;   ESOPHAGOGASTRODUODENOSCOPY     approximately 2010   ESOPHAGOGASTRODUODENOSCOPY (EGD) WITH PROPOFOL N/A 04/30/2016   Procedure: ESOPHAGOGASTRODUODENOSCOPY (EGD) WITH PROPOFOL;  Surgeon: Daneil Dolin, MD;  [facility];  Service: Endoscopy;  Laterality: N/A;   LEFT HEART CATHETERIZATION WITH CORONARY ANGIOGRAM N/A 10/29/2014   07-14-20- pt denies this Procedure: LEFT HEART CATHETERIZATION WITH CORONARY ANGIOGRAM;  Surgeon: Burnell Blanks, MD;  Location: [facility];  Service: Cardiovascular;  Laterality: N/A;   MALONEY DILATION N/A 04/30/2016   Procedure: Venia Minks DILATION;  Surgeon: Daneil Dolin, MD;  [facility];  Service: Endoscopy;  Laterality: N/A;   PERIPHERAL  VASCULAR INTERVENTION  07/13/2021   Procedure: PERIPHERAL VASCULAR INTERVENTION;  Surgeon: Lorretta Harp, MD;  Location: [place] [facility];  Service: Cardiovascular;;  left SFA left external iliac   POLYPECTOMY  04/30/2016   Procedure: POLYPECTOMY;  Surgeon: Daneil Dolin, MD;  [facility];  Service: Endoscopy;;  Sigmoid colon polyp removed via hot snare   UPPER GASTROINTESTINAL ENDOSCOPY      Social History:  Social History   Socioeconomic History   Marital status: Divorced    Spouse name: Not on file   Number of children: 2   Years of education: Not on file   Highest education level: Not on file  Occupational History   Occupation: retired  Tobacco Use   Smoking status: Every Day    Packs/day: 0.50    Years: 40.00    Pack years: 20.00    Types: Cigarettes    Start date: 11/12/1968   Smokeless tobacco: Never   Tobacco comments:    cutting back, wearing patches  Vaping Use   Vaping Use: Never used  Substance and Sexual Activity   Alcohol use: Yes    Alcohol/week: 0.0 standard drinks    Comment: beer on weekends (6-pack)   Drug  use: No   Sexual activity: Not Currently  Other Topics Concern   Not on file  Social History Narrative   Not on file   Social Determinants of Health   Financial Resource Strain: Not on file  Food Insecurity: Not on file  Transportation Needs: Not on file  Physical Activity: Not on file  Stress: Not on file  Social Connections: Not on file  Intimate Partner Violence: Not on file    Family History:  Family History  Problem Relation Age of Onset   Heart failure Mother    Heart attack Mother    Heart disease Mother    Hypertension Mother    Hyperlipidemia Mother    CVA Father    Alzheimer's disease Father    Cancer Neg Hx    Colon cancer Neg Hx    Esophageal cancer Neg Hx    Rectal cancer Neg Hx    Stomach cancer Neg Hx     Medications:   Current Outpatient Medications on File Prior to Visit  Medication Sig  Dispense Refill   albuterol (VENTOLIN HFA) 108 (90 Base) MCG/ACT inhaler Inhale 2 puffs into the lungs every 6 (six) hours as needed for shortness of breath or wheezing.     aspirin EC 81 MG EC tablet Take 1 tablet (81 mg total) by mouth daily. Swallow whole. 30 tablet 11   atorvastatin (LIPITOR) 80 MG tablet Take 1 tablet (80 mg total) by mouth daily. (Patient taking differently: Take 80 mg by mouth at bedtime.) 30 tablet 0   clopidogrel (PLAVIX) 75 MG tablet Take 1 tablet (75 mg total) by mouth daily. 90 tablet 1   cyclobenzaprine (FLEXERIL) 10 MG tablet Take 10 mg by mouth daily as needed for muscle spasms (Back pain).     diltiazem (TIAZAC) 180 MG 24 hr capsule Take 180 mg by mouth in the morning.     escitalopram (LEXAPRO) 20 MG tablet Take 20 mg by mouth in the morning.     famotidine (PEPCID) 40 MG tablet Take 40 mg by mouth at bedtime.     hydrOXYzine (ATARAX/VISTARIL) 50 MG tablet Take 50 mg by mouth 3 (three) times daily as needed for anxiety.     mirtazapine (REMERON) 30 MG tablet Take 0.5 tablets (15 mg total) by mouth at bedtime. (Patient taking differently: Take 30 mg by mouth at bedtime.) 30 tablet 5   Multiple Vitamin (MULTIVITAMIN WITH MINERALS) TABS tablet Take 1 tablet by mouth daily. 30 tablet 1   nicotine (NICODERM CQ - DOSED IN MG/24 HOURS) 21 mg/24hr patch Place 1 patch (21 mg total) onto the skin daily. (Patient taking differently: Place 21 mg onto the skin daily as needed.) 28 patch 0   pantoprazole (PROTONIX) 40 MG tablet Take 1 tablet (40 mg total) by mouth daily. (Patient taking differently: Take 40 mg by mouth in the morning.) 30 tablet 5   thiamine 100 MG tablet Take 1 tablet (100 mg total) by mouth daily. (Patient taking differently: Take 100 mg by mouth in the morning.) 30 tablet 0   traMADol (ULTRAM) 50 MG tablet Take 1-2 tablets (50-100 mg total) by mouth every 6 (six) hours as needed for moderate pain. 15 tablet 0   Current Facility-Administered Medications on File  Prior to Visit  Medication Dose Route Frequency Provider Last Rate Last Admin   sodium chloride flush (NS) 0.9 % injection 3 mL  3 mL Intravenous Q12H Lorretta Harp, MD        Allergies:  No Known Allergies    OBJECTIVE:  Physical Exam  Vitals:   10/16/21 1105  BP: (!) 155/85  Pulse: 99  Weight: 160 lb (72.6 kg)  Height: 5' 6"  (1.676 m)    Body mass index is 25.82 kg/m. No results found.  General: well developed, well nourished,  pleasant elderly African-American female, seated, in no evident distress Head: head normocephalic and atraumatic.   Neck: supple with no carotid or supraclavicular bruits Cardiovascular: regular rate and rhythm, no murmurs Musculoskeletal: no deformity  Neurologic Exam Mental Status: Awake and fully alert.  Mild to moderate dysarthria and mild aphasia with occasional speech hesitancy.  Oriented to place and time. Recent memory subjectively impaired and remote memory intact. Attention span, concentration and fund of knowledge appropriate during visit. Mood and affect appropriate.  Cranial Nerves: Pupils equal, briskly reactive to light. Extraocular movements full without nystagmus. Visual fields full to confrontation. Hearing intact. Facial sensation intact.  Mild facial asymmetry.  Tongue, and palate moves normally and symmetrically.  Motor: mild right hand grip weakness otherwise UE strength intact.  Difficulty fully assessing BLE strength due to recent abdominal hernia repair - unable to appreciate weakness on limited testing Sensory.:  intact to light touch and intact to pinprick , position and vibratory sensation throughout all extremities.   Coordination: Rapid alternating movements mildly decreased right hand. Finger-to-nose and heel-to-shin performed accurately bilaterally. Gait and Station: Arises from chair with mild difficulty (d/t recent hernia repair). Stance is normal. Gait demonstrates broad-based gait with normal stride length and mild  imbalance without use of assistive device.  Unable to tandem walk and heel toe.  Reflexes: 1+ and symmetric. Toes downgoing.         ASSESSMENT: Kara Bailey is a 65 y.o. year old female with left thalamic ischemic stroke likely secondary to small vessel disease on 12/29/2020. Vascular risk factors include HTN, HLD, PAD, EtOH abuse, tobacco use, and advanced age.      PLAN:  Left thalamic stroke:  Residual deficit: mild speech/language deficit and mild RUE weakness. Completed therapies meeting maximum rehab potential.  Encouraged her to continue HEP as advised and routine physical activity/exercise Continue aspirin 81 mg daily  and atorvastatin 80 mg daily for secondary stroke prevention.   Discussed secondary stroke prevention measures and importance of close PCP follow up for aggressive stroke risk factor management  Our office does not manage any of her prescribed medications  HTN: BP goal <130/90.  Slightly elevated on current regimen but stable at home per pt. Monitored by PCP HLD: LDL goal <70. On atorvastatin 80 mg daily.  Prior lipid panel satisfactory per pt (unable to view via epic) PAD: s/p left extrailiac artery and mid left SFA stent 07/13/2021 by Dr. Gwenlyn Found - remains on DAPT and plans on repeat lower extremity arterial Dopplers around 01/2022 per recent OV note Tobacco and EtOH use: Discussed importance of complete cessation as continued use greatly increases risk of recurrent stroke. Advised her to f/u with PCP if further assistance is needed    Follow up in 6 months or call earlier if needed   CC:  Andree Moro, DO  I spent 31 minutes of face-to-face and non-face-to-face time with patient.  This included previsit chart review, lab review, study review, electronic health record documentation, patient education and discussion regarding history of prior stroke and residual deficits, importance of aggressive stroke risk factor management and secondary stroke prevention  measures and answered all other questions to patient satisfaction   Janett Billow  Caryn Section  [facility] [address] [place],  [address]  Phone [phone] Fax [phone] Note: This document was prepared with digital dictation and possible smart phrase technology. Any transcriptional errors that result from this process are unintentional.

## 2021-12-20 ENCOUNTER — Other Ambulatory Visit: Payer: Self-pay | Admitting: Cardiology

## 2021-12-20 NOTE — Telephone Encounter (Signed)
This is Dr. Berry's pt 

## 2022-01-01 IMAGING — RF DG SWALLOWING FUNCTION
12 of 17 series · 12 of 24 positions shown · non-contrast
Comparison: None.

CLINICAL DATA: Dysphagia.

EXAM:
MODIFIED BARIUM SWALLOW
TECHNIQUE: Different consistencies of barium were administered orally to the
patient by the Speech Pathologist. Imaging of the pharynx was
performed in the lateral projection. The radiologist was present in
the fluoroscopy room for this study, providing personal supervision.
FLUOROSCOPY TIME:  Fluoroscopy Time:  2 minutes and 34 seconds
Radiation Exposure Index (if provided by the fluoroscopic device):
Number of Acquired Spot Images: 0

[Series 1: run · 1 of 236 frames shown (1 of 12)]
[frame 201/236]
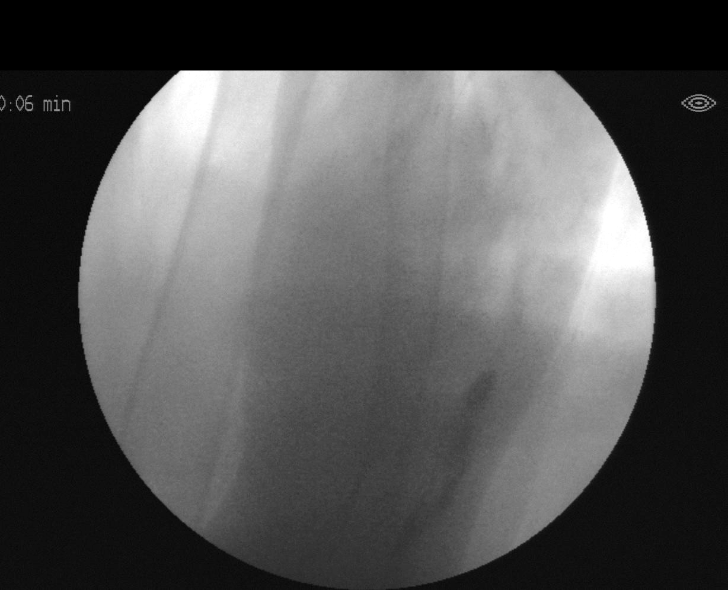

[Series 3: run · 1 of 21 frames shown (2 of 12)]
[frame 11/21]
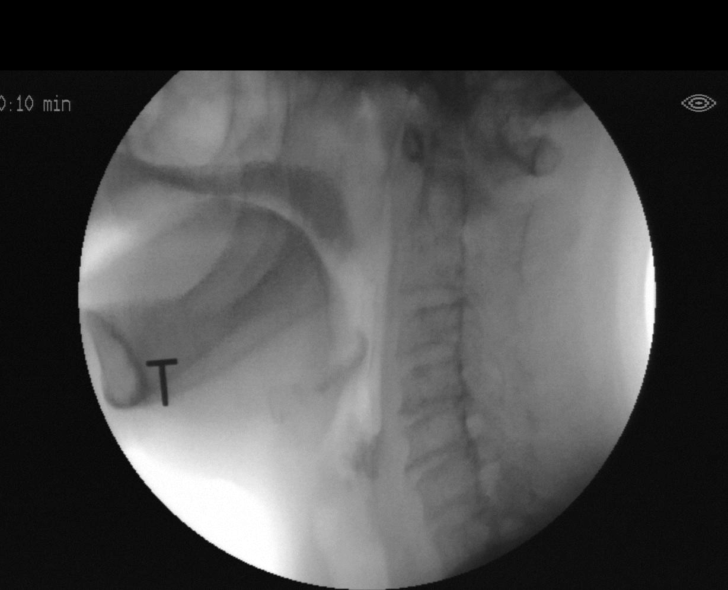

[Series 4: run · 1 of 194 frames shown (3 of 12)]
[frame 165/194]
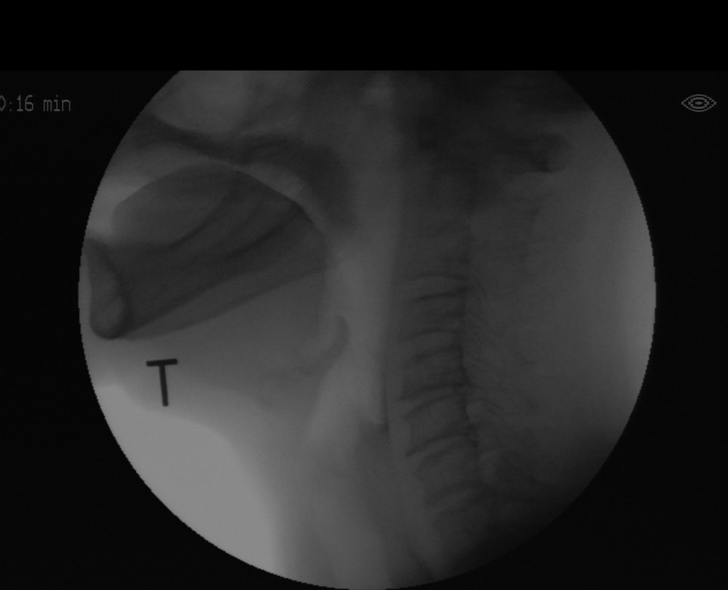

[Series 6: run · 1 of 316 frames shown (4 of 12)]
[frame 4/316]
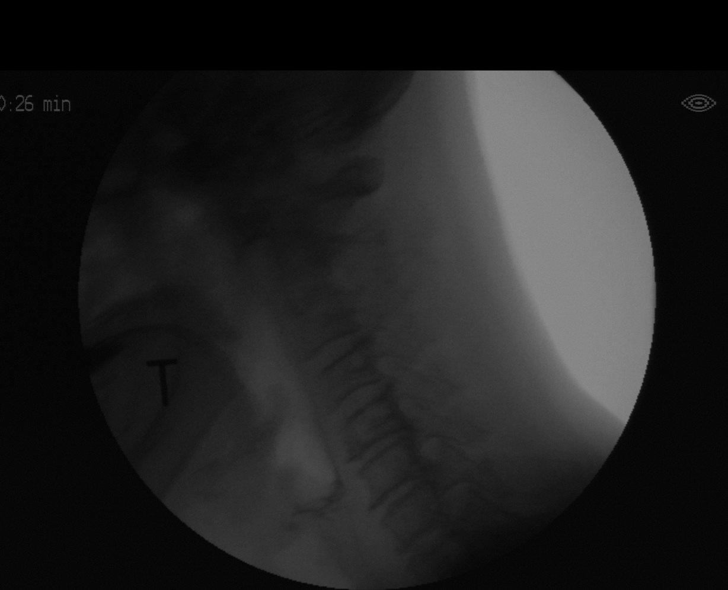

[Series 7: run · 1 of 303 frames shown (5 of 12)]
[frame 152/303]
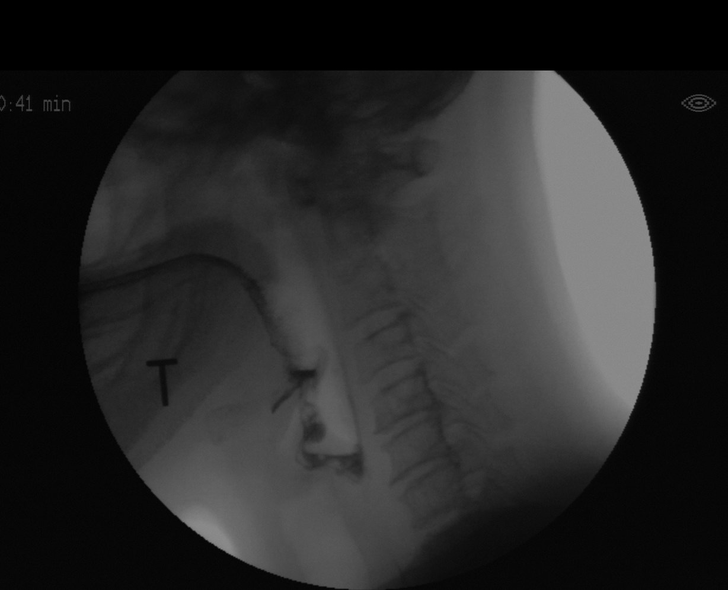

[Series 9: run · 1 of 146 frames shown (6 of 12)]
[frame 1/146]
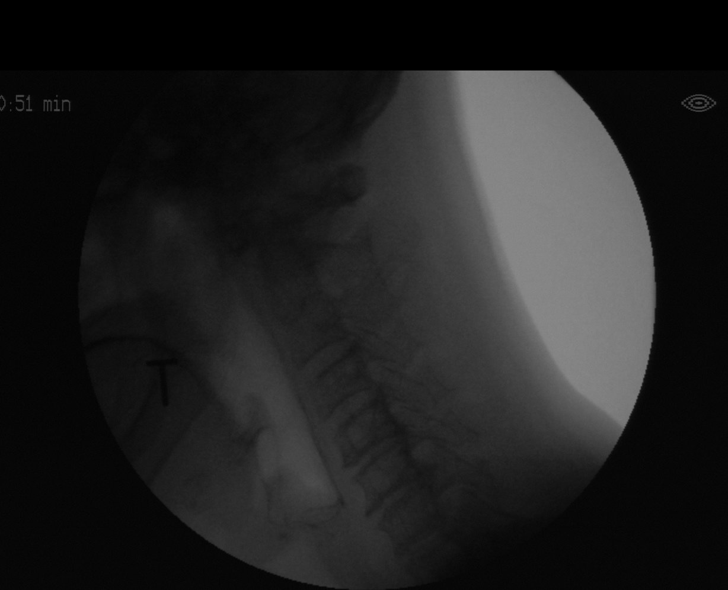

[Series 10: run · 1 of 624 frames shown (7 of 12)]
[frame 313/624]
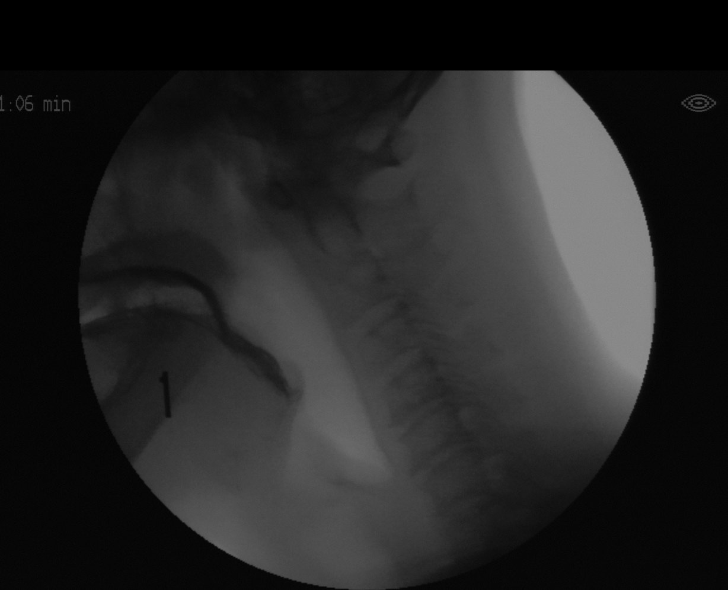

[Series 12: run · 1 of 157 frames shown (8 of 12)]
[frame 1/157]
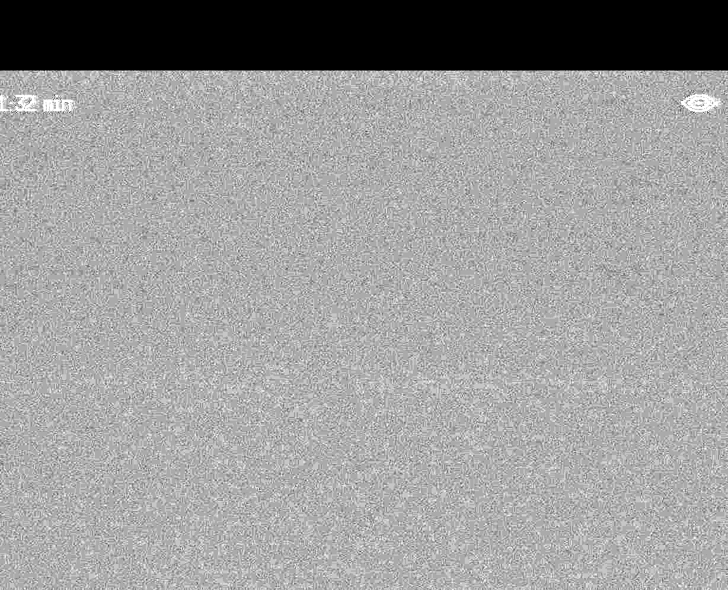

[Series 13: run · 1 of 801 frames shown (9 of 12)]
[frame 401/801]
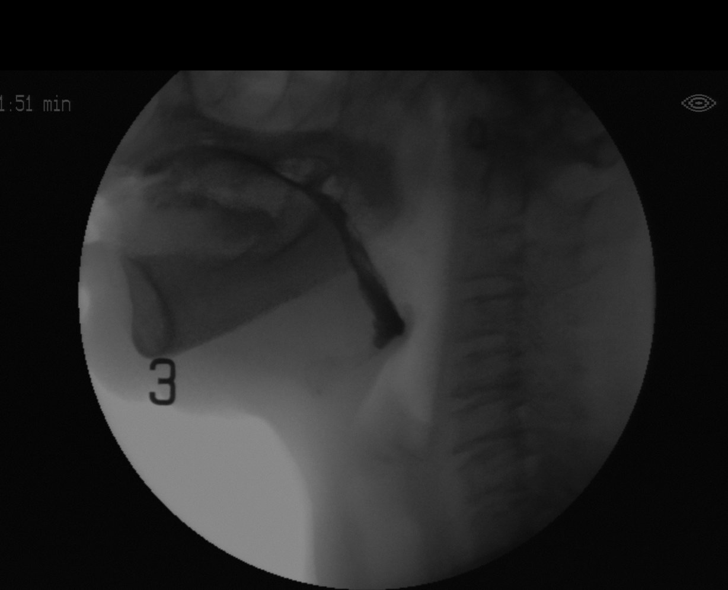

[Series 14: run · 1 of 393 frames shown (10 of 12)]
[frame 335/393]
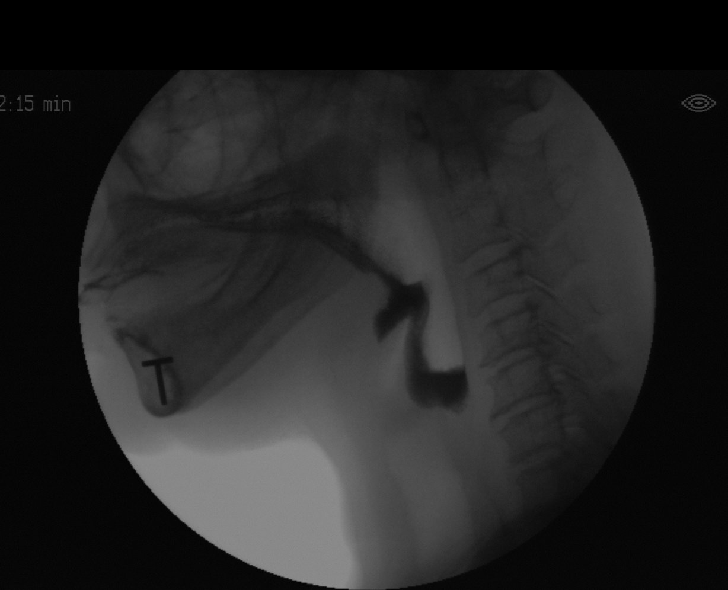

[Series 16: run · 1 of 114 frames shown (11 of 12)]
[frame 58/114]
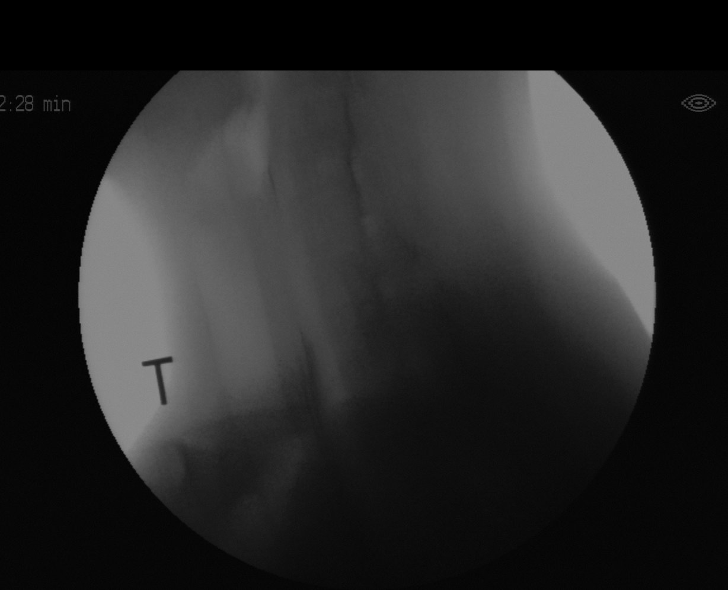

[Series 17: run · 1 of 133 frames shown (12 of 12)]
[frame 114/133]
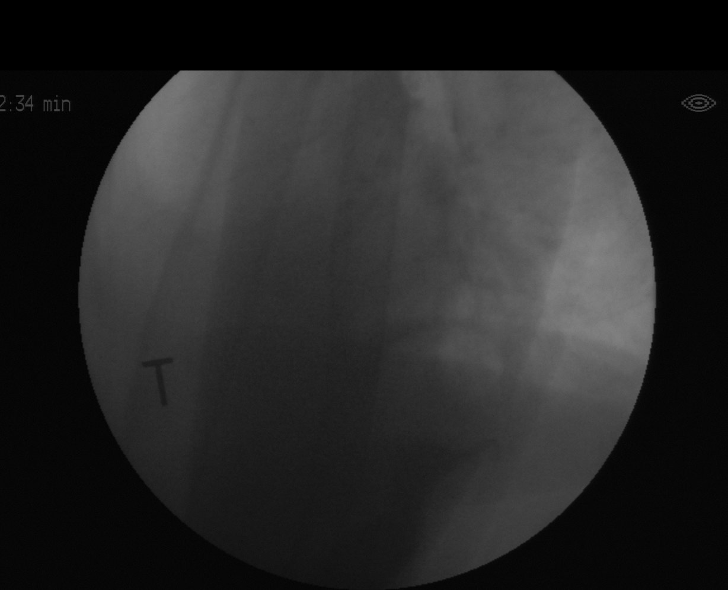

[12 of 24 positions shown; findings below may reference images not displayed]

FINDINGS: Marked delayed oral transit.

Marked delayed swallow trigger to the piriformis sinuses.

Once a swallow is triggered the airway is protected. No aspiration
was demonstrated.

No retention.

The 13 mm barium pill eventually passed into the stomach after
swallowing the applesauce.
IMPRESSION: 1. Marked delayed oral transit.
2. Marked delayed swallow trigger.
3. No aspiration.
4. The 13 mm barium pill eventually passed into the stomach after
swallowing the applesauce.

Please refer to the Speech Pathologists report for complete details
and recommendations.

## 2022-02-12 ENCOUNTER — Ambulatory Visit (HOSPITAL_COMMUNITY)
Admission: RE | Admit: 2022-02-12 | Discharge: 2022-02-12 | Disposition: A | Discharge status: Home / Self Care | Payer: Medicare HMO | Source: Ambulatory Visit | Attending: Internal Medicine | Admitting: Internal Medicine

## 2022-02-12 ENCOUNTER — Ambulatory Visit (HOSPITAL_BASED_OUTPATIENT_CLINIC_OR_DEPARTMENT_OTHER)
Admission: RE | Admit: 2022-02-12 | Discharge: 2022-02-12 | Disposition: A | Discharge status: Home / Self Care | Payer: Medicare HMO | Source: Ambulatory Visit | Attending: Cardiovascular Disease | Admitting: Cardiovascular Disease

## 2022-02-12 DIAGNOSIS — I739 Peripheral vascular disease, unspecified: Secondary | ICD-10-CM

## 2022-02-12 DIAGNOSIS — Z95828 Presence of other vascular implants and grafts: Secondary | ICD-10-CM | POA: Diagnosis not present

## 2022-03-07 ENCOUNTER — Ambulatory Visit (INDEPENDENT_AMBULATORY_CARE_PROVIDER_SITE_OTHER): Payer: Medicare HMO | Admitting: Cardiovascular Disease

## 2022-03-07 ENCOUNTER — Other Ambulatory Visit: Payer: Self-pay

## 2022-03-07 ENCOUNTER — Encounter: Payer: Self-pay | Admitting: Cardiovascular Disease

## 2022-03-07 DIAGNOSIS — I739 Peripheral vascular disease, unspecified: Secondary | ICD-10-CM | POA: Diagnosis not present

## 2022-03-07 MED ORDER — SODIUM CHLORIDE 0.9% FLUSH: 3.0000 mL | Freq: Two times a day (BID) | INTRAVENOUS | Status: DC

## 2022-03-07 NOTE — Assessment & Plan Note (Signed)
History of PAD s/p left extrailiac artery stenting as well as stenting of the mid left SFA with three-vessel runoff.  She enjoyed improvement in her claudication for several months but had slow recurrence of her symptoms.  Her most recent Doppler studies performed 02/12/2022 revealed a decline in her left ABI from 1.02-0.87.  Her iliac stent is patent.  She has recurrent disease in her left SFA.  She does continue to smoke.  She wishes to proceed with angiography and repeat endovascular therapy. ?

## 2022-03-07 NOTE — H&P (View-Only) (Signed)
? ? ? ?03/07/2022 ?[name]   ?07/20/1956  ?[id number] ? ?Primary Physician Andree Moro, DO ?Primary Cardiologist: Lorretta Harp MD [name], [place] ? ?HPI:  Kara Bailey is a 66 y.o.  mildly overweight divorced African-American female mother of 2, grandmother of 9 grandchildren referred by Dr. Audie Box, her cardiologist, for peripheral vascular evaluation.  She is retired from doing factory work.  I last saw her in the office 08/09/2021. Risk factors include smoking up to 1/2 pack a day for the last 50 years and treated hypertension.  There is no family history.  She is never had heart attack or stroke.  Denies chest pain or shortness of breath.  She complains of left calf lifestyle of any claudication which she has had for several years.  Recent Doppler studies performed 11/03/2020 revealed a right ABI 1.0 and a left of 0.89 with moderate disease noted in the mid left SFA ?  ?She had suffered a stroke when I saw her last and her peripheral vascular work-up was put on hold.  Her neurologic deficits have since resolved except for dysarthria and she is participating physical therapy.  She does continue to smoke however.  She tells me that her right leg is "tighter than her left but really denies claudication type symptoms.  Interestingly, her right ABI is normal on her left is 0.89.  She has no obstructive disease for the most part on the right side with mild to moderate disease in her left SFA. ? ?She continues to do outpatient rehab/physical therapy.  She is walking much better but continues to complain of left calf claudication.  She wishes to proceed with outpatient peripheral angiography and endovascular therapy. ? ?I performed abdominal aortography with bifemoral runoff and endovascular therapy 07/13/2021.  I stented her left extrailiac artery and mid left SFA with excellent result.  She was discharged home the same day.  Follow-up Dopplers performed 07/20/2021 revealed a left ABI of 1.02 with a  patent stent in her external iliac artery and SFA.  She did appear to have slightly increased velocities in her distal common femoral but there was no disease there on angiography.  Her claudication has improved as well.  She wishes to go back to rehab.  She also has an umbilical hernia and hernia which apparently needs to be surgically fixed although I prefer her to have uninterrupted DAPT for at least 3 months. ? ?Since I saw her several months ago she has developed recurrent claudication on the left.  She did enjoy 3 to 4 months of improved claudication after her endovascular procedure.  Her repeat Doppler showed decline in her left ABI with recurrent disease in her left SFA.  She wishes to proceed with repeat angiography and intervention.  Unfortunately, she does continue to smoke. ? ? ?Current Meds  ?Medication Sig  ? albuterol (VENTOLIN HFA) 108 (90 Base) MCG/ACT inhaler Inhale 2 puffs into the lungs every 6 (six) hours as needed for shortness of breath or wheezing.  ? aspirin EC 81 MG EC tablet Take 1 tablet (81 mg total) by mouth daily. Swallow whole.  ? atorvastatin (LIPITOR) 80 MG tablet Take 1 tablet (80 mg total) by mouth daily. (Patient taking differently: Take 80 mg by mouth at bedtime.)  ? clopidogrel (PLAVIX) 75 MG tablet TAKE 1 TABLET EVERY DAY  ? cyclobenzaprine (FLEXERIL) 10 MG tablet Take 10 mg by mouth daily as needed for muscle spasms (Back pain).  ? diltiazem (TIAZAC) 180 MG 24  hr capsule Take 180 mg by mouth in the morning.  ? escitalopram (LEXAPRO) 20 MG tablet Take 20 mg by mouth in the morning.  ? famotidine (PEPCID) 40 MG tablet Take 40 mg by mouth at bedtime.  ? hydrOXYzine (ATARAX/VISTARIL) 50 MG tablet Take 50 mg by mouth 3 (three) times daily as needed for anxiety.  ? mirtazapine (REMERON) 30 MG tablet Take 0.5 tablets (15 mg total) by mouth at bedtime. (Patient taking differently: Take 30 mg by mouth at bedtime.)  ? Multiple Vitamin (MULTIVITAMIN WITH MINERALS) TABS tablet Take 1  tablet by mouth daily.  ? nicotine (NICODERM CQ - DOSED IN MG/24 HOURS) 21 mg/24hr patch Place 1 patch (21 mg total) onto the skin daily. (Patient taking differently: Place 21 mg onto the skin daily as needed.)  ? pantoprazole (PROTONIX) 40 MG tablet Take 1 tablet (40 mg total) by mouth daily. (Patient taking differently: Take 40 mg by mouth in the morning.)  ? thiamine 100 MG tablet Take 1 tablet (100 mg total) by mouth daily. (Patient taking differently: Take 100 mg by mouth in the morning.)  ? traMADol (ULTRAM) 50 MG tablet Take 1-2 tablets (50-100 mg total) by mouth every 6 (six) hours as needed for moderate pain.  ? ?Current Facility-Administered Medications for the 03/07/22 encounter (Office Visit) with Lorretta Harp, MD  ?Medication  ? sodium chloride flush (NS) 0.9 % injection 3 mL  ?  ? ?No Known Allergies ? ?Social History  ? ?Socioeconomic History  ? Marital status: Divorced  ?  Spouse name: Not on file  ? Number of children: 2  ? Years of education: Not on file  ? Highest education level: Not on file  ?Occupational History  ? Occupation: retired  ?Tobacco Use  ? Smoking status: Every Day  ?  Packs/day: 0.50  ?  Years: 40.00  ?  Pack years: 20.00  ?  Types: Cigarettes  ?  Start date: 11/12/1968  ? Smokeless tobacco: Never  ? Tobacco comments:  ?  cutting back, wearing patches  ?Vaping Use  ? Vaping Use: Never used  ?Substance and Sexual Activity  ? Alcohol use: Yes  ?  Alcohol/week: 0.0 standard drinks  ?  Comment: beer on weekends (6-pack)  ? Drug use: No  ? Sexual activity: Not Currently  ?Other Topics Concern  ? Not on file  ?Social History Narrative  ? Not on file  ? ?Social Determinants of Health  ? ?Financial Resource Strain: Not on file  ?Food Insecurity: Not on file  ?Transportation Needs: Not on file  ?Physical Activity: Not on file  ?Stress: Not on file  ?Social Connections: Not on file  ?Intimate Partner Violence: Not on file  ?  ? ?Review of Systems: ?General: negative for chills, fever,  night sweats or weight changes.  ?Cardiovascular: negative for chest pain, dyspnea on exertion, edema, orthopnea, palpitations, paroxysmal nocturnal dyspnea or shortness of breath ?Dermatological: negative for rash ?Respiratory: negative for cough or wheezing ?Urologic: negative for hematuria ?Abdominal: negative for nausea, vomiting, diarrhea, bright red blood per rectum, melena, or hematemesis ?Neurologic: negative for visual changes, syncope, or dizziness ?All other systems reviewed and are otherwise negative except as noted above. ? ? ? ?Blood pressure 120/72, pulse 95, height '5\' 6"'$  (1.676 m), weight 140 lb (63.5 kg).  ?General appearance: alert and no distress ?Neck: no adenopathy, no carotid bruit, no JVD, supple, symmetrical, trachea midline, and thyroid not enlarged, symmetric, no tenderness/mass/nodules ?Lungs: clear to auscultation bilaterally ?Heart: regular  rate and rhythm, S1, S2 normal, no murmur, click, rub or gallop ?Extremities: extremities normal, atraumatic, no cyanosis or edema ?Pulses: Diminished left pedal pulse ?Skin: Skin color, texture, turgor normal. No rashes or lesions ?Neurologic: Grossly normal ? ?EKG sinus rhythm at 95 with nonspecific ST and T wave changes.  I personally reviewed this EKG. ? ?ASSESSMENT AND PLAN:  ? ?Peripheral arterial disease ([place]) ?History of PAD s/p left extrailiac artery stenting as well as stenting of the mid left SFA with three-vessel runoff.  She enjoyed improvement in her claudication for several months but had slow recurrence of her symptoms.  Her most recent Doppler studies performed 02/12/2022 revealed a decline in her left ABI from 1.02-0.87.  Her iliac stent is patent.  She has recurrent disease in her left SFA.  She does continue to smoke.  She wishes to proceed with angiography and repeat endovascular therapy. ? ? ? ? ?Lorretta Harp MD FACP,FACC,FAHA, FSCAI ?03/07/2022 ?11:07 AM ?

## 2022-03-07 NOTE — Progress Notes (Signed)
? ? ? ?03/07/2022 ?[name]   ?1956/01/29  ?[id number] ? ?Primary Physician Andree Moro, DO ?Primary Cardiologist: Lorretta Harp MD [name], [place] ? ?HPI:  Kara Bailey is a 66 y.o.  mildly overweight divorced African-American female mother of 2, grandmother of 9 grandchildren referred by Dr. Audie Box, her cardiologist, for peripheral vascular evaluation.  She is retired from doing factory work.  I last saw her in the office 08/09/2021. Risk factors include smoking up to 1/2 pack a day for the last 50 years and treated hypertension.  There is no family history.  She is never had heart attack or stroke.  Denies chest pain or shortness of breath.  She complains of left calf lifestyle of any claudication which she has had for several years.  Recent Doppler studies performed 11/03/2020 revealed a right ABI 1.0 and a left of 0.89 with moderate disease noted in the mid left SFA ?  ?She had suffered a stroke when I saw her last and her peripheral vascular work-up was put on hold.  Her neurologic deficits have since resolved except for dysarthria and she is participating physical therapy.  She does continue to smoke however.  She tells me that her right leg is "tighter than her left but really denies claudication type symptoms.  Interestingly, her right ABI is normal on her left is 0.89.  She has no obstructive disease for the most part on the right side with mild to moderate disease in her left SFA. ? ?She continues to do outpatient rehab/physical therapy.  She is walking much better but continues to complain of left calf claudication.  She wishes to proceed with outpatient peripheral angiography and endovascular therapy. ? ?I performed abdominal aortography with bifemoral runoff and endovascular therapy 07/13/2021.  I stented her left extrailiac artery and mid left SFA with excellent result.  She was discharged home the same day.  Follow-up Dopplers performed 07/20/2021 revealed a left ABI of 1.02 with a  patent stent in her external iliac artery and SFA.  She did appear to have slightly increased velocities in her distal common femoral but there was no disease there on angiography.  Her claudication has improved as well.  She wishes to go back to rehab.  She also has an umbilical hernia and hernia which apparently needs to be surgically fixed although I prefer her to have uninterrupted DAPT for at least 3 months. ? ?Since I saw her several months ago she has developed recurrent claudication on the left.  She did enjoy 3 to 4 months of improved claudication after her endovascular procedure.  Her repeat Doppler showed decline in her left ABI with recurrent disease in her left SFA.  She wishes to proceed with repeat angiography and intervention.  Unfortunately, she does continue to smoke. ? ? ?Current Meds  ?Medication Sig  ? albuterol (VENTOLIN HFA) 108 (90 Base) MCG/ACT inhaler Inhale 2 puffs into the lungs every 6 (six) hours as needed for shortness of breath or wheezing.  ? aspirin EC 81 MG EC tablet Take 1 tablet (81 mg total) by mouth daily. Swallow whole.  ? atorvastatin (LIPITOR) 80 MG tablet Take 1 tablet (80 mg total) by mouth daily. (Patient taking differently: Take 80 mg by mouth at bedtime.)  ? clopidogrel (PLAVIX) 75 MG tablet TAKE 1 TABLET EVERY DAY  ? cyclobenzaprine (FLEXERIL) 10 MG tablet Take 10 mg by mouth daily as needed for muscle spasms (Back pain).  ? diltiazem (TIAZAC) 180 MG 24  hr capsule Take 180 mg by mouth in the morning.  ? escitalopram (LEXAPRO) 20 MG tablet Take 20 mg by mouth in the morning.  ? famotidine (PEPCID) 40 MG tablet Take 40 mg by mouth at bedtime.  ? hydrOXYzine (ATARAX/VISTARIL) 50 MG tablet Take 50 mg by mouth 3 (three) times daily as needed for anxiety.  ? mirtazapine (REMERON) 30 MG tablet Take 0.5 tablets (15 mg total) by mouth at bedtime. (Patient taking differently: Take 30 mg by mouth at bedtime.)  ? Multiple Vitamin (MULTIVITAMIN WITH MINERALS) TABS tablet Take 1  tablet by mouth daily.  ? nicotine (NICODERM CQ - DOSED IN MG/24 HOURS) 21 mg/24hr patch Place 1 patch (21 mg total) onto the skin daily. (Patient taking differently: Place 21 mg onto the skin daily as needed.)  ? pantoprazole (PROTONIX) 40 MG tablet Take 1 tablet (40 mg total) by mouth daily. (Patient taking differently: Take 40 mg by mouth in the morning.)  ? thiamine 100 MG tablet Take 1 tablet (100 mg total) by mouth daily. (Patient taking differently: Take 100 mg by mouth in the morning.)  ? traMADol (ULTRAM) 50 MG tablet Take 1-2 tablets (50-100 mg total) by mouth every 6 (six) hours as needed for moderate pain.  ? ?Current Facility-Administered Medications for the 03/07/22 encounter (Office Visit) with Lorretta Harp, MD  ?Medication  ? sodium chloride flush (NS) 0.9 % injection 3 mL  ?  ? ?No Known Allergies ? ?Social History  ? ?Socioeconomic History  ? Marital status: Divorced  ?  Spouse name: Not on file  ? Number of children: 2  ? Years of education: Not on file  ? Highest education level: Not on file  ?Occupational History  ? Occupation: retired  ?Tobacco Use  ? Smoking status: Every Day  ?  Packs/day: 0.50  ?  Years: 40.00  ?  Pack years: 20.00  ?  Types: Cigarettes  ?  Start date: 11/12/1968  ? Smokeless tobacco: Never  ? Tobacco comments:  ?  cutting back, wearing patches  ?Vaping Use  ? Vaping Use: Never used  ?Substance and Sexual Activity  ? Alcohol use: Yes  ?  Alcohol/week: 0.0 standard drinks  ?  Comment: beer on weekends (6-pack)  ? Drug use: No  ? Sexual activity: Not Currently  ?Other Topics Concern  ? Not on file  ?Social History Narrative  ? Not on file  ? ?Social Determinants of Health  ? ?Financial Resource Strain: Not on file  ?Food Insecurity: Not on file  ?Transportation Needs: Not on file  ?Physical Activity: Not on file  ?Stress: Not on file  ?Social Connections: Not on file  ?Intimate Partner Violence: Not on file  ?  ? ?Review of Systems: ?General: negative for chills, fever,  night sweats or weight changes.  ?Cardiovascular: negative for chest pain, dyspnea on exertion, edema, orthopnea, palpitations, paroxysmal nocturnal dyspnea or shortness of breath ?Dermatological: negative for rash ?Respiratory: negative for cough or wheezing ?Urologic: negative for hematuria ?Abdominal: negative for nausea, vomiting, diarrhea, bright red blood per rectum, melena, or hematemesis ?Neurologic: negative for visual changes, syncope, or dizziness ?All other systems reviewed and are otherwise negative except as noted above. ? ? ? ?Blood pressure 120/72, pulse 95, height '5\' 6"'$  (1.676 m), weight 140 lb (63.5 kg).  ?General appearance: alert and no distress ?Neck: no adenopathy, no carotid bruit, no JVD, supple, symmetrical, trachea midline, and thyroid not enlarged, symmetric, no tenderness/mass/nodules ?Lungs: clear to auscultation bilaterally ?Heart: regular  rate and rhythm, S1, S2 normal, no murmur, click, rub or gallop ?Extremities: extremities normal, atraumatic, no cyanosis or edema ?Pulses: Diminished left pedal pulse ?Skin: Skin color, texture, turgor normal. No rashes or lesions ?Neurologic: Grossly normal ? ?EKG sinus rhythm at 95 with nonspecific ST and T wave changes.  I personally reviewed this EKG. ? ?ASSESSMENT AND PLAN:  ? ?Peripheral arterial disease ([place]) ?History of PAD s/p left extrailiac artery stenting as well as stenting of the mid left SFA with three-vessel runoff.  She enjoyed improvement in her claudication for several months but had slow recurrence of her symptoms.  Her most recent Doppler studies performed 02/12/2022 revealed a decline in her left ABI from 1.02-0.87.  Her iliac stent is patent.  She has recurrent disease in her left SFA.  She does continue to smoke.  She wishes to proceed with angiography and repeat endovascular therapy. ? ? ? ? ?Lorretta Harp MD FACP,FACC,FAHA, FSCAI ?03/07/2022 ?11:07 AM ?

## 2022-03-07 NOTE — Patient Instructions (Signed)
Medication Instructions:  ?Your physician recommends that you continue on your current medications as directed. Please refer to the Current Medication list given to you today. ? ?*If you need a refill on your cardiac medications before your next appointment, please call your pharmacy* ? ? ?Lab Work: ?Your physician recommends that you have labs drawn today: BMET & CBC ? ?If you have labs (blood work) drawn today and your tests are completely normal, you will receive your results only by: ?MyChart Message (if you have MyChart) OR ?A paper copy in the mail ?If you have any lab test that is abnormal or we need to change your treatment, we will call you to review the results. ? ? ?Testing/Procedures: ?Your physician has requested that you have a lower extremity arterial duplex. This test is an ultrasound of the arteries in the legs. It looks at arterial blood flow in the legs. Allow one hour for Lower Arterial scans. There are no restrictions or special instructions ? ?Your physician has requested that you have an ankle brachial index (ABI). During this test an ultrasound and blood pressure cuff are used to evaluate the arteries that supply the arms and legs with blood. Allow thirty minutes for this exam. There are no restrictions or special instructions. ?To be done 1-2 weeks after PV procedure (5/8). These will be done at [place] ? ? ?Follow-Up: ?At [facility], you and your health needs are our priority.  As part of our continuing mission to provide you with exceptional heart care, we have created designated Provider Care Teams.  These Care Teams include your primary Cardiologist (physician) and Advanced Practice Providers (APPs -  Physician Assistants and Nurse Practitioners) who all work together to provide you with the care you need, when you need it. ? ?We recommend signing up for the patient portal called "MyChart".  Sign up information is provided on this After Visit Summary.  MyChart is  used to connect with patients for Virtual Visits (Telemedicine).  Patients are able to view lab/test results, encounter notes, upcoming appointments, etc.  Non-urgent messages can be sent to your provider as well.   ?To learn more about what you can do with MyChart, go to [email or web address].   ? ?Your next appointment:   ?2-3 week(s) after PV procedure (5/8) ? ?The format for your next appointment:   ?In Person ? ?Provider:   ?Quay Burow, MD ? ? ?Other Instructions ? ?[place] ?[address] ?[address] ?[place] [address] ?Dept: [phone] ?Loc: [phone] ? ?Kara Bailey  03/07/2022 ? ?You are scheduled for a Peripheral Angiogram on Monday, May 8 with Dr. Quay Burow. ? ?1. Please arrive at the Main Entrance A at [facility]: [place], [place] [address] at 7:30 AM (This time is two hours before your procedure to ensure your preparation). Free valet parking service is available.  ? ?Special note: Every effort is made to have your procedure done on time. Please understand that emergencies sometimes delay scheduled procedures. ? ?2. Diet: Do not eat solid foods after midnight.  You may have clear liquids until 5 AM upon the day of the procedure. ? ?3. Labs: You will need to have blood drawn today. ? ? ?4. Medication instructions in preparation for your procedure: ? ? On the morning of your procedure, take Aspirin and Plavix/Clopidogrel and any morning medicines NOT listed above.  You may use sips of water. ? ?  5. Plan to go home the same day, you will only stay overnight if medically necessary. ?6. You MUST have a responsible adult to drive you home. ?7. An adult MUST be with you the first 24 hours after you arrive home. ?8. Bring a current list of your medications, and the last time and date medication taken. ?9. Bring ID and current insurance cards. ?10.Please wear clothes that are easy to get on  and off and wear slip-on shoes. ? ?Thank you for allowing [place] to care for you! ?  -- [facility] ?

## 2022-03-09 LAB — BASIC METABOLIC PANEL
BUN/Creatinine Ratio: 9 — ABNORMAL LOW (ref 12–28)
BUN: 9 mg/dL (ref 8–27)
CO2: 20 mmol/L (ref 20–29)
Calcium: 9.8 mg/dL (ref 8.7–10.3)
Chloride: 104 mmol/L (ref 96–106)
Creatinine, Ser: 0.99 mg/dL (ref 0.57–1.00)
Glucose: 103 mg/dL — ABNORMAL HIGH (ref 70–99)
Potassium: 5.1 mmol/L (ref 3.5–5.2)
Sodium: 139 mmol/L (ref 134–144)
eGFR: 62 mL/min/{1.73_m2} (ref >59)

## 2022-03-09 LAB — CBC
Hematocrit: 43.3 % (ref 34.0–46.6)
Hemoglobin: 14.5 g/dL (ref 11.1–15.9)
MCH: 29.7 pg (ref 26.6–33.0)
MCHC: 33.5 g/dL (ref 31.5–35.7)
MCV: 89 fL (ref 79–97)
Platelets: 263 10*3/uL (ref 150–450)
RBC: 4.88 x10E6/uL (ref 3.77–5.28)
RDW: 13.4 % (ref 11.7–15.4)
WBC: 7.2 10*3/uL (ref 3.4–10.8)

## 2022-03-12 ENCOUNTER — Telehealth: Payer: Self-pay | Admitting: Cardiovascular Disease

## 2022-03-12 NOTE — Telephone Encounter (Signed)
Pt called in and has  a couple questions about her procedure.  She is wanted to know if she needs to have someone stay with her.   ? ?Best number [phone] ?

## 2022-03-12 NOTE — Telephone Encounter (Signed)
Spoke with pt regarding having someone with her on the day of her procedure. Explained to pt that she can have someone with her and that person can choose to stay at the hospital during the procedure or can come and go as needed. Explained that pt can have someone spend the night with her if needed as well. Pt verbalizes understanding.  ?

## 2022-03-15 ENCOUNTER — Telehealth: Payer: Self-pay | Admitting: *Deleted

## 2022-03-15 NOTE — Telephone Encounter (Signed)
Abdominal aortogram scheduled at [facility] for: Monday Mar 19, 2022 9:30 AM ?Arrival time and place: [facility] at: 7:30 AM ? ? ?No solid food after midnight prior to cath, clear liquids until 5 AM day of procedure. ? ?Medication instructions: ?-Usual morning medications can be taken with sips of water including aspirin 81 mg and Plavix 75 mg. ? ?Confirmed patient has responsible adult to drive home post procedure and be with patient first 24 hours after arriving home. ? ?Patient reports no new symptoms concerning for COVID-19/no exposure to COVID-19 in the past 10 days. ? ?Reviewed procedure instructions with patient. ?

## 2022-03-19 ENCOUNTER — Encounter (HOSPITAL_COMMUNITY)
Admission: RE | Disposition: A | Discharge status: Home / Self Care | Payer: Self-pay | Source: Home / Self Care | Attending: Cardiovascular Disease

## 2022-03-19 ENCOUNTER — Other Ambulatory Visit: Payer: Self-pay

## 2022-03-19 ENCOUNTER — Ambulatory Visit (HOSPITAL_COMMUNITY)
Admission: RE | Admit: 2022-03-19 | Discharge: 2022-03-19 | Disposition: A | Discharge status: Home / Self Care | Payer: Medicare HMO | Attending: Cardiovascular Disease | Admitting: Cardiovascular Disease

## 2022-03-19 DIAGNOSIS — F17210 Nicotine dependence, cigarettes, uncomplicated: Secondary | ICD-10-CM | POA: Diagnosis not present

## 2022-03-19 DIAGNOSIS — I10 Essential (primary) hypertension: Secondary | ICD-10-CM | POA: Diagnosis not present

## 2022-03-19 DIAGNOSIS — Z8673 Personal history of transient ischemic attack (TIA), and cerebral infarction without residual deficits: Secondary | ICD-10-CM | POA: Insufficient documentation

## 2022-03-19 DIAGNOSIS — K429 Umbilical hernia without obstruction or gangrene: Secondary | ICD-10-CM | POA: Insufficient documentation

## 2022-03-19 DIAGNOSIS — I70212 Atherosclerosis of native arteries of extremities with intermittent claudication, left leg: Secondary | ICD-10-CM | POA: Diagnosis present

## 2022-03-19 DIAGNOSIS — I739 Peripheral vascular disease, unspecified: Secondary | ICD-10-CM

## 2022-03-19 HISTORY — PX: PERIPHERAL VASCULAR INTERVENTION: CATH118257

## 2022-03-19 HISTORY — PX: ABDOMINAL AORTOGRAM W/LOWER EXTREMITY: CATH118223

## 2022-03-19 LAB — POCT ACTIVATED CLOTTING TIME
Activated Clotting Time: 311 seconds
Activated Clotting Time: 335 seconds
Activated Clotting Time: 221 seconds
Activated Clotting Time: 185 seconds

## 2022-03-19 SURGERY — ABDOMINAL AORTOGRAM W/LOWER EXTREMITY
Anesthesia: LOCAL | Laterality: Right

## 2022-03-19 MED ORDER — SODIUM CHLORIDE 0.9 % IV SOLN: 250.0000 mL | INTRAVENOUS | Status: DC | PRN

## 2022-03-19 MED ORDER — LIDOCAINE HCL (PF) 1 % IJ SOLN
INTRAMUSCULAR | Status: AC
  Filled 2022-03-19: qty 30

## 2022-03-19 MED ORDER — FENTANYL CITRATE (PF) 100 MCG/2ML IJ SOLN
INTRAMUSCULAR | Status: DC | PRN
  Administered 2022-03-19: 25 ug via INTRAVENOUS

## 2022-03-19 MED ORDER — HYDRALAZINE HCL 20 MG/ML IJ SOLN: 5.0000 mg | INTRAMUSCULAR | Status: DC | PRN

## 2022-03-19 MED ORDER — LABETALOL HCL 5 MG/ML IV SOLN: 10.0000 mg | INTRAVENOUS | Status: DC | PRN

## 2022-03-19 MED ORDER — SODIUM CHLORIDE 0.9 % WEIGHT BASED INFUSION
3.0000 mL/kg/h | INTRAVENOUS | Status: AC
  Administered 2022-03-19: 3 mL/kg/h via INTRAVENOUS

## 2022-03-19 MED ORDER — MIDAZOLAM HCL 2 MG/2ML IJ SOLN
INTRAMUSCULAR | Status: AC
  Filled 2022-03-19: qty 2

## 2022-03-19 MED ORDER — ASPIRIN 81 MG PO CHEW: 81.0000 mg | CHEWABLE_TABLET | ORAL | Status: DC

## 2022-03-19 MED ORDER — CLOPIDOGREL BISULFATE 300 MG PO TABS
ORAL_TABLET | ORAL | Status: DC | PRN
  Administered 2022-03-19: 300 mg via ORAL

## 2022-03-19 MED ORDER — FENTANYL CITRATE (PF) 100 MCG/2ML IJ SOLN
INTRAMUSCULAR | Status: AC
  Filled 2022-03-19: qty 2

## 2022-03-19 MED ORDER — SODIUM CHLORIDE 0.9 % IV SOLN: INTRAVENOUS | Status: AC

## 2022-03-19 MED ORDER — SODIUM CHLORIDE 0.9% FLUSH: 3.0000 mL | INTRAVENOUS | Status: DC | PRN

## 2022-03-19 MED ORDER — SODIUM CHLORIDE 0.9% FLUSH: 3.0000 mL | Freq: Two times a day (BID) | INTRAVENOUS | Status: DC

## 2022-03-19 MED ORDER — HEPARIN (PORCINE) IN NACL 1000-0.9 UT/500ML-% IV SOLN
INTRAVENOUS | Status: DC | PRN
  Administered 2022-03-19 (×2): 500 mL

## 2022-03-19 MED ORDER — ONDANSETRON HCL 4 MG/2ML IJ SOLN
4.0000 mg | Freq: Four times a day (QID) | INTRAMUSCULAR | Status: DC | PRN
Start: 2022-03-19 — End: 2022-03-20

## 2022-03-19 MED ORDER — CLOPIDOGREL BISULFATE 75 MG PO TABS: 75.0000 mg | ORAL_TABLET | Freq: Every day | ORAL | Status: DC

## 2022-03-19 MED ORDER — HEPARIN SODIUM (PORCINE) 1000 UNIT/ML IJ SOLN
INTRAMUSCULAR | Status: DC | PRN
  Administered 2022-03-19: 7000 [IU] via INTRAVENOUS

## 2022-03-19 MED ORDER — IODIXANOL 320 MG/ML IV SOLN
INTRAVENOUS | Status: DC | PRN
  Administered 2022-03-19: 125 mL

## 2022-03-19 MED ORDER — ASPIRIN EC 81 MG PO TBEC: 81.0000 mg | DELAYED_RELEASE_TABLET | Freq: Every day | ORAL | Status: DC

## 2022-03-19 MED ORDER — MIDAZOLAM HCL 2 MG/2ML IJ SOLN
INTRAMUSCULAR | Status: DC | PRN
  Administered 2022-03-19: 1 mg via INTRAVENOUS

## 2022-03-19 MED ORDER — CLOPIDOGREL BISULFATE 300 MG PO TABS
ORAL_TABLET | ORAL | Status: AC
  Filled 2022-03-19: qty 1

## 2022-03-19 MED ORDER — HEPARIN (PORCINE) IN NACL 1000-0.9 UT/500ML-% IV SOLN
INTRAVENOUS | Status: AC
  Filled 2022-03-19: qty 1000

## 2022-03-19 MED ORDER — MORPHINE SULFATE (PF) 2 MG/ML IV SOLN: 2.0000 mg | INTRAVENOUS | Status: DC | PRN

## 2022-03-19 MED ORDER — ACETAMINOPHEN 325 MG PO TABS: 650.0000 mg | ORAL_TABLET | ORAL | Status: DC | PRN

## 2022-03-19 MED ORDER — LIDOCAINE HCL (PF) 1 % IJ SOLN
INTRAMUSCULAR | Status: DC | PRN
  Administered 2022-03-19: 25 mL

## 2022-03-19 MED ORDER — SODIUM CHLORIDE 0.9 % WEIGHT BASED INFUSION: 1.0000 mL/kg/h | INTRAVENOUS | Status: DC

## 2022-03-19 MED ORDER — HEPARIN SODIUM (PORCINE) 1000 UNIT/ML IJ SOLN
INTRAMUSCULAR | Status: AC
  Filled 2022-03-19: qty 10

## 2022-03-19 SURGICAL SUPPLY — 21 items
BALLN STERLING OTW 4X150X150 (BALLOONS) ×3
BALLN STERLING OTW 5X150X150 (BALLOONS) ×3
BALLOON STERLING OTW 4X150X150 (BALLOONS) IMPLANT
BALLOON STERLING OTW 5X150X150 (BALLOONS) IMPLANT
CATH ANGIO 5F PIGTAIL 65CM (CATHETERS) ×1 IMPLANT
CATH CROSS OVER TEMPO 5F (CATHETERS) ×1 IMPLANT
CATH STRAIGHT 5FR 65CM (CATHETERS) ×1 IMPLANT
KIT ENCORE 26 ADVANTAGE (KITS) ×1 IMPLANT
KIT PV (KITS) ×4 IMPLANT
SHEATH FLEX ANSEL ANG 6F 45CM (SHEATH) ×1 IMPLANT
SHEATH PINNACLE 5F 10CM (SHEATH) ×1 IMPLANT
SHEATH PINNACLE 6F 10CM (SHEATH) ×1 IMPLANT
STENT ELUVIA 6X150X130 (Permanent Stent) ×1 IMPLANT
STOPCOCK MORSE 400PSI 3WAY (MISCELLANEOUS) ×1 IMPLANT
SYR MEDRAD MARK 7 150ML (SYRINGE) ×4 IMPLANT
TAPE SHOOT N SEE (TAPE) ×1 IMPLANT
TRANSDUCER W/STOPCOCK (MISCELLANEOUS) ×4 IMPLANT
TRAY PV CATH (CUSTOM PROCEDURE TRAY) ×4 IMPLANT
TUBING CIL FLEX 10 FLL-RA (TUBING) ×1 IMPLANT
WIRE HITORQ VERSACORE ST 145CM (WIRE) ×1 IMPLANT
WIRE SHEPHERD 6G .018 (WIRE) ×1 IMPLANT

## 2022-03-19 NOTE — Interval H&P Note (Signed)
History and Physical Interval Note: ? ?03/19/2022 ?10:13 AM ? ?Kara Bailey  has presented today for surgery, with the diagnosis of pad.  The various methods of treatment have been discussed with the patient and family. After consideration of risks, benefits and other options for treatment, the patient has consented to  Procedure(s): ?ABDOMINAL AORTOGRAM W/LOWER EXTREMITY (N/A) as a surgical intervention.  The patient's history has been reviewed, patient examined, no change in status, stable for surgery.  I have reviewed the patient's chart and labs.  Questions were answered to the patient's satisfaction.   ? ? ?Quay Burow ? ? ?

## 2022-03-19 NOTE — Progress Notes (Signed)
Site area: right groin ? ?Site Prior to Removal:  Level 0 ? ?Pressure Applied For 20 MINUTES   ? ?Minutes Beginning at 1423 ? ?Manual:   Yes.   ? ?Patient Status During Pull:  Stable ? ?Post Pull Groin Site:  Level 0 ? ?Post Pull Instructions Given:  Yes.   ? ?Post Pull Pulses Present:  Yes.   ? ?Dressing Applied:  Yes.   ? ?Comments:  Bed rest started at 1445 X 6 hr.  ?

## 2022-03-20 ENCOUNTER — Telehealth: Payer: Self-pay | Admitting: Cardiovascular Disease

## 2022-03-20 ENCOUNTER — Encounter (HOSPITAL_COMMUNITY): Payer: Self-pay | Admitting: Cardiovascular Disease

## 2022-03-20 NOTE — Telephone Encounter (Signed)
Calling back to say that she has extreme pain in her legs. Please advise  ?

## 2022-03-20 NOTE — Telephone Encounter (Signed)
Called patient back. Had PV procedure yesterday- patient is complaining that her left leg is causing her extreme pain. It is a 10 on the pain scale if she tries to walk on it. Patient denies redness, bleeding, heat, or any increase swelling from that leg other than complaints of pain when walking or laying a certain way. ? ?Patient advised of ED precautions, however I would send to MD to review.  ?

## 2022-03-21 NOTE — Telephone Encounter (Signed)
Attempted to contact patient, LVM to cal back to discuss further.  ? ?

## 2022-03-27 NOTE — Telephone Encounter (Signed)
Called patient, she states that her leg feels better now- she is aware to keep her upcoming appointments for scans and with Dr.Berry.  ?Thanks! ?

## 2022-04-02 ENCOUNTER — Other Ambulatory Visit (HOSPITAL_COMMUNITY): Payer: Self-pay | Admitting: Cardiovascular Disease

## 2022-04-02 ENCOUNTER — Ambulatory Visit (HOSPITAL_COMMUNITY)
Admission: RE | Admit: 2022-04-02 | Discharge: 2022-04-02 | Disposition: A | Discharge status: Home / Self Care | Payer: Medicare HMO | Source: Ambulatory Visit | Attending: Internal Medicine | Admitting: Internal Medicine

## 2022-04-02 DIAGNOSIS — Z95828 Presence of other vascular implants and grafts: Secondary | ICD-10-CM

## 2022-04-02 DIAGNOSIS — I739 Peripheral vascular disease, unspecified: Secondary | ICD-10-CM

## 2022-04-02 DIAGNOSIS — Z95820 Peripheral vascular angioplasty status with implants and grafts: Secondary | ICD-10-CM

## 2022-04-06 ENCOUNTER — Ambulatory Visit: Payer: Medicare HMO | Admitting: Cardiovascular Disease

## 2022-04-18 ENCOUNTER — Ambulatory Visit: Payer: Medicare HMO | Admitting: Adult Health

## 2022-05-04 ENCOUNTER — Ambulatory Visit: Payer: Medicare HMO | Admitting: Cardiovascular Disease

## 2022-05-09 ENCOUNTER — Encounter: Payer: Self-pay | Admitting: Cardiovascular Disease

## 2022-05-28 ENCOUNTER — Encounter (HOSPITAL_COMMUNITY): Payer: Self-pay | Admitting: Emergency Medicine

## 2022-05-28 ENCOUNTER — Other Ambulatory Visit: Payer: Self-pay

## 2022-05-28 ENCOUNTER — Emergency Department (HOSPITAL_COMMUNITY): Payer: Medicare HMO

## 2022-05-28 ENCOUNTER — Emergency Department (HOSPITAL_COMMUNITY)
Admission: EM | Admit: 2022-05-28 | Discharge: 2022-05-28 | Disposition: A | Discharge status: Home / Self Care | Payer: Medicare HMO | Attending: Student | Admitting: Student

## 2022-05-28 DIAGNOSIS — I517 Cardiomegaly: Secondary | ICD-10-CM | POA: Diagnosis not present

## 2022-05-28 DIAGNOSIS — Z8673 Personal history of transient ischemic attack (TIA), and cerebral infarction without residual deficits: Secondary | ICD-10-CM | POA: Insufficient documentation

## 2022-05-28 DIAGNOSIS — G9389 Other specified disorders of brain: Secondary | ICD-10-CM | POA: Diagnosis not present

## 2022-05-28 DIAGNOSIS — R9431 Abnormal electrocardiogram [ECG] [EKG]: Secondary | ICD-10-CM | POA: Insufficient documentation

## 2022-05-28 DIAGNOSIS — Z5321 Procedure and treatment not carried out due to patient leaving prior to being seen by health care provider: Secondary | ICD-10-CM | POA: Diagnosis not present

## 2022-05-28 DIAGNOSIS — R4701 Aphasia: Secondary | ICD-10-CM | POA: Diagnosis present

## 2022-05-28 DIAGNOSIS — Z20822 Contact with and (suspected) exposure to covid-19: Secondary | ICD-10-CM | POA: Insufficient documentation

## 2022-05-28 LAB — RAPID URINE DRUG SCREEN, HOSP PERFORMED
Amphetamines: NOT DETECTED
Barbiturates: NOT DETECTED
Benzodiazepines: NOT DETECTED
Cocaine: NOT DETECTED
Opiates: NOT DETECTED
Tetrahydrocannabinol: NOT DETECTED

## 2022-05-28 LAB — COMPREHENSIVE METABOLIC PANEL
ALT: 16 U/L (ref 0–44)
AST: 17 U/L (ref 15–41)
Albumin: 4 g/dL (ref 3.5–5.0)
Alkaline Phosphatase: 85 U/L (ref 38–126)
Anion gap: 11 (ref 5–15)
BUN: 11 mg/dL (ref 8–23)
CO2: 21 mmol/L — ABNORMAL LOW (ref 22–32)
Calcium: 9.2 mg/dL (ref 8.9–10.3)
Chloride: 106 mmol/L (ref 98–111)
Creatinine, Ser: 1.02 mg/dL — ABNORMAL HIGH (ref 0.44–1.00)
GFR, Estimated: 60 mL/min — ABNORMAL LOW (ref >60)
Glucose, Bld: 97 mg/dL (ref 70–99)
Potassium: 4.7 mmol/L (ref 3.5–5.1)
Sodium: 138 mmol/L (ref 135–145)
Total Bilirubin: 0.5 mg/dL (ref 0.3–1.2)
Total Protein: 7 g/dL (ref 6.5–8.1)

## 2022-05-28 LAB — I-STAT CHEM 8, ED
BUN: 13 mg/dL (ref 8–23)
Calcium, Ion: 1 mmol/L — ABNORMAL LOW (ref 1.15–1.40)
Chloride: 109 mmol/L (ref 98–111)
Creatinine, Ser: 0.9 mg/dL (ref 0.44–1.00)
Glucose, Bld: 96 mg/dL (ref 70–99)
HCT: 44 % (ref 36.0–46.0)
Hemoglobin: 15 g/dL (ref 12.0–15.0)
Potassium: 4.6 mmol/L (ref 3.5–5.1)
Sodium: 139 mmol/L (ref 135–145)
TCO2: 21 mmol/L — ABNORMAL LOW (ref 22–32)

## 2022-05-28 LAB — DIFFERENTIAL
Abs Immature Granulocytes: 0.04 10*3/uL (ref 0.00–0.07)
Basophils Absolute: 0 10*3/uL (ref 0.0–0.1)
Basophils Relative: 0 %
Eosinophils Absolute: 0.1 10*3/uL (ref 0.0–0.5)
Eosinophils Relative: 2 %
Immature Granulocytes: 1 %
Lymphocytes Relative: 25 %
Lymphs Abs: 2 10*3/uL (ref 0.7–4.0)
Monocytes Absolute: 0.6 10*3/uL (ref 0.1–1.0)
Monocytes Relative: 8 %
Neutro Abs: 5.1 10*3/uL (ref 1.7–7.7)
Neutrophils Relative %: 64 %

## 2022-05-28 LAB — CBC
HCT: 45 % (ref 36.0–46.0)
Hemoglobin: 14 g/dL (ref 12.0–15.0)
MCH: 29.8 pg (ref 26.0–34.0)
MCHC: 31.1 g/dL (ref 30.0–36.0)
MCV: 95.7 fL (ref 80.0–100.0)
Platelets: 279 10*3/uL (ref 150–400)
RBC: 4.7 MIL/uL (ref 3.87–5.11)
RDW: 15.3 % (ref 11.5–15.5)
WBC: 7.8 10*3/uL (ref 4.0–10.5)
nRBC: 0 % (ref 0.0–0.2)

## 2022-05-28 LAB — URINALYSIS, ROUTINE W REFLEX MICROSCOPIC
Bilirubin Urine: NEGATIVE
Glucose, UA: NEGATIVE mg/dL
Hgb urine dipstick: NEGATIVE
Ketones, ur: NEGATIVE mg/dL
Leukocytes,Ua: NEGATIVE
Nitrite: NEGATIVE
Protein, ur: NEGATIVE mg/dL
Specific Gravity, Urine: 1.011 (ref 1.005–1.030)
pH: 5 (ref 5.0–8.0)

## 2022-05-28 LAB — RESP PANEL BY RT-PCR (FLU A&B, COVID) ARPGX2
Influenza A by PCR: NEGATIVE
Influenza B by PCR: NEGATIVE
SARS Coronavirus 2 by RT PCR: NEGATIVE

## 2022-05-28 LAB — PROTIME-INR
INR: 1 (ref 0.8–1.2)
Prothrombin Time: 13 seconds (ref 11.4–15.2)

## 2022-05-28 LAB — ETHANOL: Alcohol, Ethyl (B): <10 mg/dL (ref <10)

## 2022-05-28 LAB — APTT: aPTT: 27 seconds (ref 24–36)

## 2022-05-28 NOTE — ED Triage Notes (Signed)
Pt arrives via EMS - went to MD office today for normal check up. Pt had a stroke back in feb 2022. Pt had some aphasia from it. It improved with therapy. but over the past month her speech has worsened. Pt having involuntary strange movements of her right hand. Pt's MD reported that she had right sided facial droop, however EMS does not see this. Pt denies headache. Pt's MD sent her here due to concerns with these worsening symptoms over the past month. BP 180/110, HR 104, HR 18, CBG 118

## 2022-05-28 NOTE — ED Notes (Signed)
Patient stated she was going to get results from my chart and go to primary care doctor

## 2022-05-28 NOTE — ED Provider Triage Note (Signed)
Emergency Medicine Provider Triage Evaluation Note  Kara Bailey , a 66 y.o. female  was evaluated in triage.  Pt complains of stroke like symptoms.  Patient sent here by PCP for concern of new stroke. Apparently over the past month, patient has been having worsening speech, involuntary movements of the right hand, and right sided facial droop.  Hx of CVA in February of 2023. Per patient she has only really noticed problems with word finding difficulties and slow walking. She isn't sure that she has had the left arm tremors. Denies balance issues, weakness, numbness, headaches, chest pain, shortness of breath.   Review of Systems  Positive:   Negative:   Physical Exam  There were no vitals taken for this visit. Gen:   Awake, no distress   Resp:  Normal effort  MSK:   Moves extremities without difficulty  Other:  Decreased sensation on right face and upper ext. Moving all ext equally. No facial droop. PERRLA. EOM intact.  Some expressive aphasia evident.   Medical Decision Making  Medically screening exam initiated at 11:51 AM.  Appropriate orders placed.  Kara Bailey was informed that the remainder of the evaluation will be completed by another provider, this initial triage assessment does not replace that evaluation, and the importance of remaining in the ED until their evaluation is complete.     Adolphus Birchwood, PA-C 05/28/22 1159

## 2022-07-23 ENCOUNTER — Other Ambulatory Visit: Payer: Self-pay | Admitting: Cardiovascular Disease

## 2022-07-26 ENCOUNTER — Observation Stay (HOSPITAL_COMMUNITY): Payer: Medicare HMO

## 2022-07-26 ENCOUNTER — Emergency Department (HOSPITAL_COMMUNITY): Payer: Medicare HMO

## 2022-07-26 ENCOUNTER — Encounter (HOSPITAL_COMMUNITY): Payer: Self-pay | Admitting: Internal Medicine

## 2022-07-26 ENCOUNTER — Other Ambulatory Visit: Payer: Self-pay

## 2022-07-26 ENCOUNTER — Inpatient Hospital Stay (HOSPITAL_COMMUNITY)
Admission: EM | Admit: 2022-07-26 | Discharge: 2022-08-01 | DRG: 065 | Disposition: A | Discharge status: Rehabilitation Facility | Payer: Medicare HMO | Attending: Internal Medicine | Admitting: Internal Medicine

## 2022-07-26 DIAGNOSIS — F17210 Nicotine dependence, cigarettes, uncomplicated: Secondary | ICD-10-CM | POA: Diagnosis present

## 2022-07-26 DIAGNOSIS — Z823 Family history of stroke: Secondary | ICD-10-CM

## 2022-07-26 DIAGNOSIS — F32A Depression, unspecified: Secondary | ICD-10-CM | POA: Diagnosis present

## 2022-07-26 DIAGNOSIS — I739 Peripheral vascular disease, unspecified: Secondary | ICD-10-CM | POA: Diagnosis present

## 2022-07-26 DIAGNOSIS — E785 Hyperlipidemia, unspecified: Secondary | ICD-10-CM | POA: Diagnosis present

## 2022-07-26 DIAGNOSIS — R1313 Dysphagia, pharyngeal phase: Secondary | ICD-10-CM | POA: Diagnosis present

## 2022-07-26 DIAGNOSIS — R29810 Facial weakness: Secondary | ICD-10-CM | POA: Diagnosis present

## 2022-07-26 DIAGNOSIS — I639 Cerebral infarction, unspecified: Secondary | ICD-10-CM | POA: Diagnosis present

## 2022-07-26 DIAGNOSIS — I6389 Other cerebral infarction: Secondary | ICD-10-CM

## 2022-07-26 DIAGNOSIS — Z7902 Long term (current) use of antithrombotics/antiplatelets: Secondary | ICD-10-CM

## 2022-07-26 DIAGNOSIS — I672 Cerebral atherosclerosis: Secondary | ICD-10-CM | POA: Diagnosis present

## 2022-07-26 DIAGNOSIS — K5900 Constipation, unspecified: Secondary | ICD-10-CM | POA: Diagnosis present

## 2022-07-26 DIAGNOSIS — Z8249 Family history of ischemic heart disease and other diseases of the circulatory system: Secondary | ICD-10-CM

## 2022-07-26 DIAGNOSIS — I10 Essential (primary) hypertension: Secondary | ICD-10-CM | POA: Diagnosis present

## 2022-07-26 DIAGNOSIS — Z79899 Other long term (current) drug therapy: Secondary | ICD-10-CM

## 2022-07-26 DIAGNOSIS — G8194 Hemiplegia, unspecified affecting left nondominant side: Secondary | ICD-10-CM | POA: Diagnosis not present

## 2022-07-26 DIAGNOSIS — K7460 Unspecified cirrhosis of liver: Secondary | ICD-10-CM | POA: Diagnosis present

## 2022-07-26 DIAGNOSIS — I6381 Other cerebral infarction due to occlusion or stenosis of small artery: Principal | ICD-10-CM | POA: Diagnosis present

## 2022-07-26 DIAGNOSIS — K219 Gastro-esophageal reflux disease without esophagitis: Secondary | ICD-10-CM | POA: Diagnosis present

## 2022-07-26 DIAGNOSIS — J439 Emphysema, unspecified: Secondary | ICD-10-CM | POA: Diagnosis present

## 2022-07-26 DIAGNOSIS — R4701 Aphasia: Secondary | ICD-10-CM | POA: Diagnosis present

## 2022-07-26 DIAGNOSIS — R471 Dysarthria and anarthria: Secondary | ICD-10-CM | POA: Diagnosis present

## 2022-07-26 DIAGNOSIS — F17200 Nicotine dependence, unspecified, uncomplicated: Secondary | ICD-10-CM

## 2022-07-26 DIAGNOSIS — R29711 NIHSS score 11: Secondary | ICD-10-CM | POA: Diagnosis present

## 2022-07-26 DIAGNOSIS — Z7982 Long term (current) use of aspirin: Secondary | ICD-10-CM

## 2022-07-26 DIAGNOSIS — Z8673 Personal history of transient ischemic attack (TIA), and cerebral infarction without residual deficits: Secondary | ICD-10-CM

## 2022-07-26 DIAGNOSIS — F419 Anxiety disorder, unspecified: Secondary | ICD-10-CM | POA: Diagnosis present

## 2022-07-26 DIAGNOSIS — Z83438 Family history of other disorder of lipoprotein metabolism and other lipidemia: Secondary | ICD-10-CM

## 2022-07-26 LAB — DIFFERENTIAL
Abs Immature Granulocytes: 0.03 10*3/uL (ref 0.00–0.07)
Basophils Absolute: 0 10*3/uL (ref 0.0–0.1)
Basophils Relative: 0 %
Eosinophils Absolute: 0.2 10*3/uL (ref 0.0–0.5)
Eosinophils Relative: 1 %
Immature Granulocytes: 0 %
Lymphocytes Relative: 17 %
Lymphs Abs: 1.9 10*3/uL (ref 0.7–4.0)
Monocytes Absolute: 1 10*3/uL (ref 0.1–1.0)
Monocytes Relative: 9 %
Neutro Abs: 8.1 10*3/uL — ABNORMAL HIGH (ref 1.7–7.7)
Neutrophils Relative %: 73 %

## 2022-07-26 LAB — COMPREHENSIVE METABOLIC PANEL
ALT: 13 U/L (ref 0–44)
AST: 18 U/L (ref 15–41)
Albumin: 3.8 g/dL (ref 3.5–5.0)
Alkaline Phosphatase: 84 U/L (ref 38–126)
Anion gap: 8 (ref 5–15)
BUN: 12 mg/dL (ref 8–23)
CO2: 23 mmol/L (ref 22–32)
Calcium: 9.7 mg/dL (ref 8.9–10.3)
Chloride: 110 mmol/L (ref 98–111)
Creatinine, Ser: 1.18 mg/dL — ABNORMAL HIGH (ref 0.44–1.00)
GFR, Estimated: 50 mL/min — ABNORMAL LOW (ref >60)
Glucose, Bld: 102 mg/dL — ABNORMAL HIGH (ref 70–99)
Potassium: 3.6 mmol/L (ref 3.5–5.1)
Sodium: 141 mmol/L (ref 135–145)
Total Bilirubin: 0.5 mg/dL (ref 0.3–1.2)
Total Protein: 7.1 g/dL (ref 6.5–8.1)

## 2022-07-26 LAB — LIPID PANEL
Cholesterol: 185 mg/dL (ref 0–200)
HDL: 41 mg/dL (ref >40)
LDL Cholesterol: 125 mg/dL — ABNORMAL HIGH (ref 0–99)
Total CHOL/HDL Ratio: 4.5 RATIO
Triglycerides: 97 mg/dL (ref <150)
VLDL: 19 mg/dL (ref 0–40)

## 2022-07-26 LAB — HEMOGLOBIN A1C
Hgb A1c MFr Bld: 5.4 % (ref 4.8–5.6)
Mean Plasma Glucose: 108.28 mg/dL

## 2022-07-26 LAB — ECHOCARDIOGRAM COMPLETE
AR max vel: 2.41 cm2
AV Area VTI: 2.23 cm2
AV Area mean vel: 2.18 cm2
AV Mean grad: 8 mmHg
AV Peak grad: 11.8 mmHg
Ao pk vel: 1.72 m/s
Area-P 1/2: 2.92 cm2
Height: 66 in
S' Lateral: 2 cm
Weight: 2240 oz

## 2022-07-26 LAB — APTT: aPTT: 27 seconds (ref 24–36)

## 2022-07-26 LAB — I-STAT CHEM 8, ED
BUN: 13 mg/dL (ref 8–23)
Calcium, Ion: 1.19 mmol/L (ref 1.15–1.40)
Chloride: 108 mmol/L (ref 98–111)
Creatinine, Ser: 1 mg/dL (ref 0.44–1.00)
Glucose, Bld: 101 mg/dL — ABNORMAL HIGH (ref 70–99)
HCT: 41 % (ref 36.0–46.0)
Hemoglobin: 13.9 g/dL (ref 12.0–15.0)
Potassium: 3.6 mmol/L (ref 3.5–5.1)
Sodium: 142 mmol/L (ref 135–145)
TCO2: 24 mmol/L (ref 22–32)

## 2022-07-26 LAB — PROTIME-INR
INR: 1 (ref 0.8–1.2)
Prothrombin Time: 12.9 seconds (ref 11.4–15.2)

## 2022-07-26 LAB — ETHANOL: Alcohol, Ethyl (B): <10 mg/dL (ref <10)

## 2022-07-26 LAB — CBG MONITORING, ED
Glucose-Capillary: 104 mg/dL — ABNORMAL HIGH (ref 70–99)
Glucose-Capillary: 107 mg/dL — ABNORMAL HIGH (ref 70–99)

## 2022-07-26 LAB — CBC
HCT: 41.5 % (ref 36.0–46.0)
Hemoglobin: 13.7 g/dL (ref 12.0–15.0)
MCH: 30.4 pg (ref 26.0–34.0)
MCHC: 33 g/dL (ref 30.0–36.0)
MCV: 92 fL (ref 80.0–100.0)
Platelets: 247 10*3/uL (ref 150–400)
RBC: 4.51 MIL/uL (ref 3.87–5.11)
RDW: 14.7 % (ref 11.5–15.5)
WBC: 11.2 10*3/uL — ABNORMAL HIGH (ref 4.0–10.5)
nRBC: 0 % (ref 0.0–0.2)

## 2022-07-26 LAB — GLUCOSE, CAPILLARY: Glucose-Capillary: 100 mg/dL — ABNORMAL HIGH (ref 70–99)

## 2022-07-26 MED ORDER — IOHEXOL 350 MG/ML SOLN
75.0000 mL | Freq: Once | INTRAVENOUS | Status: AC | PRN
  Administered 2022-07-26: 75 mL via INTRAVENOUS

## 2022-07-26 MED ORDER — SODIUM CHLORIDE 0.9 % IV SOLN: INTRAVENOUS | Status: AC

## 2022-07-26 MED ORDER — ASPIRIN 81 MG PO CHEW
81.0000 mg | CHEWABLE_TABLET | Freq: Every day | ORAL | Status: DC
  Administered 2022-07-26 – 2022-08-01 (×7): 81 mg via ORAL
  Filled 2022-07-26 (×7): qty 1

## 2022-07-26 MED ORDER — ENOXAPARIN SODIUM 40 MG/0.4ML IJ SOSY
40.0000 mg | PREFILLED_SYRINGE | INTRAMUSCULAR | Status: DC
  Administered 2022-07-26 – 2022-08-01 (×7): 40 mg via SUBCUTANEOUS
  Filled 2022-07-26 (×7): qty 0.4

## 2022-07-26 MED ORDER — MIRTAZAPINE 15 MG PO TABS: 30.0000 mg | ORAL_TABLET | Freq: Every day | ORAL | Status: DC

## 2022-07-26 MED ORDER — FAMOTIDINE IN NACL 20-0.9 MG/50ML-% IV SOLN
20.0000 mg | INTRAVENOUS | Status: DC
  Administered 2022-07-26 – 2022-07-27 (×2): 20 mg via INTRAVENOUS
  Filled 2022-07-26 (×2): qty 50

## 2022-07-26 MED ORDER — NICOTINE 14 MG/24HR TD PT24: 14.0000 mg | MEDICATED_PATCH | Freq: Every day | TRANSDERMAL | Status: DC | PRN

## 2022-07-26 MED ORDER — ATORVASTATIN CALCIUM 80 MG PO TABS: 80.0000 mg | ORAL_TABLET | Freq: Every day | ORAL | Status: DC

## 2022-07-26 MED ORDER — ACETAMINOPHEN 650 MG RE SUPP: 650.0000 mg | RECTAL | Status: DC | PRN

## 2022-07-26 MED ORDER — FAMOTIDINE 20 MG PO TABS
40.0000 mg | ORAL_TABLET | Freq: Every day | ORAL | Status: DC
  Filled 2022-07-26: qty 2

## 2022-07-26 MED ORDER — ACETAMINOPHEN 325 MG PO TABS
650.0000 mg | ORAL_TABLET | ORAL | Status: DC | PRN
  Administered 2022-07-30: 650 mg via ORAL
  Filled 2022-07-26: qty 2

## 2022-07-26 MED ORDER — PANTOPRAZOLE SODIUM 40 MG PO TBEC: 40.0000 mg | DELAYED_RELEASE_TABLET | Freq: Every day | ORAL | Status: DC

## 2022-07-26 MED ORDER — CLOPIDOGREL BISULFATE 75 MG PO TABS: 75.0000 mg | ORAL_TABLET | Freq: Every day | ORAL | Status: DC

## 2022-07-26 MED ORDER — PERFLUTREN LIPID MICROSPHERE
1.0000 mL | INTRAVENOUS | Status: AC | PRN
  Administered 2022-07-26: 2 mL via INTRAVENOUS

## 2022-07-26 MED ORDER — BUPROPION HCL ER (SR) 150 MG PO TB12
150.0000 mg | ORAL_TABLET | Freq: Every morning | ORAL | Status: DC
  Administered 2022-07-28 – 2022-08-01 (×5): 150 mg via ORAL
  Filled 2022-07-26 (×7): qty 1

## 2022-07-26 MED ORDER — ASPIRIN 300 MG RE SUPP: 300.0000 mg | Freq: Every day | RECTAL | Status: DC

## 2022-07-26 MED ORDER — ASPIRIN 81 MG PO TBEC: 81.0000 mg | DELAYED_RELEASE_TABLET | Freq: Every day | ORAL | Status: DC

## 2022-07-26 MED ORDER — PANTOPRAZOLE SODIUM 40 MG IV SOLR
40.0000 mg | INTRAVENOUS | Status: DC
  Administered 2022-07-27 – 2022-07-30 (×4): 40 mg via INTRAVENOUS
  Filled 2022-07-26 (×4): qty 10

## 2022-07-26 MED ORDER — ATORVASTATIN CALCIUM 80 MG PO TABS
80.0000 mg | ORAL_TABLET | Freq: Every day | ORAL | Status: DC
  Administered 2022-07-26 – 2022-08-01 (×7): 80 mg via ORAL
  Filled 2022-07-26 (×7): qty 1

## 2022-07-26 MED ORDER — HEPARIN SODIUM (PORCINE) 5000 UNIT/ML IJ SOLN: 5000.0000 [IU] | Freq: Three times a day (TID) | INTRAMUSCULAR | Status: DC

## 2022-07-26 MED ORDER — PANTOPRAZOLE SODIUM 40 MG PO TBEC
40.0000 mg | DELAYED_RELEASE_TABLET | Freq: Every day | ORAL | Status: DC
  Administered 2022-07-26: 40 mg via ORAL
  Filled 2022-07-26: qty 1

## 2022-07-26 MED ORDER — EZETIMIBE 10 MG PO TABS
10.0000 mg | ORAL_TABLET | Freq: Every day | ORAL | Status: DC
  Administered 2022-07-26 – 2022-08-01 (×7): 10 mg via ORAL
  Filled 2022-07-26 (×7): qty 1

## 2022-07-26 MED ORDER — MIRTAZAPINE 15 MG PO TABS
30.0000 mg | ORAL_TABLET | Freq: Every day | ORAL | Status: DC
  Administered 2022-07-27 – 2022-07-31 (×5): 30 mg via ORAL
  Filled 2022-07-26 (×6): qty 2

## 2022-07-26 MED ORDER — ACETAMINOPHEN 160 MG/5ML PO SOLN: 650.0000 mg | ORAL | Status: DC | PRN

## 2022-07-26 MED ORDER — ROSUVASTATIN CALCIUM 20 MG PO TABS: 40.0000 mg | ORAL_TABLET | Freq: Every day | ORAL | Status: DC

## 2022-07-26 MED ORDER — BUPROPION HCL ER (SR) 150 MG PO TB12: 150.0000 mg | ORAL_TABLET | Freq: Two times a day (BID) | ORAL | Status: DC

## 2022-07-26 MED ORDER — BUPROPION HCL ER (SR) 150 MG PO TB12: 150.0000 mg | ORAL_TABLET | Freq: Every day | ORAL | Status: DC

## 2022-07-26 MED ORDER — CLOPIDOGREL BISULFATE 75 MG PO TABS
75.0000 mg | ORAL_TABLET | Freq: Every day | ORAL | Status: DC
  Administered 2022-07-26 – 2022-08-01 (×7): 75 mg via ORAL
  Filled 2022-07-26 (×7): qty 1

## 2022-07-26 MED ORDER — SODIUM CHLORIDE 0.9% FLUSH: 3.0000 mL | Freq: Once | INTRAVENOUS | Status: DC

## 2022-07-26 MED ORDER — STROKE: EARLY STAGES OF RECOVERY BOOK
Freq: Once | Status: AC
  Filled 2022-07-26: qty 1

## 2022-07-26 MED ORDER — EZETIMIBE 10 MG PO TABS: 10.0000 mg | ORAL_TABLET | Freq: Every day | ORAL | Status: DC

## 2022-07-26 MED ORDER — ALBUTEROL SULFATE (2.5 MG/3ML) 0.083% IN NEBU: 2.5000 mg | INHALATION_SOLUTION | Freq: Four times a day (QID) | RESPIRATORY_TRACT | Status: DC | PRN

## 2022-07-26 NOTE — Subjective & Objective (Signed)
CC: dysarthria HPI: 66 year old female with history of prior stroke, hypertension, tobacco abuse, presents to the ER today with dysarthria and left-sided weakness.  She states that she noticed she was not able to talk as well since yesterday morning.  Triage notes her last known normal was around 2:00 PM yesterday.  Patient is also noted increasing weakness of her left hand and left leg.  Patient brought in by ambulance.  On arrival temp not recorded.  Blood pressure 151/98.  Labs showed showed a creatinine of 1.18, BUN of 12, glucose of 102, rest of her CMP was in normal limits.  White count 11.2, hemoglobin 13.7, platelets 247  CT head showed advanced chronic small vessel ischemic disease.  CTA head neck was negative for LVO.  She did have some intracranial atherosclerosis with increased stenosis of the distal right M1 and posterior M2 branches.  Neurology has seen the patient and recommended admission for stroke work-up.

## 2022-07-26 NOTE — Assessment & Plan Note (Signed)
Admit to observation med telemetry bed.  Check MRI brain.  Check echo.  Check lipid panel.  Start aspirin and Plavix.  Neuro team to follow.  PT, OT, ST consults.

## 2022-07-26 NOTE — ED Provider Notes (Signed)
[facility]ENC[facility] DEPARTMENT Provider Note   CSN: [id number] Arrival date & time: 07/26/22  0441  An emergency department physician performed an initial assessment on this suspected stroke patient at 0441.  History  Chief Complaint  Patient presents with   Code Stroke  Level 5 caveat due to acuity of condition  Kara Bailey is a 66 y.o. female.  The history is provided by the patient and the EMS personnel.  Patient with history of hypertension peripheral vascular disease presents with stroke syndrome.  Patient reports around 2:30 PM yesterday she started having leg weakness.  It worsened throughout the day and has left arm weakness.  She also has slurred speech. No other details known on arrival    Home Medications Prior to Admission medications   Medication Sig Start Date End Date Taking? Authorizing Provider  albuterol (VENTOLIN HFA) 108 (90 Base) MCG/ACT inhaler Inhale 2 puffs into the lungs every 6 (six) hours as needed for shortness of breath or wheezing. 12/14/20  Yes [provider]  aspirin EC 81 MG EC tablet Take 1 tablet (81 mg total) by mouth daily. Swallow whole. 12/31/20  Yes Debbe Odea, MD  atorvastatin (LIPITOR) 80 MG tablet Take 1 tablet (80 mg total) by mouth daily. Patient taking differently: Take 80 mg by mouth at bedtime. 12/31/20  Yes Debbe Odea, MD  buPROPion (ZYBAN) 150 MG 12 hr tablet Take 150 mg by mouth daily as needed (to stop smoking). 01/09/22  Yes [provider]  clopidogrel (PLAVIX) 75 MG tablet TAKE 1 TABLET EVERY DAY Patient taking differently: Take 75 mg by mouth daily. 07/23/22  Yes Lorretta Harp, MD  diltiazem (CARDIZEM CD) 180 MG 24 hr capsule Take 180 mg by mouth daily.    [provider]  ezetimibe (ZETIA) 10 MG tablet Take 10 mg by mouth daily.    [provider]  famotidine (PEPCID) 40 MG tablet Take 40 mg by mouth at bedtime. 10/25/20   [provider]  hydrOXYzine  (ATARAX/VISTARIL) 50 MG tablet Take 50 mg by mouth 3 (three) times daily as needed for anxiety.    [provider]  Ketotifen Fumarate (ALLERGY EYE DROPS OP) Place 1 drop into both eyes daily as needed (allergies).    [provider]  mirtazapine (REMERON) 30 MG tablet Take 0.5 tablets (15 mg total) by mouth at bedtime. Patient taking differently: Take 30 mg by mouth at bedtime. 08/13/18   Argentina Donovan, PA-C  Multiple Vitamin (MULTIVITAMIN WITH MINERALS) TABS tablet Take 1 tablet by mouth daily. 12/31/20   Debbe Odea, MD  nicotine (NICODERM CQ - DOSED IN MG/24 HOURS) 14 mg/24hr patch Place 14 mg onto the skin daily as needed (nicotine dependence).    [provider]  pantoprazole (PROTONIX) 40 MG tablet Take 1 tablet (40 mg total) by mouth daily. Patient not taking: Reported on 03/13/2022 08/13/18   Argentina Donovan, PA-C      Allergies    Patient has no known allergies.    Review of Systems   Review of Systems  Unable to perform ROS: Acuity of condition    Physical Exam Updated Vital Signs BP (!) 151/98   Pulse 89   Resp 18   Wt 68.6 kg   SpO2 100%   BMI 25.17 kg/m  Physical Exam CONSTITUTIONAL: Elderly HEAD: Normocephalic/atraumatic EYES: EOMI/PERRL ENMT: Mucous membranes moist NECK: supple no meningeal signs SPINE/BACK:entire spine nontender CV: S1/S2 noted, no murmurs/rubs/gallops noted LUNGS: Lungs are clear  to auscultation bilaterally, no apparent distress ABDOMEN: soft, nontender NEURO: Pt is awake/alert Left arm and left leg drift noted.  Dysarthria noted See neurology consult note for full neurologic exam EXTREMITIES: pulses normal/equal, full ROM, pelvis stable, no signs of trauma SKIN: warm, color normal   ED Results / Procedures / Treatments   Labs (all labs ordered are listed, but only abnormal results are displayed) Labs Reviewed  CBC - Abnormal; Notable for the following components:      Result Value   WBC 11.2 (*)     All other components within normal limits  DIFFERENTIAL - Abnormal; Notable for the following components:   Neutro Abs 8.1 (*)    All other components within normal limits  COMPREHENSIVE METABOLIC PANEL - Abnormal; Notable for the following components:   Glucose, Bld 102 (*)    Creatinine, Ser 1.18 (*)    GFR, Estimated 50 (*)    All other components within normal limits  I-STAT CHEM 8, ED - Abnormal; Notable for the following components:   Glucose, Bld 101 (*)    All other components within normal limits  CBG MONITORING, ED - Abnormal; Notable for the following components:   Glucose-Capillary 104 (*)    All other components within normal limits  PROTIME-INR  APTT  ETHANOL    EKG None  Radiology CT ANGIO HEAD NECK W WO CM (CODE STROKE)  Result Date: 07/26/2022 CLINICAL DATA:  66 year old female code stroke presentation. EXAM: CT ANGIOGRAPHY HEAD AND NECK TECHNIQUE: Multidetector CT imaging of the head and neck was performed using the standard protocol during bolus administration of intravenous contrast. Multiplanar CT image reconstructions and MIPs were obtained to evaluate the vascular anatomy. Carotid stenosis measurements (when applicable) are obtained utilizing NASCET criteria, using the distal internal carotid diameter as the denominator. RADIATION DOSE REDUCTION: This exam was performed according to the departmental dose-optimization program which includes automated exposure control, adjustment of the mA and/or kV according to patient size and/or use of iterative reconstruction technique. CONTRAST:  82m OMNIPAQUE IOHEXOL 350 MG/ML SOLN COMPARISON:  CTA head and neck 12/29/2020. FINDINGS: CTA NECK Skeleton: Absent dentition and chronic straightening of cervical lordosis. Congenital incomplete segmentation of C2-C3. No acute osseous abnormality identified. Upper chest: Stable mild apical lung scarring and/or atelectasis. Negative visible superior mediastinum. Other neck: Stable,  negative. Aortic arch: 3 vessel arch configuration. Chronic soft and calcified arch atherosclerosis appears stable from last year. Right carotid system: Soft brachiocephalic artery plaque without stenosis. Patent right CCA origin. Proximal right CCA soft plaque appears stable without stenosis. Patent right carotid bifurcation. Chronic soft plaque in the posterior proximal right ICA without stenosis. Left carotid system: Stable soft plaque most pronounced at the left ICA origin. No significant stenosis. Vertebral arteries: Soft and calcified plaque in the proximal right subclavian artery is stable. Right vertebral artery origin remains patent without stenosis. Dominant right vertebral artery is stable and patent to the skull base without stenosis. Proximal left subclavian artery moderate soft plaque with less than 50% stenosis appears stable. Non dominant left vertebral artery origin remains patent and within normal limits. Non dominant left vertebral artery is stable and patent to the skull base. CTA HEAD Posterior circulation: Dominant right vertebral artery remains patent to the vertebrobasilar junction. Mild right V4 irregularity but no significant stenosis. Right PICA at arises from the right V3 segment just outside the skull and is patent. Non dominant distal left vertebral artery is stable and patent to the vertebrobasilar junction. Left AICA appears dominant.  Patent basilar artery with mild tortuosity, no significant stenosis. Left SCA and bilateral PCA origins are patent and within normal limits. Right SCA chronically not visualized. Posterior communicating arteries are diminutive or absent. Chronic bilateral PCA branch irregularity appears moderate in the right P2 segment and somewhat progressed from last year (series 10, image 21). Moderate right P3 and mild left P2/P3 irregularity and stenosis appears stable. Anterior circulation: Both ICA siphons are patent. Only mild siphon plaque. No siphon stenosis.  Patent carotid termini, MCA and ACA origins. Dominant right ACA A1 redemonstrated. Anterior communicating artery and bilateral ACA branches remain patent. Mild to moderate bilateral A2 segment irregularity and stenosis appears stable since last year (series 12, image 16), most pronounced in the distal left A2 segment. Left MCA M1 segment and bifurcation remain patent without stenosis. Left MCA branches are stable and relatively normal. Normal right MCA origin. But moderate irregularity in the distal M1 segment to the level of the bifurcation is chronic but progressed from last year (series 11, image 19). Despite this the right MCA bifurcation is patent. And there is similar progressed moderate irregularity and stenosis of the dominant posterior M2 branch (series 12, image 10). No discrete right MCA branch occlusion identified. Venous sinuses: Early contrast timing, grossly patent. Anatomic variants: Dominant right vertebral artery. Dominant right ACA A1. Review of the MIP images confirms the above findings Salient Right MCA findings were discussed by telephone with Dr. Jefferey Pica 07/26/2022 at 05:16 . IMPRESSION: 1. Negative for large vessel occlusion. 2. But positive for intracranial atherosclerosis, with some progression since the CTA last year: - increased moderate irregularity and stenosis of the distal Right M1 and dominant posterior M2 branch. - similar increased and moderate right PCA P2 stenosis. - stable mild to moderate bilateral ACA A2 and Left PCA irregularity and stenosis. 3. No extracranial hemodynamically significant stenosis despite atherosclerotic plaque. 4. Aortic Atherosclerosis (ICD10-I70.0). Electronically Signed   By: Genevie Ann M.D.   On: 07/26/2022 05:27   CT HEAD CODE STROKE WO CONTRAST  Result Date: 07/26/2022 CLINICAL DATA:  Code stroke.  66 year old female. EXAM: CT HEAD WITHOUT CONTRAST TECHNIQUE: Contiguous axial images were obtained from the base of the skull through the vertex  without intravenous contrast. RADIATION DOSE REDUCTION: This exam was performed according to the departmental dose-optimization program which includes automated exposure control, adjustment of the mA and/or kV according to patient size and/or use of iterative reconstruction technique. COMPARISON:  Brain MRI 12/29/2020.  Head CT 05/28/2022. FINDINGS: Brain: Chronic small-vessel ischemia. Cystic encephalomalacia most pronounced in the anterior right basal ganglia and/or deep white matter capsules. But heterogeneity throughout the bilateral deep gray nuclei. Right brainstem Wallerian degeneration. Chronic right superior cerebellar infarct. No midline shift, ventriculomegaly, mass effect, evidence of mass lesion, intracranial hemorrhage or evidence of cortically based acute infarction. Vascular: No suspicious intracranial vascular hyperdensity. Skull: Stable.  No acute osseous abnormality identified. Sinuses/Orbits: Visualized paranasal sinuses and mastoids are stable and well aerated. Other: No acute orbit or scalp soft tissue finding. ASPECTS [facility] Stroke Program Early CT Score) Total score (0-10 with 10 being normal): 10 IMPRESSION: 1. Advanced chronic small-vessel disease appears stable by CT since July. No acute cortically based infarct or acute intracranial hemorrhage identified. ASPECTS 10. 2. These results were communicated to Dr. Lorrin Goodell at 4:59 am on 07/26/2022 by text page via the [facility] messaging system. Electronically Signed   By: Genevie Ann M.D.   On: 07/26/2022 04:59    Procedures Procedures    Medications Ordered in  ED Medications  sodium chloride flush (NS) 0.9 % injection 3 mL (has no administration in time range)  iohexol (OMNIPAQUE) 350 MG/ML injection 75 mL (75 mLs Intravenous Contrast Given 07/26/22 0509)    ED Course/ Medical Decision Making/ A&P Clinical Course as of 07/26/22 0541  Thu Jul 26, 2022  0500 WBC(!): 11.2 Mild leukocytosis [DW]  0541 Discussed with neurologist Dr.  Ruben Reason medical admission as she is out of the window for any intervention [DW]  0541 Discussed with Dr. Bridgett Larsson for admission [DW]    Clinical Course User Index [DW] Ripley Fraise, MD                           Medical Decision Making Amount and/or Complexity of Data Reviewed Labs: ordered. Decision-making details documented in ED Course. Radiology: ordered.  Risk Prescription drug management. Decision regarding hospitalization.   This patient presents to the ED for concern of weakness, this involves an extensive number of treatment options, and is a complaint that carries with it a high risk of complications and morbidity.  The differential diagnosis includes but is not limited to electrolyte abnormality, CVA, UTI, acute coronary syndrome  Comorbidities that complicate the patient evaluation: Patient's presentation is complicated by their history of hypertension and peripheral vascular disease  Social Determinants of Health: Patient's  alcohol use   increases the complexity of managing their presentation  Additional history obtained: Additional history obtained from EMS    Lab Tests: I Ordered, and personally interpreted labs.  The pertinent results include: Mild leukocytosis  Imaging Studies ordered: I ordered imaging studies including CT scan head   I independently visualized and interpreted imaging which showed no acute findings I agree with the radiologist interpretation   Critical Interventions:  Admit for stroke work-up  Consultations Obtained: I requested consultation with the admitting physician [facility] , and discussed  findings as well as pertinent plan - they recommend: Admit  Reevaluation: After the interventions noted above, I reevaluated the patient and found that they have :stayed the same  Complexity of problems addressed: Patient's presentation is most consistent with  acute presentation with potential threat to life or bodily  function  Disposition: After consideration of the diagnostic results and the patient's response to treatment,  I feel that the patent would benefit from admission   .           Final Clinical Impression(s) / ED Diagnoses Final diagnoses:  Acute ischemic stroke [facility])    Rx / DC Orders ED Discharge Orders     None         Ripley Fraise, MD 07/26/22 [phone]

## 2022-07-26 NOTE — Assessment & Plan Note (Signed)
Likely due to CVA. Will need PT/OT.

## 2022-07-26 NOTE — Progress Notes (Signed)
Spoke to son, Tayelor Osborne, who was updated on patient's status. Is in agreement with current treatment plan.   Son mentions that he does not want patient leaving AMA, should she request it. Mentions that he, along with sister, Jaquita Bessire, and stepfather, Dalicia Kisner, are designated POAs. Son will be by tonight and provide the documentation.

## 2022-07-26 NOTE — ED Triage Notes (Signed)
Pt bib [facility] as from home as a code stroke after falling due to intermittent L leg weakness. LKN was 1430, pt was cleaning the house and felt her left leg give out. No LOC, denies hitting head. Hx stroke, slurred speech at baseline, pts family reports it has worsened. ETOH+, reports having one drink tonight.   BP 180/90 HR 82 SPO2 100% RA CBG 131

## 2022-07-26 NOTE — ED Notes (Signed)
Verbal report given to floor RN at this time

## 2022-07-26 NOTE — Consult Note (Signed)
NEUROLOGY CONSULTATION NOTE   Date of service: July 26, 2022 Patient Name: Kara Bailey MRN:  [id number] DOB:  [birth year] Reason for consult: "L sided weakness" Requesting Provider: Ripley Fraise, MD _ _ _   _ __   _ __ _ _  __ __   _ __   __ _  History of Present Illness  Kara Bailey is a 66 y.o. female with PMH significant for emphysema, PAD, prior L thalamic stroke, shingles, smoker who presents with L sided weakness. Was cleaning yesterday afternoon when she had left sided weakness.  She has had prior L thalamic stroke with intermittent mild slurred speech. This episode if very different. She also endorses having some alcohol tonight. Smokes 5-6 cigarettes a day. She thought that she was just tired so did not come in early.  LKW: 1430 on 07/25/22. mRS: 1 tNKASE: not offered, outside of window Thrombectomy: not offered, no obvious LVO NIHSS components Score: Comment  1a Level of Conscious 0'[x]'$  1'[]'$  2'[]'$  3'[]'$      1b LOC Questions 0'[]'$  1'[x]'$  2'[]'$       1c LOC Commands 0'[x]'$  1'[]'$  2'[]'$       2 Best Gaze 0'[x]'$  1'[]'$  2'[]'$       3 Visual 0'[x]'$  1'[]'$  2'[]'$  3'[]'$      4 Facial Palsy 0'[x]'$  1'[]'$  2'[]'$  3'[]'$      5a Motor Arm - left 0'[]'$  1'[]'$  2'[x]'$  3'[]'$  4'[]'$  UN'[]'$    5b Motor Arm - Right 0'[x]'$  1'[]'$  2'[]'$  3'[]'$  4'[]'$  UN'[]'$    6a Motor Leg - Left 0'[]'$  1'[]'$  2'[x]'$  3'[]'$  4'[]'$  UN'[]'$    6b Motor Leg - Right 0'[x]'$  1'[]'$  2'[]'$  3'[]'$  4'[]'$  UN'[]'$    7 Limb Ataxia 0'[]'$  1'[]'$  2'[x]'$  3'[]'$  UN'[]'$     8 Sensory 0'[]'$  1'[]'$  2'[x]'$  UN'[]'$      9 Best Language 0'[x]'$  1'[]'$  2'[]'$  3'[]'$      10 Dysarthria 0'[]'$  1'[]'$  2'[x]'$  UN'[]'$      11 Extinct. and Inattention 0'[x]'$  1'[]'$  2'[]'$       TOTAL: 11     ROS   Constitutional Denies weight loss, fever and chills.   HEENT Denies changes in vision and hearing.   Respiratory Denies SOB and cough.   CV Denies palpitations and CP   GI Denies abdominal pain, nausea, vomiting and diarrhea.   GU Denies dysuria and urinary frequency.   MSK Denies myalgia and joint pain.   Skin Denies rash and pruritus.   Neurological Denies headache and syncope.    Psychiatric Denies recent changes in mood. Denies anxiety and depression.    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   Stroke ([place])    just slurred speech   Tachycardia 08/2014   Ventral hernia    Weakness 08/2014   Past Surgical History:  Procedure Laterality Date   ABDOMINAL AORTOGRAM W/LOWER EXTREMITY N/A 07/13/2021   Procedure: ABDOMINAL AORTOGRAM W/LOWER EXTREMITY;  Surgeon: Lorretta Harp, MD;  Location: [place] [facility];  Service: Cardiovascular;  Laterality: N/A;   ABDOMINAL AORTOGRAM W/LOWER EXTREMITY N/A 03/19/2022   Procedure: ABDOMINAL AORTOGRAM W/LOWER EXTREMITY;  Surgeon: Lorretta Harp, MD;  Location: [place] [facility];  Service: Cardiovascular;  Laterality: N/A;   APPENDECTOMY     CATARACT EXTRACTION, BILATERAL     COLONOSCOPY     COLONOSCOPY WITH PROPOFOL N/A 04/30/2016   Procedure: COLONOSCOPY WITH PROPOFOL;  Surgeon: Daneil Dolin, MD;  [facility]: [facility];  Service: Endoscopy;  Laterality: N/A;  [place] N/A 10/13/2021   Procedure: OPEN REPAIR EPIGASTRIC HERNIA WITH MESH PATCH;  Surgeon: Armandina Gemma, MD;  Location: [facility];  Service: General;  Laterality: N/A;   ESOPHAGOGASTRODUODENOSCOPY     approximately 2010   ESOPHAGOGASTRODUODENOSCOPY (EGD) WITH PROPOFOL N/A 04/30/2016   Procedure: ESOPHAGOGASTRODUODENOSCOPY (EGD) WITH PROPOFOL;  Surgeon: Daneil Dolin, MD;  Location: [facility];  Service: Endoscopy;  Laterality: N/A;   LEFT HEART  CATHETERIZATION WITH CORONARY ANGIOGRAM N/A 10/29/2014   07-14-20- pt denies this Procedure: LEFT HEART CATHETERIZATION WITH CORONARY ANGIOGRAM;  Surgeon: Burnell Blanks, MD;  Location: [facility];  Service: Cardiovascular;  Laterality: N/A;   MALONEY DILATION N/A 04/30/2016   Procedure: Venia Minks DILATION;  Surgeon: Daneil Dolin, MD;  Location: [facility];  Service: Endoscopy;  Laterality: N/A;   PERIPHERAL VASCULAR INTERVENTION  07/13/2021   Procedure: PERIPHERAL VASCULAR INTERVENTION;  Surgeon: Lorretta Harp, MD;  Location: [place] [facility];  Service: Cardiovascular;;  left SFA left external iliac   PERIPHERAL VASCULAR INTERVENTION Right 03/19/2022   Procedure: PERIPHERAL VASCULAR INTERVENTION;  Surgeon: Lorretta Harp, MD;  Location: [place] [facility];  Service: Cardiovascular;  Laterality: Right;  SFA   POLYPECTOMY  04/30/2016   Procedure: POLYPECTOMY;  Surgeon: Daneil Dolin, MD;  [facility]: [facility];  Service: Endoscopy;;  Sigmoid colon polyp removed via hot snare   UPPER GASTROINTESTINAL ENDOSCOPY     Family History  Problem Relation Age of Onset   Heart failure Mother    Heart attack Mother    Heart disease Mother    Hypertension Mother    Hyperlipidemia Mother    CVA Father    Alzheimer's disease Father    Cancer Neg Hx    Colon cancer Neg Hx    Esophageal cancer Neg Hx    Rectal cancer Neg Hx    Stomach cancer Neg Hx    Social History   Socioeconomic History   Marital status: Divorced    Spouse name: Not on file   Number of children: 2   Years of education: Not on file   Highest education level: Not on file  Occupational History   Occupation: retired  Tobacco Use   Smoking status: Every Day    Packs/day: 0.50    Years: 40.00    Total pack years: 20.00    Types: Cigarettes    Start date: 11/12/1968   Smokeless tobacco: Never   Tobacco comments:    cutting back, wearing patches  Vaping Use   Vaping Use: Never used  Substance and Sexual  Activity   Alcohol use: Yes    Alcohol/week: 0.0 standard drinks of alcohol    Comment: beer on weekends (6-pack)   Drug use: No   Sexual activity: Not Currently  Other Topics Concern   Not on file  Social History Narrative   Not on file   Social Determinants of Health   Financial Resource Strain: Not on file  Food Insecurity: Not on file  Transportation Needs: Not on file  Physical Activity: Not on file  Stress: Not on file  Social Connections: Not on file   No Known Allergies  Medications  (Not in a hospital admission)    Vitals   There were no vitals filed for this visit.   There is no height or weight on file to calculate BMI.  Physical Exam   General: Laying comfortably in bed; in no acute distress.  HENT: Normal oropharynx and mucosa. Normal external appearance of ears and nose.  Neck: Supple, no pain or tenderness  CV: No JVD. No peripheral edema.  Pulmonary: Symmetric Chest rise. Normal respiratory effort.  Abdomen: Soft to touch, non-tender.  Ext: No cyanosis, edema, or deformity  Skin: No rash. Normal palpation of skin.   Musculoskeletal: Normal digits and nails by inspection. No clubbing.   Neurologic Examination  Mental status/Cognition: Alert, oriented to self, place, but not to month. Oriented to year, good attention.  Speech/language: dysarthric, non fluent, comprehension intact, object naming intact, repetition intact. Cranial nerves:   CN II Pupils equal and reactive to light, no VF deficits    CN III,IV,VI EOM intact, no gaze preference or deviation, no nystagmus    CN V normal sensation in V1, V2, and V3 segments bilaterally    CN VII no asymmetry, no nasolabial fold flattening    CN VIII normal hearing to speech    CN IX & X normal palatal elevation, no uvular deviation    CN XI 5/5 head turn and 5/5 shoulder shrug bilaterally    CN XII midline tongue protrusion    Motor:  Muscle bulk: poor, tone normal. Mvmt Root Nerve  Muscle Right Left  Comments  SA C5/6 Ax Deltoid 5 3   EF C5/6 Mc Biceps 5 3   EE C6/7/8 Rad Triceps 5 3   WF C6/7 Med FCR     WE C7/8 PIN ECU     F Ab C8/T1 U ADM/FDI 5 4+   HF L1/2/3 Fem Illopsoas 5 3   KE L2/3/4 Fem Quad 5 4   DF L4/5 D Peron Tib Ant 5 4   PF S1/2 Tibial Grc/Sol 5 4    Sensation:  Light touch Decreased to light touch in LUE and LLE   Pin prick    Temperature    Vibration   Proprioception    Coordination/Complex Motor:  - Finger to Nose unable to do with LLE but arm sways a lot concerning for ataxia. - Heel to shin with ataxia in LLE and hardly able to do - Rapid alternating movement are slowed in LUE and LLE. - Gait: Deferred for patient safety given L sided weakness.  Labs   CBC:  Recent Labs  Lab 07/26/22 0448  HGB 13.9  HCT 50.9    Basic Metabolic Panel:  Lab Results  Component Value Date   NA 142 07/26/2022   K 3.6 07/26/2022   CO2 21 (L) 05/28/2022   GLUCOSE 101 (H) 07/26/2022   BUN 13 07/26/2022   CREATININE 1.00 07/26/2022   CALCIUM 9.2 05/28/2022   GFRNONAA 60 (L) 05/28/2022   GFRAA 71 12/20/2020   Lipid Panel:  Lab Results  Component Value Date   LDLCALC 58 12/29/2020   HgbA1c:  Lab Results  Component Value Date   HGBA1C 5.5 12/29/2020   Urine Drug Screen:     Component Value Date/Time   LABOPIA NONE DETECTED 05/28/2022 1152   COCAINSCRNUR NONE DETECTED  05/28/2022 [place] 03/01/2008 1430   LABBENZ NONE DETECTED 05/28/2022 1152   LABBENZ NEGATIVE 03/01/2008 1430   AMPHETMU NONE DETECTED 05/28/2022 [place] 05/28/2022 1152   LABBARB NONE DETECTED 05/28/2022 1152    Alcohol Level     Component Value Date/Time   ETH <10 05/28/2022 1212    CT Head without contrast(Personally reviewed): CTH was negative for a large hypodensity concerning for a large territory infarct or hyperdensity concerning for an ICH  CT angio Head and Neck with contrast(Personally reviewed): No LVO but has progression of  multifocal multivessel stenosis compared to prior CTA from Feb 2022.  MRI Brain: pending  Impression   Kara Bailey is a 66 y.o. female with PMH significant for emphysema, PAD, prior L thalamic stroke, shingles, smoker who presents with L sided weakness. Was cleaning yesterday afternoon when she had left sided weakness.  Suspect that she probably had a small vessel stroke.  Primary Diagnosis:  Cerebral infarction, unspecified.  Recommendations   - Frequent Neuro checks per stroke unit protocol - Recommend brain imaging with MRI Brain without contrast - Recommend obtaining TTE - Recommend obtaining Lipid panel with LDL - Please start statin if LDL > 70 - Recommend HbA1c - Antithrombotic - continue home Dual antiplatelet treatment with Aspirin '81mg'$  daily and plavix '75mg'$  daily. - Recommend DVT ppx - SBP goal - permissive hypertension first 24 h < 220/110. Held home meds.  - Recommend Telemetry monitoring for arrythmia - Recommend bedside swallow screen prior to PO intake. - Stroke education booklet - Recommend PT/OT/SLP consult - Recommend EtOH levels. - counseled on the importance of quitting smoking to reduce risk fo strokes in the future. She reports that she is gradually cutting down on smoking.  ______________________________________________________________________   Thank you for the opportunity to take part in the care of this patient. If you have any further questions, please contact the neurology consultation attending.  Signed,  [place] Pager Number [phone] _ _ _   _ __   _ __ _ _  __ __   _ __   __ _

## 2022-07-26 NOTE — ED Notes (Signed)
Tele tracking 

## 2022-07-26 NOTE — ED Notes (Addendum)
Dr. Darrick Meigs notified via secure chat of the patient and families concerns. Family expresses need for the patient to eat and expresses dissatisfaction with the patient's visit at [place]. This RN reminds the patient and family that the provider has been contacted and this RN does her best to provide an update to the family

## 2022-07-26 NOTE — ED Notes (Addendum)
Provider reports that she will call the family members and update them on the plan of care (Dr. Serena Croissant)

## 2022-07-26 NOTE — Assessment & Plan Note (Signed)
Start nicotine patch 14 mg/day.

## 2022-07-26 NOTE — Progress Notes (Signed)
STROKE TEAM PROGRESS NOTE   INTERVAL HISTORY Patient evaluated at bedside, is seen resting comfortably. Reports she began to experience left sided weakness and numbness that began at 2:30 PM on 07/25/2022, states that she fell without hitting her or losing consciousness. Confirms she had a prior stroke in Feb 2022. Endorses diminished sensation of left side, left sided facial droop is noted. No other acute complaints, only mentions she is hungry and would like some food.  Vitals:   07/26/22 0627 07/26/22 0628 07/26/22 0724 07/26/22 1117  BP:   112/86 (!) 167/73  Pulse:   79 84  Resp:   13 15  Temp: 98.1 F (36.7 C)   98 F (36.7 C)  TempSrc: Oral   Oral  SpO2:   100% 100%  Weight:  63.5 kg    Height:  '5\' 6"'$  (1.676 m)     CBC:  Recent Labs  Lab 07/26/22 0444 07/26/22 0448  WBC 11.2*  --   NEUTROABS 8.1*  --   HGB 13.7 13.9  HCT 41.5 41.0  MCV 92.0  --   PLT 247  --    Basic Metabolic Panel:  Recent Labs  Lab 07/26/22 0444 07/26/22 0448  NA 141 142  K 3.6 3.6  CL 110 108  CO2 23  --   GLUCOSE 102* 101*  BUN 12 13  CREATININE 1.18* 1.00  CALCIUM 9.7  --    Lipid Panel:  Recent Labs  Lab 07/26/22 0909  CHOL 185  TRIG 97  HDL 41  CHOLHDL 4.5  VLDL 19  LDLCALC 125*   HgbA1c:  Recent Labs  Lab 07/26/22 0444  HGBA1C 5.4   Urine Drug Screen: No results for input(s): "LABOPIA", "COCAINSCRNUR", "LABBENZ", "AMPHETMU", "THCU", "LABBARB" in the last 168 hours.  Alcohol Level  Recent Labs  Lab 07/26/22 0444  ETH <10    IMAGING past 24 hours MR BRAIN WO CONTRAST  Result Date: 07/26/2022 CLINICAL DATA:  66 year old female code stroke presentation this morning. EXAM: MRI HEAD WITHOUT CONTRAST TECHNIQUE: Multiplanar, multiecho pulse sequences of the brain and surrounding structures were obtained without intravenous contrast. COMPARISON:  CT head and CTA head and neck earlier today. Previous brain MRI 12/29/2020. FINDINGS: Brain: Patchy restricted diffusion tracking  from the posterior right corona radiata toward the posterior right lentiform (series 2, image 29). This is adjacent to chronic infarct with encephalomalacia in the anterior and central corona radiata. No acute hemorrhage or mass effect. No other restricted diffusion. And elsewhere stable advanced chronic mostly small vessel ischemic disease throughout the bilateral deep gray nuclei, deep white matter capsules, and brainstem. Chronic hemorrhagic right superior cerebellar infarct is unchanged. Smaller chronic cerebellar lacunar infarcts mostly in the right PICA territory. And there are occasional other chronic microhemorrhages in the brain (left anterior frontal lobe white matter series 7, image 56). No midline shift, mass effect, evidence of mass lesion, ventriculomegaly, extra-axial collection or acute intracranial hemorrhage. Cervicomedullary junction and pituitary are within normal limits.- Vascular: Major intracranial vascular flow voids are stable. Skull and upper cervical spine: Congenital incomplete segmentation of C2-C3. Otherwise negative for age visible cervical spine. Visualized bone marrow signal is within normal limits. Sinuses/Orbits: Stable, negative. Other: Visible internal auditory structures appear normal. Negative visible scalp and face. IMPRESSION: 1. Patchy acute on chronic small vessel ischemia in the right corona radiata, tracking toward the posterior right lentiform. No associated hemorrhage or mass effect. 2. Otherwise stable advanced chronic small vessel disease and chronic hemorrhagic right superior cerebellar infarct.  Electronically Signed   By: Genevie Ann M.D.   On: 07/26/2022 09:52   CT ANGIO HEAD NECK W WO CM (CODE STROKE)  Result Date: 07/26/2022 CLINICAL DATA:  66 year old female code stroke presentation. EXAM: CT ANGIOGRAPHY HEAD AND NECK TECHNIQUE: Multidetector CT imaging of the head and neck was performed using the standard protocol during bolus administration of intravenous  contrast. Multiplanar CT image reconstructions and MIPs were obtained to evaluate the vascular anatomy. Carotid stenosis measurements (when applicable) are obtained utilizing NASCET criteria, using the distal internal carotid diameter as the denominator. RADIATION DOSE REDUCTION: This exam was performed according to the departmental dose-optimization program which includes automated exposure control, adjustment of the mA and/or kV according to patient size and/or use of iterative reconstruction technique. CONTRAST:  64m OMNIPAQUE IOHEXOL 350 MG/ML SOLN COMPARISON:  CTA head and neck 12/29/2020. FINDINGS: CTA NECK Skeleton: Absent dentition and chronic straightening of cervical lordosis. Congenital incomplete segmentation of C2-C3. No acute osseous abnormality identified. Upper chest: Stable mild apical lung scarring and/or atelectasis. Negative visible superior mediastinum. Other neck: Stable, negative. Aortic arch: 3 vessel arch configuration. Chronic soft and calcified arch atherosclerosis appears stable from last year. Right carotid system: Soft brachiocephalic artery plaque without stenosis. Patent right CCA origin. Proximal right CCA soft plaque appears stable without stenosis. Patent right carotid bifurcation. Chronic soft plaque in the posterior proximal right ICA without stenosis. Left carotid system: Stable soft plaque most pronounced at the left ICA origin. No significant stenosis. Vertebral arteries: Soft and calcified plaque in the proximal right subclavian artery is stable. Right vertebral artery origin remains patent without stenosis. Dominant right vertebral artery is stable and patent to the skull base without stenosis. Proximal left subclavian artery moderate soft plaque with less than 50% stenosis appears stable. Non dominant left vertebral artery origin remains patent and within normal limits. Non dominant left vertebral artery is stable and patent to the skull base. CTA HEAD Posterior  circulation: Dominant right vertebral artery remains patent to the vertebrobasilar junction. Mild right V4 irregularity but no significant stenosis. Right PICA at arises from the right V3 segment just outside the skull and is patent. Non dominant distal left vertebral artery is stable and patent to the vertebrobasilar junction. Left AICA appears dominant. Patent basilar artery with mild tortuosity, no significant stenosis. Left SCA and bilateral PCA origins are patent and within normal limits. Right SCA chronically not visualized. Posterior communicating arteries are diminutive or absent. Chronic bilateral PCA branch irregularity appears moderate in the right P2 segment and somewhat progressed from last year (series 10, image 21). Moderate right P3 and mild left P2/P3 irregularity and stenosis appears stable. Anterior circulation: Both ICA siphons are patent. Only mild siphon plaque. No siphon stenosis. Patent carotid termini, MCA and ACA origins. Dominant right ACA A1 redemonstrated. Anterior communicating artery and bilateral ACA branches remain patent. Mild to moderate bilateral A2 segment irregularity and stenosis appears stable since last year (series 12, image 16), most pronounced in the distal left A2 segment. Left MCA M1 segment and bifurcation remain patent without stenosis. Left MCA branches are stable and relatively normal. Normal right MCA origin. But moderate irregularity in the distal M1 segment to the level of the bifurcation is chronic but progressed from last year (series 11, image 19). Despite this the right MCA bifurcation is patent. And there is similar progressed moderate irregularity and stenosis of the dominant posterior M2 branch (series 12, image 10). No discrete right MCA branch occlusion identified. Venous sinuses: Early contrast  timing, grossly patent. Anatomic variants: Dominant right vertebral artery. Dominant right ACA A1. Review of the MIP images confirms the above findings Salient  Right MCA findings were discussed by telephone with Dr. Jefferey Pica 07/26/2022 at 05:16 . IMPRESSION: 1. Negative for large vessel occlusion. 2. But positive for intracranial atherosclerosis, with some progression since the CTA last year: - increased moderate irregularity and stenosis of the distal Right M1 and dominant posterior M2 branch. - similar increased and moderate right PCA P2 stenosis. - stable mild to moderate bilateral ACA A2 and Left PCA irregularity and stenosis. 3. No extracranial hemodynamically significant stenosis despite atherosclerotic plaque. 4. Aortic Atherosclerosis (ICD10-I70.0). Electronically Signed   By: Genevie Ann M.D.   On: 07/26/2022 05:27   CT HEAD CODE STROKE WO CONTRAST  Result Date: 07/26/2022 CLINICAL DATA:  Code stroke.  66 year old female. EXAM: CT HEAD WITHOUT CONTRAST TECHNIQUE: Contiguous axial images were obtained from the base of the skull through the vertex without intravenous contrast. RADIATION DOSE REDUCTION: This exam was performed according to the departmental dose-optimization program which includes automated exposure control, adjustment of the mA and/or kV according to patient size and/or use of iterative reconstruction technique. COMPARISON:  Brain MRI 12/29/2020.  Head CT 05/28/2022. FINDINGS: Brain: Chronic small-vessel ischemia. Cystic encephalomalacia most pronounced in the anterior right basal ganglia and/or deep white matter capsules. But heterogeneity throughout the bilateral deep gray nuclei. Right brainstem Wallerian degeneration. Chronic right superior cerebellar infarct. No midline shift, ventriculomegaly, mass effect, evidence of mass lesion, intracranial hemorrhage or evidence of cortically based acute infarction. Vascular: No suspicious intracranial vascular hyperdensity. Skull: Stable.  No acute osseous abnormality identified. Sinuses/Orbits: Visualized paranasal sinuses and mastoids are stable and well aerated. Other: No acute orbit or scalp soft  tissue finding. ASPECTS [facility] Stroke Program Early CT Score) Total score (0-10 with 10 being normal): 10 IMPRESSION: 1. Advanced chronic small-vessel disease appears stable by CT since July. No acute cortically based infarct or acute intracranial hemorrhage identified. ASPECTS 10. 2. These results were communicated to Dr. Lorrin Goodell at 4:59 am on 07/26/2022 by text page via the [facility] messaging system. Electronically Signed   By: Genevie Ann M.D.   On: 07/26/2022 04:59    --PHYSICAL EXAM-- Constitutional: Thin elderly woman, in no acute distress.  HEENT: Normocephalic, Normal conjunctiva, anicteric. Hearing grossly intact. No nasal discharge. Neck: Neck is supple. No masses or thyromegaly. Resp: Respirations are non-labored. No wheezing. Skin: Warm. No rashes or ulcers.  Neuro Mental Status AAOx3  Language: Comprehension and naming intact   Cranial Nerves II: Pupils equal and reactive to light, no VF deficits III, IV, VI: Normal oropharynx and mucosa. Normal external appearance of ears and nose.  V: normal sensation in V1, V2, and V3 segments bilaterally   VII: Left facial droop VIII: Grossly normal hearing acuity, no reported changes in balance or coordination. IX, X: normal palatal elevation, no uvular deviation XI: 5/5 head turn and 5/5 shoulder shrug bilaterally XII: midline tongue protrusion  Motor Muscle: Bulk poor, Tone normal Strength R UE: 5/5 L UE: 3/5 R LE: 5/5 L LE: 3/5  Sensory  Diminished sensation to light touch in LUE and LLE Intact sensation on RUE and RLE  Cerebellar: - Finger to Nose unable to do with LLE but arm sways a lot concerning for ataxia. - Heel to shin with ataxia in LLE and hardly able to do - Rapid alternating movement are slowed in LUE and LLE. - Gait: Deferred for patient safety given L sided weakness.  ASSESSMENT/PLAN Ms. Kara Bailey is a 66 y.o. female with history of emphysema, PAD, prior L thalamic stroke, shingle, smoker who  presents to ED on 07/26/2022 for L sided weakness that began day prior to admission at [facility], admitted for suspicion of small vessel stroke and is found to have chronic small vessel ischemia of right corona radiate and posterior right lentiform on imaging. Patient examined this morning, no acute changes since admission.  Stroke: Patchy acute on chronic small vessel ischemia of R corona radiata and posterior R lentiform Etiology: Likely chronic small vessel disease  Code Stroke CT head No acute abnormality. Advanced chronic small vessel disease. Atrophy. ASPECTS 10.   CTA head & neck : No large vessel occlusion.  Intracranial atherosclerosis with progression from previous CT in 2022 Moderate irregularity and stenosis of the distal Right M1 and dominant posterior M2 branch Increased, moderate right PCA P2 stenosis Mild-moderate bilateral ACA A2 and Left PCA irregularity and stenosis. Aortic Atherosclerosis MRI:  Patchy acute on chronic small vessel ischemia in the right corona radiata, tracking toward the posterior right lentiform. Stable chronic small vessel disease Chronic hemorrhagic superior cerebellar infarct 2D Echo: pending LDL 125 HgbA1c 5.4 VTE prophylaxis - Lovenox 30 mg injection    Diet   Diet NPO time specified   aspirin 81 mg daily and clopidogrel 75 mg daily prior to admission, now continued on admission. Therapy recommendations:  pending Disposition:  pending  Other Stroke Risk Factors Advanced Age >/= 91  Cigarette smoker, advised to stop smoking Hx stroke  Hypertension Home meds:  none Stable Permissive hypertension in first 24 hours <220/110. Hold meds Long-term BP goal normotensive  Hyperlipidemia Home meds:  lipitor 80, zetia 10 resumed in hospital LDL 125, goal < 70 Continue statin at discharge  Depression/Anxiety Continue home Remeron, bupropion  Emphysema Albuterol inhaler Q6Hr PRN  GERD Home Protonix 40 mg, resumed on admission   Hospital day #  0  Christene Slates, MD PGY-1   To contact Stroke Continuity provider, please refer to [email or web address] After hours, contact General Neurology

## 2022-07-26 NOTE — ED Notes (Signed)
Patient transported to MRI 

## 2022-07-26 NOTE — Progress Notes (Signed)
Subjective: Patient admitted this morning, see detailed H&P by Dr Bridgett Larsson 66 year old female with a history of prior stroke, hypertension, tobacco abuse came to ED with dysarthria and left-sided weakness.  CT head showed advanced chronic small vessel ischemic disease.  CTA head and neck was negative for LVO.  Patient does have some intracranial atherosclerosis with increased stenosis of distal right M1 and posterior M2 branches.  Neurology was consulted, recommended stroke work-up.  Vitals:   07/26/22 0724 07/26/22 1117  BP: 112/86 (!) 167/73  Pulse: 79 84  Resp: 13 15  Temp:  98 F (36.7 C)  SpO2: 100% 100%      A/P  Stroke -MRI brain obtained today shows no acute infarct but shows acute on chronic small vessel ischemia in the right corona radiata -Check echo, lipid panel -Continue aspirin and Plavix -Neuro following  Left hemiplegia -Likely due to CVA -PT/OT consult  Essential hypertension -Allow for permissive hypertension  Tobacco abuse -Started on nicotine patch     [name] [facility]

## 2022-07-26 NOTE — Code Documentation (Signed)
Stroke Response Nurse Documentation Code Documentation  SUNJAI LEVANDOSKI is a 66 y.o. female arriving to [facility]  via [facility] on 9/14 with past medical hx of CVA, HTN, smoking. On aspirin 81 mg daily and clopidogrel 75 mg daily. Code stroke was activated by EMS.   Patient from home where she was LKW at 1430 and now complaining of left sided weakness and slurred speech.    Stroke team at the bedside on patient arrival. Labs drawn and patient cleared for CT by Dr. Laverta Baltimore. Patient to CT with team. NIHSS 11, see documentation for details and code stroke times. Patient with disoriented, left arm weakness, left leg weakness, left limb ataxia, left decreased sensation, and dysarthria  on exam. The following imaging was completed:  CT Head and CTA. Patient is not a candidate for IV Thrombolytic due to outside of window. Patient is not a candidate for IR due to No LVO.    Bedside handoff with ED RN Aryana.    Madelynn Done  Rapid Response RN

## 2022-07-26 NOTE — H&P (Signed)
History and Physical    BREELLA VANOSTRAND YKD:[id number] DOB: [birth year] DOA: 07/26/2022  DOS: the patient was seen and examined on 07/26/2022  PCP: Andree Moro, DO   Patient coming from: Home  I have personally briefly reviewed patient's old medical records in [place]  CC: dysarthria HPI: 66 year old female with history of prior stroke, hypertension, tobacco abuse, presents to the ER today with dysarthria and left-sided weakness.  She states that she noticed she was not able to talk as well since yesterday morning.  Triage notes her last known normal was around 2:00 PM yesterday.  Patient is also noted increasing weakness of her left hand and left leg.  Patient brought in by ambulance.  On arrival temp not recorded.  Blood pressure 151/98.  Labs showed showed a creatinine of 1.18, BUN of 12, glucose of 102, rest of her CMP was in normal limits.  White count 11.2, hemoglobin 13.7, platelets 247  CT head showed advanced chronic small vessel ischemic disease.  CTA head neck was negative for LVO.  She did have some intracranial atherosclerosis with increased stenosis of the distal right M1 and posterior M2 branches.  Neurology has seen the patient and recommended admission for stroke work-up.   ED Course: CT head negative for acute CVA, CTA head/neck negative for LVO  Review of Systems:  Review of Systems  Constitutional: Negative.   HENT: Negative.    Eyes: Negative.   Respiratory: Negative.    Cardiovascular: Negative.   Gastrointestinal: Negative.   Genitourinary: Negative.   Musculoskeletal: Negative.   Skin: Negative.   Neurological:  Positive for speech change and focal weakness.  Endo/Heme/Allergies: Negative.   Psychiatric/Behavioral: Negative.    All other systems reviewed and are negative.   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   Stroke ([place])    just slurred speech   Tachycardia 08/2014   Ventral hernia    Weakness 08/2014    Past Surgical History:  Procedure Laterality Date   ABDOMINAL AORTOGRAM W/LOWER EXTREMITY N/A 07/13/2021   Procedure: ABDOMINAL AORTOGRAM W/LOWER EXTREMITY;  Surgeon: Lorretta Harp, MD;  Location: [place] [facility];  Service: Cardiovascular;  Laterality: N/A;   ABDOMINAL AORTOGRAM W/LOWER EXTREMITY N/A 03/19/2022   Procedure: ABDOMINAL AORTOGRAM W/LOWER EXTREMITY;  Surgeon: Lorretta Harp, MD;  Location: [place] [facility];  Service: Cardiovascular;  Laterality: N/A;   APPENDECTOMY     CATARACT EXTRACTION, BILATERAL     COLONOSCOPY     COLONOSCOPY WITH PROPOFOL N/A 04/30/2016   Procedure: COLONOSCOPY WITH PROPOFOL;  Surgeon: Daneil Dolin, MD;  [facility];  Service: Endoscopy;  Laterality: N/A;  [place] N/A 10/13/2021   Procedure: OPEN REPAIR EPIGASTRIC HERNIA WITH MESH PATCH;  Surgeon: Armandina Gemma, MD;  Location: [facility];  Service: General;  Laterality: N/A;   ESOPHAGOGASTRODUODENOSCOPY     approximately 2010   ESOPHAGOGASTRODUODENOSCOPY (EGD) WITH  PROPOFOL N/A 04/30/2016   Procedure: ESOPHAGOGASTRODUODENOSCOPY (EGD) WITH PROPOFOL;  Surgeon: Daneil Dolin, MD;  [facility];  Service: Endoscopy;  Laterality: N/A;   LEFT HEART CATHETERIZATION WITH CORONARY ANGIOGRAM N/A 10/29/2014   07-14-20- pt denies this Procedure: LEFT HEART CATHETERIZATION WITH CORONARY ANGIOGRAM;  Surgeon: Burnell Blanks, MD;  Location: [facility];  Service: Cardiovascular;  Laterality: N/A;   MALONEY  DILATION N/A 04/30/2016   Procedure: Venia Minks DILATION;  Surgeon: Daneil Dolin, MD;  [facility];  Service: Endoscopy;  Laterality: N/A;   PERIPHERAL VASCULAR INTERVENTION  07/13/2021   Procedure: PERIPHERAL VASCULAR INTERVENTION;  Surgeon: Lorretta Harp, MD;  Location: [place] [facility];  Service: Cardiovascular;;  left SFA left external iliac   PERIPHERAL VASCULAR INTERVENTION Right 03/19/2022   Procedure: PERIPHERAL VASCULAR INTERVENTION;  Surgeon: Lorretta Harp, MD;  Location: [place] [facility];  Service: Cardiovascular;  Laterality: Right;  SFA   POLYPECTOMY  04/30/2016   Procedure: POLYPECTOMY;  Surgeon: Daneil Dolin, MD;  [facility];  Service: Endoscopy;;  Sigmoid colon polyp removed via hot snare   UPPER GASTROINTESTINAL ENDOSCOPY       reports that she has been smoking cigarettes. She started smoking about 53 years ago. She has a 20.00 pack-year smoking history. She has never used smokeless tobacco. She reports current alcohol use. She reports that she does not use drugs.  No Known Allergies  Family History  Problem Relation Age of Onset   Heart failure Mother    Heart attack Mother    Heart disease Mother    Hypertension Mother    Hyperlipidemia Mother    CVA Father    Alzheimer's disease Father    Cancer Neg Hx    Colon cancer Neg Hx    Esophageal cancer Neg Hx    Rectal cancer Neg Hx    Stomach cancer Neg Hx     Prior to Admission medications   Medication Sig Start Date End Date Taking? Authorizing Provider  albuterol (VENTOLIN HFA) 108 (90 Base) MCG/ACT inhaler Inhale 2 puffs into the lungs every 6 (six) hours as needed for shortness of breath or wheezing. 12/14/20  Yes [provider]  aspirin EC 81 MG EC tablet Take 1 tablet (81 mg total) by mouth daily. Swallow whole. 12/31/20  Yes Debbe Odea, MD  atorvastatin (LIPITOR) 80 MG tablet Take 1 tablet (80 mg total) by mouth daily. Patient taking differently: Take 80 mg by mouth  at bedtime. 12/31/20  Yes Debbe Odea, MD  buPROPion (ZYBAN) 150 MG 12 hr tablet Take 150 mg by mouth daily as needed (to stop smoking). 01/09/22  Yes [provider]  clopidogrel (PLAVIX) 75 MG tablet TAKE 1 TABLET EVERY DAY Patient taking differently: Take 75 mg by mouth daily. 07/23/22  Yes Lorretta Harp, MD  diltiazem (CARDIZEM CD) 180 MG 24 hr capsule Take 180 mg by mouth daily.   Yes [provider]  ezetimibe (ZETIA) 10 MG tablet Take 10 mg by mouth daily.   Yes [provider]  famotidine (PEPCID) 40 MG tablet Take 40 mg by mouth at bedtime. 10/25/20  Yes [provider]  hydrOXYzine (ATARAX/VISTARIL) 50 MG tablet Take 50 mg by mouth 3 (three) times daily as needed for anxiety.   Yes [provider]  mirtazapine (REMERON) 30 MG tablet Take 0.5 tablets (15 mg total) by mouth at bedtime. Patient taking differently: Take 30 mg by mouth at bedtime. 08/13/18  Yes Freeman Caldron M, PA-C  nicotine (NICODERM CQ - DOSED IN MG/24 HOURS) 14 mg/24hr patch Place 14 mg onto the skin daily as needed (nicotine dependence).   Yes [provider]  Multiple Vitamin (MULTIVITAMIN WITH MINERALS) TABS tablet Take 1 tablet by mouth daily. Patient not taking: Reported on 07/26/2022 12/31/20   Debbe Odea, MD  pantoprazole (PROTONIX) 40 MG tablet Take 1 tablet (40 mg total) by mouth daily. Patient not taking: Reported on 07/26/2022 08/13/18   Argentina Donovan, PA-C    Physical Exam: Vitals:   07/26/22 0400 07/26/22 0519  BP:  (!) 151/98  Pulse:  89  Resp:  18  SpO2:  100%  Weight: 68.6 kg     Physical Exam Vitals and nursing note reviewed.  Constitutional:      General: She is not in acute distress.    Appearance: She is not toxic-appearing.  HENT:     Head: Normocephalic and atraumatic.     Nose: Nose normal.  Cardiovascular:     Rate and Rhythm: Normal rate and regular rhythm.  Pulmonary:     Effort: Pulmonary effort is normal. No  respiratory distress.     Breath sounds: No wheezing or rales.  Abdominal:     General: Bowel sounds are normal. There is no distension.     Tenderness: There is no abdominal tenderness. There is no guarding or rebound.  Musculoskeletal:     Right lower leg: No edema.     Left lower leg: No edema.  Skin:    General: Skin is warm and dry.     Capillary Refill: Capillary refill takes less than 2 seconds.  Neurological:     Mental Status: She is alert and oriented to person, place, and time.     Cranial Nerves: Dysarthria present.     Comments: Left sided hemiplegia. Unable to lift left leg against gravity. Weak 3/5 hand grip strength on the left hand.      Labs on Admission: I have personally reviewed following labs and imaging studies  CBC: Recent Labs  Lab 07/26/22 0444 07/26/22 0448  WBC 11.2*  --   NEUTROABS 8.1*  --   HGB 13.7 13.9  HCT 41.5 41.0  MCV 92.0  --   PLT 247  --    Basic Metabolic Panel: Recent Labs  Lab 07/26/22 0444 07/26/22 0448  NA 141 142  K 3.6 3.6  CL 110 108  CO2 23  --   GLUCOSE 102* 101*  BUN 12 13  CREATININE 1.18* 1.00  CALCIUM 9.7  --    GFR: Estimated Creatinine Clearance: 52.4 mL/min (by C-G formula based on SCr of 1 mg/dL). Liver Function Tests: Recent Labs  Lab 07/26/22 0444  AST 18  ALT 13  ALKPHOS 84  BILITOT 0.5  PROT 7.1  ALBUMIN 3.8   No results for input(s): "LIPASE", "AMYLASE" in the last 168 hours. No results for input(s): "AMMONIA" in the last 168 hours. Coagulation Profile: Recent Labs  Lab 07/26/22 0444  INR 1.0   Cardiac Enzymes: No results for input(s): "CKTOTAL", "CKMB", "CKMBINDEX", "TROPONINI", "TROPONINIHS" in the last 168 hours. BNP (last 3 results) No results for input(s): "PROBNP" in the last 8760 hours. HbA1C: No results for input(s): "HGBA1C" in the last 72 hours. CBG: Recent Labs  Lab 07/26/22 0443  GLUCAP 104*   Lipid Profile: No results for input(s): "CHOL", "HDL", "LDLCALC",  "TRIG", "CHOLHDL", "LDLDIRECT" in the last 72 hours. Thyroid Function Tests: No results for  input(s): "TSH", "T4TOTAL", "FREET4", "T3FREE", "THYROIDAB" in the last 72 hours. Anemia Panel: No results for input(s): "VITAMINB12", "FOLATE", "FERRITIN", "TIBC", "IRON", "RETICCTPCT" in the last 72 hours. Urine analysis:    Component Value Date/Time   COLORURINE YELLOW 05/28/2022 1152   APPEARANCEUR CLEAR 05/28/2022 1152   LABSPEC 1.011 05/28/2022 1152   PHURINE 5.0 05/28/2022 1152   GLUCOSEU NEGATIVE 05/28/2022 1152   HGBUR NEGATIVE 05/28/2022 1152   BILIRUBINUR NEGATIVE 05/28/2022 1152   BILIRUBINUR small 06/15/2014 1038   KETONESUR NEGATIVE 05/28/2022 1152   PROTEINUR NEGATIVE 05/28/2022 1152   UROBILINOGEN 0.2 06/15/2014 1038   UROBILINOGEN 1.0 05/04/2013 1637   NITRITE NEGATIVE 05/28/2022 1152   LEUKOCYTESUR NEGATIVE 05/28/2022 1152    Radiological Exams on Admission: I have personally reviewed images CT ANGIO HEAD NECK W WO CM (CODE STROKE)  Result Date: 07/26/2022 CLINICAL DATA:  66 year old female code stroke presentation. EXAM: CT ANGIOGRAPHY HEAD AND NECK TECHNIQUE: Multidetector CT imaging of the head and neck was performed using the standard protocol during bolus administration of intravenous contrast. Multiplanar CT image reconstructions and MIPs were obtained to evaluate the vascular anatomy. Carotid stenosis measurements (when applicable) are obtained utilizing NASCET criteria, using the distal internal carotid diameter as the denominator. RADIATION DOSE REDUCTION: This exam was performed according to the departmental dose-optimization program which includes automated exposure control, adjustment of the mA and/or kV according to patient size and/or use of iterative reconstruction technique. CONTRAST:  36m OMNIPAQUE IOHEXOL 350 MG/ML SOLN COMPARISON:  CTA head and neck 12/29/2020. FINDINGS: CTA NECK Skeleton: Absent dentition and chronic straightening of cervical lordosis.  Congenital incomplete segmentation of C2-C3. No acute osseous abnormality identified. Upper chest: Stable mild apical lung scarring and/or atelectasis. Negative visible superior mediastinum. Other neck: Stable, negative. Aortic arch: 3 vessel arch configuration. Chronic soft and calcified arch atherosclerosis appears stable from last year. Right carotid system: Soft brachiocephalic artery plaque without stenosis. Patent right CCA origin. Proximal right CCA soft plaque appears stable without stenosis. Patent right carotid bifurcation. Chronic soft plaque in the posterior proximal right ICA without stenosis. Left carotid system: Stable soft plaque most pronounced at the left ICA origin. No significant stenosis. Vertebral arteries: Soft and calcified plaque in the proximal right subclavian artery is stable. Right vertebral artery origin remains patent without stenosis. Dominant right vertebral artery is stable and patent to the skull base without stenosis. Proximal left subclavian artery moderate soft plaque with less than 50% stenosis appears stable. Non dominant left vertebral artery origin remains patent and within normal limits. Non dominant left vertebral artery is stable and patent to the skull base. CTA HEAD Posterior circulation: Dominant right vertebral artery remains patent to the vertebrobasilar junction. Mild right V4 irregularity but no significant stenosis. Right PICA at arises from the right V3 segment just outside the skull and is patent. Non dominant distal left vertebral artery is stable and patent to the vertebrobasilar junction. Left AICA appears dominant. Patent basilar artery with mild tortuosity, no significant stenosis. Left SCA and bilateral PCA origins are patent and within normal limits. Right SCA chronically not visualized. Posterior communicating arteries are diminutive or absent. Chronic bilateral PCA branch irregularity appears moderate in the right P2 segment and somewhat progressed from  last year (series 10, image 21). Moderate right P3 and mild left P2/P3 irregularity and stenosis appears stable. Anterior circulation: Both ICA siphons are patent. Only mild siphon plaque. No siphon stenosis. Patent carotid termini, MCA and ACA origins. Dominant right ACA A1 redemonstrated. Anterior communicating artery and  bilateral ACA branches remain patent. Mild to moderate bilateral A2 segment irregularity and stenosis appears stable since last year (series 12, image 16), most pronounced in the distal left A2 segment. Left MCA M1 segment and bifurcation remain patent without stenosis. Left MCA branches are stable and relatively normal. Normal right MCA origin. But moderate irregularity in the distal M1 segment to the level of the bifurcation is chronic but progressed from last year (series 11, image 19). Despite this the right MCA bifurcation is patent. And there is similar progressed moderate irregularity and stenosis of the dominant posterior M2 branch (series 12, image 10). No discrete right MCA branch occlusion identified. Venous sinuses: Early contrast timing, grossly patent. Anatomic variants: Dominant right vertebral artery. Dominant right ACA A1. Review of the MIP images confirms the above findings Salient Right MCA findings were discussed by telephone with Dr. Jefferey Pica 07/26/2022 at 05:16 . IMPRESSION: 1. Negative for large vessel occlusion. 2. But positive for intracranial atherosclerosis, with some progression since the CTA last year: - increased moderate irregularity and stenosis of the distal Right M1 and dominant posterior M2 branch. - similar increased and moderate right PCA P2 stenosis. - stable mild to moderate bilateral ACA A2 and Left PCA irregularity and stenosis. 3. No extracranial hemodynamically significant stenosis despite atherosclerotic plaque. 4. Aortic Atherosclerosis (ICD10-I70.0). Electronically Signed   By: Genevie Ann M.D.   On: 07/26/2022 05:27   CT HEAD CODE STROKE WO  CONTRAST  Result Date: 07/26/2022 CLINICAL DATA:  Code stroke.  66 year old female. EXAM: CT HEAD WITHOUT CONTRAST TECHNIQUE: Contiguous axial images were obtained from the base of the skull through the vertex without intravenous contrast. RADIATION DOSE REDUCTION: This exam was performed according to the departmental dose-optimization program which includes automated exposure control, adjustment of the mA and/or kV according to patient size and/or use of iterative reconstruction technique. COMPARISON:  Brain MRI 12/29/2020.  Head CT 05/28/2022. FINDINGS: Brain: Chronic small-vessel ischemia. Cystic encephalomalacia most pronounced in the anterior right basal ganglia and/or deep white matter capsules. But heterogeneity throughout the bilateral deep gray nuclei. Right brainstem Wallerian degeneration. Chronic right superior cerebellar infarct. No midline shift, ventriculomegaly, mass effect, evidence of mass lesion, intracranial hemorrhage or evidence of cortically based acute infarction. Vascular: No suspicious intracranial vascular hyperdensity. Skull: Stable.  No acute osseous abnormality identified. Sinuses/Orbits: Visualized paranasal sinuses and mastoids are stable and well aerated. Other: No acute orbit or scalp soft tissue finding. ASPECTS [facility] Stroke Program Early CT Score) Total score (0-10 with 10 being normal): 10 IMPRESSION: 1. Advanced chronic small-vessel disease appears stable by CT since July. No acute cortically based infarct or acute intracranial hemorrhage identified. ASPECTS 10. 2. These results were communicated to Dr. Lorrin Goodell at 4:59 am on 07/26/2022 by text page via the [facility] messaging system. Electronically Signed   By: Genevie Ann M.D.   On: 07/26/2022 04:59    EKG: My personal interpretation of EKG shows: no EKG    Assessment/Plan Principal Problem:   Stroke [facility]) Active Problems:   Left hemiplegia (HCC)   Smoking   Essential hypertension    Assessment and Plan: * Stroke  [facility]) Admit to observation med telemetry bed.  Check MRI brain.  Check echo.  Check lipid panel.  Start aspirin and Plavix.  Neuro team to follow.  PT, OT, ST consults.  Left hemiplegia ([place]) Likely due to CVA. Will need PT/OT.  Essential hypertension Allow for permissive hypertension.  Keep systolic blood pressure less than 353 and diastolic less than 614  Smoking Start nicotine patch 14 mg/day.   DVT prophylaxis: SQ Heparin Code Status: Full Code Family Communication: no family at bedside  Disposition Plan: return home vs SNF  Consults called: neurology. Khaliqdina  Admission status: Observation, Telemetry bed   Kristopher Oppenheim, DO [facility] 07/26/2022, 6:04 AM

## 2022-07-26 NOTE — Assessment & Plan Note (Signed)
Allow for permissive hypertension.  Keep systolic blood pressure less than 004 and diastolic less than 849

## 2022-07-27 ENCOUNTER — Observation Stay (HOSPITAL_COMMUNITY): Payer: Medicare HMO

## 2022-07-27 DIAGNOSIS — F419 Anxiety disorder, unspecified: Secondary | ICD-10-CM | POA: Diagnosis present

## 2022-07-27 DIAGNOSIS — Z79899 Other long term (current) drug therapy: Secondary | ICD-10-CM | POA: Diagnosis not present

## 2022-07-27 DIAGNOSIS — F17200 Nicotine dependence, unspecified, uncomplicated: Secondary | ICD-10-CM | POA: Diagnosis not present

## 2022-07-27 DIAGNOSIS — I69322 Dysarthria following cerebral infarction: Secondary | ICD-10-CM | POA: Diagnosis not present

## 2022-07-27 DIAGNOSIS — R1312 Dysphagia, oropharyngeal phase: Secondary | ICD-10-CM | POA: Diagnosis not present

## 2022-07-27 DIAGNOSIS — I639 Cerebral infarction, unspecified: Secondary | ICD-10-CM | POA: Diagnosis not present

## 2022-07-27 DIAGNOSIS — I6381 Other cerebral infarction due to occlusion or stenosis of small artery: Secondary | ICD-10-CM | POA: Diagnosis present

## 2022-07-27 DIAGNOSIS — R1313 Dysphagia, pharyngeal phase: Secondary | ICD-10-CM | POA: Diagnosis present

## 2022-07-27 DIAGNOSIS — R32 Unspecified urinary incontinence: Secondary | ICD-10-CM | POA: Diagnosis present

## 2022-07-27 DIAGNOSIS — F32A Depression, unspecified: Secondary | ICD-10-CM | POA: Diagnosis present

## 2022-07-27 DIAGNOSIS — R059 Cough, unspecified: Secondary | ICD-10-CM | POA: Diagnosis not present

## 2022-07-27 DIAGNOSIS — Z8673 Personal history of transient ischemic attack (TIA), and cerebral infarction without residual deficits: Secondary | ICD-10-CM | POA: Diagnosis not present

## 2022-07-27 DIAGNOSIS — J439 Emphysema, unspecified: Secondary | ICD-10-CM | POA: Diagnosis present

## 2022-07-27 DIAGNOSIS — I69392 Facial weakness following cerebral infarction: Secondary | ICD-10-CM | POA: Diagnosis not present

## 2022-07-27 DIAGNOSIS — Z7902 Long term (current) use of antithrombotics/antiplatelets: Secondary | ICD-10-CM | POA: Diagnosis not present

## 2022-07-27 DIAGNOSIS — Z83438 Family history of other disorder of lipoprotein metabolism and other lipidemia: Secondary | ICD-10-CM | POA: Diagnosis not present

## 2022-07-27 DIAGNOSIS — I69354 Hemiplegia and hemiparesis following cerebral infarction affecting left non-dominant side: Secondary | ICD-10-CM | POA: Diagnosis present

## 2022-07-27 DIAGNOSIS — K7460 Unspecified cirrhosis of liver: Secondary | ICD-10-CM | POA: Diagnosis present

## 2022-07-27 DIAGNOSIS — R29810 Facial weakness: Secondary | ICD-10-CM | POA: Diagnosis present

## 2022-07-27 DIAGNOSIS — E785 Hyperlipidemia, unspecified: Secondary | ICD-10-CM | POA: Diagnosis not present

## 2022-07-27 DIAGNOSIS — I739 Peripheral vascular disease, unspecified: Secondary | ICD-10-CM | POA: Diagnosis present

## 2022-07-27 DIAGNOSIS — G8194 Hemiplegia, unspecified affecting left nondominant side: Secondary | ICD-10-CM | POA: Diagnosis present

## 2022-07-27 DIAGNOSIS — I10 Essential (primary) hypertension: Secondary | ICD-10-CM | POA: Diagnosis not present

## 2022-07-27 DIAGNOSIS — G4700 Insomnia, unspecified: Secondary | ICD-10-CM | POA: Diagnosis present

## 2022-07-27 DIAGNOSIS — R067 Sneezing: Secondary | ICD-10-CM | POA: Diagnosis not present

## 2022-07-27 DIAGNOSIS — R471 Dysarthria and anarthria: Secondary | ICD-10-CM | POA: Diagnosis present

## 2022-07-27 DIAGNOSIS — R29711 NIHSS score 11: Secondary | ICD-10-CM | POA: Diagnosis present

## 2022-07-27 DIAGNOSIS — K5901 Slow transit constipation: Secondary | ICD-10-CM | POA: Diagnosis not present

## 2022-07-27 DIAGNOSIS — R1310 Dysphagia, unspecified: Secondary | ICD-10-CM | POA: Diagnosis present

## 2022-07-27 DIAGNOSIS — K5900 Constipation, unspecified: Secondary | ICD-10-CM | POA: Diagnosis not present

## 2022-07-27 DIAGNOSIS — K219 Gastro-esophageal reflux disease without esophagitis: Secondary | ICD-10-CM | POA: Diagnosis not present

## 2022-07-27 DIAGNOSIS — F17210 Nicotine dependence, cigarettes, uncomplicated: Secondary | ICD-10-CM | POA: Diagnosis present

## 2022-07-27 DIAGNOSIS — Z823 Family history of stroke: Secondary | ICD-10-CM | POA: Diagnosis not present

## 2022-07-27 DIAGNOSIS — I672 Cerebral atherosclerosis: Secondary | ICD-10-CM | POA: Diagnosis present

## 2022-07-27 DIAGNOSIS — I69391 Dysphagia following cerebral infarction: Secondary | ICD-10-CM | POA: Diagnosis not present

## 2022-07-27 DIAGNOSIS — I69320 Aphasia following cerebral infarction: Secondary | ICD-10-CM | POA: Diagnosis not present

## 2022-07-27 DIAGNOSIS — Z8249 Family history of ischemic heart disease and other diseases of the circulatory system: Secondary | ICD-10-CM | POA: Diagnosis not present

## 2022-07-27 DIAGNOSIS — Z7982 Long term (current) use of aspirin: Secondary | ICD-10-CM | POA: Diagnosis not present

## 2022-07-27 HISTORY — DX: Cerebral infarction, unspecified: I63.9

## 2022-07-27 LAB — COMPREHENSIVE METABOLIC PANEL
ALT: 11 U/L (ref 0–44)
AST: 16 U/L (ref 15–41)
Albumin: 3.5 g/dL (ref 3.5–5.0)
Alkaline Phosphatase: 84 U/L (ref 38–126)
Anion gap: 8 (ref 5–15)
BUN: 6 mg/dL — ABNORMAL LOW (ref 8–23)
CO2: 23 mmol/L (ref 22–32)
Calcium: 9.1 mg/dL (ref 8.9–10.3)
Chloride: 108 mmol/L (ref 98–111)
Creatinine, Ser: 0.9 mg/dL (ref 0.44–1.00)
GFR, Estimated: >60 mL/min (ref >60)
Glucose, Bld: 96 mg/dL (ref 70–99)
Potassium: 4 mmol/L (ref 3.5–5.1)
Sodium: 139 mmol/L (ref 135–145)
Total Bilirubin: 0.7 mg/dL (ref 0.3–1.2)
Total Protein: 6.9 g/dL (ref 6.5–8.1)

## 2022-07-27 LAB — CBC
HCT: 41.8 % (ref 36.0–46.0)
Hemoglobin: 13.9 g/dL (ref 12.0–15.0)
MCH: 30.1 pg (ref 26.0–34.0)
MCHC: 33.3 g/dL (ref 30.0–36.0)
MCV: 90.5 fL (ref 80.0–100.0)
Platelets: 251 10*3/uL (ref 150–400)
RBC: 4.62 MIL/uL (ref 3.87–5.11)
RDW: 14.5 % (ref 11.5–15.5)
WBC: 7.5 10*3/uL (ref 4.0–10.5)
nRBC: 0 % (ref 0.0–0.2)

## 2022-07-27 LAB — GLUCOSE, CAPILLARY
Glucose-Capillary: 78 mg/dL (ref 70–99)
Glucose-Capillary: 131 mg/dL — ABNORMAL HIGH (ref 70–99)
Glucose-Capillary: 105 mg/dL — ABNORMAL HIGH (ref 70–99)

## 2022-07-27 MED ORDER — ALPRAZOLAM 0.5 MG PO TABS
0.5000 mg | ORAL_TABLET | Freq: Once | ORAL | Status: AC
  Administered 2022-07-27: 0.5 mg via ORAL
  Filled 2022-07-27: qty 1

## 2022-07-27 NOTE — Progress Notes (Signed)
STROKE TEAM PROGRESS NOTE   INTERVAL HISTORY Patient evaluated at bedside this morning, resting comfortably. Speech is limited, patient writes her answers at times. Currently on nectar thick liquids, confirms she had apple sauce for breakfast. Discussed stroke findings and echo results,has no present concerns or acute complaints. Is amenable to treatment plan. Was pleasant and cooperative throughout the evaluation. There is significant weakness and diminished sensation of the left side of her body, including V2, V3, LUE, and LLE.   Vitals:   07/27/22 0000 07/27/22 0400 07/27/22 0718 07/27/22 1105  BP: (!) 153/69 (!) 142/86 (!) 156/91 (!) 163/89  Pulse:   83 87  Resp:   16 18  Temp: 98.1 F (36.7 C) 98.2 F (36.8 C) 98.1 F (36.7 C) 97.9 F (36.6 C)  TempSrc: Oral Oral Oral Oral  SpO2: 100% 98% 99% 97%  Weight:      Height:       CBC:  Recent Labs  Lab 07/26/22 0444 07/26/22 0448 07/27/22 1130  WBC 11.2*  --  7.5  NEUTROABS 8.1*  --   --   HGB 13.7 13.9 13.9  HCT 41.5 41.0 41.8  MCV 92.0  --  90.5  PLT 247  --  409   Basic Metabolic Panel:  Recent Labs  Lab 07/26/22 0444 07/26/22 0448  NA 141 142  K 3.6 3.6  CL 110 108  CO2 23  --   GLUCOSE 102* 101*  BUN 12 13  CREATININE 1.18* 1.00  CALCIUM 9.7  --    Lipid Panel:  Recent Labs  Lab 07/26/22 0909  CHOL 185  TRIG 97  HDL 41  CHOLHDL 4.5  VLDL 19  LDLCALC 125*   HgbA1c:  Recent Labs  Lab 07/26/22 0444  HGBA1C 5.4   Urine Drug Screen: No results for input(s): "LABOPIA", "COCAINSCRNUR", "LABBENZ", "AMPHETMU", "THCU", "LABBARB" in the last 168 hours.  Alcohol Level  Recent Labs  Lab 07/26/22 0444  ETH <10    IMAGING past 24 hours ECHOCARDIOGRAM COMPLETE  Result Date: 07/26/2022    ECHOCARDIOGRAM REPORT   Patient Name:   Kara Bailey Date of Exam: 07/26/2022 Medical Rec #:  [id number]        Height:       66.0 in Accession #:    [id number]       Weight:       140.0 lb Date of Birth:  1955-12-31          BSA:          1.719 m Patient Age:    66 years         BP:           167/73 mmHg Patient Gender: F                HR:           89 bpm. Exam Location:  Inpatient Procedure: 2D Echo, Color Doppler, Cardiac Doppler and Intracardiac            Opacification Agent Indications:    Stroke  History:        Patient has prior history of Echocardiogram examinations, most                 recent 12/30/2020. Stroke, Arrythmias:Tachycardia and PVC,                 Signs/Symptoms:Chest Pain and Murmur; Risk Factors:Hypertension.  Sonographer:    Memory Argue Referring Phys: [id number] [place]  1. Left ventricular ejection fraction, by estimation, is >75%. Left ventricular ejection fraction by PLAX is 75 %. The left ventricle has hyperdynamic function. The left ventricle has no regional wall motion abnormalities. There is mild left ventricular  hypertrophy. Left ventricular diastolic parameters are consistent with Grade I diastolic dysfunction (impaired relaxation).  2. Right ventricular systolic function is normal. The right ventricular size is normal. Tricuspid regurgitation signal is inadequate for assessing PA pressure.  3. Left atrial size was mildly dilated.  4. The mitral valve is grossly normal. Trivial mitral valve regurgitation.  5. The aortic valve is tricuspid. Aortic valve regurgitation is not visualized. Aortic valve sclerosis/calcification is present, without any evidence of aortic stenosis. Aortic valve mean gradient measures 8.0 mmHg.  6. The inferior vena cava is normal in size with greater than 50% respiratory variability, suggesting right atrial pressure of 3 mmHg. Comparison(s): Changes from prior study are noted. 12/30/2020: LVEF >75%, negative bubble study for interatrial shunt. FINDINGS  Left Ventricle: Left ventricular ejection fraction, by estimation, is >75%. Left ventricular ejection fraction by PLAX is 75 %. The left ventricle has hyperdynamic function. The left ventricle has no  regional wall motion abnormalities. The left ventricular internal cavity size was normal in size. There is mild left ventricular hypertrophy. Left ventricular diastolic parameters are consistent with Grade I diastolic dysfunction (impaired relaxation). Indeterminate filling pressures. Right Ventricle: The right ventricular size is normal. No increase in right ventricular wall thickness. Right ventricular systolic function is normal. Tricuspid regurgitation signal is inadequate for assessing PA pressure. Left Atrium: Left atrial size was mildly dilated. Right Atrium: Right atrial size was normal in size. Pericardium: There is no evidence of pericardial effusion. Mitral Valve: The mitral valve is grossly normal. Trivial mitral valve regurgitation. Tricuspid Valve: The tricuspid valve is normal in structure. Tricuspid valve regurgitation is not demonstrated. Aortic Valve: The aortic valve is tricuspid. Aortic valve regurgitation is not visualized. Aortic valve sclerosis/calcification is present, without any evidence of aortic stenosis. Aortic valve mean gradient measures 8.0 mmHg. Aortic valve peak gradient measures 11.8 mmHg. Aortic valve area, by VTI measures 2.23 cm. Pulmonic Valve: The pulmonic valve was normal in structure. Pulmonic valve regurgitation is not visualized. Aorta: The aortic root and ascending aorta are structurally normal, with no evidence of dilitation. Venous: The inferior vena cava is normal in size with greater than 50% respiratory variability, suggesting right atrial pressure of 3 mmHg. IAS/Shunts: The interatrial septum was not well visualized.  LEFT VENTRICLE PLAX 2D LV EF:         Left            Diastology                ventricular     LV e' medial:    6.20 cm/s                ejection        LV E/e' medial:  8.2                fraction by     LV e' lateral:   4.79 cm/s                PLAX is 75      LV E/e' lateral: 10.6                %. LVIDd:         3.50 cm LVIDs:         2.00 cm  LV  PW:         1.00 cm LV IVS:        1.20 cm LVOT diam:     1.80 cm LV SV:         80 LV SV Index:   46 LVOT Area:     2.54 cm  RIGHT VENTRICLE RV S prime:     12.00 cm/s TAPSE (M-mode): 1.8 cm LEFT ATRIUM             Index        RIGHT ATRIUM           Index LA Vol (A2C):   37.7 ml 21.94 ml/m  RA Area:     10.00 cm LA Vol (A4C):   32.3 ml 18.80 ml/m  RA Volume:   19.50 ml  11.35 ml/m LA Biplane Vol: 34.8 ml 20.25 ml/m  AORTIC VALVE AV Area (Vmax):    2.41 cm AV Area (Vmean):   2.18 cm AV Area (VTI):     2.23 cm AV Vmax:           172.00 cm/s AV Vmean:          131.000 cm/s AV VTI:            0.358 m AV Peak Grad:      11.8 mmHg AV Mean Grad:      8.0 mmHg LVOT Vmax:         163.00 cm/s LVOT Vmean:        112.000 cm/s LVOT VTI:          0.314 m LVOT/AV VTI ratio: 0.88  AORTA Ao Root diam: 3.00 cm MITRAL VALVE MV Area (PHT): 2.92 cm    SHUNTS MV Decel Time: 260 msec    Systemic VTI:  0.31 m MV E velocity: 50.60 cm/s  Systemic Diam: 1.80 cm MV A velocity: 97.70 cm/s MV E/A ratio:  0.52 Lyman Bishop MD Electronically signed by Lyman Bishop MD Signature Date/Time: 07/26/2022/4:37:39 PM    Final     --PHYSICAL EXAM-- Constitutional: Thin elderly woman, in no acute distress.  HEENT: Normocephalic, Normal conjunctiva, anicteric. Hearing grossly intact. No nasal discharge. Neck: Neck is supple. No masses or thyromegaly. Resp: Respirations are non-labored. No wheezing. Skin: Warm. No rashes or ulcers.  Neuro Mental Status AAOx3  Language: Comprehension and naming intact   Cranial Nerves II: Pupils equal and reactive to light, no VF deficits III, IV, VI: Normal oropharynx and mucosa. Normal external appearance of ears and nose.  V: normal sensation in V1, V2, and V3 segments bilaterally   VII: Left facial droop VIII: Grossly normal hearing acuity, no reported changes in balance or coordination. IX, X: normal palatal elevation, no uvular deviation XI: 5/5 head turn and 5/5 shoulder shrug  bilaterally XII: midline tongue protrusion  Motor Muscle: Bulk poor, Tone normal Strength R UE: 5/5 L UE: 1/5 R LE: 5/5 L LE: 0-1/5  Sensory  Diminished sensation to light touch in LUE and LLE Intact sensation on RUE and RLE  Cerebellar: - Finger to Nose unable to do with LLE but arm sways a lot concerning for ataxia. - Heel to shin with ataxia in LLE and hardly able to do - Rapid alternating movement are slowed in LUE and LLE. - Gait: Deferred for patient safety given L sided weakness.   ASSESSMENT/PLAN Kara Bailey is a 66 y.o. female with history of emphysema, PAD, prior L thalamic stroke, shingle, smoker who presents  to ED on 07/26/2022 for L sided weakness that began day prior to admission at [facility], admitted for suspicion of small vessel stroke and is found to have chronic small vessel ischemia of right corona radiate and posterior right lentiform on imaging. Patient examined this morning, there is some diminished strength of the left side, there are subtle muscle contraction noted at UE and LE.   Stroke: Patchy acute on chronic small vessel ischemia of R corona radiata and posterior R lentiform Etiology: Likely chronic small vessel disease  Code Stroke CT head No acute abnormality. Advanced chronic small vessel disease. Atrophy. ASPECTS 10.   CTA head & neck : No large vessel occlusion.  Intracranial atherosclerosis with progression from previous CT in 2022 Moderate irregularity and stenosis of the distal Right M1 and dominant posterior M2 branch Increased, moderate right PCA P2 stenosis Mild-moderate bilateral ACA A2 and Left PCA irregularity and stenosis. Aortic Atherosclerosis MRI:  Patchy acute on chronic small vessel ischemia in the right corona radiata, tracking toward the posterior right lentiform. Stable chronic small vessel disease Chronic hemorrhagic superior cerebellar infarct 2D Echo: LVEF >75% and some mild dilation of L atrium LDL 125 HgbA1c 5.4 VTE  prophylaxis - Lovenox 40 mg injection    Diet   Diet NPO time specified   aspirin 81 mg daily and clopidogrel 75 mg daily prior to admission, now continued on admission. Therapy recommendations:  pending Disposition:  pending  Other Stroke Risk Factors Advanced Age >/= 82  Cigarette smoker, advised to stop smoking Hx stroke  Hypertension Home meds:  none Stable Permissive hypertension in first 24 hours <220/110. Hold meds Long-term BP goal normotensive  Hyperlipidemia Home meds:  lipitor 80, zetia 10 resumed in hospital LDL 125, goal < 70 Continue statin at discharge  Depression/Anxiety Home Remeron, bupropion held for now Nectar thick liquids  Emphysema Albuterol inhaler Q6Hr PRN  GERD Protonix 40 mg injection  Hospital day # 0  Christene Slates, MD PGY-1   To contact Stroke Continuity provider, please refer to [email or web address] After hours, contact General Neurology

## 2022-07-27 NOTE — TOC Initial Note (Signed)
Transition of Care [facility]) - Initial/Assessment Note    Patient Details  Name: Kara Bailey MRN: [id number] Date of Birth: 05-12-1956  Transition of [facility]) CM/SW Contact:    Pollie Friar, RN Phone Number: 07/27/2022, 2:43 PM  Clinical Narrative:                 CM met with the patient and her SO at the bedside. Pt non verbal during visit. SO says she was verbal prior but did have some slurred speech.  SO lives with her but is in a motorized wheelchair. He states he can not assist her physically after d/c. Pt does have a daughter that they see occasionally.  SO oversees pts medications at home.  They both were driving.  Current recommendations are for CIR. Awaiting CIR eval.  TOC following.  Expected Discharge Plan: IP Rehab Facility Barriers to Discharge: Continued Medical Work up   Patient Goals and CMS Choice   CMS Medicare.gov Compare Post Acute Care list provided to:: Patient Choice offered to / list presented to : Patient, Spouse  Expected Discharge Plan and Services Expected Discharge Plan: [place]   Discharge Planning Services: CM Consult Post Acute Care Choice: IP Rehab Living arrangements for the past 2 months: Apartment                                      Prior Living Arrangements/Services Living arrangements for the past 2 months: Apartment Lives with:: Spouse Patient language and need for interpreter reviewed:: Yes Do you feel safe going back to the place where you live?: Yes          Current home services: DME (cane/ walker) Criminal Activity/Legal Involvement Pertinent to Current Situation/Hospitalization: No - Comment as needed  Activities of Daily Living      Permission Sought/Granted                  Emotional Assessment Appearance:: Appears stated age         Psych Involvement: No (comment)  Admission diagnosis:  Stroke [facility]) [I63.9] Acute ischemic stroke [facility]) [I63.9] Patient Active Problem List    Diagnosis Date Noted   Stroke ([place]) 07/26/2022   Left hemiplegia ([place]) 07/26/2022   Acute CVA (cerebrovascular accident) ([place]) 12/29/2020   Peripheral arterial disease ([place]) 12/20/2020   History of esophageal stricture 07/08/2020   Insomnia 12/02/2017   Chronic cough 08/14/2017   Poor appetite 08/29/2016   Peptic stricture of esophagus    History of colonic polyps    Hiatal hernia 03/16/2016   Dysphagia 03/16/2016   Alcohol abuse 10/26/2015   Essential hypertension 09/26/2015   PAC (premature atrial contraction) 10/11/2014   PVC (premature ventricular contraction) 10/11/2014   Back muscle spasm 06/15/2014   Generalized anxiety disorder 06/15/2014   Smoking 06/15/2014   Chronic hepatitis C without hepatic coma ([place]) 05/28/2013   PCP:  Andree Moro, DO Pharmacy:   [facility] [place], [name] [place] [address] Phone: [phone] Fax: [phone]  [facility] [address] [place] [address] Phone: [phone] Fax: [phone]     Social Determinants of Health (SDOH) Interventions    Readmission Risk Interventions     No data to display

## 2022-07-27 NOTE — Evaluation (Signed)
Speech Language Pathology Evaluation Patient Details Name: Kara Bailey MRN: [id number] DOB: Oct 06, 1956 Today's Date: 07/27/2022 Time: 2694-8546 SLP Time Calculation (min) (ACUTE ONLY): 39 min  Problem List:  Patient Active Problem List   Diagnosis Date Noted   Stroke ([place]) 07/26/2022   Left hemiplegia ([place]) 07/26/2022   Acute CVA (cerebrovascular accident) ([place]) 12/29/2020   Peripheral arterial disease ([place]) 12/20/2020   History of esophageal stricture 07/08/2020   Insomnia 12/02/2017   Chronic cough 08/14/2017   Poor appetite 08/29/2016   Peptic stricture of esophagus    History of colonic polyps    Hiatal hernia 03/16/2016   Dysphagia 03/16/2016   Alcohol abuse 10/26/2015   Essential hypertension 09/26/2015   PAC (premature atrial contraction) 10/11/2014   PVC (premature ventricular contraction) 10/11/2014   Back muscle spasm 06/15/2014   Generalized anxiety disorder 06/15/2014   Smoking 06/15/2014   Chronic hepatitis C without hepatic coma ([place]) 05/28/2013   Past Medical History:  Past Medical History:  Diagnosis Date   Anxiety    Chest pain 08/2014   unspecified   Cirrhosis ([place])    Depression    Dizziness 08/2014   Dyspnea 08/2014   Dysrhythmia    Emphysema lung ([facility])    GERD (gastroesophageal reflux disease)    Heart murmur    Hepatitis C    Hiatal hernia    Hypertension    PAC (premature atrial contraction) 10/11/2014   PAD (peripheral artery disease) ([facility])    Palpitations    PVC (premature ventricular contraction) 10/11/2014   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   Stroke ([place])    just slurred speech   Tachycardia 08/2014   Ventral hernia    Weakness 08/2014   Past Surgical History:  Past Surgical History:  Procedure Laterality Date   ABDOMINAL AORTOGRAM W/LOWER EXTREMITY N/A 07/13/2021   Procedure: ABDOMINAL AORTOGRAM W/LOWER EXTREMITY;  Surgeon: Lorretta Harp, MD;  Location: [place] [facility] LAB;  Service: Cardiovascular;   Laterality: N/A;   ABDOMINAL AORTOGRAM W/LOWER EXTREMITY N/A 03/19/2022   Procedure: ABDOMINAL AORTOGRAM W/LOWER EXTREMITY;  Surgeon: Lorretta Harp, MD;  Location: [place] CV LAB;  Service: Cardiovascular;  Laterality: N/A;   APPENDECTOMY     CATARACT EXTRACTION, BILATERAL     COLONOSCOPY     COLONOSCOPY WITH PROPOFOL N/A 04/30/2016   Procedure: COLONOSCOPY WITH PROPOFOL;  Surgeon: Daneil Dolin, MD;  [facility];  Service: Endoscopy;  Laterality: N/A;  [place] N/A 10/13/2021   Procedure: OPEN REPAIR EPIGASTRIC HERNIA WITH MESH PATCH;  Surgeon: Armandina Gemma, MD;  Location: [facility] ORS;  Service: General;  Laterality: N/A;   ESOPHAGOGASTRODUODENOSCOPY     approximately 2010   ESOPHAGOGASTRODUODENOSCOPY (EGD) WITH PROPOFOL N/A 04/30/2016   Procedure: ESOPHAGOGASTRODUODENOSCOPY (EGD) WITH PROPOFOL;  Surgeon: Daneil Dolin, MD;  [facility];  Service: Endoscopy;  Laterality: N/A;   LEFT HEART CATHETERIZATION WITH CORONARY ANGIOGRAM N/A 10/29/2014   07-14-20- pt denies this Procedure: LEFT HEART CATHETERIZATION WITH CORONARY ANGIOGRAM;  Surgeon: Burnell Blanks, MD;  Location: [facility] C[facility] LAB;  Service: Cardiovascular;  Laterality: N/A;   MALONEY DILATION N/A 04/30/2016   Procedure: Venia Minks DILATION;  Surgeon: Daneil Dolin, MD;  [facility];  Service: Endoscopy;  Laterality: N/A;   PERIPHERAL VASCULAR INTERVENTION  07/13/2021   Procedure: PERIPHERAL VASCULAR INTERVENTION;  Surgeon: Lorretta Harp, MD;  Location: [place] CV LAB;  Service: Cardiovascular;;  left SFA left external iliac   PERIPHERAL  VASCULAR INTERVENTION Right 03/19/2022   Procedure: PERIPHERAL VASCULAR INTERVENTION;  Surgeon: Lorretta Harp, MD;  Location: [place] CV LAB;  Service: Cardiovascular;  Laterality: Right;  SFA   POLYPECTOMY  04/30/2016   Procedure: POLYPECTOMY;  Surgeon: Daneil Dolin, MD;  [facility];  Service: Endoscopy;;  Sigmoid  colon polyp removed via hot snare   UPPER GASTROINTESTINAL ENDOSCOPY     HPI:  66 yo female adm to [facility] with left HP and fall.  MRI showed Patchy acute on chronic small vessel ischemia in the right corona  radiata, tracking toward the posterior right lentiform. No  associated hemorrhage or mass effect.  Pt has h/o CVA with dysarthria, dysphagia.  She failed [facility] swallow screen and SLP eval ordered.  Pt admits to some history of dysphagia - coughing with intake and sensing pills sticking in her esophagus.  H/O esophageal dilatation in 2021 - which she reported helped her swallowing.   Assessment / Plan / Recommendation Clinical Impression  Patient presents with intact expressive and receptive language skills and severe mixed dysarthria - with potential motor planning deficits as well.  She is able to write beautifully for communication - allowing her to meet her needs and express her feelings.   Single word with bilabial sounds intelligibility is approx 30% overall - increasing to 50% with effort to slow rate and within known context is largely effective.  Pt answered complex yes/no questions, followed 3 step directions adequately.  She demonstrates excellent awareness to her deficits - writing concern for speech and fall risk with left HP.  Pt is an excellent candidiate for CIR if family can provide 24/7 assist as she is very motivated.  Using teach back, educated pt findings/recommendations and posted signs for staff to have pt write for communication.  Will follow for dysarthria and dysphagia management.  Thanks for this consult.  Using teach back, pt able to demonstrate effective compensation strategy for articulation of single words *within context as able* and write for communication.    SLP Assessment  SLP Recommendation/Assessment: Patient needs continued Speech [place] [facility] SLP Visit Diagnosis: Dysarthria and anarthria (R47.1)    Recommendations for follow up therapy are one  component of a multi-disciplinary discharge planning process, led by the attending physician.  Recommendations may be updated based on patient status, additional functional criteria and insurance authorization.    Follow Up Recommendations  Acute inpatient rehab (3hours/day)    Assistance Recommended at Discharge  Frequent or constant Supervision/Assistance  Functional Status Assessment Patient has had a recent decline in their functional status and demonstrates the ability to make significant improvements in function in a reasonable and predictable amount of time.  Frequency and Duration min 2x/week  2 weeks      SLP Evaluation Cognition  Overall Cognitive Status: Within Functional Limits for tasks assessed Arousal/Alertness: Awake/alert Orientation Level: Oriented to place;Oriented to person;Oriented to situation Year: 2023 Month: September Day of Week: Other (Comment) (did not ask) Memory: Appears intact (for tasks during evaluation - prospective recall - slp to obtain pt pen at end of test) Awareness: Appears intact Problem Solving: Appears intact Safety/Judgment: Appears intact Comments: Pt wrote that she can't stand and was worried she may fall with PT/OT - showing excellent awareness to her defictis       Comprehension  Auditory Comprehension Overall Auditory Comprehension: Appears within functional limits for tasks assessed Yes/No Questions: Within Functional Limits (10/10 basic and complex) Commands: Impaired One Step Basic Commands: Other (comment) (  100%) Two Step Basic Commands: Other (comment) (100%) Conversation: Complex Visual Recognition/Discrimination Discrimination: Not tested Reading Comprehension Reading Status: Unable to assess (comment) (pt has glasses at home - significant other, [name], to bring them in for her; she was able to read her own handwriting)    Expression Expression Primary Mode of Expression: Verbal (But currently nonverbal written due to  her level of dysarthria) Verbal Expression Overall Verbal Expression: Impaired Initiation: No impairment Repetition:  (DNT) Naming: No impairment Pragmatics: No impairment Interfering Components: Speech intelligibility Non-Verbal Means of Communication: Writing Written Expression Dominant Hand: Right Written Expression: Within Functional Limits   Oral / Motor  Oral Motor/Sensory Function Overall Oral Motor/Sensory Function: Moderate impairment Facial ROM: Reduced left;Suspected CN VII (facial) dysfunction Facial Symmetry: Abnormal symmetry left;Suspected CN VII (facial) dysfunction Facial Strength: Reduced left;Suspected CN VII (facial) dysfunction Facial Sensation: Reduced left;Suspected CN V (Trigeminal) dysfunction Lingual ROM: Suspected CN XII (hypoglossal) dysfunction Lingual Symmetry: Within Functional Limits Lingual Strength: Reduced;Suspected CN XII (hypoglossal) dysfunction Lingual Sensation: Reduced;Suspected CN VII (facial) dysfunction-anterior 2/3 tongue Velum: Other (comment) (unable to view) Mandible: Impaired Motor Speech Overall Motor Speech: Impaired Respiration: Impaired Level of Impairment: Word Phonation: Low vocal intensity;Breathy Articulation: Impaired Level of Impairment: Word Intelligibility: Intelligibility reduced Word: 0-24% accurate Phrase: 0-24% accurate Sentence: Not tested Conversation: Not tested Motor Planning: Not tested (gross dysarthria impacts ability to test motor planning) Effective Techniques: Slow rate;Over-articulate (stating single word)            Macario Golds 07/27/2022, 10:52 AM Kathleen Lime, MS [facility] SLP Acute Rehab Services Office [phone] Pager [phone]

## 2022-07-27 NOTE — Plan of Care (Signed)

## 2022-07-27 NOTE — Progress Notes (Signed)
Inpatient Rehab Admissions Coordinator:  ? ?Per therapy recommendations,  patient was screened for CIR candidacy by Cindy Brindisi, MS, CCC-SLP. At this time, Pt. Appears to be a a potential candidate for CIR. I will place   order for rehab consult per protocol for full assessment. Please contact me any with questions. ? ?Clista Rainford, MS, CCC-SLP ?Rehab Admissions Coordinator  ?[phone] (celll) ?[phone] (office) ? ?

## 2022-07-27 NOTE — Progress Notes (Signed)
Inpatient Rehab Admissions Coordinator:   I met with pt. And family to discuss potential CIR admit. She is interested, family can provide support. I will open insurance case once PT note is in.   Clemens Catholic, [place], [place] Admissions Coordinator  [phone] ([place]) [phone] (office)

## 2022-07-27 NOTE — Progress Notes (Addendum)
Kara Bailey  PROGRESS NOTE  Kara Bailey NWG:[id number] DOB: [birth year] DOA: 07/26/2022 PCP: Andree Moro, DO   Brief HPI:    66 year old female with a history of prior stroke, hypertension, tobacco abuse came to ED with dysarthria and left-sided weakness.  CT head showed advanced chronic small vessel ischemic disease.  CTA head and neck was negative for LVO.  Patient does have some intracranial atherosclerosis with increased stenosis of distal right M1 and posterior M2 branches.  Neurology was consulted, recommended stroke work-up.   Subjective   Patient is aphasic, communicates by writing.  Says that her left leg is weak.   Assessment/Plan:   Stroke -MRI brain obtained today shows no acute infarct but shows acute on chronic small vessel ischemia in the right corona radiata; CTA head and neck showed no large vessel occlusion -Echocardiogram showed EF of greater than 57%, grade 1 diastolic dysfunction -Continue aspirin and Plavix -Lipid profile shows LDL 125, continue atorvastatin, Zetia -Hemoglobin A1c is 5.4 -Neuro following   Left hemiplegia -Secondary to CVA -PT/OT consult   Essential hypertension -Allow for permissive hypertension  Dysphagia -Obtained swallow evaluation -She is a risk for aspiration -Started on dysphagia 1 diet    Tobacco abuse    Medications     aspirin  81 mg Oral Daily   aspirin  300 mg Rectal Daily   atorvastatin  80 mg Oral Daily   buPROPion ER  150 mg Oral q AM   clopidogrel  75 mg Oral Daily   enoxaparin (LOVENOX) injection  40 mg Subcutaneous Q24H   ezetimibe  10 mg Oral Daily   mirtazapine  30 mg Oral QHS   pantoprazole (PROTONIX) IV  40 mg Intravenous Q24H   sodium chloride flush  3 mL Intravenous Once     Data Reviewed:   CBG:  Recent Labs  Lab 07/26/22 0443 07/26/22 0617 07/26/22 2355 07/27/22 0640 07/27/22 1205  GLUCAP 104* 107* 100* 78 131*    SpO2: 97 %    Vitals:   07/27/22 0000 07/27/22 0400  07/27/22 0718 07/27/22 1105  BP: (!) 153/69 (!) 142/86 (!) 156/91 (!) 163/89  Pulse:   83 87  Resp:   16 18  Temp: 98.1 F (36.7 C) 98.2 F (36.8 C) 98.1 F (36.7 C) 97.9 F (36.6 C)  TempSrc: Oral Oral Oral Oral  SpO2: 100% 98% 99% 97%  Weight:      Height:          Data Reviewed:  Basic Metabolic Panel: Recent Labs  Lab 07/26/22 0444 07/26/22 0448 07/27/22 1130  NA 141 142 139  K 3.6 3.6 4.0  CL 110 108 108  CO2 23  --  23  GLUCOSE 102* 101* 96  BUN 12 13 6*  CREATININE 1.18* 1.00 0.90  CALCIUM 9.7  --  9.1    CBC: Recent Labs  Lab 07/26/22 0444 07/26/22 0448 07/27/22 1130  WBC 11.2*  --  7.5  NEUTROABS 8.1*  --   --   HGB 13.7 13.9 13.9  HCT 41.5 41.0 41.8  MCV 92.0  --  90.5  PLT 247  --  251    LFT Recent Labs  Lab 07/26/22 0444 07/27/22 1130  AST 18 16  ALT 13 11  ALKPHOS 84 84  BILITOT 0.5 0.7  PROT 7.1 6.9  ALBUMIN 3.8 3.5     Antibiotics: Anti-infectives (From admission, onward)    None        DVT prophylaxis: Lovenox  Code  Status: Full code  Family Communication: No family at bedside   CONSULTS neurology   Objective    Physical Examination:   General-appears in no acute distress Heart-S1-S2, regular, no murmur auscultated Lungs-clear to auscultation bilaterally, no wheezing or crackles auscultated Abdomen-soft, nontender, no organomegaly Extremities-no edema in the lower extremities Neuro-alert, oriented x3, hemiplegia on left side, aphasia  Status is: Inpatient:        [name]   [facility] If 7PM-7AM, please contact night-coverage at [email or web address], Office  [phone]   07/27/2022, 2:24 PM  LOS: 0 days

## 2022-07-27 NOTE — Evaluation (Signed)
Physical Therapy Evaluation Patient Details Name: Kara Bailey MRN: [id number] DOB: 04-12-1956 Today's Date: 07/27/2022  History of Present Illness  65 y.o. female presneting to [facility] ED with L sided weaqkness and slurred speech. 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 PMH significant for prior L thalamic CVA, HTN, tobacco abuse, PAD, and emphysema.  Clinical Impression  Patient presents with decreased mobility due to deficits listed in PT problem list.  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  She states her daughter can give support at d/c.  Patient needing mod/max A +2 for standing transfer to recliner at bedside with assist for L LE progression and stability in stance.  L UE is flaccid.  She is a great candidate for acute inpatient rehab at d/c.  PT will continue to follow acutely.        Recommendations for follow up therapy are one component of a multi-disciplinary discharge planning process, led by the attending physician.  Recommendations may be updated based on patient status, additional functional criteria and insurance authorization.  Follow Up Recommendations Acute inpatient rehab (3hours/day)      Assistance Recommended at Discharge Frequent or constant Supervision/Assistance  Patient can return home with the following  Assistance with cooking/housework;A lot of help with bathing/dressing/bathroom;A lot of help with walking and/or transfers;Assist for transportation;Help with stairs or ramp for entrance    Equipment Recommendations Other (comment) (TBA)  Recommendations for Other Services  Rehab consult    Functional Status Assessment Patient has had a recent decline in their functional status and demonstrates the ability to make significant improvements in function in a reasonable and  predictable amount of time.     Precautions / Restrictions Precautions Precautions: Fall Precaution Comments: L hemi      Mobility  Bed Mobility Overal bed mobility: Needs Assistance Bed Mobility: Rolling, Sidelying to Sit Rolling: Min assist Sidelying to sit: Mod assist       General bed mobility comments: Rolling toward L. Mod A +t to bring BLE off of bed and raise trunk. Pt following commands to puch LLE off of bed with RLE, but requiring up to min A to execute. Mod A to raise trunk.    Transfers Overall transfer level: Needs assistance Equipment used: 2 person hand held assist Transfers: Sit to/from Stand, Bed to chair/wheelchair/BSC Sit to Stand: Mod assist, +2 physical assistance, +2 safety/equipment, Max assist   Step pivot transfers: Max assist, +2 physical assistance, +2 safety/equipment       General transfer comment: Mod-Max A +2 assist for sit<>stand with LLE blocking. Max A +2 for stand pivot with therapist facilitation of LLE advancement and weight shift    Ambulation/Gait                  Stairs            Wheelchair Mobility    Modified Rankin (Stroke Patients Only) Modified Rankin (Stroke Patients Only) Pre-Morbid Rankin Score: No symptoms Modified Rankin: Severe disability     Balance Overall balance assessment: Needs assistance Sitting-balance support: Single extremity supported, Feet supported Sitting balance-Leahy Scale: Poor Sitting balance - Comments: Requires min guard and frequent min A   Standing balance support: Bilateral upper extremity supported, During functional activity Standing balance-Leahy Scale: Zero Standing balance comment: Requires mod-max A to maintain standing balance.  Pertinent Vitals/Pain Pain Assessment Pain Score: 5  Pain Location: L side, L lower leg Pain Descriptors / Indicators: Sharp, Numbness Pain Intervention(s): Repositioned, Monitored during session     [place] expects to be discharged to:: Private residence Living Arrangements: Spouse/significant other (boyfriend Kara Bailey) Available Help at Discharge: Family Type of Home: Apartment Home Access: Level entry       Home Layout: One level Home Equipment: Tub bench;Hand held Engineering geologist (2 wheels);Adaptive equipment      Prior Function Prior Level of Function : Independent/Modified Independent             Mobility Comments: No AD ADLs Comments: Independent in ADL and IADL. 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     Hand Dominance   Dominant Hand: Right    Extremity/Trunk Assessment   Upper Extremity Assessment Upper Extremity Assessment: Defer to OT evaluation    Lower Extremity Assessment Lower Extremity Assessment: LLE deficits/detail LLE Deficits / Details: AAROM WFL, strength hip flexion 2-/5, knee extension 1/5, ankle DF 2+/5 LLE Sensation: decreased light touch LLE Coordination: decreased gross motor;decreased fine motor       Communication   Communication: Expressive difficulties  Cognition Arousal/Alertness: Awake/alert Behavior During Therapy: WFL for tasks assessed/performed Overall Cognitive Status: Impaired/Different from baseline Area of Impairment: Safety/judgement, Problem solving, Attention                   Current Attention Level: Sustained, Selective (decreased R attention)     Safety/Judgement: Decreased awareness of safety   Problem Solving: Requires verbal cues General Comments: Pt cognition appears to be intact for tasks assessed. Potentially decreased safety awareness/problem solving with new current functional abilities. Decreased use of compensatory techniques, but following all commands and writing meaninful questions on paper to ask RN. Pt with expressive difficulties.        General Comments General comments (skin integrity, edema, etc.): VSS, patient taking  meds via applesauce with RN in the room, planned MBS at 1pm per SLP; using mainly writing to communicate, but staff able to understand some verbal communication as well    Exercises     Assessment/Plan    PT Assessment Patient needs continued PT services  PT Problem List Decreased strength;Decreased mobility;Decreased balance;Decreased knowledge of use of DME;Impaired sensation;Decreased safety awareness;Decreased knowledge of precautions;Decreased cognition;Decreased activity tolerance       PT Treatment Interventions DME instruction;Therapeutic exercise;Gait training;Balance training;Functional mobility training;Therapeutic activities;Patient/family education    PT Goals (Current goals can be found in the Care Plan section)  Acute Rehab PT Goals Patient Stated Goal: to return to independent PT Goal Formulation: With patient Time For Goal Achievement: 08/10/22 Potential to Achieve Goals: Good    Frequency Min 4X/week     Co-evaluation PT/OT/SLP Co-Evaluation/Treatment: Yes Reason for Co-Treatment: For patient/therapist safety;To address functional/ADL transfers PT goals addressed during session: Mobility/safety with mobility;Balance         AM-PAC PT "6 Clicks" Mobility  Outcome Measure Help needed turning from your back to your side while in a flat bed without using bedrails?: A Lot Help needed moving from lying on your back to sitting on the side of a flat bed without using bedrails?: Total Help needed moving to and from a bed to a chair (including a wheelchair)?: Total Help needed standing up from a chair using your arms (e.g., wheelchair or bedside chair)?: Total Help needed to walk in hospital room?: Total Help needed climbing 3-5 steps with a  railing? : Total 6 Click Score: 7    End of Session Equipment Utilized During Treatment: Gait belt Activity Tolerance: Patient tolerated treatment well Patient left: in chair;with call bell/phone within reach;with chair alarm  set Nurse Communication: Mobility status PT Visit Diagnosis: Other abnormalities of gait and mobility (R26.89);Hemiplegia and hemiparesis;Muscle weakness (generalized) (M62.81) Hemiplegia - Right/Left: Left Hemiplegia - dominant/non-dominant: Non-dominant Hemiplegia - caused by: Cerebral infarction    Time: 1130-1154 PT Time Calculation (min) (ACUTE ONLY): 24 min   Charges:   PT Evaluation $PT Eval Moderate Complexity: 1 Mod          Cyndi Eural Holzschuh, PT Acute Rehabilitation Services Office:[phone] 07/27/2022   Reginia Naas 07/27/2022, 6:17 PM

## 2022-07-27 NOTE — Evaluation (Signed)
Clinical/Bedside Swallow Evaluation Patient Details  Name: Kara Bailey MRN: [id number] Date of Birth: 1956-03-10  Today's Date: 07/27/2022 Time: SLP Start Time (ACUTE ONLY): 0840 SLP Stop Time (ACUTE ONLY): 2683 SLP Time Calculation (min) (ACUTE ONLY): 39 min  Past Medical History:  Past Medical History:  Diagnosis Date   Anxiety    Chest pain 08/2014   unspecified   Cirrhosis ([place])    Depression    Dizziness 08/2014   Dyspnea 08/2014   Dysrhythmia    Emphysema lung (HCC)    GERD (gastroesophageal reflux disease)    Heart murmur    Hepatitis C    Hiatal hernia    Hypertension    PAC (premature atrial contraction) 10/11/2014   PAD (peripheral artery disease) (HCC)    Palpitations    PVC (premature ventricular contraction) 10/11/2014   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   Stroke ([place])    just slurred speech   Tachycardia 08/2014   Ventral hernia    Weakness 08/2014   Past Surgical History:  Past Surgical History:  Procedure Laterality Date   ABDOMINAL AORTOGRAM W/LOWER EXTREMITY N/A 07/13/2021   Procedure: ABDOMINAL AORTOGRAM W/LOWER EXTREMITY;  Surgeon: Lorretta Harp, MD;  Location: [place] [facility];  Service: Cardiovascular;  Laterality: N/A;   ABDOMINAL AORTOGRAM W/LOWER EXTREMITY N/A 03/19/2022   Procedure: ABDOMINAL AORTOGRAM W/LOWER EXTREMITY;  Surgeon: Lorretta Harp, MD;  Location: [place] [facility];  Service: Cardiovascular;  Laterality: N/A;   APPENDECTOMY     CATARACT EXTRACTION, BILATERAL     COLONOSCOPY     COLONOSCOPY WITH PROPOFOL N/A 04/30/2016   Procedure: COLONOSCOPY WITH PROPOFOL;  Surgeon: Daneil Dolin, MD;  [facility];  Service: Endoscopy;  Laterality: N/A;  [place] N/A 10/13/2021   Procedure: OPEN REPAIR EPIGASTRIC HERNIA WITH MESH PATCH;  Surgeon: Armandina Gemma, MD;  Location: [facility];  Service: General;  Laterality: N/A;   ESOPHAGOGASTRODUODENOSCOPY     approximately 2010    ESOPHAGOGASTRODUODENOSCOPY (EGD) WITH PROPOFOL N/A 04/30/2016   Procedure: ESOPHAGOGASTRODUODENOSCOPY (EGD) WITH PROPOFOL;  Surgeon: Daneil Dolin, MD;  [facility]: [facility];  Service: Endoscopy;  Laterality: N/A;   LEFT HEART CATHETERIZATION WITH CORONARY ANGIOGRAM N/A 10/29/2014   07-14-20- pt denies this Procedure: LEFT HEART CATHETERIZATION WITH CORONARY ANGIOGRAM;  Surgeon: Burnell Blanks, MD;  Location[facility] C[facility];  Service: Cardiovascular;  Laterality: N/A;   MALONEY DILATION N/A 04/30/2016   Procedure: Venia Minks DILATION;  Surgeon: Daneil Dolin, MD;  [facility]: [facility];  Service: Endoscopy;  Laterality: N/A;   PERIPHERAL VASCULAR INTERVENTION  07/13/2021   Procedure: PERIPHERAL VASCULAR INTERVENTION;  Surgeon: Lorretta Harp, MD;  Location: [place] [facility];  Service: Cardiovascular;;  left SFA left external iliac   PERIPHERAL VASCULAR INTERVENTION Right 03/19/2022   Procedure: PERIPHERAL VASCULAR INTERVENTION;  Surgeon: Lorretta Harp, MD;  Location: [place] [facility];  Service: Cardiovascular;  Laterality: Right;  SFA   POLYPECTOMY  04/30/2016   Procedure: POLYPECTOMY;  Surgeon: Daneil Dolin, MD;  [facility];  Service: Endoscopy;;  Sigmoid colon polyp removed via hot snare   UPPER GASTROINTESTINAL ENDOSCOPY     HPI:  66 yo female adm to [facility] with left HP and fall.  MRI showed Patchy acute on chronic small vessel ischemia in the right corona  radiata, tracking toward the posterior right lentiform. No  associated hemorrhage or mass effect.  Pt has h/o CVA with dysarthria, dysphagia.  She failed [facility] swallow screen and SLP eval ordered.  Pt admits to some history of dysphagia - coughing with intake and sensing pills sticking in her esophagus.  H/O esophageal dilatation in 2021 - which she reported helped her swallowing.    Assessment / Plan / Recommendation  Clinical Impression  Patient presents with clinical indications concerning for oropharyngeal  dysphagia - CN deficits include hypoglossal, facial and trigeminal - as well as suspected vagus and glossopharyngeal involvement.  Poor labial seal and impaired lingual coordination allows left labial loss and oral retention.  Pharyngeal swallow suspected to be delayed - with potential aspiration of thin and penetration of cracker bolus.    Multiple swallows across all boluses - likely oral retention spilling into pharynx.  Given pt's h/o aspiration from prior CVA 2022, premorbid coughing with intake per pt, current delayed swallow, weak cough and congestion noted with intake- MBS indicated to assure pt on least restrictive diet and proper compensation strategies are in place.   In the interim, recommend pills crushed if large with applesauce and whole if small.  MBS on schedule for approx 1300 - pt agreeable to plan - but does not want to take barium tablet during test. SLP Visit Diagnosis: Dysphagia, oropharyngeal phase (R13.12);Dysphagia, unspecified (R13.10)    Aspiration Risk  Moderate aspiration risk    Diet Recommendation NPO except meds;Ice chips PRN after oral care;Nectar-thick liquid;Other (Comment) (floor stock, pending MBS)   Medication Administration: Whole meds with puree Supervision: Patient able to self feed Compensations: Slow rate;Small sips/bites Postural Changes: Seated upright at 90 degrees;Remain upright for at least 30 minutes after po intake    Other  Recommendations Oral Care Recommendations: Oral care BID    Recommendations for follow up therapy are one component of a multi-disciplinary discharge planning process, led by the attending physician.  Recommendations may be updated based on patient status, additional functional criteria and insurance authorization.  Follow up Recommendations Acute inpatient rehab (3hours/day)      Assistance Recommended at Discharge Frequent or constant Supervision/Assistance  Functional Status Assessment Patient has had a recent decline in  their functional status and demonstrates the ability to make significant improvements in function in a reasonable and predictable amount of time.  Frequency and Duration min 2x/week  2 weeks       Prognosis Prognosis for Safe Diet Advancement: Good      Swallow Study   General Date of Onset: 07/27/22 HPI: 66 yo female adm to [facility] with left HP and fall.  MRI showed Patchy acute on chronic small vessel ischemia in the right corona  radiata, tracking toward the posterior right lentiform. No  associated hemorrhage or mass effect.  Pt has h/o CVA with dysarthria, dysphagia.  She failed [facility] swallow screen and SLP eval ordered.  Pt admits to some history of dysphagia - coughing with intake and sensing pills sticking in her esophagus.  H/O esophageal dilatation in 2021 - which she reported helped her swallowing. Type of Study: Bedside Swallow Evaluation Previous Swallow Assessment: MBS studies x2 - aspiration of thin with ineffective cough, oral transiting issues Diet Prior to this Study: NPO Temperature Spikes Noted: No Respiratory Status: Room air History of Recent Intubation: No Behavior/Cognition: Alert;Cooperative Oral Cavity Assessment: Dry Oral Cavity - Dentition: Edentulous (pt does not use dentures for po intake and dentures are at home) Vision: Functional for self-feeding Self-Feeding Abilities: Able to feed self Patient Positioning: Upright in bed Baseline Vocal Quality: Low vocal intensity Volitional Cough: Weak Volitional Swallow:  Unable to elicit    Oral/Motor/Sensory Function Overall Oral Motor/Sensory Function: Moderate impairment Facial ROM: Reduced left;Suspected CN VII (facial) dysfunction Facial Symmetry: Abnormal symmetry left;Suspected CN VII (facial) dysfunction Facial Strength: Reduced left;Suspected CN VII (facial) dysfunction Facial Sensation: Reduced left;Suspected CN V (Trigeminal) dysfunction Lingual ROM: Suspected CN XII (hypoglossal) dysfunction Lingual  Symmetry: Within Functional Limits Lingual Strength: Reduced;Suspected CN XII (hypoglossal) dysfunction Lingual Sensation: Reduced;Suspected CN VII (facial) dysfunction-anterior 2/3 tongue Velum: Other (comment) (unable to view) Mandible: Impaired   Ice Chips Ice chips: Impaired Presentation: Spoon Oral Phase Impairments: Reduced labial seal;Reduced lingual movement/coordination Oral Phase Functional Implications: Other (comment);Prolonged oral transit   Thin Liquid Thin Liquid: Impaired Presentation: Cup;Self Fed;Spoon;Straw Oral Phase Impairments: Reduced labial seal;Reduced lingual movement/coordination Oral Phase Functional Implications: Left anterior spillage;Prolonged oral transit Pharyngeal  Phase Impairments: Suspected delayed Swallow;Cough - Delayed;Multiple swallows    Nectar Thick Nectar Thick Liquid: Impaired Presentation: Cup;Straw;Spoon;Self Fed Oral Phase Impairments: Reduced lingual movement/coordination;Reduced labial seal Oral phase functional implications: Prolonged oral transit;Left anterior spillage;Left lateral sulci pocketing Pharyngeal Phase Impairments: Suspected delayed Swallow;Multiple swallows   Honey Thick Honey Thick Liquid: Not tested   Puree Puree: Impaired Presentation: Spoon;Self Fed Oral Phase Impairments: Reduced labial seal;Reduced lingual movement/coordination Oral Phase Functional Implications: Prolonged oral transit;Oral residue;Left lateral sulci pocketing;Left anterior spillage Pharyngeal Phase Impairments: Suspected delayed Swallow   Solid     Solid: Impaired Oral Phase Impairments: Reduced lingual movement/coordination;Reduced labial seal;Impaired mastication Oral Phase Functional Implications: Impaired mastication;Prolonged oral transit;Left lateral sulci pocketing;Oral residue Pharyngeal Phase Impairments: Suspected delayed Swallow;Cough - Delayed;Multiple swallows      Macario Golds 07/27/2022,10:38 AM Kathleen Lime, MS [place] Office [phone] Pager [phone]

## 2022-07-27 NOTE — Progress Notes (Signed)
Modified Barium Swallow Progress Note  Patient Details  Name: Kara Bailey MRN: [id number] Date of Birth: Apr 13, 1956  Today's Date: 07/27/2022  Modified Barium Swallow completed.  Full report located under Chart Review in the Imaging Section.  Brief recommendations include the following:  Clinical Impression  Pt presents with an oral more than pharyngeal dysphagia. She has a lot of difficulty achieving labial seal and sucking to get liquids from a cup or straw, ultimately most successful with use of a straw placed on the R side of her mouth. Thicker liquids were more challenging to get up through a straw still. She has transient oral holding as she seems to be trying to initiate posterior transit with liquids spilling under her tongue. Mastication is prolonged, effortful, and incomplete, but oral residue across textures is fairly mild. Her pharyngeal function is relatively intact although there are moments of trace, transient penetration (PAS 2) that seem to be a result more so of oral loss prior to the swallow. This occurs with both thin and nectar thick liquids, so would recommend starting with thin liquids for now. Discussed solids with pt who would like to start with purees for ease of oral preparation and clearance.   Swallow Evaluation Recommendations       SLP Diet Recommendations: Dysphagia 1 (Puree) solids;Thin liquid   Liquid Administration via: Straw (place in R side of mouth)   Medication Administration: Whole meds with puree (crush any larger pills)   Supervision: Full supervision/cueing for compensatory strategies;Staff to assist with self feeding   Compensations: Slow rate;Small sips/bites;Lingual sweep for clearance of pocketing;Monitor for anterior loss   Postural Changes: Seated upright at 90 degrees;Remain semi-upright after after feeds/meals (Comment)   Oral Care Recommendations: Oral care BID        Osie Bond., M.A. [place] Office [phone]  Secure chat preferred  07/27/2022,2:02 PM

## 2022-07-27 NOTE — Evaluation (Signed)
Occupational Therapy Evaluation Patient Details Name: Kara Bailey MRN: [id number] DOB: [birth year] Today's Date: 07/27/2022   History of Present Illness 66 y.o. female presneting to [facility] ED with L sided weaqkness and slurred speech. 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 PMH significant for prior L thalamic CVA, HTN, tobacco abuse, PAD, and emphysema.   Clinical Impression   PTA, pt lived with her boyfriend and was independent. Pt admitted with problem above and limitations listed below. Upon eval, pt performing UB ADL with mod-max A and LB ADL with max A +2. Pt with max difficulty with verbal expression, but able to express self effectively given pen and paper. Pt is R handed. Pt presenting with L hemiplegia, decreased sensation, safety, balance, and strength. Due to significant change in functional status, recommend AIR to optimize safety and independence in ADL and IADL.      Recommendations for follow up therapy are one component of a multi-disciplinary discharge planning process, led by the attending physician.  Recommendations may be updated based on patient status, additional functional criteria and insurance authorization.   Follow Up Recommendations  Acute inpatient rehab (3hours/day)    Assistance Recommended at Discharge Frequent or constant Supervision/Assistance  Patient can return home with the following Two people to help with walking and/or transfers;Two people to help with bathing/dressing/bathroom;Assistance with cooking/housework;Assist for transportation;Help with stairs or ramp for entrance;Direct supervision/assist for financial management;Direct supervision/assist for medications management    Functional Status Assessment  Patient has had a recent decline in their functional status and demonstrates the ability to make significant improvements in function in a reasonable and  predictable amount of time.  Equipment Recommendations  Other (comment) (defer to next venue of care)    Recommendations for Other Services Rehab consult     Precautions / Restrictions Precautions Precautions: Fall Precaution Comments: L hemi Restrictions Weight Bearing Restrictions: No      Mobility Bed Mobility Overal bed mobility: Needs Assistance Bed Mobility: Rolling, Sidelying to Sit Rolling: Min assist Sidelying to sit: Mod assist       General bed mobility comments: Rolling toward L. Mod A +t to bring BLE off of bed and raise trunk. Pt following commands to puch LLE off of bed with RLE, but requiring up to min A to execute. Mod A to raise trunk.    Transfers Overall transfer level: Needs assistance Equipment used: 2 person hand held assist Transfers: Sit to/from Stand, Bed to chair/wheelchair/BSC Sit to Stand: Mod assist, +2 physical assistance, +2 safety/equipment, Max assist     Step pivot transfers: Max assist, +2 physical assistance, +2 safety/equipment     General transfer comment: Mod-Max A +2 assist for sit<>stand with LLE blocking. Max A +2 for stand pivot with therapist facilitation of LLE advancement and weight shift      Balance Overall balance assessment: Needs assistance Sitting-balance support: Single extremity supported, Feet supported Sitting balance-Leahy Scale: Poor Sitting balance - Comments: Requires min guard and frequent min A   Standing balance support: Bilateral upper extremity supported, During functional activity Standing balance-Leahy Scale: Zero Standing balance comment: Requires mod-max A to maintain standing balance.                           ADL either performed or assessed with clinical judgement   ADL Overall ADL's : Needs assistance/impaired Eating/Feeding: NPO Eating/Feeding Details (indicate cue type and reason):  swallow study later today Grooming: Oral care;Moderate assistance;Bed level   Upper Body  Bathing: Moderate assistance;Sitting Upper Body Bathing Details (indicate cue type and reason): Decreased balance in sitting unable to use LUE Lower Body Bathing: Maximal assistance;Sit to/from stand   Upper Body Dressing : Moderate assistance;Sitting   Lower Body Dressing: Maximal assistance;+2 for physical assistance;+2 for safety/equipment;Sit to/from stand Lower Body Dressing Details (indicate cue type and reason): max A to pull up underwear Toilet Transfer: Maximal assistance;+2 for physical assistance;+2 for safety/equipment;Stand-pivot;BSC/3in1 Toilet Transfer Details (indicate cue type and reason): simulated to recliner. Max A for management of LLE and balance Toileting- Clothing Manipulation and Hygiene: Sitting/lateral lean;Moderate assistance;Maximal assistance;+2 for physical assistance       Functional mobility during ADLs: Moderate assistance;Maximal assistance;+2 for physical assistance (sit<>stand and SPT today) General ADL Comments: Pt limited by impaired verbal expression and decreased sensation and use fo L side     Vision Baseline Vision/History: 0 No visual deficits Ability to See in Adequate Light: 0 Adequate Patient Visual Report: No change from baseline Vision Assessment?: No apparent visual deficits Additional Comments: using pen and paper to communicate as pt with max difficulty verbalizing     Perception     Praxis      Pertinent Vitals/Pain Pain Assessment Pain Assessment: 0-10 Pain Score: 5  Pain Location: L side, L lower leg Pain Descriptors / Indicators: Sharp, Numbness Pain Intervention(s): Limited activity within patient's tolerance, Monitored during session     Hand Dominance Right   Extremity/Trunk Assessment Upper Extremity Assessment Upper Extremity Assessment: RUE deficits/detail;LUE deficits/detail RUE Deficits / Details: 4+/5 grip/push/pull. 4-/5 shoulder flexion LUE Deficits / Details: Painful, Significantly decreased ROM. 1 to 2-/5  throughout shoulder. No activation in hand 0/5 LUE Sensation: decreased light touch LUE Coordination: decreased fine motor;decreased gross motor   Lower Extremity Assessment Lower Extremity Assessment: Defer to PT evaluation   Cervical / Trunk Assessment Cervical / Trunk Assessment: Normal   Communication Communication Communication: Expressive difficulties   Cognition Arousal/Alertness: Awake/alert Behavior During Therapy: WFL for tasks assessed/performed Overall Cognitive Status: Impaired/Different from baseline Area of Impairment: Safety/judgement, Problem solving, Attention                   Current Attention Level: Sustained, Selective (decreased attention to R)     Safety/Judgement: Decreased awareness of safety   Problem Solving: Requires verbal cues General Comments: Pt cognition appears to be intact for tasks assessed. Potentially decreased safety awareness/problem solving with new current functional abilities. Decreased use of compensatory techniques, but following all commands and writing meaninful questions on paper to ask RN. Pt with expressive difficulties.     General Comments  VSS    Exercises     Shoulder Instructions      Home Living Family/patient expects to be discharged to:: Private residence Living Arrangements: Spouse/significant other (boyfriend Physicist, medical) Available Help at Discharge: Family Type of Home: Apartment Home Access: Level entry     Home Layout: One level     Bathroom Shower/Tub: Teacher, early years/pre: Handicapped height     Home Equipment: Tub bench;Hand held Engineering geologist (2 wheels);Adaptive equipment Adaptive Equipment: Reacher    Lives With: Significant other    Prior Functioning/Environment Prior Level of Function : Independent/Modified Independent             Mobility Comments: No AD ADLs Comments: Independent in ADL and IADL. 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  OT Problem List: Decreased strength;Decreased range of motion;Decreased activity tolerance;Impaired balance (sitting and/or standing);Decreased coordination;Decreased safety awareness;Decreased knowledge of use of DME or AE;Pain;Impaired UE functional use      OT Treatment/Interventions: Self-care/ADL training;Therapeutic exercise;Therapeutic activities;Cognitive remediation/compensation;Visual/perceptual remediation/compensation;Patient/family education;Balance training;DME and/or AE instruction    OT Goals(Current goals can be found in the care plan section) Acute Rehab OT Goals Patient Stated Goal: Get better OT Goal Formulation: With patient Time For Goal Achievement: 08/10/22 Potential to Achieve Goals: Fair  OT Frequency: Min 2X/week    Co-evaluation              AM-PAC OT "6 Clicks" Daily Activity     Outcome Measure Help from another person eating meals?: Total Help from another person taking care of personal grooming?: A Lot Help from another person toileting, which includes using toliet, bedpan, or urinal?: A Lot Help from another person bathing (including washing, rinsing, drying)?: A Lot Help from another person to put on and taking off regular upper body clothing?: A Lot Help from another person to put on and taking off regular lower body clothing?: A Lot 6 Click Score: 11   End of Session Equipment Utilized During Treatment: Gait belt Nurse Communication: Mobility status  Activity Tolerance: Patient tolerated treatment well Patient left: in chair;with call bell/phone within reach;with chair alarm set  OT Visit Diagnosis: Unsteadiness on feet (R26.81);Muscle weakness (generalized) (M62.81);Pain;Hemiplegia and hemiparesis Hemiplegia - Right/Left: Left Hemiplegia - dominant/non-dominant: Non-Dominant Hemiplegia - caused by: Cerebral infarction Pain - Right/Left: Left Pain - part of body: Leg (side)                Time: 9702-6378 OT Time  Calculation (min): 24 min Charges:  OT General Charges $OT Visit: 1 Visit OT Evaluation $OT Eval Moderate Complexity: 1 Mod  Shanda Howells, OTR/L [facility] Acute Rehabilitation Office: [phone]   Kara Bailey 07/27/2022, 1:06 PM

## 2022-07-28 DIAGNOSIS — I10 Essential (primary) hypertension: Secondary | ICD-10-CM | POA: Diagnosis not present

## 2022-07-28 DIAGNOSIS — G8194 Hemiplegia, unspecified affecting left nondominant side: Secondary | ICD-10-CM | POA: Diagnosis not present

## 2022-07-28 DIAGNOSIS — I639 Cerebral infarction, unspecified: Secondary | ICD-10-CM | POA: Diagnosis not present

## 2022-07-28 LAB — GLUCOSE, CAPILLARY
Glucose-Capillary: 105 mg/dL — ABNORMAL HIGH (ref 70–99)
Glucose-Capillary: 109 mg/dL — ABNORMAL HIGH (ref 70–99)
Glucose-Capillary: 111 mg/dL — ABNORMAL HIGH (ref 70–99)
Glucose-Capillary: 111 mg/dL — ABNORMAL HIGH (ref 70–99)
Glucose-Capillary: 120 mg/dL — ABNORMAL HIGH (ref 70–99)
Glucose-Capillary: 139 mg/dL — ABNORMAL HIGH (ref 70–99)

## 2022-07-28 MED ORDER — DILTIAZEM HCL ER COATED BEADS 180 MG PO CP24
180.0000 mg | ORAL_CAPSULE | Freq: Every day | ORAL | Status: DC
  Administered 2022-07-28 – 2022-08-01 (×5): 180 mg via ORAL
  Filled 2022-07-28 (×5): qty 1

## 2022-07-28 MED ORDER — NICOTINE 21 MG/24HR TD PT24
21.0000 mg | MEDICATED_PATCH | Freq: Every day | TRANSDERMAL | Status: DC
  Administered 2022-07-28 – 2022-08-01 (×5): 21 mg via TRANSDERMAL
  Filled 2022-07-28 (×5): qty 1

## 2022-07-28 NOTE — PMR Pre-admission (Signed)
PMR Admission Coordinator Pre-Admission Assessment  Patient: Kara Bailey is an 66 y.o., female MRN: [id number] DOB: [birth year] Height: _0  (167.6 cm) Weight: 65.6 kg  Insurance Information HMO: ye    PPO:      PCP:      IPA:      80/20:      OTHER:  PRIMARY: [employer] Medicare      Policy#: [id number]      Subscriber: Pt. CM Name: Cecille Rubin       Phone#: [phone] [id number]     Fax#: [phone] Pre-Cert#: [id number]      Employer: *** Benefits:  Phone #: ***     Name: *** Eff. Date: ***     Deduct: ***      Out of Pocket Max: ***      Life Max: *** CIR: ***      SNF: *** Outpatient: ***     Co-Pay: *** Home Health: ***      Co-Pay: *** DME: ***     Co-Pay: *** Providers: *** SECONDARY: ***      Policy#: ***     Phone#: ***  Orpah Melter - DOB [birth year]   Insurance: Humana MCR    Plan Type: Girtha Rm Plus    HMO: YES    Policy: [id number]  Fax: [phone]    Phone: n/a - online at [email or web address]    Eff Date: 01/10/2022- still active    Deductible: does not have one    OOP Max: $8,300 ($788.64 met)         CIR: $370/day co-pay with a max co-pay of $2,220/admission (6 days)   SNF: $0/day co-pay for days 1-20, $196/day co-pay for days 21-100, limited to 100 days/cal yr   Outpatient:  80% coverage; 20% co-insurance   Home Health:  100% coverage   DME: 80% coverage; 20% co-insurance  Financial Counselor: ***      Phone#: ***  The "Data Collection Information Summary" for patients in Inpatient Rehabilitation Facilities with attached "Privacy Act [place] Records" was provided and verbally reviewed with: Patient  Emergency Contact Information Contact Information     Name Relation Home Work Mobile   [place] Significant other [place]   Alyson, Ki [phone]     Singleton,Catherine Sister [phone]     [name] Daughter   [phone]       Current Medical History  Patient Admitting Diagnosis:  CVA  History of Present Illness:   Pt. Is a 66 y.o. female presneting to [facility] ED 07/26/22 with L sided weaqkness and slurred speech. 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 PMH significant for prior L thalamic CVA, HTN, tobacco abuse, PAD, and emphysema.  On arrival temp not recorded.  Blood pressure 151/98. Labs showed showed a creatinine of 1.18, BUN of 12, glucose of 102, rest of her CMP was in normal limits.White count 11.2, hemoglobin 13.7, platelets 247. CT head showed advanced chronic small vessel ischemic disease. CTA head neck was negative for LVO.  She did have some intracranial atherosclerosis with increased stenosis of the distal right M1 and posterior M2 branches. *** PT/OT saw Pt. And recommended CIR to assist return to PLOF.       Complete NIHSS TOTAL: 9  Patient's medical record from [facility] has been reviewed by the rehabilitation admission coordinator and physician.  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 No  Family History   family history includes Alzheimer's disease in her father; CVA in her father; Heart attack in her mother; Heart disease in her mother; Heart failure in her mother; Hyperlipidemia in  her mother; Hypertension in her mother.  Current Medications  Current Facility-Administered Medications:    acetaminophen (TYLENOL) tablet 650 mg, 650 mg, Oral, Q4H PRN **OR** acetaminophen (TYLENOL) 160 MG/5ML solution 650 mg, 650 mg, Per Tube, Q4H PRN **OR** acetaminophen (TYLENOL) suppository 650 mg, 650 mg, Rectal, Q4H PRN, Kristopher Oppenheim, DO   albuterol (PROVENTIL) (2.5 MG/3ML) 0.083% nebulizer solution 2.5 mg, 2.5 mg, Inhalation, Q6H PRN, Carrion-Carrero, Margely, MD   aspirin chewable tablet 81 mg, 81 mg, Oral, Daily, Carrion-Carrero, Margely, MD, 81 mg at 07/28/22 7654   aspirin suppository 300 mg, 300 mg, Rectal, Daily, Rise Patience, MD   atorvastatin (LIPITOR) tablet 80 mg, 80 mg, Oral, Daily, Carrion-Carrero, Margely, MD, 80 mg at 07/28/22 0938   buPROPion (WELLBUTRIN SR) 12 hr tablet 150 mg, 150 mg, Oral, q AM, Carrion-Carrero, Margely, MD, 150 mg at 07/28/22 6503   clopidogrel (PLAVIX) tablet 75 mg, 75 mg, Oral, Daily, Carrion-Carrero, Margely, MD, 75 mg at 07/28/22 0939   enoxaparin (LOVENOX) injection 40 mg, 40 mg, Subcutaneous, Q24H, Carrion-Carrero, Margely, MD, 40 mg at 07/28/22 1232   ezetimibe (ZETIA) tablet 10 mg, 10 mg, Oral, Daily, Carrion-Carrero, Margely, MD, 10 mg at 07/28/22 0939   famotidine (PEPCID) IVPB 20 mg premix, 20 mg, Intravenous, Q24H, Rise Patience, MD, Last Rate: 100 mL/hr at 07/27/22 2227, 20 mg at 07/27/22 2227   mirtazapine (REMERON) tablet 30 mg, 30 mg, Oral, QHS, Carrion-Carrero, Margely, MD, 30 mg at 07/27/22 2225   nicotine (NICODERM CQ - dosed in mg/24 hours) patch 14 mg, 14 mg, Transdermal, Daily PRN, Kristopher Oppenheim, DO   nicotine (NICODERM CQ - dosed in mg/24 hours) patch 21 mg, 21 mg, Transdermal, Daily, Darrick Meigs, Gagan S, MD, 21 mg at 07/28/22 1232   pantoprazole (PROTONIX) injection 40 mg, 40 mg, Intravenous, Q24H, Rise Patience, MD, 40 mg at 07/28/22 5465   sodium chloride flush (NS) 0.9 % injection 3 mL, 3 mL, Intravenous, Once, Kristopher Oppenheim, DO  Patients Current Diet:  Diet Order             DIET - DYS 1 Room service appropriate? No; Fluid consistency: Thin  Diet effective now                   Precautions / Restrictions Precautions Precautions: Fall Precaution Comments: L hemi Restrictions Weight Bearing Restrictions: No   Has the patient had 2 or more falls or a fall with injury in the past year? No  Prior Activity Level Community (5-7x/wk): Pt. active in  the community PTA  Prior Functional Level Self Care: Did the patient need help bathing, dressing, using the toilet or eating? Independent  Indoor Mobility: Did the patient need assistance with walking from room to room (with or without device)? Independent  Stairs: Did the patient need assistance with internal or external stairs (with or without device)? Independent  Functional Cognition: Did the patient need help planning regular tasks such as shopping or remembering to take medications? Independent  Patient Information Are you of Hispanic, Latino/a,or Spanish origin?: A. No, not of Hispanic, Latino/a, or Spanish origin What is your race?: B. Black or African American Do you need or want an interpreter to communicate with a doctor or health care staff?: 0. No  Patient's Response To:  Health Literacy and Transportation Is the patient able to respond to health literacy and transportation needs?: Yes Health Literacy - How often do you need to have someone help you when you read instructions, pamphlets, or other written material from your doctor or pharmacy?: Never In the past 12 months, has lack of transportation kept you from medical appointments or from getting medications?: No In the past 12 months, has lack of transportation kept you from meetings, work, or from getting things needed for daily living?: No  Home Assistive Devices / Equipment Home Equipment: Tub bench, Hand held shower head, Rolling Walker (2 wheels), Adaptive equipment  Prior  Device Use: Indicate devices/aids used by the patient prior to current illness, exacerbation or injury? None of the above  Current Functional Level Cognition  Arousal/Alertness: Awake/alert Overall Cognitive Status: Impaired/Different from baseline Current Attention Level: Sustained, Selective (decreased R attention) Orientation Level: Oriented X4 Safety/Judgement: Decreased awareness of safety General Comments: Pt cognition appears to be intact for tasks assessed. Potentially decreased safety awareness/problem solving with new current functional abilities. Decreased use of compensatory techniques, but following all commands and writing meaninful questions on paper to ask RN. Pt with expressive difficulties. Memory: Appears intact (for tasks during evaluation - prospective recall - slp to obtain pt pen at end of test) Awareness: Appears intact Problem Solving: Appears intact Safety/Judgment: Appears intact Comments: Pt wrote that she can't stand and was worried she may fall with PT/OT - showing excellent awareness to her defictis    Extremity Assessment (includes Sensation/Coordination)  Upper Extremity Assessment: Defer to OT evaluation RUE Deficits / Details: 4+/5 grip/push/pull. 4-/5 shoulder flexion LUE Deficits / Details: Painful, Significantly decreased ROM. 1 to 2-/5 throughout shoulder. No activation in hand 0/5 LUE Sensation: decreased light touch LUE Coordination: decreased fine motor, decreased gross motor  Lower Extremity Assessment: LLE deficits/detail LLE Deficits / Details: AAROM WFL, strength hip flexion 2-/5, knee extension 1/5, ankle DF 2+/5 LLE Sensation: decreased light touch LLE Coordination: decreased gross motor, decreased fine motor    ADLs  Overall ADL's : Needs assistance/impaired Eating/Feeding: NPO Eating/Feeding Details (indicate cue type and reason): swallow study later today Grooming: Oral care, Moderate assistance, Bed level Upper Body Bathing:  Moderate assistance, Sitting Upper Body Bathing Details (indicate cue type and reason): Decreased balance in sitting unable to use LUE Lower Body Bathing: Maximal assistance, Sit to/from stand Upper Body Dressing : Moderate assistance, Sitting Lower Body Dressing: Maximal assistance, +2 for physical assistance, +2 for safety/equipment, Sit to/from stand Lower Body Dressing Details (indicate cue type and reason): max A to pull up underwear Toilet Transfer: Maximal assistance, +2 for physical assistance, +2 for safety/equipment, Stand-pivot, BSC/3in1 Toilet Transfer Details (indicate cue type and reason): simulated to recliner. Max A for  management of LLE and balance Toileting- Clothing Manipulation and Hygiene: Sitting/lateral lean, Moderate assistance, Maximal assistance, +2 for physical assistance Functional mobility during ADLs: Moderate assistance, Maximal assistance, +2 for physical assistance (sit<>stand and SPT today) General ADL Comments: Pt limited by impaired verbal expression and decreased sensation and use fo L side    Mobility  Overal bed mobility: Needs Assistance Bed Mobility: Rolling, Sidelying to Sit Rolling: Min assist Sidelying to sit: Mod assist General bed mobility comments: Rolling toward L. Mod A +t to bring BLE off of bed and raise trunk. Pt following commands to puch LLE off of bed with RLE, but requiring up to min A to execute. Mod A to raise trunk.    Transfers  Overall transfer level: Needs assistance Equipment used: 2 person hand held assist Transfers: Sit to/from Stand, Bed to chair/wheelchair/BSC Sit to Stand: Mod assist, +2 physical assistance, +2 safety/equipment, Max assist Bed to/from chair/wheelchair/BSC transfer type:: Stand pivot Step pivot transfers: Max assist, +2 physical assistance, +2 safety/equipment General transfer comment: Mod-Max A +2 assist for sit<>stand with LLE blocking. Max A +2 for stand pivot with therapist facilitation of LLE  advancement and weight shift    Ambulation / Gait / Stairs / Wheelchair Mobility       Posture / Balance Dynamic Sitting Balance Sitting balance - Comments: Requires min guard and frequent min A Balance Overall balance assessment: Needs assistance Sitting-balance support: Single extremity supported, Feet supported Sitting balance-Leahy Scale: Poor Sitting balance - Comments: Requires min guard and frequent min A Standing balance support: Bilateral upper extremity supported, During functional activity Standing balance-Leahy Scale: Zero Standing balance comment: Requires mod-max A to maintain standing balance.    Special needs/care consideration Skin ***   Previous Home Environment (from acute therapy documentation) Living Arrangements: Spouse/significant other (boyfriend [name])  Lives With: Significant other Available Help at Discharge: Family Type of Home: Apartment Home Layout: One level Home Access: Level entry Bathroom Shower/Tub: Chiropodist: Handicapped height Bathroom Accessibility: Yes How Accessible: Accessible via walker [place]: No  Discharge Living Setting Plans for Discharge Living Setting: Patient's home Type of Home at Discharge: [place]: One level Discharge Home Access: Level entry Entrance Stairs-Rails: None Discharge Bathroom Shower/Tub: [place] unit Discharge Bathroom Toilet: Handicapped height Discharge Bathroom Accessibility: No  [place] Patient Roles: Spouse Contact Information: [phone] Anticipated Caregiver: Wylene Men Caregiver Availability: 24/7 Discharge Plan Discussed with Primary Caregiver: No Is Caregiver In Agreement with Plan?: No Does Caregiver/Family have Issues with Lodging/Transportation while Pt is in Rehab?: No  Goals Patient/Family Goal for Rehab: PT/OT/SLP MIn A Expected length of stay: 18-21 days Pt/Family Agrees to Admission and  willing to participate: Yes Program Orientation Provided & Reviewed with Pt/Caregiver Including Roles  & Responsibilities: Yes  Decrease burden of Care through IP rehab admission: not anticipated  Possible need for SNF placement upon discharge: not anticipated   Patient Condition: I have reviewed medical records from [facility] , spoken with CM, and patient. I met with patient at the bedside for inpatient rehabilitation assessment.  Patient will benefit from ongoing PT, OT, and SLP, can actively participate in 3 hours of therapy a day 5 days of the week, and can make measurable gains during the admission.  Patient will also benefit from the coordinated team approach during an Inpatient Acute Rehabilitation admission.  The patient will receive intensive therapy as well as Rehabilitation physician, nursing, social worker, and care management interventions.  Due to safety,  skin/wound care, disease management, medication administration, pain management, and patient education the patient requires 24 hour a day rehabilitation nursing.  The patient is currently ** with mobility and basic ADLs.  Discharge setting and therapy post discharge at home with home health is anticipated.  Patient has agreed to participate in the Acute Inpatient Rehabilitation Program and will admit {Time; today/tomorrow:10263}.  Preadmission Screen Completed By:  Genella Mech, 07/28/2022 1:54 PM ______________________________________________________________________   Discussed status with Dr. Marland Kitchen on *** at *** and received approval for admission today.  Admission Coordinator:  Genella Mech, CCC-SLP, time Marland KitchenSudie Grumbling ***   Assessment/Plan: Diagnosis: Does the need for close, 24 hr/day Medical supervision in concert with the patient's rehab needs make it unreasonable for this patient to be served in a less intensive setting? {yes_no_potentially:3041433} Co-Morbidities requiring supervision/potential complications:  *** Due to {due CH:7981025}, does the patient require 24 hr/day rehab nursing? {yes_no_potentially:3041433} Does the patient require coordinated care of a physician, rehab nurse, PT, OT, and SLP to address physical and functional deficits in the context of the above medical diagnosis(es)? {yes_no_potentially:3041433} Addressing deficits in the following areas: {deficits:3041436} Can the patient actively participate in an intensive therapy program of at least 3 hrs of therapy 5 days a week? {yes_no_potentially:3041433} The potential for patient to make measurable gains while on inpatient rehab is {potential:3041437} Anticipated functional outcomes upon discharge from inpatient rehab: {functional outcomes:304600100} PT, {functional outcomes:304600100} OT, {functional outcomes:304600100} SLP Estimated rehab length of stay to reach the above functional goals is: *** Anticipated discharge destination: {anticipated dc setting:21604} 10. Overall Rehab/Functional Prognosis: {potential:3041437}   MD Signature: ***

## 2022-07-28 NOTE — Progress Notes (Signed)
[facility]  PROGRESS NOTE  Kara Bailey HDQ:[id number] DOB: July 15, 1956 DOA: 07/26/2022 PCP: Andree Moro, DO   Brief HPI:    66 year old female with a history of prior stroke, hypertension, tobacco abuse came to ED with dysarthria and left-sided weakness.  CT head showed advanced chronic small vessel ischemic disease.  CTA head and neck was negative for LVO.  Patient does have some intracranial atherosclerosis with increased stenosis of distal right M1 and posterior M2 branches.  Neurology was consulted, recommended stroke work-up.   Subjective   Patient is aphasic, communicates with writing.  Wants a nicotine patch.   Assessment/Plan:   Stroke -MRI brain obtained today shows no acute infarct but shows acute on chronic small vessel ischemia in the right corona radiata; CTA head and neck showed no large vessel occlusion -Echocardiogram showed EF of greater than 11%, grade 1 diastolic dysfunction -Continue aspirin and Plavix, which she was taking prior to admission -Lipid profile shows LDL 125, continue atorvastatin, Zetia -Hemoglobin A1c is 5.4 -Neuro  has signed off -Patient to go to CIR for rehab   Left hemiplegia -Secondary to CVA -PT/OT consult   Essential hypertension -Antihypertensive medications were held for permissive hypertension for past 2 days -Blood pressure is now elevated -We will restart Cardizem 180 milligram daily, which is patient's home dose  Dysphagia -Obtained swallow evaluation -She is a risk for aspiration -Started on dysphagia 1 diet    Tobacco abuse -Patient wants nicotine patch -We will start nicotine patch 21 mg transdermal daily   Medications     aspirin  81 mg Oral Daily   aspirin  300 mg Rectal Daily   atorvastatin  80 mg Oral Daily   buPROPion ER  150 mg Oral q AM   clopidogrel  75 mg Oral Daily   enoxaparin (LOVENOX) injection  40 mg Subcutaneous Q24H   ezetimibe  10 mg Oral Daily   mirtazapine  30 mg Oral QHS    nicotine  21 mg Transdermal Daily   pantoprazole (PROTONIX) IV  40 mg Intravenous Q24H   sodium chloride flush  3 mL Intravenous Once     Data Reviewed:   CBG:  Recent Labs  Lab 07/27/22 1205 07/27/22 1616 07/28/22 0419 07/28/22 0601 07/28/22 1223  GLUCAP 131* 105* 111* 111* 109*    SpO2: 99 %    Vitals:   07/28/22 0352 07/28/22 0421 07/28/22 0832 07/28/22 1219  BP: (!) 197/80  (!) 162/91 (!) 160/96  Pulse: 82  90 94  Resp: '15  14 15  '$ Temp: 97.8 F (36.6 C)  98.4 F (36.9 C) 98.4 F (36.9 C)  TempSrc: Oral  Oral Oral  SpO2: 100%  100% 99%  Weight:  65.6 kg    Height:          Data Reviewed:  Basic Metabolic Panel: Recent Labs  Lab 07/26/22 0444 07/26/22 0448 07/27/22 1130  NA 141 142 139  K 3.6 3.6 4.0  CL 110 108 108  CO2 23  --  23  GLUCOSE 102* 101* 96  BUN 12 13 6*  CREATININE 1.18* 1.00 0.90  CALCIUM 9.7  --  9.1    CBC: Recent Labs  Lab 07/26/22 0444 07/26/22 0448 07/27/22 1130  WBC 11.2*  --  7.5  NEUTROABS 8.1*  --   --   HGB 13.7 13.9 13.9  HCT 41.5 41.0 41.8  MCV 92.0  --  90.5  PLT 247  --  251    LFT Recent Labs  Lab 07/26/22 0444 07/27/22 1130  AST 18 16  ALT 13 11  ALKPHOS 84 84  BILITOT 0.5 0.7  PROT 7.1 6.9  ALBUMIN 3.8 3.5     Antibiotics: Anti-infectives (From admission, onward)    None        DVT prophylaxis: Lovenox  Code Status: Full code  Family Communication: No family at bedside   CONSULTS neurology   Objective    Physical Examination:   General-appears in no acute distress Heart-S1-S2, regular, no murmur auscultated Lungs-clear to auscultation bilaterally, no wheezing or crackles auscultated Abdomen-soft, nontender, no organomegaly Extremities-no edema in the lower extremities Neuro-alert, oriented x3, hemiplegia, aphasia  Status is: Inpatient:        [name]   [facility] If 7PM-7AM, please contact night-coverage at [email or web address], Office   [phone]   07/28/2022, 3:49 PM  LOS: 1 day

## 2022-07-29 DIAGNOSIS — G8194 Hemiplegia, unspecified affecting left nondominant side: Secondary | ICD-10-CM | POA: Diagnosis not present

## 2022-07-29 DIAGNOSIS — I10 Essential (primary) hypertension: Secondary | ICD-10-CM | POA: Diagnosis not present

## 2022-07-29 DIAGNOSIS — I639 Cerebral infarction, unspecified: Secondary | ICD-10-CM | POA: Diagnosis not present

## 2022-07-29 DIAGNOSIS — F17200 Nicotine dependence, unspecified, uncomplicated: Secondary | ICD-10-CM | POA: Diagnosis not present

## 2022-07-29 LAB — GLUCOSE, CAPILLARY
Glucose-Capillary: 122 mg/dL — ABNORMAL HIGH (ref 70–99)
Glucose-Capillary: 110 mg/dL — ABNORMAL HIGH (ref 70–99)
Glucose-Capillary: 141 mg/dL — ABNORMAL HIGH (ref 70–99)

## 2022-07-29 MED ORDER — ALPRAZOLAM 0.5 MG PO TABS
0.5000 mg | ORAL_TABLET | Freq: Every day | ORAL | Status: DC | PRN
  Administered 2022-07-30 – 2022-07-31 (×2): 0.5 mg via ORAL
  Filled 2022-07-29 (×4): qty 1

## 2022-07-29 NOTE — Progress Notes (Signed)
[facility]  PROGRESS NOTE  Kara Bailey LFY:[id number] DOB: 1956/05/18 DOA: 07/26/2022 PCP: Andree Moro, DO   Brief HPI:    66 year old female with a history of prior stroke, hypertension, tobacco abuse came to ED with dysarthria and left-sided weakness.  CT head showed advanced chronic small vessel ischemic disease.  CTA head and neck was negative for LVO.  Patient does have some intracranial atherosclerosis with increased stenosis of distal right M1 and posterior M2 branches.  Neurology was consulted, recommended stroke work-up.   Subjective   Patient seen and examined, no new complaints.   Assessment/Plan:   Stroke -MRI brain obtained today shows no acute infarct but shows acute on chronic small vessel ischemia in the right corona radiata; CTA head and neck showed no large vessel occlusion -Echocardiogram showed EF of greater than 85%, grade 1 diastolic dysfunction -Continue aspirin and Plavix, which she was taking prior to admission -Lipid profile shows LDL 125, continue atorvastatin, Zetia -Hemoglobin A1c is 5.4 -Neuro  has signed off -Patient to go to CIR for rehab   Left hemiplegia -Secondary to CVA -PT/OT consult   Essential hypertension -Antihypertensive medications were held for permissive hypertension for more than 48 hours  -Blood pressure was starting to rise  -Started back on Cardizem 180 milligram daily, which is patient's home dose  Dysphagia -Obtained swallow evaluation -She is a risk for aspiration -Started on dysphagia 1 diet    Tobacco abuse -Patient wants nicotine patch -Continue nicotine patch 21 mg transdermal daily   Medications     aspirin  81 mg Oral Daily   aspirin  300 mg Rectal Daily   atorvastatin  80 mg Oral Daily   buPROPion ER  150 mg Oral q AM   clopidogrel  75 mg Oral Daily   diltiazem  180 mg Oral Daily   enoxaparin (LOVENOX) injection  40 mg Subcutaneous Q24H   ezetimibe  10 mg Oral Daily   mirtazapine  30 mg Oral  QHS   nicotine  21 mg Transdermal Daily   pantoprazole (PROTONIX) IV  40 mg Intravenous Q24H   sodium chloride flush  3 mL Intravenous Once     Data Reviewed:   CBG:  Recent Labs  Lab 07/28/22 1653 07/28/22 1959 07/28/22 2353 07/29/22 0453 07/29/22 1213  GLUCAP 120* 139* 105* 122* 110*    SpO2: 100 %    Vitals:   07/28/22 2347 07/29/22 0457 07/29/22 0727 07/29/22 1210  BP: (!) 146/78 (!) 143/89 (!) 157/82 (!) 146/89  Pulse: 89 69 97 89  Resp:   17 18  Temp: 98 F (36.7 C) 98 F (36.7 C) 98.1 F (36.7 C) 97.6 F (36.4 C)  TempSrc: Oral Oral Oral Oral  SpO2: 100% 98% 100% 100%  Weight:      Height:          Data Reviewed:  Basic Metabolic Panel: Recent Labs  Lab 07/26/22 0444 07/26/22 0448 07/27/22 1130  NA 141 142 139  K 3.6 3.6 4.0  CL 110 108 108  CO2 23  --  23  GLUCOSE 102* 101* 96  BUN 12 13 6*  CREATININE 1.18* 1.00 0.90  CALCIUM 9.7  --  9.1    CBC: Recent Labs  Lab 07/26/22 0444 07/26/22 0448 07/27/22 1130  WBC 11.2*  --  7.5  NEUTROABS 8.1*  --   --   HGB 13.7 13.9 13.9  HCT 41.5 41.0 41.8  MCV 92.0  --  90.5  PLT 247  --  251    LFT Recent Labs  Lab 07/26/22 0444 07/27/22 1130  AST 18 16  ALT 13 11  ALKPHOS 84 84  BILITOT 0.5 0.7  PROT 7.1 6.9  ALBUMIN 3.8 3.5     Antibiotics: Anti-infectives (From admission, onward)    None        DVT prophylaxis: Lovenox  Code Status: Full code  Family Communication: No family at bedside   CONSULTS neurology   Objective    Physical Examination:  General-appears in no acute distress Heart-S1-S2, regular, no murmur auscultated Lungs-clear to auscultation bilaterally, no wheezing or crackles auscultated Abdomen-soft, nontender, no organomegaly Extremities-no edema in the lower extremities Neuro-alert, oriented x3, left hemiplegia, aphasia  Status is: Inpatient:        [name]   [facility] If 7PM-7AM, please contact night-coverage at  [email or web address], Office  [phone]   07/29/2022, 1:09 PM  LOS: 2 days

## 2022-07-30 DIAGNOSIS — F17200 Nicotine dependence, unspecified, uncomplicated: Secondary | ICD-10-CM | POA: Diagnosis not present

## 2022-07-30 DIAGNOSIS — I639 Cerebral infarction, unspecified: Secondary | ICD-10-CM | POA: Diagnosis not present

## 2022-07-30 DIAGNOSIS — I10 Essential (primary) hypertension: Secondary | ICD-10-CM | POA: Diagnosis not present

## 2022-07-30 DIAGNOSIS — G8194 Hemiplegia, unspecified affecting left nondominant side: Secondary | ICD-10-CM | POA: Diagnosis not present

## 2022-07-30 LAB — GLUCOSE, CAPILLARY
Glucose-Capillary: 121 mg/dL — ABNORMAL HIGH (ref 70–99)
Glucose-Capillary: 181 mg/dL — ABNORMAL HIGH (ref 70–99)
Glucose-Capillary: 91 mg/dL (ref 70–99)
Glucose-Capillary: 92 mg/dL (ref 70–99)

## 2022-07-30 MED ORDER — PANTOPRAZOLE SODIUM 40 MG PO TBEC
40.0000 mg | DELAYED_RELEASE_TABLET | Freq: Every day | ORAL | Status: DC
  Administered 2022-07-31 – 2022-08-01 (×2): 40 mg via ORAL
  Filled 2022-07-30 (×2): qty 1

## 2022-07-30 NOTE — Plan of Care (Signed)

## 2022-07-30 NOTE — Progress Notes (Signed)
[facility]  PROGRESS NOTE  Kara Bailey EAV:[id number] DOB: 1956-05-17 DOA: 07/26/2022 PCP: Andree Moro, DO   Brief HPI:    66 year old female with a history of prior stroke, hypertension, tobacco abuse came to ED with dysarthria and left-sided weakness.  CT head showed advanced chronic small vessel ischemic disease.  CTA head and neck was negative for LVO.  Patient does have some intracranial atherosclerosis with increased stenosis of distal right M1 and posterior M2 branches.  Neurology was consulted, recommended stroke work-up.   Subjective   Patient seen and examined, no new complaints.  Awaiting bed at inpatient rehab.   Assessment/Plan:   Stroke -MRI brain obtained today shows no acute infarct but shows acute on chronic small vessel ischemia in the right corona radiata; CTA head and neck showed no large vessel occlusion -Echocardiogram showed EF of greater than 78%, grade 1 diastolic dysfunction -Continue aspirin and Plavix, which she was taking prior to admission -Lipid profile shows LDL 125, continue atorvastatin, Zetia -Hemoglobin A1c is 5.4 -Neuro  has signed off -Patient to go to CIR for rehab   Left hemiplegia -Secondary to CVA -PT/OT consult   Essential hypertension -Antihypertensive medications were held for permissive hypertension for more than 48 hours  -Blood pressure was starting to rise  -Started back on Cardizem 180 milligram daily, which is patient's home dose  Dysphagia -Obtained swallow evaluation -She is a risk for aspiration -Started on dysphagia 1 diet    Tobacco abuse -Patient wants nicotine patch -Continue nicotine patch 21 mg transdermal daily   Medications     aspirin  81 mg Oral Daily   aspirin  300 mg Rectal Daily   atorvastatin  80 mg Oral Daily   buPROPion ER  150 mg Oral q AM   clopidogrel  75 mg Oral Daily   diltiazem  180 mg Oral Daily   enoxaparin (LOVENOX) injection  40 mg Subcutaneous Q24H   ezetimibe  10 mg  Oral Daily   mirtazapine  30 mg Oral QHS   nicotine  21 mg Transdermal Daily   [START ON 07/31/2022] pantoprazole  40 mg Oral Daily   sodium chloride flush  3 mL Intravenous Once     Data Reviewed:   CBG:  Recent Labs  Lab 07/29/22 1213 07/29/22 1555 07/30/22 0027 07/30/22 0620 07/30/22 1245  GLUCAP 110* 141* 121* 91 181*    SpO2: 96 %    Vitals:   07/30/22 0029 07/30/22 0441 07/30/22 0721 07/30/22 1252  BP: 130/74 (!) 141/73 135/81 131/72  Pulse: 86 85 85 88  Resp: '16 17 16   '$ Temp: 97.9 F (36.6 C) 97.7 F (36.5 C) 98.4 F (36.9 C) (!) 97.4 F (36.3 C)  TempSrc: Oral Oral Oral Oral  SpO2: 100% 99% 100% 96%  Weight:      Height:          Data Reviewed:  Basic Metabolic Panel: Recent Labs  Lab 07/26/22 0444 07/26/22 0448 07/27/22 1130  NA 141 142 139  K 3.6 3.6 4.0  CL 110 108 108  CO2 23  --  23  GLUCOSE 102* 101* 96  BUN 12 13 6*  CREATININE 1.18* 1.00 0.90  CALCIUM 9.7  --  9.1    CBC: Recent Labs  Lab 07/26/22 0444 07/26/22 0448 07/27/22 1130  WBC 11.2*  --  7.5  NEUTROABS 8.1*  --   --   HGB 13.7 13.9 13.9  HCT 41.5 41.0 41.8  MCV 92.0  --  90.5  PLT 247  --  251    LFT Recent Labs  Lab 07/26/22 0444 07/27/22 1130  AST 18 16  ALT 13 11  ALKPHOS 84 84  BILITOT 0.5 0.7  PROT 7.1 6.9  ALBUMIN 3.8 3.5     Antibiotics: Anti-infectives (From admission, onward)    None        DVT prophylaxis: Lovenox  Code Status: Full code  Family Communication: No family at bedside   CONSULTS neurology   Objective    Physical Examination:  General-appears in no acute distress Heart-S1-S2, regular, no murmur auscultated Lungs-clear to auscultation bilaterally, no wheezing or crackles auscultated Abdomen-soft, nontender, no organomegaly Extremities-no edema in the lower extremities Neuro-alert, oriented x3, left hemiplegia, aphasia  Status is: Inpatient:        Kara Bailey   [facility] If 7PM-7AM, please  contact night-coverage at [email or web address], Office  [phone]   07/30/2022, 2:29 PM  LOS: 3 days

## 2022-07-30 NOTE — Progress Notes (Addendum)
Physical Therapy Treatment Patient Details Name: Kara Bailey MRN: [id number] DOB: December 23, 1955 Today's Date: 07/30/2022   History of Present Illness 66 y.o. female presneting to [facility] ED with L sided weaqkness and slurred speech. 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 PMH significant for prior L thalamic CVA, HTN, tobacco abuse, PAD, and emphysema.    PT Comments    Pt required mod assist bed mobility, mod assist sit to stand, and max assist SPT bed to recliner toward R. No active movement noted LUE. Little to no active movement noted LLE. She demonstrates poor sitting balance and zero standing balance. Pt in recliner with feet elevated at end of session. She continues to be an excellent AIR candidate. She is very motivated to participate in therapy. Pt communicated through writing her frustration over call light. Per SLP note, pt is largely aphonic at this time. Spoke with Biomedical engineer about pt's inability to speak when using her call light. Secretary made note to automatically send someone to pt's room when she calls, and notify pt they are on the way.     Recommendations for follow up therapy are one component of a multi-disciplinary discharge planning process, led by the attending physician.  Recommendations may be updated based on patient status, additional functional criteria and insurance authorization.  Follow Up Recommendations  Acute inpatient rehab (3hours/day)     Assistance Recommended at Discharge Frequent or constant Supervision/Assistance  Patient can return home with the following Assistance with cooking/housework;A lot of help with bathing/dressing/bathroom;A lot of help with walking and/or transfers;Assist for transportation;Help with stairs or ramp for entrance   Equipment Recommendations  Other (comment) (TBD)    Recommendations for Other Services Rehab consult     Precautions  / Restrictions Precautions Precautions: Fall;Other (comment) Precaution Comments: L hemi     Mobility  Bed Mobility Overal bed mobility: Needs Assistance Bed Mobility: Supine to Sit     Supine to sit: Mod assist     General bed mobility comments: assist with LUE/LE and to elevate trunk    Transfers Overall transfer level: Needs assistance Equipment used: None Transfers: Sit to/from Stand, Bed to chair/wheelchair/BSC Sit to Stand: Mod assist Stand pivot transfers: Max assist         General transfer comment: assist to power up and stabilize balance. Pivot toward R bed to recliner. Therapist anterior to pt with pt holding therapist's upper arm with RUE for support.    Ambulation/Gait                   Stairs             Wheelchair Mobility    Modified Rankin (Stroke Patients Only)       Balance Overall balance assessment: Needs assistance Sitting-balance support: Single extremity supported, Feet supported Sitting balance-Leahy Scale: Poor Sitting balance - Comments: Pt stabilizing with RUE and requiring min/min guard assist   Standing balance support: Single extremity supported, During functional activity Standing balance-Leahy Scale: Zero Standing balance comment: reliance on external support                            Cognition Arousal/Alertness: Awake/alert Behavior During Therapy: WFL for tasks assessed/performed Overall Cognitive Status: Impaired/Different from baseline Area of Impairment: Safety/judgement, Problem solving, Attention  Current Attention Level: Selective     Safety/Judgement: Decreased awareness of safety   Problem Solving: Difficulty sequencing, Requires verbal cues General Comments: Able to communicate her needs very well by writing.        Exercises      General Comments General comments (skin integrity, edema, etc.): VSS on RA      Pertinent Vitals/Pain Pain  Assessment Pain Assessment: No/denies pain    Home Living                          Prior Function            PT Goals (current goals can now be found in the care plan section) Acute Rehab PT Goals Patient Stated Goal: to return to independent Progress towards PT goals: Progressing toward goals    Frequency    Min 4X/week      PT Plan Current plan remains appropriate    Co-evaluation              AM-PAC PT "6 Clicks" Mobility   Outcome Measure  Help needed turning from your back to your side while in a flat bed without using bedrails?: A Lot Help needed moving from lying on your back to sitting on the side of a flat bed without using bedrails?: A Lot Help needed moving to and from a bed to a chair (including a wheelchair)?: Total Help needed standing up from a chair using your arms (e.g., wheelchair or bedside chair)?: A Lot Help needed to walk in hospital room?: Total Help needed climbing 3-5 steps with a railing? : Total 6 Click Score: 9    End of Session Equipment Utilized During Treatment: Gait belt Activity Tolerance: Patient tolerated treatment well Patient left: in chair;with call bell/phone within reach Nurse Communication: Mobility status PT Visit Diagnosis: Other abnormalities of gait and mobility (R26.89);Hemiplegia and hemiparesis;Muscle weakness (generalized) (M62.81) Hemiplegia - Right/Left: Left Hemiplegia - dominant/non-dominant: Non-dominant Hemiplegia - caused by: Cerebral infarction     Time: 1140-1205 PT Time Calculation (min) (ACUTE ONLY): 25 min  Charges:  $Therapeutic Activity: 23-37 mins                     Lorrin Goodell, PT  Office # [phone] Pager [phone]    Kara Bailey 07/30/2022, 12:34 PM

## 2022-07-30 NOTE — Progress Notes (Signed)
PHARMACIST - PHYSICIAN COMMUNICATION  DR:   Darrick Meigs  CONCERNING: IV to Oral Route Change Policy  RECOMMENDATION: This patient is receiving pantoprazole by the intravenous route.  Based on criteria approved by the Pharmacy and Therapeutics Committee, the intravenous medication(s) is/are being converted to the equivalent oral dose form(s).   DESCRIPTION: These criteria include: The patient is eating (either orally or via tube) and/or has been taking other orally administered medications for a least 24 hours The patient has no evidence of active gastrointestinal bleeding or impaired GI absorption (gastrectomy, short bowel, patient on TNA or NPO).  If you have questions about this conversion, please contact the Pharmacy Department  '[]'$   [phone] )  [facility] '[]'$   [phone] )  [facility] '[x]'$   [phone] )  [facility] '[]'$   [phone] )  [facility] '[]'$   [phone] )  [place], PharmD, BCPS 07/30/2022 12:59 PM

## 2022-07-30 NOTE — Progress Notes (Signed)
Inpatient Rehab Admissions Coordinator:    I continue to await insurance auth for CIR. Pt.'s son states Pt. Can come live with him and that he can provide 24/7 support at d/c if needed. I will continue to pursue insurance auth and follow for potential admit.   Clemens Catholic, [place], [place] Admissions Coordinator  [phone] ([place]) [phone] (office)

## 2022-07-30 NOTE — Care Management Important Message (Signed)
Important Message  Patient Details  Name: Kara Bailey MRN: [id number] Date of Birth: 30-Jun-1956   Medicare Important Message Given:  Yes     Hannah Beat 07/30/2022, 2:25 PM

## 2022-07-30 NOTE — Progress Notes (Signed)
Speech Language Pathology Treatment:  (speech)  Patient Details Name: Kara Bailey MRN: [id number] DOB: 12-04-1955 Today's Date: 07/30/2022 Time: 6195-0932 SLP Time Calculation (min) (ACUTE ONLY): 20 min  Assessment / Plan / Recommendation Clinical Impression  Patient seen by SLP for skilled treatment focused on speech and voice goals. Patient was awake and alert when SLP arrived into room and receptive towards treatment session. She did not make any spontaneous attempts to speak, mouth words or even to write until SLP indicated that she was welcome to write to respond. Patient was largely aphonic during session and only able to vocalize with very low intensity, glottal sound when SLP modeling/cueing. SLP asked about her voice as initial evaluation on 07/27/22 reported dysarthria but not significant difficulties with voicing as was noted during this session. Patient wrote that "My voice was on when I first got here". Patient was able to achieve voicing using 'yawn-sigh' exercise and when cued to breathe in slowly through nose and 'force out' sound. She was able to speak a three word phrase with significant effort in initiating voicing. SLP directed patient through controlled breathing (in through nose and out through mouth). She was able to achieve very mild amount of lip rounding and was able to direct air though lips (used tissue in front of her mouth as visual aide). SLP educated patient on therapy process of needing to work on breath support/control to lead to work on voicing and then speech production. SLP will continue to follow patient with focus on dysphagia and speech/voice.    HPI HPI: 66 yo female adm to [facility] with left hemiparesis and fall.  MRI showed patchy acute on chronic small vessel ischemia in the right corona  radiata, tracking toward the posterior right lentiform. No associated hemorrhage or mass effect.  Pt has h/o CVA with dysarthria, dysphagia. She failed [facility] swallow screen and  SLP eval ordered.  Pt admits to some history of dysphagia - coughing with intake and sensing pills sticking in her esophagus.  H/O esophageal dilatation in 2021 - which she reported helped her swallowing. MBS February 2022 recommending Dys 3 diet and nectar thick liquids with one instance of aspiration (PAS 7) and additional penetration (PAS 3) but with coughing noted clinically. Repeat MBS April 2022 showed improved airway protection (PAS 2) with recommendation for regular solids and thin liquids. PMH includes: anxiety, CP, depression, GERD, HH, stroke, emphysema, PAD, smoker      SLP Plan  Continue with current plan of care      Recommendations for follow up therapy are one component of a multi-disciplinary discharge planning process, led by the attending physician.  Recommendations may be updated based on patient status, additional functional criteria and insurance authorization.    Recommendations  Diet recommendations: Dysphagia 1 (puree);Thin liquid Liquids provided via: Cup;Straw Medication Administration: Whole meds with puree Supervision: Patient able to self feed;Intermittent supervision to cue for compensatory strategies Compensations: Slow rate;Small sips/bites;Lingual sweep for clearance of pocketing;Monitor for anterior loss Postural Changes and/or Swallow Maneuvers: Seated upright 90 degrees                Oral Care Recommendations: Oral care BID Follow Up Recommendations: Acute inpatient rehab (3hours/day) Assistance recommended at discharge: Frequent or constant Supervision/Assistance SLP Visit Diagnosis: Dysarthria and anarthria (R47.1);Aphonia (R49.1) Plan: Continue with current plan of care        Sonia Baller, MA, CCC-SLP Speech Therapy

## 2022-07-31 DIAGNOSIS — G8194 Hemiplegia, unspecified affecting left nondominant side: Secondary | ICD-10-CM | POA: Diagnosis not present

## 2022-07-31 DIAGNOSIS — F17200 Nicotine dependence, unspecified, uncomplicated: Secondary | ICD-10-CM | POA: Diagnosis not present

## 2022-07-31 DIAGNOSIS — I639 Cerebral infarction, unspecified: Secondary | ICD-10-CM | POA: Diagnosis not present

## 2022-07-31 DIAGNOSIS — I10 Essential (primary) hypertension: Secondary | ICD-10-CM | POA: Diagnosis not present

## 2022-07-31 LAB — GLUCOSE, CAPILLARY
Glucose-Capillary: 100 mg/dL — ABNORMAL HIGH (ref 70–99)
Glucose-Capillary: 102 mg/dL — ABNORMAL HIGH (ref 70–99)
Glucose-Capillary: 94 mg/dL (ref 70–99)
Glucose-Capillary: 97 mg/dL (ref 70–99)

## 2022-07-31 LAB — BASIC METABOLIC PANEL
Anion gap: 10 (ref 5–15)
BUN: 10 mg/dL (ref 8–23)
CO2: 23 mmol/L (ref 22–32)
Calcium: 9.8 mg/dL (ref 8.9–10.3)
Chloride: 110 mmol/L (ref 98–111)
Creatinine, Ser: 0.9 mg/dL (ref 0.44–1.00)
GFR, Estimated: >60 mL/min (ref >60)
Glucose, Bld: 93 mg/dL (ref 70–99)
Potassium: 4.1 mmol/L (ref 3.5–5.1)
Sodium: 143 mmol/L (ref 135–145)

## 2022-07-31 LAB — MAGNESIUM: Magnesium: 2.4 mg/dL (ref 1.7–2.4)

## 2022-07-31 NOTE — Progress Notes (Signed)
Speech Language Pathology Treatment: Dysphagia  Patient Details Name: Kara Bailey MRN: [id number] DOB: 1956-06-12 Today's Date: 07/31/2022 Time: 1700-1715 SLP Time Calculation (min) (ACUTE ONLY): 15 min  Assessment / Plan / Recommendation Clinical Impression  RN reached out to SLP secondary to patient having significant difficulty/inability to drink water through straw or cup and patient indicating she was very thirsty. SLP observed patient with first a straw and then cup sip of water, with her directing straw and cup to right side (strong side). She was not able to draw liquid through straw at all and with cup, she exhibited anterior spillage and significant difficulty with anterior to posterior transit. SLP retrieved a cup with handle with lid and small opening and instructed patient to start act of swallowing once liquid enters mouth. Patient did still exhibit some anterior spillage, but she was able to effectively sip and swallow liquid. She indicated that this cup did help her manage liquid intake. SLP will continue to follow patient for dysarthria and dysphagia goals.     HPI HPI: 66 yo female adm to [facility] with left hemiparesis and fall.  MRI showed patchy acute on chronic small vessel ischemia in the right corona  radiata, tracking toward the posterior right lentiform. No associated hemorrhage or mass effect.  Pt has h/o CVA with dysarthria, dysphagia. She failed [facility] swallow screen and SLP eval ordered.  Pt admits to some history of dysphagia - coughing with intake and sensing pills sticking in her esophagus.  H/O esophageal dilatation in 2021 - which she reported helped her swallowing. MBS February 2022 recommending Dys 3 diet and nectar thick liquids with one instance of aspiration (PAS 7) and additional penetration (PAS 3) but with coughing noted clinically. Repeat MBS April 2022 showed improved airway protection (PAS 2) with recommendation for regular solids and thin liquids. PMH includes:  anxiety, CP, depression, GERD, HH, stroke, emphysema, PAD, smoker      SLP Plan  Continue with current plan of care      Recommendations for follow up therapy are one component of a multi-disciplinary discharge planning process, led by the attending physician.  Recommendations may be updated based on patient status, additional functional criteria and insurance authorization.    Recommendations  Diet recommendations: Dysphagia 1 (puree);Thin liquid Liquids provided via: Cup Medication Administration: Whole meds with puree Supervision: Patient able to self feed;Intermittent supervision to cue for compensatory strategies Compensations: Slow rate;Small sips/bites;Lingual sweep for clearance of pocketing;Monitor for anterior loss Postural Changes and/or Swallow Maneuvers: Seated upright 90 degrees                Oral Care Recommendations: Oral care BID Follow Up Recommendations: Acute inpatient rehab (3hours/day) SLP Visit Diagnosis: Dysphagia, unspecified (R13.10) Plan: Continue with current plan of care          Sonia Baller, MA, CCC-SLP Speech Therapy

## 2022-07-31 NOTE — Progress Notes (Signed)
Occupational Therapy Treatment Patient Details Name: Kara Bailey MRN: [id number] DOB: Dec 05, 1955 Today's Date: 07/31/2022   History of present illness 66 y.o. female presneting to [facility] ED with L sided weaqkness and slurred speech. 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 PMH significant for prior L thalamic CVA, HTN, tobacco abuse, PAD, and emphysema.   OT comments  Pt pleasant and extremely motivated to participate in OT/PT session this date. Pt continues to present with L hemiplegia, decreased balance, strength, and safety. Challenging strength and balance with sitting and leaning outside of BOS as well as standing with weight shifts this session in preparation for participation in ADL. Pt requiring min A for sitting balance, and mod A for standing balance. Pt with flaccidity in hand, but LUE maintaining on bar of stedy in standing. Pt performing oral care and washing face with min A sitting in chair at sink. Mod A +2 for sit<>stand with stedy this session. Continue to highly recommend AIR to optimize pt safety and independence in ADL and IADL.    Recommendations for follow up therapy are one component of a multi-disciplinary discharge planning process, led by the attending physician.  Recommendations may be updated based on patient status, additional functional criteria and insurance authorization.    Follow Up Recommendations  Acute inpatient rehab (3hours/day)    Assistance Recommended at Discharge Frequent or constant Supervision/Assistance  Patient can return home with the following  Two people to help with walking and/or transfers;Two people to help with bathing/dressing/bathroom;Assistance with cooking/housework;Assist for transportation;Help with stairs or ramp for entrance;Direct supervision/assist for financial management;Direct supervision/assist for medications management   Equipment  Recommendations  Other (comment) (defer to next venue of care)    Recommendations for Other Services Rehab consult    Precautions / Restrictions Precautions Precautions: Fall;Other (comment) Precaution Comments: L hemi Restrictions Weight Bearing Restrictions: No       Mobility Bed Mobility               General bed mobility comments: In recliner on arrival and departure    Transfers Overall transfer level: Needs assistance Equipment used: 2 person hand held assist (stedy) Transfers: Sit to/from Stand Sit to Stand: Max assist, Mod assist, +2 physical assistance, +2 safety/equipment           General transfer comment: Pt performing sit<>stand from recliner with max A +2. Up to stedy with mod A +2. Weight shifting in stedy with mod A and therapist facilitating at L knee. Pt requiring light mod A to maintain blanace, however, with knee buckling with decreased provision of assistance. Pt with greater ability to achieve upright position this session.     Balance Overall balance assessment: Needs assistance Sitting-balance support: Single extremity supported, Feet supported Sitting balance-Leahy Scale: Poor Sitting balance - Comments: Pt stabilizing with RUE and requiring min A/min guard assist   Standing balance support: Single extremity supported, During functional activity Standing balance-Leahy Scale: Zero Standing balance comment: reliance on external support                           ADL either performed or assessed with clinical judgement   ADL Overall ADL's : Needs assistance/impaired Eating/Feeding: Set up Eating/Feeding Details (indicate cue type and reason): Engaged in self-feeding on arrival, but decreased apetite and reporting motivated to participate in therapy Grooming: Wash/dry face;Oral care;Minimal assistance;Sitting Grooming Details (indicate cue type  and reason): Min A for sitting balance to wash face. Min A to open mouth wash as pt  without dentition                 Toilet Transfer: Moderate assistance;Maximal assistance;+2 for physical assistance;+2 for safety/equipment (stedy) Toilet Transfer Details (indicate cue type and reason): simulated with stedy         Functional mobility during ADLs: Moderate assistance;Maximal assistance;+2 for physical assistance (sit<>stands today and facilitation of weight shifting) General ADL Comments: Impaired by L hemiplegia    Extremity/Trunk Assessment Upper Extremity Assessment Upper Extremity Assessment: LUE deficits/detail LUE Deficits / Details: Painful, Significantly decreased ROM. 1 to 2-/5 throughout shoulder. No activation in hand 0/5 LUE Sensation: decreased light touch LUE Coordination: decreased fine motor;decreased gross motor   Lower Extremity Assessment Lower Extremity Assessment: Defer to PT evaluation        Vision   Vision Assessment?: No apparent visual deficits Additional Comments: Continues to use pen and paper to communicate. Pt additionally locating grooming items sitting in recliner at sink.   Perception Perception Perception: Not tested   Praxis Praxis Praxis: Not tested    Cognition Arousal/Alertness: Awake/alert Behavior During Therapy: WFL for tasks assessed/performed Overall Cognitive Status: Impaired/Different from baseline Area of Impairment: Safety/judgement, Problem solving, Attention                   Current Attention Level: Selective     Safety/Judgement: Decreased awareness of safety   Problem Solving: Difficulty sequencing, Requires verbal cues General Comments: Able to communicate her needs very well by writing. Pt with fear during use of mobility equipment. Following all commands with increased time.        Exercises Exercises: Other exercises Other Exercises Other Exercises: sit<>stand ~5x with extension of L knee, and weight shift L and R. Mod A with use of stedy. Other Exercises: Sitting at edge of  recliner, pt shifting weight L and R on PT command. Pt with impaired sitting balance, requiring min guard-min A at all times. tolerating sititing edge of chair ~3 minutes 2x    Shoulder Instructions       General Comments VSS on RA. HR to 105 in standing. Pt's sister present during session.    Pertinent Vitals/ Pain       Pain Assessment Pain Assessment: No/denies pain Pain Intervention(s): Monitored during session  Home Living                                          Prior Functioning/Environment              Frequency  Min 2X/week        Progress Toward Goals  OT Goals(current goals can now be found in the care plan section)  Progress towards OT goals: Progressing toward goals  Acute Rehab OT Goals Patient Stated Goal: get better OT Goal Formulation: With patient Time For Goal Achievement: 08/10/22 Potential to Achieve Goals: [place] Discharge plan remains appropriate    Co-evaluation    PT/OT/SLP Co-Evaluation/Treatment: Yes Reason for Co-Treatment: For patient/therapist safety;To address functional/ADL transfers PT goals addressed during session: Mobility/safety with mobility;Balance OT goals addressed during session: ADL's and self-care;Proper use of Adaptive equipment and DME      AM-PAC OT "6 Clicks" Daily Activity     Outcome Measure   Help from another person eating meals?: A Little  Help from another person taking care of personal grooming?: A Lot Help from another person toileting, which includes using toliet, bedpan, or urinal?: A Lot Help from another person bathing (including washing, rinsing, drying)?: A Lot Help from another person to put on and taking off regular upper body clothing?: A Lot Help from another person to put on and taking off regular lower body clothing?: A Lot 6 Click Score: 13    End of Session Equipment Utilized During Treatment: Gait belt (stedy)  OT Visit Diagnosis: Unsteadiness on feet  (R26.81);Muscle weakness (generalized) (M62.81);Pain;Hemiplegia and hemiparesis Hemiplegia - Right/Left: Left Hemiplegia - dominant/non-dominant: Non-Dominant Hemiplegia - caused by: Cerebral infarction Pain - Right/Left: Left Pain - part of body: Leg   Activity Tolerance Patient tolerated treatment well   Patient Left in chair;with call bell/phone within reach;with chair alarm set;with family/visitor present   Nurse Communication Mobility status        Time: 2993-7169 OT Time Calculation (min): 37 min  Charges: OT General Charges $OT Visit: 1 Visit OT Treatments $Self Care/Home Management : 8-22 mins  Shanda Howells, OTR/L [facility] Acute Rehabilitation Office: [phone]   Lula Olszewski 07/31/2022, 4:17 PM

## 2022-07-31 NOTE — Progress Notes (Signed)
Physical Therapy Treatment Patient Details Name: Kara Bailey MRN: [id number] DOB: 09-12-1956 Today's Date: 07/31/2022   History of Present Illness Pt is 66 y.o. female who to [facility] ED with L sided weaqkness and slurred speech on 07/26/22. 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 PMH significant for prior L thalamic CVA, HTN, tobacco abuse, PAD, and emphysema.    PT Comments    Pt motivated and making gradual progress.  Pt with good tolerance for activity - participating in multiple transfers, stands, and balance task.  Session focused on sitting and standing balance with visual, verbal, and tactile cues.  Pt initially fearful of STEDY but able to be reassured and became confident in standing. Continues to have 0/5 strength on L, not able to take any steps.    Recommendations for follow up therapy are one component of a multi-disciplinary discharge planning process, led by the attending physician.  Recommendations may be updated based on patient status, additional functional criteria and insurance authorization.  Follow Up Recommendations  Acute inpatient rehab (3hours/day)     Assistance Recommended at Discharge Frequent or constant Supervision/Assistance  Patient can return home with the following Assistance with cooking/housework;A lot of help with bathing/dressing/bathroom;A lot of help with walking and/or transfers;Assist for transportation;Help with stairs or ramp for entrance   Equipment Recommendations  Other (comment) (TBD)    Recommendations for Other Services Rehab consult     Precautions / Restrictions Precautions Precautions: Fall;Other (comment) Precaution Comments: L hemi Restrictions Weight Bearing Restrictions: No     Mobility  Bed Mobility               General bed mobility comments: In recliner on arrival and departure    Transfers Overall transfer level: Needs  assistance Equipment used: 2 person hand held assist (and STEDY) Transfers: Sit to/from Stand Sit to Stand: Mod assist, +2 physical assistance, Min assist           General transfer comment: Performed sit to stand from recliner x 2 with mod A of 2 and cues to scoot forward, assist to rise and support L UE.  Sit to stand x 4 from STEDY with min A of 2 and L UE supported.    Ambulation/Gait                   Stairs             Wheelchair Mobility    Modified Rankin (Stroke Patients Only) Modified Rankin (Stroke Patients Only) Pre-Morbid Rankin Score: No symptoms Modified Rankin: Severe disability     Balance Overall balance assessment: Needs assistance Sitting-balance support: Single extremity supported, Feet supported, No upper extremity supported Sitting balance-Leahy Scale: Poor Sitting balance - Comments: Worked on EOB balance with visual feedback from therapist.  Pt mostly using R UE but was briefly able to place R UE in lap.  Tendency to lean posteriorly and lifts R leg to help compensate.  Verbal and visual cues to sit straight and come to midline.  Worked on leaning R and L with return to midline and regaining balance before attempting another challenge - required R UE and min A for this task.   Standing balance support: Single extremity supported, During functional activity Standing balance-Leahy Scale: Poor Standing balance comment: Pt stood in STEDY x 3 for 1-2 mins at a time.Therapist supported L UE on STEDY.  Verbal and tactile cues to stand straight  and tuck buttock.  Provided assistance to straighten L knee.  Worked on weight shifting with L knee blocked.  Tried to work on flexing L knee and then standing but needing assist to straighten.                            Cognition Arousal/Alertness: Awake/alert Behavior During Therapy: WFL for tasks assessed/performed Overall Cognitive Status: Impaired/Different from baseline Area of  Impairment: Safety/judgement, Problem solving, Attention                   Current Attention Level: Selective     Safety/Judgement: Decreased awareness of safety   Problem Solving: Difficulty sequencing, Requires verbal cues General Comments: Able to communicate her needs very well by writing. Pt with fear during use of mobility equipment -able to be reassured/educated. Following all commands with increased time.        Exercises General Exercises - Lower Extremity Ankle Circles/Pumps: PROM, Left, 15 reps, Seated (Exercises with facilitation techniques and repeated verbal cues) Long Arc Quad: PROM, Left, 15 reps, Seated    General Comments General comments (skin integrity, edema, etc.): VSS on RA. HR to 105 in standing. Pt's sister present during session.      Pertinent Vitals/Pain Pain Assessment Pain Assessment: No/denies pain    Home Living                          Prior Function            PT Goals (current goals can now be found in the care plan section) Progress towards PT goals: Progressing toward goals    Frequency    Min 4X/week      PT Plan Current plan remains appropriate    Co-evaluation   Reason for Co-Treatment: Complexity of the patient's impairments (multi-system involvement);For patient/therapist safety PT goals addressed during session: Mobility/safety with mobility;Balance OT goals addressed during session: ADL's and self-care;Proper use of Adaptive equipment and DME      AM-PAC PT "6 Clicks" Mobility   Outcome Measure  Help needed turning from your back to your side while in a flat bed without using bedrails?: A Lot Help needed moving from lying on your back to sitting on the side of a flat bed without using bedrails?: A Lot Help needed moving to and from a bed to a chair (including a wheelchair)?: Total Help needed standing up from a chair using your arms (e.g., wheelchair or bedside chair)?: A Lot Help needed to walk  in hospital room?: Total Help needed climbing 3-5 steps with a railing? : Total 6 Click Score: 9    End of Session Equipment Utilized During Treatment: Gait belt Activity Tolerance: Patient tolerated treatment well Patient left: in chair;with call bell/phone within reach;with chair alarm set;with family/visitor present Nurse Communication: Mobility status PT Visit Diagnosis: Other abnormalities of gait and mobility (R26.89);Hemiplegia and hemiparesis;Muscle weakness (generalized) (M62.81) Hemiplegia - Right/Left: Left Hemiplegia - dominant/non-dominant: Non-dominant Hemiplegia - caused by: Cerebral infarction     Time: 1440-1519 PT Time Calculation (min) (ACUTE ONLY): 39 min  Charges:  $Neuromuscular Re-education: 23-37 mins                     Abran Richard, PT Acute Rehab [facility] [phone]    Karlton Lemon 07/31/2022, 4:41 PM

## 2022-07-31 NOTE — Progress Notes (Signed)
Inpatient Rehab Admissions Coordinator:    I continue to await insurance auth for CIR admit. I will follow for potential admit pending auth.  Clemens Catholic, [place], [place] Admissions Coordinator  [phone] ([place]) [phone] (office)

## 2022-07-31 NOTE — Progress Notes (Signed)
This nurse was alerted by ECG monitor tech that patient had a 13 beat run of V-tach. Patient was in [place] when this nurse did a focused assessment. Pt reports no SOB, CP, tightness, pain. Vitals charted.

## 2022-07-31 NOTE — Progress Notes (Signed)
[facility]  PROGRESS NOTE  Kara Bailey BHA:[id number] DOB: [birth year] DOA: 07/26/2022 PCP: Kara Moro, DO   Brief HPI:    66 year old female with a history of prior stroke, hypertension, tobacco abuse came to ED with dysarthria and left-sided weakness.  CT head showed advanced chronic small vessel ischemic disease.  CTA head and neck was negative for LVO.  Patient does have some intracranial atherosclerosis with increased stenosis of distal right M1 and posterior M2 branches.  Neurology was consulted, recommended stroke work-up. Patient now awaiting insurance authorization to go to inpatient rehab   Subjective   Patient seen and examined, denies new complaints.  Awaiting insurance authorization to go to CIR.   Assessment/Plan:   Stroke -MRI brain obtained today shows no acute infarct but shows acute on chronic small vessel ischemia in the right corona radiata; CTA head and neck showed no large vessel occlusion -Echocardiogram showed EF of greater than 97%, grade 1 diastolic dysfunction -Continue aspirin and Plavix, which she was taking prior to admission -Lipid profile shows LDL 125, continue atorvastatin, Zetia -Hemoglobin A1c is 5.4 -Neuro  has signed off -Patient to go to CIR for rehab once insurance authorization is received.   Left hemiplegia -Secondary to CVA -PT/OT consult -Pending transfer to CIR   Essential hypertension -Antihypertensive medications were held for permissive hypertension for more than 48 hours  -Blood pressure was starting to rise  -Started back on Cardizem 180 milligram daily, which is patient's home dose  Dysphagia -Obtained swallow evaluation -She is a risk for aspiration -Started on dysphagia 1 diet    Tobacco abuse -Patient wants nicotine patch -Continue nicotine patch 21 mg transdermal daily   Medications     aspirin  81 mg Oral Daily   atorvastatin  80 mg Oral Daily   buPROPion ER  150 mg Oral q AM   clopidogrel  75 mg  Oral Daily   diltiazem  180 mg Oral Daily   enoxaparin (LOVENOX) injection  40 mg Subcutaneous Q24H   ezetimibe  10 mg Oral Daily   mirtazapine  30 mg Oral QHS   nicotine  21 mg Transdermal Daily   pantoprazole  40 mg Oral Daily   sodium chloride flush  3 mL Intravenous Once     Data Reviewed:   CBG:  Recent Labs  Lab 07/30/22 1245 07/30/22 1640 07/31/22 0008 07/31/22 0608 07/31/22 1201  GLUCAP 181* 92 94 100* 97    SpO2: 99 %    Vitals:   07/31/22 0300 07/31/22 0400 07/31/22 0714 07/31/22 1202  BP: (!) 144/79 (!) 157/87 137/76 (!) 143/80  Pulse:  88 83 89  Resp:  '16 16 16  '$ Temp: (!) 97.5 F (36.4 C) (!) 97.4 F (36.3 C) (!) 97.5 F (36.4 C) 98.4 F (36.9 C)  TempSrc: Oral Axillary Oral Oral  SpO2: 98% 100% 99% 99%  Weight:      Height:          Data Reviewed:  Basic Metabolic Panel: Recent Labs  Lab 07/26/22 0444 07/26/22 0448 07/27/22 1130 07/31/22 1027  NA 141 142 139 143  K 3.6 3.6 4.0 4.1  CL 110 108 108 110  CO2 23  --  23 23  GLUCOSE 102* 101* 96 93  BUN 12 13 6* 10  CREATININE 1.18* 1.00 0.90 0.90  CALCIUM 9.7  --  9.1 9.8  MG  --   --   --  2.4    CBC: Recent Labs  Lab 07/26/22 0444  07/26/22 0448 07/27/22 1130  WBC 11.2*  --  7.5  NEUTROABS 8.1*  --   --   HGB 13.7 13.9 13.9  HCT 41.5 41.0 41.8  MCV 92.0  --  90.5  PLT 247  --  251    LFT Recent Labs  Lab 07/26/22 0444 07/27/22 1130  AST 18 16  ALT 13 11  ALKPHOS 84 84  BILITOT 0.5 0.7  PROT 7.1 6.9  ALBUMIN 3.8 3.5     Antibiotics: Anti-infectives (From admission, onward)    None        DVT prophylaxis: Lovenox  Code Status: Full code  Family Communication: No family at bedside   CONSULTS neurology   Objective    Physical Examination:  General-appears in no acute distress Heart-S1-S2, regular, no murmur auscultated Lungs-clear to auscultation bilaterally, no wheezing or crackles auscultated Abdomen-soft, nontender, no  organomegaly Extremities-no edema in the lower extremities Neuro-alert, oriented x3, left hemiplegia, aphasia  Status is: Inpatient:        Kara Bailey   [facility] If 7PM-7AM, please contact night-coverage at [email or web address], Office  [phone]   07/31/2022, 1:17 PM  LOS: 4 days

## 2022-08-01 ENCOUNTER — Encounter (HOSPITAL_COMMUNITY): Payer: Self-pay | Admitting: Physical Medicine & Rehabilitation

## 2022-08-01 ENCOUNTER — Inpatient Hospital Stay (HOSPITAL_COMMUNITY)
Admission: RE | Admit: 2022-08-01 | Discharge: 2022-09-07 | DRG: 057 | Disposition: A | Discharge status: Home Health | Payer: Medicare HMO | Source: Intra-hospital | Attending: Physical Medicine & Rehabilitation | Admitting: Physical Medicine & Rehabilitation

## 2022-08-01 DIAGNOSIS — G4700 Insomnia, unspecified: Secondary | ICD-10-CM | POA: Diagnosis present

## 2022-08-01 DIAGNOSIS — I69391 Dysphagia following cerebral infarction: Secondary | ICD-10-CM | POA: Diagnosis not present

## 2022-08-01 DIAGNOSIS — F419 Anxiety disorder, unspecified: Secondary | ICD-10-CM | POA: Diagnosis present

## 2022-08-01 DIAGNOSIS — F32A Depression, unspecified: Secondary | ICD-10-CM | POA: Diagnosis present

## 2022-08-01 DIAGNOSIS — K219 Gastro-esophageal reflux disease without esophagitis: Secondary | ICD-10-CM | POA: Diagnosis present

## 2022-08-01 DIAGNOSIS — I10 Essential (primary) hypertension: Secondary | ICD-10-CM | POA: Diagnosis present

## 2022-08-01 DIAGNOSIS — E785 Hyperlipidemia, unspecified: Secondary | ICD-10-CM | POA: Diagnosis present

## 2022-08-01 DIAGNOSIS — Z823 Family history of stroke: Secondary | ICD-10-CM | POA: Diagnosis not present

## 2022-08-01 DIAGNOSIS — I69322 Dysarthria following cerebral infarction: Secondary | ICD-10-CM

## 2022-08-01 DIAGNOSIS — I69392 Facial weakness following cerebral infarction: Secondary | ICD-10-CM

## 2022-08-01 DIAGNOSIS — R059 Cough, unspecified: Secondary | ICD-10-CM | POA: Diagnosis not present

## 2022-08-01 DIAGNOSIS — R1310 Dysphagia, unspecified: Secondary | ICD-10-CM | POA: Diagnosis present

## 2022-08-01 DIAGNOSIS — J439 Emphysema, unspecified: Secondary | ICD-10-CM | POA: Diagnosis present

## 2022-08-01 DIAGNOSIS — F17210 Nicotine dependence, cigarettes, uncomplicated: Secondary | ICD-10-CM | POA: Diagnosis present

## 2022-08-01 DIAGNOSIS — I69320 Aphasia following cerebral infarction: Secondary | ICD-10-CM | POA: Diagnosis not present

## 2022-08-01 DIAGNOSIS — Z8249 Family history of ischemic heart disease and other diseases of the circulatory system: Secondary | ICD-10-CM

## 2022-08-01 DIAGNOSIS — Z7982 Long term (current) use of aspirin: Secondary | ICD-10-CM | POA: Diagnosis not present

## 2022-08-01 DIAGNOSIS — I639 Cerebral infarction, unspecified: Secondary | ICD-10-CM | POA: Diagnosis present

## 2022-08-01 DIAGNOSIS — I739 Peripheral vascular disease, unspecified: Secondary | ICD-10-CM | POA: Diagnosis present

## 2022-08-01 DIAGNOSIS — K5900 Constipation, unspecified: Secondary | ICD-10-CM | POA: Diagnosis present

## 2022-08-01 DIAGNOSIS — K7460 Unspecified cirrhosis of liver: Secondary | ICD-10-CM | POA: Diagnosis present

## 2022-08-01 DIAGNOSIS — I69354 Hemiplegia and hemiparesis following cerebral infarction affecting left non-dominant side: Principal | ICD-10-CM

## 2022-08-01 DIAGNOSIS — R067 Sneezing: Secondary | ICD-10-CM | POA: Diagnosis not present

## 2022-08-01 DIAGNOSIS — Z716 Tobacco abuse counseling: Secondary | ICD-10-CM

## 2022-08-01 DIAGNOSIS — Z79899 Other long term (current) drug therapy: Secondary | ICD-10-CM | POA: Diagnosis not present

## 2022-08-01 DIAGNOSIS — R32 Unspecified urinary incontinence: Secondary | ICD-10-CM | POA: Diagnosis present

## 2022-08-01 DIAGNOSIS — Z7902 Long term (current) use of antithrombotics/antiplatelets: Secondary | ICD-10-CM

## 2022-08-01 DIAGNOSIS — R609 Edema, unspecified: Secondary | ICD-10-CM | POA: Diagnosis not present

## 2022-08-01 DIAGNOSIS — R471 Dysarthria and anarthria: Secondary | ICD-10-CM | POA: Diagnosis not present

## 2022-08-01 DIAGNOSIS — K5901 Slow transit constipation: Secondary | ICD-10-CM | POA: Diagnosis not present

## 2022-08-01 HISTORY — DX: Cerebral infarction, unspecified: I63.9

## 2022-08-01 LAB — GLUCOSE, CAPILLARY
Glucose-Capillary: 108 mg/dL — ABNORMAL HIGH (ref 70–99)
Glucose-Capillary: 125 mg/dL — ABNORMAL HIGH (ref 70–99)
Glucose-Capillary: 89 mg/dL (ref 70–99)
Glucose-Capillary: 99 mg/dL (ref 70–99)

## 2022-08-01 MED ORDER — ATORVASTATIN CALCIUM 80 MG PO TABS
80.0000 mg | ORAL_TABLET | Freq: Every day | ORAL | Status: DC
  Administered 2022-08-02 – 2022-09-07 (×37): 80 mg via ORAL
  Filled 2022-08-01 (×37): qty 1

## 2022-08-01 MED ORDER — ENOXAPARIN SODIUM 40 MG/0.4ML IJ SOSY: 40.0000 mg | PREFILLED_SYRINGE | INTRAMUSCULAR | Status: DC

## 2022-08-01 MED ORDER — ASPIRIN 81 MG PO CHEW
81.0000 mg | CHEWABLE_TABLET | Freq: Every day | ORAL | Status: DC
  Administered 2022-08-02 – 2022-09-07 (×37): 81 mg via ORAL
  Filled 2022-08-01 (×37): qty 1

## 2022-08-01 MED ORDER — GUAIFENESIN-DM 100-10 MG/5ML PO SYRP: 5.0000 mL | ORAL_SOLUTION | Freq: Four times a day (QID) | ORAL | Status: DC | PRN

## 2022-08-01 MED ORDER — MIRTAZAPINE 15 MG PO TABS
30.0000 mg | ORAL_TABLET | Freq: Every day | ORAL | Status: DC
  Administered 2022-08-01 – 2022-09-06 (×37): 30 mg via ORAL
  Filled 2022-08-01 (×38): qty 2

## 2022-08-01 MED ORDER — BUPROPION HCL ER (SR) 150 MG PO TB12
150.0000 mg | ORAL_TABLET | Freq: Every morning | ORAL | Status: DC
  Administered 2022-08-02 – 2022-09-07 (×37): 150 mg via ORAL
  Filled 2022-08-01 (×38): qty 1

## 2022-08-01 MED ORDER — ALPRAZOLAM 0.5 MG PO TABS
0.5000 mg | ORAL_TABLET | Freq: Every day | ORAL | Status: DC | PRN
  Administered 2022-08-04 – 2022-09-06 (×27): 0.5 mg via ORAL
  Filled 2022-08-01 (×28): qty 1

## 2022-08-01 MED ORDER — MAGNESIUM HYDROXIDE 400 MG/5ML PO SUSP
30.0000 mL | Freq: Every day | ORAL | Status: DC | PRN
  Administered 2022-08-03 – 2022-08-19 (×4): 30 mL via ORAL
  Filled 2022-08-01 (×6): qty 30

## 2022-08-01 MED ORDER — CLOPIDOGREL BISULFATE 75 MG PO TABS
75.0000 mg | ORAL_TABLET | Freq: Every day | ORAL | Status: DC
  Administered 2022-08-02 – 2022-09-07 (×37): 75 mg via ORAL
  Filled 2022-08-01 (×37): qty 1

## 2022-08-01 MED ORDER — SORBITOL 70 % SOLN
45.0000 mL | Freq: Once | Status: AC
  Administered 2022-08-02: 45 mL via ORAL
  Filled 2022-08-01: qty 60

## 2022-08-01 MED ORDER — METHOCARBAMOL 500 MG PO TABS
500.0000 mg | ORAL_TABLET | Freq: Four times a day (QID) | ORAL | Status: DC | PRN
  Administered 2022-08-12: 500 mg via ORAL
  Filled 2022-08-01: qty 1

## 2022-08-01 MED ORDER — ENOXAPARIN SODIUM 40 MG/0.4ML IJ SOSY
40.0000 mg | PREFILLED_SYRINGE | INTRAMUSCULAR | Status: DC
  Administered 2022-08-02 – 2022-09-07 (×36): 40 mg via SUBCUTANEOUS
  Filled 2022-08-01 (×32): qty 0.4

## 2022-08-01 MED ORDER — PROCHLORPERAZINE MALEATE 5 MG PO TABS: 5.0000 mg | ORAL_TABLET | Freq: Four times a day (QID) | ORAL | Status: DC | PRN

## 2022-08-01 MED ORDER — DILTIAZEM HCL ER COATED BEADS 180 MG PO CP24
180.0000 mg | ORAL_CAPSULE | Freq: Every day | ORAL | Status: DC
  Administered 2022-08-02 – 2022-08-23 (×22): 180 mg via ORAL
  Filled 2022-08-01 (×22): qty 1

## 2022-08-01 MED ORDER — PANTOPRAZOLE SODIUM 40 MG PO TBEC
40.0000 mg | DELAYED_RELEASE_TABLET | Freq: Every day | ORAL | Status: DC
  Administered 2022-08-02 – 2022-08-29 (×28): 40 mg via ORAL
  Filled 2022-08-01 (×28): qty 1

## 2022-08-01 MED ORDER — DIPHENHYDRAMINE HCL 12.5 MG/5ML PO ELIX: 12.5000 mg | ORAL_SOLUTION | Freq: Four times a day (QID) | ORAL | Status: DC | PRN

## 2022-08-01 MED ORDER — BISACODYL 5 MG PO TBEC
5.0000 mg | DELAYED_RELEASE_TABLET | Freq: Every day | ORAL | Status: DC | PRN
  Filled 2022-08-01: qty 1

## 2022-08-01 MED ORDER — ALUM & MAG HYDROXIDE-SIMETH 200-200-20 MG/5ML PO SUSP: 30.0000 mL | ORAL | Status: DC | PRN

## 2022-08-01 MED ORDER — EZETIMIBE 10 MG PO TABS
10.0000 mg | ORAL_TABLET | Freq: Every day | ORAL | Status: DC
  Administered 2022-08-02 – 2022-09-07 (×37): 10 mg via ORAL
  Filled 2022-08-01 (×37): qty 1

## 2022-08-01 MED ORDER — ACETAMINOPHEN 325 MG PO TABS
325.0000 mg | ORAL_TABLET | ORAL | Status: DC | PRN
  Administered 2022-08-02 – 2022-08-27 (×9): 650 mg via ORAL
  Filled 2022-08-01: qty 2
  Filled 2022-08-01: qty 1
  Filled 2022-08-01 (×2): qty 2
  Filled 2022-08-01: qty 1
  Filled 2022-08-01 (×6): qty 2
  Filled 2022-08-01: qty 1
  Filled 2022-08-01: qty 2

## 2022-08-01 MED ORDER — PROCHLORPERAZINE 25 MG RE SUPP: 12.5000 mg | Freq: Four times a day (QID) | RECTAL | Status: DC | PRN

## 2022-08-01 MED ORDER — FLEET ENEMA 7-19 GM/118ML RE ENEM: 1.0000 | ENEMA | Freq: Once | RECTAL | Status: DC | PRN

## 2022-08-01 MED ORDER — PROCHLORPERAZINE EDISYLATE 10 MG/2ML IJ SOLN: 5.0000 mg | Freq: Four times a day (QID) | INTRAMUSCULAR | Status: DC | PRN

## 2022-08-01 MED ORDER — NICOTINE 21 MG/24HR TD PT24
21.0000 mg | MEDICATED_PATCH | Freq: Every day | TRANSDERMAL | Status: DC
  Administered 2022-08-02 – 2022-09-03 (×33): 21 mg via TRANSDERMAL
  Filled 2022-08-01 (×33): qty 1

## 2022-08-01 NOTE — H&P (Signed)
Physical Medicine and Rehabilitation Admission H&P    CC: Functional deficits secondary to acute on chronic small vessel ischemia of right corona radiata and posterior right lentiform  HPI: Kara Bailey is a 66 year old female with history of prior left thalamic stroke on aspirin and Plavix who presents with worsened left facial droop, dysarthria and left-sided weakness with decreased sensation on 07/26/2022.  Family reports had mild left sided weakness and mild speech changes at baseline due to prior CVA. Neurology was consulted and MRI was significant for patchy acute on chronic small vessel ischemia of the right corona radiata and posterior right lentiform.  Etiology likely chronic small vessel disease.  CT head negative for acute CVA, CTA head/neck negative for LVO.  2D echo reveals LVEF of greater than 75% and some small dilatation of left atrium.  Her LDL is 125 and hemoglobin A1c 5.4.  She was started on Lovenox for DVT prophylaxis.  She is currently tolerating dysphagia 1 diet with thin liquids. She denies any pain. Report she has not had a BM is several days. The patient requires inpatient physical medicine and rehabilitation evaluations and treatment secondary to dysfunction due to small vessel ischemia of right corona radiata and posterior right lentiform.  Denies pain. Anxious to have diet advanced.  Review of Systems  Constitutional:  Negative for chills and fever.  HENT:  Negative for congestion and sore throat.   Eyes:  Negative for double vision.  Respiratory:  Negative for cough and shortness of breath.   Cardiovascular:  Negative for chest pain.  Gastrointestinal:  Positive for constipation. Negative for nausea and vomiting.  Genitourinary:        External catheter in place  Neurological:  Positive for focal weakness. Negative for dizziness.   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   Stroke ([place])    just slurred speech   Tachycardia 08/2014   Ventral hernia    Weakness 08/2014   Past Surgical History:  Procedure Laterality Date   ABDOMINAL AORTOGRAM W/LOWER EXTREMITY N/A 07/13/2021   Procedure: ABDOMINAL AORTOGRAM W/LOWER EXTREMITY;  Surgeon: Lorretta Harp, MD;  Location: [facility];  Service: Cardiovascular;  Laterality: N/A;   ABDOMINAL AORTOGRAM W/LOWER EXTREMITY N/A 03/19/2022   Procedure: ABDOMINAL AORTOGRAM W/LOWER EXTREMITY;  Surgeon: Lorretta Harp, MD;  Location: [facility];  Service: Cardiovascular;  Laterality: N/A;   APPENDECTOMY     CATARACT EXTRACTION, BILATERAL     COLONOSCOPY     COLONOSCOPY WITH PROPOFOL N/A 04/30/2016   Procedure: COLONOSCOPY WITH PROPOFOL;  Surgeon: Daneil Dolin, MD;  [facility];  Service: Endoscopy;  Laterality: N/A;  [place] N/A 10/13/2021   Procedure: OPEN REPAIR EPIGASTRIC HERNIA WITH MESH PATCH;  Surgeon: Armandina Gemma, MD;  Location: [facility];  Service: General;  Laterality: N/A;   ESOPHAGOGASTRODUODENOSCOPY     approximately 2010  ESOPHAGOGASTRODUODENOSCOPY (EGD) WITH PROPOFOL N/A 04/30/2016   Procedure: ESOPHAGOGASTRODUODENOSCOPY (EGD) WITH PROPOFOL;  Surgeon: Daneil Dolin, MD;  [facility];  Service: Endoscopy;  Laterality: N/A;   LEFT HEART CATHETERIZATION WITH CORONARY ANGIOGRAM N/A 10/29/2014   07-14-20- pt denies this Procedure: LEFT HEART CATHETERIZATION WITH CORONARY ANGIOGRAM;  Surgeon: Burnell Blanks, MD;  Location: [facility];  Service:  Cardiovascular;  Laterality: N/A;   MALONEY DILATION N/A 04/30/2016   Procedure: Venia Minks DILATION;  Surgeon: Daneil Dolin, MD;  [facility];  Service: Endoscopy;  Laterality: N/A;   PERIPHERAL VASCULAR INTERVENTION  07/13/2021   Procedure: PERIPHERAL VASCULAR INTERVENTION;  Surgeon: Lorretta Harp, MD;  Location: [facility];  Service: Cardiovascular;;  left SFA left external iliac   PERIPHERAL VASCULAR INTERVENTION Right 03/19/2022   Procedure: PERIPHERAL VASCULAR INTERVENTION;  Surgeon: Lorretta Harp, MD;  Location: [facility];  Service: Cardiovascular;  Laterality: Right;  SFA   POLYPECTOMY  04/30/2016   Procedure: POLYPECTOMY;  Surgeon: Daneil Dolin, MD;  [facility];  Service: Endoscopy;;  Sigmoid colon polyp removed via hot snare   UPPER GASTROINTESTINAL ENDOSCOPY     Family History  Problem Relation Age of Onset   Heart failure Mother    Heart attack Mother    Heart disease Mother    Hypertension Mother    Hyperlipidemia Mother    CVA Father    Alzheimer's disease Father    Cancer Neg Hx    Colon cancer Neg Hx    Esophageal cancer Neg Hx    Rectal cancer Neg Hx    Stomach cancer Neg Hx    Social History:  reports that she has been smoking cigarettes. She started smoking about 53 years ago. She has a 20.00 pack-year smoking history. She has never used smokeless tobacco. She reports current alcohol use. She reports that she does not use drugs. Allergies: No Known Allergies Facility-Administered Medications Prior to Admission  Medication Dose Route Frequency Provider Last Rate Last Admin   sodium chloride flush (NS) 0.9 % injection 3 mL  3 mL Intravenous Q12H Lorretta Harp, MD       Medications Prior to Admission  Medication Sig Dispense Refill   albuterol (VENTOLIN HFA) 108 (90 Base) MCG/ACT inhaler Inhale 2 puffs into the lungs every 6 (six) hours as needed for shortness of breath or wheezing.     aspirin EC 81 MG EC tablet  Take 1 tablet (81 mg total) by mouth daily. Swallow whole. 30 tablet 11   atorvastatin (LIPITOR) 80 MG tablet Take 1 tablet (80 mg total) by mouth daily. (Patient taking differently: Take 80 mg by mouth at bedtime.) 30 tablet 0   buPROPion (ZYBAN) 150 MG 12 hr tablet Take 150 mg by mouth daily as needed (to stop smoking).     clopidogrel (PLAVIX) 75 MG tablet TAKE 1 TABLET EVERY DAY (Patient taking differently: Take 75 mg by mouth daily.) 90 tablet 0   diltiazem (CARDIZEM CD) 180 MG 24 hr capsule Take 180 mg by mouth daily.     ezetimibe (ZETIA) 10 MG tablet Take 10 mg by mouth daily.     famotidine (PEPCID) 40 MG tablet Take 40 mg by mouth at bedtime.     hydrOXYzine (ATARAX/VISTARIL) 50 MG tablet Take 50 mg by mouth 3 (three) times daily as needed for anxiety.     mirtazapine (REMERON) 30 MG tablet Take 0.5 tablets (15 mg total) by mouth  at bedtime. (Patient taking differently: Take 30 mg by mouth at bedtime.) 30 tablet 5   nicotine (NICODERM CQ - DOSED IN MG/24 HOURS) 14 mg/24hr patch Place 14 mg onto the skin daily as needed (nicotine dependence).     Multiple Vitamin (MULTIVITAMIN WITH MINERALS) TABS tablet Take 1 tablet by mouth daily. (Patient not taking: Reported on 07/26/2022) 30 tablet 1   pantoprazole (PROTONIX) 40 MG tablet Take 1 tablet (40 mg total) by mouth daily. (Patient not taking: Reported on 07/26/2022) 30 tablet 5      Home: [place] expects to be discharged to:: Private residence Living Arrangements: Spouse/significant other (boyfriend [name]) Available Help at Discharge: Family Type of Home: Apartment Home Access: Level entry Home Layout: One level Bathroom Shower/Tub: Chiropodist: Handicapped height Bathroom Accessibility: Yes Home Equipment: Tub bench, Hand held shower head, Conservation officer, nature (2 wheels), Radiation protection practitioner Equipment: Reacher  Lives With: Significant other   Functional History: Prior Function Prior  Level of Function : Independent/Modified Independent Mobility Comments: No AD ADLs Comments: Independent in ADL and IADL. 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  Functional Status:  Mobility: Bed Mobility Overal bed mobility: Needs Assistance Bed Mobility: Supine to Sit Rolling: Min assist Sidelying to sit: Mod assist Supine to sit: Mod assist General bed mobility comments: In recliner on arrival and departure Transfers Overall transfer level: Needs assistance Equipment used: 2 person hand held assist (and STEDY) Transfers: Sit to/from Stand Sit to Stand: Mod assist, +2 physical assistance, Min assist Bed to/from chair/wheelchair/BSC transfer type:: Stand pivot Stand pivot transfers: Max assist Step pivot transfers: Max assist, +2 physical assistance, +2 safety/equipment General transfer comment: Performed sit to stand from recliner x 2 with mod A of 2 and cues to scoot forward, assist to rise and support L UE.  Sit to stand x 4 from STEDY with min A of 2 and L UE supported.      ADL: ADL Overall ADL's : Needs assistance/impaired Eating/Feeding: Set up Eating/Feeding Details (indicate cue type and reason): Engaged in self-feeding on arrival, but decreased apetite and reporting motivated to participate in therapy Grooming: Wash/dry face, Oral care, Minimal assistance, Sitting Grooming Details (indicate cue type and reason): Min A for sitting balance to wash face. Min A to open mouth wash as pt without dentition Upper Body Bathing: Moderate assistance, Sitting Upper Body Bathing Details (indicate cue type and reason): Decreased balance in sitting unable to use LUE Lower Body Bathing: Maximal assistance, Sit to/from stand Upper Body Dressing : Moderate assistance, Sitting Lower Body Dressing: Maximal assistance, +2 for physical assistance, +2 for safety/equipment, Sit to/from stand Lower Body Dressing Details (indicate cue type and reason): max A to pull up  underwear Toilet Transfer: Moderate assistance, Maximal assistance, +2 for physical assistance, +2 for safety/equipment (stedy) Toilet Transfer Details (indicate cue type and reason): simulated with stedy Toileting- Clothing Manipulation and Hygiene: Sitting/lateral lean, Moderate assistance, Maximal assistance, +2 for physical assistance Functional mobility during ADLs: Moderate assistance, Maximal assistance, +2 for physical assistance (sit<>stands today and facilitation of weight shifting) General ADL Comments: Impaired by L hemiplegia  Cognition: Cognition Overall Cognitive Status: Impaired/Different from baseline Arousal/Alertness: Awake/alert Orientation Level: Oriented X4 Year: 2023 Month: September Day of Week: Other (Comment) (did not ask) Memory: Appears intact (for tasks during evaluation - prospective recall - slp to obtain pt pen at end of test) Awareness: Appears intact Problem Solving: Appears intact Safety/Judgment: Appears intact Comments: Pt wrote  that she can't stand and was worried she may fall with PT/OT - showing excellent awareness to her defictis Cognition Arousal/Alertness: Awake/alert Behavior During Therapy: WFL for tasks assessed/performed Overall Cognitive Status: Impaired/Different from baseline Area of Impairment: Safety/judgement, Problem solving, Attention Current Attention Level: Selective Safety/Judgement: Decreased awareness of safety Problem Solving: Difficulty sequencing, Requires verbal cues General Comments: Able to communicate her needs very well by writing. Pt with fear during use of mobility equipment -able to be reassured/educated. Following all commands with increased time.  Physical Exam: Blood pressure 138/76, pulse 89, temperature (!) 97.5 F (36.4 C), temperature source Oral, resp. rate 16, height '5\' 6"'$  (1.676 m), weight 65.6 kg, SpO2 98 %.  General: Alert and oriented x 4, No apparent distress HEENT: Head is normocephalic,  atraumatic, PERRLA, EOMI, sclera anicteric, oral mucosa pink and moist, dentition - wears dentures but doesn't have them with her currently, ext ear canals clear,  Neck: Supple without JVD or lymphadenopathy Heart: Reg rate and rhythm. No murmurs rubs or gallops Chest: CTA bilaterally without wheezes, rales, or rhonchi; no distress Abdomen: Soft, non-tender, non-distended, bowel sounds positive. Extremities: No clubbing, cyanosis, or edema. Pulses are 2+ Psych: Pt's affect is appropriate. Pt is cooperative Skin: Clean and intact without signs of breakdown Neuro:  Left facial weakness, PERRLA,  Tongue protruded to right, EOMI, follows commands, Difficulty turning head to left, Decreased shoulder shrug on left, Expressive aphasia with minimal verbal output, she was able to repeat 1 word today, She is able to answer by writing Sensation decreased to LT on Left face, arm and leg, intact on the right Strength 5/5 in RUE and RLE Strength 0/5 in LLE and LUE FTN intact RUE Musculoskeletal: decreased tone in Left UE and LE No Rom deficits noted No joint swelling or tenderness Decreased muscle bulk  Results for orders placed or performed during the hospital encounter of 07/26/22 (from the past 48 hour(s))  Glucose, capillary     Status: Abnormal   Collection Time: 07/30/22 12:45 PM  Result Value Ref Range   Glucose-Capillary 181 (H) 70 - 99 mg/dL    Comment: Glucose reference range applies only to samples taken after fasting for at least 8 hours.   Comment 1 Notify RN    Comment 2 Document in Chart   Glucose, capillary     Status: None   Collection Time: 07/30/22  4:40 PM  Result Value Ref Range   Glucose-Capillary 92 70 - 99 mg/dL    Comment: Glucose reference range applies only to samples taken after fasting for at least 8 hours.  Glucose, capillary     Status: None   Collection Time: 07/31/22 12:08 AM  Result Value Ref Range   Glucose-Capillary 94 70 - 99 mg/dL    Comment: Glucose  reference range applies only to samples taken after fasting for at least 8 hours.  Glucose, capillary     Status: Abnormal   Collection Time: 07/31/22  6:08 AM  Result Value Ref Range   Glucose-Capillary 100 (H) 70 - 99 mg/dL    Comment: Glucose reference range applies only to samples taken after fasting for at least 8 hours.  Basic metabolic panel     Status: None   Collection Time: 07/31/22 10:27 AM  Result Value Ref Range   Sodium 143 135 - 145 mmol/L   Potassium 4.1 3.5 - 5.1 mmol/L   Chloride 110 98 - 111 mmol/L   CO2 23 22 - 32 mmol/L   Glucose, Bld 93  70 - 99 mg/dL    Comment: Glucose reference range applies only to samples taken after fasting for at least 8 hours.   BUN 10 8 - 23 mg/dL   Creatinine, Ser 0.90 0.44 - 1.00 mg/dL   Calcium 9.8 8.9 - 10.3 mg/dL   GFR, Estimated >60 >60 mL/min    Comment: (NOTE) Calculated using the CKD-EPI Creatinine Equation (2021)    Anion gap 10 5 - 15    Comment: Performed at [facility] [address] [place], [place] [address]  Magnesium     Status: None   Collection Time: 07/31/22 10:27 AM  Result Value Ref Range   Magnesium 2.4 1.7 - 2.4 mg/dL    Comment: Performed at [facility], [place] [address], [place], [place] [address]  Glucose, capillary     Status: None   Collection Time: 07/31/22 12:01 PM  Result Value Ref Range   Glucose-Capillary 97 70 - 99 mg/dL    Comment: Glucose reference range applies only to samples taken after fasting for at least 8 hours.   Comment 1 Notify RN    Comment 2 Document in Chart   Glucose, capillary     Status: Abnormal   Collection Time: 07/31/22  4:56 PM  Result Value Ref Range   Glucose-Capillary 102 (H) 70 - 99 mg/dL    Comment: Glucose reference range applies only to samples taken after fasting for at least 8 hours.   Comment 1 Notify RN    Comment 2 Document in Chart   Glucose, capillary     Status: Abnormal   Collection Time: 08/01/22 12:17 AM  Result Value Ref Range    Glucose-Capillary 108 (H) 70 - 99 mg/dL    Comment: Glucose reference range applies only to samples taken after fasting for at least 8 hours.  Glucose, capillary     Status: None   Collection Time: 08/01/22  6:06 AM  Result Value Ref Range   Glucose-Capillary 89 70 - 99 mg/dL    Comment: Glucose reference range applies only to samples taken after fasting for at least 8 hours.   No results found.    Blood pressure 138/76, pulse 89, temperature (!) 97.5 F (36.4 C), temperature source Oral, resp. rate 16, height '5\' 6"'$  (1.676 m), weight 65.6 kg, SpO2 98 %.  Medical Problem List and Plan: 1. 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  Antithrombotics: -DVT/anticoagulation:  Pharmaceutical: Lovenox  -antiplatelet therapy: continue Plavix and aspirin as PTA 3. Pain Management: tylenol as needed 4. Mood/Behavior/Sleep: LCSW to evaluate and provide emotional support  -antipsychotic agents: n/a  -bupropion ER 150 mg q AM  -mirtazapine 30 mg q HS  -alprazolam 0.5 mg daily prn anxiety 5. Neuropsych/cognition: This patient is capable of making decisions on her own behalf. 6. Skin/Wound Care: routine skin care checks 7. Fluids/Electrolytes/Nutrition: routine Is and Os and follow-up chemistries  Dysphagia 1 diet/thin-SLP eval 8: Left-sided weakness/decreased sensation: check LLE venous duplex 9: Hypertension: continue to monitor; BP mildly elevated but stable overall, avoid hypotension  -continue Cardizem 180 mg daily 10: Hyperlipidemia: LDL = 125, counsel regarding healthy diet  -continue  ezetimibe 10 mg daily  -continue atorvastatin 90 mg daily 11: Tobacco use: cessation counseling  -continue nicotine patch 21 mg 12. Constipation  -Order laxatives  13. GERD  -Continue PPI   I  have personally performed a face to face diagnostic evaluation of  this patient and formulated the key components of the plan.  Additionally, I have personally reviewed laboratory data, imaging studies, as well as relevant notes and concur with the physician assistant's documentation above.  The patient's status has not changed from the original H&P.  Any changes in documentation from the acute care chart have been noted above.  Jennye Boroughs, MD, FAAPMR   Barbie Banner, PA-C 08/01/2022

## 2022-08-01 NOTE — Progress Notes (Signed)
Inpatient Rehabilitation Admission Medication Review by a Pharmacist   A complete drug regimen review was completed for this patient to identify any potential clinically significant medication issues.   High Risk Drug Classes Is patient taking? Indication by Medication  Antipsychotic Yes Compazine (PO/IM) - nausea/vomiting Robitussin DM - cough  Anticoagulant Yes Lovenox (SQ) - DVT ppx  Antibiotic No    Opioid No    Antiplatelet Yes ASA/Plavix (on both PTA) - stroke  Hypoglycemics/insulin No    Vasoactive Medication Yes Diltiazem - HTN  Chemotherapy No    Other Yes Atorvastatin/Zetia - HLD Nicotine patch, bupropion - smoking cessation Protonix - GERD Benadryl - itching Robaxin - muscle relaxant Alprazolam, mirtazapine - anxiety/mood        Type of Medication Issue Identified Description of Issue Recommendation(s)  Drug Interaction(s) (clinically significant)        Duplicate Therapy        Allergy        No Medication Administration End Date        Incorrect Dose        Additional Drug Therapy Needed        Significant med changes from prior encounter (inform family/care partners about these prior to discharge).      Other            Clinically significant medication issues were identified that warrant physician communication and completion of prescribed/recommended actions by midnight of the next day:  No   Name of provider notified for urgent issues identified:    Provider Method of Notification:      Pharmacist comments:    Time spent performing this drug regimen review (minutes):  [place], PharmD, BCPS 08/01/2022 12:27 PM

## 2022-08-01 NOTE — Plan of Care (Signed)
  Problem: Nutrition: Goal: Adequate nutrition will be maintained Outcome: Progressing   Problem: Elimination: Goal: Will not experience complications related to bowel motility Outcome: Progressing Goal: Will not experience complications related to urinary retention Outcome: Progressing   Problem: Safety: Goal: Ability to remain free from injury will improve Outcome: Progressing   Problem: Education: Goal: Knowledge of secondary prevention will improve (SELECT ALL) Outcome: Progressing

## 2022-08-01 NOTE — Progress Notes (Addendum)
PMR Admission Coordinator Pre-Admission Assessment   Patient: Kara Bailey is an 66 y.o., female MRN: [id number] DOB: [birth year] Height: _0  (167.6 cm) Weight: 65.6 kg   Insurance Information HMO: ye    PPO:      PCP:      IPA:      80/20:      OTHER:  PRIMARY: [employer]      Policy#: [id number]      Subscriber: Pt. CM Name: Cecille Rubin       Phone#: [phone]     Fax#: [phone] Pt. Approved from 9/18 for 7 days with review due 9/24.  Pre-Cert#: [id number] Employer:  Benefits:  Phone #:      Name:  Irene Shipper Date: 01/10/2022- still active   Deductible: does not have one   OOP Max: $8,300 ($788.64 met)   CIR: $370/day co-pay with a max co-pay of $2,220/admission (6 days)  SNF: $0/day co-pay for days 1-20, $196/day co-pay for days 21-100, limited to 100 days/cal yr  Outpatient:  80% coverage; 20% co-insurance  Home Health:  100% coverage  DME: 80% coverage; 20% co-insurance   SECONDARY:  Medicaid [place] Access      Policy#:   [id number]   Phone#:      Financial Counselor:       Phone#:    The "Data Collection Information Summary" for patients in Inpatient Rehabilitation Facilities with attached "Privacy Act [place] Records" was provided and verbally reviewed with: Patient   Emergency Contact Information Contact Information       Name Relation Home Work Mobile    [name] Significant other [place] Son [phone]        Singleton,Catherine Sister [phone]        [name] Daughter     [phone]           Current Medical History  Patient Admitting Diagnosis: CVA  History of Present Illness:       Pt. Is a 66 y.o. female presneting to [facility] ED 07/26/22 with L sided weaqkness and slurred speech. 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 PMH significant for prior L thalamic CVA, HTN, tobacco abuse, PAD, and emphysema.   On arrival temp not recorded.  Blood pressure 151/98. Labs showed showed a creatinine of 1.18, BUN of 12, glucose of 102, rest of her CMP was in normal limits.White count 11.2, hemoglobin 13.7, platelets 247. CT head showed advanced chronic small vessel ischemic disease. CTA head neck was negative for LVO.  She did have some intracranial atherosclerosis with increased stenosis of the distal right M1 and posterior M2 branches. Echocardiogram showed EF of greater than 44%, grade 1 diastolic dysfunction. Neurology consulted and recommend that Pt. continue aspirin and Plavix, which she was taking prior to admission.Lipid profile shows LDL 125, continue atorvastatin, Zetia. PT/OT/SLP saw Pt. And recommended CIR to assist return to PLOF.        Complete NIHSS TOTAL: 9   Patient's medical record from [facility] has been reviewed by the rehabilitation admission coordinator and physician.   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 No   Family History   family history includes Alzheimer's disease in her father; CVA in her father; Heart attack in her mother; Heart disease in her mother; Heart failure in her mother; Hyperlipidemia in her mother; Hypertension in her mother.   Current Medications    Current Facility-Administered Medications:    acetaminophen (TYLENOL) tablet 650 mg, 650 mg, Oral, Q4H PRN **OR** acetaminophen (TYLENOL) 160 MG/5ML solution 650 mg, 650 mg, Per Tube, Q4H PRN **OR** acetaminophen (TYLENOL) suppository 650 mg, 650 mg, Rectal, Q4H PRN, Kristopher Oppenheim, DO   albuterol (PROVENTIL) (2.5 MG/3ML) 0.083% nebulizer solution 2.5 mg, 2.5 mg, Inhalation, Q6H PRN, Carrion-Carrero, Margely, MD   aspirin chewable tablet 81 mg, 81 mg, Oral, Daily, Carrion-Carrero, Margely, MD, 81 mg at 07/28/22 9509   aspirin suppository 300 mg, 300 mg, Rectal, Daily, Rise Patience, MD   atorvastatin (LIPITOR) tablet 80 mg, 80 mg, Oral, Daily, Carrion-Carrero, Margely, MD, 80 mg at 07/28/22 0938   buPROPion (WELLBUTRIN SR) 12 hr tablet 150 mg, 150 mg, Oral, q AM, Carrion-Carrero, Margely, MD, 150 mg at 07/28/22 3267   clopidogrel (PLAVIX) tablet 75 mg, 75 mg, Oral, Daily, Carrion-Carrero, Margely, MD, 75 mg at 07/28/22 0939   enoxaparin (LOVENOX) injection 40 mg, 40 mg, Subcutaneous, Q24H, Carrion-Carrero, Margely, MD, 40 mg at 07/28/22 1232   ezetimibe (ZETIA) tablet 10 mg, 10 mg, Oral, Daily, Carrion-Carrero, Margely, MD, 10 mg at 07/28/22 0939   famotidine (PEPCID) IVPB 20 mg premix, 20 mg, Intravenous, Q24H, Rise Patience, MD, Last Rate: 100 mL/hr at 07/27/22 2227, 20 mg at 07/27/22 2227   mirtazapine (REMERON) tablet 30 mg, 30 mg, Oral, QHS, Carrion-Carrero, Margely, MD, 30 mg at 07/27/22 2225   nicotine (NICODERM CQ - dosed in mg/24 hours) patch 14 mg, 14 mg, Transdermal, Daily PRN, Kristopher Oppenheim, DO   nicotine (NICODERM CQ - dosed in mg/24 hours) patch 21 mg, 21 mg, Transdermal, Daily, Darrick Meigs, Gagan S, MD, 21 mg at 07/28/22 1232   pantoprazole (PROTONIX) injection 40 mg, 40 mg, Intravenous, Q24H, Rise Patience, MD, 40 mg at 07/28/22 1245   sodium chloride flush (NS) 0.9 % injection 3 mL, 3 mL, Intravenous, Once, Kristopher Oppenheim, DO   Patients Current Diet:  Diet Order                   DIET - DYS 1 Room service appropriate? No; Fluid consistency: Thin  Diet effective now                         Precautions / Restrictions Precautions Precautions: Fall Precaution Comments: L hemi Restrictions Weight Bearing Restrictions: No    Has the patient had 2 or more falls or a fall with injury in the past year? No   Prior Activity Level Community (5-7x/wk): Pt. active in  the community PTA   Prior Functional Level Self Care: Did the patient need help bathing, dressing, using the toilet or eating? Independent   Indoor Mobility: Did the patient need assistance with walking from room to room (with or without device)? Independent   Stairs: Did the patient need assistance with internal or external stairs (with or without device)? Independent   Functional Cognition: Did the patient need help planning regular tasks such as shopping or remembering to take medications? Independent   Patient Information Are you of Hispanic, Latino/a,or Spanish origin?: A. No, not of Hispanic, Latino/a, or Spanish origin What is your race?: B. Black or African American Do you need or want an interpreter to communicate with a doctor or health care staff?: 0. No   Patient's Response To:  Health Literacy and Transportation Is the patient able to respond to health literacy and transportation needs?: Yes Health Literacy - How often do you need to have someone help you when you read instructions, pamphlets, or other written material from your doctor or pharmacy?: Never In the past 12 months, has lack of transportation kept you from medical appointments or from getting medications?: No In the past 12 months, has lack of transportation kept you from meetings, work, or from getting things needed for daily living?: No   Home Assistive Devices / Equipment Home Equipment: Tub bench, Hand held shower head, Rolling Walker (2 wheels), Adaptive equipment   Prior Device Use: Indicate devices/aids used by  the patient prior to current illness, exacerbation or injury? None of the above   Current Functional Level Cognition   Arousal/Alertness: Awake/alert Overall Cognitive Status: Impaired/Different from baseline Current Attention Level: Sustained, Selective (decreased R attention) Orientation Level: Oriented X4 Safety/Judgement: Decreased awareness of safety General Comments: Pt cognition appears to be intact for tasks assessed. Potentially decreased safety awareness/problem solving with new current functional abilities. Decreased use of compensatory techniques, but following all commands and writing meaninful questions on paper to ask RN. Pt with expressive difficulties. Memory: Appears intact (for tasks during evaluation - prospective recall - slp to obtain pt pen at end of test) Awareness: Appears intact Problem Solving: Appears intact Safety/Judgment: Appears intact Comments: Pt wrote that she can't stand and was worried she may fall with PT/OT - showing excellent awareness to her defictis    Extremity Assessment (includes Sensation/Coordination)   Upper Extremity Assessment: Defer to OT evaluation RUE Deficits / Details: 4+/5 grip/push/pull. 4-/5 shoulder flexion LUE Deficits / Details: Painful, Significantly decreased ROM. 1 to 2-/5 throughout shoulder. No activation in hand 0/5 LUE Sensation: decreased light touch LUE Coordination: decreased fine motor, decreased gross motor  Lower Extremity Assessment: LLE deficits/detail LLE Deficits / Details: AAROM WFL, strength hip flexion 2-/5, knee extension 1/5, ankle DF 2+/5 LLE Sensation: decreased light touch LLE Coordination: decreased gross motor, decreased fine motor     ADLs   Overall ADL's : Needs assistance/impaired Eating/Feeding: NPO Eating/Feeding Details (indicate cue type and reason): swallow study later today Grooming: Oral care, Moderate assistance, Bed level Upper Body Bathing: Moderate assistance, Sitting Upper Body  Bathing Details (indicate cue type and reason): Decreased balance in sitting unable to use LUE Lower Body Bathing: Maximal assistance, Sit to/from stand Upper Body Dressing : Moderate assistance, Sitting Lower Body Dressing: Maximal assistance, +2 for physical assistance, +2 for safety/equipment, Sit to/from stand Lower Body Dressing Details (indicate cue type and reason): max A to pull up underwear Toilet Transfer: Maximal assistance, +2 for physical assistance, +2 for safety/equipment, Stand-pivot, BSC/3in1 Toilet  Transfer Details (indicate cue type and reason): simulated to recliner. Max A for management of LLE and balance Toileting- Clothing Manipulation and Hygiene: Sitting/lateral lean, Moderate assistance, Maximal assistance, +2 for physical assistance Functional mobility during ADLs: Moderate assistance, Maximal assistance, +2 for physical assistance (sit<>stand and SPT today) General ADL Comments: Pt limited by impaired verbal expression and decreased sensation and use fo L side     Mobility   Overal bed mobility: Needs Assistance Bed Mobility: Rolling, Sidelying to Sit Rolling: Min assist Sidelying to sit: Mod assist General bed mobility comments: Rolling toward L. Mod A +t to bring BLE off of bed and raise trunk. Pt following commands to puch LLE off of bed with RLE, but requiring up to min A to execute. Mod A to raise trunk.     Transfers   Overall transfer level: Needs assistance Equipment used: 2 person hand held assist Transfers: Sit to/from Stand, Bed to chair/wheelchair/BSC Sit to Stand: Mod assist, +2 physical assistance, +2 safety/equipment, Max assist Bed to/from chair/wheelchair/BSC transfer type:: Stand pivot Step pivot transfers: Max assist, +2 physical assistance, +2 safety/equipment General transfer comment: Mod-Max A +2 assist for sit<>stand with LLE blocking. Max A +2 for stand pivot with therapist facilitation of LLE advancement and weight shift     Ambulation /  Gait / Stairs / Wheelchair Mobility         Posture / Balance Dynamic Sitting Balance Sitting balance - Comments: Requires min guard and frequent min A Balance Overall balance assessment: Needs assistance Sitting-balance support: Single extremity supported, Feet supported Sitting balance-Leahy Scale: Poor Sitting balance - Comments: Requires min guard and frequent min A Standing balance support: Bilateral upper extremity supported, During functional activity Standing balance-Leahy Scale: Zero Standing balance comment: Requires mod-max A to maintain standing balance.     Special needs/care consideration Skin intact    Previous Home Environment (from acute therapy documentation) Living Arrangements: Spouse/significant other (boyfriend [name])  Lives With: Significant other Available Help at Discharge: Family Type of Home: Apartment Home Layout: One level Home Access: Level entry Bathroom Shower/Tub: Chiropodist: Handicapped height Bathroom Accessibility: Yes How Accessible: Accessible via walker [place]: No   Discharge Living Setting Plans for Discharge Living Setting: Patient's home Type of Home at Discharge: [place]: One level Discharge Home Access: Level entry Entrance Stairs-Rails: None Discharge Bathroom Shower/Tub: [place] unit Discharge Bathroom Toilet: Handicapped height Discharge Bathroom Accessibility: No   [place] Patient Roles: Spouse Contact Information: [phone] Anticipated Caregiver: Wylene Men Caregiver Availability: 24/7 Discharge Plan Discussed with Primary Caregiver: No Is Caregiver In Agreement with Plan?: No Does Caregiver/Family have Issues with Lodging/Transportation while Pt is in Rehab?: No   Goals Patient/Family Goal for Rehab: PT/OT/SLP MIn A Expected length of stay: 18-21 days Pt/Family Agrees to Admission and willing to participate: Yes Program  Orientation Provided & Reviewed with Pt/Caregiver Including Roles  & Responsibilities: Yes   Decrease burden of Care through IP rehab admission: not anticipated   Possible need for SNF placement upon discharge: not anticipated    Patient Condition: I have reviewed medical records from [facility] , spoken with CM, and patient. I met with patient at the bedside for inpatient rehabilitation assessment.  Patient will benefit from ongoing PT, OT, and SLP, can actively participate in 3 hours of therapy a day 5 days of the week, and can make measurable gains during the admission.  Patient will also benefit from the coordinated team approach during  an Inpatient Acute Rehabilitation admission.  The patient will receive intensive therapy as well as Rehabilitation physician, nursing, social worker, and care management interventions.  Due to safety, skin/wound care, disease management, medication administration, pain management, and patient education the patient requires 24 hour a day rehabilitation nursing.  The patient is currently ** with mobility and basic ADLs.  Discharge setting and therapy post discharge at home with home health is anticipated.  Patient has agreed to participate in the Acute Inpatient Rehabilitation Program and will admit today.   Preadmission Screen Completed By:  Genella Mech, 07/28/2022 1:54 PM ______________________________________________________________________   Discussed status with Dr. Curlene Dolphin on 08/01/22 at 15 and received approval for admission today.   Admission Coordinator:  Genella Mech, CCC-SLP, time 1042/Date 08/01/22    Assessment/Plan: Diagnosis: CVA Does the need for close, 24 hr/day Medical supervision in concert with the patient's rehab needs make it unreasonable for this patient to be served in a less intensive setting? Yes Co-Morbidities requiring supervision/potential complications: HTN, Dysphagia, tobacco abuse Due to bladder management,  bowel management, safety, skin/wound care, disease management, medication administration, pain management, and patient education, does the patient require 24 hr/day rehab nursing? Yes Does the patient require coordinated care of a physician, rehab nurse, PT, OT, and SLP to address physical and functional deficits in the context of the above medical diagnosis(es)? Yes Addressing deficits in the following areas: balance, endurance, locomotion, strength, transferring, bowel/bladder control, bathing, dressing, feeding, grooming, toileting, cognition, speech, language, swallowing, and psychosocial support Can the patient actively participate in an intensive therapy program of at least 3 hrs of therapy 5 days a week? Yes The potential for patient to make measurable gains while on inpatient rehab is good Anticipated functional outcomes upon discharge from inpatient rehab: min assist PT, min assist OT, min assist SLP Estimated rehab length of stay to reach the above functional goals is: 18-21 Anticipated discharge destination: Home 10. Overall Rehab/Functional Prognosis: good     MD Signature: Jennye Boroughs

## 2022-08-01 NOTE — H&P (Addendum)
Physical Medicine and Rehabilitation Admission H&P    CC: Functional deficits secondary to acute on chronic small vessel ischemia of right corona radiata and posterior right lentiform  HPI: Kara Bailey is a 66 year old female with history of prior left thalamic stroke on aspirin and Plavix who presents with worsened left facial droop, dysarthria and left-sided weakness with decreased sensation on 07/26/2022.  Family reports had mild left sided weakness and mild speech changes at baseline due to prior CVA. Neurology was consulted and MRI was significant for patchy acute on chronic small vessel ischemia of the right corona radiata and posterior right lentiform.  Etiology likely chronic small vessel disease.  CT head negative for acute CVA, CTA head/neck negative for LVO.  2D echo reveals LVEF of greater than 75% and some small dilatation of left atrium.  Her LDL is 125 and hemoglobin A1c 5.4.  She was started on Lovenox for DVT prophylaxis.  She is currently tolerating dysphagia 1 diet with thin liquids. Report she has not had a BM is several days. The patient requires inpatient physical medicine and rehabilitation evaluations and treatment secondary to dysfunction due to small vessel ischemia of right corona radiata and posterior right lentiform.  Denies pain. Anxious to have diet advanced.  Review of Systems  Constitutional:  Negative for chills and fever.  HENT:  Negative for congestion and sore throat.   Eyes:  Negative for double vision.  Respiratory:  Negative for cough and shortness of breath.   Cardiovascular:  Negative for chest pain.  Gastrointestinal:  Positive for constipation. Negative for nausea and vomiting.  Genitourinary:        External catheter in place  Neurological:  Positive for sensory change, speech change and focal weakness. Negative for dizziness.   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   Stroke ([place])    just slurred speech   Tachycardia 08/2014   Ventral hernia    Weakness 08/2014   Past Surgical History:  Procedure Laterality Date   ABDOMINAL AORTOGRAM W/LOWER EXTREMITY N/A 07/13/2021   Procedure: ABDOMINAL AORTOGRAM W/LOWER EXTREMITY;  Surgeon: Lorretta Harp, MD;  Location: [facility];  Service: Cardiovascular;  Laterality: N/A;   ABDOMINAL AORTOGRAM W/LOWER EXTREMITY N/A 03/19/2022   Procedure: ABDOMINAL AORTOGRAM W/LOWER EXTREMITY;  Surgeon: Lorretta Harp, MD;  Location: [facility];  Service: Cardiovascular;  Laterality: N/A;   APPENDECTOMY     CATARACT EXTRACTION, BILATERAL     COLONOSCOPY     COLONOSCOPY WITH PROPOFOL N/A 04/30/2016   Procedure: COLONOSCOPY WITH PROPOFOL;  Surgeon: Daneil Dolin, MD;  [facility];  Service: Endoscopy;  Laterality: N/A;  Aguilar N/A 10/13/2021   Procedure: OPEN REPAIR EPIGASTRIC HERNIA WITH MESH PATCH;  Surgeon: Armandina Gemma, MD;  Location: [facility];  Service: General;  Laterality: N/A;   ESOPHAGOGASTRODUODENOSCOPY     approximately 2010  ESOPHAGOGASTRODUODENOSCOPY (EGD) WITH PROPOFOL N/A 04/30/2016   Procedure: ESOPHAGOGASTRODUODENOSCOPY (EGD) WITH PROPOFOL;  Surgeon: Daneil Dolin, MD;  [facility];  Service: Endoscopy;  Laterality: N/A;   LEFT HEART CATHETERIZATION WITH CORONARY ANGIOGRAM N/A 10/29/2014   07-14-20- pt denies this Procedure: LEFT HEART CATHETERIZATION WITH CORONARY ANGIOGRAM;  Surgeon: Burnell Blanks, MD;  Location: [facility];   Service: Cardiovascular;  Laterality: N/A;   MALONEY DILATION N/A 04/30/2016   Procedure: Venia Minks DILATION;  Surgeon: Daneil Dolin, MD;  [facility];  Service: Endoscopy;  Laterality: N/A;   PERIPHERAL VASCULAR INTERVENTION  07/13/2021   Procedure: PERIPHERAL VASCULAR INTERVENTION;  Surgeon: Lorretta Harp, MD;  Location: [facility];  Service: Cardiovascular;;  left SFA left external iliac   PERIPHERAL VASCULAR INTERVENTION Right 03/19/2022   Procedure: PERIPHERAL VASCULAR INTERVENTION;  Surgeon: Lorretta Harp, MD;  Location: [facility];  Service: Cardiovascular;  Laterality: Right;  SFA   POLYPECTOMY  04/30/2016   Procedure: POLYPECTOMY;  Surgeon: Daneil Dolin, MD;  [facility];  Service: Endoscopy;;  Sigmoid colon polyp removed via hot snare   UPPER GASTROINTESTINAL ENDOSCOPY     Family History  Problem Relation Age of Onset   Heart failure Mother    Heart attack Mother    Heart disease Mother    Hypertension Mother    Hyperlipidemia Mother    CVA Father    Alzheimer's disease Father    Cancer Neg Hx    Colon cancer Neg Hx    Esophageal cancer Neg Hx    Rectal cancer Neg Hx    Stomach cancer Neg Hx    Social History:  reports that she has been smoking cigarettes. She started smoking about 53 years ago. She has a 20.00 pack-year smoking history. She has never used smokeless tobacco. She reports current alcohol use. She reports that she does not use drugs. Allergies: No Known Allergies Facility-Administered Medications Prior to Admission  Medication Dose Route Frequency Provider Last Rate Last Admin   sodium chloride flush (NS) 0.9 % injection 3 mL  3 mL Intravenous Q12H Lorretta Harp, MD       Medications Prior to Admission  Medication Sig Dispense Refill   albuterol (VENTOLIN HFA) 108 (90 Base) MCG/ACT inhaler Inhale 2 puffs into the lungs every 6 (six) hours as needed for shortness of breath or wheezing.     aspirin EC 81 MG EC  tablet Take 1 tablet (81 mg total) by mouth daily. Swallow whole. 30 tablet 11   atorvastatin (LIPITOR) 80 MG tablet Take 1 tablet (80 mg total) by mouth daily. (Patient taking differently: Take 80 mg by mouth at bedtime.) 30 tablet 0   buPROPion (ZYBAN) 150 MG 12 hr tablet Take 150 mg by mouth daily as needed (to stop smoking).     clopidogrel (PLAVIX) 75 MG tablet TAKE 1 TABLET EVERY DAY (Patient taking differently: Take 75 mg by mouth daily.) 90 tablet 0   diltiazem (CARDIZEM CD) 180 MG 24 hr capsule Take 180 mg by mouth daily.     ezetimibe (ZETIA) 10 MG tablet Take 10 mg by mouth daily.     famotidine (PEPCID) 40 MG tablet Take 40 mg by mouth at bedtime.     hydrOXYzine (ATARAX/VISTARIL) 50 MG tablet Take 50 mg by mouth 3 (three) times daily as needed for anxiety.     mirtazapine (REMERON) 30 MG tablet Take 0.5 tablets (15 mg total) by mouth  at bedtime. (Patient taking differently: Take 30 mg by mouth at bedtime.) 30 tablet 5   Multiple Vitamin (MULTIVITAMIN WITH MINERALS) TABS tablet Take 1 tablet by mouth daily. (Patient not taking: Reported on 07/26/2022) 30 tablet 1   nicotine (NICODERM CQ - DOSED IN MG/24 HOURS) 14 mg/24hr patch Place 14 mg onto the skin daily as needed (nicotine dependence).     pantoprazole (PROTONIX) 40 MG tablet Take 1 tablet (40 mg total) by mouth daily. (Patient not taking: Reported on 07/26/2022) 30 tablet 5   Home: [place] expects to be discharged to:: Private residence Living Arrangements: Spouse/significant other (boyfriend [name]) Available Help at Discharge: Family Type of Home: Apartment Home Access: Level entry Home Layout: One level Bathroom Shower/Tub: Chiropodist: Handicapped height Bathroom Accessibility: Yes Home Equipment: Tub bench, Hand held shower head, Conservation officer, nature (2 wheels), Radiation protection practitioner Equipment: Reacher  Lives With: Significant other   Functional History: Prior Function Prior  Level of Function : Independent/Modified Independent Mobility Comments: No AD ADLs Comments: Independent in ADL and IADL. 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   Functional Status:  Mobility: Bed Mobility Overal bed mobility: Needs Assistance Bed Mobility: Supine to Sit Rolling: Min assist Sidelying to sit: Mod assist Supine to sit: Mod assist General bed mobility comments: In recliner on arrival and departure Transfers Overall transfer level: Needs assistance Equipment used: 2 person hand held assist (and STEDY) Transfers: Sit to/from Stand Sit to Stand: Mod assist, +2 physical assistance, Min assist Bed to/from chair/wheelchair/BSC transfer type:: Stand pivot Stand pivot transfers: Max assist Step pivot transfers: Max assist, +2 physical assistance, +2 safety/equipment General transfer comment: Performed sit to stand from recliner x 2 with mod A of 2 and cues to scoot forward, assist to rise and support L UE.  Sit to stand x 4 from STEDY with min A of 2 and L UE supported.   ADL: ADL Overall ADL's : Needs assistance/impaired Eating/Feeding: Set up Eating/Feeding Details (indicate cue type and reason): Engaged in self-feeding on arrival, but decreased apetite and reporting motivated to participate in therapy Grooming: Wash/dry face, Oral care, Minimal assistance, Sitting Grooming Details (indicate cue type and reason): Min A for sitting balance to wash face. Min A to open mouth wash as pt without dentition Upper Body Bathing: Moderate assistance, Sitting Upper Body Bathing Details (indicate cue type and reason): Decreased balance in sitting unable to use LUE Lower Body Bathing: Maximal assistance, Sit to/from stand Upper Body Dressing : Moderate assistance, Sitting Lower Body Dressing: Maximal assistance, +2 for physical assistance, +2 for safety/equipment, Sit to/from stand Lower Body Dressing Details (indicate cue type and reason): max A to pull up  underwear Toilet Transfer: Moderate assistance, Maximal assistance, +2 for physical assistance, +2 for safety/equipment (stedy) Toilet Transfer Details (indicate cue type and reason): simulated with stedy Toileting- Clothing Manipulation and Hygiene: Sitting/lateral lean, Moderate assistance, Maximal assistance, +2 for physical assistance Functional mobility during ADLs: Moderate assistance, Maximal assistance, +2 for physical assistance (sit<>stands today and facilitation of weight shifting) General ADL Comments: Impaired by L hemiplegia   Cognition: Cognition Overall Cognitive Status: Impaired/Different from baseline Arousal/Alertness: Awake/alert Orientation Level: Oriented X4 Year: 2023 Month: September Day of Week: Other (Comment) (did not ask) Memory: Appears intact (for tasks during evaluation - prospective recall - slp to obtain pt pen at end of test) Awareness: Appears intact Problem Solving: Appears intact Safety/Judgment: Appears intact Comments: Pt wrote that she can't stand  and was worried she may fall with PT/OT - showing excellent awareness to her defictis Cognition Arousal/Alertness: Awake/alert Behavior During Therapy: WFL for tasks assessed/performed Overall Cognitive Status: Impaired/Different from baseline Area of Impairment: Safety/judgement, Problem solving, Attention Current Attention Level: Selective Safety/Judgement: Decreased awareness of safety Problem Solving: Difficulty sequencing, Requires verbal cues General Comments: Able to communicate her needs very well by writing. Pt with fear during use of mobility equipment -able to be reassured/educated. Following all commands with increased time.    Physical Exam: Blood pressure 138/76, pulse 89, temperature (!) 97.5 F (36.4 C), temperature source Oral, resp. rate 16, height '5\' 6"'$  (1.676 m), weight 65.6 kg, SpO2 98 %.   General: Alert and oriented x 4, No apparent distress HEENT: Head is normocephalic,  atraumatic, PERRLA, EOMI, sclera anicteric, oral mucosa pink and moist, dentition - wears dentures but doesn't have them with her currently, ext ear canals clear Neck: Supple without JVD or lymphadenopathy Heart: Reg rate and rhythm. No murmurs rubs or gallops Chest: CTA bilaterally without wheezes, rales, or rhonchi; no distress Abdomen: Soft, non-tender, non-distended, bowel sounds positive. Extremities: No clubbing, cyanosis, or edema. Pulses are 2+ Psych: Pt's affect is appropriate. Pt is cooperative Skin: Clean and intact without signs of breakdown Neuro:  Left facial weakness, PERRLA,  Tongue protruded to right, EOMI, follows commands, Difficulty turning head to left, Decreased shoulder shrug on left, Expressive aphasia with minimal verbal output, she was able to repeat 1 word today, She is able to answer by writing Sensation decreased to LT on Left face, arm and leg, intact on the right Strength 5/5 in RUE and RLE Strength 0/5 in LLE and LUE FTN intact RUE Musculoskeletal: decreased tone in Left UE and LE No Rom deficits noted No joint swelling or tenderness Decreased muscle bulk  Results for orders placed or performed during the hospital encounter of 07/26/22 (from the past 48 hour(s))  Glucose, capillary     Status: None   Collection Time: 07/31/22 12:08 AM  Result Value Ref Range   Glucose-Capillary 94 70 - 99 mg/dL    Comment: Glucose reference range applies only to samples taken after fasting for at least 8 hours.  Glucose, capillary     Status: Abnormal   Collection Time: 07/31/22  6:08 AM  Result Value Ref Range   Glucose-Capillary 100 (H) 70 - 99 mg/dL    Comment: Glucose reference range applies only to samples taken after fasting for at least 8 hours.  Basic metabolic panel     Status: None   Collection Time: 07/31/22 10:27 AM  Result Value Ref Range   Sodium 143 135 - 145 mmol/L   Potassium 4.1 3.5 - 5.1 mmol/L   Chloride 110 98 - 111 mmol/L   CO2 23 22 - 32 mmol/L    Glucose, Bld 93 70 - 99 mg/dL    Comment: Glucose reference range applies only to samples taken after fasting for at least 8 hours.   BUN 10 8 - 23 mg/dL   Creatinine, Ser 0.90 0.44 - 1.00 mg/dL   Calcium 9.8 8.9 - 10.3 mg/dL   GFR, Estimated >60 >60 mL/min    Comment: (NOTE) Calculated using the CKD-EPI Creatinine Equation (2021)    Anion gap 10 5 - 15    Comment: Performed at [facility] [address], [place], [place] [address]  Magnesium     Status: None   Collection Time: 07/31/22 10:27 AM  Result Value Ref Range  Magnesium 2.4 1.7 - 2.4 mg/dL    Comment: Performed at [facility] [place] [address], [place], [place] [address]  Glucose, capillary     Status: None   Collection Time: 07/31/22 12:01 PM  Result Value Ref Range   Glucose-Capillary 97 70 - 99 mg/dL    Comment: Glucose reference range applies only to samples taken after fasting for at least 8 hours.   Comment 1 Notify RN    Comment 2 Document in Chart   Glucose, capillary     Status: Abnormal   Collection Time: 07/31/22  4:56 PM  Result Value Ref Range   Glucose-Capillary 102 (H) 70 - 99 mg/dL    Comment: Glucose reference range applies only to samples taken after fasting for at least 8 hours.   Comment 1 Notify RN    Comment 2 Document in Chart   Glucose, capillary     Status: Abnormal   Collection Time: 08/01/22 12:17 AM  Result Value Ref Range   Glucose-Capillary 108 (H) 70 - 99 mg/dL    Comment: Glucose reference range applies only to samples taken after fasting for at least 8 hours.  Glucose, capillary     Status: None   Collection Time: 08/01/22  6:06 AM  Result Value Ref Range   Glucose-Capillary 89 70 - 99 mg/dL    Comment: Glucose reference range applies only to samples taken after fasting for at least 8 hours.  Glucose, capillary     Status: Abnormal   Collection Time: 08/01/22 12:26 PM  Result Value Ref Range   Glucose-Capillary 125 (H) 70 - 99 mg/dL    Comment: Glucose reference  range applies only to samples taken after fasting for at least 8 hours.  Glucose, capillary     Status: None   Collection Time: 08/01/22  5:21 PM  Result Value Ref Range   Glucose-Capillary 99 70 - 99 mg/dL    Comment: Glucose reference range applies only to samples taken after fasting for at least 8 hours.   No results found.    There were no vitals taken for this visit.  Medical Problem List and Plan: 1. Functional deficits secondary to ischemic small vessel disease of the right corona radiata, tracking toward the posterior right lentiform with left hemiparesis and dysphagia  -patient may shower  -ELOS/Goals: 18-21 days Min A with PT/OT/SLP  -Admit to CIR 2.  Antithrombotics: -DVT/anticoagulation:  Pharmaceutical: Lovenox  -antiplatelet therapy: continue Plavix and aspirin as PTA 3. Pain Management: tylenol as needed 4. Mood/Behavior/Sleep: LCSW to evaluate and provide emotional support  -antipsychotic agents: n/a  -bupropion ER 150 mg q AM  -mirtazapine 30 mg q HS  -alprazolam 0.5 mg daily prn anxiety 5. Neuropsych/cognition: This patient is capable of making decisions on her own behalf. 6. Skin/Wound Care: routine skin care checks 7. Fluids/Electrolytes/Nutrition: routine Is and Os and follow-up chemistries  Dysphagia 1 diet/thin-SLP eval 8: Left-sided weakness/decreased sensation: check LLE venous duplex 9: Hypertension: continue to monitor; BP mildly elevated but stable overall, avoid hypotension  -continue Cardizem 180 mg daily 10: Hyperlipidemia: LDL = 125, counsel regarding healthy diet  -continue  ezetimibe 10 mg daily  -continue atorvastatin 90 mg daily 11: Tobacco use: cessation counseling  -continue nicotine patch 21 mg 12. Constipation  -Order sorbitol 13. GERD  -Continue PPI   I have personally performed a face to face diagnostic evaluation of this patient and formulated the key components of the plan.  Additionally, I have personally reviewed laboratory  data, imaging studies, as well as relevant notes and concur with the physician assistant's documentation above.  The patient's status has not changed from the original H&P.  Any changes in documentation from the acute care chart have been noted above.  Jennye Boroughs, MD, [facility]   Jennye Boroughs, MD 08/01/2022

## 2022-08-01 NOTE — Plan of Care (Signed)
Pt. Knows she should call 911 should any new stroke symptoms appear and nods showing she understands what those symptoms are.  Pt. Also nods and agrees to follow all regimens to decrease chance of another stroke  Problem: Education: Goal: Knowledge of secondary prevention will improve (SELECT ALL) 08/01/2022 1231 by Larry Sierras, RN Outcome: Progressing 08/01/2022 1230 by Larry Sierras, RN Outcome: Progressing   Problem: Education: Goal: Knowledge of secondary prevention will improve (SELECT ALL) Outcome: Progressing Goal: Knowledge of patient specific risk factors will improve (INDIVIDUALIZE FOR PATIENT) Outcome: Progressing

## 2022-08-01 NOTE — Progress Notes (Signed)
Inpatient Rehab Admissions Coordinator:    I have a CIR bed for this Pt. Today and can admit. RN may call report to [place], [place], [place] Admissions Coordinator  [phone] ([place]) [phone] (office)

## 2022-08-01 NOTE — Progress Notes (Signed)
Physical Therapy Treatment Patient Details Name: Kara Bailey MRN: [id number] DOB: 16-Apr-1956 Today's Date: 08/01/2022   History of Present Illness Pt is 65 y.o. female who to [facility] ED with L sided weaqkness and slurred speech on 07/26/22. 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 PMH significant for prior L thalamic CVA, HTN, tobacco abuse, PAD, and emphysema.    PT Comments    Patient remains very alert and followed all verbal commands. Session focused on trunk activation, sitting balance, activating Lt extremities, and standing balance.    Recommendations for follow up therapy are one component of a multi-disciplinary discharge planning process, led by the attending physician.  Recommendations may be updated based on patient status, additional functional criteria and insurance authorization.  Follow Up Recommendations  Acute inpatient rehab (3hours/day)     Assistance Recommended at Discharge Frequent or constant Supervision/Assistance  Patient can return home with the following Assistance with cooking/housework;A lot of help with bathing/dressing/bathroom;A lot of help with walking and/or transfers;Assist for transportation;Help with stairs or ramp for entrance   Equipment Recommendations  Other (comment) (TBD)    Recommendations for Other Services       Precautions / Restrictions Precautions Precautions: Fall;Other (comment) Precaution Comments: L hemi Restrictions Weight Bearing Restrictions: No     Mobility  Bed Mobility Overal bed mobility: Needs Assistance Bed Mobility: Supine to Sit, Sit to Supine Rolling: Supervision (to her left)   Supine to sit: Mod assist Sit to supine: Max assist   General bed mobility comments: able to scoot towards EOB in supine using RLE; assisted LLE over EOB and pt then able to partially come to sit by pulling on rail with RUE; assist to support trunk and  elevate trunk to upright as pt released rail to finish come to sit; return to supine, trunk lowering and protection of LUE followed by assist to lift LLE onto bed    Transfers Overall transfer level: Needs assistance   Transfers: Sit to/from Stand Sit to Stand: Mod assist           General transfer comment: Performed sit to stand from EOB with gait belt and PT facing pt. Repeated x 2 with total support to LLE to prevent buckling.    Ambulation/Gait                   Stairs             Wheelchair Mobility    Modified Rankin (Stroke Patients Only) Modified Rankin (Stroke Patients Only) Pre-Morbid Rankin Score: No symptoms Modified Rankin: Severe disability     Balance Overall balance assessment: Needs assistance Sitting-balance support: Single extremity supported, Feet supported, No upper extremity supported Sitting balance-Leahy Scale: Poor Sitting balance - Comments: Worked on EOB balance with visual feedback from therapist.  Pt mostly using R UE but was briefly able to place R UE in lap.  Tendency to lean left but can correct with verbal cues.  Able to hold nearly midline x 60 seconds with Rt hand in lap. Able to progress to overhead reaching with RUE x 10 and maintain balance with minguard assist.   Standing balance support: Single extremity supported, During functional activity Standing balance-Leahy Scale: Poor                              Cognition Arousal/Alertness: Awake/alert Behavior During Therapy: [facility] for  tasks assessed/performed Overall Cognitive Status: Difficult to assess                                 General Comments: following all commands; using good problem-solving for sitting balance        Exercises General Exercises - Lower Extremity Ankle Circles/Pumps: PROM, Left, 5 reps, Supine Other Exercises Other Exercises: supine glute squeezes with ?trace activation on left (especially with 1/2 bridging with  RLE flexed) Other Exercises: AAROM heelslides with no flexion activity noted and ?trace hip extension noted; seated LUE shoulder shrug, scapular retraction and protraction all with ?trace activation    General Comments        Pertinent Vitals/Pain Pain Assessment Pain Assessment: Faces Faces Pain Scale: No hurt    Home Living                          Prior Function            PT Goals (current goals can now be found in the care plan section) Acute Rehab PT Goals Patient Stated Goal: to return to independent Time For Goal Achievement: 08/10/22 Potential to Achieve Goals: Good Progress towards PT goals: Progressing toward goals    Frequency    Min 4X/week      PT Plan Current plan remains appropriate    Co-evaluation              AM-PAC PT "6 Clicks" Mobility   Outcome Measure  Help needed turning from your back to your side while in a flat bed without using bedrails?: A Lot Help needed moving from lying on your back to sitting on the side of a flat bed without using bedrails?: A Lot Help needed moving to and from a bed to a chair (including a wheelchair)?: Total Help needed standing up from a chair using your arms (e.g., wheelchair or bedside chair)?: A Lot Help needed to walk in hospital room?: Total Help needed climbing 3-5 steps with a railing? : Total 6 Click Score: 9    End of Session Equipment Utilized During Treatment: Gait belt Activity Tolerance: Patient tolerated treatment well Patient left: in bed;with nursing/sitter in room Nurse Communication: Mobility status PT Visit Diagnosis: Other abnormalities of gait and mobility (R26.89);Hemiplegia and hemiparesis;Muscle weakness (generalized) (M62.81) Hemiplegia - Right/Left: Left Hemiplegia - dominant/non-dominant: Non-dominant Hemiplegia - caused by: Cerebral infarction     Time: 1200-1222 PT Time Calculation (min) (ACUTE ONLY): 22 min  Charges:  $Neuromuscular Re-education: 8-22  mins                      [place]  Office [phone]    Rexanne Mano 08/01/2022, 12:33 PM

## 2022-08-01 NOTE — TOC Transition Note (Signed)
Transition of Care [facility]) - CM/SW Discharge Note   Patient Details  Name: Kara Bailey MRN: [id number] Date of Birth: 1955/12/08  [facility] CM/SW Contact:  Pollie Friar, RN Phone Number: 08/01/2022, 11:11 AM   Clinical Narrative:    Pt is discharging to CIR today. CM signing off.    Final next level of care: IP Rehab Facility Barriers to Discharge: No Barriers Identified   Patient Goals and CMS Choice   CMS Medicare.gov Compare Post Acute Care list provided to:: Patient Choice offered to / list presented to : Patient, Adult Children  Discharge Placement                       Discharge Plan and Services   Discharge Planning Services: CM Consult Post Acute Care Choice: IP Rehab                               Social Determinants of Health (SDOH) Interventions     Readmission Risk Interventions     No data to display

## 2022-08-01 NOTE — Discharge Summary (Signed)
Physician Discharge Summary  Kara Bailey WEX:[id number] DOB: 01-20-1956 DOA: 07/26/2022  PCP: Andree Moro, DO  Admit date: 07/26/2022 Discharge date: 08/01/2022  Admitted From: Home Disposition: Inpatient rehab   Recommendations for Outpatient Follow-up:  Follow up with PCP in 1-2 weeks Please obtain BMP/CBC in one week   Home Health:No Equipment/Devices:None  Discharge Condition:Stable CODE STATUS:Full Diet recommendation: Heart Healthy  Brief/Interim Summary:  66 y.o. female past medical history of CVA, essential hypertension comes into the ED for CVA, CT of the head showed advanced chronic small vessel disease, CTA of the head and neck showed no large vessel occlusion MRI confirms CVA  Discharge Diagnoses:  Principal Problem:   Stroke [facility]) Active Problems:   Left hemiplegia (HCC)   Smoking   Essential hypertension   CVA (cerebral vascular accident) ([place])  Acute CVA: MRI of the brain showed acute on chronic small vessel ischemic in the right corona radiata. 2D echo showed an EF of 75%. Neurology was consulted recommended to continue aspirin and Plavix which she was taking prior to admission. Continue high-dose statins. A1c was 5.4. Physical therapy evaluated the patient recommended inpatient rehab.  Essential hypertension: Permissive hypertension was allowed she could resume her home medication at CIR.  Dysphagia: Speech evaluated the patient, now on dysphagia 1 diet.  Tobacco abuse: Continue nicotine patch.   Discharge Instructions  Discharge Instructions     Diet - low sodium heart healthy   Complete by: As directed    Increase activity slowly   Complete by: As directed       Allergies as of 08/01/2022   No Known Allergies      Medication List     TAKE these medications    albuterol 108 (90 Base) MCG/ACT inhaler Commonly known as: VENTOLIN HFA Inhale 2 puffs into the lungs every 6 (six) hours as needed for shortness of breath or  wheezing.   aspirin EC 81 MG tablet Take 1 tablet (81 mg total) by mouth daily. Swallow whole.   atorvastatin 80 MG tablet Commonly known as: LIPITOR Take 1 tablet (80 mg total) by mouth daily. What changed: when to take this   buPROPion 150 MG 12 hr tablet Commonly known as: ZYBAN Take 150 mg by mouth daily as needed (to stop smoking).   clopidogrel 75 MG tablet Commonly known as: PLAVIX TAKE 1 TABLET EVERY DAY   diltiazem 180 MG 24 hr capsule Commonly known as: CARDIZEM CD Take 180 mg by mouth daily.   ezetimibe 10 MG tablet Commonly known as: ZETIA Take 10 mg by mouth daily.   famotidine 40 MG tablet Commonly known as: PEPCID Take 40 mg by mouth at bedtime.   hydrOXYzine 50 MG tablet Commonly known as: ATARAX Take 50 mg by mouth 3 (three) times daily as needed for anxiety.   mirtazapine 30 MG tablet Commonly known as: REMERON Take 0.5 tablets (15 mg total) by mouth at bedtime. What changed: how much to take   multivitamin with minerals Tabs tablet Take 1 tablet by mouth daily.   nicotine 14 mg/24hr patch Commonly known as: NICODERM CQ - dosed in mg/24 hours Place 14 mg onto the skin daily as needed (nicotine dependence).   pantoprazole 40 MG tablet Commonly known as: PROTONIX Take 1 tablet (40 mg total) by mouth daily.        No Known Allergies  Consultations: Neurology   Procedures/Studies: DG Swallowing Func-Speech Pathology  Result Date: 07/27/2022 Table formatting from the original result was not included. Objective  Swallowing Evaluation: Type of Study: Bedside Swallow Evaluation  Patient Details Name: Kara Bailey MRN: [id number] Date of Birth: 1956-07-09 Today's Date: 07/27/2022 Time: SLP Start Time (ACUTE ONLY): 20 -SLP Stop Time (ACUTE ONLY): 3016 SLP Time Calculation (min) (ACUTE ONLY): 23 min Past Medical History: Past Medical History: Diagnosis Date  Anxiety   Chest pain 08/2014  unspecified  Cirrhosis ([place])   Depression   Dizziness  08/2014  Dyspnea 08/2014  Dysrhythmia   Emphysema lung (HCC)   GERD (gastroesophageal reflux disease)   Heart murmur   Hepatitis C   Hiatal hernia   Hypertension   PAC (premature atrial contraction) 10/11/2014  PAD (peripheral artery disease) (HCC)   Palpitations   PVC (premature ventricular contraction) 10/11/2014  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  Stroke ([place])   just slurred speech  Tachycardia 08/2014  Ventral hernia   Weakness 08/2014 Past Surgical History: Past Surgical History: Procedure Laterality Date  ABDOMINAL AORTOGRAM W/LOWER EXTREMITY N/A 07/13/2021  Procedure: ABDOMINAL AORTOGRAM W/LOWER EXTREMITY;  Surgeon: Lorretta Harp, MD;  Location: [place] [facility];  Service: Cardiovascular;  Laterality: N/A;  ABDOMINAL AORTOGRAM W/LOWER EXTREMITY N/A 03/19/2022  Procedure: ABDOMINAL AORTOGRAM W/LOWER EXTREMITY;  Surgeon: Lorretta Harp, MD;  Location: [place] [facility];  Service: Cardiovascular;  Laterality: N/A;  APPENDECTOMY    CATARACT EXTRACTION, BILATERAL    COLONOSCOPY    COLONOSCOPY WITH PROPOFOL N/A 04/30/2016  Procedure: COLONOSCOPY WITH PROPOFOL;  Surgeon: Daneil Dolin, MD;  [facility];  Service: Endoscopy;  Laterality: N/A;  [place] N/A 10/13/2021  Procedure: OPEN REPAIR EPIGASTRIC HERNIA WITH MESH PATCH;  Surgeon: Armandina Gemma, MD;  Location: [facility];  Service: General;  Laterality: N/A;  ESOPHAGOGASTRODUODENOSCOPY    approximately 2010  ESOPHAGOGASTRODUODENOSCOPY (EGD) WITH PROPOFOL N/A 04/30/2016  Procedure: ESOPHAGOGASTRODUODENOSCOPY (EGD) WITH PROPOFOL;  Surgeon: Daneil Dolin, MD;  [facility];  Service: Endoscopy;  Laterality: N/A;  LEFT HEART CATHETERIZATION WITH CORONARY ANGIOGRAM N/A 10/29/2014  07-14-20- pt denies this Procedure: LEFT HEART CATHETERIZATION WITH CORONARY ANGIOGRAM;  Surgeon: Burnell Blanks, MD;  Location: [facility] CATH LAB;  Service: Cardiovascular;  Laterality: N/A;  MALONEY DILATION N/A 04/30/2016   Procedure: Venia Minks DILATION;  Surgeon: Daneil Dolin, MD;  [facility];  Service: Endoscopy;  Laterality: N/A;  PERIPHERAL VASCULAR INTERVENTION  07/13/2021  Procedure: PERIPHERAL VASCULAR INTERVENTION;  Surgeon: Lorretta Harp, MD;  Location: [place] [facility];  Service: Cardiovascular;;  left SFA left external iliac  PERIPHERAL VASCULAR INTERVENTION Right 03/19/2022  Procedure: PERIPHERAL VASCULAR INTERVENTION;  Surgeon: Lorretta Harp, MD;  Location: [place] [facility];  Service: Cardiovascular;  Laterality: Right;  SFA  POLYPECTOMY  04/30/2016  Procedure: POLYPECTOMY;  Surgeon: Daneil Dolin, MD;  [facility];  Service: Endoscopy;;  Sigmoid colon polyp removed via hot snare  UPPER GASTROINTESTINAL ENDOSCOPY   HPI: 66 yo female adm [facility] with left hemiparesis and fall.  MRI showed patchy acute on chronic small vessel ischemia in the right corona  radiata, tracking toward the posterior right lentiform. No associated hemorrhage or mass effect.  Pt has h/o CVA with dysarthria, dysphagia. She failed [facility] swallow screen and SLP eval ordered.  Pt admits to some history of dysphagia - coughing with intake and sensing pills sticking in her esophagus.  H/O esophageal dilatation in 2021 - which she reported helped her swallowing. MBS February 2022 recommending Dys 3 diet and nectar thick liquids with one instance of  aspiration (PAS 7) and additional penetration (PAS 3) but with coughing noted clinically. Repeat MBS April 2022 showed improved airway protection (PAS 2) with recommendation for regular solids and thin liquids. PMH includes: anxiety, CP, depression, GERD, HH, stroke, emphysema, PAD, smoker  Subjective: intermittently tearful  Recommendations for follow up therapy are one component of a multi-disciplinary discharge planning process, led by the attending physician.  Recommendations may be updated based on patient status, additional functional criteria and insurance authorization.  Assessment / Plan / Recommendation   07/27/2022   1:00 PM Clinical Impressions Clinical Impression Pt presents with an oral more than pharyngeal dysphagia. She has a lot of difficulty achieving labial seal and sucking to get liquids from a cup or straw, ultimately most successful with use of a straw placed on the R side of her mouth. Thicker liquids were more challenging to get up through a straw still. She has transient oral holding as she seems to be trying to initiate posterior transit with liquids spilling under her tongue. Mastication is prolonged, effortful, and incomplete, but oral residue across textures is fairly mild. Her pharyngeal function is relatively intact although there are moments of trace, transient penetration (PAS 2) that seem to be a result more so of oral loss prior to the swallow. This occurs with both thin and nectar thick liquids, so would recommend starting with thin liquids for now. Discussed solids with pt who would like to start with purees for ease of oral preparation and clearance. SLP Visit Diagnosis Dysphagia, oral phase (R13.11) Impact on safety and function Mild aspiration risk;Risk for inadequate nutrition/hydration     07/27/2022   1:00 PM Treatment Recommendations Treatment Recommendations Therapy as outlined in treatment plan below     07/27/2022   1:00 PM Prognosis Prognosis for Safe Diet Advancement Good   07/27/2022   1:00 PM Diet Recommendations SLP Diet Recommendations Dysphagia 1 (Puree) solids;Thin liquid Liquid Administration via Straw Medication Administration Whole meds with puree Compensations Slow rate;Small sips/bites;Lingual sweep for clearance of pocketing;Monitor for anterior loss Postural Changes Seated upright at 90 degrees;Remain semi-upright after after feeds/meals (Comment)     07/27/2022   1:00 PM Other Recommendations Oral Care Recommendations Oral care BID Follow Up Recommendations Acute inpatient rehab (3hours/day) Assistance recommended at discharge  Frequent or constant Supervision/Assistance Functional Status Assessment Patient has had a recent decline in their functional status and demonstrates the ability to make significant improvements in function in a reasonable and predictable amount of time.   07/27/2022   1:00 PM Frequency and Duration  Speech Therapy Frequency (ACUTE ONLY) min 2x/week Treatment Duration 2 weeks     07/27/2022   1:00 PM Oral Phase Oral Phase Impaired Oral - Nectar Straw Reduced posterior propulsion;Weak lingual manipulation;Delayed oral transit Oral - Thin Straw Reduced posterior propulsion;Delayed oral transit;Decreased bolus cohesion;Lingual/palatal residue;Pocketing in anterior sulcus;Premature spillage;Left anterior bolus loss Oral - Puree Reduced posterior propulsion;Delayed oral transit Oral - Regular Impaired mastication;Weak lingual manipulation;Reduced posterior propulsion;Delayed oral transit;Lingual/palatal residue    07/27/2022   1:00 PM Pharyngeal Phase Pharyngeal Phase [facility]    07/27/2022   1:00 PM Cervical Esophageal Phase  Cervical Esophageal Phase [facility] Osie Bond., M.A. [place] Office [phone] Secure chat preferred 07/27/2022, 2:03 PM                     ECHOCARDIOGRAM COMPLETE  Result Date: 07/26/2022    ECHOCARDIOGRAM REPORT   Patient Name:   Kara Bailey Date of Exam: 07/26/2022 Medical  Rec #:  [id number]        Height:       66.0 in Accession #:    [id number]       Weight:       140.0 lb Date of Birth:  March 02, 1956         BSA:          1.719 m Patient Age:    40 years         BP:           167/73 mmHg Patient Gender: F                HR:           89 bpm. Exam Location:  Inpatient Procedure: 2D Echo, Color Doppler, Cardiac Doppler and Intracardiac            Opacification Agent Indications:    Stroke  History:        Patient has prior history of Echocardiogram examinations, most                 recent 12/30/2020. Stroke, Arrythmias:Tachycardia and PVC,                  Signs/Symptoms:Chest Pain and Murmur; Risk Factors:Hypertension.  Sonographer:    Memory Argue Referring Phys: [id number] [place]  1. Left ventricular ejection fraction, by estimation, is >75%. Left ventricular ejection fraction by PLAX is 75 %. The left ventricle has hyperdynamic function. The left ventricle has no regional wall motion abnormalities. There is mild left ventricular  hypertrophy. Left ventricular diastolic parameters are consistent with Grade I diastolic dysfunction (impaired relaxation).  2. Right ventricular systolic function is normal. The right ventricular size is normal. Tricuspid regurgitation signal is inadequate for assessing PA pressure.  3. Left atrial size was mildly dilated.  4. The mitral valve is grossly normal. Trivial mitral valve regurgitation.  5. The aortic valve is tricuspid. Aortic valve regurgitation is not visualized. Aortic valve sclerosis/calcification is present, without any evidence of aortic stenosis. Aortic valve mean gradient measures 8.0 mmHg.  6. The inferior vena cava is normal in size with greater than 50% respiratory variability, suggesting right atrial pressure of 3 mmHg. Comparison(s): Changes from prior study are noted. 12/30/2020: LVEF >75%, negative bubble study for interatrial shunt. FINDINGS  Left Ventricle: Left ventricular ejection fraction, by estimation, is >75%. Left ventricular ejection fraction by PLAX is 75 %. The left ventricle has hyperdynamic function. The left ventricle has no regional wall motion abnormalities. The left ventricular internal cavity size was normal in size. There is mild left ventricular hypertrophy. Left ventricular diastolic parameters are consistent with Grade I diastolic dysfunction (impaired relaxation). Indeterminate filling pressures. Right Ventricle: The right ventricular size is normal. No increase in right ventricular wall thickness. Right ventricular systolic function is normal. Tricuspid  regurgitation signal is inadequate for assessing PA pressure. Left Atrium: Left atrial size was mildly dilated. Right Atrium: Right atrial size was normal in size. Pericardium: There is no evidence of pericardial effusion. Mitral Valve: The mitral valve is grossly normal. Trivial mitral valve regurgitation. Tricuspid Valve: The tricuspid valve is normal in structure. Tricuspid valve regurgitation is not demonstrated. Aortic Valve: The aortic valve is tricuspid. Aortic valve regurgitation is not visualized. Aortic valve sclerosis/calcification is present, without any evidence of aortic stenosis. Aortic valve mean gradient measures 8.0 mmHg. Aortic valve peak gradient measures 11.8 mmHg. Aortic valve area, by VTI measures 2.23 cm. Pulmonic Valve: The pulmonic valve  was normal in structure. Pulmonic valve regurgitation is not visualized. Aorta: The aortic root and ascending aorta are structurally normal, with no evidence of dilitation. Venous: The inferior vena cava is normal in size with greater than 50% respiratory variability, suggesting right atrial pressure of 3 mmHg. IAS/Shunts: The interatrial septum was not well visualized.  LEFT VENTRICLE PLAX 2D LV EF:         Left            Diastology                ventricular     LV e' medial:    6.20 cm/s                ejection        LV E/e' medial:  8.2                fraction by     LV e' lateral:   4.79 cm/s                PLAX is 75      LV E/e' lateral: 10.6                %. LVIDd:         3.50 cm LVIDs:         2.00 cm LV PW:         1.00 cm LV IVS:        1.20 cm LVOT diam:     1.80 cm LV SV:         80 LV SV Index:   46 LVOT Area:     2.54 cm  RIGHT VENTRICLE RV S prime:     12.00 cm/s TAPSE (M-mode): 1.8 cm LEFT ATRIUM             Index        RIGHT ATRIUM           Index LA Vol (A2C):   37.7 ml 21.94 ml/m  RA Area:     10.00 cm LA Vol (A4C):   32.3 ml 18.80 ml/m  RA Volume:   19.50 ml  11.35 ml/m LA Biplane Vol: 34.8 ml 20.25 ml/m  AORTIC VALVE AV  Area (Vmax):    2.41 cm AV Area (Vmean):   2.18 cm AV Area (VTI):     2.23 cm AV Vmax:           172.00 cm/s AV Vmean:          131.000 cm/s AV VTI:            0.358 m AV Peak Grad:      11.8 mmHg AV Mean Grad:      8.0 mmHg LVOT Vmax:         163.00 cm/s LVOT Vmean:        112.000 cm/s LVOT VTI:          0.314 m LVOT/AV VTI ratio: 0.88  AORTA Ao Root diam: 3.00 cm MITRAL VALVE MV Area (PHT): 2.92 cm    SHUNTS MV Decel Time: 260 msec    Systemic VTI:  0.31 m MV E velocity: 50.60 cm/s  Systemic Diam: 1.80 cm MV A velocity: 97.70 cm/s MV E/A ratio:  0.52 Lyman Bishop MD Electronically signed by Lyman Bishop MD Signature Date/Time: 07/26/2022/4:37:39 PM    Final    MR BRAIN WO CONTRAST  Result Date: 07/26/2022 CLINICAL DATA:  66 year old female code stroke presentation  this morning. EXAM: MRI HEAD WITHOUT CONTRAST TECHNIQUE: Multiplanar, multiecho pulse sequences of the brain and surrounding structures were obtained without intravenous contrast. COMPARISON:  CT head and CTA head and neck earlier today. Previous brain MRI 12/29/2020. FINDINGS: Brain: Patchy restricted diffusion tracking from the posterior right corona radiata toward the posterior right lentiform (series 2, image 29). This is adjacent to chronic infarct with encephalomalacia in the anterior and central corona radiata. No acute hemorrhage or mass effect. No other restricted diffusion. And elsewhere stable advanced chronic mostly small vessel ischemic disease throughout the bilateral deep gray nuclei, deep white matter capsules, and brainstem. Chronic hemorrhagic right superior cerebellar infarct is unchanged. Smaller chronic cerebellar lacunar infarcts mostly in the right PICA territory. And there are occasional other chronic microhemorrhages in the brain (left anterior frontal lobe white matter series 7, image 56). No midline shift, mass effect, evidence of mass lesion, ventriculomegaly, extra-axial collection or acute intracranial hemorrhage.  Cervicomedullary junction and pituitary are within normal limits.- Vascular: Major intracranial vascular flow voids are stable. Skull and upper cervical spine: Congenital incomplete segmentation of C2-C3. Otherwise negative for age visible cervical spine. Visualized bone marrow signal is within normal limits. Sinuses/Orbits: Stable, negative. Other: Visible internal auditory structures appear normal. Negative visible scalp and face. IMPRESSION: 1. Patchy acute on chronic small vessel ischemia in the right corona radiata, tracking toward the posterior right lentiform. No associated hemorrhage or mass effect. 2. Otherwise stable advanced chronic small vessel disease and chronic hemorrhagic right superior cerebellar infarct. Electronically Signed   By: Genevie Ann M.D.   On: 07/26/2022 09:52   CT ANGIO HEAD NECK W WO CM (CODE STROKE)  Result Date: 07/26/2022 CLINICAL DATA:  66 year old female code stroke presentation. EXAM: CT ANGIOGRAPHY HEAD AND NECK TECHNIQUE: Multidetector CT imaging of the head and neck was performed using the standard protocol during bolus administration of intravenous contrast. Multiplanar CT image reconstructions and MIPs were obtained to evaluate the vascular anatomy. Carotid stenosis measurements (when applicable) are obtained utilizing NASCET criteria, using the distal internal carotid diameter as the denominator. RADIATION DOSE REDUCTION: This exam was performed according to the departmental dose-optimization program which includes automated exposure control, adjustment of the mA and/or kV according to patient size and/or use of iterative reconstruction technique. CONTRAST:  2m OMNIPAQUE IOHEXOL 350 MG/ML SOLN COMPARISON:  CTA head and neck 12/29/2020. FINDINGS: CTA NECK Skeleton: Absent dentition and chronic straightening of cervical lordosis. Congenital incomplete segmentation of C2-C3. No acute osseous abnormality identified. Upper chest: Stable mild apical lung scarring and/or  atelectasis. Negative visible superior mediastinum. Other neck: Stable, negative. Aortic arch: 3 vessel arch configuration. Chronic soft and calcified arch atherosclerosis appears stable from last year. Right carotid system: Soft brachiocephalic artery plaque without stenosis. Patent right CCA origin. Proximal right CCA soft plaque appears stable without stenosis. Patent right carotid bifurcation. Chronic soft plaque in the posterior proximal right ICA without stenosis. Left carotid system: Stable soft plaque most pronounced at the left ICA origin. No significant stenosis. Vertebral arteries: Soft and calcified plaque in the proximal right subclavian artery is stable. Right vertebral artery origin remains patent without stenosis. Dominant right vertebral artery is stable and patent to the skull base without stenosis. Proximal left subclavian artery moderate soft plaque with less than 50% stenosis appears stable. Non dominant left vertebral artery origin remains patent and within normal limits. Non dominant left vertebral artery is stable and patent to the skull base. CTA HEAD Posterior circulation: Dominant right vertebral artery remains patent to  the vertebrobasilar junction. Mild right V4 irregularity but no significant stenosis. Right PICA at arises from the right V3 segment just outside the skull and is patent. Non dominant distal left vertebral artery is stable and patent to the vertebrobasilar junction. Left AICA appears dominant. Patent basilar artery with mild tortuosity, no significant stenosis. Left SCA and bilateral PCA origins are patent and within normal limits. Right SCA chronically not visualized. Posterior communicating arteries are diminutive or absent. Chronic bilateral PCA branch irregularity appears moderate in the right P2 segment and somewhat progressed from last year (series 10, image 21). Moderate right P3 and mild left P2/P3 irregularity and stenosis appears stable. Anterior circulation:  Both ICA siphons are patent. Only mild siphon plaque. No siphon stenosis. Patent carotid termini, MCA and ACA origins. Dominant right ACA A1 redemonstrated. Anterior communicating artery and bilateral ACA branches remain patent. Mild to moderate bilateral A2 segment irregularity and stenosis appears stable since last year (series 12, image 16), most pronounced in the distal left A2 segment. Left MCA M1 segment and bifurcation remain patent without stenosis. Left MCA branches are stable and relatively normal. Normal right MCA origin. But moderate irregularity in the distal M1 segment to the level of the bifurcation is chronic but progressed from last year (series 11, image 19). Despite this the right MCA bifurcation is patent. And there is similar progressed moderate irregularity and stenosis of the dominant posterior M2 branch (series 12, image 10). No discrete right MCA branch occlusion identified. Venous sinuses: Early contrast timing, grossly patent. Anatomic variants: Dominant right vertebral artery. Dominant right ACA A1. Review of the MIP images confirms the above findings Salient Right MCA findings were discussed by telephone with Dr. Jefferey Pica 07/26/2022 at 05:16 . IMPRESSION: 1. Negative for large vessel occlusion. 2. But positive for intracranial atherosclerosis, with some progression since the CTA last year: - increased moderate irregularity and stenosis of the distal Right M1 and dominant posterior M2 branch. - similar increased and moderate right PCA P2 stenosis. - stable mild to moderate bilateral ACA A2 and Left PCA irregularity and stenosis. 3. No extracranial hemodynamically significant stenosis despite atherosclerotic plaque. 4. Aortic Atherosclerosis (ICD10-I70.0). Electronically Signed   By: Genevie Ann M.D.   On: 07/26/2022 05:27   CT HEAD CODE STROKE WO CONTRAST  Result Date: 07/26/2022 CLINICAL DATA:  Code stroke.  66 year old female. EXAM: CT HEAD WITHOUT CONTRAST TECHNIQUE: Contiguous  axial images were obtained from the base of the skull through the vertex without intravenous contrast. RADIATION DOSE REDUCTION: This exam was performed according to the departmental dose-optimization program which includes automated exposure control, adjustment of the mA and/or kV according to patient size and/or use of iterative reconstruction technique. COMPARISON:  Brain MRI 12/29/2020.  Head CT 05/28/2022. FINDINGS: Brain: Chronic small-vessel ischemia. Cystic encephalomalacia most pronounced in the anterior right basal ganglia and/or deep white matter capsules. But heterogeneity throughout the bilateral deep gray nuclei. Right brainstem Wallerian degeneration. Chronic right superior cerebellar infarct. No midline shift, ventriculomegaly, mass effect, evidence of mass lesion, intracranial hemorrhage or evidence of cortically based acute infarction. Vascular: No suspicious intracranial vascular hyperdensity. Skull: Stable.  No acute osseous abnormality identified. Sinuses/Orbits: Visualized paranasal sinuses and mastoids are stable and well aerated. Other: No acute orbit or scalp soft tissue finding. ASPECTS [facility] Stroke Program Early CT Score) Total score (0-10 with 10 being normal): 10 IMPRESSION: 1. Advanced chronic small-vessel disease appears stable by CT since July. No acute cortically based infarct or acute intracranial hemorrhage identified. ASPECTS 10. 2.  These results were communicated to Dr. Lorrin Goodell at 4:59 am on 07/26/2022 by text page via the [facility] messaging system. Electronically Signed   By: Genevie Ann M.D.   On: 07/26/2022 04:59   (Echo, Carotid, EGD, Colonoscopy, ERCP)    Subjective: No complaints.  Discharge Exam: Vitals:   08/01/22 0900 08/01/22 1000  BP:    Pulse:    Resp: (!) 24 17  Temp:    SpO2:     Vitals:   08/01/22 0724 08/01/22 0800 08/01/22 0900 08/01/22 1000  BP: 138/76     Pulse: 89     Resp: 16 15 (!) 24 17  Temp: (!) 97.5 F (36.4 C)     TempSrc: Oral      SpO2: 98%     Weight:      Height:        General: Pt is alert, awake, not in acute distress Cardiovascular: RRR, S1/S2 +, no rubs, no gallops Respiratory: CTA bilaterally, no wheezing, no rhonchi Abdominal: Soft, NT, ND, bowel sounds + Extremities: no edema, no cyanosis    The results of significant diagnostics from this hospitalization (including imaging, microbiology, ancillary and laboratory) are listed below for reference.     Microbiology: No results found for this or any previous visit (from the past 240 hour(s)).   Labs: BNP (last 3 results) No results for input(s): "BNP" in the last 8760 hours. Basic Metabolic Panel: Recent Labs  Lab 07/26/22 0444 07/26/22 0448 07/27/22 1130 07/31/22 1027  NA 141 142 139 143  K 3.6 3.6 4.0 4.1  CL 110 108 108 110  CO2 23  --  23 23  GLUCOSE 102* 101* 96 93  BUN 12 13 6* 10  CREATININE 1.18* 1.00 0.90 0.90  CALCIUM 9.7  --  9.1 9.8  MG  --   --   --  2.4   Liver Function Tests: Recent Labs  Lab 07/26/22 0444 07/27/22 1130  AST 18 16  ALT 13 11  ALKPHOS 84 84  BILITOT 0.5 0.7  PROT 7.1 6.9  ALBUMIN 3.8 3.5   No results for input(s): "LIPASE", "AMYLASE" in the last 168 hours. No results for input(s): "AMMONIA" in the last 168 hours. CBC: Recent Labs  Lab 07/26/22 0444 07/26/22 0448 07/27/22 1130  WBC 11.2*  --  7.5  NEUTROABS 8.1*  --   --   HGB 13.7 13.9 13.9  HCT 41.5 41.0 41.8  MCV 92.0  --  90.5  PLT 247  --  251   Cardiac Enzymes: No results for input(s): "CKTOTAL", "CKMB", "CKMBINDEX", "TROPONINI" in the last 168 hours. BNP: Invalid input(s): "POCBNP" CBG: Recent Labs  Lab 07/31/22 0608 07/31/22 1201 07/31/22 1656 08/01/22 0017 08/01/22 0606  GLUCAP 100* 97 102* 108* 89   D-Dimer No results for input(s): "DDIMER" in the last 72 hours. Hgb A1c No results for input(s): "HGBA1C" in the last 72 hours. Lipid Profile No results for input(s): "CHOL", "HDL", "LDLCALC", "TRIG", "CHOLHDL",  "LDLDIRECT" in the last 72 hours. Thyroid function studies No results for input(s): "TSH", "T4TOTAL", "T3FREE", "THYROIDAB" in the last 72 hours.  Invalid input(s): "FREET3" Anemia work up No results for input(s): "VITAMINB12", "FOLATE", "FERRITIN", "TIBC", "IRON", "RETICCTPCT" in the last 72 hours. Urinalysis    Component Value Date/Time   COLORURINE YELLOW 05/28/2022 1152   APPEARANCEUR CLEAR 05/28/2022 1152   LABSPEC 1.011 05/28/2022 1152   PHURINE 5.0 05/28/2022 1152   GLUCOSEU NEGATIVE 05/28/2022 1152   HGBUR NEGATIVE 05/28/2022 1152  BILIRUBINUR NEGATIVE 05/28/2022 1152   BILIRUBINUR small 06/15/2014 1038   KETONESUR NEGATIVE 05/28/2022 1152   PROTEINUR NEGATIVE 05/28/2022 1152   UROBILINOGEN 0.2 06/15/2014 1038   UROBILINOGEN 1.0 05/04/2013 1637   NITRITE NEGATIVE 05/28/2022 1152   LEUKOCYTESUR NEGATIVE 05/28/2022 1152   Sepsis Labs Recent Labs  Lab 07/26/22 0444 07/27/22 1130  WBC 11.2* 7.5   Microbiology No results found for this or any previous visit (from the past 240 hour(s)).   Time coordinating discharge: Over 30 minutes  SIGNED:   Charlynne Cousins, MD  [facility] 08/01/2022, 10:39 AM Pager   If 7PM-7AM, please contact night-coverage [email or web address] Password [email or web address]

## 2022-08-02 ENCOUNTER — Other Ambulatory Visit: Payer: Self-pay

## 2022-08-02 ENCOUNTER — Inpatient Hospital Stay (HOSPITAL_BASED_OUTPATIENT_CLINIC_OR_DEPARTMENT_OTHER): Payer: Medicare HMO

## 2022-08-02 DIAGNOSIS — R609 Edema, unspecified: Secondary | ICD-10-CM

## 2022-08-02 DIAGNOSIS — I639 Cerebral infarction, unspecified: Secondary | ICD-10-CM | POA: Diagnosis not present

## 2022-08-02 LAB — COMPREHENSIVE METABOLIC PANEL
ALT: 21 U/L (ref 0–44)
AST: 21 U/L (ref 15–41)
Albumin: 3.8 g/dL (ref 3.5–5.0)
Alkaline Phosphatase: 88 U/L (ref 38–126)
Anion gap: 8 (ref 5–15)
BUN: 19 mg/dL (ref 8–23)
CO2: 24 mmol/L (ref 22–32)
Calcium: 9.7 mg/dL (ref 8.9–10.3)
Chloride: 110 mmol/L (ref 98–111)
Creatinine, Ser: 0.99 mg/dL (ref 0.44–1.00)
GFR, Estimated: >60 mL/min (ref >60)
Glucose, Bld: 108 mg/dL — ABNORMAL HIGH (ref 70–99)
Potassium: 4 mmol/L (ref 3.5–5.1)
Sodium: 142 mmol/L (ref 135–145)
Total Bilirubin: 0.4 mg/dL (ref 0.3–1.2)
Total Protein: 7.4 g/dL (ref 6.5–8.1)

## 2022-08-02 LAB — CBC WITH DIFFERENTIAL/PLATELET
Abs Immature Granulocytes: 0.03 10*3/uL (ref 0.00–0.07)
Basophils Absolute: 0 10*3/uL (ref 0.0–0.1)
Basophils Relative: 0 %
Eosinophils Absolute: 0.2 10*3/uL (ref 0.0–0.5)
Eosinophils Relative: 2 %
HCT: 43.6 % (ref 36.0–46.0)
Hemoglobin: 14.2 g/dL (ref 12.0–15.0)
Immature Granulocytes: 0 %
Lymphocytes Relative: 22 %
Lymphs Abs: 2.4 10*3/uL (ref 0.7–4.0)
MCH: 30 pg (ref 26.0–34.0)
MCHC: 32.6 g/dL (ref 30.0–36.0)
MCV: 92 fL (ref 80.0–100.0)
Monocytes Absolute: 1.1 10*3/uL — ABNORMAL HIGH (ref 0.1–1.0)
Monocytes Relative: 10 %
Neutro Abs: 7.4 10*3/uL (ref 1.7–7.7)
Neutrophils Relative %: 66 %
Platelets: 268 10*3/uL (ref 150–400)
RBC: 4.74 MIL/uL (ref 3.87–5.11)
RDW: 14.3 % (ref 11.5–15.5)
WBC: 11.2 10*3/uL — ABNORMAL HIGH (ref 4.0–10.5)
nRBC: 0 % (ref 0.0–0.2)

## 2022-08-02 LAB — MAGNESIUM: Magnesium: 2.3 mg/dL (ref 1.7–2.4)

## 2022-08-02 LAB — PHOSPHORUS: Phosphorus: 3.5 mg/dL (ref 2.5–4.6)

## 2022-08-02 NOTE — Evaluation (Signed)
Occupational Therapy Assessment and Plan  Patient Details  Name: Kara Bailey MRN: [id number] Date of Birth: [birth year]  OT Diagnosis: abnormal posture, disturbance of vision, flaccid hemiplegia and hemiparesis, and hemiplegia affecting non-dominant side Rehab Potential: Rehab Potential (ACUTE ONLY): Good ELOS: 28-30 days   Today's Date: 08/02/2022 OT Individual Time: 2500-3704 OT Individual Time Calculation (min): 75 min     Hospital Problem: Principal Problem:   Ischemic stroke [facility])   Past Medical History:  Past Medical History:  Diagnosis Date   Anxiety    Chest pain 08/2014   unspecified   Cirrhosis ([place])    Depression    Dizziness 08/2014   Dyspnea 08/2014   Dysrhythmia    Emphysema lung (HCC)    GERD (gastroesophageal reflux disease)    Heart murmur    Hepatitis C    Hiatal hernia    Hypertension    PAC (premature atrial contraction) 10/11/2014   PAD (peripheral artery disease) (HCC)    Palpitations    PVC (premature ventricular contraction) 10/11/2014   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   Stroke ([place])    just slurred speech   Tachycardia 08/2014   Ventral hernia    Weakness 08/2014   Past Surgical History:  Past Surgical History:  Procedure Laterality Date   ABDOMINAL AORTOGRAM W/LOWER EXTREMITY N/A 07/13/2021   Procedure: ABDOMINAL AORTOGRAM W/LOWER EXTREMITY;  Surgeon: Lorretta Harp, MD;  Location: [place] CV LAB;  Service: Cardiovascular;  Laterality: N/A;   ABDOMINAL AORTOGRAM W/LOWER EXTREMITY N/A 03/19/2022   Procedure: ABDOMINAL AORTOGRAM W/LOWER EXTREMITY;  Surgeon: Lorretta Harp, MD;  Location: [place] CV LAB;  Service: Cardiovascular;  Laterality: N/A;   APPENDECTOMY     CATARACT EXTRACTION, BILATERAL     COLONOSCOPY     COLONOSCOPY WITH PROPOFOL N/A 04/30/2016   Procedure: COLONOSCOPY WITH PROPOFOL;  Surgeon: Daneil Dolin, MD;  [facility];  Service: Endoscopy;  Laterality: N/A;  [place] N/A 10/13/2021   Procedure: OPEN REPAIR EPIGASTRIC HERNIA WITH MESH PATCH;  Surgeon: Armandina Gemma, MD;  Location: [facility] ORS;  Service: General;  Laterality: N/A;   ESOPHAGOGASTRODUODENOSCOPY     approximately 2010   ESOPHAGOGASTRODUODENOSCOPY (EGD) WITH PROPOFOL N/A 04/30/2016   Procedure: ESOPHAGOGASTRODUODENOSCOPY (EGD) WITH PROPOFOL;  Surgeon: Daneil Dolin, MD;  [facility];  Service: Endoscopy;  Laterality: N/A;   LEFT HEART CATHETERIZATION WITH CORONARY ANGIOGRAM N/A 10/29/2014   07-14-20- pt denies this Procedure: LEFT HEART CATHETERIZATION WITH CORONARY ANGIOGRAM;  Surgeon: Burnell Blanks, MD;  Location: [facility] CATH LAB;  Service: Cardiovascular;  Laterality: N/A;   MALONEY DILATION N/A 04/30/2016   Procedure: Venia Minks DILATION;  Surgeon: Daneil Dolin, MD;  [facility];  Service: Endoscopy;  Laterality: N/A;   PERIPHERAL VASCULAR INTERVENTION  07/13/2021   Procedure: PERIPHERAL VASCULAR INTERVENTION;  Surgeon: Lorretta Harp, MD;  Location: [place] CV LAB;  Service: Cardiovascular;;  left SFA left external iliac   PERIPHERAL VASCULAR INTERVENTION Right 03/19/2022   Procedure: PERIPHERAL VASCULAR INTERVENTION;  Surgeon: Lorretta Harp, MD;  Location: [place] CV LAB;  Service: Cardiovascular;  Laterality: Right;  SFA   POLYPECTOMY  04/30/2016   Procedure: POLYPECTOMY;  Surgeon: Daneil Dolin, MD;  [facility];  Service: Endoscopy;;  Sigmoid colon polyp removed via hot snare   UPPER GASTROINTESTINAL ENDOSCOPY      Assessment & Plan Clinical Impression: Kara Bailey is a 66 year old female with  history of prior left thalamic stroke on aspirin and Plavix who presents with worsened left facial droop, dysarthria and left-sided weakness with decreased sensation on 07/26/2022.  Family reports had mild left sided weakness and mild speech changes at baseline due to prior CVA. Neurology was consulted and MRI was significant for patchy acute  on chronic small vessel ischemia of the right corona radiata and posterior right lentiform.  Etiology likely chronic small vessel disease.  CT head negative for acute CVA, CTA head/neck negative for LVO.  2D echo reveals LVEF of greater than 75% and some small dilatation of left atrium.  Her LDL is 125 and hemoglobin A1c 5.4.  She was started on Lovenox for DVT prophylaxis.  She is currently tolerating dysphagia 1 diet with thin liquids. Report she has not had a BM is several days. The patient requires inpatient physical medicine and rehabilitation evaluations and treatment secondary to dysfunction due to small vessel ischemia of right corona radiata and posterior right lentiform Patient transferred to CIR on 08/01/2022 .    Patient currently requires max with basic self-care skills secondary to abnormal tone, hemianopsia, decreased attention to left, and decreased sitting balance, decreased standing balance, decreased postural control, and hemiplegia.  Prior to hospitalization, patient was fully Independent, working and driving,  Patient will benefit from skilled intervention to increase independence with basic self-care skills prior to discharge home with care partner.  Anticipate patient will require minimal physical assistance and follow up home health.  OT - End of Session Activity Tolerance: Tolerates 10 - 20 min activity with multiple rests Endurance Deficit: Yes OT Assessment Rehab Potential (ACUTE ONLY): Good OT Barriers to Discharge: Decreased caregiver support OT Barriers to Discharge Comments: unclear who will be able to help pt as her SO is disabled and the pt is his caregiver OT Patient demonstrates impairments in the following area(s): Balance;Endurance;Motor;Vision;Perception;Safety;Sensory OT Basic ADL's Functional Problem(s): Grooming;Bathing;Dressing;Toileting OT Transfers Functional Problem(s): Toilet;Tub/Shower OT Additional Impairment(s): Fuctional Use of Upper Extremity OT  Plan OT Intensity: Minimum of 1-2 x/day, 45 to 90 minutes OT Frequency: 5 out of 7 days;Total of 15 hours over 7 days of combined therapies OT Duration/Estimated Length of Stay: 28-30 days OT Treatment/Interventions: Balance/vestibular training;Discharge planning;DME/adaptive equipment instruction;Neuromuscular re-education;Functional mobility training;Patient/family education;Psychosocial support;Self Care/advanced ADL retraining;Therapeutic Activities;Therapeutic Exercise;UE/LE Strength taining/ROM;UE/LE Coordination activities;Visual/perceptual remediation/compensation OT Self Feeding Anticipated Outcome(s): set up OT Basic Self-Care Anticipated Outcome(s): min A OT Toileting Anticipated Outcome(s): min A OT Bathroom Transfers Anticipated Outcome(s): Min A OT Recommendation Patient destination: Home Follow Up Recommendations: Home health OT Equipment Recommended: To be determined Equipment Details: pt has a tub bench and standard BSC from her SO   OT Evaluation Precautions/Restrictions  Precautions Precautions: None Pain Pain Assessment Pain Score: 0-No pain Home Living/Prior Functioning Home Living Living Arrangements: Spouse/significant other (Lives with BF) Available Help at Discharge: Family Type of Home: Apartment Home Access: Level entry Home Layout: One level Bathroom Shower/Tub: Chiropodist: Handicapped height Home Equipment: Tub bench, Hand held shower head, [place] (2 wheels), Adaptive equipment  Lives With: Significant other Prior Function Level of Independence: Independent with basic ADLs, Independent with homemaking with ambulation  Able to Take Stairs?: Yes Driving: Yes Vocation: Other (Comment) Vocation Requirements: per chart. 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 Vision Baseline Vision/History: 0 No visual deficits Ability to See in Adequate Light: 0 Adequate Patient Visual Report: No change from baseline Vision Assessment?:  Yes Eye Alignment: Within Functional Limits Ocular Range of Motion: Within Functional  Limits Alignment/Gaze Preference: Within Defined Limits Tracking/Visual Pursuits: Decreased smoothness of eye movement to LEFT superior field;Decreased smoothness of eye movement to LEFT inferior field;Unable to hold eye position out of midline;Other (comment) (unable to hold out of midline on L) Saccades: Within functional limits Visual Fields: Left homonymous hemianopsia Additional Comments: noted to hold head in R rotation, but able to scan full visial field spontaneously Perception  Perception: Impaired Inattention/Neglect: Does not attend to left side of body Praxis Praxis: Intact Cognition Cognition Overall Cognitive Status: Difficult to assess Arousal/Alertness: Awake/alert Orientation Level: Person;Place;Situation (pt handwrote date and where she was and that she had a stroke) Person: Oriented Place: Oriented Situation: Oriented Memory: Appears intact Awareness: Appears intact Problem Solving: Appears intact Safety/Judgment: Appears intact Brief Interview for Mental Status (BIMS) Repetition of Three Words (First Attempt): No answer (unable to verbalize, severely limited vocal sounds) Temporal Orientation: Year: Correct (handwritten) Temporal Orientation: Month: Accurate within 5 days (handwritten) Temporal Orientation: Day: Correct (handwritten) Recall: "Sock": No answer (unable to verbalize, severely limited vocal sounds) Recall: "Blue": No answer Recall: "Bed": No answer BIMS Summary Score: 99 Sensation Sensation Light Touch: Impaired Detail Light Touch Impaired Details: Impaired LLE;Impaired LUE Hot/Cold: Appears Intact (able to tell water temp differences) Proprioception: Impaired Detail Proprioception Impaired Details: Impaired LUE;Impaired LLE Stereognosis: Not tested Additional Comments: able to detect stimuli, reports decreased appreciation to deep pressure and light  touch Coordination Gross Motor Movements are Fluid and Coordinated: No Fine Motor Movements are Fluid and Coordinated: No Coordination and Movement Description: dense L sided hemiplegia Finger Nose Finger Test: unable to perform on the LUE Heel Shin Test: unable to perform on the LLE Motor  Motor Motor: Hemiplegia Motor - Skilled Clinical Observations: dense LUE and LLE hemiplegia  Trunk/Postural Assessment  Postural Control Postural Control: Deficits on evaluation Trunk Control: strong L lean in standing  Balance Balance Balance Assessed: Yes Standardized Balance Assessment Standardized Balance Assessment: FIST Function in Sitting Test = FIST Anterior Nudge: superior sternum: Upper extremity support Posterior Nudge: between scapular spines: Upper extremity support Lateral Nudge: to dominant side at acromion: Needs assistance Lateral Nudge Assistance: Mod Assist Static Sitting: 30 seconds: Upper extremity support Sitting, Shake "No": left and right: Verbal cues/increased time Sitting, Eyes Closed: 30 seconds: Needs assistance Sitting, Eyes Closed Assistance: Min Assist Sitting, Lift Foot: dominant side, lift foot 1 inch twice: Upper extremity support Pick Up Object From Behind: object at midline, hands breadth posterior: Needs assistance Pick Up Object From Behind Assistance: Mod Assist Forward Reach: use dominant arm, must complete full motion: Needs assistance Forward Reach Assistance: Min Assist Lateral Reach: use dominant arm, clear opposite ischial tuberosity: Needs assistance Lateral Reach Assistance: Min Assist Pick Up Object From Floor: from between feet: Needs assistance Pick Up Object From Floor  Assistance: Min Assist Posterior Scooting: move backwards 2 inches: Needs assistance Posterior Scooting Assistance: Max Assist Anterior Scooting: move forward 2 inches: Needs assistance Anterior Scooting Assistance: Max Assist Lateral Scooting: move to dominent side 2  inches: Needs assistance Lateral Scooting Assistance: Max Assist FIST TOTAL SCORE: 20 Static Sitting Balance Static Sitting - Balance Support: Right upper extremity supported Static Sitting - Level of Assistance: 5: Stand by assistance;4: Min assist Dynamic Sitting Balance Dynamic Sitting - Balance Support: Right upper extremity supported Dynamic Sitting - Level of Assistance: 3: Mod assist Static Standing Balance Static Standing - Balance Support: Right upper extremity supported Static Standing - Level of Assistance: 2: Max assist Dynamic Standing Balance Dynamic Standing - Balance Support: Right  upper extremity supported Dynamic Standing - Level of Assistance: 1: +2 Total assist Extremity/Trunk Assessment RUE Assessment RUE Assessment: Within Functional Limits LUE Assessment LUE Body System: Neuro Brunstrum levels for arm and hand: Arm;Hand Brunstrum level for arm: Stage I Presynergy (trace movement in shoulder) Brunstrum level for hand: Stage I Flaccidity  Care Tool Care Tool Self Care Eating    supervision    Oral Care    Oral Care Assist Level: Minimal Assistance - Patient > 75%    Bathing   Body parts bathed by patient: Chest;Abdomen;Right upper leg;Left upper leg;Face;Left arm Body parts bathed by helper: Right arm;Front perineal area;Buttocks;Right lower leg;Left lower leg   Assist Level: Moderate Assistance - Patient 50 - 74%    Upper Body Dressing(including orthotics)   What is the patient wearing?: Bra;Pull over shirt   Assist Level: Moderate Assistance - Patient 50 - 74%    Lower Body Dressing (excluding footwear)   What is the patient wearing?: Underwear/pull up;Pants Assist for lower body dressing: Maximal Assistance - Patient 25 - 49%    Putting on/Taking off footwear   What is the patient wearing?: Non-skid slipper socks Assist for footwear: Dependent - Patient 0%       Care Tool Toileting Toileting activity   Assist for toileting: 2 Helpers      Care Tool Bed Mobility Roll left and right activity        Sit to lying activity        Lying to sitting on side of bed activity         Care Tool Transfers Sit to stand transfer Sit to stand activity did not occur:  (only using stedy - +2)      Chair/bed transfer         Toilet transfer   Assist Level: 2 Helpers (stedy)     Care Tool Cognition  Expression of Ideas and Wants Expression of Ideas and Wants: 1. Rarely/Never expressess or very difficult - rarely/never expresses self or speech is very difficult to understand  Understanding Verbal and Non-Verbal Content Understanding Verbal and Non-Verbal Content: 4. Understands (complex and basic) - clear comprehension without cues or repetitions   Memory/Recall Ability Memory/Recall Ability : Current season;That he or she is in a hospital/hospital unit   Refer to Care Plan for [place] 1 OT Short Term Goal 1 (Week 1): Pt will be able to complete a sq pivot toilet transfer with max A of 1. OT Short Term Goal 2 (Week 1): Pt will be able to rise to stand and hold grab bar with R hand with mod A of 1. OT Short Term Goal 3 (Week 1): Pt will hold static stand with RUE support during LB self care. OT Short Term Goal 4 (Week 1): Pt will don a pullover shirt with min A OT Short Term Goal 5 (Week 1): Pt will demonstrate self management of L arm with positioning with min cues.  Recommendations for other services: None    Skilled Therapeutic Intervention ADL ADL Eating: Supervision/safety Grooming: Minimal assistance Upper Body Bathing: Minimal assistance Lower Body Bathing: Moderate assistance Upper Body Dressing: Moderate assistance Lower Body Dressing: Maximal assistance Toileting: Dependent (+2 A) Toilet Transfer: Dependent (stedy +2)   Pt seen for initial evaluation and ADL training. Explained role of OT and although pt is unable to express herself verbally, she can with writing and has a  good understanding of what OT will be focusing on.  Briefly discussed goals. For LB self care, used stedy lift to assist with pt but then ultimately needed a +2 a pt was leaning to left in standing in stedy.  Pt completed bathing and dressing and began to learn hemidressing techniques.  The main focus on therapy will be on LUE NMR and postural control to increase Ind with self care. Pt resting in wc at end of session with belt alarm on and all needs met.    Discharge Criteria: Patient will be discharged from OT if patient refuses treatment 3 consecutive times without medical reason, if treatment goals not met, if there is a change in medical status, if patient makes no progress towards goals or if patient is discharged from hospital.  The above assessment, treatment plan, treatment alternatives and goals were discussed and mutually agreed upon: by patient  [facility] 08/02/2022, 12:45 PM

## 2022-08-02 NOTE — Progress Notes (Signed)
Inpatient Rehabilitation  Patient information reviewed and entered into eRehab system by Carmin Dibartolo M. Tavita Eastham, M.A., CCC/SLP, PPS Coordinator.  Information including medical coding, functional ability and quality indicators will be reviewed and updated through discharge.    

## 2022-08-02 NOTE — Progress Notes (Signed)
[place] Individual Statement of Services  Patient Name:  Kara Bailey  Date:  08/02/2022  Welcome to the [facility].  Our goal is to provide you with an individualized program based on your diagnosis and situation, designed to meet your specific needs.  With this comprehensive rehabilitation program, you will be expected to participate in at least 3 hours of rehabilitation therapies Monday-Friday, with modified therapy programming on the weekends.  Your rehabilitation program will include the following services:  Physical Therapy (PT), Occupational Therapy (OT), Speech Therapy (ST), 24 hour per day rehabilitation nursing, Therapeutic Recreaction (TR), Neuropsychology, Care Coordinator, Rehabilitation Medicine, Nutrition Services, Pharmacy Services, and Other  Weekly team conferences will be held on Wednesdays to discuss your progress.  Your Inpatient Rehabilitation Care Coordinator will talk with you frequently to get your input and to update you on team discussions.  Team conferences with you and your family in attendance may also be held.  Expected length of stay: 18-21 Days  Overall anticipated outcome:  Min A  Depending on your progress and recovery, your program may change. Your Inpatient Rehabilitation Care Coordinator will coordinate services and will keep you informed of any changes. Your Inpatient Rehabilitation Care Coordinator's name and contact numbers are listed  below.  The following services may also be recommended but are not provided by the [place]:   [place] will be made to provide these services after discharge if needed.  Arrangements include referral to agencies that provide these services.  Your insurance has been verified to be:   [employer]  Your primary doctor is:  Andree Moro, DO  Pertinent information will be  shared with your doctor and your insurance company.  Inpatient Rehabilitation Care Coordinator:  Erlene Quan, [place] or [phone]  Information discussed with and copy given to patient by: Dyanne Iha, 08/02/2022, 11:38 AM

## 2022-08-02 NOTE — Progress Notes (Signed)
Inpatient Rehabilitation Care Coordinator Assessment and Plan Patient Details  Name: Kara Bailey MRN: [id number] Date of Birth: [birth year]  Today's Date: 08/02/2022  Hospital Problems: Principal Problem:   Ischemic stroke [facility])  Past Medical History:  Past Medical History:  Diagnosis Date   Anxiety    Chest pain 08/2014   unspecified   Cirrhosis ([place])    Depression    Dizziness 08/2014   Dyspnea 08/2014   Dysrhythmia    Emphysema lung (HCC)    GERD (gastroesophageal reflux disease)    Heart murmur    Hepatitis C    Hiatal hernia    Hypertension    PAC (premature atrial contraction) 10/11/2014   PAD (peripheral artery disease) (HCC)    Palpitations    PVC (premature ventricular contraction) 10/11/2014   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   Stroke ([place])    just slurred speech   Tachycardia 08/2014   Ventral hernia    Weakness 08/2014   Past Surgical History:  Past Surgical History:  Procedure Laterality Date   ABDOMINAL AORTOGRAM W/LOWER EXTREMITY N/A 07/13/2021   Procedure: ABDOMINAL AORTOGRAM W/LOWER EXTREMITY;  Surgeon: Lorretta Harp, MD;  Location: [place] CV LAB;  Service: Cardiovascular;  Laterality: N/A;   ABDOMINAL AORTOGRAM W/LOWER EXTREMITY N/A 03/19/2022   Procedure: ABDOMINAL AORTOGRAM W/LOWER EXTREMITY;  Surgeon: Lorretta Harp, MD;  Location: [place] CV LAB;  Service: Cardiovascular;  Laterality: N/A;   APPENDECTOMY     CATARACT EXTRACTION, BILATERAL     COLONOSCOPY     COLONOSCOPY WITH PROPOFOL N/A 04/30/2016   Procedure: COLONOSCOPY WITH PROPOFOL;  Surgeon: Daneil Dolin, MD;  [facility];  Service: Endoscopy;  Laterality: N/A;  [place] N/A 10/13/2021   Procedure: OPEN REPAIR EPIGASTRIC HERNIA WITH MESH PATCH;  Surgeon: Armandina Gemma, MD;  Location: [facility];  Service: General;  Laterality: N/A;   ESOPHAGOGASTRODUODENOSCOPY     approximately 2010   ESOPHAGOGASTRODUODENOSCOPY (EGD) WITH  PROPOFOL N/A 04/30/2016   Procedure: ESOPHAGOGASTRODUODENOSCOPY (EGD) WITH PROPOFOL;  Surgeon: Daneil Dolin, MD;  [facility];  Service: Endoscopy;  Laterality: N/A;   LEFT HEART CATHETERIZATION WITH CORONARY ANGIOGRAM N/A 10/29/2014   07-14-20- pt denies this Procedure: LEFT HEART CATHETERIZATION WITH CORONARY ANGIOGRAM;  Surgeon: Burnell Blanks, MD;  Location: [facility] CATH LAB;  Service: Cardiovascular;  Laterality: N/A;   MALONEY DILATION N/A 04/30/2016   Procedure: Venia Minks DILATION;  Surgeon: Daneil Dolin, MD;  [facility];  Service: Endoscopy;  Laterality: N/A;   PERIPHERAL VASCULAR INTERVENTION  07/13/2021   Procedure: PERIPHERAL VASCULAR INTERVENTION;  Surgeon: Lorretta Harp, MD;  Location: [place] CV LAB;  Service: Cardiovascular;;  left SFA left external iliac   PERIPHERAL VASCULAR INTERVENTION Right 03/19/2022   Procedure: PERIPHERAL VASCULAR INTERVENTION;  Surgeon: Lorretta Harp, MD;  Location: [place] CV LAB;  Service: Cardiovascular;  Laterality: Right;  SFA   POLYPECTOMY  04/30/2016   Procedure: POLYPECTOMY;  Surgeon: Daneil Dolin, MD;  [facility];  Service: Endoscopy;;  Sigmoid colon polyp removed via hot snare   UPPER GASTROINTESTINAL ENDOSCOPY     Social History:  reports that she has been smoking cigarettes. She started smoking about 53 years ago. She has a 20.00 pack-year smoking history. She has never used smokeless tobacco. She reports current alcohol use. She reports that she does not use drugs.  Family / Support Systems Spouse/Significant Other: Ewell Poe Children: Ricky (Son), [name] (Daughter)  Other Supports: Barnetta Chapel (Sister) Anticipated Caregiver: Wylene Men Ability/Limitations of Caregiver: none Caregiver Availability: 24/7 Family Dynamics: support from S.O, children and sister  Social History Preferred language: English Religion: [employer] - How often do you need to have someone  help you when you read instructions, pamphlets, or other written material from your doctor or pharmacy?: Never Writes: Yes   Abuse/Neglect Abuse/Neglect Assessment Can Be Completed: Yes Physical Abuse: Denies Verbal Abuse: Denies Sexual Abuse: Denies Exploitation of patient/patient's resources: Denies Self-Neglect: Denies  Patient response to: Social Isolation - How often do you feel lonely or isolated from those around you?: Never  Emotional Status Recent Psychosocial Issues: coping, anxiety and depression Psychiatric History: hx of anxiety and depression  Patient / Family Perceptions, Expectations & Goals Pt/Family understanding of illness & functional limitations: yes Premorbid pt/family roles/activities: Previously independent overall Anticipated changes in roles/activities/participation: S.O able to assist with some roles and tasks 24/7 supervision avaliable Pt/family expectations/goals: [employer]: None Premorbid Home Care/DME Agencies: Other (Comment) ([employer], [employer]) Is the patient able to respond to transportation needs?: Yes In the past 12 months, has lack of transportation kept you from medical appointments or from getting medications?: No In the past 12 months, has lack of transportation kept you from meetings, work, or from getting things needed for daily living?: No  Discharge Planning Living Arrangements: Spouse/significant other (Lives with BF) Support Systems: Children, Spouse/significant other Type of Residence: Private residence Insurance Resources: Multimedia programmer (specify) ([employer] Medicare) Financial Resources: Family Support Financial Screen Referred: No Living Expenses: Lives with family Does the patient have any problems obtaining your medications?: No Home Management: Independent Patient/Family Preliminary Plans: S.O able to assis with cogntive tasks Care Coordinator Barriers to Discharge: Insurance for SNF  coverage, Decreased caregiver support, Lack of/limited family support Care Coordinator Anticipated Follow Up Needs: HH/OP Expected length of stay: 18-21 Days  Clinical Impression Sw met with patient and introduced self and explained role. Patient wrote down and shared "I just got here last light" SW shared that this was correct. SW left contact information for patient S.O. who with assist patient at home primarily.   Dyanne Iha 08/02/2022, 12:06 PM

## 2022-08-02 NOTE — Plan of Care (Signed)
  Problem: RH Balance Goal: LTG Patient will maintain dynamic sitting balance (PT) Description: LTG:  Patient will maintain dynamic sitting balance with assistance during mobility activities (PT) Flowsheets (Taken 08/02/2022 1132) LTG: Pt will maintain dynamic sitting balance during mobility activities with:: Independent with assistive device  Goal: LTG Patient will maintain dynamic standing balance (PT) Description: LTG:  Patient will maintain dynamic standing balance with assistance during mobility activities (PT) Flowsheets (Taken 08/02/2022 1132) LTG: Pt will maintain dynamic standing balance during mobility activities with:: Moderate Assistance - Patient 50 - 74%   Problem: Sit to Stand Goal: LTG:  Patient will perform sit to stand with assistance level (PT) Description: LTG:  Patient will perform sit to stand with assistance level (PT) Flowsheets (Taken 08/02/2022 1132) LTG: PT will perform sit to stand in preparation for functional mobility with assistance level: Minimal Assistance - Patient > 75%   Problem: RH Bed Mobility Goal: LTG Patient will perform bed mobility with assist (PT) Description: LTG: Patient will perform bed mobility with assistance, with/without cues (PT). Flowsheets (Taken 08/02/2022 1132) LTG: Pt will perform bed mobility with assistance level of: Minimal Assistance - Patient > 75%   Problem: RH Bed to Chair Transfers Goal: LTG Patient will perform bed/chair transfers w/assist (PT) Description: LTG: Patient will perform bed to chair transfers with assistance (PT). Flowsheets (Taken 08/02/2022 1132) LTG: Pt will perform Bed to Chair Transfers with assistance level: Minimal Assistance - Patient > 75%   Problem: RH Car Transfers Goal: LTG Patient will perform car transfers with assist (PT) Description: LTG: Patient will perform car transfers with assistance (PT). Flowsheets (Taken 08/02/2022 1132) LTG: Pt will perform car transfers with assist:: Moderate Assistance  - Patient 50 - 74%   Problem: RH Ambulation Goal: LTG Patient will ambulate in controlled environment (PT) Description: LTG: Patient will ambulate in a controlled environment, # of feet with assistance (PT). Flowsheets (Taken 08/02/2022 1132) LTG: Pt will ambulate in controlled environ  assist needed:: Maximal Assistance - Patient 25 - 49% LTG: Ambulation distance in controlled environment: 20f with PT only for forced use of LLE   Problem: RH Wheelchair Mobility Goal: LTG Patient will propel w/c in controlled environment (PT) Description: LTG: Patient will propel wheelchair in controlled environment, # of feet with assist (PT) Flowsheets (Taken 08/02/2022 1132) LTG: Pt will propel w/c in controlled environ  assist needed:: Supervision/Verbal cueing LTG: Propel w/c distance in controlled environment: [id number] LRAD Goal: LTG Patient will propel w/c in home environment (PT) Description: LTG: Patient will propel wheelchair in home environment, # of feet with assistance (PT). Flowsheets (Taken 08/02/2022 1132) Distance: wheelchair distance in controlled environment: 50

## 2022-08-02 NOTE — Progress Notes (Signed)
Lower extremity venous has been completed.   Preliminary results in CV Proc.   Jinny Blossom Kamri Gotsch 08/02/2022 3:21 PM

## 2022-08-02 NOTE — Discharge Summary (Signed)
Physician Discharge Summary  Patient ID: Kara Bailey MRN: [id number] DOB/AGE: 68-Aug-1957 68 y.o.  Admit date: 08/01/2022 Discharge date: 09/06/2022  Discharge Diagnoses:  Principal Problem:   [facility]) Active problems: Functional deficits secondary to prior stroke and ischemic small vessel disease Dysphagia Left hemiparesis Hypertension Hyperlipidemia Tobacco use Constipation Gastroesophageal reflux disease  Discharged Condition: good  Significant Diagnostic Studies: VAS [place] LOWER EXTREMITY VENOUS (DVT)  Result Date: 08/02/2022  Lower Venous DVT Study Patient Name:  Kara Bailey  Date of Exam:   08/02/2022 Medical Rec #: [id number]         Accession #:    [id number] Date of Birth: Jan 09, 1956          Patient Gender: F Patient Age:   66 years Exam Location:  [facility] Procedure:      VAS [place] LOWER EXTREMITY VENOUS (DVT) Referring Phys: Kara Bailey --------------------------------------------------------------------------------  Indications: Edema.  Comparison Study: no prior Performing Technologist: Kara Bailey RVS  Examination Guidelines: A complete evaluation includes B-mode imaging, spectral Doppler, color Doppler, and power Doppler as needed of all accessible portions of each vessel. Bilateral testing is considered an integral part of a complete examination. Limited examinations for reoccurring indications may be performed as noted. The reflux portion of the exam is performed with the patient in reverse Trendelenburg.  +-----+---------------+---------+-----------+----------+--------------+ RIGHTCompressibilityPhasicitySpontaneityPropertiesThrombus Aging +-----+---------------+---------+-----------+----------+--------------+ CFV  Full           Yes      Yes                                 +-----+---------------+---------+-----------+----------+--------------+   +--------+---------------+---------+-----------+----------+--------------------+ LEFT     CompressibilityPhasicitySpontaneityPropertiesThrombus Aging       +--------+---------------+---------+-----------+----------+--------------------+ CFV     Full           Yes      Yes                                       +--------+---------------+---------+-----------+----------+--------------------+ SFJ     Full                                                              +--------+---------------+---------+-----------+----------+--------------------+ FV Prox Full                                                              +--------+---------------+---------+-----------+----------+--------------------+ FV Mid                 Yes      Yes                  patent by color  doppler              +--------+---------------+---------+-----------+----------+--------------------+ FV                     Yes      Yes                  patent by color      Distal                                               doppler              +--------+---------------+---------+-----------+----------+--------------------+ PFV     Full                                                              +--------+---------------+---------+-----------+----------+--------------------+ POP     Full           Yes      Yes                                       +--------+---------------+---------+-----------+----------+--------------------+ PTV     Full                                                              +--------+---------------+---------+-----------+----------+--------------------+ PERO    Full                                                              +--------+---------------+---------+-----------+----------+--------------------+    Summary: RIGHT: - No evidence of common femoral vein obstruction.  LEFT: - There is no evidence of deep vein thrombosis in the lower extremity.  - No cystic structure  found in the popliteal fossa.  *See table(s) above for measurements and observations.    Preliminary    Labs:  Basic Metabolic Panel: Recent Labs  Lab 09/03/22 0518  NA 141  K 4.2  CL 110  CO2 25  GLUCOSE 91  BUN 12  CREATININE 1.00  CALCIUM 9.6    CBC: Recent Labs  Lab 09/03/22 0518  WBC 7.5  HGB 11.4*  HCT 36.3  MCV 94.0  PLT 281    Brief HPI:   Kara Bailey is a 66 y.o. female with history of prior left thalamic stroke on aspirin and Plavix who presents with worsened left facial droop, dysarthria and left-sided weakness with decreased sensation on 07/26/2022.  Family reports had mild left sided weakness and mild speech changes at baseline due to prior CVA. Neurology was consulted and MRI was significant for patchy acute on chronic small vessel ischemia of the right corona radiata and posterior right lentiform.  Etiology likely chronic small vessel disease.  CT head negative for acute CVA, CTA head/neck negative for LVO.   Hospital Course: Kara Bailey was admitted to rehab 08/01/2022 for inpatient therapies to consist of PT, ST and OT at least three hours five days a week. Past admission physiatrist, therapy team and rehab RN have worked together to provide customized collaborative inpatient rehab. LLE venous duplex negative for DVT. Dislike of pureed food but explained importance of this. Follow-up labs excellent. Developed cough, sneezing, malaise on 9/29 and respiratory viral panel negative. Advanced to Dysphagia 2 with thin liquids on 10/4. Melatonin started on 10/7.  Started Remeron 30 mg at bedtime on 10/16 for sleep. Discussed discharge to home limitations with son. Nursing home placement sought. Decided to discharge to home of her son.  Blood pressures were monitored on TID basis and continued on Cardizem 180 mg daily. This was increased to 240 mg on 10/13.   Rehab course: During patient's stay in rehab weekly team conferences were held to monitor patient's  progress, set goals and discuss barriers to discharge. At admission, patient required max assist for mobility and min to mod assist for ADLs.  She has had improvement in activity tolerance, balance, postural control as well as ability to compensate for deficits. She has had improvement in functional use RUE/LUE  and RLE/LLE as well as improvement in awareness   Discharge disposition: 01-Home or Self Care      Diet: dysphagia 2 diet/thin liquids  Special Instructions: No driving, alcohol consumption or tobacco use.   30-35 minutes were spent on discharge planning and discharge summary.   Discharge Instructions     Ambulatory referral to Neurology   Complete by: As directed    An appointment is requested in approximately: 4 weeks   Ambulatory referral to Physical Medicine Rehab   Complete by: As directed    Hospital follow-up   Discharge patient   Complete by: As directed    Discharge disposition: 01-Home or Self Care   Discharge patient date: 09/06/2022      Allergies as of 09/06/2022   No Known Allergies      Medication List     STOP taking these medications    albuterol 108 (90 Base) MCG/ACT inhaler Commonly known as: VENTOLIN HFA   hydrOXYzine 50 MG tablet Commonly known as: ATARAX   pantoprazole 40 MG tablet Commonly known as: PROTONIX       TAKE these medications    acetaminophen 325 MG tablet Commonly known as: TYLENOL Take 1-2 tablets (325-650 mg total) by mouth every 4 (four) hours as needed for mild pain.   ALPRAZolam 0.5 MG tablet Commonly known as: XANAX Take 1 tablet (0.5 mg total) by mouth daily as needed for anxiety.   aspirin EC 81 MG tablet Take 1 tablet (81 mg total) by mouth daily. Swallow whole.   atorvastatin 80 MG tablet Commonly known as: LIPITOR Take 1 tablet (80 mg total) by mouth daily. What changed: when to take this   buPROPion 150 MG 12 hr tablet Commonly known as: ZYBAN Take 150 mg by mouth daily as needed (to stop  smoking).   clopidogrel 75 MG tablet Commonly known as: PLAVIX TAKE 1 TABLET EVERY DAY   diltiazem 240 MG 24 hr capsule Commonly known as: CARDIZEM CD Take 1 capsule (240 mg total) by mouth daily. Start taking on: September 07, 2022 What changed:  medication strength how much to take   ezetimibe 10 MG tablet Commonly known as: ZETIA Take 10 mg by mouth  daily.   famotidine 40 MG tablet Commonly known as: PEPCID Take 40 mg by mouth at bedtime.   melatonin 3 MG Tabs tablet Take 1 tablet (3 mg total) by mouth at bedtime.   mirtazapine 30 MG tablet Commonly known as: REMERON Take 0.5 tablets (15 mg total) by mouth at bedtime. What changed: how much to take   multivitamin with minerals Tabs tablet Take 1 tablet by mouth daily.   nicotine 14 mg/24hr patch Commonly known as: NICODERM CQ - dosed in mg/24 hours Place 14 mg onto the skin daily as needed (nicotine dependence).   polyethylene glycol 17 g packet Commonly known as: MIRALAX / GLYCOLAX Take 17 g by mouth daily as needed.   senna-docusate 8.6-50 MG tablet Commonly known as: Senokot-S Take 1 tablet by mouth 2 (two) times daily.   tiZANidine 2 MG tablet Commonly known as: ZANAFLEX Take 1 tablet (2 mg total) by mouth at bedtime.   traMADol 50 MG tablet Commonly known as: ULTRAM Take 1 tablet (50 mg total) by mouth every 6 (six) hours as needed for moderate pain.        Signed: Barbie Banner 09/06/2022, 10:49 AM

## 2022-08-02 NOTE — Evaluation (Addendum)
Speech Language Pathology Assessment and Plan  Patient Details  Name: Kara Bailey MRN: [id number] Date of Birth: 04-06-1956  SLP Diagnosis: Dysarthria;Voice disorder;Dysphagia;Speech and Language deficits  Rehab Potential: Good ELOS:   3-4 weeks  Today's Date: 08/02/2022 SLP Individual Time: 1350-1450 SLP Individual Time Calculation (min): 82 min  Hospital Problem: Principal Problem:   Ischemic stroke [facility])  Past Medical History:  Past Medical History:  Diagnosis Date   Anxiety    Chest pain 08/2014   unspecified   Cirrhosis ([place])    Depression    Dizziness 08/2014   Dyspnea 08/2014   Dysrhythmia    Emphysema lung (HCC)    GERD (gastroesophageal reflux disease)    Heart murmur    Hepatitis C    Hiatal hernia    Hypertension    PAC (premature atrial contraction) 10/11/2014   PAD (peripheral artery disease) (HCC)    Palpitations    PVC (premature ventricular contraction) 10/11/2014   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   Stroke ([place])    just slurred speech   Tachycardia 08/2014   Ventral hernia    Weakness 08/2014   Past Surgical History:  Past Surgical History:  Procedure Laterality Date   ABDOMINAL AORTOGRAM W/LOWER EXTREMITY N/A 07/13/2021   Procedure: ABDOMINAL AORTOGRAM W/LOWER EXTREMITY;  Surgeon: Lorretta Harp, MD;  Location: [place] [facility];  Service: Cardiovascular;  Laterality: N/A;   ABDOMINAL AORTOGRAM W/LOWER EXTREMITY N/A 03/19/2022   Procedure: ABDOMINAL AORTOGRAM W/LOWER EXTREMITY;  Surgeon: Lorretta Harp, MD;  Location: [place] [facility];  Service: Cardiovascular;  Laterality: N/A;   APPENDECTOMY     CATARACT EXTRACTION, BILATERAL     COLONOSCOPY     COLONOSCOPY WITH PROPOFOL N/A 04/30/2016   Procedure: COLONOSCOPY WITH PROPOFOL;  Surgeon: Daneil Dolin, MD;  [facility];  Service: Endoscopy;  Laterality: N/A;  [place] N/A 10/13/2021   Procedure: OPEN REPAIR EPIGASTRIC HERNIA WITH MESH  PATCH;  Surgeon: Armandina Gemma, MD;  Location: [facility];  Service: General;  Laterality: N/A;   ESOPHAGOGASTRODUODENOSCOPY     approximately 2010   ESOPHAGOGASTRODUODENOSCOPY (EGD) WITH PROPOFOL N/A 04/30/2016   Procedure: ESOPHAGOGASTRODUODENOSCOPY (EGD) WITH PROPOFOL;  Surgeon: Daneil Dolin, MD;  [facility];  Service: Endoscopy;  Laterality: N/A;   LEFT HEART CATHETERIZATION WITH CORONARY ANGIOGRAM N/A 10/29/2014   07-14-20- pt denies this Procedure: LEFT HEART CATHETERIZATION WITH CORONARY ANGIOGRAM;  Surgeon: Burnell Blanks, MD;  Location: [facility];  Service: Cardiovascular;  Laterality: N/A;   MALONEY DILATION N/A 04/30/2016   Procedure: Venia Minks DILATION;  Surgeon: Daneil Dolin, MD;  [facility];  Service: Endoscopy;  Laterality: N/A;   PERIPHERAL VASCULAR INTERVENTION  07/13/2021   Procedure: PERIPHERAL VASCULAR INTERVENTION;  Surgeon: Lorretta Harp, MD;  Location: [place] [facility];  Service: Cardiovascular;;  left SFA left external iliac   PERIPHERAL VASCULAR INTERVENTION Right 03/19/2022   Procedure: PERIPHERAL VASCULAR INTERVENTION;  Surgeon: Lorretta Harp, MD;  Location: [place] [facility];  Service: Cardiovascular;  Laterality: Right;  SFA   POLYPECTOMY  04/30/2016   Procedure: POLYPECTOMY;  Surgeon: Daneil Dolin, MD;  [facility];  Service: Endoscopy;;  Sigmoid colon polyp removed via hot snare   UPPER GASTROINTESTINAL ENDOSCOPY      Assessment / Plan / Recommendation Clinical Impression Kara Bailey is a 66 year old female with history of prior left thalamic stroke on aspirin and Plavix who presents  with worsened left facial droop, dysarthria and left-sided weakness with decreased sensation on 07/26/2022.  Family reports had mild left sided weakness and mild speech changes at baseline due to prior CVA. Neurology was consulted and MRI was significant for patchy acute on chronic small vessel ischemia of the right corona radiata and  posterior right lentiform.  Etiology likely chronic small vessel disease. She is currently tolerating dysphagia 1 diet with thin liquids. The patient requires inpatient physical medicine and rehabilitation evaluations and treatment secondary to dysfunction due to small vessel ischemia of right corona radiata and posterior right lentiform.  Pt presents with severe-to-profound motor speech deficits as evidenced by a significant dysarthria with possible motor planning component. Pt also presents with severe dysphonia as evidenced by impaired breath support resulting in low vocal intensity and aphonia vs. hoarseness resulting in minimal voicing capability. This combination of speech and voice deficits substantially limit pt's verbal communication. Pt exhibited no attempts to vocalize during session unless instructed to do so. Pt sustained "ah" for ~1 second duration. She approximated production of single syllable words with <25% intelligibility. Pt compensates well with self initiated gestures and written communication at the word/phrase/sentence level as indicated with excellent legibility. Expressive/receptive language appear grossly intact. Pt responded to basic and complex yes/no questions accurately and follow 1-2 step commands without difficulty. Pt was oriented x4, demonstrated excellent attention to task, awareness of deficits, and recall from previous events and conversations from earlier. Unable to formally assess cognition, however based on performance today suspect cognition is grossly intact. Provided pt with pen + notebook, and dry erase board + marker to further support written communication efforts.   Per pt and OT, pt exhibited coughing with liquid medication given this morning. Pt denied other coughing incidents with breakfast; pt did not consume lunch due to being "tired of eating puree" per pt's written communication.   Pt participated in clinical bedside swallow evaluation suggestive of severe  oral dysphagia and minimal pharyngeal component. This was further supported by MBS recently obtained on 07/27/22. Pt consumed pureed textures with impaired labial seal, and decreased lingual strength, coordination, and control resulting in generalized anterior loss of bolus, and moderate oral residuals post swallows. Pt appeared unaware of loss, but made efforts to occasionally wipe mouth. Pt consumed dysphagia 2 textures with significantly prolonged and ineffective oral preparation resulting again in anterior loss of bolus, and incomplete mastication resulting in need to orally expel bolus from oral cavity. Pt consumed single sips of thin liquid by cup with decent control and minimal anterior loss. There were no overt s/sx of aspiration among all tested consistencies. SLP recommends continuation of dysphagia 1 textures due to severity of oral deficits and limited tolerance of advanced textures this date. Recommend crushed medications and full supervision with PO consumption. Allow liquids at bedside ONLY if sitting upright. Communicated recommendations to pt; pt demonstrated understanding/agreement with thumbs up.   SLP recommends skilled ST intervention to address speech/voice/swallowing deficits to maximize functional independence.    Skilled Therapeutic Interventions          Pt participated in speech-language-cognitive-swallow evaluations. Please see above.  SLP Assessment  Patient will need skilled Speech Lanaguage Pathology Services during CIR admission    Recommendations  SLP Diet Recommendations: Thin;Dysphagia 1 (Puree) Liquid Administration via: Cup;No straw Medication Administration: Crushed with puree Supervision: Patient able to self feed;Full supervision/cueing for compensatory strategies Compensations: Slow rate;Small sips/bites;Lingual sweep for clearance of pocketing;Monitor for anterior loss;Clear throat intermittently Postural Changes and/or Swallow Maneuvers: Seated  upright 90  degrees Oral Care Recommendations: Oral care BID Recommendations for Other Services: Neuropsych consult (coping with loss of function, very emotional and sad regarding CVA) Patient destination: Home Follow up Recommendations: 24 hour supervision/assistance;Outpatient SLP;Home Health SLP Equipment Recommended: To be determined    SLP Frequency 3 to 5 out of 7 days   SLP Duration  SLP Intensity  SLP Treatment/Interventions    Minumum of 1-2 x/day, 30 to 90 minutes  Dysphagia/aspiration precaution training;Speech/Language facilitation;Oral motor exercises;Therapeutic Activities;Functional tasks;Patient/family education;Multimodal communication approach    Pain Pain Assessment Pain Scale: 0-10 Pain Score: 5  Pain Type: Acute pain Pain Location: Head Pain Intervention(s): Medication (See eMAR)  Prior Functioning Cognitive/Linguistic Baseline: Within functional limits (pt reported some speech changes 3-4 weeks prior to the stroke) Type of Home: Apartment  Lives With: Significant other Available Help at Discharge:  (pt reported she will have support from daughter and son, however they work and would be unable to provide 24 hour supervision) Education: 12th grade Vocation: Other (Comment) (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.)  SLP Evaluation Cognition Overall Cognitive Status: Difficult to assess (difficult to assess due to severe speech deficits but appear grossly intact) Arousal/Alertness: Awake/alert Orientation Level: Oriented X4 Year: 2023 Month: September Day of Week: Correct Attention: Focused;Selective;Sustained Focused Attention: Appears intact Sustained Attention: Appears intact Selective Attention: Appears intact Memory: Appears intact Awareness: Appears intact Problem Solving:  (minimal assessment to determine) Safety/Judgment: Appears intact  Comprehension Auditory Comprehension Overall Auditory Comprehension: Appears within functional limits  for tasks assessed Yes/No Questions: Within Functional Limits One Step Basic Commands: 75-100% accurate Visual Recognition/Discrimination Discrimination: Not tested Reading Comprehension Reading Status:  (pt able to read her own handwriting; did not assess further d/t time contraints) Expression Expression Primary Mode of Expression: Nonverbal - written (nonverbal- written + gestures) Verbal Expression Overall Verbal Expression: Impaired Initiation: No impairment Automatic Speech:  (unable to verbalize due to severe dysarthria and possible motor planning deficits) Level of Generative/Spontaneous Verbalization:  (vocalization) Naming: No impairment Pragmatics: No impairment Interfering Components: Speech intelligibility Non-Verbal Means of Communication: Writing;Gestures Written Expression Dominant Hand: Right Written Expression: Within Functional Limits Oral Motor Oral Motor/Sensory Function Overall Oral Motor/Sensory Function: Severe impairment Facial ROM: Reduced left;Suspected CN VII (facial) dysfunction Facial Symmetry: Abnormal symmetry left;Suspected CN VII (facial) dysfunction Facial Strength: Reduced left;Suspected CN VII (facial) dysfunction Facial Sensation: Reduced left;Suspected CN V (Trigeminal) dysfunction Lingual ROM: Suspected CN XII (hypoglossal) dysfunction Lingual Symmetry: Within Functional Limits Lingual Strength: Reduced;Suspected CN XII (hypoglossal) dysfunction Lingual Sensation: Reduced;Suspected CN VII (facial) dysfunction-anterior 2/3 tongue Velum: Other (comment) (unable to view) Mandible: Impaired (reduced opening) Motor Speech Overall Motor Speech: Impaired Respiration: Impaired Level of Impairment: Word Phonation: Low vocal intensity;Breathy Articulation: Impaired Level of Impairment: Word Intelligibility: Intelligibility reduced Word: 0-24% accurate Phrase: 0-24% accurate Sentence: Not tested Conversation: Not tested Motor Planning:   (difficult to assess) Effective Techniques:  (seperating words by syllable)  Care Tool Care Tool Cognition Ability to hear (with hearing aid or hearing appliances if normally used Ability to hear (with hearing aid or hearing appliances if normally used): 0. Adequate - no difficulty in normal conservation, social interaction, listening to TV   Expression of Ideas and Wants Expression of Ideas and Wants: 1. Rarely/Never expressess or very difficult - rarely/never expresses self or speech is very difficult to understand (able to compensate for lack of verbal communication with written communication and gestures)   Understanding Verbal and Non-Verbal Content Understanding Verbal and Non-Verbal Content: 3. Usually  understands - understands most conversations, but misses some part/intent of message. Requires cues at times to understand  Memory/Recall Ability Memory/Recall Ability : Current season;That he or she is in a hospital/hospital unit   PMSV Assessment  PMSV Trial Intelligibility: Intelligibility reduced Word: 0-24% accurate Phrase: 0-24% accurate Sentence: Not tested Conversation: Not tested  Bedside Swallowing Assessment General Date of Onset: 07/27/22 Previous Swallow Assessment: MBS 07/27/22 Diet Prior to this Study: Dysphagia 1 (puree);Thin liquids Temperature Spikes Noted: No Respiratory Status: Room air History of Recent Intubation: No Behavior/Cognition: Alert;Cooperative;Pleasant mood Oral Cavity - Dentition: Edentulous (pt owns dentures however states she rarely wears them - "only if we go somewhere special") Self-Feeding Abilities: Needs assist;Needs set up;Able to feed self Vision: Functional for self-feeding Patient Positioning: Upright in chair/Tumbleform Baseline Vocal Quality: Low vocal intensity;Breathy Volitional Cough: Weak Volitional Swallow: Unable to elicit  Oral Care Assessment Oral Assessment  (WDL): Exceptions to WDL Lips: Asymmetrical;Pink Teeth:  Missing (Comment);Other (Comment) (edentulous) Tongue: Pink;Moist Mucous Membrane(s): Pink;Moist Saliva: Moist, saliva free flowing Level of Consciousness: Alert Is patient on any of following O2 devices?: None of the above Nutritional status: Dysphagia Oral Assessment Risk : High Risk Ice Chips Ice chips: Not tested Thin Liquid Thin Liquid: Impaired Presentation: Cup Oral Phase Impairments: Reduced labial seal;Reduced lingual movement/coordination Oral Phase Functional Implications: Left anterior spillage;Prolonged oral transit Pharyngeal  Phase Impairments: Suspected delayed Swallow;Multiple swallows Nectar Thick Nectar Thick Liquid: Not tested Honey Thick Honey Thick Liquid: Not tested Puree Puree: Impaired Presentation: Spoon;Self Fed Oral Phase Impairments: Reduced labial seal;Reduced lingual movement/coordination;Poor awareness of bolus Oral Phase Functional Implications: Prolonged oral transit;Oral residue;Left lateral sulci pocketing;Left anterior spillage Pharyngeal Phase Impairments: Suspected delayed Swallow Solid Solid: Impaired Presentation: Self Fed Oral Phase Impairments: Reduced lingual movement/coordination;Reduced labial seal;Impaired mastication;Poor awareness of bolus Oral Phase Functional Implications: Impaired mastication;Prolonged oral transit;Left lateral sulci pocketing;Oral residue Pharyngeal Phase Impairments: Suspected delayed Swallow;Other (comments) (orally expelled unmasticated bolus) BSE Assessment Risk for Aspiration Impact on safety and function: Mild aspiration risk;Risk for inadequate nutrition/hydration Other Related Risk Factors: History of dysphagia;History of esophageal-related issues;Previous CVA  Short Term Goals: Week 1: SLP Short Term Goal 1 (Week 1): Patient will communicate functional wants/needs/wishes/feelings through multimodal communication and min A verbal/visual cues for effectiveness SLP Short Term Goal 2 (Week 1): Patient  will approximate production of single syllable words with max A verbal/visual cues to achieve 50% intelligibility SLP Short Term Goal 3 (Week 1): Patient will sustain vowel sound for up 5 seconds during 3 of 5 occasions with max A cues SLP Short Term Goal 4 (Week 1): Patient will utilize low tech communcation supports Passenger transport manager) and/or written communication to convey message with min A cues for effectiveness SLP Short Term Goal 5 (Week 1): Pt will consume dysphagia 1 textures with thin liquids with improved awareness of bolus/spillage, oral residuals, and without overt s/sx of aspiration with mod A to implement swallowing precautions and strategies  Refer to Care Plan for Long Term Goals  Recommendations for other services: Neuropsych  Discharge Criteria: Patient will be discharged from SLP if patient refuses treatment 3 consecutive times without medical reason, if treatment goals not met, if there is a change in medical status, if patient makes no progress towards goals or if patient is discharged from hospital.  The above assessment, treatment plan, treatment alternatives and goals were discussed and mutually agreed upon: by patient  Patty Sermons 08/02/2022, 5:11 PM

## 2022-08-02 NOTE — Progress Notes (Signed)
PROGRESS NOTE   Subjective/Complaints:  No issues overnite, aphasic from prior CVA , per PT communicates by writing, seen in PT gym  Pt is R handed   ROS-limited by aphasia   Objective:   No results found. Recent Labs    08/02/22 0715  WBC 11.2*  HGB 14.2  HCT 43.6  PLT 268   Recent Labs    07/31/22 1027 08/02/22 0715  NA 143 142  K 4.1 4.0  CL 110 110  CO2 23 24  GLUCOSE 93 108*  BUN 10 19  CREATININE 0.90 0.99  CALCIUM 9.8 9.7    Intake/Output Summary (Last 24 hours) at 08/02/2022 0904 Last data filed at 08/02/2022 0857 Gross per 24 hour  Intake 60 ml  Output --  Net 60 ml        Physical Exam: Vital Signs Blood pressure (!) 149/76, pulse 96, temperature 98.4 F (36.9 C), temperature source Oral, resp. rate 16, weight 59 kg, SpO2 97 %.   General: No acute distress Mood and affect are appropriate Heart: Regular rate and rhythm no rubs murmurs or extra sounds Lungs: Clear to auscultation, breathing unlabored, no rales or wheezes Abdomen: Positive bowel sounds, soft nontender to palpation, nondistended Extremities: No clubbing, cyanosis, or edema Skin: No evidence of breakdown, no evidence of rash Neurologic: Cranial nerves II through XII intact, motor strength is 5/5 in right , 0/5 left deltoid, bicep, tricep, grip, hip flexor, knee extensors, ankle dorsiflexor and plantar flexor Sensory exam limited by aphasia  Musculoskeletal: Full range of motion in all 4 extremities. No joint swelling   Assessment/Plan: 1. Functional deficits which require 3+ hours per day of interdisciplinary therapy in a comprehensive inpatient rehab setting. Physiatrist is providing close team supervision and 24 hour management of active medical problems listed below. Physiatrist and rehab team continue to assess barriers to discharge/monitor patient progress toward functional and medical goals  Care Tool:  Bathing               Bathing assist       Upper Body Dressing/Undressing Upper body dressing        Upper body assist      Lower Body Dressing/Undressing Lower body dressing            Lower body assist       Toileting Toileting    Toileting assist       Transfers Chair/bed transfer  Transfers assist           Locomotion Ambulation   Ambulation assist              Walk 10 feet activity   Assist           Walk 50 feet activity   Assist           Walk 150 feet activity   Assist           Walk 10 feet on uneven surface  activity   Assist           Wheelchair     Assist               Wheelchair 50 feet with 2  turns activity    Assist            Wheelchair 150 feet activity     Assist          Blood pressure (!) 149/76, pulse 96, temperature 98.4 F (36.9 C), temperature source Oral, resp. rate 16, weight 59 kg, SpO2 97 %.  Medical Problem List and Plan: 1. Functional deficits secondary to ischemic small vessel disease of the right corona radiata, tracking toward the posterior right lentiform with left hemiparesis and dysphagia             -patient may shower             -ELOS/Goals: 18-21 days Min A with PT/OT/SLP             -Admit to CIR PT, OT, SLP  2.  Antithrombotics: -DVT/anticoagulation:  Pharmaceutical: Lovenox             -antiplatelet therapy: continue Plavix and aspirin as PTA 3. Pain Management: tylenol as needed 4. Mood/Behavior/Sleep: LCSW to evaluate and provide emotional support             -antipsychotic agents: n/a             -bupropion ER 150 mg q AM             -mirtazapine 30 mg q HS             -alprazolam 0.5 mg daily prn anxiety 5. Neuropsych/cognition: This patient is capable of making decisions on her own behalf. 6. Skin/Wound Care: routine skin care checks 7. Fluids/Electrolytes/Nutrition: routine Is and Os and follow-up chemistries             Dysphagia 1  diet/thin-SLP eval 8: Left-sided weakness/decreased sensation: check LLE venous duplex 9: Hypertension: continue to monitor; BP mildly elevated but stable overall, avoid hypotension             -continue Cardizem 180 mg daily 10: Hyperlipidemia: LDL = 125, counsel regarding healthy diet             -continue  ezetimibe 10 mg daily             -continue atorvastatin 90 mg daily 11: Tobacco use: cessation counseling             -continue nicotine patch 21 mg 12. Constipation             -Order sorbitol 13. GERD             -Continue PPI    LOS: 1 days A FACE TO FACE EVALUATION WAS PERFORMED  Charlett Blake 08/02/2022, 9:04 AM

## 2022-08-02 NOTE — Evaluation (Signed)
Physical Therapy Assessment and Plan  Patient Details  Name: Kara Bailey MRN: [id number] Date of Birth: 04-20-1956   PT Diagnosis: Abnormal posture, Abnormality of gait, Difficulty walking, Hemiplegia non-dominant, and Muscle weakness Rehab Potential: Fair ELOS: 3-4 weeks   Today's Date: 08/02/2022 PT Individual Time: 805-915    95 min   Hospital Problem: Principal Problem:   Ischemic stroke [facility])   Past Medical History:  Past Medical History:  Diagnosis Date   Anxiety    Chest pain 08/2014   unspecified   Cirrhosis ([place])    Depression    Dizziness 08/2014   Dyspnea 08/2014   Dysrhythmia    Emphysema lung (HCC)    GERD (gastroesophageal reflux disease)    Heart murmur    Hepatitis C    Hiatal hernia    Hypertension    PAC (premature atrial contraction) 10/11/2014   PAD (peripheral artery disease) (HCC)    Palpitations    PVC (premature ventricular contraction) 10/11/2014   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   Stroke ([place])    just slurred speech   Tachycardia 08/2014   Ventral hernia    Weakness 08/2014   Past Surgical History:  Past Surgical History:  Procedure Laterality Date   ABDOMINAL AORTOGRAM W/LOWER EXTREMITY N/A 07/13/2021   Procedure: ABDOMINAL AORTOGRAM W/LOWER EXTREMITY;  Surgeon: Lorretta Harp, MD;  Location: [place] [facility];  Service: Cardiovascular;  Laterality: N/A;   ABDOMINAL AORTOGRAM W/LOWER EXTREMITY N/A 03/19/2022   Procedure: ABDOMINAL AORTOGRAM W/LOWER EXTREMITY;  Surgeon: Lorretta Harp, MD;  Location: [place] [facility];  Service: Cardiovascular;  Laterality: N/A;   APPENDECTOMY     CATARACT EXTRACTION, BILATERAL     COLONOSCOPY     COLONOSCOPY WITH PROPOFOL N/A 04/30/2016   Procedure: COLONOSCOPY WITH PROPOFOL;  Surgeon: Daneil Dolin, MD;  [facility];  Service: Endoscopy;  Laterality: N/A;  [place] N/A 10/13/2021   Procedure: OPEN REPAIR EPIGASTRIC HERNIA WITH MESH PATCH;   Surgeon: Armandina Gemma, MD;  Location: [facility];  Service: General;  Laterality: N/A;   ESOPHAGOGASTRODUODENOSCOPY     approximately 2010   ESOPHAGOGASTRODUODENOSCOPY (EGD) WITH PROPOFOL N/A 04/30/2016   Procedure: ESOPHAGOGASTRODUODENOSCOPY (EGD) WITH PROPOFOL;  Surgeon: Daneil Dolin, MD;  [facility];  Service: Endoscopy;  Laterality: N/A;   LEFT HEART CATHETERIZATION WITH CORONARY ANGIOGRAM N/A 10/29/2014   07-14-20- pt denies this Procedure: LEFT HEART CATHETERIZATION WITH CORONARY ANGIOGRAM;  Surgeon: Burnell Blanks, MD;  Location: [facility] C[facility] LAB;  Service: Cardiovascular;  Laterality: N/A;   MALONEY DILATION N/A 04/30/2016   Procedure: Venia Minks DILATION;  Surgeon: Daneil Dolin, MD;  [facility];  Service: Endoscopy;  Laterality: N/A;   PERIPHERAL VASCULAR INTERVENTION  07/13/2021   Procedure: PERIPHERAL VASCULAR INTERVENTION;  Surgeon: Lorretta Harp, MD;  Location: [place] [facility];  Service: Cardiovascular;;  left SFA left external iliac   PERIPHERAL VASCULAR INTERVENTION Right 03/19/2022   Procedure: PERIPHERAL VASCULAR INTERVENTION;  Surgeon: Lorretta Harp, MD;  Location: [place] [facility];  Service: Cardiovascular;  Laterality: Right;  SFA   POLYPECTOMY  04/30/2016   Procedure: POLYPECTOMY;  Surgeon: Daneil Dolin, MD;  [facility];  Service: Endoscopy;;  Sigmoid colon polyp removed via hot snare   UPPER GASTROINTESTINAL ENDOSCOPY      Assessment & Plan Clinical Impression: Patient is a 66 year old female with history of prior left thalamic stroke on aspirin and Plavix  who presents with worsened left facial droop, dysarthria and left-sided weakness with decreased sensation on 07/26/2022.  Family reports had mild left sided weakness and mild speech changes at baseline due to prior CVA. Neurology was consulted and MRI was significant for patchy acute on chronic small vessel ischemia of the right corona radiata and posterior right lentiform.   Etiology likely chronic small vessel disease.  CT head negative for acute CVA, CTA head/neck negative for LVO.  2D echo reveals LVEF of greater than 75% and some small dilatation of left atrium.  Her LDL is 125 and hemoglobin A1c 5.4.  She was started on Lovenox for DVT prophylaxis.  She is currently tolerating dysphagia 1 diet with thin liquids. Report she has not had a BM is several days. The patient requires inpatient physical medicine and rehabilitation evaluations and treatment secondary to dysfunction due to small vessel ischemia of right corona radiata and posterior right lentiform. Patient transferred to CIR on 08/01/2022 .   Patient currently requires max with mobility secondary to muscle weakness, muscle joint tightness, and muscle paralysis, decreased cardiorespiratoy endurance, impaired timing and sequencing and unbalanced muscle activation, field cut, and decreased sitting balance, decreased standing balance, decreased postural control, hemiplegia, and decreased balance strategies.  Prior to hospitalization, patient was modified independent  with mobility and lived with Significant other in a [place] home.  Home access is  Level entry.  Patient will benefit from skilled PT intervention to maximize safe functional mobility, minimize fall risk, and decrease caregiver burden for planned discharge home with 24 hour assist.  Anticipate patient will benefit from follow up [facility] at discharge.  PT - End of Session Activity Tolerance: Tolerates 10 - 20 min activity with multiple rests Endurance Deficit: Yes PT Assessment Rehab Potential (ACUTE/IP ONLY): Fair PT Barriers to Discharge: Decreased caregiver support;Home environment access/layout;Incontinence;Lack of/limited family support;Insurance for SNF coverage PT Patient demonstrates impairments in the following area(s): Balance;Edema;Endurance;Motor;Safety;Sensory PT Transfers Functional Problem(s): Bed Mobility;Bed to Chair;Car;Furniture PT  Locomotion Functional Problem(s): Ambulation;Wheelchair Mobility PT Plan PT Intensity: Minimum of 1-2 x/day ,45 to 90 minutes PT Frequency: 5 out of 7 days PT Duration Estimated Length of Stay: 3-4 weeks PT Treatment/Interventions: Ambulation/gait training;Discharge planning;Functional mobility training;Psychosocial support;Therapeutic Activities;Visual/perceptual remediation/compensation;Balance/vestibular training;Disease management/prevention;Neuromuscular re-education;Skin care/wound management;Wheelchair propulsion/positioning;Therapeutic Exercise;Cognitive remediation/compensation;DME/adaptive equipment instruction;Pain management;Splinting/orthotics;UE/LE Strength taining/ROM;Community reintegration;Functional electrical stimulation;Patient/family education;UE/LE Arts development officer PT Transfers Anticipated Outcome(s): min assist transfers to and from [facility] PT Locomotion Anticipated Outcome(s): max assist gait with PT only. WC level supervision assit PT Recommendation Follow Up Recommendations: Home health PT Patient destination: Home Equipment Recommended: Wheelchair (measurements);Wheelchair cushion (measurements) Equipment Details: custom light weight WC   PT Evaluation Precautions/Restrictions Precautions Precautions: Fall Precaution Comments: L hemi. expressive aphasia Restrictions Weight Bearing Restrictions: No General   Vital SignsTherapy Vitals BP: (!) 144/82 Pain Pain Assessment Pain Scale: 0-10 Pain Interference Pain Interference Pain Effect on Sleep: 8. Unable to answer Pain Interference with Therapy Activities: 8. Unable to answer Pain Interference with Day-to-Day Activities: 8. Unable to answer Home Living/Prior Functioning Home Living Available Help at Discharge: Family Type of Home: Apartment Home Access: Level entry Home Layout: One level Bathroom Shower/Tub: Chiropodist: Handicapped height  Lives With: Significant  other Prior Function Level of Independence: Other (comment) (nods yes to sometimes needing RW. pt is caregiver for SO) Driving: Yes Vocation: Other (Comment) Vocation Requirements: per chart. 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 Vision/Perception  Vision - Assessment Eye Alignment: Within Functional Limits Ocular Range of Motion: Within  Functional Limits Alignment/Gaze Preference: Within Defined Limits Tracking/Visual Pursuits: Decreased smoothness of eye movement to LEFT superior field;Decreased smoothness of eye movement to LEFT inferior field;Unable to hold eye position out of midline;Other (comment) Saccades: Within functional limits Perception Perception: Impaired Inattention/Neglect: Does not attend to left side of body Praxis Praxis: Intact  Cognition Overall Cognitive Status: Difficult to assess (difficult to assess due to severe speech deficits but appear grossly intact) Arousal/Alertness: Awake/alert Orientation Level: Oriented X4 Year: 2023 Month: September Day of Week: Correct Attention: Focused;Selective;Sustained Focused Attention: Appears intact Sustained Attention: Appears intact Selective Attention: Appears intact Memory: Appears intact Awareness: Appears intact Problem Solving:  (minimal assessment to determine) Safety/Judgment: Appears intact Sensation Sensation Light Touch: Impaired Detail Light Touch Impaired Details: Impaired LLE;Impaired LUE Proprioception: Impaired Detail Proprioception Impaired Details: Impaired LUE;Impaired LLE Additional Comments: able to detect stimuli, reports decreased appreciation to deep pressure and light touch Coordination Gross Motor Movements are Fluid and Coordinated: No Fine Motor Movements are Fluid and Coordinated: No Coordination and Movement Description: dense L sided hemiplegia Finger Nose Finger Test: unable to perform on the LUE Heel Shin Test: unable to perform on the LLE Motor  Motor Motor: Hemiplegia Motor  - Skilled Clinical Observations: dense LUE and LLE hemiplegia   Trunk/Postural Assessment  Cervical Assessment Cervical Assessment: Exceptions to [facility] (R bias. able to scan to L with cues) Thoracic Assessment Thoracic Assessment: Exceptions to [facility] Lumbar Assessment Lumbar Assessment: Exceptions to [facility] Postural Control Postural Control: Deficits on evaluation Trunk Control: strong L lean in standing  Balance Balance Balance Assessed: Yes Standardized Balance Assessment Standardized Balance Assessment: FIST Function in Sitting Test = FIST Anterior Nudge: superior sternum: Upper extremity support Posterior Nudge: between scapular spines: Upper extremity support Lateral Nudge: to dominant side at acromion: Needs assistance Lateral Nudge Assistance: Mod Assist Static Sitting: 30 seconds: Upper extremity support Sitting, Shake "No": left and right: Verbal cues/increased time Sitting, Eyes Closed: 30 seconds: Needs assistance Sitting, Eyes Closed Assistance: Min Assist Sitting, Lift Foot: dominant side, lift foot 1 inch twice: Upper extremity support Pick Up Object From Behind: object at midline, hands breadth posterior: Needs assistance Pick Up Object From Behind Assistance: Mod Assist Forward Reach: use dominant arm, must complete full motion: Needs assistance Forward Reach Assistance: Min Assist Lateral Reach: use dominant arm, clear opposite ischial tuberosity: Needs assistance Lateral Reach Assistance: Min Assist Pick Up Object From Floor: from between feet: Needs assistance Pick Up Object From Floor  Assistance: Min Assist Posterior Scooting: move backwards 2 inches: Needs assistance Posterior Scooting Assistance: Max Assist Anterior Scooting: move forward 2 inches: Needs assistance Anterior Scooting Assistance: Max Assist Lateral Scooting: move to dominent side 2 inches: Needs assistance Lateral Scooting Assistance: Max Assist FIST TOTAL SCORE: 20 Static Sitting  Balance Static Sitting - Balance Support: Right upper extremity supported Static Sitting - Level of Assistance: 5: Stand by assistance;4: Min assist Dynamic Sitting Balance Dynamic Sitting - Balance Support: Right upper extremity supported Dynamic Sitting - Level of Assistance: 3: Mod assist;4: Min Insurance risk surveyor Standing - Balance Support: Right upper extremity supported Static Standing - Level of Assistance: 2: Max assist Dynamic Standing Balance Dynamic Standing - Balance Support: Right upper extremity supported Dynamic Standing - Level of Assistance: 2: Max assist;1: +1 Total assist Extremity Assessment      RLE Assessment RLE Assessment: Within Functional Limits LLE Assessment LLE Assessment: Exceptions to [facility] Strength Comments: 1/5 hip extension in supine. 0/5 all others  Care Tool Care Tool  Bed Mobility Roll left and right activity   Roll left and right assist level: Maximal Assistance - Patient 25 - 49%    Sit to lying activity   Sit to lying assist level: Maximal Assistance - Patient 25 - 49%    Lying to sitting on side of bed activity   Lying to sitting on side of bed assist level: the ability to move from lying on the back to sitting on the side of the bed with no back support.: Maximal Assistance - Patient 25 - 49%     Care Tool Transfers Sit to stand transfer   Sit to stand assist level: Maximal Assistance - Patient 25 - 49%    Chair/bed transfer   Chair/bed transfer assist level: Maximal Assistance - Patient 25 - 49%     Psychologist, counselling transfer activity did not occur: Safety/medical concerns        Care Tool Locomotion Ambulation Ambulation activity did not occur: Safety/medical concerns        Walk 10 feet activity Walk 10 feet activity did not occur: Safety/medical concerns       Walk 50 feet with 2 turns activity Walk 50 feet with 2 turns activity did not occur: Safety/medical concerns       Walk 150 feet activity Walk 150 feet activity did not occur: Safety/medical concerns      Walk 10 feet on uneven surfaces activity Walk 10 feet on uneven surfaces activity did not occur: Safety/medical concerns      Stairs Stair activity did not occur: Safety/medical concerns        Walk up/down 1 step activity Walk up/down 1 step or curb (drop down) activity did not occur: Safety/medical concerns      Walk up/down 4 steps activity Walk up/down 4 steps activity did not occur: Safety/medical concerns      Walk up/down 12 steps activity Walk up/down 12 steps activity did not occur: Safety/medical concerns      Pick up small objects from floor Pick up small object from the floor (from standing position) activity did not occur: Safety/medical concerns      Wheelchair   Type of Wheelchair: Manual   Wheelchair assist level: Dependent - Patient 0% Max wheelchair distance: 150  Wheel 50 feet with 2 turns activity   Assist Level: Dependent - Patient 0%  Wheel 150 feet activity   Assist Level: Dependent - Patient 0%    Refer to Care Plan for Long Term Goals  SHORT TERM GOAL WEEK 1 PT Short Term Goal 1 (Week 1): Pt will perform bed mobility with max assist of 1 consistently PT Short Term Goal 2 (Week 1): Pt will sit EOB with CGA >3 mintues PT Short Term Goal 3 (Week 1): Pt will propell WC >100with min assist PT Short Term Goal 4 (Week 1): Pt will tolerate standing >1 min with max assist from PT  Recommendations for other services: Therapeutic Recreation  Stress management  Skilled Therapeutic Intervention Mobility Bed Mobility Bed Mobility: Rolling Right;Supine to Sit;Sit to Supine;Rolling Left Rolling Right: Maximal Assistance - Patient 25-49% Rolling Left: Maximal Assistance - Patient 25-49% Supine to Sit: Maximal Assistance - Patient - Patient 25-49% Sit to Supine: Maximal Assistance - Patient 25-49% Transfers Transfers: Sit to Musician  via Lift Equipment Sit to Stand: Maximal Assistance - Patient 25-49% Squat Pivot Transfers: Maximal Assistance - Patient 25-49% Transfer via Lift Equipment: Probation officer  Ambulation: No Gait Gait: No Stairs / Additional Locomotion Stairs: No Wheelchair Mobility Wheelchair Mobility: Yes Wheelchair Assistance: Dependent - Patient 0% Wheelchair Parts Management: Needs assistance Distance: 150  Pt received supine in bed and agreeable to PT. Supine>sit transfer with max assist and max cues for safety and attention to the LUE. Notd pain in shoulder with transfers. Improved with proxmal pressure from PT. PT instructed patient in PT Evaluation and initiated treatment intervention; see above for results. PT educated patient in [place], rehab potential, rehab goals, and discharge recommendations along with recommendation for follow-up rehabilitation services.  Stedy transfer with mod=max assist for control of LUE and to maintain midline. Pt transported to rehab gym in [facility]. Function In Sitting Test (FIST)  (1/2 femur on surface; hips/knees flexed to 90deg)   TOTAL = 20/56. Notes/comments: see above performed on bed.  MCD > 5 points MCID for IP REHAB > 6 points  Patient returned to room and left sitting in [facility] with call bell in reach and all needs met.        Discharge Criteria: Patient will be discharged from PT if patient refuses treatment 3 consecutive times without medical reason, if treatment goals not met, if there is a change in medical status, if patient makes no progress towards goals or if patient is discharged from hospital.  The above assessment, treatment plan, treatment alternatives and goals were discussed and mutually agreed upon: by patient  Lorie Phenix 08/03/2022, 7:58 AM

## 2022-08-02 NOTE — Progress Notes (Signed)
Patient ID: Kara Bailey, female   DOB: [birth year], 66 y.o.   MRN: [id number] Met with the patient to review current situation, team conference, rehab process and plan of care. Patient with expressive aphasia but is able to communicate through writing. Noted she was aware of risk factors with previous stroke. Reviewed medications for HTN, HLD and DAPT. Discussed smoking risks and patient noted she was wearing a nicotine patch. Leg pain addressed and continent. Continue to follow along to address educational needs to facilitate preparation for discharge home with boyfriend. Margarito Liner

## 2022-08-02 NOTE — Plan of Care (Signed)
  Problem: RH Balance Goal: LTG: Patient will maintain dynamic sitting balance (OT) Description: LTG:  Patient will maintain dynamic sitting balance with assistance during activities of daily living (OT) Flowsheets (Taken 08/02/2022 1252) LTG: Pt will maintain dynamic sitting balance during ADLs with: Supervision/Verbal cueing Goal: LTG Patient will maintain dynamic standing with ADLs (OT) Description: LTG:  Patient will maintain dynamic standing balance with assist during activities of daily living (OT)  Flowsheets (Taken 08/02/2022 1252) LTG: Pt will maintain dynamic standing balance during ADLs with: Minimal Assistance - Patient > 75%   Problem: Sit to Stand Goal: LTG:  Patient will perform sit to stand in prep for activites of daily living with assistance level (OT) Description: LTG:  Patient will perform sit to stand in prep for activites of daily living with assistance level (OT) Flowsheets (Taken 08/02/2022 1252) LTG: PT will perform sit to stand in prep for activites of daily living with assistance level: Contact Guard/Touching assist   Problem: RH Grooming Goal: LTG Patient will perform grooming w/assist,cues/equip (OT) Description: LTG: Patient will perform grooming with assist, with/without cues using equipment (OT) Flowsheets (Taken 08/02/2022 1252) LTG: Pt will perform grooming with assistance level of: Set up assist    Problem: RH Bathing Goal: LTG Patient will bathe all body parts with assist levels (OT) Description: LTG: Patient will bathe all body parts with assist levels (OT) Flowsheets (Taken 08/02/2022 1252) LTG: Pt will perform bathing with assistance level/cueing: Contact Guard/Touching assist   Problem: RH Dressing Goal: LTG Patient will perform upper body dressing (OT) Description: LTG Patient will perform upper body dressing with assist, with/without cues (OT). Flowsheets (Taken 08/02/2022 1252) LTG: Pt will perform upper body dressing with assistance level of:  Contact Guard/Touching assist Goal: LTG Patient will perform lower body dressing w/assist (OT) Description: LTG: Patient will perform lower body dressing with assist, with/without cues in positioning using equipment (OT) Flowsheets (Taken 08/02/2022 1252) LTG: Pt will perform lower body dressing with assistance level of: Minimal Assistance - Patient > 75%   Problem: RH Toileting Goal: LTG Patient will perform toileting task (3/3 steps) with assistance level (OT) Description: LTG: Patient will perform toileting task (3/3 steps) with assistance level (OT)  Flowsheets (Taken 08/02/2022 1252) LTG: Pt will perform toileting task (3/3 steps) with assistance level: Minimal Assistance - Patient > 75%   Problem: RH Functional Use of Upper Extremity Goal: LTG Patient will use RT/LT upper extremity as a (OT) Description: LTG: Patient will use right/left upper extremity as a stabilizer/gross assist/diminished/nondominant/dominant level with assist, with/without cues during functional activity (OT) Flowsheets (Taken 08/02/2022 1252) LTG: Use of upper extremity in functional activities: LUE as a stabilizer LTG: Pt will use upper extremity in functional activity with assistance level of: Supervision/Verbal cueing   Problem: RH Toilet Transfers Goal: LTG Patient will perform toilet transfers w/assist (OT) Description: LTG: Patient will perform toilet transfers with assist, with/without cues using equipment (OT) Flowsheets (Taken 08/02/2022 1252) LTG: Pt will perform toilet transfers with assistance level of: Minimal Assistance - Patient > 75%   Problem: RH Tub/Shower Transfers Goal: LTG Patient will perform tub/shower transfers w/assist (OT) Description: LTG: Patient will perform tub/shower transfers with assist, with/without cues using equipment (OT) Flowsheets (Taken 08/02/2022 1252) LTG: Pt will perform tub/shower stall transfers with assistance level of: Minimal Assistance - Patient > 75% LTG: Pt will  perform tub/shower transfers from: Tub/shower combination

## 2022-08-03 DIAGNOSIS — I639 Cerebral infarction, unspecified: Secondary | ICD-10-CM | POA: Diagnosis not present

## 2022-08-03 MED ORDER — ENSURE ENLIVE PO LIQD
237.0000 mL | Freq: Three times a day (TID) | ORAL | Status: DC
  Administered 2022-08-03 – 2022-09-07 (×39): 237 mL via ORAL

## 2022-08-03 NOTE — Progress Notes (Signed)
Speech Language Pathology Daily Session Note  Patient Details  Name: Kara Bailey MRN: [id number] Date of Birth: [birth year]  Today's Date: 08/03/2022 SLP Individual Time: 0800-0900 SLP Individual Time Calculation (min): 60 min  Short Term Goals: Week 1: SLP Short Term Goal 1 (Week 1): Patient will communicate functional wants/needs/wishes/feelings through multimodal communication and min A verbal/visual cues for effectiveness SLP Short Term Goal 2 (Week 1): Patient will approximate production of single syllable words with max A verbal/visual cues to achieve 50% intelligibility SLP Short Term Goal 3 (Week 1): Patient will sustain vowel sound for up 5 seconds during 3 of 5 occasions with max A cues SLP Short Term Goal 4 (Week 1): Patient will utilize low tech communcation supports Passenger transport manager) and/or written communication to convey message with min A cues for effectiveness SLP Short Term Goal 5 (Week 1): Pt will consume dysphagia 1 textures with thin liquids with improved awareness of bolus/spillage, oral residuals, and without overt s/sx of aspiration with mod A to implement swallowing precautions and strategies  Skilled Therapeutic Interventions: Skilled ST treatment focused on speech and swallowing goals. Pt greeted awake in bed and consuming what appeared to be small and controlled sips of thin liquids from cup. Mild anterior spillage noted on right (stronger side) with liquids. Pt exhibited no overt s/sx of aspiration and denied any coughing episodes this a.m. prior to SLP's arrival. It appears family member brought pt's upper/lower denture however pt stated she typically does not wear them and denied consideration for wear this morning. SLP provided set-up A for breakfast with pureed consistencies. Pt exhibited adequate oral control with minimal anterior spillage with thinner pureed consistencies, such as grits. She exhibited increased spillage on left (weak side) and mild-to-mod oral  residuals with thicker consistencies, such as yogurt. SLP crumbled pieces of soft fig newton bar into yogurt to trial dysphagia 2 textures. Pt exhibited mildly prolonged mastication, mild anterior spillage on left, and mild buccal stasis. Pt exhibited increased awareness to anterior spillage and frequently wiped lips between bites with overall sup A to implement strategies. Provided instruction to perform lingual sweep and gently pressing on left cheek to redirect buccal stasis to center of oral cavity. Carry over was excellent and pt continued to implement with occasional reminders throughout meal. SLP recommends continuation of dysphagia 1 textures and thin liquids with intermittent supervision following set-up A. Continue dysphagia 2 trials with SLP. Verbalized plan with pt; pt nodded head in agreement.   Pt made minimal attempts to vocalize during session, however able to communicate functional needs through responding to yes/no questions and gestures in order to communicate preferences with breakfast, comfort, etc. Pt utilized written communication to communicate in a more open ended context with mod I. Pt was encouraged to try to verbally respond to yes/no questions. Following this encouragement, she frequently initiated verbal responses including "yes", "no", "thank you" with ~50% intelligibility, still limited by substantially reduced vocal intensity. SLP provided pt with low tech communication support with 16 cell communication board. Pt utilized effectively at independent level and also initiated speech practice with verbalizing at the word level with ~25-50% intelligibility. Patient was left in bed with alarm activated and immediate needs within reach at end of session. Continue per current plan of care.      Pain Pain Assessment Pain Scale: 0-10  Therapy/Group: Individual Therapy  Patty Sermons 08/03/2022, 8:09 AM

## 2022-08-03 NOTE — Progress Notes (Signed)
Physical Therapy Session Note  Patient Details  Name: Kara Bailey MRN: [id number] Date of Birth: Oct 02, 1956  Today's Date: 08/03/2022 PT Individual Time: 1130-1200 PT Individual Time Calculation (min): 30 min   Short Term Goals: Week 1:  PT Short Term Goal 1 (Week 1): Pt will perform bed mobility with max assist of 1 consistently PT Short Term Goal 2 (Week 1): Pt will sit EOB with CGA >3 mintues PT Short Term Goal 3 (Week 1): Pt will propell WC >100with min assist PT Short Term Goal 4 (Week 1): Pt will tolerate standing >1 min with max assist from PT  Skilled Therapeutic Interventions/Progress Updates:  Patient seated upright in w/c on entrance to room. Patient alert and agreeable to PT session. Is able to comprehend requests from PT throughout session and also says "grandbaby" when asked who drew the picture on her tray table.   Patient with no pain complaint at start of session.  Therapeutic Activity: Transfers: Pt performed sit<>stand transfers throughout session with ModA to perform with bias to R side. MaxA provided to L knee for stability. Provided verbal cues for technique including forward lean and push from armrest.  Neuromuscular Re-ed: NMR facilitated during session with focus on standing balance, proprioception, muscle activation. Pt guided in sit<>stand performance from w/c to hold to back of armchair. Pt attempts to increase lean over RLE and PT facilitates increased lateral shift to LLE for more midline positioning. Lateral weight shifts performed with vc/ tc for L knee extension in order to improve balance, improve joint approximation over LLE and attempt to force use. Pt requires up to mod/ MaxA with shift to L side. During second and third standing bouts, pt guided in minisquats x10 with pt able to perform with vc and requiring MaxA for LLE d/t no noted muscle activation to quads or glutes throughout.   NMR performed for improvements in motor control and coordination,  balance, sequencing, judgement, and self confidence/ efficacy in performing all aspects of mobility at highest level of independence.   Patient seated upright in w/c at end of session with brakes locked, belt alarm set, and all needs within reach. Tray table positioned in front of pt for upcoming lunch arrival.    Therapy Documentation Precautions:  Precautions Precautions: Fall Precaution Comments: L hemi. expressive aphasia Restrictions Weight Bearing Restrictions: No General:   Vital Signs:  Pain: Pain Assessment Pain Scale: 0-10 Pain Score: 0-No pain  Therapy/Group: Individual Therapy  Alger Simons PT, DPT, CSRS 08/03/2022, 6:46 PM

## 2022-08-03 NOTE — IPOC Note (Signed)
Overall Plan of Care [facility]) Patient Details Name: Kara Bailey MRN: [id number] DOB: [birth year]  Admitting Diagnosis: Ischemic stroke [facility])  Hospital Problems: Principal Problem:   Ischemic stroke [facility])     Functional Problem List: Nursing Bladder, Bowel, Pain, Edema, Safety, Endurance, Sensory, Medication Management, Skin Integrity, Motor  PT Balance, Edema, Endurance, Motor, Safety, Sensory  OT Balance, Endurance, Motor, Vision, Perception, Safety, Sensory  SLP Motor, Nutrition  TR         Basic ADL's: OT Grooming, Bathing, Dressing, Toileting     Advanced  ADL's: OT       Transfers: PT Bed Mobility, Bed to Chair, Car, Manufacturing systems engineer, Metallurgist: PT Ambulation, Emergency planning/management officer     Additional Impairments: OT Fuctional Use of Upper Extremity  SLP Swallowing, Communication expression    TR      Anticipated Outcomes Item Anticipated Outcome  Self Feeding set up  Swallowing  sup A   Basic self-care  min A  Toileting  min A   Bathroom Transfers Min A  Bowel/Bladder  Incontinent of urine, BM prior to admit  Transfers  min assist transfers to and from [facility]  Locomotion  max assist gait with PT only. WC level supervision assit  Communication  min A  Cognition     Pain  less than 3  Safety/Judgment  remain fall free while in rehab   Therapy Plan: PT Intensity: Minimum of 1-2 x/day ,45 to 90 minutes PT Frequency: 5 out of 7 days PT Duration Estimated Length of Stay: 3-4 weeks OT Intensity: Minimum of 1-2 x/day, 45 to 90 minutes OT Frequency: 5 out of 7 days, Total of 15 hours over 7 days of combined therapies OT Duration/Estimated Length of Stay: 28-30 days SLP Intensity: Minumum of 1-2 x/day, 30 to 90 minutes SLP Frequency: 3 to 5 out of 7 days SLP Duration/Estimated Length of Stay: 3-4 weeks   Team Interventions: Nursing Interventions Patient/Family Education, Disease Management/Prevention, Discharge Planning, Bladder  Management, Pain Management, Psychosocial Support, Bowel Management, Medication Management, Dysphagia/Aspiration Precaution Training  PT interventions Ambulation/gait training, Discharge planning, Functional mobility training, Psychosocial support, Therapeutic Activities, Visual/perceptual remediation/compensation, Balance/vestibular training, Disease management/prevention, Neuromuscular re-education, Skin care/wound management, Wheelchair propulsion/positioning, Therapeutic Exercise, Cognitive remediation/compensation, DME/adaptive equipment instruction, Pain management, Splinting/orthotics, UE/LE Strength taining/ROM, Community reintegration, Technical sales engineer stimulation, Patient/family education, UE/LE Coordination activities, Stair training  OT Interventions Training and development officer, Discharge planning, DME/adaptive equipment instruction, Neuromuscular re-education, Functional mobility training, Patient/family education, Psychosocial support, Self Care/advanced ADL retraining, Therapeutic Activities, Therapeutic Exercise, UE/LE Strength taining/ROM, UE/LE Coordination activities, Visual/perceptual remediation/compensation  SLP Interventions Dysphagia/aspiration precaution training, Speech/Language facilitation, Oral motor exercises, Therapeutic Activities, Functional tasks, Patient/family education, Multimodal communication approach  TR Interventions    SW/CM Interventions Discharge Planning, Psychosocial Support, Patient/Family Education, Disease Management/Prevention   Barriers to Discharge MD  Medical stability  Nursing Decreased caregiver support, Incontinence Lives in one level appartment with no ste  PT Decreased caregiver support, Home environment access/layout, Incontinence, Lack of/limited family support, Insurance for SNF coverage    OT Decreased caregiver support unclear who will be able to help pt as her SO is disabled and the pt is his caregiver  SLP  (question if pt may have  access to 24 hour supervision)    [place] for SNF coverage, Decreased caregiver support, Lack of/limited family support     Team Discharge Planning: Destination: PT-Home ,OT- Home , SLP-Home Projected Follow-up: PT-Home health PT, OT-  Home health OT, SLP-24 hour  supervision/assistance, Outpatient SLP, Home Health SLP Projected Equipment Needs: PT-Wheelchair (measurements), Wheelchair cushion (measurements), OT- To be determined, SLP-To be determined Equipment Details: PT-custom light weight WC, OT-pt has a tub bench and standard BSC from her SO Patient/family involved in discharge planning: PT- Patient,  OT-Patient, SLP-Patient  MD ELOS: 18-21d Medical Rehab Prognosis:  Good Assessment: The patient has been admitted for CIR therapies with the diagnosis of Ischemic stroke with Left hemiparesis. The team will be addressing functional mobility, strength, stamina, balance, safety, adaptive techniques and equipment, self-care, bowel and bladder mgt, patient and caregiver education, severe dysarthria. Goals have been set at [place]. Anticipated discharge destination is Home .        See Team Conference Notes for weekly updates to the plan of care

## 2022-08-03 NOTE — Progress Notes (Signed)
Physical Therapy Session Note  Patient Details  Name: Kara Bailey MRN: [id number] Date of Birth: 1956-06-25  Today's Date: 08/03/2022 PT Individual Time: 3474-2595 PT Individual Time Calculation (min): 70 min   Short Term Goals: Week 1:  PT Short Term Goal 1 (Week 1): Pt will perform bed mobility with max assist of 1 consistently PT Short Term Goal 2 (Week 1): Pt will sit EOB with CGA >3 mintues PT Short Term Goal 3 (Week 1): Pt will propell WC >100with min assist PT Short Term Goal 4 (Week 1): Pt will tolerate standing >1 min with max assist from PT Week 2:    Week 3:     Skilled Therapeutic Interventions/Progress Updates:   Pt received sitting in WC and agreeable to PT. Pt transported to rehab gym in [facility]. Sqaut pivot transfer to mat table with max assist and LLE blocked. Sit>supine with max assist fotr LUE/LLE management. Bridge x 6 with 3 saec hold. Noted to have mild activation in the L hip to perform bridge. PT applied NMES L musle atrophy x 10 min with  assist from PT to attain full ROM with stimulation. Performed at amplitude of 39. Pt performed supine NMR. Pelvic rotation R and L x 10. Sidelying trunk rotation x 10 in partial range to activate shoulder girdle and L side obliques. Rolling supine to the r WITH MAX ASSIST X 5. Sitting balance EOB x 5 min with intermittent UE support with supervision assist-min assist to improve midline. Pt transported to rail inhall. Sit<>stand x 3 with mod-max assist and LLE blocked to force WB through BLE. Pregait stepping 3 x 5BLE with max-total assist for LLE management. Noted to have trace activation in advancement and hip extension in stance. Pt returned to room and performed squat pivot transfer to bed with LLE block and max assist. Sit>supine completed with max assist, and left supine in bed with call bell in reach and all needs met.        Therapy Documentation Precautions:  Precautions Precautions: Fall Precaution Comments: L hemi.  expressive aphasia Restrictions Weight Bearing Restrictions: No  Vital Signs: Therapy Vitals Pulse Rate: (!) 103 Resp: 18 BP: (!) 151/92 Patient Position (if appropriate): Sitting Oxygen Therapy SpO2: 100 % O2 Device: Room Air Pain: Pain Assessment Pain Scale: 0-10 Pain Score: 0-No pain  Therapy/Group: Individual Therapy  Lorie Phenix 08/03/2022, 4:51 PM

## 2022-08-03 NOTE — Progress Notes (Signed)
Occupational Therapy Session Note  Patient Details  Name: Kara Bailey MRN: [id number] Date of Birth: 1956-09-21  Today's Date: 08/03/2022 OT Individual Time: 0930-1030 OT Individual Time Calculation (min): 60 min    Short Term Goals: Week 1:  OT Short Term Goal 1 (Week 1): Pt will be able to complete a sq pivot toilet transfer with max A of 1. OT Short Term Goal 2 (Week 1): Pt will be able to rise to stand and hold grab bar with R hand with mod A of 1. OT Short Term Goal 3 (Week 1): Pt will hold static stand with RUE support during LB self care. OT Short Term Goal 4 (Week 1): Pt will don a pullover shirt with min A OT Short Term Goal 5 (Week 1): Pt will demonstrate self management of L arm with positioning with min cues.  Skilled Therapeutic Interventions/Progress Updates:    Pt received in bed ready for therapy. Her nurse tech was present and she was able to assist as a +2 for the first part of the session.  NT removed her purewick.  Layed bed flat and had pt begin to learn to move from supine to L sidelying to sit to R.  She did well with instructions and able to do so with mod A.  Needed mod A initially with static sit EOB progressing to min A.  Used stedy lift to work on sit to stands and static standing for LB bathing and donning clothing. Pt showed improvement with rising to stand using her legs and improved forward lean.  In standing she needed mod A to shift wt to R but overall less of a lean to L. In standing in stedy, pt able to use R hand to cleanse front perineal area.  Post dressing, pt cleaned gums with toothbrush, applied denture paste and applied them in mouth with set up assist.  Pt taken to gym to work table top slides for LUE.  Trace movement noted in shoulder. Increased tone in L bicep vs yesterday.  Pt participated well. Returned to room with belt alarm on and all needs met.   Therapy Documentation Precautions:  Precautions Precautions: Fall Precaution Comments: L  hemi. expressive aphasia Restrictions Weight Bearing Restrictions: No    Vital Signs: Therapy Vitals BP: (Abnormal) 144/82 Pain: Pain Assessment Pain Scale: 0-10 ADL: ADL Eating: Supervision/safety Grooming: Minimal assistance Upper Body Bathing: Minimal assistance Lower Body Bathing: Moderate assistance Upper Body Dressing: Moderate assistance Lower Body Dressing: Maximal assistance Toileting: Dependent (+2 A) Toilet Transfer: Dependent (stedy +2)    Therapy/Group: Individual Therapy  Raniya Golembeski 08/03/2022, 8:55 AM

## 2022-08-03 NOTE — Progress Notes (Signed)
PROGRESS NOTE   Subjective/Complaints:  Reviewed doppler result   ROS-limited by severe dysarthria  Objective:   VAS [place] LOWER EXTREMITY VENOUS (DVT)  Result Date: 08/02/2022  Lower Venous DVT Study Patient Name:  Kara Bailey  Date of Exam:   08/02/2022 Medical Rec #: [id number]         Accession #:    [id number] Date of Birth: 1956-04-20          Patient Gender: F Patient Age:   66 years Exam Location:  [facility] Procedure:      VAS [place] LOWER EXTREMITY VENOUS (DVT) Referring Phys: Risa Grill --------------------------------------------------------------------------------  Indications: Edema.  Comparison Study: no prior Performing Technologist: Archie Patten RVS  Examination Guidelines: A complete evaluation includes B-mode imaging, spectral Doppler, color Doppler, and power Doppler as needed of all accessible portions of each vessel. Bilateral testing is considered an integral part of a complete examination. Limited examinations for reoccurring indications may be performed as noted. The reflux portion of the exam is performed with the patient in reverse Trendelenburg.  +-----+---------------+---------+-----------+----------+--------------+ RIGHTCompressibilityPhasicitySpontaneityPropertiesThrombus Aging +-----+---------------+---------+-----------+----------+--------------+ CFV  Full           Yes      Yes                                 +-----+---------------+---------+-----------+----------+--------------+   +--------+---------------+---------+-----------+----------+--------------------+ LEFT    CompressibilityPhasicitySpontaneityPropertiesThrombus Aging       +--------+---------------+---------+-----------+----------+--------------------+ CFV     Full           Yes      Yes                                       +--------+---------------+---------+-----------+----------+--------------------+ SFJ      Full                                                              +--------+---------------+---------+-----------+----------+--------------------+ FV Prox Full                                                              +--------+---------------+---------+-----------+----------+--------------------+ FV Mid                 Yes      Yes                  patent by color  doppler              +--------+---------------+---------+-----------+----------+--------------------+ FV                     Yes      Yes                  patent by color      Distal                                               doppler              +--------+---------------+---------+-----------+----------+--------------------+ PFV     Full                                                              +--------+---------------+---------+-----------+----------+--------------------+ POP     Full           Yes      Yes                                       +--------+---------------+---------+-----------+----------+--------------------+ PTV     Full                                                              +--------+---------------+---------+-----------+----------+--------------------+ PERO    Full                                                              +--------+---------------+---------+-----------+----------+--------------------+    Summary: RIGHT: - No evidence of common femoral vein obstruction.  LEFT: - There is no evidence of deep vein thrombosis in the lower extremity.  - No cystic structure found in the popliteal fossa.  *See table(s) above for measurements and observations.    Preliminary    Recent Labs    08/02/22 0715  WBC 11.2*  HGB 14.2  HCT 43.6  PLT 268    Recent Labs    07/31/22 1027 08/02/22 0715  NA 143 142  K 4.1 4.0  CL 110 110  CO2 23 24  GLUCOSE 93 108*  BUN 10 19  CREATININE 0.90  0.99  CALCIUM 9.8 9.7     Intake/Output Summary (Last 24 hours) at 08/03/2022 0837 Last data filed at 08/03/2022 0051 Gross per 24 hour  Intake 160 ml  Output 200 ml  Net -40 ml         Physical Exam: Vital Signs Blood pressure (!) 144/82, pulse (!) 103, temperature 97.8 F (36.6 C), temperature source Oral, resp. rate 16, weight 59 kg, SpO2 100 %.   General: No acute distress Mood and affect are appropriate Heart: Regular rate and rhythm no rubs  murmurs or extra sounds Lungs: Clear to auscultation, breathing unlabored, no rales or wheezes Abdomen: Positive bowel sounds, soft nontender to palpation, nondistended Extremities: No clubbing, cyanosis, or edema Skin: No evidence of breakdown, no evidence of rash Neurologic: Cranial nerves II through XII intact, motor strength is 5/5 in right , 0/5 left deltoid, bicep, tricep, grip, hip flexor, knee extensors, ankle dorsiflexor and plantar flexor Sensory exam limited by severe dysarthria, not great with y/n responses  Musculoskeletal: Full range of motion in all 4 extremities. No joint swelling   Assessment/Plan: 1. Functional deficits which require 3+ hours per day of interdisciplinary therapy in a comprehensive inpatient rehab setting. Physiatrist is providing close team supervision and 24 hour management of active medical problems listed below. Physiatrist and rehab team continue to assess barriers to discharge/monitor patient progress toward functional and medical goals  Care Tool:  Bathing    Body parts bathed by patient: Chest, Abdomen, Right upper leg, Left upper leg, Face, Left arm   Body parts bathed by helper: Right arm, Front perineal area, Buttocks, Right lower leg, Left lower leg     Bathing assist Assist Level: Moderate Assistance - Patient 50 - 74%     Upper Body Dressing/Undressing Upper body dressing   What is the patient wearing?: Bra, Pull over shirt    Upper body assist Assist Level: Moderate  Assistance - Patient 50 - 74%    Lower Body Dressing/Undressing Lower body dressing      What is the patient wearing?: Underwear/pull up, Pants     Lower body assist Assist for lower body dressing: Maximal Assistance - Patient 25 - 49%     Toileting Toileting    Toileting assist Assist for toileting: 2 Helpers     Transfers Chair/bed transfer  Transfers assist  Chair/bed transfer activity did not occur: Safety/medical concerns  Chair/bed transfer assist level: Maximal Assistance - Patient 25 - 49%     Locomotion Ambulation   Ambulation assist   Ambulation activity did not occur: Safety/medical concerns          Walk 10 feet activity   Assist  Walk 10 feet activity did not occur: Safety/medical concerns        Walk 50 feet activity   Assist Walk 50 feet with 2 turns activity did not occur: Safety/medical concerns         Walk 150 feet activity   Assist Walk 150 feet activity did not occur: Safety/medical concerns         Walk 10 feet on uneven surface  activity   Assist Walk 10 feet on uneven surfaces activity did not occur: Safety/medical concerns         Wheelchair     Assist   Type of Wheelchair: Manual    Wheelchair assist level: Dependent - Patient 0% Max wheelchair distance: 150    Wheelchair 50 feet with 2 turns activity    Assist        Assist Level: Dependent - Patient 0%   Wheelchair 150 feet activity     Assist      Assist Level: Dependent - Patient 0%   Blood pressure (!) 144/82, pulse (!) 103, temperature 97.8 F (36.6 C), temperature source Oral, resp. rate 16, weight 59 kg, SpO2 100 %.  Medical Problem List and Plan: 1. 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  Antithrombotics: -DVT/anticoagulation:  Pharmaceutical: Lovenox doppler without evidence of DVT              -antiplatelet therapy: continue Plavix and aspirin as PTA 3. Pain Management: tylenol as needed 4. Mood/Behavior/Sleep: LCSW to evaluate and provide emotional support             -antipsychotic agents: n/a             -bupropion ER 150 mg q AM             -mirtazapine 30 mg q HS             -alprazolam 0.5 mg daily prn anxiety 5. Neuropsych/cognition: This patient is capable of making decisions on her own behalf. 6. Skin/Wound Care: routine skin care checks 7. Fluids/Electrolytes/Nutrition: routine Is and Os and follow-up chemistries             Dysphagia 1 diet/thin-SLP eval 8: Left-sided weakness/decreased sensation: check LLE venous duplex 9: Hypertension: continue to monitor; BP mildly elevated but stable overall, avoid hypotension             -continue Cardizem 180 mg daily 10: Hyperlipidemia: LDL = 125, counsel regarding healthy diet             -continue  ezetimibe 10 mg daily             -continue atorvastatin 90 mg daily 11: Tobacco use: cessation counseling             -continue nicotine patch 21 mg 12. Constipation             -Order sorbitol 13. GERD             -Continue PPI    LOS: 2 days A FACE TO FACE EVALUATION WAS PERFORMED  Charlett Blake 08/03/2022, 8:37 AM

## 2022-08-04 DIAGNOSIS — R471 Dysarthria and anarthria: Secondary | ICD-10-CM

## 2022-08-04 DIAGNOSIS — K5901 Slow transit constipation: Secondary | ICD-10-CM | POA: Diagnosis not present

## 2022-08-04 DIAGNOSIS — I639 Cerebral infarction, unspecified: Secondary | ICD-10-CM | POA: Diagnosis not present

## 2022-08-04 DIAGNOSIS — I10 Essential (primary) hypertension: Secondary | ICD-10-CM | POA: Diagnosis not present

## 2022-08-04 DIAGNOSIS — I69391 Dysphagia following cerebral infarction: Secondary | ICD-10-CM

## 2022-08-04 MED ORDER — ORAL CARE MOUTH RINSE
15.0000 mL | OROMUCOSAL | Status: DC
  Administered 2022-08-05 – 2022-09-07 (×95): 15 mL via OROMUCOSAL

## 2022-08-04 MED ORDER — ORAL CARE MOUTH RINSE
15.0000 mL | OROMUCOSAL | Status: DC | PRN
  Administered 2022-08-31: 15 mL via OROMUCOSAL

## 2022-08-04 NOTE — Progress Notes (Signed)
Speech Language Pathology Daily Session Note  Patient Details  Name: Kara Bailey MRN: [id number] Date of Birth: 10/10/1956  Today's Date: 08/04/2022 SLP Individual Time: 1330-1415 SLP Individual Time Calculation (min): 45 min  Short Term Goals: Week 1: SLP Short Term Goal 1 (Week 1): Patient will communicate functional wants/needs/wishes/feelings through multimodal communication and min A verbal/visual cues for effectiveness SLP Short Term Goal 2 (Week 1): Patient will approximate production of single syllable words with max A verbal/visual cues to achieve 50% intelligibility SLP Short Term Goal 3 (Week 1): Patient will sustain vowel sound for up 5 seconds during 3 of 5 occasions with max A cues SLP Short Term Goal 4 (Week 1): Patient will utilize low tech communcation supports (communication board) and/or written communication to convey message with min A cues for effectiveness SLP Short Term Goal 5 (Week 1): Pt will consume dysphagia 1 textures with thin liquids with improved awareness of bolus/spillage, oral residuals, and without overt s/sx of aspiration with mod A to implement swallowing precautions and strategies  Skilled Therapeutic Interventions: Pt seen for skilled ST with focus on speech and swallowing goals, pt received in wheelchair with family present and brought to ST room for therapeutic tasks. SLP facilitating CV and VC production by providing mirror for visual aid/feedback and overall mod A verbal cues. Pt with noted increased difficulty producing voiceless sounds (/f/ /p/ etc). Pt utilizing white board to communicate mildly complex information with Supervision A cues for effectiveness. Pt benefits from encouragement to utilize verbalizations, able to produce "yes" "no" "Madalee" and names of her sister, children and boyfriend. SLP facilitating intake of Dys 2 textures, noted to have eaten <25% of Dys 1 lunch meal. Pt with significantly extended oral phase and minimal loss of  bolus anteriorly, no overt s/s aspiration. Pt not appropriate for diet upgrade at this time due to oral phase impairments. Pt brought back to room and family encouraged to have patient verbalize simple responses to yes/no questions and practice names of family members. Pt left in wheelchair with alarm belt intact and all needs met, cont ST POC.   Pain Pain Assessment Pain Scale: 0-10 Pain Score: 0-No pain  Therapy/Group: Individual Therapy  Jenna R Stewart 08/04/2022, 2:04 PM 

## 2022-08-04 NOTE — Progress Notes (Signed)
Physical Therapy Session Note  Patient Details  Name: Kara Bailey MRN: [id number] Date of Birth: 1956-08-06  Today's Date: 08/04/2022 PT Individual Time: 0916-1016 PT Individual Time Calculation (min): 60 min   Short Term Goals: Week 1:  PT Short Term Goal 1 (Week 1): Pt will perform bed mobility with max assist of 1 consistently PT Short Term Goal 2 (Week 1): Pt will sit EOB with CGA >3 mintues PT Short Term Goal 3 (Week 1): Pt will propell WC >100with min assist PT Short Term Goal 4 (Week 1): Pt will tolerate standing >1 min with max assist from PT   Skilled Therapeutic Interventions/Progress Updates:  Patient supine in bed on entrance to room. Patient alert and agreeable to PT session. Points at foot and is excited to show that she is moving her LLE (trace to 2-/5 activation at hip to ER foot).  Patient with no pain complaint at start of session.  Therapeutic Activity: Bed Mobility: Pt performed supine <> sit to R side with minA for LLE and Min/ ModA to complete scoot to EOB. Pt requests to change clothes. Brief and pants donned using STEDY with MaxA. Pt doffs old shirt with Mod A and dons fresh shirt with MaxA. VC/ tc required for required technique throughout. Pt is very motivated to complete with improved independence. Transfers: Pt performed sit<>stand in STEDY this session and is able to stand with ModA for power up from lower w/c seat and CGA from perch. Provided verbal cues for positioning and technique.  Wheelchair Mobility:  Pt propelled wheelchair 45 feet with max instructions and vc/ tc for hemi technique. Requires CGA/ MinA initially and is able to finally maintain straight-ish path with increased RLE use.   Neuromuscular Re-ed: NMR facilitated during session with focus on standing balance, midline positioning, increased LLE muscle activation. Pt guided in sit<>stand in STEDY with PT-facilitated lean to L side. Definite need for heavy modA to complete. While standing in  STEDY, use of mirror in room to find midline with good results in sitting and difficulty to remain more upright with body extension in standing. NMR performed for improvements in motor control and coordination, balance, sequencing, judgement, and self confidence/ efficacy in performing all aspects of mobility at highest level of independence.   Patient seated upright in w/c at end of session with brakes locked, belt alarm set, and all needs within reach.  Therapy Documentation Precautions:  Precautions Precautions: Fall Precaution Comments: L hemi. expressive aphasia Restrictions Weight Bearing Restrictions: No General:   Vital Signs: Therapy Vitals BP: (!) 152/76 Pain:     Therapy/Group: Individual Therapy  Alger Simons PT, DPT, CSRS 08/04/2022, 10:18 AM

## 2022-08-04 NOTE — Progress Notes (Signed)
Occupational Therapy Session Note  Patient Details  Name: Kara Bailey MRN: [id number] Date of Birth: June 18, 1956  Today's Date: 08/04/2022 OT Individual Time: 1131-1227 OT Individual Time Calculation (min): 56 min    Short Term Goals: Week 1:  OT Short Term Goal 1 (Week 1): Pt will be able to complete a sq pivot toilet transfer with max A of 1. OT Short Term Goal 2 (Week 1): Pt will be able to rise to stand and hold grab bar with R hand with mod A of 1. OT Short Term Goal 3 (Week 1): Pt will hold static stand with RUE support during LB self care. OT Short Term Goal 4 (Week 1): Pt will don a pullover shirt with min A OT Short Term Goal 5 (Week 1): Pt will demonstrate self management of L arm with positioning with min cues.  Skilled Therapeutic Interventions/Progress Updates:  Pt awake up in w/c upon OT arrival to the room. Pt tearful upon OT arrival and writes on whiteboard, "I want to go home." Pt in agreement for OT session.  Therapy Documentation Precautions:  Precautions Precautions: Fall Precaution Comments: L hemi. expressive aphasia Restrictions Weight Bearing Restrictions: No Vital Signs: Please see "Flowsheet" for most recent vitals charted by nursing staff.  Pain: Pain Assessment Pain Scale: 0-10 Pain Score: 0-No pain  ADL: Pt declines need to perform ADL's at this time. Pt able to write on whiteboard that NT assisted with changing brief just prior to OT session.   LUE Neuro Re-Education: Pt participates LUE neuro re-education in order to improve motor control on LUE which is needed for increased independence with ADLs/mobility. Pt tolerates positioning LUE in SaeboMAS. OT provides PROM using Sa[place]o MAS to ensure pt's understanding of movements that are going to be completed. Mirror placed in front of pt for visual biofeedback to improve neuro re-education principles. Pt able to perform 15 reps x 2 of the following movements in a gravity eliminated position while  seated in the w/c: shoulder horizontal abd/ad and elbow flex ext. No muscle activation noted with both movements in Saebo MAS. Therefore, vibration was incorporated for increased proprioception to the muscle belly with verbal prompts to perform proper muscle activation with pt continuing to present with no muscle activation. Pt reports no discomfort from vibration. Pt sustains good visual attention to mirror throughout task.    Self-ROM: OT provides education on self-ROM techniques for LUE for pt to complete outside of therapy sessions. OT provides education on importance of performing L shoulder self-ROM <90 degrees to maintain joint integrity. Skilled demo provided for shoulder flex, elbow flex/ext, forearm pro/sup, wrist flex/ext, and composite digital flex/ext with pt able to return demo to ensure understanding. Pt able to perform ~5 reps of each exercise after skilled demo is provided.   Sleep Hygiene: Pt reports not sleeping well and experiencing fatigue during the day. OT provides education on importance of sleep to aid in recovery and sleep hygiene techniques to improve sleeping such as making the room dark when going to bed, limiting blue light ~30 minutes prior to bed, and establishing a routine. Pt verbalizes understanding.   Pt requested to stay in the w/c at end of session. Pt left sitting comfortably in the w/c with personal belongings and call light within reach, belt alarm placed and activated, pt's cousin present in room, and comfort needs attended to.   Therapy/Group: Individual Therapy  Barbee Shropshire 08/04/2022, 12:34 PM

## 2022-08-04 NOTE — Progress Notes (Signed)
Physical Therapy Session Note  Patient Details  Name: Kara Bailey MRN: [id number] Date of Birth: October 14, 1956  Today's Date: 08/04/2022 PT Individual Time: 1445-1530 PT Individual Time Calculation (min): 45 min   Short Term Goals: Week 1:  PT Short Term Goal 1 (Week 1): Pt will perform bed mobility with max assist of 1 consistently PT Short Term Goal 2 (Week 1): Pt will sit EOB with CGA >3 mintues PT Short Term Goal 3 (Week 1): Pt will propell WC >100with min assist PT Short Term Goal 4 (Week 1): Pt will tolerate standing >1 min with max assist from PT  Skilled Therapeutic Interventions/Progress Updates:  Pt transported to rehab gym. Pt performed exercise seated in w/c, AAROM - AROM R LE and PROM L LE, 3 sets x 10 reps each, heel slides and LAQs. PT stood x6 in parallel bars with mod A and verbal cues, while standing focused on posture and upright positioning. Pt returned to room. Pt transferred w/c to edge of bed with max A and verbal cues. Pt transferred edge of bed to supine with total A. Pt left sitting up in bed with all needs within reach and bed alarm on.   Therapy Documentation Precautions:  Precautions Precautions: Fall Precaution Comments: L hemi. expressive aphasia Restrictions Weight Bearing Restrictions: No General:   Pain: Pain Assessment Pain Scale: 0-10 Pain Score: 0-No pain    Therapy/Group: Individual Therapy  [name] 08/04/2022, 3:43 PM

## 2022-08-04 NOTE — Progress Notes (Signed)
PROGRESS NOTE   Subjective/Complaints:  Pt up in bed trying to eat breakfast. Doesn't like food. Frowning at it.     ROS: limited due to language/communication   Objective:   VAS [place] LOWER EXTREMITY VENOUS (DVT)  Result Date: 08/03/2022  Lower Venous DVT Study Patient Name:  Kara Bailey  Date of Exam:   08/02/2022 Medical Rec #: [id number]         Accession #:    [id number] Date of Birth: June 17, 1956          Patient Gender: F Patient Age:   66 years Exam Location:  [facility] Procedure:      VAS [place] LOWER EXTREMITY VENOUS (DVT) Referring Phys: Risa Grill --------------------------------------------------------------------------------  Indications: Edema.  Comparison Study: no prior Performing Technologist: Archie Patten RVS  Examination Guidelines: A complete evaluation includes B-mode imaging, spectral Doppler, color Doppler, and power Doppler as needed of all accessible portions of each vessel. Bilateral testing is considered an integral part of a complete examination. Limited examinations for reoccurring indications may be performed as noted. The reflux portion of the exam is performed with the patient in reverse Trendelenburg.  +-----+---------------+---------+-----------+----------+--------------+ RIGHTCompressibilityPhasicitySpontaneityPropertiesThrombus Aging +-----+---------------+---------+-----------+----------+--------------+ CFV  Full           Yes      Yes                                 +-----+---------------+---------+-----------+----------+--------------+   +--------+---------------+---------+-----------+----------+--------------------+ LEFT    CompressibilityPhasicitySpontaneityPropertiesThrombus Aging       +--------+---------------+---------+-----------+----------+--------------------+ CFV     Full           Yes      Yes                                        +--------+---------------+---------+-----------+----------+--------------------+ SFJ     Full                                                              +--------+---------------+---------+-----------+----------+--------------------+ FV Prox Full                                                              +--------+---------------+---------+-----------+----------+--------------------+ FV Mid                 Yes      Yes                  patent by color  doppler              +--------+---------------+---------+-----------+----------+--------------------+ FV                     Yes      Yes                  patent by color      Distal                                               doppler              +--------+---------------+---------+-----------+----------+--------------------+ PFV     Full                                                              +--------+---------------+---------+-----------+----------+--------------------+ POP     Full           Yes      Yes                                       +--------+---------------+---------+-----------+----------+--------------------+ PTV     Full                                                              +--------+---------------+---------+-----------+----------+--------------------+ PERO    Full                                                              +--------+---------------+---------+-----------+----------+--------------------+    Summary: RIGHT: - No evidence of common femoral vein obstruction.  LEFT: - There is no evidence of deep vein thrombosis in the lower extremity.  - No cystic structure found in the popliteal fossa.  *See table(s) above for measurements and observations. Electronically signed by Jamelle Haring on 08/03/2022 at 10:13:51 PM.    Final    Recent Labs    08/02/22 0715  WBC 11.2*  HGB 14.2  HCT 43.6  PLT  268   Recent Labs    08/02/22 0715  NA 142  K 4.0  CL 110  CO2 24  GLUCOSE 108*  BUN 19  CREATININE 0.99  CALCIUM 9.7    Intake/Output Summary (Last 24 hours) at 08/04/2022 1044 Last data filed at 08/04/2022 1010 Gross per 24 hour  Intake 340 ml  Output --  Net 340 ml        Physical Exam: Vital Signs Blood pressure (!) 152/76, pulse 93, temperature 98.5 F (36.9 C), temperature source Oral, resp. rate 17, weight 59 kg, SpO2 100 %.   Constitutional: No distress . Vital signs reviewed. HEENT: NCAT, EOMI, oral membranes moist Neck: supple Cardiovascular: RRR without murmur. No JVD  Respiratory/Chest: CTA Bilaterally without wheezes or rales. Normal effort    GI/Abdomen: BS +, non-tender, non-distended Ext: no clubbing, cyanosis, or edema Psych: pleasant and cooperative  Skin: No evidence of breakdown, no evidence of rash Neurologic: pt did not offer anything verbally to me but seemed to comprehend what I said responding with y/n head nods. Cranial nerves II through XII intact, motor strength is 5/5 in right , 0/5 left deltoid, bicep, tricep, grip, hip flexor, knee extensors, ankle dorsiflexor and plantar flexor Sensory exam +/- Musculoskeletal: Full range of motion in all 4 extremities. No joint swelling   Assessment/Plan: 1. Functional deficits which require 3+ hours per day of interdisciplinary therapy in a comprehensive inpatient rehab setting. Physiatrist is providing close team supervision and 24 hour management of active medical problems listed below. Physiatrist and rehab team continue to assess barriers to discharge/monitor patient progress toward functional and medical goals  Care Tool:  Bathing    Body parts bathed by patient: Chest, Abdomen, Right upper leg, Left upper leg, Face, Left arm   Body parts bathed by helper: Right arm, Front perineal area, Buttocks, Right lower leg, Left lower leg     Bathing assist Assist Level: Moderate Assistance -  Patient 50 - 74%     Upper Body Dressing/Undressing Upper body dressing   What is the patient wearing?: Bra, Pull over shirt    Upper body assist Assist Level: Moderate Assistance - Patient 50 - 74%    Lower Body Dressing/Undressing Lower body dressing      What is the patient wearing?: Underwear/pull up, Pants     Lower body assist Assist for lower body dressing: Maximal Assistance - Patient 25 - 49%     Toileting Toileting    Toileting assist Assist for toileting: Minimal Assistance - Patient > 75%     Transfers Chair/bed transfer  Transfers assist  Chair/bed transfer activity did not occur: Safety/medical concerns  Chair/bed transfer assist level: Maximal Assistance - Patient 25 - 49%     Locomotion Ambulation   Ambulation assist   Ambulation activity did not occur: Safety/medical concerns          Walk 10 feet activity   Assist  Walk 10 feet activity did not occur: Safety/medical concerns        Walk 50 feet activity   Assist Walk 50 feet with 2 turns activity did not occur: Safety/medical concerns         Walk 150 feet activity   Assist Walk 150 feet activity did not occur: Safety/medical concerns         Walk 10 feet on uneven surface  activity   Assist Walk 10 feet on uneven surfaces activity did not occur: Safety/medical concerns         Wheelchair     Assist   Type of Wheelchair: Manual    Wheelchair assist level: Dependent - Patient 0% Max wheelchair distance: 150    Wheelchair 50 feet with 2 turns activity    Assist        Assist Level: Dependent - Patient 0%   Wheelchair 150 feet activity     Assist      Assist Level: Dependent - Patient 0%   Blood pressure (!) 152/76, pulse 93, temperature 98.5 F (36.9 C), temperature source Oral, resp. rate 17, weight 59 kg, SpO2 100 %.  Medical Problem List and Plan: 1. 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  Antithrombotics: -DVT/anticoagulation:  Pharmaceutical: Lovenox doppler without evidence of DVT              -antiplatelet therapy: continue Plavix and aspirin as PTA 3. Pain Management: tylenol as needed 4. Mood/Behavior/Sleep: LCSW to evaluate and provide emotional support             -antipsychotic agents: n/a             -bupropion ER 150 mg q AM             -mirtazapine 30 mg q HS             -alprazolam 0.5 mg daily prn anxiety 5. Neuropsych/cognition: This patient is capable of making decisions on her own behalf. 6. Skin/Wound Care: routine skin care checks 7. Fluids/Electrolytes/Nutrition: routine Is and Os and follow-up chemistries             Dysphagia 1 diet/thin-SLP eval--pt unhappy with diet.   -explained the importance of her nutrition 8: Left-sided weakness/decreased sensation: check LLE venous duplex 9: Hypertension: continue to monitor;  avoid hypotension             -continue Cardizem 180 mg daily  -mild elevation--monitor for consistent pattern 10: Hyperlipidemia: LDL = 125, counsel regarding healthy diet             -continue  ezetimibe 10 mg daily             -continue atorvastatin 90 mg daily 11: Tobacco use: cessation counseling             -continue nicotine patch 21 mg 12. Constipation             -Order sorbitol  -small,hard bm 9/22 13. GERD             -Continue PPI    LOS: 3 days A FACE TO FACE EVALUATION WAS PERFORMED  Meredith Staggers 08/04/2022, 10:44 AM

## 2022-08-05 NOTE — Progress Notes (Signed)
Informed by NT patient refused bath tonight. Patient alert and oriented- able to use written communication effectively. I reminded patient again about her family's request for her to have a bath tonight but again patient wrote on notebook "not tonight". Call bell and belongings left within reach.

## 2022-08-06 DIAGNOSIS — I639 Cerebral infarction, unspecified: Secondary | ICD-10-CM | POA: Diagnosis not present

## 2022-08-06 LAB — CBC
HCT: 38.6 % (ref 36.0–46.0)
Hemoglobin: 12.8 g/dL (ref 12.0–15.0)
MCH: 30.3 pg (ref 26.0–34.0)
MCHC: 33.2 g/dL (ref 30.0–36.0)
MCV: 91.3 fL (ref 80.0–100.0)
Platelets: 273 10*3/uL (ref 150–400)
RBC: 4.23 MIL/uL (ref 3.87–5.11)
RDW: 14 % (ref 11.5–15.5)
WBC: 10.2 10*3/uL (ref 4.0–10.5)
nRBC: 0 % (ref 0.0–0.2)

## 2022-08-06 LAB — BASIC METABOLIC PANEL
Anion gap: 11 (ref 5–15)
BUN: 17 mg/dL (ref 8–23)
CO2: 27 mmol/L (ref 22–32)
Calcium: 10.1 mg/dL (ref 8.9–10.3)
Chloride: 106 mmol/L (ref 98–111)
Creatinine, Ser: 0.89 mg/dL (ref 0.44–1.00)
GFR, Estimated: >60 mL/min (ref >60)
Glucose, Bld: 105 mg/dL — ABNORMAL HIGH (ref 70–99)
Potassium: 3.5 mmol/L (ref 3.5–5.1)
Sodium: 144 mmol/L (ref 135–145)

## 2022-08-06 NOTE — Progress Notes (Signed)
Speech Language Pathology Daily Session Note  Patient Details  Name: Kara Bailey MRN: [id number] Date of Birth: [birth year]  Today's Date: 08/06/2022 SLP Individual Time: 0902-1000 SLP Individual Time Calculation (min): 58 min  Short Term Goals: Week 1: SLP Short Term Goal 1 (Week 1): Patient will communicate functional wants/needs/wishes/feelings through multimodal communication and min A verbal/visual cues for effectiveness SLP Short Term Goal 2 (Week 1): Patient will approximate production of single syllable words with max A verbal/visual cues to achieve 50% intelligibility SLP Short Term Goal 3 (Week 1): Patient will sustain vowel sound for up 5 seconds during 3 of 5 occasions with max A cues SLP Short Term Goal 4 (Week 1): Patient will utilize low tech communcation supports Passenger transport manager) and/or written communication to convey message with min A cues for effectiveness SLP Short Term Goal 5 (Week 1): Pt will consume dysphagia 1 textures with thin liquids with improved awareness of bolus/spillage, oral residuals, and without overt s/sx of aspiration with mod A to implement swallowing precautions and strategies  Skilled Therapeutic Interventions: Skilled ST treatment focused on speech and swallowing goals. Pt received upright in wheelchair on arrival, consuming thin liquids by cup with minimal anterior spillage, and without overt s/sx concerning for aspiration.   SLP facilitated session by providing max A verbal/visual/tactile cues for implementation of diaphragmatic breathing exercises. Pt exhibited what was perceived as tense inhale/exhale and voicing attempts. SLP provided instruction on tension relief techniques through relaxing shoulders & jaw, and easy onset with overall mod A with /h/ initial, monosyllabic words. Pt exhibited carry over of easy onset and diaphragmatic breath support at the word level with overall mod A verbal/visual cues. Speech was perceived as <25% intelligible  during production of functional, monosyllabic words using stimuli from low tech communication support with varying initial consonants. Pt with particular difficulty eliciting /th/ phoneme. Pt sustained vowel "ah" for average of 2 seconds with max A multimodal cues. 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   SLP provided instruction to achieve further practice throughout the day with diaphragmatic breath support, inhalation/exhalation with reduced tension, and easy onset. Pt returned demonstration with sup A verbal cues.   Patient was left in wheelchair with alarm activated and immediate needs within reach at end of session. Continue per current plan of care.      Pain  None/denied  Therapy/Group: Individual Therapy  Patty Sermons 08/06/2022, 9:11 AM

## 2022-08-06 NOTE — Progress Notes (Signed)
Occupational Therapy Session Note  Patient Details  Name: Kara Bailey MRN: [id number] Date of Birth: [birth year]  Today's Date: 08/06/2022 OT Individual Time: 0800-0830 OT Individual Time Calculation (min): 30 min    Short Term Goals: Week 1:  OT Short Term Goal 1 (Week 1): Pt will be able to complete a sq pivot toilet transfer with max A of 1. OT Short Term Goal 2 (Week 1): Pt will be able to rise to stand and hold grab bar with R hand with mod A of 1. OT Short Term Goal 3 (Week 1): Pt will hold static stand with RUE support during LB self care. OT Short Term Goal 4 (Week 1): Pt will don a pullover shirt with min A OT Short Term Goal 5 (Week 1): Pt will demonstrate self management of L arm with positioning with min cues.  Skilled Therapeutic Interventions/Progress Updates:  Skilled OT intervention completed with focus on ADL retraining. Pt received upright in bed, no c/o pain, remained aphasic expressively however was able to indicate needs via pointing and was very receptive.  Agreeable to change clothes and get into w/c. Completed doffing/donning of brief and pants with max A at bed level, with pt able to roll > L with min A, > R with heavy min A via multimodal cues for L side management. Overall max A for brief and pants with pt participating on R side using RUE.   Transitioned semi-supine > L sidelying min A, then heavy mod A to power up for trunk elevation to sitting EOB. Pt able to maintain static sitting balance with initial max A fading to CGA while therapist donned socks/shoes with total A. Sit > stand with max A with use of hand over hand for LUE management on stedy grab bar. Required min A for sitting balance 2/2 L lean during dependent transfer in stedy to w/c. Mod A sit > stand from perched position. Min A for descent to low w/c level.  Education provided on doffing/donning UB clothing via hemi-technique. Pt able to doff shirt with min A, doff bra with max A and cues for  technique. Donned deodorant with mod A, with assist for lifting LUE, and hand over hand assist for donning of deo for RUE. Donned new shirt with min A.  Pt remained seated in w/c, with half lap tray applied to LUE for hemi-positioning, with wash cloth placed under L hand to maintain wrist and digit neutral positioning to prevent contractures. Belt alarm donned and all needs in reach at end of session.   Therapy Documentation Precautions:  Precautions Precautions: Fall Precaution Comments: L hemi. expressive aphasia Restrictions Weight Bearing Restrictions: No    Therapy/Group: Individual Therapy  Blase Mess, MS, OTR/L  08/06/2022, 9:34 AM

## 2022-08-06 NOTE — Progress Notes (Signed)
Spoke with patient's son Teyona Nichelson about diet and bringing food from home. Confirmed with Speech and PA that patient can get food from home as long as it is pureed and inspected by staff prior to giving to the patient. Son acknowledged understanding.

## 2022-08-06 NOTE — Progress Notes (Signed)
PROGRESS NOTE   Subjective/Complaints:  Asking will she walk again, discussed timeframe of recovery as well as STG and LTGs Currently standing in parallel bars with Mod A ROS: limited due to language/communication   Objective:   No results found. No results for input(s): "WBC", "HGB", "HCT", "PLT" in the last 72 hours.  No results for input(s): "NA", "K", "CL", "CO2", "GLUCOSE", "BUN", "CREATININE", "CALCIUM" in the last 72 hours.   Intake/Output Summary (Last 24 hours) at 08/06/2022 0928 Last data filed at 08/06/2022 0700 Gross per 24 hour  Intake 290 ml  Output --  Net 290 ml         Physical Exam: Vital Signs Blood pressure 137/80, pulse 97, temperature 98.3 F (36.8 C), temperature source Oral, resp. rate 16, weight 59 kg, SpO2 100 %.    General: No acute distress Mood and affect are appropriate Heart: Regular rate and rhythm no rubs murmurs or extra sounds Lungs: Clear to auscultation, breathing unlabored, no rales or wheezes Abdomen: Positive bowel sounds, soft nontender to palpation, nondistended Extremities: No clubbing, cyanosis, or edema Skin: No evidence of breakdown, no evidence of rash  Neurologic: Intact comprehension, needs to use writing tablet to express herself. Cranial nerves II through XII intact, motor strength is 5/5 in right , 0/5 left deltoid, bicep, tricep, grip, hip flexor, knee extensors, ankle dorsiflexor and plantar flexor Sensory exam +/- Musculoskeletal: Full range of motion in all 4 extremities. No joint swelling   Assessment/Plan: 1. Functional deficits which require 3+ hours per day of interdisciplinary therapy in a comprehensive inpatient rehab setting. Physiatrist is providing close team supervision and 24 hour management of active medical problems listed below. Physiatrist and rehab team continue to assess barriers to discharge/monitor patient progress toward functional and  medical goals  Care Tool:  Bathing    Body parts bathed by patient: Chest, Abdomen, Right upper leg, Left upper leg, Face, Left arm   Body parts bathed by helper: Right arm, Front perineal area, Buttocks, Right lower leg, Left lower leg     Bathing assist Assist Level: Moderate Assistance - Patient 50 - 74%     Upper Body Dressing/Undressing Upper body dressing   What is the patient wearing?: Pull over shirt    Upper body assist Assist Level: Moderate Assistance - Patient 50 - 74%    Lower Body Dressing/Undressing Lower body dressing      What is the patient wearing?: Underwear/pull up, Pants     Lower body assist Assist for lower body dressing: Maximal Assistance - Patient 25 - 49%     Toileting Toileting    Toileting assist Assist for toileting: Moderate Assistance - Patient 50 - 74%     Transfers Chair/bed transfer  Transfers assist  Chair/bed transfer activity did not occur: Safety/medical concerns  Chair/bed transfer assist level: Maximal Assistance - Patient 25 - 49%     Locomotion Ambulation   Ambulation assist   Ambulation activity did not occur: Safety/medical concerns          Walk 10 feet activity   Assist  Walk 10 feet activity did not occur: Safety/medical concerns        Walk 50  feet activity   Assist Walk 50 feet with 2 turns activity did not occur: Safety/medical concerns         Walk 150 feet activity   Assist Walk 150 feet activity did not occur: Safety/medical concerns         Walk 10 feet on uneven surface  activity   Assist Walk 10 feet on uneven surfaces activity did not occur: Safety/medical concerns         Wheelchair     Assist Is the patient using a wheelchair?: Yes Type of Wheelchair: Manual    Wheelchair assist level: Dependent - Patient 0% Max wheelchair distance: 150    Wheelchair 50 feet with 2 turns activity    Assist        Assist Level: Dependent - Patient 0%    Wheelchair 150 feet activity     Assist      Assist Level: Dependent - Patient 0%   Blood pressure 137/80, pulse 97, temperature 98.3 F (36.8 C), temperature source Oral, resp. rate 16, weight 59 kg, SpO2 100 %.  Medical Problem List and Plan: 1. 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  Antithrombotics: -DVT/anticoagulation:  Pharmaceutical: Lovenox doppler without evidence of DVT              -antiplatelet therapy: continue Plavix and aspirin as PTA 3. Pain Management: tylenol as needed 4. Mood/Behavior/Sleep: LCSW to evaluate and provide emotional support             -antipsychotic agents: n/a             -bupropion ER 150 mg q AM             -mirtazapine 30 mg q HS             -alprazolam 0.5 mg daily prn anxiety 5. Neuropsych/cognition: This patient is capable of making decisions on her own behalf. 6. Skin/Wound Care: routine skin care checks 7. Fluids/Electrolytes/Nutrition: routine Is and Os and follow-up chemistries             Dysphagia 1 diet/thin-SLP eval--pt unhappy with diet.   -explained the importance of her nutrition 8: Left-sided weakness/decreased sensation: check LLE venous duplex 9: Hypertension: continue to monitor;  avoid hypotension             -continue Cardizem 180 mg daily   Vitals:   08/05/22 2024 08/06/22 0418  BP: (!) 143/67 137/80  Pulse: (!) 102 97  Resp: 18 16  Temp: 98.6 F (37 C) 98.3 F (36.8 C)  SpO2: 95% 100%    10: Hyperlipidemia: LDL = 125, counsel regarding healthy diet             -continue  ezetimibe 10 mg daily             -continue atorvastatin 90 mg daily 11: Tobacco use: cessation counseling             -continue  nicotine patch 21 mg 12. Constipation             -Order  sorbitol  -small,hard bm 9/22 13. GERD             -Continue PPI    LOS: 5 days A FACE TO FACE EVALUATION WAS PERFORMED  Charlett Blake 08/06/2022, 9:28 AM

## 2022-08-06 NOTE — Progress Notes (Signed)
Occupational Therapy Session Note  Patient Details  Name: Kara Bailey MRN: [id number] Date of Birth: 1956/06/21  Today's Date: 08/06/2022 OT Individual Time: 1500-1530 OT Individual Time Calculation (min): 30 min    Short Term Goals: Week 1:  OT Short Term Goal 1 (Week 1): Pt will be able to complete a sq pivot toilet transfer with max A of 1. OT Short Term Goal 2 (Week 1): Pt will be able to rise to stand and hold grab bar with R hand with mod A of 1. OT Short Term Goal 3 (Week 1): Pt will hold static stand with RUE support during LB self care. OT Short Term Goal 4 (Week 1): Pt will don a pullover shirt with min A OT Short Term Goal 5 (Week 1): Pt will demonstrate self management of L arm with positioning with min cues.  Skilled Therapeutic Interventions/Progress Updates:    Patient agreeable to participate in OT session. Reports 0/10 pain level.   Patient participated in skilled OT session focusing on ADL re-training and NM re-ed to LUE. Therapist assessed carry over of hemi dressing technique education provided during AM session with OT  in order to improve independence with UB dressing. Pt provided with very light min assist to donn new t-shirt using hemi dressing technique (SBA with verbal cues and visual demonstration provided to doff long sleeved shirt). Due to level of difficulty initially with hemi dressing technique, therapist provided set-up of t-shirt with recommendations for sequencing while pt completed activity. Pt completed RUE seated exercises with red resistive band to achieve trunk activation to focus on sitting balance and crossing midline while weightbearing into LUE. Therapist providing Min A to hand to maintain placement on support surface and tactile cues provided at Lt/Rt shoulder to lean forward for an extended reach then returning to start position before next repetition. Completed 10X, 3 trials.   Therapy Documentation Precautions:  Precautions Precautions:  Fall Precaution Comments: L hemi. expressive aphasia Restrictions Weight Bearing Restrictions: No General:   Vital Signs: Therapy Vitals Temp: 98.2 F (36.8 C) Pulse Rate: 96 BP: 100/80 Patient Position (if appropriate): Sitting Oxygen Therapy SpO2: 95 % O2 Device: Room Air   Therapy/Group: Individual Therapy  Ailene Ravel, OTR/L,CBIS  Supplemental OT - [place] and [facility]  08/06/2022, 3:54 PM

## 2022-08-06 NOTE — Discharge Instructions (Addendum)
Inpatient Rehab Discharge Instructions  Kara Bailey Discharge date and time: 09/06/2022  Activities/Precautions/ Functional Status: Activity: no lifting, driving, or strenuous exercise until cleared by MD Diet:  dys phagia 2/ thin liquids; take medication with applesauce Wound Care: none needed Functional status:  ___ No restrictions     ___ Walk up steps independently __x_ 24/7 supervision/assistance   ___ Walk up steps with assistance ___ Intermittent supervision/assistance  ___ Bathe/dress independently ___ Walk with walker     ___ Bathe/dress with assistance ___ Walk Independently    ___ Shower independently ___ Walk with assistance    __x_ Shower with assistance __x_ No alcohol     ___ Return to work/school ________  Special Instructions: No driving, alcohol consumption or tobacco use.    COMMUNITY REFERRALS UPON DISCHARGE:    Home Health:   PT     OT     ST                       Agency: [facility] Phone: [phone]     Medical Equipment/Items Ordered: Hospital Bed, Hemi Walker, Drop Arm Commode                                                 Agency/Supplier: [employer] [phone]    Special Instructions: No driving, alcohol consumption or tobacco use.  Patient SHOULD NOT SHOWER until [facility] begin. Patient should complete sink baths. Occupational Therapy has included demonstration videos on patient's cell phone for home safety.    STROKE/TIA DISCHARGE INSTRUCTIONS SMOKING Cigarette smoking nearly doubles your risk of having a stroke & is the single most alterable risk factor  If you smoke or have smoked in the last 12 months, you are advised to quit smoking for your health. Most of the excess cardiovascular risk related to smoking disappears within a year of stopping. Ask you doctor about anti-smoking medications [place] Quit Line: 1-800-QUIT NOW Free Smoking Cessation Classes [phone]  CHOLESTEROL Know your levels; limit fat & cholesterol in your diet   Lipid Panel     Component Value Date/Time   CHOL 185 07/26/2022 0909   CHOL 156 08/14/2017 1610   TRIG 97 07/26/2022 0909   HDL 41 07/26/2022 0909   HDL 39 (L) 08/14/2017 1610   CHOLHDL 4.5 07/26/2022 0909   VLDL 19 07/26/2022 0909   LDLCALC 125 (H) 07/26/2022 0909   LDLCALC 95 08/14/2017 1610     Many patients benefit from treatment even if their cholesterol is at goal. Goal: Total Cholesterol (CHOL) less than 160 Goal:  Triglycerides (TRIG) less than 150 Goal:  HDL greater than 40 Goal:  LDL (LDLCALC) less than 100   BLOOD PRESSURE American Stroke Association blood pressure target is less that 120/80 mm/Hg  Your discharge blood pressure is:  BP: 132/76 Monitor your blood pressure Limit your salt and alcohol intake Many individuals will require more than one medication for high blood pressure  DIABETES (A1c is a blood sugar average for last 3 months) Goal HGBA1c is under 7% (HBGA1c is blood sugar average for last 3 months)  Diabetes: No known diagnosis of diabetes    Lab Results  Component Value Date   HGBA1C 5.4 07/26/2022    Your HGBA1c can be lowered with medications, healthy diet, and exercise. Check your blood sugar as directed by  your physician Call your physician if you experience unexplained or low blood sugars.  PHYSICAL ACTIVITY/REHABILITATION Goal is 30 minutes at least 4 days per week  Activity: Increase activity slowly, Therapies: Physical Therapy: Home Health, Occupational Therapy: Home Health, and Speech Therapy: Home Health Return to work: when cleared by MD Activity decreases your risk of heart attack and stroke and makes your heart stronger.  It helps control your weight and blood pressure; helps you relax and can improve your mood. Participate in a regular exercise program. Talk with your doctor about the best form of exercise for you (dancing, walking, swimming, cycling).  DIET/WEIGHT Goal is to maintain a healthy weight  Your discharge diet is:  Diet  Order             DIET DYS 2 Room service appropriate? Yes; Fluid consistency: Thin  Diet effective now                  thin liquids Your height is:  Height: 5' 5.98" (167.6 cm) Your current weight is: Weight: 57 kg Your Body Mass Index (BMI) is:  BMI (Calculated): 20.29 Following the type of diet specifically designed for you will help prevent another stroke. Your goal weight range is:   Your goal Body Mass Index (BMI) is 19-24. Healthy food habits can help reduce 3 risk factors for stroke:  High cholesterol, hypertension, and excess weight.  RESOURCES Stroke/Support Group:  Call [phone]   STROKE EDUCATION PROVIDED/REVIEWED AND GIVEN TO PATIENT Stroke warning signs and symptoms How to activate emergency medical system (call 911). Medications prescribed at discharge. Need for follow-up after discharge. Personal risk factors for stroke. Pneumonia vaccine given: No Flu vaccine given: No My questions have been answered, the writing is legible, and I understand these instructions.  I will adhere to these goals & educational materials that have been provided to me after my discharge from the hospital.      My questions have been answered and I understand these instructions. I will adhere to these goals and the provided educational materials after my discharge from the hospital.  Patient/Caregiver Signature _______________________________ Date __________  Clinician Signature _______________________________________ Date __________  Please bring this form and your medication list with you to all your follow-up doctor's appointments.

## 2022-08-06 NOTE — Progress Notes (Signed)
Physical Therapy Session Note  Patient Details  Name: Kara Bailey MRN: [id number] Date of Birth: 11-May-1956  Today's Date: 08/06/2022 PT Individual Time: 1300-1345 PT Individual Time Calculation (min): 45 min   Short Term Goals: Week 1:  PT Short Term Goal 1 (Week 1): Pt will perform bed mobility with max assist of 1 consistently PT Short Term Goal 2 (Week 1): Pt will sit EOB with CGA >3 mintues PT Short Term Goal 3 (Week 1): Pt will propell WC >100with min assist PT Short Term Goal 4 (Week 1): Pt will tolerate standing >1 min with max assist from PT  Skilled Therapeutic Interventions/Progress Updates:    Session focused on NMR to address postural control retraining (seated and standing), sit <> stands, functional standing balance and tolerance, and forced weightbearing through L hemibody in standing position. Pt transported to and from therapy gym via w/c with total assist for time management Pt utilized communication board (writing) throughout session successfully.   Utilized standing frame to address the above deficits with focus on ability to engage in more dynamic activity and longer tolerance in standing position. Pt able to perform about 10 min first trial and due to time constraints limited for second trial about 3 min. Requires max assist to manage LUE during transition from sit <> stand and cues  for body positining towards midline as well as manual facilitation. In standing focus on achieving midlines, activation through trunk and BLE extension, and decreasing reliance on RUE to pull her self forward and activate through trunk- decreased tension on sling to allow for more active range and activation. Progressed to dynamic reaching task with RUE (PT provided input for LUE positioning/alignment for proper weightbearing) to grab for squigz suction cups and then place on window in front of patient at various heights (increasing height to promote full upright posture) x 2 complete sets  placing on and off each time - facilitation provided at hips and verbal cues given.   Pt emotional throughout session often crying and expressing via communication board concern with being able to stand again. Discussed and educated on overall stroke recovery, motivation, and goals of therapies. Emotional support and encouragement provided throughout and pt very receptive.   Therapy Documentation Precautions:  Precautions Precautions: Fall Precaution Comments: L hemi. expressive aphasia Restrictions Weight Bearing Restrictions: No  Pain:  No complaints of pain.     Therapy/Group: Individual Therapy  Canary Brim Ivory Broad, PT, DPT, CBIS  08/06/2022, 2:52 PM

## 2022-08-06 NOTE — Progress Notes (Signed)
Physical Therapy Session Note  Patient Details  Name: Kara Bailey MRN: [id number] Date of Birth: [birth year]  Today's Date: 08/06/2022 PT Individual Time: 9892-1194 PT Individual Time Calculation (min): 43 min   Short Term Goals: Week 1:  PT Short Term Goal 1 (Week 1): Pt will perform bed mobility with max assist of 1 consistently PT Short Term Goal 2 (Week 1): Pt will sit EOB with CGA >3 mintues PT Short Term Goal 3 (Week 1): Pt will propell WC >100with min assist PT Short Term Goal 4 (Week 1): Pt will tolerate standing >1 min with max assist from PT   Skilled Therapeutic Interventions/Progress Updates:  Patient seated upright in w/c on entrance to room. Patient alert and agreeable to PT session.   Patient with no pain complaint at start of session.  Therapeutic Activity: Transfers: Pt performed squat pivot transfers to R side throughout session with MaxA. Provided verbal cues for hand positioning, forward lean, swivel of hips, and general technique. Sit<>stand transfers  from mat table to hand hold with MaxA and in // bars with Mod/MaxA.   Neuromuscular Re-ed: NMR facilitated during session with focus on seated and standing balance, proprioception, midline orientation, muscle fiber activation. Pt guided in improved technique for sit<>stand including forward lean from hips in order to start to clear seat. Blocked practice  with minisquat performance x4 prior to fatigue.   Seated balance challenged with reaches of Bil elbow to mattable and return to midline. Gentle guard provided for lean to L and assist provided to elbow, but pt is able to engage core and return to upright sitting. Guided in rotational reach for RUE. Adapted crunches with pt able to sit back to handheld target and return to upright sitting with no physical assist. Progressed distance back with each rep.   Standing in // bars while guiding pt in significant lateral weight shift followed by minisquats x10. NMR performed  for improvements in motor control and coordination, balance, sequencing, judgement, and self confidence/ efficacy in performing all aspects of mobility at highest level of independence.   Patient seated upright in w/c at end of session with brakes locked, belt alarm set, and all needs within reach. Pt oriented to time and time of next therapy session.    Therapy Documentation Precautions:  Precautions Precautions: Fall Precaution Comments: L hemi. expressive aphasia Restrictions Weight Bearing Restrictions: No General:   Vital Signs:   Pain: Pain Assessment Pain Scale: 0-10 Pain Score: 0-No pain   Therapy/Group: Individual Therapy  Alger Simons PT, DPT, CSRS 08/06/2022, 12:46 PM

## 2022-08-07 DIAGNOSIS — I639 Cerebral infarction, unspecified: Secondary | ICD-10-CM | POA: Diagnosis not present

## 2022-08-07 NOTE — Progress Notes (Addendum)
PROGRESS NOTE   Subjective/Complaints:  No issues overnite, states she had the wrong breakfast tray and ate ~1/2 meal, working with OT  Spoke to son on phone today , discussed prognosis for return on Left side discussed that I would not expect normal use of the left side in future but she is in the timeframe of partial recovery ROS: limited due to language/communication   Objective:   No results found. Recent Labs    08/06/22 0901  WBC 10.2  HGB 12.8  HCT 38.6  PLT 273    Recent Labs    08/06/22 0901  NA 144  K 3.5  CL 106  CO2 27  GLUCOSE 105*  BUN 17  CREATININE 0.89  CALCIUM 10.1     Intake/Output Summary (Last 24 hours) at 08/07/2022 0959 Last data filed at 08/06/2022 1300 Gross per 24 hour  Intake 20 ml  Output --  Net 20 ml         Physical Exam: Vital Signs Blood pressure (!) 133/94, pulse 99, temperature 97.6 F (36.4 C), temperature source Oral, resp. rate 18, weight 59 kg, SpO2 100 %.    General: No acute distress Mood and affect are appropriate Heart: Regular rate and rhythm no rubs murmurs or extra sounds Lungs: Clear to auscultation, breathing unlabored, no rales or wheezes Abdomen: Positive bowel sounds, soft nontender to palpation, nondistended Extremities: No clubbing, cyanosis, or edema Skin: No evidence of breakdown, no evidence of rash  Neurologic: Intact comprehension, needs to use writing tablet to express herself. Cranial nerves II through XII intact, motor strength is 5/5 in right , 0/5 left deltoid, bicep, tricep, grip, hip flexor, knee extensors, ankle dorsiflexor and plantar flexor Sensory exam +/- Musculoskeletal: Full range of motion in all 4 extremities. No joint swelling   Assessment/Plan: 1. Functional deficits which require 3+ hours per day of interdisciplinary therapy in a comprehensive inpatient rehab setting. Physiatrist is providing close team supervision and  24 hour management of active medical problems listed below. Physiatrist and rehab team continue to assess barriers to discharge/monitor patient progress toward functional and medical goals  Care Tool:  Bathing    Body parts bathed by patient: Chest, Abdomen, Right upper leg, Left upper leg, Face, Left arm   Body parts bathed by helper: Right arm, Front perineal area, Buttocks, Right lower leg, Left lower leg     Bathing assist Assist Level: Moderate Assistance - Patient 50 - 74%     Upper Body Dressing/Undressing Upper body dressing   What is the patient wearing?: Pull over shirt    Upper body assist Assist Level: Moderate Assistance - Patient 50 - 74%    Lower Body Dressing/Undressing Lower body dressing      What is the patient wearing?: Underwear/pull up, Pants     Lower body assist Assist for lower body dressing: Maximal Assistance - Patient 25 - 49%     Toileting Toileting    Toileting assist Assist for toileting: Maximal Assistance - Patient 25 - 49%     Transfers Chair/bed transfer  Transfers assist  Chair/bed transfer activity did not occur: Safety/medical concerns  Chair/bed transfer assist level: Maximal Assistance - Patient  25 - 49%     Locomotion Ambulation   Ambulation assist   Ambulation activity did not occur: Safety/medical concerns          Walk 10 feet activity   Assist  Walk 10 feet activity did not occur: Safety/medical concerns        Walk 50 feet activity   Assist Walk 50 feet with 2 turns activity did not occur: Safety/medical concerns         Walk 150 feet activity   Assist Walk 150 feet activity did not occur: Safety/medical concerns         Walk 10 feet on uneven surface  activity   Assist Walk 10 feet on uneven surfaces activity did not occur: Safety/medical concerns         Wheelchair     Assist Is the patient using a wheelchair?: Yes Type of Wheelchair: Manual    Wheelchair assist  level: Dependent - Patient 0% Max wheelchair distance: 150    Wheelchair 50 feet with 2 turns activity    Assist        Assist Level: Dependent - Patient 0%   Wheelchair 150 feet activity     Assist      Assist Level: Dependent - Patient 0%   Blood pressure (!) 133/94, pulse 99, temperature 97.6 F (36.4 C), temperature source Oral, resp. rate 18, weight 59 kg, SpO2 100 %.  Medical Problem List and Plan: 1. 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  Antithrombotics: -DVT/anticoagulation:  Pharmaceutical: Lovenox doppler without evidence of DVT              -antiplatelet therapy: continue Plavix and aspirin as PTA 3. Pain Management: tylenol as needed 4. Mood/Behavior/Sleep: LCSW to evaluate and provide emotional support             -antipsychotic agents: n/a             -bupropion ER 150 mg q AM             -mirtazapine 30 mg q HS             -alprazolam 0.5 mg daily prn anxiety 5. Neuropsych/cognition: This patient is capable of making decisions on her own behalf. 6. Skin/Wound Care: routine skin care checks 7. Fluids/Electrolytes/Nutrition: routine Is and Os and follow-up chemistries             Dysphagia 1 diet/thin-SLP eval--pt unhappy with diet.   -explained the importance of her nutrition 8: Left-sided weakness/decreased sensation: check LLE venous duplex 9: Hypertension: continue to monitor;  avoid hypotension             -continue Cardizem 180 mg daily   Vitals:   08/07/22 0515 08/07/22 0935  BP: 132/73 (!) 133/94  Pulse: 92 99  Resp: 20 18  Temp: 98.4 F (36.9 C) 97.6 F (36.4 C)  SpO2: 100% 100%    10: Hyperlipidemia: LDL = 125, counsel  regarding healthy diet             -continue  ezetimibe  10 mg daily             -continue atorvastatin 90 mg daily 11: Tobacco use: cessation counseling             -continue nicotine patch 21 mg 12. Constipation             -Order sorbitol  -small,hard bm 9/22 13. GERD             -Continue PPI    LOS: 6 days A FACE TO FACE EVALUATION WAS PERFORMED  Kara Bailey 08/07/2022, 9:59 AM

## 2022-08-07 NOTE — Progress Notes (Signed)
Occupational Therapy Session Note  Patient Details  Name: Kara Bailey MRN: [id number] Date of Birth: Mar 13, 1956  Today's Date: 08/07/2022 OT Individual Time: 2836-6294 OT Individual Time Calculation (min): 45 min    Short Term Goals: Week 1:  OT Short Term Goal 1 (Week 1): Pt will be able to complete a sq pivot toilet transfer with max A of 1. OT Short Term Goal 2 (Week 1): Pt will be able to rise to stand and hold grab bar with R hand with mod A of 1. OT Short Term Goal 3 (Week 1): Pt will hold static stand with RUE support during LB self care. OT Short Term Goal 4 (Week 1): Pt will don a pullover shirt with min A OT Short Term Goal 5 (Week 1): Pt will demonstrate self management of L arm with positioning with min cues.  Skilled Therapeutic Interventions/Progress Updates:  Pt awake in bed upon OT arrival to the room. Pt able to point to where clothing is located to initiate ADLs. Pt in agreement for OT session.   Therapy Documentation Precautions:  Precautions Precautions: Fall Precaution Comments: L hemi. expressive aphasia Restrictions Weight Bearing Restrictions: No Vital Signs: Please see "Flowsheet" for most recent vitals charted by nursing staff.  Pain: Pain Assessment Pain Scale: 0-10 Pain Score: 0-No pain  ADL: Grooming: Contact guard (Pt requires education on hemi-techniques using LUE as a stabilizer to manage ADL tools. Pt able to perform oral care and facial hygiene sitting at the sink in the w/c with CGA to utilize hemi-techniquew using LUE as stabilizer to manage toothpaste.) Where Assessed-Grooming: Sitting at sink, Wheelchair Upper Body Bathing: Minimal assistance (Pt able to bathe 4/5 UB parts while completing a sponge bath at bed level. Pt requires assistance to bathe RUE d/t LUE motor control deficits.) Where Assessed-Upper Body Bathing: Bed level Lower Body Bathing: Minimal assistance (Pt able to bathe 3/4 LB parts during a partial sponge bath at bed  level. Pt requires assistance to bathe L foot, however, able to bathe all other portions using a figure-four position to reach R foot.) Where Assessed-Lower Body Bathing: Bed level Upper Body Dressing: Moderate assistance (Pt requires re-education of hemi-dressing techniques. Pt able to doff and don a pull-over style shirt with moderate assistance to fully manage LUE and to pull-over head with minimal assist to ensure sitting balance at EOB d/t L lateral leaning.) Where Assessed-Upper Body Dressing: Edge of bed Lower Body Dressing: Moderate assistance (Pt able to participate in 2/3 LB dressing parts to don pants at bed level. Pt requires assistance to thread LLE through pants, however, pt able to thread RLE. Pt able to perform bridging technique with OT manage L side and pt manage R side to pull up.) Where Assessed-Lower Body Dressing: Bed level Toileting:  (Pt declines need at this time.) ADL Comments: Pt able to perform supine > sit transfer with L exit out of bed with moderate assistance to manage LLE and elevate trunk to sitting position. Pt requires minimal assist to ensure sitting balance at EOB due to L lateral leaning with ADLs. Pt then able to perform SPT to the R from EOB > w/c with maximal assistance and blocking of LLE. OT provides education on hemi-techniques with grooming tasks to use LUE as stabilizer when managing grooming tools. Physician assesses LUE and noted increased tone in L shoulder and bicep. Pt reports minimal pain with this. Pt able to improve verbal communication by saying 1-2 words to request tasks or direct care.  Pt requested to stay in the w/c at end of session. Pt left sitting comfortably in the w/c with personal belongings and call light within reach, belt alarm placed and activated, and comfort needs attended to.    Therapy/Group: Individual Therapy  Barbee Shropshire 08/07/2022, 10:06 AM

## 2022-08-07 NOTE — Progress Notes (Signed)
Physical Therapy Session Note  Patient Details  Name: Kara Bailey MRN: [id number] Date of Birth: 1955-12-12  Today's Date: 08/07/2022 PT Individual Time: 8144-8185 and 6314-9702 PT Individual Time Calculation (min): 43 min and 53 min  Short Term Goals: Week 1:  PT Short Term Goal 1 (Week 1): Pt will perform bed mobility with max assist of 1 consistently PT Short Term Goal 2 (Week 1): Pt will sit EOB with CGA >3 mintues PT Short Term Goal 3 (Week 1): Pt will propell WC >100with min assist PT Short Term Goal 4 (Week 1): Pt will tolerate standing >1 min with max assist from PT Week 2:     Skilled Therapeutic Interventions/Progress Updates:  Patient *** on entrance to room. Patient alert and agreeable to PT session.   Patient with no pain complaint at start of session.  Therapeutic Activity: Bed Mobility: Pt performed supine <> sit with ***. VC/ tc required for ***. Transfers: Pt performed sit<>stand and stand pivot transfers throughout session with ***. Provided verbal cues for***.  Gait Training:  Pt ambulated *** ft using *** with ***. Demonstrated ***. Provided vc/ tc for ***.  Wheelchair Mobility:  Pt propelled wheelchair *** feet with ***. Provided vc for ***.  Neuromuscular Re-ed: NMR facilitated during session with focus on muscle activation and fiber recruitment. Pt guided in ***. NMR performed for improvements in motor control and coordination, balance, sequencing, judgement, and self confidence/ efficacy in performing all aspects of mobility at highest level of independence.   Therapeutic Exercise: Pt performed the following exercises with vc/ tc for proper technique. ***  Patient *** at end of session with brakes locked, *** alarm set, and all needs within reach.  Session 2: Patient seated upright in w/c on entrance to room. Patient alert and agreeable to PT session.   Patient with no pain complaint at start of session.  Therapeutic Activity: Transfers: Pt  performed sit<>stand transfers throughout session with MinA and use of R hallway HR. Provided verbal cues for foot positioning, scooting forward and hip hinge for forward lean prior to rise to stand. Stand pivot transfer at end of session w/c to recliner to pt's L side with Mod/ MaxA overall and MaxA for LLE progression.  Gait Training:  Pt ambulated 74' x2 using R hallway HR for UE support and PT under pt's L arm for UB support. Pt requires MaxA for advancement, placement and stability of LLE throughout. Required vc/ tc for upright posture, R step placement and length, also vc for lateral weight shift with PT facilitation to LLE.  Pt emotionally labile following first ambulation bout and relates via attempt to verbalize and then through white board that she didn't think she would walk again and that she cannot wait to tell her family.   Neuromuscular Re-ed: NMR facilitated during session with focus on***. Pt guided in ***. NMR performed for improvements in motor control and coordination, balance, sequencing, judgement, and self confidence/ efficacy in performing all aspects of mobility at highest level of independence.   Patient seated in recliner at end of session with brakes locked, *** alarm set, and all needs within reach.    Therapy Documentation Precautions:  Precautions Precautions: Fall Precaution Comments: L hemi. expressive aphasia Restrictions Weight Bearing Restrictions: No General:   Vital Signs:   Pain:   Mobility:   Locomotion :    Trunk/Postural Assessment :    Balance:   Exercises:   Other Treatments:      Therapy/Group: Individual Therapy  Alger Simons PT, DPT, CSRS 08/07/2022, 6:29 PM

## 2022-08-07 NOTE — Progress Notes (Signed)
Patient ID: Kara Bailey, female   DOB: Oct 14, 1956, 66 y.o.   MRN: [id number]  VM left for patient son, [name].

## 2022-08-07 NOTE — Progress Notes (Signed)
Speech Language Pathology Daily Session Note  Patient Details  Name: Kara Bailey MRN: [id number] Date of Birth: 1956-09-01  Today's Date: 08/07/2022 SLP Individual Time: 0732-0815 SLP Individual Time Calculation (min): 43 min  Short Term Goals: Week 1: SLP Short Term Goal 1 (Week 1): Patient will communicate functional wants/needs/wishes/feelings through multimodal communication and min A verbal/visual cues for effectiveness SLP Short Term Goal 2 (Week 1): Patient will approximate production of single syllable words with max A verbal/visual cues to achieve 50% intelligibility SLP Short Term Goal 3 (Week 1): Patient will sustain vowel sound for up 5 seconds during 3 of 5 occasions with max A cues SLP Short Term Goal 4 (Week 1): Patient will utilize low tech communcation supports Passenger transport manager) and/or written communication to convey message with min A cues for effectiveness SLP Short Term Goal 5 (Week 1): Pt will consume dysphagia 1 textures with thin liquids with improved awareness of bolus/spillage, oral residuals, and without overt s/sx of aspiration with mod A to implement swallowing precautions and strategies  Skilled Therapeutic Interventions: Skilled ST treatment focused on swallowing and communication goals. Upon arrival, pt was greeted upright in bed and consuming  morning meal. It was quickly discovered pt was delivered the incorrect breakfast tray which contained dysphagia 3 textures and thin liquids. Pt is currently on a dysphagia 1 diet and thin liquids. SLP removed incorrect tray while explaining to pt the incorrect tray was delivered. Pt acknowledged error with head nod. SLP notified LPN, charge nurse, and nurse tech. Also submitted safety zone. A new dysphagia 1/thin liquids tray was ordered for pt by LPN, although this was not delivered during session. SLP retrieved a yogurt from unit per pt request. With dysphagia 3 textures (bite taken prior to SLP's arrival), pt exhibited  significantly prolonged and ineffective mastication, incomplete bolus breakdown, left buccal pocketing, and moderate oral residuals with bite of dysphagia 3 textures. Pt exhibited minimal awareness of difficulty as she gave a thumbs up and verbalized "good" when asked to provide insight on consumption of dysphagia 3 textures. Pt is not clinically ready for diet advancement at this time due to severity of oral deficits. Continue dysphagia 1 textures with thin liquids, crushed medications, and intermittent supervision. Continue dysphagia 2 trials during SLP tx.   SLP facilitated verbal expression tasks with min A verbal cues to implement breath support techniques and increase vocal loudness at the word level for yes/no responses, greetings, and short phrase level responses to basic questions. Pt exhibited improved carry over of breath support techniques, vocal intensity, and clearer vocal quality. Pt is encouraged to use written communication only as secondary support in effort to increase frequency of verbal communication for ongoing improvement. Pt verbalized understanding through "okay. "  Patient was left in bed with alarm activated and immediate needs within reach at end of session. Continue per current plan of care.       Pain    Therapy/Group: Individual Therapy  Patty Sermons 08/07/2022, 8:04 AM

## 2022-08-07 NOTE — Progress Notes (Signed)
Physical Therapy Session Note  Patient Details  Name: Kara Bailey MRN: [id number] Date of Birth: Mar 31, 1956  Today's Date: 08/07/2022 PT Individual Time: 1304-1330 PT Individual Time Calculation (min): 26 min   Short Term Goals: Week 1:  PT Short Term Goal 1 (Week 1): Pt will perform bed mobility with max assist of 1 consistently PT Short Term Goal 2 (Week 1): Pt will sit EOB with CGA >3 mintues PT Short Term Goal 3 (Week 1): Pt will propell WC >100with min assist PT Short Term Goal 4 (Week 1): Pt will tolerate standing >1 min with max assist from PT  Skilled Therapeutic Interventions/Progress Updates:     Pt received seated in WC and nods agreement to therapy. No complaint of pain. WC transport to gym for time management. Pt performs stand pivot transfer to mat with modA and cues for sequencing, with blocking of L knee. Pt sits on mat in short sitting with close supervision. PT provides manual soft tissue stretch of L upper extremity to alleviate flexor tone. Hand over hand guidance provided and pt places hand on mat table to provide WB and approximation of joints, combined with external rotation of shoulder. Pt able to achieve close-to-full extension of elbow PROM following therapy. Pt performs x3 sets of sit to stand with modA and blocking of L knee. In standing, PT facilitates lateral weight shifting with cues for hip extension and trunk extension. WC transport back to room. Left seated in WC with all needs within reach.   Therapy Documentation Precautions:  Precautions Precautions: Fall Precaution Comments: L hemi. expressive aphasia Restrictions Weight Bearing Restrictions: No   Therapy/Group: Individual Therapy  Breck Coons, PT, DPT 08/07/2022, 5:21 PM

## 2022-08-08 NOTE — Progress Notes (Signed)
Physical Therapy Weekly Progress Note  Patient Details  Name: Kara Bailey MRN: [id number] Date of Birth: 1956-07-31  Beginning of progress report period: August 03, 2022 End of progress report period: August 08, 2022  Today's Date: 08/08/2022 PT Individual Time: 1117-1200 and 1305-1400   43 min and 55 min   Patient has met 4 of 4 short term goals.  Pt is making slow progress towards LTG of min assist transfers and max assist gait overall. Has progressed to max +2 for ambulation with rail in hall and max assist squat pivot transfer to WC. Family education will be initiated closed to d/c.   Patient continues to demonstrate the following deficits muscle weakness, muscle joint tightness, and muscle paralysis, decreased cardiorespiratoy endurance, impaired timing and sequencing, abnormal tone, unbalanced muscle activation, and decreased coordination, decreased attention to left, and decreased sitting balance, decreased standing balance, decreased postural control, hemiplegia, and decreased balance strategies and therefore will continue to benefit from skilled PT intervention to increase functional independence with mobility.  Patient progressing toward long term goals..  Continue plan of care.  PT Short Term Goals Week 1:  PT Short Term Goal 1 (Week 1): Pt will perform bed mobility with max assist of 1 consistently PT Short Term Goal 1 - Progress (Week 1): Met PT Short Term Goal 2 (Week 1): Pt will sit EOB with CGA >3 mintues PT Short Term Goal 2 - Progress (Week 1): Met PT Short Term Goal 3 (Week 1): Pt will propell WC >100with min assist PT Short Term Goal 3 - Progress (Week 1): Met PT Short Term Goal 4 (Week 1): Pt will tolerate standing >1 min with max assist from PT PT Short Term Goal 4 - Progress (Week 1): Met Week 2:  PT Short Term Goal 1 (Week 2): pt will perform bed mobiltiy with mod assist PT Short Term Goal 2 (Week 2): Pt will transfer to [facility] with mod assist PT Short Term  Goal 3 (Week 2): Pt will propell WC 156f with min assist PT Short Term Goal 4 (Week 2): Pt will perform sit<>stand with mod assist consistently  Skilled Therapeutic Interventions/Progress Updates:  Session 1  Pt received sitting in WC and agreeable to PT. [facility]stand at rail in hall with mod assist. Weight shifting R and L and pregait stepping x 10 BLE each with cues for full knee extension on the LLE. DF wrap applied. Gait training with rail and max assist +2 to control LLE x 119f NMES applied to L thigh to attempt trgger activation of the L quad. Custom setting, width 320, asym, intensity 29. Noted to have flexor response intermittnetly to stimulaiton in sitting, but not noted while in standing. Trgger activation in stance on the LLE for gait x 15 with max assist +2 for WC follow. WC mobility with hemi technique x 10073fith min assist to prevent veer to the L. Patient returned to room and left sitting in WC [facility] in reach and all needs met.    Session 2.  Pt received sitting in WC and agreeable to PT. Pt transported to day room. Squat pivot transfer to Nustep with max assist from PT. Nustep reciprocal movement training with L thigh support x 6 min +3 min with therapeutic rest break between bouts. Level 2. Mod-max assist from PT for full ORM on the LLE throughout, but noted tohave L hip activation into extension intermittently. Squat pivot transfer to WC [facility] listed.  Transported to have rail in hall at rehab gym. Gait training with RDF wrap x 56f with max assist for knee extension and mod asist for LLE advancement. +2 for WC follow and cues for posture  throughout. [facility] and all needs met.          Therapy Documentation Precautions:  Precautions Precautions: Fall Precaution Comments: L hemi. expressive aphasia Restrictions Weight Bearing Restrictions: No General:   Vital  Signs: Therapy Vitals Temp: 98.1 F (36.7 C) Pulse Rate: 91 Resp: 20 BP: 128/66 Patient Position (if appropriate): Lying Oxygen Therapy SpO2: 99 % O2 Device: Room Air Pain: Denies is session 1 and 2    Therapy/Group: Individual Therapy  [name]9/27/2023, 8:13 AM

## 2022-08-08 NOTE — Plan of Care (Signed)
  Problem: RH Expression Communication Goal: LTG Patient will express needs/wants via multi-modal(SLP) Description: LTG:  Patient will express needs/wants via multi-modal communication (gestures/written, etc) with cues (SLP) Flowsheets (Taken 08/08/2022 1015) LTG: Patient will express needs/wants via multimodal communication (gestures/written, etc) with cueing (SLP): Modified Independent Goal: LTG Patient will increase speech intelligibility (SLP) Description: LTG: Patient will increase speech intelligibility at word/phrase/conversation level with cues, % of the time (SLP) Flowsheets (Taken 08/08/2022 1015) LTG: Patient will increase speech intelligibility (SLP):  Minimal Assistance - Patient > 75%  Moderate Assistance - Patient 50 - 74%

## 2022-08-08 NOTE — Progress Notes (Signed)
Patient ID: Kara Bailey, female   DOB: [birth year], 66 y.o.   MRN: [id number]  Team Conference Report to Patient/Family  Team Conference discussion was reviewed with the patient and caregiver, including goals, any changes in plan of care and target discharge date.  Patient and caregiver express understanding and are in agreement.  The patient has a target discharge date of 08/28/22.  Sw spoke with patient son, Kara Bailey and provided team conference updates. Son shares that he will remain the primary contact for his mother and share information with the rest of the family. Patient son pleased with updates and will discuss with family. Patient reports that he will call SW back with 3 days that work for him/family for family education.  Dyanne Iha 08/08/2022, 11:21 AM

## 2022-08-08 NOTE — Progress Notes (Signed)
Occupational Therapy Session Note  Patient Details  Name: Kara Bailey MRN: [id number] Date of Birth: May 04, 1956  Today's Date: 08/08/2022 OT Individual Time: 0915-1007 OT Individual Time Calculation (min): 52 min    Short Term Goals: Week 1:  OT Short Term Goal 1 (Week 1): Pt will be able to complete a sq pivot toilet transfer with max A of 1. OT Short Term Goal 2 (Week 1): Pt will be able to rise to stand and hold grab bar with R hand with mod A of 1. OT Short Term Goal 3 (Week 1): Pt will hold static stand with RUE support during LB self care. OT Short Term Goal 4 (Week 1): Pt will don a pullover shirt with min A OT Short Term Goal 5 (Week 1): Pt will demonstrate self management of L arm with positioning with min cues.  Skilled Therapeutic Interventions/Progress Updates:  Pt awake up in w/c and trying to get ChapStick open upon OT arrival to the room. Pt reports, "Good," referring to how pt is feeling. Pt in agreement for OT session.  Therapy Documentation Precautions:  Precautions Precautions: Fall Precaution Comments: L hemi. expressive aphasia Restrictions Weight Bearing Restrictions: No Vital Signs: Please see "Flowsheet" for most recent vitals charted by nursing staff.  Pain: Pain Assessment Pain Scale: 0-10 Pain Score: 0-No pain  ADL: Pt declines need to perform toilet and pt already dressed. OT provides maximal assistance for hair management to put hair up in a ponytail.   LUE Neuro Re-Education: Pt participates in LUE neuro re-education to improve motor control in LUE which is needed for increased independence in ADLs. OT performs PROM in LUE to assist with tone management with pt only able to tolerate ~90-100 degrees shoulder flexion with proper support to keep joint integrity. Pt noted to have tightness in pectoralis and tolerates prolonged positioning to stretch. Pt tolerates full PROM with elbow flex/ext with prolonged holds due to minimal tone noted in bicep.  Pt's LUE then placed at table-top with e-stim pads placed on middle deltoid and suprscapular muscles to assist with improving musculature to decrease subluxation. Pt tolerates e-stim using small muscle atrophy setting with 10 sec on / 5 sec off ramp cycles. Pt tolerates 10 minutes total with 15 mA. Pt noted to have significant muscle contractions in suprscapular muscles and intermittent muscle contractions in middle deltoid. Pt instructed to perform shoulder shrug during "on" cycle to improve neuro re-learning with pt presenting with good attention. Pt's skin inspected before and after e-stim with no concerns noted. Pt reports no discomfort after e-stim.   Pt requested to stay in the w/c at end of session. Pt left sitting comfortably in the w/c with personal belongings and call light within reach, belt alarm placed and activated, and comfort needs attended to.   Therapy/Group: Individual Therapy  Barbee Shropshire 08/08/2022, 12:40 PM

## 2022-08-08 NOTE — Progress Notes (Signed)
PROGRESS NOTE   Subjective/Complaints:  No issues overnite , pt pleased that she walked yesterday with therapy ROS: limited due to language/communication   Objective:   No results found. Recent Labs    08/06/22 0901  WBC 10.2  HGB 12.8  HCT 38.6  PLT 273    Recent Labs    08/06/22 0901  NA 144  K 3.5  CL 106  CO2 27  GLUCOSE 105*  BUN 17  CREATININE 0.89  CALCIUM 10.1     Intake/Output Summary (Last 24 hours) at 08/08/2022 0903 Last data filed at 08/08/2022 0700 Gross per 24 hour  Intake 360 ml  Output --  Net 360 ml         Physical Exam: Vital Signs Blood pressure 128/66, pulse 91, temperature 98.1 F (36.7 C), resp. rate 20, weight 59 kg, SpO2 99 %.    General: No acute distress Mood and affect are appropriate Heart: Regular rate and rhythm no rubs murmurs or extra sounds Lungs: Clear to auscultation, breathing unlabored, no rales or wheezes Abdomen: Positive bowel sounds, soft nontender to palpation, nondistended Extremities: No clubbing, cyanosis, or edema Skin: No evidence of breakdown, no evidence of rash  Neurologic: Intact comprehension, needs to use writing tablet to express herself. Cranial nerves II through XII intact, motor strength is 5/5 in right , 0/5 left deltoid, bicep, tricep, grip, hip flexor, knee extensors, ankle dorsiflexor and plantar flexor Sensory exam pt reports some sensation but impaired LUE>LLE Musculoskeletal: Full range of motion in all 4 extremities. No joint swelling   Assessment/Plan: 1. Functional deficits which require 3+ hours per day of interdisciplinary therapy in a comprehensive inpatient rehab setting. Physiatrist is providing close team supervision and 24 hour management of active medical problems listed below. Physiatrist and rehab team continue to assess barriers to discharge/monitor patient progress toward functional and medical goals  Care  Tool:  Bathing    Body parts bathed by patient: Chest, Abdomen, Right upper leg, Left upper leg, Face, Left arm   Body parts bathed by helper: Right arm, Front perineal area, Buttocks, Right lower leg, Left lower leg     Bathing assist Assist Level: Moderate Assistance - Patient 50 - 74%     Upper Body Dressing/Undressing Upper body dressing   What is the patient wearing?: Pull over shirt    Upper body assist Assist Level: Moderate Assistance - Patient 50 - 74%    Lower Body Dressing/Undressing Lower body dressing      What is the patient wearing?: Underwear/pull up, Pants     Lower body assist Assist for lower body dressing: Maximal Assistance - Patient 25 - 49%     Toileting Toileting    Toileting assist Assist for toileting: Maximal Assistance - Patient 25 - 49%     Transfers Chair/bed transfer  Transfers assist  Chair/bed transfer activity did not occur: Safety/medical concerns  Chair/bed transfer assist level: Maximal Assistance - Patient 25 - 49%     Locomotion Ambulation   Ambulation assist   Ambulation activity did not occur: Safety/medical concerns          Walk 10 feet activity   Assist  Walk 10  feet activity did not occur: Safety/medical concerns        Walk 50 feet activity   Assist Walk 50 feet with 2 turns activity did not occur: Safety/medical concerns         Walk 150 feet activity   Assist Walk 150 feet activity did not occur: Safety/medical concerns         Walk 10 feet on uneven surface  activity   Assist Walk 10 feet on uneven surfaces activity did not occur: Safety/medical concerns         Wheelchair     Assist Is the patient using a wheelchair?: Yes Type of Wheelchair: Manual    Wheelchair assist level: Dependent - Patient 0% Max wheelchair distance: 150    Wheelchair 50 feet with 2 turns activity    Assist        Assist Level: Dependent - Patient 0%   Wheelchair 150 feet  activity     Assist      Assist Level: Dependent - Patient 0%   Blood pressure 128/66, pulse 91, temperature 98.1 F (36.7 C), resp. rate 20, weight 59 kg, SpO2 99 %.  Medical Problem List and Plan: 1. 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  Antithrombotics: -DVT/anticoagulation:  Pharmaceutical: Lovenox doppler without evidence of DVT              -antiplatelet therapy: continue Plavix and aspirin as PTA 3. Pain Management: tylenol as needed 4. Mood/Behavior/Sleep: LCSW to evaluate and provide emotional support             -antipsychotic agents: n/a             -bupropion ER 150 mg q AM             -mirtazapine 30 mg q HS             -alprazolam 0.5 mg daily prn anxiety 5. Neuropsych/cognition: This patient is capable of making decisions on her own behalf. 6. Skin/Wound Care: routine skin care checks 7. Fluids/Electrolytes/Nutrition: routine Is and Os and follow-up chemistries             Dysphagia 1 diet/thin-SLP eval-  -explained the importance of her nutrition 8: Left-sided weakness/decreased sensation: check LLE venous duplex 9: Hypertension: continue to monitor;  avoid hypotension             -continue Cardizem 180 mg daily   Vitals:   08/07/22 1924 08/08/22 0525  BP: (!) 126/96 128/66  Pulse: (!) 102 91  Resp: 17 20  Temp: 98.4 F (36.9 C) 98.1 F (36.7 C)  SpO2: 99% 99%   Controlled HR borderline tachy  10: Hyperlipidemia: LDL = 125, counsel regarding healthy diet             -continue  ezetimibe 10 mg daily             -continue atorvastatin 90 mg daily 11: Tobacco use: cessation counseling             -continue nicotine patch 21 mg 12. Constipation              -  Order sorbitol  -small,hard bm 9/22 13. GERD             -Continue PPI  14.  Dysarthria improving cont SLP  LOS: 7 days A FACE TO FACE EVALUATION WAS PERFORMED  Charlett Blake 08/08/2022, 9:03 AM

## 2022-08-08 NOTE — Progress Notes (Signed)
Speech Language Pathology Daily Session Note  Patient Details  Name: Kara Bailey MRN: [id number] Date of Birth: 10-Dec-1955  Today's Date: 08/08/2022 SLP Individual Time: 0810-0900 SLP Individual Time Calculation (min): 50 min and Today's Date: 08/08/2022 SLP Missed Time: 10 Minutes Missed Time Reason: Nursing care  Short Term Goals: Week 1: SLP Short Term Goal 1 (Week 1): Patient will communicate functional wants/needs/wishes/feelings through multimodal communication and min A verbal/visual cues for effectiveness SLP Short Term Goal 2 (Week 1): Patient will approximate production of single syllable words with max A verbal/visual cues to achieve 50% intelligibility SLP Short Term Goal 3 (Week 1): Patient will sustain vowel sound for up 5 seconds during 3 of 5 occasions with max A cues SLP Short Term Goal 4 (Week 1): Patient will utilize low tech communcation supports Passenger transport manager) and/or written communication to convey message with min A cues for effectiveness SLP Short Term Goal 5 (Week 1): Pt will consume dysphagia 1 textures with thin liquids with improved awareness of bolus/spillage, oral residuals, and without overt s/sx of aspiration with mod A to implement swallowing precautions and strategies  Skilled Therapeutic Interventions: Skilled ST treatment focused on speech goals. Pt was greeted upright in wheelchair, having just had assistance from NT for toileting needs. Pt requested assistance with dressing through non-verbal means via gestures and pointing. Pt with minimal voicing attempts today and when she did, phonation was characterized by severely reduced vocal intensity, vocal hoarseness/roughness, and limited intelligibility with <25% at the word level. Pt required max A cues to implement breath support techniques which minimally improved speech intelligibility.   SLP initiated respiratory muscle training (RMT) evaluation. Pt was severely deficient with both inspiratory and  expiratory thresholds. Diffculty partially likely attributed to reduced labial seal. Pt achieved 9cm H2O with mean expiratory threshold (MET), and 1 cm H2O with mean inspiratory threshold (MIT). SLP initiated IMT device set to lowest setting of 9. Pt was unable to obtain adequate inhalation for use. Will consider other inspiratory/breath support exercice(s) in effort to increase ability to utilize in future.   Pt consumed crushed medications in applesauce followed by rinse of thin liquids with delayed cough x1. Pt required multiple swallows between bites. Continue crushed meds at this time.   Patient was left in wheelchair with alarm activated and immediate needs within reach at end of session. Continue per current plan of care.       Pain Pain Assessment Pain Scale: 0-10 Pain Score: 0-No pain  Therapy/Group: Individual Therapy  Patty Sermons 08/08/2022, 10:22 AM

## 2022-08-08 NOTE — Progress Notes (Signed)
Patient ID: Kara Bailey, female   DOB: February 11, 1956, 66 y.o.   MRN: [id number]  VM left for patient son to discuss family education

## 2022-08-08 NOTE — Patient Care Conference (Signed)
Inpatient RehabilitationTeam Conference and Plan of Care Update Date: 08/08/2022   Time: 10:20 AM    Patient Name: Kara Bailey      Medical Record Number: [id number]  Date of Birth: 1955/12/17 Sex: Female         Room/Bed: 4W11C/4W11C-01 Payor Info: Payor: [employer] MEDICARE / Plan: [employer] MEDICARE HMO / Product Type: *No Product type* /    Admit Date/Time:  08/01/2022  6:21 PM  Primary Diagnosis:  Ischemic stroke [facility])  Hospital Problems: Principal Problem:   Ischemic stroke [facility])    Expected Discharge Date: Expected Discharge Date: 08/28/22  Team Members Present: Physician leading conference: Dr. Alysia Penna Social Worker Present: Erlene Quan, BSW Nurse Present: Dorien Chihuahua, RN PT Present: Barrie Folk, PT OT Present: Jennefer Bravo, OT SLP Present: Sherren Kerns, SLP PPS Coordinator present : Gunnar Fusi, SLP     Current Status/Progress Goal Weekly Team Focus  Bowel/Bladder   Continent with some incontinent episodes  Reduce incontinent episodes  Assist with toileting as needed   Swallow/Nutrition/ Hydration   dysphagia 1 diet, thin liquids; min A for implementation of swallowing strategies  sup A  tolerance of current diet, implementation of strategies, dys 2 PO trials   ADL's   minimal assist with UB bathing, maximal assist with LB bathing & dressing, moderate assist with UB dressing with hemi-techniques. maximal assist for SPT to the R, total assist + Stedy for toileting and toilet transfer. Dense L hemi with increased tone in shoulder & bicep with minimal pain with PROM  minimal assist  hemi-techniques with ADLs, transfers, LUE NMR, core strengthening, dynamic sitting balance   Mobility   L hemiplegia with flexor tone in UE and mild tone in distal quad. bed mobility = Mod/ Max A, Squat pivot = MaxA, pull to stand = MinA, push to stand = MaxA, stand pivot = MaxA, ambulation at R handrail with MaxA for LLE advancement  Min/ ModA for mobiltiy/ transfers;  MaxA for ambulation  continued high intensity gait training, L hemibody NMR, estim, improving LOA for transfers, sitting and standing balance/ tolerance, family education soon   Communication   max A  mod I multimodal communication; min-to-mod A for speech strategies at the word level  Inspiratory muscle strength training (IMST), speech intelligibility strategies   Safety/Cognition/ Behavioral Observations            Pain   no c/o pain  remain pain free  Assess qshift and prn   Skin   Skin intact  remain intact  Assess Qshift and prn     Discharge Planning:  Discharging home with boyfriend and son [name] to assist   Team Discussion: Patient with poor motor return and dense left hemiparesis; dysarthria  post stroke with minimal pain noted  with PROM.  Patient on target to meet rehab goals: yes, currently needs min assist for upper body care  and max assist for lower body care, total assist for toileting but is continent. Needs min assist to stand , max assist to stand pivot to the right. Able to ambulate at the rail with max assist but knee extension completed by staff. Goals for discharge set for min assist overall, max assist for ambulation and anticipate will be w/c level at discharge.  *See Care Plan and progress notes for long and short-term goals.   Revisions to Treatment Plan:  RMST D2 diet trials   Teaching Needs: Safety, medications, dietary modifications, transfers, toileting, etc.   Current Barriers to Discharge: Decreased caregiver  support and Home enviroment access/layout  Possible Resolutions to Barriers: Family education HH follow up services DME: DA-BSC, TTB, RW     Medical Summary Current Status: BPs improved, cont of bowel and bladder,poor appetite  Barriers to Discharge: Medical stability   Possible Resolutions to [employer] Focus: Improve intake , may need food from home, cont d/c planning   Continued Need for Acute Rehabilitation Level of Care:  The patient requires daily medical management by a physician with specialized training in physical medicine and rehabilitation for the following reasons: Direction of a multidisciplinary physical rehabilitation program to maximize functional independence : Yes Medical management of patient stability for increased activity during participation in an intensive rehabilitation regime.: Yes Analysis of laboratory values and/or radiology reports with any subsequent need for medication adjustment and/or medical intervention. : Yes   I attest that I was present, lead the team conference, and concur with the assessment and plan of the team.   Dorien Chihuahua B 08/08/2022, 2:58 PM

## 2022-08-08 NOTE — Plan of Care (Signed)
  Problem: RH Swallowing Goal: LTG Patient will consume least restrictive diet using compensatory strategies with assistance (SLP) Description: LTG:  Patient will consume least restrictive diet using compensatory strategies with assistance (SLP) Flowsheets (Taken 08/08/2022 1017) LTG: Pt Patient will consume least restrictive diet using compensatory strategies with assistance of (SLP): Supervision   Problem: RH Swallowing Goal: LTG Pt will demonstrate functional change in swallow as evidenced by bedside/clinical objective assessment (SLP) Description: LTG: Patient will demonstrate functional change in swallow as evidenced by bedside/clinical objective assessment (SLP) Flowsheets (Taken 08/08/2022 1017) LTG: Patient will demonstrate functional change in swallow as evidenced by bedside/clinical objective assessment: Oropharyngeal swallow

## 2022-08-09 NOTE — Progress Notes (Signed)
Speech Language Pathology Daily Session Note  Patient Details  Name: Kara Bailey MRN: [id number] Date of Birth: 1956/05/07  Today's Date: 08/09/2022 SLP Individual Time: 3335-4562 SLP Individual Time Calculation (min): 42 min  Short Term Goals: Week 1: SLP Short Term Goal 1 (Week 1): Patient will communicate functional wants/needs/wishes/feelings through multimodal communication and min A verbal/visual cues for effectiveness SLP Short Term Goal 2 (Week 1): Patient will approximate production of single syllable words with max A verbal/visual cues to achieve 50% intelligibility SLP Short Term Goal 3 (Week 1): Patient will sustain vowel sound for up 5 seconds during 3 of 5 occasions with max A cues SLP Short Term Goal 4 (Week 1): Patient will utilize low tech communcation supports Passenger transport manager) and/or written communication to convey message with min A cues for effectiveness SLP Short Term Goal 5 (Week 1): Pt will consume dysphagia 1 textures with thin liquids with improved awareness of bolus/spillage, oral residuals, and without overt s/sx of aspiration with mod A to implement swallowing precautions and strategies  Skilled Therapeutic Interventions: Skilled ST treatment focused on speech goals. Pt greeted in bed on arrival and agreeable to ST intervention. SLP facilitated implementation of speech intelligibility strategies at the word level with overall max A for use of breath support techniques, pausing between syllables, slowing rate, and over articulation. SLP emphasized functional word production of pt's family members names including partner, children, grandchildren, great grandchildren, and pets. Pt appeared highly motivated to discuss her family and appeared to enjoy this activity. Pt achieved ~25% intelligibility with approximation at the phoneme and word level and utilized dry erase board to supplement expression during communication breakdowns during 30-50% of occasions with sup A  verbal cues for effectiveness. Some spelling errors were noted.Pt with particular difficulty producing the aveolar lateral approximate /l/, fricatives (e.g., /z/), and back consonants (i.e., /k, g/). Pt continues to exhibit uncoordinated breath-to-speech which substantially limits vocal intensity. Phonation attempts are characterized as strained with periods of aphonia. Pt sustained vowel "ah" for 2 second average given max A to coordinate breath with phonation. Patient was left in bed with alarm activated and immediate needs within reach at end of session. Continue per current plan of care.      Pain  None/pain   Therapy/Group: Individual Therapy  Patty Sermons 08/09/2022, 1:33 PM

## 2022-08-09 NOTE — Progress Notes (Signed)
Speech Language Pathology Weekly Progress and Session Note  Patient Details  Name: Kara Bailey MRN: [id number] Date of Birth: 04/16/1956  Beginning of progress report period: August 02, 2022 End of progress report period: August 09, 2022  Short Term Goals: Week 1: SLP Short Term Goal 1 (Week 1): Patient will communicate functional wants/needs/wishes/feelings through multimodal communication and min A verbal/visual cues for effectiveness SLP Short Term Goal 1 - Progress (Week 1): Not met SLP Short Term Goal 2 (Week 1): Patient will approximate production of single syllable words with max A verbal/visual cues to achieve 50% intelligibility SLP Short Term Goal 2 - Progress (Week 1): Not met SLP Short Term Goal 3 (Week 1): Patient will sustain vowel sound for up 5 seconds during 3 of 5 occasions with max A cues SLP Short Term Goal 3 - Progress (Week 1): Revised due to lack of progress SLP Short Term Goal 4 (Week 1): Patient will utilize low tech communcation supports (communication board) and/or written communication to convey message with min A cues for effectiveness SLP Short Term Goal 4 - Progress (Week 1): Met SLP Short Term Goal 5 (Week 1): Pt will consume dysphagia 1 textures with thin liquids with improved awareness of bolus/spillage, oral residuals, and without overt s/sx of aspiration with mod A to implement swallowing precautions and strategies SLP Short Term Goal 5 - Progress (Week 1): Met  New Short Term Goals: Week 2: SLP Short Term Goal 1 (Week 2): Patient will communicate functional wants/needs/wishes/feelings through multimodal communication and min A verbal/visual cues for effectiveness SLP Short Term Goal 2 (Week 2): Patient will approximate production of single syllable words with max A verbal/visual cues to achieve 50% intelligibility SLP Short Term Goal 3 (Week 2): Patient will sustain vowel sound for up 3 seconds during 3 of 5 occasions with max A cues SLP Short  Term Goal 4 (Week 2): Patient will utilize low tech communcation supports (communication board) and/or written communication to convey message with sup A cues for effectiveness SLP Short Term Goal 5 (Week 2): Pt will consume dysphagia 1 textures with thin liquids with improved awareness of bolus/spillage, oral residuals, and without overt s/sx of aspiration with min A to implement swallowing precautions and strategies  Weekly Progress Updates: Pt has demonstrated slow gains, as evident by meeting 2 out of 5 short-term goals this reporting period. Pt continues to be limited by severity of deficits impacting motor speech production, voice, and oral > pharyngeal dysphagia. Pt is currently communicating functional wants/needs/ideas/wishes through multimodal means with overall min-to-mod A to initiate and for effectiveness, and max A for verbal approximation at the phoneme and single syllable word level. Pt is currently consuming a dysphagia 1 diet with thin liquids and min-to-mod A to implement swallowing compensatory strategies. Pt has participated in dysphagia 2 trials, however not yet clinically appropriate to consider diet advancement due to severity of oral deficits. Pt and family education ongoing re: diet recommendations, supportive communication interventions, and ST POC. Pt remains highly motivated to participate in therapy. Continue to recommend ST intervention during CIR admission, as well as ST f/u upon d/c. Recommend 24/7 supervision and assistance at time of discharge due to communication barrier at this time.      Intensity: Minumum of 1-2 x/day, 30 to 90 minutes Frequency: 3 to 5 out of 7 days Duration/Length of Stay: 10/17 Treatment/Interventions: Dysphagia/aspiration precaution training;Speech/Language facilitation;Oral motor exercises;Therapeutic Activities;Functional tasks;Patient/family education;Multimodal communication approach   Therapy/Group: Individual Therapy  Brianne T  Garretson 08/09/2022,   4:09 PM       

## 2022-08-09 NOTE — Progress Notes (Signed)
Occupational Therapy Session Note  Patient Details  Name: Kara Bailey MRN: [id number] Date of Birth: [birth year]  Today's Date: 08/09/2022 OT Individual Time: 1000-1045 OT Individual Time Calculation (min): 45 min    Short Term Goals: Week 1:  OT Short Term Goal 1 (Week 1): Pt will be able to complete a sq pivot toilet transfer with max A of 1. OT Short Term Goal 2 (Week 1): Pt will be able to rise to stand and hold grab bar with R hand with mod A of 1. OT Short Term Goal 3 (Week 1): Pt will hold static stand with RUE support during LB self care. OT Short Term Goal 4 (Week 1): Pt will don a pullover shirt with min A OT Short Term Goal 5 (Week 1): Pt will demonstrate self management of L arm with positioning with min cues.  Skilled Therapeutic Interventions/Progress Updates:    Pt resting in bed upon arrival and agreeable to getting OOB and dressing. Pt expressively aphasic but follows all commands and makes needs known with gestures. Supine>sit EOB with mod A. Sit<>stand in Stedy with max A+1. Pt selected clothing independently at w/c level. UB dressing with mod A. Pt initiates correct strategies. LB dressing with max A with sit<stand in [place] with max A+2 (clothing mgmt). Pt remained in w/c with all needs within reach. Belt alarm activated. Half lap tray in place.  Therapy Documentation Precautions:  Precautions Precautions: Fall Precaution Comments: L hemi. expressive aphasia Restrictions Weight Bearing Restrictions: No   Pain:  Pt denies pain this morning   Therapy/Group: Individual Therapy  Leroy Libman 08/09/2022, 12:15 PM

## 2022-08-09 NOTE — Progress Notes (Signed)
PROGRESS NOTE   Subjective/Complaints: No new complaints this morning Uses her communication board to communicate that she would like 4 sessions of therapy per day  ROS: Limited due to language/communication   Objective:   No results found. No results for input(s): "WBC", "HGB", "HCT", "PLT" in the last 72 hours. No results for input(s): "NA", "K", "CL", "CO2", "GLUCOSE", "BUN", "CREATININE", "CALCIUM" in the last 72 hours.  Intake/Output Summary (Last 24 hours) at 08/09/2022 1105 Last data filed at 08/09/2022 0853 Gross per 24 hour  Intake 478 ml  Output --  Net 478 ml        Physical Exam: Vital Signs Blood pressure 123/84, pulse 97, temperature 98.7 F (37.1 C), temperature source Oral, resp. rate 18, weight 59.4 kg, SpO2 98 %.   Gen: no distress, normal appearing HEENT: oral mucosa pink and moist, NCAT Cardio: Reg rate Chest: normal effort, normal rate of breathing Abd: soft, non-distended Ext: no edema Psych: pleasant, normal affect Skin: intact   Neurologic: Intact comprehension, needs to use writing tablet to express herself. Cranial nerves II through XII intact, motor strength is 5/5 in right , 0/5 left deltoid, bicep, tricep, grip, hip flexor, knee extensors, ankle dorsiflexor and plantar flexor Sensory exam pt reports some sensation but impaired LUE>LLE Musculoskeletal: Full range of motion in all 4 extremities. No joint swelling   Assessment/Plan: 1. Functional deficits which require 3+ hours per day of interdisciplinary therapy in a comprehensive inpatient rehab setting. Physiatrist is providing close team supervision and 24 hour management of active medical problems listed below. Physiatrist and rehab team continue to assess barriers to discharge/monitor patient progress toward functional and medical goals  Care Tool:  Bathing    Body parts bathed by patient: Chest, Abdomen, Right upper leg,  Left upper leg, Face, Left arm   Body parts bathed by helper: Right arm, Front perineal area, Buttocks, Right lower leg, Left lower leg     Bathing assist Assist Level: Moderate Assistance - Patient 50 - 74%     Upper Body Dressing/Undressing Upper body dressing   What is the patient wearing?: Pull over shirt    Upper body assist Assist Level: Moderate Assistance - Patient 50 - 74%    Lower Body Dressing/Undressing Lower body dressing      What is the patient wearing?: Underwear/pull up, Pants     Lower body assist Assist for lower body dressing: Maximal Assistance - Patient 25 - 49%     Toileting Toileting    Toileting assist Assist for toileting: Maximal Assistance - Patient 25 - 49%     Transfers Chair/bed transfer  Transfers assist  Chair/bed transfer activity did not occur: Safety/medical concerns  Chair/bed transfer assist level: Maximal Assistance - Patient 25 - 49%     Locomotion Ambulation   Ambulation assist   Ambulation activity did not occur: Safety/medical concerns          Walk 10 feet activity   Assist  Walk 10 feet activity did not occur: Safety/medical concerns        Walk 50 feet activity   Assist Walk 50 feet with 2 turns activity did not occur: Safety/medical concerns  Walk 150 feet activity   Assist Walk 150 feet activity did not occur: Safety/medical concerns         Walk 10 feet on uneven surface  activity   Assist Walk 10 feet on uneven surfaces activity did not occur: Safety/medical concerns         Wheelchair     Assist Is the patient using a wheelchair?: Yes Type of Wheelchair: Manual    Wheelchair assist level: Dependent - Patient 0% Max wheelchair distance: 150    Wheelchair 50 feet with 2 turns activity    Assist        Assist Level: Dependent - Patient 0%   Wheelchair 150 feet activity     Assist      Assist Level: Dependent - Patient 0%   Blood pressure  123/84, pulse 97, temperature 98.7 F (37.1 C), temperature source Oral, resp. rate 18, weight 59.4 kg, SpO2 98 %.  Medical Problem List and Plan: 1. 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  Antithrombotics: -DVT/anticoagulation:  Pharmaceutical: Lovenox doppler without evidence of DVT              -antiplatelet therapy: continue Plavix and aspirin as PTA 3. Pain Management: tylenol as needed 4. Depression/insomnia: LCSW to evaluate and provide emotional support             -antipsychotic agents: n/a             -bupropion ER 150 mg q AM             -continue mirtazapine 30 mg q HS             -alprazolam 0.5 mg daily prn anxiety 5. Neuropsych/cognition: This patient is capable of making decisions on her own behalf. 6. Skin/Wound Care: routine skin care checks 7. Dysphagia: routine Is and Os and follow-up chemistries             continue Dysphagia 1 diet/thin-SLP eval-  -explained the importance of her nutrition 8: Left-sided weakness/decreased sensation: check LLE venous duplex- not yet performed 9: Hypertension: continue to monitor;  avoid hypotension             -continue Cardizem 180 mg daily   Vitals:   08/09/22 0416 08/09/22 0808  BP: 119/74 123/84  Pulse: 92 97  Resp: 18   Temp: 98.7 F (37.1 C)   SpO2: 98%    Controlled HR borderline tachy  10: Hyperlipidemia: LDL = 125, counsel regarding healthy diet             -continue  ezetimibe 10 mg daily             -continue atorvastatin 90 mg daily 11: Tobacco use: cessation counseling             -continue nicotine patch 21 mg 12. Constipation             -Order sorbitol  -small,hard bm 9/22 13. GERD              -Continue PPI  14.  Dysarthria  improving cont SLP  LOS: 8 days A FACE TO FACE EVALUATION WAS PERFORMED  Hutchinson Isenberg P Colin Ellers 08/09/2022, 11:05 AM

## 2022-08-09 NOTE — Progress Notes (Signed)
Physical Therapy Session Note  Patient Details  Name: Kara Bailey MRN: [id number] Date of Birth: 27-Jan-1956  Today's Date: 08/09/2022 PT Individual Time: 1120-1200 NS 1420-1530   40 min and 70 min   Short Term Goals: Week 1:  PT Short Term Goal 1 (Week 1): Pt will perform bed mobility with max assist of 1 consistently PT Short Term Goal 1 - Progress (Week 1): Met PT Short Term Goal 2 (Week 1): Pt will sit EOB with CGA >3 mintues PT Short Term Goal 2 - Progress (Week 1): Met PT Short Term Goal 3 (Week 1): Pt will propell WC >100with min assist PT Short Term Goal 3 - Progress (Week 1): Met PT Short Term Goal 4 (Week 1): Pt will tolerate standing >1 min with max assist from PT PT Short Term Goal 4 - Progress (Week 1): Met Week 2:  PT Short Term Goal 1 (Week 2): pt will perform bed mobiltiy with mod assist PT Short Term Goal 2 (Week 2): Pt will transfer to [facility] with mod assist PT Short Term Goal 3 (Week 2): Pt will propell WC 146f with min assist PT Short Term Goal 4 (Week 2): Pt will perform sit<>stand with mod assist consistently   Skilled Therapeutic Interventions/Progress Updates:  Sessoin 1  Pt received sitting in WC and agreeable to PT. [facility] to mat table and max assist and max cues for sequencing. Lateral scooting on EOB with max assist progressing to mod assist with LLE blocked. . Dynamic sitting balance/trunkal NMR ball cath wit total A for LUE management. Ball tap with back supported 2 x 1 min and no back support outside BOS x 1.517m. min assist intemittently but supervision assist overall. Kinetron reciprocal movement training 3 x 1 min with 1 min rest breka between bout with cues for full ROM on the LLE. [facility] and all needs met.    Session 2.   Pt received sitting in WC and agreeable to PT. [facility] with HW x 8  throughout session with max assist. Partial squat 2 x 5 with noted activation in BLE. Weight shifting R and L 3 x 5 with cues for posture, glute activation and awareness of midline. Pregait stepping 2 x 6 BLE with PT blocking the LLE in stance and CUES FOR activation of L LE and posture throughout. Gait training with HW 2 x 1510fith max assist +2 for blocking LL Ein stnace and adequate weight shift to advance the LLE. Pt able to preform partial LLE advancement. WC mobility with hemi technique x [id number] min assist from PT to prevent veer to the L and improve sequencing of UE and LE coordination. [facility] to nustep. Performed with R thigh support x 5 min with min assist to initiation movement on the LLE. Patient returned to room and [facility] in reach and all needs met.          Therapy Documentation Precautions:  Precautions Precautions: Fall Precaution Comments: L hemi. expressive aphasia Restrictions Weight Bearing Restrictions: No    Vital Signs: Therapy Vitals Temp: 98.7 F (37.1 C) Temp Source: Oral Pulse Rate: 92 Resp: 18 BP: 119/74 Patient Position (if appropriate): Lying Oxygen Therapy SpO2: 98 % O2 Device: Room Air Pain:  Denies in both session  Therapy/Group: Individual Therapy  Lorie Phenix 08/09/2022, 8:04 AM

## 2022-08-10 LAB — RESPIRATORY PANEL BY PCR

## 2022-08-10 NOTE — Progress Notes (Signed)
Physical Therapy Session Note  Patient Details  Name: Kara Bailey MRN: [id number] Date of Birth: 12/10/1955  Today's Date: 08/10/2022 PT Individual Time: [phone] and [id number] PT Individual Time Calculation (min): 57 min and 16 min And  PT Amount of Missed Time  (min): 14 min PT Missed Treatment Reason: Patient Ill (Pt experiencing cold symptoms this day -- COVID negative)  Short Term Goals: Week 1:  PT Short Term Goal 1 (Week 1): Pt will perform bed mobility with max assist of 1 consistently PT Short Term Goal 1 - Progress (Week 1): Met PT Short Term Goal 2 (Week 1): Pt will sit EOB with CGA >3 mintues PT Short Term Goal 2 - Progress (Week 1): Met PT Short Term Goal 3 (Week 1): Pt will propell WC >100with min assist PT Short Term Goal 3 - Progress (Week 1): Met PT Short Term Goal 4 (Week 1): Pt will tolerate standing >1 min with max assist from PT PT Short Term Goal 4 - Progress (Week 1): Met Week 2:  PT Short Term Goal 1 (Week 2): pt will perform bed mobiltiy with mod assist PT Short Term Goal 2 (Week 2): Pt will transfer to [facility] with mod assist PT Short Term Goal 3 (Week 2): Pt will propell WC 13ft with min assist PT Short Term Goal 4 (Week 2): Pt will perform sit<>stand with mod assist consistently  Skilled Therapeutic Interventions/Progress Updates:  Patient seated upright in w/c on entrance to room. Patient alert and agreeable to PT session.   Patient with no pain complaint at start of session. But points to nose and chest with pt exhibiting runny nose throughout session. Does not indicate pain or fever when asked.  Therapeutic Activity: Transfers: Pt performed sit<>stand transfers at hallway HR to R and using HW with MinA to reach full upright stance. Squat pivot as described below.  Provided verbal cues for technique throughout including continued focus on foot positioning and forward scoot in seat. PT controlling position of LLE throughout.   Gait Training:  Pt  ambulated 38' x1 using hallway HR with MaxA for LLE advancement and +2 for w/c follow. Pt also ambulated 72' x1 using HW along R wall in hallway for guidance in placement. Pt reaches 45' x1 with vc throughout for sequencing AD. Pt also requires facilitation for L hip advancement, upright postitioning, vc for holding head level. R step length improves with added support to back of L knee to prevent hyperextension during stance phase.   Neuromuscular Re-ed: NMR facilitated during session with focus on sitting balance, motor control, and posture. Pt guided in seated squat pivot and scoot to R with pt requiring ModA to complete half distance, then able to perform lateral scoot with CGA and heavy vc for technique including hand positioning, forward lean to unweight, and effort using RLE. Upright posture encouraged while seated EOM with support provided by PT to paraspinals for facilitation of extension and tc for improved posture. Scapular mobilizations provided to improve movement  NMR performed for improvements in motor control and coordination, balance, sequencing, judgement, and self confidence/ efficacy in performing all aspects of mobility at highest level of independence.   Patient seated upright at end of session with brakes locked, belt alarm set, and all needs within reach.  Session 2: Patient supine in bed on entrance to room. Patient alert but not agreeable to PT session. Pt relates feeling bad and again points to nose. Update from RN that pt is experiencing symptoms of health  change but has tested (-) for COVID. Will be monitored throughout the evening.   Pt desires to stay in bed and is provided with covers. Education provided to pt that even though she may feel run down, it will be good to get out of bed tomorrow with therapy and continue to mobilize in order to capitalize on time in rehab despite cold symptoms. She will not be hurting herself to participate and can always let therapy know when  she is tired/ fatigued and unable to continue. Pt demos understanding and pulls covers over L side.   Patient supine at end of session with brakes locked, bed alarm set, and all needs within reach.   Therapy Documentation Precautions:  Precautions Precautions: Fall Precaution Comments: L hemi. expressive aphasia Restrictions Weight Bearing Restrictions: No General:   Vital Signs: Therapy Vitals Temp: 98.3 F (36.8 C) Pulse Rate: 92 Resp: 17 BP: 121/83 Patient Position (if appropriate): Lying Oxygen Therapy SpO2: 100 % O2 Device: Room Air Pain:  No pain complaint this day.   Therapy/Group: Individual Therapy  Alger Simons PT, DPT, CSRS 08/10/2022, 7:46 AM

## 2022-08-10 NOTE — Progress Notes (Signed)
PROGRESS NOTE   Subjective/Complaints: Sneezing and coughing- respiratory panel ordered +malaise Missed therapy due to feeling unwell Increased fatigue  ROS: +malaise  Objective:   No results found. No results for input(s): "WBC", "HGB", "HCT", "PLT" in the last 72 hours. No results for input(s): "NA", "K", "CL", "CO2", "GLUCOSE", "BUN", "CREATININE", "CALCIUM" in the last 72 hours.  Intake/Output Summary (Last 24 hours) at 08/10/2022 1252 Last data filed at 08/10/2022 0807 Gross per 24 hour  Intake 357 ml  Output --  Net 357 ml        Physical Exam: Vital Signs Blood pressure 112/65, pulse 98, temperature 97.9 F (36.6 C), temperature source Oral, resp. rate 18, weight 59.4 kg, SpO2 100 %.   Gen: no distress, normal appearing HEENT: oral mucosa pink and moist, NCAT, +sneezing and coughing Cardio: Reg rate Chest: normal effort, normal rate of breathing Abd: soft, non-distended Ext: no edema Psych: pleasant, normal affect Skin: intact   Neurologic: Intact comprehension, needs to use writing tablet to express herself. Cranial nerves II through XII intact, motor strength is 5/5 in right , 0/5 left deltoid, bicep, tricep, grip, hip flexor, knee extensors, ankle dorsiflexor and plantar flexor Sensory exam pt reports some sensation but impaired LUE>LLE Musculoskeletal: Full range of motion in all 4 extremities. No joint swelling   Assessment/Plan: 1. Functional deficits which require 3+ hours per day of interdisciplinary therapy in a comprehensive inpatient rehab setting. Physiatrist is providing close team supervision and 24 hour management of active medical problems listed below. Physiatrist and rehab team continue to assess barriers to discharge/monitor patient progress toward functional and medical goals  Care Tool:  Bathing    Body parts bathed by patient: Chest, Abdomen, Right upper leg, Left upper leg,  Face, Left arm   Body parts bathed by helper: Right arm, Front perineal area, Buttocks, Right lower leg, Left lower leg     Bathing assist Assist Level: Moderate Assistance - Patient 50 - 74%     Upper Body Dressing/Undressing Upper body dressing   What is the patient wearing?: Pull over shirt    Upper body assist Assist Level: Moderate Assistance - Patient 50 - 74%    Lower Body Dressing/Undressing Lower body dressing      What is the patient wearing?: Pants     Lower body assist Assist for lower body dressing: Maximal Assistance - Patient 25 - 49%     Toileting Toileting    Toileting assist Assist for toileting: Maximal Assistance - Patient 25 - 49%     Transfers Chair/bed transfer  Transfers assist  Chair/bed transfer activity did not occur: Safety/medical concerns  Chair/bed transfer assist level: Maximal Assistance - Patient 25 - 49%     Locomotion Ambulation   Ambulation assist   Ambulation activity did not occur: Safety/medical concerns          Walk 10 feet activity   Assist  Walk 10 feet activity did not occur: Safety/medical concerns        Walk 50 feet activity   Assist Walk 50 feet with 2 turns activity did not occur: Safety/medical concerns         Walk 150 feet  activity   Assist Walk 150 feet activity did not occur: Safety/medical concerns         Walk 10 feet on uneven surface  activity   Assist Walk 10 feet on uneven surfaces activity did not occur: Safety/medical concerns         Wheelchair     Assist Is the patient using a wheelchair?: Yes Type of Wheelchair: Manual    Wheelchair assist level: Dependent - Patient 0% Max wheelchair distance: 150    Wheelchair 50 feet with 2 turns activity    Assist        Assist Level: Dependent - Patient 0%   Wheelchair 150 feet activity     Assist      Assist Level: Dependent - Patient 0%   Blood pressure 112/65, pulse 98, temperature 97.9 F  (36.6 C), temperature source Oral, resp. rate 18, weight 59.4 kg, SpO2 100 %.  Medical Problem List and Plan: 1. 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  Antithrombotics: -DVT/anticoagulation:  Pharmaceutical: Lovenox doppler without evidence of DVT              -antiplatelet therapy: continue Plavix and aspirin as PTA 3. Pain Management: tylenol as needed 4. Depression/insomnia: LCSW to evaluate and provide emotional support             -antipsychotic agents: n/a             -bupropion ER 150 mg q AM             -continue mirtazapine 30 mg q HS             -alprazolam 0.5 mg daily prn anxiety 5. Neuropsych/cognition: This patient is capable of making decisions on her own behalf. 6. Skin/Wound Care: routine skin care checks 7. Dysphagia: routine Is and Os and follow-up chemistries             continue Dysphagia 1 diet/thin-SLP eval-  -explained the importance of her nutrition 8: Left-sided weakness/decreased sensation: check LLE venous duplex- not yet performed 9: Hypertension: continue to monitor;  avoid hypotension             -continue Cardizem 180 mg daily   Vitals:   08/10/22 0429 08/10/22 1026  BP: 121/83 112/65  Pulse: 92 98  Resp: 17 18  Temp: 98.3 F (36.8 C) 97.9 F (36.6 C)  SpO2: 100%    Controlled HR borderline tachy  10: Hyperlipidemia: LDL = 125, counsel regarding healthy diet             -continue  ezetimibe 10 mg daily             -continue atorvastatin 90 mg daily 11: Tobacco use: cessation counseling             -continue nicotine patch 21 mg 12. Constipation             -Order sorbitol  -small,hard bm 9/22 13. GERD             -continue PPI  14.   Dysarthria  improving continue SLP 15. Sneezing and coughing: respiratory panel ordered.   LOS: 9 days A FACE TO FACE EVALUATION WAS PERFORMED  Kara Bailey [name] 08/10/2022, 12:52 PM

## 2022-08-10 NOTE — Progress Notes (Signed)
Speech Language Pathology Daily Session Note  Patient Details  Name: Kara Bailey MRN: [id number] Date of Birth: 01/08/1956  Today's Date: 08/10/2022 SLP Individual Time: 5038-8828 SLP Individual Time Calculation (min): 35 min and Today's Date: 08/10/2022 SLP Missed Time: 10 Minutes Missed Time Reason: Patient fatigue;Patient ill (Comment);Other (Comment) (pt exhibiting cold symptoms)  Short Term Goals: Week 2: SLP Short Term Goal 1 (Week 2): Patient will communicate functional wants/needs/wishes/feelings through multimodal communication and min A verbal/visual cues for effectiveness SLP Short Term Goal 2 (Week 2): Patient will approximate production of single syllable words with max A verbal/visual cues to achieve 50% intelligibility SLP Short Term Goal 3 (Week 2): Patient will sustain vowel sound for up 3 seconds during 3 of 5 occasions with max A cues SLP Short Term Goal 4 (Week 2): Patient will utilize low tech communcation supports Passenger transport manager) and/or written communication to convey message with sup A cues for effectiveness SLP Short Term Goal 5 (Week 2): Pt will consume dysphagia 1 textures with thin liquids with improved awareness of bolus/spillage, oral residuals, and without overt s/sx of aspiration with min A to implement swallowing precautions and strategies  Skilled Therapeutic Interventions: Skilled ST treatment focused on speech goals. Pt greeted in wheelchair on arrival. Pt pointed to notebook which stated "I don't feel good today." Pt went on to express that she was experiencing cold symptoms including runny nose, sneezing, congestion, and upset stomach. SLP informed nurse through secure epic chat. Nurse responded acknowledgement in timely manner through epic chat, and stopped by to check on pt during session. Pt used a combination of verbal approximations and responses to yes/no questions with min A for clarity. Vocal intensity and breath support for speech remains  severely deficient with minimal voicing elicited. Pt mainly relied on written and gestural communication and required constant encouragement to initiate verbal communication, as she was not known to initiate this today. Pt requested to go to bathroom and transferred via [place] +2 A. Pt voided bladder, required total A for peri care, donning/doffing pants, and returned to bed. Pt communicated preferences for comfort with head nods following basic yes/no questions. Pt requested for all lights to be turned off and blinds closed. Patient was left in bed with alarm activated and immediate needs within reach at end of session. Nurse at bedside for meds and vitals. Pt missed 10 minutes of session due to not feeling well. Continue per current plan of care.      Pain  Pt endorsed upset stomach. Nurse informed.  Therapy/Group: Individual Therapy  Kara Bailey 08/10/2022, 10:30 AM

## 2022-08-10 NOTE — Progress Notes (Signed)
Occupational Therapy Session Note  Patient Details  Name: Kara Bailey MRN: [id number] Date of Birth: [birth year]  Today's Date: 08/10/2022 OT Individual Time: 3329-5188 OT Individual Time Calculation (min): 14 min  and Today's Date: 08/10/2022 OT Missed Time: 61 Minutes Missed Time Reason: Patient ill (comment) (Pt reporting cold symptoms and currently being tested for COVD. Pt reports increased fatigue and not feeling well with pt declining OT at this time.) - will re-attempt as able to make-up minutes   Short Term Goals: Week 1:  OT Short Term Goal 1 (Week 1): Pt will be able to complete a sq pivot toilet transfer with max A of 1. OT Short Term Goal 2 (Week 1): Pt will be able to rise to stand and hold grab bar with R hand with mod A of 1. OT Short Term Goal 3 (Week 1): Pt will hold static stand with RUE support during LB self care. OT Short Term Goal 4 (Week 1): Pt will don a pullover shirt with min A OT Short Term Goal 5 (Week 1): Pt will demonstrate self management of L arm with positioning with min cues.  Skilled Therapeutic Interventions/Progress Updates:  Pt asleep in bed upon OT arrival to the room. Pt writes "I don't feel good." Pt shakes head no for mobility or any OT tasks. Pt only in agreement for short education.   Therapy Documentation Precautions:  Precautions Precautions: Fall Precaution Comments: L hemi. expressive aphasia Restrictions Weight Bearing Restrictions: No General: General OT Amount of Missed Time: 61 Minutes Vital Signs: Please see "Flowsheet" for most recent vitals charted by nursing staff.  Pain: Pain Assessment Pain Scale: 0-10 Pain Score: 0-No pain  ADL: Pt declines need to perform ADLs at this time.   Therapeutic Activity: Education provided to pt on importance of continuing with LUE NMR tasks such as self-ROM while in bed. OT provides education on importance of repetition. OT provides skilled demo to remind pt of self-ROM techniques for  LUE. Pt begins closing eyes to go to sleep and shakes head "yes" to indicate increased fatigue. Pt reports no other needs at this time, however, continues to report not feeling well.   Pt remained in bed at end of session. Pt left resting comfortably in bed with personal belongings and call light within reach, bed alarm on and activated, be din low position, 3 bed rails up, and comfort needs attended to.    Therapy/Group: Individual Therapy  Barbee Shropshire 08/10/2022, 11:37 AM

## 2022-08-10 NOTE — Progress Notes (Signed)
Occupational Therapy Weekly Progress Note  Patient Details  Name: Kara Bailey MRN: [id number] Date of Birth: 1956-05-01  Beginning of progress report period: August 02, 2022 End of progress report period: August 10, 2022   Patient has met 3 of 5 short term goals.    Patient continues to demonstrate the following deficits: motor apraxia and decreased coordination, decreased visual perceptual skills, and decreased attention to left, left side neglect, and decreased motor planning and therefore will continue to benefit from skilled OT intervention to enhance overall performance with BADL and Reduce care partner burden.  Patient progressing toward long term goals..  Continue plan of care.  OT Short Term Goals Week 1:  OT Short Term Goal 1 (Week 1): Pt will be able to complete a sq pivot toilet transfer with max A of 1. OT Short Term Goal 1 - Progress (Week 1): Met OT Short Term Goal 2 (Week 1): Pt will be able to rise to stand and hold grab bar with R hand with mod A of 1. OT Short Term Goal 2 - Progress (Week 1): Met OT Short Term Goal 3 (Week 1): Pt will hold static stand with RUE support during LB self care. OT Short Term Goal 3 - Progress (Week 1): Not met OT Short Term Goal 4 (Week 1): Pt will don a pullover shirt with min A OT Short Term Goal 4 - Progress (Week 1): Not met OT Short Term Goal 5 (Week 1): Pt will demonstrate self management of L arm with positioning with min cues. OT Short Term Goal 5 - Progress (Week 1): Met Week 2:  OT Short Term Goal 1 (Week 2): Pt will complete SPT to toilet with moderate assist x 1 with use of LRAD as needed. OT Short Term Goal 2 (Week 2): Pt will don shirt with minimal assistance with moderate prompting for use of hemi-techniques. OT Short Term Goal 3 (Week 2): Pt will perform LB dressing with moderate assistance x 1 using hemi-dressing techniques as well. OT Short Term Goal 4 (Week 2): Pt will complete toileting tasks with moderate  assistance x 1. OT Short Term Goal 5 (Week 2): Pt will return demo self-ROM techniques with independence on 2 sessions to demo recall and understanding of LUE NMR.  Barbee Shropshire 08/10/2022, 11:41 AM

## 2022-08-11 NOTE — Progress Notes (Signed)
Speech Language Pathology Daily Session Note  Patient Details  Name: Kara Bailey MRN: [id number] Date of Birth: Nov 07, 1956  Today's Date: 08/11/2022 SLP Individual Time: 6333-5456 SLP Individual Time Calculation (min): 41 min  Short Term Goals: Week 2: SLP Short Term Goal 1 (Week 2): Patient will communicate functional wants/needs/wishes/feelings through multimodal communication and min A verbal/visual cues for effectiveness SLP Short Term Goal 2 (Week 2): Patient will approximate production of single syllable words with max A verbal/visual cues to achieve 50% intelligibility SLP Short Term Goal 3 (Week 2): Patient will sustain vowel sound for up 3 seconds during 3 of 5 occasions with max A cues SLP Short Term Goal 4 (Week 2): Patient will utilize low tech communcation supports Passenger transport manager) and/or written communication to convey message with sup A cues for effectiveness SLP Short Term Goal 5 (Week 2): Pt will consume dysphagia 1 textures with thin liquids with improved awareness of bolus/spillage, oral residuals, and without overt s/sx of aspiration with min A to implement swallowing precautions and strategies  Skilled Therapeutic Interventions: Skilled ST treatment focused on speech goals. Pt greeted in bed and reported feeling better today with minimal cold symptoms. Pt complained of constipation which was communicated through writing on dry erase board. SLP informed nurse.   SLP facilitated breath support techniques using a variety of techniques with max A multimodal cues including: - Straw inhalation. Pt instructed to inhale through a straw in order to generate suction at the end of straw with small piece of tissue as visual feedback. Pt able to generate adequate suction during 3 of 20 occasions and sustain breath for ~1 second duration. Pt exhibited significant difficulty coordinating inhale vs. exhale. Decreased labial seal likely contributed to difficulty.  - Use of IMT  device. Although unable to generate resistance with device, it appeared inhaling through device improved coordination of inhale vs. exhale to achieve greater respiratory volume which directly correlated to improved breath support and vocal intensity at the phoneme and single syllable word level (I.e., "hey"). Pt sustained "ah" for average of 2 seconds during 2/6 occasions. Sustained phonation interrupted by moments of aphonia. Pt also elicited "thank you" and "I will surprise you" (pt wants to surprise ST with her improvements when SLP returns from being off Sun and Mon) with 50-75% intelligibility. Pt appeared highly motivated by gains made during session.   Patient was left in bed with alarm activated and immediate needs within reach at end of session. Continue per current plan of care.      Pain  None/denied  Therapy/Group: Individual Therapy  Patty Sermons 08/11/2022, 9:19 AM

## 2022-08-11 NOTE — Progress Notes (Signed)
Physical Therapy Session Note  Patient Details  Name: Kara Bailey MRN: [id number] Date of Birth: 07/08/1956  Today's Date: 08/11/2022 PT Individual Time: 229 440 2195 and 1105-1200 PT Individual Time Calculation (min): 9 min and 55 min   Short Term Goals: Week 1:  PT Short Term Goal 1 (Week 1): Pt will perform bed mobility with max assist of 1 consistently PT Short Term Goal 1 - Progress (Week 1): Met PT Short Term Goal 2 (Week 1): Pt will sit EOB with CGA >3 mintues PT Short Term Goal 2 - Progress (Week 1): Met PT Short Term Goal 3 (Week 1): Pt will propell WC >100with min assist PT Short Term Goal 3 - Progress (Week 1): Met PT Short Term Goal 4 (Week 1): Pt will tolerate standing >1 min with max assist from PT PT Short Term Goal 4 - Progress (Week 1): Met Week 2:  PT Short Term Goal 1 (Week 2): pt will perform bed mobiltiy with mod assist PT Short Term Goal 2 (Week 2): Pt will transfer to [facility] with mod assist PT Short Term Goal 3 (Week 2): Pt will propell WC 188f with min assist PT Short Term Goal 4 (Week 2): Pt will perform sit<>stand with mod assist consistently Week 3:     Skilled Therapeutic Interventions/Progress Updates:     Pt received supine in bed and agreeable to PT. Supine>sit transfer with *** assist and ***cues   Rolling R and L with mod-max assist to doff soiled brief and don clean. Donning pant in supine.   [facility]  Donning shoes sitting in WC.   Sit<>stand from [place]   Gait training and pregait   WC mobility with supervision assist-min assist from PT for safety and direction.   [facility] returned to [facility] and [facility] and all needs met.     Session 2.   Family present in room. Education provided on PT recommendations for need for assist at d/c for min assist transfers and ambulating with PT only to reduce fall risk. Education and encouragement for ramp at d/c need for WC and hosiptal bed as well as stand  assist lift. Family verbalized understanding.   Therapy Documentation Precautions:  Precautions Precautions: Fall Precaution Comments: L hemi. expressive aphasia Restrictions Weight Bearing Restrictions: No General:   Vital Signs:   Pain:   Mobility:   Locomotion :    Trunk/Postural Assessment :    Balance:   Exercises:   Other Treatments:      Therapy/Group: Individual Therapy  [name]9/30/2023, 5:17 PM

## 2022-08-12 MED ORDER — SENNOSIDES-DOCUSATE SODIUM 8.6-50 MG PO TABS
1.0000 | ORAL_TABLET | Freq: Every day | ORAL | Status: DC
  Administered 2022-08-12 – 2022-08-19 (×7): 1 via ORAL
  Filled 2022-08-12 (×8): qty 1

## 2022-08-12 MED ORDER — POLYETHYLENE GLYCOL 3350 17 G PO PACK: 17.0000 g | PACK | Freq: Every day | ORAL | Status: DC

## 2022-08-12 MED ORDER — POLYETHYLENE GLYCOL 3350 17 G PO PACK
17.0000 g | PACK | Freq: Two times a day (BID) | ORAL | Status: DC
  Administered 2022-08-12 – 2022-09-06 (×23): 17 g via ORAL
  Filled 2022-08-12 (×48): qty 1

## 2022-08-12 NOTE — Progress Notes (Signed)
PROGRESS NOTE   Subjective/Complaints: Patient seen sitting up at bedside. Communicates with dry erase board appropriately. Complains of constipation, is passing flatus and having small/smear BMs. Endorses not eating well because "food just gross". No other concerns at this time.    ROS: + constipation. No SOB, cough, chest pain. Limited d/t aphasia.   Objective:   No results found. No results for input(s): "WBC", "HGB", "HCT", "PLT" in the last 72 hours. No results for input(s): "NA", "K", "CL", "CO2", "GLUCOSE", "BUN", "CREATININE", "CALCIUM" in the last 72 hours.  Intake/Output Summary (Last 24 hours) at 08/12/2022 2127 Last data filed at 08/12/2022 1817 Gross per 24 hour  Intake 360 ml  Output --  Net 360 ml         Physical Exam: Vital Signs Blood pressure 117/75, pulse 95, temperature 98 F (36.7 C), resp. rate 18, weight 59.4 kg, SpO2 100 %.   Gen: no distress, normal appearing HEENT: oral mucosa pink and moist, NCAT,  Cardio: Reg rate and rhythm. No murmurs, rubs, or gallops.  Chest: normal effort, normal rate of breathing, CTAB, no rales/rhonchi/wheezing.  Abd: soft, non-distended Ext: no edema Psych: pleasant, normal affect Skin: intact Neurologic: Intact comprehension, needs to use writing tablet to express herself. Cranial nerves II through XII intact. LUE flaccid.   PE from prior encounter:  motor strength is 5/5 in right , 0/5 left deltoid, bicep, tricep, grip, hip flexor, knee extensors, ankle dorsiflexor and plantar flexor Sensory exam pt reports some sensation but impaired LUE>LLE Musculoskeletal: Full range of motion in all 4 extremities. No joint swelling   Assessment/Plan: 1. Functional deficits which require 3+ hours per day of interdisciplinary therapy in a comprehensive inpatient rehab setting. Physiatrist is providing close team supervision and 24 hour management of active medical problems  listed below. Physiatrist and rehab team continue to assess barriers to discharge/monitor patient progress toward functional and medical goals  Care Tool:  Bathing    Body parts bathed by patient: Chest, Abdomen, Right upper leg, Left upper leg, Face, Left arm   Body parts bathed by helper: Right arm, Front perineal area, Buttocks, Right lower leg, Left lower leg     Bathing assist Assist Level: Moderate Assistance - Patient 50 - 74%     Upper Body Dressing/Undressing Upper body dressing   What is the patient wearing?: Pull over shirt    Upper body assist Assist Level: Moderate Assistance - Patient 50 - 74%    Lower Body Dressing/Undressing Lower body dressing      What is the patient wearing?: Pants     Lower body assist Assist for lower body dressing: Maximal Assistance - Patient 25 - 49%     Toileting Toileting    Toileting assist Assist for toileting: Maximal Assistance - Patient 25 - 49%     Transfers Chair/bed transfer  Transfers assist  Chair/bed transfer activity did not occur: Safety/medical concerns  Chair/bed transfer assist level: Maximal Assistance - Patient 25 - 49%     Locomotion Ambulation   Ambulation assist   Ambulation activity did not occur: Safety/medical concerns          Walk 10 feet activity  Assist  Walk 10 feet activity did not occur: Safety/medical concerns        Walk 50 feet activity   Assist Walk 50 feet with 2 turns activity did not occur: Safety/medical concerns         Walk 150 feet activity   Assist Walk 150 feet activity did not occur: Safety/medical concerns         Walk 10 feet on uneven surface  activity   Assist Walk 10 feet on uneven surfaces activity did not occur: Safety/medical concerns         Wheelchair     Assist Is the patient using a wheelchair?: Yes Type of Wheelchair: Manual    Wheelchair assist level: Dependent - Patient 0% Max wheelchair distance: 150     Wheelchair 50 feet with 2 turns activity    Assist        Assist Level: Dependent - Patient 0%   Wheelchair 150 feet activity     Assist      Assist Level: Dependent - Patient 0%   Blood pressure 117/75, pulse 95, temperature 98 F (36.7 C), resp. rate 18, weight 59.4 kg, SpO2 100 %.  Medical Problem List and Plan: 1. 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  Antithrombotics: -DVT/anticoagulation:  Pharmaceutical: Lovenox doppler without evidence of DVT              -antiplatelet therapy: continue Plavix and aspirin as PTA 3. Pain Management: tylenol as needed 4. Depression/insomnia: LCSW to evaluate and provide emotional support             -antipsychotic agents: n/a             -bupropion ER 150 mg q AM             -continue mirtazapine 30 mg q HS             -alprazolam 0.5 mg daily prn anxiety 5. Neuropsych/cognition: This patient is capable of making decisions on her own behalf. 6. Skin/Wound Care: routine skin care checks 7. Dysphagia: routine Is and Os and follow-up chemistries             continue Dysphagia 1 diet/thin-SLP eval-  -explained the importance of her nutrition 8: Left-sided weakness/decreased sensation: check LLE venous duplex- not yet performed 9: Hypertension: continue to monitor;  avoid hypotension             -continue Cardizem 180 mg daily   Vitals:   08/12/22 1312 08/12/22 1945  BP: 125/69 117/75  Pulse: 93 95  Resp: 16 18  Temp: 97.7 F (36.5 C) 98 F (36.7 C)  SpO2: 100% 100%   Controlled HR borderline tachy  - 10/1 - WNL, monitor  10: Hyperlipidemia: LDL = 125, counsel regarding healthy diet             -continue   ezetimibe 10 mg daily             -continue atorvastatin 90 mg daily 11: Tobacco use: cessation counseling             -  continue nicotine patch 21 mg 12. Constipation             -Order sorbitol  -small,hard bm 9/22             - 10/1 - appears all BMS small or smears since admission. Added BID miralax, PM sennakot-S scheduled.   13. GERD             -continue PPI 14.  Dysarthria improving continue SLP 15. Sneezing and coughing: respiratory panel ordered.  - 10/1 - RVP negative, no further complaints  LOS: 11 days A FACE TO FACE EVALUATION WAS PERFORMED  Gertie Gowda 08/12/2022, 9:27 PM

## 2022-08-13 LAB — CBC
HCT: 30.8 % — ABNORMAL LOW (ref 36.0–46.0)
Hemoglobin: 10.4 g/dL — ABNORMAL LOW (ref 12.0–15.0)
MCH: 30.3 pg (ref 26.0–34.0)
MCHC: 33.8 g/dL (ref 30.0–36.0)
MCV: 89.8 fL (ref 80.0–100.0)
Platelets: 364 10*3/uL (ref 150–400)
RBC: 3.43 MIL/uL — ABNORMAL LOW (ref 3.87–5.11)
RDW: 13.4 % (ref 11.5–15.5)
WBC: 9.3 10*3/uL (ref 4.0–10.5)
nRBC: 0 % (ref 0.0–0.2)

## 2022-08-13 LAB — BASIC METABOLIC PANEL
Anion gap: 9 (ref 5–15)
BUN: 17 mg/dL (ref 8–23)
CO2: 22 mmol/L (ref 22–32)
Calcium: 9.3 mg/dL (ref 8.9–10.3)
Chloride: 107 mmol/L (ref 98–111)
Creatinine, Ser: 0.98 mg/dL (ref 0.44–1.00)
GFR, Estimated: >60 mL/min (ref >60)
Glucose, Bld: 89 mg/dL (ref 70–99)
Potassium: 4 mmol/L (ref 3.5–5.1)
Sodium: 138 mmol/L (ref 135–145)

## 2022-08-13 MED ORDER — TIZANIDINE HCL 4 MG PO TABS
2.0000 mg | ORAL_TABLET | Freq: Three times a day (TID) | ORAL | Status: DC | PRN
  Administered 2022-08-15 – 2022-08-22 (×7): 2 mg via ORAL
  Filled 2022-08-13 (×7): qty 1

## 2022-08-13 NOTE — Progress Notes (Signed)
Physical Therapy Session Note  Patient Details  Name: Kara Bailey MRN: [id number] Date of Birth: [birth year]  Today's Date: 08/13/2022 PT Individual Time: 1117-1205 PT Individual Time Calculation (min): 48 min   Short Term Goals: Week 1:  PT Short Term Goal 1 (Week 1): Pt will perform bed mobility with max assist of 1 consistently PT Short Term Goal 1 - Progress (Week 1): Met PT Short Term Goal 2 (Week 1): Pt will sit EOB with CGA >3 mintues PT Short Term Goal 2 - Progress (Week 1): Met PT Short Term Goal 3 (Week 1): Pt will propell WC >100with min assist PT Short Term Goal 3 - Progress (Week 1): Met PT Short Term Goal 4 (Week 1): Pt will tolerate standing >1 min with max assist from PT PT Short Term Goal 4 - Progress (Week 1): Met Week 2:  PT Short Term Goal 1 (Week 2): pt will perform bed mobiltiy with mod assist PT Short Term Goal 2 (Week 2): Pt will transfer to [facility] with mod assist PT Short Term Goal 3 (Week 2): Pt will propell WC 184f with min assist PT Short Term Goal 4 (Week 2): Pt will perform sit<>stand with mod assist consistently  Skilled Therapeutic Interventions/Progress Updates:  Patient seated upright in w/c on entrance to room. Patient alert and agreeable to PT session.   Patient with no pain complaint at start of session.  Therapeutic Activity: Transfers: Pt performed sit<>stand transfers throughout session with MinA with UE support. Provided verbal cues for positioning, full upright posture, level gaze.  Gait Training:  Pt already dressed and has Sprystep Max GRAFO donned to LLE. Pt ambulated 38 ft using R HR in hallway with MaxA  and continued need for max block to L knee to prevent buckle AND hyperextension. Demonstrated ability to extend trunk and move L pelvis anteriorly in order to attempt to facilitate forward step with L foot.   Second bout with pt covering 38 ft at hallway HR and then picking up HThayerand continuing for 13 more feet.   Provided vc/ tc  throughout for sequencing and technique.  Neuromuscular Re-ed: NMR facilitated during session with focus on standing balance. Pt guided in upright posture, level head gaze, midline orientation. NMR performed for improvements in motor control and coordination, balance, sequencing, judgement, and self confidence/ efficacy in performing all aspects of mobility at highest level of independence.   Patient seated upright in w/c at end of session with brakes locked, belt alarm set, and all needs within reach.  Therapy Documentation Precautions:  Precautions Precautions: Fall Precaution Comments: L hemi. expressive aphasia Restrictions Weight Bearing Restrictions: No General:   Vital Signs: Therapy Vitals Temp: 98.3 F (36.8 C) Pulse Rate: 97 Resp: 17 BP: 136/67 Patient Position (if appropriate): Sitting Oxygen Therapy SpO2: 99 % O2 Device: Room Air Pain: No pain indicated this session.   Therapy/Group: Individual Therapy  JAlger SimonsPT, DPT, CSRS 08/13/2022, 6:14 PM

## 2022-08-13 NOTE — Progress Notes (Signed)
Occupational Therapy Session Note  Patient Details  Name: Kara Bailey MRN: [id number] Date of Birth: [birth year]  Today's Date: 08/13/2022 OT Individual Time: 2446-2863 OT Individual Time Calculation (min): 60 min    Short Term Goals: Week 1:  OT Short Term Goal 1 (Week 1): Pt will be able to complete a sq pivot toilet transfer with max A of 1. OT Short Term Goal 1 - Progress (Week 1): Met OT Short Term Goal 2 (Week 1): Pt will be able to rise to stand and hold grab bar with R hand with mod A of 1. OT Short Term Goal 2 - Progress (Week 1): Met OT Short Term Goal 3 (Week 1): Pt will hold static stand with RUE support during LB self care. OT Short Term Goal 3 - Progress (Week 1): Not met OT Short Term Goal 4 (Week 1): Pt will don a pullover shirt with min A OT Short Term Goal 4 - Progress (Week 1): Not met OT Short Term Goal 5 (Week 1): Pt will demonstrate self management of L arm with positioning with min cues. OT Short Term Goal 5 - Progress (Week 1): Met Week 2:  OT Short Term Goal 1 (Week 2): Pt will complete SPT to toilet with moderate assist x 1 with use of LRAD as needed. OT Short Term Goal 2 (Week 2): Pt will don shirt with minimal assistance with moderate prompting for use of hemi-techniques. OT Short Term Goal 3 (Week 2): Pt will perform LB dressing with moderate assistance x 1 using hemi-dressing techniques as well. OT Short Term Goal 4 (Week 2): Pt will complete toileting tasks with moderate assistance x 1. OT Short Term Goal 5 (Week 2): Pt will return demo self-ROM techniques with independence on 2 sessions to demo recall and understanding of LUE NMR.  Skilled Therapeutic Interventions/Progress Updates:    Pt received in w/c dressed and ready for therapy.  Pt taken to therapy gym and worked on squat pivot transfer to her R side with max A and facilitation for forward wt shift to lift hips.   On mat worked on LUE wt bearing (pt initially had increased tone) with focus on  upright posture to retract scapula with A as pt has trace movement in scapula and no movement in arm.  Worked on forward wt shifts with hip lift to prep for sit to stand and transfers.   LUE PROM with rolling hand on basketball. Using small rotations of arm, her tone did start to decrease.  Pt moved to supine to ensure her spine was straight and scapula in alignment for gentle sh PROM. Pt continues to have good external rotation.  Tried hand drop activity to stimulate triceps with reflex response. No response.   Pt rolled to L to work on sidelying to sit with min A and guiding for rotation of upperbody.  Pt did a squat pivot to her L with only mod A using 3-4 small scoots.  Pt set up in wc with lap tray and all needs met. Resting in wc with belt alarm on.   Therapy Documentation Precautions:  Precautions Precautions: Fall Precaution Comments: L hemi. expressive aphasia Restrictions Weight Bearing Restrictions: No       Pain: Pain Assessment Pain Score: 0-No pain      Therapy/Group: Individual Therapy  Rena Sweeden 08/13/2022, 12:41 PM

## 2022-08-13 NOTE — Progress Notes (Signed)
Occupational Therapy Session Note  Patient Details  Name: Kara Bailey MRN: [id number] Date of Birth: 07/15/1956  Today's Date: 08/13/2022 OT Individual Time: 1300-1400 OT Individual Time Calculation (min): 60 min    Short Term Goals: Week 2:  OT Short Term Goal 1 (Week 2): Pt will complete SPT to toilet with moderate assist x 1 with use of LRAD as needed. OT Short Term Goal 2 (Week 2): Pt will don shirt with minimal assistance with moderate prompting for use of hemi-techniques. OT Short Term Goal 3 (Week 2): Pt will perform LB dressing with moderate assistance x 1 using hemi-dressing techniques as well. OT Short Term Goal 4 (Week 2): Pt will complete toileting tasks with moderate assistance x 1. OT Short Term Goal 5 (Week 2): Pt will return demo self-ROM techniques with independence on 2 sessions to demo recall and understanding of LUE NMR.  Skilled Therapeutic Interventions/Progress Updates:    Upon OT arrival, pt seated in w/c reporting no pain and is agreeable to OT treatment. Treatment intervention with a focus on visual perception, dynamic standing balance, standing tolerance, and UE ROM exercises. Pt was transported to ortho gym via w/c and total A for time management  While seated in w/c, pt retrieves squigz from [employer] using the R UE based on the color this therapist says and returns them to the container located to the L of pt. Pt able to completes task without difficulty and no errors. Pt able to retrieve squigz in all planes. Pt was transported to ADL apartment via w/c and total A and performs sorting task while standing at countertop. Pt completes 3 sit to stand transfers with Mod A and stands with Mod A to sort silverware and dishware into container and cabinets requiring verbal and tactile cues to correct posture and shift weight to R LE. Pt does speak minimally with increased time. Pt requires Max A for stand to sit transfers. Pt fatigued at end of task requiring rest break.  Pt was transported to dayroom gym via w/c and total A for time and stands with mirror placed infront of pt for visual input. Pt completes sit to stand transfer with Mod A and stands with Mod A for ~5 minutes with R UE support on rail on wall. Pt does well with mirror input. Pt returns to sitting in w/c with Max A and performs self ROM including shoulder flex/ext, elbow flex.ext, and shoulder elevation/depress for 2x10 reps and mirror placed infornt of pt for visual input. Pt tolerates exercises well. Pt was returned to her room via w/c and total A and left in w/c at end of session with all needs met.   Therapy Documentation Precautions:  Precautions Precautions: Fall Precaution Comments: L hemi. expressive aphasia Restrictions Weight Bearing Restrictions: No    Therapy/Group: Individual Therapy  [name] 08/13/2022, 4:17 PM

## 2022-08-13 NOTE — Progress Notes (Signed)
PROGRESS NOTE   Subjective/Complaints:  No issues overnite , seen in OT, smiling, can speak a few words  ROS: + constipation. No SOB, cough, chest pain. Limited d/t aphasia.   Objective:   No results found. Recent Labs    08/13/22 0650  WBC 9.3  HGB 10.4*  HCT 30.8*  PLT 364   Recent Labs    08/13/22 0650  NA 138  K 4.0  CL 107  CO2 22  GLUCOSE 89  BUN 17  CREATININE 0.98  CALCIUM 9.3    Intake/Output Summary (Last 24 hours) at 08/13/2022 0926 Last data filed at 08/12/2022 1817 Gross per 24 hour  Intake 240 ml  Output --  Net 240 ml         Physical Exam: Vital Signs Blood pressure 121/72, pulse 98, temperature 98.2 F (36.8 C), temperature source Oral, resp. rate 20, weight 59.4 kg, SpO2 100 %.  General: No acute distress Mood and affect are appropriate Heart: Regular rate and rhythm no rubs murmurs or extra sounds Lungs: Clear to auscultation, breathing unlabored, no rales or wheezes Abdomen: Positive bowel sounds, soft nontender to palpation, nondistended Extremities: No clubbing, cyanosis, or edema Skin: No evidence of breakdown, no evidence of rash   Neurologic: Intact comprehension, needs to use writing tablet to express herself. Cranial nerves II through XII intact. LUE flaccid.   PE from prior encounter:  motor strength is 5/5 in right , 0/5 left deltoid, bicep, tricep, grip, hip flexor, knee extensors, ankle dorsiflexor and plantar flexor Sensory exam pt reports some sensation but impaired LUE>LLE Musculoskeletal: Full range of motion in all 4 extremities. No joint swelling   Assessment/Plan: 1. Functional deficits which require 3+ hours per day of interdisciplinary therapy in a comprehensive inpatient rehab setting. Physiatrist is providing close team supervision and 24 hour management of active medical problems listed below. Physiatrist and rehab team continue to assess barriers to  discharge/monitor patient progress toward functional and medical goals  Care Tool:  Bathing    Body parts bathed by patient: Chest, Abdomen, Right upper leg, Left upper leg, Face, Left arm   Body parts bathed by helper: Right arm, Front perineal area, Buttocks, Right lower leg, Left lower leg     Bathing assist Assist Level: Moderate Assistance - Patient 50 - 74%     Upper Body Dressing/Undressing Upper body dressing   What is the patient wearing?: Pull over shirt    Upper body assist Assist Level: Moderate Assistance - Patient 50 - 74%    Lower Body Dressing/Undressing Lower body dressing      What is the patient wearing?: Pants     Lower body assist Assist for lower body dressing: Maximal Assistance - Patient 25 - 49%     Toileting Toileting    Toileting assist Assist for toileting: Maximal Assistance - Patient 25 - 49%     Transfers Chair/bed transfer  Transfers assist  Chair/bed transfer activity did not occur: Safety/medical concerns  Chair/bed transfer assist level: Maximal Assistance - Patient 25 - 49%     Locomotion Ambulation   Ambulation assist   Ambulation activity did not occur: Safety/medical concerns  Walk 10 feet activity   Assist  Walk 10 feet activity did not occur: Safety/medical concerns        Walk 50 feet activity   Assist Walk 50 feet with 2 turns activity did not occur: Safety/medical concerns         Walk 150 feet activity   Assist Walk 150 feet activity did not occur: Safety/medical concerns         Walk 10 feet on uneven surface  activity   Assist Walk 10 feet on uneven surfaces activity did not occur: Safety/medical concerns         Wheelchair     Assist Is the patient using a wheelchair?: Yes Type of Wheelchair: Manual    Wheelchair assist level: Dependent - Patient 0% Max wheelchair distance: 150    Wheelchair 50 feet with 2 turns activity    Assist        Assist  Level: Dependent - Patient 0%   Wheelchair 150 feet activity     Assist      Assist Level: Dependent - Patient 0%   Blood pressure 121/72, pulse 98, temperature 98.2 F (36.8 C), temperature source Oral, resp. rate 20, weight 59.4 kg, SpO2 100 %.  Medical Problem List and Plan: 1. 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  Antithrombotics: -DVT/anticoagulation:  Pharmaceutical: cont Lovenox , doppler without evidence of DVT              -antiplatelet therapy: continue Plavix and aspirin as PTA 3. Pain Management: tylenol as needed 4. Depression/insomnia: LCSW to evaluate and provide emotional support             -antipsychotic agents: n/a             -bupropion ER 150 mg q AM             -continue mirtazapine 30 mg q HS             -alprazolam 0.5 mg daily prn anxiety 5. Neuropsych/cognition: This patient is capable of making decisions on her own behalf. 6. Skin/Wound Care: routine skin care checks 7. Dysphagia: routine Is and Os and follow-up chemistries             continue Dysphagia 1 diet/thin-SLP eval-  -explained the importance of her nutrition  9: Hypertension: continue to monitor;  avoid hypotension             -continue Cardizem 180 mg daily   Vitals:   08/12/22 1945 08/13/22 0541  BP: 117/75 121/72  Pulse: 95 98  Resp: 18 20  Temp: 98 F (36.7 C) 98.2 F (36.8 C)  SpO2: 100% 100%   Controlled HR borderline tachy  - 10/1 - WNL, monitor  10: Hyperlipidemia: LDL = 125, counsel regarding healthy diet             -continue  ezetimibe 10 mg daily             -continue atorvastatin 90 mg daily 11: Tobacco use: cessation counseling             -  continue nicotine  patch 21 mg 12. Constipation             -Order sorbitol  -small,hard bm 9/22             - 10/1 - appears all BMS small or smears since admission. Added BID miralax, PM sennakot-S scheduled.   13. GERD             -continue PPI 14.  Dysarthria improving continue SLP 15. Sneezing and coughing: respiratory panel negative- appears better today , ? Allergy    LOS: 12 days A FACE TO FACE EVALUATION WAS PERFORMED  Charlett Blake 08/13/2022, 9:26 AM

## 2022-08-13 NOTE — Progress Notes (Signed)
Physical Therapy Session Note  Patient Details  Name: Kara Bailey MRN: [id number] Date of Birth: 12/23/1955  Today's Date: 08/13/2022 PT Individual Time: 0800-0830 PT Individual Time Calculation (min): 30 min   Short Term Goals: Week 2:  PT Short Term Goal 1 (Week 2): pt will perform bed mobiltiy with mod assist PT Short Term Goal 2 (Week 2): Pt will transfer to [facility] with mod assist PT Short Term Goal 3 (Week 2): Pt will propell WC 161f with min assist PT Short Term Goal 4 (Week 2): Pt will perform sit<>stand with mod assist consistently  Skilled Therapeutic Interventions/Progress Updates: Pt presents supine in bed and agreeable to therapy.  Pt required clean brief and pull-up threaded over B feet and then pt given washcloth w/ spray to clean periarea.  Pt then able to bridge when assisted w/ L knee into hooklying and manual cues for maintaining position to remove soiled brief.  Pt bridged again for total A to don pull up although unable to complete.  Pt rolls side to side w/ mod A for completion over hips.  Pt transferred L sidelying to sit w/ mod A and able to maintain sitting balance w/ supervision.  PT assisted w/ pull over shirt and then threading pants over feet.  Pt transferred sit to stand w/ mod A and blocking L knee and then total A for pulling pants over hips.  Pt performed SPT w/ max A to w/c to R.  Pt remained sitting in w/c w/ all needs in reach and chair alarm on.     Therapy Documentation Precautions:  Precautions Precautions: Fall Precaution Comments: L hemi. expressive aphasia Restrictions Weight Bearing Restrictions: No General:   Vital Signs: Therapy Vitals Temp: 98.2 F (36.8 C) Temp Source: Oral Pulse Rate: 98 Resp: 20 BP: 121/72 Patient Position (if appropriate): Lying Oxygen Therapy SpO2: 100 % O2 Device: Room Air Pain:0/10      Therapy/Group: Individual Therapy  JLadoris Gene10/12/2021, 8:34 AM

## 2022-08-14 NOTE — Progress Notes (Signed)
Patient ID: Kara Bailey, female   DOB: 05-Jul-1956, 66 y.o.   MRN: [id number]  Family education scheduled 10/11-13 1-4 PM

## 2022-08-14 NOTE — Progress Notes (Signed)
Patient ID: Kara Bailey, female   DOB: December 26, 1955, 66 y.o.   MRN: [id number]  Sw left patient son [name], [place] to discuss family education.

## 2022-08-14 NOTE — Progress Notes (Signed)
Physical Therapy Session Note  Patient Details  Name: Kara Bailey MRN: [id number] Date of Birth: 1956/06/15  Today's Date: 08/14/2022 PT Individual Time: 0905-1010 PT Individual Time Calculation (min): 65 min   Short Term Goals: Week 2:  PT Short Term Goal 1 (Week 2): pt will perform bed mobiltiy with mod assist PT Short Term Goal 2 (Week 2): Pt will transfer to [facility] with mod assist PT Short Term Goal 3 (Week 2): Pt will propell WC 146f with min assist PT Short Term Goal 4 (Week 2): Pt will perform sit<>stand with mod assist consistently Week 3:     Skilled Therapeutic Interventions/Progress Updates:  Patient supine in bed on entrance to room. Patient alert and agreeable to PT session.   Patient with no pain complaint at start of session.  Therapeutic Activity/ Attended E-stim/ NMR: Positioned with pillows for SAQ performance with NMES. [place] NMES set up for quad activation using muscle atrophy setting with amplitude set at 29 mA for muscle activation. AAROM performed with pt demonstrating increased hip flexion with knee extension. Runtime = 182m. Following estim, pt demos 2-/ 5 activation for minimal hip flexion prior to fatigue. No redness or adverse skin reaction to estim treatment.  Bed Mobility: Pt performed supine --> sit with MinA for UB and LLE off EOB. VC/ tc required for technique and sequence. While seated EOB, pt demos LOB to L side  and sometimes posteriorly with Mod/ Max A for dressing.  Transfers: Pt performed stand pivot transfer from bed>w/c using HW with MaxA for LLE progression and positioning. Sit<>stand with Min to [place] balance and reaching full upright posture to HW. VC for technique.   Gait Training:  Pt ambulated 38 ft using R HR with max A for LLE advancement. . Demonstrated Improved trunk and abd activation for improved L hip forward progression with improved trunk and abdominal contraction for hip forward to assist with LLE advancement. Provided vc/  tc for sequencing and technique.  Wheelchair Mobility:  Pt propelled wheelchair 85 feet with hemitechnique and overall supervision. Provided vc for obstacles and min cues for technique to avoid.  Patient seated upright at end of session with brakes locked, belt alarm set, and all needs within reach.   Therapy Documentation Precautions:  Precautions Precautions: Fall Precaution Comments: L hemi. expressive aphasia Restrictions Weight Bearing Restrictions: No General:   Vital Signs: Therapy Vitals Temp: 97.8 F (36.6 C) Temp Source: Oral Pulse Rate: 100 Resp: 17 BP: (!) 148/84 Patient Position (if appropriate): Sitting Oxygen Therapy SpO2: 100 % O2 Device: Room Air Pain: Pain Assessment Pain Scale: Faces Pain Score: 0-No pain Faces Pain Scale: No hurt  Therapy/Group: Individual Therapy  JuAlger SimonsT, DPT, CSRS 08/14/2022, 12:58 PM

## 2022-08-14 NOTE — Progress Notes (Signed)
Speech Language Pathology Daily Session Note  Patient Details  Name: Kara Bailey MRN: [id number] Date of Birth: 10-07-1956  Today's Date: 08/14/2022 SLP Individual Time: 1751-0258 SLP Individual Time Calculation (min): 42 min  Short Term Goals: Week 2: SLP Short Term Goal 1 (Week 2): Patient will communicate functional wants/needs/wishes/feelings through multimodal communication and min A verbal/visual cues for effectiveness SLP Short Term Goal 2 (Week 2): Patient will approximate production of single syllable words with max A verbal/visual cues to achieve 50% intelligibility SLP Short Term Goal 3 (Week 2): Patient will sustain vowel sound for up 3 seconds during 3 of 5 occasions with max A cues SLP Short Term Goal 4 (Week 2): Patient will utilize low tech communcation supports Passenger transport manager) and/or written communication to convey message with sup A cues for effectiveness SLP Short Term Goal 5 (Week 2): Pt will consume dysphagia 1 textures with thin liquids with improved awareness of bolus/spillage, oral residuals, and without overt s/sx of aspiration with min A to implement swallowing precautions and strategies  Skilled Therapeutic Interventions: Skilled ST treatment focused on swallowing and speech goals. Pt greeted supine in bed on arrival. Pt pleasant and cooperative throughout session. NT informed SLP that pt had declined breakfast and was tearful this morning prior to SLP session. Pt agreeable to dysphagia 2 PO trials with graham cracker mixed into vanilla pudding. Pt consumed with improved mastication effectiveness, improved oral control with only minimal anterior spillage on left, minimal oral residuals, and without overt s/sx of aspiration. Pt implemented safe swallowing compensatory strategies with sup A verbal cues. Based on these improvements, SLP recommends diet advancement to dysphagia 2 textures and thin liquids. Continue crushed medications. Pt verbalized agreement with diet  advancement.   From a communication standpoint, pt required mod A verbal encouragement cues to verbalize responses, as she primarily communicated through non-verbal means if not directly prompted by SLP. Pt was able to elicit responses the word level with 25-50% intelligibly with mod A to implement breath support techniques. Pt communicated needs through multimodal means with min A for effectiveness. Patient was left in bed with alarm activated and immediate needs within reach at end of session. Notebook and dry erase board were left within reach to support multimodal means of communication. Continue per current plan of care.       Pain  None.denied  Therapy/Group: Individual Therapy  Patty Sermons 08/14/2022, 8:36 AM

## 2022-08-14 NOTE — Progress Notes (Signed)
Occupational Therapy Session Note  Patient Details  Name: Kara Bailey MRN: [id number] Date of Birth: 07/31/1956  Today's Date: 08/14/2022 OT Individual Time: 3785-8850 OT Individual Time Calculation (min): 60 min    Short Term Goals: Week 2:  OT Short Term Goal 1 (Week 2): Pt will complete SPT to toilet with moderate assist x 1 with use of LRAD as needed. OT Short Term Goal 2 (Week 2): Pt will don shirt with minimal assistance with moderate prompting for use of hemi-techniques. OT Short Term Goal 3 (Week 2): Pt will perform LB dressing with moderate assistance x 1 using hemi-dressing techniques as well. OT Short Term Goal 4 (Week 2): Pt will complete toileting tasks with moderate assistance x 1. OT Short Term Goal 5 (Week 2): Pt will return demo self-ROM techniques with independence on 2 sessions to demo recall and understanding of LUE NMR.  Skilled Therapeutic Interventions/Progress Updates:    Pt received in stedy being taken into bathroom by RN and NT.  Took over for RN so pt could work on postural control in [place] while NT assisted pt with clothing management and cleansing.   Pt has a strong tendency to push to leg with RLE, so had her work on relaxing her R leg and shifting her wt to her R.  In standing pt leans back through hips, she needs mod cues and mod A to bring hips forward.  On seat she did well rising back to stand in stedy with only min A.   Pt positioned in wc and taken to gym for LUE NMR with table top towel slides with pt performing self ROM while therapists got estim unit set up. Used Empi estim on small muscle atrophy setting for 10 min on forearm to elicit wrist and finger extension. Had to adjust pads several times to prevent ulnar deviation and increased tone response on 1st digit and thumb flexors.  Pt able to tolerate level 20 intensity for 10 min, integrated functional task of "grasp" with total A and release with estim stimulation.  Pt seemed to enjoy task as seeing  her hand "move". Estim moved to triceps to decrease elbow flexor tone and to facilitate a reach motion.  Pt tolerated 20 intensity for 10 min.  Functional task of pushing cup away.   Pt returned to room with all needs met. Belt alarm on.  Therapy Documentation Precautions:  Precautions Precautions: Fall Precaution Comments: L hemi. expressive aphasia Restrictions Weight Bearing Restrictions: No    Vital Signs: Therapy Vitals Temp: 97.6 F (36.4 C) Pulse Rate: 99 Resp: 18 BP: (Abnormal) 146/65 Patient Position (if appropriate): Lying Oxygen Therapy SpO2: 98 % O2 Device: Room Air Pain:  No c/o pain  ADL: ADL Eating: Supervision/safety Grooming: Supervision/safety (Pt able to complete oral care and facial hygiene with supervision sitting at the sink.) Where Assessed-Grooming: Wheelchair, Sitting at sink Upper Body Bathing: Minimal assistance (Pt able to bathe 4/5 UB parts while completing a sponge bath at bed level. Pt requires assistance to bathe RUE d/t LUE motor control deficits.) Where Assessed-Upper Body Bathing: Bed level Lower Body Bathing: Minimal assistance (Pt able to bathe 3/4 LB parts during a partial sponge bath at bed level. Pt requires assistance to bathe L foot, however, able to bathe all other portions using a figure-four position to reach R foot.) Where Assessed-Lower Body Bathing: Bed level Upper Body Dressing: Moderate assistance (Pt requires re-education of hemi-dressing techniques. Pt able to doff and don a pull-over style shirt with  moderate assistance to fully manage LUE and to pull-over head with minimal assist to ensure sitting balance at EOB d/t L lateral leaning.) Where Assessed-Upper Body Dressing: Edge of bed Lower Body Dressing: Moderate assistance (Pt able to participate in 2/3 LB dressing parts to don pants at bed level. Pt requires assistance to thread LLE through pants, however, pt able to thread RLE. Pt able to perform bridging technique with OT  manage L side and pt manage R side to pull up.) Where Assessed-Lower Body Dressing: Bed level Toileting:  (Pt declines need at this time.) Toilet Transfer: Dependent (stedy +2) ADL Comments: Pt able to recall hemi-techniques with grooming tasks learned in previous OT session.     Therapy/Group: Individual Therapy  [place] 08/14/2022, 8:33 AM

## 2022-08-14 NOTE — Progress Notes (Signed)
Occupational Therapy Session Note  Patient Details  Name: Kara Bailey MRN: [id number] Date of Birth: 1956-06-13  Today's Date: 08/14/2022 OT Individual Time: 1300-1400 OT Individual Time Calculation (min): 60 min    Short Term Goals: Week 2:  OT Short Term Goal 1 (Week 2): Pt will complete SPT to toilet with moderate assist x 1 with use of LRAD as needed. OT Short Term Goal 2 (Week 2): Pt will don shirt with minimal assistance with moderate prompting for use of hemi-techniques. OT Short Term Goal 3 (Week 2): Pt will perform LB dressing with moderate assistance x 1 using hemi-dressing techniques as well. OT Short Term Goal 4 (Week 2): Pt will complete toileting tasks with moderate assistance x 1. OT Short Term Goal 5 (Week 2): Pt will return demo self-ROM techniques with independence on 2 sessions to demo recall and understanding of LUE NMR.  Skilled Therapeutic Interventions/Progress Updates:    Patient agreeable to participate in OT session. Reports 0/10 pain level.   Patient participated in skilled OT session focusing on NM re-ed to the LUE. Rehab tech present during session to assist with functional mobility and transfers Utilizing hemi walker, pt completed step transfer from w/c to mat table requiring Mod A. Physical assist provided to weight shift right and left offloading LE in order to advance other. VC provided for sequencing. Due to left hip weakness, manual assist provided to advance foot when stepping and/or step back towards mat.  Bed mobility performed with Mod assist, support of LUE, and set-up of LLE. Pt performed sitting to left sidelying, sidelying to supine, rolling to the right, sidelying right to prone, prone to sidelying right, sidelying right to sitting. VC provided for RUE positioning, support, and technique. Therapist facilitated NM re-ed during sidelying using powder board and vibration to engage rotator cuff, lats, biceps, and triceps. AA/ROM completed for scapular  protraction/retraction, shoulder flexion/extension. Trace movement noted over all although possible scapular protraction maybe present. W/c mobility completed when returning to room, focusing on right UE/LE strength and coordination while navigating back to room. Max VC provided for technique with visual demonstration as needed. Due to fatigue, pt was provided with min assist to complete.    Therapy Documentation Precautions:  Precautions Precautions: Fall Precaution Comments: L hemi. expressive aphasia Restrictions Weight Bearing Restrictions: No   Therapy/Group: Individual Therapy  Ailene Ravel, OTR/L,CBIS  Supplemental OT - [facility]  08/14/2022, 7:55 AM

## 2022-08-15 DIAGNOSIS — R1310 Dysphagia, unspecified: Secondary | ICD-10-CM

## 2022-08-15 MED ORDER — SORBITOL 70 % SOLN: 60.0000 mL | Freq: Once | Status: DC

## 2022-08-15 NOTE — Patient Care Conference (Signed)
Inpatient RehabilitationTeam Conference and Plan of Care Update Date: 08/15/2022   Time: 10:21 AM    Patient Name: Kara Bailey      Medical Record Number: [id number]  Date of Birth: [birth year] Sex: Female         Room/Bed: 4W11C/4W11C-01 Payor Info: Payor: [employer] MEDICARE / Plan: [employer] MEDICARE HMO / Product Type: *No Product type* /    Admit Date/Time:  08/01/2022  6:21 PM  Primary Diagnosis:  Ischemic stroke [facility])  Hospital Problems: Principal Problem:   Ischemic stroke [facility])    Expected Discharge Date: Expected Discharge Date: 08/28/22  Team Members Present: Physician leading conference: Dr. Jennye Boroughs Social Worker Present: Erlene Quan, BSW Nurse Present: Dorien Chihuahua, RN PT Present: Alden Hipp, PT OT Present: Meriel Pica, OT SLP Present: Sherren Kerns, SLP PPS Coordinator present : Ileana Ladd, PT     Current Status/Progress Goal Weekly Team Focus  Bowel/Bladder   Continent of b/b. lbm 9/30. prn milk of mag given.  Remain continent of b/b  Assist with toileting prn and qshift   Swallow/Nutrition/ Hydration   dysphagia 2 diet, thin liquids, mod I-to-sup A for swallowing strategies  sup A  tolernace of current diet, implementation of strategies, PO trials as tolerated   ADL's   UB bathing min, UB dressing mod; LB bathing mod; LB dressing total A, toileting total A. continuing use of stedy for toileting, can now do a squat pivot with mod A.  no active return in LUE with developing hypertone  minimal assist  ADL retraining, LUE NMR and estim, postural control, balance   Mobility   L hemiplegia with flexor tone in UE and mild tone in distal quad. bed mobility = Mod/ Max A, Squat pivot = MaxA, pull to stand = CGA/MinA, push to stand = ModA, stand pivot = MaxA, ambulation at R handrail and with HW with MaxA for LLE advancement  Min/ ModA for mobiltiy/ transfers; MaxA for ambulation  continued high intensity gait training, L hemibody NMR, estim,  improving LOA for transfers, sitting and standing balance/ tolerance, family education soon   Communication   sup A multimodal communication, max A expressive  mod I multimodal communication; min-to-mod A for speech strategies at the word level  Speech intelligiblity, breath support   Safety/Cognition/ Behavioral Observations            Pain   no c/o pain  Remain pain free  Assess qshift and prn   Skin   Skin intact  Remain intact  Assess qshift and prn     Discharge Planning:  Discharging home with son and boyfriend to assist. Sw still attempting to arrange family edu with family.   Team Discussion: Patient with limited carry over and little return of left UE post stroke. Requires assistance on left side due to tone and multimodal communication tools with cues to use verbal communication vs gestures or writing.  Patient on target to meet rehab goals: Currently needs max assist for ambulation. Able to complete a stand pivot with a hemi-walker but no ambulation. Do not anticipate patient will be ambulatory at discharge. Currently mod I - supervision for SLP with supervision goals. Goals for discharge set for min assist overall with mod assist for transfers.  *See Care Plan and progress notes for long and short-term goals.   Revisions to Treatment Plan:  Downgraded SLP goals Communication board in place   Teaching Needs: Safety, medications, dietary modifications, transfers, toileting, etc.  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, dysphagia, HLD,  consitpation Possible Resolutions to [employer] Focus: monitor bowel function-laxatives when need, modified diet,follow BP   Continued Need for Acute Rehabilitation Level of Care: The patient requires daily medical management by a physician with specialized training in physical medicine and rehabilitation for the following reasons: Direction of a multidisciplinary physical rehabilitation program to maximize functional independence : Yes Medical management of patient stability for increased activity during participation in an intensive rehabilitation regime.: Yes Analysis of laboratory values and/or radiology reports with any subsequent need for medication adjustment and/or medical intervention. : Yes   I attest that I was present, lead the team conference, and concur with the assessment and plan of the team.   Dorien Chihuahua B 08/15/2022, 4:18 PM

## 2022-08-15 NOTE — Progress Notes (Signed)
Speech Language Pathology Daily Session Note  Patient Details  Name: Kara Bailey MRN: [id number] Date of Birth: 10/14/1956  Today's Date: 08/15/2022 SLP Individual Time: 2025-4270 SLP Individual Time Calculation (min): 54 min  Short Term Goals: Week 2: SLP Short Term Goal 1 (Week 2): Patient will communicate functional wants/needs/wishes/feelings through multimodal communication and min A verbal/visual cues for effectiveness SLP Short Term Goal 2 (Week 2): Patient will approximate production of single syllable words with max A verbal/visual cues to achieve 50% intelligibility SLP Short Term Goal 3 (Week 2): Patient will sustain vowel sound for up 3 seconds during 3 of 5 occasions with max A cues SLP Short Term Goal 4 (Week 2): Patient will utilize low tech communcation supports Passenger transport manager) and/or written communication to convey message with sup A cues for effectiveness SLP Short Term Goal 5 (Week 2): Pt will consume dysphagia 1 textures with thin liquids with improved awareness of bolus/spillage, oral residuals, and without overt s/sx of aspiration with min A to implement swallowing precautions and strategies  Skilled Therapeutic Interventions: Skilled ST treatment focused on speech and swallowing goals. Pt was greeted supine in bed on arrival and requesting to get out of bed. Pt felt frustrated for being in bed so long. Stated she got into bed at 1900 yesterday evening. Pt reports being an early riser and likes to get up early. Will report to team during conference today. SLP facilitated upper and lower body dressing at EOB with mod A for upper body. Utilized Stedy to aid pulling up pants with overall max A and transferred into wheelchair.   SLP provided set-up A with morning meal. Pt consumed dysphagia 2 textures with mildly prolonged mastication, mild buccal stasis, and cough x1. Pt was at mod I-to-sup A level for implementing swallowing compensatory strategies including left  lingual sweep. Pt with improved monitoring of minimal anterior spillage. Pt consumed meal at an appropriate pace, and was also seen consuming appropriate size bites. Pt consumed thin liquids by cup without overt s/sx of aspiration. Recommend continuation of current diet with intermittent supervision from staff during meals. Pt verbalized understanding and agreement.   Patient was left in wheelchair with alarm activated and immediate needs within reach at end of session. Continue per current plan of care.      Addendum 12:06: spoke with team regarding pt's preference to get out of bed in the morning for breakfast. SLP informed pt's nurse, [place] LPN, as well as requested for him to communicate with NT and to pass along report to upcoming shifts.   Pain  None/denied  Therapy/Group: Individual Therapy  Patty Sermons 08/15/2022, 8:38 AM

## 2022-08-15 NOTE — Progress Notes (Signed)
Physical Therapy Session Note  Patient Details  Name: Kara Bailey MRN: [id number] Date of Birth: 1956/01/21  Today's Date: 08/15/2022 PT Individual Time: 0852-1005 PT Individual Time Calculation (min): 73 min   Short Term Goals: Week 1:  PT Short Term Goal 1 (Week 1): Pt will perform bed mobility with max assist of 1 consistently PT Short Term Goal 1 - Progress (Week 1): Met PT Short Term Goal 2 (Week 1): Pt will sit EOB with CGA >3 mintues PT Short Term Goal 2 - Progress (Week 1): Met PT Short Term Goal 3 (Week 1): Pt will propell WC >100with min assist PT Short Term Goal 3 - Progress (Week 1): Met PT Short Term Goal 4 (Week 1): Pt will tolerate standing >1 min with max assist from PT PT Short Term Goal 4 - Progress (Week 1): Met Week 2:  PT Short Term Goal 1 (Week 2): pt will perform bed mobiltiy with mod assist PT Short Term Goal 2 (Week 2): Pt will transfer to [facility] with mod assist PT Short Term Goal 3 (Week 2): Pt will propell WC 164f with min assist PT Short Term Goal 4 (Week 2): Pt will perform sit<>stand with mod assist consistently  Skilled Therapeutic Interventions/Progress Updates:  Patient seated upright in w/c on entrance to room. Patient alert and agreeable to PT session. Eating breakfast on entrance to room. She has written a note to this therapist stating that she needs to be in the room at 11am as her dtr is coming to help her take care of some business. Schedule has OT starting at 11:15, so pt should be fine.   Patient with no pain complaint at start of session.  Therapeutic Activity/ Attended E-stim/ NMR: Doffed L shoe and GRAFO to reduce weight to LLE. Positioned with in w/c for LAQ performance with NMES. [place] NMES set up for quad activation using muscle atrophy setting with amplitude set at 31 mA for muscle activation. AAROM performed with pt demonstrating increased hip flexion with minimal knee extension. Quad activation apparent. Runtime = 160m. No  redness or adverse skin reaction to estim treatment. NMR performed for improvements in motor control and coordination, balance, sequencing, judgement, and self confidence/ efficacy in performing all aspects of mobility at highest level of independence.   Transfers: Pt performed sit<>stand transfers throughout session with ModA  and provided verbal cues for pushing up from armrest, full upright posture.  Gait Training:  Pt ambulated 38 ft using R hallway HR with transition to HE for additional 15 ft all with MaxA for LLE advancement and guard to prevent buckling and/ or hyperextension. Demonstrated continued ability to perform trunk extension with abdominal contraction. Provided vc/ tc for sequencing, technique.  Patient seated upright in  at end of session with brakes locked, belt alarm set, and all needs within reach.  Therapy Documentation Precautions:  Precautions Precautions: Fall Precaution Comments: L hemi. expressive aphasia Restrictions Weight Bearing Restrictions: No General:   Vital Signs:   Pain:  No pain indicated this session.   Therapy/Group: Individual Therapy  JuAlger Bailey, DPT, CSRS 08/15/2022, 10:51 AM

## 2022-08-15 NOTE — Progress Notes (Signed)
PROGRESS NOTE   Subjective/Complaints:  Reports she doesn't like to be in bed for a long time. She likes for her clothes to be more organized in the room.   ROS: + constipation. No SOB,cough, chest pain, no N/V. Limited d/t aphasia.   Objective:   No results found. Recent Labs    08/13/22 0650  WBC 9.3  HGB 10.4*  HCT 30.8*  PLT 364    Recent Labs    08/13/22 0650  NA 138  K 4.0  CL 107  CO2 22  GLUCOSE 89  BUN 17  CREATININE 0.98  CALCIUM 9.3     Intake/Output Summary (Last 24 hours) at 08/15/2022 1024 Last data filed at 08/15/2022 0855 Gross per 24 hour  Intake 326 ml  Output --  Net 326 ml         Physical Exam: Vital Signs Blood pressure 127/74, pulse 95, temperature 98.2 F (36.8 C), temperature source Oral, resp. rate 16, weight 59.4 kg, SpO2 100 %.  General: No acute distress Mood and affect are appropriate Heart: Regular rate and rhythm no rubs murmurs or extra sounds, good air movement Lungs: Clear to auscultation, breathing unlabored, no rales or wheezes Abdomen: Positive bowel sounds, soft nontender to palpation, nondistended Extremities: No clubbing, cyanosis, or edema Skin: warm and dry   Neurologic: Intact comprehension, needs to use writing tablet to express herself. Cranial nerves II through XII intact. LUE flaccid.   PE from prior encounter:  motor strength is 5/5 in right , 0/5 left deltoid, bicep, tricep, grip, hip flexor, knee extensors, ankle dorsiflexor and plantar flexor Sensory exam pt reports some sensation but impaired LUE>LLE Musculoskeletal: Full range of motion in all 4 extremities. No joint swelling   Assessment/Plan: 1. Functional deficits which require 3+ hours per day of interdisciplinary therapy in a comprehensive inpatient rehab setting. Physiatrist is providing close team supervision and 24 hour management of active medical problems listed  below. Physiatrist and rehab team continue to assess barriers to discharge/monitor patient progress toward functional and medical goals  Care Tool:  Bathing    Body parts bathed by patient: Chest, Abdomen, Right upper leg, Left upper leg, Face, Left arm   Body parts bathed by helper: Right arm, Front perineal area, Buttocks, Right lower leg, Left lower leg     Bathing assist Assist Level: Moderate Assistance - Patient 50 - 74%     Upper Body Dressing/Undressing Upper body dressing   What is the patient wearing?: Pull over shirt    Upper body assist Assist Level: Moderate Assistance - Patient 50 - 74%    Lower Body Dressing/Undressing Lower body dressing      What is the patient wearing?: Pants     Lower body assist Assist for lower body dressing: Maximal Assistance - Patient 25 - 49%     Toileting Toileting    Toileting assist Assist for toileting: Maximal Assistance - Patient 25 - 49%     Transfers Chair/bed transfer  Transfers assist  Chair/bed transfer activity did not occur: Safety/medical concerns  Chair/bed transfer assist level: Maximal Assistance - Patient 25 - 49%     Locomotion Ambulation   Ambulation assist  Ambulation activity did not occur: Safety/medical concerns          Walk 10 feet activity   Assist  Walk 10 feet activity did not occur: Safety/medical concerns        Walk 50 feet activity   Assist Walk 50 feet with 2 turns activity did not occur: Safety/medical concerns         Walk 150 feet activity   Assist Walk 150 feet activity did not occur: Safety/medical concerns         Walk 10 feet on uneven surface  activity   Assist Walk 10 feet on uneven surfaces activity did not occur: Safety/medical concerns         Wheelchair     Assist Is the patient using a wheelchair?: Yes Type of Wheelchair: Manual    Wheelchair assist level: Dependent - Patient 0% Max wheelchair distance: 150    Wheelchair  50 feet with 2 turns activity    Assist        Assist Level: Dependent - Patient 0%   Wheelchair 150 feet activity     Assist      Assist Level: Dependent - Patient 0%   Blood pressure 127/74, pulse 95, temperature 98.2 F (36.8 C), temperature source Oral, resp. rate 16, weight 59.4 kg, SpO2 100 %.  Medical Problem List and Plan: 1. 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  Antithrombotics: -DVT/anticoagulation:  Pharmaceutical: cont Lovenox , doppler without evidence of DVT              -antiplatelet therapy: continue Plavix and aspirin as PTA 3. Pain Management: tylenol as needed 4. Depression/insomnia: LCSW to evaluate and provide emotional support             -antipsychotic agents: n/a             -bupropion ER 150 mg q AM             -continue mirtazapine 30 mg q HS             -alprazolam 0.5 mg daily prn anxiety 5. Neuropsych/cognition: This patient is capable of making decisions on her own behalf. 6. Skin/Wound Care: routine skin care checks 7. Dysphagia: routine Is and Os and follow-up chemistries             continue Dysphagia 1 diet/thin-SLP eval-  -explained the importance of her nutrition  -10/4 dys 2 diet, thin liquid started yesterday 9: Hypertension: continue to monitor;  avoid hypotension             -continue Cardizem 180 mg daily   Vitals:   08/14/22 2113 08/15/22 0445  BP: 125/78 127/74  Pulse: 98 95  Resp: 16 16  Temp: (!) 97.5 F (36.4 C) 98.2 F (36.8 C)  SpO2: 100% 100%   Controlled HR borderline tachy  - 10/1 - WNL, monitor -10/4 BP well controlled 10: Hyperlipidemia: LDL = 125, counsel regarding healthy diet              -continue  ezetimibe 10 mg daily             -continue atorvastatin 90 mg daily 11: Tobacco use: cessation counseling             -continue nicotine patch 21 mg 12. Constipation             -Order sorbitol  -small,hard bm 9/22             - 10/1 - appears all BMS small or smears since admission. Added BID miralax, PM sennakot-S scheduled.   -10/4 order sorbitol 13. GERD             -continue PPI 14.  Dysarthria improving continue SLP 15. Sneezing and coughing: respiratory panel negative- appears better today , ? Allergy    LOS: 14 days A FACE TO FACE EVALUATION WAS PERFORMED  Jennye Boroughs 08/15/2022, 10:24 AM

## 2022-08-15 NOTE — Progress Notes (Signed)
Occupational Therapy Session Note  Patient Details  Name: Kara Bailey MRN: [id number] Date of Birth: 10/14/1956  Today's Date: 08/15/2022 OT Individual Time: 1120-1205 OT Individual Time Calculation (min): 45 min    Short Term Goals: Week 1:  OT Short Term Goal 1 (Week 1): Pt will be able to complete a sq pivot toilet transfer with max A of 1. OT Short Term Goal 1 - Progress (Week 1): Met OT Short Term Goal 2 (Week 1): Pt will be able to rise to stand and hold grab bar with R hand with mod A of 1. OT Short Term Goal 2 - Progress (Week 1): Met OT Short Term Goal 3 (Week 1): Pt will hold static stand with RUE support during LB self care. OT Short Term Goal 3 - Progress (Week 1): Not met OT Short Term Goal 4 (Week 1): Pt will don a pullover shirt with min A OT Short Term Goal 4 - Progress (Week 1): Not met OT Short Term Goal 5 (Week 1): Pt will demonstrate self management of L arm with positioning with min cues. OT Short Term Goal 5 - Progress (Week 1): Met Week 2:  OT Short Term Goal 1 (Week 2): Pt will complete SPT to toilet with moderate assist x 1 with use of LRAD as needed. OT Short Term Goal 2 (Week 2): Pt will don shirt with minimal assistance with moderate prompting for use of hemi-techniques. OT Short Term Goal 3 (Week 2): Pt will perform LB dressing with moderate assistance x 1 using hemi-dressing techniques as well. OT Short Term Goal 4 (Week 2): Pt will complete toileting tasks with moderate assistance x 1. OT Short Term Goal 5 (Week 2): Pt will return demo self-ROM techniques with independence on 2 sessions to demo recall and understanding of LUE NMR.  Skilled Therapeutic Interventions/Progress Updates:    Pt received in wc with a handwritten note to me explaining she did not want to leave the room because she was waiting for her daughter to arrive to help with paperwork. Pt agreeable to engaging in therapy in the room. Focused on estim to LUE.  Used empi estim unit on small  muscle atrophy setting.  On forearm to stimulate wrist and finger extension she was able to tolerate an intensity of 17 (more than that finger flexors began to pull in ) and 21 on triceps.  Pt worked on A/arom reach with pushing arm forward and pushing hand out with each stimulation cycle with therapist lifting arm to 90 degrees and pt cued to work on active scapular retraction.  Trace scapular retraction noted, pt did actively participate in attending to her arm and visualizing it pushing something away.  Pt tolerated 20 min of estim with no adverse skin reactions.  Worked on vocal expression with counting numbers, saying childrens' names, and important need words of bathroom, help, water.   Her voice is extremely soft but I could her her enunciations.    Discussed bathroom set up.  Wrote a note to her daughter to ask her to measure bathroom door width and provide diagram of bathroom set up.   Pt resting in wc with all needs met and belt alarm on.   Therapy Documentation Precautions:  Precautions Precautions: Fall Precaution Comments: L hemi. expressive aphasia Restrictions Weight Bearing Restrictions: No   Pain:  No c/o pain      Therapy/Group: Individual Therapy  SAGUIER,JULIA 08/15/2022, 12:14 PM 

## 2022-08-15 NOTE — Progress Notes (Signed)
Physical Therapy Session Note  Patient Details  Name: SARYE KATH MRN: [id number] Date of Birth: [birth year]  Today's Date: 08/15/2022 PT Individual Time: 1834-3735 PT Individual Time Calculation (min): 47 min   Short Term Goals: Week 1:  PT Short Term Goal 1 (Week 1): Pt will perform bed mobility with max assist of 1 consistently PT Short Term Goal 1 - Progress (Week 1): Met PT Short Term Goal 2 (Week 1): Pt will sit EOB with CGA >3 mintues PT Short Term Goal 2 - Progress (Week 1): Met PT Short Term Goal 3 (Week 1): Pt will propell WC >100with min assist PT Short Term Goal 3 - Progress (Week 1): Met PT Short Term Goal 4 (Week 1): Pt will tolerate standing >1 min with max assist from PT PT Short Term Goal 4 - Progress (Week 1): Met Week 2:  PT Short Term Goal 1 (Week 2): pt will perform bed mobiltiy with mod assist PT Short Term Goal 2 (Week 2): Pt will transfer to [facility] with mod assist PT Short Term Goal 3 (Week 2): Pt will propell WC 163f with min assist PT Short Term Goal 4 (Week 2): Pt will perform sit<>stand with mod assist consistently  Skilled Therapeutic Interventions/Progress Updates:  Patient seated upright in w/c on entrance to room. Patient alert and agreeable to PT session.   Patient with no pain complaint at start of session.  Therapeutic Activity: Transfers: Pt performed sit<>stand transfers throughout session with Mod A for balance and completion to upright stance with push from w/c armrest, balance attainment, and [facility] Provided verbal cues for technique throughout.  Wheelchair Mobility:  Pt educated on pivot point of w/c and setup for turns/ avoiding obstacles. Guided pt in propel of w/c through cones placed 660fapart in [place]. Provided visual and verbal cues throughout for technique, sequencing RUE and RLE to manipulate path.  Gait Training:  Pt ambulated 110 ft using HW with MaxA for LLE advancement and guard to prevent buckling and/ or  hyperextension. Demonstrated continued ability to perform trunk extension with abdominal contraction. Provided vc/ tc for sequencing, technique.  SO present in room at end of session and requested info from pt re: bathroom setup. Requested a picture and/ or measurements in order to appropriately order DME pt may need and to practice with a setup here as close to pt's home environment as possible to prep for d/c.  Patient seated upright at end of session with brakes locked, belt alarm set, and all needs within reach.   Therapy Documentation Precautions:  Precautions Precautions: Fall Precaution Comments: L hemi. expressive aphasia Restrictions Weight Bearing Restrictions: No General:   Vital Signs:   Pain: Pain Assessment Pain Scale: Faces Faces Pain Scale: No hurt  Therapy/Group: Individual Therapy  JuAlger SimonsT, DPT, CSRS 08/15/2022, 7:09 PM

## 2022-08-15 NOTE — Progress Notes (Signed)
Patient ID: Kara Bailey, female   DOB: [birth year], 66 y.o.   MRN: [id number]  Team Conference Report to Patient/Family  Team Conference discussion was reviewed with the patient and caregiver, including goals, any changes in plan of care and target discharge date.  Patient and caregiver express understanding and are in agreement.  The patient has a target discharge date of 08/28/22.  Conference updates provided to patient son. Family will be in attendance of family edu 10/11-13  Dyanne Iha 08/15/2022, 1:30 PM

## 2022-08-16 NOTE — Progress Notes (Signed)
Physical Therapy Session Note  Patient Details  Name: Kara Bailey MRN: [id number] Date of Birth: 01-06-1956  Today's Date: 08/16/2022 PT Individual Time: 8280-0349 PT Individual Time Calculation (min): 53 min   Short Term Goals: Week 2:  PT Short Term Goal 1 (Week 2): pt will perform bed mobiltiy with mod assist PT Short Term Goal 2 (Week 2): Pt will transfer to [facility] with mod assist PT Short Term Goal 3 (Week 2): Pt will propell WC 164f with min assist PT Short Term Goal 4 (Week 2): Pt will perform sit<>stand with mod assist consistently  Skilled Therapeutic Interventions/Progress Updates:    Pt received sitting in wheelchair with her significant other present and pt eager to participate in therapy session.  Transported to/from gym in w/c for time management and energy conservation.  Pt already wearing L LE Sprystep Max GRAFO.   Sit>stand w/c>R UE support on litegait handle with min assist for lifting to stand and balance while therapist facilitating L hip/knee extension while rising. Standing with min assist and L knee block, donned litegait harness total assist.   Gait training 176foverground in litegait harness for safety but not providing true body weight support and NOT allowing pt to use R UE on handle; +2 managing litegait and therapist providing mod assist for the following:  - max assist to advance L LE during swing, donned toe cap half way through, with pt initiating swing and achieving <25% of the movement - max manual facilitation for L hip/knee extension and L forward weight shift onto L stance limb; however, pt demos activation of L quads and can perform with verbal cuing - pt has strong L lean during R stance phase requiring cuing and increased time to motor plan/problem solve how to weight shift R without using R UE support to compensate (therapist did not allow pt to use R UE support on litegait handle)   Standing with min assist as above and doffed harness.   Gait  training additional ~[id number] with +2 providing R HHA and therapist providing mod/max assist for balance and L LE management as described above. Pt does try to pull on +2 assist's R hand held support to weight shift R but cuing not to. Cuing for improved upright posture.  Transported back to her room and pt left seated upright in w/c with needs in reach, L UE supported on 1/2 lap tray, seat belt alarm on, significant other present, and meal tray set-up per nursing request.   Therapy Documentation Precautions:  Precautions Precautions: Fall Precaution Comments: L hemi. expressive aphasia Restrictions Weight Bearing Restrictions: No   Pain: No reports of pain throughout session.   Therapy/Group: Individual Therapy  CarTawana ScalePT, DPT, NCS, CSRS 08/16/2022, 3:27 PM

## 2022-08-16 NOTE — Progress Notes (Signed)
Physical Therapy Session Note  Patient Details  Name: Kara Bailey MRN: [id number] Date of Birth: 06/15/1956  Today's Date: 08/16/2022 PT Individual Time: 1300-1345 PT Individual Time Calculation (min): 45 min   Short Term Goals: Week 2:  PT Short Term Goal 1 (Week 2): pt will perform bed mobiltiy with mod assist PT Short Term Goal 2 (Week 2): Pt will transfer to [facility] with mod assist PT Short Term Goal 3 (Week 2): Pt will propell WC 161f with min assist PT Short Term Goal 4 (Week 2): Pt will perform sit<>stand with mod assist consistently  Skilled Therapeutic Interventions/Progress Updates:    Pt received in recliner and agreeable to therapy.  No complaint of pain. Pt performed max A stand step transfer to w/c with hemi walker. Pt indicated HW is uncomfortable on her hand, so therapist applied coban, pt reports this feels better. Pt propelled w/c with RUE and RLE with supervision and occ min A 2 x ~200 ft to main gym and then day room. Pt then ambulated 3 x 10 ft with R hall rail. Max A for LLE stance and swing. Attempted backwards ambulation, but limited by poor set up. Pt then returned to room and remained in w/c, was left with all needs in reach and alarm active.   Therapy Documentation Precautions:  Precautions Precautions: Fall Precaution Comments: L hemi. expressive aphasia Restrictions Weight Bearing Restrictions: No General:       Therapy/Group: Individual Therapy  OMickel Fuchs10/03/2022, 1:30 PM

## 2022-08-16 NOTE — Progress Notes (Signed)
PROGRESS NOTE   Subjective/Complaints:  No new concerns or complaints.   ROS: + constipation-improved. No SOB,cough, chest pain, no N/V. Limited d/t aphasia.   Objective:   No results found. No results for input(s): "WBC", "HGB", "HCT", "PLT" in the last 72 hours.  No results for input(s): "NA", "K", "CL", "CO2", "GLUCOSE", "BUN", "CREATININE", "CALCIUM" in the last 72 hours.   Intake/Output Summary (Last 24 hours) at 08/16/2022 1059 Last data filed at 08/16/2022 0933 Gross per 24 hour  Intake 574 ml  Output --  Net 574 ml         Physical Exam: Vital Signs Blood pressure 134/67, pulse 94, temperature 98.1 F (36.7 C), temperature source Oral, resp. rate 16, weight 59.4 kg, SpO2 100 %.  General: No acute distress, Sitting in bedside chair Mood and affect are appropriate Heart: Regular rate and rhythm no rubs murmurs or extra sounds, good air movement Lungs: Clear to auscultation, breathing unlabored, no rales or wheezes. Good air movement Abdomen: Positive bowel sounds, soft nontender to palpation, nondistended Extremities: No clubbing, cyanosis, or edema Skin: warm and dry, no breakdown noted   Neurologic: Intact comprehension, needs to use writing tablet to express herself. Cranial nerves II through XII intact. LUE flaccid.   PE from prior encounter:  motor strength is 5/5 in right , 0/5 left deltoid, bicep, tricep, grip, hip flexor, knee extensors, ankle dorsiflexor and plantar flexor Sensory exam pt reports some sensation but impaired LUE>LLE Musculoskeletal: Full range of motion in all 4 extremities. No joint swelling   Assessment/Plan: 1. Functional deficits which require 3+ hours per day of interdisciplinary therapy in a comprehensive inpatient rehab setting. Physiatrist is providing close team supervision and 24 hour management of active medical problems listed below. Physiatrist and rehab team  continue to assess barriers to discharge/monitor patient progress toward functional and medical goals  Care Tool:  Bathing    Body parts bathed by patient: Chest, Abdomen, Right upper leg, Left upper leg, Face, Left arm   Body parts bathed by helper: Right arm, Front perineal area, Buttocks, Right lower leg, Left lower leg     Bathing assist Assist Level: Moderate Assistance - Patient 50 - 74%     Upper Body Dressing/Undressing Upper body dressing   What is the patient wearing?: Pull over shirt    Upper body assist Assist Level: Moderate Assistance - Patient 50 - 74%    Lower Body Dressing/Undressing Lower body dressing      What is the patient wearing?: Pants     Lower body assist Assist for lower body dressing: Maximal Assistance - Patient 25 - 49%     Toileting Toileting    Toileting assist Assist for toileting: Maximal Assistance - Patient 25 - 49%     Transfers Chair/bed transfer  Transfers assist  Chair/bed transfer activity did not occur: Safety/medical concerns  Chair/bed transfer assist level: Maximal Assistance - Patient 25 - 49%     Locomotion Ambulation   Ambulation assist   Ambulation activity did not occur: Safety/medical concerns          Walk 10 feet activity   Assist  Walk 10 feet activity did not occur:  Safety/medical concerns        Walk 50 feet activity   Assist Walk 50 feet with 2 turns activity did not occur: Safety/medical concerns         Walk 150 feet activity   Assist Walk 150 feet activity did not occur: Safety/medical concerns         Walk 10 feet on uneven surface  activity   Assist Walk 10 feet on uneven surfaces activity did not occur: Safety/medical concerns         Wheelchair     Assist Is the patient using a wheelchair?: Yes Type of Wheelchair: Manual    Wheelchair assist level: Dependent - Patient 0% Max wheelchair distance: 150    Wheelchair 50 feet with 2 turns  activity    Assist        Assist Level: Dependent - Patient 0%   Wheelchair 150 feet activity     Assist      Assist Level: Dependent - Patient 0%   Blood pressure 134/67, pulse 94, temperature 98.1 F (36.7 C), temperature source Oral, resp. rate 16, weight 59.4 kg, SpO2 100 %.  Medical Problem List and Plan: 1. 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  Antithrombotics: -DVT/anticoagulation:  Pharmaceutical: cont Lovenox , doppler without evidence of DVT              -antiplatelet therapy: continue Plavix and aspirin as PTA 3. Pain Management: tylenol as needed 4. Depression/insomnia: LCSW to evaluate and provide emotional support             -antipsychotic agents: n/a             -bupropion ER 150 mg q AM             -continue mirtazapine 30 mg q HS             -alprazolam 0.5 mg daily prn anxiety 5. Neuropsych/cognition: This patient is capable of making decisions on her own behalf. 6. Skin/Wound Care: routine skin care checks 7. Dysphagia: routine Is and Os and follow-up chemistries             continue Dysphagia 1 diet/thin-SLP eval-  -explained the importance of her nutrition  -10/4 dys 2 diet, thin liquid started yesterday  -10/5 eating 75-100% diet, continue to follow 9: Hypertension: continue to monitor;  avoid hypotension             -continue Cardizem 180 mg daily   Vitals:   08/15/22 1935 08/16/22 0428  BP: (!) 144/80 134/67  Pulse: (!) 102 94  Resp: 17 16  Temp: 97.6 F (36.4 C) 98.1 F (36.7 C)  SpO2: 99% 100%   Controlled HR borderline tachy  - 10/1 - WNL, monitor -10/5 BP well controlled 10: Hyperlipidemia: LDL = 125, counsel regarding healthy diet              -continue  ezetimibe 10 mg daily             -continue atorvastatin 90 mg daily 11: Tobacco use: cessation  counseling             -continue nicotine patch 21 mg 12. Constipation             -Order sorbitol  -small,hard bm 9/22             - 10/1 - appears all BMS small or smears since admission. Added BID miralax, PM sennakot-S scheduled.   -10/5 LBM yesterday, improved 13. GERD             -continue PPI 14.  Dysarthria improving continue SLP 15. Sneezing and coughing: respiratory panel negative- appears better today , ? Allergy    LOS: 15 days A FACE TO FACE EVALUATION WAS PERFORMED  Kara Bailey 08/16/2022, 10:59 AM

## 2022-08-16 NOTE — Progress Notes (Signed)
Speech Language Pathology Weekly Progress and Session Note  Patient Details  Name: Kara Bailey MRN: [id number] Date of Birth: 1956-11-03  Beginning of progress report period: August 09, 2022 End of progress report period: August 16, 2022  Today's Date: 08/16/2022 SLP Individual Time: 0800-0900 SLP Individual Time Calculation (min): 60 min  Short Term Goals: Week 2: SLP Short Term Goal 1 (Week 2): Patient will communicate functional wants/needs/wishes/feelings through multimodal communication and min A verbal/visual cues for effectiveness SLP Short Term Goal 1 - Progress (Week 2): Met SLP Short Term Goal 2 (Week 2): Patient will approximate production of single syllable words with max A verbal/visual cues to achieve 50% intelligibility SLP Short Term Goal 2 - Progress (Week 2): Met SLP Short Term Goal 3 (Week 2): Patient will sustain vowel sound for up 3 seconds during 3 of 5 occasions with max A cues SLP Short Term Goal 3 - Progress (Week 2): Met SLP Short Term Goal 4 (Week 2): Patient will utilize low tech communcation supports Passenger transport manager) and/or written communication to convey message with sup A cues for effectiveness SLP Short Term Goal 4 - Progress (Week 2): Met SLP Short Term Goal 5 (Week 2): Pt will consume dysphagia 1 textures with thin liquids with improved awareness of bolus/spillage, oral residuals, and without overt s/sx of aspiration with min A to implement swallowing precautions and strategies SLP Short Term Goal 5 - Progress (Week 2): Met  New Short Term Goals: Week 3: SLP Short Term Goal 1 (Week 3): Patient will communicate functional wants/needs/wishes/feelings through multimodal communication and sup A verbal/visual cues for effectiveness SLP Short Term Goal 2 (Week 3): Patient will approximate production of single syllable words with sup A verbal/visual cues to achieve 50% intelligibility SLP Short Term Goal 3 (Week 3): Patient will sustain vowel sound for  up 3 seconds during 3 of 5 occasions with mod A cues SLP Short Term Goal 4 (Week 3): Patient will utilize low tech communcation supports Passenger transport manager) and/or written communication to convey message with mod I SLP Short Term Goal 5 (Week 3): Pt will consume dysphagia 2 textures with thin liquids with improved awareness of bolus/spillage, oral residuals, and without overt s/sx of aspiration with sup A to implement swallowing precautions and strategies  Weekly Progress Updates: Pt has demonstrated steady gains, as evident by meeting 5 out of 5 short-term goals this reporting period. Pt is steadily progressing toward speech goals by communicating functional needs through multimodal communication with sup A; min-to-mod A verbal cues for implementation of speech and breath support strategies at the short phrase/sentence level. Pt continues to be limited by impaired breath support coordination for speech production, which results in severely reduced vocal intensity. Pt also presenting with ongoing dysphonia characterized by vocal hoarseness, strain, and periods of aphonia. Voice deficits likely exacerbated by breath support/vocal intensity impairments. Pt has demonstrated improved oral function resulting in diet advancement to dysphagia 2 textures and thin liquids with sup A for implementation of swallowing compensatory strategies. Pt remains highly motivated. Pt and family education ongoing re: diet recommendations, supportive communication interventions, and ST POC. Continue to recommend ST intervention during CIR admission, as well as ST f/u upon d/c. Recommend 24/7 supervision and assistance at time of discharge.     Intensity: Minumum of 1-2 x/day, 30 to 90 minutes Frequency: 3 to 5 out of 7 days Duration/Length of Stay: 10/17 Treatment/Interventions: Dysphagia/aspiration precaution training;Speech/Language facilitation;Oral motor exercises;Therapeutic Activities;Functional tasks;Patient/family  education;Multimodal communication approach  Daily Session Skilled Therapeutic  Interventions: Skilled ST treatment focused on speech and swallowing goals. Pt received upright in wheelchair on arrival and consuming breakfast. Pt was pleased to be out of bed and in wheelchair for morning meal, as discussed with team yesterday. Pt and SLP had discussion regarding progress and current SLP POC. Pt reinforced a personal goal of hers is to "speak a full sentence clearly. Will continue efforts toward this, as well as progression toward other established ST/LTGs. Pt reported she feels her speech is improving as compared to last week. SLP supports this observation as evidenced by improved vocal intensity and more intentionality with breath support techniques.   SLP addressed speech goals with basic verbal reasoning task to facilitate use of speech intelligibility strategies at the word and phrase/short sentence level. Pt produced single and 2-3 syllable words with 50-75% intelligibility with sup A verbal cues, and at the sentence level with ~50% intelligibility given min-to-mod A verbal cues for implementation of breath support techniques. Pt verbalized 2-3 words per breath with average quality (significantly improved from previous sessions) and demonstrated excellent self monitoring skills to pause and repair speech breakdowns which were most consistently attributed to decreased vocal intensity and/or decreasing vocal quality. SLP facilitated sustained vowel sound for average of 3 seconds during 4/8 occasions with max A verbal cues. Pt continues to be limited by vocal hoarseness/strain/aphonia. This improved with throat clear and sips of water. Daughter arrived during session. SLP provided education on dysphagia 2 diet textures and speech strategies. Daughter pleased with progress. Patient was passed off to OT at end of session. Continue per current plan of care.      General    Pain    Therapy/Group: Individual  Therapy  Patty Sermons 08/16/2022, 12:24 PM

## 2022-08-16 NOTE — Plan of Care (Signed)
  Problem: RH SKIN INTEGRITY Goal: RH STG SKIN FREE OF INFECTION/BREAKDOWN Description: Skin will remain free of any infection/breakdown with min assist Outcome: Progressing Goal: RH STG MAINTAIN SKIN INTEGRITY WITH ASSISTANCE Description: STG Maintain Skin Integrity With min Assistance. Outcome: Progressing Goal: RH STG ABLE TO PERFORM INCISION/WOUND CARE W/ASSISTANCE Description: STG Able To Perform Incision/Wound Care With min Assistance. Outcome: Progressing   Problem: RH SAFETY Goal: RH STG ADHERE TO SAFETY PRECAUTIONS W/ASSISTANCE/DEVICE Description: STG Adhere to Safety Precautions With min Assistance/Device. Outcome: Progressing   Problem: RH PAIN MANAGEMENT Goal: RH STG PAIN MANAGED AT OR BELOW PT'S PAIN GOAL Description: Pain will be managed at 3 out of 10 on pain scale independently  Outcome: Progressing

## 2022-08-16 NOTE — Progress Notes (Signed)
Occupational Therapy Session Note  Patient Details  Name: Kara Bailey MRN: [id number] Date of Birth: 1956-08-09  Today's Date: 08/16/2022 OT Individual Time: 2122-4825 OT Individual Time Calculation (min): 49 min    Short Term Goals: Week 2:  OT Short Term Goal 1 (Week 2): Pt will complete SPT to toilet with moderate assist x 1 with use of LRAD as needed. OT Short Term Goal 2 (Week 2): Pt will don shirt with minimal assistance with moderate prompting for use of hemi-techniques. OT Short Term Goal 3 (Week 2): Pt will perform LB dressing with moderate assistance x 1 using hemi-dressing techniques as well. OT Short Term Goal 4 (Week 2): Pt will complete toileting tasks with moderate assistance x 1. OT Short Term Goal 5 (Week 2): Pt will return demo self-ROM techniques with independence on 2 sessions to demo recall and understanding of LUE NMR.  Skilled Therapeutic Interventions/Progress Updates:  Pt handed off from SLP with daughter present, pt agreeable to OT intervention. Session focus on BADL reeducation, functional mobility, dynamic standing balance and decreasing overall caregiver burden.  Pt requested to wash up at sink, ( pt declined getting in shower). Pt completed wash up at sink with MOD A for bathing overall, pt needed assist to wash RUE and her back, pt declined washing LB. Pt donned [place] shirt with MOD A with education provided on hemitechnique. Pt donned pants with MOD A needing assist to thread LLE but able to thread RLE, pt stood with MODA with HW to assist with pulling pants to waist line.  Pt able to don socks with MOD A using one handed technique. Pt completed seated oral care at sink with [place] with education provided on hemitechniques. Pt completed stand pivot to recliner from w/c with MOD A to R side.  Pt did a great job expressing needs during session using a combination of gestures and verbalizations. pt left  seated in recliner with safety belt activated and all needs  within reach.              Therapy Documentation Precautions:  Precautions Precautions: Fall Precaution Comments: L hemi. expressive aphasia Restrictions Weight Bearing Restrictions: No    Pain: Unrated shoulder pain, rest breaks and repositioning provided as needed.    Therapy/Group: Individual Therapy  Precious Haws 08/16/2022, 12:21 PM

## 2022-08-17 DIAGNOSIS — G4700 Insomnia, unspecified: Secondary | ICD-10-CM

## 2022-08-17 NOTE — Progress Notes (Signed)
Patient ID: Kara Bailey, female   DOB: 09-27-1956, 66 y.o.   MRN: [id number]   SW and PT confirmed patient discharge plan with son, [name]. Patient will discharge back home with daughter per patient request. Family feels this is safest d/c plan.

## 2022-08-17 NOTE — Progress Notes (Signed)
PROGRESS NOTE   Subjective/Complaints: No new concerns or complaints this AM. She is sitting in her bedside chair.   ROS: + constipation-improved. No SOB,cough, new vision changes, chest pain, no N/V. Limited d/t aphasia.   Objective:   No results found. No results for input(s): "WBC", "HGB", "HCT", "PLT" in the last 72 hours.  No results for input(s): "NA", "K", "CL", "CO2", "GLUCOSE", "BUN", "CREATININE", "CALCIUM" in the last 72 hours.   Intake/Output Summary (Last 24 hours) at 08/17/2022 0806 Last data filed at 08/16/2022 1825 Gross per 24 hour  Intake 574 ml  Output --  Net 574 ml         Physical Exam: Vital Signs Blood pressure 106/73, pulse 100, temperature 98.3 F (36.8 C), resp. rate 16, weight 59.4 kg, SpO2 97 %.  General: No acute distress, Sitting in bedside chair Mood and affect are appropriate Heart: Regular rate and rhythm no rubs murmurs or extra sounds, good air movement Lungs: Clear to auscultation, breathing unlabored, no rales or wheezes.  Abdomen: Positive bowel sounds, soft nontender to palpation, nondistended Extremities: No clubbing, cyanosis, or edema Skin: warm and dry, no breakdown noted   Neurologic: Intact comprehension, writes to communicates or nods head, Cranial nerves II through XII intact. LUE flaccid.   PE from prior encounter:  motor strength is 5/5 in right , 0/5 left deltoid, bicep, tricep, grip, hip flexor, knee extensors, ankle dorsiflexor and plantar flexor Sensory exam pt reports some sensation but impaired LUE>LLE Musculoskeletal: Full range of motion in all 4 extremities. No joint swelling or tenderness noted   Assessment/Plan: 1. Functional deficits which require 3+ hours per day of interdisciplinary therapy in a comprehensive inpatient rehab setting. Physiatrist is providing close team supervision and 24 hour management of active medical problems listed  below. Physiatrist and rehab team continue to assess barriers to discharge/monitor patient progress toward functional and medical goals  Care Tool:  Bathing    Body parts bathed by patient: Left arm, Chest, Abdomen, Face   Body parts bathed by helper: Right arm     Bathing assist Assist Level: Moderate Assistance - Patient 50 - 74%     Upper Body Dressing/Undressing Upper body dressing   What is the patient wearing?: Pull over shirt    Upper body assist Assist Level: Moderate Assistance - Patient 50 - 74%    Lower Body Dressing/Undressing Lower body dressing      What is the patient wearing?: Pants     Lower body assist Assist for lower body dressing: Moderate Assistance - Patient 50 - 74%     Toileting Toileting    Toileting assist Assist for toileting: Maximal Assistance - Patient 25 - 49%     Transfers Chair/bed transfer  Transfers assist  Chair/bed transfer activity did not occur: Safety/medical concerns  Chair/bed transfer assist level: Moderate Assistance - Patient 50 - 74%     Locomotion Ambulation   Ambulation assist   Ambulation activity did not occur: Safety/medical concerns          Walk 10 feet activity   Assist  Walk 10 feet activity did not occur: Safety/medical concerns  Walk 50 feet activity   Assist Walk 50 feet with 2 turns activity did not occur: Safety/medical concerns         Walk 150 feet activity   Assist Walk 150 feet activity did not occur: Safety/medical concerns         Walk 10 feet on uneven surface  activity   Assist Walk 10 feet on uneven surfaces activity did not occur: Safety/medical concerns         Wheelchair     Assist Is the patient using a wheelchair?: Yes Type of Wheelchair: Manual    Wheelchair assist level: Dependent - Patient 0% Max wheelchair distance: 150    Wheelchair 50 feet with 2 turns activity    Assist        Assist Level: Dependent - Patient 0%    Wheelchair 150 feet activity     Assist      Assist Level: Dependent - Patient 0%   Blood pressure 106/73, pulse 100, temperature 98.3 F (36.8 C), resp. rate 16, weight 59.4 kg, SpO2 97 %.  Medical Problem List and Plan: 1. 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  Antithrombotics: -DVT/anticoagulation:  Pharmaceutical: cont Lovenox , doppler without evidence of DVT              -antiplatelet therapy: continue Plavix and aspirin as PTA 3. Pain Management: tylenol as needed 4. Depression/insomnia: LCSW to evaluate and provide emotional support             -antipsychotic agents: n/a             -bupropion ER 150 mg q AM             -continue mirtazapine 30 mg q HS             -alprazolam 0.5 mg daily prn anxiety  -Reports insomnia is improved 5. Neuropsych/cognition: This patient is capable of making decisions on her own behalf. 6. Skin/Wound Care: routine skin care checks 7. Dysphagia: routine Is and Os and follow-up chemistries             continue Dysphagia 1 diet/thin-SLP eval-  -explained the importance of her nutrition  -10/4 dys 2 diet, thin liquid started yesterday  -10/5 eating 75-100% diet, continue to follow 9: Hypertension: continue to monitor;  avoid hypotension             -continue Cardizem 180 mg daily   Vitals:   08/16/22 1927 08/17/22 0535  BP: 130/79 106/73  Pulse: (!) 104 100  Resp: 20 16  Temp: 97.7 F (36.5 C) 98.3 F (36.8 C)  SpO2: 100% 97%   Controlled HR borderline tachy  - 10/1 - WNL, monitor -10/6 BP continues to be well controlled 10: Hyperlipidemia: LDL = 125, counsel regarding healthy diet             -continue  ezetimibe 10 mg daily              -continue atorvastatin 90 mg daily 11: Tobacco use: cessation counseling             -  continue nicotine patch 21 mg 12. Constipation             -Order sorbitol  -small,hard bm 9/22             - 10/1 - appears all BMS small or smears since admission. Added BID miralax, PM sennakot-S scheduled.   -10/6 LBM 10/4, continue to monitor 13. GERD             -continue PPI 14.  Dysarthria improving continue SLP 15. Sneezing and coughing: respiratory panel negative- appears better today , ? Allergy    LOS: 16 days A FACE TO FACE EVALUATION WAS PERFORMED  Jennye Boroughs 08/17/2022, 8:06 AM

## 2022-08-17 NOTE — Progress Notes (Signed)
Physical Therapy Weekly Progress Note  Patient Details  Name: Kara Bailey MRN: [id number] Date of Birth: [birth year]  Beginning of progress report period: {Time; dates multiple:304500300} End of progress report period: {Time; dates multiple:304500300}  Today's Date: 08/17/2022 PT Individual Time: [phone] and 1530-1630   40 min and 60 min   Patient has met {number 1-5:22450} of {number 1-5:20334} short term goals.  ***  Patient continues to demonstrate the following deficits {impairments:3041632} and therefore will continue to benefit from skilled PT intervention to increase functional independence with mobility.  Patient {LTG progression:3041653}.  {plan of LDJT:7017793}  PT Short Term Goals Week 2:  PT Short Term Goal 1 (Week 2): pt will perform bed mobiltiy with mod assist PT Short Term Goal 1 - Progress (Week 2): Met PT Short Term Goal 2 (Week 2): Pt will transfer to [facility] with mod assist PT Short Term Goal 2 - Progress (Week 2): Met PT Short Term Goal 3 (Week 2): Pt will propell WC 146f with min assist PT Short Term Goal 3 - Progress (Week 2): Met PT Short Term Goal 4 (Week 2): Pt will perform sit<>stand with mod assist consistently PT Short Term Goal 4 - Progress (Week 2): Met Week 3:  PT Short Term Goal 1 (Week 3): [facility] Short Term Goal 2 (Week 3): Pt wil ambulate 571fwith max A +2. PT Short Term Goal 3 (Week 3): Pt will propell WC 15049fith CGA PT Short Term Goal 4 (Week 3): Pt will perform 5xSTS to assess fall risk Week 4:     Skilled Therapeutic Interventions/Progress Updates:  Session 1.  Pt received sitting in WC and agreeable to PT  WC mobility.   Sit<>stand and weight shift in stading with RUE supported on HW>   PT applied KT tape to L shoulder for pain management.   Patient returned to room and left sitting in [facility] reach and all needs met.    Session 2.  Pt received sitting in WC and agreeable  to PT  Daughter present in room. Education for d/c home and needs for assist 24/7. Pt emotional about not wanting to d/c to SNF but would prefer to perform HHPT to continue with recovery from CVA. CSW informed of pt choice   Transfer training with sqaut pivot transfer and lateral scoot with slide board.   Sit<>supine.   Gait training.   Patient returned to room and [facility] and all needs met.        Therapy Documentation Precautions:  Precautions Precautions: Fall Precaution Comments: L hemi. expressive aphasia Restrictions Weight Bearing Restrictions: No General:   Vital Signs:   Pain:   Vision/Perception     Mobility:   Locomotion :    Trunk/Postural Assessment :    Balance:   Exercises:   Other Treatments:     Therapy/Group: Individual Therapy  AusLorie Phenix/04/2022, 10:09 AM

## 2022-08-17 NOTE — Progress Notes (Signed)
Speech Language Pathology Daily Session Note  Patient Details  Name: Kara Bailey MRN: [id number] Date of Birth: 09/07/1956  Today's Date: 08/17/2022 SLP Individual Time: 1000-1100 SLP Individual Time Calculation (min): 60 min  Short Term Goals: Week 3: SLP Short Term Goal 1 (Week 3): Patient will communicate functional wants/needs/wishes/feelings through multimodal communication and sup A verbal/visual cues for effectiveness SLP Short Term Goal 2 (Week 3): Patient will approximate production of single syllable words with sup A verbal/visual cues to achieve 50% intelligibility SLP Short Term Goal 3 (Week 3): Patient will sustain vowel sound for up 3 seconds during 3 of 5 occasions with mod A cues SLP Short Term Goal 4 (Week 3): Patient will utilize low tech communcation supports Passenger transport manager) and/or written communication to convey message with mod I SLP Short Term Goal 5 (Week 3): Pt will consume dysphagia 2 textures with thin liquids with improved awareness of bolus/spillage, oral residuals, and without overt s/sx of aspiration with sup A to implement swallowing precautions and strategies  Skilled Therapeutic Interventions: Skilled ST treatment focused on speech goals. Pt received upright in wheelchair and agreeable to participate in tx in hospital atrium area. Pt exhibited reduced vocal intensity and breath support, with increased vocal strain today which consequently impacted speech intelligibility to ~25% intelligibility at word level. Pt requiring to take a full breath to produce one word at a time at the phrase & short sentence level, which is decreased from yesterday in which she was able to produce ~3 words per breath. This appears to fluctuate with no known etiology. Suspect fatigue related. Pt was discouraged by this and concerned that she's "getting worse." SLP provided reassurance. It should also be noted breath support and vocal intensity improved slightly throughout session  with ongoing reinforcement. Pt feeling relieved. SLP assessed vitals to ensure oxygen levels were [facility] considering report/observation of vocal strain. SpO2 100, Pulse 81, BP 134/78. Pt encouraged to continue with speech practice in room between sessions. States she's been working very hard. Pt also observed initiating verbal communication with increased frequency, and relying on non-verbal communication such as gestures and writing much less. Patient was left in wheelchair with alarm activated and immediate needs within reach at end of session. Continue per current plan of care.      Pain Pain Assessment Pain Scale: 0-10 Pain Score: 0-No pain  Therapy/Group: Individual Therapy  Patty Sermons 08/17/2022, 4:17 PM

## 2022-08-17 NOTE — Progress Notes (Signed)
Occupational Therapy Weekly Progress Note  Patient Details  Name: Kara Bailey MRN: [id number] Date of Birth: [birth year]  Beginning of progress report period: August 10, 2022 End of progress report period: August 17, 2022  Today's Date: 08/17/2022 OT Individual Time: 0800-0900 OT Individual Time Calculation (min): 60 min    Patient has met 3 of 5 short term goals.    Patient continues to demonstrate the following deficits: muscle weakness and muscle paralysis, unbalanced muscle activation, motor apraxia, decreased coordination, and decreased motor planning, decreased attention to left, decreased problem solving, and decreased standing balance, decreased postural control, hemiplegia, and decreased balance strategies and therefore will continue to benefit from skilled OT intervention to enhance overall performance with BADL.  Patient progressing toward long term goals..  Continue plan of care.  OT Short Term Goals Week 2:  OT Short Term Goal 1 (Week 2): Pt will complete SPT to toilet with moderate assist x 1 with use of LRAD as needed. OT Short Term Goal 1 - Progress (Week 2): Met OT Short Term Goal 2 (Week 2): Pt will don shirt with minimal assistance with moderate prompting for use of hemi-techniques. OT Short Term Goal 2 - Progress (Week 2): Met OT Short Term Goal 3 (Week 2): Pt will perform LB dressing with moderate assistance x 1 using hemi-dressing techniques as well. OT Short Term Goal 3 - Progress (Week 2): Met OT Short Term Goal 4 (Week 2): Pt will complete toileting tasks with moderate assistance x 1. OT Short Term Goal 4 - Progress (Week 2): Partly met OT Short Term Goal 5 (Week 2): Pt will return demo self-ROM techniques with independence on 2 sessions to demo recall and understanding of LUE NMR. OT Short Term Goal 5 - Progress (Week 2): Progressing toward goal Week 3:  OT Short Term Goal 1 (Week 3): Pt will return demo self-ROM techniques with independence on 2 sessions  to demo recall and understanding of LUE NMR. OT Short Term Goal 2 (Week 3): Pt will complete toileting tasks with moderate assistance x 1.  Skilled Therapeutic Interventions/Progress Updates:    Patient agreeable to participate in OT session. Reports 0/10 pain level.   Patient participated in skilled OT session focusing on ADL re-training, functional transfers, and progress in therapy since last progress note was completed. Pt completed functional transfer from bed to w/c with Mod A and no device utilizing stand pvt technique to the right. Grab bars used to complete stand pvt transfer from w/c to shower bench with Min A and VC for technique and safety awareness. Pt provided with LHS and therapist educated patient on use of sponge during bathing in order to improve functional performance during bathing tasks. Pt completed bathing with Min A primarily needing assist with management of LUE and washing RUE as well as washing the bottoms of both feet. Dressing completed in w/c while seated at sink. LB dressing completed at Mod A level education provided to review hemi dressing techniques. UB dressing completed with Min A and additional education for hemi dressing technique. Prior to shower, pt completed grooming (brush teeth) at Mod I level while therapist retrieved shower supplies. Pt able to communicate better with clearer enunciation than at previous session this week.     Therapy Documentation Precautions:  Precautions Precautions: Fall Precaution Comments: L hemi. expressive aphasia Restrictions Weight Bearing Restrictions: No  Therapy/Group: Individual Therapy  Ailene Ravel, OTR/L,CBIS  Supplemental OT - [place] and [facility]  08/17/2022, 4:22 PM

## 2022-08-18 DIAGNOSIS — R1312 Dysphagia, oropharyngeal phase: Secondary | ICD-10-CM

## 2022-08-18 MED ORDER — MELATONIN 3 MG PO TABS
3.0000 mg | ORAL_TABLET | Freq: Every day | ORAL | Status: DC
  Administered 2022-08-18 – 2022-09-06 (×20): 3 mg via ORAL
  Filled 2022-08-18 (×21): qty 1

## 2022-08-18 NOTE — Progress Notes (Signed)
Physical Therapy Session Note  Patient Details  Name: Kara Bailey MRN: [id number] Date of Birth: 02-20-1956  Today's Date: 08/18/2022 PT Individual Time: 0810-0850 PT Individual Time Calculation (min): 40 min   Short Term Goals: Week 1:  PT Short Term Goal 1 (Week 1): Pt will perform bed mobility with max assist of 1 consistently PT Short Term Goal 1 - Progress (Week 1): Met PT Short Term Goal 2 (Week 1): Pt will sit EOB with CGA >3 mintues PT Short Term Goal 2 - Progress (Week 1): Met PT Short Term Goal 3 (Week 1): Pt will propell WC >100with min assist PT Short Term Goal 3 - Progress (Week 1): Met PT Short Term Goal 4 (Week 1): Pt will tolerate standing >1 min with max assist from PT PT Short Term Goal 4 - Progress (Week 1): Met Week 2:  PT Short Term Goal 1 (Week 2): pt will perform bed mobiltiy with mod assist PT Short Term Goal 1 - Progress (Week 2): Met PT Short Term Goal 2 (Week 2): Pt will transfer to [facility] with mod assist PT Short Term Goal 2 - Progress (Week 2): Met PT Short Term Goal 3 (Week 2): Pt will propell WC 123f with min assist PT Short Term Goal 3 - Progress (Week 2): Met PT Short Term Goal 4 (Week 2): Pt will perform sit<>stand with mod assist consistently PT Short Term Goal 4 - Progress (Week 2): Met Week 3:  PT Short Term Goal 1 (Week 3): [facility] Short Term Goal 2 (Week 3): Pt wil ambulate 55fwith max A +2. PT Short Term Goal 3 (Week 3): Pt will propell WC 1506fith CGA PT Short Term Goal 4 (Week 3): Pt will perform 5xSTS to assess fall risk Week 4:     Skilled Therapeutic Interventions/Progress Updates:   Pt received sitting in WC and agreeable to PT  Pt [facility] rehab gym [facility] to mat table with max assist and LLE blocked by PT with cues for adequate weigh tshift to the R to prevent L LOB. Sit<>supine with mod assist for management of the LLE. Rolling R and L with min assits and cues  for attention to the LLE as well as improved core activation.   supineNMR.   Gait training with HW.   WC mobility.   Kinetron.   Patient returned to room and left sitting in [facility] and all needs met.        Therapy Documentation Precautions:  Precautions Precautions: Fall Precaution Comments: L hemi. expressive aphasia Restrictions Weight Bearing Restrictions: No General:   Vital Signs:  Pain:   Mobility:   Locomotion :    Trunk/Postural Assessment :    Balance:   Exercises:   Other Treatments:      Therapy/Group: Individual Therapy  AusLorie Phenix/05/2022, 8:50 AM

## 2022-08-18 NOTE — Progress Notes (Signed)
Physical Therapy Session Note  Patient Details  Name: Kara Bailey MRN: [id number] Date of Birth: [birth year]  Today's Date: 08/18/2022 PT Individual Time: 1002-1046 PT Individual Time Calculation (min): 44 min   Short Term Goals: Week 2:  PT Short Term Goal 1 (Week 2): pt will perform bed mobiltiy with mod assist PT Short Term Goal 1 - Progress (Week 2): Met PT Short Term Goal 2 (Week 2): Pt will transfer to [facility] with mod assist PT Short Term Goal 2 - Progress (Week 2): Met PT Short Term Goal 3 (Week 2): Pt will propell WC 170f with min assist PT Short Term Goal 3 - Progress (Week 2): Met PT Short Term Goal 4 (Week 2): Pt will perform sit<>stand with mod assist consistently PT Short Term Goal 4 - Progress (Week 2): Met Week 3:  PT Short Term Goal 1 (Week 3): [facility] Short Term Goal 2 (Week 3): Pt wil ambulate 521fwith max A +2. PT Short Term Goal 3 (Week 3): Pt will propell WC 15042fith CGA PT Short Term Goal 4 (Week 3): Pt will perform 5xSTS to assess fall risk   Skilled Therapeutic Interventions/Progress Updates:  Patient seated upright in w/c on entrance to room. Patient alert and agreeable to PT session.   Patient with no pain complaint at start of session.  Therapeutic Activity: Transfers: Pt performed sit<>stand transfers during session with anywhere from MinA to MaxA to reach full upright stance pushing up from w/c to reach [facility] for sequencing of forward scoot, leaning forward, then pushing up and forward to rise.  Gait Training:  Pt ambulated 60 ft using HW with maxA. Demonstrated minimal activation in LLE and required constant cueing for upright posture to improve glute activation. Provided vc/ tc throughout for sequencing HW and steps.   Neuromuscular Re-ed: NMR facilitated during session with focus onstanding balance. Pt guided in mechanics of rise to stand and squat pivot transfer w/c<>mat table. NDT  cueing at ribcage for holding trunk upright and hinging forward at hips to improve unweigthing. Used same concept for positioning in w/c, improving AP and lateral scoot.  Pt also performs squat pivot transfer to R side x2 with MinA to complete first attempt  and Mod/ Max to complete back to w/c from. NMR performed for improvements in motor control and coordination, balance, sequencing, judgement, and self confidence/ efficacy in performing all aspects of mobility at highest level of independence.   Patient seated upright in w/c at end of session with brakes locked, belt alarm set, and all needs within reach.   Therapy Documentation Precautions:  Precautions Precautions: Fall Precaution Comments: L hemi. expressive aphasia Restrictions Weight Bearing Restrictions: No General:   Vital Signs:   Pain:  No pain related this day but did request to remove AFO and shoes at end of session.   Therapy/Group: Individual Therapy  JulAlger Simons, DPT, CSRS 08/18/2022, 5:27 PM

## 2022-08-18 NOTE — Progress Notes (Signed)
PROGRESS NOTE   Subjective/Complaints: Sitting in chair this AM. Reports decreased sleep last night. No other concerns.   ROS: + constipation-improved. No SOB,cough, new vision changes, chest pain, no N/V. Limited d/t aphasia.   Objective:   No results found. No results for input(s): "WBC", "HGB", "HCT", "PLT" in the last 72 hours.  No results for input(s): "NA", "K", "CL", "CO2", "GLUCOSE", "BUN", "CREATININE", "CALCIUM" in the last 72 hours.   Intake/Output Summary (Last 24 hours) at 08/18/2022 1013 Last data filed at 08/18/2022 0900 Gross per 24 hour  Intake 351 ml  Output --  Net 351 ml         Physical Exam: Vital Signs Blood pressure 116/61, pulse 96, temperature (!) 97.5 F (36.4 C), resp. rate 15, weight 59.4 kg, SpO2 100 %.  General: No acute distress, Sitting in bedside chair Mood and affect are appropriate HEENT: conjugate gaze, MMM Heart: Regular rate and rhythm no rubs murmurs or extra sounds, normal rate Lungs: Clear to auscultation, breathing unlabored, no rales or wheezes.  Abdomen: Positive bowel sounds, soft nontender to palpation, nondistended Extremities: No clubbing, cyanosis, or edema Skin: warm and dry, no breakdown noted   Neurologic: Intact comprehension, nods head to most questions, Cranial nerves II through XII intact. LUE flaccid.   PE from prior encounter:  motor strength is 5/5 in right , 0/5 left deltoid, bicep, tricep, grip, hip flexor, knee extensors, ankle dorsiflexor and plantar flexor Sensory exam pt reports some sensation but impaired LUE>LLE Musculoskeletal: Full range of motion in all 4 extremities. No joint swelling or tenderness noted   Assessment/Plan: 1. Functional deficits which require 3+ hours per day of interdisciplinary therapy in a comprehensive inpatient rehab setting. Physiatrist is providing close team supervision and 24 hour management of active medical  problems listed below. Physiatrist and rehab team continue to assess barriers to discharge/monitor patient progress toward functional and medical goals  Care Tool:  Bathing    Body parts bathed by patient: Left arm, Chest, Abdomen, Face   Body parts bathed by helper: Right arm     Bathing assist Assist Level: Moderate Assistance - Patient 50 - 74%     Upper Body Dressing/Undressing Upper body dressing   What is the patient wearing?: Pull over shirt    Upper body assist Assist Level: Moderate Assistance - Patient 50 - 74%    Lower Body Dressing/Undressing Lower body dressing      What is the patient wearing?: Pants     Lower body assist Assist for lower body dressing: Moderate Assistance - Patient 50 - 74%     Toileting Toileting    Toileting assist Assist for toileting: Maximal Assistance - Patient 25 - 49%     Transfers Chair/bed transfer  Transfers assist  Chair/bed transfer activity did not occur: Safety/medical concerns  Chair/bed transfer assist level: Moderate Assistance - Patient 50 - 74%     Locomotion Ambulation   Ambulation assist   Ambulation activity did not occur: Safety/medical concerns          Walk 10 feet activity   Assist  Walk 10 feet activity did not occur: Safety/medical concerns  Walk 50 feet activity   Assist Walk 50 feet with 2 turns activity did not occur: Safety/medical concerns         Walk 150 feet activity   Assist Walk 150 feet activity did not occur: Safety/medical concerns         Walk 10 feet on uneven surface  activity   Assist Walk 10 feet on uneven surfaces activity did not occur: Safety/medical concerns         Wheelchair     Assist Is the patient using a wheelchair?: Yes Type of Wheelchair: Manual    Wheelchair assist level: Dependent - Patient 0% Max wheelchair distance: 150    Wheelchair 50 feet with 2 turns activity    Assist        Assist Level:  Dependent - Patient 0%   Wheelchair 150 feet activity     Assist      Assist Level: Dependent - Patient 0%   Blood pressure 116/61, pulse 96, temperature (!) 97.5 F (36.4 C), resp. rate 15, weight 59.4 kg, SpO2 100 %.  Medical Problem List and Plan: 1. 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  Antithrombotics: -DVT/anticoagulation:  Pharmaceutical: cont Lovenox , doppler without evidence of DVT              -antiplatelet therapy: continue Plavix and aspirin as PTA 3. Pain Management: tylenol as needed 4. Depression/insomnia: LCSW to evaluate and provide emotional support             -antipsychotic agents: n/a             -bupropion ER 150 mg q AM             -continue mirtazapine 30 mg q HS             -alprazolam 0.5 mg daily prn anxiety  -10/7 start melatonin PRN 5. Neuropsych/cognition: This patient is capable of making decisions on her own behalf. 6. Skin/Wound Care: routine skin care checks 7. Dysphagia: routine Is and Os and follow-up chemistries             continue Dysphagia 1 diet/thin-SLP eval-  -explained the importance of her nutrition  -10/4 dys 2 diet, thin liquid started yesterday  -10/5 eating 75-100% diet, continue to follow 9: Hypertension: continue to monitor;  avoid hypotension             -continue Cardizem 180 mg daily   Vitals:   08/17/22 1921 08/18/22 0431  BP: (!) 150/70 116/61  Pulse: (!) 102 96  Resp: 16 15  Temp: 98.4 F (36.9 C) (!) 97.5 F (36.4 C)  SpO2: 100% 100%   Controlled HR borderline tachy  - 10/1 - WNL, monitor -10/7 overall well controlled, continue to monitor 10: Hyperlipidemia: LDL = 125, counsel regarding healthy diet              -continue  ezetimibe 10 mg daily             -continue atorvastatin 90 mg daily 11: Tobacco use: cessation counseling             -  continue nicotine patch 21 mg 12. Constipation             -Order sorbitol  -small,hard bm 9/22             - 10/1 - appears all BMS small or smears since admission. Added BID miralax, PM sennakot-S scheduled.   -10/7 LBM yesterday, improved 13. GERD             -continue PPI 14.  Dysarthria improving continue SLP  -On dys 2 diet with thin liquids 15. Sneezing and coughing: respiratory panel negative- appears better today , ? Allergy  16. Insomnia     LOS: 17 days A FACE TO FACE EVALUATION WAS PERFORMED  Jennye Boroughs 08/18/2022, 10:13 AM

## 2022-08-19 MED ORDER — BISACODYL 10 MG RE SUPP
10.0000 mg | Freq: Every day | RECTAL | Status: DC | PRN
  Administered 2022-08-19: 10 mg via RECTAL
  Filled 2022-08-19: qty 1

## 2022-08-19 NOTE — Progress Notes (Signed)
Occupational Therapy Session Note  Patient Details  Name: Kara Bailey MRN: [id number] Date of Birth: 15-Mar-1956  Today's Date: 08/19/2022 OT Individual Time: 1000-1115 OT Individual Time Calculation (min): 75 min    Short Term Goals: Week 3:  OT Short Term Goal 1 (Week 3): Pt will return demo self-ROM techniques with independence on 2 sessions to demo recall and understanding of LUE NMR. OT Short Term Goal 2 (Week 3): Pt will complete toileting tasks with moderate assistance x 1.  Skilled Therapeutic Interventions/Progress Updates:    Upon OT arrival, pt seated in w/c reporting no pain and is agreeable to OT treatment session. Treatment intervention with a focus on self care retraining, standing balance, dynamic sitting balance, and functional transfers. Pt was positioned infront of sink and completes oral care with Setup assist to manage toothpaste but otherwise is able to brush her teeth and wash her face with SBA. Upper body dressing retraining completed with SBA seated in w/c with min verbal cues provided and increased time to complete. Pt performs hair care with SBA seated at sink. Pt was transported to dayroom gym via w/c and total A for time and completes squat pivot transfer from w/c to EOM with Mod A. While seated EOM with Min A-CGA, pt retrieves bean bags from the floor on B sides using the reacher in the R UE and crosses midline to place into the basket located to the L of pt. Pt with decreased endurance and strength requiring Min A to return to sitting upright the longer pt completes task. Pt requires rest breaks. Pt then retrieves bean bags from basket and places them on the floor using reacher in the R UE. Pt completes with Min A-CGA. Pt then performs 9 sit<>stand transfers with Mod A to place playing cards onto matching card on vertical board located infront of pt with Mod A. Pt requires mod verbal and tactile cuing for body positioning and motor planning movement for transfers. Pt  also requires min verbal cues to lean to the R when standing upright. Pt reports fatigue during task and requires short rest break to place all 9 cards onto board. Pt performs slideboard transfer to the R with Min A and verbal cues for hand placement and foot placement. Pt was returned to her room via w/c and left with NT secondary to pt requesting to use bathroom at end of session.   Therapy Documentation Precautions:  Precautions Precautions: Fall Precaution Comments: L hemi. expressive aphasia Restrictions Weight Bearing Restrictions: No   Therapy/Group: Individual Therapy  [name] 08/19/2022, 10:17 AM

## 2022-08-19 NOTE — Progress Notes (Signed)
Pt required disimpaction with moderate results.     Gerald Stabs, RN

## 2022-08-20 LAB — BASIC METABOLIC PANEL
Anion gap: 9 (ref 5–15)
BUN: 13 mg/dL (ref 8–23)
CO2: 27 mmol/L (ref 22–32)
Calcium: 9.7 mg/dL (ref 8.9–10.3)
Chloride: 109 mmol/L (ref 98–111)
Creatinine, Ser: 0.96 mg/dL (ref 0.44–1.00)
GFR, Estimated: >60 mL/min (ref >60)
Glucose, Bld: 91 mg/dL (ref 70–99)
Potassium: 4.1 mmol/L (ref 3.5–5.1)
Sodium: 145 mmol/L (ref 135–145)

## 2022-08-20 LAB — CBC
HCT: 32.8 % — ABNORMAL LOW (ref 36.0–46.0)
Hemoglobin: 10.5 g/dL — ABNORMAL LOW (ref 12.0–15.0)
MCH: 29.8 pg (ref 26.0–34.0)
MCHC: 32 g/dL (ref 30.0–36.0)
MCV: 93.2 fL (ref 80.0–100.0)
Platelets: 365 10*3/uL (ref 150–400)
RBC: 3.52 MIL/uL — ABNORMAL LOW (ref 3.87–5.11)
RDW: 14.2 % (ref 11.5–15.5)
WBC: 7.6 10*3/uL (ref 4.0–10.5)
nRBC: 0 % (ref 0.0–0.2)

## 2022-08-20 MED ORDER — SENNOSIDES-DOCUSATE SODIUM 8.6-50 MG PO TABS
1.0000 | ORAL_TABLET | Freq: Two times a day (BID) | ORAL | Status: DC
  Administered 2022-08-20 – 2022-09-07 (×36): 1 via ORAL
  Filled 2022-08-20 (×36): qty 1

## 2022-08-20 NOTE — Progress Notes (Signed)
Occupational Therapy Session Note  Patient Details  Name: Kara Bailey MRN: [id number] Date of Birth: 1956-01-29  Today's Date: 08/20/2022 OT Individual Time: 1660-6004 OT Individual Time Calculation (min): 60 min    Short Term Goals: Week 3:  OT Short Term Goal 1 (Week 3): Pt will return demo self-ROM techniques with independence on 2 sessions to demo recall and understanding of LUE NMR. OT Short Term Goal 2 (Week 3): Pt will complete toileting tasks with moderate assistance x 1.  Skilled Therapeutic Interventions/Progress Updates:    Pt received in bed, eager to get to the bathroom as she had already wet her brief waiting for assist. NT present for first 10 min in case +2 A needed, but it was not.  Pt able to sit to EOB with CGA, completed several squat pivots this session all with min A and min cues for technique (bed to wc, w/c >< toilet, w/c >< tub bench in walk in shower).  See ADL documentation below for details.  Pt has been doing well with hemi bathing and dressing techniques.  Due to impaired dynamic standing balance exacerbated by tone in LLE, have been focusing on pt holding static standing using R hand on bar or bed rail so helper can adjust clothing for her. Pt able to hold static stand with light CGA.  She has been progressing well.  Pt resting in w/c with lap tray on.  All needs met. ( Per pt request, applied baby oil to her feet, pt sitting with feet on towel and socks and shoes off to let feet dry. Asked next therapist to don shoes.)  Therapy Documentation Precautions:  Precautions Precautions: Fall Precaution Comments: L hemi. expressive aphasia Restrictions Weight Bearing Restrictions: No    Pain:  No c/o pain during therapy ADL: ADL Eating: Supervision/safety Grooming: Supervision/safety (Pt able to complete oral care and facial hygiene with supervision sitting at the sink.) Where Assessed-Grooming: Wheelchair, Sitting at sink Upper Body Bathing:  Supervision/safety, Minimal cueing (using a long sponge to reach RUE) Where Assessed-Upper Body Bathing: Shower Lower Body Bathing: Minimal assistance (A to wash bottom) Where Assessed-Lower Body Bathing: Shower Upper Body Dressing: Minimal assistance Where Assessed-Upper Body Dressing: Wheelchair Lower Body Dressing:  (dressing from wc level, standing with R hand on bar for therapist to pull pants over hips) Where Assessed-Lower Body Dressing: Wheelchair Toileting: Moderate assistance (A with clothing management) Where Assessed-Toileting: Glass blower/designer: Minimal assistance (using bar for support, sq pivot) Toilet Transfer Method: Engineer, water: Energy manager: Environmental education officer Method: Education officer, environmental: Radio broadcast assistant, Grab bars ADL Comments: Pt able to recall hemi-techniques with grooming tasks learned in previous OT session.      Therapy/Group: Individual Therapy  Lenis Nettleton 08/20/2022, 12:28 PM

## 2022-08-20 NOTE — Progress Notes (Signed)
Physical Therapy Session Note  Patient Details  Name: Kara Bailey MRN: [id number] Date of Birth: April 18, 1956  Today's Date: 08/20/2022 PT Individual Time: 1117-1201 PT Individual Time Calculation (min): 44 min   Short Term Goals: Week 2:  PT Short Term Goal 1 (Week 2): pt will perform bed mobiltiy with mod assist PT Short Term Goal 1 - Progress (Week 2): Met PT Short Term Goal 2 (Week 2): Pt will transfer to [facility] with mod assist PT Short Term Goal 2 - Progress (Week 2): Met PT Short Term Goal 3 (Week 2): Pt will propell WC 169f with min assist PT Short Term Goal 3 - Progress (Week 2): Met PT Short Term Goal 4 (Week 2): Pt will perform sit<>stand with mod assist consistently PT Short Term Goal 4 - Progress (Week 2): Met Week 3:  PT Short Term Goal 1 (Week 3): [facility] Short Term Goal 2 (Week 3): Pt wil ambulate 532fwith max A +2. PT Short Term Goal 3 (Week 3): Pt will propell WC 15072fith CGA PT Short Term Goal 4 (Week 3): Pt will perform 5xSTS to assess fall risk  Skilled Therapeutic Interventions/Progress Updates:  Patient seated upright in w/c on entrance to room. Patient alert and agreeable to PT session. Feet have been placed on towel d/t having baby oil applied to Bil feet by OT.   Patient with no pain complaint at start of session.  Therapeutic Activity: Transfers: While seated, Bil socks and shoes donned MaxA with AFO to LLE. Pt performed stand/ squat pivot transfer w/c to mat table requiring NDT facilitation at ribcage for effective forward lean, at hip hips in order to produce forward scoot in seat as well as to improve effort in rise to stand. Pt allowed to fail if requriing more than ModA. Unable to produce squat pivot with good hand placement, but instead pushes from w/c with RUE to stand, then repositioning R hand to mat table and pivoting on  ball of R foot. VC throughout for tehcnique and sequencing.   Gait Training:  Pt  ambulated 60'721/ 25' x1 using HW and MaxA for LLE advancement.  +2 for w/c follow for safety in fatigue. Demonstrated 50% assist for L hip extension and initiation of LLE advancement, MaxA for knee stability to prevent buckle as well as hyperextension. 70% cues for sequencing AD with steps. Provided vc/ tc for upright posture, level gaze, hip extension for LLE and RLE during stance phase.  Neuromuscular Re-ed: NMR facilitated during session with focus on standing balance. Pt guided in NDT facilitaion during blocked practice for sit<>stand. Facilitation provided at ribcage and with overhead reach down pt's back to facilitate hip hinge>trunk flexion during forward lean. Improved weight shift forward and lift from seat.  NMR performed for improvements in motor control and coordination, balance, sequencing, judgement, and self confidence/ efficacy in performing all aspects of mobility at highest level of independence.   Patient seated upright at end of session with brakes locked, belt alarm set, and all needs within reach.  Therapy Documentation Precautions:  Precautions Precautions: Fall Precaution Comments: L hemi. expressive aphasia Restrictions Weight Bearing Restrictions: No General:   Vital Signs:  Pain:  No pain related this session.   Therapy/Group: Individual Therapy  JulAlger Simons, DPT, CSRS 08/20/2022, 12:51 PM

## 2022-08-20 NOTE — Progress Notes (Signed)
Speech Language Pathology Daily Session Note  Patient Details  Name: Kara Bailey MRN: [id number] Date of Birth: 07/07/1956  Today's Date: 08/20/2022 SLP Individual Time: 0800-0900 SLP Individual Time Calculation (min): 60 min  Short Term Goals: Week 3: SLP Short Term Goal 1 (Week 3): Patient will communicate functional wants/needs/wishes/feelings through multimodal communication and sup A verbal/visual cues for effectiveness SLP Short Term Goal 2 (Week 3): Patient will approximate production of single syllable words with sup A verbal/visual cues to achieve 50% intelligibility SLP Short Term Goal 3 (Week 3): Patient will sustain vowel sound for up 3 seconds during 3 of 5 occasions with mod A cues SLP Short Term Goal 4 (Week 3): Patient will utilize low tech communcation supports Passenger transport manager) and/or written communication to convey message with mod I SLP Short Term Goal 5 (Week 3): Pt will consume dysphagia 2 textures with thin liquids with improved awareness of bolus/spillage, oral residuals, and without overt s/sx of aspiration with sup A to implement swallowing precautions and strategies  Skilled Therapeutic Interventions: Skilled ST treatment focused on speech and swallowing goals. Pt was consuming breakfast in bed on arrival with moderate spillage of puree on left. Upon acknowledging SLP, pt quickly wiped her lip/chin but required additional assist to clear entirely. Pt consumed pureed textures and thin liquids without overt s/sx of aspiration. There were no dysphagia 2 textures on pt's selected meal tray. Pt does not typically consume a large breakfast per her report. Pt performed oral care with set-up A. From a speech standpoint, pt was initially perceived as <25% intelligble at the word level with significantly reduced breath support for speech. With a combination of speech exercises, breath support training with diaphragm breathing with min A verbal cues, and consuming sips of water  (pt reported dryness in throat), pt was able to progress to word and short sentence level production with 50-75% intelligibility with great carry over of techniques as session progressed. Speech exercises consisted of word level production with phrase completion (e.g., "salt and ___"), and sentence correction (e.g., you brush your hair with a fork; correct answer: "comb"). Pt very pleased with progress made during session. Patient was left in bed with alarm activated and immediate needs within reach at end of session. Continue per current plan of care.      Pain  None/denied  Therapy/Group: Individual Therapy  Patty Sermons 08/20/2022, 8:18 AM

## 2022-08-20 NOTE — Progress Notes (Signed)
Occupational Therapy Session Note  Patient Details  Name: Kara Bailey MRN: [id number] Date of Birth: [birth year]  Today's Date: 08/20/2022 OT Individual Time: 8832-5498 OT Individual Time Calculation (min): 28 min    Short Term Goals: Week 3:  OT Short Term Goal 1 (Week 3): Pt will return demo self-ROM techniques with independence on 2 sessions to demo recall and understanding of LUE NMR. OT Short Term Goal 2 (Week 3): Pt will complete toileting tasks with moderate assistance x 1.  Skilled Therapeutic Interventions/Progress Updates:  Pt greeted seated in w/c, pt agreeable to Ot intervention. Total A transport to day room where session focus on LUE AAROM. Pt needing MIN A at elbow to work on shoulder flexion by rolling ball forward/backward along EOM, noted trace movements in L shoulder.  Assisted pt into saebo arm mobilizer with pt working on gravity eliminated AAROM. Pt compensates by using accessory trunk muscles but does demonstrate trace movement in L shoulder to work on scapular protraction/retraction and elbow flexion/extension although no active movement noted at elbow without assist from arm mobilizer. Pt noted to be impacted by fatigue during session noted to close her eyes while working in arm mobilizer.  Pt transported back to room in w/c with total A with all needs within reach.   Therapy Documentation Precautions:  Precautions Precautions: Fall Precaution Comments: L hemi. expressive aphasia Restrictions Weight Bearing Restrictions: No  Pain: No pain    Therapy/Group: Individual Therapy  Precious Haws 08/20/2022, 3:47 PM

## 2022-08-20 NOTE — Progress Notes (Signed)
PROGRESS NOTE   Subjective/Complaints:  No issues overnite   ROS:  No SOB,cough,chest pain, no N/V. Limited d/t aphasia.   Objective:   No results found. Recent Labs    08/20/22 0616  WBC 7.6  HGB 10.5*  HCT 32.8*  PLT 365    Recent Labs    08/20/22 0616  NA 145  K 4.1  CL 109  CO2 27  GLUCOSE 91  BUN 13  CREATININE 0.96  CALCIUM 9.7     Intake/Output Summary (Last 24 hours) at 08/20/2022 0853 Last data filed at 08/19/2022 1827 Gross per 24 hour  Intake 477 ml  Output --  Net 477 ml         Physical Exam: Vital Signs Blood pressure 124/74, pulse 88, temperature (!) 97.5 F (36.4 C), resp. rate 16, weight 59.4 kg, SpO2 100 %.  General: No acute distress Mood and affect are appropriate Heart: Regular rate and rhythm no rubs murmurs or extra sounds Lungs: Clear to auscultation, breathing unlabored, no rales or wheezes Abdomen: Positive bowel sounds, soft nontender to palpation, nondistended Extremities: No clubbing, cyanosis, or edema Skin: No evidence of breakdown, no evidence of rash  Neurologic: Intact comprehension, nods head to most questions, Cranial nerves II through XII intact. LUE flaccid.   PE from prior encounter:  motor strength is 5/5 in right , 0/5 left deltoid, bicep, tricep, grip, hip flexor, knee extensors, ankle dorsiflexor and plantar flexor Sensory exam pt reports some sensation but impaired LUE>LLE Musculoskeletal: Full range of motion in all 4 extremities. No joint swelling or tenderness noted   Assessment/Plan: 1. Functional deficits which require 3+ hours per day of interdisciplinary therapy in a comprehensive inpatient rehab setting. Physiatrist is providing close team supervision and 24 hour management of active medical problems listed below. Physiatrist and rehab team continue to assess barriers to discharge/monitor patient progress toward functional and medical  goals  Care Tool:  Bathing    Body parts bathed by patient: Left arm, Chest, Abdomen, Face   Body parts bathed by helper: Right arm     Bathing assist Assist Level: Moderate Assistance - Patient 50 - 74%     Upper Body Dressing/Undressing Upper body dressing   What is the patient wearing?: Pull over shirt    Upper body assist Assist Level: Supervision/Verbal cueing    Lower Body Dressing/Undressing Lower body dressing      What is the patient wearing?: Pants     Lower body assist Assist for lower body dressing: Moderate Assistance - Patient 50 - 74%     Toileting Toileting    Toileting assist Assist for toileting: Maximal Assistance - Patient 25 - 49%     Transfers Chair/bed transfer  Transfers assist  Chair/bed transfer activity did not occur: Safety/medical concerns  Chair/bed transfer assist level: Moderate Assistance - Patient 50 - 74%     Locomotion Ambulation   Ambulation assist   Ambulation activity did not occur: Safety/medical concerns          Walk 10 feet activity   Assist  Walk 10 feet activity did not occur: Safety/medical concerns        Walk 50 feet  activity   Assist Walk 50 feet with 2 turns activity did not occur: Safety/medical concerns         Walk 150 feet activity   Assist Walk 150 feet activity did not occur: Safety/medical concerns         Walk 10 feet on uneven surface  activity   Assist Walk 10 feet on uneven surfaces activity did not occur: Safety/medical concerns         Wheelchair     Assist Is the patient using a wheelchair?: Yes Type of Wheelchair: Manual    Wheelchair assist level: Dependent - Patient 0% Max wheelchair distance: 150    Wheelchair 50 feet with 2 turns activity    Assist        Assist Level: Dependent - Patient 0%   Wheelchair 150 feet activity     Assist      Assist Level: Dependent - Patient 0%   Blood pressure 124/74, pulse 88, temperature  (!) 97.5 F (36.4 C), resp. rate 16, weight 59.4 kg, SpO2 100 %.  Medical Problem List and Plan: 1. 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  Antithrombotics: -DVT/anticoagulation:  Pharmaceutical: cont Lovenox , doppler without evidence of DVT              -antiplatelet therapy: continue Plavix and aspirin as PTA 3. Pain Management: tylenol as needed 4. Depression/insomnia: LCSW to evaluate and provide emotional support             -antipsychotic agents: n/a             -bupropion ER 150 mg q AM             -continue mirtazapine 30 mg q HS             -alprazolam 0.5 mg daily prn anxiety  -10/7 start melatonin PRN 5. Neuropsych/cognition: This patient is capable of making decisions on her own behalf. 6. Skin/Wound Care: routine skin care checks 7. Dysphagia: routine Is and Os and follow-up chemistries             continue Dysphagia 2 diet/thin-SLP    9: Hypertension: continue to monitor;  avoid hypotension             -continue Cardizem 180 mg daily   Vitals:   08/20/22 0458 08/20/22 0816  BP: 122/74 124/74  Pulse: 88   Resp: 16   Temp: (!) 97.5 F (36.4 C)   SpO2: 100%    -10/9 overall well controlled, continue to monitor 10: Hyperlipidemia: LDL = 125, counsel regarding healthy diet             -continue  ezetimibe 10 mg daily             -continue atorvastatin 90 mg daily 11: Tobacco use: cessation counseling             -continue nicotine patch 21 mg 12. Constipation           -LBM 10/8 medium , hard - increase Senna S to BID , cont  BID miralax 13. GERD             -continue PPI 14.  Dysarthria improving continue SLP  -On dys 2 diet with thin liquids 15. Sneezing and  coughing: respiratory panel negative- appears better today , ? Allergy  16. Insomnia     LOS: 19 days A FACE TO FACE EVALUATION WAS PERFORMED  Kara Bailey 08/20/2022, 8:53 AM

## 2022-08-20 NOTE — Progress Notes (Signed)
Orthopedic Tech Progress Note Patient Details:  Kara Bailey 12/31/1955 [id number]  Called in  order to [facility] for an AFO CONSULT    Patient ID: Kara Bailey, female   DOB: 09/30/1956, 66 y.o.   MRN: [id number]  Kara Bailey 08/20/2022, 1:16 PM

## 2022-08-21 NOTE — Progress Notes (Signed)
Physical Therapy Session Note  Patient Details  Name: Kara Bailey MRN: [id number] Date of Birth: [birth year]  Today's Date: 08/21/2022 PT Individual Time: 0947-1020 PT Individual Time Calculation (min): 33 min   Short Term Goals: Week 3:  PT Short Term Goal 1 (Week 3): Pt will transfer to and from Kara Bailey with min assist PT Short Term Goal 2 (Week 3): Pt wil ambulate 38f with max A +2. PT Short Term Goal 3 (Week 3): Pt will propell WC 154fwith CGA PT Short Term Goal 4 (Week 3): Pt will perform 5xSTS to assess fall risk  Skilled Therapeutic Interventions/Progress Updates:   Set up at sink for oral hygiene due to not finishing this morning. Pt able to perform with setup assistance needed for toothpaste cap but used modified technique to stabilize toothbrush. Total assist for transport to and from therapy for time management. Focused on pt setting up w/c for transfer with cues and education on set up of leg rest management and brakes. Pt requires min assist to scoot forward in the w/c and cues for head/hips relationship. Mod assist for squat pivot to mat table to the R to facilitate anterior weighshift, head/hips relationship, and activation through BLE to aid with clearance. EOM worked on reciprocal scooting  to the R and then L with focus on attention to body awareness. Blocked practice NMR for sit <> stands with focus on transitional movement for anterior weightshift, forced WB to the L, and postural control retraining - pt with lean to the L and facilitation needed to activate LLE in stance. Tendency to maintain flexed posture, decreased willingness to remove RUE support, and L lateral lean. Required mod assist overall.  Transferred back to w/c at end of session with stand pivot min/mod assist with improved sit > stand technique.   Therapy Documentation Precautions:  Precautions Precautions: Fall Precaution Comments: L hemi. expressive aphasia Restrictions Weight Bearing Restrictions:  No    Pain:  No complaints of pain.     Therapy/Group: Individual Therapy  GrCanary BrimrIvory BroadPT, DPT, CBIS  08/21/2022, 10:34 AM

## 2022-08-21 NOTE — Progress Notes (Signed)
Physical Therapy Session Note  Patient Details  Name: Kara Bailey MRN: [id number] Date of Birth: 07-Feb-1956  Today's Date: 08/21/2022 PT Individual Time: 1303-1408 PT Individual Time Calculation (min): 65 min   Short Term Goals: Week 3:  PT Short Term Goal 1 (Week 3): Pt will transfer to and from [facility] with min assist PT Short Term Goal 2 (Week 3): Pt wil ambulate 65f with max A +2. PT Short Term Goal 3 (Week 3): Pt will propell WC 1517fwith CGA PT Short Term Goal 4 (Week 3): Pt will perform 5xSTS to assess fall risk  Skilled Therapeutic Interventions/Progress Updates:     [facility]. No c/o pain throughout session.   Transported pt to and from main therapy gym for time management and energy conservation.   CPO and PT performed assessment of pt standing and ambulation for AFO recommendations. Recommending a Sprystep Max GRAFO and L toe cap.   Gait training:  1071f 2 with Max assist and R hemi-walker. Pt wearing Sprystep Max GRAFO on L LE. Severely slow gait speed.  Pt demonstrating the following gait deviations and was provided the described cuing and facilitation for improvement:  - improved upright posture due to pt presenting with a fwd flexed posture with right lateral lean  - Cues to facilitate L knee TKE during initial contact and stance phase, which pt struggled with performing quad contraction. Heavy L knee blocking performed to prevent buckling  -Cues to perform L LE swing through. Pt able to minimally initiate L LE swing through, but max assist required to manually move L LE fwd.  - decreased weight shifting occurring resulting in difficulty with swing through of both LE's and manual facilitation of weight shift performed frequently.   Transfer training:  Sit<> stand x5 from EOM to right hemi-walker with Max assist for trunk control, balance, and L knee stabilization. Verbal, tactile, and visual cues given to promote leaning forward  to initiate standing, which pt was able to demonstrate correction back. Cues for upright and midline posture upon standing with mirror feedback.  Squat pivot transfer to the R from EOM to [facility]. Pt having difficulty with scooting and cues given for safe hand, foot, and body positioning.   Balance:  Static standing balance challenged starting with max assist and fading to mod assist. Pt has heavy L lean and difficulty with initiating right weight shift. Pt able to perform ~5mi18mnd 2min43m standing  before needing to sit down and rest due to fatigue. Mirror feedback helpful in regaining upright posture.   Pt left seated in WC with posey belt on, call bell in reach, brakes locked, all needs met.  Therapy Documentation Precautions:  Precautions Precautions: Fall Precaution Comments: L hemi. expressive aphasia Restrictions Weight Bearing Restrictions: No  Therapy/Group: Individual Therapy  [name], 3:12 PM

## 2022-08-21 NOTE — Progress Notes (Signed)
PROGRESS NOTE   Subjective/Complaints:  No issues overnite   ROS:  No SOB,cough,chest pain, no N/V. Limited d/t severe dysarthria   Objective:   No results found. Recent Labs    08/20/22 0616  WBC 7.6  HGB 10.5*  HCT 32.8*  PLT 365    Recent Labs    08/20/22 0616  NA 145  K 4.1  CL 109  CO2 27  GLUCOSE 91  BUN 13  CREATININE 0.96  CALCIUM 9.7     Intake/Output Summary (Last 24 hours) at 08/21/2022 0910 Last data filed at 08/20/2022 1838 Gross per 24 hour  Intake 471 ml  Output --  Net 471 ml         Physical Exam: Vital Signs Blood pressure 136/74, pulse 84, temperature 97.8 F (36.6 C), resp. rate 14, weight 59.4 kg, SpO2 100 %.  General: No acute distress Mood and affect are appropriate Heart: Regular rate and rhythm no rubs murmurs or extra sounds Lungs: Clear to auscultation, breathing unlabored, no rales or wheezes Abdomen: Positive bowel sounds, soft nontender to palpation, nondistended Extremities: No clubbing, cyanosis, or edema Skin: No evidence of breakdown, no evidence of rash  Neurologic: Intact comprehension, nods head to most questions, Cranial nerves II through XII intact. LUE flaccid.   PE from prior encounter:  motor strength is 5/5 in right , 0/5 left deltoid, bicep, tricep, grip, hip flexor, knee extensors, ankle dorsiflexor and plantar flexor Sensory exam pt reports some sensation but impaired LUE>LLE Musculoskeletal: Full range of motion in all 4 extremities. No joint swelling or tenderness noted   Assessment/Plan: 1. Functional deficits which require 3+ hours per day of interdisciplinary therapy in a comprehensive inpatient rehab setting. Physiatrist is providing close team supervision and 24 hour management of active medical problems listed below. Physiatrist and rehab team continue to assess barriers to discharge/monitor patient progress toward functional and medical  goals  Care Tool:  Bathing    Body parts bathed by patient: Left arm, Chest, Abdomen, Face   Body parts bathed by helper: Right arm     Bathing assist Assist Level: Moderate Assistance - Patient 50 - 74%     Upper Body Dressing/Undressing Upper body dressing   What is the patient wearing?: Pull over shirt    Upper body assist Assist Level: Supervision/Verbal cueing    Lower Body Dressing/Undressing Lower body dressing      What is the patient wearing?: Pants     Lower body assist Assist for lower body dressing: Moderate Assistance - Patient 50 - 74%     Toileting Toileting    Toileting assist Assist for toileting: Maximal Assistance - Patient 25 - 49%     Transfers Chair/bed transfer  Transfers assist  Chair/bed transfer activity did not occur: Safety/medical concerns  Chair/bed transfer assist level: Moderate Assistance - Patient 50 - 74%     Locomotion Ambulation   Ambulation assist   Ambulation activity did not occur: Safety/medical concerns  Assist level: Maximal Assistance - Patient 25 - 49% Assistive device: Walker-hemi Max distance: 65 ft   Walk 10 feet activity   Assist  Walk 10 feet activity did not occur: Safety/medical concerns  Assist level: Maximal Assistance - Patient 25 - 49% Assistive device: Walker-hemi   Walk 50 feet activity   Assist Walk 50 feet with 2 turns activity did not occur: Safety/medical concerns         Walk 150 feet activity   Assist Walk 150 feet activity did not occur: Safety/medical concerns         Walk 10 feet on uneven surface  activity   Assist Walk 10 feet on uneven surfaces activity did not occur: Safety/medical concerns         Wheelchair     Assist Is the patient using a wheelchair?: Yes Type of Wheelchair: Manual    Wheelchair assist level: Dependent - Patient 0% Max wheelchair distance: 150    Wheelchair 50 feet with 2 turns activity    Assist        Assist  Level: Dependent - Patient 0%   Wheelchair 150 feet activity     Assist      Assist Level: Dependent - Patient 0%   Blood pressure 136/74, pulse 84, temperature 97.8 F (36.6 C), resp. rate 14, weight 59.4 kg, SpO2 100 %.  Medical Problem List and Plan: 1. 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  Antithrombotics: -DVT/anticoagulation:  Pharmaceutical: cont Lovenox , doppler without evidence of DVT              -antiplatelet therapy: continue Plavix and aspirin as PTA 3. Pain Management: tylenol as needed 4. Depression/insomnia: LCSW to evaluate and provide emotional support             -antipsychotic agents: n/a             -bupropion ER 150 mg q AM             -continue mirtazapine 30 mg q HS             -alprazolam 0.5 mg daily prn anxiety  -10/7 start melatonin PRN 5. Neuropsych/cognition: This patient is capable of making decisions on her own behalf. 6. Skin/Wound Care: routine skin care checks 7. Dysphagia: routine Is and Os and follow-up chemistries             continue Dysphagia 2 diet/thin-SLP    9: Hypertension: continue to monitor;  avoid hypotension             -continue Cardizem 180 mg daily   Vitals:   08/20/22 2000 08/21/22 0414  BP: 139/74 136/74  Pulse: 94 84  Resp: 17 14  Temp: 98.4 F (36.9 C) 97.8 F (36.6 C)  SpO2: 99% 100%   -10/9 overall well controlled, continue to monitor 10: Hyperlipidemia: LDL = 125, counsel regarding healthy diet             -continue  ezetimibe 10 mg daily             -continue atorvastatin 90 mg daily 11: Tobacco use: cessation counseling             -continue nicotine patch 21 mg 12. Constipation           -  LBM  10/8 medium , hard - increase Senna S to BID , cont BID miralax 13. GERD             -continue PPI 14.  Dysarthria improving continue SLP  -On dys 2 diet with thin liquids 15. Sneezing and coughing: respiratory panel negative- appears better today , ? Allergy  16. Insomnia     LOS: 20 days A FACE TO FACE EVALUATION WAS PERFORMED  Kara Bailey 08/21/2022, 9:10 AM

## 2022-08-21 NOTE — Progress Notes (Signed)
Occupational Therapy Session Note  Patient Details  Name: Kara Bailey MRN: [id number] Date of Birth: 10/27/1956  Today's Date: 08/21/2022 OT Individual Time: 6599-3570 and 1135-1205 OT Individual Time Calculation (min): 60 min and 30 min   Short Term Goals: Week 3:  OT Short Term Goal 1 (Week 3): Pt will return demo self-ROM techniques with independence on 2 sessions to demo recall and understanding of LUE NMR. OT Short Term Goal 2 (Week 3): Pt will complete toileting tasks with moderate assistance x 1.  Skilled Therapeutic Interventions/Progress Updates:    Visit 1: Pain:  no c/o pain  Pt received in bed after finishing with speech therapy.  Pt stated her brief was wet and she needed to be changed.  Pt stated she had not called for A that she just went. Placed bed flat for pt to practice getting up from.  CGA to roll to L and min to sit up.  Pt then completed squat pivot transfer to her R side to wc with cues for forward lean to weight shift hips.  Pt then transported to bathroom. Had pt pivot to regular toilet seat to her R using grab bar on her R with min A.  Then pt worked on sit to stand from low toilet with min A to have wet brief removed.  Pt sat down but no further void.  From sitting she worked on cleansing front area, then stood with bar with light CGA to S for therapist to cleanse back side. Sat down to transfer to L to wc with CGA using bar.  Pt transported to sit next to bed so she could use R hand on rail for support to stand as therapist donned brief. Pt can now stand with close S as long as she is using R hand for support.  Continued dressing, donning shirt with S in sitting and pants with max A. Donned shoes and AFO.  For last 10 min of session, worked on progressing from static stand with R hand support to no support focusing on pt's core control, pt focusing on midline control, use of her R ankle and hip to strategically balance. Pt able to release R hand for only 3-4 seconds  at a time before losing balance to her L.  Discussed how she has progressed significantly from admission to a level that her caregivers only need to do a light amount of A. The next level for her to be more independent is being able to stand without support.  Pt resting in wc with all needs met and nurse in the room.    Visit 2:  Pain: no c/o pain  Pt received in w/c and ready for therapy.  Used this session to problem solve how pt will need to complete transfer to toilet at home. Based on diagram her family left, used tub room bathroom to simulate the same set up. At home, pt will need to squat pivot to her Left. The tub is also on the left so no grab bar can be placed. She also has a sink vanity on R side of toilet so her chair will need to be placed at an angle.  Had pt practice a forward lean so she could adequately shift hips up to be able pivot hips to her L.  Due to limited space, helper can sit on tub wall versus standing in front.  Due to tight space and no bar, pt does need mod A vs min A.  To problem  solve how she will stand with clothing management, placed w/c backwards so she could place R hand on chair handle.  Hopefully the vanity of the sink will be long or tall enough for her to balance her R hand on as an option.  From low toilet, and no bar, she needed more of a mod A to stand.  Repeated transfer process to pivot to the R back to w/c.    Family will need to practice this transfer several times during family ed sessions. Also discussed tub transfers but recommended pt work with home health first before actually using her shower.  Pt returned to room with all needs met.      Therapy Documentation Precautions:  Precautions Precautions: Fall Precaution Comments: L hemi. expressive aphasia Restrictions Weight Bearing Restrictions: No   ADL: ADL Eating: Supervision/safety Grooming: Supervision/safety (Pt able to complete oral care and facial hygiene with supervision sitting at the  sink.) Where Assessed-Grooming: Wheelchair, Sitting at sink Upper Body Bathing: Supervision/safety, Minimal cueing (using a long sponge to reach RUE) Where Assessed-Upper Body Bathing: Shower Lower Body Bathing: Minimal assistance (A to wash bottom) Where Assessed-Lower Body Bathing: Shower Upper Body Dressing: Minimal assistance Where Assessed-Upper Body Dressing: Wheelchair Lower Body Dressing:  (dressing from wc level, standing with R hand on bar for therapist to pull pants over hips) Where Assessed-Lower Body Dressing: Wheelchair Toileting: Moderate assistance (A with clothing management) Where Assessed-Toileting: Glass blower/designer: Minimal assistance (using bar for support, sq pivot) Toilet Transfer Method: Engineer, water: Energy manager: Environmental education officer Method: Education officer, environmental: Radio broadcast assistant, Grab bars ADL Comments: Pt able to recall hemi-techniques with grooming tasks learned in previous OT session.    Therapy/Group: Individual Therapy  [place] 08/21/2022, 8:27 AM

## 2022-08-21 NOTE — Congregational Nurse Program (Unsigned)
  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 Yes    Student Assistance N/A    Location Patient [place]    Visit Setting with Client Hospital    Patient Status Unknown    Insurance Medicare    Insurance/Financial Assistance Referral N/A    Medication N/A    Medical Provider Yes    Screening Referrals Made N/A    Medical Referrals Made N/A    Medical Appointment Made N/A    Recently w/o PCP, now 1st time PCP visit completed due to CNs referral or appointment made N/A    Food N/A    Transportation N/A    Housing/Utilities N/A    Interpersonal Safety N/A    Interventions Spiritual Care;Educate;Reviewed D/C Planning    Abnormal to Normal Screening Since Last CN Visit N/A    Screenings CN Performed N/A    Did client attend any of the following based off CNs referral or appointments made? N/A    ED Visit Averted N/A    Life-Saving Intervention Made N/A            I visited Ms. Wigfall on Monday Oct 9th. Her daughter and granddaughter were with her. She is planning being discharged on Oct 7th to her home. Melissa her daughter plans being  her primary caregiver. She stated she spoke with the case manger who had's set up home care visits.

## 2022-08-21 NOTE — Progress Notes (Signed)
Speech Language Pathology Daily Session Note  Patient Details  Name: Kara Bailey MRN: [id number] Date of Birth: [birth year]  Today's Date: 08/21/2022 SLP Individual Time: 0800-0830 SLP Individual Time Calculation (min): 30 min  Short Term Goals: Week 3: SLP Short Term Goal 1 (Week 3): Patient will communicate functional wants/needs/wishes/feelings through multimodal communication and sup A verbal/visual cues for effectiveness SLP Short Term Goal 2 (Week 3): Patient will approximate production of single syllable words with sup A verbal/visual cues to achieve 50% intelligibility SLP Short Term Goal 3 (Week 3): Patient will sustain vowel sound for up 3 seconds during 3 of 5 occasions with mod A cues SLP Short Term Goal 4 (Week 3): Patient will utilize low tech communcation supports Passenger transport manager) and/or written communication to convey message with mod I SLP Short Term Goal 5 (Week 3): Pt will consume dysphagia 2 textures with thin liquids with improved awareness of bolus/spillage, oral residuals, and without overt s/sx of aspiration with sup A to implement swallowing precautions and strategies  Skilled Therapeutic Interventions: Skilled ST treatment focused on speech goals. Pt greeted in bed on arrival and agreeable to ST intervention. SLP facilitated repositioning in bed to optimize breath support for speech. SLP facilitated production of one, two, and three syllable words with mod fading to min A verbal cues to increase breath support with diaphragmatic breathing, increased vocal intensity, and separation of syllables to achieve 75-90% intelligibility. Prior to ST intervention, pt was perceived as ~25% intelligible using combination of word level production and multimodal communication of with gestures, and written communication. Pt continues to exhibit good carry over during session, with less carry over between sessions/day-to-day. Patient was left in bed with alarm activated and immediate  needs within reach at end of session. Continue per current plan of care.      Pain  None/denied  Therapy/Group: Individual Therapy  Grae Cannata T Bladen Umar 08/21/2022, 8:29 AM

## 2022-08-22 NOTE — Progress Notes (Signed)
Patient ID: Kara Bailey, female   DOB: [birth year], 66 y.o.   MRN: [id number]  Team Conference Report to Patient/Family  Team Conference discussion was reviewed with the patient and caregiver, including goals, any changes in plan of care and target discharge date.  Patient and caregiver express understanding and are in agreementin agreement.  The patient has a target discharge date of 08/28/22.  Sw met with patient and spoke with patient son and provided conference updates. Sw explained the role of HH to son. No additional questions or concerns.   Dyanne Iha 08/22/2022, 1:11 PM

## 2022-08-22 NOTE — Progress Notes (Signed)
Physical Therapy Session Note  Patient Details  Name: Kara Bailey MRN: [id number] Date of Birth: 20-Nov-1955  Today's Date: 08/22/2022 PT Individual Time: 0835-0900 PT Individual Time Calculation (min): 25 min   Short Term Goals:  Week 3:  PT Short Term Goal 1 (Week 3): Pt will transfer to and from [facility] with min assist PT Short Term Goal 2 (Week 3): Pt wil ambulate 27f with max A +2. PT Short Term Goal 3 (Week 3): Pt will propell WC 1520fwith CGA PT Short Term Goal 4 (Week 3): Pt will perform 5xSTS to assess fall risk   Skilled Therapeutic Interventions/Progress Updates:   Pt received supine in bed and agreeable to PT. Supine>sit transfer with min assist for control of the LLE on the R side of bed. Pt reports incontinent urine. Sit<>stand x 4 with mod assist to block LLE to doff/don brief and pants. Max assist for clothing management and max cues for attention/activation of the LLE into hip/knee extension with no noted activation on this day. PT assisted pt to don shoes and AFO with total A.  [facility] 1545fith max-total A for management of the LLE to advance and initiate hip extension to allow AFO to support knee. Poor ability to maintain midline on thie day with heavy L lateral lean. WC mobility through hall with min -superision assist from PT for improved coorination of RLE/RUE propulsion to prevent veer to the L. Improved technique with increased distance.   Patient returned to room and left sitting in [facility] in reach and all needs met.         Therapy Documentation Precautions:  Precautions Precautions: Fall Precaution Comments: L hemi. expressive aphasia Restrictions Weight Bearing Restrictions: No  Pain: denies   Therapy/Group: Individual Therapy  AusLorie Phenix/09/2022, 9:05 AM

## 2022-08-22 NOTE — Progress Notes (Signed)
Patient ID: Kara Bailey, female   DOB: 1956-06-12, 66 y.o.   MRN: [id number]  Hospital bed, hemi walker and Drop arm ordered through [employer].

## 2022-08-22 NOTE — Progress Notes (Signed)
Occupational Therapy Session Note  Patient Details  Name: Kara Bailey MRN: [id number] Date of Birth: 02/13/1956  Today's Date: 08/22/2022 OT Individual Time: 1300-1343 OT Individual Time Calculation (min): 43 min    Short Term Goals: Week 3:  OT Short Term Goal 1 (Week 3): Pt will return demo self-ROM techniques with independence on 2 sessions to demo recall and understanding of LUE NMR. OT Short Term Goal 2 (Week 3): Pt will complete toileting tasks with moderate assistance x 1.  Skilled Therapeutic Interventions/Progress Updates:    Pt resting in w/c upon arrival. Family not present for education. Pt practiced sit<>squat (x4) using grab bar to assist. Min a with input provided ot OTA through pt's Lt knee. Pt confirmed sketch of bathroom is correct (provided by OTR Kara Bailey.) Pt noticed that Lovenox injection site was bleeding. Bandaid placed over site and RN [employer] notified. Pt returned to room. Table in front with pt eating lunch. All needs within reach.   Therapy Documentation Precautions:  Precautions Precautions: Fall Precaution Comments: L hemi. expressive aphasia Restrictions Weight Bearing Restrictions: No  Pain: Pt denies pain this afternoon. No s/s of pain   Therapy/Group: Individual Therapy  Leroy Libman 08/22/2022, 1:44 PM

## 2022-08-22 NOTE — Progress Notes (Signed)
Physical Therapy Session Note  Patient Details  Name: Kara Bailey MRN: [id number] Date of Birth: 04/26/1956  Today's Date: 08/22/2022 PT Individual Time: 7412-8786 PT Individual Time Calculation (min): 57 min   Short Term Goals: Week 1:  PT Short Term Goal 1 (Week 1): Pt will perform bed mobility with max assist of 1 consistently PT Short Term Goal 1 - Progress (Week 1): Met PT Short Term Goal 2 (Week 1): Pt will sit EOB with CGA >3 mintues PT Short Term Goal 2 - Progress (Week 1): Met PT Short Term Goal 3 (Week 1): Pt will propell WC >100with min assist PT Short Term Goal 3 - Progress (Week 1): Met PT Short Term Goal 4 (Week 1): Pt will tolerate standing >1 min with max assist from PT PT Short Term Goal 4 - Progress (Week 1): Met Week 2:  PT Short Term Goal 1 (Week 2): pt will perform bed mobiltiy with mod assist PT Short Term Goal 1 - Progress (Week 2): Met PT Short Term Goal 2 (Week 2): Pt will transfer to [facility] with mod assist PT Short Term Goal 2 - Progress (Week 2): Met PT Short Term Goal 3 (Week 2): Pt will propell WC 146f with min assist PT Short Term Goal 3 - Progress (Week 2): Met PT Short Term Goal 4 (Week 2): Pt will perform sit<>stand with mod assist consistently PT Short Term Goal 4 - Progress (Week 2): Met Week 3:  PT Short Term Goal 1 (Week 3): [facility] Short Term Goal 2 (Week 3): Pt wil ambulate 546fwith max A +2. PT Short Term Goal 3 (Week 3): Pt will propell WC 15032fith CGA PT Short Term Goal 4 (Week 3): Pt will perform 5xSTS to assess fall risk Week 4:     Skilled Therapeutic Interventions/Progress Updates:   Pt received sitting in WC and agreeable to PT. Pt [facility] rehab gym [facility] to and from [facility] with mod assist progressing to min assist with cues for improved weight shift R to prevent LOB to the L. Partial stand from EOB x 10 with mod-max assist to facilitate weight shift to  the R to and tactile cues to improve activation of the LLE. Daughter then present for education. Transfer training with stedy to and from WC [facility] assist form elevated bed and min-mod assist from hemi height WC. Transfer training [place] with mod assist from PT for squat pivot and min assist with slide board. Transfer performed with daughter to car with and mod assist into car and max assist to exit with cues for improved blocking of the LLE. WC mobility through hall with min assist 2 x 100f85fth cues for sequencing of propulsion on the R side. Patient returned to room and [facility] and all needs met.         Therapy Documentation Precautions:  Precautions Precautions: Fall Precaution Comments: L hemi. expressive aphasia Restrictions Weight Bearing Restrictions: No  Vital Signs: Therapy Vitals Temp: 97.6 F (36.4 C) Pulse Rate: 99 Resp: 15 BP: 137/75 Patient Position (if appropriate): Sitting Oxygen Therapy SpO2: 100 % O2 Device: Room Air Pain:denies   Therapy/Group: Individual Therapy  AustLorie Phenix11/2023, 3:34 PM

## 2022-08-22 NOTE — Progress Notes (Signed)
Speech Language Pathology Daily Session Note  Patient Details  Name: Kara Bailey MRN: [id number] Date of Birth: Jul 05, 1956  Today's Date: 08/22/2022 SLP Individual Time: 8099-8338 SLP Individual Time Calculation (min): 41 min  Short Term Goals: Week 3: SLP Short Term Goal 1 (Week 3): Patient will communicate functional wants/needs/wishes/feelings through multimodal communication and sup A verbal/visual cues for effectiveness SLP Short Term Goal 2 (Week 3): Patient will approximate production of single syllable words with sup A verbal/visual cues to achieve 50% intelligibility SLP Short Term Goal 3 (Week 3): Patient will sustain vowel sound for up 3 seconds during 3 of 5 occasions with mod A cues SLP Short Term Goal 4 (Week 3): Patient will utilize low tech communcation supports Passenger transport manager) and/or written communication to convey message with mod I SLP Short Term Goal 5 (Week 3): Pt will consume dysphagia 2 textures with thin liquids with improved awareness of bolus/spillage, oral residuals, and without overt s/sx of aspiration with sup A to implement swallowing precautions and strategies  Skilled Therapeutic Interventions: Skilled ST treatment focused on speech goals and brief education with pt's daughter. Pt was consuming milkshake brought by daughter on arrival with good oral containment and no overt s/sx of aspiration. SLP facilitated education on pt's current speech deficits and their impact on pt's functional communication. Also discussed pt's oral dysphagia, current diet recommendations, and meal preparation at discharge. Pt's daughter verbalized understanding with teach back. Daughter had to leave for an appointment but plans to return on Friday for additional education. Will plan to provide dtr with handouts reinforcing education discussed today. Pt was able to produce up to 3-4 word utterances with sup A verbal cues for breath support techniques to achieve 50-75%  intelligibility. Pt with improved self monitoring skills and self correction and appeared more intentional about pausing between words to allow additional time to achieve adequate breath. Patient was left in bed with alarm activated and immediate needs within reach at end of session. Continue per current plan of care.      Pain  None/denied  Therapy/Group: Individual Therapy  Patty Sermons 08/22/2022, 4:15 PM

## 2022-08-22 NOTE — Progress Notes (Signed)
PROGRESS NOTE   Subjective/Complaints:  Pt states she moved LLE last noc , no new issues this am   ROS:  No SOB,cough,chest pain, no N/V. Limited d/t severe dysarthria   Objective:   No results found. Recent Labs    08/20/22 0616  WBC 7.6  HGB 10.5*  HCT 32.8*  PLT 365    Recent Labs    08/20/22 0616  NA 145  K 4.1  CL 109  CO2 27  GLUCOSE 91  BUN 13  CREATININE 0.96  CALCIUM 9.7     Intake/Output Summary (Last 24 hours) at 08/22/2022 0920 Last data filed at 08/22/2022 0905 Gross per 24 hour  Intake 649 ml  Output --  Net 649 ml         Physical Exam: Vital Signs Blood pressure 126/68, pulse 88, temperature (!) 97.5 F (36.4 C), resp. rate 15, weight 59.4 kg, SpO2 100 %.  General: No acute distress Mood and affect are appropriate Heart: Regular rate and rhythm no rubs murmurs or extra sounds Lungs: Clear to auscultation, breathing unlabored, no rales or wheezes Abdomen: Positive bowel sounds, soft nontender to palpation, nondistended Extremities: No clubbing, cyanosis, or edema Skin: No evidence of breakdown, no evidence of rash  Neurologic: Intact comprehension, nods head to most questions, Cranial nerves II through XII intact. LUE flaccid.   PE from prior encounter:  motor strength is 5/5 in right , 0/5 left deltoid, bicep, tricep, grip, hip flexor, knee extensors, ankle dorsiflexor and plantar flexor Trace Left hip/knee ext synergy  Sensory exam pt reports some sensation but impaired LUE>LLE Musculoskeletal: Full range of motion in all 4 extremities. No joint swelling or tenderness noted   Assessment/Plan: 1. Functional deficits which require 3+ hours per day of interdisciplinary therapy in a comprehensive inpatient rehab setting. Physiatrist is providing close team supervision and 24 hour management of active medical problems listed below. Physiatrist and rehab team continue to assess  barriers to discharge/monitor patient progress toward functional and medical goals  Care Tool:  Bathing    Body parts bathed by patient: Left arm, Chest, Abdomen, Face   Body parts bathed by helper: Right arm     Bathing assist Assist Level: Moderate Assistance - Patient 50 - 74%     Upper Body Dressing/Undressing Upper body dressing   What is the patient wearing?: Pull over shirt    Upper body assist Assist Level: Supervision/Verbal cueing    Lower Body Dressing/Undressing Lower body dressing      What is the patient wearing?: Pants     Lower body assist Assist for lower body dressing: Moderate Assistance - Patient 50 - 74%     Toileting Toileting    Toileting assist Assist for toileting: Maximal Assistance - Patient 25 - 49%     Transfers Chair/bed transfer  Transfers assist  Chair/bed transfer activity did not occur: Safety/medical concerns  Chair/bed transfer assist level: Moderate Assistance - Patient 50 - 74%     Locomotion Ambulation   Ambulation assist   Ambulation activity did not occur: Safety/medical concerns  Assist level: Maximal Assistance - Patient 25 - 49% Assistive device: Walker-hemi Max distance: 82 ft  Walk 10 feet activity   Assist  Walk 10 feet activity did not occur: Safety/medical concerns  Assist level: Maximal Assistance - Patient 25 - 49% Assistive device: Walker-hemi   Walk 50 feet activity   Assist Walk 50 feet with 2 turns activity did not occur: Safety/medical concerns         Walk 150 feet activity   Assist Walk 150 feet activity did not occur: Safety/medical concerns         Walk 10 feet on uneven surface  activity   Assist Walk 10 feet on uneven surfaces activity did not occur: Safety/medical concerns         Wheelchair     Assist Is the patient using a wheelchair?: Yes Type of Wheelchair: Manual    Wheelchair assist level: Dependent - Patient 0% Max wheelchair distance: 150     Wheelchair 50 feet with 2 turns activity    Assist        Assist Level: Dependent - Patient 0%   Wheelchair 150 feet activity     Assist      Assist Level: Dependent - Patient 0%   Blood pressure 126/68, pulse 88, temperature (!) 97.5 F (36.4 C), resp. rate 15, weight 59.4 kg, SpO2 100 %.  Medical Problem List and Plan: 1. 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  Antithrombotics: -DVT/anticoagulation:  Pharmaceutical: cont Lovenox , doppler without evidence of DVT              -antiplatelet therapy: continue Plavix and aspirin as PTA 3. Pain Management: tylenol as needed 4. Depression/insomnia: LCSW to evaluate and provide emotional support             -antipsychotic agents: n/a             -bupropion ER 150 mg q AM             -continue mirtazapine 30 mg q HS             -alprazolam 0.5 mg daily prn anxiety  -10/7 start melatonin PRN 5. Neuropsych/cognition: This patient is capable of making decisions on her own behalf. 6. Skin/Wound Care: routine skin care checks 7. Dysphagia: routine Is and Os and follow-up chemistries             continue Dysphagia 2 diet/thin-SLP    9: Hypertension: continue to monitor;  avoid hypotension             -continue Cardizem 180 mg daily   Vitals:   08/21/22 1923 08/22/22 0404  BP: 129/79 126/68  Pulse: (!) 102 88  Resp: 17 15  Temp: 97.6 F (36.4 C) (!) 97.5 F (36.4 C)  SpO2: 100% 100%   -10/9 overall well controlled, continue to monitor 10: Hyperlipidemia: LDL = 125, counsel regarding healthy diet             -continue  ezetimibe 10 mg daily             -continue atorvastatin 90 mg daily 11: Tobacco use:  cessation  counseling             -continue nicotine patch 21 mg 12. Constipation           -LBM 10/8 medium , hard - increase Senna S to BID , cont BID miralax 13. GERD             -continue PPI 14.  Dysarthria improving continue SLP  -On dys 2 diet with thin liquids 15. Sneezing and coughing: respiratory panel negative- appears better today , ? Allergy  16. Insomnia     LOS: 21 days A FACE TO FACE EVALUATION WAS PERFORMED  Charlett Blake 08/22/2022, 9:20 AM

## 2022-08-22 NOTE — Patient Care Conference (Signed)
Inpatient RehabilitationTeam Conference and Plan of Care Update Date: 08/22/2022   Time: 10:18 AM    Patient Name: Kara Bailey      Medical Record Number: [id number]  Date of Birth: [birth year] Sex: Female         Room/Bed: 4W11C/4W11C-01 Payor Info: Payor: [employer] MEDICARE / Plan: [employer] / Product Type: *No Product type* /    Admit Date/Time:  08/01/2022  6:21 PM  Primary Diagnosis:  Ischemic stroke [facility])  Hospital Problems: Principal Problem:   Ischemic stroke [facility])    Expected Discharge Date: Expected Discharge Date: 08/28/22  Team Members Present: Physician leading conference: Dr. Alysia Penna Social Worker Present: Erlene Quan, BSW Nurse Present: Dorien Chihuahua, RN PT Present: Barrie Folk, PT OT Present: Meriel Pica, OT SLP Present: Sherren Kerns, SLP PPS Coordinator present : Gunnar Fusi, SLP     Current Status/Progress Goal Weekly Team Focus  Bowel/Bladder   Continent of b/b. lbm 10/8  Remain continent of b/b  Assist with toileting prn and qshift   Swallow/Nutrition/ Hydration   dysphagia 2 diet, thin liquids sup A  sup A  tolerance of current diet with implementation of strategies, PO trials as tolerated   ADL's   UB bathing and dressing supervision using hemi strategies, LB bathing min A, LB dressing mod-max A, sit to stands from low toilet and wc CGA.  static stand with R hand on support CGA to close S.  unable to hold balance without R hand support for >5 seconds. Squat pivot transfers min A. No longer needs a stedy. hypertone in LUE with no active return.  minimal assist (LTG of LB dressing and toileting will likely need to be downgraded to mod A)  ADL retraining, LUE NMR and estim, postural control, balance, functional mobility, pt and fam education   Mobility   min assist bed mobility to the R side of the bed. squat pivot transfer with min-mod assist.  Min/ ModA for mobiltiy/ transfers; MaxA for ambulation  family edcuation.  trasnfer training. gait training. forced use of the LLE in stance and gait. WC mobility. custom WC evalution.   Communication   mod A expressive, mod I-to-sup A multimodal communication  mod I multimodal communication; min-to-mod A for speech strategies at the word level  speech intelligibility strategies at the word and phrase level, breath support   Safety/Cognition/ Behavioral Observations            Pain   no c/o pain  Remain pain free  Assess qshift and prn   Skin   Skin intact  Remain intact  Assess qshift and prn     Discharge Planning:  Discharging home witth daughter and son will assist daughter with care PRN.   Team Discussion: Patient with spasms in leg, moving more with spasticity in left UE. Continue to note w/c bound at home. Progress limited by tone; burden on caregivers is lessened however little progress noted overall.  Patient on target to meet rehab goals: Currently able to activate left hip extension and completes upper body care using hemi techniques. Needs mod assist for expression. Note initiation, writing and verbalizing more; goals for mod I using multi modal communication tools.   *See Care Plan and progress notes for long and short-term goals.   Revisions to Treatment Plan:  Downgraded OT goals to mod assist AFO consult W/C eval for ultra light W/C   Teaching Needs: Safety, transfers, toileting, medications, secondary risk management, etc.  Current Barriers to Discharge: Decreased  caregiver support and Home enviroment access/layout  Possible Resolutions to Barriers: Family education DME: hospital bed, stedy lift, DA- BSC, TTB, RW, W/C HH follow up services     Medical Summary Current Status: occ Left shoulder pain,AFO eval completed , WC eval pnd  Barriers to Discharge: Medical stability   Possible Resolutions to [employer] Focus: Manage new equipment with additional pt and family ed   Continued Need for Acute Rehabilitation Level of Care:  The patient requires daily medical management by a physician with specialized training in physical medicine and rehabilitation for the following reasons: Direction of a multidisciplinary physical rehabilitation program to maximize functional independence : Yes Medical management of patient stability for increased activity during participation in an intensive rehabilitation regime.: Yes Analysis of laboratory values and/or radiology reports with any subsequent need for medication adjustment and/or medical intervention. : Yes   I attest that I was present, lead the team conference, and concur with the assessment and plan of the team.   Dorien Chihuahua B 08/22/2022, 2:23 PM

## 2022-08-22 NOTE — Progress Notes (Signed)
Occupational Therapy Session Note  Patient Details  Name: Kara Bailey MRN: [id number] Date of Birth: 10-03-1956  Today's Date: 08/22/2022 OT Individual Time: 1035-1130 OT Individual Time Calculation (min): 55 min    Short Term Goals: Week 3:  OT Short Term Goal 1 (Week 3): Pt will return demo self-ROM techniques with independence on 2 sessions to demo recall and understanding of LUE NMR. OT Short Term Goal 2 (Week 3): Pt will complete toileting tasks with moderate assistance x 1.  Skilled Therapeutic Interventions/Progress Updates:    Pt received in w/c ready for therapy. Pt taken to gym to focus on LUE motor activation.  Pt worked on squat pivot to mat to R with min A with cues for forward lean and gaze over L foot.  Pt able to complete transfer with min A.  Pt initially had high tone in LUE with biceps flexed at 90 degrees. Had pt do body on arm ROM by rotating her trunk away reaching R arm behind her as L arm stabilized on mat and then pt held L wrist in R hand and worked on rotation of trunk for a 1-2 minutes. This helped relax her arm to allow elbow to be fully stretched out.   She continues to sit with trunk flexion so needs frequent cues to lift chest and use her back muscles for upright posture to allow for better alignment of shoulder. Applied estim using Empi estim unit to forearm and triceps at small muscle atrophy setting. Pt tolerated intensities of 18-20 for 20 minutes while integrating arm into functional reach patterns. Used the UE reacher as a mobile arm support and integrated RUE in for B reaching and pulling.  Had pt "hold"  theraband in L hand (wrapped around wrist) and other side in R hand for visual feedback to think of using B hands together.  After estim, no active movement in arm except for trace with scapular retraction.  Worked on squat pivot transfers back to her L side with pt completing a very smooth controlled transfer with min A.  Pt returned to her room, resting  in wc with all needs met.   Therapy Documentation Precautions:  Precautions Precautions: Fall Precaution Comments: L hemi. expressive aphasia Restrictions Weight Bearing Restrictions: No   Pain: no c/o pain         Therapy/Group: Individual Therapy  Antwoine Zorn 08/22/2022, 8:26 AM

## 2022-08-23 NOTE — Progress Notes (Signed)
Occupational Therapy Session Note  Patient Details  Name: Kara Bailey MRN: [id number] Date of Birth: 1956/11/03  Today's Date: 08/23/2022 OT Individual Time: 5374-8270 OT Individual Time Calculation (min): 30 min    Short Term Goals: Week 3:  OT Short Term Goal 1 (Week 3): Pt will return demo self-ROM techniques with independence on 2 sessions to demo recall and understanding of LUE NMR. OT Short Term Goal 2 (Week 3): Pt will complete toileting tasks with moderate assistance x 1.  Skilled Therapeutic Interventions/Progress Updates:    Pt received in w.c ready for therapy. Pt's family was scheduled to come in for education today but were not there. Pt stated they are coming tomorrow.   Pt taken to gym to practice and reinforce skills of squat pivot transfers, sit to stand from mat using w/c arm rest as a balance and aid and self ROM of arms with trunk rotation to reduce tone.  At start of session, after first transfers pt had increased tone of biceps and unable to fully extend arm  but after 2 minutes of self ROM her tone relaxed to be able to fully extend arm.    She did well recalling cues for safe sit pivot but needed more cues with sit to stand technique to fully engage her glutes for an upright stand. Practiced this 8x and the last 3x she did excellent needing only light guarding assist.   Pt then transferred back to w/c with sw pivot min A.  Returned to room with all needs met.   Therapy Documentation Precautions:  Precautions Precautions: Fall Precaution Comments: L hemi. expressive aphasia Restrictions Weight Bearing Restrictions: No    Vital Signs: Therapy Vitals Temp: (Abnormal) 97.5 F (36.4 C) Temp Source: Oral Pulse Rate: 90 Resp: 15 BP: 123/72 Patient Position (if appropriate): Sitting Oxygen Therapy SpO2: 100 % O2 Device: Room Air Pain: no c/o pain        Therapy/Group: Individual Therapy  Jadia Capers 08/23/2022, 3:47 PM

## 2022-08-23 NOTE — Progress Notes (Signed)
PROGRESS NOTE   Subjective/Complaints:  Dysarthria/breath support improving but still mod-sev  ROS:  No SOB,cough,chest pain, no N/V. Limited d/t severe dysarthria   Objective:   No results found. No results for input(s): "WBC", "HGB", "HCT", "PLT" in the last 72 hours.  No results for input(s): "NA", "K", "CL", "CO2", "GLUCOSE", "BUN", "CREATININE", "CALCIUM" in the last 72 hours.   Intake/Output Summary (Last 24 hours) at 08/23/2022 0835 Last data filed at 08/22/2022 1853 Gross per 24 hour  Intake 354 ml  Output --  Net 354 ml         Physical Exam: Vital Signs Blood pressure 128/68, pulse 84, temperature 98 F (36.7 C), temperature source Oral, resp. rate 17, weight 59.4 kg, SpO2 100 %.  General: No acute distress Mood and affect are appropriate Heart: Regular rate and rhythm no rubs murmurs or extra sounds Lungs: Clear to auscultation, breathing unlabored, no rales or wheezes Abdomen: Positive bowel sounds, soft nontender to palpation, nondistended Extremities: No clubbing, cyanosis, or edema Skin: No evidence of breakdown, no evidence of rash  Neurologic: Intact comprehension, nods head to most questions, Cranial nerves II through XII intact. LUE flaccid.   PE from prior encounter:  motor strength is 5/5 in right , 0/5 left deltoid, bicep, tricep, grip, hip flexor, knee extensors, ankle dorsiflexor and plantar flexor Trace Left hip/knee ext synergy  Sensory exam pt reports some sensation but impaired LUE>LLE Musculoskeletal: Full range of motion in all 4 extremities. No joint swelling or tenderness noted   Assessment/Plan: 1. Functional deficits which require 3+ hours per day of interdisciplinary therapy in a comprehensive inpatient rehab setting. Physiatrist is providing close team supervision and 24 hour management of active medical problems listed below. Physiatrist and rehab team continue to assess  barriers to discharge/monitor patient progress toward functional and medical goals  Care Tool:  Bathing    Body parts bathed by patient: Left arm, Chest, Abdomen, Face   Body parts bathed by helper: Right arm     Bathing assist Assist Level: Moderate Assistance - Patient 50 - 74%     Upper Body Dressing/Undressing Upper body dressing   What is the patient wearing?: Pull over shirt    Upper body assist Assist Level: Supervision/Verbal cueing    Lower Body Dressing/Undressing Lower body dressing      What is the patient wearing?: Pants     Lower body assist Assist for lower body dressing: Moderate Assistance - Patient 50 - 74%     Toileting Toileting    Toileting assist Assist for toileting: Maximal Assistance - Patient 25 - 49%     Transfers Chair/bed transfer  Transfers assist  Chair/bed transfer activity did not occur: Safety/medical concerns  Chair/bed transfer assist level: Moderate Assistance - Patient 50 - 74%     Locomotion Ambulation   Ambulation assist   Ambulation activity did not occur: Safety/medical concerns  Assist level: Maximal Assistance - Patient 25 - 49% Assistive device: Walker-hemi Max distance: 65 ft   Walk 10 feet activity   Assist  Walk 10 feet activity did not occur: Safety/medical concerns  Assist level: Maximal Assistance - Patient 25 - 49% Assistive device: Walker-hemi  Walk 50 feet activity   Assist Walk 50 feet with 2 turns activity did not occur: Safety/medical concerns         Walk 150 feet activity   Assist Walk 150 feet activity did not occur: Safety/medical concerns         Walk 10 feet on uneven surface  activity   Assist Walk 10 feet on uneven surfaces activity did not occur: Safety/medical concerns         Wheelchair     Assist Is the patient using a wheelchair?: Yes Type of Wheelchair: Manual    Wheelchair assist level: Dependent - Patient 0% Max wheelchair distance: 150     Wheelchair 50 feet with 2 turns activity    Assist        Assist Level: Dependent - Patient 0%   Wheelchair 150 feet activity     Assist      Assist Level: Dependent - Patient 0%   Blood pressure 128/68, pulse 84, temperature 98 F (36.7 C), temperature source Oral, resp. rate 17, weight 59.4 kg, SpO2 100 %.  Medical Problem List and Plan: 1. 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  Antithrombotics: -DVT/anticoagulation:  Pharmaceutical: cont Lovenox , doppler without evidence of DVT              -antiplatelet therapy: continue Plavix and aspirin as PTA 3. Pain Management: tylenol as needed 4. Depression/insomnia: LCSW to evaluate and provide emotional support             -antipsychotic agents: n/a             -bupropion ER 150 mg q AM             -continue mirtazapine 30 mg q HS             -alprazolam 0.5 mg daily prn anxiety  -10/7 start melatonin PRN 5. Neuropsych/cognition: This patient is capable of making decisions on her own behalf. 6. Skin/Wound Care: routine skin care checks 7. Dysphagia: routine Is and Os and follow-up chemistries             continue Dysphagia 2 diet/thin-SLP Need to enc fluids    9: Hypertension: continue to monitor;  avoid hypotension             -continue Cardizem 180 mg daily   Vitals:   08/22/22 1912 08/23/22 0355  BP: (!) 141/93 128/68  Pulse: 96 84  Resp: 18 17  Temp: 98.3 F (36.8 C) 98 F (36.7 C)  SpO2: 100% 100%   -10/12 overall well controlled, continue to monitor 10: Hyperlipidemia: LDL = 125, counsel regarding healthy diet             -continue  ezetimibe 10 mg daily             -continue atorvastatin 90 mg  daily 11: Tobacco use: cessation counseling             -continue nicotine patch 21 mg 12. Constipation           -  LBM 10/12 medium , type 2 - increase Senna S to BID , cont BID miralax 13. GERD             -continue PPI 14.  Dysarthria improving continue SLP  -On dys 2 diet with thin liquids 15. Sneezing and coughing: respiratory panel negative- appears better today , ? Allergy  16. Insomnia     LOS: 22 days A FACE TO FACE EVALUATION WAS PERFORMED  Charlett Blake 08/23/2022, 8:35 AM

## 2022-08-23 NOTE — Progress Notes (Addendum)
Speech Language Pathology Daily Session Note  Patient Details  Name: Kara Bailey MRN: [id number] Date of Birth: 13-May-1956  Today's Date: 08/23/2022 SLP Individual Time: 9326-7124 SLP Individual Time Calculation (min): 40 min  Short Term Goals: Week 3: SLP Short Term Goal 1 (Week 3): Patient will communicate functional wants/needs/wishes/feelings through multimodal communication and sup A verbal/visual cues for effectiveness SLP Short Term Goal 2 (Week 3): Patient will approximate production of single syllable words with sup A verbal/visual cues to achieve 50% intelligibility SLP Short Term Goal 3 (Week 3): Patient will sustain vowel sound for up 3 seconds during 3 of 5 occasions with mod A cues SLP Short Term Goal 4 (Week 3): Patient will utilize low tech communcation supports Passenger transport manager) and/or written communication to convey message with mod I SLP Short Term Goal 5 (Week 3): Pt will consume dysphagia 2 textures with thin liquids with improved awareness of bolus/spillage, oral residuals, and without overt s/sx of aspiration with sup A to implement swallowing precautions and strategies  Skilled Therapeutic Interventions: Skilled ST treatment focused on speech goals. Pt's family was not present for scheduled education session. Pt stated daughter plans to come tomorrow. SLP facilitated session by providing mod fading to min A for production of one, two, and three syllable words to achieve 50-75% intelligibility. SLP facilitated production of family member and pet names to achieve 75% intelligibility with min A for breath support, separating syllables, and increasing vocal intensity. Pt combined names with short-phrase length production including "Lenna Sciara is my daughter" to achieve ~50% intelligibility with min A for implementation of speech strategies. Pt demonstrated excellent carry over throughout session and exhibited increasing self monitoring and self correction at the mod I-to-sup  A level. Pt continues to benefit from intermittently taking drinks of water throughout which improved her vocal clarity. Pt is no longer reliant on written communication as primary source of communication and implemented written support for clarification x3 throughout session. Patient was left in wheelchair with alarm activated and immediate needs within reach at end of session. Continue per current plan of care.      Pain  None/denied  Therapy/Group: Individual Therapy  Patty Sermons 08/23/2022, 5:02 PM

## 2022-08-24 MED ORDER — DILTIAZEM HCL ER COATED BEADS 240 MG PO CP24
240.0000 mg | ORAL_CAPSULE | Freq: Every day | ORAL | Status: DC
  Administered 2022-08-25 – 2022-08-31 (×7): 240 mg via ORAL
  Filled 2022-08-24 (×9): qty 1

## 2022-08-24 MED ORDER — TIZANIDINE HCL 4 MG PO TABS
2.0000 mg | ORAL_TABLET | Freq: Every day | ORAL | Status: DC
  Administered 2022-08-24 – 2022-09-06 (×14): 2 mg via ORAL
  Filled 2022-08-24 (×16): qty 1

## 2022-08-24 NOTE — Progress Notes (Signed)
Occupational Therapy Weekly Progress Note  Patient Details  Name: Kara Bailey MRN: [id number] Date of Birth: 1956/08/26  Beginning of progress report period: August 17, 2022 End of progress report period: August 24, 2022  Today's Date: 08/24/2022 OT Individual Time: 1115-1200 OT Individual Time Calculation (min): 45 min    Patient has met 2 of 2 short term goals.  Pt is doing well with RUE self ROM and is improving her transfer skills.   Patient continues to demonstrate the following deficits: abnormal tone and unbalanced muscle activation and decreased sitting balance, decreased standing balance, decreased postural control, hemiplegia, and decreased balance strategies and therefore will continue to benefit from skilled OT intervention to enhance overall performance with BADL.  Patient progressing toward long term goals..  Plan of care revisions: :.  Problem: RH Balance Goal: LTG Patient will maintain dynamic standing with ADLs (OT) Description: LTG:  Patient will maintain dynamic standing balance with assist during activities of daily living (OT)  Flowsheets (Taken 08/24/2022 1319) LTG: Pt will maintain dynamic standing balance during ADLs with: (LTG modified. Pt will static stand with min A to CGA but with RUE support.) Minimal Assistance - Patient > 75% Note: LTG modified. Pt will static stand with min A to CGA but with RUE support.   Problem: RH Dressing Goal: LTG Patient will perform lower body dressing w/assist (OT) Description: LTG: Patient will perform lower body dressing with assist, with/without cues in positioning using equipment (OT) Flowsheets (Taken 08/24/2022 1319) LTG: Pt will perform lower body dressing with assistance level of: (LTG downgraded.) Moderate Assistance - Patient 50 - 74% Note: LTG downgraded.   Problem: RH Toileting Goal: LTG Patient will perform toileting task (3/3 steps) with assistance level (OT) Description: LTG: Patient will perform  toileting task (3/3 steps) with assistance level (OT)  Flowsheets (Taken 08/24/2022 1319) LTG: Pt will perform toileting task (3/3 steps) with assistance level: (LTG downgraded due to limited standing balance.) Moderate Assistance - Patient 50 - 74% Note: LTG downgraded due to limited standing balance.   Problem: RH Functional Use of Upper Extremity Goal: LTG Patient will use RT/LT upper extremity as a (OT) Description: LTG: Patient will use right/left upper extremity as a stabilizer/gross assist/diminished/nondominant/dominant level with assist, with/without cues during functional activity (OT) Flowsheets (Taken 08/24/2022 1319) LTG: Use of upper extremity in functional activities: (LTG discontinued as pt is not able to use arm as a stabilizer due to flexor tone.) -- Note: LTG discontinued as pt is not able to use arm as a stabilizer due to flexor tone.     OT Short Term Goals Week 1:  OT Short Term Goal 1 (Week 1): Pt will be able to complete a sq pivot toilet transfer with max A of 1. OT Short Term Goal 1 - Progress (Week 1): Met OT Short Term Goal 2 (Week 1): Pt will be able to rise to stand and hold grab bar with R hand with mod A of 1. OT Short Term Goal 2 - Progress (Week 1): Met OT Short Term Goal 3 (Week 1): Pt will hold static stand with RUE support during LB self care. OT Short Term Goal 3 - Progress (Week 1): Not met OT Short Term Goal 4 (Week 1): Pt will don a pullover shirt with min A OT Short Term Goal 4 - Progress (Week 1): Not met OT Short Term Goal 5 (Week 1): Pt will demonstrate self management of L arm with positioning with min cues. OT Short Term Goal  5 - Progress (Week 1): Met Week 2:  OT Short Term Goal 1 (Week 2): Pt will complete SPT to toilet with moderate assist x 1 with use of LRAD as needed. OT Short Term Goal 1 - Progress (Week 2): Met OT Short Term Goal 2 (Week 2): Pt will don shirt with minimal assistance with moderate prompting for use of  hemi-techniques. OT Short Term Goal 2 - Progress (Week 2): Met OT Short Term Goal 3 (Week 2): Pt will perform LB dressing with moderate assistance x 1 using hemi-dressing techniques as well. OT Short Term Goal 3 - Progress (Week 2): Met OT Short Term Goal 4 (Week 2): Pt will complete toileting tasks with moderate assistance x 1. OT Short Term Goal 4 - Progress (Week 2): Partly met OT Short Term Goal 5 (Week 2): Pt will return demo self-ROM techniques with independence on 2 sessions to demo recall and understanding of LUE NMR. OT Short Term Goal 5 - Progress (Week 2): Progressing toward goal Week 3:  OT Short Term Goal 1 (Week 3): Pt will return demo self-ROM techniques with independence on 2 sessions to demo recall and understanding of LUE NMR. OT Short Term Goal 1 - Progress (Week 3): Met OT Short Term Goal 2 (Week 3): Pt will complete toileting tasks with moderate assistance x 1. OT Short Term Goal 2 - Progress (Week 3): Met Week 4:  OT Short Term Goal 1 (Week 4): STGs = LTGs  Skilled Therapeutic Interventions/Progress Updates:    Pt received in wc and ready for therapy. Pt taken to main gym to focus on squat pivot transfers to practice prior to her family ed session this afternoon. Pt followed cues well and was able to transfer with min A.  Sat on mat and pt did self ROM of LUE with rotation of trunk to reduce tone in elbow. Tone did reduce to allow her arm to fully stretch.  Used estim on forearm to activate finger and wrist extension.  Tolerated 19 intensity with Empi estim small muscle atrophy setting. Integrated reaching activity with estim.  Pt taken back to room to prepare for lunch. All needs met.   Therapy Documentation Precautions:  Precautions Precautions: Fall Precaution Comments: L hemi. expressive aphasia Restrictions Weight Bearing Restrictions: No  Pain: Pain Assessment Pain Scale: 0-10 Pain Score: 0-No pain ADL: ADL Eating: Set up Grooming: Setup (.) Where  Assessed-Grooming: Wheelchair, Sitting at sink Upper Body Bathing: Supervision/safety, Minimal cueing (using a long sponge to reach RUE) Where Assessed-Upper Body Bathing: Shower Lower Body Bathing: Minimal assistance (A to wash bottom) Where Assessed-Lower Body Bathing: Shower Upper Body Dressing: Minimal assistance Where Assessed-Upper Body Dressing: Wheelchair Lower Body Dressing:  (dressing from wc level, standing with R hand on bar for therapist to pull pants over hips) Where Assessed-Lower Body Dressing: Wheelchair Toileting: Moderate assistance (A with clothing management) Where Assessed-Toileting: Glass blower/designer: Minimal assistance (using bar for support, sq pivot) Toilet Transfer Method: Engineer, water: Energy manager: Environmental education officer Method: Education officer, environmental: Radio broadcast assistant, Grab bars ADL Comments: Pt able to recall hemi-techniques with grooming tasks learned in previous OT session.      Therapy/Group: Individual Therapy  [place] 08/24/2022, 1:26 PM

## 2022-08-24 NOTE — Plan of Care (Signed)
  Problem: RH Balance Goal: LTG Patient will maintain dynamic standing with ADLs (OT) Description: LTG:  Patient will maintain dynamic standing balance with assist during activities of daily living (OT)  Flowsheets (Taken 08/24/2022 1319) LTG: Pt will maintain dynamic standing balance during ADLs with: (LTG modified. Pt will static stand with min A to CGA but with RUE support.) Minimal Assistance - Patient > 75% Note: LTG modified. Pt will static stand with min A to CGA but with RUE support.   Problem: RH Dressing Goal: LTG Patient will perform lower body dressing w/assist (OT) Description: LTG: Patient will perform lower body dressing with assist, with/without cues in positioning using equipment (OT) Flowsheets (Taken 08/24/2022 1319) LTG: Pt will perform lower body dressing with assistance level of: (LTG downgraded.) Moderate Assistance - Patient 50 - 74% Note: LTG downgraded.   Problem: RH Toileting Goal: LTG Patient will perform toileting task (3/3 steps) with assistance level (OT) Description: LTG: Patient will perform toileting task (3/3 steps) with assistance level (OT)  Flowsheets (Taken 08/24/2022 1319) LTG: Pt will perform toileting task (3/3 steps) with assistance level: (LTG downgraded due to limited standing balance.) Moderate Assistance - Patient 50 - 74% Note: LTG downgraded due to limited standing balance.   Problem: RH Functional Use of Upper Extremity Goal: LTG Patient will use RT/LT upper extremity as a (OT) Description: LTG: Patient will use right/left upper extremity as a stabilizer/gross assist/diminished/nondominant/dominant level with assist, with/without cues during functional activity (OT) Flowsheets (Taken 08/24/2022 1319) LTG: Use of upper extremity in functional activities: (LTG discontinued as pt is not able to use arm as a stabilizer due to flexor tone.) -- Note: LTG discontinued as pt is not able to use arm as a stabilizer due to flexor tone.

## 2022-08-24 NOTE — Progress Notes (Signed)
Speech Language Pathology Weekly Progress and Session Note  Patient Details  Name: Kara Bailey MRN: [id number] Date of Birth: 06/19/1956  Beginning of progress report period: August 16, 2022 End of progress report period: August 24, 2022  Today's Date: 08/24/2022 SLP Individual Time: 8101-7510 SLP Individual Time Calculation (min): 45 min  Short Term Goals: Week 3: SLP Short Term Goal 1 (Week 3): Patient will communicate functional wants/needs/wishes/feelings through multimodal communication and sup A verbal/visual cues for effectiveness SLP Short Term Goal 1 - Progress (Week 3): Met SLP Short Term Goal 2 (Week 3): Patient will approximate production of single syllable words with sup A verbal/visual cues to achieve 50% intelligibility SLP Short Term Goal 2 - Progress (Week 3): Not met SLP Short Term Goal 3 (Week 3): Patient will sustain vowel sound for up 3 seconds during 3 of 5 occasions with mod A cues SLP Short Term Goal 3 - Progress (Week 3): Not met SLP Short Term Goal 4 (Week 3): Patient will utilize low tech communcation supports Passenger transport manager) and/or written communication to convey message with mod I SLP Short Term Goal 4 - Progress (Week 3): Met SLP Short Term Goal 5 (Week 3): Pt will consume dysphagia 2 textures with thin liquids with improved awareness of bolus/spillage, oral residuals, and without overt s/sx of aspiration with sup A to implement swallowing precautions and strategies SLP Short Term Goal 5 - Progress (Week 3): Met  New Short Term Goals: Week 4: SLP Short Term Goal 1 (Week 4): STG=LTG due to ELOS  Weekly Progress Updates: Pt has demonstrated functional gains, as evident by meeting 3 out of 6 short-term goals this reporting period. Pt continues to progress with speech goals by communicating functional needs via multimodal means with mod I with improved initiation; min A verbal cues for implementation of speech and breath support strategies at the word  and short sentence level. Pt has been demonstrating improved carry over session-to-session. Pt continues to consume dysphagia 2 textures and thin liquids with sup A for implementation of swallowing compensatory strategies. Pt and family education ongoing re: diet recommendations, supportive communication interventions, and ST POC. Continue to recommend ST intervention during CIR admission, as well as ST f/u upon d/c. Recommend 24/7 supervision and assistance at time of discharge.     Intensity: Minumum of 1-2 x/day, 30 to 90 minutes Frequency: 3 to 5 out of 7 days Duration/Length of Stay: 10/17 Treatment/Interventions: Dysphagia/aspiration precaution training;Speech/Language facilitation;Oral motor exercises;Therapeutic Activities;Functional tasks;Patient/family education;Multimodal communication approach  Daily Session Skilled Therapeutic Interventions: Skilled ST treatment focused on speech goals. Pt greeted in bed on arrival and requesting to transfer to wheelchair. SLP facilitated lower and upper body dressing at bed level with overall mod A to thread arms and legs. Donned shoes with total A. Pt transferred to w/c with squat pivot and min-to-mod A. SLP facilitated session by providing min A fading to sup A verbal cues for production of one, two, and three syllable words with emphasis on breath support for speech, separating syllables, and over exaggeration of articulators. Pt with excellent carry over of strategies at the word level. Increased difficulty obtaining adequate breath at the phrase level and during non-structured speech tasks. Pt utilized non-verbal communication and written supports to supplement verbal communication with mod I. Patient was left in wheelchair with alarm activated and immediate needs within reach at end of session. Continue per current plan of care.      General    Pain Pain Assessment Pain Scale: 0-10  Pain Score: 0-No pain  Therapy/Group: Individual  Therapy  Teylor Wolven T Wrenley Sayed 08/24/2022, 1:31 PM

## 2022-08-24 NOTE — Progress Notes (Signed)
Physical Therapy Session Note  Patient Details  Name: Kara Bailey MRN: [id number] Date of Birth: 04-14-1956  Today's Date: 08/24/2022 PT Individual Time:  -      Short Term Goals: Week 1:  PT Short Term Goal 1 (Week 1): Pt will perform bed mobility with max assist of 1 consistently PT Short Term Goal 1 - Progress (Week 1): Met PT Short Term Goal 2 (Week 1): Pt will sit EOB with CGA >3 mintues PT Short Term Goal 2 - Progress (Week 1): Met PT Short Term Goal 3 (Week 1): Pt will propell WC >100with min assist PT Short Term Goal 3 - Progress (Week 1): Met PT Short Term Goal 4 (Week 1): Pt will tolerate standing >1 min with max assist from PT PT Short Term Goal 4 - Progress (Week 1): Met Week 2:  PT Short Term Goal 1 (Week 2): pt will perform bed mobiltiy with mod assist PT Short Term Goal 1 - Progress (Week 2): Met PT Short Term Goal 2 (Week 2): Pt will transfer to [facility] with mod assist PT Short Term Goal 2 - Progress (Week 2): Met PT Short Term Goal 3 (Week 2): Pt will propell WC 188f with min assist PT Short Term Goal 3 - Progress (Week 2): Met PT Short Term Goal 4 (Week 2): Pt will perform sit<>stand with mod assist consistently PT Short Term Goal 4 - Progress (Week 2): Met Week 3:  PT Short Term Goal 1 (Week 3): [facility] PT Short Term Goal 2 (Week 3): Pt wil ambulate 579fwith max A +2. PT Short Term Goal 3 (Week 3): Pt will propell WC 15057fith CGA PT Short Term Goal 4 (Week 3): Pt will perform 5xSTS to assess fall risk  Skilled Therapeutic Interventions/Progress Updates:   Session 1.  Pt received supine in bed and agreeable to PT.  Rolling R and L to don pants with min assist and cues for use of bed rails. Total A for clothing management. Supine>sit transfer with mod assist and cues for sequencing with roll to the L side. Squat pivot [facility] and cues for RUE positioning. APT present for WC evaluation. WC propulsion  through hall with min assist on this day to prevent veer to the L and cues for use of momentum to reduce enerrgy expenditure x [id number] pivot transfer to bed with min assist to the L. Sitting balance EOB x 15 min with supervision  assist for sitting assessment and consultation. Supervision assist for balance and for posterior/lateral scooting in bed. Recommendation for ultralightweight K005 WC with angle adjusteble back. Contour  cushion, arm trough and hemi height position.   Sit<>stand x 8 with mod assist to block the LLE into standing and max assist for standing balance to engaged LLE extension 5x 30-45 sec. Max cues for posture and weight shift to the R.  Gait training with HW x 30ft55fh max assist from PT with only trace activation on the LLE into extension on this day and max cues for posture and weight shift to the R.   Additional WC mobility with supervision assist x 100ft 16f to room with max cues for sequencing and coordination of the RUE/RLE to maintain stragiht path. Patient returned to room and left sitting [facility] in reach and all needs met.    Session 2.  Pt received sitting in WC and agreeable to PT.  Pt transported to entrance of [place].  Seated NMR: hip.knee flexion/extension 3 x 12 BLE with trace activation into extension noted on the LLE. Hip adduction/abduction 2 x 10 with AROM on the RLE and AAROM on the LLE>   WC mobility over cement sidewalk x 160f and through hospital atrium x 1049f Pt reports soreness and pain in the R calf following second bout of WC mobility. .   Sit<>stand x 10 with mod assist and UE support on the [facility] CenterStanding tolerance 5 x 40sec with min-mod assist and noted improvement in weight shift to the R this PM. Pregait stepping with the RLE to force WB through LLE 2 x 5 .   Patient returned to room and left [facility] in reach and all needs met.        Therapy Documentation Precautions:  Precautions Precautions:  Fall Precaution Comments: L hemi. expressive aphasia Restrictions Weight Bearing Restrictions: No    Vital Signs: Therapy Vitals Temp: 98.1 F (36.7 C) Temp Source: Oral Pulse Rate: 95 Resp: 16 BP: (!) 152/80 Patient Position (if appropriate): Lying Oxygen Therapy SpO2: 100 % O2 Device: Room Air Pain:  Denies in session 1 and 2    Therapy/Group: Individual Therapy  AuLorie Phenix0/13/2023, 5:26 AM

## 2022-08-24 NOTE — Progress Notes (Signed)
Physical Therapy Session Note  Patient Details  Name: Kara Bailey MRN: [id number] Date of Birth: 12/09/1955  Today's Date: 08/24/2022 PT Individual Time: 1401-1503 PT Individual Time Calculation (min): 62 min   Short Term Goals: Week 2:  PT Short Term Goal 1 (Week 2): pt will perform bed mobiltiy with mod assist PT Short Term Goal 1 - Progress (Week 2): Met PT Short Term Goal 2 (Week 2): Pt will transfer to WC with mod assist PT Short Term Goal 2 - Progress (Week 2): Met PT Short Term Goal 3 (Week 2): Pt will propell WC 150ft with min assist PT Short Term Goal 3 - Progress (Week 2): Met PT Short Term Goal 4 (Week 2): Pt will perform sit<>stand with mod assist consistently PT Short Term Goal 4 - Progress (Week 2): Met Week 3:  PT Short Term Goal 1 (Week 3): Pt will transfer to and from WC with min assist PT Short Term Goal 2 (Week 3): Pt wil ambulate 50ft with max A +2. PT Short Term Goal 3 (Week 3): Pt will propell WC 150ft with CGA PT Short Term Goal 4 (Week 3): Pt will perform 5xSTS to assess fall risk   Skilled Therapeutic Interventions/Progress Updates:  Patient seated upright in w/c on entrance to room. Patient alert and agreeable to PT session. Initially seeming a little down and then begins to cry pointing at the clock and using fingers to show "2" -- meaning 2 o'clock. Pt upset that family is supposed to be present for family education and has not arrived yet. She relates to this therapist via whiteboard that she "has no where to go" as she believes that family isn't here b/c they do not care to take her home to take care of her. Therapeutic listening and comforting reassurance provided that family may be tied up and running late. Also informed pt that family has related info that they are purchasing equipment to have at home in order to help mobilize her around the home. They wouldn't spend the money on equipment if she wasn't coming home. Pt continues to relate belief that they  have not shown up b/c they don't want her home. Pt's sister arrives during session and attempts to help console pt by attempting to call dtr and then son, finally reaching son who says that he is "on the way".   Patient with no pain complaint at start of session. Focus spent on squat pivot transfers and w/c mobility.   Therapeutic Activity: Transfers: Pt performed squat pivot transfers throughout session. Unable to produce lift from seat with forward lean and push to unweight. Pt able to perform with MinA and use of pull at footboard of bed. With hand placement on bed, pt requires Mod/ maxA to complete to bed in 2 attempts. Squat pivot to L side requires MaxA for lift, weight shift, and positioning.  Provided verbal cues for technique throughout.  Wheelchair Mobility:  Pt propelled wheelchair 70 feet using hemitechnique and CGA/ MinA for obstacles. Attempted to teach through error augmentation re: steering with use of RLE and coordinated propel with RUE as well as need to overcome direction front wheels are pointing. Supervision throughout with vc/ tc at RLE for effective heel strike and pull with foot plant to bring chair forward and maintain straight path.   Patient seated upright in w/c at end of session with brakes locked, belt alarm set, and all needs within reach. Sister in room and preparing to continue to console pt   re: where she will be going home.    Therapy Documentation Precautions:  Precautions Precautions: Fall Precaution Comments: L hemi. expressive aphasia Restrictions Weight Bearing Restrictions: No General:   Vital Signs: Therapy Vitals Temp: 97.7 F (36.5 C) Pulse Rate: 100 Resp: 15 BP: 135/84 Patient Position (if appropriate): Sitting Oxygen Therapy SpO2: 100 % O2 Device: Room Air Pain:  No pain complaint this session.   Therapy/Group: Individual Therapy  Alger Simons PT, DPT, CSRS 08/24/2022, 6:10 PM

## 2022-08-24 NOTE — Progress Notes (Signed)
Occupational Therapy Session Note  Patient Details  Name: Kara Bailey MRN: [id number] Date of Birth: 09/13/1956  Today's Date: 08/24/2022 OT Individual Time: 6691-6756 OT Individual Time Calculation (min): 44 min    Short Term Goals: Week 3:  OT Short Term Goal 1 (Week 3): Pt will return demo self-ROM techniques with independence on 2 sessions to demo recall and understanding of LUE NMR. OT Short Term Goal 1 - Progress (Week 3): Met OT Short Term Goal 2 (Week 3): Pt will complete toileting tasks with moderate assistance x 1. OT Short Term Goal 2 - Progress (Week 3): Met  Skilled Therapeutic Interventions/Progress Updates:  Pt greeted seated in w/c, daughter not present for family education session.     Pt reports wanting to work on transfers. Pt transported to gym with total A where pt completed blocked practice of squat pivot transfers from EOM<>w/c. Pt continues to need cues to shift weight anteriorly during transfer and did recall cue of looking at opposite foot during transfer however pt continues to require MOD A for squat pivot transfers.  Additionally worked on sit>stands from [facility] with pt pushing up from sitting surface and then transferring RUE to handle on w/c to simulate holding onto sink in her bathroom at home.pt completed sit>stands with MINA.  Daughter reports her daughter was coming a little before 2 pm therefore returned pt to room to check for daughter with daughter still not present. RN enter asking pt if she  needed to void bladder, pt declined but agreeable to try to sit on toilet to facilitate urine void. Pt completed stand pivot to toilet to R side with MAX A as pt needed assist to pivot LLE. Pt left seated on toilet with RN present.               Therapy Documentation Precautions:  Precautions Precautions: Fall Precaution Comments: L hemi. expressive aphasia Restrictions Weight Bearing Restrictions: No  Pain: no pain     Therapy/Group: Individual  Therapy  Precious Haws 08/24/2022, 3:55 PM

## 2022-08-24 NOTE — Progress Notes (Signed)
PROGRESS NOTE   Subjective/Complaints:  No issues overnite , discussed elevated BP and HR  ROS:  No SOB,cough,chest pain, no N/V. Limited d/t severe dysarthria   Objective:   No results found. No results for input(s): "WBC", "HGB", "HCT", "PLT" in the last 72 hours.  No results for input(s): "NA", "K", "CL", "CO2", "GLUCOSE", "BUN", "CREATININE", "CALCIUM" in the last 72 hours.   Intake/Output Summary (Last 24 hours) at 08/24/2022 0842 Last data filed at 08/23/2022 1322 Gross per 24 hour  Intake 118 ml  Output --  Net 118 ml         Physical Exam: Vital Signs Blood pressure (!) 152/80, pulse 95, temperature 98.1 F (36.7 C), temperature source Oral, resp. rate 16, weight 59.4 kg, SpO2 100 %.    General: No acute distress Mood and affect are appropriate Heart: Regular rate and rhythm no rubs murmurs or extra sounds Lungs: Clear to auscultation, breathing unlabored, no rales or wheezes Abdomen: Positive bowel sounds, soft nontender to palpation, nondistended Extremities: No clubbing, cyanosis, or edema Skin: No evidence of breakdown, no evidence of rash   Neurologic: Intact comprehension, nods head to most questions, Cranial nerves II through XII intact. LUE flaccid.   PE from prior encounter:  motor strength is 5/5 in right , 0/5 left deltoid, bicep, tricep, grip, hip flexor, knee extensors, ankle dorsiflexor and plantar flexor Trace Left hip/knee ext synergy  Sensory exam pt reports some sensation but impaired LUE>LLE Musculoskeletal: Full range of motion in all 4 extremities. No joint swelling or tenderness noted   Assessment/Plan: 1. Functional deficits which require 3+ hours per day of interdisciplinary therapy in a comprehensive inpatient rehab setting. Physiatrist is providing close team supervision and 24 hour management of active medical problems listed below. Physiatrist and rehab team continue to  assess barriers to discharge/monitor patient progress toward functional and medical goals  Care Tool:  Bathing    Body parts bathed by patient: Left arm, Chest, Abdomen, Face   Body parts bathed by helper: Right arm     Bathing assist Assist Level: Moderate Assistance - Patient 50 - 74%     Upper Body Dressing/Undressing Upper body dressing   What is the patient wearing?: Pull over shirt    Upper body assist Assist Level: Supervision/Verbal cueing    Lower Body Dressing/Undressing Lower body dressing      What is the patient wearing?: Pants     Lower body assist Assist for lower body dressing: Moderate Assistance - Patient 50 - 74%     Toileting Toileting    Toileting assist Assist for toileting: Maximal Assistance - Patient 25 - 49%     Transfers Chair/bed transfer  Transfers assist  Chair/bed transfer activity did not occur: Safety/medical concerns  Chair/bed transfer assist level: Moderate Assistance - Patient 50 - 74%     Locomotion Ambulation   Ambulation assist   Ambulation activity did not occur: Safety/medical concerns  Assist level: Maximal Assistance - Patient 25 - 49% Assistive device: Walker-hemi Max distance: 65 ft   Walk 10 feet activity   Assist  Walk 10 feet activity did not occur: Safety/medical concerns  Assist level: Maximal Assistance -  Patient 25 - 49% Assistive device: Walker-hemi   Walk 50 feet activity   Assist Walk 50 feet with 2 turns activity did not occur: Safety/medical concerns         Walk 150 feet activity   Assist Walk 150 feet activity did not occur: Safety/medical concerns         Walk 10 feet on uneven surface  activity   Assist Walk 10 feet on uneven surfaces activity did not occur: Safety/medical concerns         Wheelchair     Assist Is the patient using a wheelchair?: Yes Type of Wheelchair: Manual    Wheelchair assist level: Dependent - Patient 0% Max wheelchair distance:  150    Wheelchair 50 feet with 2 turns activity    Assist        Assist Level: Dependent - Patient 0%   Wheelchair 150 feet activity     Assist      Assist Level: Dependent - Patient 0%   Blood pressure (!) 152/80, pulse 95, temperature 98.1 F (36.7 C), temperature source Oral, resp. rate 16, weight 59.4 kg, SpO2 100 %.  Medical Problem List and Plan: 1. 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  Antithrombotics: -DVT/anticoagulation:  Pharmaceutical: cont Lovenox , doppler without evidence of DVT              -antiplatelet therapy: continue Plavix and aspirin as PTA 3. Pain Management: tylenol as needed 4. Depression/insomnia: LCSW to evaluate and provide emotional support             -antipsychotic agents: n/a             -bupropion ER 150 mg q AM             -continue mirtazapine 30 mg q HS             -alprazolam 0.5 mg daily prn anxiety  -10/7 start melatonin PRN 5. Neuropsych/cognition: This patient is capable of making decisions on her own behalf. 6. Skin/Wound Care: routine skin care checks 7. Dysphagia: routine Is and Os and follow-up chemistries             continue Dysphagia 2 diet/thin-SLP Need to enc fluids    9: Hypertension: continue to monitor;  avoid hypotension             -continue Cardizem 180 mg daily   Vitals:   08/23/22 2043 08/24/22 0410  BP: (!) 141/87 (!) 152/80  Pulse: 99 95  Resp: 14 16  Temp: (!) 97.5 F (36.4 C) 98.1 F (36.7 C)  SpO2: 99% 419%   -37/90 systolic elevation with mild tachy, increase diltiazem to '240mg'$  qd 10: Hyperlipidemia: LDL = 125, counsel regarding healthy diet             -continue  ezetimibe 10 mg daily              -continue atorvastatin 90 mg daily 11: Tobacco use: cessation counseling             -  continue nicotine patch 21 mg 12. Constipation           -LBM 10/12 medium , type 2 - increase Senna S to BID , cont BID miralax 13. GERD             -continue PPI 14.  Dysarthria improving continue SLP  -On dys 2 diet with thin liquids 15. Sneezing and coughing: respiratory panel negative- appears better today , ? Allergy  16. Insomnia     LOS: 23 days A FACE TO FACE EVALUATION WAS PERFORMED  Charlett Blake 08/24/2022, 8:42 AM

## 2022-08-24 NOTE — Progress Notes (Signed)
Speech Language Pathology Daily Session Note  Patient Details  Name: Kara Bailey MRN: [id number] Date of Birth: 1956/02/15  Today's Date: 08/24/2022 SLP Individual Time: 1505-1540 SLP Individual Time Calculation (min): 35 min  Short Term Goals: Week 4: SLP Short Term Goal 1 (Week 4): STG=LTG due to ELOS  Skilled Therapeutic Interventions: Skilled ST treatment focused on education with pt's sister. Daughter was not present for scheduled education session despite being scheduled on 10/11, 10/12, and 10/13. Pt's sister was present and stated she'll be minimally involved in pt's care, however would like to bring her meals from time to time therefore SLP provided extensive education regarding pt's dysphagia, and diet recommendations including dysphagia 2 textures and thin liquids. SLP provided handouts to reinforce topics discussed, including 2 guides on dysphagia 2 meal preparation with emphasis on how to prepare each food group to appropriate consistency, as well as foods/textures to avoid. Sister demonstrated incomplete understanding of education today. Further education with daughter and/or son is recommended prior to discharge. SLP left dysphagia 2 diet handouts in stroke education binder for other family members. SLP messaged treatment team to inform of status with education efforts. Requested for SW to reach back out to daughter regarding education and to reinforce recommendations for 24 hour supervision. SLP reinforced recommendations for 24 hour supervision including clinical reasoning with pt's sister. Sister stated "I don't know anything about that." Pt stated "I'll have it." Patient was left in with alarm activated and immediate needs within reach at end of session. Continue per current plan of care.      Pain  None/denied  Therapy/Group: Individual Therapy  Patty Sermons 08/24/2022, 3:36 PM

## 2022-08-25 NOTE — Progress Notes (Signed)
Physical Therapy Session Note  Patient Details  Name: Kara Bailey MRN: [id number] Date of Birth: December 09, 1955  Today's Date: 08/25/2022 PT Individual Time: 9741-6384 PT Individual Time Calculation (min): 70 min   Short Term Goals:  Week 3:  PT Short Term Goal 1 (Week 3): Pt will transfer to and from [facility] with min assist PT Short Term Goal 2 (Week 3): Pt wil ambulate 63f with max A +2. PT Short Term Goal 3 (Week 3): Pt will propell WC 1530fwith CGA PT Short Term Goal 4 (Week 3): Pt will perform 5xSTS to assess fall risk  Skilled Therapeutic Interventions/Progress Updates:   Pt received sitting in WC and agreeable to PT. With hemi technique and supervision assist from PT with moderate cues for turning technique and doorway management [id number] pivot transfer with mod assist to the L with cues for head/hips relationship. Sit<>supine with min assist for management for LLE only .   Supine NM for BLE:  SAQ AAROM on the LLE x 12 Hip/knee flexion/extension in synergy x 15 Hip clam shells x 15 with AAROM on the LLE.  Pelvis/lumbar rotation x 15.  Bridge 2 x 10 with tactile cues to activate L glutes and stabilize LLE.  Rolling R and L with assist into hooklying. X 5 bil   Gait training with HW and LAFO x [id number] max assist from PT to facilitate weight shift R and allow advancement of LLE> pt able to advance hte LLE 50% of steps without assist, but blocking into extension required to prevent L knee buckling. .   [facility] to [facility] and [facility] and all needs met.        Therapy Documentation Precautions:  Precautions Precautions: Fall Precaution Comments: L hemi. expressive aphasia Restrictions Weight Bearing Restrictions: No  Vital Signs: Therapy Vitals Temp: 97.7 F (36.5 C) Temp Source: Oral Pulse Rate: 98 Resp: 18 BP: 136/82 Patient Position (if appropriate): Sitting Oxygen Therapy SpO2: 100 % O2 Device: Room  Air Pain: denies    Therapy/Group: Individual Therapy  AustiLorie Phenix4/2023, 3:34 PM

## 2022-08-25 NOTE — Progress Notes (Signed)
Occupational Therapy Session Note  Patient Details  Name: Kara Bailey MRN: [id number] Date of Birth: [birth year]  Today's Date: 08/25/2022 OT Individual Time: 7290-2111 OT Individual Time Calculation (min): 60 min    Short Term Goals: Week 4:  OT Short Term Goal 1 (Week 4): STGs = LTGs  Skilled Therapeutic Interventions/Progress Updates:    Pt received EOB with nurse tech. She had just finished bathing and dressing pt.  Helped don her L AFO and shoe.  Pt completed squat pivot to wc to R with mod A.  Sat at sink to complete oral care with mod I.  Pt taken to gym to work on squat pivot transfers, sit to stands, static standing with and without R hand support on bar stool, and some dynamic standing with pt simulating pulling up her pants.  Overall pt is completing squat pivots at min to mod level, sit to stands from low mat with min A. Static stand with support close S, static stand without support min A, dynamic standing min -mod A with reaching toward knees and hips and at sh level.   Seated trunk rotation exercises to reduce RUE tone. Estim to forearm for finger and wrist extension with Empi estim small muscle atrophy setting at 19 intensity for 12 minutes. Worked with UE ranger for PROM of elbow extension and shoulder flexion with  moving arm across body and in circles. No active RUE motion.   Pt returned to wc and to room. In room with all needs met.    Therapy Documentation Precautions:  Precautions Precautions: Fall Precaution Comments: L hemi. expressive aphasia Restrictions Weight Bearing Restrictions: No   Pain: no c/o pain       Therapy/Group: Individual Therapy  Dixon 08/25/2022, 9:34 AM

## 2022-08-25 NOTE — Progress Notes (Signed)
PROGRESS NOTE   Subjective/Complaints:  No new complaints this morning She indicates to me to bring table closer to her so she can use her glasses Says "I'm good"  ROS:  Denies SOB,cough,chest pain, no N/V. Limited d/t severe dysarthria   Objective:   No results found. No results for input(s): "WBC", "HGB", "HCT", "PLT" in the last 72 hours.  No results for input(s): "NA", "K", "CL", "CO2", "GLUCOSE", "BUN", "CREATININE", "CALCIUM" in the last 72 hours.   Intake/Output Summary (Last 24 hours) at 08/25/2022 1507 Last data filed at 08/24/2022 1856 Gross per 24 hour  Intake 118 ml  Output --  Net 118 ml        Physical Exam: Vital Signs Blood pressure 127/71, pulse 99, temperature 98 F (36.7 C), temperature source Oral, resp. rate 18, weight 59.4 kg, SpO2 100 %. Gen: no distress, normal appearing HEENT: oral mucosa pink and moist, NCAT Cardio: Reg rate Chest: normal effort, normal rate of breathing Abd: soft, non-distended Ext: no edema Psych: pleasant, normal affect Skin: intact  Neurologic: Intact comprehension, nods head to most questions, Cranial nerves II through XII intact. LUE flaccid.   PE from prior encounter:  motor strength is 5/5 in right , 0/5 left deltoid, bicep, tricep, grip, hip flexor, knee extensors, ankle dorsiflexor and plantar flexor Trace Left hip/knee ext synergy  Sensory exam pt reports some sensation but impaired LUE>LLE Musculoskeletal: Full range of motion in all 4 extremities. No joint swelling or tenderness noted   Assessment/Plan: 1. Functional deficits which require 3+ hours per day of interdisciplinary therapy in a comprehensive inpatient rehab setting. Physiatrist is providing close team supervision and 24 hour management of active medical problems listed below. Physiatrist and rehab team continue to assess barriers to discharge/monitor patient progress toward functional and  medical goals  Care Tool:  Bathing    Body parts bathed by patient: Left arm, Chest, Abdomen, Face   Body parts bathed by helper: Right arm     Bathing assist Assist Level: Moderate Assistance - Patient 50 - 74%     Upper Body Dressing/Undressing Upper body dressing   What is the patient wearing?: Pull over shirt    Upper body assist Assist Level: Supervision/Verbal cueing    Lower Body Dressing/Undressing Lower body dressing      What is the patient wearing?: Pants     Lower body assist Assist for lower body dressing: Moderate Assistance - Patient 50 - 74%     Toileting Toileting    Toileting assist Assist for toileting: Maximal Assistance - Patient 25 - 49%     Transfers Chair/bed transfer  Transfers assist  Chair/bed transfer activity did not occur: Safety/medical concerns  Chair/bed transfer assist level: Moderate Assistance - Patient 50 - 74%     Locomotion Ambulation   Ambulation assist   Ambulation activity did not occur: Safety/medical concerns  Assist level: Maximal Assistance - Patient 25 - 49% Assistive device: Walker-hemi Max distance: 65 ft   Walk 10 feet activity   Assist  Walk 10 feet activity did not occur: Safety/medical concerns  Assist level: Maximal Assistance - Patient 25 - 49% Assistive device: Walker-hemi   Walk 50  feet activity   Assist Walk 50 feet with 2 turns activity did not occur: Safety/medical concerns         Walk 150 feet activity   Assist Walk 150 feet activity did not occur: Safety/medical concerns         Walk 10 feet on uneven surface  activity   Assist Walk 10 feet on uneven surfaces activity did not occur: Safety/medical concerns         Wheelchair     Assist Is the patient using a wheelchair?: Yes Type of Wheelchair: Manual    Wheelchair assist level: Dependent - Patient 0% Max wheelchair distance: 150    Wheelchair 50 feet with 2 turns activity    Assist         Assist Level: Dependent - Patient 0%   Wheelchair 150 feet activity     Assist      Assist Level: Dependent - Patient 0%   Blood pressure 127/71, pulse 99, temperature 98 F (36.7 C), temperature source Oral, resp. rate 18, weight 59.4 kg, SpO2 100 %.  Medical Problem List and Plan: 1. 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  Antithrombotics: -DVT/anticoagulation:  Pharmaceutical: cont Lovenox , doppler without evidence of DVT              -antiplatelet therapy: continue Plavix and aspirin as PTA 3. Pain Management: tylenol as needed 4. Depression/insomnia: LCSW to evaluate and provide emotional support             -antipsychotic agents: n/a             -bupropion ER 150 mg q AM             -continue mirtazapine 30 mg q HS             -continue alprazolam 0.5 mg daily prn anxiety  -10/7 start melatonin PRN 5. Neuropsych/cognition: This patient is capable of making decisions on her own behalf. 6. Skin/Wound Care: routine skin care checks 7. Dysphagia: routine Is and Os and follow-up chemistries             continue Dysphagia 2 diet/thin-SLP Need to enc fluids    9: Hypertension: continue to monitor;  avoid hypotension   Vitals:   08/24/22 1928 08/25/22 0439  BP: 130/83 127/71  Pulse: (!) 101 99  Resp: 17 18  Temp: 97.9 F (36.6 C) 98 F (36.7 C)  SpO2: 100% 834%   -19/62 systolic elevation with mild tachy, increase diltiazem to '240mg'$  qd 10: Hyperlipidemia: LDL = 125, counsel regarding healthy diet             -continue  ezetimibe 10 mg daily             -continue atorvastatin 90 mg daily 11: Tobacco use: cessation counseling             -continue nicotine patch 21  mg 12. Constipation           -LBM 10/12 medium , type 2 - increase Senna S to BID , cont BID  miralax 13. GERD             -continue PPI 14.  Dysarthria improving continue SLP  -On dys 2 diet with thin liquids 15. Sneezing and coughing: respiratory panel negative- appears better today , ? Allergy  16. Insomnia     LOS: 24 days A FACE TO FACE EVALUATION WAS PERFORMED  Kara Bailey P Bianco Cange 08/25/2022, 3:07 PM

## 2022-08-26 NOTE — Progress Notes (Signed)
Occupational Therapy Session Note  Patient Details  Name: Kara Bailey MRN: [id number] Date of Birth: [birth year]  Today's Date: 08/26/2022 OT Individual Time: 1500-1545 OT Individual Time Calculation (min): 45 min    Short Term Goals: Week 4:  OT Short Term Goal 1 (Week 4): STGs = LTGs  Skilled Therapeutic Interventions/Progress Updates:    Upon OT arrival, pt seated in w/c reporting no pain and is agreeable to OT treatment. Treatment intervention with a focus on self care retraining and functional transfers. Pt sits in w/c to donn sweater with Min A and significant increased time. Pt was transported to ADL apartment to engage in tub transfer training with use of tub transfer bench. Discussed pt's bathroom setup. Pt was provided verbal and visual demonstration and pt completes 2 times. Pt attempts to utilize cane to hook the L LE when moving legs in and out of tub but was unsuccessful. Pt requires Max-Mod A to perform transfer and does better when completes scoot pivot transfer method. However, pt with decreased motor planning to scoot efficiently during transfer requiring mod verbal and tactile cues. Pt was returned to her room via w/c and total A and pt doffed her sweater with SBA. Pt was left in her w/c with all needs met and educated on ranging the L UE to promote ROM and decrease contracture. Pt verbalized understanding.   Therapy Documentation Precautions:  Precautions Precautions: Fall Precaution Comments: L hemi. expressive aphasia Restrictions Weight Bearing Restrictions: No    ADL: Toilet Transfer Equipment: Grab bars Tub/Shower Transfer: Maximal assistance, Moderate assistance Tub/Shower Transfer Method: Sit pivot Tub/Shower Equipment: Transfer tub bench     Therapy/Group: Individual Therapy  [name] 08/26/2022, 5:06 PM

## 2022-08-26 NOTE — Progress Notes (Signed)
PROGRESS NOTE   Subjective/Complaints: No new complaints this morning.  She is alert, feeling well  ROS:  Denies SOB,cough,chest pain, no N/V. Limited d/t severe dysarthria   Objective:   No results found. No results for input(s): "WBC", "HGB", "HCT", "PLT" in the last 72 hours.  No results for input(s): "NA", "K", "CL", "CO2", "GLUCOSE", "BUN", "CREATININE", "CALCIUM" in the last 72 hours.   Intake/Output Summary (Last 24 hours) at 08/26/2022 0946 Last data filed at 08/25/2022 2200 Gross per 24 hour  Intake 474 ml  Output --  Net 474 ml        Physical Exam: Vital Signs Blood pressure 136/67, pulse 87, temperature 98 F (36.7 C), resp. rate 15, weight 59.4 kg, SpO2 99 %. Gen: no distress, normal appearing HEENT: oral mucosa pink and moist, NCAT Cardio: Reg rate Chest: normal effort, normal rate of breathing Abd: soft, non-distended Ext: no edema Psych: pleasant, normal affect Skin: intact Neurologic: Intact comprehension, nods head to most questions, Cranial nerves II through XII intact. LUE flaccid.   PE from prior encounter:  motor strength is 5/5 in right , 0/5 left deltoid, bicep, tricep, grip, hip flexor, knee extensors, ankle dorsiflexor and plantar flexor Trace Left hip/knee ext synergy  Sensory exam pt reports some sensation but impaired LUE>LLE Musculoskeletal: Full range of motion in all 4 extremities. No joint swelling or tenderness noted   Assessment/Plan: 1. Functional deficits which require 3+ hours per day of interdisciplinary therapy in a comprehensive inpatient rehab setting. Physiatrist is providing close team supervision and 24 hour management of active medical problems listed below. Physiatrist and rehab team continue to assess barriers to discharge/monitor patient progress toward functional and medical goals  Care Tool:  Bathing    Body parts bathed by patient: Left arm, Chest,  Abdomen, Face   Body parts bathed by helper: Right arm     Bathing assist Assist Level: Moderate Assistance - Patient 50 - 74%     Upper Body Dressing/Undressing Upper body dressing   What is the patient wearing?: Pull over shirt    Upper body assist Assist Level: Supervision/Verbal cueing    Lower Body Dressing/Undressing Lower body dressing      What is the patient wearing?: Pants     Lower body assist Assist for lower body dressing: Moderate Assistance - Patient 50 - 74%     Toileting Toileting    Toileting assist Assist for toileting: Maximal Assistance - Patient 25 - 49%     Transfers Chair/bed transfer  Transfers assist  Chair/bed transfer activity did not occur: Safety/medical concerns  Chair/bed transfer assist level: Moderate Assistance - Patient 50 - 74%     Locomotion Ambulation   Ambulation assist   Ambulation activity did not occur: Safety/medical concerns  Assist level: Maximal Assistance - Patient 25 - 49% Assistive device: Walker-hemi Max distance: 65 ft   Walk 10 feet activity   Assist  Walk 10 feet activity did not occur: Safety/medical concerns  Assist level: Maximal Assistance - Patient 25 - 49% Assistive device: Walker-hemi   Walk 50 feet activity   Assist Walk 50 feet with 2 turns activity did not occur: Safety/medical concerns  Walk 150 feet activity   Assist Walk 150 feet activity did not occur: Safety/medical concerns         Walk 10 feet on uneven surface  activity   Assist Walk 10 feet on uneven surfaces activity did not occur: Safety/medical concerns         Wheelchair     Assist Is the patient using a wheelchair?: Yes Type of Wheelchair: Manual    Wheelchair assist level: Dependent - Patient 0% Max wheelchair distance: 150    Wheelchair 50 feet with 2 turns activity    Assist        Assist Level: Dependent - Patient 0%   Wheelchair 150 feet activity     Assist       Assist Level: Dependent - Patient 0%   Blood pressure 136/67, pulse 87, temperature 98 F (36.7 C), resp. rate 15, weight 59.4 kg, SpO2 99 %.  Medical Problem List and Plan: 1. 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  Antithrombotics: -DVT/anticoagulation:  Pharmaceutical: cont Lovenox , doppler without evidence of DVT              -antiplatelet therapy: continue Plavix and aspirin as PTA 3. Pain Management: tylenol as needed 4. Depression/insomnia: LCSW to evaluate and provide emotional support             -antipsychotic agents: n/a             -bupropion ER 150 mg q AM             -continue mirtazapine 30 mg q HS             -continue alprazolam 0.5 mg daily prn anxiety  -10/7 start melatonin PRN 5. Neuropsych/cognition: This patient is capable of making decisions on her own behalf. 6. Skin/Wound Care: routine skin care checks 7. Dysphagia: routine Is and Os and follow-up chemistries             continue Dysphagia 2 diet/thin-SLP Need to enc fluids    9: Hypertension: continue to monitor;  avoid hypotension   Vitals:   08/26/22 0357 08/26/22 0817  BP: 130/79 136/67  Pulse: 87   Resp: 15   Temp: 98 F (36.7 C)   SpO2: 85%    -02/77 systolic elevation with mild tachy, increase diltiazem to '240mg'$  qd  10/15: BP stable, continue regimen 10: Hyperlipidemia: LDL = 125, counsel regarding healthy diet             -continue  ezetimibe 10 mg daily             -continue atorvastatin 90 mg daily 11: Tobacco use: cessation counseling             -continue nicotine patch 21 mg 12. Constipation           -LBM 10/12 medium , type 2 - increase Senna S to BID , continue BID miralax 13.  GERD             -continue PPI 14.  Dysarthria improving continue SLP  -On dys  2 diet with thin liquids 15. Sneezing and coughing: respiratory panel negative- appears better today , ? Allergy  16. Insomnia     LOS: 25 days A FACE TO FACE EVALUATION WAS PERFORMED  Clide Deutscher Roneka Gilpin 08/26/2022, 9:46 AM

## 2022-08-27 LAB — CBC
HCT: 31.8 % — ABNORMAL LOW (ref 36.0–46.0)
Hemoglobin: 10.2 g/dL — ABNORMAL LOW (ref 12.0–15.0)
MCH: 30 pg (ref 26.0–34.0)
MCHC: 32.1 g/dL (ref 30.0–36.0)
MCV: 93.5 fL (ref 80.0–100.0)
Platelets: 326 10*3/uL (ref 150–400)
RBC: 3.4 MIL/uL — ABNORMAL LOW (ref 3.87–5.11)
RDW: 14.8 % (ref 11.5–15.5)
WBC: 7.6 10*3/uL (ref 4.0–10.5)
nRBC: 0 % (ref 0.0–0.2)

## 2022-08-27 LAB — BASIC METABOLIC PANEL
Anion gap: 9 (ref 5–15)
BUN: 13 mg/dL (ref 8–23)
CO2: 26 mmol/L (ref 22–32)
Calcium: 10 mg/dL (ref 8.9–10.3)
Chloride: 107 mmol/L (ref 98–111)
Creatinine, Ser: 0.97 mg/dL (ref 0.44–1.00)
GFR, Estimated: >60 mL/min (ref >60)
Glucose, Bld: 103 mg/dL — ABNORMAL HIGH (ref 70–99)
Potassium: 3.8 mmol/L (ref 3.5–5.1)
Sodium: 142 mmol/L (ref 135–145)

## 2022-08-27 MED ORDER — MAGNESIUM HYDROXIDE 400 MG/5ML PO SUSP
30.0000 mL | Freq: Once | ORAL | Status: AC
  Administered 2022-08-27: 30 mL via ORAL
  Filled 2022-08-27: qty 30

## 2022-08-27 MED ORDER — SORBITOL 70 % SOLN
60.0000 mL | Freq: Once | Status: AC
  Administered 2022-08-27: 60 mL via ORAL
  Filled 2022-08-27 (×2): qty 60

## 2022-08-27 NOTE — Progress Notes (Signed)
Occupational Therapy Session Note  Patient Details  Name: Kara Bailey MRN: [id number] Date of Birth: May 18, 1956  Today's Date: 08/27/2022 OT Individual Time: 2505-3976 OT Individual Time Calculation (min): 45 min    Short Term Goals: Week 3:  OT Short Term Goal 1 (Week 3): Pt will return demo self-ROM techniques with independence on 2 sessions to demo recall and understanding of LUE NMR. OT Short Term Goal 1 - Progress (Week 3): Met OT Short Term Goal 2 (Week 3): Pt will complete toileting tasks with moderate assistance x 1. OT Short Term Goal 2 - Progress (Week 3): Met  Skilled Therapeutic Interventions/Progress Updates:      Therapy Documentation Precautions:  Precautions Precautions: Fall Precaution Comments: L hemi. expressive aphasia Restrictions Weight Bearing Restrictions: No General:   Vital Signs:   Pain: Pain Assessment Pain Scale: 0-10 Pain Score: 6  Faces Pain Scale: Hurts whole lot [id number] seen for ADL's. Patient requesting to bathe at the sink vs getting into the shower. Patient able to assist with upper body and upper legs, but needed assist with feet and bathing that was performed in standing. Patient required min assist with donning and adjusting bra before showing good carry over of hemi dressing technique for Ue's. Patient with tightness in the LUE making it difficult to adjust long sleeved top without physical assist.  Donning pants patient required assist to get LLE into the pant leg and total assist to don brief. Patient stood with hemi walker for clothing management with Fair balance. Continued with grooming tasks at w/c level at the sink. Continue with skilled OT to improve self care skills and improve functional use of the LUE.  ADL Eating: Set up Grooming: Setup (.) Where Assessed-Grooming: Wheelchair, Sitting at sink Upper Body Bathing: Supervision/safety, Minimal cueing (using a long sponge to reach RUE) Where Assessed-Upper Body Bathing:  Shower Lower Body Bathing: Minimal assistance (A to wash bottom) Where Assessed-Lower Body Bathing: Shower Upper Body Dressing: Minimal assistance Where Assessed-Upper Body Dressing: Wheelchair Lower Body Dressing:  (dressing from wc level, standing with R hand on bar for therapist to pull pants over hips) Where Assessed-Lower Body Dressing: Wheelchair Toileting: Moderate assistance (A with clothing management) Where Assessed-Toileting: Glass blower/designer: Minimal assistance (using bar for support, sq pivot) Toilet Transfer Method: Engineer, water: Grab bars Tub/Shower Transfer: Maximal assistance, Moderate assistance Tub/Shower Transfer Method: Sit pivot Tub/Shower Equipment: Facilities manager: Environmental education officer Method: Education officer, environmental: Radio broadcast assistant, Grab bars ADL Comments: Pt able to recall hemi-techniques with grooming tasks learned in previous OT session. Vision Baseline Vision/History: 1 Wears glasses Patient Visual Report: No change from baseline Vision Assessment?: No apparent visual deficits Perception  Perception: Impaired Inattention/Neglect: Does not attend to left side of body   Therapy/Group: Individual Therapy  Hermina Barters 08/27/2022, 12:36 PM

## 2022-08-27 NOTE — Progress Notes (Addendum)
Speech Language Pathology Discharge Summary  Patient Details  Name: Kara Bailey MRN: [id number] Date of Birth: 1956/01/06  Date of Discharge from [place] 26, 2023  Patient has met 4 of 4 long term goals.  Patient to discharge at overall Supervision level.  Reasons goals not met: All goals met   Clinical Impression/Discharge Summary: Pt has demonstrated functional progress towards swallowing and communication goals meeting 4 out of 4 long-term goals this admission. Patient is currently communicating verbally and most successfully at the word level with min-to-mod A for implementation of speech strategies including breath support techniques, over-articulation, and pausing between syllables and words to enhance speech intelligibility. Pt does well with carry over during sessions, however carry over between sessions continues to fluctuate day-to-day with ongoing need for reminders. Pt continues to exhibit significant oral deficits and is currently consuming a dysphagia 2 diet with thin liquids and mod I for implementation of swallowing precautions and strategies. Pt's daughter and sister have been briefly educated on discharge recommendations and the need for 24 hour supervision. Educational handouts were provided to both daughter and sister containing information on diet recommendations, meal preparation, and textures/foods to avoid. Education opportunities were limited despite multiple attempts to have family members involved during scheduled family training sessions. Recommend ongoing ST intervention at next level of care in home health setting  to maximize communication skills, swallowing, and functional independence.  Care Partner:  Caregiver Able to Provide Assistance: Yes  Type of Caregiver Assistance: Cognitive;Physical Recommendation:  24 hour supervision/assistance;Home Health SLP  Rationale for SLP Follow Up: Maximize functional communication;Reduce caregiver burden    Equipment: Pt sent home with notebook for written communication as needed   Reasons for discharge: Treatment goals met;Discharged from hospital   Patient/Family Agrees with Progress Made and Goals Achieved: Yes    [name] 09/06/2022, 12:21 PM

## 2022-08-27 NOTE — Progress Notes (Signed)
Occupational Therapy Session Note  Patient Details  Name: Kara Bailey MRN: [id number] Date of Birth: July 25, 1956  Today's Date: 08/27/2022 OT Individual Time: 0930-1030 OT Individual Time Calculation (min): 60 min    Short Term Goals: Week 4:  OT Short Term Goal 1 (Week 4): STGs = LTGs  Skilled Therapeutic Interventions/Progress Updates:    Pt received in wc dressed and ready for the day.  Pt is now using oral communication more clearly and I was able to understand her responses to recall and orientation questions. Pt taken to gym to work on a HEP she can use at home. HEP focused on tone reduction and PROM arm exercises along with dynamic sit balance.   Pt then, worked on sit to stand from mat to [employer] to work on [employer] standing with and without UE support Tax inspector for feedback. Pt worked on wt shifting on R foot to lateral side and shifting to R with hip when she would lean to L.  Had pt focus on finding her midline and concentrating on holding that for several minutes at a time. She also needs significant cues to keep head in midline especially with sit to stands.   She did progress throughout the session. Min A sq pivot back to wc to L .  Pt returned to room with all needs met.   Therapy Documentation Precautions:  Precautions Precautions: Fall Precaution Comments: L hemi. expressive aphasia Restrictions Weight Bearing Restrictions: No   Pain: Pt stated she took tylenol prior to therapy  ADL: ADL Eating: Set up Grooming: Setup (.) Where Assessed-Grooming: Wheelchair, Sitting at sink Upper Body Bathing: Supervision/safety, Minimal cueing (using a long sponge to reach RUE) Where Assessed-Upper Body Bathing: Shower Lower Body Bathing: Minimal assistance (A to wash bottom) Where Assessed-Lower Body Bathing: Shower Upper Body Dressing: Minimal assistance Where Assessed-Upper Body Dressing: Wheelchair Lower Body Dressing:  (dressing from wc level, standing with R  hand on bar for therapist to pull pants over hips) Where Assessed-Lower Body Dressing: Wheelchair Toileting: Moderate assistance (A with clothing management) Where Assessed-Toileting: Glass blower/designer: Minimal assistance (using bar for support, sq pivot) Toilet Transfer Method: Engineer, water: Grab bars Tub/Shower Transfer: Maximal assistance, Moderate assistance Tub/Shower Transfer Method: Sit pivot Tub/Shower Equipment: Facilities manager: Environmental education officer Method: Education officer, environmental: Radio broadcast assistant, Grab bars ADL Comments: Pt able to recall hemi-techniques with grooming tasks learned in previous OT session.  Therapy/Group: Individual Therapy  Hayden Lake 08/27/2022, 9:55 AM

## 2022-08-27 NOTE — Progress Notes (Signed)
PROGRESS NOTE   Subjective/Complaints:  No issues overnite but pt getting anxious regarding discharge, we discussed process and follow ups  ROS:  Denies SOB,cough,chest pain, no N/V. Limited d/t severe dysarthria   Objective:   No results found. Recent Labs    08/27/22 0559  WBC 7.6  HGB 10.2*  HCT 31.8*  PLT 326    Recent Labs    08/27/22 0559  NA 142  K 3.8  CL 107  CO2 26  GLUCOSE 103*  BUN 13  CREATININE 0.97  CALCIUM 10.0     Intake/Output Summary (Last 24 hours) at 08/27/2022 0831 Last data filed at 08/26/2022 0915 Gross per 24 hour  Intake 177 ml  Output --  Net 177 ml         Physical Exam: Vital Signs Blood pressure 126/80, pulse 85, temperature 98.7 F (37.1 C), resp. rate 16, weight 59.4 kg, SpO2 100 %.   General: No acute distress Mood and affect are appropriate Heart: Regular rate and rhythm no rubs murmurs or extra sounds Lungs: Clear to auscultation, breathing unlabored, no rales or wheezes Abdomen: Positive bowel sounds, soft nontender to palpation, nondistended Extremities: No clubbing, cyanosis, or edema Skin: No evidence of breakdown, no evidence of rash  PE from prior encounter:  motor strength is 5/5 in right , 0/5 left deltoid, bicep, tricep, grip, hip flexor, knee extensors, ankle dorsiflexor and plantar flexor Trace Left hip/knee ext synergy  Sensory exam pt reports some sensation but impaired LUE>LLE Musculoskeletal: Full range of motion in all 4 extremities. No joint swelling or tenderness noted   Assessment/Plan: 1. Functional deficits which require 3+ hours per day of interdisciplinary therapy in a comprehensive inpatient rehab setting. Physiatrist is providing close team supervision and 24 hour management of active medical problems listed below. Physiatrist and rehab team continue to assess barriers to discharge/monitor patient progress toward functional and  medical goals  Care Tool:  Bathing    Body parts bathed by patient: Left arm, Chest, Abdomen, Face   Body parts bathed by helper: Right arm     Bathing assist Assist Level: Moderate Assistance - Patient 50 - 74%     Upper Body Dressing/Undressing Upper body dressing   What is the patient wearing?: Pull over shirt    Upper body assist Assist Level: Supervision/Verbal cueing    Lower Body Dressing/Undressing Lower body dressing      What is the patient wearing?: Pants     Lower body assist Assist for lower body dressing: Moderate Assistance - Patient 50 - 74%     Toileting Toileting    Toileting assist Assist for toileting: Maximal Assistance - Patient 25 - 49%     Transfers Chair/bed transfer  Transfers assist  Chair/bed transfer activity did not occur: Safety/medical concerns  Chair/bed transfer assist level: Moderate Assistance - Patient 50 - 74%     Locomotion Ambulation   Ambulation assist   Ambulation activity did not occur: Safety/medical concerns  Assist level: Maximal Assistance - Patient 25 - 49% Assistive device: Walker-hemi Max distance: 65 ft   Walk 10 feet activity   Assist  Walk 10 feet activity did not occur: Safety/medical concerns  Assist level: Maximal Assistance - Patient 25 - 49% Assistive device: Walker-hemi   Walk 50 feet activity   Assist Walk 50 feet with 2 turns activity did not occur: Safety/medical concerns         Walk 150 feet activity   Assist Walk 150 feet activity did not occur: Safety/medical concerns         Walk 10 feet on uneven surface  activity   Assist Walk 10 feet on uneven surfaces activity did not occur: Safety/medical concerns         Wheelchair     Assist Is the patient using a wheelchair?: Yes Type of Wheelchair: Manual    Wheelchair assist level: Dependent - Patient 0% Max wheelchair distance: 150    Wheelchair 50 feet with 2 turns activity    Assist         Assist Level: Dependent - Patient 0%   Wheelchair 150 feet activity     Assist      Assist Level: Dependent - Patient 0%   Blood pressure 126/80, pulse 85, temperature 98.7 F (37.1 C), resp. rate 16, weight 59.4 kg, SpO2 100 %.  Medical Problem List and Plan: 1. 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  Antithrombotics: -DVT/anticoagulation:  Pharmaceutical: cont Lovenox , doppler without evidence of DVT              -antiplatelet therapy: continue Plavix and aspirin as PTA 3. Pain Management: tylenol as needed 4. Depression/insomnia: LCSW to evaluate and provide emotional support             -antipsychotic agents: n/a             -bupropion ER 150 mg q AM             -continue mirtazapine 30 mg q HS             -continue alprazolam 0.5 mg daily prn anxiety  -10/7 start melatonin PRN 5. Neuropsych/cognition: This patient is capable of making decisions on her own behalf. 6. Skin/Wound Care: routine skin care checks 7. Dysphagia: routine Is and Os and follow-up chemistries             continue Dysphagia 2 diet/thin-SLP Need to enc fluids    9: Hypertension: continue to monitor;  avoid hypotension   Vitals:   08/26/22 1911 08/27/22 0312  BP: 135/72 126/80  Pulse: (!) 103 85  Resp: 18 16  Temp: (!) 97.4 F (36.3 C) 98.7 F (37.1 C)  SpO2: 100% 100%   10/16 BP normal cont Cardizem CD '240mg'$  per day  10: Hyperlipidemia: LDL = 125, counsel regarding healthy diet             -continue  ezetimibe 10 mg daily             -continue atorvastatin 90 mg daily 11: Tobacco use: cessation counseling             -continue nicotine patch 21 mg 12. Constipation           -LBM 10/12  medium , type 2 - increase Senna S to BID , continue BID miralax 13. GERD             -was not taking PPI at home, was taking famotidine qhs  14.  Dysarthria improving continue SLP  -On dys 2 diet with thin liquids  15. Insomnia- remeron '30mg'$  qhs      LOS: 26 days A FACE TO FACE EVALUATION WAS PERFORMED  Charlett Blake 08/27/2022, 8:31 AM

## 2022-08-27 NOTE — NC FL2 (Signed)
[place] LEVEL OF CARE SCREENING TOOL     IDENTIFICATION  Patient Name: Kara Bailey Birthdate: 12-Mar-1956 Sex: female Admission Date (Current Location): 08/01/2022  [place] and [place] Number:  Kathleen Argue [id number] [place] and Address:  [facility] [place], [place] [address], [place], [place] [address]      Provider Number: [id number]  Attending Physician Name and Address:  Charlett Blake, MD  Relative Name and Phone Number:  Raushanah Osmundson [phone]    Current Level of Care: Other (Comment) (rehab) Recommended Level of Care: [place] Prior Approval Number:    Date Approved/Denied:   PASRR Number: [id number]  Discharge Plan: SNF    Current Diagnoses: Patient Active Problem List   Diagnosis Date Noted   Ischemic stroke ([place]) 08/01/2022   CVA (cerebral vascular accident) ([employer]) 07/27/2022   Stroke ([place]) 07/26/2022   Left hemiplegia ([place]) 07/26/2022   Acute CVA (cerebrovascular accident) ([place]) 12/29/2020   Peripheral arterial disease ([place]) 12/20/2020   History of esophageal stricture 07/08/2020   Insomnia 12/02/2017   Chronic cough 08/14/2017   Poor appetite 08/29/2016   Peptic stricture of esophagus    History of colonic polyps    Hiatal hernia 03/16/2016   Dysphagia 03/16/2016   Alcohol abuse 10/26/2015   Essential hypertension 09/26/2015   PAC (premature atrial contraction) 10/11/2014   PVC (premature ventricular contraction) 10/11/2014   Back muscle spasm 06/15/2014   Generalized anxiety disorder 06/15/2014   Smoking 06/15/2014   Chronic hepatitis C without hepatic coma ([place]) 05/28/2013    Orientation RESPIRATION BLADDER Height & Weight     Self, Place  Normal Continent Weight: 130 lb 15.3 oz (59.4 kg) Height:     BEHAVIORAL SYMPTOMS/MOOD NEUROLOGICAL BOWEL NUTRITION STATUS      Continent Diet (Dys 2 thin liquids)  AMBULATORY STATUS COMMUNICATION OF NEEDS Skin   Extensive Assist Verbally Normal                        Personal Care Assistance Level of Assistance  Bathing, Feeding, Dressing Bathing Assistance: Limited assistance Feeding assistance: Limited assistance Dressing Assistance: Limited assistance     Functional Limitations Therapist, nutritional, Speech Sight Info: Adequate   Speech Info: Impaired    SPECIAL CARE FACTORS FREQUENCY  PT (By licensed PT), OT (By licensed OT), Speech therapy     PT Frequency: 5x week OT Frequency: 5x week     Speech Therapy Frequency: 5x week      Contractures Contractures Info: Not present    Additional Factors Info  Code Status, Allergies Code Status Info: Full Code Allergies Info: NKDA           Current Medications (08/27/2022):  This is the current hospital active medication list Current Facility-Administered Medications  Medication Dose Route Frequency Provider Last Rate Last Admin   acetaminophen (TYLENOL) tablet 325-650 mg  325-650 mg Oral Q4H PRN Barbie Banner, PA-C   650 mg at 08/27/22 0919   ALPRAZolam (XANAX) tablet 0.5 mg  0.5 mg Oral Daily PRN Barbie Banner, PA-C   0.5 mg at 08/26/22 2128   alum & mag hydroxide-simeth (MAALOX/MYLANTA) 200-200-20 MG/5ML suspension 30 mL  30 mL Oral Q4H PRN Barbie Banner, PA-C       aspirin chewable tablet 81 mg  81 mg Oral Daily Barbie Banner, PA-C   81 mg at 08/27/22 0906   atorvastatin (LIPITOR) tablet 80 mg  80 mg Oral Daily Setzer,  Edman Circle, PA-C   80 mg at 08/27/22 0859   bisacodyl (DULCOLAX) EC tablet 5 mg  5 mg Oral Daily PRN Barbie Banner, PA-C       bisacodyl (DULCOLAX) suppository 10 mg  10 mg Rectal Daily PRN Jennye Boroughs, MD   10 mg at 08/19/22 1616   buPROPion (WELLBUTRIN SR) 12 hr tablet 150 mg  150 mg Oral q AM Barbie Banner, PA-C   150 mg at 08/27/22 7616   clopidogrel (PLAVIX) tablet 75 mg  75 mg Oral Daily Barbie Banner, PA-C   75 mg at 08/27/22 0737   diltiazem (CARDIZEM CD) 24 hr capsule 240 mg  240 mg Oral Daily Charlett Blake, MD    240 mg at 08/27/22 1062   diphenhydrAMINE (BENADRYL) 12.5 MG/5ML elixir 12.5-25 mg  12.5-25 mg Oral Q6H PRN Barbie Banner, PA-C       enoxaparin (LOVENOX) injection 40 mg  40 mg Subcutaneous Q24H Barbie Banner, PA-C   40 mg at 08/27/22 1111   ezetimibe (ZETIA) tablet 10 mg  10 mg Oral Daily Barbie Banner, PA-C   10 mg at 08/27/22 6948   feeding supplement (ENSURE ENLIVE / ENSURE PLUS) liquid 237 mL  237 mL Oral TID BM Barbie Banner, PA-C   237 mL at 08/26/22 0941   guaiFENesin-dextromethorphan (ROBITUSSIN DM) 100-10 MG/5ML syrup 5-10 mL  5-10 mL Oral Q6H PRN Barbie Banner, PA-C       melatonin tablet 3 mg  3 mg Oral QHS Jennye Boroughs, MD   3 mg at 08/26/22 2121   mirtazapine (REMERON) tablet 30 mg  30 mg Oral QHS Barbie Banner, PA-C   30 mg at 08/26/22 2121   nicotine (NICODERM CQ - dosed in mg/24 hours) patch 21 mg  21 mg Transdermal Daily Barbie Banner, PA-C   21 mg at 08/27/22 5462   Oral care mouth rinse  15 mL Mouth Rinse 4 times per day Charlett Blake, MD   15 mL at 08/27/22 7035   Oral care mouth rinse  15 mL Mouth Rinse PRN Kirsteins, Luanna Salk, MD       pantoprazole (PROTONIX) EC tablet 40 mg  40 mg Oral Daily Barbie Banner, PA-C   40 mg at 08/27/22 0900   polyethylene glycol (MIRALAX / GLYCOLAX) packet 17 g  17 g Oral BID Durel Salts C, DO   17 g at 08/26/22 2121   prochlorperazine (COMPAZINE) tablet 5-10 mg  5-10 mg Oral Q6H PRN Barbie Banner, PA-C       Or   prochlorperazine (COMPAZINE) injection 5-10 mg  5-10 mg Intramuscular Q6H PRN Barbie Banner, PA-C       Or   prochlorperazine (COMPAZINE) suppository 12.5 mg  12.5 mg Rectal Q6H PRN Setzer, Edman Circle, PA-C       senna-docusate (Senokot-S) tablet 1 tablet  1 tablet Oral BID Charlett Blake, MD   1 tablet at 08/27/22 0859   sodium phosphate (FLEET) 7-19 GM/118ML enema 1 enema  1 enema Rectal Once PRN Setzer, Edman Circle, PA-C       sorbitol 70 % solution 60 mL  60 mL Oral Once Setzer, Edman Circle, PA-C       tiZANidine (ZANAFLEX) tablet 2 mg  2 mg Oral QHS Kirsteins, Luanna Salk, MD   2 mg at 08/26/22 2121     Discharge Medications: Please see discharge summary for a list of discharge medications.  Relevant Imaging Results:  Relevant Lab Results:   Additional Information SSN: [id number]  Nyra Anspaugh, Gardiner Rhyme, LCSW

## 2022-08-27 NOTE — Progress Notes (Signed)
Spoke to son [name] who had concerns about discharge in am .  Hospital bed and some other equipment has not arrived.  Also concerns about going to her apt due to lack of physical assist from boyfriend.  Son's home is multilevel and has no ramp.  Discussed with SW who will call pt's son [name] to discuss concerns.

## 2022-08-27 NOTE — Progress Notes (Signed)
Physical Therapy Session Note  Patient Details  Name: Kara Bailey MRN: [id number] Date of Birth: 1956/08/09  Today's Date: 08/27/2022 PT Individual Time: 1115-1200 PT Individual Time Calculation (min): 45 min   Short Term Goals: Week 3:  PT Short Term Goal 1 (Week 3): Pt will transfer to and from [facility] with min assist PT Short Term Goal 2 (Week 3): Pt wil ambulate 31f with max A +2. PT Short Term Goal 3 (Week 3): Pt will propell WC 1579fwith CGA PT Short Term Goal 4 (Week 3): Pt will perform 5xSTS to assess fall risk  Skilled Therapeutic Interventions/Progress Updates:    Pt seated in w/c on arrival and agreeable to therapy. No complaint of pain. Session focused on d/c assessment as documented below and in d/c note. Pt participated in motor and sensory testing in sitting. Pt transported to therapy gym for time management and energy conservation. Pt performed car transfer with mod A, able to recall steps once set up at car simulator. Mod A required for squat pivot into car but pt able to manage RLE without assist. Squat pivot to/from mat table with min A. Minimal to no cueing required for familiar transfers. Pt then participated in FIST as documented below, scoring 42, improved from 20 at eval. Pt returned to room and remained in w/c with all needs in reach.   Therapy Documentation Precautions:  Precautions Precautions: Fall Precaution Comments: L hemi. expressive aphasia Restrictions Weight Bearing Restrictions: No General:    Trunk/Postural Assessment : Cervical Assessment Cervical Assessment: Within Functional Limits Thoracic Assessment Thoracic Assessment: Within Functional Limits Lumbar Assessment Lumbar Assessment: Within Functional Limits Postural Control Postural Control: Deficits on evaluation Trunk Control: strong L lean in standing, improved from baseline  Balance: Balance Balance Assessed: Yes Standardized Balance Assessment Standardized Balance Assessment:  FIST Function in Sitting Test = FIST Anterior Nudge: superior sternum: Verbal cues/increased time Posterior Nudge: between scapular spines: Verbal cues/increased time Lateral Nudge: to dominant side at acromion: Verbal cues/increased time Static Sitting: 30 seconds: Independent Sitting, Shake "No": left and right: Independent Sitting, Eyes Closed: 30 seconds: Verbal cues/increased time Sitting, Lift Foot: dominant side, lift foot 1 inch twice: Verbal cues/increased time Pick Up Object From Behind: object at midline, hands breadth posterior: Independent Forward Reach: use dominant arm, must complete full motion: Verbal cues/increased time Lateral Reach: use dominant arm, clear opposite ischial tuberosity: Verbal cues/increased time Pick Up Object From Floor: from between feet: Verbal cues/increased time Posterior Scooting: move backwards 2 inches: Upper extremity support Anterior Scooting: move forward 2 inches: Upper extremity support Lateral Scooting: move to dominent side 2 inches: Upper extremity support FIST TOTAL SCORE: 42 Static Sitting Balance Static Sitting - Balance Support: Right upper extremity supported Static Sitting - Level of Assistance: 6: Modified independent (Device/Increase time) Dynamic Sitting Balance Dynamic Sitting - Balance Support: Right upper extremity supported Dynamic Sitting - Level of Assistance: 5: Stand by assistance Static Standing Balance Static Standing - Balance Support: Right upper extremity supported Static Standing - Level of Assistance: 4: Min assist Dynamic Standing Balance Dynamic Standing - Balance Support: Right upper extremity supported Dynamic Standing - Level of Assistance: 2: Max assist     Therapy/Group: Individual Therapy  OlMickel Fuchs0/16/2023, 12:24 PM

## 2022-08-27 NOTE — Progress Notes (Signed)
Occupational Therapy Session Note  Patient Details  Name: Kara Bailey MRN: [id number] Date of Birth: 05-30-1956  Today's Date: 08/27/2022 OT Individual Time: 1500-1609 OT Individual Time Calculation (min): 64 min    Short Term Goals: Week 4:  OT Short Term Goal 1 (Week 4): STGs = LTGs  Skilled Therapeutic Interventions/Progress Updates:    Upon OT arrival, pt seated in w/c requesting to use the bathroom and reports no pain. Pt was transported into bathroom via w/c and total A and completes toilet transfer with Mod A and toileting with Max A. Pt did have a large BM and LPN notified by pt. Increased time required to perform toileting tasks and transfer secondary to decreased strength and decreased motor planning. Pt was transported to sink via w/c and total A and sits at sink to complete hand hygiene with Supervision. Pt ecpresses excitement about her new w/c arriving today and requests to get into it. Extensive education provided on w/c management including management of arm rest, brakes, and leg rests and pt practices managing w/c with mod difficulty. Pt completes scoot pivot transfer into w/c to R side with Mod A and propels herself to the dayroom with SBA. Pt states "It's so much easier" with a smile on her face as she propels herself. Pt was positioned infornt of tabletop and completes 2 sit to stand transfers with Mod a and stands at tabletop anywhere from Mod A-CGA with L knee guarded to prevent knee buckling and flips over quirkle game pieces from the blank side to the colored side using the R UE. Pt requires max verbal and min tactile cues for weight shifting and posture throughout. Pt requires one seated rest break. Pt then sits in her w/c to cross midline and place game pieces one at a time into the game bag using the R UE and 1.5lb wrist weight. Pt completes half from R to L and the other half from L to R. Pt performs a large arc motion when placing game pieces back into bag. Pt then  propels herself back to her room and was left in the w/c with all needs met. Pt requesting water and attempts to drink from her cup with a straw. Pt with difficulty and was provided a cup of water but pt reports "it tastes salty". Lid was removed from her water bottle and pt reports the water tastes better in the water bottle. Other cup was tossed out.   Therapy Documentation Precautions:  Precautions Precautions: Fall Precaution Comments: L hemi. expressive aphasia Restrictions Weight Bearing Restrictions: No    ADL: Grooming: Supervision/safety Where Assessed-Grooming: Sitting at sink Toileting: Maximal assistance Where Assessed-Toileting: Glass blower/designer: Moderate assistance Toilet Transfer Method: Squat pivot Toilet Transfer Equipment: Grab bars    Therapy/Group: Individual Therapy  [name] 08/27/2022, 5:18 PM

## 2022-08-27 NOTE — Progress Notes (Signed)
Patient ID: Kara Bailey, female   DOB: 1956/09/19, 66 y.o.   MRN: [id number]  Have sent FL2 out to area facilities to see if any would offer a bed. Will need to delay discharge to see what can be found and if insurance will cover this option.

## 2022-08-27 NOTE — Progress Notes (Addendum)
Patient ID: Kara Bailey, female   DOB: 08-22-1956, 66 y.o.   MRN: [id number]  Reached out to [employer] to contact pt's son-ricky regarding equipment delivery and to confirm discharge is tomorrow.  12:06 Pm Spoke with son Kara Bailey who has numerous concerns regarding pt going home tomorrow. He feels she needs to go to a NH for longer and more rehab due to she will not have 24/7 care at home and her boyfriend is in a wheelchair and can not physically assist her. Between he and his sister they will try to provide care but it will not be 24/7. Concern is once home pt will try to do more and be a higher fall risk. Discussed since came to rehab her insurance may not cover going to a SNF. He wants to try to see if they would. Discussed he will need to give [place] 3-4 facilities which we can see if can offer a bed then will need to see if insurance will cover. Son will also need to discuss with Mom since she wants to go home. Have emailed son a NH list and will complete FL2 and sent out to see if any facility will offer. Still working on [employer] and equipment delivery.

## 2022-08-28 NOTE — Progress Notes (Signed)
Physical Therapy Session Note  Patient Details  Name: Kara Bailey MRN: [id number] Date of Birth: 01-22-1956  Today's Date: 08/28/2022 PT Individual Time: 1031-1103 PT Individual Time Calculation (min): 32 min  and Today's Date: 08/28/2022 PT Missed Time: 13 Minutes Missed Time Reason: Patient unwilling to participate (Pt is upset that she is not going home and son is looking for placement in SNF.)  Short Term Goals: Week 3:  PT Short Term Goal 1 (Week 3): Pt will transfer to and from [facility] with min assist PT Short Term Goal 2 (Week 3): Pt wil ambulate 20f with max A +2. PT Short Term Goal 3 (Week 3): Pt will propell WC 1596fwith CGA PT Short Term Goal 4 (Week 3): Pt will perform 5xSTS to assess fall risk Week 4:     Skilled Therapeutic Interventions/Progress Updates:  Patient seated upright in w/c on entrance to room. Seated in loaner w/c from [employer]. Patient alert but not agreeable to PT session. Pt states, "I'm not doing no more therapy." She is very upset that she is now not going home. Was supposed to leave today for home, but son has concerns re: provision of 24/7 assist once home. Is requesting SNF placement now for pt. Continues to state, "nobody wants me" and "I'm going to a nursing home". Pt emotionally labile and requires therapeutic listening and comforting. Related personal story of previous pt who required 2.5 months prior to return of sensation and then improving to ability to ambulate with HW and light assist from family. Related to pt that with return of function, there is possibility to return home with family and not to give up even if it takes >a year. Pt thoroughly frustrated with family and does not want to speak to son again. Continued encouragement provided to pt as [facility]   Patient with no pain complaint throughout session.    Therapy Documentation Precautions:  Precautions Precautions: Fall Precaution  Comments: L hemi. expressive aphasia Restrictions Weight Bearing Restrictions: No General:   Vital Signs: Therapy Vitals Temp: (!) 97.4 F (36.3 C) Pulse Rate: 72 Resp: 16 BP: 110/89 Patient Position (if appropriate): Sitting Oxygen Therapy SpO2: 95 % O2 Device: Room Air Pain:  No pain this session.   Therapy/Group: Individual Therapy  JuAlger SimonsT, DPT, CSRS 08/28/2022, 5:25 PM

## 2022-08-28 NOTE — Progress Notes (Signed)
Physical Therapy Session Note  Patient Details  Name: HOLLE SPRICK MRN: [id number] Date of Birth: 10/06/1956  Today's Date: 08/28/2022 PT Individual Time: 3875-6433 PT Individual Time Calculation (min): 29 min   Short Term Goals: Week 3:  PT Short Term Goal 1 (Week 3): Pt will transfer to and from [facility] with min assist PT Short Term Goal 2 (Week 3): Pt wil ambulate 21f with max A +2. PT Short Term Goal 3 (Week 3): Pt will propell WC 1574fwith CGA PT Short Term Goal 4 (Week 3): Pt will perform 5xSTS to assess fall risk  Skilled Therapeutic Interventions/Progress Updates:     Pt greeted supine in bed with HOB elevated and agreeable to therapy. No c/o pain. Pt becoming emotional over changed discharge planning and difficulty with communication, emotional support and encouragement given. Once calmed down, pt stating she would like her brief changed.   Multiple bridges performed in bed with max assist to position and stabilize L LE and pt rolling with min assist in both directions while doffing soiled brief, donning new brief, and donning sweat pants.  Supine>sit with mod assist for trunk and L LE management.  Scooting to EOB required min A for L hip assist.  Stand pivot transfer with mod assist to the right with L knee blocking and cues for limb placement and sequencing.   Pt left seated in WC with brakes on, posey belt on, L UE elevated with pillow, call bell in reach, all needs met.   Therapy Documentation Precautions:  Precautions Precautions: Fall Precaution Comments: L hemi. expressive aphasia Restrictions Weight Bearing Restrictions: No   Therapy/Group: Individual Therapy  AnKayleen MemosSPT  08/28/2022, 8:03 AM

## 2022-08-28 NOTE — Progress Notes (Signed)
Occupational Therapy Session Note  Patient Details  Name: Kara Bailey MRN: [id number] Date of Birth: 1956/07/02  Today's Date: 08/28/2022 OT Individual Time: 0915-1000 OT Individual Time Calculation (min): 45 min    Short Term Goals: Week 3:  OT Short Term Goal 1 (Week 3): Pt will return demo self-ROM techniques with independence on 2 sessions to demo recall and understanding of LUE NMR. OT Short Term Goal 1 - Progress (Week 3): Met OT Short Term Goal 2 (Week 3): Pt will complete toileting tasks with moderate assistance x 1. OT Short Term Goal 2 - Progress (Week 3): Met  Skilled Therapeutic Interventions/Progress Updates:     Pt received in room sitting upright in loaner w/c from [employer]. Pt initially not agreeable to OT session stating that "therapy is pointless now". Pt emotionally labile and was provided with therapeutic listening and support. Pt is very upset regarding recent news that she is no longer going home to family, but will likely go to a SNF instead. OT suggested speaking with SW to discuss dc plans to provide more comfort and information to pt. SW reported they are still working on finalizing her dc location. Pt more willing to participate in therapy following conversation with SW. Pt propelled herself down the hallway into the therapy gym. Tone noted in Pt's LUE and mild pain with movement. Pt participated in NMR activity while seated at table to decrease pain and facilitate more functional use of LUE. Arm on body stretches, WB on LUE, and ROM of LUE facilitated with support provided from therapist. During session, pt became emotionally labile again and was provided with therapeutic listening and support. OT attempted to engage pt in therapeutic game, but pt ceased participation after a few minutes and became emotional labile again. Pt requested to return to room and propelled self to room in wc. Upon arriving to room, pt was provided with a wash cloth and washed her face  utilizing her RUE. Pt was left in her room sitting in her wc with seat belt alarm on, call bell in reach, and all needs met.   Therapy Documentation Precautions:  Precautions Precautions: Fall Precaution Comments: L hemi. expressive aphasia Restrictions Weight Bearing Restrictions: No General: General PT Missed Treatment Reason: Patient unwilling to participate (Pt is upset that she is not going home and son is looking for placement in SNF.) Vital Signs: Therapy Vitals Temp: (Abnormal) 97.4 F (36.3 C) Pulse Rate: 72 Resp: 16 BP: 110/89 Patient Position (if appropriate): Sitting Oxygen Therapy SpO2: 95 % O2 Device: Room Air   Lowella Fairy, OTR/L Therapy/Group: Individual Therapy  Janey Genta 08/28/2022, 3:28 PM

## 2022-08-28 NOTE — Progress Notes (Signed)
slPhysical Therapy Session Note  Patient Details  Name: CHELLIE VANLUE MRN: [id number] Date of Birth: 1956/08/10  Today's Date: 08/28/2022 PT Individual Time: 1535-1616 PT Individual Time Calculation (min): 41 min   Short Term Goals: Week 3:  PT Short Term Goal 1 (Week 3): Pt will transfer to and from [facility] with min assist PT Short Term Goal 2 (Week 3): Pt wil ambulate 13f with max A +2. PT Short Term Goal 3 (Week 3): Pt will propell WC 1554fwith CGA PT Short Term Goal 4 (Week 3): Pt will perform 5xSTS to assess fall risk  Skilled Therapeutic Interventions/Progress Updates:    Pt received sitting in w/c with her boyfriend present. Pt reports she is waiting to hear back from SW about possible SNF placement - pt uses written communication to express this.   Pt already wearing L LE Sprystep Max GRAFO and shoe with leather toe-cap.  Performed R hemi-technique w/c propulsion ~15046fo main therapy gym with supervision and cuing for L attention - pt able to manage brakes with verbal cuing but requires assistance to don/doff w/c leg rests.  Sit>stand w/c>R UE support [facility] for lifting hips, anterior weight shifting, and blocking L knee while facilitating increased L hip extension and for balance due to L lean.  Gait training 8f67fing HW with mod assist for balance and L LE management. Pt demonstrates the following gait deviations with therapist providing the described cuing and facilitation for improvement:  - achieves step-to pattern leading with L LE - requires total assist to advance L LE during swing phase - requires mod assist to block L knee during stance and facilitate increased hip extension as well as L weight shift with cuing to achieve these movements with decreased physical assistance  - cuing for [facility] and sequencing to allow pt improved upright posture and increased support during L stance phase - very slow gait speed  R squat pivot  transfer w/c>EOM x2 with mod assist of 1 for lifting/pivoting hips and cuing for head/hips relationship with pt attempting to move head in direction of her hips and she is unable to come to stand nor pivot hips due to this - therapist educated pt and facilitated improved technique but pt with difficulty achieving squat pivot mechanics towards the R successfully.  L squat pivot EOM>w/c x2 with lighter mod assist for lifting/pivoting hips - pt does better achieving proper head/hips relationship in this direction as she feels confident leaning over her R, unaffected side.  Sit>supine on mat with min assist for L LE management up onto mat and supine>sitting R EOM with min assist again for L LE management.  Performed R hemi-technique w/c propulsion ~100ft47fk towards room with supervision as described above then transported remainder of distance due to fatigue.   At end of session, pt left seated in w/c with needs in reach, L UE supported on arm trough (due to flexor synergy with excessive shoulder internal rotation, pt's arm does not stay rested on the support very well), seat belt alarm on, and her significant other present.    Therapy Documentation Precautions:  Precautions Precautions: Fall Precaution Comments: L hemi. expressive aphasia Restrictions Weight Bearing Restrictions: No  Pain:  Denies pain during session.    Therapy/Group: Individual Therapy  CarlyTawana Scale, DPT, NCS, CSRS 08/28/2022, 4:01 PM

## 2022-08-28 NOTE — Progress Notes (Signed)
Speech Language Pathology Daily Session Note  Patient Details  Name: [name] MRN: [id number] Date of Birth: 31-Oct-1956  {chl ip rehab slp time calculations:[id number]}  Short Term Goals: Week 4: SLP Short Term Goal 1 (Week 4): STG=LTG due to ELOS  Skilled Therapeutic Interventions: Skilled ST treatment focused on speech goals. SLP facilitated speech production tasks with two syllable words with 60% intelligibility (16/26 words) on first attempt, progressing to 80% with self correction and following multiple attempts with min A verbal cues for implementation of increased breath support, separation of syllables, over-exaggeration, and consuming sips of water to moisten throat at the min A level. Pt utilized combination of verbal communication and gestures to communicate needs without need for written communication this date. Pt is mod I for use of multimodal communication to [employer] during times of decreased intelligibility. SLP assisted pt to bathroom with min A to complete squat pivot transfer using grab bars in bathroom, and total A for peri care. Pt voided bladder. Patient was left in wheelchair with alarm activated and immediate needs within reach at end of session. Continue per current plan of care.      Pain  None  Therapy/Group: Individual Therapy  Patty Sermons 08/28/2022, 9:08 PM

## 2022-08-28 NOTE — Progress Notes (Signed)
Occupational Therapy Session Note  Patient Details  Name: Kara Bailey MRN: [id number] Date of Birth: 08-Nov-1956  Today's Date: 08/28/2022 OT Individual Time: 8756-4332 OT Individual Time Calculation (min): 40 min    Short Term Goals: Week 4:  OT Short Term Goal 1 (Week 4): STGs = LTGs  Skilled Therapeutic Interventions/Progress Updates:  Pt greeted seated in w/c finishing lunch with pts boyfriend, Thayer Jew present. Pt continuing to decline therapy participation but Thayer Jew with questions about DC. Reiterated that at this time no SNF decisions have been made but they are looking at beds and then waiting for insurance approval, pt continues to be tearful about this decision but by the end of session but states "I'll go to nursing home." Session focus on answering Cheron Schaumann questions as Thayer Jew asking why she can't return home with his assistance form stedy level. Education provided that at one time daughter was going to obtain a stedy and to our knowledge this never happened, education provided on financial cost of stedy and wanting to ensure that family could afford stedy before even initiating training with stedy, in addition to this Thayer Jew is the only caregiver in the home who is in a [place], unsure of his physical ability but did provide education that at  This point pt requires MOD A for transfers. Thayer Jew did not confirm whether or not he felt like he could provide the 50% of physical assist that she would need. Thayer Jew asking about [facility] therapy, explained that [facility] is only a couple days a week for 30 mins to an hour and would not provide the amount of therapy/assist that pt would need. Left session in agreement that at this time we could set our efforts on waiting to hear from SNF and then proceed as necessary.  Pt left seated in w/c with alarm belt activated and all needs within reach.  Therapy Documentation Precautions:  Precautions Precautions: Fall Precaution Comments: L hemi. expressive  aphasia Restrictions Weight Bearing Restrictions: No  Pain: No pain    Therapy/Group: Individual Therapy  Precious Haws 08/28/2022, 3:55 PM

## 2022-08-28 NOTE — Progress Notes (Addendum)
Patient ID: Kara Bailey, female   DOB: [birth year], 66 y.o.   MRN: [id number]  Spoke with son-Ricky who reports the family would like [facility] if can make a preference have two offers via [place] and [employer]. Will reach out to [facility] to see if they can offer a bed. Aware her insurance will need to approve this also. MD discussed with pt the need for more rehab if can get coverage for it.  12:32 PM Received bed ofer via [place] the pt's preference will work on [employer] [facility]. Message left for Ricky-son regarding this. Will begin insurance auth  1:41 PM have faxed clinicals to [facility] ref number [id number]. Also reached out to admissions at [facility] to inform and she will try on her end to get auth. Hopefully can be approved by tomorrow and pt can transfer tomorrow.

## 2022-08-28 NOTE — Progress Notes (Signed)
PROGRESS NOTE   Subjective/Complaints:  According to family patient does not have 24-hour care.  They are pursuing nursing home placement.  I would think that would be appropriate given her level of care.  She does live with her boyfriend who is in a motorized wheelchair.  The daughter can stay with her during the day in her place but could not stay with her at night  ROS:  Denies SOB,cough,chest pain, no N/V. Limited d/t severe dysarthria   Objective:   No results found. Recent Labs    08/27/22 0559  WBC 7.6  HGB 10.2*  HCT 31.8*  PLT 326     Recent Labs    08/27/22 0559  NA 142  K 3.8  CL 107  CO2 26  GLUCOSE 103*  BUN 13  CREATININE 0.97  CALCIUM 10.0      Intake/Output Summary (Last 24 hours) at 08/28/2022 0848 Last data filed at 08/27/2022 1820 Gross per 24 hour  Intake 413 ml  Output --  Net 413 ml         Physical Exam: Vital Signs Blood pressure 123/67, pulse 91, temperature 97.7 F (36.5 C), resp. rate 16, weight 59.4 kg, SpO2 100 %.   General: No acute distress Mood and affect are appropriate Heart: Regular rate and rhythm no rubs murmurs or extra sounds Lungs: Clear to auscultation, breathing unlabored, no rales or wheezes Abdomen: Positive bowel sounds, soft nontender to palpation, nondistended Extremities: No clubbing, cyanosis, or edema Skin: No evidence of breakdown, no evidence of rash  PE from prior encounter:  motor strength is 5/5 in right , 0/5 left deltoid, bicep, tricep, grip, hip flexor, knee extensors, ankle dorsiflexor and plantar flexor Trace Left hip/knee ext synergy  Sensory exam pt reports some sensation but impaired LUE>LLE Musculoskeletal: Full range of motion in all 4 extremities. No joint swelling or tenderness noted   Assessment/Plan: 1. Functional deficits which require 3+ hours per day of interdisciplinary therapy in a comprehensive inpatient rehab  setting. Physiatrist is providing close team supervision and 24 hour management of active medical problems listed below. Physiatrist and rehab team continue to assess barriers to discharge/monitor patient progress toward functional and medical goals  Care Tool:  Bathing    Body parts bathed by patient: Abdomen, Chest, Front perineal area, Right upper leg, Left upper leg, Left arm, Face   Body parts bathed by helper: Right arm, Buttocks, Left lower leg, Right lower leg     Bathing assist Assist Level: Moderate Assistance - Patient 50 - 74%     Upper Body Dressing/Undressing Upper body dressing   What is the patient wearing?: Bra, Pull over shirt    Upper body assist Assist Level: Minimal Assistance - Patient > 75%    Lower Body Dressing/Undressing Lower body dressing      What is the patient wearing?: Pants, Incontinence brief     Lower body assist Assist for lower body dressing: Moderate Assistance - Patient 50 - 74%     Toileting Toileting    Toileting assist Assist for toileting: Maximal Assistance - Patient 25 - 49%     Transfers Chair/bed transfer  Transfers assist  Chair/bed transfer  activity did not occur: Safety/medical concerns  Chair/bed transfer assist level: Minimal Assistance - Patient > 75%     Locomotion Ambulation   Ambulation assist   Ambulation activity did not occur: Safety/medical concerns  Assist level: Maximal Assistance - Patient 25 - 49% Assistive device: Walker-hemi Max distance: 65 ft   Walk 10 feet activity   Assist  Walk 10 feet activity did not occur: Safety/medical concerns  Assist level: Maximal Assistance - Patient 25 - 49% Assistive device: Walker-hemi   Walk 50 feet activity   Assist Walk 50 feet with 2 turns activity did not occur: Safety/medical concerns  Assist level: Maximal Assistance - Patient 25 - 49% Assistive device: Walker-hemi    Walk 150 feet activity   Assist Walk 150 feet activity did not  occur: Safety/medical concerns         Walk 10 feet on uneven surface  activity   Assist Walk 10 feet on uneven surfaces activity did not occur: Safety/medical concerns         Wheelchair     Assist Is the patient using a wheelchair?: Yes Type of Wheelchair: Manual    Wheelchair assist level: Supervision/Verbal cueing Max wheelchair distance: 150    Wheelchair 50 feet with 2 turns activity    Assist        Assist Level: Supervision/Verbal cueing   Wheelchair 150 feet activity     Assist      Assist Level: Supervision/Verbal cueing   Blood pressure 123/67, pulse 91, temperature 97.7 F (36.5 C), resp. rate 16, weight 59.4 kg, SpO2 100 %.  Medical Problem List and Plan: 1. 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  FL 2 form send according to social work has 2 offers Development worker, community.             -Continue CIR therapies including PT, OT, and SLP   2.  Antithrombotics: -DVT/anticoagulation:  Pharmaceutical: cont Lovenox , doppler without evidence of DVT              -antiplatelet therapy: continue Plavix and aspirin as PTA 3. Pain Management: tylenol as needed 4. Depression/insomnia: LCSW to evaluate and provide emotional support             -antipsychotic agents: n/a             -bupropion ER 150 mg q AM             -continue mirtazapine 30 mg q HS             -continue alprazolam 0.5 mg daily prn anxiety  -10/7 start melatonin PRN 5. Neuropsych/cognition: This patient is capable of making decisions on her own behalf. 6. Skin/Wound Care: routine skin care checks 7. Dysphagia: routine Is and Os and follow-up chemistries             continue Dysphagia 2 diet/thin-SLP Need to enc  fluids    9: Hypertension: continue to monitor;  avoid hypotension   Vitals:   08/27/22 2022 08/28/22 0334  BP: 130/77 123/67  Pulse: 92 91  Resp: 16 16  Temp: 97.7 F (36.5 C) 97.7 F (36.5 C)  SpO2: 100% 100%  10/17 BP normal cont Cardizem CD '240mg'$  per day  10: Hyperlipidemia: LDL = 125, counsel regarding healthy diet             -continue  ezetimibe 10 mg daily             -continue atorvastatin 90 mg daily 11: Tobacco use: cessation counseling             -continue nicotine patch 21 mg 12. Constipation           -LBM 10/12 medium , type 2 - increase Senna S to BID , continue BID miralax 13. GERD             -was not taking PPI at home, was taking famotidine qhs  14.  Dysarthria improving continue SLP  -On dys 2 diet with thin liquids  15. Insomnia- remeron '30mg'$  qhs      LOS: 27 days A FACE TO FACE EVALUATION WAS PERFORMED  Charlett Blake 08/28/2022, 8:48 AM

## 2022-08-29 NOTE — Patient Care Conference (Signed)
Inpatient RehabilitationTeam Conference and Plan of Care Update Date: 08/29/2022   Time: 10:05 AM    Patient Name: Kara Bailey      Medical Record Number: [id number]  Date of Birth: 24-Dec-1955 Sex: Female         Room/Bed: 4W11C/4W11C-01 Payor Info: Payor: HUMANA MEDICARE / Plan: HUMANA MEDICARE HMO / Product Type: *No Product type* /    Admit Date/Time:  08/01/2022  6:21 PM  Primary Diagnosis:  Ischemic stroke [facility])  Hospital Problems: Principal Problem:   Ischemic stroke [facility])    Expected Discharge Date: Expected Discharge Date:  (SNF pending)  Team Members Present: Physician leading conference: Dr. Alysia Penna Social Worker Present: Erlene Quan, BSW Nurse Present: Dorien Chihuahua, RN PT Present: Barrie Folk, PT OT Present: Meriel Pica, OT SLP Present: Sherren Kerns, SLP PPS Coordinator present : Gunnar Fusi, SLP     Current Status/Progress Goal Weekly Team Focus  Bowel/Bladder   Continent of bowel and incontinent of bladder.  Toilet q2 hours while awake and as needed.  Remain continent of bowel and regain continence of bladder.   Swallow/Nutrition/ Hydration   dysphagia 2 diet, thin liquids sup A  sup A  tolerance of current diet   ADL's   UB bathing and dressing supervision to min A using sing hemi strategies, LB bathing min A, LB dressing mod A, toilet transfer squat pivot min A, hypertone in LUe with no active return  minimal assist except for LTG of LB dressing and toileting  mod A  ADL retraing, LUE NMR and estim, postural control, balance, functional mobility, pt and fam education   Mobility             Communication   min A, mod I-to-sup A multimodal communication  mod I multimodal communication; min-to-mod A for speech strategies at the word level  carry over of speech intelligibility strategies, breath support   Safety/Cognition/ Behavioral Observations            Pain   Patient denies pain.  Patient rates pain less than or equal to 2  out of 10.  Assess pain q shift and as needed.   Skin   Skin is intact.  Skin remains intact.  Assess skin q shift and prn.     Discharge Planning:  Patient discharging plan updated to potentially SNF   Team Discussion: Patient very emotional and easily distracted.  Function limited by hyper-tone in arm and poor attendance to posture. Note active extension to hip but activity is at a slower speed and affected by poor trunchal positioning.  Patient on target to meet rehab goals: Yes, needs min assist for cognition, communication    *See Care Plan and progress notes for long and short-term goals.   Revisions to Treatment Plan:  Light gait AFO consult   Teaching Needs: Safety, medications, dietary modifications, transfers, toileting, etc.   Current Barriers to Discharge: Decreased caregiver support and Home enviroment access/layout  Possible Resolutions to Barriers: SNF placement recommended     Medical Summary Current Status: Left shoulder pain increasing spasticity left pectoralis and left biceps  Barriers to Discharge: Medical stability   Possible Resolutions to [employer] Focus: Family requesting nursing home placement awaiting insurance authorization.  Patient would benefit from additional therapy on a more intensive basis in a SNF versus home health setting, medically stable for hospital discharge   Continued Need for Acute Rehabilitation Level of Care: The patient requires daily medical management by a physician with specialized training in  physical medicine and rehabilitation for the following reasons: Direction of a multidisciplinary physical rehabilitation program to maximize functional independence : Yes Medical management of patient stability for increased activity during participation in an intensive rehabilitation regime.: Yes Analysis of laboratory values and/or radiology reports with any subsequent need for medication adjustment and/or medical intervention. :  Yes   I attest that I was present, lead the team conference, and concur with the assessment and plan of the team.   Dorien Chihuahua B 08/29/2022, 1:05 PM

## 2022-08-29 NOTE — Progress Notes (Signed)
Physical Therapy Session Note  Patient Details  Name: TRINIDY MASTERSON MRN: [id number] Date of Birth: [birth year]  Today's Date: 08/29/2022 PT Individual Time: 1035-1103 PT Individual Time Calculation (min): 28 min   Short Term Goals:  Week 3:  PT Short Term Goal 1 (Week 3): Pt will transfer to and from [facility] with min assist PT Short Term Goal 2 (Week 3): Pt wil ambulate 64f with max A +2. PT Short Term Goal 3 (Week 3): Pt will propell WC 151fwith CGA PT Short Term Goal 4 (Week 3): Pt will perform 5xSTS to assess fall risk   Skilled Therapeutic Interventions/Progress Updates:        Therapy Documentation Precautions:  Precautions Precautions: Fall Precaution Comments: L hemi. expressive aphasia Restrictions Weight Bearing Restrictions: No General:   Vital Signs: Therapy Vitals Temp: (!) 97.3 F (36.3 C) Temp Source: Oral Pulse Rate: 94 Resp: 16 BP: 137/75 Patient Position (if appropriate): Sitting Oxygen Therapy SpO2: 99 % O2 Device: Room Air Pain: Pain Assessment Pain Score: 0-No pain Mobility:   Locomotion :    Trunk/Postural Assessment :    Balance:   Exercises:   Other Treatments:      Therapy/Group: Individual Therapy  AuLorie Phenix0/18/2023, 1:14 PM

## 2022-08-29 NOTE — Progress Notes (Signed)
PROGRESS NOTE   Subjective/Complaints:  Pt worried about d/c plan  According to family patient does not have 24-hour care.  They are pursuing nursing home placement.  I would think that would be appropriate given her level of care.  She does live with her boyfriend who is in a motorized wheelchair.  The daughter can stay with her during the day in her place but could not stay with her at night  ROS:  Denies SOB,cough,chest pain, no N/V. Limited d/t severe dysarthria  Having Left shoulder pain with movement Objective:   No results found. Recent Labs    08/27/22 0559  WBC 7.6  HGB 10.2*  HCT 31.8*  PLT 326     Recent Labs    08/27/22 0559  NA 142  K 3.8  CL 107  CO2 26  GLUCOSE 103*  BUN 13  CREATININE 0.97  CALCIUM 10.0      Intake/Output Summary (Last 24 hours) at 08/29/2022 0847 Last data filed at 08/29/2022 0837 Gross per 24 hour  Intake 694 ml  Output --  Net 694 ml         Physical Exam: Vital Signs Blood pressure 122/65, pulse 91, temperature 97.8 F (36.6 C), temperature source Oral, resp. rate 17, weight 59.4 kg, SpO2 100 %.   General: No acute distress Mood and affect are appropriate Heart: Regular rate and rhythm no rubs murmurs or extra sounds Lungs: Clear to auscultation, breathing unlabored, no rales or wheezes Abdomen: Positive bowel sounds, soft nontender to palpation, nondistended Extremities: No clubbing, cyanosis, or edema Skin: No evidence of breakdown, no evidence of rash  PE from prior encounter:  motor strength is 5/5 in right , 0/5 left deltoid, bicep, tricep, grip, hip flexor, knee extensors, ankle dorsiflexor and plantar flexor Trace Left hip/knee ext synergy  Tone MAS 3 in left pec and biceps Sensory exam pt reports some sensation but impaired LUE>LLE Musculoskeletal: pain with Left shoulder ER and abd    No joint swelling or tenderness noted   Assessment/Plan: 1.  Functional deficits which require 3+ hours per day of interdisciplinary therapy in a comprehensive inpatient rehab setting. Physiatrist is providing close team supervision and 24 hour management of active medical problems listed below. Physiatrist and rehab team continue to assess barriers to discharge/monitor patient progress toward functional and medical goals  Care Tool:  Bathing    Body parts bathed by patient: Abdomen, Chest, Front perineal area, Right upper leg, Left upper leg, Left arm, Face   Body parts bathed by helper: Right arm, Buttocks, Left lower leg, Right lower leg     Bathing assist Assist Level: Moderate Assistance - Patient 50 - 74%     Upper Body Dressing/Undressing Upper body dressing   What is the patient wearing?: Bra, Pull over shirt    Upper body assist Assist Level: Minimal Assistance - Patient > 75%    Lower Body Dressing/Undressing Lower body dressing      What is the patient wearing?: Pants, Incontinence brief     Lower body assist Assist for lower body dressing: Moderate Assistance - Patient 50 - 74%     Chartered loss adjuster  assist Assist for toileting: Maximal Assistance - Patient 25 - 49%     Transfers Chair/bed transfer  Transfers assist  Chair/bed transfer activity did not occur: Safety/medical concerns  Chair/bed transfer assist level: Minimal Assistance - Patient > 75%     Locomotion Ambulation   Ambulation assist   Ambulation activity did not occur: Safety/medical concerns  Assist level: Maximal Assistance - Patient 25 - 49% Assistive device: Walker-hemi Max distance: 65 ft   Walk 10 feet activity   Assist  Walk 10 feet activity did not occur: Safety/medical concerns  Assist level: Maximal Assistance - Patient 25 - 49% Assistive device: Walker-hemi   Walk 50 feet activity   Assist Walk 50 feet with 2 turns activity did not occur: Safety/medical concerns  Assist level: Maximal Assistance - Patient  25 - 49% Assistive device: Walker-hemi    Walk 150 feet activity   Assist Walk 150 feet activity did not occur: Safety/medical concerns         Walk 10 feet on uneven surface  activity   Assist Walk 10 feet on uneven surfaces activity did not occur: Safety/medical concerns         Wheelchair     Assist Is the patient using a wheelchair?: Yes Type of Wheelchair: Manual    Wheelchair assist level: Supervision/Verbal cueing Max wheelchair distance: 150    Wheelchair 50 feet with 2 turns activity    Assist        Assist Level: Supervision/Verbal cueing   Wheelchair 150 feet activity     Assist      Assist Level: Supervision/Verbal cueing   Blood pressure 122/65, pulse 91, temperature 97.8 F (36.6 C), temperature source Oral, resp. rate 17, weight 59.4 kg, SpO2 100 %.  Medical Problem List and Plan: 1. 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  FL 2 form send according to social work has 2 offers Development worker, community.             -Continue CIR therapies including PT, OT, and SLP   2.  Antithrombotics: -DVT/anticoagulation:  Pharmaceutical: cont Lovenox , doppler without evidence of DVT              -antiplatelet therapy: continue Plavix and aspirin as PTA 3. Pain Management: tylenol as needed 4. Depression/insomnia: LCSW to evaluate and provide emotional support             -antipsychotic agents: n/a             -bupropion ER 150 mg q AM             -continue mirtazapine 30 mg q HS             -continue alprazolam 0.5 mg daily prn anxiety  -10/7 start melatonin PRN 5. Neuropsych/cognition: This patient is capable of making decisions on her own behalf. 6. Skin/Wound Care: routine skin  care checks 7. Dysphagia: routine Is and Os and follow-up chemistries             continue Dysphagia 2 diet/thin-SLP Need to enc fluids    9: Hypertension: continue to monitor;  avoid hypotension   Vitals:   08/28/22 1948 08/29/22  0440  BP: (!) 148/79 122/65  Pulse: (!) 103 91  Resp: 18 17  Temp: 97.8 F (36.6 C) 97.8 F (36.6 C)  SpO2: 100% 100%   10/18 BP normal cont Cardizem CD '240mg'$  per day  10: Hyperlipidemia: LDL = 125, counsel regarding healthy diet             -continue  ezetimibe 10 mg daily             -continue atorvastatin 90 mg daily 11: Tobacco use: cessation counseling             -continue nicotine patch 21 mg 12. Constipation           -LBM 10/12 medium , type 2 - increase Senna S to BID , continue BID miralax 13. GERD             -was not taking PPI at home, was taking famotidine qhs  14.  Dysarthria improving continue SLP  -On dys 2 diet with thin liquids  15. Insomnia- remeron '30mg'$  qhs      LOS: 28 days A FACE TO FACE EVALUATION WAS PERFORMED  Charlett Blake 08/29/2022, 8:47 AM

## 2022-08-29 NOTE — Progress Notes (Addendum)
Patient ID: Kara Bailey, female   DOB: 1956/10/20, 66 y.o.   MRN: [id number]  Team Conference Report to Patient/Family  Team Conference discussion was reviewed with the patient and caregiver, including goals, any changes in plan of care and target discharge date.  Patient and caregiver express understanding and are in agreement.  The patient has a target discharge date of  (SNF pending).  SW spoke with patient son, Kara Bailey and informed patient son if patient receive authorization patient discharge will be between today and tomorrow. Son informed if authorization not approved we will have to plan for a discharge home. If patient does not receive authorization patient son requesting medical transport home.   Discharge address:   [place], [place] [address]  [name] J Mackinze Criado 08/29/2022, 1:16 PM

## 2022-08-29 NOTE — Progress Notes (Signed)
Speech Language Pathology Daily Session Note  Patient Details  Name: Kara Bailey MRN: [id number] Date of Birth: 1956-03-02  Today's Date: 08/29/2022 SLP Individual Time: 1455-1540 SLP Individual Time Calculation (min): 45 min  Short Term Goals: Week 4: SLP Short Term Goal 1 (Week 4): STG=LTG due to ELOS  Skilled Therapeutic Interventions: Skilled ST treatment focused on speech goals. In the event pt were to return home without 24 hour support, SLP facilitated use of multimodal communication with sup A verbal cues fading to mod I for use of phone in regards to texting, making outgoing calls, and navigating home screen. Pt was able to locate both daughter and sister's number in phone and type out practice text "I need help" at mod I level. Pt required intermittent physical assist to manipulate phone due to utilizing with only one hand however eventually faded to mod I level with extended time. SLP facilitated use of breath support techniques to increase vocal intensity, as well as pausing between syllables with overall sup A verbal cues for consistency. Pt continues to be limited by significantly reduced vocal intensity and breat support. Pt still exhibiting minimal ability to generate enough suction in order to use IMT device. Pt is feeling optimistic for further recovery, although disappointed in the possibility delaying discharge home for further rehab at SNF level of care pending insurance approval. Pt is grateful the bed offer was made at a place she is familiar with and approves of. Patient was left in bed with alarm activated and immediate needs within reach at end of session. Will continue to follow pending any updates with discharge.     Pain Pain Assessment Pain Score: 0-No pain  Therapy/Group: Individual Therapy  Patty Sermons 08/29/2022, 4:27 PM

## 2022-08-29 NOTE — Progress Notes (Signed)
Occupational Therapy Session Note  Patient Details  Name: Kara Bailey MRN: [id number] Date of Birth: 1956-02-23  Today's Date: 08/29/2022 OT Individual Time: 0920-1000 OT Individual Time Calculation (min): 40 min    Short Term Goals: Week 4:  OT Short Term Goal 1 (Week 4): STGs = LTGs  Skilled Therapeutic Interventions/Progress Updates:    Pt seen this session to engage in ADL tasks.  Pt declined a shower or need to use restroom as she stated she had already urinated in her brief.  Pt sat to EOB with CGA, min A to stand with hemiwalker to have brief doffed. Worked on standing to wash her bottom.  Initially pt needed increased assist and worked on R weight shift progressing to Hasty. Pt was even able to release hand to help pull up pants with min A for balance.   Pt completed dressing with min A and did an excellent CGA to transfer to w/c with squat pivot. Pt resting in wc with all needs met.     Therapy Documentation Precautions:  Precautions Precautions: Fall Precaution Comments: L hemi. expressive aphasia Restrictions Weight Bearing Restrictions: No Therapy Vitals Temp: (Abnormal) 97.3 F (36.3 C) Temp Source: Oral Pulse Rate: 94 Resp: 16 BP: 137/75 Patient Position (if appropriate): Sitting Oxygen Therapy SpO2: 99 % O2 Device: Room Air Pain: Pain Assessment Pain Score: 0-No pain ADL: ADL Eating: Set up Grooming: Supervision/safety Where Assessed-Grooming: Sitting at sink Upper Body Bathing: Supervision/safety, Minimal cueing (using a long sponge to reach RUE) Where Assessed-Upper Body Bathing: Shower Lower Body Bathing: Minimal assistance (A to wash bottom) Where Assessed-Lower Body Bathing: Shower Upper Body Dressing: Minimal assistance Where Assessed-Upper Body Dressing: Wheelchair Lower Body Dressing:  (dressing from wc level, standing with R hand on bar for therapist to pull pants over hips) Where Assessed-Lower Body Dressing: Wheelchair Toileting: Maximal  assistance Where Assessed-Toileting: Glass blower/designer: Moderate assistance Toilet Transfer Method: Squat pivot Toilet Transfer Equipment: Grab bars Tub/Shower Transfer: Maximal assistance, Moderate assistance Tub/Shower Transfer Method: Sit pivot Tub/Shower Equipment: Facilities manager: Environmental education officer Method: Education officer, environmental: Radio broadcast assistant, Grab bars ADL Comments: Pt able to recall hemi-techniques with grooming tasks learned in previous OT session.      Therapy/Group: Individual Therapy  [place] 08/29/2022, 1:08 PM

## 2022-08-30 MED ORDER — FAMOTIDINE 20 MG PO TABS
40.0000 mg | ORAL_TABLET | Freq: Every day | ORAL | Status: DC
  Administered 2022-08-30 – 2022-09-06 (×8): 40 mg via ORAL
  Filled 2022-08-30 (×8): qty 2

## 2022-08-30 NOTE — Progress Notes (Signed)
Speech Language Pathology Weekly Progress and Session Note  Patient Details  Name: Kara Bailey MRN: [id number] Date of Birth: [birth year]  Beginning of progress report period: August 24, 2022 End of progress report period: August 30, 2022  Today's Date: 08/30/2022 SLP Individual Time: 1330-1415 SLP Individual Time Calculation (min): 45 min  Short Term Goals: Week 4: SLP Short Term Goal 1 (Week 4): STG=LTG due to ELOS SLP Short Term Goal 1 - Progress (Week 4): Progressing toward goal  New Short Term Goals: Week 5: SLP Short Term Goal 1 (Week 5): STG=LTG due to ELOS  Weekly Progress Updates: Pt continues to progress with speech goals by communicating functional needs via multimodal means with mod I and extended time; typically requiring mod fading to min A verbal cues for implementation of speech and breath support strategies at the word and short sentence level. Breath support for speech remains significantly impaired. Pt continues to consume dysphagia 2 textures and thin liquids and is mod I for safe swallowing strategies. Severity of oral deficits have limited pt's ability to advance diet textures. SLP completed brief education with pt's daughter as well as pt's sister, however education was limited due to family not showing up for additional sessions as scheduled. Pt's discharge disposition is currently to be determined, as family wished to pursue SNF for ongoing therapy services. Placement currently pending insurance approval. If discharging home, pt is highly recommended to have 24 hour supervision as well as follow up SLP services.   Intensity: Minumum of 1-2 x/day, 30 to 90 minutes Frequency: 3 to 5 out of 7 days Duration/Length of Stay: TBD - SNF pending Treatment/Interventions: Dysphagia/aspiration precaution training;Speech/Language facilitation;Oral motor exercises;Therapeutic Activities;Functional tasks;Patient/family education;Multimodal communication approach  Daily  Session Skilled Therapeutic Interventions: Skilled ST treatment focused on speech goals. Pt was greeted upright in wheelchair on arrival. Pt was seen consuming dysphagia 2 texture snack with prolonged mastication, anterior spillage attributed to decreased labial seal further exacerbated by tongue thrust with swallow. This also occurred with thin liquid cup sips. Pt exhibited no overt s/sx of aspiration with dys 2 textures/thin liquids. Pt is recommended to continue with current diet. SLP facilitated speech intelligibility goals with verbal description task using items in hospital vending machine. Pt implemented breath support techniques in addition to pausing between syllables, over articulation, and increased vocal intensity with mod fading to min A verbal cues for production at the word and phrase level. Pt with improved carry over of skill during structured tasks with inconsistent carry over during casual discussion requiring frequent verbal/visual cues to increase breath support for speech. Patient was left in wheelchair with alarm activated and immediate needs within reach at end of session. Continue per current plan of care.      General    Pain  None/denied  Therapy/Group: Individual Therapy  Patty Sermons 08/30/2022, 4:27 PM

## 2022-08-30 NOTE — Progress Notes (Signed)
Occupational Therapy Session Note  Patient Details  Name: Kara Bailey MRN: [id number] Date of Birth: [birth year]  Today's Date: 08/30/2022 OT Individual Time: 4801-6553 OT Individual Time Calculation (min): 45 min    Short Term Goals: Week 4:  OT Short Term Goal 1 (Week 4): STGs = LTGs  Skilled Therapeutic Interventions/Progress Updates:  Pt seen for skilled OT session this am. OT collaboration with team for d/c plan and still pending disposition. Pt was able to convey she required a dry brief d/t urine incontinence and had not performed sponge bathing, dressing or oral care this am thus far. OT focus on sink side seated and standing level ADL performance with emphasis on L hemi techniques and safe standing balance as needed with sink support and R sided hemi walker support. Pt required increased time to doff pullover shirt and stood with min A while OT assisted to remove brief and manage LB clothing. UB sponge bathing with set up and LB with min-mod A excluding feet. Limited postural control with sit to and from stand and dynamic balance requiring assist with LB clothing management. Seated level grooming with set up for oral care and mod cues to utilize L UE into task as stabilizer d/t limited to no grasp/release demonstrated this sesison. Once self care comp;eted, pt  albe to tolerate PNF diagonals following scap retraction and sh elevation with L UE. Left up in w/c with L UE in arm trough, chair exit alarm set, nurse call button and needs all within reach. No pain reported during this session.    Therapy Documentation Precautions:  Precautions Precautions: Fall Precaution Comments: L hemi. expressive aphasia Restrictions Weight Bearing Restrictions: No    Therapy/Group: Individual Therapy  Barnabas Lister 08/30/2022, 7:46 AM

## 2022-08-30 NOTE — Progress Notes (Signed)
PROGRESS NOTE   Subjective/Complaints:  Discussed LE strength and gait with PT, pt sits mod I EOB  ROS:  Denies SOB,cough,chest pain, no N/V. Limited d/t severe dysarthria   Objective:   No results found. No results for input(s): "WBC", "HGB", "HCT", "PLT" in the last 72 hours.   No results for input(s): "NA", "K", "CL", "CO2", "GLUCOSE", "BUN", "CREATININE", "CALCIUM" in the last 72 hours.    Intake/Output Summary (Last 24 hours) at 08/30/2022 0825 Last data filed at 08/29/2022 1839 Gross per 24 hour  Intake 480 ml  Output --  Net 480 ml         Physical Exam: Vital Signs Blood pressure 127/67, pulse 95, temperature (!) 97.5 F (36.4 C), temperature source Oral, resp. rate 16, weight 59.4 kg, SpO2 100 %.   General: No acute distress Mood and affect are appropriate Heart: Regular rate and rhythm no rubs murmurs or extra sounds Lungs: Clear to auscultation, breathing unlabored, no rales or wheezes Abdomen: Positive bowel sounds, soft nontender to palpation, nondistended Extremities: No clubbing, cyanosis, or edema Skin: No evidence of breakdown, no evidence of rash  PE from prior encounter:  motor strength is 5/5 in right , 0/5 left deltoid, bicep, tricep, grip, hip flexor, knee extensors, ankle dorsiflexor and plantar flexor Trace Left hip/knee ext synergy  Tone MAS 3 in left pec and biceps Sensory exam pt reports some sensation but impaired LUE>LLE Musculoskeletal: pain with Left shoulder ER and abd    No joint swelling or tenderness noted   Assessment/Plan: 1. Functional deficits which require 3+ hours per day of interdisciplinary therapy in a comprehensive inpatient rehab setting. Physiatrist is providing close team supervision and 24 hour management of active medical problems listed below. Physiatrist and rehab team continue to assess barriers to discharge/monitor patient progress toward functional and  medical goals  Care Tool:  Bathing    Body parts bathed by patient: Abdomen, Chest, Front perineal area, Right upper leg, Left upper leg, Left arm, Face   Body parts bathed by helper: Right arm, Buttocks, Left lower leg, Right lower leg     Bathing assist Assist Level: Moderate Assistance - Patient 50 - 74%     Upper Body Dressing/Undressing Upper body dressing   What is the patient wearing?: Bra, Pull over shirt    Upper body assist Assist Level: Minimal Assistance - Patient > 75%    Lower Body Dressing/Undressing Lower body dressing      What is the patient wearing?: Pants, Incontinence brief     Lower body assist Assist for lower body dressing: Moderate Assistance - Patient 50 - 74%     Toileting Toileting    Toileting assist Assist for toileting: Maximal Assistance - Patient 25 - 49%     Transfers Chair/bed transfer  Transfers assist  Chair/bed transfer activity did not occur: Safety/medical concerns  Chair/bed transfer assist level: Minimal Assistance - Patient > 75%     Locomotion Ambulation   Ambulation assist   Ambulation activity did not occur: Safety/medical concerns  Assist level: Maximal Assistance - Patient 25 - 49% Assistive device: Walker-hemi Max distance: 65 ft   Walk 10 feet activity  Assist  Walk 10 feet activity did not occur: Safety/medical concerns  Assist level: Maximal Assistance - Patient 25 - 49% Assistive device: Walker-hemi   Walk 50 feet activity   Assist Walk 50 feet with 2 turns activity did not occur: Safety/medical concerns  Assist level: Maximal Assistance - Patient 25 - 49% Assistive device: Walker-hemi    Walk 150 feet activity   Assist Walk 150 feet activity did not occur: Safety/medical concerns         Walk 10 feet on uneven surface  activity   Assist Walk 10 feet on uneven surfaces activity did not occur: Safety/medical concerns         Wheelchair     Assist Is the patient using  a wheelchair?: Yes Type of Wheelchair: Manual    Wheelchair assist level: Supervision/Verbal cueing Max wheelchair distance: 150    Wheelchair 50 feet with 2 turns activity    Assist        Assist Level: Supervision/Verbal cueing   Wheelchair 150 feet activity     Assist      Assist Level: Supervision/Verbal cueing   Blood pressure 127/67, pulse 95, temperature (!) 97.5 F (36.4 C), temperature source Oral, resp. rate 16, weight 59.4 kg, SpO2 100 %.  Medical Problem List and Plan: 1. 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  FL 2 form send according to social work has 2 offers Copy updates from ins/SW             -Continue CIR therapies including PT, OT, and SLP   2.  Antithrombotics: -DVT/anticoagulation:  Pharmaceutical: cont Lovenox , doppler without evidence of DVT              -antiplatelet therapy: continue Plavix and aspirin as PTA 3. Pain Management: tylenol as needed 4. Depression/insomnia: LCSW to evaluate and provide emotional support             -antipsychotic agents: n/a             -bupropion ER 150 mg q AM             -continue mirtazapine 30 mg q HS             -continue alprazolam 0.5 mg daily prn anxiety  -10/7 start melatonin PRN 5. Neuropsych/cognition: This patient is capable of making decisions on her own behalf. 6. Skin/Wound Care: routine skin care checks 7. Dysphagia: routine Is and Os and follow-up chemistries             continue Dysphagia 2 diet/thin-SLP Need to enc fluids    9: Hypertension: continue to monitor;  avoid hypotension   Vitals:   08/29/22 1935 08/30/22 0547  BP: 116/71 127/67  Pulse: (!) 104 95  Resp: 17 16  Temp: 97.8 F (36.6 C) (!)  97.5 F (36.4 C)  SpO2: 99% 100%   10/18 BP normal cont Cardizem CD '240mg'$  per day  10: Hyperlipidemia: LDL = 125, counsel regarding healthy diet             -continue  ezetimibe 10 mg daily             -continue  atorvastatin 90 mg daily 11: Tobacco use: cessation counseling             -continue nicotine patch 21 mg 12. Constipation           -LBM 10/12 medium , type 2 - increase Senna S to BID , continue BID miralax 13. GERD             -was not taking PPI at home, was taking famotidine qhs  14.  Dysarthria improving continue SLP  -On dys 2 diet with thin liquids  15. Insomnia- remeron '30mg'$  qhs      LOS: 29 days A FACE TO FACE EVALUATION WAS PERFORMED  Charlett Blake 08/30/2022, 8:25 AM

## 2022-08-30 NOTE — Progress Notes (Addendum)
Patient ID: Kara Bailey, female   DOB: 04/14/1956, 66 y.o.   MRN: [id number]  SW received phone call form Margarita Grizzle with [facility] requesting additional clinicals to provide to physician to make SNF determination. Clinicals faxed to Margarita Grizzle at [phone]

## 2022-08-30 NOTE — Progress Notes (Signed)
Physical Therapy Weekly Progress Note      Late entry   Patient Details  Name: Kara Bailey MRN: [id number] Date of Birth: August 27, 1956  Beginning of progress report period: August 17, 2022 End of progress report period: August 25, 2022  Today's Date: 08/25/2022      Patient has met 3 of 4 short term goals.  Pt is making steady progress towards LTG of min assist transfers and supervision assist WC mobility overall. Pt can perform slide board and squat pivot transfers with min assist, but intermittently requires mod assist due to poor trunk control and use of the LLE. Family has initiated training with daughter planning on providing assist for pt at home while working remotely.   Patient continues to demonstrate the following deficits muscle weakness, muscle joint tightness, and muscle paralysis, decreased cardiorespiratoy endurance, impaired timing and sequencing, abnormal tone, unbalanced muscle activation, motor apraxia, and decreased motor planning, decreased attention to left and decreased motor planning, and decreased sitting balance, decreased standing balance, decreased postural control, hemiplegia, and decreased balance strategies and therefore will continue to benefit from skilled PT intervention to increase functional independence with mobility.  Patient progressing toward long term goals..  Continue plan of care.  PT Short Term Goals Week 3:  PT Short Term Goal 1 (Week 3): Pt will transfer to and from [facility] with min assist PT Short Term Goal 1 - Progress (Week 3): Progressing toward goal PT Short Term Goal 2 (Week 3): Pt wil ambulate 68f with max A +2. PT Short Term Goal 2 - Progress (Week 3): Met PT Short Term Goal 3 (Week 3): Pt will propell WC 1586fwith CGA PT Short Term Goal 3 - Progress (Week 3): Met PT Short Term Goal 4 (Week 3): Pt will perform 5xSTS to assess fall risk PT Short Term Goal 4 - Progress (Week 3): Met Week 4:  PT Short Term Goal 1 (Week 4): STG=LTG due to  ELOS  Skilled Therapeutic Interventions/Progress Updates:  Ambulation/gait training;Discharge planning;Functional mobility training;Psychosocial support;Therapeutic Activities;Visual/perceptual remediation/compensation;Balance/vestibular training;Disease management/prevention;Neuromuscular re-education;Skin care/wound management;Wheelchair propulsion/positioning;Therapeutic Exercise;Cognitive remediation/compensation;DME/adaptive equipment instruction;Pain management;Splinting/orthotics;UE/LE Strength taining/ROM;Community reintegration;Functional electrical stimulation;Patient/family education;UE/LE Coordination activities;Stair training   Therapy Documentation Precautions:  Precautions Precautions: Fall Precaution Comments: L hemi. expressive aphasia Restrictions Weight Bearing Restrictions: No   Therapy/Group: Individual Therapy  AuLorie Phenix0/19/2023, 1:41 PM

## 2022-08-31 NOTE — Progress Notes (Signed)
Speech Language Pathology Daily Session Note  Patient Details  Name: Kara Bailey MRN: [id number] Date of Birth: 06/12/1956  Today's Date: 08/31/2022 SLP Individual Time: 1320-1335 and 830-900 SLP Individual Time Calculation (min): 15 min and 30 min  Short Term Goals: Week 5: SLP Short Term Goal 1 (Week 5): STG=LTG due to ELOS  Skilled Therapeutic Interventions: #1 Skilled ST services focused on swallow and speech goals. SLP facilitated vowel productions, pt demonstrated ability to sustain /a/ for two seconds increasing in vocal intensity following a sip of water and the remainder vowels /I/, /e/, /o/ and /u/ 1 second without Increase in vocal intensity. SLP also facilitated breath support and vocal intensity/production of 2 syllable words. Pt demonstrated 70% intelligibility and demonstrated appropriate breath support with max A verbal cues on 60-70% of the trials. Pt expressed dislike for crushed medication. SLP provided pureed texture trial, pt demonstrated 10 second swallow initiation delay and 3 seconds swallow initiation delay on thin liquids. SLP provided education that medication crushed in puree is still needed at this point due to impairments in oral function. Pt expressed understanding. Pt left with bed alarm set and call bell within reach. Recommend to continue ST services.  #2 Skilled ST services focused on speech skills. Pt demonstrated reduced breath support compared to am session. SLP facilitated x10 diaphragmic breathing exercises and encouraged pt to extend exhale. Pt expressed frustration with staff members not allowing pt time to communicate. SLP educated pt on the need for staff members to clarify communication every 2-3 words produced by pt. Pt required mo A verbal cues to clarify speech and demonstrated 50 increasing to 60% intelligibility. SLP communicated strategies with NT. Pt was left with call bell within reach and chair alarm set. Continue ST services.       Pain Pain Assessment Pain Score: 0-No pain  Therapy/Group: Individual Therapy  Emie Sommerfeld  [facility] 08/31/2022, 1:43 PM

## 2022-08-31 NOTE — Progress Notes (Signed)
Physical Therapy Session Note  Patient Details  Name: Kara Bailey MRN: [id number] Date of Birth: 1956-08-25  Today's Date: 08/31/2022 PT Individual Time: 1051-1130 and 1535-1630   39 min and 55 min   Short Term Goals: Week 1:  PT Short Term Goal 1 (Week 1): Pt will perform bed mobility with max assist of 1 consistently PT Short Term Goal 1 - Progress (Week 1): Met PT Short Term Goal 2 (Week 1): Pt will sit EOB with CGA >3 mintues PT Short Term Goal 2 - Progress (Week 1): Met PT Short Term Goal 3 (Week 1): Pt will propell WC >100with min assist PT Short Term Goal 3 - Progress (Week 1): Met PT Short Term Goal 4 (Week 1): Pt will tolerate standing >1 min with max assist from PT PT Short Term Goal 4 - Progress (Week 1): Met Week 2:  PT Short Term Goal 1 (Week 2): pt will perform bed mobiltiy with mod assist PT Short Term Goal 1 - Progress (Week 2): Met PT Short Term Goal 2 (Week 2): Pt will transfer to [facility] with mod assist PT Short Term Goal 2 - Progress (Week 2): Met PT Short Term Goal 3 (Week 2): Pt will propell WC 175f with min assist PT Short Term Goal 3 - Progress (Week 2): Met PT Short Term Goal 4 (Week 2): Pt will perform sit<>stand with mod assist consistently PT Short Term Goal 4 - Progress (Week 2): Met Week 3:  PT Short Term Goal 1 (Week 3): [facility] Short Term Goal 1 - Progress (Week 3): Progressing toward goal PT Short Term Goal 2 (Week 3): Pt wil ambulate 536fwith max A +2. PT Short Term Goal 2 - Progress (Week 3): Met PT Short Term Goal 3 (Week 3): Pt will propell WC 15023fith CGA PT Short Term Goal 3 - Progress (Week 3): Met PT Short Term Goal 4 (Week 3): Pt will perform 5xSTS to assess fall risk PT Short Term Goal 4 - Progress (Week 3): Met Week 4:  PT Short Term Goal 1 (Week 4): STG=LTG due to ELOS Week 5:     Skilled Therapeutic Interventions/Progress Updates:   Pt received sitting in WC and agreeable to PT.PT  assisted to don shoes and LAFO while pt sitting in WC. Pt [facility] rehab gym [facility] to nustep with mod assist and cues for head/hips relationship. Nustep reciprocal movement training 5 min +3 min with min assist throughout to initiation movement on the LLE. Resitantance form 1-3 and thigh support in place. Gait training with HW x 65f46fth max assist from PT and max cues for activation of the LLE in stance and adequate weight shift to the R to advance the LLE. Patient returned to room and [facility] in reach and all needs met.     Session 2.  Pt received sitting in WC and agreeable to PT. WC mobility x 150ft9fh supervision assist with hemitehcnique and cues for doorway navigation initially. Squat pivot transfer to bed with mod assist from PT to improve lift off WC. NMES to the L thigh L muscle activation x 10 min with AAROM from PT to allow full ROM on the LLE. Performed NMES to L thigh with trigger activation in stance on the LLE 4  botus x 5 steps with RLEW and x 5 in sit>stand from mat table. Patient returned to  room and left sitting in [facility] with call bell in reach and all needs met.          Therapy Documentation Precautions:  Precautions Precautions: Fall Precaution Comments: L hemi. expressive aphasia Restrictions Weight Bearing Restrictions: No  Vital Signs: Therapy Vitals Temp: 97.7 F (36.5 C) Temp Source: Oral Pulse Rate: 87 Resp: 16 BP: 129/89 Patient Position (if appropriate): Lying Oxygen Therapy SpO2: 100 % O2 Device: Room Air Pain: Denies in session 1 and 2    Therapy/Group: Individual Therapy  Lorie Phenix 08/31/2022, 7:52 AM

## 2022-08-31 NOTE — Progress Notes (Signed)
PROGRESS NOTE   Subjective/Complaints:  Still requiring Mod A for mobility and max for LE dressing, slow progress related to limited motor recovery thus far    ROS:  Denies SOB,cough,chest pain, no N/V. Limited d/t severe dysarthria   Objective:   No results found. No results for input(s): "WBC", "HGB", "HCT", "PLT" in the last 72 hours.   No results for input(s): "NA", "K", "CL", "CO2", "GLUCOSE", "BUN", "CREATININE", "CALCIUM" in the last 72 hours.    Intake/Output Summary (Last 24 hours) at 08/31/2022 0813 Last data filed at 08/30/2022 1857 Gross per 24 hour  Intake 720 ml  Output --  Net 720 ml         Physical Exam: Vital Signs Blood pressure 129/89, pulse 87, temperature 97.7 F (36.5 C), temperature source Oral, resp. rate 16, weight 59.4 kg, SpO2 100 %.   General: No acute distress Mood and affect are appropriate Heart: Regular rate and rhythm no rubs murmurs or extra sounds Lungs: Clear to auscultation, breathing unlabored, no rales or wheezes Abdomen: Positive bowel sounds, soft nontender to palpation, nondistended Extremities: No clubbing, cyanosis, or edema Skin: No evidence of breakdown, no evidence of rash  PE from prior encounter:  motor strength is 5/5 in right , 0/5 left deltoid, bicep, tricep, grip, hip flexor, knee extensors, ankle dorsiflexor and plantar flexor Trace Left hip/knee ext synergy  Tone MAS 3 in left pec and biceps Sensory exam pt reports some sensation but impaired LUE>LLE Musculoskeletal: pain with Left shoulder ER and abd    No joint swelling or tenderness noted   Assessment/Plan: 1. Functional deficits which require 3+ hours per day of interdisciplinary therapy in a comprehensive inpatient rehab setting. Physiatrist is providing close team supervision and 24 hour management of active medical problems listed below. Physiatrist and rehab team continue to assess barriers to  discharge/monitor patient progress toward functional and medical goals  Care Tool:  Bathing    Body parts bathed by patient: Abdomen, Chest, Front perineal area, Right upper leg, Left upper leg, Left arm, Face   Body parts bathed by helper: Right arm, Buttocks, Left lower leg, Right lower leg     Bathing assist Assist Level: Moderate Assistance - Patient 50 - 74%     Upper Body Dressing/Undressing Upper body dressing   What is the patient wearing?: Bra, Pull over shirt    Upper body assist Assist Level: Minimal Assistance - Patient > 75%    Lower Body Dressing/Undressing Lower body dressing      What is the patient wearing?: Pants, Incontinence brief     Lower body assist Assist for lower body dressing: Moderate Assistance - Patient 50 - 74%     Toileting Toileting    Toileting assist Assist for toileting: Maximal Assistance - Patient 25 - 49%     Transfers Chair/bed transfer  Transfers assist  Chair/bed transfer activity did not occur: Safety/medical concerns  Chair/bed transfer assist level: Minimal Assistance - Patient > 75%     Locomotion Ambulation   Ambulation assist   Ambulation activity did not occur: Safety/medical concerns  Assist level: Maximal Assistance - Patient 25 - 49% Assistive device: Walker-hemi Max distance: 65  ft   Walk 10 feet activity   Assist  Walk 10 feet activity did not occur: Safety/medical concerns  Assist level: Maximal Assistance - Patient 25 - 49% Assistive device: Walker-hemi   Walk 50 feet activity   Assist Walk 50 feet with 2 turns activity did not occur: Safety/medical concerns  Assist level: Maximal Assistance - Patient 25 - 49% Assistive device: Walker-hemi    Walk 150 feet activity   Assist Walk 150 feet activity did not occur: Safety/medical concerns         Walk 10 feet on uneven surface  activity   Assist Walk 10 feet on uneven surfaces activity did not occur: Safety/medical  concerns         Wheelchair     Assist Is the patient using a wheelchair?: Yes Type of Wheelchair: Manual    Wheelchair assist level: Supervision/Verbal cueing Max wheelchair distance: 150    Wheelchair 50 feet with 2 turns activity    Assist        Assist Level: Supervision/Verbal cueing   Wheelchair 150 feet activity     Assist      Assist Level: Supervision/Verbal cueing   Blood pressure 129/89, pulse 87, temperature 97.7 F (36.5 C), temperature source Oral, resp. rate 16, weight 59.4 kg, SpO2 100 %.  Medical Problem List and Plan: 1. 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  FL 2 form send according to social work has 2 offers Copy updates from ins/SW             -Continue CIR therapies including PT, OT, and SLP  would benefit from subacute inpt  therapy which would exceed amt available through [facility], needs nursing care as well, not requiring hospital level medical care  2.  Antithrombotics: -DVT/anticoagulation:  Pharmaceutical: cont Lovenox , doppler without evidence of DVT              -antiplatelet therapy: continue Plavix and aspirin as PTA 3. Pain Management: tylenol as needed 4. Depression/insomnia: LCSW to evaluate and provide emotional support             -antipsychotic agents: n/a             -bupropion ER 150 mg q AM             -continue mirtazapine 30 mg q HS             -continue alprazolam 0.5 mg daily prn anxiety  -10/7 start melatonin PRN 5. Neuropsych/cognition: This patient is capable of making decisions on her own behalf. 6. Skin/Wound Care: routine skin care checks 7. Dysphagia: routine Is and Os and follow-up chemistries             continue Dysphagia  2 diet/thin-SLP Need to enc fluids    9: Hypertension: continue to monitor;  avoid hypotension   Vitals:   08/30/22 2009 08/31/22 0538  BP: 128/72 129/89  Pulse: 95 87  Resp: 18 16  Temp: 97.7 F (36.5 C) 97.7 F (36.5 C)  SpO2: 100% 100%   10/18 BP normal cont Cardizem CD '240mg'$  per day  10: Hyperlipidemia: LDL = 125, counsel regarding healthy diet             -  continue  ezetimibe 10 mg daily             -continue atorvastatin 90 mg daily 11: Tobacco use: cessation counseling             -continue nicotine patch 21 mg 12. Constipation           -LBM 10/12 medium , type 2 - increase Senna S to BID , continue BID miralax 13. GERD             -was not taking PPI at home, was taking famotidine qhs  14.  Dysarthria improving continue SLP  -On dys 2 diet with thin liquids  15. Insomnia- remeron '30mg'$  qhs      LOS: 30 days A FACE TO FACE EVALUATION WAS PERFORMED  Charlett Blake 08/31/2022, 8:13 AM

## 2022-08-31 NOTE — Progress Notes (Signed)
Physical Therapy Session Note  Patient Details  Name: Kara Bailey MRN: [id number] Date of Birth: 1956/07/17  Today's Date: 08/30/2022 PT Individual Time: 808-850 and 1640-1725   42 min and 45 min   Short Term Goals: Week 1:  PT Short Term Goal 1 (Week 1): Pt will perform bed mobility with max assist of 1 consistently PT Short Term Goal 1 - Progress (Week 1): Met PT Short Term Goal 2 (Week 1): Pt will sit EOB with CGA >3 mintues PT Short Term Goal 2 - Progress (Week 1): Met PT Short Term Goal 3 (Week 1): Pt will propell WC >100with min assist PT Short Term Goal 3 - Progress (Week 1): Met PT Short Term Goal 4 (Week 1): Pt will tolerate standing >1 min with max assist from PT PT Short Term Goal 4 - Progress (Week 1): Met Week 2:  PT Short Term Goal 1 (Week 2): pt will perform bed mobiltiy with mod assist PT Short Term Goal 1 - Progress (Week 2): Met PT Short Term Goal 2 (Week 2): Pt will transfer to [facility] with mod assist PT Short Term Goal 2 - Progress (Week 2): Met PT Short Term Goal 3 (Week 2): Pt will propell WC 17f with min assist PT Short Term Goal 3 - Progress (Week 2): Met PT Short Term Goal 4 (Week 2): Pt will perform sit<>stand with mod assist consistently PT Short Term Goal 4 - Progress (Week 2): Met Week 3:  PT Short Term Goal 1 (Week 3): [facility] Short Term Goal 1 - Progress (Week 3): Progressing toward goal PT Short Term Goal 2 (Week 3): Pt wil ambulate 529fwith max A +2. PT Short Term Goal 2 - Progress (Week 3): Met PT Short Term Goal 3 (Week 3): Pt will propell WC 15096fith CGA PT Short Term Goal 3 - Progress (Week 3): Met PT Short Term Goal 4 (Week 3): Pt will perform 5xSTS to assess fall risk PT Short Term Goal 4 - Progress (Week 3): Met Week 4:  PT Short Term Goal 1 (Week 4): STG=LTG due to ELOS  Skilled Therapeutic Interventions/Progress Updates:  Session 1.  Pt received supine in bed and agreeable to PT. Supine>sit  transfer with min  assist and cues for log roll the R to sit on EOB. Sit<>stand from EOB with mod assist to doff soiled brief and don clean pants. Sitting balacen EOB x 5 min while MD performed daily rounding. Sqaut pivot transfer to WC [facility] min assist and cues for improved lift off sitting surface. WC mobility through hall with supervision assist x [id number] pivot transfer to mat table with mod assist from PT for safety. Sitting balance on edge of elevated mat table to bear weight through BUE on therapy ball with forward press x 10 and lateral press to each with assist to stabilize the LUE. Patient returned to room and [facility] in reach and all needs met.    Session 2.  Pt received sitting in WC and agreeable to PT. Pt transported to entrance of W[facility].Major mobility with CGA to improve speed control on down hill grade 3 x 200ft28fit training with HW x 30ft 46f with max assist for weight shift R to allow pt to advance the LLE and then to initiate hip extension and terminal extension. Patient returned to room and left sitting in [facility]  and all needs met.        Therapy Documentation Precautions:  Precautions Precautions: Fall Precaution Comments: L hemi. expressive aphasia Restrictions Weight Bearing Restrictions: No  Pain: Denies in session 2     Therapy/Group: Individual Therapy  Lorie Phenix 08/31/2022, 5:13 AM

## 2022-08-31 NOTE — Progress Notes (Signed)
Occupational Therapy Session Note  Patient Details  Name: Kara Bailey MRN: [id number] Date of Birth: 1955-12-14  Today's Date: 08/31/2022 OT Individual Time: 0930-1015 OT Individual Time Calculation (min): 45 min    Short Term Goals: Week 4:  OT Short Term Goal 1 (Week 4): STGs = LTGs  Skilled Therapeutic Interventions/Progress Updates:    Pt received in bed and needed some convincing to take a shower today. Pt continues to express feeling discouraged.  She did agree to a shower.  See ADL documentation below. Overall, she was able to control her posture more that she has in the past and therefore she did all of her transfers with CGA to min, rising to stand with CGA using rail on R and she was then able to hold static stand with R hand support with close S to enable me to easily assist her with washing bottom in the shower and adjusting pants for dressing.  Encouraged pt to keep working hard as she demonstrated strong progress today.   Pt resting in wc with belt alarm on and all needs met.   Therapy Documentation Precautions:  Precautions Precautions: Fall Precaution Comments: L hemi. expressive aphasia Restrictions Weight Bearing Restrictions: No     Pain: Pain Assessment Pain Score: 0-No pain ADL: ADL Eating: Set up Grooming: Supervision/safety Where Assessed-Grooming: Sitting at sink Upper Body Bathing: Supervision/safety, Minimal cueing (using a long sponge to reach RUE) Where Assessed-Upper Body Bathing: Shower Lower Body Bathing: Minimal assistance (A to wash bottom) Where Assessed-Lower Body Bathing: Shower Upper Body Dressing: Minimal assistance Where Assessed-Upper Body Dressing: Wheelchair Lower Body Dressing: Moderate assistance Where Assessed-Lower Body Dressing: Wheelchair Toileting: Maximal assistance Where Assessed-Toileting: Glass blower/designer: Moderate assistance Toilet Transfer Method: Squat pivot Toilet Transfer Equipment: Grab  bars Tub/Shower Transfer: Maximal assistance, Moderate assistance Tub/Shower Transfer Method: Sit pivot Tub/Shower Equipment: Facilities manager: Environmental education officer Method: Education officer, environmental: Radio broadcast assistant, Grab bars ADL Comments: Pt able to recall hemi-techniques with grooming tasks learned in previous OT session.    Therapy/Group: Individual Therapy  [place] 08/31/2022, 11:44 AM

## 2022-08-31 NOTE — Progress Notes (Addendum)
Patient ID: Kara Bailey, female   DOB: 1956/04/22, 66 y.o.   MRN: [id number]  SW received notification from [facility] in reference of requesting a physician peer to peer to make determination of SNF.   Peer to peer information provided to physician to complete. Peer to Peer will remain open until 10/23 at 11:00 AM.    [phone]- peer to peer line.  Reference # [id number]  Member ID: [id number]

## 2022-09-01 MED ORDER — DILTIAZEM HCL 60 MG PO TABS
60.0000 mg | ORAL_TABLET | Freq: Four times a day (QID) | ORAL | Status: DC
  Administered 2022-09-01 – 2022-09-04 (×12): 60 mg via ORAL
  Filled 2022-09-01 (×13): qty 1

## 2022-09-01 NOTE — Progress Notes (Signed)
Occupational Therapy Session Note  Patient Details  Name: Kara Bailey MRN: [id number] Date of Birth: Nov 03, 1956  Today's Date: 09/01/2022 OT Individual Time: 2951-8841 OT Individual Time Calculation (min): 60 min    Short Term Goals: Week 4:  OT Short Term Goal 1 (Week 4): STGs = LTGs  Skilled Therapeutic Interventions/Progress Updates:    Upon OT arrival, pt semi recumbent in bed reporting no pain and is agreeable to OT treatment. Treatment intervention with a focus on self care retraining, functional transfers, w/c mobility, and standing balance. Pt completes LB dressing partially bed level and partially seated/standing EOB. Regard levels below for LB/UB dressing and grooming. Pt requires significant increased time for dressing tasks and verbal cues for transfers including flexing trunk anteriorly for transfers and scooting EOB. Pt performs sit to stand transfer with Min A and stands with Mod-Min A. Educated on elastic shoe laces and pt was issued 2 pairs. Pt completes stand pivot transfer with Max A to w/c. Elastic shoe laces were donned. Pt performs w/c mobility with Min A from her room to dayroom and around nurses station. Pt was left in w/c at end of session with all needs met.   Therapy Documentation Precautions:  Precautions Precautions: Fall Precaution Comments: L hemi. expressive aphasia Restrictions Weight Bearing Restrictions: No    ADL: Grooming: Independent Where Assessed-Grooming: Sitting at sink Upper Body Bathing: Supervision/safety, Minimal cueing (using a long sponge to reach RUE) Where Assessed-Upper Body Bathing: Shower Lower Body Bathing: Minimal assistance (A to wash bottom) Where Assessed-Lower Body Bathing: Shower Upper Body Dressing: Minimal assistance Where Assessed-Upper Body Dressing: Edge of bed Lower Body Dressing: Maximal assistance (to donn socks and manage pants over hips and thread to L LE, donn L shoe and AFO and tie B shoes) Where  Assessed-Lower Body Dressing: Edge of bed    Therapy/Group: Individual Therapy  [name] 09/01/2022, 9:26 AM

## 2022-09-01 NOTE — Progress Notes (Signed)
Occupational Therapy Session Note  Patient Details  Name: Kara Bailey MRN: [id number] Date of Birth: 03/21/1956  Today's Date: 09/01/2022 OT Individual Time: 0217-0302 OT Individual Time Calculation (min): 45 min    Short Term Goals: Week 1:  OT Short Term Goal 1 (Week 1): Pt will be able to complete a sq pivot toilet transfer with max A of 1. OT Short Term Goal 1 - Progress (Week 1): Met OT Short Term Goal 2 (Week 1): Pt will be able to rise to stand and hold grab bar with R hand with mod A of 1. OT Short Term Goal 2 - Progress (Week 1): Met OT Short Term Goal 3 (Week 1): Pt will hold static stand with RUE support during LB self care. OT Short Term Goal 3 - Progress (Week 1): Not met OT Short Term Goal 4 (Week 1): Pt will don a pullover shirt with min A OT Short Term Goal 4 - Progress (Week 1): Not met OT Short Term Goal 5 (Week 1): Pt will demonstrate self management of L arm with positioning with min cues. OT Short Term Goal 5 - Progress (Week 1): Met  Skilled Therapeutic Interventions/Progress Updates:    The patient was seated in her room at  w/c LOF upon arrival , with no report of pain . The pt  agreed to working on BADL related task and NMR of the RUE to improve function for bilateral hand manipulation.  The pt tolerated manual manipulation of the scapular to improve anatomical positioning to enhance communication with more distal extremities. The pt was instructed in completing  shld shrugs, supination , pronation, radial, ulnar deviation, and flexion and extension of the distal phalanges using the RUE for positioning after demonstration with physical assistance.  The pt went on to demonstrate AROM by threading  her LUE with her RUE, I maintained anatomical positioning of  the   R glenohumeral joint, while the pt raised the RUE into shld flexion.  The pt was also instructed in  manually manipulate exercise of the proximal and distal phalanges while incorporating relaxation  breathing to relax the joints into extension.  Following the exercises, the pt was instructed to use her RUE to remove suction pegs off a surface that allowed retrieval crossing  midline to improve her overall functional reach .  At the end treatment, the pt remained at w/c LOF with her bedside side and call light within reach and her chair alarm activated.   All additional needs were addressed prior to exiting the room Therapy Documentation Precautions:  Precautions Precautions: Fall Precaution Comments: L hemi. expressive aphasia Restrictions Weight Bearing Restrictions: No   Therapy/Group: Individual Therapy  Lorenda D Jackson 09/01/2022, 3:32 PM 

## 2022-09-01 NOTE — Progress Notes (Signed)
Physical Therapy Weekly Progress Note  Patient Details  Name: Kara Bailey MRN: [id number] Date of Birth: [birth year]  Beginning of progress report period: August 25, 2022 End of progress report period: September 01, 2022  Today's Date: 09/01/2022 PT Individual Time: 1121-1200 PT Individual Time Calculation (min): 39 min   Patient has met 1 of 1 short term goals.  Pt is making slow progress towards LTG. Pt's LOS has been extended pending d/c to SNF. Family has not been present since LOS extension.  Patient continues to demonstrate the following deficits muscle weakness, muscle joint tightness, and muscle paralysis, decreased cardiorespiratoy endurance, abnormal tone and unbalanced muscle activation, decreased attention to left, and decreased sitting balance, decreased standing balance, decreased postural control, hemiplegia, and decreased balance strategies and therefore will continue to benefit from skilled PT intervention to increase functional independence with mobility.  Patient progressing toward long term goals..  Continue plan of care.  PT Short Term Goals Week 3:  PT Short Term Goal 1 (Week 3): Pt will transfer to and from [facility] with min assist PT Short Term Goal 1 - Progress (Week 3): Progressing toward goal PT Short Term Goal 2 (Week 3): Pt wil ambulate 35f with max A +2. PT Short Term Goal 2 - Progress (Week 3): Met PT Short Term Goal 3 (Week 3): Pt will propell WC 158fwith CGA PT Short Term Goal 3 - Progress (Week 3): Met PT Short Term Goal 4 (Week 3): Pt will perform 5xSTS to assess fall risk PT Short Term Goal 4 - Progress (Week 3): Met Week 4:  PT Short Term Goal 1 (Week 4): STG=LTG due to ELOS PT Short Term Goal 1 - Progress (Week 4): Progressing toward goal Week 5:  PT Short Term Goal 1 (Week 5): STG=LTG due to ELOS  Skilled Therapeutic Interventions/Progress Updates:   Pt received sitting in WC and agreeable to PT. [facility] to [facility] table with min assist from PT. Throughout session pt performed sit<>stand in stedy with supervision assist from mat table. Performed static standing in stedy with min-mod assist to achieve full hip extension on the L. Lateral reach in partial stand and in full standing 2 x each with assist from PT to sustain elbow extension on the LUE and with forced WB through the stedy handle. WC mobility through hall x 15080fth supervision assist and cues for safety through hall.       Therapy Documentation Precautions:  Precautions Precautions: Fall Precaution Comments: L hemi. expressive aphasia Restrictions Weight Bearing Restrictions: No   Pain:  denies   Therapy/Group: Individual Therapy  AusLorie Phenix/21/2023, 5:54 PM

## 2022-09-02 MED ORDER — TRAMADOL HCL 50 MG PO TABS
50.0000 mg | ORAL_TABLET | Freq: Four times a day (QID) | ORAL | Status: DC | PRN
  Administered 2022-09-02 – 2022-09-07 (×8): 50 mg via ORAL
  Filled 2022-09-02 (×8): qty 1

## 2022-09-02 MED ORDER — SORBITOL 70 % SOLN
30.0000 mL | Freq: Once | Status: AC
  Administered 2022-09-02: 30 mL via ORAL
  Filled 2022-09-02: qty 30

## 2022-09-02 NOTE — Progress Notes (Signed)
PRN xanax given at 2108 per patient's request. Incontinent of B & B. Doesn't call when incontinent. Needs anticipated by staff. Patrici Ranks A

## 2022-09-02 NOTE — Progress Notes (Signed)
Occupational Therapy Session Note  Patient Details  Name: Kara Bailey MRN: [id number] Date of Birth: 02/01/1956  Today's Date: 09/02/2022 OT Individual Time: 3295-1884 OT Individual Time Calculation (min): 60 min    Short Term Goals: Week 1:  OT Short Term Goal 1 (Week 1): Pt will be able to complete a sq pivot toilet transfer with max A of 1. OT Short Term Goal 1 - Progress (Week 1): Met OT Short Term Goal 2 (Week 1): Pt will be able to rise to stand and hold grab bar with R hand with mod A of 1. OT Short Term Goal 2 - Progress (Week 1): Met OT Short Term Goal 3 (Week 1): Pt will hold static stand with RUE support during LB self care. OT Short Term Goal 3 - Progress (Week 1): Not met OT Short Term Goal 4 (Week 1): Pt will don a pullover shirt with min A OT Short Term Goal 4 - Progress (Week 1): Not met OT Short Term Goal 5 (Week 1): Pt will demonstrate self management of L arm with positioning with min cues. OT Short Term Goal 5 - Progress (Week 1): Met  Skilled Therapeutic Interventions/Progress Updates:    Patient seen this day for skilled OT service, patient very frustrated and upset upon arrival, further, she was unable to verbalize her needs and was extremely emotional regarding her current functional status.  The pt agreed to work on [place].  The pt tolerated manual manipulation of the R scapular to improve mobility.  The pt was able to engage in shld shrugs 1 set of 10, I explained to the pt that return in function is typically proximal to distal and she could possibly benefit from focusing of the given hand movement and using her vision to assist in the process.  I went on to complete supination and pronation with the pt and we transition from that exercise to completing ulnar and radial deviation. The pt was very receptive to feedback.  The pt went on to complete UB dressing using the hemi technique, which she was able to complete with initial vc's and demonstration at  [facility].  I finished the session with assisting the pt with proper placement of her RUE in the arm rest with her bedside table and call light within reach, the chair alarm was activated.  The pt had no c/o pain during this OT treatment session.   Therapy Documentation Precautions:  Precautions Precautions: Fall Precaution Comments: L hemi. expressive aphasia Restrictions Weight Bearing Restrictions: No  Therapy/Group: Individual Therapy  Yvonne Kendall 09/02/2022, 12:37 PM

## 2022-09-02 NOTE — Progress Notes (Addendum)
Physical Therapy Session Note  Patient Details  Name: Kara Bailey MRN: [id number] Date of Birth: Jul 21, 1956  Today's Date: 09/02/2022 PT Individual Time:1300  - 1404, 64 min     Short Term Goals:  Week 5:  PT Short Term Goal 1 (Week 5): STG=LTG due to ELOS  Skilled Therapeutic Interventions/Progress Updates:  Pt received sitting in her wc with L arm support in place  She denied pain.  PT donned bil shoes, not AFO.   Wc propulsion using hemi method, level tile x 100' with supervision.   neuromuscular re-education via forced use, multimodal cues, for alternating reciprocal movement bil LEs, seated in wc using Kinetron at resistance 50 cm/sec, x 25 cycles.  Unilateral use of LLE without resistance, PT operating opposite foot plate, x 25 cycles, targeting gluteals in trunk flexion and > 100 degrees knee flexion, x 25 cycles targeting quads with neutral trunk and 90 degrees knee flexion.  Pt indicated each LLE muscle group that she felt activating when using Kinetron.  Dynamic sitting balance activities: -reaching out of BOS for 12 cones with R hand to R and forward, for R trunk lengthening/ L trunk shortening, and trunk rotation. Pt had LOB 1/12 cones. - reaching under R hip and thigh to remove 5 small bean bags which PT placed there, for R trunk shortening/L trunk lengthening with LUE wt bearing. Pt had LOB 1/5 bean bags. -L lateral leans for LUE wt bearing, x 10 with mod assist.  Transfer training to promote forward wt shift and elevating hips for squat pivot, with R hand placed on mat table to prevent over-pushing with it, max assist.   At conclusion of session, pt seated in wc with L arm support in place, seat belt alarm set and needs at hand.  Pt stated "that was fun!"  Therapy Documentation Precautions:  Precautions Precautions: Fall Precaution Comments: L hemi. expressive aphasia Restrictions Weight Bearing Restrictions: No        Therapy/Group: Individual  Therapy  [name] 09/02/2022, 7:55 AM

## 2022-09-02 NOTE — Progress Notes (Addendum)
PROGRESS NOTE   Subjective/Complaints:  Pt reports "ready to go home"- LBM 3 days ago pt thought was still 2 days since LBM Denies any other issues Required Xanax dose last night- doesn't let nursing know when incontinent of bowel or bladder.     ROS:  limited due to severe dysarthria  Objective:   No results found. No results for input(s): "WBC", "HGB", "HCT", "PLT" in the last 72 hours.   No results for input(s): "NA", "K", "CL", "CO2", "GLUCOSE", "BUN", "CREATININE", "CALCIUM" in the last 72 hours.    Intake/Output Summary (Last 24 hours) at 09/02/2022 1308 Last data filed at 09/01/2022 2300 Gross per 24 hour  Intake 240 ml  Output --  Net 240 ml        Physical Exam: Vital Signs Blood pressure 134/86, pulse 99, temperature 98.1 F (36.7 C), temperature source Oral, resp. rate 16, height 5' 5.98" (1.676 m), weight 59.4 kg, SpO2 100 %.    General: awake, alert, sitting up in bed;  NAD HENT: ; oropharynx moist CV: regular but borderline tachycardic  rate; no JVD Pulmonary: CTA B/L; no W/R/R- good air movement GI: soft, NT, a little distended vs protuberant; hypoactive BS Psychiatric: perseverative; flat- but also a little anxious Neurological: severe dysarthria- perseverates on going home,- obviously not ready PE from prior encounter:  motor strength is 5/5 in right , 0/5 left deltoid, bicep, tricep, grip, hip flexor, knee extensors, ankle dorsiflexor and plantar flexor Trace Left hip/knee ext synergy  Tone MAS 3 in left pec and biceps Sensory exam pt reports some sensation but impaired LUE>LLE Musculoskeletal: pain with Left shoulder ER and abd    No joint swelling or tenderness noted   Assessment/Plan: 1. Functional deficits which require 3+ hours per day of interdisciplinary therapy in a comprehensive inpatient rehab setting. Physiatrist is providing close team supervision and 24 hour management of  active medical problems listed below. Physiatrist and rehab team continue to assess barriers to discharge/monitor patient progress toward functional and medical goals  Care Tool:  Bathing    Body parts bathed by patient: Abdomen, Chest, Front perineal area, Right upper leg, Left upper leg, Left arm, Face, Right arm, Right lower leg, Left lower leg (used long handled sponge)   Body parts bathed by helper: Right arm, Buttocks, Left lower leg, Right lower leg     Bathing assist Assist Level: Minimal Assistance - Patient > 75%     Upper Body Dressing/Undressing Upper body dressing   What is the patient wearing?: Bra, Pull over shirt    Upper body assist Assist Level: Minimal Assistance - Patient > 75%    Lower Body Dressing/Undressing Lower body dressing      What is the patient wearing?: Pants, Incontinence brief     Lower body assist Assist for lower body dressing: Moderate Assistance - Patient 50 - 74%     Toileting Toileting    Toileting assist Assist for toileting: Moderate Assistance - Patient 50 - 74%     Transfers Chair/bed transfer  Transfers assist  Chair/bed transfer activity did not occur: Safety/medical concerns  Chair/bed transfer assist level: Contact Guard/Touching assist     Locomotion Ambulation  Ambulation assist   Ambulation activity did not occur: Safety/medical concerns  Assist level: Maximal Assistance - Patient 25 - 49% Assistive device: Walker-hemi Max distance: 65 ft   Walk 10 feet activity   Assist  Walk 10 feet activity did not occur: Safety/medical concerns  Assist level: Maximal Assistance - Patient 25 - 49% Assistive device: Walker-hemi   Walk 50 feet activity   Assist Walk 50 feet with 2 turns activity did not occur: Safety/medical concerns  Assist level: Maximal Assistance - Patient 25 - 49% Assistive device: Walker-hemi    Walk 150 feet activity   Assist Walk 150 feet activity did not occur: Safety/medical  concerns         Walk 10 feet on uneven surface  activity   Assist Walk 10 feet on uneven surfaces activity did not occur: Safety/medical concerns         Wheelchair     Assist Is the patient using a wheelchair?: Yes Type of Wheelchair: Manual    Wheelchair assist level: Supervision/Verbal cueing Max wheelchair distance: 150    Wheelchair 50 feet with 2 turns activity    Assist        Assist Level: Supervision/Verbal cueing   Wheelchair 150 feet activity     Assist      Assist Level: Supervision/Verbal cueing   Blood pressure 134/86, pulse 99, temperature 98.1 F (36.7 C), temperature source Oral, resp. rate 16, height 5' 5.98" (1.676 m), weight 59.4 kg, SpO2 100 %.  Medical Problem List and Plan: 1. 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  FL 2 form send according to social work has 2 offers Copy updates from ins/SW             -Continue CIR therapies including PT, OT, and SLP  would benefit from subacute inpt  therapy which would exceed amt available through [facility], needs nursing care as well, not requiring hospital level medical care   Con't CIR- PT, OT and SLP 2.  Antithrombotics: -DVT/anticoagulation:  Pharmaceutical: cont Lovenox , doppler without evidence of DVT              -antiplatelet therapy: continue Plavix and aspirin as PTA 3. Pain Management: tylenol as needed  10/22- will add tramadol 50 mg q6 hours prn for severe pain- nursing feels it's legitimate 4. Depression/insomnia: LCSW to evaluate and provide emotional support             -antipsychotic agents: n/a             -bupropion ER 150 mg q AM             -continue mirtazapine 30 mg q HS              -continue alprazolam 0.5 mg daily prn anxiety  -10/7 start melatonin PRN  10/22- pt remembers to ask for Xanax, however cannot call when incontinent 5. Neuropsych/cognition: This patient is capable of making decisions on her own behalf. 6. Skin/Wound Care: routine skin care checks 7. Dysphagia: routine Is and Os and follow-up chemistries             continue Dysphagia 2 diet/thin-SLP Need to  enc fluids    9: Hypertension: continue to monitor;  avoid hypotension   Vitals:   09/01/22 1939 09/02/22 0426  BP: 137/81 134/86  Pulse: (!) 101 99  Resp: 17 16  Temp: 98.2 F (36.8 C) 98.1 F (36.7 C)  SpO2: 100% 100%   10/18 BP normal cont Cardizem CD '240mg'$  per day 10/22- BP controlled- HR a little on high side- con't regimen  10: Hyperlipidemia: LDL = 125, counsel regarding healthy diet             -continue  ezetimibe 10 mg daily             -continue atorvastatin 90 mg daily 11: Tobacco use: cessation counseling             -continue nicotine patch 21 mg 12. Constipation           -LBM 10/12 medium , type 2 - increase Senna S to BID , continue BID miralax  10/23- LBM 3 days ago- will give Sorbitol after therapy today 13. GERD             -was not taking PPI at home, was taking famotidine qhs  14.  Dysarthria improving continue SLP  -On dys 2 diet with thin liquids  15. Insomnia- remeron '30mg'$  qhs      LOS: 32 days A FACE TO FACE EVALUATION WAS PERFORMED  Laronn Devonshire 09/02/2022, 1:08 PM

## 2022-09-03 LAB — CBC
HCT: 36.3 % (ref 36.0–46.0)
Hemoglobin: 11.4 g/dL — ABNORMAL LOW (ref 12.0–15.0)
MCH: 29.5 pg (ref 26.0–34.0)
MCHC: 31.4 g/dL (ref 30.0–36.0)
MCV: 94 fL (ref 80.0–100.0)
Platelets: 281 10*3/uL (ref 150–400)
RBC: 3.86 MIL/uL — ABNORMAL LOW (ref 3.87–5.11)
RDW: 15 % (ref 11.5–15.5)
WBC: 7.5 10*3/uL (ref 4.0–10.5)
nRBC: 0 % (ref 0.0–0.2)

## 2022-09-03 LAB — BASIC METABOLIC PANEL
Anion gap: 6 (ref 5–15)
BUN: 12 mg/dL (ref 8–23)
CO2: 25 mmol/L (ref 22–32)
Calcium: 9.6 mg/dL (ref 8.9–10.3)
Chloride: 110 mmol/L (ref 98–111)
Creatinine, Ser: 1 mg/dL (ref 0.44–1.00)
GFR, Estimated: >60 mL/min (ref >60)
Glucose, Bld: 91 mg/dL (ref 70–99)
Potassium: 4.2 mmol/L (ref 3.5–5.1)
Sodium: 141 mmol/L (ref 135–145)

## 2022-09-03 NOTE — Progress Notes (Signed)
Speech Language Pathology Daily Session Note  Patient Details  Name: Kara Bailey MRN: [id number] Date of Birth: 12/19/1955  Today's Date: 09/03/2022 SLP Individual Time: 1103-1207 SLP Individual Time Calculation (min): 64 min  Short Term Goals: Week 4: SLP Short Term Goal 1 (Week 4): STG=LTG due to ELOS SLP Short Term Goal 1 - Progress (Week 4): Progressing toward goal  Skilled Therapeutic Interventions: Skilled ST treatment focused on speech and swallowing goals. SLP facilitated dysphagia 3 PO trials. Pt consumed with prolonged mastication, occasional anterior spillage, mild oral residuals, and without overt s/sx concerning for airway invasion. Pt consumed thin liquid rinses by straw to aid oral clearance which was effective and did not appear to result in any increased s/sx of aspiration. Recommend ongoing dysphagia 3 trials with trial tray during upcoming session as time permits.   SLP facilitated breath support techniques, speech intelligibility strategies, and vocal intensity exercises with repeated diaphragmatic breathing practice with mod fading to min A demonstration cues; sustained "ah" to achieve up to 5 seconds with average vocal quality with max A cues; speech production of 1-2 syllable words to achieve 50-60% intelligibility at the word level with max fading to mod A verbal cues. Pt required moderate A verbal cues to consistently implement speech techniques to achieve ~70% intelligibility during both structured and unstructured speech tasks, otherwise was known to be ~25-50% intelligible at the word level without SLP cueing with evident decreased carry over. Pt continues to benefit from taking a small sip of water to increase vocal intensity and clarity. In addition to ongoing encouragement, SLP spent end of session extensively reviewing/educating on breath support and speech strategies, as discussed not only today, but in past sessions as well. Pt was able to verbalize understanding  through teach back as well as demonstration with min-to-mod A support.   Patient was left in wheelchair with alarm activated and immediate needs within reach at end of session. Continue per current plan of care.       Pain Pain Assessment Pain Scale: 0-10 Pain Score: 0-No pain  Therapy/Group: Individual Therapy  Patty Sermons 09/03/2022, 11:36 AM

## 2022-09-03 NOTE — Progress Notes (Signed)
Physical Therapy Session Note  Patient Details  Name: Kara Bailey MRN: [id number] Date of Birth: 09-09-1956  Today's Date: 09/03/2022 PT Individual Time: 0807-0850 PT Individual Time Calculation (min): 43 min   Short Term Goals: Week 3:  PT Short Term Goal 1 (Week 3): Pt will transfer to and from [facility] with min assist PT Short Term Goal 1 - Progress (Week 3): Progressing toward goal PT Short Term Goal 2 (Week 3): Pt wil ambulate 103f with max A +2. PT Short Term Goal 2 - Progress (Week 3): Met PT Short Term Goal 3 (Week 3): Pt will propell WC 1562fwith CGA PT Short Term Goal 3 - Progress (Week 3): Met PT Short Term Goal 4 (Week 3): Pt will perform 5xSTS to assess fall risk PT Short Term Goal 4 - Progress (Week 3): Met Week 4:  PT Short Term Goal 1 (Week 4): STG=LTG due to ELOS PT Short Term Goal 1 - Progress (Week 4): Progressing toward goal Week 5:  PT Short Term Goal 1 (Week 5): STG=LTG due to ELOS  Skilled Therapeutic Interventions/Progress Updates:  Patient supine in bed on entrance to room. Patient alert and agreeable to PT session.   Patient with no pain complaint throughout session.  Therapeutic Activity: Bed Mobility: Pt performed supine --> sit with use of bed rail and CGA/ MinA for forward scooting toward EOB. Finally tires and requires ModA with iniitation of scoot to bring L side forward to EOB.  VC/ tc required for continued effort and technique. While seated EOB, pt completes dressing task requiring Mod A overall for pants and heavy cueing with Min/ModA to don pullover shirt.  Transfers: Pt performed sit<>stand at EOB in order to complete donning pants and requires Min/ modA to stand using bedrail for UE support. Cues required to reach full upright posture. MaxA to complete donning pants in order to maintain stance/ balance.   Stand pivot transfer from bed to w/c with ModA and R hand on bedrail then progressed to w/c armrest. ModA for blocking L knee during stance  phase.   Toilet transfer w/c<>toilet using safety rail with MinA and pivoting on ball of R foot.   Seated at sink and completes donning lotion with MaxA to arms, brushing teeth with supervision.  Neuromuscular Re-ed: NMR facilitated during session with focus on standing balance and motor control. Pt guided in upright stance and maintaining balance with use of RUE and without. Able to hold stance for few seconds up to 25 seconds with no UE support and increased lean to R side. Facilitated lean to L for more midline orientation. NMR performed for improvements in motor control and coordination, balance, sequencing, judgement, and self confidence/ efficacy in performing all aspects of mobility at highest level of independence.   Patient seated at end of session with brakes locked, belt alarm set, and all needs within reach.   Therapy Documentation Precautions:  Precautions Precautions: Fall Precaution Comments: L hemi. expressive aphasia Restrictions Weight Bearing Restrictions: No General:   Vital Signs:   Pain: Pain Assessment Pain Scale: 0-10 Pain Score: 0-No pain  Therapy/Group: Individual Therapy  JuAlger SimonsT, DPT, CSRS 09/03/2022, 12:16 PM

## 2022-09-03 NOTE — Progress Notes (Signed)
Occupational Therapy Session Note  Patient Details  Name: Kara Bailey MRN: [id number] Date of Birth: [birth year]  Today's Date: 09/03/2022 OT Individual Time: 6578-4696 OT Individual Time Calculation (min): 40 min    Short Term Goals: Week 4:  OT Short Term Goal 1 (Week 4): STGs = LTGs  Skilled Therapeutic Interventions/Progress Updates:    Pt received in wc dressed and ready for therapy.  Pt taken to gym to focus on LUE NMR.  She continues to need cues and guiding assist for technique with squat pivot transfers. She has been having difficult with carryover of how to achieve forward lean and wt shift to lift her hips and difficulty with head /hip ration. With guiding A, she can do well but had not been able to initiate herself.  Pt transferred to mat with min A.  On mat, worked on LUE wt bearing prior to 15 min of attended estim to forearm for finger and wrist extension.   Pt developed an abdominal cramp sitting, so moved to supine to allow her to stretch out.  In supine, worked on PROM to L arm with tone minimal today in biceps.   She needed significant cues for bed mobility on mat to fully roll onto her left side and how to use R hand and head to assist with sidelying to sit.  Pt able to sit with min A with proper technique.  Transferred to wc with min. Pt returned to room with all needs met, alarm belt on.   Therapy Documentation Precautions:  Precautions Precautions: Fall Precaution Comments: L hemi. expressive aphasia Restrictions Weight Bearing Restrictions: No    Vital Signs: Therapy Vitals BP: 121/80 Pain: Pain Assessment Pain Scale: 0-10 Pain Score: 4  Pain Type: Acute pain Pain Location: Shoulder Pain Descriptors / Indicators: Aching Pain Frequency: Constant Pain Onset: On-going Patients Stated Pain Goal: 3 Pain Intervention(s): Medication (See eMAR) ADL: ADL Eating: Set up Grooming: Independent Where Assessed-Grooming: Sitting at sink Upper Body Bathing:  Supervision/safety, Minimal cueing (using a long sponge to reach RUE) Where Assessed-Upper Body Bathing: Shower Lower Body Bathing: Minimal assistance (A to wash bottom) Where Assessed-Lower Body Bathing: Shower Upper Body Dressing: Minimal assistance Where Assessed-Upper Body Dressing: Edge of bed Lower Body Dressing: Maximal assistance (to donn socks and manage pants over hips and thread to L LE, donn L shoe and AFO and tie B shoes) Where Assessed-Lower Body Dressing: Edge of bed Toileting: Maximal assistance Where Assessed-Toileting: Glass blower/designer: Moderate assistance Toilet Transfer Method: Squat pivot Toilet Transfer Equipment: Grab bars Tub/Shower Transfer: Maximal assistance, Moderate assistance Tub/Shower Transfer Method: Sit pivot Tub/Shower Equipment: Facilities manager: Environmental education officer Method: Education officer, environmental: Radio broadcast assistant, Grab bars ADL Comments: Pt able to recall hemi-techniques with grooming tasks learned in previous OT session.  Therapy/Group: Individual Therapy  Keondrick Dilks 09/03/2022, 8:28 AM

## 2022-09-03 NOTE — Progress Notes (Signed)
Occupational Therapy Weekly Progress Note  Patient Details  Name: Kara Bailey MRN: [id number] Date of Birth: 09-11-1956  Beginning of progress report period: August 24, 2022 End of progress report period: September 03, 2022  Pt had a planned discharge date to home on October 17th, but family did not attend any of the 3 scheduled OT family education sessions.  Just prior to discharge, family felt that did not have systems in place at home to manage her and felt she would be safest at a SNF.  This past week we have continued with facilitation of her movement and ADL skills while waiting for discharge placement.  At this time, discharge placement is pending.  No family education/ training has occurred with OT.  If pt will need to go home vs SNF, family education will need to occur to ensure pt will be safe in her home environment.   Patient continues to demonstrate the following deficits: abnormal tone and decreased coordination, decreased procedural memory for functional mobility, and decreased standing balance, decreased postural control, and hemiplegia and therefore will continue to benefit from skilled OT intervention to enhance overall performance with BADL.  Patient progressing toward long term goals..  Continue plan of care.  OT Short Term Goals Week 1:  OT Short Term Goal 1 (Week 1): Pt will be able to complete a sq pivot toilet transfer with max A of 1. OT Short Term Goal 1 - Progress (Week 1): Met OT Short Term Goal 2 (Week 1): Pt will be able to rise to stand and hold grab bar with R hand with mod A of 1. OT Short Term Goal 2 - Progress (Week 1): Met OT Short Term Goal 3 (Week 1): Pt will hold static stand with RUE support during LB self care. OT Short Term Goal 3 - Progress (Week 1): Not met OT Short Term Goal 4 (Week 1): Pt will don a pullover shirt with min A OT Short Term Goal 4 - Progress (Week 1): Not met OT Short Term Goal 5 (Week 1): Pt will demonstrate self management of L  arm with positioning with min cues. OT Short Term Goal 5 - Progress (Week 1): Met Week 2:  OT Short Term Goal 1 (Week 2): Pt will complete SPT to toilet with moderate assist x 1 with use of LRAD as needed. OT Short Term Goal 1 - Progress (Week 2): Met OT Short Term Goal 2 (Week 2): Pt will don shirt with minimal assistance with moderate prompting for use of hemi-techniques. OT Short Term Goal 2 - Progress (Week 2): Met OT Short Term Goal 3 (Week 2): Pt will perform LB dressing with moderate assistance x 1 using hemi-dressing techniques as well. OT Short Term Goal 3 - Progress (Week 2): Met OT Short Term Goal 4 (Week 2): Pt will complete toileting tasks with moderate assistance x 1. OT Short Term Goal 4 - Progress (Week 2): Partly met OT Short Term Goal 5 (Week 2): Pt will return demo self-ROM techniques with independence on 2 sessions to demo recall and understanding of LUE NMR. OT Short Term Goal 5 - Progress (Week 2): Progressing toward goal Week 3:  OT Short Term Goal 1 (Week 3): Pt will return demo self-ROM techniques with independence on 2 sessions to demo recall and understanding of LUE NMR. OT Short Term Goal 1 - Progress (Week 3): Met OT Short Term Goal 2 (Week 3): Pt will complete toileting tasks with moderate assistance x 1. OT Short  Term Goal 2 - Progress (Week 3): Met Week 4:  OT Short Term Goal 1 (Week 4): STGs = LTGs OT Short Term Goal 1 - Progress (Week 4): Progressing toward goal Week 5:  OT Short Term Goal 1 (Week 5): STGs = LTGs        Therapy Documentation Precautions:  Precautions Precautions: Fall Precaution Comments: L hemi. expressive aphasia Restrictions Weight Bearing Restrictions: No ADL: ADL Eating: Set up Grooming: Independent Where Assessed-Grooming: Sitting at sink Upper Body Bathing: Supervision/safety, Minimal cueing (using a long sponge to reach RUE) Where Assessed-Upper Body Bathing: Shower Lower Body Bathing: Minimal assistance (A to wash  bottom) Where Assessed-Lower Body Bathing: Shower Upper Body Dressing: Minimal assistance Where Assessed-Upper Body Dressing: Edge of bed Lower Body Dressing: Maximal assistance (to donn socks and manage pants over hips and thread to L LE, donn L shoe and AFO and tie B shoes) Where Assessed-Lower Body Dressing: Edge of bed Toileting: Maximal assistance Where Assessed-Toileting: Glass blower/designer: Moderate assistance Toilet Transfer Method: Squat pivot Toilet Transfer Equipment: Grab bars Tub/Shower Transfer: Maximal assistance, Moderate assistance Tub/Shower Transfer Method: Sit pivot Tub/Shower Equipment: Facilities manager: Environmental education officer Method: Education officer, environmental: Radio broadcast assistant, Grab bars ADL Comments: Pt able to recall hemi-techniques with grooming tasks learned in previous OT session.    [place] 09/03/2022, 12:39 PM

## 2022-09-03 NOTE — Progress Notes (Addendum)
Patient ID: Kara Bailey, female   DOB: 1955/12/12, 66 y.o.   MRN: [id number]   Sw spoke with patient son, Kara Bailey and shared the determination of the denial of peer to peer. Patient has been denied SNF placement. Insurance recommend private pay LTC or discharge home.  Patient son informed, understood and agreed to a discharge home. SW will follow up with PT on custom WC and follow up with [employer] in reference  to delivery of DME. No additional questions or concerns.

## 2022-09-03 NOTE — Progress Notes (Addendum)
Patient ID: Kara Bailey, female   DOB: 04/06/1956, 66 y.o.   MRN: [id number]  SW made attempt to contact patient son, [name] to discuss insurance denial for SNF. Left VM, sw will wait for follow up.  X2: 11:48 AM

## 2022-09-03 NOTE — Progress Notes (Signed)
PROGRESS NOTE   Subjective/Complaints:  No issues overnite, discussed insurance denial of SNF with pt and SW  ROS:  limited due to severe dysarthria  Objective:   No results found. Recent Labs    09/03/22 0518  WBC 7.5  HGB 11.4*  HCT 36.3  PLT 281     Recent Labs    09/03/22 0518  NA 141  K 4.2  CL 110  CO2 25  GLUCOSE 91  BUN 12  CREATININE 1.00  CALCIUM 9.6      Intake/Output Summary (Last 24 hours) at 09/03/2022 0921 Last data filed at 09/03/2022 0816 Gross per 24 hour  Intake 356 ml  Output --  Net 356 ml         Physical Exam: Vital Signs Blood pressure 121/80, pulse 85, temperature 97.7 F (36.5 C), temperature source Oral, resp. rate 18, height 5' 5.98" (1.676 m), weight 59.4 kg, SpO2 100 %.   General: No acute distress Mood and affect are appropriate Heart: Regular rate and rhythm no rubs murmurs or extra sounds Lungs: Clear to auscultation, breathing unlabored, no rales or wheezes Abdomen: Positive bowel sounds, soft nontender to palpation, nondistended Extremities: No clubbing, cyanosis, or edema Skin: No evidence of breakdown, no evidence of rash  motor strength is 5/5 in right , 0/5 left deltoid, bicep, tricep, grip, hip flexor, knee extensors, ankle dorsiflexor and plantar flexor Trace Left hip/knee ext synergy  Tone MAS 3 in left pec and biceps Sensory exam pt reports some sensation but impaired LUE>LLE Musculoskeletal: pain with Left shoulder ER and abd    No joint swelling or tenderness noted   Assessment/Plan: 1. Functional deficits which require 3+ hours per day of interdisciplinary therapy in a comprehensive inpatient rehab setting. Physiatrist is providing close team supervision and 24 hour management of active medical problems listed below. Physiatrist and rehab team continue to assess barriers to discharge/monitor patient progress toward functional and medical  goals  Care Tool:  Bathing    Body parts bathed by patient: Abdomen, Chest, Front perineal area, Right upper leg, Left upper leg, Left arm, Face, Right arm, Right lower leg, Left lower leg (used long handled sponge)   Body parts bathed by helper: Right arm, Buttocks, Left lower leg, Right lower leg     Bathing assist Assist Level: Minimal Assistance - Patient > 75%     Upper Body Dressing/Undressing Upper body dressing   What is the patient wearing?: Bra, Pull over shirt    Upper body assist Assist Level: Minimal Assistance - Patient > 75%    Lower Body Dressing/Undressing Lower body dressing      What is the patient wearing?: Pants, Incontinence brief     Lower body assist Assist for lower body dressing: Moderate Assistance - Patient 50 - 74%     Toileting Toileting    Toileting assist Assist for toileting: Moderate Assistance - Patient 50 - 74%     Transfers Chair/bed transfer  Transfers assist  Chair/bed transfer activity did not occur: Safety/medical concerns  Chair/bed transfer assist level: Maximal Assistance - Patient 25 - 49%     Locomotion Ambulation   Ambulation assist   Ambulation activity  did not occur: Safety/medical concerns  Assist level: Maximal Assistance - Patient 25 - 49% Assistive device: Walker-hemi Max distance: 65 ft   Walk 10 feet activity   Assist  Walk 10 feet activity did not occur: Safety/medical concerns  Assist level: Maximal Assistance - Patient 25 - 49% Assistive device: Walker-hemi   Walk 50 feet activity   Assist Walk 50 feet with 2 turns activity did not occur: Safety/medical concerns  Assist level: Maximal Assistance - Patient 25 - 49% Assistive device: Walker-hemi    Walk 150 feet activity   Assist Walk 150 feet activity did not occur: Safety/medical concerns         Walk 10 feet on uneven surface  activity   Assist Walk 10 feet on uneven surfaces activity did not occur: Safety/medical  concerns         Wheelchair     Assist Is the patient using a wheelchair?: Yes Type of Wheelchair: Manual    Wheelchair assist level: Supervision/Verbal cueing Max wheelchair distance: 100    Wheelchair 50 feet with 2 turns activity    Assist        Assist Level: Supervision/Verbal cueing   Wheelchair 150 feet activity     Assist      Assist Level: Supervision/Verbal cueing   Blood pressure 121/80, pulse 85, temperature 97.7 F (36.5 C), temperature source Oral, resp. rate 18, height 5' 5.98" (1.676 m), weight 59.4 kg, SpO2 100 %.  Medical Problem List and Plan: 1. 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  FL 2 form send according to social work has 2 offers pursuing insurance approval.Await updates from [employer] operational officer rec Long term SNF but could not say if pt had insurance benefits to pay.  Is medically stable  2.  Antithrombotics: -DVT/anticoagulation:  Pharmaceutical: cont Lovenox , doppler without evidence of DVT              -antiplatelet therapy: continue Plavix and aspirin as PTA 3. Pain Management: tylenol as needed  10/22- will add tramadol 50 mg q6 hours prn for severe pain- nursing feels it's legitimate 4. Depression/insomnia: LCSW to evaluate and provide emotional support             -antipsychotic agents: n/a             -bupropion ER 150 mg q AM             -continue mirtazapine 30 mg q HS             -continue alprazolam 0.5 mg daily prn anxiety  -10/7 start melatonin PRN  10/22- pt remembers to ask for Xanax, however cannot call when incontinent 5. Neuropsych/cognition: This patient is capable of making decisions on her own behalf. 6. Skin/Wound Care:  routine skin care checks 7. Dysphagia: routine Is and Os and follow-up chemistries             continue Dysphagia 2 diet/thin-SLP Need to enc fluids    9: Hypertension: continue to monitor;  avoid hypotension   Vitals:   09/03/22 0425 09/03/22 0544  BP: 115/74  121/80  Pulse: 85   Resp: 18   Temp: 97.7 F (36.5 C)   SpO2: 100%    10/18 BP normal cont Cardizem CD '240mg'$  per day 10/23 controlled  10: Hyperlipidemia: LDL = 125, counsel regarding healthy diet             -continue  ezetimibe 10 mg daily             -continue atorvastatin 90 mg daily 11: Tobacco use: cessation counseling             -continue nicotine patch 21 mg 12. Constipation           -LBM 10/12 medium , type 2 - increase Senna S to BID , continue BID miralax  10/23- LBM 3 days ago- will give Sorbitol after therapy today 13. GERD             -was not taking PPI at home, was taking famotidine qhs  14.  Dysarthria improving continue SLP  -On dys 2 diet with thin liquids  15. Insomnia- remeron '30mg'$  qhs      LOS: 33 days A FACE TO FACE EVALUATION WAS PERFORMED  Charlett Blake 09/03/2022, 9:21 AM

## 2022-09-03 NOTE — Progress Notes (Signed)
Occupational Therapy Session Note  Patient Details  Name: SARAJANE FAMBROUGH MRN: [id number] Date of Birth: 16-Aug-1956  Today's Date: 09/03/2022 OT Individual Time: 1415-1500 OT Individual Time Calculation (min): 45 min    Short Term Goals: Week 5:  OT Short Term Goal 1 (Week 5): STGs = LTGs  Skilled Therapeutic Interventions/Progress Updates:    Upon OT arrival, pt seated in w/c with significant other, Thayer Jew, present in room. Pt reports no pain and is agreeable to OT treatment. Pt requesting to work on transfers. Pt engages in toilet transfer training 3 times to and from toilet with use of arm rests from [facility] only secondary to pt not having grab bars at home. Pt requires mod verbal and tactile cues for anterior weight shift and scooting laterally. Pt attempts stand pivot and demonstrates Max difficulty to safely complete. Recommended a scoot pivot transfer to safely complete and demonstrates greater difficulty going to the R versus returning to the L but overall requires Mod A. Encouraged pt to have pt's daughter to come in for family training piror to discharge and pt verbalizes understanding. Pt sits in w/c to complete UE exercises/stretches including external/internal rotation, back stretch, long reach, side stretch, and elbow stretch following exercise program provided from pt's primary therapist. Pt demonstrates good understanding of exercises. Pt was left in w/c at end of session with all needs met.  Therapy Documentation Precautions:  Precautions Precautions: Fall Precaution Comments: L hemi. expressive aphasia Restrictions Weight Bearing Restrictions: No    Therapy/Group: Individual Therapy  [name] 09/03/2022, 4:45 PM

## 2022-09-04 MED ORDER — NICOTINE 14 MG/24HR TD PT24
14.0000 mg | MEDICATED_PATCH | Freq: Every day | TRANSDERMAL | Status: DC
  Administered 2022-09-04 – 2022-09-07 (×4): 14 mg via TRANSDERMAL
  Filled 2022-09-04 (×4): qty 1

## 2022-09-04 MED ORDER — DILTIAZEM HCL ER COATED BEADS 240 MG PO CP24
240.0000 mg | ORAL_CAPSULE | Freq: Every day | ORAL | Status: DC
  Administered 2022-09-04 – 2022-09-07 (×4): 240 mg via ORAL
  Filled 2022-09-04 (×4): qty 1

## 2022-09-04 NOTE — Progress Notes (Signed)
Physical Therapy Session Note  Patient Details  Name: Kara Bailey MRN: [id number] Date of Birth: [birth year]  Today's Date: 09/04/2022 PT Individual Time: 0907-1005 PT Individual Time Calculation (min): 58 min   Short Term Goals: Week 3:  PT Short Term Goal 1 (Week 3): Pt will transfer to and from [facility] with min assist PT Short Term Goal 1 - Progress (Week 3): Progressing toward goal PT Short Term Goal 2 (Week 3): Pt wil ambulate 35f with max A +2. PT Short Term Goal 2 - Progress (Week 3): Met PT Short Term Goal 3 (Week 3): Pt will propell WC [id number] CGA PT Short Term Goal 3 - Progress (Week 3): Met PT Short Term Goal 4 (Week 3): Pt will perform 5xSTS to assess fall risk PT Short Term Goal 4 - Progress (Week 3): Met Week 4:  PT Short Term Goal 1 (Week 4): STG=LTG due to ELOS PT Short Term Goal 1 - Progress (Week 4): Progressing toward goal Week 5:  PT Short Term Goal 1 (Week 5): STG=LTG due to ELOS  Skilled Therapeutic Interventions/Progress Updates:  Patient seated upright in w/c on entrance to room. Patient alert and agreeable to PT session. Relates that dtr is not coming for morning PT session but will be here for afternoon session with PT.   Patient with no pain complaint at start of session.  Therapeutic Activity: Transfers: Pt performed lateral scoot and squat pivot transfers throughout session toward R side with ModA in general and one instance of MinA with use of bed rail for RUE support. Performed x6 back and forth with different hand positions. Provided verbal cues for continuing scoot for improved positioning.  Applied coban wrap to grip of pt's personal HW which was delivered to room. Wrap applied for improved comfort and grip.   Wheelchair Mobility:  Pt propelled wheelchair in room demonstrating ability to turn and park chair next to bed to setup for transfer. All with supervision and vc provided for improving technique.  Neuromuscular Re-ed: NMR facilitated  during session with focus on forward weight shifting. Pt guided in forward hand placement to mat table and rise to stand with weight over RUE. Requires MaxA to stand and ModA to weight shift over R hand. Performed x3 to demonstrate ability to control lateral weight shift for improving squat pivot technique. NMR performed for improvements in motor control and coordination, balance, sequencing, judgement, and self confidence/ efficacy in performing all aspects of mobility at highest level of independence.   Patient seated upright in w/c at end of session with brakes locked, belt alarm set, and all needs within reach.   Therapy Documentation Precautions:  Precautions Precautions: Fall Precaution Comments: L hemi. expressive aphasia Restrictions Weight Bearing Restrictions: No General:   Vital Signs: Therapy Vitals Temp: 97.9 F (36.6 C) Pulse Rate: 77 Resp: 15 BP: 119/77 Patient Position (if appropriate): Lying Oxygen Therapy SpO2: 100 % O2 Device: Room Air Pain:  No pain complaint this session.   Therapy/Group: Individual Therapy  JuAlger SimonsT, DPT, CSRS 09/04/2022, 8:25 AM

## 2022-09-04 NOTE — Progress Notes (Addendum)
Patient ID: Kara Bailey, female   DOB: 12-09-1955, 66 y.o.   MRN: [id number]  Sw informed that patient daughter was not present for AM or PM therapy sessions as scheduled and confirmed. Patient will require medical transport home.

## 2022-09-04 NOTE — Progress Notes (Signed)
Speech Language Pathology Daily Session Note  Patient Details  Name: Kara Bailey MRN: [id number] Date of Birth: 1956/01/08  Today's Date: 09/04/2022 SLP Individual Time: 0750-0850 SLP Individual Time Calculation (min): 60 min  Short Term Goals: Week 5: SLP Short Term Goal 1 (Week 5): STG=LTG due to ELOS  Skilled Therapeutic Interventions: Skilled ST treatment focused on swallowing and communication goals. Pt was greeted upright in bed on arrival and agreeable to ST intervention. SLP made plans for dysphagia 3 trial tray however kitchen only sent dysphagia 1 and 2 textures with meal tray. SLP was able to locate dysphagia 3 textures on unit (fruit cup) to trial. Pt performed self feeding with set-up A and consumed with prolonged mastication, weak lingual manipulation resulting in occasional anterior spillage due to tongue thrusting during the swallow, prolonged oral transit as evidenced by lingual pumping, and what appeared to be piecemeal swallow. SLP recommends continuation of dysphagia 2 textures at this time with ongoing trials of dysphagia 3 textures with SLP. If pt to discharge soon, recommend ongoing trials with SLP at next level of care. Nurse arrived for medications. Discussed with pt to trial whole pill(s) in applesauce/pudding to which pt declined and stated she would prefer to have them crushed, as well as crushed medications at home. Educated pt on continuation of dysphagia 2 textures and reinforced safe swallowing compensatory strategies including small bites/sips, slow rate, and upright positioning. Pt verbalized understanding through teach back. SLP facilitated breath support techniques to communicate needs at the word level with mod A verbal cues. Assisted pt with dressing from bed level with min-to-mod A. Pt had wet brief from incontinence. Pt performed peri care with min A; SLP donned new brief with mod-to-max A. Pt transferred to wheelchair with min A and performed oral care at sink  with set-up A. Patient was left in bed with alarm activated and immediate needs within reach at end of session. Continue per current plan of care.     Pain  None/denied  Therapy/Group: Individual Therapy  Josafat Enrico T Kara Bailey 09/04/2022, 8:09 AM

## 2022-09-04 NOTE — Progress Notes (Signed)
Occupational Therapy Session Note  Patient Details  Name: Kara Bailey MRN: [id number] Date of Birth: Aug 27, 1956  Today's Date: 09/04/2022 OT Individual Time: 1105-1205 OT Individual Time Calculation (min): 60 min    Short Term Goals: Week 4:  OT Short Term Goal 1 (Week 4): STGs = LTGs OT Short Term Goal 1 - Progress (Week 4): Progressing toward goal  Skilled Therapeutic Interventions/Progress Updates:  Pt greeted seated in w/c, pt agreeable to OT intervention. Session focus on practicing functional transfer to [facility] over toilet without using grab bars, although did educate pt that through the ADA act pt can ask land lord for needed accommodations such as grab bars. Pt completed squat pivot to BSC over toilet with MIN- MODA. Also worked on standing with hemiwalker with pt able to stand with MIN A for at least 2 mins while family completes toileting hygiene. This OTA even able to support pts L knee with therapists knee to allow caregiver to have 2 available hands to complete toileting hygiene needs. Also practiced squat pivot transfers from EOM<>w/c wit MINA. Pt even able to complete stand pivot transfers with [facility] [place] as pt reports wanting to work on transferring to her recliner at home and pt would need to stand enough to clear arm rests of recliner. Pt completed stand pivot transfer to R/L with MOD A needing assist to maneuver LLE during transfer. Pt pt left seated in w/c with alarm belt activated and all needs within reach.   Therapy Documentation Precautions:  Precautions Precautions: Fall Precaution Comments: L hemi. expressive aphasia Restrictions Weight Bearing Restrictions: No  Pain: Unrated pain reported in LUE, rest breaks provided as needed.     Therapy/Group: Individual Therapy  Precious Haws 09/04/2022, 12:25 PM

## 2022-09-04 NOTE — Progress Notes (Signed)
PROGRESS NOTE   Subjective/Complaints:  Informed pt of d/c plan changed to home setting with family assist   ROS:  limited due to severe dysarthria  Objective:   No results found. Recent Labs    09/03/22 0518  WBC 7.5  HGB 11.4*  HCT 36.3  PLT 281      Recent Labs    09/03/22 0518  NA 141  K 4.2  CL 110  CO2 25  GLUCOSE 91  BUN 12  CREATININE 1.00  CALCIUM 9.6       Intake/Output Summary (Last 24 hours) at 09/04/2022 0746 Last data filed at 09/03/2022 1810 Gross per 24 hour  Intake 1116 ml  Output --  Net 1116 ml         Physical Exam: Vital Signs Blood pressure 119/77, pulse 77, temperature 97.9 F (36.6 C), resp. rate 15, height 5' 5.98" (1.676 m), weight 59.4 kg, SpO2 100 %.   General: No acute distress Mood and affect are appropriate Heart: Regular rate and rhythm no rubs murmurs or extra sounds Lungs: Clear to auscultation, breathing unlabored, no rales or wheezes Abdomen: Positive bowel sounds, soft nontender to palpation, nondistended Extremities: No clubbing, cyanosis, or edema Skin: No evidence of breakdown, no evidence of rash  motor strength is 5/5 in right , 0/5 left deltoid, bicep, tricep, grip, hip flexor, knee extensors, ankle dorsiflexor and plantar flexor Trace Left hip/knee ext synergy  Tone MAS 3 in left pec and biceps Sensory exam intact LT left foot and on RIght side Musculoskeletal: pain with Left shoulder ER and abd    No joint swelling or tenderness noted   Assessment/Plan: 1. Functional deficits which require 3+ hours per day of interdisciplinary therapy in a comprehensive inpatient rehab setting. Physiatrist is providing close team supervision and 24 hour management of active medical problems listed below. Physiatrist and rehab team continue to assess barriers to discharge/monitor patient progress toward functional and medical goals  Care Tool:  Bathing     Body parts bathed by patient: Abdomen, Chest, Front perineal area, Right upper leg, Left upper leg, Left arm, Face, Right arm, Right lower leg, Left lower leg (used long handled sponge)   Body parts bathed by helper: Right arm, Buttocks, Left lower leg, Right lower leg     Bathing assist Assist Level: Minimal Assistance - Patient > 75%     Upper Body Dressing/Undressing Upper body dressing   What is the patient wearing?: Bra, Pull over shirt    Upper body assist Assist Level: Minimal Assistance - Patient > 75%    Lower Body Dressing/Undressing Lower body dressing      What is the patient wearing?: Pants, Incontinence brief     Lower body assist Assist for lower body dressing: Moderate Assistance - Patient 50 - 74%     Toileting Toileting    Toileting assist Assist for toileting: Moderate Assistance - Patient 50 - 74%     Transfers Chair/bed transfer  Transfers assist  Chair/bed transfer activity did not occur: Safety/medical concerns  Chair/bed transfer assist level: Maximal Assistance - Patient 25 - 49%     Locomotion Ambulation   Ambulation assist   Ambulation  activity did not occur: Safety/medical concerns  Assist level: Maximal Assistance - Patient 25 - 49% Assistive device: Walker-hemi Max distance: 65 ft   Walk 10 feet activity   Assist  Walk 10 feet activity did not occur: Safety/medical concerns  Assist level: Maximal Assistance - Patient 25 - 49% Assistive device: Walker-hemi   Walk 50 feet activity   Assist Walk 50 feet with 2 turns activity did not occur: Safety/medical concerns  Assist level: Maximal Assistance - Patient 25 - 49% Assistive device: Walker-hemi    Walk 150 feet activity   Assist Walk 150 feet activity did not occur: Safety/medical concerns         Walk 10 feet on uneven surface  activity   Assist Walk 10 feet on uneven surfaces activity did not occur: Safety/medical concerns          Wheelchair     Assist Is the patient using a wheelchair?: Yes Type of Wheelchair: Manual    Wheelchair assist level: Supervision/Verbal cueing Max wheelchair distance: 100    Wheelchair 50 feet with 2 turns activity    Assist        Assist Level: Supervision/Verbal cueing   Wheelchair 150 feet activity     Assist      Assist Level: Supervision/Verbal cueing   Blood pressure 119/77, pulse 77, temperature 97.9 F (36.6 C), resp. rate 15, height 5' 5.98" (1.676 m), weight 59.4 kg, SpO2 100 %.  Medical Problem List and Plan: 1. 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  Antithrombotics: -DVT/anticoagulation:  Pharmaceutical: cont Lovenox , doppler without evidence of DVT              -antiplatelet therapy: continue Plavix and aspirin as PTA 3. Pain Management: tylenol as needed  10/22- will add tramadol 50 mg q6 hours prn for severe pain- nursing feels it's legitimate 4. Depression/insomnia: LCSW to evaluate and provide emotional support             -antipsychotic agents: n/a             -bupropion ER 150 mg q AM             -continue mirtazapine 30 mg q HS             -continue alprazolam 0.5 mg daily prn anxiety  -10/7 start melatonin PRN  10/22- pt remembers to ask for Xanax, however cannot call when incontinent 5. Neuropsych/cognition: This patient is capable of making decisions on her own behalf. 6. Skin/Wound Care: routine skin care checks 7. Dysphagia: routine Is and Os and follow-up chemistries             continue Dysphagia 2 diet/thin-SLP Need to enc fluids    9: Hypertension: continue to monitor;  avoid hypotension   Vitals:   09/03/22 1937 09/04/22 0444  BP: 125/62 119/77   Pulse: 89 77  Resp: 16 15  Temp: (!) 96.9 F (36.1 C) 97.9 F (36.6 C)  SpO2: 100% 100%   Cardizem CD '240mg'$  per day 10/24 controlled  10: Hyperlipidemia: LDL = 125, counsel regarding healthy diet             -  continue  ezetimibe 10 mg daily             -continue atorvastatin 90 mg daily 11: Tobacco use: cessation counseling             -wean nicotine patch 21 mg -->58mg 12. Constipation        Senna S BID , continue BID miralax  10/23- BM x 2 yesterday  13. GERD             -was not taking PPI at home, was taking famotidine qhs  14.  Dysarthria improving continue SLP  -On dys 2 diet with thin liquids  15. Insomnia- remeron '30mg'$  qhs      LOS: 34 days A FACE TO FACE EVALUATION WAS PERFORMED  ACharlett Blake10/24/2023, 7:46 AM

## 2022-09-04 NOTE — Progress Notes (Signed)
Physical Therapy Session Note  Patient Details  Name: Kara Bailey MRN: [id number] Date of Birth: [birth year]  Today's Date: 09/04/2022 PT Individual Time: 1027-2536 PT Individual Time Calculation (min): 39 min   Short Term Goals: Week 4:  PT Short Term Goal 1 (Week 4): STG=LTG due to ELOS PT Short Term Goal 1 - Progress (Week 4): Progressing toward goal Week 5:  PT Short Term Goal 1 (Week 5): STG=LTG due to ELOS  Skilled Therapeutic Interventions/Progress Updates:  Patient seated upright in w/c on entrance to room. Patient alert and agreeable to PT session. Has texted dtr to find out where she is. Dtr does not arrive for session of family education.   Patient with no pain complaint at start of session.  Therapeutic Activity: Transfers/ NMR: Pt performed lateral scoot and squat pivot transfers throughout session toward R side with ModA without hand support to pull. With ability to pull at bed rail is able to perform with MinA. Using w/c armrest, pt performs with Mod/MinA. Performed x4 back and forth with different hand positions. Provided verbal cues for continuing scoot for improved positioning as well as for use of weight shift over hand placement.   Educated pt on use of pressure to L bicep tendon in order to help release tension and improve arm positioning in arm trough on w/c.  NMR performed for improvements in motor control and coordination, balance, sequencing, judgement, and self confidence/ efficacy in performing all aspects of mobility at highest level of independence.   Patient seated upright in w/c at end of session with brakes locked, belt alarm set, and all needs within reach.   Therapy Documentation Precautions:  Precautions Precautions: Fall Precaution Comments: L hemi. expressive aphasia Restrictions Weight Bearing Restrictions: No General:   Vital Signs: Therapy Vitals Temp: 97.6 F (36.4 C) Pulse Rate: 96 Resp: 16 BP: 125/75 Patient Position (if  appropriate): Sitting Oxygen Therapy SpO2: 100 % O2 Device: Room Air Pain:  No pain related this session.    Therapy/Group: Individual Therapy  Alger Simons PT, DPT, CSRS 09/04/2022, 4:19 PM

## 2022-09-05 NOTE — Patient Care Conference (Signed)
Inpatient RehabilitationTeam Conference and Plan of Care Update Date: 09/05/2022   Time: 10:05 AM    Patient Name: Kara Bailey      Medical Record Number: [id number]  Date of Birth: [birth year] Sex: Female         Room/Bed: 4W11C/4W11C-01 Payor Info: Payor: [employer] MEDICARE / Plan: [place] HMO / Product Type: *No Product type* /    Admit Date/Time:  08/01/2022  6:21 PM  Primary Diagnosis:  Ischemic stroke [facility])  Hospital Problems: Principal Problem:   Ischemic stroke [facility])    Expected Discharge Date: Expected Discharge Date:  (Pending equipment)  Team Members Present: Physician leading conference: Dr. Alysia Penna Social Worker Present: Erlene Quan, [place] Nurse Present: Dorien Chihuahua, RN PT Present: Barrie Folk, PT OT Present: Meriel Pica, OT SLP Present: Sherren Kerns, SLP PPS Coordinator present : Gunnar Fusi, SLP     Current Status/Progress Goal Weekly Team Focus  Bowel/Bladder   Cont/ with incont episodes  timed toileting to reduce incot episodes  assist to the toilet and personal care q 2 hours   Swallow/Nutrition/ Hydration   dys 2 diet with thin liquids  mod I  ongoing dysphagia 3 trials as tolerated, education prior to discharge   ADL's   min UB self care, mod LB self care, min -mod squat pivot transfers, close S with static stand with RUE support, no change in RUE - non functional  minimal assist except for LTG of LB dressing and toileting  mod A  pt/fam education, functional mobility, NMR   Mobility   min assist bed mobility to the R. min-mod assist for squat pivot transfer. supervision assist trasnfers to [facility]. Supervision assist WC mobliity  Min/ ModA for mobiltiy/ transfers; MaxA for ambulation  Family education. improved indepence with trasnfer, WC Mobility, and bed mobility.   Communication   min-to-mod A speech intelligibility  mod I multimodal communication; min-to-mod A for speech strategies at the word level  carry over of  speech strategies, breath support techniques   Safety/Cognition/ Behavioral Observations            Pain   no pain  Remain pain free  assess pain q shift and education on pain   Skin   Skin intact  Personal hygiene and skin care to keep skin intact  assess q shift     Discharge Planning:  Patient discharging home with daughter when DME in place   Team Discussion: Patient with tone in left UE. BP controlled.  Patient on target to meet rehab goals: yes, currently needs supervision using a stedy. Completes bed mobility with min assist going to the left and mod assist going to the right.  Completes slide board and squat pivot transfers with min - mod assist. Continue to note significant oral deficits; maintain dysphagia diet.  *See Care Plan and progress notes for long and short-term goals.   Revisions to Treatment Plan:  Custom w/c consult D3 trials Pills whole trial   Teaching Needs: Safety, medications, secondary risk management, dietary modifications, transfers, toileting, etc.   Current Barriers to Discharge: Decreased caregiver support and Home enviroment access/layout  Possible Resolutions to Barriers: Family education  Video placed on patient's phone for reference with transfers Non emergent transport to home     Medical Summary Current Status: Left shoulder pain, tone LUE  Barriers to Discharge: Medical stability;Decreased family/caregiver support;Home enviroment access/layout   Possible Resolutions to [employer] Focus: Needs to work on swallowing pills whole to allow use of controlled release  meds, needs famil training, equipment delivered to home setting   Continued Need for Acute Rehabilitation Level of Care: The patient requires daily medical management by a physician with specialized training in physical medicine and rehabilitation for the following reasons: Direction of a multidisciplinary physical rehabilitation program to maximize functional independence  : Yes Medical management of patient stability for increased activity during participation in an intensive rehabilitation regime.: Yes Analysis of laboratory values and/or radiology reports with any subsequent need for medication adjustment and/or medical intervention. : Yes   I attest that I was present, lead the team conference, and concur with the assessment and plan of the team.   Dorien Chihuahua B 09/05/2022, 3:54 PM

## 2022-09-05 NOTE — Progress Notes (Signed)
Speech Language Pathology Daily Session Note  Patient Details  Name: Kara Bailey MRN: [id number] Date of Birth: 08/09/1956  Today's Date: 09/05/2022 SLP Individual Time: 0803-0900 SLP Individual Time Calculation (min): 57 min  Short Term Goals: Week 4: SLP Short Term Goal 1 (Week 4): STG=LTG due to ELOS SLP Short Term Goal 1 - Progress (Week 4): Progressing toward goal  Skilled Therapeutic Interventions: Skilled ST treatment focused on speech goals. Pt was greeted in bed on arrival and in good spirits. SLP facilitated session by providing mod A verbal/visual/tactile cues for diaphragmatic breathing exercises in supine position with guided breathing (inhale for 3 seconds, exhale for 3 seconds). Elevated HOB and then facilitated speech production tasks at the word level with mod A verbal cues to increase breath support, increase vocal intensity, and max A for self monitoring/repair. SLP facilitated speech production at the phrase level with overall mod A to implement previously mentioned strategies, in addition to pause between words to obtain additional breath support. Pt continues to benefit from taking sips of water periodically to improve vocal clarity and overall intensity. Pt progresses well during the course of a session with consistent reinforcement from SLP, however still exhibiting minimal carry over between sessions. Have provided pt with handouts for HEP between sessions, as well as handouts detailing speech strategies and breathing techniques to promote improved carry over. SLP continues to refer to these handouts during sessions as a ongoing resource for patient. Patient was left in bed with alarm activated and immediate needs within reach at end of session. Continue per current plan of care.      Pain Pain Assessment Pain Scale: 0-10 Pain Score: 0-No pain  Therapy/Group: Individual Therapy  Kwaku Mostafa T Rohnan Bartleson 09/05/2022, 9:01 AM

## 2022-09-05 NOTE — Progress Notes (Addendum)
PROGRESS NOTE   Subjective/Complaints:  Informed pt of d/c plan changed to home setting with family assist , pt would like to go to her place but no 24/7 assist there , per SW has more support at son's place  ROS:  limited due to severe dysarthria  Objective:   No results found. Recent Labs    09/03/22 0518  WBC 7.5  HGB 11.4*  HCT 36.3  PLT 281      Recent Labs    09/03/22 0518  NA 141  K 4.2  CL 110  CO2 25  GLUCOSE 91  BUN 12  CREATININE 1.00  CALCIUM 9.6       Intake/Output Summary (Last 24 hours) at 09/05/2022 0853 Last data filed at 09/05/2022 0800 Gross per 24 hour  Intake 952 ml  Output --  Net 952 ml         Physical Exam: Vital Signs Blood pressure 118/69, pulse 76, temperature 98 F (36.7 C), temperature source Oral, resp. rate 14, height 5' 5.98" (1.676 m), weight 59.4 kg, SpO2 100 %.   General: No acute distress Mood and affect are appropriate Heart: Regular rate and rhythm no rubs murmurs or extra sounds Lungs: Clear to auscultation, breathing unlabored, no rales or wheezes Abdomen: Positive bowel sounds, soft nontender to palpation, nondistended Extremities: No clubbing, cyanosis, or edema Skin: No evidence of breakdown, no evidence of rash  motor strength is 5/5 in right , 0/5 left deltoid, bicep, tricep, grip, hip flexor, knee extensors, ankle dorsiflexor and plantar flexor Trace Left hip/knee ext synergy  Tone MAS 3 in left pec and biceps Sensory exam intact LT left foot and on RIght side Musculoskeletal: pain with Left shoulder ER and abd    No joint swelling or tenderness noted   Assessment/Plan: 1. Functional deficits which require 3+ hours per day of interdisciplinary therapy in a comprehensive inpatient rehab setting. Physiatrist is providing close team supervision and 24 hour management of active medical problems listed below. Physiatrist and rehab team continue to  assess barriers to discharge/monitor patient progress toward functional and medical goals  Care Tool:  Bathing    Body parts bathed by patient: Abdomen, Chest, Front perineal area, Right upper leg, Left upper leg, Left arm, Face, Right arm, Right lower leg, Left lower leg (used long handled sponge)   Body parts bathed by helper: Right arm, Buttocks, Left lower leg, Right lower leg     Bathing assist Assist Level: Minimal Assistance - Patient > 75%     Upper Body Dressing/Undressing Upper body dressing   What is the patient wearing?: Bra, Pull over shirt    Upper body assist Assist Level: Minimal Assistance - Patient > 75%    Lower Body Dressing/Undressing Lower body dressing      What is the patient wearing?: Incontinence brief     Lower body assist Assist for lower body dressing: Moderate Assistance - Patient 50 - 74%     Toileting Toileting    Toileting assist Assist for toileting: Moderate Assistance - Patient 50 - 74%     Transfers Chair/bed transfer  Transfers assist  Chair/bed transfer activity did not occur: Safety/medical concerns  Chair/bed transfer assist level: Maximal Assistance - Patient 25 - 49%     Locomotion Ambulation   Ambulation assist   Ambulation activity did not occur: Safety/medical concerns  Assist level: Maximal Assistance - Patient 25 - 49% Assistive device: Walker-hemi Max distance: 65 ft   Walk 10 feet activity   Assist  Walk 10 feet activity did not occur: Safety/medical concerns  Assist level: Maximal Assistance - Patient 25 - 49% Assistive device: Walker-hemi   Walk 50 feet activity   Assist Walk 50 feet with 2 turns activity did not occur: Safety/medical concerns  Assist level: Maximal Assistance - Patient 25 - 49% Assistive device: Walker-hemi    Walk 150 feet activity   Assist Walk 150 feet activity did not occur: Safety/medical concerns         Walk 10 feet on uneven surface  activity   Assist  Walk 10 feet on uneven surfaces activity did not occur: Safety/medical concerns         Wheelchair     Assist Is the patient using a wheelchair?: Yes Type of Wheelchair: Manual    Wheelchair assist level: Supervision/Verbal cueing Max wheelchair distance: 100    Wheelchair 50 feet with 2 turns activity    Assist        Assist Level: Supervision/Verbal cueing   Wheelchair 150 feet activity     Assist      Assist Level: Supervision/Verbal cueing   Blood pressure 118/69, pulse 76, temperature 98 F (36.7 C), temperature source Oral, resp. rate 14, height 5' 5.98" (1.676 m), weight 59.4 kg, SpO2 100 %.  Medical Problem List and Plan: 1. 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 Formulized individual treatment plan based on medical history, underlying problem and comorbidities.  2.  Antithrombotics: -DVT/anticoagulation:  Pharmaceutical: cont Lovenox , doppler without evidence of DVT              -antiplatelet therapy: continue Plavix and aspirin as PTA 3. Pain Management: tylenol as needed  10/22- will add tramadol 50 mg q6 hours prn for severe pain- nursing feels it's legitimate 4. Depression/insomnia: LCSW to evaluate and provide emotional support             -antipsychotic agents: n/a             -bupropion ER 150 mg q AM             -continue mirtazapine 30 mg q HS             -continue alprazolam 0.5 mg daily prn anxiety  -10/7 start melatonin PRN  10/22- pt remembers to ask for Xanax, however cannot call when incontinent 5. Neuropsych/cognition: This  patient is capable of making decisions on her own behalf. 6. Skin/Wound Care: routine skin care checks 7. Dysphagia: routine Is and Os and follow-up chemistries             continue Dysphagia 2 diet/thin-SLP Need to enc fluids    9: Hypertension: continue to monitor;  avoid hypotension   Vitals:  09/04/22 2010 09/05/22 0516  BP: (!) 142/77 118/69  Pulse: 100 76  Resp: 18 14  Temp: 98.5 F (36.9 C) 98 F (36.7 C)  SpO2: 100% 100%   Cardizem CD 250m per day 10/24 controlled  10: Hyperlipidemia: LDL = 125, counsel regarding healthy diet             -continue  ezetimibe 10 mg daily             -continue atorvastatin 90 mg daily 11: Tobacco use: cessation counseling             -wean nicotine patch 21 mg -->152m 12. Constipation        Senna S BID , continue BID miralax  10/23- BM x 2 yesterday  13. GERD             -was not taking PPI at home, was taking famotidine qhs  14.  Dysarthria improving continue SLP  -On dys 2 diet with thin liquids  15. Insomnia- remeron 3031mhs      LOS: 35 days A FACE TO FACE EVALUATION WAS PERFORMED  AndCharlett Blake/25/2023, 8:53 AM

## 2022-09-05 NOTE — Progress Notes (Signed)
Pt tolerated PO meds one at a time in apple sauc without issues. No coughing or choking noted. No pocketing noted. Pt denies pain but ask for anxiety medication. Prn alprazolam given at this time. PT is sitting up in bed alert and oriented vitals are WNL and denies any shob c/p are increased resp at this time.

## 2022-09-05 NOTE — Progress Notes (Signed)
Patient ID: Kara Bailey, female   DOB: [birth year], 66 y.o.   MRN: [id number]  Patient DME in route for delivery. Discharge anticipated for tomorrow.

## 2022-09-05 NOTE — Progress Notes (Signed)
Physical Therapy Session Note  Patient Details  Name: Kara Bailey MRN: [id number] Date of Birth: 02-02-1956  Today's Date: 09/05/2022 PT Individual Time: 1132-1200 and 1531-1639   28 and 68 min   Short Term Goals:  Week 5:  PT Short Term Goal 1 (Week 5): STG=LTG due to ELOS  Skilled Therapeutic Interventions/Progress Updates:  Session 1  Pt received sitting in WC and agreeable to PT. Pt transported to rehab gym in [facility]. Gait training at rail in hall with max assist for improved stability through LLE in stance and increased weight shift to the R to allow advancement of the LLE. Performed 2 x 64f with improved posture noted on the second bout allowing improved hip extension on the LLE.  WC mobility in gym x 106fwith supervision assist to navigate 3 turns and avoid obstacles on floor .Patient returned to [facility] and [facility] reach and all needs met.      Session 2.  Pt received sitting in WC and agreeable to PT. [facility] with max assist of 1 to block knee in extension on the L and facilitate adequate weight shift to advance the LLE. Pt able to advanse LLE throughout. RUE supported on HW. Cues for improved trunk/pelvic weight shift R with fatigue.   Nustep reciprocal movement training 2 x 4 min with cues for full ROM on the LLE with thigh support on the L. Performed on Level 3 and 4.   Transfer training to [facility] to simulate home access with mod assist x 4 with cues for improved squat pivot technique.  Patient returned to room and left [facility] in reach and all needs met.        Therapy Documentation Precautions:  Precautions Precautions: Fall Precaution Comments: L hemi. expressive aphasia Restrictions Weight Bearing Restrictions: No    Vital Signs: Therapy Vitals Temp: 97.7 F (36.5 C) Temp Source: Oral Pulse Rate: 81 Resp: 16 BP: 126/62 Oxygen Therapy SpO2: 96 % O2  Device: Room Air Pain: Pain Assessment Pain Scale: 0-10 Pain Score: 0-No pain    Therapy/Group: Individual Therapy  AuLorie Phenix0/25/2023, 3:35 PM

## 2022-09-05 NOTE — Progress Notes (Signed)
Patient ID: Kara Bailey, female   DOB: 1956/08/29, 66 y.o.   MRN: [id number] Team Conference Report to Patient/Family  Team Conference discussion was reviewed with the patient and caregiver, including goals, any changes in plan of care and target discharge date.  Patient and caregiver express understanding and are in agreement.  The patient has a target discharge date of  (Pending equipment).  SW spoke with patient son to discuss discharge and conference. Patient reports family has not been contacted. Patient son reports the discharge address for the DME is now the patients home:  [address] [place] [address]  Dyanne Iha 09/05/2022, 2:43 PM

## 2022-09-05 NOTE — Progress Notes (Signed)
Occupational Therapy Session Note  Patient Details  Name: Kara Bailey MRN: [id number] Date of Birth: Dec 01, 1955  Today's Date: 09/05/2022 OT Individual Time: 0922-1000 OT Individual Time Calculation (min): 38 min    Short Term Goals: Week 5:  OT Short Term Goal 1 (Week 5): STGs = LTGs  Skilled Therapeutic Interventions/Progress Updates:    Pt seen this session for transfer training to reinforce skills needed for home. Pt's daughter called and asked when she could come for training. Told her she has a 330 PT session she could attend.  Pt taken tub room which has similar set up as her home bathroom.  Pt practiced squat pivot to L to drop arm BSC over toilet, standing with hemiwalker,  and returning to sit then transferring back to w/c to her R. Pt did extremely well with her transfers and standing at a min A level, CGA with standing with hemiwalker.  Repeated entire process a 2nd time and had rehab tech videoed her for her family to reference later.  Will ask PT to review this with daughter during the PT session this afternoon. Pt returned to room with all needs met.    Therapy Documentation Precautions:  Precautions Precautions: Fall Precaution Comments: L hemi. expressive aphasia Restrictions Weight Bearing Restrictions: No  Vital Signs: Therapy Vitals Temp: 98 F (36.7 C) Temp Source: Oral Pulse Rate: 76 Resp: 14 BP: 118/69 Patient Position (if appropriate): Lying Oxygen Therapy SpO2: 100 % O2 Device: Room Air Pain: Pain Assessment Pain Scale: 0-10 Pain Score: 0-No pain ADL: ADL Eating: Set up Grooming: Independent Where Assessed-Grooming: Sitting at sink Upper Body Bathing: Supervision/safety, Minimal cueing (using a long sponge to reach RUE) Where Assessed-Upper Body Bathing: Shower Lower Body Bathing: Minimal assistance (A to wash bottom) Where Assessed-Lower Body Bathing: Shower Upper Body Dressing: Minimal assistance Where Assessed-Upper Body Dressing:  Edge of bed Lower Body Dressing: Maximal assistance (to donn socks and manage pants over hips and thread to L LE, donn L shoe and AFO and tie B shoes) Where Assessed-Lower Body Dressing: Edge of bed Toileting: Maximal assistance Where Assessed-Toileting: Glass blower/designer: Moderate assistance Toilet Transfer Method: Squat pivot Toilet Transfer Equipment: Grab bars Tub/Shower Transfer: Maximal assistance, Moderate assistance Tub/Shower Transfer Method: Sit pivot Tub/Shower Equipment: Facilities manager: Environmental education officer Method: Education officer, environmental: Radio broadcast assistant, Grab bars ADL Comments: Pt able to recall hemi-techniques with grooming tasks learned in previous OT session.   Therapy/Group: Individual Therapy  Paralee Pendergrass 09/05/2022, 8:39 AM

## 2022-09-06 ENCOUNTER — Other Ambulatory Visit: Payer: Self-pay | Admitting: Physician Assistant

## 2022-09-06 MED ORDER — ATORVASTATIN CALCIUM 80 MG PO TABS: 80.0000 mg | ORAL_TABLET | Freq: Every day | ORAL | 0 refills | Status: DC

## 2022-09-06 MED ORDER — MELATONIN 3 MG PO TABS: 3.0000 mg | ORAL_TABLET | Freq: Every day | ORAL | 0 refills | Status: DC

## 2022-09-06 MED ORDER — SENNOSIDES-DOCUSATE SODIUM 8.6-50 MG PO TABS: 1.0000 | ORAL_TABLET | Freq: Two times a day (BID) | ORAL | Status: DC

## 2022-09-06 MED ORDER — ALPRAZOLAM 0.5 MG PO TABS: 0.5000 mg | ORAL_TABLET | Freq: Every day | ORAL | 0 refills | Status: DC | PRN

## 2022-09-06 MED ORDER — POLYETHYLENE GLYCOL 3350 17 G PO PACK: 17.0000 g | PACK | Freq: Every day | ORAL | 0 refills | Status: AC | PRN

## 2022-09-06 MED ORDER — DILTIAZEM HCL ER COATED BEADS 240 MG PO CP24: 240.0000 mg | ORAL_CAPSULE | Freq: Every day | ORAL | 0 refills | Status: DC

## 2022-09-06 MED ORDER — ACETAMINOPHEN 325 MG PO TABS: 325.0000 mg | ORAL_TABLET | ORAL | Status: DC | PRN

## 2022-09-06 MED ORDER — TRAMADOL HCL 50 MG PO TABS: 50.0000 mg | ORAL_TABLET | Freq: Four times a day (QID) | ORAL | 0 refills | Status: DC | PRN

## 2022-09-06 MED ORDER — TIZANIDINE HCL 2 MG PO TABS: 2.0000 mg | ORAL_TABLET | Freq: Every day | ORAL | 0 refills | Status: DC

## 2022-09-06 NOTE — Progress Notes (Addendum)
Patient ID: Kara Bailey, female   DOB: October 09, 1956, 66 y.o.   MRN: [id number]  PTAR arranged after lunch for 1 PM Patient informed.  Discharge packet left at nursing station.

## 2022-09-06 NOTE — Progress Notes (Signed)
Physical Therapy Discharge Summary  Patient Details  Name: Kara Bailey MRN: [id number] Date of Birth: 16-Mar-1956  Date of Discharge from PT service:September 06, 2022  Today's Date: 09/06/2022 PT Individual Time: 1110-1204 and 1630-1730 PT Individual Time Calculation (min): 54 min and 60 min    Patient has met 9 of 9 long term goals due to improved activity tolerance, improved balance, improved postural control, increased strength, increased range of motion, decreased pain, ability to compensate for deficits, functional use of  left lower extremity, improved attention, and improved awareness.  Patient to discharge at a wheelchair level Rudy for tranfers to and from [facility]   Patient's care partner requires assistance to provide the necessary physical assistance at discharge.  Reasons goals not met: All PT goals met   Recommendation:  Patient will benefit from ongoing skilled PT services in home health setting to continue to advance safe functional mobility, address ongoing impairments in balance, strenth, transfers, bed mobility, coordination, awareness, and minimize fall risk.  Equipment: WC. HW. Hospital bed   Reasons for discharge: treatment goals met and discharge from hospital  Patient/family agrees with progress made and goals achieved: Yes   PT treatment:  Session 1.   Pt received sitting in WC and agreeable to PT. PT instructed pt in Grad day assessment to measure progress toward goals. See below for details. CARETool mobility assessment  also completed; see CAREtool tab in navigator for details. Pt performed gait training with max assist x 29f with max cues for management of the LLE and adeqaute weight shift to advance the LLE. WC mobility with supervision assist through unit x 2051fwith hemitechnique. Car transfer performed x 2 with mod assist from PT for squat pivot and lateral scoot on slide board with cues for wieght shift and head/hips relationship. No family present  for education  Patient returned to room and left sitting in [facility] call bell in reach and all needs met.    Session 2.  Pt received sitting in WC and agreeable to PT. WC mobility through hall with supervision assist for safety and navigation through tight space in rehab gym. Squat pivot transfer to and from nustep with mod assist uphill onto nustep and min assist to return to WCBapt[facility] FlaglerNustep reciprocal movement training with L thigh support 2 min x 6 with therapeutic rest break between bout. Performed on level 3-4 throughout with cues for activation on the L side. Noted to have improve hip extension throughout training on this day. Gait training performed with HW and max assist x 4524fith cues for posture, weight shift and improved hip/knee extension on the L side to prevent buckling. Able to prevent buckle for 25% of steps on this day.  Patient returned to room and left sitting in [facility] in reach and all needs met.     Pt received sitting in WC and agreeable to PT. Pt performed      PT Discharge Precautions/Restrictions Restrictions Weight Bearing Restrictions: No Vital Signs Therapy Vitals Temp: (!) 97.5 F (36.4 C) Pulse Rate: 98 Resp: 18 BP: (!) 152/68 Patient Position (if appropriate): Sitting Oxygen Therapy SpO2: 100 % O2 Device: Room Air Pain Pain Assessment Pain Scale: 0-10 Pain Score: 0-No pain Faces Pain Scale: No hurt Pain Interference Pain Interference Pain Effect on Sleep: 1. Rarely or not at all Pain Interference with Therapy Activities: 1. Rarely or not at all Pain Interference with Day-to-Day Activities: 1. Rarely or not at all Vision/Perception  Vision -  Assessment Eye Alignment: Within Functional Limits Ocular Range of Motion: Within Functional Limits Alignment/Gaze Preference: Within Defined Limits Tracking/Visual Pursuits: Decreased smoothness of eye movement to LEFT inferior field;Decreased smoothness of eye movement to LEFT superior  field;Decreased smoothness of vertical tracking;Requires cues, head turns, or add eye shifts to track Saccades: Within functional limits Perception Perception: Within Functional Limits Praxis Praxis: Intact  Cognition Overall Cognitive Status: Within Functional Limits for tasks assessed Arousal/Alertness: Awake/alert Orientation Level: Oriented X4 Year: 2023 Month: October Day of Week: Correct Attention: Focused;Selective;Sustained Focused Attention: Appears intact Sustained Attention: Appears intact Selective Attention: Appears intact Memory: Appears intact Awareness: Appears intact Problem Solving: Appears intact Safety/Judgment: Appears intact Sensation Sensation Light Touch: Impaired by gross assessment Peripheral sensation comments: reports decreased appriciation to light touch in the LUE, WFL in BLE Proprioception Impaired Details: Impaired LUE Additional Comments: able to detect stimuli, reports decreased appreciation to deep pressure and light touch Coordination Fine Motor Movements are Fluid and Coordinated: No Coordination and Movement Description: dense L hemiplegia with hypertone in LUE Finger Nose Finger Test: unable to perform on the LUE Heel Shin Test: unable to perform on the LLE Motor  Motor Motor: Hemiplegia Motor - Skilled Clinical Observations: dense LUE and LLE hemiplegia Motor - Discharge Observations: dense LUE and LLE hemiplegia  Mobility Bed Mobility Bed Mobility: Supine to Sit;Sit to Supine;Rolling Right;Rolling Left Rolling Right: Moderate Assistance - Patient 50-74% Rolling Left: Contact Guard/Touching assist Supine to Sit: Minimal Assistance - Patient > 75% Sit to Supine: Minimal Assistance - Patient > 75% Transfers Transfers: Sit to Stand;Stand to Sit;Squat Pivot Transfers Sit to Stand: Contact Guard/Touching assist (in stedy) Stand to Sit: Contact Guard/Touching assist (in stedy) Squat Pivot Transfers: Minimal Assistance - Patient >  75% Transfer (Assistive device): Hemi-walker Transfer via Lift Equipment: Probation officer Ambulation: Yes Gait Assistance: Maximal Assistance - Patient 25-49% Gait Distance (Feet): 55 Feet Assistive device: Hemi-walker;Other (Comment) Gait Assistance Details: Verbal cues for precautions/safety;Verbal cues for sequencing;Verbal cues for technique;Verbal cues for gait pattern;Verbal cues for safe use of DME/AE;Tactile cues for posture;Tactile cues for sequencing;Tactile cues for weight shifting;Tactile cues for placement;Manual facilitation for weight shifting;Manual facilitation for placement Gait Gait: Yes Gait Pattern: Impaired Gait Pattern: Step-to pattern;Decreased step length - left;Decreased step length - right;Decreased stance time - left;Decreased stride length;Poor foot clearance - left Stairs / Additional Locomotion Stairs: No Wheelchair Mobility Wheelchair Mobility: Yes Wheelchair Assistance: Chartered loss adjuster: Right upper extremity;Right lower extremity Wheelchair Parts Management: Needs assistance Distance: 116f  Trunk/Postural Assessment  Cervical Assessment Cervical Assessment: Within Functional Limits Thoracic Assessment Thoracic Assessment: Exceptions to WEndoscopy Center Of Inland Empire LLCLumbar Assessment Lumbar Assessment: Exceptions to WBeacon Surgery CenterPostural Control Postural Control: Deficits on evaluation Trunk Control: strong L lean in standing, improved from baseline. decreased rotation in the L side trunk with mild convex to the L  Balance Balance Balance Assessed: Yes Static Sitting Balance Static Sitting - Level of Assistance: 7: Independent Dynamic Sitting Balance Dynamic Sitting - Level of Assistance: 5: Stand by assistance Static Standing Balance Static Standing - Level of Assistance: 4: Min assist;3: Mod assist (CGA) Dynamic Standing Balance Dynamic Standing - Level of Assistance: 2: Max assist Extremity Assessment      RLE Assessment RLE  Assessment: Within Functional Limits LLE Assessment LLE Assessment: Exceptions to WPractice Partners In Healthcare IncGeneral Strength Comments: hip/knee extension 2/5 in synergy. hip flexion 1+/5. 1/5 ankle PF   [name]10/26/2023, 2:13 PM

## 2022-09-06 NOTE — Progress Notes (Signed)
Inpatient Rehabilitation Discharge Medication Review by a Pharmacist  A complete drug regimen review was completed for this patient to identify any potential clinically significant medication issues.  High Risk Drug Classes Is patient taking? Indication by Medication  Antipsychotic No   Anticoagulant No   Antibiotic No   Opioid Yes Pain: tramadol  Antiplatelet Yes Stroke prophylaxis: Aspirin, Plavix  Hypoglycemics/insulin No   Vasoactive Medication Yes Hypertension: diltiazem  Chemotherapy No   Other Yes Pain: Tylenol prn HLD: Lipitor, Zetia Insomnia/anxiety: Xanax Depression: Bupropion Insomnia/depression: Mirtazapine Insomnia: Melatonin GERD: Famotidine Constipation: Miralax prn, Senokot  Vitamin/supplements: MVI Muscle spasm/pain: Tizanidine Nicotine dependence: Nicoderm     Type of Medication Issue Identified Description of Issue Recommendation(s)  Drug Interaction(s) (clinically significant)     Duplicate Therapy     Allergy     No Medication Administration End Date     Incorrect Dose     Additional Drug Therapy Needed     Significant med changes from prior encounter (inform family/care partners about these prior to discharge).    Other       Clinically significant medication issues were identified that warrant physician communication and completion of prescribed/recommended actions by midnight of the next day:  No   Time spent performing this drug regimen review (minutes):  20   Thank you for allowing [place] to participate in this patients care. Jens Som, PharmD 09/06/2022 11:11 AM  **Pharmacist phone directory can be found on [email or web address] listed under [place]**

## 2022-09-06 NOTE — Progress Notes (Addendum)
Inpatient Rehabilitation Care Coordinator Discharge Note   Patient Details  Name: BRIGGITTE BOLINE MRN: [id number] Date of Birth: 02/01/1956   Discharge location: Home  Length of Stay: 36 Days  Discharge activity level: Supervision  Home/community participation: Daughter, Son and S.O  Patient response BF:XOVANV Literacy - How often do you need to have someone help you when you read instructions, pamphlets, or other written material from your doctor or pharmacy?: Often  Patient response BT:YOMAYO Isolation - How often do you feel lonely or isolated from those around you?: Never  Services provided included: SW, Neuropsych, Pharmacy, TR, CM, RN, SLP, OT, PT, RD, MD  Financial Services:  Financial Services Utilized: [employer] [place] [employer]  Choices offered to/list presented to: patient  Follow-up services arranged:  [place]: [facility]         Patient response to transportation need: Is the patient able to respond to transportation needs?: Yes In the past 12 months, has lack of transportation kept you from medical appointments or from getting medications?: No In the past 12 months, has lack of transportation kept you from meetings, work, or from getting things needed for daily living?: No    Comments (or additional information):  Patient/Family verbalized understanding of follow-up arrangements:  Yes  Individual responsible for coordination of the follow-up plan: Ricky  Confirmed correct DME delivered: Dyanne Iha 09/06/2022    Dyanne Iha

## 2022-09-06 NOTE — Plan of Care (Addendum)
  Problem: RH Expression Communication Goal: LTG Patient will express needs/wants via multi-modal(SLP) Description: LTG:  Patient will express needs/wants via multi-modal communication (gestures/written, etc) with cues (SLP) Outcome: Completed/Met Goal: LTG Patient will increase speech intelligibility (SLP) Description: LTG: Patient will increase speech intelligibility at word/phrase/conversation level with cues, % of the time (SLP) Outcome: Completed/Met   Problem: RH Swallowing Goal: LTG Patient will consume least restrictive diet using compensatory strategies with assistance (SLP) Description: LTG:  Patient will consume least restrictive diet using compensatory strategies with assistance (SLP) Outcome: Completed/Met Goal: LTG Pt will demonstrate functional change in swallow as evidenced by bedside/clinical objective assessment (SLP) Description: LTG: Patient will demonstrate functional change in swallow as evidenced by bedside/clinical objective assessment (SLP) Outcome: Completed/Met.

## 2022-09-06 NOTE — Progress Notes (Signed)
Occupational Therapy Session Note  Patient Details  Name: Kara Bailey MRN: [id number] Date of Birth: 07-31-1956  Today's Date: 09/06/2022 OT Individual Time: 4098-1191 OT Individual Time Calculation (min): 70 min    Short Term Goals: Week 5:  OT Short Term Goal 1 (Week 5): STGs = LTGs  Skilled Therapeutic Interventions/Progress Updates:    Pt received in bed and agreeable to a shower. Her brief was soaked with urine and pt declined needing to toilet further. Using hospital bed functions, pt able to sit to EOB with S and scoot to EOB.  She completed sq pivot to L to wc with min A and then continued to complete shower transfers to bench with min A using bar.  Pt only needed 1 cue today for head position.  She did well with using hemi strategies (long sponge for reaching feet and R arm) in shower and with donning clothing.  She recalls techniques without cues.   She was able to complete all sit to stands and static stands this am with CGA.   After completing self care, pt worked on her tone reduction strategies of trunk rotations and body on arm stretches.  This did reduce tone in arm to enable her to achieve full ROM of elbow.  Pt worked on Stage manager of L elbow by holding hands on knee.   Reminded pt to not try to shower at home until [facility] evaluates safety of transfers.   Pt resting in wc with all needs met.   Therapy Documentation Precautions:  Precautions Precautions: Fall Precaution Comments: L hemi. expressive aphasia Restrictions Weight Bearing Restrictions: No      Pain: no c/o pain    ADL: ADL Eating: Modified independent Grooming: Modified independent Where Assessed-Grooming: Sitting at sink Upper Body Bathing: Supervision/safety Where Assessed-Upper Body Bathing: Shower Lower Body Bathing: Minimal assistance (A to wash bottom) Where Assessed-Lower Body Bathing: Shower Upper Body Dressing: Minimal assistance (min for bra, set up for shirt) Where Assessed-Upper  Body Dressing: Wheelchair Lower Body Dressing: Moderate assistance Where Assessed-Lower Body Dressing: Wheelchair Toileting: Minimal assistance Where Assessed-Toileting: Glass blower/designer: Psychiatric nurse Method: Engineer, water: Drop arm bedside commode Tub/Shower Transfer: Minimal assistance Tub/Shower Transfer Method: Sit pivot Tub/Shower Equipment: Radio broadcast assistant, Energy manager: Environmental education officer Method: Education officer, environmental: Radio broadcast assistant, Grab bars ADL Comments: Pt has made good progress and recalls all hemi techniques without cues.  Therapy/Group: Individual Therapy  [place] 09/06/2022, 10:04 AM

## 2022-09-06 NOTE — Progress Notes (Signed)
PROGRESS NOTE   Subjective/Complaints:  Awaiting delivery of equipment although OT states pt could do without hospital bed x 1 night   ROS:  limited due to severe dysarthria  Objective:   No results found. No results for input(s): "WBC", "HGB", "HCT", "PLT" in the last 72 hours.    No results for input(s): "NA", "K", "CL", "CO2", "GLUCOSE", "BUN", "CREATININE", "CALCIUM" in the last 72 hours.     Intake/Output Summary (Last 24 hours) at 09/06/2022 0850 Last data filed at 09/06/2022 0800 Gross per 24 hour  Intake 1036 ml  Output --  Net 1036 ml         Physical Exam: Vital Signs Blood pressure 132/76, pulse 71, temperature 97.8 F (36.6 C), temperature source Oral, resp. rate 14, height 5' 5.98" (1.676 m), weight 57 kg, SpO2 100 %.   General: No acute distress Mood and affect are appropriate Heart: Regular rate and rhythm no rubs murmurs or extra sounds Lungs: Clear to auscultation, breathing unlabored, no rales or wheezes Abdomen: Positive bowel sounds, soft nontender to palpation, nondistended Extremities: No clubbing, cyanosis, or edema Skin: No evidence of breakdown, no evidence of rash  motor strength is 5/5 in right , 0/5 left deltoid, bicep, tricep, grip, hip flexor, knee extensors, ankle dorsiflexor and plantar flexor Trace Left hip/knee ext synergy  Tone MAS 3 in left pec and biceps Sensory exam intact LT left foot and on RIght side Musculoskeletal: pain with Left shoulder ER and abd    No joint swelling or tenderness noted   Assessment/Plan: 1. Functional deficits which require 3+ hours per day of interdisciplinary therapy in a comprehensive inpatient rehab setting. Physiatrist is providing close team supervision and 24 hour management of active medical problems listed below. Physiatrist and rehab team continue to assess barriers to discharge/monitor patient progress toward functional and medical  goals  Care Tool:  Bathing    Body parts bathed by patient: Abdomen, Chest, Front perineal area, Right upper leg, Left upper leg, Left arm, Face, Right arm, Right lower leg, Left lower leg (used long handled sponge)   Body parts bathed by helper: Right arm, Buttocks, Left lower leg, Right lower leg     Bathing assist Assist Level: Minimal Assistance - Patient > 75%     Upper Body Dressing/Undressing Upper body dressing   What is the patient wearing?: Bra, Pull over shirt    Upper body assist Assist Level: Minimal Assistance - Patient > 75%    Lower Body Dressing/Undressing Lower body dressing      What is the patient wearing?: Incontinence brief     Lower body assist Assist for lower body dressing: Moderate Assistance - Patient 50 - 74%     Toileting Toileting    Toileting assist Assist for toileting: Moderate Assistance - Patient 50 - 74%     Transfers Chair/bed transfer  Transfers assist  Chair/bed transfer activity did not occur: Safety/medical concerns  Chair/bed transfer assist level: Maximal Assistance - Patient 25 - 49%     Locomotion Ambulation   Ambulation assist   Ambulation activity did not occur: Safety/medical concerns  Assist level: Maximal Assistance - Patient 25 - 49% Assistive device:  Walker-hemi Max distance: 65 ft   Walk 10 feet activity   Assist  Walk 10 feet activity did not occur: Safety/medical concerns  Assist level: Maximal Assistance - Patient 25 - 49% Assistive device: Walker-hemi   Walk 50 feet activity   Assist Walk 50 feet with 2 turns activity did not occur: Safety/medical concerns  Assist level: Maximal Assistance - Patient 25 - 49% Assistive device: Walker-hemi    Walk 150 feet activity   Assist Walk 150 feet activity did not occur: Safety/medical concerns         Walk 10 feet on uneven surface  activity   Assist Walk 10 feet on uneven surfaces activity did not occur: Safety/medical concerns          Wheelchair     Assist Is the patient using a wheelchair?: Yes Type of Wheelchair: Manual    Wheelchair assist level: Supervision/Verbal cueing Max wheelchair distance: 100    Wheelchair 50 feet with 2 turns activity    Assist        Assist Level: Supervision/Verbal cueing   Wheelchair 150 feet activity     Assist      Assist Level: Supervision/Verbal cueing   Blood pressure 132/76, pulse 71, temperature 97.8 F (36.6 C), temperature source Oral, resp. rate 14, height 5' 5.98" (1.676 m), weight 57 kg, SpO2 100 %.  Medical Problem List and Plan: 1. 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  2.  Antithrombotics: -DVT/anticoagulation:  Pharmaceutical: cont Lovenox , doppler without evidence of DVT              -antiplatelet therapy: continue Plavix and aspirin as PTA 3. Pain Management: tylenol as needed  10/22- will add tramadol 50 mg q6 hours prn for severe pain- nursing feels it's legitimate 4. Depression/insomnia: LCSW to evaluate and provide emotional support             -antipsychotic agents: n/a             -bupropion ER 150 mg q AM             -continue mirtazapine 30 mg q HS             -continue alprazolam 0.5 mg daily prn anxiety  -10/7 start melatonin PRN  10/22- pt remembers to ask for Xanax, however cannot call when incontinent 5. Neuropsych/cognition: This patient is capable of making decisions on her own behalf. 6. Skin/Wound Care: routine skin care checks 7. Dysphagia: routine Is and Os and follow-up chemistries             continue Dysphagia 2 diet/thin-SLP Need to enc fluids    9: Hypertension: continue to monitor;  avoid hypotension   Vitals:   09/05/22 1953 09/06/22 0440  BP: 124/88 132/76   Pulse: 89 71  Resp: 16 14  Temp: 98.5 F (36.9 C) 97.8 F (36.6 C)  SpO2: 99% 100%   Cardizem CD '240mg'$  per day 10/26 controlled swallowing pills whole  10: Hyperlipidemia: LDL = 125, counsel regarding healthy diet             -continue  ezetimibe 10 mg daily             -  continue atorvastatin 90 mg daily 11: Tobacco use: cessation counseling             -wean nicotine patch 21 mg -->76mg 12. Constipation        Senna S BID , continue BID miralax  10/23- BM x 2 yesterday  13. GERD             -was not taking PPI at home, was taking famotidine qhs  14.  Dysarthria improving continue SLP  -On dys 2 diet with thin liquids  15. Insomnia- remeron '30mg'$  qhs      LOS: 36 days A FACE TO FACE EVALUATION WAS PERFORMED  ACharlett Blake10/26/2023, 8:50 AM

## 2022-09-06 NOTE — Progress Notes (Addendum)
Patient ID: ZANETA LIGHTCAP, female   DOB: 07/06/1956, 66 y.o.   MRN: [id number]  SW informed patient family has requested to have DME delivered on Saturday. Sw left VM for patient son informing him that we would have to continue with discharge due to multiple attempt deliveries with [employer]. Patient will discharge home with PTAR.  9:23AM: Sw received follow up from patient son. Sw informed patient son transportation will be arranged between 10-12. Patient son reports DME will not be in the home until Saturday. Sw informed patient son that DME was requested to be delivered to the home on Saturday VS. Yesterday and due to multiple attempt deliveries we will have to continue with discharge.  Patient son expressed understanding and agreed.  Son reports that he will now not be in town on Saturday to receive DME and would like sw to request to have DME delivered today or tomorrow. Son shares he needs to make some calls to confirm which day and would follow up with SW.

## 2022-09-06 NOTE — Progress Notes (Addendum)
Patient ID: Kara Bailey, female   DOB: 05/31/1956, 66 y.o.   MRN: [id number]  [facility] referral sent to [facility] sent to [facility]

## 2022-09-06 NOTE — Progress Notes (Signed)
Occupational Therapy Discharge Summary  Patient Details  Name: Kara Bailey MRN: [id number] Date of Birth: [birth year]  Date of Discharge from [place] 26, 2023   Patient has met 10 of 10 long term goals due to improved balance, postural control, and ability to compensate for deficits.  Patient to discharge at [facility] Assist level with transfers, bathing, CGA with donning bra, set up with shirt, mod I with grooming, min -mod toileting and LB dressing.   Patient's care partner  did not attend any of the planned OT sessions for education (over 5 opportunities).  A video was made on pt's phone for pt to show family on how to do safe transfers w/c >< toilet, how to facilitate standing and assist with clothing management.    Pt should not try to shower at home until [facility] evaluates safety of tub bench transfers. Unable to fully simulate her home environment and family did not attend sessions to help clarify set up.   Reasons goals not met: n/a  Recommendation:  Patient will benefit from ongoing skilled OT services in home health setting to continue to advance functional skills in the area of BADL and iADL.  Equipment: Drop arm BSC  Reasons for discharge: treatment goals met  Patient/family agrees with progress made and goals achieved: Yes  OT Discharge Precautions/Restrictions   fall   ADL ADL Eating: Modified independent Grooming: Modified independent Where Assessed-Grooming: Sitting at sink Upper Body Bathing: Supervision/safety Where Assessed-Upper Body Bathing: Shower Lower Body Bathing: CGA/Minimal assistance (A to wash bottom) Where Assessed-Lower Body Bathing: Shower Upper Body Dressing: CGA/Minimal assistance (min for bra, set up for shirt) Where Assessed-Upper Body Dressing: Wheelchair Lower Body Dressing: Moderate assistance Where Assessed-Lower Body Dressing: Wheelchair Toileting: Minimal assistance Where Assessed-Toileting: Glass blower/designer:  Psychiatric nurse Method: Engineer, water: Drop arm bedside commode Tub/Shower Transfer: Minimal assistance Tub/Shower Transfer Method: Sit pivot Tub/Shower Equipment: Radio broadcast assistant, Energy manager: Environmental education officer Method: Education officer, environmental: Radio broadcast assistant, Grab bars ADL Comments: Pt has made good progress and recalls all hemi techniques without cues. Vision Baseline Vision/History: 1 Wears glasses Patient Visual Report: No change from baseline Vision Assessment?: Yes Eye Alignment: Within Functional Limits Ocular Range of Motion: Within Functional Limits Alignment/Gaze Preference: Within Defined Limits Tracking/Visual Pursuits: Decreased smoothness of eye movement to LEFT inferior field;Decreased smoothness of eye movement to LEFT superior field;Decreased smoothness of vertical tracking;Requires cues, head turns, or add eye shifts to track (decreased smoothness to L) Saccades: Within functional limits Visual Fields: No apparent deficits (pt has functional peripheral vision) Perception  Perception: Within Functional Limits Inattention/Neglect: Appears intact Praxis Praxis: Intact Cognition Cognition Overall Cognitive Status: Within Functional Limits for tasks assessed Arousal/Alertness: Awake/alert Orientation Level: Person;Place;Situation Person: Oriented Place: Oriented Situation: Oriented Memory: Appears intact Focused Attention: Appears intact Sustained Attention: Appears intact Selective Attention: Appears intact Awareness: Appears intact Safety/Judgment: Appears intact Brief Interview for Mental Status (BIMS) Repetition of Three Words (First Attempt): 3 Temporal Orientation: Year: Correct Temporal Orientation: Month: Accurate within 5 days Temporal Orientation: Day: Correct Recall: "Sock": Yes, no cue required Recall: "Blue": Yes, no cue required Recall:  "Bed": Yes, no cue required BIMS Summary Score: 15 Sensation Sensation Light Touch: Impaired by gross assessment Peripheral sensation comments: Reports diminished sensation in LLE with tingling at times, pt reports intact in LUE Light Touch Impaired Details: Impaired LLE Hot/Cold: Appears Intact Proprioception: Impaired Detail Stereognosis: Impaired Detail Coordination Gross  Motor Movements are Fluid and Coordinated: No Fine Motor Movements are Fluid and Coordinated: No Coordination and Movement Description: dense L hemiplegia with hypertone in LUE Finger Nose Finger Test: unable to perform on the LUE Heel Shin Test: unable to perform on the LLE Motor  Motor Motor: Hemiplegia Motor - Skilled Clinical Observations: dense LUE and LLE hemiplegia Motor - Discharge Observations: dense LUE and LLE hemiplegia Mobility  Transfers Sit to Stand: Contact Guard/Touching assist Stand to Sit: Contact Guard/Touching assist  Trunk/Postural Assessment  Postural Control Trunk Control: pt is now able to stand statically in midline with CGA and RUE support  Balance Static Sitting Balance Static Sitting - Level of Assistance: 7: Independent Dynamic Sitting Balance Dynamic Sitting - Level of Assistance: 5: Stand by assistance Static Standing Balance Static Standing - Level of Assistance: 4: Min assist (CGA) Dynamic Standing Balance Dynamic Standing - Level of Assistance: 2: Max assist Extremity/Trunk Assessment RUE Assessment RUE Assessment: Within Functional Limits LUE Assessment Passive Range of Motion (PROM) Comments: limited in sh flexion to 90 due to hypertone, hypertone keeps elbow flexed at 90 but pt can achieve full elbow ROM after using tone reduction strategies Active Range of Motion (AROM) Comments: 0 General Strength Comments: 0/5 LUE Body System: Neuro Brunstrum levels for arm and hand: Arm;Hand Brunstrum level for arm: Stage I Presynergy Brunstrum level for hand: Stage I  Flaccidity LUE Tone LUE Tone: Moderate;Hypertonic   Kara Bailey 09/06/2022, 9:48 AM

## 2022-09-06 NOTE — Plan of Care (Signed)
  Problem: RH Balance Goal: LTG: Patient will maintain dynamic sitting balance (OT) Description: LTG:  Patient will maintain dynamic sitting balance with assistance during activities of daily living (OT) Outcome: Completed/Met Goal: LTG Patient will maintain dynamic standing with ADLs (OT) Description: LTG:  Patient will maintain dynamic standing balance with assist during activities of daily living (OT)  Outcome: Completed/Met   Problem: Sit to Stand Goal: LTG:  Patient will perform sit to stand in prep for activites of daily living with assistance level (OT) Description: LTG:  Patient will perform sit to stand in prep for activites of daily living with assistance level (OT) Outcome: Completed/Met   Problem: RH Grooming Goal: LTG Patient will perform grooming w/assist,cues/equip (OT) Description: LTG: Patient will perform grooming with assist, with/without cues using equipment (OT) Outcome: Completed/Met   Problem: RH Bathing Goal: LTG Patient will bathe all body parts with assist levels (OT) Description: LTG: Patient will bathe all body parts with assist levels (OT) Outcome: Completed/Met   Problem: RH Dressing Goal: LTG Patient will perform upper body dressing (OT) Description: LTG Patient will perform upper body dressing with assist, with/without cues (OT). Outcome: Completed/Met Goal: LTG Patient will perform lower body dressing w/assist (OT) Description: LTG: Patient will perform lower body dressing with assist, with/without cues in positioning using equipment (OT) Outcome: Completed/Met   Problem: RH Toileting Goal: LTG Patient will perform toileting task (3/3 steps) with assistance level (OT) Description: LTG: Patient will perform toileting task (3/3 steps) with assistance level (OT)  Outcome: Completed/Met   Problem: RH Toilet Transfers Goal: LTG Patient will perform toilet transfers w/assist (OT) Description: LTG: Patient will perform toilet transfers with assist,  with/without cues using equipment (OT) Outcome: Completed/Met   Problem: RH Tub/Shower Transfers Goal: LTG Patient will perform tub/shower transfers w/assist (OT) Description: LTG: Patient will perform tub/shower transfers with assist, with/without cues using equipment (OT) Outcome: Completed/Met

## 2022-09-07 NOTE — Progress Notes (Signed)
PROGRESS NOTE   Subjective/Complaints:  Ambulance transport came by yesterday evening, patient family declined transport due to time of day.  Discussed with social work.  Discharge from physical therapy yesterday  ROS:  limited due to severe dysarthria  Objective:   No results found. No results for input(s): "WBC", "HGB", "HCT", "PLT" in the last 72 hours.    No results for input(s): "NA", "K", "CL", "CO2", "GLUCOSE", "BUN", "CREATININE", "CALCIUM" in the last 72 hours.     Intake/Output Summary (Last 24 hours) at 09/07/2022 0941 Last data filed at 09/06/2022 1749 Gross per 24 hour  Intake 355 ml  Output --  Net 355 ml         Physical Exam: Vital Signs Blood pressure 138/65, pulse 93, temperature 97.9 F (36.6 C), temperature source Oral, resp. rate 14, height 5' 5.98" (1.676 m), weight 57 kg, SpO2 100 %.   General: No acute distress Mood and affect are appropriate Heart: Regular rate and rhythm no rubs murmurs or extra sounds Lungs: Clear to auscultation, breathing unlabored, no rales or wheezes Abdomen: Positive bowel sounds, soft nontender to palpation, nondistended Extremities: No clubbing, cyanosis, or edema Skin: No evidence of breakdown, no evidence of rash  motor strength is 5/5 in right , 0/5 left deltoid, bicep, tricep, grip, hip flexor, knee extensors, ankle dorsiflexor and plantar flexor Trace Left hip/knee ext synergy  Tone MAS 3 in left pec and biceps Sensory exam intact LT left foot and on RIght side Musculoskeletal: pain with Left shoulder ER and abd    No joint swelling or tenderness noted   Assessment/Plan: 1. 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 Stable for D/C today F/u PCP in 3-4 weeks F/u PM&R 2 weeks See D/C summary See D/C instructions  Care Tool:  Bathing    Body parts bathed by patient: Abdomen, Chest, Front perineal area, Right upper leg, Left upper leg,  Left arm, Face, Right arm, Right lower leg, Left lower leg   Body parts bathed by helper: Buttocks     Bathing assist Assist Level: Contact Guard/Touching assist     Upper Body Dressing/Undressing Upper body dressing   What is the patient wearing?: Bra, Pull over shirt    Upper body assist Assist Level: Minimal Assistance - Patient > 75% (set up shirt, min bra)    Lower Body Dressing/Undressing Lower body dressing      What is the patient wearing?: Incontinence brief, Pants     Lower body assist Assist for lower body dressing: Moderate Assistance - Patient 50 - 74%     Toileting Toileting    Toileting assist Assist for toileting: Minimal Assistance - Patient > 75%     Transfers Chair/bed transfer  Transfers assist  Chair/bed transfer activity did not occur: Safety/medical concerns  Chair/bed transfer assist level: Minimal Assistance - Patient > 75%     Locomotion Ambulation   Ambulation assist   Ambulation activity did not occur: Safety/medical concerns  Assist level: Maximal Assistance - Patient 25 - 49% Assistive device: Walker-hemi Max distance: 60   Walk 10 feet activity   Assist  Walk 10 feet activity did not occur: Safety/medical concerns  Assist level:  Maximal Assistance - Patient 25 - 49% Assistive device: Walker-hemi   Walk 50 feet activity   Assist Walk 50 feet with 2 turns activity did not occur: Safety/medical concerns  Assist level: Maximal Assistance - Patient 25 - 49% Assistive device: Walker-hemi    Walk 150 feet activity   Assist Walk 150 feet activity did not occur: Safety/medical concerns         Walk 10 feet on uneven surface  activity   Assist Walk 10 feet on uneven surfaces activity did not occur: Safety/medical concerns         Wheelchair     Assist Is the patient using a wheelchair?: Yes Type of Wheelchair: Manual    Wheelchair assist level: Supervision/Verbal cueing Max wheelchair distance: 200     Wheelchair 50 feet with 2 turns activity    Assist        Assist Level: Supervision/Verbal cueing   Wheelchair 150 feet activity     Assist      Assist Level: Supervision/Verbal cueing   Blood pressure 138/65, pulse 93, temperature 97.9 F (36.6 C), temperature source Oral, resp. rate 14, height 5' 5.98" (1.676 m), weight 57 kg, SpO2 100 %.  Medical Problem List and Plan: 1. 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  Antithrombotics: -DVT/anticoagulation:  Pharmaceutical: cont Lovenox , doppler without evidence of DVT              -antiplatelet therapy: continue Plavix and aspirin as PTA 3. Pain Management: tylenol as needed  10/22- will add tramadol 50 mg q6 hours prn for severe pain- nursing feels it's legitimate 4. Depression/insomnia: LCSW to evaluate and provide emotional support             -antipsychotic agents: n/a             -bupropion ER 150 mg q AM             -continue mirtazapine 30 mg q HS             -continue alprazolam 0.5 mg daily prn anxiety  -10/7 start melatonin PRN  10/22- pt remembers to ask for Xanax, however cannot call when incontinent 5. Neuropsych/cognition: This patient is capable of making decisions on her own behalf. 6. Skin/Wound Care: routine skin care checks 7. Dysphagia: routine Is and Os and follow-up chemistries             continue Dysphagia 2 diet/thin-SLP Need to enc fluids    9: Hypertension: continue to monitor;  avoid hypotension   Vitals:   09/07/22 0518 09/07/22 0859  BP: (!) 140/69 138/65  Pulse: 83 93  Resp: 14   Temp: 97.9 F (36.6 C)   SpO2: 100% 100%   Cardizem CD '240mg'$  per day 10/26 controlled swallowing pills whole  10: Hyperlipidemia: LDL = 125, counsel regarding healthy diet              -continue  ezetimibe 10 mg daily             -continue atorvastatin 90 mg daily 11: Tobacco use: cessation counseling             -wean nicotine patch 21 mg -->23mg 12. Constipation        Senna S BID , continue BID miralax  10/23-  BM x 2 yesterday  13. GERD             -was not taking PPI at home, was taking famotidine qhs  14.  Dysarthria improving continue SLP  -On dys 2 diet with thin liquids  15. Insomnia- remeron '30mg'$  qhs      LOS: 37 days A FACE TO FACE EVALUATION WAS PERFORMED  Charlett Blake 09/07/2022, 9:41 AM

## 2022-09-07 NOTE — Progress Notes (Signed)
Patient ID: Kara Bailey, female   DOB: [birth year], 66 y.o.   MRN: [id number]  SW informed patient did not discharge last night due to family concern. SW informed by Kara Bailey that patient S.O, Kara Bailey and son, Kara Bailey informed transportation that they were not aware of the patient discharging home. Son reported to [facility] that patient should be discharging to a skilled facility.   SW met with patient in room and called son via telephone to discuss the plan for discharge. Patient son reports that last night was too late for his mother to discharge home. Son reports he is unaware if his mother is able to complete tranfers in a car. Sw informed patient son, therapist has made several attempts to meet for family education to practice car tranfers and inform them of patient needs at home. Due to family not attending education ambulance transportation was recommended by therapist. Videos have been recorded on patients phone for education, and discharge recommendations listed on d/c instructions.  Sw informed patient son again that insurance does not cover home care services or long term care SNF, only home health services of continued PT OT SLP. Son requested to schedule family education, sw informed patient son that as of 10/26 patient has been discharged from therapy services. Patient son understood.  Physician present, sw discussed with patient and son, Kara Bailey that patient transportation will be arranged for discharge home today. Patient and son agreed. Patient ready for discharge. Son will have someone pick up patient belongings. No additional questions or concerns.

## 2022-09-07 NOTE — Plan of Care (Signed)
  Problem: RH Balance Goal: LTG Patient will maintain dynamic sitting balance (PT) Description: LTG:  Patient will maintain dynamic sitting balance with assistance during mobility activities (PT) Outcome: Completed/Met Goal: LTG Patient will maintain dynamic standing balance (PT) Description: LTG:  Patient will maintain dynamic standing balance with assistance during mobility activities (PT) Outcome: Completed/Met   Problem: Sit to Stand Goal: LTG:  Patient will perform sit to stand with assistance level (PT) Description: LTG:  Patient will perform sit to stand with assistance level (PT) Outcome: Completed/Met   Problem: RH Bed Mobility Goal: LTG Patient will perform bed mobility with assist (PT) Description: LTG: Patient will perform bed mobility with assistance, with/without cues (PT). Outcome: Completed/Met   Problem: RH Bed to Chair Transfers Goal: LTG Patient will perform bed/chair transfers w/assist (PT) Description: LTG: Patient will perform bed to chair transfers with assistance (PT). Outcome: Completed/Met   Problem: RH Car Transfers Goal: LTG Patient will perform car transfers with assist (PT) Description: LTG: Patient will perform car transfers with assistance (PT). Outcome: Completed/Met   Problem: RH Ambulation Goal: LTG Patient will ambulate in controlled environment (PT) Description: LTG: Patient will ambulate in a controlled environment, # of feet with assistance (PT). Outcome: Completed/Met   Problem: RH Wheelchair Mobility Goal: LTG Patient will propel w/c in controlled environment (PT) Description: LTG: Patient will propel wheelchair in controlled environment, # of feet with assist (PT) Outcome: Completed/Met Goal: LTG Patient will propel w/c in home environment (PT) Description: LTG: Patient will propel wheelchair in home environment, # of feet with assistance (PT). Outcome: Completed/Met

## 2022-09-07 NOTE — Progress Notes (Signed)
Speech Language Pathology Note  Patient Details  Name: Kara Bailey MRN: [id number] Date of Birth: 30-May-1956 Today's Date: 09/07/2022  Cancellation: Speech therapy session did not occur, as pt was not in room during time of arrival and SLP was told by nurse that pt was in the process of discharging. Bags were packed and belongings were at the door. Plan to proceed with discharge from [facility] services.   Patty Sermons 09/07/2022, 9:51 AM

## 2022-09-19 ENCOUNTER — Encounter: Payer: Medicare HMO | Attending: Registered Nurse | Admitting: Registered Nurse

## 2022-09-19 ENCOUNTER — Telehealth: Payer: Self-pay

## 2022-09-19 VITALS — BP 127/81 | HR 79 | Ht 69.0 in

## 2022-09-19 DIAGNOSIS — I10 Essential (primary) hypertension: Secondary | ICD-10-CM

## 2022-09-19 DIAGNOSIS — E7849 Other hyperlipidemia: Secondary | ICD-10-CM

## 2022-09-19 DIAGNOSIS — G8194 Hemiplegia, unspecified affecting left nondominant side: Secondary | ICD-10-CM | POA: Diagnosis not present

## 2022-09-19 DIAGNOSIS — I639 Cerebral infarction, unspecified: Secondary | ICD-10-CM

## 2022-09-19 NOTE — Telephone Encounter (Signed)
Pt came in today for TC appt alone. Pt was not communicative. I called the son and spoke with the son and daughter to see what care patient was getting at home now. Asked if pt saw her PCP yet, daughter states she had an appt but transportation broke down. I advised daughter to reschedule the appt to review medications but patients does not need to run out of medications especially Plavix. I spent about 30 mins on the phone explaining to them the importance of these visits and that someone needs to come to the appts with the patient. Patient has an appt to see Dr. Letta Pate and while patient was here Zella Ball called Neuro to schedule appt for her.

## 2022-09-19 NOTE — Progress Notes (Signed)
Subjective:    Patient ID: [name], female    DOB: 04/17/1956, 66 y.o.   MRN: [id number]  HPI: VERMELL MADRID is a 66 y.o. female who is here for HFU appointment for F/U of her  Ischemic Stroke, Left Hemiplegia, Essential Hypertension and Hyperlipidemia. Ms. Veale arrived to office alone in her wheelchair, very tearful. She states she lives with her significant other whose in a wheelchair. Kennon Rounds CMA placed a call to her son [name] and Daughter, see her note for details.   Ms. Zern presented to [facility] on 07/26/2022 via EMS, with left sided weakness and slurred speech.   CT Head WO Contrast:  IMPRESSION: 1. Advanced chronic small-vessel disease appears stable by CT since July. No acute cortically based infarct or acute intracranial hemorrhage identified. ASPECTS 10.  CTA:  IMPRESSION: 1. Negative for large vessel occlusion.   2. But positive for intracranial atherosclerosis, with some progression since the CTA last year: - increased moderate irregularity and stenosis of the distal Right M1 and dominant posterior M2 branch. - similar increased and moderate right PCA P2 stenosis. - stable mild to moderate bilateral ACA A2 and Left PCA irregularity and stenosis.   3. No extracranial hemodynamically significant stenosis despite atherosclerotic plaque.   4. Aortic Atherosclerosis (ICD10-I70.0).   MR Brain WO Contrast IMPRESSION: 1. Patchy acute on chronic small vessel ischemia in the right corona radiata, tracking toward the posterior right lentiform. No associated hemorrhage or mass effect.   2. Otherwise stable advanced chronic small vessel disease and chronic hemorrhagic right superior cerebellar infarct.  Neurology was consulted.   Ms. Yow was admitted to inpatient rehabilitation on 08/01/2022 and discharged home on 09/06/2022. She is receiving home health therapy from [facility]. She states she has left shoulder pain. She rates her pain 6.     [facility] was called today, to schedule HFU appointment, daughter and son is aware of the appointment. They were also iunstructed to call her PCP to schedule HFU appointment, they verbalize understanding to Kennon Rounds CMA.    Pain Inventory Average Pain 8 Pain Right Now 6 My pain is sharp  LOCATION OF PAIN  shoulder  BOWEL Number of stools per week: 6 Oral laxative use Yes  Type of laxative ducolax Enema or suppository use No  History of colostomy No  Incontinent No   BLADDER Pads In and out cath, frequency . Able to self cath  . Bladder incontinence No  Frequent urination No  Leakage with coughing No  Difficulty starting stream No  Incomplete bladder emptying No    Mobility how many minutes can you walk? 0 ability to climb steps?  no do you drive?  no use a wheelchair needs help with transfers  Function retired I need assistance with the following:  bathing and household duties  Neuro/Psych  bladder control problems weakness trouble walking depression anxiety Prior Studies TC appt  Physicians involved in your care TC appt   Family History  Problem Relation Age of Onset   Heart failure Mother    Heart attack Mother    Heart disease Mother    Hypertension Mother    Hyperlipidemia Mother    CVA Father    Alzheimer's disease Father    Cancer Neg Hx    Colon cancer Neg Hx    Esophageal cancer Neg Hx    Rectal cancer Neg Hx    Stomach cancer Neg Hx    Social History   Socioeconomic  History   Marital status: Divorced    Spouse name: Not on file   Number of children: 2   Years of education: Not on file   Highest education level: Not on file  Occupational History   Occupation: retired  Tobacco Use   Smoking status: Every Day    Packs/day: 0.50    Years: 40.00    Total pack years: 20.00    Types: Cigarettes    Start date: 11/12/1968   Smokeless tobacco: Never   Tobacco comments:    cutting back, wearing patches  Vaping Use    Vaping Use: Never used  Substance and Sexual Activity   Alcohol use: Yes    Alcohol/week: 0.0 standard drinks of alcohol    Comment: beer on weekends (6-pack)   Drug use: No   Sexual activity: Not Currently  Other Topics Concern   Not on file  Social History Narrative   Not on file   Social Determinants of Health   Financial Resource Strain: Not on file  Food Insecurity: Not on file  Transportation Needs: Not on file  Physical Activity: Not on file  Stress: Not on file  Social Connections: Not on file   Past Surgical History:  Procedure Laterality Date   ABDOMINAL AORTOGRAM W/LOWER EXTREMITY N/A 07/13/2021   Procedure: ABDOMINAL AORTOGRAM W/LOWER EXTREMITY;  Surgeon: Lorretta Harp, MD;  Location: [facility];  Service: Cardiovascular;  Laterality: N/A;   ABDOMINAL AORTOGRAM W/LOWER EXTREMITY N/A 03/19/2022   Procedure: ABDOMINAL AORTOGRAM W/LOWER EXTREMITY;  Surgeon: Lorretta Harp, MD;  Location: [place] [facility];  Service: Cardiovascular;  Laterality: N/A;   APPENDECTOMY     CATARACT EXTRACTION, BILATERAL     COLONOSCOPY     COLONOSCOPY WITH PROPOFOL N/A 04/30/2016   Procedure: COLONOSCOPY WITH PROPOFOL;  Surgeon: Daneil Dolin, MD;  [facility]: [facility];  Service: Endoscopy;  Laterality: N/A;  [place] N/A 10/13/2021   Procedure: OPEN REPAIR EPIGASTRIC HERNIA WITH MESH PATCH;  Surgeon: Armandina Gemma, MD;  Location: [facility];  Service: General;  Laterality: N/A;   ESOPHAGOGASTRODUODENOSCOPY     approximately 2010   ESOPHAGOGASTRODUODENOSCOPY (EGD) WITH PROPOFOL N/A 04/30/2016   Procedure: ESOPHAGOGASTRODUODENOSCOPY (EGD) WITH PROPOFOL;  Surgeon: Daneil Dolin, MD;  [facility]: [facility];  Service: Endoscopy;  Laterality: N/A;   LEFT HEART CATHETERIZATION WITH CORONARY ANGIOGRAM N/A 10/29/2014   07-14-20- pt denies this Procedure: LEFT HEART CATHETERIZATION WITH CORONARY ANGIOGRAM;  Surgeon: Burnell Blanks, MD;  Location: [facility]  LAB;  Service: Cardiovascular;  Laterality: N/A;   MALONEY DILATION N/A 04/30/2016   Procedure: Venia Minks DILATION;  Surgeon: Daneil Dolin, MD;  [facility];  Service: Endoscopy;  Laterality: N/A;   PERIPHERAL VASCULAR INTERVENTION  07/13/2021   Procedure: PERIPHERAL VASCULAR INTERVENTION;  Surgeon: Lorretta Harp, MD;  Location: [place] [facility];  Service: Cardiovascular;;  left SFA left external iliac   PERIPHERAL VASCULAR INTERVENTION Right 03/19/2022   Procedure: PERIPHERAL VASCULAR INTERVENTION;  Surgeon: Lorretta Harp, MD;  Location: [place] [facility];  Service: Cardiovascular;  Laterality: Right;  SFA   POLYPECTOMY  04/30/2016   Procedure: POLYPECTOMY;  Surgeon: Daneil Dolin, MD;  [facility];  Service: Endoscopy;;  Sigmoid colon polyp removed via hot snare   UPPER GASTROINTESTINAL ENDOSCOPY     Past Medical History:  Diagnosis Date   Anxiety    Chest pain 08/2014   unspecified   Cirrhosis ([place])  Depression    Dizziness 08/2014   Dyspnea 08/2014   Dysrhythmia    Emphysema lung (HCC)    GERD (gastroesophageal reflux disease)    Heart murmur    Hepatitis C    Hiatal hernia    Hypertension    PAC (premature atrial contraction) 10/11/2014   PAD (peripheral artery disease) (HCC)    Palpitations    PVC (premature ventricular contraction) 10/11/2014   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   Stroke ([place])    just slurred speech   Tachycardia 08/2014   Ventral hernia    Weakness 08/2014   BP 127/81   Pulse 79   Ht '5\' 9"'$  (1.753 m)   SpO2 97%   BMI 18.56 kg/m   Opioid Risk Score:   Fall Risk Score:  `1  Depression screen [facility] 2/9     09/19/2022   10:07 AM 08/13/2018   10:28 AM 08/14/2017    3:49 PM 03/12/2017    8:45 AM 02/12/2017    8:45 AM 12/31/2016   11:05 AM 10/11/2016   12:22 PM  Depression screen PHQ 2/9  Decreased Interest '3 3 2 '$ 0 '1 1 1  '$ Down, Depressed, Hopeless '3 1 2 1 1 '$ 0 1  PHQ - 2 Score '6 4 4 1 2 1 2  '$ Altered  sleeping '2 3 3 1 1 '$ 0 1  Tired, decreased energy '2 3 2 1 '$ 0 1 1  Change in appetite 2 3 0 1 0 0 1  Feeling bad or failure about yourself  3 0 1 0 0 0 1  Trouble concentrating 0 1 0 0 0 0 1  Moving slowly or fidgety/restless 3 0 0 0 0 0 1  Suicidal thoughts 2 0 0 0 0 0 0  PHQ-9 Score '20 14 10 4 3 2 8  '$ Difficult doing work/chores Somewhat difficult           Review of Systems     Objective:   Physical Exam Vitals and nursing note reviewed.  Constitutional:      Appearance: Normal appearance.  Cardiovascular:     Rate and Rhythm: Normal rate and regular rhythm.     Pulses: Normal pulses.     Heart sounds: Normal heart sounds.  Pulmonary:     Effort: Pulmonary effort is normal.     Breath sounds: Normal breath sounds.  Musculoskeletal:     Cervical back: Normal range of motion and neck supple.     Comments: Normal Muscle Bulk and Muscle Testing Reveals:  Upper Extremities: Right: Full ROM and Muscle Strength 5/5 Left Upper Extremity: Paralysis : Muscle Strength 0/5 Lower Extremities: Right : Full ROM and Muscle Strength 5/5 Left Lower Extremity: Paralysis  Arrived in wheelchair      Skin:    General: Skin is warm and dry.  Neurological:     Mental Status: She is alert and oriented to person, place, and time.  Psychiatric:        Mood and Affect: Mood normal.        Behavior: Behavior normal.         Assessment & Plan:  Ischemic Stroke Lef Hemiplegia: Charlotta Newton current medication regimen. Continue Home Health Therapy with [facility], Call was placed to [facility], they states she is receiving Physical, Occupational and Speech Therapy, daughter didn't know the name of the agency that is coming to Ms. Morris home. Call placed to [facility] to schedule HFU appointment, daughter and son is aware  of the appointment. , 2.  Essential Hypertension: Continue current medication regimen. PCP following. Continue to Monitor.  3. Hyperlipidemia.Continue current  Medication Regimen PCP following. Continue to Monitor.   F/U with Dr Letta Pate in 4- 6 weeks   Spent 60 Minutes coordinating care for Ms. Kenton Kingfisher

## 2022-09-24 ENCOUNTER — Encounter: Payer: Self-pay | Admitting: Registered Nurse

## 2022-10-10 ENCOUNTER — Other Ambulatory Visit: Payer: Self-pay | Admitting: Physician Assistant

## 2022-10-13 ENCOUNTER — Other Ambulatory Visit: Payer: Self-pay | Admitting: Physician Assistant

## 2022-11-06 ENCOUNTER — Encounter: Payer: Self-pay | Admitting: Neurology

## 2022-11-06 ENCOUNTER — Inpatient Hospital Stay: Payer: Medicare HMO | Admitting: Neurology

## 2022-11-08 ENCOUNTER — Encounter: Payer: Medicare HMO | Attending: Physical Medicine & Rehabilitation | Admitting: Physical Medicine & Rehabilitation

## 2022-11-08 DIAGNOSIS — I639 Cerebral infarction, unspecified: Secondary | ICD-10-CM | POA: Insufficient documentation

## 2022-11-08 DIAGNOSIS — E7849 Other hyperlipidemia: Secondary | ICD-10-CM | POA: Insufficient documentation

## 2022-11-08 DIAGNOSIS — I10 Essential (primary) hypertension: Secondary | ICD-10-CM | POA: Insufficient documentation

## 2022-11-08 DIAGNOSIS — G8194 Hemiplegia, unspecified affecting left nondominant side: Secondary | ICD-10-CM | POA: Insufficient documentation

## 2022-11-29 ENCOUNTER — Other Ambulatory Visit: Payer: Self-pay

## 2022-11-29 ENCOUNTER — Inpatient Hospital Stay (HOSPITAL_COMMUNITY)
Admission: EM | Admit: 2022-11-29 | Discharge: 2023-01-14 | DRG: 056 | Disposition: A | Discharge status: Skilled Nursing Facility | Payer: Medicare HMO | Attending: Internal Medicine | Admitting: Internal Medicine

## 2022-11-29 ENCOUNTER — Emergency Department (HOSPITAL_COMMUNITY): Payer: Medicare HMO

## 2022-11-29 ENCOUNTER — Encounter (HOSPITAL_COMMUNITY): Payer: Self-pay | Admitting: Internal Medicine

## 2022-11-29 DIAGNOSIS — I69321 Dysphasia following cerebral infarction: Principal | ICD-10-CM

## 2022-11-29 DIAGNOSIS — R8271 Bacteriuria: Secondary | ICD-10-CM | POA: Diagnosis present

## 2022-11-29 DIAGNOSIS — R053 Chronic cough: Secondary | ICD-10-CM | POA: Diagnosis present

## 2022-11-29 DIAGNOSIS — G8929 Other chronic pain: Secondary | ICD-10-CM | POA: Diagnosis present

## 2022-11-29 DIAGNOSIS — R252 Cramp and spasm: Secondary | ICD-10-CM | POA: Diagnosis present

## 2022-11-29 DIAGNOSIS — B0223 Postherpetic polyneuropathy: Secondary | ICD-10-CM | POA: Diagnosis present

## 2022-11-29 DIAGNOSIS — L89152 Pressure ulcer of sacral region, stage 2: Secondary | ICD-10-CM | POA: Diagnosis not present

## 2022-11-29 DIAGNOSIS — R5381 Other malaise: Secondary | ICD-10-CM | POA: Diagnosis present

## 2022-11-29 DIAGNOSIS — I10 Essential (primary) hypertension: Secondary | ICD-10-CM | POA: Diagnosis not present

## 2022-11-29 DIAGNOSIS — R195 Other fecal abnormalities: Secondary | ICD-10-CM

## 2022-11-29 DIAGNOSIS — Z823 Family history of stroke: Secondary | ICD-10-CM

## 2022-11-29 DIAGNOSIS — Z751 Person awaiting admission to adequate facility elsewhere: Secondary | ICD-10-CM

## 2022-11-29 DIAGNOSIS — Z7902 Long term (current) use of antithrombotics/antiplatelets: Secondary | ICD-10-CM

## 2022-11-29 DIAGNOSIS — J439 Emphysema, unspecified: Secondary | ICD-10-CM | POA: Diagnosis present

## 2022-11-29 DIAGNOSIS — G4700 Insomnia, unspecified: Secondary | ICD-10-CM | POA: Diagnosis present

## 2022-11-29 DIAGNOSIS — E785 Hyperlipidemia, unspecified: Secondary | ICD-10-CM | POA: Diagnosis present

## 2022-11-29 DIAGNOSIS — D649 Anemia, unspecified: Secondary | ICD-10-CM | POA: Diagnosis present

## 2022-11-29 DIAGNOSIS — K810 Acute cholecystitis: Secondary | ICD-10-CM | POA: Diagnosis not present

## 2022-11-29 DIAGNOSIS — I69354 Hemiplegia and hemiparesis following cerebral infarction affecting left non-dominant side: Secondary | ICD-10-CM

## 2022-11-29 DIAGNOSIS — F32A Depression, unspecified: Secondary | ICD-10-CM | POA: Diagnosis present

## 2022-11-29 DIAGNOSIS — K921 Melena: Secondary | ICD-10-CM | POA: Diagnosis not present

## 2022-11-29 DIAGNOSIS — R64 Cachexia: Secondary | ICD-10-CM | POA: Diagnosis present

## 2022-11-29 DIAGNOSIS — I69391 Dysphagia following cerebral infarction: Secondary | ICD-10-CM | POA: Diagnosis not present

## 2022-11-29 DIAGNOSIS — K222 Esophageal obstruction: Secondary | ICD-10-CM | POA: Diagnosis present

## 2022-11-29 DIAGNOSIS — F17210 Nicotine dependence, cigarettes, uncomplicated: Secondary | ICD-10-CM | POA: Diagnosis present

## 2022-11-29 DIAGNOSIS — Z7982 Long term (current) use of aspirin: Secondary | ICD-10-CM

## 2022-11-29 DIAGNOSIS — I69322 Dysarthria following cerebral infarction: Secondary | ICD-10-CM

## 2022-11-29 DIAGNOSIS — R1312 Dysphagia, oropharyngeal phase: Secondary | ICD-10-CM | POA: Diagnosis present

## 2022-11-29 DIAGNOSIS — G8194 Hemiplegia, unspecified affecting left nondominant side: Secondary | ICD-10-CM

## 2022-11-29 DIAGNOSIS — K7460 Unspecified cirrhosis of liver: Secondary | ICD-10-CM | POA: Diagnosis present

## 2022-11-29 DIAGNOSIS — K521 Toxic gastroenteritis and colitis: Secondary | ICD-10-CM | POA: Diagnosis not present

## 2022-11-29 DIAGNOSIS — T3695XA Adverse effect of unspecified systemic antibiotic, initial encounter: Secondary | ICD-10-CM | POA: Diagnosis not present

## 2022-11-29 DIAGNOSIS — E739 Lactose intolerance, unspecified: Secondary | ICD-10-CM | POA: Diagnosis present

## 2022-11-29 DIAGNOSIS — Z8673 Personal history of transient ischemic attack (TIA), and cerebral infarction without residual deficits: Secondary | ICD-10-CM | POA: Diagnosis not present

## 2022-11-29 DIAGNOSIS — R627 Adult failure to thrive: Secondary | ICD-10-CM | POA: Diagnosis present

## 2022-11-29 DIAGNOSIS — R1310 Dysphagia, unspecified: Secondary | ICD-10-CM

## 2022-11-29 DIAGNOSIS — F1090 Alcohol use, unspecified, uncomplicated: Secondary | ICD-10-CM | POA: Diagnosis present

## 2022-11-29 DIAGNOSIS — Z681 Body mass index (BMI) 19 or less, adult: Secondary | ICD-10-CM

## 2022-11-29 DIAGNOSIS — K219 Gastro-esophageal reflux disease without esophagitis: Secondary | ICD-10-CM | POA: Diagnosis present

## 2022-11-29 DIAGNOSIS — R1314 Dysphagia, pharyngoesophageal phase: Secondary | ICD-10-CM | POA: Diagnosis present

## 2022-11-29 DIAGNOSIS — B9620 Unspecified Escherichia coli [E. coli] as the cause of diseases classified elsewhere: Secondary | ICD-10-CM | POA: Diagnosis present

## 2022-11-29 DIAGNOSIS — E43 Unspecified severe protein-calorie malnutrition: Secondary | ICD-10-CM | POA: Diagnosis present

## 2022-11-29 DIAGNOSIS — F411 Generalized anxiety disorder: Secondary | ICD-10-CM | POA: Diagnosis present

## 2022-11-29 DIAGNOSIS — Z91148 Patient's other noncompliance with medication regimen for other reason: Secondary | ICD-10-CM

## 2022-11-29 DIAGNOSIS — Z79899 Other long term (current) drug therapy: Secondary | ICD-10-CM

## 2022-11-29 DIAGNOSIS — K648 Other hemorrhoids: Secondary | ICD-10-CM | POA: Diagnosis present

## 2022-11-29 DIAGNOSIS — E876 Hypokalemia: Secondary | ICD-10-CM | POA: Diagnosis not present

## 2022-11-29 DIAGNOSIS — I739 Peripheral vascular disease, unspecified: Secondary | ICD-10-CM | POA: Diagnosis present

## 2022-11-29 DIAGNOSIS — R710 Precipitous drop in hematocrit: Secondary | ICD-10-CM

## 2022-11-29 DIAGNOSIS — Z8249 Family history of ischemic heart disease and other diseases of the circulatory system: Secondary | ICD-10-CM

## 2022-11-29 LAB — CBC WITH DIFFERENTIAL/PLATELET
Abs Immature Granulocytes: 0.02 10*3/uL (ref 0.00–0.07)
Basophils Absolute: 0 10*3/uL (ref 0.0–0.1)
Basophils Relative: 0 %
Eosinophils Absolute: 0.6 10*3/uL — ABNORMAL HIGH (ref 0.0–0.5)
Eosinophils Relative: 7 %
HCT: 36.8 % (ref 36.0–46.0)
Hemoglobin: 11.7 g/dL — ABNORMAL LOW (ref 12.0–15.0)
Immature Granulocytes: 0 %
Lymphocytes Relative: 32 %
Lymphs Abs: 2.5 10*3/uL (ref 0.7–4.0)
MCH: 28.5 pg (ref 26.0–34.0)
MCHC: 31.8 g/dL (ref 30.0–36.0)
MCV: 89.8 fL (ref 80.0–100.0)
Monocytes Absolute: 0.7 10*3/uL (ref 0.1–1.0)
Monocytes Relative: 9 %
Neutro Abs: 4.2 10*3/uL (ref 1.7–7.7)
Neutrophils Relative %: 52 %
Platelets: 215 10*3/uL (ref 150–400)
RBC: 4.1 MIL/uL (ref 3.87–5.11)
RDW: 16.7 % — ABNORMAL HIGH (ref 11.5–15.5)
WBC: 8 10*3/uL (ref 4.0–10.5)
nRBC: 0 % (ref 0.0–0.2)

## 2022-11-29 LAB — TROPONIN I (HIGH SENSITIVITY)
Troponin I (High Sensitivity): 16 ng/L (ref <18)
Troponin I (High Sensitivity): 18 ng/L — ABNORMAL HIGH (ref <18)

## 2022-11-29 LAB — COMPREHENSIVE METABOLIC PANEL
ALT: 11 U/L (ref 0–44)
AST: 22 U/L (ref 15–41)
Albumin: 3.5 g/dL (ref 3.5–5.0)
Alkaline Phosphatase: 52 U/L (ref 38–126)
Anion gap: 16 — ABNORMAL HIGH (ref 5–15)
BUN: 11 mg/dL (ref 8–23)
CO2: 22 mmol/L (ref 22–32)
Calcium: 9.4 mg/dL (ref 8.9–10.3)
Chloride: 104 mmol/L (ref 98–111)
Creatinine, Ser: 0.97 mg/dL (ref 0.44–1.00)
GFR, Estimated: >60 mL/min (ref >60)
Glucose, Bld: 79 mg/dL (ref 70–99)
Potassium: 4.3 mmol/L (ref 3.5–5.1)
Sodium: 142 mmol/L (ref 135–145)
Total Bilirubin: 1 mg/dL (ref 0.3–1.2)
Total Protein: 6.6 g/dL (ref 6.5–8.1)

## 2022-11-29 LAB — BRAIN NATRIURETIC PEPTIDE: B Natriuretic Peptide: 65.1 pg/mL (ref 0.0–100.0)

## 2022-11-29 LAB — LIPASE, BLOOD: Lipase: 33 U/L (ref 11–51)

## 2022-11-29 MED ORDER — MORPHINE SULFATE (PF) 2 MG/ML IV SOLN
2.0000 mg | Freq: Once | INTRAVENOUS | Status: AC
  Administered 2022-11-29: 2 mg via INTRAVENOUS
  Filled 2022-11-29: qty 1

## 2022-11-29 MED ORDER — LACTATED RINGERS IV SOLN: INTRAVENOUS | Status: AC

## 2022-11-29 MED ORDER — SODIUM CHLORIDE 0.9 % IV BOLUS: 500.0000 mL | Freq: Once | INTRAVENOUS | Status: DC

## 2022-11-29 MED ORDER — ONDANSETRON HCL 4 MG/2ML IJ SOLN
4.0000 mg | Freq: Once | INTRAMUSCULAR | Status: AC
  Administered 2022-11-29: 4 mg via INTRAVENOUS
  Filled 2022-11-29: qty 2

## 2022-11-29 MED ORDER — SODIUM CHLORIDE 0.9 % IV BOLUS
1000.0000 mL | Freq: Once | INTRAVENOUS | Status: AC
  Administered 2022-11-29: 1000 mL via INTRAVENOUS

## 2022-11-29 NOTE — ED Triage Notes (Signed)
Patient BIB EMS from Dr office. Per report pt unable to swallow fluid or food for the past 3 days. Primary care recommended patient to have PEG tube place. Pt Hx stroke last September.   BP 145/84 HR 104 RR 20 O2sat 99% on RA CBG 97

## 2022-11-29 NOTE — ED Notes (Signed)
Gave pt water and orange juice RN aware

## 2022-11-29 NOTE — Assessment & Plan Note (Signed)
Observation med/surg bed. Pt has not been able to tolerate liquids, pureed diet or any other form of nutrition. Unable to swallow any of her pills for at least 1 month. Pt and family desire PEG tube. Pt has not been on plavix for about 1 month. Will need nutrition consult for enteral formula recommendations. Dtr(melissa) available to learn how to give enteral formula via PEG. Will place IV consult. SCDs for DVT prophylaxis. IVF overnight.

## 2022-11-29 NOTE — Assessment & Plan Note (Signed)
Chronic. 

## 2022-11-29 NOTE — H&P (Signed)
History and Physical    Kara Bailey YHC:[id number] DOB: [birth year] DOA: 11/29/2022  DOS: the patient was seen and examined on 11/29/2022  PCP: Andree Moro, DO   Patient coming from: Home  I have personally briefly reviewed patient's old medical records in [place]  CC: dysphagia HPI: 67 year old African-American female history of multiple strokes, dense left hemiparesis, hypertension who presents to the ER today with worsening dysphagia.  Family has been trying to feed her with various levels of liquids and consistencies without success.  Patient has become tolerant to swallowing any pills.  She can only tolerate small sips of liquids.  Patient was seen by her PCP and sent to ER today for for PEG tube placement.  Family and patient are agreeable to PEG tube placement.  Patient not had Plavix in over a month now.  [facility] hospitalist contacted for admission.   ED Course: CXR  negative  Review of Systems:  Review of Systems  Constitutional: Negative.   HENT: Negative.    Eyes: Negative.   Respiratory: Negative.    Cardiovascular: Negative.   Gastrointestinal:        Worsening dysphagia. Unable to tolerate liquids, solids, pureed foods.  Genitourinary: Negative.   Musculoskeletal:        Left shoulder pain. Chronic.  Skin: Negative.   Endo/Heme/Allergies: Negative.   Psychiatric/Behavioral: Negative.    All other systems reviewed and are negative.   Past Medical History:  Diagnosis Date   Acute CVA (cerebrovascular accident) ([place]) 12/29/2020   Anxiety    Chest pain 08/2014   unspecified   Cirrhosis ([place])    CVA (cerebral vascular accident) ([place]) 07/27/2022   Depression    Dizziness 08/2014   Dyspnea 08/2014   Dysrhythmia    Emphysema lung (HCC)    GERD (gastroesophageal reflux disease)    Heart murmur    Hepatitis C    Hiatal hernia    Hypertension    Ischemic stroke ([place]) 08/01/2022   PAC (premature atrial contraction) 10/11/2014   PAD (peripheral  artery disease) (HCC)    Palpitations    PVC (premature ventricular contraction) 10/11/2014   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   Stroke ([place])    just slurred speech   Tachycardia 08/2014   Ventral hernia    Weakness 08/2014    Past Surgical History:  Procedure Laterality Date   ABDOMINAL AORTOGRAM W/LOWER EXTREMITY N/A 07/13/2021   Procedure: ABDOMINAL AORTOGRAM W/LOWER EXTREMITY;  Surgeon: Lorretta Harp, MD;  Location: [facility];  Service: Cardiovascular;  Laterality: N/A;   ABDOMINAL AORTOGRAM W/LOWER EXTREMITY N/A 03/19/2022   Procedure: ABDOMINAL AORTOGRAM W/LOWER EXTREMITY;  Surgeon: Lorretta Harp, MD;  Location: [facility];  Service: Cardiovascular;  Laterality: N/A;   APPENDECTOMY     CATARACT EXTRACTION, BILATERAL     COLONOSCOPY     COLONOSCOPY WITH PROPOFOL N/A 04/30/2016   Procedure: COLONOSCOPY WITH PROPOFOL;  Surgeon: Daneil Dolin, MD;  [facility];  Service: Endoscopy;  Laterality: N/A;  [place] N/A 10/13/2021   Procedure: OPEN REPAIR EPIGASTRIC HERNIA WITH MESH PATCH;  Surgeon: Armandina Gemma, MD;  Location: [facility];  Service: General;  Laterality: N/A;   ESOPHAGOGASTRODUODENOSCOPY     approximately 2010   ESOPHAGOGASTRODUODENOSCOPY (EGD) WITH PROPOFOL N/A 04/30/2016   Procedure: ESOPHAGOGASTRODUODENOSCOPY (EGD) WITH PROPOFOL;  Surgeon: Daneil Dolin, MD;  [facility];  Service: Endoscopy;  Laterality: N/A;   LEFT HEART CATHETERIZATION WITH  CORONARY ANGIOGRAM N/A 10/29/2014   07-14-20- pt denies this Procedure: LEFT HEART CATHETERIZATION WITH CORONARY ANGIOGRAM;  Surgeon: Burnell Blanks, MD;  Location: [facility];  Service: Cardiovascular;  Laterality: N/A;   MALONEY DILATION N/A 04/30/2016   Procedure: Venia Minks DILATION;  Surgeon: Daneil Dolin, MD;  [facility];  Service: Endoscopy;  Laterality: N/A;   PERIPHERAL VASCULAR INTERVENTION  07/13/2021   Procedure:  PERIPHERAL VASCULAR INTERVENTION;  Surgeon: Lorretta Harp, MD;  Location: [facility];  Service: Cardiovascular;;  left SFA left external iliac   PERIPHERAL VASCULAR INTERVENTION Right 03/19/2022   Procedure: PERIPHERAL VASCULAR INTERVENTION;  Surgeon: Lorretta Harp, MD;  Location: [facility];  Service: Cardiovascular;  Laterality: Right;  SFA   POLYPECTOMY  04/30/2016   Procedure: POLYPECTOMY;  Surgeon: Daneil Dolin, MD;  [facility];  Service: Endoscopy;;  Sigmoid colon polyp removed via hot snare   UPPER GASTROINTESTINAL ENDOSCOPY       reports that she has been smoking cigarettes. She started smoking about 54 years ago. She has a 20.00 pack-year smoking history. She has never used smokeless tobacco. She reports that she does not currently use alcohol. She reports that she does not use drugs.  No Known Allergies  Family History  Problem Relation Age of Onset   Heart failure Mother    Heart attack Mother    Heart disease Mother    Hypertension Mother    Hyperlipidemia Mother    CVA Father    Alzheimer's disease Father    Cancer Neg Hx    Colon cancer Neg Hx    Esophageal cancer Neg Hx    Rectal cancer Neg Hx    Stomach cancer Neg Hx     Prior to Admission medications   Medication Sig Start Date End Date Taking? Authorizing Provider  acetaminophen (TYLENOL) 325 MG tablet Take 1-2 tablets (325-650 mg total) by mouth every 4 (four) hours as needed for mild pain. 09/06/22   Setzer, Edman Circle, PA-C  ALPRAZolam Duanne Moron) 0.5 MG tablet Take 1 tablet (0.5 mg total) by mouth daily as needed for anxiety. 09/06/22   Setzer, Edman Circle, PA-C  aspirin EC 81 MG EC tablet Take 1 tablet (81 mg total) by mouth daily. Swallow whole. 12/31/20   Debbe Odea, MD  atorvastatin (LIPITOR) 80 MG tablet Take 1 tablet (80 mg total) by mouth daily. 09/06/22   Setzer, Edman Circle, PA-C  buPROPion (ZYBAN) 150 MG 12 hr tablet Take 150 mg by mouth daily as needed (to stop smoking).  01/09/22   [provider]  clopidogrel (PLAVIX) 75 MG tablet TAKE 1 TABLET EVERY DAY Patient taking differently: Take 75 mg by mouth daily. 07/23/22   Lorretta Harp, MD  diltiazem (CARDIZEM CD) 240 MG 24 hr capsule Take 1 capsule (240 mg total) by mouth daily. 09/07/22   Setzer, Edman Circle, PA-C  ezetimibe (ZETIA) 10 MG tablet Take 10 mg by mouth daily.    [provider]  famotidine (PEPCID) 40 MG tablet Take 40 mg by mouth at bedtime. 10/25/20   [provider]  melatonin 3 MG TABS tablet Take 1 tablet (3 mg total) by mouth at bedtime. 09/06/22   Setzer, Edman Circle, PA-C  mirtazapine (REMERON) 30 MG tablet Take 0.5 tablets (15 mg total) by mouth at bedtime. Patient taking differently: Take 30 mg by mouth at bedtime. 08/13/18   Argentina Donovan, PA-C  Multiple Vitamin (MULTIVITAMIN WITH MINERALS) TABS  tablet Take 1 tablet by mouth daily. 12/31/20   Debbe Odea, MD  nicotine (NICODERM CQ - DOSED IN MG/24 HOURS) 14 mg/24hr patch Place 14 mg onto the skin daily as needed (nicotine dependence).    [provider]  polyethylene glycol (MIRALAX / GLYCOLAX) 17 g packet Take 17 g by mouth daily as needed. 09/06/22   Setzer, Edman Circle, PA-C  senna-docusate (SENOKOT-S) 8.6-50 MG tablet Take 1 tablet by mouth 2 (two) times daily. 09/06/22   Setzer, Edman Circle, PA-C  tiZANidine (ZANAFLEX) 2 MG tablet TAKE 1 TABLET(2 MG) BY MOUTH AT BEDTIME 10/15/22   Kirsteins, Luanna Salk, MD  traMADol (ULTRAM) 50 MG tablet Take 1 tablet (50 mg total) by mouth every 6 (six) hours as needed for moderate pain. 09/06/22   Barbie Banner, PA-C    Physical Exam: Vitals:   11/29/22 2045 11/29/22 2100 11/29/22 2106 11/29/22 2200  BP: (!) 155/133 (!) 158/97  129/84  Pulse: 96 (!) 101  99  Resp: '19 20  13  '$ Temp:   97.6 F (36.4 C)   TempSrc:   Oral   SpO2: 100% 95%  100%    Physical Exam Vitals and nursing note reviewed.  Constitutional:      General: She is not in acute distress.     Appearance: She is not toxic-appearing.     Comments: Appears chronically ill. cachetic  HENT:     Head: Normocephalic.     Nose: Nose normal.  Cardiovascular:     Rate and Rhythm: Normal rate and regular rhythm.     Pulses: Normal pulses.  Pulmonary:     Effort: Pulmonary effort is normal. No respiratory distress.     Breath sounds: No wheezing.  Abdominal:     General: Abdomen is flat. Bowel sounds are normal. There is no distension.     Tenderness: There is no abdominal tenderness.  Skin:    General: Skin is warm and dry.     Capillary Refill: Capillary refill takes less than 2 seconds.  Neurological:     Mental Status: She is alert and oriented to person, place, and time.     Comments: Dense left hemiparesis      Labs on Admission: I have personally reviewed following labs and imaging studies  CBC: Recent Labs  Lab 11/29/22 2100  WBC 8.0  NEUTROABS 4.2  HGB 11.7*  HCT 36.8  MCV 89.8  PLT 147   Basic Metabolic Panel: Recent Labs  Lab 11/29/22 1800  NA 142  K 4.3  CL 104  CO2 22  GLUCOSE 79  BUN 11  CREATININE 0.97  CALCIUM 9.4   GFR: CrCl cannot be calculated (Unknown ideal weight.). Liver Function Tests: Recent Labs  Lab 11/29/22 1800  AST 22  ALT 11  ALKPHOS 52  BILITOT 1.0  PROT 6.6  ALBUMIN 3.5   Recent Labs  Lab 11/29/22 1800  LIPASE 33   No results for input(s): "AMMONIA" in the last 168 hours. Coagulation Profile: No results for input(s): "INR", "PROTIME" in the last 168 hours. Cardiac Enzymes: Recent Labs  Lab 11/29/22 1800 11/29/22 1945  TROPONINIHS 16 18*   BNP (last 3 results) No results for input(s): "PROBNP" in the last 8760 hours. HbA1C: No results for input(s): "HGBA1C" in the last 72 hours. CBG: No results for input(s): "GLUCAP" in the last 168 hours. Lipid Profile: No results for input(s): "CHOL", "HDL", "LDLCALC", "TRIG", "CHOLHDL", "LDLDIRECT" in the last 72 hours. Thyroid Function Tests: No results  for  input(s): "TSH", "T4TOTAL", "FREET4", "T3FREE", "THYROIDAB" in the last 72 hours. Anemia Panel: No results for input(s): "VITAMINB12", "FOLATE", "FERRITIN", "TIBC", "IRON", "RETICCTPCT" in the last 72 hours. Urine analysis:    Component Value Date/Time   COLORURINE YELLOW 05/28/2022 1152   APPEARANCEUR CLEAR 05/28/2022 1152   LABSPEC 1.011 05/28/2022 1152   PHURINE 5.0 05/28/2022 1152   GLUCOSEU NEGATIVE 05/28/2022 1152   HGBUR NEGATIVE 05/28/2022 1152   BILIRUBINUR NEGATIVE 05/28/2022 1152   BILIRUBINUR small 06/15/2014 1038   KETONESUR NEGATIVE 05/28/2022 1152   PROTEINUR NEGATIVE 05/28/2022 1152   UROBILINOGEN 0.2 06/15/2014 1038   UROBILINOGEN 1.0 05/04/2013 1637   NITRITE NEGATIVE 05/28/2022 1152   LEUKOCYTESUR NEGATIVE 05/28/2022 1152    Radiological Exams on Admission: I have personally reviewed images DG Chest Portable 1 View  Result Date: 11/29/2022 CLINICAL DATA:  dysphagia EXAM: PORTABLE CHEST 1 VIEW COMPARISON:  August 31, 2014 FINDINGS: The cardiomediastinal silhouette is unchanged in contour.Atherosclerotic calcifications. No pleural effusion. No pneumothorax. No acute pleuroparenchymal abnormality. IMPRESSION: No acute cardiopulmonary abnormality. Electronically Signed   By: Valentino Saxon M.D.   On: 11/29/2022 18:15    EKG: My personal interpretation of EKG shows: sinus tachycardia    Assessment/Plan Principal Problem:   Dysphagia as late effect of cerebrovascular accident (CVA) Active Problems:   Essential hypertension   Left hemiplegia (HCC)   History of stroke    Assessment and Plan: * Dysphagia as late effect of cerebrovascular accident (CVA) Observation med/surg bed. Pt has not been able to tolerate liquids, pureed diet or any other form of nutrition. Unable to swallow any of her pills for at least 1 month. Pt and family desire PEG tube. Pt has not been on plavix for about 1 month. Will need nutrition consult for enteral formula recommendations.  Dtr(melissa) available to learn how to give enteral formula via PEG. Will place IV consult. SCDs for DVT prophylaxis. IVF overnight.  History of stroke Chronic.  Left hemiplegia (HCC) Chronic.  Essential hypertension Has not had BP meds for over 1 month due to dysphagia.   DVT prophylaxis: SCDs Code Status: Full Code Family Communication: discussed with pt, son ricky, dtr melissa  Disposition Plan: return home  Consults called: none  Admission status: Observation, Med-Surg   Kristopher Oppenheim, DO [facility] 11/29/2022, 10:36 PM

## 2022-11-29 NOTE — ED Provider Notes (Signed)
[facility] DEPT Provider Note  CSN: [id number] Arrival date & time: 11/29/22 1641  Chief Complaint(s) Dysphagia  HPI Kara Bailey is a 67 y.o. female with past medical history as below, significant for cirrhosis, emphysema, GERD, HCV, PAD, CVA with left-sided residual deficit, zoster polyneuropathy who presents to the ED with complaint of difficulty swallowing.  Patient reports since her most recent stroke she has had difficulty swallowing, is gotten progressively worse over the past 2 months.  Significantly worse in the past week.  Reports has not been to take her medications in over a month.  Still able to tolerate small sips of liquids over the past few weeks, really unable to tolerate much solid food.  She been having diarrhea over the past few days, some exertional dyspnea that she feels is worsened from her baseline over the past week as well.  No fevers or chills.  Having some mild nausea without vomiting.  She has discomfort to her epigastrium when she swallows.  Was seen at PCP office earlier today and sent to the ED for evaluation.  Past Medical History Past Medical History:  Diagnosis Date   Acute CVA (cerebrovascular accident) ([place]) 12/29/2020   Anxiety    Chest pain 08/2014   unspecified   Cirrhosis ([place])    CVA (cerebral vascular accident) ([place]) 07/27/2022   Depression    Dizziness 08/2014   Dyspnea 08/2014   Dysrhythmia    Emphysema lung (HCC)    GERD (gastroesophageal reflux disease)    Heart murmur    Hepatitis C    Hiatal hernia    Hypertension    Ischemic stroke ([place]) 08/01/2022   PAC (premature atrial contraction) 10/11/2014   PAD (peripheral artery disease) (HCC)    Palpitations    PVC (premature ventricular contraction) 10/11/2014   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   Stroke ([place])    just slurred speech   Tachycardia 08/2014   Ventral hernia    Weakness 08/2014   Patient Active Problem List    Diagnosis Date Noted   History of stroke 11/29/2022   Dysphagia as late effect of cerebrovascular accident (CVA) 11/29/2022   Stroke ([place]) 07/26/2022   Left hemiplegia ([place]) 07/26/2022   Peripheral arterial disease ([place]) 12/20/2020   History of esophageal stricture 07/08/2020   Insomnia 12/02/2017   Chronic cough 08/14/2017   Poor appetite 08/29/2016   Peptic stricture of esophagus    History of colonic polyps    Hiatal hernia 03/16/2016   Dysphagia 03/16/2016   Alcohol abuse 10/26/2015   Essential hypertension 09/26/2015   PAC (premature atrial contraction) 10/11/2014   PVC (premature ventricular contraction) 10/11/2014   Back muscle spasm 06/15/2014   Generalized anxiety disorder 06/15/2014   Smoking 06/15/2014   Chronic hepatitis C without hepatic coma ([place]) 05/28/2013   Home Medication(s) Prior to Admission medications   Medication Sig Start Date End Date Taking? Authorizing Provider  albuterol (VENTOLIN HFA) 108 (90 Base) MCG/ACT inhaler Inhale 2 puffs into the lungs every 6 (six) hours as needed for wheezing or shortness of breath.   Yes [provider]  aspirin EC 81 MG EC tablet Take 1 tablet (81 mg total) by mouth daily. Swallow whole. 12/31/20  Yes Debbe Odea, MD  atorvastatin (LIPITOR) 80 MG tablet Take 1 tablet (80 mg total) by mouth daily. 09/06/22  Yes Setzer, [address], PA-C  Baclofen 5 MG TABS Take 1 tablet by mouth every 8 (eight) hours as needed (pain). 11/24/22  Yes [provider]  citalopram (CELEXA) 10 MG tablet Take 10 mg by mouth every morning. 10/31/22  Yes [provider]  clopidogrel (PLAVIX) 75 MG tablet TAKE 1 TABLET EVERY DAY Patient taking differently: Take 75 mg by mouth daily. 07/23/22  Yes Lorretta Harp, MD  diltiazem (CARDIZEM CD) 240 MG 24 hr capsule Take 1 capsule (240 mg total) by mouth daily. 09/07/22  Yes Setzer, [address], PA-C  ezetimibe (ZETIA) 10 MG tablet Take 10 mg by mouth daily.   Yes [provider]  famotidine (PEPCID) 40 MG tablet Take 40 mg by mouth at bedtime. 10/25/20  Yes [provider]  hydroxypropyl methylcellulose / hypromellose (ISOPTO TEARS / GONIOVISC) 2.5 % ophthalmic solution Place 1 drop into both eyes in the morning and at bedtime.   Yes [provider]  hydrOXYzine (ATARAX) 50 MG tablet Take 50 mg by mouth every 8 (eight) hours as needed for anxiety.   Yes [provider]  mirtazapine (REMERON) 15 MG tablet Take 15 mg by mouth at bedtime. 09/27/22  Yes [provider]  Multiple Vitamin (MULTIVITAMIN WITH MINERALS) TABS tablet Take 1 tablet by mouth daily. 12/31/20  Yes Debbe Odea, MD  thiamine (VITAMIN B-1) 100 MG tablet Take 100 mg by mouth daily.   Yes [provider]  acetaminophen (TYLENOL) 325 MG tablet Take 1-2 tablets (325-650 mg total) by mouth every 4 (four) hours as needed for mild pain. Patient not taking: Reported on 11/29/2022 09/06/22   Vaughan Basta, [address], PA-C  ALPRAZolam Duanne Moron) 0.5 MG tablet Take 1 tablet (0.5 mg total) by mouth daily as needed for anxiety. Patient not taking: Reported on 11/29/2022 09/06/22   Barbie Banner, PA-C  melatonin 3 MG TABS tablet Take 1 tablet (3 mg total) by mouth at bedtime. Patient not taking: Reported on 11/29/2022 09/06/22   Barbie Banner, PA-C  mirtazapine (REMERON) 30 MG tablet Take 0.5 tablets (15 mg total) by mouth at bedtime. Patient not taking: Reported on 11/29/2022 08/13/18   Argentina Donovan, PA-C  polyethylene glycol (MIRALAX / GLYCOLAX) 17 g packet Take 17 g by mouth daily as needed. Patient not taking: Reported on 11/29/2022 09/06/22   Vaughan Basta, [address], PA-C  senna-docusate (SENOKOT-S) 8.6-50 MG tablet Take 1 tablet by mouth 2 (two) times daily. Patient not taking: Reported on 11/29/2022 09/06/22   Barbie Banner, PA-C  tiZANidine (ZANAFLEX) 2 MG tablet TAKE 1 TABLET(2 MG) BY MOUTH AT BEDTIME Patient not taking: Reported on 11/29/2022 10/15/22   Charlett Blake,  MD  traMADol (ULTRAM) 50 MG tablet Take 1 tablet (50 mg total) by mouth every 6 (six) hours as needed for moderate pain. Patient not taking: Reported on 11/29/2022 09/06/22   Barbie Banner, PA-C  Past Surgical History Past Surgical History:  Procedure Laterality Date   ABDOMINAL AORTOGRAM W/LOWER EXTREMITY N/A 07/13/2021   Procedure: ABDOMINAL AORTOGRAM W/LOWER EXTREMITY;  Surgeon: Lorretta Harp, MD;  Location: [facility];  Service: Cardiovascular;  Laterality: N/A;   ABDOMINAL AORTOGRAM W/LOWER EXTREMITY N/A 03/19/2022   Procedure: ABDOMINAL AORTOGRAM W/LOWER EXTREMITY;  Surgeon: Lorretta Harp, MD;  Location: [facility];  Service: Cardiovascular;  Laterality: N/A;   APPENDECTOMY     CATARACT EXTRACTION, BILATERAL     COLONOSCOPY     COLONOSCOPY WITH PROPOFOL N/A 04/30/2016   Procedure: COLONOSCOPY WITH PROPOFOL;  Surgeon: Daneil Dolin, MD;  [facility];  Service: Endoscopy;  Laterality: N/A;  [place] N/A 10/13/2021   Procedure: OPEN REPAIR EPIGASTRIC HERNIA WITH MESH PATCH;  Surgeon: Armandina Gemma, MD;  Location: [facility];  Service: General;  Laterality: N/A;   ESOPHAGOGASTRODUODENOSCOPY     approximately 2010   ESOPHAGOGASTRODUODENOSCOPY (EGD) WITH PROPOFOL N/A 04/30/2016   Procedure: ESOPHAGOGASTRODUODENOSCOPY (EGD) WITH PROPOFOL;  Surgeon: Daneil Dolin, MD;  [facility];  Service: Endoscopy;  Laterality: N/A;   LEFT HEART CATHETERIZATION WITH CORONARY ANGIOGRAM N/A 10/29/2014   07-14-20- pt denies this Procedure: LEFT HEART CATHETERIZATION WITH CORONARY ANGIOGRAM;  Surgeon: Burnell Blanks, MD;  Location[facility];  Service: Cardiovascular;  Laterality: N/A;   MALONEY DILATION N/A 04/30/2016   Procedure: Venia Minks DILATION;  Surgeon: Daneil Dolin, MD;  [facility];  Service:  Endoscopy;  Laterality: N/A;   PERIPHERAL VASCULAR INTERVENTION  07/13/2021   Procedure: PERIPHERAL VASCULAR INTERVENTION;  Surgeon: Lorretta Harp, MD;  Location: [facility];  Service: Cardiovascular;;  left SFA left external iliac   PERIPHERAL VASCULAR INTERVENTION Right 03/19/2022   Procedure: PERIPHERAL VASCULAR INTERVENTION;  Surgeon: Lorretta Harp, MD;  Location: [facility];  Service: Cardiovascular;  Laterality: Right;  SFA   POLYPECTOMY  04/30/2016   Procedure: POLYPECTOMY;  Surgeon: Daneil Dolin, MD;  [facility];  Service: Endoscopy;;  Sigmoid colon polyp removed via hot snare   UPPER GASTROINTESTINAL ENDOSCOPY     Family History Family History  Problem Relation Age of Onset   Heart failure Mother    Heart attack Mother    Heart disease Mother    Hypertension Mother    Hyperlipidemia Mother    CVA Father    Alzheimer's disease Father    Cancer Neg Hx    Colon cancer Neg Hx    Esophageal cancer Neg Hx    Rectal cancer Neg Hx    Stomach cancer Neg Hx     Social History Social History   Tobacco Use   Smoking status: Every Day    Packs/day: 0.50    Years: 40.00    Total pack years: 20.00    Types: Cigarettes    Start date: 11/12/1968   Smokeless tobacco: Never   Tobacco comments:    cutting back, wearing patches  Vaping Use   Vaping Use: Never used  Substance Use Topics   Alcohol use: Not Currently    Comment: beer on weekends (6-pack)   Drug use: No   Allergies Patient has no known allergies.  Review of Systems Review of Systems  Constitutional:  Negative for chills and fever.  HENT:  Positive for trouble swallowing. Negative for facial swelling.   Eyes:  Negative for photophobia and visual disturbance.  Respiratory:  Positive for shortness  of breath. Negative for cough.   Cardiovascular:  Negative for chest pain and palpitations.  Gastrointestinal:  Positive for abdominal pain and nausea. Negative for vomiting.   Endocrine: Negative for polydipsia and polyuria.  Genitourinary:  Negative for difficulty urinating and hematuria.  Musculoskeletal:  Negative for gait problem and joint swelling.  Skin:  Negative for pallor and rash.  Neurological:  Negative for syncope and headaches.  Psychiatric/Behavioral:  Negative for agitation and confusion.     Physical Exam Vital Signs  I have reviewed the triage vital signs BP 109/66   Pulse (!) 104   Temp 97.6 F (36.4 C) (Oral)   Resp (!) 9   SpO2 96%  Physical Exam Vitals and nursing note reviewed.  Constitutional:      General: She is not in acute distress.    Appearance: Normal appearance.     Comments: Cachectic  HENT:     Head: Normocephalic and atraumatic.     Comments: No drooling stridor or trismus     Right Ear: External ear normal.     Left Ear: External ear normal.     Nose: Nose normal.     Mouth/Throat:     Mouth: Mucous membranes are moist.  Eyes:     General: No scleral icterus.       Right eye: No discharge.        Left eye: No discharge.     Extraocular Movements: Extraocular movements intact.     Pupils: Pupils are equal, round, and reactive to light.  Cardiovascular:     Rate and Rhythm: Normal rate and regular rhythm.     Pulses: Normal pulses.     Heart sounds: Normal heart sounds.  Pulmonary:     Effort: Pulmonary effort is normal. No respiratory distress.     Breath sounds: Normal breath sounds.  Abdominal:     General: Abdomen is flat.     Palpations: Abdomen is soft.     Tenderness: There is no abdominal tenderness.     Comments: Scaphoid  Musculoskeletal:        General: Normal range of motion.     Cervical back: Normal range of motion.     Right lower leg: No edema.     Left lower leg: No edema.  Skin:    General: Skin is warm and dry.     Capillary Refill: Capillary refill takes less than 2 seconds.  Neurological:     Mental Status: She is alert and oriented to person, place, and time. Mental status  is at baseline.     GCS: GCS eye subscore is 4. GCS verbal subscore is 5. GCS motor subscore is 6.     Comments: Left-sided upper extremity contracture Left side residual deficit chronic from prior stroke per family and patient LUE contracture    Psychiatric:        Mood and Affect: Mood normal.        Behavior: Behavior normal.     ED Results and Treatments Labs (all labs ordered are listed, but only abnormal results are displayed) Labs Reviewed  COMPREHENSIVE METABOLIC PANEL - Abnormal; Notable for the following components:      Result Value   Anion gap 16 (*)    All other components within normal limits  CBC WITH DIFFERENTIAL/PLATELET - Abnormal; Notable for the following components:   Hemoglobin 11.7 (*)    RDW 16.7 (*)    Eosinophils Absolute 0.6 (*)    All other components  within normal limits  TROPONIN I (HIGH SENSITIVITY) - Abnormal; Notable for the following components:   Troponin I (High Sensitivity) 18 (*)    All other components within normal limits  BRAIN NATRIURETIC PEPTIDE  LIPASE, BLOOD  CBC WITH DIFFERENTIAL/PLATELET  URINALYSIS, ROUTINE W REFLEX MICROSCOPIC  TROPONIN I (HIGH SENSITIVITY)                                                                                                                          Radiology DG Chest Portable 1 View  Result Date: 11/29/2022 CLINICAL DATA:  dysphagia EXAM: PORTABLE CHEST 1 VIEW COMPARISON:  August 31, 2014 FINDINGS: The cardiomediastinal silhouette is unchanged in contour.Atherosclerotic calcifications. No pleural effusion. No pneumothorax. No acute pleuroparenchymal abnormality. IMPRESSION: No acute cardiopulmonary abnormality. Electronically Signed   By: Valentino Saxon M.D.   On: 11/29/2022 18:15    Pertinent labs & imaging results that were available during my care of the patient were reviewed by me and considered in my medical decision making (see MDM for details).  Medications Ordered in ED Medications   lactated ringers infusion ( Intravenous New Bag/Given 11/29/22 2242)  morphine (PF) 2 MG/ML injection 2 mg (2 mg Intravenous Given 11/29/22 2233)  ondansetron (ZOFRAN) injection 4 mg (4 mg Intravenous Given 11/29/22 2233)  sodium chloride 0.9 % bolus 1,000 mL (0 mLs Intravenous Stopped 11/30/22 0019)                                                                                                                                     Procedures Procedures  (including critical care time)  Medical Decision Making / ED Course   MDM:  Kara Bailey is a 67 y.o. female with past medical history as below, significant for cirrhosis, emphysema, GERD, HCV, PAD, CVA with left-sided residual deficit, zoster polyneuropathy who presents to the ED with complaint of difficulty swallowing.. The complaint involves an extensive differential diagnosis and also carries with it a high risk of complications and morbidity.  Serious etiology was considered. Ddx includes but is not limited to: In my evaluation of this patient's dyspnea my DDx includes, but is not limited to, pneumonia, pulmonary embolism, pneumothorax, pulmonary edema, metabolic acidosis, asthma, COPD, cardiac cause, anemia, anxiety, etc.   Differential diagnosis includes but is not exclusive to acute cholecystitis, intrathoracic causes for epigastric abdominal pain, gastritis, duodenitis, pancreatitis, small bowel or large bowel  obstruction, abdominal aortic aneurysm, hernia, gastritis, etc.   On initial assessment the patient is: Resting comfortably Vital signs and nursing notes were reviewed    Labs reviewed and are stable, chest x-ray stable.  Patient likely dysphagia secondary to her prior CVA.  Is been progressive process over the past few months.  Patient able to tolerate only very small sips liquids , unable to take her medications or tolerate solid food.  Discussed family, they would like patient to be evaluated for PEG tube.  Will get  patient hydrated overnight, will plan to admit to hospitalist service to recommend IR evaluate tomorrow for possible PEG tube placement.  Family is concered with difficulty in caring for her at home, would like to d/w sw in regards to possible snf placement. Will defer to inpatient team in regards to this   Spoke w/ Dr Bridgett Larsson who accepts pt for admit    Additional history obtained: -Additional history obtained from family -External records from outside source obtained and reviewed including: Chart review including previous notes, labs, imaging, consultation notes including prior labs and imaging, prior mission documentation, home medications, primary care documentation   Lab Tests: -I ordered, reviewed, and interpreted labs.   The pertinent results include:   Labs Reviewed  COMPREHENSIVE METABOLIC PANEL - Abnormal; Notable for the following components:      Result Value   Anion gap 16 (*)    All other components within normal limits  CBC WITH DIFFERENTIAL/PLATELET - Abnormal; Notable for the following components:   Hemoglobin 11.7 (*)    RDW 16.7 (*)    Eosinophils Absolute 0.6 (*)    All other components within normal limits  TROPONIN I (HIGH SENSITIVITY) - Abnormal; Notable for the following components:   Troponin I (High Sensitivity) 18 (*)    All other components within normal limits  BRAIN NATRIURETIC PEPTIDE  LIPASE, BLOOD  CBC WITH DIFFERENTIAL/PLATELET  URINALYSIS, ROUTINE W REFLEX MICROSCOPIC  TROPONIN I (HIGH SENSITIVITY)    Notable for is 18, EKG normal.  EKG   EKG Interpretation  Date/Time:    Ventricular Rate:    PR Interval:    QRS Duration:   QT Interval:    QTC Calculation:   R Axis:     Text Interpretation:           Imaging Studies ordered: I ordered imaging studies including chest x-ray I independently visualized the following imaging with scope of interpretation limited to determining acute life threatening conditions related to emergency care:  Chest x-ray stable I independently visualized and interpreted imaging. I agree with the radiologist interpretation   Medicines ordered and prescription drug management: Meds ordered this encounter  Medications   morphine (PF) 2 MG/ML injection 2 mg   ondansetron (ZOFRAN) injection 4 mg   DISCONTD: sodium chloride 0.9 % bolus 500 mL   sodium chloride 0.9 % bolus 1,000 mL   lactated ringers infusion    -I have reviewed the patients home medicines and have made adjustments as needed   Consultations Obtained: Not applicable  Cardiac Monitoring: The patient was maintained on a cardiac monitor.  I personally viewed and interpreted the cardiac monitored which showed an underlying rhythm of: NSR  Social Determinants of Health:  Diagnosis or treatment significantly limited by social determinants of health: former smoker   Reevaluation: After the interventions noted above, I reevaluated the patient and found that they have stayed the same  Co morbidities that complicate the patient evaluation  Past Medical History:  Diagnosis Date   Acute CVA (cerebrovascular accident) ([place]) 12/29/2020   Anxiety    Chest pain 08/2014   unspecified   Cirrhosis ([place])    CVA (cerebral vascular accident) ([place]) 07/27/2022   Depression    Dizziness 08/2014   Dyspnea 08/2014   Dysrhythmia    Emphysema lung (HCC)    GERD (gastroesophageal reflux disease)    Heart murmur    Hepatitis C    Hiatal hernia    Hypertension    Ischemic stroke ([place]) 08/01/2022   PAC (premature atrial contraction) 10/11/2014   PAD (peripheral artery disease) (H[facility])    Palpitations    PVC (premature ventricular contraction) 10/11/2014   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    Final Clinical Impression(s) / ED Diagnoses Final diagnoses:  Dysphagia, unspecified type  Failure to thrive in adult  History of stroke     This chart was dictated using voice recognition software.  Despite best efforts to proofread,  errors can occur which can change the documentation meaning.    Jeanell Sparrow, DO 11/30/22 [phone]

## 2022-11-29 NOTE — ED Notes (Signed)
Cleaned pt with water and soap had a small bm, applied barrier cream to bottom and put a gown on and warm blankets

## 2022-11-29 NOTE — Assessment & Plan Note (Signed)
Has not had BP meds for over 1 month due to dysphagia.

## 2022-11-29 NOTE — Subjective & Objective (Addendum)
CC: dysphagia HPI: 67 year old African-American female history of multiple strokes, dense left hemiparesis, hypertension who presents to the ER today with worsening dysphagia.  Family has been trying to feed her with various levels of liquids and consistencies without success.  Patient has become tolerant to swallowing any pills.  She can only tolerate small sips of liquids.  Patient was seen by her PCP and sent to ER today for for PEG tube placement.  Family and patient are agreeable to PEG tube placement.  Patient not had Plavix in over a month now.  [facility] hospitalist contacted for admission.

## 2022-11-30 ENCOUNTER — Observation Stay (HOSPITAL_COMMUNITY): Payer: Medicare HMO

## 2022-11-30 DIAGNOSIS — I69391 Dysphagia following cerebral infarction: Secondary | ICD-10-CM | POA: Diagnosis not present

## 2022-11-30 LAB — URINALYSIS, ROUTINE W REFLEX MICROSCOPIC
Glucose, UA: NEGATIVE mg/dL
Ketones, ur: 20 mg/dL — AB
Nitrite: NEGATIVE
Protein, ur: 30 mg/dL — AB
Specific Gravity, Urine: 1.024 (ref 1.005–1.030)
pH: 5 (ref 5.0–8.0)

## 2022-11-30 LAB — PREALBUMIN: Prealbumin: 9 mg/dL — ABNORMAL LOW (ref 18–38)

## 2022-11-30 MED ORDER — CEFAZOLIN SODIUM-DEXTROSE 2-4 GM/100ML-% IV SOLN
2.0000 g | INTRAVENOUS | Status: AC
  Filled 2022-11-30 (×2): qty 100

## 2022-11-30 MED ORDER — FREE WATER
100.0000 mL | Status: DC
  Administered 2022-11-30 – 2023-01-14 (×235): 100 mL

## 2022-11-30 MED ORDER — ACETAMINOPHEN 650 MG RE SUPP
650.0000 mg | RECTAL | Status: DC | PRN
  Administered 2022-12-07: 650 mg via RECTAL
  Filled 2022-11-30 (×2): qty 1

## 2022-11-30 MED ORDER — OSMOLITE 1.5 CAL PO LIQD
1000.0000 mL | ORAL | Status: DC
  Administered 2022-11-30 – 2022-12-01 (×2): 1000 mL
  Filled 2022-11-30 (×6): qty 1000

## 2022-11-30 MED ORDER — ONDANSETRON HCL 4 MG/2ML IJ SOLN
4.0000 mg | Freq: Four times a day (QID) | INTRAMUSCULAR | Status: DC | PRN
  Administered 2022-12-01 – 2023-01-02 (×5): 4 mg via INTRAVENOUS
  Filled 2022-11-30 (×7): qty 2

## 2022-11-30 MED ORDER — ONDANSETRON HCL 4 MG PO TABS
4.0000 mg | ORAL_TABLET | Freq: Four times a day (QID) | ORAL | Status: DC | PRN
  Administered 2022-12-06 – 2022-12-12 (×4): 4 mg via ORAL
  Filled 2022-11-30 (×4): qty 1

## 2022-11-30 MED ORDER — DEXTROSE IN LACTATED RINGERS 5 % IV SOLN: INTRAVENOUS | Status: AC

## 2022-11-30 MED ORDER — MORPHINE SULFATE (PF) 2 MG/ML IV SOLN
2.0000 mg | INTRAVENOUS | Status: DC | PRN
  Administered 2022-11-30 – 2022-12-03 (×11): 2 mg via INTRAVENOUS
  Filled 2022-11-30 (×11): qty 1

## 2022-11-30 MED ORDER — IOHEXOL 300 MG/ML  SOLN
100.0000 mL | Freq: Once | INTRAMUSCULAR | Status: AC | PRN
  Administered 2022-12-08: 100 mL

## 2022-11-30 MED ORDER — DICLOFENAC SODIUM 1 % EX GEL
4.0000 g | Freq: Four times a day (QID) | CUTANEOUS | Status: DC
  Administered 2022-11-30 – 2023-01-13 (×122): 4 g via TOPICAL
  Filled 2022-11-30 (×2): qty 100

## 2022-11-30 NOTE — Progress Notes (Signed)
PROGRESS NOTE    Kara Bailey  DUK:[id number] DOB: December 20, 1955 DOA: 11/29/2022 PCP: Andree Moro, DO     Brief Narrative:  H/o esophageal stenosis required dilation in 2017 and 2021, h/o HTN, HLD,    Subjective:  Reports not able to tolerate oral intake , even water  Reports chronic left side pain from prior CVA  Assessment & Plan:  Principal Problem:   Dysphagia as late effect of cerebrovascular accident (CVA) Active Problems:   Essential hypertension   Left hemiplegia (HCC)   History of stroke    Assessment and Plan:  * Dysphagia likely multifactorial   late effect of cerebrovascular accident (CVA)  Also has h/o esophageal dysphagia due to esophageal stenosis - Pt has not been able to tolerate liquids, pureed diet or any other form of nutrition. Unable to swallow any of her pills for at least 1 month.   -Pt has not been on plavix for about 1 month.  -Will get Dg esophagus, may need to involve GI  -Family desire G tube placement, IR consulted who requested Ct ab/pel without contrast to eval anatomy, order placed -.has been made npo since admission, continue LR infusion , will need to watch refeeding syndrome once start tube feeds as she has not eat much for a month  History of stroke/Left hemiplegia  Has not been able to tolerate meds by mouth for the last month Chronic pain , reports from prior stroke, reports iv morphine helped  Essential hypertension/HLD Has not had BP meds for over 1 month due to dysphagia. Bp stable currently   H/o cirrhosis H/o hepc   Alcohol use Sixpack beer on the weekends  Current everyday smoker Trying to cut back wearing patches   Body mass index is 18.22 kg/m. Weight 57kg two month ago, today on 1/19 she weigh 51.2 kg         .   I have Reviewed nursing notes, Vitals, pain scores, I/o's, Lab results and  imaging results since pt's last encounter, details please see discussion above  I ordered the following labs:   Unresulted Labs (From admission, onward)     Start     Ordered   12/01/22 3762  Basic metabolic panel  Daily,   R      11/30/22 1842   12/01/22 0500  Phosphorus  5A & 5P,   R (with TIMED occurrences)      11/30/22 1842   12/01/22 0500  Magnesium  5A & 5P,   R (with TIMED occurrences)      11/30/22 1842   11/30/22 0151  HIV Antibody (routine testing w rflx)  (HIV Antibody (Routine testing w reflex) panel)  Add-on,   AD        11/30/22 0150   11/30/22 0151  Prealbumin  Add-on,   AD        11/30/22 0150   11/29/22 1745  CBC with Differential  (ED Shortness of Breath)  Once,   STAT        11/29/22 1745             DVT prophylaxis: SCDs Start: 11/30/22 0151   Code Status:   Code Status: Full Code  Family Communication: none at bedside  Disposition:   Status is: Observation   Dispo: The patient is from: home              Anticipated d/c is to: home              Anticipated  d/c date is: some time next week after cleared by IR and able to tolerate tube feeds  Antimicrobials:    Anti-infectives (From admission, onward)    Start     Dose/Rate Route Frequency Ordered Stop   12/03/22 0700  ceFAZolin (ANCEF) IVPB 2g/100 mL premix        2 g 200 mL/hr over 30 Minutes Intravenous To Radiology 11/30/22 1602 12/04/22 0700          Objective: Vitals:   11/30/22 1330 11/30/22 1400 11/30/22 1444 11/30/22 1602  BP: 100/86 (!) 140/65  127/73  Pulse: 97  87 75  Resp: '18 19 18 16  '$ Temp:   (!) 97.5 F (36.4 C) 98.1 F (36.7 C)  TempSrc:   Oral Oral  SpO2: 99%  100% 99%  Weight:    51.2 kg  Height:    '5\' 6"'$  (1.676 m)    Intake/Output Summary (Last 24 hours) at 11/30/2022 1919 Last data filed at 11/30/2022 1600 Gross per 24 hour  Intake 1008.16 ml  Output --  Net 1008.16 ml   Filed Weights   11/30/22 1602  Weight: 51.2 kg    Examination:  General exam: alert, awake, communicative,calm, NAD Respiratory system: Clear to auscultation. Respiratory effort  normal. Cardiovascular system:  RRR.  Gastrointestinal system: Abdomen is nondistended, soft and nontender.  Normal bowel sounds heard. Central nervous system: Alert and oriented.  Dysarthria, left hemiplegia. Extremities:  no edema Skin: No rashes, lesions or ulcers Psychiatry: Judgement and insight appear normal. Mood & affect appropriate.     Data Reviewed: I have personally reviewed  labs and visualized  imaging studies since the last encounter and formulate the plan        Scheduled Meds:  diclofenac Sodium  4 g Topical QID   free water  100 mL Per Tube Q4H   Continuous Infusions:  [START ON 12/03/2022]  ceFAZolin (ANCEF) IV     dextrose 5% lactated ringers 75 mL/hr at 11/30/22 1818   feeding supplement (OSMOLITE 1.5 CAL)     lactated ringers Stopped (11/30/22 1307)     LOS: 0 days   Time spent: 35mns  FFlorencia Reasons MD PhD FACP [facility]  Available via Epic secure chat 7am-7pm for nonurgent issues Please page for urgent issues To page the attending provider between 7A-7P or the covering provider during after hours 7P-7A, please log into the web site [email or web address] and access using universal [place] password for that web site. If you do not have the password, please call the hospital operator.    11/30/2022, 7:19 PM

## 2022-11-30 NOTE — Consult Note (Signed)
Chief Complaint: Patient was seen in consultation today for  Chief Complaint  Patient presents with   Dysphagia   at the request of hospitalist service  Referring Physician(s): Kristopher Oppenheim, DO  Supervising Physician: Michaelle Birks  Patient Status: [facility] - In-pt  History of Present Illness: Kara Bailey is a 67 y.o. female inpatient with history of multiple strokes, left hemiparesis, and hypertension.  She was admitted through the ED after visiting with her PCP.  Her swallowing has become progressively more painful and she is taking in less and less nutrition.  She has discontinued all pills and tolerates only small sips of liquids.  This is believed to be delayed sequelae of a CVA.  IR consulted for G-tube placement.   Past Medical History:  Diagnosis Date   Acute CVA (cerebrovascular accident) ([place]) 12/29/2020   Anxiety    Chest pain 08/2014   unspecified   Cirrhosis ([place])    CVA (cerebral vascular accident) ([place]) 07/27/2022   Depression    Dizziness 08/2014   Dyspnea 08/2014   Dysrhythmia    Emphysema lung (HCC)    GERD (gastroesophageal reflux disease)    Heart murmur    Hepatitis C    Hiatal hernia    Hypertension    Ischemic stroke ([place]) 08/01/2022   PAC (premature atrial contraction) 10/11/2014   PAD (peripheral artery disease) (HCC)    Palpitations    PVC (premature ventricular contraction) 10/11/2014   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   Stroke ([place])    just slurred speech   Tachycardia 08/2014   Ventral hernia    Weakness 08/2014    Past Surgical History:  Procedure Laterality Date   ABDOMINAL AORTOGRAM W/LOWER EXTREMITY N/A 07/13/2021   Procedure: ABDOMINAL AORTOGRAM W/LOWER EXTREMITY;  Surgeon: Lorretta Harp, MD;  Location: [place] [facility];  Service: Cardiovascular;  Laterality: N/A;   ABDOMINAL AORTOGRAM W/LOWER EXTREMITY N/A 03/19/2022   Procedure: ABDOMINAL AORTOGRAM W/LOWER EXTREMITY;  Surgeon: Lorretta Harp, MD;   Location: [place] [facility];  Service: Cardiovascular;  Laterality: N/A;   APPENDECTOMY     CATARACT EXTRACTION, BILATERAL     COLONOSCOPY     COLONOSCOPY WITH PROPOFOL N/A 04/30/2016   Procedure: COLONOSCOPY WITH PROPOFOL;  Surgeon: Daneil Dolin, MD;  [facility];  Service: Endoscopy;  Laterality: N/A;  [place] N/A 10/13/2021   Procedure: OPEN REPAIR EPIGASTRIC HERNIA WITH MESH PATCH;  Surgeon: Armandina Gemma, MD;  Location: [facility];  Service: General;  Laterality: N/A;   ESOPHAGOGASTRODUODENOSCOPY     approximately 2010   ESOPHAGOGASTRODUODENOSCOPY (EGD) WITH PROPOFOL N/A 04/30/2016   Procedure: ESOPHAGOGASTRODUODENOSCOPY (EGD) WITH PROPOFOL;  Surgeon: Daneil Dolin, MD;  [facility];  Service: Endoscopy;  Laterality: N/A;   LEFT HEART CATHETERIZATION WITH CORONARY ANGIOGRAM N/A 10/29/2014   07-14-20- pt denies this Procedure: LEFT HEART CATHETERIZATION WITH CORONARY ANGIOGRAM;  Surgeon: Burnell Blanks, MD;  Location[facility] C[facility] LAB;  Service: Cardiovascular;  Laterality: N/A;   MALONEY DILATION N/A 04/30/2016   Procedure: Venia Minks DILATION;  Surgeon: Daneil Dolin, MD;  [facility];  Service: Endoscopy;  Laterality: N/A;   PERIPHERAL VASCULAR INTERVENTION  07/13/2021   Procedure: PERIPHERAL VASCULAR INTERVENTION;  Surgeon: Lorretta Harp, MD;  Location: [place] [facility];  Service: Cardiovascular;;  left SFA left external iliac   PERIPHERAL VASCULAR INTERVENTION Right 03/19/2022   Procedure: PERIPHERAL VASCULAR INTERVENTION;  Surgeon: Lorretta Harp, MD;  Location:  [place] INVASIVE CV LAB;  Service: Cardiovascular;  Laterality: Right;  SFA   POLYPECTOMY  04/30/2016   Procedure: POLYPECTOMY;  Surgeon: Daneil Dolin, MD;  [facility];  Service: Endoscopy;;  Sigmoid colon polyp removed via hot snare   UPPER GASTROINTESTINAL ENDOSCOPY      Allergies: Patient has no known allergies.  Medications: Prior to Admission  medications   Medication Sig Start Date End Date Taking? Authorizing Provider  albuterol (VENTOLIN HFA) 108 (90 Base) MCG/ACT inhaler Inhale 2 puffs into the lungs every 6 (six) hours as needed for wheezing or shortness of breath.   Yes [provider]  aspirin EC 81 MG EC tablet Take 1 tablet (81 mg total) by mouth daily. Swallow whole. 12/31/20  Yes Debbe Odea, MD  atorvastatin (LIPITOR) 80 MG tablet Take 1 tablet (80 mg total) by mouth daily. 09/06/22  Yes Setzer, [address], PA-C  Baclofen 5 MG TABS Take 1 tablet by mouth every 8 (eight) hours as needed (pain). 11/24/22  Yes [provider]  citalopram (CELEXA) 10 MG tablet Take 10 mg by mouth every morning. 10/31/22  Yes [provider]  clopidogrel (PLAVIX) 75 MG tablet TAKE 1 TABLET EVERY DAY Patient taking differently: Take 75 mg by mouth daily. 07/23/22  Yes Lorretta Harp, MD  diltiazem (CARDIZEM CD) 240 MG 24 hr capsule Take 1 capsule (240 mg total) by mouth daily. 09/07/22  Yes Setzer, [address], PA-C  ezetimibe (ZETIA) 10 MG tablet Take 10 mg by mouth daily.   Yes [provider]  famotidine (PEPCID) 40 MG tablet Take 40 mg by mouth at bedtime. 10/25/20  Yes [provider]  hydroxypropyl methylcellulose / hypromellose (ISOPTO TEARS / GONIOVISC) 2.5 % ophthalmic solution Place 1 drop into both eyes in the morning and at bedtime.   Yes [provider]  hydrOXYzine (ATARAX) 50 MG tablet Take 50 mg by mouth every 8 (eight) hours as needed for anxiety.   Yes [provider]  mirtazapine (REMERON) 15 MG tablet Take 15 mg by mouth at bedtime. 09/27/22  Yes [provider]  Multiple Vitamin (MULTIVITAMIN WITH MINERALS) TABS tablet Take 1 tablet by mouth daily. 12/31/20  Yes Debbe Odea, MD  thiamine (VITAMIN B-1) 100 MG tablet Take 100 mg by mouth daily.   Yes [provider]  acetaminophen (TYLENOL) 325 MG tablet Take 1-2 tablets (325-650 mg total) by mouth  every 4 (four) hours as needed for mild pain. Patient not taking: Reported on 11/29/2022 09/06/22   Vaughan Basta, [address], PA-C  ALPRAZolam Duanne Moron) 0.5 MG tablet Take 1 tablet (0.5 mg total) by mouth daily as needed for anxiety. Patient not taking: Reported on 11/29/2022 09/06/22   Barbie Banner, PA-C  melatonin 3 MG TABS tablet Take 1 tablet (3 mg total) by mouth at bedtime. Patient not taking: Reported on 11/29/2022 09/06/22   Barbie Banner, PA-C  mirtazapine (REMERON) 30 MG tablet Take 0.5 tablets (15 mg total) by mouth at bedtime. Patient not taking: Reported on 11/29/2022 08/13/18   Argentina Donovan, PA-C  polyethylene glycol (MIRALAX / GLYCOLAX) 17 g packet Take 17 g by mouth daily as needed. Patient not taking: Reported on 11/29/2022 09/06/22   Vaughan Basta, [address], PA-C  senna-docusate (SENOKOT-S) 8.6-50 MG tablet Take 1 tablet by mouth 2 (two) times daily. Patient not taking: Reported on 11/29/2022 09/06/22   Barbie Banner, PA-C  tiZANidine (ZANAFLEX) 2 MG tablet TAKE 1 TABLET(2 MG) BY MOUTH AT BEDTIME  Patient not taking: Reported on 11/29/2022 10/15/22   Charlett Blake, MD  traMADol (ULTRAM) 50 MG tablet Take 1 tablet (50 mg total) by mouth every 6 (six) hours as needed for moderate pain. Patient not taking: Reported on 11/29/2022 09/06/22   Barbie Banner, PA-C     Family History  Problem Relation Age of Onset   Heart failure Mother    Heart attack Mother    Heart disease Mother    Hypertension Mother    Hyperlipidemia Mother    CVA Father    Alzheimer's disease Father    Cancer Neg Hx    Colon cancer Neg Hx    Esophageal cancer Neg Hx    Rectal cancer Neg Hx    Stomach cancer Neg Hx     Social History   Socioeconomic History   Marital status: Divorced    Spouse name: Not on file   Number of children: 2   Years of education: Not on file   Highest education level: Not on file  Occupational History   Occupation: retired  Tobacco Use   Smoking status: Every Day     Packs/day: 0.50    Years: 40.00    Total pack years: 20.00    Types: Cigarettes    Start date: 11/12/1968   Smokeless tobacco: Never   Tobacco comments:    cutting back, wearing patches  Vaping Use   Vaping Use: Never used  Substance and Sexual Activity   Alcohol use: Not Currently    Comment: beer on weekends (6-pack)   Drug use: No   Sexual activity: Not Currently  Other Topics Concern   Not on file  Social History Narrative   Not on file   Social Determinants of Health   Financial Resource Strain: Not on file  Food Insecurity: Not on file  Transportation Needs: Not on file  Physical Activity: Not on file  Stress: Not on file  Social Connections: Not on file   Review of Systems  Constitutional:  Positive for activity change, appetite change and unexpected weight change.  Respiratory: Negative.    Cardiovascular: Negative.   Gastrointestinal: Negative.   Genitourinary:  Positive for decreased urine volume.  Musculoskeletal:        Reports left side of body hurts, but unable to articulate further  Neurological:  Positive for weakness.  Hematological: Negative.   Psychiatric/Behavioral: Negative.     Vital Signs: BP (!) 140/65   Pulse 87   Temp (!) 97.5 F (36.4 C) (Oral)   Resp 18   SpO2 100%   Physical Exam Vitals reviewed.  Constitutional:      General: She is awake. She is not in acute distress.    Comments: Chronically ill appearing and weak  HENT:     Mouth/Throat:     Mouth: Mucous membranes are dry.     Pharynx: Oropharynx is clear.  Eyes:     General: Scleral icterus present.     Extraocular Movements: Extraocular movements intact.  Cardiovascular:     Rate and Rhythm: Normal rate and regular rhythm.  Pulmonary:     Effort: Pulmonary effort is normal. No respiratory distress.     Breath sounds: Normal breath sounds.  Abdominal:     General: Abdomen is flat.     Palpations: Abdomen is soft.  Musculoskeletal:     Comments: Flaccid left side   Skin:    General: Skin is dry.  Neurological:     General: No focal  deficit present.     Mental Status: She is alert and oriented to person, place, and time.  Psychiatric:        Mood and Affect: Mood normal.        Behavior: Behavior normal. Behavior is cooperative.     Imaging: DG ESOPHAGUS W SINGLE CM (SOL OR THIN BA)  Result Date: 11/30/2022 CLINICAL DATA:  Decreased oral intake, history of CVA, odynophagia EXAM: ESOPHAGUS/BARIUM SWALLOW/TABLET STUDY TECHNIQUE: Significantly limited single contrast examination was performed using thin liquid barium. This exam was performed by Pasty Spillers, PA-C, and was supervised and interpreted by Salvatore Marvel, MD. FLUOROSCOPY: Radiation Exposure Index (as provided by the fluoroscopic device): 3.10 mGy Kerma COMPARISON:  Swallow function from 07/27/22. FINDINGS: Three small sips of barium were consumed, limiting assessment. There is delay in the oral phase of swallow. Suggestion of transient laryngeal penetration on the limited swallows. No gross tracheal aspiration. Normal caliber esophagus with no gross esophageal abnormality appreciated on very limited views. IMPRESSION: Grossly nondiagnostic evaluation with minimal barium consumed. Noted is a delay in the oral phase of swallowing. Suggestion of transient laryngeal penetration. No gross tracheal aspiration. Electronically Signed   By: Ilona Sorrel M.D.   On: 11/30/2022 09:53   CT ABDOMEN PELVIS WO CONTRAST  Result Date: 11/30/2022 CLINICAL DATA:  Abdominal pain EXAM: CT ABDOMEN AND PELVIS WITHOUT CONTRAST TECHNIQUE: Multidetector CT imaging of the abdomen and pelvis was performed following the standard protocol without IV contrast. RADIATION DOSE REDUCTION: This exam was performed according to the departmental dose-optimization program which includes automated exposure control, adjustment of the mA and/or kV according to patient size and/or use of iterative reconstruction technique. COMPARISON:   05/06/2013 FINDINGS: Lower chest: Dependent bibasilar subsegmental atelectasis or scarring. No pleural or pericardial effusion. Hepatobiliary: No focal abnormalities identified in the liver without contrast. The gallbladder is distended. Pancreas: Unremarkable. No pancreatic ductal dilatation or surrounding inflammatory changes. Spleen: Normal in size without focal abnormality. Adrenals/Urinary Tract: No adrenal lesions identified. Mm sized midpole right-sided renal stone with no hydronephrosis. No stones in the ureters. Unremarkable urinary bladder. Stomach/Bowel: Stomach is within normal limits. Appendix not visualized and there is no evidence of appendicitis. No evidence of bowel wall thickening, distention, or inflammatory changes. Vascular/Lymphatic: Aortic atherosclerosis. No enlarged abdominal or pelvic lymph nodes. Reproductive: Uterus and bilateral adnexa are unremarkable. Other: No abdominal wall hernia or abnormality. No abdominopelvic ascites. Musculoskeletal: No acute or significant osseous findings. IMPRESSION: 1. Distended gallbladder. 2. Nonobstructing punctate stone right kidney. 3. No acute abdominal or pelvic pathology identified. Electronically Signed   By: Sammie Bench M.D.   On: 11/30/2022 09:26   DG Chest Portable 1 View  Result Date: 11/29/2022 CLINICAL DATA:  dysphagia EXAM: PORTABLE CHEST 1 VIEW COMPARISON:  August 31, 2014 FINDINGS: The cardiomediastinal silhouette is unchanged in contour.Atherosclerotic calcifications. No pleural effusion. No pneumothorax. No acute pleuroparenchymal abnormality. IMPRESSION: No acute cardiopulmonary abnormality. Electronically Signed   By: Valentino Saxon M.D.   On: 11/29/2022 18:15    Labs:  CBC: Recent Labs    08/20/22 0616 08/27/22 0559 09/03/22 0518 11/29/22 2100  WBC 7.6 7.6 7.5 8.0  HGB 10.5* 10.2* 11.4* 11.7*  HCT 32.8* 31.8* 36.3 36.8  PLT 365 326 281 215    COAGS: Recent Labs    05/28/22 1212 07/26/22 0444  INR  1.0 1.0  APTT 27 27    BMP: Recent Labs    08/20/22 0616 08/27/22 0559 09/03/22 0518 11/29/22 1800  NA 145 142 141 142  K 4.1 3.8 4.2 4.3  CL 109 107 110 104  CO2 '27 26 25 22  '$ GLUCOSE 91 103* 91 79  BUN '13 13 12 11  '$ CALCIUM 9.7 10.0 9.6 9.4  CREATININE 0.96 0.97 1.00 0.97  GFRNONAA >60 >60 >60 >60    LIVER FUNCTION TESTS: Recent Labs    07/26/22 0444 07/27/22 1130 08/02/22 0715 11/29/22 1800  BILITOT 0.5 0.7 0.4 1.0  AST '18 16 21 22  '$ ALT '13 11 21 11  '$ ALKPHOS 84 84 88 52  PROT 7.1 6.9 7.4 6.6  ALBUMIN 3.8 3.5 3.8 3.5    Assessment and Plan:  Odynophagia --has led to malnutrition --IR consulted for G-tube.  Anatomy has marginal window on CT for percutaneous placement and has been approved by Dr. Serafina Royals. --Labs, history, and vitals reviewed --Dr. Erlinda Hong plans to utilize NG tube over the weekend.  This will also be used for contrast administration Sunday evening. --Will setup for tentative placement for Monday 12/03/22.  Contrast to be given at 2000 Sunday.  Feeds to be d/c'd at midnight.  Risks and benefits image guided gastrostomy tube placement was discussed with the patient including, but not limited to the need for a barium enema during the procedure, bleeding, infection, peritonitis and/or damage to adjacent structures.  All of the patient's questions were answered, patient is agreeable to proceed.  Consent signed and in chart.   Thank you for this interesting consult.  I greatly enjoyed meeting AIYANNA AWTREY and look forward to participating in their care.  A copy of this report was sent to the requesting provider on this date.  Electronically Signed: Pasty Spillers, PA 11/30/2022, 3:31 PM   I spent a total of 40 Minutes in face to face in clinical consultation, greater than 50% of which was counseling/coordinating care for odynophagia

## 2022-11-30 NOTE — Consult Note (Signed)
Consultation  Referring Provider:     [facility] Primary Care Physician:  Andree Moro, DO Primary Gastroenterologist:      Loletha Carrow Reason for Consultation:     Dysphagia and reported odynophagia     Impression / Plan:    Dysphagia which I think is oral/oropharyngeal and related to recent stroke.  I am not getting a good story for odynophagia and no clear risk factors for infectious or pill esophagitis.  Weight loss and failure to thrive related to the above  She does have a history of a distal esophageal stricture dilated to 15.5 mm by balloon and then disrupted with forceps last done in 2021 with prior dilation by [facility] dilator in 2017.  Her symptoms are not consistent with that issue at this time.  Though limited views on barium swallow today she has a normal caliber esophagus with suggestion of transient laryngeal penetration.  She did have oral greater than pharyngeal dysphagia on 07/27/2022 bedside swallow aspiration.  -------------------------------------------------------------------------------------------------------------------  She may need an upper endoscopy but I think it would be best to start with a speech path assessment.  Family reports she had outpatient speech path for a month after last stroke but she continued to decline and have deteriorating swallowing function and is not really taking any of her medications.  Also not any nourishment or significant liquids.  Something does not quite make sense she has had persistent deterioration rather than improvement which we would expect after a stroke and time.  Once this assessment is done I will reconsider upper endoscopy but have not scheduled that yet.   Gatha Mayer, MD, [facility] See Shea Evans on call - gastroenterology for best contact person 11/30/2022 5:56 PM           HPI:   Kara Bailey is a 67 y.o. female admitted to the hospital yesterday because of severe dysphagia problems and  failure to thrive.  She suffered an ischemic stroke in September 2023 with left hemiplegia, and was discharged to the inpatient rehab on a dysphagia 1 diet.  Other problems include dysarthria.  She was in the [facility] inpatient rehab facility from 08/01/2022 to 09/06/2022 and was discharged on a dysphagia 2 diet.  She is unable to walk due to her left hemiplegia.  History is taken from the daughter and the patient the patient has limited ability to communicate with the dysarthria.  She does follow commands.  She does not lie in bed all day she does sit up some.  No mouth sores or tongue sores.  No significant heartburn issues.  She is really not taken any of her medications in some time and is off her Plavix for about a month.  They took her to [facility] yesterday and she was referred for admission.  Apparently an outpatient speech pathologist had returned and recommended she get a gastrostomy tube.  IR evaluated her and she went for a barium swallow today and due to limited ability to swallow I was consulted for further evaluation question needs EGD due to odynophagia.  I do not get a sense for painful swallowing she just really is not able to initiate or transfer pills or liquids etc. into her esophagus it seems like.  Again history is challenging to obtain.    EGD history outlined in the assessment and plan prior colonoscopy 2017 with a 9 mm adenoma removed Past Medical History:  Diagnosis Date   Acute CVA (cerebrovascular accident) ([place]) 12/29/2020  Anxiety    Chest pain 08/2014   unspecified   Cirrhosis ([place])    CVA (cerebral vascular accident) ([place]) 07/27/2022   Depression    Dizziness 08/2014   Dyspnea 08/2014   Dysrhythmia    Emphysema lung (HCC)    GERD (gastroesophageal reflux disease)    Heart murmur    Hepatitis C    Hiatal hernia    Hypertension    Ischemic stroke ([place]) 08/01/2022   PAC (premature atrial contraction) 10/11/2014   PAD (peripheral artery disease) (HCC)     Palpitations    PVC (premature ventricular contraction) 10/11/2014   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   Stroke ([place])    just slurred speech   Tachycardia 08/2014   Ventral hernia    Weakness 08/2014    Past Surgical History:  Procedure Laterality Date   ABDOMINAL AORTOGRAM W/LOWER EXTREMITY N/A 07/13/2021   Procedure: ABDOMINAL AORTOGRAM W/LOWER EXTREMITY;  Surgeon: Lorretta Harp, MD;  Location: [place] [facility];  Service: Cardiovascular;  Laterality: N/A;   ABDOMINAL AORTOGRAM W/LOWER EXTREMITY N/A 03/19/2022   Procedure: ABDOMINAL AORTOGRAM W/LOWER EXTREMITY;  Surgeon: Lorretta Harp, MD;  Location: [place] [facility];  Service: Cardiovascular;  Laterality: N/A;   APPENDECTOMY     CATARACT EXTRACTION, BILATERAL     COLONOSCOPY     COLONOSCOPY WITH PROPOFOL N/A 04/30/2016   Procedure: COLONOSCOPY WITH PROPOFOL;  Surgeon: Daneil Dolin, MD;  [facility];  Service: Endoscopy;  Laterality: N/A;  [place] N/A 10/13/2021   Procedure: OPEN REPAIR EPIGASTRIC HERNIA WITH MESH PATCH;  Surgeon: Armandina Gemma, MD;  Location: [facility];  Service: General;  Laterality: N/A;   ESOPHAGOGASTRODUODENOSCOPY     approximately 2010   ESOPHAGOGASTRODUODENOSCOPY (EGD) WITH PROPOFOL N/A 04/30/2016   Procedure: ESOPHAGOGASTRODUODENOSCOPY (EGD) WITH PROPOFOL;  Surgeon: Daneil Dolin, MD;  [facility];  Service: Endoscopy;  Laterality: N/A;   LEFT HEART CATHETERIZATION WITH CORONARY ANGIOGRAM N/A 10/29/2014   07-14-20- pt denies this Procedure: LEFT HEART CATHETERIZATION WITH CORONARY ANGIOGRAM;  Surgeon: Burnell Blanks, MD;  Location: [facility] CATH LAB;  Service: Cardiovascular;  Laterality: N/A;   MALONEY DILATION N/A 04/30/2016   Procedure: Venia Minks DILATION;  Surgeon: Daneil Dolin, MD;  [facility];  Service: Endoscopy;  Laterality: N/A;   PERIPHERAL VASCULAR INTERVENTION  07/13/2021   Procedure: PERIPHERAL VASCULAR INTERVENTION;   Surgeon: Lorretta Harp, MD;  Location: [place] [facility];  Service: Cardiovascular;;  left SFA left external iliac   PERIPHERAL VASCULAR INTERVENTION Right 03/19/2022   Procedure: PERIPHERAL VASCULAR INTERVENTION;  Surgeon: Lorretta Harp, MD;  Location: [place] [facility];  Service: Cardiovascular;  Laterality: Right;  SFA   POLYPECTOMY  04/30/2016   Procedure: POLYPECTOMY;  Surgeon: Daneil Dolin, MD;  [facility];  Service: Endoscopy;;  Sigmoid colon polyp removed via hot snare   UPPER GASTROINTESTINAL ENDOSCOPY      Family History  Problem Relation Age of Onset   Heart failure Mother    Heart attack Mother    Heart disease Mother    Hypertension Mother    Hyperlipidemia Mother    CVA Father    Alzheimer's disease Father    Cancer Neg Hx    Colon cancer Neg Hx    Esophageal cancer Neg Hx    Rectal cancer Neg Hx    Stomach cancer Neg Hx    Social History   Tobacco Use  Smoking status: Every Day    Packs/day: 0.50    Years: 40.00    Total pack years: 20.00    Types: Cigarettes    Start date: 11/12/1968   Smokeless tobacco: Never   Tobacco comments:    cutting back, wearing patches  Vaping Use   Vaping Use: Never used  Substance Use Topics   Alcohol use: Not Currently    Comment: beer on weekends (6-pack)   Drug use: No    Prior to Admission medications   Medication Sig Start Date End Date Taking? Authorizing Provider  albuterol (VENTOLIN HFA) 108 (90 Base) MCG/ACT inhaler Inhale 2 puffs into the lungs every 6 (six) hours as needed for wheezing or shortness of breath.   Yes [provider]  aspirin EC 81 MG EC tablet Take 1 tablet (81 mg total) by mouth daily. Swallow whole. 12/31/20  Yes Debbe Odea, MD  atorvastatin (LIPITOR) 80 MG tablet Take 1 tablet (80 mg total) by mouth daily. 09/06/22  Yes Setzer, [address], PA-C  Baclofen 5 MG TABS Take 1 tablet by mouth every 8 (eight) hours as needed (pain). 11/24/22  Yes [provider]   citalopram (CELEXA) 10 MG tablet Take 10 mg by mouth every morning. 10/31/22  Yes [provider]  clopidogrel (PLAVIX) 75 MG tablet TAKE 1 TABLET EVERY DAY Patient taking differently: Take 75 mg by mouth daily. 07/23/22  Yes Lorretta Harp, MD  diltiazem (CARDIZEM CD) 240 MG 24 hr capsule Take 1 capsule (240 mg total) by mouth daily. 09/07/22  Yes Setzer, [address], PA-C  ezetimibe (ZETIA) 10 MG tablet Take 10 mg by mouth daily.   Yes [provider]  famotidine (PEPCID) 40 MG tablet Take 40 mg by mouth at bedtime. 10/25/20  Yes [provider]  hydroxypropyl methylcellulose / hypromellose (ISOPTO TEARS / GONIOVISC) 2.5 % ophthalmic solution Place 1 drop into both eyes in the morning and at bedtime.   Yes [provider]  hydrOXYzine (ATARAX) 50 MG tablet Take 50 mg by mouth every 8 (eight) hours as needed for anxiety.   Yes [provider]  mirtazapine (REMERON) 15 MG tablet Take 15 mg by mouth at bedtime. 09/27/22  Yes [provider]  Multiple Vitamin (MULTIVITAMIN WITH MINERALS) TABS tablet Take 1 tablet by mouth daily. 12/31/20  Yes Debbe Odea, MD  thiamine (VITAMIN B-1) 100 MG tablet Take 100 mg by mouth daily.   Yes [provider]  acetaminophen (TYLENOL) 325 MG tablet Take 1-2 tablets (325-650 mg total) by mouth every 4 (four) hours as needed for mild pain. Patient not taking: Reported on 11/29/2022 09/06/22   Vaughan Basta, [address], PA-C  ALPRAZolam Duanne Moron) 0.5 MG tablet Take 1 tablet (0.5 mg total) by mouth daily as needed for anxiety. Patient not taking: Reported on 11/29/2022 09/06/22   Barbie Banner, PA-C  melatonin 3 MG TABS tablet Take 1 tablet (3 mg total) by mouth at bedtime. Patient not taking: Reported on 11/29/2022 09/06/22   Barbie Banner, PA-C  mirtazapine (REMERON) 30 MG tablet Take 0.5 tablets (15 mg total) by mouth at bedtime. Patient not taking: Reported on 11/29/2022 08/13/18   Argentina Donovan, PA-C   polyethylene glycol (MIRALAX / GLYCOLAX) 17 g packet Take 17 g by mouth daily as needed. Patient not taking: Reported on 11/29/2022 09/06/22   Vaughan Basta, [address], PA-C  senna-docusate (SENOKOT-S) 8.6-50 MG tablet Take 1 tablet by mouth 2 (two) times daily. Patient not taking: Reported on  11/29/2022 09/06/22   Setzer, [address], PA-C  tiZANidine (ZANAFLEX) 2 MG tablet TAKE 1 TABLET(2 MG) BY MOUTH AT BEDTIME Patient not taking: Reported on 11/29/2022 10/15/22   Charlett Blake, MD  traMADol (ULTRAM) 50 MG tablet Take 1 tablet (50 mg total) by mouth every 6 (six) hours as needed for moderate pain. Patient not taking: Reported on 11/29/2022 09/06/22   Barbie Banner, PA-C    Current Facility-Administered Medications  Medication Dose Route Frequency Provider Last Rate Last Admin   acetaminophen (TYLENOL) suppository 650 mg  650 mg Rectal Q4H PRN Kristopher Oppenheim, DO       [START ON 12/03/2022] ceFAZolin (ANCEF) IVPB 2g/100 mL premix  2 g Intravenous to XRAY Boisseau, Hayley, PA       dextrose 5 % in lactated ringers infusion   Intravenous Continuous Florencia Reasons, MD       diclofenac Sodium (VOLTAREN) 1 % topical gel 4 g  4 g Topical QID Kristopher Oppenheim, DO   4 g at 11/30/22 1445   [START ON 12/02/2022] iohexol (OMNIPAQUE) 300 MG/ML solution 100 mL  100 mL Per Tube Once PRN Boisseau, Hayley, PA       lactated ringers infusion   Intravenous Continuous Kristopher Oppenheim, DO   Stopped at 11/30/22 1307   morphine (PF) 2 MG/ML injection 2 mg  2 mg Intravenous Q4H PRN Florencia Reasons, MD       ondansetron [facility]) tablet 4 mg  4 mg Oral Q6H PRN Kristopher Oppenheim, DO       Or   ondansetron [facility]) injection 4 mg  4 mg Intravenous Q6H PRN Kristopher Oppenheim, DO        Allergies as of 11/29/2022   (No Known Allergies)     Review of Systems:    This is positive for those things mentioned in the HPI, ability to obtain further is limited by dysarthria. All other review of systems are negative.       Physical Exam:  Vital signs in last 24  hours: Temp:  [97.5 F (36.4 C)-98.1 F (36.7 C)] 98.1 F (36.7 C) (01/19 1602) Pulse Rate:  [75-104] 75 (01/19 1602) Resp:  [8-20] 16 (01/19 1602) BP: (100-158)/(61-133) 127/73 (01/19 1602) SpO2:  [95 %-100 %] 99 % (01/19 1602) Weight:  [51.2 kg] 51.2 kg (01/19 1602) Last BM Date : 11/29/22  General:  Elderly thin and chronically ill black woman no acute distress she is able to speak some and answer some questions but speech is not very intelligible, eyes:  anicteric. ENT:   Mouth and posterior pharynx free of lesions.  No thrush no ulcers.  Lungs: Clear to auscultation bilaterally. Heart:   S1S2, no rubs, murmurs, gallops. Abdomen: Very thin soft, non-tender, no hepatosplenomegaly, hernia, or mass and BS+.  Extremities:   no edema  Neuro:  Difficult to assess for alert and oriented, she does follow commands there is a left hemiplegia, significant dysarthria Psych:  appropriate mood and  Affect.   Data Reviewed:   LAB RESULTS: Recent Labs    11/29/22 2100  WBC 8.0  HGB 11.7*  HCT 36.8  PLT 215   BMET Recent Labs    11/29/22 1800  NA 142  K 4.3  CL 104  CO2 22  GLUCOSE 79  BUN 11  CREATININE 0.97  CALCIUM 9.4   LFT Recent Labs    11/29/22 1800  PROT 6.6  ALBUMIN 3.5  AST 22  ALT 11  ALKPHOS 52  BILITOT 1.0  STUDIES: Images personally viewed DG ESOPHAGUS W SINGLE CM (SOL OR THIN BA)  Result Date: 11/30/2022 CLINICAL DATA:  Decreased oral intake, history of CVA, odynophagia EXAM: ESOPHAGUS/BARIUM SWALLOW/TABLET STUDY TECHNIQUE: Significantly limited single contrast examination was performed using thin liquid barium. This exam was performed by Pasty Spillers, PA-C, and was supervised and interpreted by Salvatore Marvel, MD. FLUOROSCOPY: Radiation Exposure Index (as provided by the fluoroscopic device): 3.10 mGy Kerma COMPARISON:  Swallow function from 07/27/22. FINDINGS: Three small sips of barium were consumed, limiting assessment. There is delay in the oral  phase of swallow. Suggestion of transient laryngeal penetration on the limited swallows. No gross tracheal aspiration. Normal caliber esophagus with no gross esophageal abnormality appreciated on very limited views. IMPRESSION: Grossly nondiagnostic evaluation with minimal barium consumed. Noted is a delay in the oral phase of swallowing. Suggestion of transient laryngeal penetration. No gross tracheal aspiration. Electronically Signed   By: Ilona Sorrel M.D.   On: 11/30/2022 09:53   CT ABDOMEN PELVIS WO CONTRAST  Result Date: 11/30/2022 CLINICAL DATA:  Abdominal pain EXAM: CT ABDOMEN AND PELVIS WITHOUT CONTRAST TECHNIQUE: Multidetector CT imaging of the abdomen and pelvis was performed following the standard protocol without IV contrast. RADIATION DOSE REDUCTION: This exam was performed according to the departmental dose-optimization program which includes automated exposure control, adjustment of the mA and/or kV according to patient size and/or use of iterative reconstruction technique. COMPARISON:  05/06/2013 FINDINGS: Lower chest: Dependent bibasilar subsegmental atelectasis or scarring. No pleural or pericardial effusion. Hepatobiliary: No focal abnormalities identified in the liver without contrast. The gallbladder is distended. Pancreas: Unremarkable. No pancreatic ductal dilatation or surrounding inflammatory changes. Spleen: Normal in size without focal abnormality. Adrenals/Urinary Tract: No adrenal lesions identified. Mm sized midpole right-sided renal stone with no hydronephrosis. No stones in the ureters. Unremarkable urinary bladder. Stomach/Bowel: Stomach is within normal limits. Appendix not visualized and there is no evidence of appendicitis. No evidence of bowel wall thickening, distention, or inflammatory changes. Vascular/Lymphatic: Aortic atherosclerosis. No enlarged abdominal or pelvic lymph nodes. Reproductive: Uterus and bilateral adnexa are unremarkable. Other: No abdominal wall hernia  or abnormality. No abdominopelvic ascites. Musculoskeletal: No acute or significant osseous findings. IMPRESSION: 1. Distended gallbladder. 2. Nonobstructing punctate stone right kidney. 3. No acute abdominal or pelvic pathology identified. Electronically Signed   By: Sammie Bench M.D.   On: 11/30/2022 09:26   DG Chest Portable 1 View  Result Date: 11/29/2022 CLINICAL DATA:  dysphagia EXAM: PORTABLE CHEST 1 VIEW COMPARISON:  August 31, 2014 FINDINGS: The cardiomediastinal silhouette is unchanged in contour.Atherosclerotic calcifications. No pleural effusion. No pneumothorax. No acute pleuroparenchymal abnormality. IMPRESSION: No acute cardiopulmonary abnormality. Electronically Signed   By: Valentino Saxon M.D.   On: 11/29/2022 18:15       Thanks   LOS: 0 days   '@Glendell Schlottman'$  Simonne Maffucci, MD, [facility] @  11/30/2022, 5:50 PM

## 2022-11-30 NOTE — Progress Notes (Addendum)
Nutrition Brief Note  RD consulted for tube feeding initiation and management. Pt was in ED all day and RD was unable to see or assess.  Height and weight have been obtained. Pt awaiting PEG placement, planned for Monday 1/22.  Per IR note, pt to have small bore NGT placed until PEG can be placed.  Will leave recommendations below once tube is place and placement verified by x-ray.  Please re-consult RD once tube is placed and verified.  Estimated Nutrition Needs: Kcal: 1750-1950 kcals Protein: 85-95g Fluid:1.9L/day  TF recommendations: -Initiate Osmolite 1.5 @ 20 ml/hr, advance by 10 ml every 12 hours to goal rate of 55 ml/hr. -Pt will be at refeeding syndrome risk given poor PO for 1 month per chart review. Monitor magnesium, potassium, and phosphorus for at least 3 days, MD to replete as needed, as pt is at risk for refeeding syndrome. -Multivitamin with minerals daily via tube  Wt Readings from Last 15 Encounters:  11/30/22 51.2 kg  09/05/22 57 kg  07/28/22 65.6 kg  03/19/22 64.4 kg  03/07/22 63.5 kg  10/16/21 72.6 kg  10/13/21 69.5 kg  09/26/21 69.5 kg  08/09/21 68.8 kg  07/13/21 73.9 kg  07/05/21 70.2 kg  04/19/21 68.9 kg  04/11/21 68.5 kg  02/08/21 67.1 kg  01/26/21 66.2 kg    Body mass index is 18.22 kg/m. Patient meets criteria for underweight based on current BMI.   Labs and medications reviewed.   If other nutrition issues arise, please consult RD.   RD to complete full assessment 1/22.  Clayton Bibles, MS, RD, LDN Inpatient Clinical Dietitian Contact information available via Amion

## 2022-12-01 ENCOUNTER — Observation Stay (HOSPITAL_COMMUNITY): Payer: Medicare HMO

## 2022-12-01 DIAGNOSIS — F1090 Alcohol use, unspecified, uncomplicated: Secondary | ICD-10-CM | POA: Diagnosis present

## 2022-12-01 DIAGNOSIS — I69322 Dysarthria following cerebral infarction: Secondary | ICD-10-CM | POA: Diagnosis not present

## 2022-12-01 DIAGNOSIS — E43 Unspecified severe protein-calorie malnutrition: Secondary | ICD-10-CM | POA: Diagnosis present

## 2022-12-01 DIAGNOSIS — R64 Cachexia: Secondary | ICD-10-CM | POA: Diagnosis present

## 2022-12-01 DIAGNOSIS — K921 Melena: Secondary | ICD-10-CM | POA: Diagnosis not present

## 2022-12-01 DIAGNOSIS — K521 Toxic gastroenteritis and colitis: Secondary | ICD-10-CM | POA: Diagnosis not present

## 2022-12-01 DIAGNOSIS — R710 Precipitous drop in hematocrit: Secondary | ICD-10-CM | POA: Diagnosis not present

## 2022-12-01 DIAGNOSIS — F32A Depression, unspecified: Secondary | ICD-10-CM | POA: Diagnosis present

## 2022-12-01 DIAGNOSIS — I69391 Dysphagia following cerebral infarction: Secondary | ICD-10-CM | POA: Diagnosis not present

## 2022-12-01 DIAGNOSIS — K649 Unspecified hemorrhoids: Secondary | ICD-10-CM | POA: Diagnosis not present

## 2022-12-01 DIAGNOSIS — F17210 Nicotine dependence, cigarettes, uncomplicated: Secondary | ICD-10-CM | POA: Diagnosis not present

## 2022-12-01 DIAGNOSIS — L89152 Pressure ulcer of sacral region, stage 2: Secondary | ICD-10-CM | POA: Diagnosis not present

## 2022-12-01 DIAGNOSIS — R195 Other fecal abnormalities: Secondary | ICD-10-CM | POA: Diagnosis not present

## 2022-12-01 DIAGNOSIS — B0223 Postherpetic polyneuropathy: Secondary | ICD-10-CM | POA: Diagnosis present

## 2022-12-01 DIAGNOSIS — I739 Peripheral vascular disease, unspecified: Secondary | ICD-10-CM | POA: Diagnosis present

## 2022-12-01 DIAGNOSIS — I69321 Dysphasia following cerebral infarction: Secondary | ICD-10-CM | POA: Diagnosis present

## 2022-12-01 DIAGNOSIS — R252 Cramp and spasm: Secondary | ICD-10-CM | POA: Diagnosis present

## 2022-12-01 DIAGNOSIS — I10 Essential (primary) hypertension: Secondary | ICD-10-CM | POA: Diagnosis present

## 2022-12-01 DIAGNOSIS — D649 Anemia, unspecified: Secondary | ICD-10-CM | POA: Diagnosis present

## 2022-12-01 DIAGNOSIS — R1311 Dysphagia, oral phase: Secondary | ICD-10-CM | POA: Diagnosis not present

## 2022-12-01 DIAGNOSIS — Z681 Body mass index (BMI) 19 or less, adult: Secondary | ICD-10-CM | POA: Diagnosis not present

## 2022-12-01 DIAGNOSIS — I69354 Hemiplegia and hemiparesis following cerebral infarction affecting left non-dominant side: Secondary | ICD-10-CM | POA: Diagnosis not present

## 2022-12-01 DIAGNOSIS — K810 Acute cholecystitis: Secondary | ICD-10-CM | POA: Diagnosis not present

## 2022-12-01 DIAGNOSIS — J439 Emphysema, unspecified: Secondary | ICD-10-CM | POA: Diagnosis present

## 2022-12-01 DIAGNOSIS — K7460 Unspecified cirrhosis of liver: Secondary | ICD-10-CM | POA: Diagnosis present

## 2022-12-01 DIAGNOSIS — K222 Esophageal obstruction: Secondary | ICD-10-CM | POA: Diagnosis present

## 2022-12-01 DIAGNOSIS — G8929 Other chronic pain: Secondary | ICD-10-CM | POA: Diagnosis present

## 2022-12-01 DIAGNOSIS — Z931 Gastrostomy status: Secondary | ICD-10-CM | POA: Diagnosis not present

## 2022-12-01 DIAGNOSIS — R1314 Dysphagia, pharyngoesophageal phase: Secondary | ICD-10-CM | POA: Diagnosis present

## 2022-12-01 DIAGNOSIS — J449 Chronic obstructive pulmonary disease, unspecified: Secondary | ICD-10-CM | POA: Diagnosis not present

## 2022-12-01 DIAGNOSIS — R627 Adult failure to thrive: Secondary | ICD-10-CM | POA: Diagnosis present

## 2022-12-01 LAB — BASIC METABOLIC PANEL
Anion gap: 9 (ref 5–15)
BUN: 5 mg/dL — ABNORMAL LOW (ref 8–23)
CO2: 25 mmol/L (ref 22–32)
Calcium: 8.9 mg/dL (ref 8.9–10.3)
Chloride: 108 mmol/L (ref 98–111)
Creatinine, Ser: 0.53 mg/dL (ref 0.44–1.00)
GFR, Estimated: >60 mL/min (ref >60)
Glucose, Bld: 117 mg/dL — ABNORMAL HIGH (ref 70–99)
Potassium: 3 mmol/L — ABNORMAL LOW (ref 3.5–5.1)
Sodium: 142 mmol/L (ref 135–145)

## 2022-12-01 LAB — PHOSPHORUS
Phosphorus: 3.1 mg/dL (ref 2.5–4.6)
Phosphorus: 2.3 mg/dL — ABNORMAL LOW (ref 2.5–4.6)

## 2022-12-01 LAB — HIV ANTIBODY (ROUTINE TESTING W REFLEX): HIV Screen 4th Generation wRfx: NONREACTIVE

## 2022-12-01 LAB — MAGNESIUM
Magnesium: 1.6 mg/dL — ABNORMAL LOW (ref 1.7–2.4)
Magnesium: 2.1 mg/dL (ref 1.7–2.4)

## 2022-12-01 MED ORDER — EZETIMIBE 10 MG PO TABS
10.0000 mg | ORAL_TABLET | Freq: Every day | ORAL | Status: DC
  Administered 2022-12-02 – 2023-01-14 (×42): 10 mg
  Filled 2022-12-01 (×42): qty 1

## 2022-12-01 MED ORDER — K PHOS MONO-SOD PHOS DI & MONO 155-852-130 MG PO TABS
500.0000 mg | ORAL_TABLET | Freq: Every day | ORAL | Status: AC
  Administered 2022-12-01 – 2022-12-02 (×2): 500 mg
  Filled 2022-12-01 (×2): qty 2

## 2022-12-01 MED ORDER — SODIUM CHLORIDE 0.9 % IV SOLN
6.2500 mg | Freq: Four times a day (QID) | INTRAVENOUS | Status: DC | PRN
  Administered 2022-12-01: 6.25 mg via INTRAVENOUS
  Filled 2022-12-01 (×5): qty 0.25

## 2022-12-01 MED ORDER — POLYETHYLENE GLYCOL 3350 17 G PO PACK
17.0000 g | PACK | Freq: Every day | ORAL | Status: AC
  Administered 2022-12-02: 17 g
  Filled 2022-12-01: qty 1

## 2022-12-01 MED ORDER — POTASSIUM CHLORIDE 20 MEQ PO PACK
40.0000 meq | PACK | ORAL | Status: AC
  Administered 2022-12-01 (×2): 40 meq
  Filled 2022-12-01 (×2): qty 2

## 2022-12-01 MED ORDER — LACTATED RINGERS IV BOLUS
1000.0000 mL | Freq: Once | INTRAVENOUS | Status: AC
  Administered 2022-12-01: 1000 mL via INTRAVENOUS

## 2022-12-01 MED ORDER — PANTOPRAZOLE SODIUM 40 MG IV SOLR
40.0000 mg | Freq: Two times a day (BID) | INTRAVENOUS | Status: DC
  Administered 2022-12-01 – 2022-12-16 (×26): 40 mg via INTRAVENOUS
  Filled 2022-12-01 (×27): qty 10

## 2022-12-01 MED ORDER — TIZANIDINE HCL 4 MG PO TABS
2.0000 mg | ORAL_TABLET | Freq: Two times a day (BID) | ORAL | Status: DC
  Administered 2022-12-01 – 2023-01-14 (×83): 2 mg
  Filled 2022-12-01 (×87): qty 1

## 2022-12-01 MED ORDER — BACLOFEN 10 MG PO TABS
5.0000 mg | ORAL_TABLET | Freq: Three times a day (TID) | ORAL | Status: DC | PRN
  Administered 2022-12-05 – 2023-01-10 (×26): 5 mg via NASOGASTRIC
  Filled 2022-12-01 (×27): qty 1

## 2022-12-01 MED ORDER — POTASSIUM CHLORIDE 10 MEQ/100ML IV SOLN
10.0000 meq | INTRAVENOUS | Status: DC
  Administered 2022-12-01 (×2): 10 meq via INTRAVENOUS
  Filled 2022-12-01 (×2): qty 100

## 2022-12-01 MED ORDER — POTASSIUM CHLORIDE 20 MEQ PO PACK
40.0000 meq | PACK | Freq: Two times a day (BID) | ORAL | Status: DC
  Filled 2022-12-01: qty 2

## 2022-12-01 MED ORDER — ATORVASTATIN CALCIUM 40 MG PO TABS
80.0000 mg | ORAL_TABLET | Freq: Every day | ORAL | Status: DC
  Administered 2022-12-01 – 2022-12-07 (×6): 80 mg
  Filled 2022-12-01 (×6): qty 2

## 2022-12-01 MED ORDER — MAGNESIUM SULFATE 2 GM/50ML IV SOLN
2.0000 g | Freq: Once | INTRAVENOUS | Status: AC
  Administered 2022-12-01: 2 g via INTRAVENOUS
  Filled 2022-12-01: qty 50

## 2022-12-01 MED ORDER — MELATONIN 3 MG PO TABS
3.0000 mg | ORAL_TABLET | Freq: Every day | ORAL | Status: DC
  Administered 2022-12-01 – 2023-01-13 (×42): 3 mg
  Filled 2022-12-01 (×43): qty 1

## 2022-12-01 MED ORDER — THIAMINE HCL 100 MG PO TABS
100.0000 mg | ORAL_TABLET | Freq: Every day | ORAL | Status: DC
  Administered 2022-12-02 – 2023-01-14 (×42): 100 mg
  Filled 2022-12-01 (×42): qty 1

## 2022-12-01 NOTE — Progress Notes (Addendum)
PROGRESS NOTE    Kara Bailey  LZJ:[id number] DOB: 01-08-1956 DOA: 11/29/2022 PCP: Andree Moro, DO     Brief Narrative:  H/o esophageal stenosis required dilation in 2017 and 2021, h/o HTN, HLD,    Subjective:   Has ng placed for tube feeds, reports feeling nauseous and having stomach pain this am,  tube feeds was advanced to 20cc/hr this am plan to hold tube feeds for a few hours, if symptom improves, will start tube feeds at a lower rate  Assessment & Plan:  Principal Problem:   Dysphagia as late effect of cerebrovascular accident (CVA) Active Problems:   Essential hypertension   Left hemiplegia ([place])   History of stroke    Assessment and Plan:  * Dysphagia likely multifactorial   late effect of cerebrovascular accident (CVA)  Also has h/o esophageal dysphagia due to esophageal stenosis - Pt has not been able to tolerate liquids, pureed diet or any other form of nutrition. Unable to swallow any of her pills for at least 1 month.   -Pt has not been on plavix for about 1 month.  -DG esophagus ",Grossly nondiagnostic evaluation with minimal barium consumed. Noted is a delay in the oral phase of swallowing. Suggestion of transient laryngeal penetration. No gross tracheal aspiration."  GI consulted -Family desire G tube placement, IR consulted who requested Ct ab/pel without contrast to eval anatomy then proceed with g tube placement on Monday to Tuesday pending availability  -.continue LR infusion ,  temporary feeding tube placed on 1/19 for nutrition ,will need to watch refeeding syndrome as she has not eat much for a month -repeat mri brain to rule out new CVA, speech swallow eval recommended by GI, ordered    Pm Addendum: She does not appear to be able to tolerate tube feeds, RN notified me patient feeling nauseous shortly after started tube feeding, nausea resolved after holding tube feeds Will just give free water flushes and meds per tube today, check  KUB  Hypomagnesemia/hypokalemia/hypophosphatemia Replace  History of stroke/Left hemiplegia  Has not been able to tolerate meds by mouth for the last month Chronic left side pain , reports from prior stroke, reports iv morphine helped  Essential hypertension/HLD Has not had BP meds for over 1 month due to dysphagia. Bp stable currently   Bacteriuria -UA is cloudy with moderate leukocyte many bacteria -Will collect urine culture -Currently no fever no leukocytosis, monitor off antibiotics  H/o cirrhosis, H/o hepc per chart review, unknown details CT this admission did not show sign of liver cirrhosis  Reviewed abdominal ultrasound from 2014 showed  Slightly nodular contours of the liver may indicate mild  cirrhosis.   H/p Alcohol use Six pack beer on the weekends per record   Current everyday smoker Trying to cut back wearing patches   Body mass index is 18.22 kg/m. Weight 57kg two month ago, today on 1/19 she weigh 51.2 kg         .   I have Reviewed nursing notes, Vitals, pain scores, I/o's, Lab results and  imaging results since pt's last encounter, details please see discussion above  I ordered the following labs:  Unresulted Labs (From admission, onward)     Start     Ordered   12/01/22 1338  Urine Culture  (Urine Culture)  Once,   R       Question:  Indication  Answer:  Flank Pain   12/01/22 1337   12/01/22 0240  Basic metabolic panel  Daily,  R      11/30/22 1842   12/01/22 0500  Phosphorus  5A & 5P,   R (with TIMED occurrences)      11/30/22 1842   12/01/22 0500  Magnesium  5A & 5P,   R (with TIMED occurrences)      11/30/22 1842   11/29/22 1745  CBC with Differential  (ED Shortness of Breath)  Once,   STAT        11/29/22 1745             DVT prophylaxis: SCDs Start: 11/30/22 0151   Code Status:   Code Status: Full Code  Family Communication: son over the phone Disposition:   Dispo: The patient is from: home              Anticipated  d/c is to: home              Anticipated d/c date is: some time next week after cleared by IR and able to tolerate tube feeds, son requested snf placement   Antimicrobials:    Anti-infectives (From admission, onward)    Start     Dose/Rate Route Frequency Ordered Stop   12/03/22 0700  ceFAZolin (ANCEF) IVPB 2g/100 mL premix        2 g 200 mL/hr over 30 Minutes Intravenous To Radiology 11/30/22 1602 12/04/22 0700          Objective: Vitals:   11/30/22 2138 12/01/22 0125 12/01/22 0546 12/01/22 0937  BP: 139/68 120/62 122/65 113/72  Pulse: 93 98 95 92  Resp: '18 16 18 17  '$ Temp: 98 F (36.7 C) 98.4 F (36.9 C) 97.6 F (36.4 C) 97.6 F (36.4 C)  TempSrc: Oral Oral Oral Oral  SpO2: 98% 98% 98% 100%  Weight:      Height:        Intake/Output Summary (Last 24 hours) at 12/01/2022 1342 Last data filed at 12/01/2022 1302 Gross per 24 hour  Intake 3226.26 ml  Output 600 ml  Net 2626.26 ml   Filed Weights   11/30/22 1602  Weight: 51.2 kg    Examination:  General exam: alert, awake, communicative,calm, NAD, + NG Respiratory system: Clear to auscultation. Respiratory effort normal. Cardiovascular system:  RRR.  Gastrointestinal system: Abdomen is nondistended, soft and nontender.  Normal bowel sounds heard. Central nervous system: Alert and oriented.  Dysarthria, left hemiplegia. Extremities:  no edema Skin: No rashes, lesions or ulcers Psychiatry: Judgement and insight appear normal. Mood & affect appropriate.     Data Reviewed: I have personally reviewed  labs and visualized  imaging studies since the last encounter and formulate the plan        Scheduled Meds:  diclofenac Sodium  4 g Topical QID   free water  100 mL Per Tube Q4H   Continuous Infusions:  [START ON 12/03/2022]  ceFAZolin (ANCEF) IV     dextrose 5% lactated ringers 75 mL/hr at 12/01/22 1302   feeding supplement (OSMOLITE 1.5 CAL) 20 mL/hr at 12/01/22 1302   potassium chloride 70 mL/hr at  12/01/22 1302   promethazine (PHENERGAN) injection (IM or IVPB) Stopped (12/01/22 1212)     LOS: 0 days   Time spent: 41mns  FFlorencia Reasons MD PhD FACP [facility]  Available via Epic secure chat 7am-7pm for nonurgent issues Please page for urgent issues To page the attending provider between 7A-7P or the covering provider during after hours 7P-7A, please log into the web site [email or web address] and access using universal  [place] password for that web site. If you do not have the password, please call the hospital operator.    12/01/2022, 1:42 PM

## 2022-12-01 NOTE — Progress Notes (Incomplete)
Pt c/o stomach pain. Provider notified. Pt denies gas pain or  spasms. Endorses minimal nausea. Zofran given to patient.

## 2022-12-01 NOTE — Progress Notes (Addendum)
Received call from xray that NGT is not in the stomach. Dr. Erlinda Hong notified.   Removed displaced NGT and replaced NGT to R nare, 65cm at the R nare.   Abdominal xray order in place to confirm NGT placement.  1745 NGT pulled back 2cm per Dr. Erlinda Hong. Now 63cm at R nare. Abdominal xray confirmed placement, okay to use NGT per Dr. Erlinda Hong.  1800 Restarted feeding at 10cc/hr per Dr. Erlinda Hong.

## 2022-12-01 NOTE — Evaluation (Addendum)
SLP Cancellation Note  Patient Details Name: Kara Bailey MRN: [id number] DOB: 1955/11/30   Cancelled treatment:       Reason Eval/Treat Not Completed: Other (comment);Patient at procedure or test/unavailable (pt off the floor at this time, presumed for MRI, will continue efforts)  Note pt has had issues with nausea - on Zofran and has feeding tube for medications - Tube feeding stopped this am per notes.    Kathleen Lime, MS [facility] SLP Acute Rehab Services Office [phone]   Macario Golds 12/01/2022, 3:19 PM

## 2022-12-01 NOTE — Plan of Care (Signed)

## 2022-12-01 NOTE — TOC Progression Note (Signed)
Transition of Care [facility]) - Progression Note    Patient Details  Name: Kara Bailey MRN: [id number] Date of Birth: 1956/03/25  Transition of Care [facility]) CM/SW Contact  Henrietta Dine, RN Phone Number: 12/01/2022, 11:34 AM  Clinical Narrative:    Attempted to complete TOC assessment; pt receiving bath; will attempt to complete at a later time.        Expected Discharge Plan and Services                                               Social Determinants of Health (SDOH) Interventions SDOH Screenings   Depression (PHQ2-9): High Risk (09/19/2022)  Tobacco Use: High Risk (11/29/2022)    Readmission Risk Interventions     No data to display

## 2022-12-02 DIAGNOSIS — I69391 Dysphagia following cerebral infarction: Secondary | ICD-10-CM | POA: Diagnosis not present

## 2022-12-02 LAB — BASIC METABOLIC PANEL
Anion gap: 7 (ref 5–15)
BUN: <5 mg/dL — ABNORMAL LOW (ref 8–23)
CO2: 24 mmol/L (ref 22–32)
Calcium: 8.1 mg/dL — ABNORMAL LOW (ref 8.9–10.3)
Chloride: 108 mmol/L (ref 98–111)
Creatinine, Ser: 0.69 mg/dL (ref 0.44–1.00)
GFR, Estimated: >60 mL/min (ref >60)
Glucose, Bld: 103 mg/dL — ABNORMAL HIGH (ref 70–99)
Potassium: 4.4 mmol/L (ref 3.5–5.1)
Sodium: 139 mmol/L (ref 135–145)

## 2022-12-02 LAB — MAGNESIUM
Magnesium: 1.9 mg/dL (ref 1.7–2.4)
Magnesium: 1.8 mg/dL (ref 1.7–2.4)

## 2022-12-02 LAB — GLUCOSE, CAPILLARY
Glucose-Capillary: 101 mg/dL — ABNORMAL HIGH (ref 70–99)
Glucose-Capillary: 84 mg/dL (ref 70–99)

## 2022-12-02 LAB — PHOSPHORUS
Phosphorus: 3 mg/dL (ref 2.5–4.6)
Phosphorus: 3.5 mg/dL (ref 2.5–4.6)

## 2022-12-02 MED ORDER — BACLOFEN 10 MG PO TABS: 5.0000 mg | ORAL_TABLET | Freq: Two times a day (BID) | ORAL | Status: DC

## 2022-12-02 MED ORDER — PHENOL 1.4 % MT LIQD
1.0000 | OROMUCOSAL | Status: DC | PRN
  Administered 2022-12-02: 1 via OROMUCOSAL
  Filled 2022-12-02: qty 177

## 2022-12-02 MED ORDER — BACLOFEN 10 MG PO TABS
5.0000 mg | ORAL_TABLET | Freq: Two times a day (BID) | ORAL | Status: AC
  Administered 2022-12-02: 5 mg
  Filled 2022-12-02: qty 1

## 2022-12-02 MED ORDER — SODIUM CHLORIDE 0.9% FLUSH
10.0000 mL | Freq: Two times a day (BID) | INTRAVENOUS | Status: DC
  Administered 2022-12-02 – 2022-12-03 (×4): 10 mL

## 2022-12-02 MED ORDER — DEXTROSE-NACL 5-0.9 % IV SOLN: INTRAVENOUS | Status: DC

## 2022-12-02 MED ORDER — SODIUM CHLORIDE 0.9% FLUSH: 10.0000 mL | INTRAVENOUS | Status: DC | PRN

## 2022-12-02 NOTE — Progress Notes (Signed)
   MRI w/o new strokes  Speech path eval is pending  Once that is done we will return to see her (by tomorrow)  Also - since NG tube is in would think Ir could do gastrostomy  Gatha Mayer, MD, [facility] Gastroenterology See Shea Evans on call - gastroenterology for best contact person 12/02/2022 10:27 AM

## 2022-12-02 NOTE — Progress Notes (Signed)
At 1900, patient accidentally pulled out her NG tube, per dayshift nurse Gretta Cool, patient has been complaining of throat discomfort the whole day and chlorhexedine spray  did not give her relief. Patient refuses to have it reinserted. Paged Gershon Cull, NP about situation. Patient blood sugar checked and started on IV fluid. Patient stated happiness on not having her NG tube reinserted.

## 2022-12-02 NOTE — Plan of Care (Signed)
  Problem: Clinical Measurements: Goal: Will remain free from infection Outcome: Not Progressing   Problem: Nutrition: Goal: Adequate nutrition will be maintained Outcome: Not Progressing   Problem: Safety: Goal: Ability to remain free from injury will improve Outcome: Not Progressing

## 2022-12-02 NOTE — Progress Notes (Signed)
Overnight   NAME: Kara Bailey MRN: [id number] DOB : 09-Nov-1956    Date of Service   12/02/2022   HPI/Events of Note   Notified by RN for patient removal of tube feeding.  RN relayed that patient has pulled out her NGT, Patient stated to RN that it had been "irritating her throat the whole day without relief with  chloraseptic spray". Patient is for G tube insertion. we were running osmolyte at 20 mls due to nausea yesterday. Patient has advised RN that she does not want the tube reinserted and has refused it to be reinserted.    Interventions/ Plan   Start CBG Q4 hours  Start D5 NS and adjust as needed for CBG in normal accepted POC ranges.      Gershon Cull BSN MSNA MSN ACNPC-AG Acute Care Nurse Practitioner [place]

## 2022-12-02 NOTE — Progress Notes (Signed)
PROGRESS NOTE    Kara Bailey  HER:[id number] DOB: [birth year] DOA: 11/29/2022 PCP: Andree Moro, DO     Brief Narrative:  H/o esophageal stenosis required dilation in 2017 and 2021, h/o HTN, HLD, CVA Presents to the hospital due to dysphagia   Subjective:   She has dysarthria, communicate by writing She has a new ng tube placed , now she is tolerating tube feeds, she wants to get out of bed She reports chronic spasm on left side  No n/v, no fever   Assessment & Plan:  Principal Problem:   Dysphagia as late effect of cerebrovascular accident (CVA) Active Problems:   Essential hypertension   Left hemiplegia ([place])   History of stroke   Dysphagia    Assessment and Plan:  * Dysphagia likely multifactorial   late effect of cerebrovascular accident (CVA)  Also has h/o esophageal dysphagia due to esophageal stenosis - Pt has not been able to tolerate liquids, pureed diet or any other form of nutrition. Unable to swallow any of her pills for at least 1 month.   -Pt has not been on plavix for about 1 month.  -DG esophagus ",Grossly nondiagnostic evaluation with minimal barium consumed. Noted is a delay in the oral phase of swallowing. Suggestion of transient laryngeal penetration. No gross tracheal aspiration."  GI consulted -Family desire G tube placement, IR plan for  g tube placement on Monday to Tuesday pending availability  -.continue LR infusion ,  temporary feeding tube placed on 1/19 for nutrition ,will need to watch refeeding syndrome as she has not eat much for a month -repeat mri brain did not show new CVA -speech swallow eval ordered per GI recommendation   -first ng tube malpositioned, replaced, now she is tolerating tube feeds     Hypomagnesemia/hypokalemia/hypophosphatemia Replaced, improved   History of stroke/Left hemiplegia  Has not been able to tolerate meds by mouth for the last month Chronic left side pain , reports from prior stroke, reports iv  morphine helped  Essential hypertension/HLD Has not had BP meds for over 1 month due to dysphagia. Bp stable currently   Bacteriuria -UA is cloudy with moderate leukocyte many bacteria -f/u on urine culture -Currently no fever no leukocytosis, monitor off antibiotics  H/o cirrhosis, H/o hepc per chart review, unknown details CT this admission did not show sign of liver cirrhosis  Reviewed abdominal ultrasound from 2014 showed  Slightly nodular contours of the liver may indicate mild cirrhosis.   H/p Alcohol use Six pack beer on the weekends per record   Current everyday smoker Trying to cut back wearing patches   Body mass index is 18.36 kg/m. Weight 57kg two month ago, today on 1/19 she weigh 51.2 kg         .   I have Reviewed nursing notes, Vitals, pain scores, I/o's, Lab results and  imaging results since pt's last encounter, details please see discussion above  I ordered the following labs:  Unresulted Labs (From admission, onward)     Start     Ordered   12/02/22 8563  Basic metabolic panel  Daily,   R     Question:  Specimen collection method  Answer:  Lab=Lab collect   12/02/22 0544   12/02/22 0545  Magnesium  5A & 5P,   R     Question:  Specimen collection method  Answer:  Lab=Lab collect   12/02/22 0544   12/02/22 0545  Phosphorus  5A & 5P,   R  Question:  Specimen collection method  Answer:  Lab=Lab collect   12/02/22 0544   12/01/22 1338  Urine Culture  (Urine Culture)  Once,   R       Question:  Indication  Answer:  Flank Pain   12/01/22 1337   11/29/22 1745  CBC with Differential  (ED Shortness of Breath)  Once,   STAT        11/29/22 1745             DVT prophylaxis: SCDs Start: 11/30/22 0151   Code Status:   Code Status: Full Code  Family Communication: son over the phone on 1/20 and 1/21 Disposition:   Dispo: The patient is from: home              Anticipated d/c is to: home              Anticipated d/c date is: some time next  week after cleared by IR and able to tolerate tube feeds, son requested snf placement   Antimicrobials:    Anti-infectives (From admission, onward)    Start     Dose/Rate Route Frequency Ordered Stop   12/03/22 0700  ceFAZolin (ANCEF) IVPB 2g/100 mL premix        2 g 200 mL/hr over 30 Minutes Intravenous To Radiology 11/30/22 1602 12/04/22 0700          Objective: Vitals:   12/01/22 1956 12/02/22 0500 12/02/22 0615 12/02/22 1033  BP: (!) 120/45  102/87 90/73  Pulse: 95  83 73  Resp: '18  19 20  '$ Temp: 97.6 F (36.4 C)  97.8 F (36.6 C) 97.6 F (36.4 C)  TempSrc: Oral  Oral Oral  SpO2: 100%  100% 100%  Weight:  51.6 kg    Height:        Intake/Output Summary (Last 24 hours) at 12/02/2022 1610 Last data filed at 12/02/2022 1500 Gross per 24 hour  Intake 336.66 ml  Output --  Net 336.66 ml   Filed Weights   11/30/22 1602 12/02/22 0500  Weight: 51.2 kg 51.6 kg    Examination:  General exam: alert, awake, communicative,calm, NAD, + NG Respiratory system: Clear to auscultation. Respiratory effort normal. Cardiovascular system:  RRR.  Gastrointestinal system: Abdomen is nondistended, soft and nontender.  Normal bowel sounds heard. Central nervous system: Alert and oriented.  Dysarthria, left hemiplegia. Extremities:  no edema Skin: No rashes, lesions or ulcers Psychiatry: Judgement and insight appear normal. Mood & affect appropriate.     Data Reviewed: I have personally reviewed  labs and visualized  imaging studies since the last encounter and formulate the plan        Scheduled Meds:  atorvastatin  80 mg Per Tube QPC supper   baclofen  5 mg Per Tube BID   diclofenac Sodium  4 g Topical QID   ezetimibe  10 mg Per Tube Daily   free water  100 mL Per Tube Q4H   melatonin  3 mg Per Tube QHS   pantoprazole (PROTONIX) IV  40 mg Intravenous Q12H   polyethylene glycol  17 g Per Tube Daily   sodium chloride flush  10-40 mL Intracatheter Q12H   thiamine  100  mg Per Tube Daily   tiZANidine  2 mg Per Tube BID   Continuous Infusions:  [START ON 12/03/2022]  ceFAZolin (ANCEF) IV     feeding supplement (OSMOLITE 1.5 CAL) 20 mL/hr at 12/02/22 0816   promethazine (PHENERGAN) injection (IM or IVPB)  Stopped (12/01/22 1212)     LOS: 1 day   Time spent: 50mns  FFlorencia Reasons MD PhD FACP [facility]  Available via Epic secure chat 7am-7pm for nonurgent issues Please page for urgent issues To page the attending provider between 7A-7P or the covering provider during after hours 7P-7A, please log into the web site [email or web address] and access using universal [place] password for that web site. If you do not have the password, please call the hospital operator.    12/02/2022, 4:10 PM

## 2022-12-03 ENCOUNTER — Inpatient Hospital Stay (HOSPITAL_COMMUNITY): Payer: Medicare HMO

## 2022-12-03 DIAGNOSIS — I69391 Dysphagia following cerebral infarction: Secondary | ICD-10-CM | POA: Diagnosis not present

## 2022-12-03 HISTORY — PX: IR GASTROSTOMY TUBE MOD SED: IMG625

## 2022-12-03 LAB — BASIC METABOLIC PANEL
Anion gap: 6 (ref 5–15)
BUN: <5 mg/dL — ABNORMAL LOW (ref 8–23)
CO2: 27 mmol/L (ref 22–32)
Calcium: 8.5 mg/dL — ABNORMAL LOW (ref 8.9–10.3)
Chloride: 108 mmol/L (ref 98–111)
Creatinine, Ser: 0.73 mg/dL (ref 0.44–1.00)
GFR, Estimated: >60 mL/min (ref >60)
Glucose, Bld: 102 mg/dL — ABNORMAL HIGH (ref 70–99)
Potassium: 4 mmol/L (ref 3.5–5.1)
Sodium: 141 mmol/L (ref 135–145)

## 2022-12-03 LAB — PHOSPHORUS: Phosphorus: 3.7 mg/dL (ref 2.5–4.6)

## 2022-12-03 LAB — GLUCOSE, CAPILLARY
Glucose-Capillary: 87 mg/dL (ref 70–99)
Glucose-Capillary: 92 mg/dL (ref 70–99)
Glucose-Capillary: 103 mg/dL — ABNORMAL HIGH (ref 70–99)
Glucose-Capillary: 71 mg/dL (ref 70–99)

## 2022-12-03 LAB — MAGNESIUM: Magnesium: 1.6 mg/dL — ABNORMAL LOW (ref 1.7–2.4)

## 2022-12-03 MED ORDER — SODIUM CHLORIDE 0.9 % IV SOLN: INTRAVENOUS | Status: DC

## 2022-12-03 MED ORDER — FENTANYL CITRATE (PF) 100 MCG/2ML IJ SOLN
INTRAMUSCULAR | Status: AC
  Filled 2022-12-03: qty 2

## 2022-12-03 MED ORDER — MORPHINE SULFATE (PF) 2 MG/ML IV SOLN
2.0000 mg | INTRAVENOUS | Status: DC | PRN
  Administered 2022-12-03 – 2022-12-23 (×80): 2 mg via INTRAVENOUS
  Filled 2022-12-03 (×82): qty 1

## 2022-12-03 MED ORDER — GUAIFENESIN-DM 100-10 MG/5ML PO SYRP
5.0000 mL | ORAL_SOLUTION | ORAL | Status: DC | PRN
  Filled 2022-12-03: qty 5

## 2022-12-03 MED ORDER — FENTANYL CITRATE (PF) 100 MCG/2ML IJ SOLN
INTRAMUSCULAR | Status: AC | PRN
  Administered 2022-12-03 (×2): 50 ug via INTRAVENOUS

## 2022-12-03 MED ORDER — MIDAZOLAM HCL 2 MG/2ML IJ SOLN
INTRAMUSCULAR | Status: AC | PRN
  Administered 2022-12-03 (×2): 1 mg via INTRAVENOUS

## 2022-12-03 MED ORDER — IOHEXOL 300 MG/ML  SOLN
50.0000 mL | Freq: Once | INTRAMUSCULAR | Status: AC | PRN
  Administered 2022-12-03: 15 mL

## 2022-12-03 MED ORDER — GLUCAGON HCL RDNA (DIAGNOSTIC) 1 MG IJ SOLR
INTRAMUSCULAR | Status: AC
  Filled 2022-12-03: qty 1

## 2022-12-03 MED ORDER — DEXTROSE-NACL 5-0.9 % IV SOLN: INTRAVENOUS | Status: AC

## 2022-12-03 MED ORDER — LIDOCAINE HCL 1 % IJ SOLN
INTRAMUSCULAR | Status: AC
  Administered 2022-12-03: 15 mL via INTRADERMAL
  Filled 2022-12-03: qty 20

## 2022-12-03 MED ORDER — MIDAZOLAM HCL 2 MG/2ML IJ SOLN
INTRAMUSCULAR | Status: AC
  Filled 2022-12-03: qty 2

## 2022-12-03 NOTE — Plan of Care (Signed)
  Problem: Nutrition: Goal: Adequate nutrition will be maintained Outcome: Not Progressing   Problem: Pain Managment: Goal: General experience of comfort will improve Outcome: Not Progressing   

## 2022-12-03 NOTE — Evaluation (Addendum)
Physical Therapy Evaluation Patient Details Name: Kara Bailey MRN: [id number] DOB: [birth year] Today's Date: 12/03/2022  History of Present Illness  Pt is 67 yo female admitted 11/29/22 with multifactorial dysphagia with plan for PEG tube.  Pt with hx of CVA with L hemiparesis, HTN, iliac stent, hep C, GERD  Clinical Impression  Pt admitted with above diagnosis. At baseline pt resides with daughter and pivots to w/c with assist.  She does get HHPT and reports was working on walking.  Today, pt requiring min A for bed mobility and mod A to stand pivot to chair.  Had assist of 2 for safety with evaluation but could pivot with assist of 1, if working on gait would need +2.  Pt has support at home and likely near baseline.  Will benefit from PT while hospitalized to maintain and progress as able.  Pt currently with functional limitations due to the deficits listed below (see PT Problem List). Pt will benefit from skilled PT to increase their independence and safety with mobility to allow discharge to the venue listed below.    ADDENDUMN 14:00: Received message from case manager that daughter is having trouble physically managing pt at home as she is weaker.  Updated recommendation to SNF, but may also need to consider long term options in future due to chronic L hemiplegia.         Recommendations for follow up therapy are one component of a multi-disciplinary discharge planning process, led by the attending physician.  Recommendations may be updated based on patient status, additional functional criteria and insurance authorization.  Follow Up Recommendations Skilled nursing-short term rehab (<3 hours/day) Can patient physically be transported by private vehicle: No    Assistance Recommended at Discharge Frequent or constant Supervision/Assistance  Patient can return home with the following  A little help with walking and/or transfers;A little help with bathing/dressing/bathroom;Assistance with  cooking/housework;Help with stairs or ramp for entrance    Equipment Recommendations None recommended by PT  Recommendations for Other Services       Functional Status Assessment Patient has had a recent decline in their functional status and demonstrates the ability to make significant improvements in function in a reasonable and predictable amount of time.     Precautions / Restrictions Precautions Precautions: Fall Restrictions Weight Bearing Restrictions: No      Mobility  Bed Mobility Overal bed mobility: Needs Assistance Bed Mobility: Supine to Sit     Supine to sit: Min assist          Transfers Overall transfer level: Needs assistance Equipment used: 2 person hand held assist Transfers: Sit to/from Stand, Bed to chair/wheelchair/BSC Sit to Stand: Mod assist, +2 safety/equipment Stand pivot transfers: Mod assist, +2 safety/equipment         General transfer comment: Had assist of 2 for initial eval for safety; however, pt could perform with mod A of 1 with pivot toward R side.  Would need +2 for gait    Ambulation/Gait                  Stairs            Wheelchair Mobility    Modified Rankin (Stroke Patients Only)       Balance Overall balance assessment: Needs assistance Sitting-balance support: No upper extremity supported Sitting balance-Leahy Scale: Fair     Standing balance support: Single extremity supported Standing balance-Leahy Scale: Poor Standing balance comment: R UE support on therajpist and min A for static  stance                             Pertinent Vitals/Pain Pain Assessment Pain Assessment: No/denies pain    Home Living Family/patient expects to be discharged to:: Private residence Living Arrangements: Children Available Help at Discharge: Family;Available 24 hours/day Type of Home: House Home Access: Level entry       Home Layout: Two level;Able to live on main level with  bedroom/bathroom Home Equipment: Wheelchair - manual;Hospital bed;BSC/3in1      Prior Function Prior Level of Function : Needs assist             Mobility Comments: Pt reports performing stand pivot transfers to w/c with assist from daughter.  She was able to mobilize in w/c with R side.  Reports was getting HHPT and working on walking ADLs Comments: Requiring assist for ADLs     Hand Dominance   Dominant Hand: Right    Extremity/Trunk Assessment   Upper Extremity Assessment Upper Extremity Assessment: RUE deficits/detail;LUE deficits/detail RUE Deficits / Details: Grossly WFL LUE Deficits / Details: Contracted    Lower Extremity Assessment Lower Extremity Assessment: LLE deficits/detail;RLE deficits/detail RLE Deficits / Details: ROM WFL; MMT 5/5 LLE Deficits / Details: AAROM: WFL; MMT: all 1/5 throughout; noting increased tone hamstrings with attempts to straighten knee    Cervical / Trunk Assessment Cervical / Trunk Assessment: Normal  Communication   Communication: Expressive difficulties (Pt is able to speak some and writes to communicate; able to make needs known)  Cognition Arousal/Alertness: Awake/alert Behavior During Therapy: WFL for tasks assessed/performed Overall Cognitive Status: Within Functional Limits for tasks assessed                                 General Comments: Pt reports ready to get PEG tube so she can go home        General Comments      Exercises     Assessment/Plan    PT Assessment Patient needs continued PT services  PT Problem List Decreased strength;Decreased range of motion;Decreased activity tolerance;Decreased balance;Decreased mobility;Decreased knowledge of precautions;Decreased knowledge of use of DME       PT Treatment Interventions DME instruction;Therapeutic exercise;Wheelchair mobility training;Gait training;Balance training;Functional mobility training;Therapeutic activities;Patient/family education     PT Goals (Current goals can be found in the Care Plan section)  Acute Rehab PT Goals Patient Stated Goal: get PEG tube and go home PT Goal Formulation: With patient Time For Goal Achievement: 12/17/22 Potential to Achieve Goals: Good    Frequency Min 2X/week     Co-evaluation               AM-PAC PT "6 Clicks" Mobility  Outcome Measure Help needed turning from your back to your side while in a flat bed without using bedrails?: A Little Help needed moving from lying on your back to sitting on the side of a flat bed without using bedrails?: A Little Help needed moving to and from a bed to a chair (including a wheelchair)?: A Lot Help needed standing up from a chair using your arms (e.g., wheelchair or bedside chair)?: A Lot Help needed to walk in hospital room?: Total Help needed climbing 3-5 steps with a railing? : Total 6 Click Score: 12    End of Session Equipment Utilized During Treatment: Gait belt Activity Tolerance: Patient tolerated treatment well Patient left:  with chair alarm set;in chair;with call bell/phone within reach Nurse Communication: Mobility status (notified RN and tech and on white board - mod A pivot to R, +2 helpful) PT Visit Diagnosis: Other abnormalities of gait and mobility (R26.89);Hemiplegia and hemiparesis Hemiplegia - Right/Left: Left Hemiplegia - dominant/non-dominant: Non-dominant Hemiplegia - caused by: Cerebral infarction    Time: 0919-8022 PT Time Calculation (min) (ACUTE ONLY): 19 min   Charges:   PT Evaluation $PT Eval Low Complexity: 1 Low          Jeane Cashatt, PT Acute Rehab [facility] [phone]   Karlton Lemon 12/03/2022, 2:00 PM

## 2022-12-03 NOTE — NC FL2 (Signed)
[place] LEVEL OF CARE FORM     IDENTIFICATION  Patient Name: Kara Bailey Birthdate: [birth year] Sex: female Admission Date (Current Location): 11/29/2022  [facility] and [place] Number:  Herbalist and Address:  [facility],  [address], [place]      Provider Number: [id number]  Attending Physician Name and Address:  Nita Sells, MD  Relative Name and Phone Number:  Taren Dymek [phone]    Current Level of Care: SNF Recommended Level of Care: [place] Prior Approval Number:    Date Approved/Denied:   PASRR Number: [id number] (found under [place] Melter lower case v)  Discharge Plan: SNF    Current Diagnoses: Patient Active Problem List   Diagnosis Date Noted   History of stroke 11/29/2022   Dysphagia as late effect of cerebrovascular accident (CVA) 11/29/2022   Stroke ([place]) 07/26/2022   Left hemiplegia ([place]) 07/26/2022   Peripheral arterial disease ([place]) 12/20/2020   History of esophageal stricture 07/08/2020   Insomnia 12/02/2017   Chronic cough 08/14/2017   Poor appetite 08/29/2016   Peptic stricture of esophagus    History of colonic polyps    Hiatal hernia 03/16/2016   Dysphagia 03/16/2016   Alcohol abuse 10/26/2015   Essential hypertension 09/26/2015   PAC (premature atrial contraction) 10/11/2014   PVC (premature ventricular contraction) 10/11/2014   Back muscle spasm 06/15/2014   Generalized anxiety disorder 06/15/2014   Smoking 06/15/2014   Chronic hepatitis C without hepatic coma ([place]) 05/28/2013    Orientation RESPIRATION BLADDER Height & Weight     Self, Time, Situation, Place (expressive aphasia)  Normal Incontinent Weight: 52.5 kg Height:  '5\' 6"'$  (167.6 cm)  BEHAVIORAL SYMPTOMS/MOOD NEUROLOGICAL BOWEL NUTRITION STATUS   (n/a)  (left upper extremity contracted) Incontinent Feeding tube (PEG tube to be placed today)  AMBULATORY STATUS COMMUNICATION OF NEEDS Skin    Limited Assist Verbally (expressive aphasia difficulty speaking) Normal                       Personal Care Assistance Level of Assistance  Bathing, Feeding, Dressing Bathing Assistance: Limited assistance Feeding assistance:  (NPO PEG tube) Dressing Assistance: Limited assistance     Functional Limitations Info  Sight, Hearing, Speech Sight Info: Impaired Hearing Info: Adequate Speech Info: Impaired (expressive aphasia)    SPECIAL CARE FACTORS FREQUENCY  PT (By licensed PT), OT (By licensed OT)     PT Frequency: 5x/wk OT Frequency: 5x/wk            Contractures Contractures Info: Present (left upper extremity)    Additional Factors Info  Code Status, Allergies, Psychotropic, Insulin Sliding Scale, Isolation Precautions, Suctioning Needs Code Status Info: Full Allergies Info: no known allergies Psychotropic Info: n/a   see discharge summary Insulin Sliding Scale Info: n/a see discharge summary Isolation Precautions Info: n/a Suctioning Needs: n/a   Current Medications (12/03/2022):  This is the current hospital active medication list Current Facility-Administered Medications  Medication Dose Route Frequency Provider Last Rate Last Admin   0.9 %  sodium chloride infusion   Intravenous Continuous Nita Sells, MD 75 mL/hr at 12/03/22 1328 New Bag at 12/03/22 1328   acetaminophen (TYLENOL) suppository 650 mg  650 mg Rectal Q4H PRN Kristopher Oppenheim, DO       atorvastatin (LIPITOR) tablet 80 mg  80 mg Per Tube QPC supper Florencia Reasons, MD   80 mg at 12/02/22 1738   baclofen (LIORESAL) tablet 5 mg  5 mg Per NG tube Q8H PRN Florencia Reasons, MD       ceFAZolin (ANCEF) IVPB 2g/100 mL premix  2 g Intravenous to XRAY Boisseau, Hayley, PA       diclofenac Sodium (VOLTAREN) 1 % topical gel 4 g  4 g Topical QID Kristopher Oppenheim, DO   4 g at 12/03/22 1326   ezetimibe (ZETIA) tablet 10 mg  10 mg Per Tube Daily Florencia Reasons, MD   10 mg at 12/02/22 7654   feeding supplement (OSMOLITE 1.5 CAL) liquid  1,000 mL  1,000 mL Per Tube Continuous Florencia Reasons, MD   Stopped at 12/02/22 1900   free water 100 mL  100 mL Per Tube Q4H Florencia Reasons, MD   100 mL at 12/02/22 1537   iohexol (OMNIPAQUE) 300 MG/ML solution 100 mL  100 mL Per Tube Once PRN Boisseau, Hayley, PA       melatonin tablet 3 mg  3 mg Per Tube QHS Florencia Reasons, MD   3 mg at 12/01/22 2007   morphine (PF) 2 MG/ML injection 2 mg  2 mg Intravenous Q4H PRN Florencia Reasons, MD   2 mg at 12/03/22 1437   ondansetron (ZOFRAN) tablet 4 mg  4 mg Oral Q6H PRN Kristopher Oppenheim, DO       Or   ondansetron [facility]) injection 4 mg  4 mg Intravenous Q6H PRN Kristopher Oppenheim, DO   4 mg at 12/01/22 0553   pantoprazole (PROTONIX) injection 40 mg  40 mg Intravenous Q12H Florencia Reasons, MD   40 mg at 12/03/22 0331   phenol (CHLORASEPTIC) mouth spray 1 spray  1 spray Mouth/Throat PRN Florencia Reasons, MD   1 spray at 12/02/22 1738   polyethylene glycol (MIRALAX / GLYCOLAX) packet 17 g  17 g Per Tube Daily Florencia Reasons, MD   17 g at 12/02/22 6503   thiamine (VITAMIN B1) tablet 100 mg  100 mg Per Tube Daily Florencia Reasons, MD   100 mg at 12/02/22 0813   tiZANidine (ZANAFLEX) tablet 2 mg  2 mg Per Tube BID Florencia Reasons, MD   2 mg at 12/02/22 5465     Discharge Medications: Please see discharge summary for a list of discharge medications.  Relevant Imaging Results:  Relevant Lab Results:   Additional Information [id number]  Angelita Ingles, RN

## 2022-12-03 NOTE — Progress Notes (Signed)
PROGRESS NOTE   Kara Bailey  ZOX:[id number] DOB: [birth year] DOA: 11/29/2022 PCP: Andree Moro, DO  Brief Narrative:  67 year old black female Prior CVA, HTN prior epigastric hernia repair, iliac stent placement Dr. Alvester Chou New CVA recently 07/2022 placed on aspirin Plavix Prior hep C status posttreatment Was placed on dysphagia diet at that time-has a history of reflux disease with esophagitis and has had dilatation in the past back in 07/2019 when she had balloon dilatation  She has become intolerant of swallowing any pills and can only tolerate small amounts of sips of fluid Presented to ED at request of her PCP Felt to have multifactorial dysphagia, DG esophagus showed delay in oral phase placed on IV fluids  Hospital-Problem based course  Multifactorial dysphagia secondary to stroke and probable stricturing - PEG tube to be placed today - Speech therapy to follow-up for diet recommendations-DG esophagus showed nondiagnostic eval with delayed oral phase, continue PPI - Keep on NS 75 cc/H until procedure is done - Watch for refeeding syndrome  Risk of refeeding syndrome - Defer to dietitian watch labs  Dense left hemiplegia since September 2023 - Resume Plavix 75 on discharge - Secondary prevention meds include aspirin 81 atorvastatin 80 and Zetia 10 - Should resume baclofen 5 every 8 as well as Zanaflex 2 twice daily for dense left-sided hemiplegia and spasticity  HTN - Continue eventually Cardizem to 40 mg and monitor trends  Depression - Celexa to be resumed as outpatient if felt necessary, holding Xanax at this time   DVT prophylaxis:  Code Status: full Family Communication: spoke with ricky Ritthaler [phone] Disposition:  Status is: Inpatient Remains inpatient appropriate because:   Needs PEg placed and initiate tube feeds Will require skilled placement  Subjective: Awake coherent in nad no focal Communicates with both speech and  writing  Objective: Vitals:   12/02/22 1959 12/02/22 2019 12/03/22 0332 12/03/22 0522  BP:  91/62  109/69  Pulse: 85   84  Resp: 20   20  Temp: 97.8 F (36.6 C)   98 F (36.7 C)  TempSrc: Oral   Oral  SpO2: 99%   100%  Weight:   52.5 kg   Height:        Intake/Output Summary (Last 24 hours) at 12/03/2022 1214 Last data filed at 12/03/2022 0339 Gross per 24 hour  Intake 368.95 ml  Output 200 ml  Net 168.95 ml   Filed Weights   11/30/22 1602 12/02/22 0500 12/03/22 0332  Weight: 51.2 kg 51.6 kg 52.5 kg    Examination:  Eomi ncat does seem to have dense left-sided weakness both left upper and lower extremity Chest is clear No rales no rhonchi no wheeze No icterus no pallor  Data Reviewed: personally reviewed   CBC    Component Value Date/Time   WBC 8.0 11/29/2022 2100   RBC 4.10 11/29/2022 2100   HGB 11.7 (L) 11/29/2022 2100   HGB 14.5 03/09/2022 0958   HCT 36.8 11/29/2022 2100   HCT 43.3 03/09/2022 0958   PLT 215 11/29/2022 2100   PLT 263 03/09/2022 0958   MCV 89.8 11/29/2022 2100   MCV 89 03/09/2022 0958   MCH 28.5 11/29/2022 2100   MCHC 31.8 11/29/2022 2100   RDW 16.7 (H) 11/29/2022 2100   RDW 13.4 03/09/2022 0958   LYMPHSABS 2.5 11/29/2022 2100   LYMPHSABS 2.3 08/14/2017 1610   MONOABS 0.7 11/29/2022 2100   EOSABS 0.6 (H) 11/29/2022 2100   EOSABS 0.1 08/14/2017 1610   BASOSABS  0.0 11/29/2022 2100   BASOSABS 0.0 08/14/2017 1610      Latest Ref Rng & Units 12/02/2022    8:56 AM 12/01/2022    7:11 AM 11/29/2022    6:00 PM  CMP  Glucose 70 - 99 mg/dL 103  117  79   BUN 8 - 23 mg/dL '5  5  11   '$ Creatinine 0.44 - 1.00 mg/dL 0.69  0.53  0.97   Sodium 135 - 145 mmol/L 139  142  142   Potassium 3.5 - 5.1 mmol/L 4.4  3.0  4.3   Chloride 98 - 111 mmol/L 108  108  104   CO2 22 - 32 mmol/L '24  25  22   '$ Calcium 8.9 - 10.3 mg/dL 8.1  8.9  9.4   Total Protein 6.5 - 8.1 g/dL   6.6   Total Bilirubin 0.3 - 1.2 mg/dL   1.0   Alkaline Phos 38 - 126 U/L   52   AST  15 - 41 U/L   22   ALT 0 - 44 U/L   11      Radiology Studies: DG Shoulder Left Port  Result Date: 12/01/2022 CLINICAL DATA:  Pain EXAM: LEFT SHOULDER COMPARISON:  None Available. FINDINGS: Bones are mildly osteopenic. There is no acute fracture or dislocation identified. Joint spaces are maintained. Soft tissues are within normal limits. IMPRESSION: 1. No acute fracture or dislocation identified. 2. Mild osteopenia. Electronically Signed   By: Ronney Asters M.D.   On: 12/01/2022 19:39   MR BRAIN WO CONTRAST  Result Date: 12/01/2022 CLINICAL DATA:  Neuro deficit, acute, stroke suspected dysphagia, prior CVA EXAM: MRI HEAD WITHOUT CONTRAST TECHNIQUE: Multiplanar, multiecho pulse sequences of the brain and surrounding structures were obtained without intravenous contrast. COMPARISON:  MRI head 07/26/2022. FINDINGS: Brain: Remote hemorrhagic superior right cerebellar infarct, similar. Remote perforator infarcts in the right basal ganglia and bilateral remote thalamic infarcts. Additional patchy T2/FLAIR hyperintensity within the white matter, compatible with chronic microvascular ischemic disease. No evidence of acute infarct, acute hemorrhage, midline shift, hydrocephalus or extra-axial fluid collection. Vascular: Major arterial flow voids are maintained at the skull base. Skull and upper cervical spine: Normal marrow signal. Sinuses/Orbits: Largely clear sinuses.  No acute orbital findings. Other: No mastoid effusions. IMPRESSION: 1. No evidence of acute intracranial abnormality. 2. Infratentorial and supratentorial remote infarcts, as above. Electronically Signed   By: Margaretha Sheffield M.D.   On: 12/01/2022 17:22   DG Abd 1 View  Result Date: 12/01/2022 CLINICAL DATA:  NG tube placement. EXAM: ABDOMEN - 1 VIEW COMPARISON:  Earlier same day FINDINGS: 1622 hours. NG tube is looped in the stomach with proximal side port well below the GE junction. Nonspecific bowel gas pattern with retrocardiac left  base atelectasis/infiltrate noted. IMPRESSION: NG tube is looped in the stomach with proximal side port well below the GE junction. Electronically Signed   By: Misty Stanley M.D.   On: 12/01/2022 17:12   DG Abd 1 View  Result Date: 12/01/2022 CLINICAL DATA:  Nausea, nasogastric tube placement EXAM: ABDOMEN - 1 VIEW COMPARISON:  Portable exam 1402 hours compared to 11/30/2022 FINDINGS: No nasogastric tube identified. Atherosclerotic calcification aorta. Subsegmental atelectasis both lung bases. Nonobstructive bowel gas pattern. Retained contrast RIGHT colon. IMPRESSION: No nasogastric tube identified. Aortic Atherosclerosis (ICD10-I70.0). Findings called to Roj RN on [place] on 12/01/2022 at 1500 hours. Electronically Signed   By: Lavonia Dana M.D.   On: 12/01/2022 15:01     Scheduled Meds:  atorvastatin  80 mg Per Tube QPC supper   diclofenac Sodium  4 g Topical QID   ezetimibe  10 mg Per Tube Daily   free water  100 mL Per Tube Q4H   melatonin  3 mg Per Tube QHS   pantoprazole (PROTONIX) IV  40 mg Intravenous Q12H   polyethylene glycol  17 g Per Tube Daily   sodium chloride flush  10-40 mL Intracatheter Q12H   thiamine  100 mg Per Tube Daily   tiZANidine  2 mg Per Tube BID   Continuous Infusions:   ceFAZolin (ANCEF) IV     dextrose 5 % and 0.9% NaCl 30 mL/hr at 12/03/22 0529   feeding supplement (OSMOLITE 1.5 CAL) Stopped (12/02/22 1900)   promethazine (PHENERGAN) injection (IM or IVPB) Stopped (12/01/22 1212)     LOS: 2 days   Time spent: [place], MD [facility] To contact the attending provider between 7A-7P or the covering provider during after hours 7P-7A, please log into the web site [email or web address] and access using universal [place] password for that web site. If you do not have the password, please call the hospital operator.  12/03/2022, 12:14 PM

## 2022-12-03 NOTE — Progress Notes (Signed)
SLP Cancellation Note  Patient Details Name: Kara Bailey MRN: [id number] DOB: 19-Apr-1956   Cancelled treatment:       Reason Eval/Treat Not Completed: Medical issues which prohibited therapy;Other (comment) (NPO, potential PEG today). SLP secure message chatted with MD who advised to hold off on swallow evaluation at this time due to patient potential PEG placement today. SLP will plan to complete swallow evaluation at bedside next date unless any changes in current POC.   Sonia Baller, MA, CCC-SLP Speech Therapy

## 2022-12-03 NOTE — Procedures (Signed)
Vascular and Interventional Radiology Procedure Note  Patient: Kara Bailey DOB: Jul 24, 1956 Medical Record Number: [id number] Note Date/Time: 12/03/22 5:49 PM   Performing Physician: Michaelle Birks, MD Assistant(s): None  Diagnosis: Dysphagia  Procedure: PERCUTANEOUS GASTROSTOMY TUBE PLACEMENT  Anesthesia: Conscious Sedation Complications: None Estimated Blood Loss: Minimal  Findings:  Successful placement of a  27 F gastrostomy tube under fluoroscopy.   See detailed procedure note with images in PACS. The patient tolerated the procedure well without incident or complication and was returned to Floor Bed in stable condition.    Michaelle Birks, MD [facility] Radiology   Pager. [place]

## 2022-12-03 NOTE — TOC Progression Note (Addendum)
Transition of Care [facility]) - Progression Note    Patient Details  Name: Kara Bailey MRN: [id number] Date of Birth: February 27, 1956  [facility] CM/SW [place], RN Phone Number:[phone]  12/03/2022, 8:49 AM  Clinical Narrative:    TOC acknowledges consult to "Please coordinate with son [name] regarding SNF placement/ HH/DME needs. " TOC will initiates workup after therapy eval. TOC following awaiting therapy recommendations.   1330 CM received message from MD requesting that someone speak to son about placement. CM called Son [name]. [name] is concerned that his sister patients daughter can not physically take care of patient at home. Per son daughter is having trouble physically due to patient having weakness and mobility issues. Son states that his mother came home from the hospital previously without rehab and was very weak with frequent falls. CM has made son aware that CM will make therapy aware of concerns. Message has been sent to PT to determine if patient would benefit or meets criteria for SNF.   1409 Therapy recommendation has been updated. Son [name] has been made aware. CM to initiate SNF workup.   2595 PASRR # [id number] , FL2 completed. Patient info faxed out for bed offers.        Expected Discharge Plan and Services                                               Social Determinants of Health (SDOH) Interventions SDOH Screenings   Depression (PHQ2-9): High Risk (09/19/2022)  Tobacco Use: High Risk (11/29/2022)    Readmission Risk Interventions     No data to display

## 2022-12-03 NOTE — Progress Notes (Addendum)
Progress Note  Primary GI: Dr. Loletha Carrow   Subjective  Chief Complaint:Dysphagia and odynophagia  Patient without family at bedside. States the pain is better, very happy with the NG tube out. Plan for IR G-tube today. Denies any pain or issues.    Objective   Vital signs in last 24 hours: Temp:  [97.8 F (36.6 C)-98 F (36.7 C)] 98 F (36.7 C) (01/22 0522) Pulse Rate:  [84-85] 84 (01/22 0522) Resp:  [20] 20 (01/22 0522) BP: (91-109)/(62-69) 109/69 (01/22 0522) SpO2:  [99 %-100 %] 100 % (01/22 0522) Weight:  [52.5 kg] 52.5 kg (01/22 0332) Last BM Date : 11/29/22 Last BM recorded by nurses in past 5 days No data recorded  General:  chronically ill appearing,  female in no acute distress,  Heart:  Regular rate and rhythm; no murmurs Pulm: Clear anteriorly; no wheezing Abdomen:  Soft, Non-distended AB, Sluggish bowel sounds.  tenderness  Extremities:  without  edema, right arm with spastic hand and held tightly to her body with pain, left leg with decreased movement Neurologic:  Alert and  oriented x4;  hemiparesis left side with spacity, dysarthria Psych:  Cooperative. Normal mood and affect.  Intake/Output from previous day: 01/21 0701 - 01/22 0700 In: 369 [I.V.:215.8; NG/GT:153.2] Out: 200 [Urine:200] Intake/Output this shift: No intake/output data recorded.  Studies/Results: DG Shoulder Left Port  Result Date: 12/01/2022 CLINICAL DATA:  Pain EXAM: LEFT SHOULDER COMPARISON:  None Available. FINDINGS: Bones are mildly osteopenic. There is no acute fracture or dislocation identified. Joint spaces are maintained. Soft tissues are within normal limits. IMPRESSION: 1. No acute fracture or dislocation identified. 2. Mild osteopenia. Electronically Signed   By: Ronney Asters M.D.   On: 12/01/2022 19:39   MR BRAIN WO CONTRAST  Result Date: 12/01/2022 CLINICAL DATA:  Neuro deficit, acute, stroke suspected dysphagia, prior CVA EXAM: MRI HEAD WITHOUT CONTRAST TECHNIQUE:  Multiplanar, multiecho pulse sequences of the brain and surrounding structures were obtained without intravenous contrast. COMPARISON:  MRI head 07/26/2022. FINDINGS: Brain: Remote hemorrhagic superior right cerebellar infarct, similar. Remote perforator infarcts in the right basal ganglia and bilateral remote thalamic infarcts. Additional patchy T2/FLAIR hyperintensity within the white matter, compatible with chronic microvascular ischemic disease. No evidence of acute infarct, acute hemorrhage, midline shift, hydrocephalus or extra-axial fluid collection. Vascular: Major arterial flow voids are maintained at the skull base. Skull and upper cervical spine: Normal marrow signal. Sinuses/Orbits: Largely clear sinuses.  No acute orbital findings. Other: No mastoid effusions. IMPRESSION: 1. No evidence of acute intracranial abnormality. 2. Infratentorial and supratentorial remote infarcts, as above. Electronically Signed   By: Margaretha Sheffield M.D.   On: 12/01/2022 17:22   DG Abd 1 View  Result Date: 12/01/2022 CLINICAL DATA:  NG tube placement. EXAM: ABDOMEN - 1 VIEW COMPARISON:  Earlier same day FINDINGS: 1622 hours. NG tube is looped in the stomach with proximal side port well below the GE junction. Nonspecific bowel gas pattern with retrocardiac left base atelectasis/infiltrate noted. IMPRESSION: NG tube is looped in the stomach with proximal side port well below the GE junction. Electronically Signed   By: Misty Stanley M.D.   On: 12/01/2022 17:12   DG Abd 1 View  Result Date: 12/01/2022 CLINICAL DATA:  Nausea, nasogastric tube placement EXAM: ABDOMEN - 1 VIEW COMPARISON:  Portable exam 1402 hours compared to 11/30/2022 FINDINGS: No nasogastric tube identified. Atherosclerotic calcification aorta. Subsegmental atelectasis both lung bases. Nonobstructive bowel gas pattern. Retained contrast RIGHT colon. IMPRESSION: No nasogastric tube  identified. Aortic Atherosclerosis (ICD10-I70.0). Findings called to Roj  RN on [place] on 12/01/2022 at 1500 hours. Electronically Signed   By: Lavonia Dana M.D.   On: 12/01/2022 15:01    Lab Results: No results for input(s): "WBC", "HGB", "HCT", "PLT" in the last 72 hours. BMET Recent Labs    12/01/22 0711 12/02/22 0856  NA 142 139  K 3.0* 4.4  CL 108 108  CO2 25 24  GLUCOSE 117* 103*  BUN 5* <5*  CREATININE 0.53 0.69  CALCIUM 8.9 8.1*    Scheduled Meds:  atorvastatin  80 mg Per Tube QPC supper   diclofenac Sodium  4 g Topical QID   ezetimibe  10 mg Per Tube Daily   free water  100 mL Per Tube Q4H   melatonin  3 mg Per Tube QHS   pantoprazole (PROTONIX) IV  40 mg Intravenous Q12H   polyethylene glycol  17 g Per Tube Daily   sodium chloride flush  10-40 mL Intracatheter Q12H   thiamine  100 mg Per Tube Daily   tiZANidine  2 mg Per Tube BID   Continuous Infusions:   ceFAZolin (ANCEF) IV     dextrose 5 % and 0.9% NaCl 30 mL/hr at 12/03/22 0529   feeding supplement (OSMOLITE 1.5 CAL) Stopped (12/02/22 1900)   promethazine (PHENERGAN) injection (IM or IVPB) Stopped (12/01/22 1212)      Patient profile:   67 year old female status post ischemic stroke 07/2022 left hemiplegia continuing dysphagia and dysarthria presenting with failure to thrive and dysphagia.  Previous distal esophageal stricture dilated 2021-15.5, 07/27/2022 pharyngeal dysphagia bedside swallow aspiration   Impression/Plan:   Dysphagia/odynophagia Patient has previous history of esophageal stricture last dilated 2021, with significant history of ischemic stroke, oropharyngeal dysphagia suggest continuing with speech pathology prior to any endoscopic evaluation. Patient planning on getting IR G-tube today, speech pathology deferred today, please evaluate when able.   Failure to thrive Albumin 11/29/2022  3.5  BMI body mass index is 18.68 kg/m.  - RD consult Count calories, increase protein, for IR G-tube  Ischemic stroke 07/2022 with dysarthria Repeat MRI negative   LOS: 2 days    Vladimir Crofts  12/03/2022, 11:24 AM   Attending Physician Note   I have taken an interval history, reviewed the chart and examined the patient. I performed a substantive portion of this encounter, including complete performance of at least one of the key components, in conjunction with the APP. I agree with the APP's note, impression and recommendations with my edits. My additional impressions and recommendations are as follows.   Oropharyngeal dysphagia, for IR G-tube today.   Lucio Edward, MD [facility] See AM[facility], [facility], for our on call provider

## 2022-12-03 NOTE — Progress Notes (Signed)
Initial Nutrition Assessment  DOCUMENTATION CODES:   Severe malnutrition in context of chronic illness  INTERVENTION:   Monitor magnesium, potassium, and phosphorus BID for at least 3 days, MD to replete as needed, as pt is at risk for refeeding syndrome.  Once PEG placed and ready to use: -Initiate Osmolite 1.5 @ 20 ml/hr, advance by 10 ml every 12 hours to goal rate of 55 ml/hr. -Free water: 100 ml every 4 hours (600 ml) -Provides 1980 kcals, 82g protein and 1605 ml H2O  -Multivitamin with minerals daily via tube -Continue Thiamine given refeeding risk  NUTRITION DIAGNOSIS:   Severe Malnutrition related to chronic illness, dysphagia (h/o CVA) as evidenced by moderate fat depletion, severe muscle depletion, energy intake < or equal to 75% for > or equal to 1 month, percent weight loss.  GOAL:   Patient will meet greater than or equal to 90% of their needs  MONITOR:   PO intake, Supplement acceptance, Labs, Weight trends, I & O's  REASON FOR ASSESSMENT:   Consult Enteral/tube feeding initiation and management  ASSESSMENT:   67 year old African-American female history of multiple strokes, dense left hemiparesis, hypertension who presents to the ER today with worsening dysphagia.  Patient in room, has dysarthria, communicates by writing. At times pt would become upset about not knowing what is going on. Pt awaiting PEG placement today. Pt did not tolerate having NGT placed this weekend and refuses to have it replaced.  Pt states she has been unable to eat for 4 weeks now. Was unable to meet her needs with fluids alone.   Pt to resume continuous feeds once PEG is ready to use. Given h/o CVA and left hemiplegia, bolus feeds would likely be difficult to administer.  Per weight records, pt has lost 29 lbs since 07/28/22 (20% wt loss x 4 months, significant for time frame).   Medications:  Miralax, Thiamine  Labs reviewed: CBGs: 84-103 Low Mg   NUTRITION - FOCUSED PHYSICAL  EXAM:  Flowsheet Row Most Recent Value  Orbital Region Severe depletion  Upper Arm Region Moderate depletion  Thoracic and Lumbar Region Moderate depletion  Buccal Region Moderate depletion  Temple Region Moderate depletion  Clavicle Bone Region Severe depletion  Clavicle and Acromion Bone Region Severe depletion  Scapular Bone Region Moderate depletion  Dorsal Hand Moderate depletion  Patellar Region Severe depletion  Anterior Thigh Region Severe depletion  Posterior Calf Region Severe depletion  Edema (RD Assessment) None  Hair Reviewed  Eyes Reviewed  Mouth Reviewed  [dry mouth, missing teeth]  Skin Reviewed       Diet Order:   Diet Order             Diet NPO time specified  Diet effective now                   EDUCATION NEEDS:   Education needs have been addressed  Skin:  Skin Assessment: Reviewed RN Assessment  Last BM:  1/21  Height:   Ht Readings from Last 1 Encounters:  11/30/22 '5\' 6"'$  (1.676 m)    Weight:   Wt Readings from Last 1 Encounters:  12/03/22 52.5 kg    BMI:  Body mass index is 18.68 kg/m.  Estimated Nutritional Needs:   Kcal:  8588-5027  Protein:  80-95g  Fluid:  1.8L/day  Clayton Bibles, MS, RD, LDN Inpatient Clinical Dietitian Contact information available via Amion

## 2022-12-03 NOTE — Progress Notes (Signed)
Pt with c/o pain at peg tube site. Dr. Verlon Au, hospitalist, notified for an order since it was not time for another dose of pain med. Pain med order modified by Dr. Verlon Au.  Dr. Verlon Au also asked this writer to notify IR Provider of Pt's pain. Called [facility] to make on-call IR Provider aware of patient's pain after peg placement. Left a message with call-back number. Call received from on-call Provider,who reported that  it was not uncommon for pt to experience pain at Peg tube site after placement and to medicate pt with prescribed pain med if needed. Pt medicated per order.

## 2022-12-04 ENCOUNTER — Inpatient Hospital Stay (HOSPITAL_COMMUNITY): Payer: Medicare HMO

## 2022-12-04 DIAGNOSIS — I69391 Dysphagia following cerebral infarction: Secondary | ICD-10-CM | POA: Diagnosis not present

## 2022-12-04 DIAGNOSIS — R1311 Dysphagia, oral phase: Secondary | ICD-10-CM | POA: Diagnosis not present

## 2022-12-04 DIAGNOSIS — E43 Unspecified severe protein-calorie malnutrition: Secondary | ICD-10-CM | POA: Insufficient documentation

## 2022-12-04 LAB — COMPREHENSIVE METABOLIC PANEL
ALT: 15 U/L (ref 0–44)
AST: 25 U/L (ref 15–41)
Albumin: 2.4 g/dL — ABNORMAL LOW (ref 3.5–5.0)
Alkaline Phosphatase: 62 U/L (ref 38–126)
Anion gap: 7 (ref 5–15)
BUN: <5 mg/dL — ABNORMAL LOW (ref 8–23)
CO2: 26 mmol/L (ref 22–32)
Calcium: 8.3 mg/dL — ABNORMAL LOW (ref 8.9–10.3)
Chloride: 107 mmol/L (ref 98–111)
Creatinine, Ser: 0.76 mg/dL (ref 0.44–1.00)
GFR, Estimated: >60 mL/min (ref >60)
Glucose, Bld: 92 mg/dL (ref 70–99)
Potassium: 3.8 mmol/L (ref 3.5–5.1)
Sodium: 140 mmol/L (ref 135–145)
Total Bilirubin: 0.8 mg/dL (ref 0.3–1.2)
Total Protein: 4.9 g/dL — ABNORMAL LOW (ref 6.5–8.1)

## 2022-12-04 LAB — GLUCOSE, CAPILLARY
Glucose-Capillary: 87 mg/dL (ref 70–99)
Glucose-Capillary: 76 mg/dL (ref 70–99)
Glucose-Capillary: 89 mg/dL (ref 70–99)
Glucose-Capillary: 91 mg/dL (ref 70–99)
Glucose-Capillary: 95 mg/dL (ref 70–99)

## 2022-12-04 LAB — URINE CULTURE: Culture: >=100000 — AB

## 2022-12-04 LAB — MAGNESIUM: Magnesium: 1.5 mg/dL — ABNORMAL LOW (ref 1.7–2.4)

## 2022-12-04 LAB — PHOSPHORUS: Phosphorus: 3.8 mg/dL (ref 2.5–4.6)

## 2022-12-04 MED ORDER — OSMOLITE 1.5 CAL PO LIQD
1000.0000 mL | ORAL | Status: DC
  Administered 2022-12-04 – 2022-12-07 (×4): 1000 mL
  Filled 2022-12-04 (×8): qty 1000

## 2022-12-04 MED ORDER — MAGNESIUM OXIDE -MG SUPPLEMENT 400 (240 MG) MG PO TABS
800.0000 mg | ORAL_TABLET | Freq: Two times a day (BID) | ORAL | Status: DC
  Administered 2022-12-04 – 2022-12-15 (×21): 800 mg
  Filled 2022-12-04 (×21): qty 2

## 2022-12-04 MED ORDER — ADULT MULTIVITAMIN LIQUID CH
15.0000 mL | Freq: Every day | ORAL | Status: DC
  Administered 2022-12-04 – 2022-12-20 (×16): 15 mL
  Filled 2022-12-04 (×17): qty 15

## 2022-12-04 NOTE — Progress Notes (Addendum)
Progress Note  Primary GI: Dr. Loletha Carrow   Subjective  Chief Complaint: Dysphagia  Patient without family at bedside. IR G-tube yesterday. Per the nurse patient had some abdominal discomfort with water flush, planning on starting feedings today around 2. No nausea, vomiting, fevers or chills.    Objective   Vital signs in last 24 hours: Temp:  [97.9 F (36.6 C)-98.3 F (36.8 C)] 97.9 F (36.6 C) (01/23 9833) Pulse Rate:  [85-119] 88 (01/23 0613) Resp:  [11-23] 16 (01/22 2034) BP: (92-118)/(53-94) 92/65 (01/23 8250) SpO2:  [94 %-100 %] 96 % (01/23 5397) Last BM Date : 12/02/22 Last BM recorded by nurses in past 5 days No data recorded  General:  chronically ill appearing,  female in no acute distress,  Heart:  Regular rate and rhythm; no murmurs Pulm: Clear anteriorly; no wheezing Abdomen:  Soft, Non-distended AB, Sluggish bowel sounds. IR Gtube bandaged left AB, no discharge. Extremities:  without  edema, right arm with spastic hand and held tightly to her body with pain, left leg with decreased movement Neurologic:  Alert and  oriented x4;  hemiparesis left side with spacity, dysarthria Psych:  Cooperative. Normal mood and affect.  Intake/Output from previous day: 01/22 0701 - 01/23 0700 In: 1126.2 [I.V.:1046.2; NG/GT:80] Out: 400 [Urine:400] Intake/Output this shift: No intake/output data recorded.  Studies/Results: IR GASTROSTOMY TUBE MOD SED  Result Date: 12/03/2022 INDICATION: Dysphagia. EXAM: PERCUTANEOUS GASTROSTOMY TUBE PLACEMENT COMPARISON:  CT AP, 11/30/2022. MEDICATIONS: Ancef 2 gm IV; Antibiotics were administered within 1 hour of the procedure. Glucagon 0.5 mg IV. CONTRAST:  20 mL of Isovue 300 administered into the gastric lumen. ANESTHESIA/SEDATION: Moderate (conscious) sedation was employed during this procedure. A total of Versed 2 mg and Fentanyl 100 mcg was administered intravenously. Moderate Sedation Time: 12 minutes. The patient's level of  consciousness and vital signs were monitored continuously by radiology nursing throughout the procedure under my direct supervision. FLUOROSCOPY TIME:  Fluoroscopic dose; 1 mGy COMPLICATIONS: None immediate. PROCEDURE: Informed written consent was obtained from the patient and/or patient's representative following explanation of the procedure, risks, benefits and alternatives. A time out was performed prior to the initiation of the procedure. Maximal barrier sterile technique utilized including caps, mask, sterile gowns, sterile gloves, large sterile drape, hand hygiene and Betadine prep. The LEFT upper quadrant was sterilely prepped and draped. A oral gastric catheter was inserted into the stomach under fluoroscopy. The existing nasogastric feeding tube was removed. The left costal margin and barium opacified transverse colon were identified and avoided. Air was injected into the stomach for insufflation and visualization under fluoroscopy. Under sterile conditions and local anesthesia, 2 T tacks were utilized to pexy the anterior aspect of the stomach against the ventral abdominal wall. Contrast injection confirmed appropriate positioning of each of the T tacks. An incision was made between the T tacks and a 17 gauge trocar needle was utilized to access the stomach. Needle position was confirmed within the stomach with aspiration of air and injection of a small amount of contrast. A stiff Glidewire was advanced into the gastric lumen and under intermittent fluoroscopic guidance and a telescoping peel-away sheath was inserted, ultimately allowing placement of a 18 Fr balloon retention gastrostomy tube. The retention balloon was insufflated with a mixture of dilute saline and contrast and pulled taut against the anterior wall of the stomach. The external disc was cinched. Contrast injection confirms positioning within the stomach. Several spot radiographic images were obtained in various obliquities for  documentation. T  he patient tolerated procedure well without immediate post procedural complication. FINDINGS: After successful fluoroscopic guided placement, the gastrostomy tube is appropriately positioned with internal retention balloon against the ventral aspect of the gastric lumen. IMPRESSION: Successful fluoroscopic insertion of an 18 Fr balloon retention gastrostomy tube. PLAN: *The gastrostomy may be used for medication administration within 6 hours of placement, and in 24 hrs for the initiation of feeds. * The patient will return to [facility] ([facility]) for routine feeding tube evaluation and exchange in 6 months. Michaelle Birks, MD [facility] Electronically Signed   By: Michaelle Birks M.D.   On: 12/03/2022 18:06    Lab Results: No results for input(s): "WBC", "HGB", "HCT", "PLT" in the last 72 hours. BMET Recent Labs    12/02/22 0856 12/03/22 1122 12/04/22 0651  NA 139 141 140  K 4.4 4.0 3.8  CL 108 108 107  CO2 '24 27 26  '$ GLUCOSE 103* 102* 92  BUN <5* <5* <5*  CREATININE 0.69 0.73 0.76  CALCIUM 8.1* 8.5* 8.3*    Scheduled Meds:  atorvastatin  80 mg Per Tube QPC supper   diclofenac Sodium  4 g Topical QID   ezetimibe  10 mg Per Tube Daily   free water  100 mL Per Tube Q4H   magnesium oxide  800 mg Per Tube BID   melatonin  3 mg Per Tube QHS   pantoprazole (PROTONIX) IV  40 mg Intravenous Q12H   polyethylene glycol  17 g Per Tube Daily   thiamine  100 mg Per Tube Daily   tiZANidine  2 mg Per Tube BID   Continuous Infusions:  sodium chloride 50 mL/hr at 12/04/22 1003   feeding supplement (OSMOLITE 1.5 CAL) Stopped (12/02/22 1900)      Patient profile:   67 year old female status post ischemic stroke 07/2022 left hemiplegia continuing dysphagia and dysarthria presenting with failure to thrive and dysphagia.  Previous distal esophageal stricture dilated 2021-15.5, 07/27/2022 pharyngeal dysphagia  bedside swallow aspiration   Impression/Plan:   Dysphagia with significant history of ischemic stroke Patient has previous history of esophageal stricture last dilated 2021 11/30/2022 barium swallow grossly nondiagnostic evaluation with minimal barium consumed delaying oral phase of swallowing suggestion of transient laryngeal penetration no gross tracheal aspiration  IR G-tube placement yesterday Had some AB pain with water flush, going to start feeding this AM Continue protonix 40 mg BID BID Speech pathology evaluation shows primary oral dysphagia and significant dysarthria [facility] will sign off.   Failure to thrive/severe protein calorie malnutrition BMI body mass index is 18.68 kg/m.  IR G-tube, RD involved  Ischemic stroke 07/2022 with dysarthria MRI negative   LOS: 3 days   Vladimir Crofts  12/04/2022, 10:09 AM   Attending Physician Note   I have taken an interval history, reviewed the chart and examined the patient. I performed a substantive portion of this encounter, including complete performance of at least one of the key components, in conjunction with the APP. I agree with the APP's note, impression and recommendations with my edits. My additional impressions and recommendations are as follows.   Speech path evaluation: primary oral dysphagia, significant dysarthria. Limited barium esophagram showed a normal caliber esophagus without gross esophageal abnormality. S/P IR G-tube yesterday.   RD to recommend tube feedings.  No plan for EGD at this time.  Continue pantoprazole 40 mg bid.  Outpatient GI follow up with Dr. Loletha Carrow as needed.  GI signing off.  Lucio Edward, MD [facility] See AMION, [facility], for our on call provider

## 2022-12-04 NOTE — Progress Notes (Signed)
Nutrition Follow-up  DOCUMENTATION CODES:   Severe malnutrition in context of chronic illness  INTERVENTION:   Monitor magnesium, potassium, and phosphorus BID for at least 3 days, MD to replete as needed, as pt is at risk for refeeding syndrome.  Per IR, PEG can be used for feeding at 1600: -Initiate Osmolite 1.5 @ 20 ml/hr, advance by 10 ml every 12 hours to goal rate of 55 ml/hr. -Free water: 100 ml every 4 hours (600 ml) -Provides 1980 kcals, 82g protein and 1605 ml H2O  -Multivitamin with minerals daily via tube -Continue Thiamine given refeeding risk  NUTRITION DIAGNOSIS:   Severe Malnutrition related to chronic illness, dysphagia (h/o CVA) as evidenced by moderate fat depletion, severe muscle depletion, energy intake < or equal to 75% for > or equal to 1 month.  Ongoing.  GOAL:   Patient will meet greater than or equal to 90% of their needs  Not meeting yet.  MONITOR:   PO intake, Supplement acceptance, Labs, Weight trends, I & O's   ASSESSMENT:   67 year old African-American female history of multiple strokes, dense left hemiparesis, hypertension who presents to the ER today with worsening dysphagia.  PEG was placed by IR 1/22. Per IR order, tube can be used for feeds starting at 1600 today. Have updated orders and added daily MVI given refeeding risk.   SLP evaluated pt today, recommended clear liquids. May need MBS.  Admission weight: 112 lbs Current weight: 115 lbs  Medications: MAG-OX, Miralax, Thiamine  Labs reviewed:  CBGs: 71-103 Low Mg   Diet Order:   Diet Order             Diet clear liquid Room service appropriate? Yes; Fluid consistency: Thin  Diet effective now                   EDUCATION NEEDS:   Education needs have been addressed  Skin:  Skin Assessment: Reviewed RN Assessment  Last BM:  1/21  Height:   Ht Readings from Last 1 Encounters:  11/30/22 '5\' 6"'$  (1.676 m)    Weight:   Wt Readings from Last 1 Encounters:   12/03/22 52.5 kg    BMI:  Body mass index is 18.68 kg/m.  Estimated Nutritional Needs:   Kcal:  1610-9604  Protein:  80-95g  Fluid:  1.8L/day   Clayton Bibles, MS, RD, LDN Inpatient Clinical Dietitian Contact information available via Amion

## 2022-12-04 NOTE — Progress Notes (Addendum)
PROGRESS NOTE   Kara Bailey  EGB:[id number] DOB: 03-21-1956 DOA: 11/29/2022 PCP: Andree Moro, DO  Brief Narrative:  67 year old black female Prior CVA, HTN prior epigastric hernia repair, iliac stent placement Dr. Alvester Chou New CVA recently 07/2022 placed on aspirin Plavix Prior hep C status posttreatment Was placed on dysphagia diet at that time-has a history of reflux disease with esophagitis and has had dilatation in the past back in 07/2019 when she had balloon dilatation  She has become intolerant of swallowing any pills and can only tolerate small amounts of sips of fluid Presented to ED at request of her PCP Felt to have multifactorial dysphagia, DG esophagus showed delay in oral phase placed on IV fluids  Hospital-Problem based course  Multifactorial dysphagia secondary to stroke and probable stricturing - PEG tube to be placed today - Speech therapy requested to follow-up today as no further procedures - On admission DG esophagus showed nondiagnostic eval with delayed oral phase, continue PPI - Keep on saline but cut back rate to 50 cc/H - Watch for refeeding syndrome  Risk of refeeding syndrome - Defer to dietitian watch labs - Replacing magnesium with Mag-Ox 800 twice daily  Dense left hemiplegia since September 2023 - Resume Plavix 75 on discharge - Secondary prevention meds include aspirin 81 atorvastatin 80 and Zetia 10 - Should resume baclofen 5 every 8 as well as Zanaflex 2 twice daily for dense left-sided hemiplegia and spasticity  HTN - Blood pressures are low normal not requiring Cardizem to 40 Monitor  Depression - Celexa to be resumed as outpatient if felt necessary, holding Xanax at this time   DVT prophylaxis:  Code Status: full Family Communication: spoke with Kennyth Lose [phone] on 1/22 Disposition:  Status is: Inpatient Remains inpatient appropriate because:   PEG has been placed last graduate to full feeds and tolerated the same pain  needs to be controlled-will eventually need skilled placement  Subjective:  Pain was pretty severe postprocedure last BM but is a little better now She does not have any chest pain or chills We are awaiting speech input Tube feeds have not been started yet  Objective: Vitals:   12/03/22 1722 12/03/22 1726 12/03/22 2034 12/04/22 0613  BP: (!) 106/94  118/61 92/65  Pulse:  99 91 88  Resp:   16   Temp: 98.1 F (36.7 C)  98.1 F (36.7 C) 97.9 F (36.6 C)  TempSrc: Oral  Oral Oral  SpO2:  95% 94% 96%  Weight:      Height:        Intake/Output Summary (Last 24 hours) at 12/04/2022 0831 Last data filed at 12/04/2022 0336 Gross per 24 hour  Intake 1126.17 ml  Output 400 ml  Net 726.17 ml    Filed Weights   11/30/22 1602 12/02/22 0500 12/03/22 0332  Weight: 51.2 kg 51.6 kg 52.5 kg    Examination:  Awake coherent Dense left-sided weakness Abdomen soft site of PEG tube seems okay Chest is clear S1-S2 no murmur   Data Reviewed: personally reviewed   CBC    Component Value Date/Time   WBC 8.0 11/29/2022 2100   RBC 4.10 11/29/2022 2100   HGB 11.7 (L) 11/29/2022 2100   HGB 14.5 03/09/2022 0958   HCT 36.8 11/29/2022 2100   HCT 43.3 03/09/2022 0958   PLT 215 11/29/2022 2100   PLT 263 03/09/2022 0958   MCV 89.8 11/29/2022 2100   MCV 89 03/09/2022 0958   MCH 28.5 11/29/2022 2100   MCHC  31.8 11/29/2022 2100   RDW 16.7 (H) 11/29/2022 2100   RDW 13.4 03/09/2022 0958   LYMPHSABS 2.5 11/29/2022 2100   LYMPHSABS 2.3 08/14/2017 1610   MONOABS 0.7 11/29/2022 2100   EOSABS 0.6 (H) 11/29/2022 2100   EOSABS 0.1 08/14/2017 1610   BASOSABS 0.0 11/29/2022 2100   BASOSABS 0.0 08/14/2017 1610      Latest Ref Rng & Units 12/04/2022    6:51 AM 12/03/2022   11:22 AM 12/02/2022    8:56 AM  CMP  Glucose 70 - 99 mg/dL 92  102  103   BUN 8 - 23 mg/dL <5  <5  <5   Creatinine 0.44 - 1.00 mg/dL 0.76  0.73  0.69   Sodium 135 - 145 mmol/L 140  141  139   Potassium 3.5 - 5.1 mmol/L 3.8   4.0  4.4   Chloride 98 - 111 mmol/L 107  108  108   CO2 22 - 32 mmol/L '26  27  24   '$ Calcium 8.9 - 10.3 mg/dL 8.3  8.5  8.1   Total Protein 6.5 - 8.1 g/dL 4.9     Total Bilirubin 0.3 - 1.2 mg/dL 0.8     Alkaline Phos 38 - 126 U/L 62     AST 15 - 41 U/L 25     ALT 0 - 44 U/L 15        Radiology Studies: IR GASTROSTOMY TUBE MOD SED  Result Date: 12/03/2022 INDICATION: Dysphagia. EXAM: PERCUTANEOUS GASTROSTOMY TUBE PLACEMENT COMPARISON:  CT [facility], 11/30/2022. MEDICATIONS: Ancef 2 gm IV; Antibiotics were administered within 1 hour of the procedure. Glucagon 0.5 mg IV. CONTRAST:  20 mL of Isovue 300 administered into the gastric lumen. ANESTHESIA/SEDATION: Moderate (conscious) sedation was employed during this procedure. A total of Versed 2 mg and Fentanyl 100 mcg was administered intravenously. Moderate Sedation Time: 12 minutes. The patient's level of consciousness and vital signs were monitored continuously by radiology nursing throughout the procedure under my direct supervision. FLUOROSCOPY TIME:  Fluoroscopic dose; 1 mGy COMPLICATIONS: None immediate. PROCEDURE: Informed written consent was obtained from the patient and/or patient's representative following explanation of the procedure, risks, benefits and alternatives. A time out was performed prior to the initiation of the procedure. Maximal barrier sterile technique utilized including caps, mask, sterile gowns, sterile gloves, large sterile drape, hand hygiene and Betadine prep. The LEFT upper quadrant was sterilely prepped and draped. A oral gastric catheter was inserted into the stomach under fluoroscopy. The existing nasogastric feeding tube was removed. The left costal margin and barium opacified transverse colon were identified and avoided. Air was injected into the stomach for insufflation and visualization under fluoroscopy. Under sterile conditions and local anesthesia, 2 T tacks were utilized to pexy the anterior aspect of the stomach  against the ventral abdominal wall. Contrast injection confirmed appropriate positioning of each of the T tacks. An incision was made between the T tacks and a 17 gauge trocar needle was utilized to access the stomach. Needle position was confirmed within the stomach with aspiration of air and injection of a small amount of contrast. A stiff Glidewire was advanced into the gastric lumen and under intermittent fluoroscopic guidance and a telescoping peel-away sheath was inserted, ultimately allowing placement of a 18 Fr balloon retention gastrostomy tube. The retention balloon was insufflated with a mixture of dilute saline and contrast and pulled taut against the anterior wall of the stomach. The external disc was cinched. Contrast injection confirms positioning within the stomach.  Several spot radiographic images were obtained in various obliquities for documentation. T he patient tolerated procedure well without immediate post procedural complication. FINDINGS: After successful fluoroscopic guided placement, the gastrostomy tube is appropriately positioned with internal retention balloon against the ventral aspect of the gastric lumen. IMPRESSION: Successful fluoroscopic insertion of an 18 Fr balloon retention gastrostomy tube. PLAN: *The gastrostomy may be used for medication administration within 6 hours of placement, and in 24 hrs for the initiation of feeds. * The patient will return to Vascular Interventional Radiology (VIR) for routine feeding tube evaluation and exchange in 6 months. Michaelle Birks, MD [facility] Radiology Electronically Signed   By: Michaelle Birks M.D.   On: 12/03/2022 18:06     Scheduled Meds:  atorvastatin  80 mg Per Tube QPC supper   diclofenac Sodium  4 g Topical QID   ezetimibe  10 mg Per Tube Daily   free water  100 mL Per Tube Q4H   magnesium oxide  800 mg Per Tube BID   melatonin  3 mg Per Tube QHS   pantoprazole (PROTONIX) IV   40 mg Intravenous Q12H   polyethylene glycol  17 g Per Tube Daily   thiamine  100 mg Per Tube Daily   tiZANidine  2 mg Per Tube BID   Continuous Infusions:  sodium chloride 75 mL/hr at 12/04/22 0814   feeding supplement (OSMOLITE 1.5 CAL) Stopped (12/02/22 1900)     LOS: 3 days   Time spent: [place], MD [facility] To contact the attending provider between 7A-7P or the covering provider during after hours 7P-7A, please log into the web site [email or web address] and access using universal [place] password for that web site. If you do not have the password, please call the hospital operator.  12/04/2022, 8:31 AM

## 2022-12-04 NOTE — Evaluation (Signed)
Clinical/Bedside Swallow Evaluation Patient Details  Name: Kara Bailey MRN: [id number] Date of Birth: 1955-12-28  Today's Date: 12/04/2022 Time: SLP Start Time (ACUTE ONLY): 67 SLP Stop Time (ACUTE ONLY): 1107 SLP Time Calculation (min) (ACUTE ONLY): 21 min  Past Medical History:  Past Medical History:  Diagnosis Date   Acute CVA (cerebrovascular accident) ([place]) 12/29/2020   Anxiety    Chest pain 08/2014   unspecified   Cirrhosis ([place])    CVA (cerebral vascular accident) ([place]) 07/27/2022   Depression    Dizziness 08/2014   Dyspnea 08/2014   Dysrhythmia    Emphysema lung (HCC)    GERD (gastroesophageal reflux disease)    Heart murmur    Hepatitis C    Hiatal hernia    Hypertension    Ischemic stroke ([place]) 08/01/2022   PAC (premature atrial contraction) 10/11/2014   PAD (peripheral artery disease) (HCC)    Palpitations    PVC (premature ventricular contraction) 10/11/2014   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   Stroke ([place])    just slurred speech   Tachycardia 08/2014   Ventral hernia    Weakness 08/2014   Past Surgical History:  Past Surgical History:  Procedure Laterality Date   ABDOMINAL AORTOGRAM W/LOWER EXTREMITY N/A 07/13/2021   Procedure: ABDOMINAL AORTOGRAM W/LOWER EXTREMITY;  Surgeon: Lorretta Harp, MD;  Location: [place] [facility];  Service: Cardiovascular;  Laterality: N/A;   ABDOMINAL AORTOGRAM W/LOWER EXTREMITY N/A 03/19/2022   Procedure: ABDOMINAL AORTOGRAM W/LOWER EXTREMITY;  Surgeon: Lorretta Harp, MD;  Location: [place] [facility];  Service: Cardiovascular;  Laterality: N/A;   APPENDECTOMY     CATARACT EXTRACTION, BILATERAL     COLONOSCOPY     COLONOSCOPY WITH PROPOFOL N/A 04/30/2016   Procedure: COLONOSCOPY WITH PROPOFOL;  Surgeon: Daneil Dolin, MD;  [facility];  Service: Endoscopy;  Laterality: N/A;  [place] N/A 10/13/2021   Procedure: OPEN REPAIR EPIGASTRIC HERNIA WITH MESH PATCH;   Surgeon: Armandina Gemma, MD;  Location: [facility];  Service: General;  Laterality: N/A;   ESOPHAGOGASTRODUODENOSCOPY     approximately 2010   ESOPHAGOGASTRODUODENOSCOPY (EGD) WITH PROPOFOL N/A 04/30/2016   Procedure: ESOPHAGOGASTRODUODENOSCOPY (EGD) WITH PROPOFOL;  Surgeon: Daneil Dolin, MD;  [facility];  Service: Endoscopy;  Laterality: N/A;   IR GASTROSTOMY TUBE MOD SED  12/03/2022   LEFT HEART CATHETERIZATION WITH CORONARY ANGIOGRAM N/A 10/29/2014   07-14-20- pt denies this Procedure: LEFT HEART CATHETERIZATION WITH CORONARY ANGIOGRAM;  Surgeon: Burnell Blanks, MD;  Location: [facility] C[facility] LAB;  Service: Cardiovascular;  Laterality: N/A;   MALONEY DILATION N/A 04/30/2016   Procedure: Venia Minks DILATION;  Surgeon: Daneil Dolin, MD;  [facility];  Service: Endoscopy;  Laterality: N/A;   PERIPHERAL VASCULAR INTERVENTION  07/13/2021   Procedure: PERIPHERAL VASCULAR INTERVENTION;  Surgeon: Lorretta Harp, MD;  Location: [place] [facility];  Service: Cardiovascular;;  left SFA left external iliac   PERIPHERAL VASCULAR INTERVENTION Right 03/19/2022   Procedure: PERIPHERAL VASCULAR INTERVENTION;  Surgeon: Lorretta Harp, MD;  Location: [place] [facility];  Service: Cardiovascular;  Laterality: Right;  SFA   POLYPECTOMY  04/30/2016   Procedure: POLYPECTOMY;  Surgeon: Daneil Dolin, MD;  [facility];  Service: Endoscopy;;  Sigmoid colon polyp removed via hot snare   UPPER GASTROINTESTINAL ENDOSCOPY     HPI:  Kara Bailey is a 67 y.o. female who presented to [facility] ED on 11/29/22 with worsening dysphagia  c/b inability to swallow pills or solid foods. She was sent to ED by PCP for PEG placement (PEG 12/03/22). PMHx includes CVAs, dysarthria, HTN, prior epigastric hernia repair, iliac stent. Pt has a long hx of dysphagia and has worked with SLP in acute, inpatient, and OP settings over two years. She has had multiple MBS studies. Dysphagia is neurogenic in nature and related to  advanced chronic SVD throughout deep gray nuclei, deep white matter capsules, and brainstem as well as prior right corona radiata and lentiform infarcts.  Pt's dysphagia has been primarily oral (difficulty with onset of oral phase and oral control) but pharyngeal function has been largely intact with good bolus clearance and adequate airway protection. Review of 11/30/22 esophagram reveals similar mechanics of swallow. 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    Assessment / Plan / Recommendation  Clinical Impression  Pt presents with long history of a primary oral dysphagia and significant dysarthria.  Oral mechanism assessment today revealed reduced bilateral mobility of oral structures, restricted tongue extension, weak voice with poor respiratory support.  Pt only willing to accept thin liquids during assessment. Oral manipulation continued to be difficult with prolonged period of time before onset of swallow could be palpated, however, once pharyngeal swallow presumably occurred, there were no s/s of aspiration. Recommend that pt start a clear liquid diet; she may benefit from an MBS while admitted to determine physiology of oral and pharyngeal phases. SLP will follow. SLP Visit Diagnosis: Dysphagia, oral phase (R13.11)           Diet Recommendation   Clear liquid diet  Medication Administration: Via alternative means    Other  Recommendations Oral Care Recommendations: Oral care QID    Recommendations for follow up therapy are one component of a multi-disciplinary discharge planning process, led by the attending physician.  Recommendations may be updated based on patient status, additional functional criteria and insurance authorization.  Follow up Recommendations   tba       Swallow Study   General HPI: Kara Bailey is a 67 y.o. female who presented to [facility] ED on 11/29/22 with worsening dysphagia c/b inability to swallow pills or solid foods.  She was sent to ED by PCP for PEG placement (PEG 12/03/22). PMHx includes CVAs, dysarthria, HTN, prior epigastric hernia repair, iliac stent. Pt has a long hx of dysphagia and has worked with SLP in acute, inpatient, and OP settings over two years. She has had multiple MBS studies. 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  Pt's dysphagia has been primarily oral (difficulty with onset of oral phase and oral control) but pharyngeal function has been largely intact with good bolus clearance and adequate airway protection. Review of 11/30/22 esophagram reveals similar mechanics of swallow. 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 Type of Study: Bedside Swallow Evaluation Previous Swallow Assessment: see HPI Diet Prior to this Study: NPO Temperature Spikes Noted: No Respiratory Status: Room air History of Recent Intubation: No Behavior/Cognition: Alert;Cooperative Oral Cavity Assessment: Within Functional Limits Oral Care Completed by SLP: Recent completion by staff Oral Cavity - Dentition: Edentulous Self-Feeding Abilities: Able to feed self;Needs assist Patient Positioning: Upright in bed Baseline Vocal Quality: Breathy;Hoarse;Low vocal intensity Volitional Cough: Weak Volitional Swallow: Able to elicit    Oral/Motor/Sensory Function Overall Oral Motor/Sensory Function: Moderate impairment Facial  ROM: Reduced right;Reduced left Lingual ROM: Reduced right;Reduced left;Suspected CN XII (hypoglossal) dysfunction Lingual Symmetry: Suspected CN XII (hypoglossal) dysfunction (poor extension) Lingual Strength: Reduced;Suspected CN XII (hypoglossal) dysfunction   Ice Chips Ice chips: Impaired Presentation: Spoon Oral Phase Functional Implications: Prolonged oral transit   Thin Liquid Thin Liquid:  Impaired Presentation: Spoon;Straw Oral Phase Functional Implications: Prolonged oral transit    Nectar Thick Nectar Thick Liquid: Not tested   Honey Thick Honey Thick Liquid: Not tested   Puree Puree: Not tested   Solid     Solid: Not tested      Juan Quam Laurice 12/04/2022,11:25 AM  Estill Bamberg L. Tivis Ringer, MA CCC/SLP Clinical Specialist - [place] Office number [phone]

## 2022-12-04 NOTE — Evaluation (Signed)
Occupational Therapy Evaluation Patient Details Name: Kara Bailey MRN: [id number] DOB: May 05, 1956 Today's Date: 12/04/2022   History of Present Illness Patient is 67 year old female admitted 11/29/22 with multifactorial dysphagia with plan for PEG tube.  Pt with hx of CVA with L hemiparesis, HTN, iliac stent, hep C, GERD   Clinical Impression   Patient is a 67 year old female who was admitted for above. Patient was living at home with family prior level. Currently, patient is mod A to transfer with TD for LB Dressing, increased pain in LUE, and decreased functional activity tolerance. Patient would continue to benefit from skilled OT services at this time while admitted and after d/c to address noted deficits in order to improve overall safety and independence in ADLs.        Recommendations for follow up therapy are one component of a multi-disciplinary discharge planning process, led by the attending physician.  Recommendations may be updated based on patient status, additional functional criteria and insurance authorization.   Follow Up Recommendations  Skilled nursing-short term rehab (<3 hours/day)     Assistance Recommended at Discharge Frequent or constant Supervision/Assistance  Patient can return home with the following Two people to help with walking and/or transfers;A lot of help with bathing/dressing/bathroom;Assistance with cooking/housework;Direct supervision/assist for medications management;Assist for transportation;Help with stairs or ramp for entrance;Direct supervision/assist for financial management    Functional Status Assessment  Patient has had a recent decline in their functional status and demonstrates the ability to make significant improvements in function in a reasonable and predictable amount of time.  Equipment Recommendations  None recommended by OT    Recommendations for Other Services       Precautions / Restrictions Precautions Precautions:  Fall Restrictions Weight Bearing Restrictions: No      Mobility Bed Mobility Overal bed mobility: Needs Assistance Bed Mobility: Supine to Sit     Supine to sit: Min assist     General bed mobility comments: with increased time        Balance Overall balance assessment: Needs assistance Sitting-balance support: No upper extremity supported Sitting balance-Leahy Scale: Fair     Standing balance support: Single extremity supported Standing balance-Leahy Scale: Poor         ADL either performed or assessed with clinical judgement   ADL Overall ADL's : Needs assistance/impaired   Eating/Feeding Details (indicate cue type and reason): not observed   Grooming Details (indicate cue type and reason): patient declined to attempt Upper Body Bathing: Maximal assistance;Bed level Upper Body Bathing Details (indicate cue type and reason): patient indicated she already did this earlier on this date. Lower Body Bathing: Total assistance;Bed level   Upper Body Dressing : Bed level;Maximal assistance   Lower Body Dressing: Total assistance;Bed level     Toilet Transfer Details (indicate cue type and reason): patient was mod A for stand pivot transfer from edge of bed to recliner in room with increased time. patient was able to indicate which side was easier for her to transfer to. Toileting- Clothing Manipulation and Hygiene: Total assistance;Bed level                Pertinent Vitals/Pain Pain Assessment Pain Assessment: No/denies pain     Hand Dominance Right   Extremity/Trunk Assessment Upper Extremity Assessment Upper Extremity Assessment: RUE deficits/detail RUE Deficits / Details: Grossly WFL LUE Deficits / Details: Contracted unable to tolerate attempt to ROM on L elbow with reports of pain with touch. patient also noted to  have fisted posture of hand. patient reported "splint had been ordered" but did not elaborate. patient noted to have long nails on fisted  side with increased risk of skin breakdown with current positioning. LUE: Unable to fully assess due to pain LUE Coordination: decreased fine motor;decreased gross motor   Lower Extremity Assessment Lower Extremity Assessment: Defer to PT evaluation   Cervical / Trunk Assessment Cervical / Trunk Assessment: Normal   Communication Communication Communication: Expressive difficulties (minimal verbalizations. able to indicate what she wants. notes she writes to communicate as well.)   Cognition Arousal/Alertness: Awake/alert Behavior During Therapy: WFL for tasks assessed/performed Overall Cognitive Status: Within Functional Limits for tasks assessed       General Comments: patient is able to use minimal verbalizations and communications to get her point across. patient noted to use writting to communicate more intricate details.                Home Living Family/patient expects to be discharged to:: Private residence Living Arrangements: Children Available Help at Discharge: Family;Available 24 hours/day Type of Home: House Home Access: Level entry     Home Layout: Two level;Able to live on main level with bedroom/bathroom     Bathroom Shower/Tub: Teacher, early years/pre: Handicapped height Bathroom Accessibility: Yes How Accessible: Accessible via walker Home Equipment: Wheelchair - manual;Hospital bed;BSC/3in1          Prior Functioning/Environment Prior Level of Function : Needs assist             Mobility Comments: Pt reports performing stand pivot transfers to w/c with assist from daughter.  She was able to mobilize in w/c with R side.  Reports was getting HHPT and working on walking ADLs Comments: Requiring assist for ADLs        OT Problem List: Decreased activity tolerance;Impaired balance (sitting and/or standing);Decreased coordination;Decreased knowledge of precautions;Impaired UE functional use;Pain;Decreased knowledge of use of DME or  AE;Decreased strength;Decreased range of motion      OT Treatment/Interventions: Self-care/ADL training;Energy conservation;Therapeutic exercise;DME and/or AE instruction;Therapeutic activities;Patient/family education;Balance training    OT Goals(Current goals can be found in the care plan section) Acute Rehab OT Goals Patient Stated Goal: to go to rehab OT Goal Formulation: With patient Time For Goal Achievement: 12/18/22 Potential to Achieve Goals: Fair  OT Frequency: Min 2X/week       AM-PAC OT "6 Clicks" Daily Activity     Outcome Measure Help from another person eating meals?: A Little Help from another person taking care of personal grooming?: A Little Help from another person toileting, which includes using toliet, bedpan, or urinal?: A Lot Help from another person bathing (including washing, rinsing, drying)?: A Lot Help from another person to put on and taking off regular upper body clothing?: A Lot Help from another person to put on and taking off regular lower body clothing?: A Lot 6 Click Score: 14   End of Session Equipment Utilized During Treatment: Gait belt Nurse Communication: Mobility status  Activity Tolerance: Patient tolerated treatment well Patient left: in chair;with call bell/phone within reach;with chair alarm set  OT Visit Diagnosis: Unsteadiness on feet (R26.81);Other abnormalities of gait and mobility (R26.89);Muscle weakness (generalized) (M62.81)                Time: 3016-0109 OT Time Calculation (min): 19 min Charges:  OT General Charges $OT Visit: 1 Visit OT Evaluation $OT Eval Moderate Complexity: 1 Mod  Yides Saidi OTR/L, MS Acute Rehabilitation Department Office# [phone]  Willa Rough 12/04/2022, 4:04 PM

## 2022-12-04 NOTE — Progress Notes (Signed)
Patient ID: Kara Bailey, female   DOB: [birth year], 67 y.o.   MRN: [id number] Pt s/p perc gastrostomy tube yesterday; site appears stable, NT; gauze bandage in place; ok to use G tube as needed

## 2022-12-05 ENCOUNTER — Inpatient Hospital Stay (HOSPITAL_COMMUNITY): Payer: Medicare HMO

## 2022-12-05 DIAGNOSIS — I69391 Dysphagia following cerebral infarction: Secondary | ICD-10-CM | POA: Diagnosis not present

## 2022-12-05 LAB — COMPREHENSIVE METABOLIC PANEL
ALT: 12 U/L (ref 0–44)
AST: 21 U/L (ref 15–41)
Albumin: 2.3 g/dL — ABNORMAL LOW (ref 3.5–5.0)
Alkaline Phosphatase: 62 U/L (ref 38–126)
Anion gap: 7 (ref 5–15)
BUN: <5 mg/dL — ABNORMAL LOW (ref 8–23)
CO2: 25 mmol/L (ref 22–32)
Calcium: 8.1 mg/dL — ABNORMAL LOW (ref 8.9–10.3)
Chloride: 105 mmol/L (ref 98–111)
Creatinine, Ser: 0.72 mg/dL (ref 0.44–1.00)
GFR, Estimated: >60 mL/min (ref >60)
Glucose, Bld: 97 mg/dL (ref 70–99)
Potassium: 3.6 mmol/L (ref 3.5–5.1)
Sodium: 137 mmol/L (ref 135–145)
Total Bilirubin: 0.4 mg/dL (ref 0.3–1.2)
Total Protein: 4.8 g/dL — ABNORMAL LOW (ref 6.5–8.1)

## 2022-12-05 LAB — MAGNESIUM: Magnesium: 1.6 mg/dL — ABNORMAL LOW (ref 1.7–2.4)

## 2022-12-05 LAB — GLUCOSE, CAPILLARY
Glucose-Capillary: 117 mg/dL — ABNORMAL HIGH (ref 70–99)
Glucose-Capillary: 120 mg/dL — ABNORMAL HIGH (ref 70–99)
Glucose-Capillary: 119 mg/dL — ABNORMAL HIGH (ref 70–99)
Glucose-Capillary: 108 mg/dL — ABNORMAL HIGH (ref 70–99)

## 2022-12-05 LAB — PHOSPHORUS: Phosphorus: 2.9 mg/dL (ref 2.5–4.6)

## 2022-12-05 MED ORDER — ENSURE ENLIVE PO LIQD
237.0000 mL | Freq: Two times a day (BID) | ORAL | Status: DC
  Administered 2022-12-06 – 2022-12-07 (×3): 237 mL via ORAL

## 2022-12-05 NOTE — Progress Notes (Signed)
Physical Therapy Treatment Patient Details Name: Kara Bailey MRN: [id number] DOB: 1956-10-24 Today's Date: 12/05/2022   History of Present Illness Patient is 67 year old female admitted 11/29/22 with multifactorial dysphagia with plan for PEG tube.  Pt with hx of CVA with L hemiparesis, HTN, iliac stent, hep C, GERD    PT Comments    Pt seen for PT tx with pt agreeable. Pt reports being soiled with urine & found to have purewick malfunction. Pt transitions to sitting EOB, electing to exit on R side, with hospital bed features & CGA. Pt engaged in multiple STS from EOB with max assist & PT providing cuing/facilitation for increased anterior weight shift to increase ease of transfer & reduce posterior lean. PT assisted with changing bed linens while pt demonstrated fair static sitting balance. Assisted pt with donning clean gown & pt with significant pain with LUE movement during process. Pt maintains L elbow, wrist & digit flexed throughout; pt not willing to let PT perform PROM so PT encouraged pt to perform ROM as tolerated. Pt reports she's eager to d/c to rehab facility with PT educating pt that's the current recommendation.    Recommendations for follow up therapy are one component of a multi-disciplinary discharge planning process, led by the attending physician.  Recommendations may be updated based on patient status, additional functional criteria and insurance authorization.  Follow Up Recommendations  Skilled nursing-short term rehab (<3 hours/day) Can patient physically be transported by private vehicle: No   Assistance Recommended at Discharge Frequent or constant Supervision/Assistance  Patient can return home with the following A little help with walking and/or transfers;A little help with bathing/dressing/bathroom;Assistance with cooking/housework;Help with stairs or ramp for entrance   Equipment Recommendations  None recommended by PT (TBD in next venue but would beneft from  hospital bed)    Recommendations for Other Services       Precautions / Restrictions Precautions Precautions: Fall Precaution Comments: L hemi Restrictions Weight Bearing Restrictions: No     Mobility  Bed Mobility Overal bed mobility: Needs Assistance Bed Mobility: Supine to Sit, Sit to Supine     Supine to sit: Min guard, HOB elevated (HOB elevated, pt exiting R side of bed, extra time to pull trunk upright & square up to sitting EOB) Sit to supine: Min assist, Mod assist, HOB elevated (assistance to elevate BLE onto bed)   General bed mobility comments: use of bed rails throughout    Transfers Overall transfer level: Needs assistance Equipment used: 1 person hand held assist Transfers: Sit to/from Stand Sit to Stand: Max assist           General transfer comment: STS x 3-4 times from EOB with PT providing assistance to power up to standing, cuing/faciliation for increased anterior shift to increase ease of transfer    Ambulation/Gait                   Stairs             Wheelchair Mobility    Modified Rankin (Stroke Patients Only)       Balance Overall balance assessment: Needs assistance Sitting-balance support: No upper extremity supported Sitting balance-Leahy Scale: Fair Sitting balance - Comments: Pt demonstrates decent sitting balance; pt engaged in reaching for PT's hands but pt reaching minimally outside of BOS to L. Pt endorses fear of falling with activity but no significant LOB observed.   Standing balance support: Single extremity supported Standing balance-Leahy Scale: Poor  Cognition Arousal/Alertness: Awake/alert Behavior During Therapy: WFL for tasks assessed/performed Overall Cognitive Status: Within Functional Limits for tasks assessed                                 General Comments: Pt attempts to verbally communicate throughout session with PT able to  understand what pt's stating, pt speaking at low volume though. Follows simple commands well throughout session.        Exercises      General Comments        Pertinent Vitals/Pain Pain Assessment Pain Assessment: Faces Faces Pain Scale: Hurts whole lot Pain Location: LUE with minimal PROM/movement Pain Descriptors / Indicators: Discomfort, Guarding, Grimacing Pain Intervention(s): Monitored during session, Limited activity within patient's tolerance, Repositioned    Home Living                          Prior Function            PT Goals (current goals can now be found in the care plan section) Acute Rehab PT Goals Patient Stated Goal: go to rehab PT Goal Formulation: With patient Time For Goal Achievement: 12/17/22 Potential to Achieve Goals: Good Progress towards PT goals: Progressing toward goals    Frequency    Min 2X/week      PT Plan Current plan remains appropriate    Co-evaluation              AM-PAC PT "6 Clicks" Mobility   Outcome Measure  Help needed turning from your back to your side while in a flat bed without using bedrails?: A Little Help needed moving from lying on your back to sitting on the side of a flat bed without using bedrails?: A Lot Help needed moving to and from a bed to a chair (including a wheelchair)?: A Lot Help needed standing up from a chair using your arms (e.g., wheelchair or bedside chair)?: A Lot Help needed to walk in hospital room?: Total   6 Click Score: 10    End of Session   Activity Tolerance: Patient tolerated treatment well Patient left: in bed;with call bell/phone within reach;with bed alarm set Nurse Communication: Mobility status PT Visit Diagnosis: Other abnormalities of gait and mobility (R26.89);Hemiplegia and hemiparesis;Difficulty in walking, not elsewhere classified (R26.2) Hemiplegia - Right/Left: Left Hemiplegia - dominant/non-dominant: Non-dominant Hemiplegia - caused by: Cerebral  infarction     Time: 1200-1221 PT Time Calculation (min) (ACUTE ONLY): 21 min  Charges:  $Therapeutic Activity: 8-22 mins                     Lavone Nian, PT, DPT 12/05/22, 12:41 PM   Waunita Schooner 12/05/2022, 12:39 PM

## 2022-12-05 NOTE — Progress Notes (Signed)
Nutrition Follow-up  DOCUMENTATION CODES:   Severe malnutrition in context of chronic illness  INTERVENTION:   Monitor magnesium, potassium, and phosphorus for at least 3 days, MD to replete as needed, as pt is at risk for refeeding syndrome.   -Continue Osmolite 1.5 @ 30 ml/hr, advance by 10 ml every 12 hours to goal rate of 55 ml/hr.  -Free water: 100 ml every 4 hours (600 ml) -Provides 1980 kcals, 82g protein and 1605 ml H2O   -Multivitamin with minerals daily via tube -Continue Thiamine given refeeding risk  -If diet is advanced to full liquids, will order Ensure Plus High Protein po BID, each supplement provides 350 kcal and 20 grams of protein.   NUTRITION DIAGNOSIS:   Severe Malnutrition related to chronic illness, dysphagia (h/o CVA) as evidenced by moderate fat depletion, severe muscle depletion, energy intake < or equal to 75% for > or equal to 1 month.  Ongoing.  GOAL:   Patient will meet greater than or equal to 90% of their needs  Progressing.  MONITOR:   PO intake, Supplement acceptance, Labs, Weight trends, I & O's  ASSESSMENT:   67 year old African-American female history of multiple strokes, dense left hemiparesis, hypertension who presents to the ER today with worsening dysphagia.  Patient in room, calm and was able to relay that she feels okay since starting tube feedings. States it has not hurt when they have given free water. Pt currently receiving Osmolite 1.5 @ 30 ml/hr and tolerating.  SLP evaluated, recommending possible advancement of diet to full liquids and could have Ensure. Will order if diet is changed.  Admission weight: 112 lbs Weight 1/22: 115 lbs Daily weights are ordered.  Medications: MAG-OX, MVI, Thiamine  Labs reviewed: CBGs:  76-120 Low Mg  Diet Order:   Diet Order             Diet clear liquid Room service appropriate? Yes; Fluid consistency: Thin  Diet effective now                   EDUCATION NEEDS:    Education needs have been addressed  Skin:  Skin Assessment: Reviewed RN Assessment  Last BM:  1/22  Height:   Ht Readings from Last 1 Encounters:  11/30/22 '5\' 6"'$  (1.676 m)    Weight:   Wt Readings from Last 1 Encounters:  12/03/22 52.5 kg    BMI:  Body mass index is 18.68 kg/m.  Estimated Nutritional Needs:   Kcal:  3295-1884  Protein:  80-95g  Fluid:  1.8L/day  Clayton Bibles, MS, RD, LDN Inpatient Clinical Dietitian Contact information available via Amion

## 2022-12-05 NOTE — Progress Notes (Signed)
Objective Swallowing Evaluation: Type of Study: Bedside Swallow Evaluation   Patient Details  Name: Kara Bailey MRN: [id number] Date of Birth: Jun 16, 1956  Today's Date: 12/05/2022 Time: SLP Start Time (ACUTE ONLY): 35 -SLP Stop Time (ACUTE ONLY): 6789  SLP Time Calculation (min) (ACUTE ONLY): 28 min   Past Medical History:  Past Medical History:  Diagnosis Date   Acute CVA (cerebrovascular accident) ([place]) 12/29/2020   Anxiety    Chest pain 08/2014   unspecified   Cirrhosis ([place])    CVA (cerebral vascular accident) ([place]) 07/27/2022   Depression    Dizziness 08/2014   Dyspnea 08/2014   Dysrhythmia    Emphysema lung ([facility])    GERD (gastroesophageal reflux disease)    Heart murmur    Hepatitis C    Hiatal hernia    Hypertension    Ischemic stroke ([place]) 08/01/2022   PAC (premature atrial contraction) 10/11/2014   PAD (peripheral artery disease) ([facility])    Palpitations    PVC (premature ventricular contraction) 10/11/2014   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   Stroke ([place])    just slurred speech   Tachycardia 08/2014   Ventral hernia    Weakness 08/2014   Past Surgical History:  Past Surgical History:  Procedure Laterality Date   ABDOMINAL AORTOGRAM W/LOWER EXTREMITY N/A 07/13/2021   Procedure: ABDOMINAL AORTOGRAM W/LOWER EXTREMITY;  Surgeon: Lorretta Harp, MD;  Location: [place] [facility];  Service: Cardiovascular;  Laterality: N/A;   ABDOMINAL AORTOGRAM W/LOWER EXTREMITY N/A 03/19/2022   Procedure: ABDOMINAL AORTOGRAM W/LOWER EXTREMITY;  Surgeon: Lorretta Harp, MD;  Location: [place] [facility];  Service: Cardiovascular;  Laterality: N/A;   APPENDECTOMY     CATARACT EXTRACTION, BILATERAL     COLONOSCOPY     COLONOSCOPY WITH PROPOFOL N/A 04/30/2016   Procedure: COLONOSCOPY WITH PROPOFOL;  Surgeon: Daneil Dolin, MD;  [facility];  Service: Endoscopy;  Laterality: N/A;  [place] N/A 10/13/2021   Procedure: OPEN  REPAIR EPIGASTRIC HERNIA WITH MESH PATCH;  Surgeon: Armandina Gemma, MD;  Location: [facility];  Service: General;  Laterality: N/A;   ESOPHAGOGASTRODUODENOSCOPY     approximately 2010   ESOPHAGOGASTRODUODENOSCOPY (EGD) WITH PROPOFOL N/A 04/30/2016   Procedure: ESOPHAGOGASTRODUODENOSCOPY (EGD) WITH PROPOFOL;  Surgeon: Daneil Dolin, MD;  [facility];  Service: Endoscopy;  Laterality: N/A;   IR GASTROSTOMY TUBE MOD SED  12/03/2022   LEFT HEART CATHETERIZATION WITH CORONARY ANGIOGRAM N/A 10/29/2014   07-14-20- pt denies this Procedure: LEFT HEART CATHETERIZATION WITH CORONARY ANGIOGRAM;  Surgeon: Burnell Blanks, MD;  Location[facility];  Service: Cardiovascular;  Laterality: N/A;   MALONEY DILATION N/A 04/30/2016   Procedure: Venia Minks DILATION;  Surgeon: Daneil Dolin, MD;  [facility];  Service: Endoscopy;  Laterality: N/A;   PERIPHERAL VASCULAR INTERVENTION  07/13/2021   Procedure: PERIPHERAL VASCULAR INTERVENTION;  Surgeon: Lorretta Harp, MD;  Location: [place] [facility];  Service: Cardiovascular;;  left SFA left external iliac   PERIPHERAL VASCULAR INTERVENTION Right 03/19/2022   Procedure: PERIPHERAL VASCULAR INTERVENTION;  Surgeon: Lorretta Harp, MD;  Location: [place] [facility];  Service: Cardiovascular;  Laterality: Right;  SFA   POLYPECTOMY  04/30/2016   Procedure: POLYPECTOMY;  Surgeon: Daneil Dolin, MD;  [facility];  Service: Endoscopy;;  Sigmoid colon polyp removed via hot snare   UPPER GASTROINTESTINAL ENDOSCOPY     HPI: Lillyanna Glandon is a 67 y.o. female who  presented to [facility] ED on 11/29/22 with worsening dysphagia c/b inability to swallow pills or solid foods. She was sent to ED by PCP for PEG placement (PEG 12/03/22). PMHx includes CVAs, dysarthria, HTN, prior epigastric hernia repair, iliac stent. Pt has a long hx of dysphagia and has worked with SLP in acute, inpatient, and OP settings over two years. She has had multiple MBS studies.  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  Pt's dysphagia has been primarily oral (difficulty with onset of oral phase and oral control) but pharyngeal function has been largely intact with good bolus clearance and adequate airway protection. Review of 11/30/22 esophagram reveals similar mechanics of swallow. 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   Subjective: pleasant    Recommendations for follow up therapy are one component of a multi-disciplinary discharge planning process, led by the attending physician.  Recommendations may be updated based on patient status, additional functional criteria and insurance authorization.  Assessment / Plan / Recommendation     12/05/2022   11:19 AM  Clinical Impressions  Clinical Impression Patient presents with moderately severe oral and minimal pharyngeal dysphagia with improvement noted since prior MBS.  Oral dysphagia most notably c/b decreased lingual coordination, strength resulting in lingual pumping with delay in transiting and inconsistent oral retention.  Counting 1,2,3 facilitated motor system to improve motor planning, efficiency of swallow and use of neck extension and straw assisted to place bolus toward posterior oral cavity.  Do not recommend neck extension though to concern for loss of liquids into open airway.  Pharyngeal swallow was largely intact - strong without retention. At times, liquids spill to pyriform sinus prior to swallow triggering and puree piecemealed bolus spilling over vallecular space.   Pt's swallowing is inefficient with solids/puddings- however she should be able to tolerate Ensures, creamed soups, applesauce, etc.  Recommend consider advancing diet to full liquid - NO PUDDING NOR GRITS  SLP Visit Diagnosis Dysphagia, oral  phase (R13.11);Dysphagia, pharyngeal phase (R13.13)  Impact on safety and function Mild aspiration risk         12/05/2022   11:19 AM  Treatment Recommendations  Treatment Recommendations F/U MBS in ___ days (Comment);Defer until completion of intrumental exam        12/05/2022   11:28 AM  Prognosis  Prognosis for Safe Diet Advancement Good  Barriers to Reach Goals Time post onset       12/05/2022   11:19 AM  Diet Recommendations  SLP Diet Recommendations Thin liquid;No liquids;Other (Comment)  Liquid Administration via Straw  Medication Administration Via alternative means  Compensations Slow rate;Small sips/bites;Other (Comment)  Postural Changes Seated upright at 90 degrees;Remain semi-upright after after feeds/meals (Comment)         12/05/2022   11:19 AM  Other Recommendations  Oral Care Recommendations Oral care BID  Follow Up Recommendations Skilled nursing-short term rehab (<3 hours/day)  Functional Status Assessment Patient has had a recent decline in their functional status and demonstrates the ability to make significant improvements in function in a reasonable and predictable amount of time.       12/05/2022   11:19 AM  Frequency and Duration   Speech Therapy Frequency (ACUTE ONLY) min 1 x/week  Treatment Duration 2 weeks         12/05/2022   11:06 AM  Oral Phase  Oral Phase Impaired  Oral - Honey Teaspoon Decreased bolus cohesion;Delayed oral transit;Lingual pumping;Premature spillage  Oral - Nectar Teaspoon Delayed oral transit;Decreased bolus cohesion;Lingual pumping  Oral - Nectar Cup Decreased bolus cohesion;Lingual pumping  Oral - Thin Teaspoon Lingual pumping  Oral - Thin Cup Delayed oral transit;Decreased bolus cohesion;Lingual/palatal residue  Oral - Thin Straw Delayed oral transit  Oral - Puree Premature spillage;Decreased bolus cohesion;Delayed oral transit;Lingual pumping;Lingual/palatal residue  Oral - Mech Soft Weak lingual  manipulation;Impaired mastication;Lingual/palatal residue;Delayed oral transit       12/05/2022   11:11 AM  Pharyngeal Phase  Pharyngeal Phase Impaired  Pharyngeal- Honey Teaspoon Delayed swallow initiation-pyriform sinuses;WFL  Pharyngeal Material does not enter airway  Pharyngeal- Nectar Teaspoon WFL;Reduced epiglottic inversion  Pharyngeal Material does not enter airway  Pharyngeal- Nectar Cup [facility]  Pharyngeal Material does not enter airway  Pharyngeal- Thin Teaspoon WFL;Delayed swallow initiation-vallecula  Pharyngeal Material does not enter airway  Pharyngeal- Thin Cup [facility]  Pharyngeal Material does not enter airway  Pharyngeal- Thin Straw WFL  Pharyngeal Material does not enter airway  Pharyngeal- Puree Delayed swallow initiation-pyriform sinuses  Pharyngeal Material does not enter airway  Pharyngeal Material does not enter airway  Pharyngeal Comment 12 second delay in swallow to swallow pudding retention        12/05/2022   11:14 AM  Cervical Esophageal Phase   Cervical Esophageal Phase WFL    Kathleen Lime, MS [facility] SLP Acute Rehab Services Office [phone]  Macario Golds 12/05/2022, 11:30 AM

## 2022-12-05 NOTE — Progress Notes (Signed)
PROGRESS NOTE  Kara Bailey  UKG:[id number] DOB: Apr 27, 1956 DOA: 11/29/2022 PCP: Andree Moro, DO   Brief Narrative: Patient is a 67 year old female with history of CVA, hypertension presented the emergency department at the request of her PCP for the evaluation of persistent dysphagia.  Found to have multifactorial dysphagia likely from stroke.  Status post G-tube placement by IR.  PT/OT recommending SNF on discharge  Assessment & Plan:  Principal Problem:   Dysphagia as late effect of cerebrovascular accident (CVA) Active Problems:   Essential hypertension   Left hemiplegia (HCC)   History of stroke   Dysphagia   Protein-calorie malnutrition, severe  Dysphagia: Likely secondary to residual effect of the ischemic stroke.  Has history of esophageal stricture last dilated on 2021.  GI consulted and was following.  Barium swallow done on 11/30/2022 was nondiagnostic.  Underwent G-tube placement by IR on 1/22.  Underwent MBS again today by speech therapy to check if she can swallow orally.Started on full liquid diet  Severe protein calorie malnutrition: Nutrition is following.  BMI of 18.  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  On Plavix, aspirin, Lipitor, Zetia.  Also on baclofen and Zanaflex for left-sided hemiplegia and spasticity  Hypertension: Currently blood pressure stable.  Hypomagnesemia: Continue magnesium supplementation.  Depression: On celexa  Debility/deconditioning: PT/OT recommending SNF on discharge.TOC following      Nutrition Problem: Severe Malnutrition Etiology: chronic illness, dysphagia (h/o CVA)    DVT prophylaxis:SCDs Start: 11/30/22 0151     Code Status: Full Code  Family Communication: None at bedside  Patient status:Inpatient  Patient is from :Home  Anticipated discharge to:SnF  Estimated DC date:whenever bed is available   Consultants: GI,IR  Procedures:G tube placement    Antimicrobials:  Anti-infectives (From admission, onward)    Start     Dose/Rate Route Frequency Ordered Stop   12/03/22 0700  ceFAZolin (ANCEF) IVPB 2g/100 mL premix        2 g 200 mL/hr over 30 Minutes Intravenous To Radiology 11/30/22 1602 12/04/22 0700       Subjective: Patient seen and examined at bedside today.  Hemodynamically stable.  Lying on the chair.  Comfortable.  Alert and oriented, obeys commands.  Denies any new complaints.  Objective: Vitals:   12/04/22 0613 12/04/22 1349 12/04/22 2018 12/05/22 0543  BP: 92/65 (!) 84/64 102/71 106/82  Pulse: 88 73 78 (!) 102  Resp:  '18 16 18  '$ Temp: 97.9 F (36.6 C)  98 F (36.7 C) 98.6 F (37 C)  TempSrc: Oral  Oral Oral  SpO2: 96% 96% 98% 100%  Weight:      Height:        Intake/Output Summary (Last 24 hours) at 12/05/2022 1311 Last data filed at 12/05/2022 0649 Gross per 24 hour  Intake --  Output 300 ml  Net -300 ml   Filed Weights   11/30/22 1602 12/02/22 0500 12/03/22 0332  Weight: 51.2 kg 51.6 kg 52.5 kg    Examination:  General exam: Overall comfortable, not in distress, deconditioned HEENT: PERRL Respiratory system:  no wheezes or crackles  Cardiovascular system: S1 & S2 heard, RRR.  Gastrointestinal system: Abdomen is nondistended, soft and nontender.  G-tube Central nervous system: Alert and oriented, left dense hemiplegia Extremities: No edema, no clubbing ,no cyanosis Skin: No rashes, no ulcers,no icterus     Data Reviewed: I have personally reviewed following labs and imaging studies  CBC: Recent Labs  Lab 11/29/22 2100  WBC 8.0  NEUTROABS 4.2  HGB 11.7*  HCT 36.8  MCV 89.8  PLT 638   Basic Metabolic Panel: Recent Labs  Lab 12/01/22 0711 12/01/22 1642 12/02/22 0856 12/02/22 1619 12/03/22 1122 12/04/22 0651 12/05/22 0629  NA 142  --  139  --  141 140 137  K 3.0*  --  4.4  --  4.0 3.8 3.6  CL 108  --  108  --  108 107 105  CO2 25  --  24  --  '27 26 25  '$ GLUCOSE 117*  --   103*  --  102* 92 97  BUN 5*  --  <5*  --  <5* <5* <5*  CREATININE 0.53  --  0.69  --  0.73 0.76 0.72  CALCIUM 8.9  --  8.1*  --  8.5* 8.3* 8.1*  MG 1.6*   < > 1.9 1.8 1.6* 1.5* 1.6*  PHOS 3.1   < > 3.0 3.5 3.7 3.8 2.9   < > = values in this interval not displayed.     Recent Results (from the past 240 hour(s))  Urine Culture     Status: Abnormal   Collection Time: 11/30/22 10:02 PM   Specimen: Urine, Clean Catch  Result Value Ref Range Status   Specimen Description   Final    URINE, CLEAN CATCH Performed at [facility] [place] [address] [place], [place] [address]    Special Requests   Final    NONE Performed at [facility], [place] [address], [place],  [address]    Culture >=100,000 COLONIES/mL ESCHERICHIA COLI (A)  Final   Report Status 12/04/2022 FINAL  Final   Organism ID, Bacteria ESCHERICHIA COLI (A)  Final      Susceptibility   Escherichia coli - MIC*    AMPICILLIN 4 SENSITIVE Sensitive     CEFAZOLIN <=4 SENSITIVE Sensitive     CEFEPIME <=0.12 SENSITIVE Sensitive     CEFTRIAXONE <=0.25 SENSITIVE Sensitive     CIPROFLOXACIN <=0.25 SENSITIVE Sensitive     GENTAMICIN <=1 SENSITIVE Sensitive     IMIPENEM <=0.25 SENSITIVE Sensitive     NITROFURANTOIN <=16 SENSITIVE Sensitive     TRIMETH/SULFA <=20 SENSITIVE Sensitive     AMPICILLIN/SULBACTAM <=2 SENSITIVE Sensitive     PIP/TAZO <=4 SENSITIVE Sensitive     * >=100,000 COLONIES/mL ESCHERICHIA COLI     Radiology Studies: DG Swallowing Func-Speech Pathology  Result Date: 12/05/2022 Table formatting from the original result was not included. Objective Swallowing Evaluation: Type of Study: Bedside Swallow Evaluation  Patient Details Name: Kara Bailey MRN: [id number] Date of Birth: [birth year] Today's Date: 12/05/2022 Time: SLP Start Time (ACUTE ONLY): 77 -SLP Stop Time (ACUTE ONLY): 0160 SLP Time Calculation (min) (ACUTE ONLY): 28 min Past Medical History: Past Medical History:  Diagnosis Date  Acute CVA (cerebrovascular accident) ([place]) 12/29/2020  Anxiety   Chest pain 08/2014  unspecified  Cirrhosis ([place])   CVA (cerebral vascular accident) ([place]) 07/27/2022  Depression   Dizziness 08/2014  Dyspnea 08/2014  Dysrhythmia   Emphysema lung (HCC)   GERD (gastroesophageal reflux disease)   Heart murmur   Hepatitis C   Hiatal hernia   Hypertension   Ischemic stroke ([place]) 08/01/2022  PAC (premature atrial contraction) 10/11/2014  PAD (peripheral artery disease) (HCC)   Palpitations   PVC (premature ventricular contraction) 10/11/2014  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  Stroke ([place])   just slurred speech  Tachycardia 08/2014  Ventral hernia   Weakness  08/2014 Past Surgical History: Past Surgical History: Procedure Laterality Date  ABDOMINAL AORTOGRAM W/LOWER EXTREMITY N/A 07/13/2021  Procedure: ABDOMINAL AORTOGRAM W/LOWER EXTREMITY;  Surgeon: Lorretta Harp, MD;  Location: [place] CV LAB;  Service: Cardiovascular;  Laterality: N/A;  ABDOMINAL AORTOGRAM W/LOWER EXTREMITY N/A 03/19/2022  Procedure: ABDOMINAL AORTOGRAM W/LOWER EXTREMITY;  Surgeon: Lorretta Harp, MD;  Location: [place] CV LAB;  Service: Cardiovascular;  Laterality: N/A;  APPENDECTOMY    CATARACT EXTRACTION, BILATERAL    COLONOSCOPY    COLONOSCOPY WITH PROPOFOL N/A 04/30/2016  Procedure: COLONOSCOPY WITH PROPOFOL;  Surgeon: Daneil Dolin, MD;  [facility];  Service: Endoscopy;  Laterality: N/A;  [place] N/A 10/13/2021  Procedure: OPEN REPAIR EPIGASTRIC HERNIA WITH MESH PATCH;  Surgeon: Armandina Gemma, MD;  Location: [facility];  Service: General;  Laterality: N/A;  ESOPHAGOGASTRODUODENOSCOPY    approximately 2010  ESOPHAGOGASTRODUODENOSCOPY (EGD) WITH PROPOFOL N/A 04/30/2016  Procedure: ESOPHAGOGASTRODUODENOSCOPY (EGD) WITH PROPOFOL;  Surgeon: Daneil Dolin, MD;  [facility];  Service: Endoscopy;  Laterality: N/A;  IR GASTROSTOMY TUBE MOD SED  12/03/2022  LEFT HEART  CATHETERIZATION WITH CORONARY ANGIOGRAM N/A 10/29/2014  07-14-20- pt denies this Procedure: LEFT HEART CATHETERIZATION WITH CORONARY ANGIOGRAM;  Surgeon: Burnell Blanks, MD;  Location: [facility] CATH LAB;  Service: Cardiovascular;  Laterality: N/A;  MALONEY DILATION N/A 04/30/2016  Procedure: Venia Minks DILATION;  Surgeon: Daneil Dolin, MD;  [facility];  Service: Endoscopy;  Laterality: N/A;  PERIPHERAL VASCULAR INTERVENTION  07/13/2021  Procedure: PERIPHERAL VASCULAR INTERVENTION;  Surgeon: Lorretta Harp, MD;  Location: [place] CV LAB;  Service: Cardiovascular;;  left SFA left external iliac  PERIPHERAL VASCULAR INTERVENTION Right 03/19/2022  Procedure: PERIPHERAL VASCULAR INTERVENTION;  Surgeon: Lorretta Harp, MD;  Location: [place] CV LAB;  Service: Cardiovascular;  Laterality: Right;  SFA  POLYPECTOMY  04/30/2016  Procedure: POLYPECTOMY;  Surgeon: Daneil Dolin, MD;  [facility];  Service: Endoscopy;;  Sigmoid colon polyp removed via hot snare  UPPER GASTROINTESTINAL ENDOSCOPY   HPI: Kyera Felan is a 67 y.o. female who presented to [facility] ED on 11/29/22 with worsening dysphagia c/b inability to swallow pills or solid foods. She was sent to ED by PCP for PEG placement (PEG 12/03/22). PMHx includes CVAs, dysarthria, HTN, prior epigastric hernia repair, iliac stent. Pt has a long hx of dysphagia and has worked with SLP in acute, inpatient, and OP settings over two years. She has had multiple MBS studies. 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  Pt's dysphagia has been primarily oral (difficulty with onset of oral phase and oral control) but pharyngeal function has been largely intact with good bolus clearance and adequate airway protection. Review of 11/30/22 esophagram reveals similar mechanics of swallow. Prior esophagram  revealed motility disorder and narrowing of the distal esophagus.  Subjective: pleasant  Recommendations for follow up therapy are one component of a multi-disciplinary discharge planning process, led by the attending physician.  Recommendations may be updated based on patient status, additional functional criteria and insurance authorization. Assessment / Plan / Recommendation   12/05/2022  11:19 AM Clinical Impressions Clinical Impression Patient presents with moderately severe oral and minimal pharyngeal dysphagia with improvement noted since prior MBS.  Oral dysphagia most notably c/b decreased lingual coordination, strength resulting in lingual pumping with delay in transiting  and inconsistent oral retention.  Counting 1,2,3 facilitated motor system to improve motor planning, efficiency of swallow and use of neck extension and straw assisted to place bolus toward posterior oral cavity.  Do not recommend neck extension though to concern for loss of liquids into open airway.  Pharyngeal swallow was largely intact - strong without retention. At times, liquids spill to pyriform sinus prior to swallow triggering and puree piecemealed bolus spilling over vallecular space.   Pt's swallowing is inefficient with solids/puddings- however she should be able to tolerate Ensures, creamed soups, applesauce, etc.  Recommend consider advancing diet to full liquid - NO PUDDING NOR GRITS SLP Visit Diagnosis Dysphagia, oral phase (R13.11);Dysphagia, pharyngeal phase (R13.13) Impact on safety and function Mild aspiration risk     12/05/2022  11:19 AM Treatment Recommendations Treatment Recommendations F/U MBS in ___ days (Comment);Defer until completion of intrumental exam     12/05/2022  11:28 AM Prognosis Prognosis for Safe Diet Advancement Good Barriers to Reach Goals Time post onset   12/05/2022  11:19 AM Diet Recommendations SLP Diet Recommendations Thin liquid;No liquids;Other (Comment) Liquid Administration via Straw Medication  Administration Via alternative means Compensations Slow rate;Small sips/bites;Other (Comment) Postural Changes Seated upright at 90 degrees;Remain semi-upright after after feeds/meals (Comment)     12/05/2022  11:19 AM Other Recommendations Oral Care Recommendations Oral care BID Follow Up Recommendations Skilled nursing-short term rehab (<3 hours/day) Functional Status Assessment Patient has had a recent decline in their functional status and demonstrates the ability to make significant improvements in function in a reasonable and predictable amount of time.   12/05/2022  11:19 AM Frequency and Duration  Speech Therapy Frequency (ACUTE ONLY) min 1 x/week Treatment Duration 2 weeks     12/05/2022  11:06 AM Oral Phase Oral Phase Impaired Oral - Honey Teaspoon Decreased bolus cohesion;Delayed oral transit;Lingual pumping;Premature spillage Oral - Nectar Teaspoon Delayed oral transit;Decreased bolus cohesion;Lingual pumping Oral - Nectar Cup Decreased bolus cohesion;Lingual pumping Oral - Thin Teaspoon Lingual pumping Oral - Thin Cup Delayed oral transit;Decreased bolus cohesion;Lingual/palatal residue Oral - Thin Straw Delayed oral transit Oral - Puree Premature spillage;Decreased bolus cohesion;Delayed oral transit;Lingual pumping;Lingual/palatal residue Oral - Mech Soft Weak lingual manipulation;Impaired mastication;Lingual/palatal residue;Delayed oral transit    12/05/2022  11:11 AM Pharyngeal Phase Pharyngeal Phase Impaired Pharyngeal- Honey Teaspoon Delayed swallow initiation-pyriform sinuses;WFL Pharyngeal Material does not enter airway Pharyngeal- Nectar Teaspoon WFL;Reduced epiglottic inversion Pharyngeal Material does not enter airway Pharyngeal- Nectar Cup [facility] Pharyngeal Material does not enter airway Pharyngeal- Thin Teaspoon WFL;Delayed swallow initiation-vallecula Pharyngeal Material does not enter airway Pharyngeal- Thin Cup [facility] Pharyngeal Material does not enter airway Pharyngeal- Thin Straw WFL Pharyngeal  Material does not enter airway Pharyngeal- Puree Delayed swallow initiation-pyriform sinuses Pharyngeal Material does not enter airway Pharyngeal Material does not enter airway Pharyngeal Comment 12 second delay in swallow to swallow pudding retention    12/05/2022  11:14 AM Cervical Esophageal Phase  Cervical Esophageal Phase WFL Kathleen Lime, MS [facility] SLP Acute Rehab Services Office [phone] Macario Golds 12/05/2022, 11:32 AM                     IR GASTROSTOMY TUBE MOD SED  Result Date: 12/03/2022 INDICATION: Dysphagia. EXAM: PERCUTANEOUS GASTROSTOMY TUBE PLACEMENT COMPARISON:  CT [facility], 11/30/2022. MEDICATIONS: Ancef 2 gm IV; Antibiotics were administered within 1 hour of the procedure. Glucagon 0.5 mg IV. CONTRAST:  20 mL of Isovue 300 administered into the gastric lumen. ANESTHESIA/SEDATION: Moderate (conscious) sedation was employed during this procedure.  A total of Versed 2 mg and Fentanyl 100 mcg was administered intravenously. Moderate Sedation Time: 12 minutes. The patient's level of consciousness and vital signs were monitored continuously by radiology nursing throughout the procedure under my direct supervision. FLUOROSCOPY TIME:  Fluoroscopic dose; 1 mGy COMPLICATIONS: None immediate. PROCEDURE: Informed written consent was obtained from the patient and/or patient's representative following explanation of the procedure, risks, benefits and alternatives. A time out was performed prior to the initiation of the procedure. Maximal barrier sterile technique utilized including caps, mask, sterile gowns, sterile gloves, large sterile drape, hand hygiene and Betadine prep. The LEFT upper quadrant was sterilely prepped and draped. A oral gastric catheter was inserted into the stomach under fluoroscopy. The existing nasogastric feeding tube was removed. The left costal margin and barium opacified transverse colon were identified and avoided. Air was injected into the stomach for insufflation and visualization  under fluoroscopy. Under sterile conditions and local anesthesia, 2 T tacks were utilized to pexy the anterior aspect of the stomach against the ventral abdominal wall. Contrast injection confirmed appropriate positioning of each of the T tacks. An incision was made between the T tacks and a 17 gauge trocar needle was utilized to access the stomach. Needle position was confirmed within the stomach with aspiration of air and injection of a small amount of contrast. A stiff Glidewire was advanced into the gastric lumen and under intermittent fluoroscopic guidance and a telescoping peel-away sheath was inserted, ultimately allowing placement of a 18 Fr balloon retention gastrostomy tube. The retention balloon was insufflated with a mixture of dilute saline and contrast and pulled taut against the anterior wall of the stomach. The external disc was cinched. Contrast injection confirms positioning within the stomach. Several spot radiographic images were obtained in various obliquities for documentation. T he patient tolerated procedure well without immediate post procedural complication. FINDINGS: After successful fluoroscopic guided placement, the gastrostomy tube is appropriately positioned with internal retention balloon against the ventral aspect of the gastric lumen. IMPRESSION: Successful fluoroscopic insertion of an 18 Fr balloon retention gastrostomy tube. PLAN: *The gastrostomy may be used for medication administration within 6 hours of placement, and in 24 hrs for the initiation of feeds. * The patient will return to Vascular Interventional Radiology (VIR) for routine feeding tube evaluation and exchange in 6 months. Michaelle Birks, MD [facility] Electronically Signed   By: Michaelle Birks M.D.   On: 12/03/2022 18:06    Scheduled Meds:  atorvastatin  80 mg Per Tube QPC supper   diclofenac Sodium  4 g Topical QID   ezetimibe  10 mg Per Tube Daily    free water  100 mL Per Tube Q4H   magnesium oxide  800 mg Per Tube BID   melatonin  3 mg Per Tube QHS   multivitamin  15 mL Per Tube Daily   pantoprazole (PROTONIX) IV  40 mg Intravenous Q12H   thiamine  100 mg Per Tube Daily   tiZANidine  2 mg Per Tube BID   Continuous Infusions:  sodium chloride 50 mL/hr at 12/04/22 1454   feeding supplement (OSMOLITE 1.5 CAL) 1,000 mL (12/04/22 1536)     LOS: 4 days   Shelly Coss, MD [facility] P1/24/2024, 1:11 PM

## 2022-12-05 NOTE — Progress Notes (Signed)
Speech Language Pathology Treatment: Dysphagia  Patient Details Name: Kara Bailey MRN: [id number] DOB: 1955-11-20 Today's Date: 12/05/2022 Time: 1275-1700 SLP Time Calculation (min) (ACUTE ONLY): 11 min  Assessment / Plan / Recommendation Clinical Impression  Pt seen to review MBS and provide compensation strategies verbally and in writing. Pt had just worked with PT - purewick needing to be replaced. Provided pt with Ensure mixed with icecream - which she was able to swallow using straw and counting 1,2,3, strategy. Advised the counting strategy is helpful due her h/o stroke - improving motor initiation. She reports she enjoys this Ensure/ice cream concoction - recommend she continue being given full liquid. Will follow up with her - using teach back - pt verbalized understanding. Will provide her with communication board given her dysarthria from her CVA. Highly recommend follow up at next venue of care to maximize her rehab potential - given strategies in place to mitigate her dysphagia.   HPI HPI: Kara Bailey is a 67 y.o. female who presented to [facility] ED on 11/29/22 with worsening dysphagia c/b inability to swallow pills or solid foods. She was sent to ED by PCP for PEG placement (PEG 12/03/22). PMHx includes CVAs, dysarthria, HTN, prior epigastric hernia repair, iliac stent. Pt has a long hx of dysphagia and has worked with SLP in acute, inpatient, and OP settings over two years. She has had multiple MBS studies. 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  Pt's dysphagia has been primarily oral (difficulty with onset of oral phase and oral control) but pharyngeal function has been largely intact with good bolus clearance and adequate airway protection. Review of 11/30/22 esophagram reveals similar mechanics of swallow. Prior esophagram revealed  motility disorder and narrowing of the distal esophagus.      SLP Plan  Continue with current plan of care      Recommendations for follow up therapy are one component of a multi-disciplinary discharge planning process, led by the attending physician.  Recommendations may be updated based on patient status, additional functional criteria and insurance authorization.    Recommendations  Diet recommendations: Thin liquid;Nectar-thick liquid (full liquids) Liquids provided via: Cup;Straw Medication Administration: Via alternative means Supervision: Patient able to self feed Compensations: Slow rate;Small sips/bites;Other (Comment) Postural Changes and/or Swallow Maneuvers: Seated upright 90 degrees;Upright 30-60 min after meal (count 1, 2,3 swallow)                Oral Care Recommendations: Oral care QID Follow Up Recommendations: Skilled nursing-short term rehab (<3 hours/day) SLP Visit Diagnosis: Dysphagia, oral phase (R13.11);Dysphagia, pharyngeal phase (R13.13) Plan: Continue with current plan of care         Kathleen Lime, MS [place] Office [phone]   Macario Golds  12/05/2022, 4:03 PM

## 2022-12-05 NOTE — TOC Progression Note (Addendum)
Transition of Care [facility]) - Progression Note    Patient Details  Name: Kara Bailey MRN: [id number] Date of Birth: Jun 16, 1956  [facility]) CM/SW [place], RN Phone Number:[phone]  12/05/2022, 2:53 PM  Clinical Narrative:    CM called son marcianne ozbun to make him aware of bed offers. Son states that he is unfamiliar with facilites. Son requesting a few minutes to call to check location of facilities and location of family members to the facility. CM will await return call with facility choice.   1617 Son returned call and would like placement at [facility]. CM to follow up with Mendel Corning.   [place] spoke with Sonnie Alamo in admissions at [facility]. Facility is able to accept patient. CM to initiate insurance auth.  1630 Patient is not managed by [facility] health. This was verified by CM. Cm attempted to make [name] at [facility] aware. There is no answer. Message has been left. TOC will follow up in the am.       Expected Discharge Plan and Services                                               Social Determinants of Health (SDOH) Interventions SDOH Screenings   Food Insecurity: Unknown (12/04/2022)  Housing: Low Risk  (12/04/2022)  Transportation Needs: Unknown (12/04/2022)  Utilities: Unknown (12/04/2022)  Depression (PHQ2-9): High Risk (09/19/2022)  Tobacco Use: High Risk (12/03/2022)    Readmission Risk Interventions     No data to display

## 2022-12-06 DIAGNOSIS — I69391 Dysphagia following cerebral infarction: Secondary | ICD-10-CM | POA: Diagnosis not present

## 2022-12-06 LAB — PHOSPHORUS: Phosphorus: 3.2 mg/dL (ref 2.5–4.6)

## 2022-12-06 LAB — COMPREHENSIVE METABOLIC PANEL
ALT: 9 U/L (ref 0–44)
AST: 22 U/L (ref 15–41)
Albumin: 2.5 g/dL — ABNORMAL LOW (ref 3.5–5.0)
Alkaline Phosphatase: 69 U/L (ref 38–126)
Anion gap: 8 (ref 5–15)
BUN: 7 mg/dL — ABNORMAL LOW (ref 8–23)
CO2: 25 mmol/L (ref 22–32)
Calcium: 8.4 mg/dL — ABNORMAL LOW (ref 8.9–10.3)
Chloride: 106 mmol/L (ref 98–111)
Creatinine, Ser: 0.61 mg/dL (ref 0.44–1.00)
GFR, Estimated: >60 mL/min (ref >60)
Glucose, Bld: 93 mg/dL (ref 70–99)
Potassium: 4 mmol/L (ref 3.5–5.1)
Sodium: 139 mmol/L (ref 135–145)
Total Bilirubin: 0.3 mg/dL (ref 0.3–1.2)
Total Protein: 5.2 g/dL — ABNORMAL LOW (ref 6.5–8.1)

## 2022-12-06 LAB — GLUCOSE, CAPILLARY
Glucose-Capillary: 102 mg/dL — ABNORMAL HIGH (ref 70–99)
Glucose-Capillary: 104 mg/dL — ABNORMAL HIGH (ref 70–99)
Glucose-Capillary: 121 mg/dL — ABNORMAL HIGH (ref 70–99)
Glucose-Capillary: 126 mg/dL — ABNORMAL HIGH (ref 70–99)
Glucose-Capillary: 131 mg/dL — ABNORMAL HIGH (ref 70–99)
Glucose-Capillary: 140 mg/dL — ABNORMAL HIGH (ref 70–99)

## 2022-12-06 LAB — MAGNESIUM: Magnesium: 1.8 mg/dL (ref 1.7–2.4)

## 2022-12-06 MED ORDER — CLOPIDOGREL BISULFATE 75 MG PO TABS
75.0000 mg | ORAL_TABLET | Freq: Every day | ORAL | Status: DC
  Administered 2022-12-06 – 2022-12-07 (×2): 75 mg
  Filled 2022-12-06 (×2): qty 1

## 2022-12-06 MED ORDER — ALPRAZOLAM 0.25 MG PO TABS
0.2500 mg | ORAL_TABLET | Freq: Three times a day (TID) | ORAL | Status: DC | PRN
  Administered 2022-12-06 – 2022-12-10 (×4): 0.25 mg via ORAL
  Filled 2022-12-06 (×4): qty 1

## 2022-12-06 NOTE — TOC Progression Note (Addendum)
Transition of Care [facility]) - Progression Note    Patient Details  Name: Kara Bailey MRN: [id number] Date of Birth: 1956-05-25  Transition of Care [facility]) CM/SW [place], RN Phone [phone]  12/06/2022, 10:34 AM  Clinical Narrative:    CM spoke with [name] at [facility] to make her aware that CM was unable to initiate auth due to patient not managed by [facility] health. [name] states that she will initiate insurance auth.   [place] still pending       Expected Discharge Plan and Services                                               Social Determinants of Health (SDOH) Interventions SDOH Screenings   Food Insecurity: Unknown (12/04/2022)  Housing: Low Risk  (12/04/2022)  Transportation Needs: Unknown (12/04/2022)  Utilities: Unknown (12/04/2022)  Depression (PHQ2-9): High Risk (09/19/2022)  Tobacco Use: High Risk (12/03/2022)    Readmission Risk Interventions     No data to display

## 2022-12-06 NOTE — Progress Notes (Signed)
PROGRESS NOTE  TISA WEISEL  UYQ:[id number] DOB: 26-May-1956 DOA: 11/29/2022 PCP: Andree Moro, DO   Brief Narrative: Patient is a 67 year old female with history of CVA, hypertension presented the emergency department at the request of her PCP for the evaluation of persistent dysphagia.  Found to have multifactorial dysphagia likely from stroke.  Status post G-tube placement by IR.  PT/OT recommending SNF on discharge.Medically stable for dc whenever possible  Assessment & Plan:  Principal Problem:   Dysphagia as late effect of cerebrovascular accident (CVA) Active Problems:   Essential hypertension   Left hemiplegia (HCC)   History of stroke   Dysphagia   Protein-calorie malnutrition, severe  Dysphagia: Likely secondary to residual effect of the ischemic stroke.  Has history of esophageal stricture last dilated on 2021.  GI consulted and was following.  Barium swallow done on 11/30/2022 was nondiagnostic.  Underwent G-tube placement by IR on 1/22.  Underwent MBS again today by speech therapy to check if she can swallow orally.Started on full liquid diet  Severe protein calorie malnutrition: Nutrition is following.  BMI of 18.  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  On Plavix, aspirin, Lipitor, Zetia.  Also on baclofen and Zanaflex for left-sided hemiplegia and spasticity  Hypertension: Currently blood pressure stable.  Hypomagnesemia: Continue magnesium supplementation.  Depression: On celexa  Debility/deconditioning: PT/OT recommending SNF on discharge.TOC following      Nutrition Problem: Severe Malnutrition Etiology: chronic illness, dysphagia (h/o CVA)    DVT prophylaxis:SCDs Start: 11/30/22 0151     Code Status: Full Code  Family Communication: None at bedside  Patient status:Inpatient  Patient is from :Home  Anticipated discharge to:SnF  Estimated DC date:whenever bed is  available   Consultants: GI,IR  Procedures:G tube placement   Antimicrobials:  Anti-infectives (From admission, onward)    Start     Dose/Rate Route Frequency Ordered Stop   12/03/22 0700  ceFAZolin (ANCEF) IVPB 2g/100 mL premix        2 g 200 mL/hr over 30 Minutes Intravenous To Radiology 11/30/22 1602 12/04/22 0700       Subjective: Patient seen and examined at bedside today.  Hemodynamically stable.  Comfortable.  On full liquid diet as well.  Denies any new complaints today.  Objective: Vitals:   12/05/22 0543 12/05/22 1352 12/05/22 2142 12/06/22 0418  BP: 106/82 1'16/80 93/64 96/63 '$  Pulse: (!) 102 95 89 85  Resp: '18 18 14 16  '$ Temp: 98.6 F (37 C) 99.1 F (37.3 C) 98.3 F (36.8 C) 98.3 F (36.8 C)  TempSrc: Oral Oral Oral Oral  SpO2: 100% 100% 100% 100%  Weight:    53 kg  Height:        Intake/Output Summary (Last 24 hours) at 12/06/2022 1103 Last data filed at 12/06/2022 0858 Gross per 24 hour  Intake 2605.25 ml  Output 200 ml  Net 2405.25 ml   Filed Weights   12/02/22 0500 12/03/22 0332 12/06/22 0418  Weight: 51.6 kg 52.5 kg 53 kg    Examination:  General exam: Overall comfortable, not in distress,deconditioned HEENT: PERRL Respiratory system:  no wheezes or crackles  Cardiovascular system: S1 & S2 heard, RRR.  Gastrointestinal system: Abdomen is nondistended, soft and nontender.G tube Central nervous system: Alert and oriented,left sided hemiplegia  Extremities: No edema, no clubbing ,no cyanosis Skin: No rashes, no ulcers,no icterus     Data Reviewed: I have personally reviewed following labs and imaging studies  CBC: Recent Labs  Lab 11/29/22 2100  WBC 8.0  NEUTROABS 4.2  HGB 11.7*  HCT 36.8  MCV 89.8  PLT 425   Basic Metabolic Panel: Recent Labs  Lab 12/02/22 0856 12/02/22 1619 12/03/22 1122 12/04/22 0651 12/05/22 0629 12/06/22 0636  NA 139  --  141 140 137 139  K 4.4  --  4.0 3.8 3.6 4.0  CL 108  --  108 107 105 106  CO2  24  --  '27 26 25 25  '$ GLUCOSE 103*  --  102* 92 97 93  BUN <5*  --  <5* <5* <5* 7*  CREATININE 0.69  --  0.73 0.76 0.72 0.61  CALCIUM 8.1*  --  8.5* 8.3* 8.1* 8.4*  MG 1.9 1.8 1.6* 1.5* 1.6* 1.8  PHOS 3.0 3.5 3.7 3.8 2.9 3.2     Recent Results (from the past 240 hour(s))  Urine Culture     Status: Abnormal   Collection Time: 11/30/22 10:02 PM   Specimen: Urine, Clean Catch  Result Value Ref Range Status   Specimen Description   Final    URINE, CLEAN CATCH Performed at [facility], [place] [address], [place], [place] [address]    Special Requests   Final    NONE Performed at [facility], [place] [address], [place], [place] [address]    Culture >=100,000 COLONIES/mL ESCHERICHIA COLI (A)  Final   Report Status 12/04/2022 FINAL  Final   Organism ID, Bacteria ESCHERICHIA COLI (A)  Final      Susceptibility   Escherichia coli - MIC*    AMPICILLIN 4 SENSITIVE Sensitive     CEFAZOLIN <=4 SENSITIVE Sensitive     CEFEPIME <=0.12 SENSITIVE Sensitive     CEFTRIAXONE <=0.25 SENSITIVE Sensitive     CIPROFLOXACIN <=0.25 SENSITIVE Sensitive     GENTAMICIN <=1 SENSITIVE Sensitive     IMIPENEM <=0.25 SENSITIVE Sensitive     NITROFURANTOIN <=16 SENSITIVE Sensitive     TRIMETH/SULFA <=20 SENSITIVE Sensitive     AMPICILLIN/SULBACTAM <=2 SENSITIVE Sensitive     PIP/TAZO <=4 SENSITIVE Sensitive     * >=100,000 COLONIES/mL ESCHERICHIA COLI     Radiology Studies: DG Swallowing Func-Speech Pathology  Result Date: 12/05/2022 Table formatting from the original result was not included. Objective Swallowing Evaluation: Type of Study: Bedside Swallow Evaluation  Patient Details Name: NICCI VAUGHAN MRN: [id number] Date of Birth: [birth year] Today's Date: 12/05/2022 Time: SLP Start Time (ACUTE ONLY): 34 -SLP Stop Time (ACUTE ONLY): 6606 SLP Time Calculation (min) (ACUTE ONLY): 28 min Past Medical History: Past Medical History: Diagnosis Date  Acute CVA (cerebrovascular  accident) ([place]) 12/29/2020  Anxiety   Chest pain 08/2014  unspecified  Cirrhosis ([place])   CVA (cerebral vascular accident) ([place]) 07/27/2022  Depression   Dizziness 08/2014  Dyspnea 08/2014  Dysrhythmia   Emphysema lung (HCC)   GERD (gastroesophageal reflux disease)   Heart murmur   Hepatitis C   Hiatal hernia   Hypertension   Ischemic stroke ([place]) 08/01/2022  PAC (premature atrial contraction) 10/11/2014  PAD (peripheral artery disease) (HCC)   Palpitations   PVC (premature ventricular contraction) 10/11/2014  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  Stroke ([place])   just slurred speech  Tachycardia 08/2014  Ventral hernia   Weakness 08/2014 Past Surgical History: Past Surgical History: Procedure Laterality Date  ABDOMINAL AORTOGRAM W/LOWER EXTREMITY N/A 07/13/2021  Procedure: ABDOMINAL AORTOGRAM W/LOWER EXTREMITY;  Surgeon: Lorretta Harp, MD;  Location: [place] CV LAB;  Service: Cardiovascular;  Laterality: N/A;  ABDOMINAL AORTOGRAM W/LOWER EXTREMITY N/A 03/19/2022  Procedure: ABDOMINAL AORTOGRAM W/LOWER EXTREMITY;  Surgeon: Lorretta Harp, MD;  Location: [place] CV LAB;  Service: Cardiovascular;  Laterality: N/A;  APPENDECTOMY    CATARACT EXTRACTION, BILATERAL    COLONOSCOPY    COLONOSCOPY WITH PROPOFOL N/A 04/30/2016  Procedure: COLONOSCOPY WITH PROPOFOL;  Surgeon: Daneil Dolin, MD;  [facility];  Service: Endoscopy;  Laterality: N/A;  [place] N/A 10/13/2021  Procedure: OPEN REPAIR EPIGASTRIC HERNIA WITH MESH PATCH;  Surgeon: Armandina Gemma, MD;  Location: [facility];  Service: General;  Laterality: N/A;  ESOPHAGOGASTRODUODENOSCOPY    approximately 2010  ESOPHAGOGASTRODUODENOSCOPY (EGD) WITH PROPOFOL N/A 04/30/2016  Procedure: ESOPHAGOGASTRODUODENOSCOPY (EGD) WITH PROPOFOL;  Surgeon: Daneil Dolin, MD;  [facility];  Service: Endoscopy;  Laterality: N/A;  IR GASTROSTOMY TUBE MOD SED  12/03/2022  LEFT HEART CATHETERIZATION WITH CORONARY ANGIOGRAM N/A  10/29/2014  07-14-20- pt denies this Procedure: LEFT HEART CATHETERIZATION WITH CORONARY ANGIOGRAM;  Surgeon: Burnell Blanks, MD;  Location: [facility] CATH LAB;  Service: Cardiovascular;  Laterality: N/A;  MALONEY DILATION N/A 04/30/2016  Procedure: Venia Minks DILATION;  Surgeon: Daneil Dolin, MD;  [facility];  Service: Endoscopy;  Laterality: N/A;  PERIPHERAL VASCULAR INTERVENTION  07/13/2021  Procedure: PERIPHERAL VASCULAR INTERVENTION;  Surgeon: Lorretta Harp, MD;  Location: [place] CV LAB;  Service: Cardiovascular;;  left SFA left external iliac  PERIPHERAL VASCULAR INTERVENTION Right 03/19/2022  Procedure: PERIPHERAL VASCULAR INTERVENTION;  Surgeon: Lorretta Harp, MD;  Location: [place] CV LAB;  Service: Cardiovascular;  Laterality: Right;  SFA  POLYPECTOMY  04/30/2016  Procedure: POLYPECTOMY;  Surgeon: Daneil Dolin, MD;  [facility];  Service: Endoscopy;;  Sigmoid colon polyp removed via hot snare  UPPER GASTROINTESTINAL ENDOSCOPY   HPI: Yeimy Brabant is a 67 y.o. female who presented to [facility] ED on 11/29/22 with worsening dysphagia c/b inability to swallow pills or solid foods. She was sent to ED by PCP for PEG placement (PEG 12/03/22). PMHx includes CVAs, dysarthria, HTN, prior epigastric hernia repair, iliac stent. Pt has a long hx of dysphagia and has worked with SLP in acute, inpatient, and OP settings over two years. She has had multiple MBS studies. 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  Pt's dysphagia has been primarily oral (difficulty with onset of oral phase and oral control) but pharyngeal function has been largely intact with good bolus clearance and adequate airway protection. Review of 11/30/22 esophagram reveals similar mechanics of swallow. Prior esophagram revealed motility disorder and narrowing of  the distal esophagus.  Subjective: pleasant  Recommendations for follow up therapy are one component of a multi-disciplinary discharge planning process, led by the attending physician.  Recommendations may be updated based on patient status, additional functional criteria and insurance authorization. Assessment / Plan / Recommendation   12/05/2022  11:19 AM Clinical Impressions Clinical Impression Patient presents with moderately severe oral and minimal pharyngeal dysphagia with improvement noted since prior MBS.  Oral dysphagia most notably c/b decreased lingual coordination, strength resulting in lingual pumping with delay in transiting and inconsistent oral retention.  Counting 1,2,3 facilitated motor system to improve motor planning, efficiency of swallow and use of neck extension and straw assisted to place bolus toward posterior oral cavity.  Do not recommend neck extension though to concern for  loss of liquids into open airway.  Pharyngeal swallow was largely intact - strong without retention. At times, liquids spill to pyriform sinus prior to swallow triggering and puree piecemealed bolus spilling over vallecular space.   Pt's swallowing is inefficient with solids/puddings- however she should be able to tolerate Ensures, creamed soups, applesauce, etc.  Recommend consider advancing diet to full liquid - NO PUDDING NOR GRITS SLP Visit Diagnosis Dysphagia, oral phase (R13.11);Dysphagia, pharyngeal phase (R13.13) Impact on safety and function Mild aspiration risk     12/05/2022  11:19 AM Treatment Recommendations Treatment Recommendations F/U MBS in ___ days (Comment);Defer until completion of intrumental exam     12/05/2022  11:28 AM Prognosis Prognosis for Safe Diet Advancement Good Barriers to Reach Goals Time post onset   12/05/2022  11:19 AM Diet Recommendations SLP Diet Recommendations Thin liquid;No liquids;Other (Comment) Liquid Administration via Straw Medication Administration Via alternative means  Compensations Slow rate;Small sips/bites;Other (Comment) Postural Changes Seated upright at 90 degrees;Remain semi-upright after after feeds/meals (Comment)     12/05/2022  11:19 AM Other Recommendations Oral Care Recommendations Oral care BID Follow Up Recommendations Skilled nursing-short term rehab (<3 hours/day) Functional Status Assessment Patient has had a recent decline in their functional status and demonstrates the ability to make significant improvements in function in a reasonable and predictable amount of time.   12/05/2022  11:19 AM Frequency and Duration  Speech Therapy Frequency (ACUTE ONLY) min 1 x/week Treatment Duration 2 weeks     12/05/2022  11:06 AM Oral Phase Oral Phase Impaired Oral - Honey Teaspoon Decreased bolus cohesion;Delayed oral transit;Lingual pumping;Premature spillage Oral - Nectar Teaspoon Delayed oral transit;Decreased bolus cohesion;Lingual pumping Oral - Nectar Cup Decreased bolus cohesion;Lingual pumping Oral - Thin Teaspoon Lingual pumping Oral - Thin Cup Delayed oral transit;Decreased bolus cohesion;Lingual/palatal residue Oral - Thin Straw Delayed oral transit Oral - Puree Premature spillage;Decreased bolus cohesion;Delayed oral transit;Lingual pumping;Lingual/palatal residue Oral - Mech Soft Weak lingual manipulation;Impaired mastication;Lingual/palatal residue;Delayed oral transit    12/05/2022  11:11 AM Pharyngeal Phase Pharyngeal Phase Impaired Pharyngeal- Honey Teaspoon Delayed swallow initiation-pyriform sinuses;WFL Pharyngeal Material does not enter airway Pharyngeal- Nectar Teaspoon WFL;Reduced epiglottic inversion Pharyngeal Material does not enter airway Pharyngeal- Nectar Cup [facility] Pharyngeal Material does not enter airway Pharyngeal- Thin Teaspoon WFL;Delayed swallow initiation-vallecula Pharyngeal Material does not enter airway Pharyngeal- Thin Cup [facility] Pharyngeal Material does not enter airway Pharyngeal- Thin Straw WFL Pharyngeal Material does not enter airway  Pharyngeal- Puree Delayed swallow initiation-pyriform sinuses Pharyngeal Material does not enter airway Pharyngeal Material does not enter airway Pharyngeal Comment 12 second delay in swallow to swallow pudding retention    12/05/2022  11:14 AM Cervical Esophageal Phase  Cervical Esophageal Phase WFL Kathleen Lime, MS [facility] SLP Acute Rehab Services Office [phone] Macario Golds 12/05/2022, 11:32 AM                      Scheduled Meds:  atorvastatin  80 mg Per Tube QPC supper   clopidogrel  75 mg Per Tube Daily   diclofenac Sodium  4 g Topical QID   ezetimibe  10 mg Per Tube Daily   feeding supplement  237 mL Oral BID BM   free water  100 mL Per Tube Q4H   magnesium oxide  800 mg Per Tube BID   melatonin  3 mg Per Tube QHS   multivitamin  15 mL Per Tube Daily   pantoprazole (PROTONIX) IV  40 mg Intravenous Q12H   thiamine  100  mg Per Tube Daily   tiZANidine  2 mg Per Tube BID   Continuous Infusions:  feeding supplement (OSMOLITE 1.5 CAL) 1,000 mL (12/06/22 0353)     LOS: 5 days   Shelly Coss, MD [facility] P1/25/2024, 11:03 AM

## 2022-12-06 NOTE — Progress Notes (Signed)
Speech Language Pathology Treatment: Dysphagia (expressive communication)  Patient Details Name: Kara Bailey MRN: [id number] DOB: December 05, 1955 Today's Date: 12/06/2022 Time: 1015-1040 SLP Time Calculation (min) (ACUTE ONLY): 25 min  Assessment / Plan / Recommendation Clinical Impression  Pt seen today for skilled SLP to address swallow function - - She independently states use of straw and counting 1,2,3 improves her swallowing timing - Provided her encouragement informing her that during her last MBS she did not achieve lip closur e- thus she has improved. She reports she consumed Jello - but denies being ready to try purees. SLP offered her Ensure but she politely declined stating she gets "full" easily - pointing toward tube feeding. She consumed soda via straw with timely swallow with no indication of aspiration.   Pt reports frustration with "not being listened to" by night staff - thus SLP provided her with 2 communication boards, paper with clipboard and information on free AAC programs for a Apple phone that she may find helpful. Reviewed AAC programs with her demonstrating their use - to which she expressed interest. Her current phone *not at hospital* is an Android and SLP Is not familiar with helpful free AAC programs for this working system. Pt's articulation is understood if background noise is eliminated and listener is facing her - especially with known topic.   Encouraged pt to continue speaking to "practice" and maintain said skills. Printed MBS report for SNF SLP and placed the report and swallow precaution signs in the pt's discharge chart as tentative plans are for her to discharge. Pt reports understanding to all information provided and being comfortable with her current care plan. Please order follow up SLP at SNF to address dysarthria, AAC options and dysphagia to maximize pt's po, QOL, communication skills as she is motivated.   HPI HPI: Kara Bailey is a 67 y.o. female who  presented to [facility] ED on 11/29/22 with worsening dysphagia c/b inability to swallow pills or solid foods. She was sent to ED by PCP for PEG placement (PEG 12/03/22). PMHx includes CVAs, dysarthria, HTN, prior epigastric hernia repair, iliac stent. Pt has a long hx of dysphagia and has worked with SLP in acute, inpatient, and OP settings over two years. She has had multiple MBS studies. 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  Pt's dysphagia has been primarily oral (difficulty with onset of oral phase and oral control) but pharyngeal function has been largely intact with good bolus clearance and adequate airway protection. Review of 11/30/22 esophagram reveals similar mechanics of swallow. 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      SLP Plan  Continue with current plan of care      Recommendations for follow up therapy are one component of a multi-disciplinary discharge planning process, led by the attending physician.  Recommendations may be updated based on patient status, additional functional criteria and insurance authorization.    Recommendations  Diet recommendations: Thin liquid;Nectar-thick liquid (continue current diet per pt preference) Liquids provided via: Cup;Straw Medication Administration: Via alternative means Supervision: Patient able to self feed Compensations: Slow rate;Small sips/bites;Other (Comment) Postural Changes and/or Swallow Maneuvers: Seated upright 90 degrees;Upright 30-60 min after meal (count 1, 2,3 swallow)                Oral Care Recommendations: Oral care QID Follow Up Recommendations: Skilled  nursing-short term rehab (<3 hours/day) SLP Visit Diagnosis: Dysphagia, oral phase (R13.11);Dysphagia, pharyngeal phase (R13.13) Plan: Continue with current plan of  care         Kathleen Lime, MS [place] Office [phone]   Macario Golds  12/06/2022, 11:21 AM

## 2022-12-06 NOTE — Care Management Important Message (Signed)
Important Message  Patient Details IM Letter given. Name: Kara Bailey MRN: [id number] Date of Birth: [birth year]   Medicare Important Message Given:  Yes     Kerin Salen 12/06/2022, 10:49 AM

## 2022-12-07 DIAGNOSIS — I69391 Dysphagia following cerebral infarction: Secondary | ICD-10-CM | POA: Diagnosis not present

## 2022-12-07 LAB — GLUCOSE, CAPILLARY
Glucose-Capillary: 105 mg/dL — ABNORMAL HIGH (ref 70–99)
Glucose-Capillary: 114 mg/dL — ABNORMAL HIGH (ref 70–99)
Glucose-Capillary: 127 mg/dL — ABNORMAL HIGH (ref 70–99)
Glucose-Capillary: 100 mg/dL — ABNORMAL HIGH (ref 70–99)
Glucose-Capillary: 114 mg/dL — ABNORMAL HIGH (ref 70–99)
Glucose-Capillary: 97 mg/dL (ref 70–99)

## 2022-12-07 MED ORDER — POLYETHYLENE GLYCOL 3350 17 G PO PACK
17.0000 g | PACK | Freq: Every day | ORAL | Status: DC
  Filled 2022-12-07: qty 1

## 2022-12-07 MED ORDER — SENNOSIDES-DOCUSATE SODIUM 8.6-50 MG PO TABS
1.0000 | ORAL_TABLET | Freq: Two times a day (BID) | ORAL | Status: DC
  Administered 2022-12-07 (×2): 1 via ORAL
  Filled 2022-12-07 (×2): qty 1

## 2022-12-07 MED ORDER — MAGNESIUM OXIDE -MG SUPPLEMENT 400 (240 MG) MG PO TABS: 800.0000 mg | ORAL_TABLET | Freq: Two times a day (BID) | ORAL | Status: DC

## 2022-12-07 MED ORDER — MORPHINE SULFATE (PF) 2 MG/ML IV SOLN
1.0000 mg | INTRAVENOUS | Status: AC | PRN
  Administered 2022-12-07: 1 mg via SUBCUTANEOUS
  Filled 2022-12-07: qty 1

## 2022-12-07 MED ORDER — CEFADROXIL 500 MG PO CAPS
500.0000 mg | ORAL_CAPSULE | Freq: Two times a day (BID) | ORAL | Status: DC
  Administered 2022-12-07 (×2): 500 mg
  Filled 2022-12-07 (×2): qty 1

## 2022-12-07 MED ORDER — CEFADROXIL 500 MG PO CAPS: 500.0000 mg | ORAL_CAPSULE | Freq: Two times a day (BID) | ORAL | Status: DC

## 2022-12-07 MED ORDER — ALPRAZOLAM 0.25 MG PO TABS: 0.5000 mg | ORAL_TABLET | Freq: Every day | ORAL | 0 refills | Status: DC | PRN

## 2022-12-07 MED ORDER — OSMOLITE 1.5 CAL PO LIQD: 1000.0000 mL | ORAL | 0 refills | Status: DC

## 2022-12-07 MED ORDER — FREE WATER: 100.0000 mL | Status: DC

## 2022-12-07 MED ORDER — ENSURE ENLIVE PO LIQD: 237.0000 mL | Freq: Two times a day (BID) | ORAL | 12 refills | Status: DC

## 2022-12-07 MED ORDER — PANTOPRAZOLE SODIUM 40 MG PO TBEC: 40.0000 mg | DELAYED_RELEASE_TABLET | Freq: Every day | ORAL | 1 refills | Status: DC

## 2022-12-07 MED ORDER — TRAMADOL HCL 50 MG PO TABS: 50.0000 mg | ORAL_TABLET | Freq: Four times a day (QID) | ORAL | 0 refills | Status: DC | PRN

## 2022-12-07 NOTE — Progress Notes (Signed)
Mobility Specialist - Progress Note   12/07/22 1105  Mobility  Activity Transferred from bed to chair  Level of Assistance Moderate assist, patient does 50-74%  Activity Response Tolerated well  Mobility Referral Yes  $Mobility charge 1 Mobility   Pt was found in bed and agreeable to mobility and sitting in chair. Pt did x10 heel slides on each leg, requiring min-A with her L leg. She then after getting her BP taken by NT and RN was able to do x3 STS with mod-A. Afterwards she transferred to recliner chair with mod-A. Was left with all necessities in reach and chair alarm on.  Ferd Hibbs Mobility Specialist

## 2022-12-07 NOTE — TOC Progression Note (Addendum)
Transition of Care [facility]) - Progression Note    Patient Details  Name: Kara Bailey MRN: [id number] Date of Birth: 1956/01/27  Transition of Care [facility]) CM/SW Cochran, RN Phone Number:[phone]  12/07/2022, 10:31 AM  Clinical Narrative:    CM called [facility] to determine status of insurance auth. Admissions coordinator will follow up on auth and call CM back.   [place] initiated by facility- pending        Expected Discharge Plan and Services                                               Social Determinants of Health (SDOH) Interventions SDOH Screenings   Food Insecurity: Unknown (12/04/2022)  Housing: Low Risk  (12/04/2022)  Transportation Needs: Unknown (12/04/2022)  Utilities: Unknown (12/04/2022)  Depression (PHQ2-9): High Risk (09/19/2022)  Tobacco Use: High Risk (12/03/2022)    Readmission Risk Interventions     No data to display

## 2022-12-07 NOTE — Discharge Summary (Signed)
Physician Discharge Summary  Kara Bailey BPZ:[id number] DOB: 1956/03/02 DOA: 11/29/2022  PCP: Andree Moro, DO  Admit date: 11/29/2022 Discharge date: 12/07/2022  Admitted From: Home Disposition:  SNF  Discharge Condition:Stable CODE STATUS:FULL Diet recommendation: tube diet ,Full liquid  Brief/Interim Summary: Patient is a 67 year old female with history of CVA, hypertension presented the emergency department at the request of her PCP for the evaluation of persistent dysphagia.  Found to have multifactorial dysphagia likely from stroke.  Status post G-tube placement by IR.  PT/OT recommending SNF on discharge.Medically stable for dc whenever possible   Following problems were addressed during the hospitalization:  Dysphagia: Likely secondary to residual effect of the ischemic stroke.  Has history of esophageal stricture last dilated on 2021.  GI consulted and was following.  Barium swallow done on 11/30/2022 was nondiagnostic.  Underwent G-tube placement by IR on 1/22.  Underwent MBS by speech therapy to check if she can swallow orally.Started on full liquid diet   Severe protein calorie malnutrition: Nutritionist was  following.  BMI of 18.  Continue magnesium supplementation.   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  On Plavix, aspirin, Lipitor, Zetia.  Also on baclofen and Zanaflex for left-sided hemiplegia and spasticity   Hypertension: Currently blood pressure soft, antihypertensives discontinued.  Continue to monitor   Hypomagnesemia: Continue magnesium supplementation.   Depression: On celexa   Debility/deconditioning: PT/OT recommending SNF on discharge.TOC following  Discharge Diagnoses:  Principal Problem:   Dysphagia as late effect of cerebrovascular accident (CVA) Active Problems:   Essential hypertension   Left hemiplegia (HCC)   History of stroke   Dysphagia   Protein-calorie malnutrition,  severe    Discharge Instructions  Discharge Instructions     Diet general   Complete by: As directed    Tube diet + Full liquid   Discharge instructions   Complete by: As directed    1)Please take prescribed medications as instructed 2)Do a CBC and BMP tests in a week   Increase activity slowly   Complete by: As directed       Allergies as of 12/07/2022   No Known Allergies      Medication List     STOP taking these medications    diltiazem 240 MG 24 hr capsule Commonly known as: CARDIZEM CD       TAKE these medications    acetaminophen 325 MG tablet Commonly known as: TYLENOL Take 1-2 tablets (325-650 mg total) by mouth every 4 (four) hours as needed for mild pain.   albuterol 108 (90 Base) MCG/ACT inhaler Commonly known as: VENTOLIN HFA Inhale 2 puffs into the lungs every 6 (six) hours as needed for wheezing or shortness of breath.   ALPRAZolam 0.25 MG tablet Commonly known as: XANAX Take 2 tablets (0.5 mg total) by mouth daily as needed for anxiety. What changed: medication strength   aspirin EC 81 MG tablet Take 1 tablet (81 mg total) by mouth daily. Swallow whole.   atorvastatin 80 MG tablet Commonly known as: LIPITOR Take 1 tablet (80 mg total) by mouth daily.   Baclofen 5 MG Tabs Take 1 tablet by mouth every 8 (eight) hours as needed (pain).   citalopram 10 MG tablet Commonly known as: CELEXA Take 10 mg by mouth every morning.   clopidogrel 75 MG tablet Commonly known as: PLAVIX TAKE 1 TABLET EVERY DAY   ezetimibe 10 MG tablet Commonly known as: ZETIA Take 10 mg by mouth daily.  famotidine 40 MG tablet Commonly known as: PEPCID Take 40 mg by mouth at bedtime.   feeding supplement (OSMOLITE 1.5 CAL) Liqd Place 1,000 mLs into feeding tube continuous.   feeding supplement Liqd Take 237 mLs by mouth 2 (two) times daily between meals.   free water Soln Place 100 mLs into feeding tube every 4 (four) hours.   hydroxypropyl  methylcellulose / hypromellose 2.5 % ophthalmic solution Commonly known as: ISOPTO TEARS / GONIOVISC Place 1 drop into both eyes in the morning and at bedtime.   hydrOXYzine 50 MG tablet Commonly known as: ATARAX Take 50 mg by mouth every 8 (eight) hours as needed for anxiety.   magnesium oxide 400 (240 Mg) MG tablet Commonly known as: MAG-OX Place 2 tablets (800 mg total) into feeding tube 2 (two) times daily.   melatonin 3 MG Tabs tablet Take 1 tablet (3 mg total) by mouth at bedtime.   mirtazapine 15 MG tablet Commonly known as: REMERON Take 15 mg by mouth at bedtime. What changed: Another medication with the same name was removed. Continue taking this medication, and follow the directions you see here.   multivitamin with minerals Tabs tablet Take 1 tablet by mouth daily.   pantoprazole 40 MG tablet Commonly known as: Protonix Take 1 tablet (40 mg total) by mouth daily.   polyethylene glycol 17 g packet Commonly known as: MIRALAX / GLYCOLAX Take 17 g by mouth daily as needed.   senna-docusate 8.6-50 MG tablet Commonly known as: Senokot-S Take 1 tablet by mouth 2 (two) times daily.   thiamine 100 MG tablet Commonly known as: Vitamin B-1 Take 100 mg by mouth daily.   tiZANidine 2 MG tablet Commonly known as: ZANAFLEX TAKE 1 TABLET(2 MG) BY MOUTH AT BEDTIME   traMADol 50 MG tablet Commonly known as: ULTRAM Take 1 tablet (50 mg total) by mouth every 6 (six) hours as needed for moderate pain.        No Known Allergies  Consultations: IR,GI   Procedures/Studies: DG Swallowing Func-Speech Pathology  Result Date: 12/05/2022 Table formatting from the original result was not included. Objective Swallowing Evaluation: Type of Study: Bedside Swallow Evaluation  Patient Details Name: Kara Bailey MRN: [id number] Date of Birth: 1956/09/23 Today's Date: 12/05/2022 Time: SLP Start Time (ACUTE ONLY): 71 -SLP Stop Time (ACUTE ONLY): 6160 SLP Time Calculation (min)  (ACUTE ONLY): 28 min Past Medical History: Past Medical History: Diagnosis Date  Acute CVA (cerebrovascular accident) ([place]) 12/29/2020  Anxiety   Chest pain 08/2014  unspecified  Cirrhosis ([place])   CVA (cerebral vascular accident) ([place]) 07/27/2022  Depression   Dizziness 08/2014  Dyspnea 08/2014  Dysrhythmia   Emphysema lung (HCC)   GERD (gastroesophageal reflux disease)   Heart murmur   Hepatitis C   Hiatal hernia   Hypertension   Ischemic stroke ([place]) 08/01/2022  PAC (premature atrial contraction) 10/11/2014  PAD (peripheral artery disease) (HCC)   Palpitations   PVC (premature ventricular contraction) 10/11/2014  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  Stroke ([place])   just slurred speech  Tachycardia 08/2014  Ventral hernia   Weakness 08/2014 Past Surgical History: Past Surgical History: Procedure Laterality Date  ABDOMINAL AORTOGRAM W/LOWER EXTREMITY N/A 07/13/2021  Procedure: ABDOMINAL AORTOGRAM W/LOWER EXTREMITY;  Surgeon: Lorretta Harp, MD;  Location: [facility];  Service: Cardiovascular;  Laterality: N/A;  ABDOMINAL AORTOGRAM W/LOWER EXTREMITY N/A 03/19/2022  Procedure: ABDOMINAL AORTOGRAM W/LOWER EXTREMITY;  Surgeon: Lorretta Harp, MD;  Location: [facility];  Service:  Cardiovascular;  Laterality: N/A;  APPENDECTOMY    CATARACT EXTRACTION, BILATERAL    COLONOSCOPY    COLONOSCOPY WITH PROPOFOL N/A 04/30/2016  Procedure: COLONOSCOPY WITH PROPOFOL;  Surgeon: Daneil Dolin, MD;  [facility];  Service: Endoscopy;  Laterality: N/A;  [place] N/A 10/13/2021  Procedure: OPEN REPAIR EPIGASTRIC HERNIA WITH MESH PATCH;  Surgeon: Armandina Gemma, MD;  Location: [facility];  Service: General;  Laterality: N/A;  ESOPHAGOGASTRODUODENOSCOPY    approximately 2010  ESOPHAGOGASTRODUODENOSCOPY (EGD) WITH PROPOFOL N/A 04/30/2016  Procedure: ESOPHAGOGASTRODUODENOSCOPY (EGD) WITH PROPOFOL;  Surgeon: Daneil Dolin, MD;  [facility];  Service: Endoscopy;   Laterality: N/A;  IR GASTROSTOMY TUBE MOD SED  12/03/2022  LEFT HEART CATHETERIZATION WITH CORONARY ANGIOGRAM N/A 10/29/2014  07-14-20- pt denies this Procedure: LEFT HEART CATHETERIZATION WITH CORONARY ANGIOGRAM;  Surgeon: Burnell Blanks, MD;  Location: [facility];  Service: Cardiovascular;  Laterality: N/A;  MALONEY DILATION N/A 04/30/2016  Procedure: Venia Minks DILATION;  Surgeon: Daneil Dolin, MD;  [facility];  Service: Endoscopy;  Laterality: N/A;  PERIPHERAL VASCULAR INTERVENTION  07/13/2021  Procedure: PERIPHERAL VASCULAR INTERVENTION;  Surgeon: Lorretta Harp, MD;  Location: [facility];  Service: Cardiovascular;;  left SFA left external iliac  PERIPHERAL VASCULAR INTERVENTION Right 03/19/2022  Procedure: PERIPHERAL VASCULAR INTERVENTION;  Surgeon: Lorretta Harp, MD;  Location: [place] [facility];  Service: Cardiovascular;  Laterality: Right;  SFA  POLYPECTOMY  04/30/2016  Procedure: POLYPECTOMY;  Surgeon: Daneil Dolin, MD;  [facility];  Service: Endoscopy;;  Sigmoid colon polyp removed via hot snare  UPPER GASTROINTESTINAL ENDOSCOPY   HPI: Kara Bailey is a 67 y.o. female who presented to [facility] ED on 11/29/22 with worsening dysphagia c/b inability to swallow pills or solid foods. She was sent to ED by PCP for PEG placement (PEG 12/03/22). PMHx includes CVAs, dysarthria, HTN, prior epigastric hernia repair, iliac stent. Pt has a long hx of dysphagia and has worked with SLP in acute, inpatient, and OP settings over two years. She has had multiple MBS studies. 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  Pt's dysphagia has been primarily oral (difficulty with onset of oral phase and oral control) but pharyngeal function has been largely intact with good bolus clearance and adequate airway protection. Review of  11/30/22 esophagram reveals similar mechanics of swallow. 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  Subjective: pleasant  Recommendations for follow up therapy are one component of a multi-disciplinary discharge planning process, led by the attending physician.  Recommendations may be updated based on patient status, additional functional criteria and insurance authorization. Assessment / Plan / Recommendation   12/05/2022  11:19 AM Clinical Impressions Clinical Impression Patient presents with moderately severe oral and minimal pharyngeal dysphagia with improvement noted since prior MBS.  Oral dysphagia most notably c/b decreased lingual coordination, strength resulting in lingual pumping with delay in transiting and inconsistent oral retention.  Counting 1,2,3 facilitated motor system to improve motor planning, efficiency of swallow and use of neck extension and straw assisted to place bolus toward posterior oral cavity.  Do not recommend neck extension though to concern for loss of liquids into open airway.  Pharyngeal swallow was largely intact - strong without retention. At times, liquids spill to pyriform sinus prior to swallow  triggering and puree piecemealed bolus spilling over vallecular space.   Pt's swallowing is inefficient with solids/puddings- however she should be able to tolerate Ensures, creamed soups, applesauce, etc.  Recommend consider advancing diet to full liquid - NO PUDDING NOR GRITS SLP Visit Diagnosis Dysphagia, oral phase (R13.11);Dysphagia, pharyngeal phase (R13.13) Impact on safety and function Mild aspiration risk     12/05/2022  11:19 AM Treatment Recommendations Treatment Recommendations F/U MBS in ___ days (Comment);Defer until completion of intrumental exam     12/05/2022  11:28 AM Prognosis Prognosis for Safe Diet Advancement Good Barriers to Reach Goals Time post onset   12/05/2022  11:19 AM Diet Recommendations SLP Diet Recommendations Thin  liquid;No liquids;Other (Comment) Liquid Administration via Straw Medication Administration Via alternative means Compensations Slow rate;Small sips/bites;Other (Comment) Postural Changes Seated upright at 90 degrees;Remain semi-upright after after feeds/meals (Comment)     12/05/2022  11:19 AM Other Recommendations Oral Care Recommendations Oral care BID Follow Up Recommendations Skilled nursing-short term rehab (<3 hours/day) Functional Status Assessment Patient has had a recent decline in their functional status and demonstrates the ability to make significant improvements in function in a reasonable and predictable amount of time.   12/05/2022  11:19 AM Frequency and Duration  Speech Therapy Frequency (ACUTE ONLY) min 1 x/week Treatment Duration 2 weeks     12/05/2022  11:06 AM Oral Phase Oral Phase Impaired Oral - Honey Teaspoon Decreased bolus cohesion;Delayed oral transit;Lingual pumping;Premature spillage Oral - Nectar Teaspoon Delayed oral transit;Decreased bolus cohesion;Lingual pumping Oral - Nectar Cup Decreased bolus cohesion;Lingual pumping Oral - Thin Teaspoon Lingual pumping Oral - Thin Cup Delayed oral transit;Decreased bolus cohesion;Lingual/palatal residue Oral - Thin Straw Delayed oral transit Oral - Puree Premature spillage;Decreased bolus cohesion;Delayed oral transit;Lingual pumping;Lingual/palatal residue Oral - Mech Soft Weak lingual manipulation;Impaired mastication;Lingual/palatal residue;Delayed oral transit    12/05/2022  11:11 AM Pharyngeal Phase Pharyngeal Phase Impaired Pharyngeal- Honey Teaspoon Delayed swallow initiation-pyriform sinuses;WFL Pharyngeal Material does not enter airway Pharyngeal- Nectar Teaspoon WFL;Reduced epiglottic inversion Pharyngeal Material does not enter airway Pharyngeal- Nectar Cup [facility] Pharyngeal Material does not enter airway Pharyngeal- Thin Teaspoon WFL;Delayed swallow initiation-vallecula Pharyngeal Material does not enter airway Pharyngeal- Thin Cup [facility]  Pharyngeal Material does not enter airway Pharyngeal- Thin Straw WFL Pharyngeal Material does not enter airway Pharyngeal- Puree Delayed swallow initiation-pyriform sinuses Pharyngeal Material does not enter airway Pharyngeal Material does not enter airway Pharyngeal Comment 12 second delay in swallow to swallow pudding retention    12/05/2022  11:14 AM Cervical Esophageal Phase  Cervical Esophageal Phase WFL Kathleen Lime, MS [facility] SLP Acute Rehab Services Office [phone] Macario Golds 12/05/2022, 11:32 AM                     IR GASTROSTOMY TUBE MOD SED  Result Date: 12/03/2022 INDICATION: Dysphagia. EXAM: PERCUTANEOUS GASTROSTOMY TUBE PLACEMENT COMPARISON:  CT [facility], 11/30/2022. MEDICATIONS: Ancef 2 gm IV; Antibiotics were administered within 1 hour of the procedure. Glucagon 0.5 mg IV. CONTRAST:  20 mL of Isovue 300 administered into the gastric lumen. ANESTHESIA/SEDATION: Moderate (conscious) sedation was employed during this procedure. A total of Versed 2 mg and Fentanyl 100 mcg was administered intravenously. Moderate Sedation Time: 12 minutes. The patient's level of consciousness and vital signs were monitored continuously by radiology nursing throughout the procedure under my direct supervision. FLUOROSCOPY TIME:  Fluoroscopic dose; 1 mGy COMPLICATIONS: None immediate. PROCEDURE: Informed written consent was obtained from the patient and/or patient's representative following explanation of the procedure, risks, benefits  and alternatives. A time out was performed prior to the initiation of the procedure. Maximal barrier sterile technique utilized including caps, mask, sterile gowns, sterile gloves, large sterile drape, hand hygiene and Betadine prep. The LEFT upper quadrant was sterilely prepped and draped. A oral gastric catheter was inserted into the stomach under fluoroscopy. The existing nasogastric feeding tube was removed. The left costal margin and barium opacified transverse colon were identified and  avoided. Air was injected into the stomach for insufflation and visualization under fluoroscopy. Under sterile conditions and local anesthesia, 2 T tacks were utilized to pexy the anterior aspect of the stomach against the ventral abdominal wall. Contrast injection confirmed appropriate positioning of each of the T tacks. An incision was made between the T tacks and a 17 gauge trocar needle was utilized to access the stomach. Needle position was confirmed within the stomach with aspiration of air and injection of a small amount of contrast. A stiff Glidewire was advanced into the gastric lumen and under intermittent fluoroscopic guidance and a telescoping peel-away sheath was inserted, ultimately allowing placement of a 18 Fr balloon retention gastrostomy tube. The retention balloon was insufflated with a mixture of dilute saline and contrast and pulled taut against the anterior wall of the stomach. The external disc was cinched. Contrast injection confirms positioning within the stomach. Several spot radiographic images were obtained in various obliquities for documentation. T he patient tolerated procedure well without immediate post procedural complication. FINDINGS: After successful fluoroscopic guided placement, the gastrostomy tube is appropriately positioned with internal retention balloon against the ventral aspect of the gastric lumen. IMPRESSION: Successful fluoroscopic insertion of an 18 Fr balloon retention gastrostomy tube. PLAN: *The gastrostomy may be used for medication administration within 6 hours of placement, and in 24 hrs for the initiation of feeds. * The patient will return to Vascular Interventional Radiology (VIR) for routine feeding tube evaluation and exchange in 6 months. Michaelle Birks, MD [facility] Radiology Electronically Signed   By: Michaelle Birks M.D.   On: 12/03/2022 18:06   DG Shoulder Left Port  Result Date: 12/01/2022 CLINICAL  DATA:  Pain EXAM: LEFT SHOULDER COMPARISON:  None Available. FINDINGS: Bones are mildly osteopenic. There is no acute fracture or dislocation identified. Joint spaces are maintained. Soft tissues are within normal limits. IMPRESSION: 1. No acute fracture or dislocation identified. 2. Mild osteopenia. Electronically Signed   By: Ronney Asters M.D.   On: 12/01/2022 19:39   MR BRAIN WO CONTRAST  Result Date: 12/01/2022 CLINICAL DATA:  Neuro deficit, acute, stroke suspected dysphagia, prior CVA EXAM: MRI HEAD WITHOUT CONTRAST TECHNIQUE: Multiplanar, multiecho pulse sequences of the brain and surrounding structures were obtained without intravenous contrast. COMPARISON:  MRI head 07/26/2022. FINDINGS: Brain: Remote hemorrhagic superior right cerebellar infarct, similar. Remote perforator infarcts in the right basal ganglia and bilateral remote thalamic infarcts. Additional patchy T2/FLAIR hyperintensity within the white matter, compatible with chronic microvascular ischemic disease. No evidence of acute infarct, acute hemorrhage, midline shift, hydrocephalus or extra-axial fluid collection. Vascular: Major arterial flow voids are maintained at the skull base. Skull and upper cervical spine: Normal marrow signal. Sinuses/Orbits: Largely clear sinuses.  No acute orbital findings. Other: No mastoid effusions. IMPRESSION: 1. No evidence of acute intracranial abnormality. 2. Infratentorial and supratentorial remote infarcts, as above. Electronically Signed   By: Margaretha Sheffield M.D.   On: 12/01/2022 17:22   DG Abd 1 View  Result Date: 12/01/2022 CLINICAL DATA:  NG tube placement. EXAM: ABDOMEN -  1 VIEW COMPARISON:  Earlier same day FINDINGS: 1622 hours. NG tube is looped in the stomach with proximal side port well below the GE junction. Nonspecific bowel gas pattern with retrocardiac left base atelectasis/infiltrate noted. IMPRESSION: NG tube is looped in the stomach with proximal side port well below the GE  junction. Electronically Signed   By: Misty Stanley M.D.   On: 12/01/2022 17:12   DG Abd 1 View  Result Date: 12/01/2022 CLINICAL DATA:  Nausea, nasogastric tube placement EXAM: ABDOMEN - 1 VIEW COMPARISON:  Portable exam 1402 hours compared to 11/30/2022 FINDINGS: No nasogastric tube identified. Atherosclerotic calcification aorta. Subsegmental atelectasis both lung bases. Nonobstructive bowel gas pattern. Retained contrast RIGHT colon. IMPRESSION: No nasogastric tube identified. Aortic Atherosclerosis (ICD10-I70.0). Findings called to Roj RN on [place] on 12/01/2022 at 1500 hours. Electronically Signed   By: Lavonia Dana M.D.   On: 12/01/2022 15:01   DG Abd 1 View  Result Date: 11/30/2022 CLINICAL DATA:  Nasogastric tube placement. EXAM: ABDOMEN - 1 VIEW COMPARISON:  January 29, 2022 FINDINGS: Nasogastric tube is seen with its distal tip overlying the body of the stomach. Its distal side hole sits just beyond the expected region of the gastroesophageal junction. The bowel gas pattern is normal. Radiopaque contrast is seen within the ascending colon. No radio-opaque calculi are seen. IMPRESSION: Nasogastric tube positioning, as described above. Electronically Signed   By: Virgina Norfolk M.D.   On: 11/30/2022 19:47   DG ESOPHAGUS W SINGLE CM (SOL OR THIN BA)  Result Date: 11/30/2022 CLINICAL DATA:  Decreased oral intake, history of CVA, odynophagia EXAM: ESOPHAGUS/BARIUM SWALLOW/TABLET STUDY TECHNIQUE: Significantly limited single contrast examination was performed using thin liquid barium. This exam was performed by Pasty Spillers, PA-C, and was supervised and interpreted by Salvatore Marvel, MD. FLUOROSCOPY: Radiation Exposure Index (as provided by the fluoroscopic device): 3.10 mGy Kerma COMPARISON:  Swallow function from 07/27/22. FINDINGS: Three small sips of barium were consumed, limiting assessment. There is delay in the oral phase of swallow. Suggestion of transient laryngeal penetration on the limited  swallows. No gross tracheal aspiration. Normal caliber esophagus with no gross esophageal abnormality appreciated on very limited views. IMPRESSION: Grossly nondiagnostic evaluation with minimal barium consumed. Noted is a delay in the oral phase of swallowing. Suggestion of transient laryngeal penetration. No gross tracheal aspiration. Electronically Signed   By: Ilona Sorrel M.D.   On: 11/30/2022 09:53   CT ABDOMEN PELVIS WO CONTRAST  Result Date: 11/30/2022 CLINICAL DATA:  Abdominal pain EXAM: CT ABDOMEN AND PELVIS WITHOUT CONTRAST TECHNIQUE: Multidetector CT imaging of the abdomen and pelvis was performed following the standard protocol without IV contrast. RADIATION DOSE REDUCTION: This exam was performed according to the departmental dose-optimization program which includes automated exposure control, adjustment of the mA and/or kV according to patient size and/or use of iterative reconstruction technique. COMPARISON:  05/06/2013 FINDINGS: Lower chest: Dependent bibasilar subsegmental atelectasis or scarring. No pleural or pericardial effusion. Hepatobiliary: No focal abnormalities identified in the liver without contrast. The gallbladder is distended. Pancreas: Unremarkable. No pancreatic ductal dilatation or surrounding inflammatory changes. Spleen: Normal in size without focal abnormality. Adrenals/Urinary Tract: No adrenal lesions identified. Mm sized midpole right-sided renal stone with no hydronephrosis. No stones in the ureters. Unremarkable urinary bladder. Stomach/Bowel: Stomach is within normal limits. Appendix not visualized and there is no evidence of appendicitis. No evidence of bowel wall thickening, distention, or inflammatory changes. Vascular/Lymphatic: Aortic atherosclerosis. No enlarged abdominal or pelvic lymph nodes. Reproductive: Uterus and bilateral adnexa  are unremarkable. Other: No abdominal wall hernia or abnormality. No abdominopelvic ascites. Musculoskeletal: No acute or  significant osseous findings. IMPRESSION: 1. Distended gallbladder. 2. Nonobstructing punctate stone right kidney. 3. No acute abdominal or pelvic pathology identified. Electronically Signed   By: Sammie Bench M.D.   On: 11/30/2022 09:26   DG Chest Portable 1 View  Result Date: 11/29/2022 CLINICAL DATA:  dysphagia EXAM: PORTABLE CHEST 1 VIEW COMPARISON:  August 31, 2014 FINDINGS: The cardiomediastinal silhouette is unchanged in contour.Atherosclerotic calcifications. No pleural effusion. No pneumothorax. No acute pleuroparenchymal abnormality. IMPRESSION: No acute cardiopulmonary abnormality. Electronically Signed   By: Valentino Saxon M.D.   On: 11/29/2022 18:15      Subjective: Patient seen and examined at bedside today.  Hemodynamically stable for discharge.  Complain of some discomfort around the periumbilical area.  Abdomen examined, it was benign: No tenderness, bowel sounds present, soft.  Medically stable for discharge.called and discussed with daughter on phone about discharge planning  Discharge Exam: Vitals:   12/07/22 1047 12/07/22 1100  BP: 94/79 106/64  Pulse: 94   Resp:    Temp:    SpO2: 100%    Vitals:   12/06/22 2218 12/07/22 0530 12/07/22 1047 12/07/22 1100  BP: (!) 90/59 (!) 82/72 94/79 106/64  Pulse: 88 98 94   Resp: 17 17    Temp: 99 F (37.2 C) 98.9 F (37.2 C)    TempSrc: Oral Oral    SpO2: 98% 99% 100%   Weight:      Height:        General: Pt is alert, awake, not in acute distress, deconditioned Cardiovascular: RRR, S1/S2 +, no rubs, no gallops Respiratory: CTA bilaterally, no wheezing, no rhonchi Abdominal: Soft, NT, ND, bowel sounds +, G-tube Extremities: no edema, no cyanosis, left hemiplegia    The results of significant diagnostics from this hospitalization (including imaging, microbiology, ancillary and laboratory) are listed below for reference.     Microbiology: Recent Results (from the past 240 hour(s))  Urine Culture      Status: Abnormal   Collection Time: 11/30/22 10:02 PM   Specimen: Urine, Clean Catch  Result Value Ref Range Status   Specimen Description   Final    URINE, CLEAN CATCH Performed at [facility], [place] [address], [place], [place] [address]    Special Requests   Final    NONE Performed at [facility], [place] [address], [place], [place] [address]    Culture >=100,000 COLONIES/mL ESCHERICHIA COLI (A)  Final   Report Status 12/04/2022 FINAL  Final   Organism ID, Bacteria ESCHERICHIA COLI (A)  Final      Susceptibility   Escherichia coli - MIC*    AMPICILLIN 4 SENSITIVE Sensitive     CEFAZOLIN <=4 SENSITIVE Sensitive     CEFEPIME <=0.12 SENSITIVE Sensitive     CEFTRIAXONE <=0.25 SENSITIVE Sensitive     CIPROFLOXACIN <=0.25 SENSITIVE Sensitive     GENTAMICIN <=1 SENSITIVE Sensitive     IMIPENEM <=0.25 SENSITIVE Sensitive     NITROFURANTOIN <=16 SENSITIVE Sensitive     TRIMETH/SULFA <=20 SENSITIVE Sensitive     AMPICILLIN/SULBACTAM <=2 SENSITIVE Sensitive     PIP/TAZO <=4 SENSITIVE Sensitive     * >=100,000 COLONIES/mL ESCHERICHIA COLI     Labs: BNP (last 3 results) Recent Labs    11/29/22 1800  BNP 05.3   Basic Metabolic Panel: Recent Labs  Lab 12/02/22 0856 12/02/22 1619 12/03/22 1122 12/04/22 9767 12/05/22 3419 12/06/22 0636  NA 139  --  141 140 137 139  K 4.4  --  4.0 3.8 3.6 4.0  CL 108  --  108 107 105 106  CO2 24  --  '27 26 25 25  '$ GLUCOSE 103*  --  102* 92 97 93  BUN <5*  --  <5* <5* <5* 7*  CREATININE 0.69  --  0.73 0.76 0.72 0.61  CALCIUM 8.1*  --  8.5* 8.3* 8.1* 8.4*  MG 1.9 1.8 1.6* 1.5* 1.6* 1.8  PHOS 3.0 3.5 3.7 3.8 2.9 3.2   Liver Function Tests: Recent Labs  Lab 12/04/22 0651 12/05/22 0629 12/06/22 0636  AST '25 21 22  '$ ALT '15 12 9  '$ ALKPHOS 62 62 69  BILITOT 0.8 0.4 0.3  PROT 4.9* 4.8* 5.2*  ALBUMIN 2.4* 2.3* 2.5*   No results for input(s): "LIPASE", "AMYLASE" in the last 168 hours. No results for  input(s): "AMMONIA" in the last 168 hours. CBC: No results for input(s): "WBC", "NEUTROABS", "HGB", "HCT", "MCV", "PLT" in the last 168 hours. Cardiac Enzymes: No results for input(s): "CKTOTAL", "CKMB", "CKMBINDEX", "TROPONINI" in the last 168 hours. BNP: Invalid input(s): "POCBNP" CBG: Recent Labs  Lab 12/06/22 1639 12/06/22 2220 12/07/22 0116 12/07/22 0544 12/07/22 0748  GLUCAP 131* 104* 105* 114* 127*   D-Dimer No results for input(s): "DDIMER" in the last 72 hours. Hgb A1c No results for input(s): "HGBA1C" in the last 72 hours. Lipid Profile No results for input(s): "CHOL", "HDL", "LDLCALC", "TRIG", "CHOLHDL", "LDLDIRECT" in the last 72 hours. Thyroid function studies No results for input(s): "TSH", "T4TOTAL", "T3FREE", "THYROIDAB" in the last 72 hours.  Invalid input(s): "FREET3" Anemia work up No results for input(s): "VITAMINB12", "FOLATE", "FERRITIN", "TIBC", "IRON", "RETICCTPCT" in the last 72 hours. Urinalysis    Component Value Date/Time   COLORURINE AMBER (A) 11/30/2022 0509   APPEARANCEUR CLOUDY (A) 11/30/2022 0509   LABSPEC 1.024 11/30/2022 0509   PHURINE 5.0 11/30/2022 0509   GLUCOSEU NEGATIVE 11/30/2022 0509   HGBUR MODERATE (A) 11/30/2022 0509   BILIRUBINUR SMALL (A) 11/30/2022 0509   BILIRUBINUR small 06/15/2014 1038   KETONESUR 20 (A) 11/30/2022 0509   PROTEINUR 30 (A) 11/30/2022 0509   UROBILINOGEN 0.2 06/15/2014 1038   UROBILINOGEN 1.0 05/04/2013 1637   NITRITE NEGATIVE 11/30/2022 0509   LEUKOCYTESUR MODERATE (A) 11/30/2022 0509   Sepsis Labs No results for input(s): "WBC" in the last 168 hours.  Invalid input(s): "PROCALCITONIN", "LACTICIDVEN" Microbiology Recent Results (from the past 240 hour(s))  Urine Culture     Status: Abnormal   Collection Time: 11/30/22 10:02 PM   Specimen: Urine, Clean Catch  Result Value Ref Range Status   Specimen Description   Final    URINE, CLEAN CATCH Performed at [facility], [place]  [address] [place],  [address]    Special Requests   Final    NONE Performed at [facility], [place] [address], [place], [place] [address]    Culture >=100,000 COLONIES/mL ESCHERICHIA COLI (A)  Final   Report Status 12/04/2022 FINAL  Final   Organism ID, Bacteria ESCHERICHIA COLI (A)  Final      Susceptibility   Escherichia coli - MIC*    AMPICILLIN 4 SENSITIVE Sensitive     CEFAZOLIN <=4 SENSITIVE Sensitive     CEFEPIME <=0.12 SENSITIVE Sensitive     CEFTRIAXONE <=0.25 SENSITIVE Sensitive     CIPROFLOXACIN <=0.25 SENSITIVE Sensitive     GENTAMICIN <=1 SENSITIVE Sensitive     IMIPENEM <=0.25  SENSITIVE Sensitive     NITROFURANTOIN <=16 SENSITIVE Sensitive     TRIMETH/SULFA <=20 SENSITIVE Sensitive     AMPICILLIN/SULBACTAM <=2 SENSITIVE Sensitive     PIP/TAZO <=4 SENSITIVE Sensitive     * >=100,000 COLONIES/mL ESCHERICHIA COLI    Please note: You were cared for by a hospitalist during your hospital stay. Once you are discharged, your primary care physician will handle any further medical issues. Please note that NO REFILLS for any discharge medications will be authorized once you are discharged, as it is imperative that you return to your primary care physician (or establish a relationship with a primary care physician if you do not have one) for your post hospital discharge needs so that they can reassess your need for medications and monitor your lab values.    Time coordinating discharge: 40 minutes  SIGNED:   Shelly Coss, MD  [facility] 12/07/2022, 11:37 AM Pager [phone]  If 7PM-7AM, please contact night-coverage [email or web address] Password [email or web address]

## 2022-12-08 ENCOUNTER — Inpatient Hospital Stay (HOSPITAL_COMMUNITY): Payer: Medicare HMO

## 2022-12-08 DIAGNOSIS — I69391 Dysphagia following cerebral infarction: Secondary | ICD-10-CM | POA: Diagnosis not present

## 2022-12-08 LAB — GLUCOSE, CAPILLARY
Glucose-Capillary: 102 mg/dL — ABNORMAL HIGH (ref 70–99)
Glucose-Capillary: 111 mg/dL — ABNORMAL HIGH (ref 70–99)
Glucose-Capillary: 119 mg/dL — ABNORMAL HIGH (ref 70–99)
Glucose-Capillary: 131 mg/dL — ABNORMAL HIGH (ref 70–99)
Glucose-Capillary: 84 mg/dL (ref 70–99)

## 2022-12-08 LAB — COMPREHENSIVE METABOLIC PANEL
ALT: 212 U/L — ABNORMAL HIGH (ref 0–44)
AST: 359 U/L — ABNORMAL HIGH (ref 15–41)
Albumin: 3 g/dL — ABNORMAL LOW (ref 3.5–5.0)
Alkaline Phosphatase: 258 U/L — ABNORMAL HIGH (ref 38–126)
Anion gap: 10 (ref 5–15)
BUN: 9 mg/dL (ref 8–23)
CO2: 27 mmol/L (ref 22–32)
Calcium: 8.8 mg/dL — ABNORMAL LOW (ref 8.9–10.3)
Chloride: 99 mmol/L (ref 98–111)
Creatinine, Ser: 0.74 mg/dL (ref 0.44–1.00)
GFR, Estimated: >60 mL/min (ref >60)
Glucose, Bld: 109 mg/dL — ABNORMAL HIGH (ref 70–99)
Potassium: 4.1 mmol/L (ref 3.5–5.1)
Sodium: 136 mmol/L (ref 135–145)
Total Bilirubin: 1.7 mg/dL — ABNORMAL HIGH (ref 0.3–1.2)
Total Protein: 6.2 g/dL — ABNORMAL LOW (ref 6.5–8.1)

## 2022-12-08 MED ORDER — ONDANSETRON HCL 4 MG/2ML IJ SOLN: 4.0000 mg | Freq: Four times a day (QID) | INTRAMUSCULAR | Status: DC | PRN

## 2022-12-08 MED ORDER — LORAZEPAM 2 MG/ML IJ SOLN
0.2500 mg | Freq: Once | INTRAMUSCULAR | Status: AC
  Administered 2022-12-08: 0.25 mg via INTRAVENOUS
  Filled 2022-12-08: qty 1

## 2022-12-08 MED ORDER — IOHEXOL 300 MG/ML  SOLN: 100.0000 mL | Freq: Once | INTRAMUSCULAR | Status: DC | PRN

## 2022-12-08 MED ORDER — PIPERACILLIN-TAZOBACTAM 3.375 G IVPB
3.3750 g | Freq: Three times a day (TID) | INTRAVENOUS | Status: DC
  Administered 2022-12-08 – 2022-12-11 (×8): 3.375 g via INTRAVENOUS
  Filled 2022-12-08 (×8): qty 50

## 2022-12-08 MED ORDER — IOHEXOL 9 MG/ML PO SOLN
500.0000 mL | ORAL | Status: AC
  Administered 2022-12-08: 120 mL via ORAL
  Administered 2022-12-08: 500 mL via ORAL

## 2022-12-08 MED ORDER — IOHEXOL 9 MG/ML PO SOLN
500.0000 mL | ORAL | Status: AC
  Administered 2022-12-08: 500 mL via ORAL

## 2022-12-08 NOTE — Consult Note (Signed)
Reason for Consult:abdominal pain Referring Provider: Shelly Coss  Kara Bailey is an 67 y.o. female.  HPI: 67 yo female with history of CVA presented for dysphagia and underwent recent IR placed G tube. She has since developed nausea, vomiting and abdominal pain. Pain is upper middle of abdomen.  Past Medical History:  Diagnosis Date   Acute CVA (cerebrovascular accident) ([place]) 12/29/2020   Anxiety    Chest pain 08/2014   unspecified   Cirrhosis ([place])    CVA (cerebral vascular accident) ([place]) 07/27/2022   Depression    Dizziness 08/2014   Dyspnea 08/2014   Dysrhythmia    Emphysema lung ([facility])    GERD (gastroesophageal reflux disease)    Heart murmur    Hepatitis C    Hiatal hernia    Hypertension    Ischemic stroke ([place]) 08/01/2022   PAC (premature atrial contraction) 10/11/2014   PAD (peripheral artery disease) (H[facility])    Palpitations    PVC (premature ventricular contraction) 10/11/2014   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   Stroke ([place])    just slurred speech   Tachycardia 08/2014   Ventral hernia    Weakness 08/2014    Past Surgical History:  Procedure Laterality Date   ABDOMINAL AORTOGRAM W/LOWER EXTREMITY N/A 07/13/2021   Procedure: ABDOMINAL AORTOGRAM W/LOWER EXTREMITY;  Surgeon: Lorretta Harp, MD;  Location: [facility];  Service: Cardiovascular;  Laterality: N/A;   ABDOMINAL AORTOGRAM W/LOWER EXTREMITY N/A 03/19/2022   Procedure: ABDOMINAL AORTOGRAM W/LOWER EXTREMITY;  Surgeon: Lorretta Harp, MD;  Location: [facility];  Service: Cardiovascular;  Laterality: N/A;   APPENDECTOMY     CATARACT EXTRACTION, BILATERAL     COLONOSCOPY     COLONOSCOPY WITH PROPOFOL N/A 04/30/2016   Procedure: COLONOSCOPY WITH PROPOFOL;  Surgeon: Daneil Dolin, MD;  [facility];  Service: Endoscopy;  Laterality: N/A;  E. Lopez N/A 10/13/2021   Procedure: OPEN REPAIR EPIGASTRIC HERNIA WITH MESH PATCH;   Surgeon: Armandina Gemma, MD;  Location: [facility];  Service: General;  Laterality: N/A;   ESOPHAGOGASTRODUODENOSCOPY     approximately 2010   ESOPHAGOGASTRODUODENOSCOPY (EGD) WITH PROPOFOL N/A 04/30/2016   Procedure: ESOPHAGOGASTRODUODENOSCOPY (EGD) WITH PROPOFOL;  Surgeon: Daneil Dolin, MD;  [facility];  Service: Endoscopy;  Laterality: N/A;   IR GASTROSTOMY TUBE MOD SED  12/03/2022   LEFT HEART CATHETERIZATION WITH CORONARY ANGIOGRAM N/A 10/29/2014   07-14-20- pt denies this Procedure: LEFT HEART CATHETERIZATION WITH CORONARY ANGIOGRAM;  Surgeon: Burnell Blanks, MD;  Location: [facility];  Service: Cardiovascular;  Laterality: N/A;   MALONEY DILATION N/A 04/30/2016   Procedure: Venia Minks DILATION;  Surgeon: Daneil Dolin, MD;  [facility];  Service: Endoscopy;  Laterality: N/A;   PERIPHERAL VASCULAR INTERVENTION  07/13/2021   Procedure: PERIPHERAL VASCULAR INTERVENTION;  Surgeon: Lorretta Harp, MD;  Location: [place] [facility];  Service: Cardiovascular;;  left SFA left external iliac   PERIPHERAL VASCULAR INTERVENTION Right 03/19/2022   Procedure: PERIPHERAL VASCULAR INTERVENTION;  Surgeon: Lorretta Harp, MD;  Location: [place] [facility];  Service: Cardiovascular;  Laterality: Right;  SFA   POLYPECTOMY  04/30/2016   Procedure: POLYPECTOMY;  Surgeon: Daneil Dolin, MD;  [facility];  Service: Endoscopy;;  Sigmoid colon polyp removed via hot snare   UPPER GASTROINTESTINAL ENDOSCOPY      Family History  Problem Relation Age of Onset   Heart failure Mother  Heart attack Mother    Heart disease Mother    Hypertension Mother    Hyperlipidemia Mother    CVA Father    Alzheimer's disease Father    Cancer Neg Hx    Colon cancer Neg Hx    Esophageal cancer Neg Hx    Rectal cancer Neg Hx    Stomach cancer Neg Hx     Social History:  reports that she has been smoking cigarettes. She started smoking about 54 years ago. She has a 20.00 pack-year  smoking history. She has never used smokeless tobacco. She reports that she does not currently use alcohol. She reports that she does not use drugs.  Allergies: No Known Allergies  Medications: I have reviewed the patient's current medications.  Results for orders placed or performed during the hospital encounter of 11/29/22 (from the past 48 hour(s))  Glucose, capillary     Status: Abnormal   Collection Time: 12/06/22 10:20 PM  Result Value Ref Range   Glucose-Capillary 104 (H) 70 - 99 mg/dL    Comment: Glucose reference range applies only to samples taken after fasting for at least 8 hours.   Comment 1 Notify RN    Comment 2 Document in Chart   Glucose, capillary     Status: Abnormal   Collection Time: 12/07/22  1:16 AM  Result Value Ref Range   Glucose-Capillary 105 (H) 70 - 99 mg/dL    Comment: Glucose reference range applies only to samples taken after fasting for at least 8 hours.   Comment 1 Notify RN    Comment 2 Document in Chart   Glucose, capillary     Status: Abnormal   Collection Time: 12/07/22  5:44 AM  Result Value Ref Range   Glucose-Capillary 114 (H) 70 - 99 mg/dL    Comment: Glucose reference range applies only to samples taken after fasting for at least 8 hours.   Comment 1 Notify RN    Comment 2 Document in Chart   Glucose, capillary     Status: Abnormal   Collection Time: 12/07/22  7:48 AM  Result Value Ref Range   Glucose-Capillary 127 (H) 70 - 99 mg/dL    Comment: Glucose reference range applies only to samples taken after fasting for at least 8 hours.  Glucose, capillary     Status: Abnormal   Collection Time: 12/07/22 11:48 AM  Result Value Ref Range   Glucose-Capillary 100 (H) 70 - 99 mg/dL    Comment: Glucose reference range applies only to samples taken after fasting for at least 8 hours.  Glucose, capillary     Status: Abnormal   Collection Time: 12/07/22  4:29 PM  Result Value Ref Range   Glucose-Capillary 114 (H) 70 - 99 mg/dL    Comment:  Glucose reference range applies only to samples taken after fasting for at least 8 hours.  Glucose, capillary     Status: None   Collection Time: 12/07/22  8:29 PM  Result Value Ref Range   Glucose-Capillary 97 70 - 99 mg/dL    Comment: Glucose reference range applies only to samples taken after fasting for at least 8 hours.   Comment 1 Notify RN    Comment 2 Document in Chart   Glucose, capillary     Status: Abnormal   Collection Time: 12/08/22 12:23 AM  Result Value Ref Range   Glucose-Capillary 131 (H) 70 - 99 mg/dL    Comment: Glucose reference range applies only to samples taken after fasting  for at least 8 hours.   Comment 1 Notify RN    Comment 2 Document in Chart   Glucose, capillary     Status: Abnormal   Collection Time: 12/08/22  3:51 AM  Result Value Ref Range   Glucose-Capillary 111 (H) 70 - 99 mg/dL    Comment: Glucose reference range applies only to samples taken after fasting for at least 8 hours.   Comment 1 Notify RN    Comment 2 Document in Chart   Glucose, capillary     Status: Abnormal   Collection Time: 12/08/22  7:32 AM  Result Value Ref Range   Glucose-Capillary 119 (H) 70 - 99 mg/dL    Comment: Glucose reference range applies only to samples taken after fasting for at least 8 hours.  Comprehensive metabolic panel     Status: Abnormal   Collection Time: 12/08/22  2:49 PM  Result Value Ref Range   Sodium 136 135 - 145 mmol/L   Potassium 4.1 3.5 - 5.1 mmol/L   Chloride 99 98 - 111 mmol/L   CO2 27 22 - 32 mmol/L   Glucose, Bld 109 (H) 70 - 99 mg/dL    Comment: Glucose reference range applies only to samples taken after fasting for at least 8 hours.   BUN 9 8 - 23 mg/dL   Creatinine, Ser 0.74 0.44 - 1.00 mg/dL   Calcium 8.8 (L) 8.9 - 10.3 mg/dL   Total Protein 6.2 (L) 6.5 - 8.1 g/dL   Albumin 3.0 (L) 3.5 - 5.0 g/dL   AST 359 (H) 15 - 41 U/L   ALT 212 (H) 0 - 44 U/L   Alkaline Phosphatase 258 (H) 38 - 126 U/L   Total Bilirubin 1.7 (H) 0.3 - 1.2 mg/dL    GFR, Estimated >60 >60 mL/min    Comment: (NOTE) Calculated using the CKD-EPI Creatinine Equation (2021)    Anion gap 10 5 - 15    Comment: Performed at [facility], [place] [address] [place], [place] [address]  Glucose, capillary     Status: Abnormal   Collection Time: 12/08/22  5:01 PM  Result Value Ref Range   Glucose-Capillary 102 (H) 70 - 99 mg/dL    Comment: Glucose reference range applies only to samples taken after fasting for at least 8 hours.    MR ABDOMEN MRCP WO CONTRAST  Result Date: 12/08/2022 CLINICAL DATA:  Concern for cholecystitis with abnormal biliary ductal dilation EXAM: MRI ABDOMEN WITHOUT CONTRAST  (INCLUDING MRCP) TECHNIQUE: Multiplanar multisequence MR imaging of the abdomen was performed. Heavily T2-weighted images of the biliary and pancreatic ducts were obtained, and three-dimensional MRCP images were rendered by post processing. COMPARISON:  CT December 08, 2022 FINDINGS: Despite efforts by the technologist and patient, motion artifact is present on today's exam and could not be eliminated. This reduces exam sensitivity and specificity. Lower chest: Limited evaluation of the lungs reveals no acute abnormality. Hepatobiliary: No significant hepatic steatosis. T2 hyperintense 15 x 12 mm lesion in the lateral left lobe of the liver on image 10/5 demonstrates a few thin internal septations, incompletely evaluated without intravenous contrast material but demonstrated marked enhancement on contrast enhanced CT from same day. Gallbladder is distended with pericholecystic fluid. Intra and extrahepatic biliary ductal dilation to the level of the ampulla with the common duct measuring 13 mm. No choledocholithiasis or discrete obstructing mass identified. Pancreas: No pancreatic ductal dilation or evidence of acute inflammation. Spleen:  No splenomegaly. Adrenals/Urinary Tract: Bilateral adrenal glands appear  normal. No hydronephrosis. Stomach/Bowel: Percutaneous  gastrostomy tube with retention balloon in the stomach. Vascular/Lymphatic: Normal caliber abdominal aorta. No pathologically enlarged abdominal lymph nodes. Other:  None. Musculoskeletal: No suspicious bone lesions identified. IMPRESSION: 1. Gallbladder is distended with wall thickening and mild pericholecystic fluid, concerning for acute cholecystitis. 2. Intra and extrahepatic biliary ductal dilation to the level of the ampulla with the common duct measuring 13 mm without discrete obstructive etiology identified, suggest correlation with laboratory values (T bili) to assess for biliary obstruction and consider further evaluation with EUS to evaluate for biliary stricture versus MRI occult lesion. 3. T2 hyperintense 15 x 12 mm lesion in the lateral left lobe of the liver on image 10/5 demonstrates a few thin internal septations, incompletely evaluated without intravenous contrast material on this examination but on same day contrast enhanced CT demonstrated marked enhancement. Imaging characteristics between the 2 examinations are most compatible with a benign hepatic hemangioma. Attention on follow-up imaging suggested. Electronically Signed   By: Dahlia Bailiff M.D.   On: 12/08/2022 19:06   MR 3D Recon At Scanner  Result Date: 12/08/2022 CLINICAL DATA:  Concern for cholecystitis with abnormal biliary ductal dilation EXAM: MRI ABDOMEN WITHOUT CONTRAST  (INCLUDING MRCP) TECHNIQUE: Multiplanar multisequence MR imaging of the abdomen was performed. Heavily T2-weighted images of the biliary and pancreatic ducts were obtained, and three-dimensional MRCP images were rendered by post processing. COMPARISON:  CT December 08, 2022 FINDINGS: Despite efforts by the technologist and patient, motion artifact is present on today's exam and could not be eliminated. This reduces exam sensitivity and specificity. Lower chest: Limited evaluation of the lungs reveals no acute abnormality. Hepatobiliary: No significant hepatic  steatosis. T2 hyperintense 15 x 12 mm lesion in the lateral left lobe of the liver on image 10/5 demonstrates a few thin internal septations, incompletely evaluated without intravenous contrast material but demonstrated marked enhancement on contrast enhanced CT from same day. Gallbladder is distended with pericholecystic fluid. Intra and extrahepatic biliary ductal dilation to the level of the ampulla with the common duct measuring 13 mm. No choledocholithiasis or discrete obstructing mass identified. Pancreas: No pancreatic ductal dilation or evidence of acute inflammation. Spleen:  No splenomegaly. Adrenals/Urinary Tract: Bilateral adrenal glands appear normal. No hydronephrosis. Stomach/Bowel: Percutaneous gastrostomy tube with retention balloon in the stomach. Vascular/Lymphatic: Normal caliber abdominal aorta. No pathologically enlarged abdominal lymph nodes. Other:  None. Musculoskeletal: No suspicious bone lesions identified. IMPRESSION: 1. Gallbladder is distended with wall thickening and mild pericholecystic fluid, concerning for acute cholecystitis. 2. Intra and extrahepatic biliary ductal dilation to the level of the ampulla with the common duct measuring 13 mm without discrete obstructive etiology identified, suggest correlation with laboratory values (T bili) to assess for biliary obstruction and consider further evaluation with EUS to evaluate for biliary stricture versus MRI occult lesion. 3. T2 hyperintense 15 x 12 mm lesion in the lateral left lobe of the liver on image 10/5 demonstrates a few thin internal septations, incompletely evaluated without intravenous contrast material on this examination but on same day contrast enhanced CT demonstrated marked enhancement. Imaging characteristics between the 2 examinations are most compatible with a benign hepatic hemangioma. Attention on follow-up imaging suggested. Electronically Signed   By: Dahlia Bailiff M.D.   On: 12/08/2022 19:06   CT ABDOMEN  PELVIS W CONTRAST  Result Date: 12/08/2022 CLINICAL DATA:  Bowel obstruction suspected.  Dysphagia. EXAM: CT ABDOMEN AND PELVIS WITH CONTRAST TECHNIQUE: Multidetector CT imaging of the abdomen and pelvis was performed using the standard  protocol following bolus administration of intravenous contrast. RADIATION DOSE REDUCTION: This exam was performed according to the departmental dose-optimization program which includes automated exposure control, adjustment of the mA and/or kV according to patient size and/or use of iterative reconstruction technique. CONTRAST:  155m OMNIPAQUE IOHEXOL 300 MG/ML  SOLN COMPARISON:  CT scan of the abdomen and pelvis May 06, 2013. CT scan of the abdomen and pelvis November 30, 2022. FINDINGS: Lower chest: No acute abnormality. Hepatobiliary: Intra and extrahepatic biliary duct dilatation is identified, new compared to May 06, 2013 and not well evaluated on the November 30, 2022 CT scan without contrast. No discrete filling defects are identified in the common bile duct. The gallbladder is markedly distended with no adjacent stranding or obvious stones. Pericholecystic fluid is noted however. Focal fatty deposition adjacent to the falciform ligament. No liver mass identified. Pancreas: Unremarkable. No pancreatic ductal dilatation or surrounding inflammatory changes. Spleen: Normal in size without focal abnormality. Adrenals/Urinary Tract: Adrenal glands are normal. A 3 mm stone is identified in the mid right kidney without hydronephrosis. The kidneys are otherwise normal. The ureters and bladder are unremarkable. Stomach/Bowel: A PEG tube is appropriately located with the balloon and distal tip in the stomach. There is soft tissue thickening with central fat anterior and inferior to the gastric antrum as seen on series 2, images 30 through 35. The finding is similar in appearance to the May 06, 2013 study. The small bowel is decompressed without obvious abnormality. There is  contrast throughout the length of the colon with no colonic abnormality identified. Appendix is not visualized consistent with history of appendectomy. Vascular/Lymphatic: Calcified atherosclerotic changes are identified in the nonaneurysmal aorta, extending into the iliac vessels. No adenopathy. Reproductive: Uterus and bilateral adnexa are unremarkable. Other: No abdominal wall hernia or abnormality. No abdominopelvic ascites. Musculoskeletal: No acute or significant osseous findings. IMPRESSION: 1. Intra and extrahepatic biliary duct dilatation is identified, new compared to May 06, 2013 and not well evaluated on the November 30, 2022 CT scan without contrast. The gallbladder is markedly distended with pericholecystic fluid. There is no adjacent stranding or obvious stones. Recommend an MRCP or ERCP for better evaluation. 2. There is soft tissue thickening with central fat anterior and inferior to the gastric antrum as seen on series 2, images 30 through 35. The finding is similar in appearance to the May 06, 2013 study. The findings could represent sequela of a previous GPhillip Healpatch or previous ulcerative disease. 3. 3 mm nonobstructive stone in the right kidney. 4. Calcified atherosclerotic changes in the nonaneurysmal aorta extending into the iliac vessels. 5. Aortic atherosclerosis. Aortic Atherosclerosis (ICD10-I70.0). Electronically Signed   By: DDorise BullionIII M.D.   On: 12/08/2022 12:08    ROS  PE Blood pressure 113/67, pulse (!) 107, temperature 98.3 F (36.8 C), temperature source Oral, resp. rate 16, height '5\' 6"'$  (1.676 m), weight 53.7 kg, SpO2 99 %. Constitutional: NAD; alert, communicates with hand movements; no deformities Eyes: Moist conjunctiva; no lid lag; anicteric; PERRL Neck: Trachea midline; no thyromegaly Lungs: Normal respiratory effort; no tactile fremitus CV: RRR; no palpable thrills; no pitting edema GI: Abd soft, tender around g tube; no palpable  hepatosplenomegaly MSK: unable to assess gait; no clubbing/cyanosis Psychiatric: Appropriate affect; alert  Lymphatic: No palpable cervical or axillary lymphadenopathy Skin: No major subcutaneous nodules. Warm and dry   Assessment/Plan: 67yo female with dysphagia, recent g tube insertion. Work up concerning for biliary issue with dilated gallbladder and dilated biliary tree, increasing  LFTs, CBC pending. -agree with antibiotics plan -trend LFTs -no stones on imaging, but does fit a pattern of refeeding issue or acalculous cholecystitis, possible GB drain if fails to improve with abx  I reviewed last 24 h vitals and pain scores, last 48 h intake and output, last 24 h labs and trends, and last 24 h imaging results.  This care required high  level of medical decision making.   Arta Bruce Brithney Bensen 12/08/2022, 8:25 PM

## 2022-12-08 NOTE — Progress Notes (Signed)
Hospitalist notified pt have 2 episodes of emesis, tube feed was held at 0550. Pt does not have IV access and was refusing access anticipating discharge. Pt is now agreeable to PIV placement for mediation purposes as she reports nausea and abdominal discomfort. POC reviewed with pt and grad daughter who accompanies her at bedside.

## 2022-12-08 NOTE — TOC Progression Note (Signed)
Transition of Care [facility]) - Progression Note    Patient Details  Name: Kara Bailey MRN: [id number] Date of Birth: 01-19-1956  Transition of Care [facility]) CM/SW [place], LCSW Phone Number: 12/08/2022, 10:01 AM  Clinical Narrative:    Pt's insurance auth still pending. TOC to follow.        Expected Discharge Plan and Services         Expected Discharge Date: 12/07/22                                     Social Determinants of Health (SDOH) Interventions SDOH Screenings   Food Insecurity: Unknown (12/04/2022)  Housing: Low Risk  (12/04/2022)  Transportation Needs: Unknown (12/04/2022)  Utilities: Unknown (12/04/2022)  Depression (PHQ2-9): High Risk (09/19/2022)  Tobacco Use: High Risk (12/03/2022)    Readmission Risk Interventions     No data to display

## 2022-12-08 NOTE — Progress Notes (Signed)
Provider notified that patient is done with MRCP. Bedside nurse instructed to hold tube feeds and PO medications.

## 2022-12-08 NOTE — Progress Notes (Signed)
PROGRESS NOTE  Kara Bailey  LSL:[id number] DOB: 1955-12-24 DOA: 11/29/2022 PCP: Andree Moro, DO   Brief Narrative: Patient is a 66 year old female with history of CVA, hypertension presented the emergency department at the request of her PCP for the evaluation of persistent dysphagia.  Found to have multifactorial dysphagia likely from stroke.  Status post G-tube placement by IR.  PT/OT recommending SNF on discharge.  Developed nausea, vomiting, abdominal pain since 1/26, pending CT abdomen/pelvis  Assessment & Plan:  Principal Problem:   Dysphagia as late effect of cerebrovascular accident (CVA) Active Problems:   Essential hypertension   Left hemiplegia (HCC)   History of stroke   Dysphagia   Protein-calorie malnutrition, severe  Dysphagia: Likely secondary to residual effect of the ischemic stroke.  Has history of esophageal stricture last dilated on 2021.  GI consulted and was following.  Barium swallow done on 11/30/2022 was nondiagnostic.  Underwent G-tube placement by IR on 1/22.  Underwent MBS again  speech therapy to check if she can swallow orally.Started on full liquid diet.  Abdomen pain, nausea, vomiting: Unclear etiology.  Patient complains of periumbilical abdominal pain.  Abdomen is benign on examination: Soft, nondistended, nontender with good bowel sounds.  She has multiple episodes of emesis.  Complains of nausea.  Checking CT scan.  Severe protein calorie malnutrition: Nutrition is following.  BMI of 18.  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  On Plavix, aspirin, Lipitor, Zetia.  Also on baclofen and Zanaflex for left-sided hemiplegia and spasticity  Hypertension: Currently blood pressure stable.  Hypomagnesemia: Continue magnesium supplementation.  Depression: On celexa  Debility/deconditioning: PT/OT recommending SNF on discharge.TOC following      Nutrition Problem: Severe  Malnutrition Etiology: chronic illness, dysphagia (h/o CVA)    DVT prophylaxis:SCDs Start: 11/30/22 0151     Code Status: Full Code  Family Communication: None at bedside  Patient status:Inpatient  Patient is from :Home  Anticipated discharge to:SnF  Estimated DC date: After resolution of abdominal pain, Josem Kaufmann is still pending   Consultants: GI,IR  Procedures:G tube placement   Antimicrobials:  Anti-infectives (From admission, onward)    Start     Dose/Rate Route Frequency Ordered Stop   12/07/22 1130  cefadroxil (DURICEF) capsule 500 mg  Status:  Discontinued        500 mg Oral 2 times daily 12/07/22 1033 12/07/22 1033   12/07/22 1130  cefadroxil (DURICEF) capsule 500 mg  Status:  Discontinued        500 mg Oral 2 times daily 12/07/22 1033 12/07/22 1034   12/07/22 1130  cefadroxil (DURICEF) capsule 500 mg        500 mg Per Tube 2 times daily 12/07/22 1034 12/12/22 0959   12/03/22 0700  ceFAZolin (ANCEF) IVPB 2g/100 mL premix        2 g 200 mL/hr over 30 Minutes Intravenous To Radiology 11/30/22 1602 12/04/22 0700       Subjective: Patient seen and examined at bedside today.  She has developed nausea, vomiting and abdominal pain.  Does not look in distress though.  Hemodynamically stable  Objective: Vitals:   12/07/22 1100 12/07/22 1324 12/07/22 2033 12/08/22 0656  BP: 106/64 108/74 102/73 (!) 143/100  Pulse:  92 (!) 105 (!) 109  Resp:  '18 17 17  '$ Temp:  97.7 F (36.5 C) 98.1 F (36.7 C) 98 F (36.7 C)  TempSrc:  Oral Oral Oral  SpO2:  99% 100% 100%  Weight:  53.7 kg  Height:        Intake/Output Summary (Last 24 hours) at 12/08/2022 1106 Last data filed at 12/08/2022 0550 Gross per 24 hour  Intake --  Output 1650 ml  Net -1650 ml   Filed Weights   12/03/22 0332 12/06/22 0418 12/08/22 0656  Weight: 52.5 kg 53 kg 53.7 kg    Examination:  General exam: Overall comfortable, not in distress, deconditioned HEENT: PERRL Respiratory system:  no  wheezes or crackles  Cardiovascular system: S1 & S2 heard, RRR.  Gastrointestinal system: Abdomen is nondistended, soft and nontender.  G-tube Central nervous system: Alert and oriented Extremities: No edema, no clubbing ,no cyanosis, left hemiplegia Skin: No rashes, no ulcers,no icterus       Data Reviewed: I have personally reviewed following labs and imaging studies  CBC: No results for input(s): "WBC", "NEUTROABS", "HGB", "HCT", "MCV", "PLT" in the last 168 hours.  Basic Metabolic Panel: Recent Labs  Lab 12/02/22 0856 12/02/22 1619 12/03/22 1122 12/04/22 0651 12/05/22 0629 12/06/22 0636  NA 139  --  141 140 137 139  K 4.4  --  4.0 3.8 3.6 4.0  CL 108  --  108 107 105 106  CO2 24  --  '27 26 25 25  '$ GLUCOSE 103*  --  102* 92 97 93  BUN <5*  --  <5* <5* <5* 7*  CREATININE 0.69  --  0.73 0.76 0.72 0.61  CALCIUM 8.1*  --  8.5* 8.3* 8.1* 8.4*  MG 1.9 1.8 1.6* 1.5* 1.6* 1.8  PHOS 3.0 3.5 3.7 3.8 2.9 3.2     Recent Results (from the past 240 hour(s))  Urine Culture     Status: Abnormal   Collection Time: 11/30/22 10:02 PM   Specimen: Urine, Clean Catch  Result Value Ref Range Status   Specimen Description   Final    URINE, CLEAN CATCH Performed at [facility], [place] [address], [place], [place] [address]    Special Requests   Final    NONE Performed at [facility], [place] [address] [place], [place] [address]    Culture >=100,000 COLONIES/mL ESCHERICHIA COLI (A)  Final   Report Status 12/04/2022 FINAL  Final   Organism ID, Bacteria ESCHERICHIA COLI (A)  Final      Susceptibility   Escherichia coli - MIC*    AMPICILLIN 4 SENSITIVE Sensitive     CEFAZOLIN <=4 SENSITIVE Sensitive     CEFEPIME <=0.12 SENSITIVE Sensitive     CEFTRIAXONE <=0.25 SENSITIVE Sensitive     CIPROFLOXACIN <=0.25 SENSITIVE Sensitive     GENTAMICIN <=1 SENSITIVE Sensitive     IMIPENEM <=0.25 SENSITIVE Sensitive     NITROFURANTOIN <=16 SENSITIVE Sensitive      TRIMETH/SULFA <=20 SENSITIVE Sensitive     AMPICILLIN/SULBACTAM <=2 SENSITIVE Sensitive     PIP/TAZO <=4 SENSITIVE Sensitive     * >=100,000 COLONIES/mL ESCHERICHIA COLI     Radiology Studies: No results found.  Scheduled Meds:  atorvastatin  80 mg Per Tube QPC supper   cefadroxil  500 mg Per Tube BID   clopidogrel  75 mg Per Tube Daily   diclofenac Sodium  4 g Topical QID   ezetimibe  10 mg Per Tube Daily   feeding supplement  237 mL Oral BID BM   free water  100 mL Per Tube Q4H   magnesium oxide  800 mg Per Tube BID   melatonin  3 mg Per Tube QHS   multivitamin  15 mL Per Tube Daily   pantoprazole (PROTONIX) IV  40 mg Intravenous Q12H   polyethylene glycol  17 g Oral Daily   senna-docusate  1 tablet Oral BID   thiamine  100 mg Per Tube Daily   tiZANidine  2 mg Per Tube BID   Continuous Infusions:  feeding supplement (OSMOLITE 1.5 CAL) 1,000 mL (12/07/22 1245)     LOS: 7 days   Shelly Coss, MD [facility] P1/27/2024, 11:06 AM

## 2022-12-09 DIAGNOSIS — I69391 Dysphagia following cerebral infarction: Secondary | ICD-10-CM | POA: Diagnosis not present

## 2022-12-09 LAB — BASIC METABOLIC PANEL
Anion gap: 14 (ref 5–15)
BUN: 8 mg/dL (ref 8–23)
CO2: 26 mmol/L (ref 22–32)
Calcium: 8.1 mg/dL — ABNORMAL LOW (ref 8.9–10.3)
Chloride: 96 mmol/L — ABNORMAL LOW (ref 98–111)
Creatinine, Ser: 0.86 mg/dL (ref 0.44–1.00)
GFR, Estimated: >60 mL/min (ref >60)
Glucose, Bld: 110 mg/dL — ABNORMAL HIGH (ref 70–99)
Potassium: 3.7 mmol/L (ref 3.5–5.1)
Sodium: 136 mmol/L (ref 135–145)

## 2022-12-09 LAB — CBC
HCT: 27.7 % — ABNORMAL LOW (ref 36.0–46.0)
Hemoglobin: 9 g/dL — ABNORMAL LOW (ref 12.0–15.0)
MCH: 28.3 pg (ref 26.0–34.0)
MCHC: 32.5 g/dL (ref 30.0–36.0)
MCV: 87.1 fL (ref 80.0–100.0)
Platelets: 319 10*3/uL (ref 150–400)
RBC: 3.18 MIL/uL — ABNORMAL LOW (ref 3.87–5.11)
RDW: 17.2 % — ABNORMAL HIGH (ref 11.5–15.5)
WBC: 7.9 10*3/uL (ref 4.0–10.5)
nRBC: 0 % (ref 0.0–0.2)

## 2022-12-09 LAB — GLUCOSE, CAPILLARY
Glucose-Capillary: 86 mg/dL (ref 70–99)
Glucose-Capillary: 106 mg/dL — ABNORMAL HIGH (ref 70–99)

## 2022-12-09 LAB — HEPATIC FUNCTION PANEL
ALT: 175 U/L — ABNORMAL HIGH (ref 0–44)
AST: 256 U/L — ABNORMAL HIGH (ref 15–41)
Albumin: 2.5 g/dL — ABNORMAL LOW (ref 3.5–5.0)
Alkaline Phosphatase: 235 U/L — ABNORMAL HIGH (ref 38–126)
Bilirubin, Direct: 0.2 mg/dL (ref 0.0–0.2)
Indirect Bilirubin: 0.4 mg/dL (ref 0.3–0.9)
Total Bilirubin: 0.6 mg/dL (ref 0.3–1.2)
Total Protein: 5.4 g/dL — ABNORMAL LOW (ref 6.5–8.1)

## 2022-12-09 MED ORDER — DEXTROSE-NACL 5-0.9 % IV SOLN: INTRAVENOUS | Status: DC

## 2022-12-09 NOTE — TOC Progression Note (Signed)
Transition of Care [facility]) - Progression Note    Patient Details  Name: Kara Bailey MRN: [id number] Date of Birth: 10-27-1956  [facility] of [facility]) CM/SW [place], RN Phone Number: 12/09/2022, 1:36 PM  Clinical Narrative:   This RNCM notified [facility], per [place] has not been received.  This RNCM spoke with patient's son [name] to advise that insurance authorization for short term  SNF placement with [facility] is still pending.    TOC will continue to follow         Expected Discharge Plan and Services         Expected Discharge Date: 12/07/22                                     Social Determinants of Health (SDOH) Interventions SDOH Screenings   Food Insecurity: Unknown (12/04/2022)  Housing: Low Risk  (12/04/2022)  Transportation Needs: Unknown (12/04/2022)  Utilities: Unknown (12/04/2022)  Depression (PHQ2-9): High Risk (09/19/2022)  Tobacco Use: High Risk (12/03/2022)    Readmission Risk Interventions     No data to display

## 2022-12-09 NOTE — Progress Notes (Signed)
Dysphagia as late effect of cerebrovascular accident (CVA)  Subjective: Pain improved this am  Objective: Vital signs in last 24 hours: Temp:  [98.2 F (36.8 C)-98.6 F (37 C)] 98.2 F (36.8 C) (01/28 0546) Pulse Rate:  [98-110] 98 (01/28 0546) Resp:  [16-20] 16 (01/28 0546) BP: (113-152)/(64-95) 116/64 (01/28 0546) SpO2:  [98 %-99 %] 99 % (01/28 0546) Weight:  [50.3 kg] 50.3 kg (01/28 0546) Last BM Date : 12/08/22  Intake/Output from previous day: 01/27 0701 - 01/28 0700 In: 36 [IV Piggyback:59] Out: 725 [Urine:725] Intake/Output this shift: No intake/output data recorded.  General appearance: alert and cooperative GI: mild RUQ TTP  Lab Results:  Results for orders placed or performed during the hospital encounter of 11/29/22 (from the past 24 hour(s))  Comprehensive metabolic panel     Status: Abnormal   Collection Time: 12/08/22  2:49 PM  Result Value Ref Range   Sodium 136 135 - 145 mmol/L   Potassium 4.1 3.5 - 5.1 mmol/L   Chloride 99 98 - 111 mmol/L   CO2 27 22 - 32 mmol/L   Glucose, Bld 109 (H) 70 - 99 mg/dL   BUN 9 8 - 23 mg/dL   Creatinine, Ser 0.74 0.44 - 1.00 mg/dL   Calcium 8.8 (L) 8.9 - 10.3 mg/dL   Total Protein 6.2 (L) 6.5 - 8.1 g/dL   Albumin 3.0 (L) 3.5 - 5.0 g/dL   AST 359 (H) 15 - 41 U/L   ALT 212 (H) 0 - 44 U/L   Alkaline Phosphatase 258 (H) 38 - 126 U/L   Total Bilirubin 1.7 (H) 0.3 - 1.2 mg/dL   GFR, Estimated >60 >60 mL/min   Anion gap 10 5 - 15  Glucose, capillary     Status: Abnormal   Collection Time: 12/08/22  5:01 PM  Result Value Ref Range   Glucose-Capillary 102 (H) 70 - 99 mg/dL  Glucose, capillary     Status: None   Collection Time: 12/08/22 11:52 PM  Result Value Ref Range   Glucose-Capillary 84 70 - 99 mg/dL  Glucose, capillary     Status: None   Collection Time: 12/09/22  5:47 AM  Result Value Ref Range   Glucose-Capillary 86 70 - 99 mg/dL  CBC     Status: Abnormal   Collection Time: 12/09/22  6:35 AM  Result Value Ref  Range   WBC 7.9 4.0 - 10.5 K/uL   RBC 3.18 (L) 3.87 - 5.11 MIL/uL   Hemoglobin 9.0 (L) 12.0 - 15.0 g/dL   HCT 27.7 (L) 36.0 - 46.0 %   MCV 87.1 80.0 - 100.0 fL   MCH 28.3 26.0 - 34.0 pg   MCHC 32.5 30.0 - 36.0 g/dL   RDW 17.2 (H) 11.5 - 15.5 %   Platelets 319 150 - 400 K/uL   nRBC 0.0 0.0 - 0.2 %  Basic metabolic panel     Status: Abnormal   Collection Time: 12/09/22  6:35 AM  Result Value Ref Range   Sodium 136 135 - 145 mmol/L   Potassium 3.7 3.5 - 5.1 mmol/L   Chloride 96 (L) 98 - 111 mmol/L   CO2 26 22 - 32 mmol/L   Glucose, Bld 110 (H) 70 - 99 mg/dL   BUN 8 8 - 23 mg/dL   Creatinine, Ser 0.86 0.44 - 1.00 mg/dL   Calcium 8.1 (L) 8.9 - 10.3 mg/dL   GFR, Estimated >60 >60 mL/min   Anion gap 14 5 - 15  Studies/Results Radiology     MEDS, Scheduled  diclofenac Sodium  4 g Topical QID   ezetimibe  10 mg Per Tube Daily   feeding supplement  237 mL Oral BID BM   free water  100 mL Per Tube Q4H   magnesium oxide  800 mg Per Tube BID   melatonin  3 mg Per Tube QHS   multivitamin  15 mL Per Tube Daily   pantoprazole (PROTONIX) IV  40 mg Intravenous Q12H   polyethylene glycol  17 g Oral Daily   senna-docusate  1 tablet Oral BID   thiamine  100 mg Per Tube Daily   tiZANidine  2 mg Per Tube BID     Assessment: 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

## 2022-12-09 NOTE — Progress Notes (Signed)
PROGRESS NOTE  Kara Bailey  FYB:[id number] DOB: Jul 10, 1956 DOA: 11/29/2022 PCP: Andree Moro, DO   Brief Narrative: Patient is a 67 year old female with history of CVA, hypertension presented the emergency department at the request of her PCP for the evaluation of persistent dysphagia.  Found to have multifactorial dysphagia likely from stroke.  Status post G-tube placement by IR.  PT/OT recommending SNF on discharge.  Developed nausea, vomiting, abdominal pain on 1/26,  CT abdomen/pelvis showed possible cholecystitis, MRCP confirmed acute cholecystitis.  General surgery following.  Currently on antibiotics, n.p.o. status.  Abdominal pain has improved today.  Assessment & Plan:  Principal Problem:   Dysphagia as late effect of cerebrovascular accident (CVA) Active Problems:   Essential hypertension   Left hemiplegia (HCC)   History of stroke   Dysphagia   Protein-calorie malnutrition, severe  Dysphagia: Likely secondary to residual effect of the ischemic stroke.  Has history of esophageal stricture last dilated on 2021.  GI consulted and was following.  Barium swallow done on 11/30/2022 was nondiagnostic.  Underwent G-tube placement by IR on 1/22.  Underwent MBS again  speech therapy to check if she can swallow orally.Started on full liquid diet. Tube feeding diet, oral diet currently on hold due to finding of cholecystitis  Abdomen pain, nausea, vomiting/acute cholecystitis: Unclear etiology, may be a calculus cholecystitis.  CT abdomen/pelvis, MRCP confirmed acute cholecystitis without stones.  Patient was complaining of   periumbilical abdominal pain but now it has resolved.  Abdomen is benign on examination: Soft, nondistended, nontender with good bowel sounds.  No nausea or vomiting today.  Liver enzymes were elevated, now trending down.  Continue gentle IV fluids.  Continue n.p.o. status.  General surgery following,on zosyn  Severe protein calorie malnutrition: Nutrition was  following.  BMI of 18.  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  On Plavix, aspirin, Lipitor, Zetia.  Also on baclofen and Zanaflex for left-sided hemiplegia and spasticity  Hypertension: Currently blood pressure stable.  Hypomagnesemia: Continue magnesium supplementation.  Depression: On celexa  Debility/deconditioning: PT/OT recommending SNF on discharge.TOC following      Nutrition Problem: Severe Malnutrition Etiology: chronic illness, dysphagia (h/o CVA)    DVT prophylaxis:SCDs Start: 11/30/22 0151     Code Status: Full Code  Family Communication: Called daughter [name] on phone on 1/28 ,call not received  Patient status:Inpatient  Patient is from :Home  Anticipated discharge to:SnF  Estimated DC date: After  general surgery clearance   Consultants: GI,IR  Procedures:G tube placement   Antimicrobials:  Anti-infectives (From admission, onward)    Start     Dose/Rate Route Frequency Ordered Stop   12/08/22 2000  piperacillin-tazobactam (ZOSYN) IVPB 3.375 g        3.375 g 12.5 mL/hr over 240 Minutes Intravenous Every 8 hours 12/08/22 1900     12/07/22 1130  cefadroxil (DURICEF) capsule 500 mg  Status:  Discontinued        500 mg Oral 2 times daily 12/07/22 1033 12/07/22 1033   12/07/22 1130  cefadroxil (DURICEF) capsule 500 mg  Status:  Discontinued        500 mg Oral 2 times daily 12/07/22 1033 12/07/22 1034   12/07/22 1130  cefadroxil (DURICEF) capsule 500 mg  Status:  Discontinued        500 mg Per Tube 2 times daily 12/07/22 1034 12/08/22 1903   12/03/22 0700  ceFAZolin (ANCEF) IVPB 2g/100 mL premix  2 g 200 mL/hr over 30 Minutes Intravenous To Radiology 11/30/22 1602 12/04/22 0700       Subjective: Patient seen and examined at bedside today.  Hemodynamically stable.  She looks comfortable today.  Any abdomen pain, nausea or vomiting.  Objective: Vitals:   12/08/22 0656 12/08/22  1352 12/08/22 2022 12/09/22 0546  BP: (!) 143/100 (!) 152/95 113/67 116/64  Pulse: (!) 109 (!) 110 (!) 107 98  Resp: '17 20 16 16  '$ Temp: 98 F (36.7 C) 98.6 F (37 C) 98.3 F (36.8 C) 98.2 F (36.8 C)  TempSrc: Oral Oral Oral Oral  SpO2: 100% 98% 99% 99%  Weight: 53.7 kg   50.3 kg  Height:        Intake/Output Summary (Last 24 hours) at 12/09/2022 1104 Last data filed at 12/09/2022 0555 Gross per 24 hour  Intake 58.98 ml  Output 725 ml  Net -666.02 ml   Filed Weights   12/06/22 0418 12/08/22 0656 12/09/22 0546  Weight: 53 kg 53.7 kg 50.3 kg    Examination:  General exam: Overall comfortable, not in distress,deconditioned HEENT: PERRL Respiratory system:  no wheezes or crackles  Cardiovascular system: S1 & S2 heard, RRR.  Gastrointestinal system: Abdomen is nondistended, soft and nontender.G tube Central nervous system: Alert and oriented.left hemiplegia Extremities: No edema, no clubbing ,no cyanosis Skin: No rashes, no ulcers,no icterus     Data Reviewed: I have personally reviewed following labs and imaging studies  CBC: Recent Labs  Lab 12/09/22 0635  WBC 7.9  HGB 9.0*  HCT 27.7*  MCV 87.1  PLT 163    Basic Metabolic Panel: Recent Labs  Lab 12/02/22 1619 12/03/22 1122 12/03/22 1122 12/04/22 0651 12/05/22 0629 12/06/22 0636 12/08/22 1449 12/09/22 0635  NA  --  141   < > 140 137 139 136 136  K  --  4.0   < > 3.8 3.6 4.0 4.1 3.7  CL  --  108   < > 107 105 106 99 96*  CO2  --  27   < > '26 25 25 27 26  '$ GLUCOSE  --  102*   < > 92 97 93 109* 110*  BUN  --  <5*   < > <5* <5* 7* 9 8  CREATININE  --  0.73   < > 0.76 0.72 0.61 0.74 0.86  CALCIUM  --  8.5*   < > 8.3* 8.1* 8.4* 8.8* 8.1*  MG 1.8 1.6*  --  1.5* 1.6* 1.8  --   --   PHOS 3.5 3.7  --  3.8 2.9 3.2  --   --    < > = values in this interval not displayed.     Recent Results (from the past 240 hour(s))  Urine Culture     Status: Abnormal   Collection Time: 11/30/22 10:02 PM   Specimen:  Urine, Clean Catch  Result Value Ref Range Status   Specimen Description   Final    URINE, CLEAN CATCH Performed at [facility], [place] [address] [place], [place] [address]    Special Requests   Final    NONE Performed at [facility], [place] [address], [place], [place] [address]    Culture >=100,000 COLONIES/mL ESCHERICHIA COLI (A)  Final   Report Status 12/04/2022 FINAL  Final   Organism ID, Bacteria ESCHERICHIA COLI (A)  Final      Susceptibility   Escherichia coli - MIC*    AMPICILLIN 4  SENSITIVE Sensitive     CEFAZOLIN <=4 SENSITIVE Sensitive     CEFEPIME <=0.12 SENSITIVE Sensitive     CEFTRIAXONE <=0.25 SENSITIVE Sensitive     CIPROFLOXACIN <=0.25 SENSITIVE Sensitive     GENTAMICIN <=1 SENSITIVE Sensitive     IMIPENEM <=0.25 SENSITIVE Sensitive     NITROFURANTOIN <=16 SENSITIVE Sensitive     TRIMETH/SULFA <=20 SENSITIVE Sensitive     AMPICILLIN/SULBACTAM <=2 SENSITIVE Sensitive     PIP/TAZO <=4 SENSITIVE Sensitive     * >=100,000 COLONIES/mL ESCHERICHIA COLI     Radiology Studies: MR ABDOMEN MRCP WO CONTRAST  Result Date: 12/08/2022 CLINICAL DATA:  Concern for cholecystitis with abnormal biliary ductal dilation EXAM: MRI ABDOMEN WITHOUT CONTRAST  (INCLUDING MRCP) TECHNIQUE: Multiplanar multisequence MR imaging of the abdomen was performed. Heavily T2-weighted images of the biliary and pancreatic ducts were obtained, and three-dimensional MRCP images were rendered by post processing. COMPARISON:  CT December 08, 2022 FINDINGS: Despite efforts by the technologist and patient, motion artifact is present on today's exam and could not be eliminated. This reduces exam sensitivity and specificity. Lower chest: Limited evaluation of the lungs reveals no acute abnormality. Hepatobiliary: No significant hepatic steatosis. T2 hyperintense 15 x 12 mm lesion in the lateral left lobe of the liver on image 10/5 demonstrates a few thin internal septations,  incompletely evaluated without intravenous contrast material but demonstrated marked enhancement on contrast enhanced CT from same day. Gallbladder is distended with pericholecystic fluid. Intra and extrahepatic biliary ductal dilation to the level of the ampulla with the common duct measuring 13 mm. No choledocholithiasis or discrete obstructing mass identified. Pancreas: No pancreatic ductal dilation or evidence of acute inflammation. Spleen:  No splenomegaly. Adrenals/Urinary Tract: Bilateral adrenal glands appear normal. No hydronephrosis. Stomach/Bowel: Percutaneous gastrostomy tube with retention balloon in the stomach. Vascular/Lymphatic: Normal caliber abdominal aorta. No pathologically enlarged abdominal lymph nodes. Other:  None. Musculoskeletal: No suspicious bone lesions identified. IMPRESSION: 1. Gallbladder is distended with wall thickening and mild pericholecystic fluid, concerning for acute cholecystitis. 2. Intra and extrahepatic biliary ductal dilation to the level of the ampulla with the common duct measuring 13 mm without discrete obstructive etiology identified, suggest correlation with laboratory values (T bili) to assess for biliary obstruction and consider further evaluation with EUS to evaluate for biliary stricture versus MRI occult lesion. 3. T2 hyperintense 15 x 12 mm lesion in the lateral left lobe of the liver on image 10/5 demonstrates a few thin internal septations, incompletely evaluated without intravenous contrast material on this examination but on same day contrast enhanced CT demonstrated marked enhancement. Imaging characteristics between the 2 examinations are most compatible with a benign hepatic hemangioma. Attention on follow-up imaging suggested. Electronically Signed   By: Dahlia Bailiff M.D.   On: 12/08/2022 19:06   MR 3D Recon At Scanner  Result Date: 12/08/2022 CLINICAL DATA:  Concern for cholecystitis with abnormal biliary ductal dilation EXAM: MRI ABDOMEN WITHOUT  CONTRAST  (INCLUDING MRCP) TECHNIQUE: Multiplanar multisequence MR imaging of the abdomen was performed. Heavily T2-weighted images of the biliary and pancreatic ducts were obtained, and three-dimensional MRCP images were rendered by post processing. COMPARISON:  CT December 08, 2022 FINDINGS: Despite efforts by the technologist and patient, motion artifact is present on today's exam and could not be eliminated. This reduces exam sensitivity and specificity. Lower chest: Limited evaluation of the lungs reveals no acute abnormality. Hepatobiliary: No significant hepatic steatosis. T2 hyperintense 15 x 12 mm lesion in the lateral left lobe of the liver on  image 10/5 demonstrates a few thin internal septations, incompletely evaluated without intravenous contrast material but demonstrated marked enhancement on contrast enhanced CT from same day. Gallbladder is distended with pericholecystic fluid. Intra and extrahepatic biliary ductal dilation to the level of the ampulla with the common duct measuring 13 mm. No choledocholithiasis or discrete obstructing mass identified. Pancreas: No pancreatic ductal dilation or evidence of acute inflammation. Spleen:  No splenomegaly. Adrenals/Urinary Tract: Bilateral adrenal glands appear normal. No hydronephrosis. Stomach/Bowel: Percutaneous gastrostomy tube with retention balloon in the stomach. Vascular/Lymphatic: Normal caliber abdominal aorta. No pathologically enlarged abdominal lymph nodes. Other:  None. Musculoskeletal: No suspicious bone lesions identified. IMPRESSION: 1. Gallbladder is distended with wall thickening and mild pericholecystic fluid, concerning for acute cholecystitis. 2. Intra and extrahepatic biliary ductal dilation to the level of the ampulla with the common duct measuring 13 mm without discrete obstructive etiology identified, suggest correlation with laboratory values (T bili) to assess for biliary obstruction and consider further evaluation with EUS to  evaluate for biliary stricture versus MRI occult lesion. 3. T2 hyperintense 15 x 12 mm lesion in the lateral left lobe of the liver on image 10/5 demonstrates a few thin internal septations, incompletely evaluated without intravenous contrast material on this examination but on same day contrast enhanced CT demonstrated marked enhancement. Imaging characteristics between the 2 examinations are most compatible with a benign hepatic hemangioma. Attention on follow-up imaging suggested. Electronically Signed   By: Dahlia Bailiff M.D.   On: 12/08/2022 19:06   CT ABDOMEN PELVIS W CONTRAST  Result Date: 12/08/2022 CLINICAL DATA:  Bowel obstruction suspected.  Dysphagia. EXAM: CT ABDOMEN AND PELVIS WITH CONTRAST TECHNIQUE: Multidetector CT imaging of the abdomen and pelvis was performed using the standard protocol following bolus administration of intravenous contrast. RADIATION DOSE REDUCTION: This exam was performed according to the departmental dose-optimization program which includes automated exposure control, adjustment of the mA and/or kV according to patient size and/or use of iterative reconstruction technique. CONTRAST:  156m OMNIPAQUE IOHEXOL 300 MG/ML  SOLN COMPARISON:  CT scan of the abdomen and pelvis May 06, 2013. CT scan of the abdomen and pelvis November 30, 2022. FINDINGS: Lower chest: No acute abnormality. Hepatobiliary: Intra and extrahepatic biliary duct dilatation is identified, new compared to May 06, 2013 and not well evaluated on the November 30, 2022 CT scan without contrast. No discrete filling defects are identified in the common bile duct. The gallbladder is markedly distended with no adjacent stranding or obvious stones. Pericholecystic fluid is noted however. Focal fatty deposition adjacent to the falciform ligament. No liver mass identified. Pancreas: Unremarkable. No pancreatic ductal dilatation or surrounding inflammatory changes. Spleen: Normal in size without focal abnormality.  Adrenals/Urinary Tract: Adrenal glands are normal. A 3 mm stone is identified in the mid right kidney without hydronephrosis. The kidneys are otherwise normal. The ureters and bladder are unremarkable. Stomach/Bowel: A PEG tube is appropriately located with the balloon and distal tip in the stomach. There is soft tissue thickening with central fat anterior and inferior to the gastric antrum as seen on series 2, images 30 through 35. The finding is similar in appearance to the May 06, 2013 study. The small bowel is decompressed without obvious abnormality. There is contrast throughout the length of the colon with no colonic abnormality identified. Appendix is not visualized consistent with history of appendectomy. Vascular/Lymphatic: Calcified atherosclerotic changes are identified in the nonaneurysmal aorta, extending into the iliac vessels. No adenopathy. Reproductive: Uterus and bilateral adnexa are unremarkable. Other: No abdominal  wall hernia or abnormality. No abdominopelvic ascites. Musculoskeletal: No acute or significant osseous findings. IMPRESSION: 1. Intra and extrahepatic biliary duct dilatation is identified, new compared to May 06, 2013 and not well evaluated on the November 30, 2022 CT scan without contrast. The gallbladder is markedly distended with pericholecystic fluid. There is no adjacent stranding or obvious stones. Recommend an MRCP or ERCP for better evaluation. 2. There is soft tissue thickening with central fat anterior and inferior to the gastric antrum as seen on series 2, images 30 through 35. The finding is similar in appearance to the May 06, 2013 study. The findings could represent sequela of a previous Phillip Heal patch or previous ulcerative disease. 3. 3 mm nonobstructive stone in the right kidney. 4. Calcified atherosclerotic changes in the nonaneurysmal aorta extending into the iliac vessels. 5. Aortic atherosclerosis. Aortic Atherosclerosis (ICD10-I70.0). Electronically Signed   By:  Dorise Bullion III M.D.   On: 12/08/2022 12:08    Scheduled Meds:  diclofenac Sodium  4 g Topical QID   ezetimibe  10 mg Per Tube Daily   feeding supplement  237 mL Oral BID BM   free water  100 mL Per Tube Q4H   magnesium oxide  800 mg Per Tube BID   melatonin  3 mg Per Tube QHS   multivitamin  15 mL Per Tube Daily   pantoprazole (PROTONIX) IV  40 mg Intravenous Q12H   polyethylene glycol  17 g Oral Daily   senna-docusate  1 tablet Oral BID   thiamine  100 mg Per Tube Daily   tiZANidine  2 mg Per Tube BID   Continuous Infusions:  dextrose 5 % and 0.9% NaCl 75 mL/hr at 12/09/22 1023   feeding supplement (OSMOLITE 1.5 CAL) 1,000 mL (12/07/22 1245)   piperacillin-tazobactam (ZOSYN)  IV 3.375 g (12/09/22 0316)     LOS: 8 days   Shelly Coss, MD [facility] P1/28/2024, 11:04 AM

## 2022-12-10 DIAGNOSIS — I69391 Dysphagia following cerebral infarction: Secondary | ICD-10-CM | POA: Diagnosis not present

## 2022-12-10 LAB — COMPREHENSIVE METABOLIC PANEL
ALT: 103 U/L — ABNORMAL HIGH (ref 0–44)
AST: 90 U/L — ABNORMAL HIGH (ref 15–41)
Albumin: 2.3 g/dL — ABNORMAL LOW (ref 3.5–5.0)
Alkaline Phosphatase: 169 U/L — ABNORMAL HIGH (ref 38–126)
Anion gap: 10 (ref 5–15)
BUN: 7 mg/dL — ABNORMAL LOW (ref 8–23)
CO2: 22 mmol/L (ref 22–32)
Calcium: 8.2 mg/dL — ABNORMAL LOW (ref 8.9–10.3)
Chloride: 100 mmol/L (ref 98–111)
Creatinine, Ser: 0.83 mg/dL (ref 0.44–1.00)
GFR, Estimated: >60 mL/min (ref >60)
Glucose, Bld: 87 mg/dL (ref 70–99)
Potassium: 3.9 mmol/L (ref 3.5–5.1)
Sodium: 132 mmol/L — ABNORMAL LOW (ref 135–145)
Total Bilirubin: 0.6 mg/dL (ref 0.3–1.2)
Total Protein: 4.6 g/dL — ABNORMAL LOW (ref 6.5–8.1)

## 2022-12-10 LAB — CBC
HCT: 25.8 % — ABNORMAL LOW (ref 36.0–46.0)
Hemoglobin: 8.5 g/dL — ABNORMAL LOW (ref 12.0–15.0)
MCH: 28.7 pg (ref 26.0–34.0)
MCHC: 32.9 g/dL (ref 30.0–36.0)
MCV: 87.2 fL (ref 80.0–100.0)
Platelets: 340 10*3/uL (ref 150–400)
RBC: 2.96 MIL/uL — ABNORMAL LOW (ref 3.87–5.11)
RDW: 17.4 % — ABNORMAL HIGH (ref 11.5–15.5)
WBC: 8.4 10*3/uL (ref 4.0–10.5)
nRBC: 0 % (ref 0.0–0.2)

## 2022-12-10 MED ORDER — ALPRAZOLAM 0.25 MG PO TABS
0.2500 mg | ORAL_TABLET | Freq: Three times a day (TID) | ORAL | Status: DC | PRN
  Administered 2022-12-10 – 2023-01-13 (×74): 0.25 mg
  Filled 2022-12-10 (×78): qty 1

## 2022-12-10 MED ORDER — POLYETHYLENE GLYCOL 3350 17 G PO PACK
17.0000 g | PACK | Freq: Every day | ORAL | Status: DC
  Filled 2022-12-10: qty 1

## 2022-12-10 MED ORDER — GUAIFENESIN-DM 100-10 MG/5ML PO SYRP: 5.0000 mL | ORAL_SOLUTION | ORAL | Status: DC | PRN

## 2022-12-10 MED ORDER — OSMOLITE 1.5 CAL PO LIQD
1000.0000 mL | ORAL | Status: DC
  Administered 2022-12-10 – 2022-12-17 (×4): 1000 mL
  Filled 2022-12-10 (×13): qty 1000

## 2022-12-10 MED ORDER — SENNOSIDES-DOCUSATE SODIUM 8.6-50 MG PO TABS
1.0000 | ORAL_TABLET | Freq: Two times a day (BID) | ORAL | Status: DC
  Administered 2022-12-10 – 2022-12-15 (×3): 1
  Filled 2022-12-10 (×7): qty 1

## 2022-12-10 MED ORDER — ENSURE ENLIVE PO LIQD
237.0000 mL | Freq: Two times a day (BID) | ORAL | Status: DC
  Administered 2022-12-10: 237 mL

## 2022-12-10 NOTE — Progress Notes (Signed)
Speech Language Pathology Treatment: Dysphagia;Cognitive-Linquistic  Patient Details Name: Kara Bailey MRN: [id number] DOB: June 01, 1956 Today's Date: 12/10/2022 Time: 7628-3151 SLP Time Calculation (min) (ACUTE ONLY): 25 min  Assessment / Plan / Recommendation Clinical Impression  Patient seen by SLP for skilled treatment focused on dysphagia and speech goals. Patient was sitting up in recliner with cup of water on table. Her RN entered room to discuss with SLP the new swallow evaluation order. Intention for this order was clarification that she could have thin liquids as a previous order or recommendation from SLP was for thin liquids and nectar thick liquids. SLP verified that patient was able to have thin liquids. She drank via straw sips with delayed oral phase but no overt s/s during pharyngeal phase. SLP also reviewed swallowing strategies which she was able to return demonstrate. She told SLP she did not want to do anything that wasn't recommended by primary SLP, "If she doesn't want me to do it, I won't do it." SLP also reviewed some voice and speech exercises and strategies and made handout for patient to use upon hospital discharge. Her overall speech intelligibility has improved but she continues with very low vocal intensity. SLP will continue to follow patient while admitted.    HPI HPI: Kara Bailey is a 67 y.o. female who presented to [facility] ED on 11/29/22 with worsening dysphagia c/b inability to swallow pills or solid foods. She was sent to ED by PCP for PEG placement (PEG 12/03/22). PMHx includes CVAs, dysarthria, HTN, prior epigastric hernia repair, iliac stent. Pt has a long hx of dysphagia and has worked with SLP in acute, inpatient, and OP settings over two years. She has had multiple MBS studies. 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  Pt's dysphagia has been primarily oral (difficulty with onset of oral phase and oral control) but pharyngeal function has been largely intact with good bolus clearance and adequate airway protection. Review of 11/30/22 esophagram reveals similar mechanics of swallow. 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      SLP Plan  Continue with current plan of care      Recommendations for follow up therapy are one component of a multi-disciplinary discharge planning process, led by the attending physician.  Recommendations may be updated based on patient status, additional functional criteria and insurance authorization.    Recommendations  Diet recommendations: Thin liquid Liquids provided via: Cup;Straw Medication Administration: Other (Comment) (liquid form if possible, crushed/disolved in liquids) Supervision: Patient able to self feed Compensations: Slow rate;Small sips/bites;Other (Comment) Postural Changes and/or Swallow Maneuvers: Seated upright 90 degrees;Upright 30-60 min after meal                Oral Care Recommendations: Oral care BID Follow Up Recommendations: Skilled nursing-short term rehab (<3 hours/day) SLP Visit Diagnosis: Dysarthria and anarthria (R47.1) Plan: Continue with current plan of care           Sonia Baller, MA, CCC-SLP Speech Therapy

## 2022-12-10 NOTE — Progress Notes (Signed)
Physical Therapy Treatment Patient Details Name: Kara Bailey MRN: [id number] DOB: [birth year] Today's Date: 12/10/2022   History of Present Illness Patient is 67 year old female admitted 11/29/22 with multifactorial dysphagia with plan for PEG tube.  Pt with hx of CVA with L hemiparesis, HTN, iliac stent, hep C, GERD    PT Comments    Pt is making steady progress with mobility. Pt is motivated and participates with therapy. Pt completed LE there ex and focused on standing balance and weight shifting over BOS. Pt is currently max assist to stand and min assist for standing balance with R UE supported on a chair. Pt will continue to benefit from acute skilled PT to maximize mobility and independence until d/c to the next venue of care.    Recommendations for follow up therapy are one component of a multi-disciplinary discharge planning process, led by the attending physician.  Recommendations may be updated based on patient status, additional functional criteria and insurance authorization.  Follow Up Recommendations  Skilled nursing-short term rehab (<3 hours/day) Can patient physically be transported by private vehicle: No   Assistance Recommended at Discharge Frequent or constant Supervision/Assistance  Patient can return home with the following A little help with walking and/or transfers;A little help with bathing/dressing/bathroom;Assistance with cooking/housework;Help with stairs or ramp for entrance   Equipment Recommendations  None recommended by PT    Recommendations for Other Services       Precautions / Restrictions Precautions Precautions: Fall Precaution Comments: L hemi Restrictions Weight Bearing Restrictions: No     Mobility  Bed Mobility               General bed mobility comments: sitting in recliner    Transfers Overall transfer level: Needs assistance Equipment used: 1 person hand held assist Transfers: Sit to/from Stand Sit to Stand: Max assist            General transfer comment: sit-stand- stood 5 mins focusing on WB on L LE and balance, L knee supported by therapist    Ambulation/Gait             Pre-gait activities: stood x2 min assist for static standing balance with chair in front of patient for support, tactile cues for upright posture, lateral weight shifts over BOS     Stairs             Wheelchair Mobility    Modified Rankin (Stroke Patients Only)       Balance Overall balance assessment: Needs assistance Sitting-balance support: No upper extremity supported Sitting balance-Leahy Scale: Fair     Standing balance support: Single extremity supported Standing balance-Leahy Scale: Poor Standing balance comment: R UE support on therajpist and min A for static stance                            Cognition Arousal/Alertness: Awake/alert Behavior During Therapy: Flat affect Overall Cognitive Status: Within Functional Limits for tasks assessed                                          Exercises General Exercises - Lower Extremity Ankle Circles/Pumps: AAROM, Strengthening, Left, 10 reps, Seated Long Arc Quad: AAROM, Strengthening, Left, 10 reps, Seated, AROM, Right Heel Slides: AAROM, Strengthening, Left, 10 reps, Seated, Right, AROM Hip ABduction/ADduction: AAROM, Strengthening, Left, 10 reps, Seated Straight Leg Raises:  AAROM, Strengthening, Right, Left, 10 reps, Seated, AROM    General Comments        Pertinent Vitals/Pain Pain Assessment Pain Assessment: No/denies pain Faces Pain Scale: Hurts little more Pain Location: L LE with ROM, L UE with ROM Pain Descriptors / Indicators: Discomfort, Guarding, Grimacing Pain Intervention(s): Limited activity within patient's tolerance    Home Living                          Prior Function            PT Goals (current goals can now be found in the care plan section) Progress towards PT goals:  Progressing toward goals    Frequency    Min 2X/week      PT Plan Current plan remains appropriate    Co-evaluation              AM-PAC PT "6 Clicks" Mobility   Outcome Measure  Help needed turning from your back to your side while in a flat bed without using bedrails?: A Little Help needed moving from lying on your back to sitting on the side of a flat bed without using bedrails?: A Lot Help needed moving to and from a bed to a chair (including a wheelchair)?: A Lot Help needed standing up from a chair using your arms (e.g., wheelchair or bedside chair)?: A Lot Help needed to walk in hospital room?: Total Help needed climbing 3-5 steps with a railing? : Total 6 Click Score: 11    End of Session Equipment Utilized During Treatment: Gait belt Activity Tolerance: Patient tolerated treatment well Patient left: in chair;with call bell/phone within reach;with chair alarm set Nurse Communication: Mobility status PT Visit Diagnosis: Other abnormalities of gait and mobility (R26.89);Hemiplegia and hemiparesis;Difficulty in walking, not elsewhere classified (R26.2) Hemiplegia - Right/Left: Left Hemiplegia - caused by: Cerebral infarction     Time: 1030-1058 PT Time Calculation (min) (ACUTE ONLY): 28 min  Charges:  $Therapeutic Activity: 8-22 mins $Neuromuscular Re-education: 8-22 mins                       Lelon Mast 12/10/2022, 11:05 AM

## 2022-12-10 NOTE — Care Management Important Message (Signed)
Important Message  Patient Details IM Letter given. Name: NAYDENE KAMROWSKI MRN: [id number] Date of Birth: 10-20-1956   Medicare Important Message Given:  Yes     Kerin Salen 12/10/2022, 10:28 AM

## 2022-12-10 NOTE — Progress Notes (Signed)
PROGRESS NOTE  Kara Bailey  ZOX:[id number] DOB: May 08, 1956 DOA: 11/29/2022 PCP: Andree Moro, DO   Brief Narrative: Patient is a 67 year old female with history of CVA, hypertension presented the emergency department at the request of her PCP for the evaluation of persistent dysphagia.  Found to have multifactorial dysphagia likely from stroke.  Status post G-tube placement by IR.  PT/OT recommending SNF on discharge.  Developed nausea, vomiting, abdominal pain on 1/26,  CT abdomen/pelvis showed possible cholecystitis, MRCP confirmed acute cholecystitis.  General surgery was  following.  Significant improvement abdominal pain, nausea, vomiting, liver enzymes.  Plan is to continue antibiotics without any operative intervention.  Diet started today.  Medically stable for discharge to SNF tomorrow  Assessment & Plan:  Principal Problem:   Dysphagia as late effect of cerebrovascular accident (CVA) Active Problems:   Essential hypertension   Left hemiplegia (HCC)   History of stroke   Dysphagia   Protein-calorie malnutrition, severe  Dysphagia: Likely secondary to residual effect of the ischemic stroke.  Has history of esophageal stricture last dilated on 2021.  GI consulted and was following.  Barium swallow done on 11/30/2022 was nondiagnostic.  Underwent G-tube placement by IR on 1/22.  Underwent MBS again  speech therapy to check if she can swallow orally.Started on full liquid diet. Continue tube feeding diet, full liquid diet. Speech therapy reconsulted due to patient's request  Abdomen pain, nausea, vomiting/acute cholecystitis: Unclear etiology, may be acalculus cholecystitis.  CT abdomen/pelvis, MRCP confirmed acute cholecystitis without stones.  Patient was complaining of   periumbilical abdominal pain but now it has resolved.  Abdomen is benign on examination: Soft, nondistended, nontender with good bowel sounds.  No nausea or vomiting today.  Liver enzymes were elevated, now  significantly trending down. .  General surgery following signed off.  Will continue Zosyn for today, change to Augmentin tomorrow.  Severe protein calorie malnutrition: Nutrition was following.  BMI of 18.  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  On Plavix, aspirin, Lipitor, Zetia.  Also on baclofen and Zanaflex for left-sided hemiplegia and spasticity  Hypertension: Currently blood pressure stable.  Hypomagnesemia: Continue magnesium supplementation.  Depression: On celexa  Debility/deconditioning: PT/OT recommending SNF on discharge.TOC following      Nutrition Problem: Severe Malnutrition Etiology: chronic illness, dysphagia (h/o CVA)    DVT prophylaxis:SCDs Start: 11/30/22 0151     Code Status: Full Code  Family Communication: Called daughter [name] on phone on 1/29  Patient status:Inpatient  Patient is from :Home  Anticipated discharge to:SnF  Estimated DC date: tomorrow if bed is available   Consultants: GI,IR  Procedures:G tube placement   Antimicrobials:  Anti-infectives (From admission, onward)    Start     Dose/Rate Route Frequency Ordered Stop   12/08/22 2000  piperacillin-tazobactam (ZOSYN) IVPB 3.375 g        3.375 g 12.5 mL/hr over 240 Minutes Intravenous Every 8 hours 12/08/22 1900     12/07/22 1130  cefadroxil (DURICEF) capsule 500 mg  Status:  Discontinued        500 mg Oral 2 times daily 12/07/22 1033 12/07/22 1033   12/07/22 1130  cefadroxil (DURICEF) capsule 500 mg  Status:  Discontinued        500 mg Oral 2 times daily 12/07/22 1033 12/07/22 1034   12/07/22 1130  cefadroxil (DURICEF) capsule 500 mg  Status:  Discontinued        500 mg Per Tube 2 times daily  12/07/22 1034 12/08/22 1903   12/03/22 0700  ceFAZolin (ANCEF) IVPB 2g/100 mL premix        2 g 200 mL/hr over 30 Minutes Intravenous To Radiology 11/30/22 1602 12/04/22 0700       Subjective: Patient seen and examined  at bedside today.  Comfortable.  No abdominal pain, nausea or vomiting.  Objective: Vitals:   12/09/22 0546 12/09/22 1412 12/09/22 2007 12/10/22 0641  BP: 116/64 106/75 133/73 114/62  Pulse: 98 86 91 84  Resp: '16 16 16 17  '$ Temp: 98.2 F (36.8 C) 98.6 F (37 C) 97.7 F (36.5 C) 98 F (36.7 C)  TempSrc: Oral Oral Oral Oral  SpO2: 99% 100% 100% 100%  Weight: 50.3 kg     Height:        Intake/Output Summary (Last 24 hours) at 12/10/2022 1119 Last data filed at 12/09/2022 1900 Gross per 24 hour  Intake 434.48 ml  Output 300 ml  Net 134.48 ml   Filed Weights   12/06/22 0418 12/08/22 0656 12/09/22 0546  Weight: 53 kg 53.7 kg 50.3 kg    Examination:  General exam: Overall comfortable, not in distress, deconditioned HEENT: PERRL Respiratory system:  no wheezes or crackles  Cardiovascular system: S1 & S2 heard, RRR.  Gastrointestinal system: Abdomen is nondistended, soft and nontender. Central nervous system: Alert and oriented, left hemiplegia Extremities: No edema, no clubbing ,no cyanosis Skin: No rashes, no ulcers,no icterus     Data Reviewed: I have personally reviewed following labs and imaging studies  CBC: Recent Labs  Lab 12/09/22 0635 12/10/22 0541  WBC 7.9 8.4  HGB 9.0* 8.5*  HCT 27.7* 25.8*  MCV 87.1 87.2  PLT 319 191    Basic Metabolic Panel: Recent Labs  Lab 12/03/22 1122 12/04/22 0651 12/05/22 0629 12/06/22 0636 12/08/22 1449 12/09/22 0635 12/10/22 0541  NA 141 140 137 139 136 136 132*  K 4.0 3.8 3.6 4.0 4.1 3.7 3.9  CL 108 107 105 106 99 96* 100  CO2 '27 26 25 25 27 26 22  '$ GLUCOSE 102* 92 97 93 109* 110* 87  BUN <5* <5* <5* 7* 9 8 7*  CREATININE 0.73 0.76 0.72 0.61 0.74 0.86 0.83  CALCIUM 8.5* 8.3* 8.1* 8.4* 8.8* 8.1* 8.2*  MG 1.6* 1.5* 1.6* 1.8  --   --   --   PHOS 3.7 3.8 2.9 3.2  --   --   --      Recent Results (from the past 240 hour(s))  Urine Culture     Status: Abnormal   Collection Time: 11/30/22 10:02 PM   Specimen:  Urine, Clean Catch  Result Value Ref Range Status   Specimen Description   Final    URINE, CLEAN CATCH Performed at [facility], [place] [address], [place], [place] [address]    Special Requests   Final    NONE Performed at [facility], [place] [address] [place], [place] [address]    Culture >=100,000 COLONIES/mL ESCHERICHIA COLI (A)  Final   Report Status 12/04/2022 FINAL  Final   Organism ID, Bacteria ESCHERICHIA COLI (A)  Final      Susceptibility   Escherichia coli - MIC*    AMPICILLIN 4 SENSITIVE Sensitive     CEFAZOLIN <=4 SENSITIVE Sensitive     CEFEPIME <=0.12 SENSITIVE Sensitive     CEFTRIAXONE <=0.25 SENSITIVE Sensitive     CIPROFLOXACIN <=0.25 SENSITIVE Sensitive     GENTAMICIN <=1 SENSITIVE Sensitive  IMIPENEM <=0.25 SENSITIVE Sensitive     NITROFURANTOIN <=16 SENSITIVE Sensitive     TRIMETH/SULFA <=20 SENSITIVE Sensitive     AMPICILLIN/SULBACTAM <=2 SENSITIVE Sensitive     PIP/TAZO <=4 SENSITIVE Sensitive     * >=100,000 COLONIES/mL ESCHERICHIA COLI     Radiology Studies: MR ABDOMEN MRCP WO CONTRAST  Result Date: 12/08/2022 CLINICAL DATA:  Concern for cholecystitis with abnormal biliary ductal dilation EXAM: MRI ABDOMEN WITHOUT CONTRAST  (INCLUDING MRCP) TECHNIQUE: Multiplanar multisequence MR imaging of the abdomen was performed. Heavily T2-weighted images of the biliary and pancreatic ducts were obtained, and three-dimensional MRCP images were rendered by post processing. COMPARISON:  CT December 08, 2022 FINDINGS: Despite efforts by the technologist and patient, motion artifact is present on today's exam and could not be eliminated. This reduces exam sensitivity and specificity. Lower chest: Limited evaluation of the lungs reveals no acute abnormality. Hepatobiliary: No significant hepatic steatosis. T2 hyperintense 15 x 12 mm lesion in the lateral left lobe of the liver on image 10/5 demonstrates a few thin internal septations,  incompletely evaluated without intravenous contrast material but demonstrated marked enhancement on contrast enhanced CT from same day. Gallbladder is distended with pericholecystic fluid. Intra and extrahepatic biliary ductal dilation to the level of the ampulla with the common duct measuring 13 mm. No choledocholithiasis or discrete obstructing mass identified. Pancreas: No pancreatic ductal dilation or evidence of acute inflammation. Spleen:  No splenomegaly. Adrenals/Urinary Tract: Bilateral adrenal glands appear normal. No hydronephrosis. Stomach/Bowel: Percutaneous gastrostomy tube with retention balloon in the stomach. Vascular/Lymphatic: Normal caliber abdominal aorta. No pathologically enlarged abdominal lymph nodes. Other:  None. Musculoskeletal: No suspicious bone lesions identified. IMPRESSION: 1. Gallbladder is distended with wall thickening and mild pericholecystic fluid, concerning for acute cholecystitis. 2. Intra and extrahepatic biliary ductal dilation to the level of the ampulla with the common duct measuring 13 mm without discrete obstructive etiology identified, suggest correlation with laboratory values (T bili) to assess for biliary obstruction and consider further evaluation with EUS to evaluate for biliary stricture versus MRI occult lesion. 3. T2 hyperintense 15 x 12 mm lesion in the lateral left lobe of the liver on image 10/5 demonstrates a few thin internal septations, incompletely evaluated without intravenous contrast material on this examination but on same day contrast enhanced CT demonstrated marked enhancement. Imaging characteristics between the 2 examinations are most compatible with a benign hepatic hemangioma. Attention on follow-up imaging suggested. Electronically Signed   By: Dahlia Bailiff M.D.   On: 12/08/2022 19:06   MR 3D Recon At Scanner  Result Date: 12/08/2022 CLINICAL DATA:  Concern for cholecystitis with abnormal biliary ductal dilation EXAM: MRI ABDOMEN WITHOUT  CONTRAST  (INCLUDING MRCP) TECHNIQUE: Multiplanar multisequence MR imaging of the abdomen was performed. Heavily T2-weighted images of the biliary and pancreatic ducts were obtained, and three-dimensional MRCP images were rendered by post processing. COMPARISON:  CT December 08, 2022 FINDINGS: Despite efforts by the technologist and patient, motion artifact is present on today's exam and could not be eliminated. This reduces exam sensitivity and specificity. Lower chest: Limited evaluation of the lungs reveals no acute abnormality. Hepatobiliary: No significant hepatic steatosis. T2 hyperintense 15 x 12 mm lesion in the lateral left lobe of the liver on image 10/5 demonstrates a few thin internal septations, incompletely evaluated without intravenous contrast material but demonstrated marked enhancement on contrast enhanced CT from same day. Gallbladder is distended with pericholecystic fluid. Intra and extrahepatic biliary ductal dilation to the level of the ampulla with the common  duct measuring 13 mm. No choledocholithiasis or discrete obstructing mass identified. Pancreas: No pancreatic ductal dilation or evidence of acute inflammation. Spleen:  No splenomegaly. Adrenals/Urinary Tract: Bilateral adrenal glands appear normal. No hydronephrosis. Stomach/Bowel: Percutaneous gastrostomy tube with retention balloon in the stomach. Vascular/Lymphatic: Normal caliber abdominal aorta. No pathologically enlarged abdominal lymph nodes. Other:  None. Musculoskeletal: No suspicious bone lesions identified. IMPRESSION: 1. Gallbladder is distended with wall thickening and mild pericholecystic fluid, concerning for acute cholecystitis. 2. Intra and extrahepatic biliary ductal dilation to the level of the ampulla with the common duct measuring 13 mm without discrete obstructive etiology identified, suggest correlation with laboratory values (T bili) to assess for biliary obstruction and consider further evaluation with EUS to  evaluate for biliary stricture versus MRI occult lesion. 3. T2 hyperintense 15 x 12 mm lesion in the lateral left lobe of the liver on image 10/5 demonstrates a few thin internal septations, incompletely evaluated without intravenous contrast material on this examination but on same day contrast enhanced CT demonstrated marked enhancement. Imaging characteristics between the 2 examinations are most compatible with a benign hepatic hemangioma. Attention on follow-up imaging suggested. Electronically Signed   By: Dahlia Bailiff M.D.   On: 12/08/2022 19:06    Scheduled Meds:  diclofenac Sodium  4 g Topical QID   ezetimibe  10 mg Per Tube Daily   free water  100 mL Per Tube Q4H   magnesium oxide  800 mg Per Tube BID   melatonin  3 mg Per Tube QHS   multivitamin  15 mL Per Tube Daily   pantoprazole (PROTONIX) IV  40 mg Intravenous Q12H   polyethylene glycol  17 g Per Tube Daily   senna-docusate  1 tablet Per Tube BID   thiamine  100 mg Per Tube Daily   tiZANidine  2 mg Per Tube BID   Continuous Infusions:  feeding supplement (OSMOLITE 1.5 CAL)     piperacillin-tazobactam (ZOSYN)  IV 3.375 g (12/10/22 0324)     LOS: 9 days   Shelly Coss, MD [facility] P1/29/2024, 11:19 AM

## 2022-12-10 NOTE — Progress Notes (Signed)
Progress Note     Subjective: Patient sitting up this AM. Denies RUQ abdominal pain. Reports some soreness at site of recently placed PEG. Denies nausea or vomiting. Afebrile.   Objective: Vital signs in last 24 hours: Temp:  [97.7 F (36.5 C)-98.6 F (37 C)] 98 F (36.7 C) (01/29 0641) Pulse Rate:  [84-91] 84 (01/29 0641) Resp:  [16-17] 17 (01/29 0641) BP: (106-133)/(62-75) 114/62 (01/29 0641) SpO2:  [100 %] 100 % (01/29 0641) Last BM Date : 12/09/22  Intake/Output from previous day: 01/28 0701 - 01/29 0700 In: 434.5 [I.V.:346.6; IV Piggyback:87.9] Out: 300 [Urine:300] Intake/Output this shift: No intake/output data recorded.  PE: General: pleasant, WD, chronically ill appearing female who is sitting in bed in NAD HEENT: sclera anicteric Heart: regular, rate, and rhythm.   Lungs: Respiratory effort nonlabored Abd: soft, mild ttp around PEG without concern for infection, +BS, no RUQ ttp, ND    Lab Results:  Recent Labs    12/09/22 0635 12/10/22 0541  WBC 7.9 8.4  HGB 9.0* 8.5*  HCT 27.7* 25.8*  PLT 319 340   BMET Recent Labs    12/09/22 0635 12/10/22 0541  NA 136 132*  K 3.7 3.9  CL 96* 100  CO2 26 22  GLUCOSE 110* 87  BUN 8 7*  CREATININE 0.86 0.83  CALCIUM 8.1* 8.2*   PT/INR No results for input(s): "LABPROT", "INR" in the last 72 hours. CMP     Component Value Date/Time   NA 132 (L) 12/10/2022 0541   NA 139 03/09/2022 0958   K 3.9 12/10/2022 0541   CL 100 12/10/2022 0541   CO2 22 12/10/2022 0541   GLUCOSE 87 12/10/2022 0541   BUN 7 (L) 12/10/2022 0541   BUN 9 03/09/2022 0958   CREATININE 0.83 12/10/2022 0541   CREATININE 0.74 04/27/2016 1039   CALCIUM 8.2 (L) 12/10/2022 0541   PROT 4.6 (L) 12/10/2022 0541   PROT 7.1 08/13/2018 1109   ALBUMIN 2.3 (L) 12/10/2022 0541   ALBUMIN 4.3 08/13/2018 1109   AST 90 (H) 12/10/2022 0541   ALT 103 (H) 12/10/2022 0541   ALKPHOS 169 (H) 12/10/2022 0541   BILITOT 0.6 12/10/2022 0541   BILITOT 0.4  08/13/2018 1109   GFRNONAA >60 12/10/2022 0541   GFRNONAA 87 04/27/2016 1039   GFRAA 71 12/20/2020 1433   GFRAA >89 04/27/2016 1039   Lipase     Component Value Date/Time   LIPASE 33 11/29/2022 1800       Studies/Results: MR ABDOMEN MRCP WO CONTRAST  Result Date: 12/08/2022 CLINICAL DATA:  Concern for cholecystitis with abnormal biliary ductal dilation EXAM: MRI ABDOMEN WITHOUT CONTRAST  (INCLUDING MRCP) TECHNIQUE: Multiplanar multisequence MR imaging of the abdomen was performed. Heavily T2-weighted images of the biliary and pancreatic ducts were obtained, and three-dimensional MRCP images were rendered by post processing. COMPARISON:  CT December 08, 2022 FINDINGS: Despite efforts by the technologist and patient, motion artifact is present on today's exam and could not be eliminated. This reduces exam sensitivity and specificity. Lower chest: Limited evaluation of the lungs reveals no acute abnormality. Hepatobiliary: No significant hepatic steatosis. T2 hyperintense 15 x 12 mm lesion in the lateral left lobe of the liver on image 10/5 demonstrates a few thin internal septations, incompletely evaluated without intravenous contrast material but demonstrated marked enhancement on contrast enhanced CT from same day. Gallbladder is distended with pericholecystic fluid. Intra and extrahepatic biliary ductal dilation to the level of the ampulla with the common duct measuring 13  mm. No choledocholithiasis or discrete obstructing mass identified. Pancreas: No pancreatic ductal dilation or evidence of acute inflammation. Spleen:  No splenomegaly. Adrenals/Urinary Tract: Bilateral adrenal glands appear normal. No hydronephrosis. Stomach/Bowel: Percutaneous gastrostomy tube with retention balloon in the stomach. Vascular/Lymphatic: Normal caliber abdominal aorta. No pathologically enlarged abdominal lymph nodes. Other:  None. Musculoskeletal: No suspicious bone lesions identified. IMPRESSION: 1. Gallbladder  is distended with wall thickening and mild pericholecystic fluid, concerning for acute cholecystitis. 2. Intra and extrahepatic biliary ductal dilation to the level of the ampulla with the common duct measuring 13 mm without discrete obstructive etiology identified, suggest correlation with laboratory values (T bili) to assess for biliary obstruction and consider further evaluation with EUS to evaluate for biliary stricture versus MRI occult lesion. 3. T2 hyperintense 15 x 12 mm lesion in the lateral left lobe of the liver on image 10/5 demonstrates a few thin internal septations, incompletely evaluated without intravenous contrast material on this examination but on same day contrast enhanced CT demonstrated marked enhancement. Imaging characteristics between the 2 examinations are most compatible with a benign hepatic hemangioma. Attention on follow-up imaging suggested. Electronically Signed   By: Dahlia Bailiff M.D.   On: 12/08/2022 19:06   MR 3D Recon At Scanner  Result Date: 12/08/2022 CLINICAL DATA:  Concern for cholecystitis with abnormal biliary ductal dilation EXAM: MRI ABDOMEN WITHOUT CONTRAST  (INCLUDING MRCP) TECHNIQUE: Multiplanar multisequence MR imaging of the abdomen was performed. Heavily T2-weighted images of the biliary and pancreatic ducts were obtained, and three-dimensional MRCP images were rendered by post processing. COMPARISON:  CT December 08, 2022 FINDINGS: Despite efforts by the technologist and patient, motion artifact is present on today's exam and could not be eliminated. This reduces exam sensitivity and specificity. Lower chest: Limited evaluation of the lungs reveals no acute abnormality. Hepatobiliary: No significant hepatic steatosis. T2 hyperintense 15 x 12 mm lesion in the lateral left lobe of the liver on image 10/5 demonstrates a few thin internal septations, incompletely evaluated without intravenous contrast material but demonstrated marked enhancement on contrast  enhanced CT from same day. Gallbladder is distended with pericholecystic fluid. Intra and extrahepatic biliary ductal dilation to the level of the ampulla with the common duct measuring 13 mm. No choledocholithiasis or discrete obstructing mass identified. Pancreas: No pancreatic ductal dilation or evidence of acute inflammation. Spleen:  No splenomegaly. Adrenals/Urinary Tract: Bilateral adrenal glands appear normal. No hydronephrosis. Stomach/Bowel: Percutaneous gastrostomy tube with retention balloon in the stomach. Vascular/Lymphatic: Normal caliber abdominal aorta. No pathologically enlarged abdominal lymph nodes. Other:  None. Musculoskeletal: No suspicious bone lesions identified. IMPRESSION: 1. Gallbladder is distended with wall thickening and mild pericholecystic fluid, concerning for acute cholecystitis. 2. Intra and extrahepatic biliary ductal dilation to the level of the ampulla with the common duct measuring 13 mm without discrete obstructive etiology identified, suggest correlation with laboratory values (T bili) to assess for biliary obstruction and consider further evaluation with EUS to evaluate for biliary stricture versus MRI occult lesion. 3. T2 hyperintense 15 x 12 mm lesion in the lateral left lobe of the liver on image 10/5 demonstrates a few thin internal septations, incompletely evaluated without intravenous contrast material on this examination but on same day contrast enhanced CT demonstrated marked enhancement. Imaging characteristics between the 2 examinations are most compatible with a benign hepatic hemangioma. Attention on follow-up imaging suggested. Electronically Signed   By: Dahlia Bailiff M.D.   On: 12/08/2022 19:06   CT ABDOMEN PELVIS W CONTRAST  Result Date: 12/08/2022 CLINICAL DATA:  Bowel obstruction suspected.  Dysphagia. EXAM: CT ABDOMEN AND PELVIS WITH CONTRAST TECHNIQUE: Multidetector CT imaging of the abdomen and pelvis was performed using the standard protocol  following bolus administration of intravenous contrast. RADIATION DOSE REDUCTION: This exam was performed according to the departmental dose-optimization program which includes automated exposure control, adjustment of the mA and/or kV according to patient size and/or use of iterative reconstruction technique. CONTRAST:  172m OMNIPAQUE IOHEXOL 300 MG/ML  SOLN COMPARISON:  CT scan of the abdomen and pelvis May 06, 2013. CT scan of the abdomen and pelvis November 30, 2022. FINDINGS: Lower chest: No acute abnormality. Hepatobiliary: Intra and extrahepatic biliary duct dilatation is identified, new compared to May 06, 2013 and not well evaluated on the November 30, 2022 CT scan without contrast. No discrete filling defects are identified in the common bile duct. The gallbladder is markedly distended with no adjacent stranding or obvious stones. Pericholecystic fluid is noted however. Focal fatty deposition adjacent to the falciform ligament. No liver mass identified. Pancreas: Unremarkable. No pancreatic ductal dilatation or surrounding inflammatory changes. Spleen: Normal in size without focal abnormality. Adrenals/Urinary Tract: Adrenal glands are normal. A 3 mm stone is identified in the mid right kidney without hydronephrosis. The kidneys are otherwise normal. The ureters and bladder are unremarkable. Stomach/Bowel: A PEG tube is appropriately located with the balloon and distal tip in the stomach. There is soft tissue thickening with central fat anterior and inferior to the gastric antrum as seen on series 2, images 30 through 35. The finding is similar in appearance to the May 06, 2013 study. The small bowel is decompressed without obvious abnormality. There is contrast throughout the length of the colon with no colonic abnormality identified. Appendix is not visualized consistent with history of appendectomy. Vascular/Lymphatic: Calcified atherosclerotic changes are identified in the nonaneurysmal aorta,  extending into the iliac vessels. No adenopathy. Reproductive: Uterus and bilateral adnexa are unremarkable. Other: No abdominal wall hernia or abnormality. No abdominopelvic ascites. Musculoskeletal: No acute or significant osseous findings. IMPRESSION: 1. Intra and extrahepatic biliary duct dilatation is identified, new compared to May 06, 2013 and not well evaluated on the November 30, 2022 CT scan without contrast. The gallbladder is markedly distended with pericholecystic fluid. There is no adjacent stranding or obvious stones. Recommend an MRCP or ERCP for better evaluation. 2. There is soft tissue thickening with central fat anterior and inferior to the gastric antrum as seen on series 2, images 30 through 35. The finding is similar in appearance to the May 06, 2013 study. The findings could represent sequela of a previous GPhillip Healpatch or previous ulcerative disease. 3. 3 mm nonobstructive stone in the right kidney. 4. Calcified atherosclerotic changes in the nonaneurysmal aorta extending into the iliac vessels. 5. Aortic atherosclerosis. Aortic Atherosclerosis (ICD10-I70.0). Electronically Signed   By: DDorise BullionIII M.D.   On: 12/08/2022 12:08    Anti-infectives: Anti-infectives (From admission, onward)    Start     Dose/Rate Route Frequency Ordered Stop   12/08/22 2000  piperacillin-tazobactam (ZOSYN) IVPB 3.375 g        3.375 g 12.5 mL/hr over 240 Minutes Intravenous Every 8 hours 12/08/22 1900     12/07/22 1130  cefadroxil (DURICEF) capsule 500 mg  Status:  Discontinued        500 mg Oral 2 times daily 12/07/22 1033 12/07/22 1033   12/07/22 1130  cefadroxil (DURICEF) capsule 500 mg  Status:  Discontinued        500 mg Oral  2 times daily 12/07/22 1033 12/07/22 1034   12/07/22 1130  cefadroxil (DURICEF) capsule 500 mg  Status:  Discontinued        500 mg Per Tube 2 times daily 12/07/22 1034 12/08/22 1903   12/03/22 0700  ceFAZolin (ANCEF) IVPB 2g/100 mL premix        2 g 200 mL/hr  over 30 Minutes Intravenous To Radiology 11/30/22 1602 12/04/22 0700        Assessment/Plan  Dysphagia s/p IR PEG 1/22 Elevated LFTs, possible acalculous cholecystitis  - CT1/27 with intra and extrahepatic biliary dilatation, some mild pericholecystic fluid without adjacent stranding or stones, soft tissue thickening to gastric antrum - question hx of ulcerative disease or graham patch - MRCP 1/27 with gallbladder wall thickening and pericholecystic fluid, biliary ductal dilation to level of ampulla without discrete obstructive etiology, Left lateral liver lesion thought to likely be benign hemangioma - no leukocytosis and no RUQ ttp on exam, afebrile - Tbili and LFTs are all trending down - I do not feel strongly that patient has acalculous cholecystitis but given other comorbidities not unreasonable to cover with abx for a week. Given absence of stones should respond well to medical management - no other surgical recommendations at this time, we will sign off. Please call if any further concerns or we can be of further assistance   FEN: ok to have PO diet or TF from surgery standpoint  VTE: SCDs ID: [email or web address] 1/27>>  LOS: 9 days   I reviewed hospitalist notes, last 24 h vitals and pain scores, last 48 h intake and output, last 24 h labs and trends, and last 24 h imaging results.   Norm Parcel, [facility] Surgery 12/10/2022, 9:04 AM Please see Amion for pager number during day hours 7:00am-4:30pm

## 2022-12-10 NOTE — TOC Progression Note (Addendum)
Transition of Care [facility]) - Progression Note    Patient Details  Name: Kara Bailey MRN: [id number] Date of Birth: [birth year]  Transition of Care [facility]) CM/SW [place], RN Phone Number:[phone]  12/10/2022, 10:04 AM  Clinical Narrative:    CM  called [facility] to follow up on insurance auth for SNF. Spoke with Bethena Roys who is actively attempting to reach [facility]. Will await return call.   Per MD will plan for tomorrow due to starting tube feeds.         Expected Discharge Plan and Services         Expected Discharge Date: 12/07/22                                     Social Determinants of Health (SDOH) Interventions SDOH Screenings   Food Insecurity: Unknown (12/04/2022)  Housing: Low Risk  (12/04/2022)  Transportation Needs: Unknown (12/04/2022)  Utilities: Unknown (12/04/2022)  Depression (PHQ2-9): High Risk (09/19/2022)  Tobacco Use: High Risk (12/03/2022)    Readmission Risk Interventions     No data to display

## 2022-12-10 NOTE — Progress Notes (Signed)
Nutrition Follow-up  DOCUMENTATION CODES:   Severe malnutrition in context of chronic illness  INTERVENTION:   -Continue Osmolite 1.5, restarting at 20 ml/hr, advance by 10 ml every 4 hours to goal rate 55 ml/hr. -Free water: 100 ml every 4 hours (600 ml) -Goal provides 1980 kcals, 82g protein and 1605 ml H2O   -D/c Ensure -pt reports this is upsetting her stomach  -Multivitamin with minerals daily via tube -Continue Thiamine given refeeding risk  NUTRITION DIAGNOSIS:   Severe Malnutrition related to chronic illness, dysphagia (h/o CVA) as evidenced by moderate fat depletion, severe muscle depletion, energy intake < or equal to 75% for > or equal to 1 month.  Ongoing.  GOAL:   Patient will meet greater than or equal to 90% of their needs  Not meeting but TF restarting today.  MONITOR:   PO intake, Supplement acceptance, Labs, Weight trends, I & O's  ASSESSMENT:   67 year old African-American female history of multiple strokes, dense left hemiparesis, hypertension who presents to the ER today with worsening dysphagia.  1/22: PEG placed by IR  Per chart review, pt's tube feeding was held all weekend d/t 2 episodes of vomiting on 1/27. Per surgery today, tube feeds can be resumed.   Pt up in chair during visit and states she feels better today.  Reports she feels bad after receiving Ensure but when she was on Osmolite she didn't feel that way. Will d/c Ensure and restart Osmolite 1.5 (available per Pharmacy now). Reviewed plan with RN.  Admission weight: 112 lbs. Current weight: 110 lbs  Admission weight:   Medications: MAG-OX, MVI, Miralax, Senokot, Thiamine Labs reviewed:   Labs reviewed: CBGs: 84-106 Low Na  Diet Order:   Diet Order             Diet full liquid Room service appropriate? Yes; Fluid consistency: Thin  Diet effective now           Diet general                   EDUCATION NEEDS:   Education needs have been addressed  Skin:  Skin  Assessment: Reviewed RN Assessment  Last BM:  1/28 -type 6  Height:   Ht Readings from Last 1 Encounters:  11/30/22 '5\' 6"'$  (1.676 m)    Weight:   Wt Readings from Last 1 Encounters:  12/09/22 50.3 kg    BMI:  Body mass index is 17.9 kg/m.  Estimated Nutritional Needs:   Kcal:  4665-9935  Protein:  80-95g  Fluid:  1.8L/day  Clayton Bibles, MS, RD, LDN Inpatient Clinical Dietitian Contact information available via Amion

## 2022-12-11 DIAGNOSIS — I69391 Dysphagia following cerebral infarction: Secondary | ICD-10-CM | POA: Diagnosis not present

## 2022-12-11 LAB — COMPREHENSIVE METABOLIC PANEL
ALT: 69 U/L — ABNORMAL HIGH (ref 0–44)
AST: 43 U/L — ABNORMAL HIGH (ref 15–41)
Albumin: 2.5 g/dL — ABNORMAL LOW (ref 3.5–5.0)
Alkaline Phosphatase: 158 U/L — ABNORMAL HIGH (ref 38–126)
Anion gap: 8 (ref 5–15)
BUN: 10 mg/dL (ref 8–23)
CO2: 23 mmol/L (ref 22–32)
Calcium: 8.1 mg/dL — ABNORMAL LOW (ref 8.9–10.3)
Chloride: 102 mmol/L (ref 98–111)
Creatinine, Ser: 0.86 mg/dL (ref 0.44–1.00)
GFR, Estimated: >60 mL/min (ref >60)
Glucose, Bld: 121 mg/dL — ABNORMAL HIGH (ref 70–99)
Potassium: 3.4 mmol/L — ABNORMAL LOW (ref 3.5–5.1)
Sodium: 133 mmol/L — ABNORMAL LOW (ref 135–145)
Total Bilirubin: 0.4 mg/dL (ref 0.3–1.2)
Total Protein: 5.2 g/dL — ABNORMAL LOW (ref 6.5–8.1)

## 2022-12-11 LAB — GLUCOSE, CAPILLARY
Glucose-Capillary: 120 mg/dL — ABNORMAL HIGH (ref 70–99)
Glucose-Capillary: 117 mg/dL — ABNORMAL HIGH (ref 70–99)
Glucose-Capillary: 133 mg/dL — ABNORMAL HIGH (ref 70–99)
Glucose-Capillary: 110 mg/dL — ABNORMAL HIGH (ref 70–99)

## 2022-12-11 LAB — CBC
HCT: 28 % — ABNORMAL LOW (ref 36.0–46.0)
Hemoglobin: 8.9 g/dL — ABNORMAL LOW (ref 12.0–15.0)
MCH: 28.5 pg (ref 26.0–34.0)
MCHC: 31.8 g/dL (ref 30.0–36.0)
MCV: 89.7 fL (ref 80.0–100.0)
Platelets: 375 10*3/uL (ref 150–400)
RBC: 3.12 MIL/uL — ABNORMAL LOW (ref 3.87–5.11)
RDW: 17.6 % — ABNORMAL HIGH (ref 11.5–15.5)
WBC: 8.2 10*3/uL (ref 4.0–10.5)
nRBC: 0 % (ref 0.0–0.2)

## 2022-12-11 MED ORDER — LOPERAMIDE HCL 2 MG PO CAPS
2.0000 mg | ORAL_CAPSULE | Freq: Three times a day (TID) | ORAL | Status: DC | PRN
  Administered 2022-12-11 – 2022-12-19 (×13): 2 mg via ORAL
  Filled 2022-12-11 (×14): qty 1

## 2022-12-11 MED ORDER — AMOXICILLIN-POT CLAVULANATE 400-57 MG/5ML PO SUSR
875.0000 mg | Freq: Two times a day (BID) | ORAL | Status: AC
  Administered 2022-12-11 – 2022-12-18 (×14): 875 mg
  Filled 2022-12-11 (×14): qty 10.9

## 2022-12-11 MED ORDER — POTASSIUM CHLORIDE 20 MEQ PO PACK
40.0000 meq | PACK | Freq: Once | ORAL | Status: AC
  Administered 2022-12-11: 40 meq via ORAL
  Filled 2022-12-11: qty 2

## 2022-12-11 MED ORDER — AMOXICILLIN-POT CLAVULANATE 875-125 MG PO TABS
1.0000 | ORAL_TABLET | Freq: Two times a day (BID) | ORAL | Status: DC
  Administered 2022-12-11: 1 via ORAL
  Filled 2022-12-11: qty 1

## 2022-12-11 NOTE — Progress Notes (Signed)
Occupational Therapy Treatment Patient Details Name: Kara Bailey MRN: [id number] DOB: 11-Nov-1956 Today's Date: 12/11/2022   History of present illness Patient is 67 year old female admitted 11/29/22 with multifactorial dysphagia with plan for PEG tube; s/p G tube 12-03-2022. Pt with hx of CVA with L weakness, HTN, iliac stent, hep C, GERD   OT comments  Patient was instructed on using her unaffected RUE to perform the LUE ROM and stretches at chair level, in order to decrease the  risk for progressive tone abnormalities and to promote improved joint flexibility. She performed multiple reps of elbow extension, wrist extension, digit extension, and shoulder flexion, requiring intermittent verbal and demonstrational cues for correct form/technique. She was also instructed on sit to stand transfers from the chair, as safe functional transfers are warranted to prep her for progressive ADL participation; she required max assist to perform, however she was able to stand for ~30 seconds during each stand. She further performed grooming and initiated self-feeding with set-up assist seated in the bedside chair. She presented with good participation. Continue OT plan of care.     Recommendations for follow up therapy are one component of a multi-disciplinary discharge planning process, led by the attending physician.  Recommendations may be updated based on patient status, additional functional criteria and insurance authorization.    Follow Up Recommendations  Skilled nursing-short term rehab (<3 hours/day)     Assistance Recommended at Discharge Frequent or constant Supervision/Assistance  Patient can return home with the following  Two people to help with walking and/or transfers;A lot of help with bathing/dressing/bathroom;Assistance with cooking/housework;Direct supervision/assist for medications management;Assist for transportation;Help with stairs or ramp for entrance   Equipment  Recommendations  Other (comment) (to be determined pending progress at next setting)       Precautions / Restrictions Precautions Precautions: Fall Precaution Comments: LUE and LLE weakness       Mobility Bed Mobility      General bed mobility comments: pt was received seated in bedside chair    Transfers Overall transfer level: Needs assistance Equipment used: 1 person hand held assist Transfers: Sit to/from Stand Sit to Stand: Max assist           General transfer comment: Pt was instructed on sit to stand from bedside chair. She required max assist, with instruction for RLE positioning, RUE placement on chair rail and trunk shifting forward, in order to stand. She stood for ~30 seconds during each stand, requiring hand hold assist on the R and cues for trunk extension and increasing BLE BOS.         ADL either performed or assessed with clinical judgement   ADL Overall ADL's : Needs assistance/impaired Eating/Feeding: Sitting;Set up Eating/Feeding Details (indicate cue type and reason): Pt initiated self-feeding in sitting at chair level, requiring assist to open packets and remove lids, given LUE weakness. Grooming: Set up;Sitting Grooming Details (indicate cue type and reason): She performed face washing and teeth brushing seated at chair level using RUE/ implementing 1 handed compensatory techniques, given LUE weakness. She required assist to wring out washclothe, apply toothpaste to brush, and open lids.         Upper Body Dressing : Maximal assistance Upper Body Dressing Details (indicate cue type and reason): simulated in sitting                          Cognition Arousal/Alertness: Awake/alert Behavior During Therapy: WFL for tasks assessed/performed Overall Cognitive  Status: Within Functional Limits for tasks assessed            Exercises  OT instructed on implementing self-performed LUE stretches and ROM, given significant LUE weakness  which she stated began ~4 months ago after a CVA. She was instructed on using her unaffected RUE to perform the LUE ROM and stretches at chair level, in order to decrease there risk for progressive tone abnormalities and to promote improved joint flexibility. She performed multiple reps of elbow extension, wrist extension, digit extension, and shoulder flexion, requiring intermittent verbal and demonstrational cues for correct form/technique.             Pertinent Vitals/ Pain       Pain Assessment Pain Assessment: No/denies pain  Home Living     Available Help at Discharge: [place] Type of Home: House              Frequency  Min 2X/week        Progress Toward Goals  OT Goals(current goals can now be found in the care plan section)  Progress towards OT goals: Progressing toward goals  Acute Rehab OT Goals Patient Stated Goal: to go to rehab and to make max functional gains OT Goal Formulation: With patient Time For Goal Achievement: 12/18/22 Potential to Achieve Goals: [place] Discharge plan remains appropriate       AM-PAC OT "6 Clicks" Daily Activity     Outcome Measure   Help from another person eating meals?: A Little Help from another person taking care of personal grooming?: A Little Help from another person toileting, which includes using toliet, bedpan, or urinal?: A Lot Help from another person bathing (including washing, rinsing, drying)?: A Lot Help from another person to put on and taking off regular upper body clothing?: A Lot Help from another person to put on and taking off regular lower body clothing?: A Lot 6 Click Score: 14    End of Session Equipment Utilized During Treatment: Gait belt  OT Visit Diagnosis: Unsteadiness on feet (R26.81);Muscle weakness (generalized) (M62.81)   Activity Tolerance Patient tolerated treatment well   Patient Left in chair;with call bell/phone within reach   Nurse Communication Mobility  status        Time: 8144-8185 OT Time Calculation (min): 28 min  Charges: OT General Charges $OT Visit: 1 Visit OT Treatments $Self Care/Home Management : 8-22 mins $Therapeutic Activity: 8-22 mins     Leota Sauers, OTR/L 12/11/2022, 2:17 PM

## 2022-12-11 NOTE — Plan of Care (Signed)
Patient Kara Bailey, VSS throughout shift.  Pt has a little slurred speech and left arm contracted and moves slightly.  Left leg moves a little.  All meds given on time as ordered via G-tube.  G-tube dressing intact. Osmolite 1.5 Tfs at 30m/hr now.  Tfs will be at goal at 0800, 534mhr.  Diminished lungs, IS taught and encouraged.  POC maintained, will continue to monitor.  Problem: Education: Goal: Knowledge of General Education information will improve Description: Including pain rating scale, medication(s)/side effects and non-pharmacologic comfort measures Outcome: Progressing   Problem: Health Behavior/Discharge Planning: Goal: Ability to manage health-related needs will improve Outcome: Progressing   Problem: Clinical Measurements: Goal: Ability to maintain clinical measurements within normal limits will improve Outcome: Progressing Goal: Will remain free from infection Outcome: Progressing Goal: Diagnostic test results will improve Outcome: Progressing Goal: Respiratory complications will improve Outcome: Progressing Goal: Cardiovascular complication will be avoided Outcome: Progressing   Problem: Activity: Goal: Risk for activity intolerance will decrease Outcome: Progressing   Problem: Nutrition: Goal: Adequate nutrition will be maintained Outcome: Progressing   Problem: Coping: Goal: Level of anxiety will decrease Outcome: Progressing   Problem: Elimination: Goal: Will not experience complications related to bowel motility Outcome: Progressing Goal: Will not experience complications related to urinary retention Outcome: Progressing   Problem: Pain Managment: Goal: General experience of comfort will improve Outcome: Progressing   Problem: Safety: Goal: Ability to remain free from injury will improve Outcome: Progressing   Problem: Skin Integrity: Goal: Risk for impaired skin integrity will decrease Outcome: Progressing

## 2022-12-11 NOTE — TOC Progression Note (Signed)
Transition of Care [facility]) - Progression Note    Patient Details  Name: Kara Bailey MRN: [id number] Date of Birth: September 18, 1956  Transition of Care [facility]) CM/SW [place], RN Phone [phone]  12/11/2022, 9:44 AM  Clinical Narrative:    CM spoke with Bethena Roys in admissions for [facility]. Josem Kaufmann is still pending. Bethena Roys to follow up and will update CM asap.         Expected Discharge Plan and Services         Expected Discharge Date: 12/07/22                                     Social Determinants of Health (SDOH) Interventions SDOH Screenings   Food Insecurity: Unknown (12/04/2022)  Housing: Low Risk  (12/04/2022)  Transportation Needs: Unknown (12/04/2022)  Utilities: Unknown (12/04/2022)  Depression (PHQ2-9): High Risk (09/19/2022)  Tobacco Use: High Risk (12/03/2022)    Readmission Risk Interventions     No data to display

## 2022-12-11 NOTE — Evaluation (Signed)
Speech Language Pathology Evaluation Patient Details Name: Kara Bailey MRN: [id number] DOB: Apr 03, 1956 Today's Date: 12/11/2022 Time: 1135-1200 SLP Time Calculation (min) (ACUTE ONLY): 25 min  Problem List:  Patient Active Problem List   Diagnosis Date Noted   Protein-calorie malnutrition, severe 12/04/2022   History of stroke 11/29/2022   Dysphagia as late effect of cerebrovascular accident (CVA) 11/29/2022   Stroke ([place]) 07/26/2022   Left hemiplegia ([place]) 07/26/2022   Peripheral arterial disease ([place]) 12/20/2020   History of esophageal stricture 07/08/2020   Insomnia 12/02/2017   Chronic cough 08/14/2017   Poor appetite 08/29/2016   Peptic stricture of esophagus    History of colonic polyps    Hiatal hernia 03/16/2016   Dysphagia 03/16/2016   Alcohol abuse 10/26/2015   Essential hypertension 09/26/2015   PAC (premature atrial contraction) 10/11/2014   PVC (premature ventricular contraction) 10/11/2014   Back muscle spasm 06/15/2014   Generalized anxiety disorder 06/15/2014   Smoking 06/15/2014   Chronic hepatitis C without hepatic coma ([place]) 05/28/2013   Past Medical History:  Past Medical History:  Diagnosis Date   Acute CVA (cerebrovascular accident) ([place]) 12/29/2020   Anxiety    Chest pain 08/2014   unspecified   Cirrhosis ([place])    CVA (cerebral vascular accident) ([place]) 07/27/2022   Depression    Dizziness 08/2014   Dyspnea 08/2014   Dysrhythmia    Emphysema lung (HCC)    GERD (gastroesophageal reflux disease)    Heart murmur    Hepatitis C    Hiatal hernia    Hypertension    Ischemic stroke ([place]) 08/01/2022   PAC (premature atrial contraction) 10/11/2014   PAD (peripheral artery disease) (HCC)    Palpitations    PVC (premature ventricular contraction) 10/11/2014   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   Stroke ([place])    just slurred speech   Tachycardia 08/2014   Ventral hernia    Weakness 08/2014   Past Surgical History:  Past  Surgical History:  Procedure Laterality Date   ABDOMINAL AORTOGRAM W/LOWER EXTREMITY N/A 07/13/2021   Procedure: ABDOMINAL AORTOGRAM W/LOWER EXTREMITY;  Surgeon: Lorretta Harp, MD;  Location: [place] [facility];  Service: Cardiovascular;  Laterality: N/A;   ABDOMINAL AORTOGRAM W/LOWER EXTREMITY N/A 03/19/2022   Procedure: ABDOMINAL AORTOGRAM W/LOWER EXTREMITY;  Surgeon: Lorretta Harp, MD;  Location: [place] [facility];  Service: Cardiovascular;  Laterality: N/A;   APPENDECTOMY     CATARACT EXTRACTION, BILATERAL     COLONOSCOPY     COLONOSCOPY WITH PROPOFOL N/A 04/30/2016   Procedure: COLONOSCOPY WITH PROPOFOL;  Surgeon: Daneil Dolin, MD;  [facility];  Service: Endoscopy;  Laterality: N/A;  [place] N/A 10/13/2021   Procedure: OPEN REPAIR EPIGASTRIC HERNIA WITH MESH PATCH;  Surgeon: Armandina Gemma, MD;  Location: [facility];  Service: General;  Laterality: N/A;   ESOPHAGOGASTRODUODENOSCOPY     approximately 2010   ESOPHAGOGASTRODUODENOSCOPY (EGD) WITH PROPOFOL N/A 04/30/2016   Procedure: ESOPHAGOGASTRODUODENOSCOPY (EGD) WITH PROPOFOL;  Surgeon: Daneil Dolin, MD;  [facility];  Service: Endoscopy;  Laterality: N/A;   IR GASTROSTOMY TUBE MOD SED  12/03/2022   LEFT HEART CATHETERIZATION WITH CORONARY ANGIOGRAM N/A 10/29/2014   07-14-20- pt denies this Procedure: LEFT HEART CATHETERIZATION WITH CORONARY ANGIOGRAM;  Surgeon: Burnell Blanks, MD;  [name] C[facility];  Service: Cardiovascular;  Laterality: N/A;   MALONEY DILATION N/A 04/30/2016   Procedure: Venia Minks DILATION;  Surgeon: Daneil Dolin, MD;  [facility];  Service: Endoscopy;  Laterality: N/A;   PERIPHERAL VASCULAR INTERVENTION  07/13/2021   Procedure: PERIPHERAL VASCULAR INTERVENTION;  Surgeon: Lorretta Harp, MD;  Location: [place] [facility];  Service: Cardiovascular;;  left SFA left external iliac   PERIPHERAL VASCULAR INTERVENTION Right 03/19/2022   Procedure:  PERIPHERAL VASCULAR INTERVENTION;  Surgeon: Lorretta Harp, MD;  Location: [place] [facility];  Service: Cardiovascular;  Laterality: Right;  SFA   POLYPECTOMY  04/30/2016   Procedure: POLYPECTOMY;  Surgeon: Daneil Dolin, MD;  [facility];  Service: Endoscopy;;  Sigmoid colon polyp removed via hot snare   UPPER GASTROINTESTINAL ENDOSCOPY     HPI:  Kara Bailey is a 67 y.o. female who presented to [facility] ED on 11/29/22 with worsening dysphagia c/b inability to swallow pills or solid foods. She was sent to ED by PCP for PEG placement (PEG 12/03/22). PMHx includes CVAs, dysarthria,  first stroke seen in [facility] is 2022, HTN, prior epigastric hernia repair, iliac stent. Pt has a long hx of dysphagia and has worked with SLP in acute, inpatient, and OP settings over two years. She has had multiple MBS studies. 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  Pt's dysphagia has been primarily oral (difficulty with onset of oral phase and oral control) but pharyngeal function has been largely intact with good bolus clearance and adequate airway protection. Review of 11/30/22 esophagram reveals similar mechanics of swallow. 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   Assessment / Plan / Recommendation Clinical Impression  Patient seen to address her severe dysarthria and initiate AAC on her Android tablet for efficient expressive communication.  Her dysarthria today is worse than prior Thursday and SLP suspect fatigue is a big factor for her as she was falling alseep during the session.  "Spoken" AAC program downloaded to pt's tablet but SLP was unable to obtain sound on the device, it worked on [employer]?  Therefore, downloaded another AAC program on her phone called "Speech Assistant" - with ability to  produce voice. Pt demonstrated basic use of single phrase with mod assist and teach back.  Encouraged her to practice using the device and save phrases that she frequently repeats.  Will follow up- as she is very motivated to improve but is frustrated and has anxiety. She continues to benefit from max verbal and visual cues to speak loudly and slowly!    SLP Assessment  SLP Recommendation/Assessment: Patient needs continued Speech [place] Pathology Services SLP Visit Diagnosis: Dysarthria and anarthria (R47.1)    Recommendations for follow up therapy are one component of a multi-disciplinary discharge planning process, led by the attending physician.  Recommendations may be updated based on patient status, additional functional criteria and insurance authorization.    Follow Up Recommendations  Skilled nursing-short term rehab (<3 hours/day)    Assistance Recommended at Discharge  Intermittent Supervision/Assistance  Functional Status Assessment Patient has had a recent decline in their functional status and demonstrates the ability to make significant improvements in function in a reasonable and predictable amount of time.  Frequency and Duration min 1 x/week         SLP Evaluation Cognition  Overall Cognitive Status: Within Functional Limits for tasks assessed Memory: Appears intact (for short term recall of items SLP to bring to pt) Awareness: Appears  intact Problem Solving: Appears intact Safety/Judgment: Appears intact       Comprehension  Auditory Comprehension Overall Auditory Comprehension: Appears within functional limits for tasks assessed Yes/No Questions: Not tested Commands: Within Functional Limits Conversation: Complex Interfering Components: Motor planning EffectiveTechniques: Repetition Visual Recognition/Discrimination Discrimination: Not tested Reading Comprehension Reading Status: Within funtional limits (for AAC program)    Expression Expression Primary  Mode of Expression: Verbal (also starting AAC program on her tablet to faciliate) Verbal Expression Overall Verbal Expression: Impaired Initiation: No impairment Repetition:  (NT) Pragmatics: No impairment Non-Verbal Means of Communication: Communication board;Other (comment) (tablet initiated) Written Expression Dominant Hand: Right Written Expression: Not tested   Oral / Motor  Oral Motor/Sensory Function Overall Oral Motor/Sensory Function: Severe impairment Facial ROM: Reduced right;Reduced left Facial Symmetry: Abnormal symmetry left Facial Strength: Reduced left Facial Sensation: Reduced left Lingual ROM: Reduced right;Reduced left;Suspected CN XII (hypoglossal) dysfunction Lingual Symmetry: Suspected CN XII (hypoglossal) dysfunction (poor extension) Lingual Strength: Reduced;Suspected CN XII (hypoglossal) dysfunction Lingual Sensation: Reduced;Suspected CN VII (facial) dysfunction-anterior 2/3 tongue Mandible: Within Functional Limits Motor Speech Overall Motor Speech: Impaired Respiration: Impaired Level of Impairment: Word Phonation: Low vocal intensity Articulation: Impaired Level of Impairment: Word Intelligibility: Intelligibility reduced Word: 50-74% accurate Phrase: 50-74% accurate Sentence: 25-49% accurate Conversation: Not tested Motor Planning: Impaired Level of Impairment: Word Motor Speech Errors: Inconsistent;Unaware Interfering Components: Inadequate dentition;Premorbid status Effective Techniques: Increased vocal intensity;Over-articulate            Macario Golds 12/11/2022, 12:36 PM Kathleen Lime, MS [facility] SLP Acute Rehab Services Office [phone]

## 2022-12-11 NOTE — Progress Notes (Signed)
PROGRESS NOTE  Kara Bailey  LGX:[id number] DOB: 02-10-1956 DOA: 11/29/2022 PCP: Andree Moro, DO   Brief Narrative: Patient is a 67 year old female with history of CVA, hypertension presented the emergency department at the request of her PCP for the evaluation of persistent dysphagia.  Found to have multifactorial dysphagia likely from stroke.  Status post G-tube placement by IR.  PT/OT recommending SNF on discharge.  Developed nausea, vomiting, abdominal pain on 1/26,  CT abdomen/pelvis showed possible cholecystitis, MRCP confirmed acute cholecystitis.  General surgery was  following.  Significant improvement abdominal pain, nausea, vomiting, liver enzymes.  Plan is to continue antibiotics without any operative intervention.  Diet started and she is tolerating.  Medically stable for discharge to SNF whenever possible,auth is pending  Assessment & Plan:  Principal Problem:   Dysphagia as late effect of cerebrovascular accident (CVA) Active Problems:   Essential hypertension   Left hemiplegia (HCC)   History of stroke   Dysphagia   Protein-calorie malnutrition, severe  Dysphagia: Likely secondary to residual effect of the ischemic stroke.  Has history of esophageal stricture last dilated on 2021.  GI consulted and was following.  Barium swallow done on 11/30/2022 was nondiagnostic.  Underwent G-tube placement by IR on 1/22.  Underwent MBS again  speech therapy to check if she can swallow orally.Started on full liquid diet. Continue tube feeding diet, full liquid diet.   Abdomen pain, nausea, vomiting/acute cholecystitis: Likely acalculous cholecystitis.  CT abdomen/pelvis, MRCP confirmed acute cholecystitis without stones.  Patient was complaining of   periumbilical abdominal pain but now it has resolved.  Abdomen is benign on examination: Soft, nondistended, nontender with good bowel sounds.  No nausea or vomiting today.  Liver enzymes were elevated, now significantly trending down. .   General surgery following signed off.  Abx changed to Augmentin .  Severe protein calorie malnutrition: Nutrition was following.  BMI of 18.  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  On Plavix, aspirin, Lipitor, Zetia.  Also on baclofen and Zanaflex for left-sided hemiplegia and spasticity  Hypertension: Currently blood pressure soft/stable.  Not on any medications  Hypomagnesemia/hypokalemia: Continue magnesium supplementation.  Depression: On celexa  Normocytic anemia: No evidence of acute blood loss.  Currently hemoglobin stable in the range of 8-9  Debility/deconditioning: PT/OT recommending SNF on discharge.TOC following      Nutrition Problem: Severe Malnutrition Etiology: chronic illness, dysphagia (h/o CVA)    DVT prophylaxis:SCDs Start: 11/30/22 0151     Code Status: Full Code  Family Communication: Called daughter [name] on phone on 1/29  Patient status:Inpatient  Patient is from :Home  Anticipated discharge to:SnF  Estimated DC date: as soon as bed is available   Consultants: GI,IR,surgery  Procedures:G tube placement   Antimicrobials:  Anti-infectives (From admission, onward)    Start     Dose/Rate Route Frequency Ordered Stop   12/11/22 2200  amoxicillin-clavulanate (AUGMENTIN) 400-57 MG/5ML suspension 875 mg        875 mg Per Tube Every 12 hours 12/11/22 1137     12/11/22 1000  amoxicillin-clavulanate (AUGMENTIN) 875-125 MG per tablet 1 tablet  Status:  Discontinued        1 tablet Oral Every 12 hours 12/11/22 0821 12/11/22 1137   12/08/22 2000  piperacillin-tazobactam (ZOSYN) IVPB 3.375 g  Status:  Discontinued        3.375 g 12.5 mL/hr over 240 Minutes Intravenous Every 8 hours 12/08/22 1900 12/11/22 0821   12/07/22 1130  cefadroxil (DURICEF) capsule 500 mg  Status:  Discontinued        500 mg Oral 2 times daily 12/07/22 1033 12/07/22 1033   12/07/22 1130  cefadroxil (DURICEF) capsule  500 mg  Status:  Discontinued        500 mg Oral 2 times daily 12/07/22 1033 12/07/22 1034   12/07/22 1130  cefadroxil (DURICEF) capsule 500 mg  Status:  Discontinued        500 mg Per Tube 2 times daily 12/07/22 1034 12/08/22 1903   12/03/22 0700  ceFAZolin (ANCEF) IVPB 2g/100 mL premix        2 g 200 mL/hr over 30 Minutes Intravenous To Radiology 11/30/22 1602 12/04/22 0700       Subjective: Patient seen and examined at bedside today.  Hemodynamically stable.  Very comfortable.  Sitting in the chair.  Denies nausea or vomiting or abdominal pain today.  Tolerating diet  Objective: Vitals:   12/10/22 1437 12/10/22 2346 12/11/22 0318 12/11/22 0618  BP: (!) 111/93 (!) 85/46 104/66 97/73  Pulse: 73 83 80 83  Resp: '17 17 15 17  '$ Temp: 98 F (36.7 C) 98.7 F (37.1 C) 98.3 F (36.8 C) 97.8 F (36.6 C)  TempSrc: Oral Oral Oral Oral  SpO2: (!) 89% 98% 100% 100%  Weight:      Height:        Intake/Output Summary (Last 24 hours) at 12/11/2022 1148 Last data filed at 12/11/2022 0314 Gross per 24 hour  Intake 6455.86 ml  Output 100 ml  Net 6355.86 ml   Filed Weights   12/06/22 0418 12/08/22 0656 12/09/22 0546  Weight: 53 kg 53.7 kg 50.3 kg    Examination:  General exam: Overall comfortable, not in distress,deconditioned HEENT: PERRL Respiratory system:  no wheezes or crackles  Cardiovascular system: S1 & S2 heard, RRR.  Gastrointestinal system: Abdomen is nondistended, soft and nontender.  G-tube Central nervous system: Alert and oriented, left-sided hemiplegia Extremities: No edema, no clubbing ,no cyanosis Skin: No rashes, no ulcers,no icterus     Data Reviewed: I have personally reviewed following labs and imaging studies  CBC: Recent Labs  Lab 12/09/22 0635 12/10/22 0541 12/11/22 0612  WBC 7.9 8.4 8.2  HGB 9.0* 8.5* 8.9*  HCT 27.7* 25.8* 28.0*  MCV 87.1 87.2 89.7  PLT 319 340 588    Basic Metabolic Panel: Recent Labs  Lab 12/05/22 0629 12/06/22 0636  12/08/22 1449 12/09/22 0635 12/10/22 0541 12/11/22 0612  NA 137 139 136 136 132* 133*  K 3.6 4.0 4.1 3.7 3.9 3.4*  CL 105 106 99 96* 100 102  CO2 '25 25 27 26 22 23  '$ GLUCOSE 97 93 109* 110* 87 121*  BUN <5* 7* 9 8 7* 10  CREATININE 0.72 0.61 0.74 0.86 0.83 0.86  CALCIUM 8.1* 8.4* 8.8* 8.1* 8.2* 8.1*  MG 1.6* 1.8  --   --   --   --   PHOS 2.9 3.2  --   --   --   --      No results found for this or any previous visit (from the past 240 hour(s)).    Radiology Studies: No results found.  Scheduled Meds:  amoxicillin-clavulanate  875 mg Per Tube Q12H   diclofenac Sodium  4 g Topical QID   ezetimibe  10 mg Per Tube Daily   free water  100 mL Per Tube Q4H   magnesium oxide  800 mg Per Tube BID   melatonin  3  mg Per Tube QHS   multivitamin  15 mL Per Tube Daily   pantoprazole (PROTONIX) IV  40 mg Intravenous Q12H   polyethylene glycol  17 g Per Tube Daily   senna-docusate  1 tablet Per Tube BID   thiamine  100 mg Per Tube Daily   tiZANidine  2 mg Per Tube BID   Continuous Infusions:  feeding supplement (OSMOLITE 1.5 CAL) 55 mL/hr at 12/11/22 0314     LOS: 10 days   Shelly Coss, MD [facility] P1/30/2024, 11:48 AM

## 2022-12-11 NOTE — Progress Notes (Signed)
Speech Language Pathology Treatment: Dysphagia  Patient Details Name: Kara Bailey MRN: [id number] DOB: 1956/03/22 Today's Date: 12/11/2022 Time: 1105-1130 SLP Time Calculation (min) (ACUTE ONLY): 25 min  Assessment / Plan / Recommendation Clinical Impression  Pt seen for dysphagia treatment - today she reports she would like to try a sandwich - but continues with severe dysarthria and decreased management of secretions due to gross weakness - thus advised her she is not ready for solids - however creamy purees may be helpful to attempt. Pt is sleepy today, frequently falling asleep during the session - needing cues to stay awake.She advised she did not sleep well due to anxiety from all of the activities going on in the hospital. This sleepiness makes her articulation much less clear. Requested pt strengthen her cough for airway protection for potential aspiration clearance- but she has difficulty with motor planning said activity. She also admits to difficulty with IS - stating has "tried". With max cues to speak LOUDLY with open mouth- pt able to clearly articulate "Hi Kara Bailey" - using teach back advised her that this will improve her speech as well as her muscles for swallowing. Pt with clean toothbrush within reach - reiterated importance of mouth care for bacteria load - Pt needs to brush her gums and tongue - Kara Bailey NT in room and heard this exchange. Pt is making good progress with swallowing and dysarthria- Today she is sleepy which has impacted session. Continue full liquids - po trials of creamy purees to commence this week. Pt agreeable to plan.     HPI HPI: Kara Bailey is a 67 y.o. female who presented to [facility] ED on 11/29/22 with worsening dysphagia c/b inability to swallow pills or solid foods. She was sent to ED by PCP for PEG placement (PEG 12/03/22). PMHx includes CVAs, dysarthria, HTN, prior epigastric hernia repair, iliac stent. Pt has a long hx of dysphagia and has worked with SLP in  acute, inpatient, and OP settings over two years. She has had multiple MBS studies. 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  Pt's dysphagia has been primarily oral (difficulty with onset of oral phase and oral control) but pharyngeal function has been largely intact with good bolus clearance and adequate airway protection. Review of 11/30/22 esophagram reveals similar mechanics of swallow. 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      SLP Plan  Continue with current plan of care      Recommendations for follow up therapy are one component of a multi-disciplinary discharge planning process, led by the attending physician.  Recommendations may be updated based on patient status, additional functional criteria and insurance authorization.    Recommendations  Diet recommendations: Thin liquid Liquids provided via: Cup;Straw Medication Administration: Other (Comment) (liquid form if possible, crushed/disolved in liquids) Supervision: Patient able to self feed Compensations: Slow rate;Small sips/bites;Other (Comment) Postural Changes and/or Swallow Maneuvers: Seated upright 90 degrees;Upright 30-60 min after meal                Oral Care Recommendations: Oral care BID Follow Up Recommendations: Skilled nursing-short term rehab (<3 hours/day) SLP Visit Diagnosis: Dysphagia, oral phase (R13.11);Dysphagia, pharyngeal phase (R13.13) Plan: Continue with current plan of care         Kathleen Lime, MS [place] Office [phone]   Macario Golds  12/11/2022, 12:18 PM

## 2022-12-12 DIAGNOSIS — I69391 Dysphagia following cerebral infarction: Secondary | ICD-10-CM | POA: Diagnosis not present

## 2022-12-12 LAB — COMPREHENSIVE METABOLIC PANEL
ALT: 49 U/L — ABNORMAL HIGH (ref 0–44)
AST: 24 U/L (ref 15–41)
Albumin: 2.4 g/dL — ABNORMAL LOW (ref 3.5–5.0)
Alkaline Phosphatase: 121 U/L (ref 38–126)
Anion gap: 6 (ref 5–15)
BUN: 10 mg/dL (ref 8–23)
CO2: 26 mmol/L (ref 22–32)
Calcium: 8.2 mg/dL — ABNORMAL LOW (ref 8.9–10.3)
Chloride: 104 mmol/L (ref 98–111)
Creatinine, Ser: 0.68 mg/dL (ref 0.44–1.00)
GFR, Estimated: >60 mL/min (ref >60)
Glucose, Bld: 88 mg/dL (ref 70–99)
Potassium: 4.4 mmol/L (ref 3.5–5.1)
Sodium: 136 mmol/L (ref 135–145)
Total Bilirubin: 0.5 mg/dL (ref 0.3–1.2)
Total Protein: 5.5 g/dL — ABNORMAL LOW (ref 6.5–8.1)

## 2022-12-12 LAB — GLUCOSE, CAPILLARY
Glucose-Capillary: 105 mg/dL — ABNORMAL HIGH (ref 70–99)
Glucose-Capillary: 133 mg/dL — ABNORMAL HIGH (ref 70–99)

## 2022-12-12 LAB — MAGNESIUM: Magnesium: 2.2 mg/dL (ref 1.7–2.4)

## 2022-12-12 MED ORDER — ATORVASTATIN CALCIUM 40 MG PO TABS
80.0000 mg | ORAL_TABLET | Freq: Every day | ORAL | Status: DC
  Administered 2022-12-12 – 2023-01-13 (×33): 80 mg
  Filled 2022-12-12 (×33): qty 2

## 2022-12-12 MED ORDER — ASPIRIN 81 MG PO CHEW
81.0000 mg | CHEWABLE_TABLET | Freq: Every day | ORAL | Status: DC
  Administered 2022-12-12 – 2022-12-27 (×16): 81 mg
  Filled 2022-12-12 (×16): qty 1

## 2022-12-12 MED ORDER — CLOPIDOGREL BISULFATE 75 MG PO TABS
75.0000 mg | ORAL_TABLET | Freq: Every day | ORAL | Status: DC
  Administered 2022-12-12 – 2022-12-27 (×16): 75 mg
  Filled 2022-12-12 (×16): qty 1

## 2022-12-12 NOTE — Progress Notes (Signed)
Physical Therapy Treatment Patient Details Name: Kara Bailey MRN: [id number] DOB: 1956-08-01 Today's Date: 12/12/2022   History of Present Illness Patient is 67 year old female admitted 11/29/22 with multifactorial dysphagia; s/p PEG tube 12/03/22.  Pt with hx of CVA with L hemiparesis, HTN, iliac stent, hep C, GERD    PT Comments    Pt initially declines therapy, but agreeable with encouragement. Pt comes to sitting EOB with mod A to upright trunk and mobilize LLE to EOB. Pt with good static sitting balance, no LOB in any direction and able to maintain with close supv. Cues for hand placement and BLE engagement to power up to standing, mod A to power to stand with therapist anterior to pt then mod A to pivot to recliner at bedside. Pt able to perform additional STS rep from recliner with mod A to power up and maintains static standing for~4 minutes with min A, cues for extension in stance, therapist blocking L knee but no buckling noted. Pt tolerates seated exercises, active RLE movement and assist to perform exercises on LLE; trace ankle dorsiflexion noted. Pt remains up in recliner at [place], denies increase in pain, RN in room managing tube feed machine.    Recommendations for follow up therapy are one component of a multi-disciplinary discharge planning process, led by the attending physician.  Recommendations may be updated based on patient status, additional functional criteria and insurance authorization.  Follow Up Recommendations  Skilled nursing-short term rehab (<3 hours/day) Can patient physically be transported by private vehicle: No   Assistance Recommended at Discharge Frequent or constant Supervision/Assistance  Patient can return home with the following A little help with bathing/dressing/bathroom;Assistance with cooking/housework;Help with stairs or ramp for entrance;A lot of help with walking and/or transfers   Equipment Recommendations  None recommended by PT     Recommendations for Other Services       Precautions / Restrictions Precautions Precautions: Fall Precaution Comments: L hemi Restrictions Weight Bearing Restrictions: No     Mobility  Bed Mobility Overal bed mobility: Needs Assistance Bed Mobility: Supine to Sit  Supine to sit: Mod assist, HOB elevated  General bed mobility comments: mod A to upright trunk and mobilize LLE to EOB with therapist assisting with bedpad and pt pulling on bedrail with RUE    Transfers Overall transfer level: Needs assistance Equipment used: 1 person hand held assist Transfers: Sit to/from Stand Sit to Stand: Mod assist Stand pivot transfers: Mod assist  General transfer comment: mod A to power to stand with therapist positioned anterior in bear hug position, mod A to pivot over to drop arm recliner at bedside, pt holds LUE into chest, therapist blocking LLE, cues to shift weight anterior    Ambulation/Gait        Stairs             Wheelchair Mobility    Modified Rankin (Stroke Patients Only)       Balance Overall balance assessment: Needs assistance Sitting-balance support: No upper extremity supported Sitting balance-Leahy Scale: Fair Sitting balance - Comments: static sitting, doesn't reach outside BOS   Standing balance support: Single extremity supported Standing balance-Leahy Scale: Poor Standing balance comment: R UE support on therajpist and min A for static stance     Cognition Arousal/Alertness: Awake/alert Behavior During Therapy: WFL for tasks assessed/performed Overall Cognitive Status: Within Functional Limits for tasks assessed  General Comments: pt speaking at low volume, makes needs known, follows 1 step commands well  Exercises General Exercises - Lower Extremity Long Arc Quad: Seated, Both, 10 reps Hip ABduction/ADduction: Seated, Both, 10 reps Hip Flexion/Marching: Seated, Both, 10 reps Toe Raises: Seated, Both, 10 reps    General  Comments        Pertinent Vitals/Pain Pain Assessment Pain Assessment: Faces Faces Pain Scale: Hurts a little bit Pain Location: abdomen/PEG site Pain Descriptors / Indicators: Sore Pain Intervention(s): Limited activity within patient's tolerance, Monitored during session    Home Living                          Prior Function            PT Goals (current goals can now be found in the care plan section) Acute Rehab PT Goals Patient Stated Goal: go to rehab PT Goal Formulation: With patient Time For Goal Achievement: 12/17/22 Potential to Achieve Goals: Good Progress towards PT goals: Progressing toward goals    Frequency    Min 2X/week      PT Plan Current plan remains appropriate    Co-evaluation              AM-PAC PT "6 Clicks" Mobility   Outcome Measure  Help needed turning from your back to your side while in a flat bed without using bedrails?: A Little Help needed moving from lying on your back to sitting on the side of a flat bed without using bedrails?: A Lot Help needed moving to and from a bed to a chair (including a wheelchair)?: A Lot Help needed standing up from a chair using your arms (e.g., wheelchair or bedside chair)?: A Lot Help needed to walk in hospital room?: Total Help needed climbing 3-5 steps with a railing? : Total 6 Click Score: 11    End of Session Equipment Utilized During Treatment: Gait belt Activity Tolerance: Patient tolerated treatment well Patient left: in chair;with call bell/phone within reach;with chair alarm set;with nursing/sitter in room Nurse Communication: Mobility status PT Visit Diagnosis: Other abnormalities of gait and mobility (R26.89);Hemiplegia and hemiparesis;Difficulty in walking, not elsewhere classified (R26.2) Hemiplegia - Right/Left: Left Hemiplegia - dominant/non-dominant: Non-dominant Hemiplegia - caused by: Cerebral infarction     Time: 1132-1205 PT Time Calculation (min) (ACUTE  ONLY): 33 min  Charges:  $Therapeutic Exercise: 8-22 mins $Therapeutic Activity: 8-22 mins                      Tori Alexas Basulto PT, DPT 12/12/22, 12:25 PM

## 2022-12-12 NOTE — Progress Notes (Signed)
Speech Language Pathology Treatment: Cognitive-Linquistic  Patient Details Name: Kara Bailey MRN: [id number] DOB: 1955/12/12 Today's Date: 12/12/2022 Time: 7517-0017 SLP Time Calculation (min) (ACUTE ONLY): 10 min  Assessment / Plan / Recommendation Clinical Impression  Patient seen for skilled SLP to address her language goals - use of AAC device. She reported being wet and uncomfortable - crying stating she needs assist and does not feel like she is listened to. SLP then adapted her AAC program and set 3 phrases "I'm wet, I need changed", "I and nauseated, can I get something to help?"; and "I am in pain, can I get medicine?" to allow more efficient communication with staff. Advised her to use her AAC app to help with more efficient communication. SLP slid pt up in bed - for improved comfort.   Pt verbalized "I want to die". "I'd rther die than live like this." and she reported she wanted her feeding tube out. SLP informed staff of her requests to be changed, receive medicine for nausea and anxiety - to which Ivor, Therapist, sports and Prentiss Bells and [place], NTs arrived to help pt.   SLP messaged pt's dietician regarding pt's concern for tube feeding and nausea.  Will follow up - recommend consider spiritual consult if pt agreeable to help pt deal emotionally with her life changes.     HPI HPI: Kara Bailey is a 67 y.o. female who [facility] ED on 11/29/22 with worsening dysphagia c/b inability to swallow pills or solid foods. She was sent to ED by PCP for PEG placement (PEG 12/03/22). PMHx includes CVAs, dysarthria,  first stroke seen in [facility] is 2022, HTN, prior epigastric hernia repair, iliac stent. Pt has a long hx of dysphagia and has worked with SLP in acute, inpatient, and OP settings over two years. She has had multiple MBS studies. 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  Pt's dysphagia has been primarily oral (difficulty with onset of oral phase and oral control) but pharyngeal function has been largely intact with good bolus clearance and adequate airway protection. Review of 11/30/22 esophagram reveals similar mechanics of swallow. 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  SLP returned to determine if pt would participate in session - RMT and AAC application.  Upon arrival, pt reporting she is wet and uncomfortable.      SLP Plan  Continue with current plan of care      Recommendations for follow up therapy are one component of a multi-disciplinary discharge planning process, led by the attending physician.  Recommendations may be updated based on patient status, additional functional criteria and insurance authorization.    Recommendations                   SLP Visit Diagnosis: Dysarthria and anarthria (R47.1);Aphonia (R49.1) Plan: Continue with current plan of care         Kathleen Lime, MS [place] Office [phone]   Macario Golds  12/12/2022, 4:46 PM

## 2022-12-12 NOTE — Progress Notes (Signed)
Speech Language Pathology Treatment: Dysphagia  Patient Details Name: Kara Bailey MRN: [id number] DOB: [birth year] Today's Date: 12/12/2022 Time: 7253-6644 SLP Time Calculation (min) (ACUTE ONLY): 10 min  Assessment / Plan / Recommendation Clinical Impression  SLP intiiated RMT using Respironics PEP device - - respiratory muscle strength training with pt,clinically set at 5 cm H20 pressure - Pt intially required max verbal/visual cues for appropriate usage - fading to min later in session. Using teach back, pt demonstrated its use. Also provided pt with lollipop to work on Immunologist given her desire for solid food consumption. She will continue to benefit from aggressive dysphagia/dysarthria treatment. Pt reports using er AAC program on her tablet. Will continue treatment.     HPI HPI: Kara Bailey is a 67 y.o. female who presented to [facility] ED on 11/29/22 with worsening dysphagia c/b inability to swallow pills or solid foods. She was sent to ED by PCP for PEG placement (PEG 12/03/22). PMHx includes CVAs, dysarthria,  first stroke seen in [facility] is 2022, HTN, prior epigastric hernia repair, iliac stent. Pt has a long hx of dysphagia and has worked with SLP in acute, inpatient, and OP settings over two years. She has had multiple MBS studies. 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  Pt's dysphagia has been primarily oral (difficulty with onset of oral phase and oral control) but pharyngeal function has been largely intact with good bolus clearance and adequate airway protection. Review of 11/30/22 esophagram reveals similar mechanics of swallow. 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      SLP Plan  Continue with current plan of care      Recommendations for follow up therapy are one  component of a multi-disciplinary discharge planning process, led by the attending physician.  Recommendations may be updated based on patient status, additional functional criteria and insurance authorization.    Recommendations  Liquids provided via: Straw Medication Administration: Other (Comment) (via peg or suspension) Supervision: Patient able to self feed Compensations: Slow rate;Small sips/bites;Other (Comment) Postural Changes and/or Swallow Maneuvers: Seated upright 90 degrees;Upright 30-60 min after meal                Oral Care Recommendations: Oral care BID Follow Up Recommendations: Skilled nursing-short term rehab (<3 hours/day) Assistance recommended at discharge: Intermittent Supervision/Assistance SLP Visit Diagnosis: Dysphagia, oral phase (R13.11) Plan: Continue with current plan of care          Kathleen Lime, MS [place] Office [phone]  Macario Golds  12/12/2022, 11:33 AM

## 2022-12-12 NOTE — Plan of Care (Signed)
Patient [name], VSS throughout shift.  Pt has a little slurred speech and left arm contracted and moves slightly.  Left leg moves a little.  All meds given on time as ordered via G-tube.  G-tube dressing intact. Osmolite 1.5 Tfs at goal, 24m/hr.  Diminished lungs, IS taught and encouraged.  POC maintained, will continue to monitor   Problem: Education: Goal: Knowledge of General Education information will improve Description: Including pain rating scale, medication(s)/side effects and non-pharmacologic comfort measures Outcome: Progressing   Problem: Health Behavior/Discharge Planning: Goal: Ability to manage health-related needs will improve Outcome: Progressing   Problem: Clinical Measurements: Goal: Ability to maintain clinical measurements within normal limits will improve Outcome: Progressing Goal: Will remain free from infection Outcome: Progressing Goal: Diagnostic test results will improve Outcome: Progressing Goal: Respiratory complications will improve Outcome: Progressing Goal: Cardiovascular complication will be avoided Outcome: Progressing   Problem: Activity: Goal: Risk for activity intolerance will decrease Outcome: Progressing   Problem: Nutrition: Goal: Adequate nutrition will be maintained Outcome: Progressing   Problem: Coping: Goal: Level of anxiety will decrease Outcome: Progressing   Problem: Elimination: Goal: Will not experience complications related to bowel motility Outcome: Progressing Goal: Will not experience complications related to urinary retention Outcome: Progressing   Problem: Pain Managment: Goal: General experience of comfort will improve Outcome: Progressing   Problem: Safety: Goal: Ability to remain free from injury will improve Outcome: Progressing   Problem: Skin Integrity: Goal: Risk for impaired skin integrity will decrease Outcome: Progressing

## 2022-12-12 NOTE — Progress Notes (Signed)
PROGRESS NOTE  Kara Bailey  OZD:[id number] DOB: Feb 12, 1956 DOA: 11/29/2022 PCP: Andree Moro, DO   Brief Narrative: Patient is a 67 year old female with history of CVA, hypertension presented the emergency department at the request of her PCP for the evaluation of persistent dysphagia.  Found to have multifactorial dysphagia likely from stroke.  Status post G-tube placement by IR.  PT/OT recommending SNF on discharge.  Developed nausea, vomiting, abdominal pain on 1/26,  CT abdomen/pelvis showed possible cholecystitis, MRCP confirmed acute cholecystitis.  General surgery was  following.  Significant improvement abdominal pain, nausea, vomiting, liver enzymes.  Plan is to continue antibiotics without any operative intervention.  Diet started and she is tolerating.  Medically stable for discharge to SNF whenever possible,auth is still pending  Assessment & Plan:  Principal Problem:   Dysphagia as late effect of cerebrovascular accident (CVA) Active Problems:   Essential hypertension   Left hemiplegia (HCC)   History of stroke   Dysphagia   Protein-calorie malnutrition, severe  Dysphagia: Likely secondary to residual effect of the ischemic stroke.  Has history of esophageal stricture last dilated on 2021.  GI consulted and was following.  Barium swallow done on 11/30/2022 was nondiagnostic.  Underwent G-tube placement by IR on 1/22.  Underwent MBS again  speech therapy to check if she can swallow orally.Started on full liquid diet. Continue tube feeding diet, full liquid diet.   Abdomen pain, nausea, vomiting/acute cholecystitis: Likely acalculous cholecystitis.  CT abdomen/pelvis, MRCP confirmed acute cholecystitis without stones.  Patient was complaining of   periumbilical abdominal pain but now it has resolved.  Abdomen has remained  benign on examination: Soft, nondistended, nontender with good bowel sounds. Denied any nausea or vomiting today.  Liver enzymes were elevated, now almost  normalized.  General surgery following signed off.  Abx changed to Augmentin .  Severe protein calorie malnutrition: Nutrition was following.  BMI of 18.  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  On Plavix, aspirin, Lipitor, Zetia.  Also on baclofen and Zanaflex for left-sided hemiplegia and spasticity  Hypertension: Currently blood pressure soft/stable.  Not on any medications  Hypomagnesemia/hypokalemia: Continue magnesium supplementation.  Depression: On celexa  Normocytic anemia: No evidence of acute blood loss.  Currently hemoglobin stable in the range of 8-9  Debility/deconditioning: PT/OT recommending SNF on discharge.TOC following      Nutrition Problem: Severe Malnutrition Etiology: chronic illness, dysphagia (h/o CVA)    DVT prophylaxis:SCDs Start: 11/30/22 0151     Code Status: Full Code  Family Communication: Called daughter [name] on phone on 1/29  Patient status:Inpatient  Patient is from :Home  Anticipated discharge to:SnF  Estimated DC date: as soon as bed is available,auth pending   Consultants: GI,IR,surgery  Procedures:G tube placement   Antimicrobials:  Anti-infectives (From admission, onward)    Start     Dose/Rate Route Frequency Ordered Stop   12/11/22 2200  amoxicillin-clavulanate (AUGMENTIN) 400-57 MG/5ML suspension 875 mg        875 mg Per Tube Every 12 hours 12/11/22 1137 12/21/22 2159   12/11/22 1000  amoxicillin-clavulanate (AUGMENTIN) 875-125 MG per tablet 1 tablet  Status:  Discontinued        1 tablet Oral Every 12 hours 12/11/22 0821 12/11/22 1137   12/08/22 2000  piperacillin-tazobactam (ZOSYN) IVPB 3.375 g  Status:  Discontinued        3.375 g 12.5 mL/hr over 240 Minutes Intravenous Every 8 hours 12/08/22 1900 12/11/22 0821   12/07/22  1130  cefadroxil (DURICEF) capsule 500 mg  Status:  Discontinued        500 mg Oral 2 times daily 12/07/22 1033 12/07/22 1033    12/07/22 1130  cefadroxil (DURICEF) capsule 500 mg  Status:  Discontinued        500 mg Oral 2 times daily 12/07/22 1033 12/07/22 1034   12/07/22 1130  cefadroxil (DURICEF) capsule 500 mg  Status:  Discontinued        500 mg Per Tube 2 times daily 12/07/22 1034 12/08/22 1903   12/03/22 0700  ceFAZolin (ANCEF) IVPB 2g/100 mL premix        2 g 200 mL/hr over 30 Minutes Intravenous To Radiology 11/30/22 1602 12/04/22 0700       Subjective: Patient seen and examined at bedside today.  Comfortable lying on the bed.  Denies any nausea, vomiting or abdominal pain.  Tolerating diet.  No new complaints today  Objective: Vitals:   12/11/22 0618 12/11/22 1445 12/11/22 2231 12/12/22 0635  BP: 97/73 119/66 (!) 87/48 (!) 110/55  Pulse: 83 83 79 84  Resp: '17 15 17 16  '$ Temp: 97.8 F (36.6 C) 97.7 F (36.5 C) 98.2 F (36.8 C) 97.9 F (36.6 C)  TempSrc: Oral Oral Oral Oral  SpO2: 100% 97% 98% 100%  Weight:      Height:        Intake/Output Summary (Last 24 hours) at 12/12/2022 1258 Last data filed at 12/12/2022 0600 Gross per 24 hour  Intake 600 ml  Output 600 ml  Net 0 ml   Filed Weights   12/06/22 0418 12/08/22 0656 12/09/22 0546  Weight: 53 kg 53.7 kg 50.3 kg    Examination:   General exam: Overall comfortable, not in distress,deconditioned HEENT: PERRL Respiratory system:  no wheezes or crackles  Cardiovascular system: S1 & S2 heard, RRR.  Gastrointestinal system: Abdomen is nondistended, soft and nontender.G tube Central nervous system: Alert and oriented, left hemiplegia Extremities: No edema, no clubbing ,no cyanosis Skin: No rashes, no ulcers,no icterus     Data Reviewed: I have personally reviewed following labs and imaging studies  CBC: Recent Labs  Lab 12/09/22 0635 12/10/22 0541 12/11/22 0612  WBC 7.9 8.4 8.2  HGB 9.0* 8.5* 8.9*  HCT 27.7* 25.8* 28.0*  MCV 87.1 87.2 89.7  PLT 319 340 371    Basic Metabolic Panel: Recent Labs  Lab 12/06/22 0636  12/08/22 1449 12/09/22 0635 12/10/22 0541 12/11/22 0612 12/12/22 0702  NA 139 136 136 132* 133* 136  K 4.0 4.1 3.7 3.9 3.4* 4.4  CL 106 99 96* 100 102 104  CO2 '25 27 26 22 23 26  '$ GLUCOSE 93 109* 110* 87 121* 88  BUN 7* 9 8 7* 10 10  CREATININE 0.61 0.74 0.86 0.83 0.86 0.68  CALCIUM 8.4* 8.8* 8.1* 8.2* 8.1* 8.2*  MG 1.8  --   --   --   --  2.2  PHOS 3.2  --   --   --   --   --      No results found for this or any previous visit (from the past 240 hour(s)).    Radiology Studies: No results found.  Scheduled Meds:  amoxicillin-clavulanate  875 mg Per Tube Q12H   diclofenac Sodium  4 g Topical QID   ezetimibe  10 mg Per Tube Daily   free water  100 mL Per Tube Q4H   magnesium oxide  800 mg Per Tube BID   melatonin  3 mg Per Tube QHS   multivitamin  15 mL Per Tube Daily   pantoprazole (PROTONIX) IV  40 mg Intravenous Q12H   polyethylene glycol  17 g Per Tube Daily   senna-docusate  1 tablet Per Tube BID   thiamine  100 mg Per Tube Daily   tiZANidine  2 mg Per Tube BID   Continuous Infusions:  feeding supplement (OSMOLITE 1.5 CAL) 55 mL/hr at 12/11/22 2000     LOS: 11 days   Shelly Coss, MD [facility] P1/31/2024, 12:58 PM

## 2022-12-13 DIAGNOSIS — I69391 Dysphagia following cerebral infarction: Secondary | ICD-10-CM | POA: Diagnosis not present

## 2022-12-13 LAB — CBC
HCT: 29.2 % — ABNORMAL LOW (ref 36.0–46.0)
Hemoglobin: 9.1 g/dL — ABNORMAL LOW (ref 12.0–15.0)
MCH: 28.6 pg (ref 26.0–34.0)
MCHC: 31.2 g/dL (ref 30.0–36.0)
MCV: 91.8 fL (ref 80.0–100.0)
Platelets: 374 10*3/uL (ref 150–400)
RBC: 3.18 MIL/uL — ABNORMAL LOW (ref 3.87–5.11)
RDW: 18.1 % — ABNORMAL HIGH (ref 11.5–15.5)
WBC: 9.1 10*3/uL (ref 4.0–10.5)
nRBC: 0 % (ref 0.0–0.2)

## 2022-12-13 LAB — GLUCOSE, CAPILLARY: Glucose-Capillary: 83 mg/dL (ref 70–99)

## 2022-12-13 MED ORDER — PROSOURCE PLUS PO LIQD
30.0000 mL | Freq: Three times a day (TID) | ORAL | Status: DC
  Administered 2022-12-13 – 2022-12-21 (×19): 30 mL via ORAL
  Filled 2022-12-13 (×20): qty 30

## 2022-12-13 MED ORDER — ENSURE ENLIVE PO LIQD
237.0000 mL | Freq: Three times a day (TID) | ORAL | Status: DC
  Administered 2022-12-13 – 2022-12-14 (×2): 237 mL via ORAL

## 2022-12-13 NOTE — TOC Progression Note (Signed)
Transition of Care [facility]) - Progression Note    Patient Details  Name: Kara Bailey MRN: [id number] Date of Birth: 23-Oct-1956  Transition of Care [facility]) CM/SW [place], RN Phone Number:[phone]  12/13/2022, 11:16 AM  Clinical Narrative:    Cm received message from [place] with admissions at [facility] stating that patients insurance auth for SNF has been denied. Per paula she is waiting on letter of denial before appeal can be filed. [name] will follow up when denial letter is received         Expected Discharge Plan and Services         Expected Discharge Date: 12/07/22                                     Social Determinants of Health ([place]) Interventions SDOH Screenings   Food Insecurity: Unknown (12/04/2022)  Housing: Low Risk  (12/04/2022)  Transportation Needs: Unknown (12/04/2022)  Utilities: Unknown (12/04/2022)  Depression (PHQ2-9): High Risk (09/19/2022)  Tobacco Use: High Risk (12/03/2022)    Readmission Risk Interventions     No data to display

## 2022-12-13 NOTE — Progress Notes (Signed)
PROGRESS NOTE  Kara Bailey  YFV:[id number] DOB: 1955/11/16 DOA: 11/29/2022 PCP: Andree Moro, DO   Brief Narrative: Patient is a 67 year old female with history of CVA, hypertension presented the emergency department at the request of her PCP for the evaluation of persistent dysphagia.  Found to have multifactorial dysphagia likely from stroke.  Status post G-tube placement by IR.  PT/OT recommending SNF on discharge.  Developed nausea, vomiting, abdominal pain on 1/26,  CT abdomen/pelvis showed possible cholecystitis, MRCP confirmed acute cholecystitis.  General surgery was  following.  Significant improvement abdominal pain, nausea, vomiting, liver enzymes.  Plan is to continue antibiotics without any operative intervention.  Diet started and she is tolerating.  Medically stable for discharge to SNF whenever possible.TOC following  Assessment & Plan:  Principal Problem:   Dysphagia as late effect of cerebrovascular accident (CVA) Active Problems:   Essential hypertension   Left hemiplegia (HCC)   History of stroke   Dysphagia   Protein-calorie malnutrition, severe  Dysphagia: Likely secondary to residual effect of the ischemic stroke.  Has history of esophageal stricture last dilated on 2021.  GI consulted and was following.  Barium swallow done on 11/30/2022 was nondiagnostic.  Underwent G-tube placement by IR on 1/22.  Underwent MBS again  speech therapy to check if she can swallow orally.Started on full liquid diet. Continue tube feeding diet, full liquid diet.   Abdomen pain, nausea, vomiting/acute cholecystitis: Likely acalculous cholecystitis.  CT abdomen/pelvis, MRCP confirmed acute cholecystitis without stones.  Patient was complaining of   periumbilical abdominal pain but now it has resolved.  Abdomen has remained  benign on examination: Soft, nondistended, nontender with good bowel sounds. Denied any nausea or vomiting today.  Liver enzymes were elevated, now almost normalized.   General surgery following signed off.  Abx changed to Augmentin .  Severe protein calorie malnutrition: Nutrition was following.  BMI of 18.  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  On Plavix, aspirin, Lipitor, Zetia.  Also on baclofen and Zanaflex for left-sided hemiplegia and spasticity  Hypertension: Currently blood pressure soft/stable.  Not on any medications  Hypomagnesemia/hypokalemia: Continue magnesium supplementation.  Depression: On celexa  Normocytic anemia: No evidence of acute blood loss.  Currently hemoglobin stable in the range of 8-9  Debility/deconditioning: PT/OT recommending SNF on discharge.TOC following      Nutrition Problem: Severe Malnutrition Etiology: chronic illness, dysphagia (h/o CVA)    DVT prophylaxis:SCDs Start: 11/30/22 0151     Code Status: Full Code  Family Communication: Called daughter [name] on phone on 1/29  Patient status:Inpatient  Patient is from :Home  Anticipated discharge to:SnF  Estimated DC date: as soon as bed is available,auth pending   Consultants: GI,IR,surgery  Procedures:G tube placement   Antimicrobials:  Anti-infectives (From admission, onward)    Start     Dose/Rate Route Frequency Ordered Stop   12/11/22 2200  amoxicillin-clavulanate (AUGMENTIN) 400-57 MG/5ML suspension 875 mg        875 mg Per Tube Every 12 hours 12/11/22 1137 12/18/22 2159   12/11/22 1000  amoxicillin-clavulanate (AUGMENTIN) 875-125 MG per tablet 1 tablet  Status:  Discontinued        1 tablet Oral Every 12 hours 12/11/22 0821 12/11/22 1137   12/08/22 2000  piperacillin-tazobactam (ZOSYN) IVPB 3.375 g  Status:  Discontinued        3.375 g 12.5 mL/hr over 240 Minutes Intravenous Every 8 hours 12/08/22 1900 12/11/22 0821   12/07/22 1130  cefadroxil (DURICEF) capsule 500 mg  Status:  Discontinued        500 mg Oral 2 times daily 12/07/22 1033 12/07/22 1033   12/07/22 1130   cefadroxil (DURICEF) capsule 500 mg  Status:  Discontinued        500 mg Oral 2 times daily 12/07/22 1033 12/07/22 1034   12/07/22 1130  cefadroxil (DURICEF) capsule 500 mg  Status:  Discontinued        500 mg Per Tube 2 times daily 12/07/22 1034 12/08/22 1903   12/03/22 0700  ceFAZolin (ANCEF) IVPB 2g/100 mL premix        2 g 200 mL/hr over 30 Minutes Intravenous To Radiology 11/30/22 1602 12/04/22 0700       Subjective: Patient seen and examined at bedside today.  Hemodynamically stable.  Overall comfortable.  Denies any complaints.  Has been having few episodes of diarrhea which is most likely from Augmentin.  She is also on tube feeding which can also cause diarrhea  Objective: Vitals:   12/11/22 2231 12/12/22 0635 12/12/22 1357 12/12/22 1937  BP: (!) 87/48 (!) 110/55 96/67 137/66  Pulse: 79 84 81 91  Resp: '17 16 18 18  '$ Temp: 98.2 F (36.8 C) 97.9 F (36.6 C) 98.2 F (36.8 C) 98.4 F (36.9 C)  TempSrc: Oral Oral Oral Oral  SpO2: 98% 100% 100% 100%  Weight:      Height:        Intake/Output Summary (Last 24 hours) at 12/13/2022 1125 Last data filed at 12/13/2022 0948 Gross per 24 hour  Intake 1045.92 ml  Output 600 ml  Net 445.92 ml   Filed Weights   12/06/22 0418 12/08/22 0656 12/09/22 0546  Weight: 53 kg 53.7 kg 50.3 kg    Examination:   General exam: Overall comfortable, not in distress, deconditioned, weak, pleasant female HEENT: PERRL Respiratory system:  no wheezes or crackles  Cardiovascular system: S1 & S2 heard, RRR.  Gastrointestinal system: Abdomen is nondistended, soft and nontender.  G-tube Central nervous system: Alert and oriented, left hemiplegia Extremities: No edema, no clubbing ,no cyanosis Skin: No rashes, no ulcers,no icterus      Data Reviewed: I have personally reviewed following labs and imaging studies  CBC: Recent Labs  Lab 12/09/22 0635 12/10/22 0541 12/11/22 0612 12/13/22 0557  WBC 7.9 8.4 8.2 9.1  HGB 9.0* 8.5* 8.9* 9.1*   HCT 27.7* 25.8* 28.0* 29.2*  MCV 87.1 87.2 89.7 91.8  PLT 319 340 375 720    Basic Metabolic Panel: Recent Labs  Lab 12/08/22 1449 12/09/22 0635 12/10/22 0541 12/11/22 0612 12/12/22 0702  NA 136 136 132* 133* 136  K 4.1 3.7 3.9 3.4* 4.4  CL 99 96* 100 102 104  CO2 '27 26 22 23 26  '$ GLUCOSE 109* 110* 87 121* 88  BUN 9 8 7* 10 10  CREATININE 0.74 0.86 0.83 0.86 0.68  CALCIUM 8.8* 8.1* 8.2* 8.1* 8.2*  MG  --   --   --   --  2.2     No results found for this or any previous visit (from the past 240 hour(s)).    Radiology Studies: No results found.  Scheduled Meds:  (feeding supplement) PROSource Plus  30 mL Oral TID BM   amoxicillin-clavulanate  875 mg Per Tube Q12H   aspirin  81 mg Per Tube Daily   atorvastatin  80 mg Per Tube QPC supper   clopidogrel  75 mg Per Tube Daily   diclofenac Sodium  4 g Topical QID   ezetimibe  10 mg Per Tube Daily   feeding supplement  237 mL Oral TID BM   free water  100 mL Per Tube Q4H   magnesium oxide  800 mg Per Tube BID   melatonin  3 mg Per Tube QHS   multivitamin  15 mL Per Tube Daily   pantoprazole (PROTONIX) IV  40 mg Intravenous Q12H   polyethylene glycol  17 g Per Tube Daily   senna-docusate  1 tablet Per Tube BID   thiamine  100 mg Per Tube Daily   tiZANidine  2 mg Per Tube BID   Continuous Infusions:  feeding supplement (OSMOLITE 1.5 CAL) Stopped (12/12/22 1900)     LOS: 12 days   Shelly Coss, MD [facility] P2/11/2022, 11:25 AM

## 2022-12-13 NOTE — Plan of Care (Signed)
Patient [name], VSS throughout shift.  Pt has a little slurred speech and left arm contracted and moves slightly.  Left leg moves a little.  All meds given on time as ordered via G-tube.  G-tube dressing intact. Osmolite 1.5 Tfs refused by patient all night.  Provider notified.  Pt encouraged to drink some water and tolerated it well.  Diminished lungs, IS taught and encouraged.  One BM overnight.  Purewick in place.  POC maintained, will continue to monitor.  Problem: Education: Goal: Knowledge of General Education information will improve Description: Including pain rating scale, medication(s)/side effects and non-pharmacologic comfort measures Outcome: Progressing   Problem: Health Behavior/Discharge Planning: Goal: Ability to manage health-related needs will improve Outcome: Progressing   Problem: Clinical Measurements: Goal: Ability to maintain clinical measurements within normal limits will improve Outcome: Progressing Goal: Will remain free from infection Outcome: Progressing Goal: Diagnostic test results will improve Outcome: Progressing Goal: Respiratory complications will improve Outcome: Progressing Goal: Cardiovascular complication will be avoided Outcome: Progressing   Problem: Activity: Goal: Risk for activity intolerance will decrease Outcome: Progressing   Problem: Nutrition: Goal: Adequate nutrition will be maintained Outcome: Progressing   Problem: Coping: Goal: Level of anxiety will decrease Outcome: Progressing   Problem: Elimination: Goal: Will not experience complications related to bowel motility Outcome: Progressing Goal: Will not experience complications related to urinary retention Outcome: Progressing   Problem: Pain Managment: Goal: General experience of comfort will improve Outcome: Progressing   Problem: Safety: Goal: Ability to remain free from injury will improve Outcome: Progressing   Problem: Skin Integrity: Goal: Risk for impaired  skin integrity will decrease Outcome: Progressing

## 2022-12-13 NOTE — Progress Notes (Signed)
Nutrition Follow-up  DOCUMENTATION CODES:   Severe malnutrition in context of chronic illness  INTERVENTION:   -48 hour Calorie Count initiated.  -Ensure Plus High Protein po TID, each supplement provides 350 kcal and 20 grams of protein.   -Magic cup TID with meals, each supplement provides 290 kcal and 9 grams of protein   -Prosource Plus PO TID, each provides 100 kcals and 15g protein  -Hold tube feeding for now  -Multivitamin with minerals daily via tube - 100 mg Thiamine daily   NUTRITION DIAGNOSIS:   Severe Malnutrition related to chronic illness, dysphagia (h/o CVA) as evidenced by moderate fat depletion, severe muscle depletion, energy intake < or equal to 75% for > or equal to 1 month.  Ongoing.  GOAL:   Patient will meet greater than or equal to 90% of their needs  Not meeting, working on PO  MONITOR:   PO intake, Supplement acceptance, Labs, Weight trends, I & O's  ASSESSMENT:   67 year old African-American female history of multiple strokes, dense left hemiparesis, hypertension who presents to the ER today with worsening dysphagia.  1/22: PEG placed by IR   RD received message from SLP regarding pt now refusing tube feeds d/t nausea.  Spoke with patient at bedside, states she feels better since tube feeding has been off. States that she was having a lot of BMs and was feeling nauseous.  Pt ate some grits and hot chocolate this morning (~330 kcals, 2g protein).   Pt really wants to focus on improving her oral intake. She was agreeable to starting a calorie count to see if she can get close to meeting her kcal and protein needs. She is agreeable to drinking supplements again. Will order Ensure, Prosource and Magic cups to better meet needs on full liquid diet.   Admission weight: 112 lbs. Current weight: 110 lbs  Medications: MAG-OX, MVI, Miralax, Senokot, Thiamine  Labs reviewed: CBGs: 83-133   Diet Order:   Diet Order             Diet full  liquid Room service appropriate? Yes; Fluid consistency: Thin  Diet effective now           Diet general                   EDUCATION NEEDS:   Education needs have been addressed  Skin:  Skin Assessment: Reviewed RN Assessment  Last BM:  2/1 -type 6  Height:   Ht Readings from Last 1 Encounters:  11/30/22 '5\' 6"'$  (1.676 m)    Weight:   Wt Readings from Last 1 Encounters:  12/09/22 50.3 kg    BMI:  Body mass index is 17.9 kg/m.  Estimated Nutritional Needs:   Kcal:  9147-8295  Protein:  80-95g  Fluid:  1.8L/day  Clayton Bibles, MS, RD, LDN Inpatient Clinical Dietitian Contact information available via Amion

## 2022-12-13 NOTE — Progress Notes (Signed)
Chaplain engaged in an initial visit with Dominica.  Marchella shared right away about struggling with anxiety and depression.  She touched her head to signify that she was struggling in her mind.  Chaplain asked Raeanne if she had ever been given anything for anxiety or depression, and she noted that she had taken something for anxiety at some point in her life.  Chaplain also worked to understand the stressors or social economic factors around [place].    Liyat became tearful as she talked about the relationship with her son.  That has been a strained relationship of little to no communication.  She desires to hear from him and see him.  She also noted that her children's father is in [place] as well and that the son has possibly gone to see him but not her.  Chaplain affirmed how hard that reality has been for her. But Chaplain also worked to affirm what does exist for [employer].  She does have great relationships with her daughters and grandchildren.  Chaplain sat with her as she showed her pictures and videos. She lives with her oldest daughter but notes that it often feels to loud and busy with her all her grandchildren there.  She had some angst around where she would go after rehab.  Ottilie later was adamant about staying with her daughter.    Alissandra used to live with her boyfriend but he could no longer take care of her.  She desires to be in a home with him eventually, as her daughter looks for a bigger house.  Chaplain assessed that her living situation after going to rehab has been weighing on her, but Chaplain also assesses that Shetara gets a great deal of joy and strength from being with her family.   Chaplain offered prayer over Dominica.  Chaplain worked to provide a compassionate presence, normalize her emotions, and provide reflective listening.    Chaplain Tyrena Gohr, MDiv  12/13/22 1300  Spiritual Encounters  Type of Visit Initial  Care provided to: Patient  Referral source Nurse  (RN/NT/LPN)  Spiritual Framework  Presenting Themes Goals in life/care;Caregiving needs;Impactful experiences and emotions;Rituals and practive  Community/Connection Family  Patient Stress Factors Family relationships  Interventions  Spiritual Care Interventions Made Compassionate presence;Established relationship of care and support;Normalization of emotions;Decision-making support/facilitation;Prayer  Intervention Outcomes  Outcomes Connection to spiritual care;Awareness around self/spiritual resourses;Reduced anxiety;Reduced fear

## 2022-12-14 DIAGNOSIS — I69391 Dysphagia following cerebral infarction: Secondary | ICD-10-CM | POA: Diagnosis not present

## 2022-12-14 LAB — GLUCOSE, CAPILLARY
Glucose-Capillary: 94 mg/dL (ref 70–99)
Glucose-Capillary: 104 mg/dL — ABNORMAL HIGH (ref 70–99)
Glucose-Capillary: 97 mg/dL (ref 70–99)

## 2022-12-14 MED ORDER — LIP MEDEX EX OINT
TOPICAL_OINTMENT | CUTANEOUS | Status: DC | PRN
  Administered 2022-12-14: 75 via TOPICAL
  Filled 2022-12-14 (×2): qty 7

## 2022-12-14 MED ORDER — BOOST / RESOURCE BREEZE PO LIQD CUSTOM
1.0000 | Freq: Three times a day (TID) | ORAL | Status: DC
  Administered 2022-12-14 – 2022-12-15 (×3): 1 via ORAL

## 2022-12-14 NOTE — Progress Notes (Addendum)
SLP Cancellation Note  Patient Details Name: Kara Bailey MRN: [id number] DOB: May 22, 1956   Cancelled treatment:       Reason Eval/Treat Not Completed: Other (comment) (pt getting ready to have pt care provided pt staff, will follow up)  Kathleen Lime, MS [place] Office [phone]  Macario Golds 12/14/2022, 6:51 PM

## 2022-12-14 NOTE — Progress Notes (Signed)
PROGRESS NOTE  Kara Bailey  KCL:[id number] DOB: Nov 11, 1956 DOA: 11/29/2022 PCP: Andree Moro, DO   Brief Narrative: Patient is a 67 year old female with history of CVA, hypertension presented the emergency department at the request of her PCP for the evaluation of persistent dysphagia.  Found to have multifactorial dysphagia likely from stroke.  Status post G-tube placement by IR.  PT/OT recommending SNF on discharge.  Developed nausea, vomiting, abdominal pain on 1/26,  CT abdomen/pelvis showed possible cholecystitis, MRCP confirmed acute cholecystitis.  General surgery was  following.  Significant improvement abdominal pain, nausea, vomiting, liver enzymes.  Plan is to continue antibiotics without any operative intervention.  Diet started and she is tolerating.  Medically stable for discharge to SNF whenever possible.TOC following  Assessment & Plan:  Principal Problem:   Dysphagia as late effect of cerebrovascular accident (CVA) Active Problems:   Essential hypertension   Left hemiplegia (HCC)   History of stroke   Dysphagia   Protein-calorie malnutrition, severe  Dysphagia: Likely secondary to residual effect of the ischemic stroke.  Has history of esophageal stricture last dilated on 2021.  GI consulted and was following.  Barium swallow done on 11/30/2022 was nondiagnostic.  Underwent G-tube placement by IR on 1/22.  Underwent MBS again  speech therapy to check if she can swallow orally.Started on full liquid diet. Continue tube feeding diet, full liquid diet.   Abdomen pain, nausea, vomiting/acute cholecystitis: Likely acalculous cholecystitis.  CT abdomen/pelvis, MRCP confirmed acute cholecystitis without stones.  Patient was complaining of   periumbilical abdominal pain but now it has resolved.  Abdomen remains soft, nondistended, nontender with good bowel sounds.   Liver enzymes were elevated, now almost normalized.  General surgery following signed off.  Abx changed to Augmentin  . She is having diarrhea most likely from antibiotic and also tube feeding.  Continue Imodium as needed  Severe protein calorie malnutrition: Nutrition was following.  BMI of 18.  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  On Plavix, aspirin, Lipitor, Zetia.  Also on baclofen and Zanaflex for left-sided hemiplegia and spasticity  Hypertension: Currently blood pressure soft/stable.  Not on any medications  Hypomagnesemia/hypokalemia: Continue magnesium supplementation.  Depression: On celexa  Normocytic anemia: No evidence of acute blood loss.  Currently hemoglobin stable in the range of 8-9  Debility/deconditioning: PT/OT recommending SNF on discharge.TOC following      Nutrition Problem: Severe Malnutrition Etiology: chronic illness, dysphagia (h/o CVA)    DVT prophylaxis:SCDs Start: 11/30/22 0151     Code Status: Full Code  Family Communication: Called daughter [name] on phone on 1/29  Patient status:Inpatient  Patient is from :Home  Anticipated discharge to:SnF  Estimated DC date: as soon as bed is available,auth pending/denied by insurance but appeal submitted   Consultants: GI,IR,surgery  Procedures:G tube placement   Antimicrobials:  Anti-infectives (From admission, onward)    Start     Dose/Rate Route Frequency Ordered Stop   12/11/22 2200  amoxicillin-clavulanate (AUGMENTIN) 400-57 MG/5ML suspension 875 mg        875 mg Per Tube Every 12 hours 12/11/22 1137 12/18/22 2159   12/11/22 1000  amoxicillin-clavulanate (AUGMENTIN) 875-125 MG per tablet 1 tablet  Status:  Discontinued        1 tablet Oral Every 12 hours 12/11/22 0821 12/11/22 1137   12/08/22 2000  piperacillin-tazobactam (ZOSYN) IVPB 3.375 g  Status:  Discontinued        3.375 g 12.5 mL/hr over 240 Minutes Intravenous  Every 8 hours 12/08/22 1900 12/11/22 0821   12/07/22 1130  cefadroxil (DURICEF) capsule 500 mg  Status:  Discontinued         500 mg Oral 2 times daily 12/07/22 1033 12/07/22 1033   12/07/22 1130  cefadroxil (DURICEF) capsule 500 mg  Status:  Discontinued        500 mg Oral 2 times daily 12/07/22 1033 12/07/22 1034   12/07/22 1130  cefadroxil (DURICEF) capsule 500 mg  Status:  Discontinued        500 mg Per Tube 2 times daily 12/07/22 1034 12/08/22 1903   12/03/22 0700  ceFAZolin (ANCEF) IVPB 2g/100 mL premix        2 g 200 mL/hr over 30 Minutes Intravenous To Radiology 11/30/22 1602 12/04/22 0700       Subjective: Patient seen and examined at bedside this morning.  Hemodynamically stable sitting on the chair.  She appears overall comfortable but denies any abdomen pain, nausea or vomiting but having some diarrhea.   Objective: Vitals:   12/12/22 1937 12/13/22 1342 12/13/22 2049 12/14/22 0427  BP: 137/66 119/63 135/69 135/71  Pulse: 91 71 88 81  Resp: '18 20 18 18  '$ Temp: 98.4 F (36.9 C) 97.9 F (36.6 C) 98 F (36.7 C) 97.6 F (36.4 C)  TempSrc: Oral Oral Oral Oral  SpO2: 100% 100% 100% 100%  Weight:      Height:        Intake/Output Summary (Last 24 hours) at 12/14/2022 1145 Last data filed at 12/14/2022 0950 Gross per 24 hour  Intake 217 ml  Output 400 ml  Net -183 ml   Filed Weights   12/06/22 0418 12/08/22 0656 12/09/22 0546  Weight: 53 kg 53.7 kg 50.3 kg    Examination:  General exam: Overall comfortable, not in distress, deconditioned, weak HEENT: PERRL Respiratory system:  no wheezes or crackles  Cardiovascular system: S1 & S2 heard, RRR.  Gastrointestinal system: Abdomen is nondistended, soft and nontender.  G-tube Central nervous system: Alert and oriented, left hemiplegia Extremities: No edema, no clubbing ,no cyanosis Skin: No rashes, no ulcers,no icterus        Data Reviewed: I have personally reviewed following labs and imaging studies  CBC: Recent Labs  Lab 12/09/22 0635 12/10/22 0541 12/11/22 0612 12/13/22 0557  WBC 7.9 8.4 8.2 9.1  HGB 9.0* 8.5* 8.9* 9.1*   HCT 27.7* 25.8* 28.0* 29.2*  MCV 87.1 87.2 89.7 91.8  PLT 319 340 375 528    Basic Metabolic Panel: Recent Labs  Lab 12/08/22 1449 12/09/22 0635 12/10/22 0541 12/11/22 0612 12/12/22 0702  NA 136 136 132* 133* 136  K 4.1 3.7 3.9 3.4* 4.4  CL 99 96* 100 102 104  CO2 '27 26 22 23 26  '$ GLUCOSE 109* 110* 87 121* 88  BUN 9 8 7* 10 10  CREATININE 0.74 0.86 0.83 0.86 0.68  CALCIUM 8.8* 8.1* 8.2* 8.1* 8.2*  MG  --   --   --   --  2.2     No results found for this or any previous visit (from the past 240 hour(s)).    Radiology Studies: No results found.  Scheduled Meds:  (feeding supplement) PROSource Plus  30 mL Oral TID BM   amoxicillin-clavulanate  875 mg Per Tube Q12H   aspirin  81 mg Per Tube Daily   atorvastatin  80 mg Per Tube QPC supper   clopidogrel  75 mg Per Tube Daily   diclofenac Sodium  4  g Topical QID   ezetimibe  10 mg Per Tube Daily   feeding supplement  1 Container Oral TID BM   feeding supplement  237 mL Oral TID BM   free water  100 mL Per Tube Q4H   magnesium oxide  800 mg Per Tube BID   melatonin  3 mg Per Tube QHS   multivitamin  15 mL Per Tube Daily   pantoprazole (PROTONIX) IV  40 mg Intravenous Q12H   polyethylene glycol  17 g Per Tube Daily   senna-docusate  1 tablet Per Tube BID   thiamine  100 mg Per Tube Daily   tiZANidine  2 mg Per Tube BID   Continuous Infusions:  feeding supplement (OSMOLITE 1.5 CAL) Stopped (12/12/22 1900)     LOS: 13 days   Shelly Coss, MD [facility] P2/12/2022, 11:45 AM

## 2022-12-14 NOTE — Progress Notes (Signed)
Occupational Therapy Treatment Patient Details Name: Kara Bailey MRN: [id number] DOB: 05/10/1956 Today's Date: 12/14/2022   History of present illness Patient is 67 year old female admitted 11/29/22 with multifactorial dysphagia; s/p PEG tube 12/03/22.  Pt with hx of CVA with L hemiparesis, HTN, iliac stent, hep C, GERD   OT comments  Pt was received seated in the bedside chair & she was agreeable to therapy participation. Patient was further instructed on using her unaffected RUE to perform the LUE ROM and stretches at chair level, in order to decrease the risk for progressive tone abnormalities and to promote improved joint flexibility. She performed multiple reps of elbow extension, wrist extension, digit extension, and shoulder flexion. She reported having feelings of discomfort and stiffness of her LLE, with this OT performing gentle stretches/PROM. She further required assist for sit to stand transfers from the chair, then for a stand-pivot transfer to the bed, and for bed mobility. She expressed a readiness to discharge from the hospital and go to rehab. Continue OT plan of care.    Recommendations for follow up therapy are one component of a multi-disciplinary discharge planning process, led by the attending physician.  Recommendations may be updated based on patient status, additional functional criteria and insurance authorization.    Follow Up Recommendations  Skilled nursing-short term rehab (<3 hours/day)     Assistance Recommended at Discharge Frequent or constant Supervision/Assistance  Patient can return home with the following  A lot of help with bathing/dressing/bathroom;Assistance with cooking/housework;Direct supervision/assist for medications management;Assist for transportation;Help with stairs or ramp for entrance;A lot of help with walking and/or transfers   Equipment Recommendations  Other (comment) (to be determined pending progress at next setting)        Precautions / Restrictions Precautions Precautions: Fall Precaution Comments: L sided weakness from prior CVA Restrictions Weight Bearing Restrictions: No       Mobility Bed Mobility   Bed Mobility: Sit to Supine     Supine to sit: Mod assist, HOB elevated     General bed mobility comments: required use of bed rail and assist for BLE onto the bed    Transfers Overall transfer level: Needs assistance   Transfers: Sit to/from Stand Sit to Stand: Mod assist Stand pivot transfers: Mod assist         General transfer comment: Pt was instructed on stand pivot transfer from chair to bed, requiring verbal and tactile cues for LLE positioning and advancement, general sequencing for optimal technique and weight shifting         ADL either performed or assessed with clinical judgement   ADL Overall ADL's : Needs assistance/impaired Eating/Feeding: Sitting;Set up Eating/Feeding Details (indicate cue type and reason): requires assist to open packets and remove lids, given LUE weakness from prior CVA             Upper Body Dressing : Maximal assistance Upper Body Dressing Details (indicate cue type and reason): simulated in sitting Lower Body Dressing: Maximal assistance                       Cognition Arousal/Alertness: Awake/alert Behavior During Therapy: [facility] for tasks assessed/performed                                Pertinent Vitals/ Pain       Pain Assessment Pain Assessment: Faces Pain Score: 5  Pain Location: LUE and LLE  with ROM/stretches Pain Intervention(s): RN gave pain meds during session         Frequency  Min 2X/week        Progress Toward Goals  OT Goals(current goals can now be found in the care plan section)  Progress towards OT goals: Progressing toward goals  Acute Rehab OT Goals Patient Stated Goal: to get better and discharge soon OT Goal Formulation: With patient Time For Goal Achievement:  12/18/22 Potential to Achieve Goals: [place] Discharge plan remains appropriate       AM-PAC OT "6 Clicks" Daily Activity     Outcome Measure   Help from another person eating meals?: A Little Help from another person taking care of personal grooming?: A Little Help from another person toileting, which includes using toliet, bedpan, or urinal?: A Lot Help from another person bathing (including washing, rinsing, drying)?: A Lot Help from another person to put on and taking off regular upper body clothing?: A Lot Help from another person to put on and taking off regular lower body clothing?: A Lot 6 Click Score: 14    End of Session Equipment Utilized During Treatment: Gait belt  OT Visit Diagnosis: Unsteadiness on feet (R26.81);Muscle weakness (generalized) (M62.81)   Activity Tolerance Patient tolerated treatment well   Patient Left in bed;with call bell/phone within reach;with bed alarm set   Nurse Communication Mobility status        Time: 5747-3403 OT Time Calculation (min): 39 min  Charges: OT General Charges $OT Visit: 1 Visit OT Treatments $Therapeutic Activity: 38-52 mins     Leota Sauers, OTR/L 12/14/2022, 2:16 PM

## 2022-12-14 NOTE — Progress Notes (Signed)
Calorie Count Note  48 hour calorie count ordered.  Diet: Full liquid diet Supplements:  -Ensure Enlive po TID, each supplement provides 350 kcal and 20 grams of protein.  -Prosource Plus PO TID, each provides 100 kcals and 15g protein -Magic cup TID with meals, each supplement provides 290 kcal and 9 grams of protein    2/1: Breakfast: 50% =165 kcals, 1g protein Lunch: not ordered Dinner: 0% consumed, pt states she was in bed and couldn't eat Supplements: 2 Prosource + 1/2 Ensure =475 kcals, 40g protein  Total intake: 640 kcal (36% of minimum estimated needs)  41g protein (51% of minimum estimated needs)  2/2: Breakfast: 50% =145 kcals, 1g protein Lunch: pending Dinner: pending Supplements: 1 Prosource   Total intake:  kcal (% of minimum estimated needs)   protein (% of minimum estimated needs)  Nutrition Dx: Severe Malnutrition related to chronic illness, dysphagia (h/o CVA) as evidenced by moderate fat depletion, severe muscle depletion, energy intake < or equal to 75% for > or equal to 1 month.   Goal: Pt to meet >/= 90% of their estimated nutrition needs    Intervention:  -Continue Ensure, Magic Cup, Prosource Plus -Continue MVI and Thiamine -Add Boost Breeze TID for clear liquid option -Continue Calorie Count -Will need to resume tube feedings if PO does not improve  TF recommendations:  -Initiate Osmolite 1.5 @ 20 ml/hr, advance by 10 ml every 4 hours to goal rate of 40 ml/hr. -Provide 60 ml Prosource TF 20   -If patient refuses tube feeding and is not meeting needs PO, recommend Palliative care consult.  Clayton Bibles, MS, RD, LDN Inpatient Clinical Dietitian Contact information available via Amion

## 2022-12-14 NOTE — TOC Progression Note (Addendum)
Transition of Care [facility]) - Progression Note    Patient Details  Name: Kara Bailey MRN: [id number] Date of Birth: 1956-10-10  Transition of Care [facility]) CM/SW [place], RN Phone Number:[phone]  12/14/2022, 11:33 AM  Clinical Narrative:    TOC called [facility] to follow up on insurance denial. Admissions is currently working on another case and will follow up with insurance for denial letter which is what is needed before appeal can be filed.   1343 CM at bedside to update patient. Patient verbalizes understanding of insurance auth being denied. CM has explained to patient that we are awaiting letter of denial for the appeal process. Patient verbalizes understanding. Daughter Harriette Ohara has also been updated and made aware of initial denial.         Expected Discharge Plan and Services         Expected Discharge Date: 12/07/22                                     Social Determinants of Health (SDOH) Interventions SDOH Screenings   Food Insecurity: Unknown (12/04/2022)  Housing: Low Risk  (12/04/2022)  Transportation Needs: Unknown (12/04/2022)  Utilities: Unknown (12/04/2022)  Depression (PHQ2-9): High Risk (09/19/2022)  Tobacco Use: High Risk (12/03/2022)    Readmission Risk Interventions     No data to display

## 2022-12-15 DIAGNOSIS — I69391 Dysphagia following cerebral infarction: Secondary | ICD-10-CM | POA: Diagnosis not present

## 2022-12-15 LAB — COMPREHENSIVE METABOLIC PANEL
ALT: 23 U/L (ref 0–44)
AST: 15 U/L (ref 15–41)
Albumin: 2.6 g/dL — ABNORMAL LOW (ref 3.5–5.0)
Alkaline Phosphatase: 103 U/L (ref 38–126)
Anion gap: 6 (ref 5–15)
BUN: 17 mg/dL (ref 8–23)
CO2: 27 mmol/L (ref 22–32)
Calcium: 8.7 mg/dL — ABNORMAL LOW (ref 8.9–10.3)
Chloride: 103 mmol/L (ref 98–111)
Creatinine, Ser: 0.72 mg/dL (ref 0.44–1.00)
GFR, Estimated: >60 mL/min (ref >60)
Glucose, Bld: 92 mg/dL (ref 70–99)
Potassium: 3.2 mmol/L — ABNORMAL LOW (ref 3.5–5.1)
Sodium: 136 mmol/L (ref 135–145)
Total Bilirubin: 0.5 mg/dL (ref 0.3–1.2)
Total Protein: 5.8 g/dL — ABNORMAL LOW (ref 6.5–8.1)

## 2022-12-15 LAB — CBC
HCT: 29.5 % — ABNORMAL LOW (ref 36.0–46.0)
Hemoglobin: 9.2 g/dL — ABNORMAL LOW (ref 12.0–15.0)
MCH: 28.1 pg (ref 26.0–34.0)
MCHC: 31.2 g/dL (ref 30.0–36.0)
MCV: 90.2 fL (ref 80.0–100.0)
Platelets: 426 10*3/uL — ABNORMAL HIGH (ref 150–400)
RBC: 3.27 MIL/uL — ABNORMAL LOW (ref 3.87–5.11)
RDW: 17.8 % — ABNORMAL HIGH (ref 11.5–15.5)
WBC: 9.9 10*3/uL (ref 4.0–10.5)
nRBC: 0 % (ref 0.0–0.2)

## 2022-12-15 LAB — BASIC METABOLIC PANEL
Anion gap: 6 (ref 5–15)
BUN: 14 mg/dL (ref 8–23)
CO2: 26 mmol/L (ref 22–32)
Calcium: 8.9 mg/dL (ref 8.9–10.3)
Chloride: 105 mmol/L (ref 98–111)
Creatinine, Ser: 0.73 mg/dL (ref 0.44–1.00)
GFR, Estimated: >60 mL/min (ref >60)
Glucose, Bld: 105 mg/dL — ABNORMAL HIGH (ref 70–99)
Potassium: 4.5 mmol/L (ref 3.5–5.1)
Sodium: 137 mmol/L (ref 135–145)

## 2022-12-15 MED ORDER — POTASSIUM CHLORIDE 20 MEQ PO PACK
40.0000 meq | PACK | Freq: Every day | ORAL | Status: AC
  Administered 2022-12-15 – 2022-12-16 (×2): 40 meq
  Filled 2022-12-15 (×2): qty 2

## 2022-12-15 MED ORDER — CHOLESTYRAMINE LIGHT 4 G PO PACK
4.0000 g | PACK | Freq: Four times a day (QID) | ORAL | Status: AC
  Administered 2022-12-16 – 2022-12-17 (×7): 4 g
  Filled 2022-12-15 (×8): qty 1

## 2022-12-15 MED ORDER — SACCHAROMYCES BOULARDII 250 MG PO CAPS
250.0000 mg | ORAL_CAPSULE | Freq: Two times a day (BID) | ORAL | Status: DC
  Administered 2022-12-15 – 2023-01-14 (×60): 250 mg
  Filled 2022-12-15 (×60): qty 1

## 2022-12-15 NOTE — Progress Notes (Addendum)
PROGRESS NOTE    Kara Bailey  DJS:[id number] DOB: 06/22/1956 DOA: 11/29/2022 PCP: Andree Moro, DO     Brief Narrative:  H/o esophageal stenosis required dilation in 2017 and 2021, h/o HTN, HLD, CVA Presents to the hospital due to dysphagia ,Pt has not been able to tolerate liquids, pureed diet or any other form of nutrition. Unable to swallow any of her pills for at least 1 month.   Pt has not been on plavix for about 1 month.  Status post G-tube placement by IR.   Developed nausea, vomiting, abdominal pain on 1/26, CT abdomen/pelvis showed possible cholecystitis, MRCP confirmed acute cholecystitis. General surgery was following. Significant improvement abdominal pain, nausea, vomiting, liver enzymes. Plan is to continue antibiotics without any operative intervention.   Medically stable for discharge to SNF whenever possible.TOC following   Subjective:    Patient refused tube feed for the last 4 days due to diarrhea She denies ab pain, no n/v, no fever  She drinks small amount of ensure , getting meds and free water flushes per tube for the past 4 days by reports  She has dysarthria, communicate by gesturing and writing Multiple family members at bedside  Assessment & Plan:  Principal Problem:   Dysphagia as late effect of cerebrovascular accident (CVA) Active Problems:   Essential hypertension   Left hemiplegia ([place])   History of stroke   Dysphagia   Protein-calorie malnutrition, severe    Assessment and Plan:  * Dysphagia likely multifactorial   late effect of cerebrovascular accident (CVA)  Also has h/o esophageal dysphagia due to esophageal stenosis -mri brain did not show new CVA - -DG esophagus ",Grossly nondiagnostic evaluation with minimal barium consumed. Noted is a delay in the oral phase of swallowing. Suggestion of transient laryngeal penetration. No gross tracheal aspiration."  GI consulted  think dysphagia likely from stroke does not think EGD needed   -s/p peg placement on 1/22 -speech following, currently able to drink minimal amount of Ensure, meds/may nutrition per tube     Acute cholecystitis Likely acalculous cholecystitis.  Symptom has resolved, GEN surgery recommend conservative management with antibiotic and signed off Last dose antibiotic on 2/6  Diarrhea No ab pain, no leukocytosis D/c stool softener  D/c mag supplement per tube Add probiotics Will try cholestyramine per tube Consider banatrol tf to increase fiber content  Hypomagnesemia/hypokalemia/hypophosphatemia Replaced, improved   History of stroke/Left hemiplegia /dysarthria since 07/2022 Has not been able to tolerate meds by mouth for the last month prior to this admission Chronic left side pain , reports from prior stroke, reports iv morphine helped Resumed  Plavix, aspirin, Lipitor, Zetia. Also on baclofen and Zanaflex for left-sided hemiplegia and spasticity after peg tube placement, all meds per tube    Bacteriuria -UA is cloudy with moderate leukocyte many bacteria - urine culture + ecoli pan sensitive -she received antibiotics for cholecystitis which should cover this as well  H/o cirrhosis, H/o hepc per chart review, unknown details CT ab did not show sign of liver cirrhosis  Reviewed abdominal ultrasound from 2014 showed  Slightly nodular contours of the liver may indicate mild cirrhosis.   H/p Alcohol use Six pack beer on the weekends per record  Does not appear to have any sign of alcohol withdrawal symptoms in the hospital  Current everyday smoker Trying to cut back wearing patches   Severe malnutrition Body mass index is 17.9 kg/m. Weight 57kg two month ago,  on 1/19 she weigh 51.2 kg Signs/Symptoms: moderate  fat depletion, severe muscle depletion, energy intake < or equal to 75% for > or equal to 1 month Interventions: Tube feeding, MVI, Prostat  .   I have Reviewed nursing notes, Vitals, pain scores, I/o's, Lab results and   imaging results since pt's last encounter, details please see discussion above  I ordered the following labs:  Unresulted Labs (From admission, onward)     Start     Ordered   12/17/22 0500  CBC  Every Monday (0500),   R       Question:  Specimen collection method  Answer:  Lab=Lab collect   12/15/22 1939   12/16/22 0500  Magnesium  Tomorrow morning,   R       Question:  Specimen collection method  Answer:  Lab=Lab collect   12/15/22 1939   12/16/22 8341  Basic metabolic panel  Daily,   R     Question:  Specimen collection method  Answer:  Lab=Lab collect   12/15/22 1940   12/16/22 0500  Phosphorus  Tomorrow morning,   R       Question:  Specimen collection method  Answer:  Lab=Lab collect   12/15/22 1943   12/15/22 9622  Basic metabolic panel  Once,   R       Question:  Specimen collection method  Answer:  Lab=Lab collect   12/15/22 1940   11/29/22 1745  CBC with Differential  (ED Shortness of Breath)  Once,   STAT        11/29/22 1745             DVT prophylaxis: SCDs Start: 11/30/22 0151   Code Status:   Code Status: Full Code  Family Communication: Family at bedside on 2/3 with patient permission Disposition:   Dispo: The patient is from: home              Anticipated d/c is to: home              Anticipated d/c date is: awaiting for snf placement , patient previously lives with a  friend at home, she is currently too weak to go home, by reports she needs at least two assist to get up which is not her baseline   Antimicrobials:    Anti-infectives (From admission, onward)    Start     Dose/Rate Route Frequency Ordered Stop   12/11/22 2200  amoxicillin-clavulanate (AUGMENTIN) 400-57 MG/5ML suspension 875 mg        875 mg Per Tube Every 12 hours 12/11/22 1137 12/18/22 2159   12/11/22 1000  amoxicillin-clavulanate (AUGMENTIN) 875-125 MG per tablet 1 tablet  Status:  Discontinued        1 tablet Oral Every 12 hours 12/11/22 0821 12/11/22 1137   12/08/22 2000   piperacillin-tazobactam (ZOSYN) IVPB 3.375 g  Status:  Discontinued        3.375 g 12.5 mL/hr over 240 Minutes Intravenous Every 8 hours 12/08/22 1900 12/11/22 0821   12/07/22 1130  cefadroxil (DURICEF) capsule 500 mg  Status:  Discontinued        500 mg Oral 2 times daily 12/07/22 1033 12/07/22 1033   12/07/22 1130  cefadroxil (DURICEF) capsule 500 mg  Status:  Discontinued        500 mg Oral 2 times daily 12/07/22 1033 12/07/22 1034   12/07/22 1130  cefadroxil (DURICEF) capsule 500 mg  Status:  Discontinued        500 mg Per Tube 2 times daily 12/07/22 1034  12/08/22 1903   12/03/22 0700  ceFAZolin (ANCEF) IVPB 2g/100 mL premix        2 g 200 mL/hr over 30 Minutes Intravenous To Radiology 11/30/22 1602 12/04/22 0700          Objective: Vitals:   12/14/22 0427 12/14/22 1342 12/14/22 2005 12/15/22 0354  BP: 135/71 126/83 (!) 149/77 118/71  Pulse: 81 84 87 88  Resp: '18 15 18 18  '$ Temp: 97.6 F (36.4 C) 97.9 F (36.6 C) 98.2 F (36.8 C) 98.1 F (36.7 C)  TempSrc: Oral Oral Oral Oral  SpO2: 100% 100% 100% 100%  Weight:      Height:        Intake/Output Summary (Last 24 hours) at 12/15/2022 1947 Last data filed at 12/15/2022 1500 Gross per 24 hour  Intake 474 ml  Output 0 ml  Net 474 ml   Filed Weights   12/06/22 0418 12/08/22 0656 12/09/22 0546  Weight: 53 kg 53.7 kg 50.3 kg    Examination:  General exam: alert, awake, communicative,calm, NAD, + peG Respiratory system: Clear to auscultation. Respiratory effort normal. Cardiovascular system:  RRR.  Gastrointestinal system: Abdomen is nondistended, soft and nontender.  Normal bowel sounds heard. + peg Central nervous system: Alert and oriented.  Dysarthria, left hemiplegia. Extremities:  no edema Skin: No rashes, lesions or ulcers Psychiatry: Judgement and insight appear normal. Mood & affect appropriate.     Data Reviewed: I have personally reviewed  labs and visualized  imaging studies since the last encounter and  formulate the plan        Scheduled Meds:  (feeding supplement) PROSource Plus  30 mL Oral TID BM   amoxicillin-clavulanate  875 mg Per Tube Q12H   aspirin  81 mg Per Tube Daily   atorvastatin  80 mg Per Tube QPC supper   cholestyramine light  4 g Per Tube QID   clopidogrel  75 mg Per Tube Daily   diclofenac Sodium  4 g Topical QID   ezetimibe  10 mg Per Tube Daily   feeding supplement  1 Container Oral TID BM   feeding supplement  237 mL Oral TID BM   free water  100 mL Per Tube Q4H   melatonin  3 mg Per Tube QHS   multivitamin  15 mL Per Tube Daily   pantoprazole (PROTONIX) IV  40 mg Intravenous Q12H   potassium chloride  40 mEq Per Tube Daily   thiamine  100 mg Per Tube Daily   tiZANidine  2 mg Per Tube BID   Continuous Infusions:  feeding supplement (OSMOLITE 1.5 CAL) Stopped (12/12/22 1900)     LOS: 14 days   Time spent: 42mns  FFlorencia Reasons MD PhD FACP [facility]  Available via Epic secure chat 7am-7pm for nonurgent issues Please page for urgent issues To page the attending provider between 7A-7P or the covering provider during after hours 7P-7A, please log into the web site [email or web address] and access using universal [place] password for that web site. If you do not have the password, please call the hospital operator.    12/15/2022, 7:47 PM

## 2022-12-15 NOTE — Plan of Care (Signed)

## 2022-12-15 NOTE — Progress Notes (Signed)
Physical Therapy Treatment Patient Details Name: Kara Bailey MRN: [id number] DOB: 12/07/1955 Today's Date: 12/15/2022   History of Present Illness Patient is 67 year old female admitted 11/29/22 with multifactorial dysphagia; s/p PEG tube 12/03/22.  Pt with hx of CVA with L hemiparesis, HTN, iliac stent, hep C, GERD    PT Comments    Pt assisted with performing LE exercises in supine and sitting.  Pt also performed sit to stands x3 for strengthening.  Pt fatigued quickly and repositioned to comfort upon return to supine.  Continue to recommend SNF upon d/c at this time.    Recommendations for follow up therapy are one component of a multi-disciplinary discharge planning process, led by the attending physician.  Recommendations may be updated based on patient status, additional functional criteria and insurance authorization.  Follow Up Recommendations  Skilled nursing-short term rehab (<3 hours/day) Can patient physically be transported by private vehicle: No   Assistance Recommended at Discharge Frequent or constant Supervision/Assistance  Patient can return home with the following A little help with bathing/dressing/bathroom;Assistance with cooking/housework;Help with stairs or ramp for entrance;A lot of help with walking and/or transfers   Equipment Recommendations  None recommended by PT    Recommendations for Other Services       Precautions / Restrictions Precautions Precautions: Fall Precaution Comments: hx of CVA with left-sided hemiplegia and spasticity     Mobility  Bed Mobility Overal bed mobility: Needs Assistance Bed Mobility: Sit to Supine, Supine to Sit     Supine to sit: Min assist Sit to supine: Min assist   General bed mobility comments: pt utilized bed rail to self assist, assist for Lt LE required    Transfers Overall transfer level: Needs assistance Equipment used: 1 person hand held assist Transfers: Sit to/from Stand Sit to Stand: Mod assist            General transfer comment: pt heavily reliant on right side; requiring mod assist to rise, steady and control descent, does not tend to take weight through Lt LE once standing (reports she prefers not to); performed x3 with strengthening, only able to hold standing approx 30 sec    Ambulation/Gait                   Stairs             Wheelchair Mobility    Modified Rankin (Stroke Patients Only)       Balance Overall balance assessment: Needs assistance Sitting-balance support: No upper extremity supported Sitting balance-Leahy Scale: Fair     Standing balance support: Single extremity supported Standing balance-Leahy Scale: Zero Standing balance comment: heavily reliant on R UE support and assist from therapist to maintain static standing                            Cognition Arousal/Alertness: Awake/alert Behavior During Therapy: WFL for tasks assessed/performed Overall Cognitive Status: Within Functional Limits for tasks assessed                                 General Comments: dysarthric speech, difficult to understand, low volume however cooperative and following simple commands        Exercises General Exercises - Lower Extremity Ankle Circles/Pumps: AROM, Right, 10 reps Long Arc Quad: Seated, 10 reps, Left, AROM (AROM on Rt 10 reps supine) Heel Slides: AAROM, Left, 10 reps (  AROM on Rt 10 reps supine) Hip ABduction/ADduction: 10 reps, AAROM, Left (AROM on Rt 10 reps supine)    General Comments        Pertinent Vitals/Pain Pain Assessment Pain Assessment: No/denies pain    Home Living                          Prior Function            PT Goals (current goals can now be found in the care plan section) Acute Rehab PT Goals PT Goal Formulation: With patient Time For Goal Achievement: 12/29/22 Potential to Achieve Goals: Good Progress towards PT goals: Progressing toward goals     Frequency    Min 2X/week      PT Plan Current plan remains appropriate    Co-evaluation              AM-PAC PT "6 Clicks" Mobility   Outcome Measure  Help needed turning from your back to your side while in a flat bed without using bedrails?: A Little Help needed moving from lying on your back to sitting on the side of a flat bed without using bedrails?: A Lot Help needed moving to and from a bed to a chair (including a wheelchair)?: A Lot Help needed standing up from a chair using your arms (e.g., wheelchair or bedside chair)?: A Lot Help needed to walk in hospital room?: Total Help needed climbing 3-5 steps with a railing? : Total 6 Click Score: 11    End of Session   Activity Tolerance: Patient tolerated treatment well Patient left: in bed;with call bell/phone within reach;with bed alarm set   PT Visit Diagnosis: Other abnormalities of gait and mobility (R26.89);Unsteadiness on feet (R26.81)     Time: 1345-1401 PT Time Calculation (min) (ACUTE ONLY): 16 min  Charges:  $Therapeutic Exercise: 8-22 mins                    Jannette Spanner PT, DPT Physical Therapist Acute Rehabilitation Services Preferred contact method: Secure Chat Weekend Pager Only: [phone] Office: [place] 12/15/2022, 2:19 PM

## 2022-12-16 DIAGNOSIS — I69391 Dysphagia following cerebral infarction: Secondary | ICD-10-CM | POA: Diagnosis not present

## 2022-12-16 LAB — BASIC METABOLIC PANEL
Anion gap: 9 (ref 5–15)
BUN: 13 mg/dL (ref 8–23)
CO2: 24 mmol/L (ref 22–32)
Calcium: 8.9 mg/dL (ref 8.9–10.3)
Chloride: 105 mmol/L (ref 98–111)
Creatinine, Ser: 0.68 mg/dL (ref 0.44–1.00)
GFR, Estimated: >60 mL/min (ref >60)
Glucose, Bld: 84 mg/dL (ref 70–99)
Potassium: 4.2 mmol/L (ref 3.5–5.1)
Sodium: 138 mmol/L (ref 135–145)

## 2022-12-16 LAB — MAGNESIUM: Magnesium: 2.2 mg/dL (ref 1.7–2.4)

## 2022-12-16 LAB — PHOSPHORUS: Phosphorus: 3.7 mg/dL (ref 2.5–4.6)

## 2022-12-16 MED ORDER — ASPIRIN 81 MG PO CHEW: 81.0000 mg | CHEWABLE_TABLET | Freq: Every day | ORAL | Status: DC

## 2022-12-16 MED ORDER — BANATROL TF EN LIQD
60.0000 mL | Freq: Two times a day (BID) | ENTERAL | Status: DC
  Administered 2022-12-16 – 2022-12-20 (×8): 60 mL
  Filled 2022-12-16 (×8): qty 60

## 2022-12-16 MED ORDER — FAMOTIDINE 40 MG/5ML PO SUSR
10.0000 mg | Freq: Every day | ORAL | Status: DC
  Administered 2022-12-16 – 2022-12-26 (×11): 10.4 mg
  Filled 2022-12-16 (×11): qty 2.5

## 2022-12-16 MED ORDER — KATE FARMS STANDARD 1.4 PO LIQD
325.0000 mL | Freq: Two times a day (BID) | ORAL | Status: DC
  Administered 2022-12-17: 325 mL via ORAL
  Filled 2022-12-16 (×4): qty 325

## 2022-12-16 MED ORDER — SUCRALFATE 1 GM/10ML PO SUSP
1.0000 g | Freq: Four times a day (QID) | ORAL | Status: AC
  Administered 2022-12-16 – 2022-12-17 (×4): 1 g
  Filled 2022-12-16 (×4): qty 10

## 2022-12-16 NOTE — Progress Notes (Addendum)
PROGRESS NOTE    Kara Bailey  PTW:[id number] DOB: 1956/09/05 DOA: 11/29/2022 PCP: Andree Moro, DO     Brief Narrative:  H/o esophageal stenosis required dilation in 2017 and 2021, h/o HTN, HLD, CVA Presents to the hospital due to dysphagia ,Pt has not been able to tolerate liquids, pureed diet or any other form of nutrition. Unable to swallow any of her pills for at least 1 month.   Pt has not been on plavix for about 1 month.  Status post G-tube placement by IR.   Developed nausea, vomiting, abdominal pain on 1/26, CT abdomen/pelvis showed possible cholecystitis, MRCP confirmed acute cholecystitis. Gen surg recommended conservative management with antibiotics , improved,   By reports, she  refused tube feeds for about 4-5 days due to diarrhea, meds and tube feeds adjusted on 2/3 , hopefully  diarrhea will improve, then d/c to snf    Subjective:   Meds adjusted,  diarrhea may be better, she agreed to go back on tube feeds today,  she denies ab pain, no n/v, no fever  She reports drinks small amount of ensure , getting meds and free water flushes per tube for the last week   She has dysarthria, communicate by gesturing and writing   Assessment & Plan:  Principal Problem:   Dysphagia as late effect of cerebrovascular accident (CVA) Active Problems:   Essential hypertension   Left hemiplegia (HCC)   History of stroke   Dysphagia   Protein-calorie malnutrition, severe    Assessment and Plan:  * Dysphagia likely multifactorial   late effect of cerebrovascular accident (CVA)  Also has h/o esophageal dysphagia due to esophageal stenosis -mri brain did not show new CVA - -DG esophagus ",Grossly nondiagnostic evaluation with minimal barium consumed. Noted is a delay in the oral phase of swallowing. Suggestion of transient laryngeal penetration. No gross tracheal aspiration."  GI consulted  think dysphagia likely from stroke does not think EGD needed  -s/p peg placement  on 1/22 -speech following, currently able to drink minimal amount of Ensure, meds/nutrition per tube     Acute cholecystitis/Likely acalculous cholecystitis Symptom started on 1/26 after admission Symptom has resolved, lft normalized GEN surgery recommend conservative management with antibiotic and signed off Last dose antibiotic on 2/6  Diarrhea No ab pain, no leukocytosis D/c stool softener  D/c mag supplement per tube Add probiotics Start cholestyramine per tube Trial of Carafate per tube Start banatrol tf to increase fiber content She also reports lactose intolerance, DC Ensure, change to [employer].  Hypomagnesemia/hypokalemia/hypophosphatemia Replaced, improved   History of stroke/Left hemiplegia /dysarthria since 07/2022 Has not been able to tolerate meds by mouth for the last month prior to this admission Chronic left side pain , reports from prior stroke, reports iv morphine helped Resumed  Plavix, aspirin, Lipitor, Zetia. Also on baclofen and Zanaflex for left-sided hemiplegia and spasticity after peg tube placement, all meds per tube    Bacteriuria -UA is cloudy with moderate leukocyte many bacteria - urine culture + ecoli pan sensitive -she received antibiotics for cholecystitis which should cover this as well  H/o cirrhosis, H/o hepc per chart review, unknown details CT ab did not show sign of liver cirrhosis  Reviewed abdominal ultrasound from 2014 showed  Slightly nodular contours of the liver may indicate mild cirrhosis.   H/p Alcohol use Six pack beer on the weekends per record  Does not appear to have any sign of alcohol withdrawal symptoms in the hospital  Current everyday smoker  Trying to cut back wearing patches   Severe malnutrition Body mass index is 17.9 kg/m. Weight 57kg two month ago,  on 1/19 she weigh 51.2 kg Signs/Symptoms: moderate fat depletion, severe muscle depletion, energy intake < or equal to 75% for > or equal to 1  month Interventions: Tube feeding, MVI, Prostat  .   I have Reviewed nursing notes, Vitals, pain scores, I/o's, Lab results and  imaging results since pt's last encounter, details please see discussion above  I ordered the following labs:  Unresulted Labs (From admission, onward)     Start     Ordered   12/17/22 0500  CBC  Every Monday (0500),   R       Question:  Specimen collection method  Answer:  Lab=Lab collect   12/15/22 1939   12/16/22 4010  Basic metabolic panel  Daily,   R     Question:  Specimen collection method  Answer:  Lab=Lab collect   12/15/22 1940   11/29/22 1745  CBC with Differential  (ED Shortness of Breath)  Once,   STAT        11/29/22 1745             DVT prophylaxis: SCDs Start: 11/30/22 0151   Code Status:   Code Status: Full Code  Family Communication: Family at bedside on 2/3 with patient permission Disposition:   Dispo: The patient is from: home              Anticipated d/c is to: SNF?              Anticipated d/c date is: awaiting for snf placement , patient previously lives with a  friend at home, she is currently too weak to go home, by reports she needs at least two assists to get up which is not her baseline   Antimicrobials:    Anti-infectives (From admission, onward)    Start     Dose/Rate Route Frequency Ordered Stop   12/11/22 2200  amoxicillin-clavulanate (AUGMENTIN) 400-57 MG/5ML suspension 875 mg        875 mg Per Tube Every 12 hours 12/11/22 1137 12/18/22 2159   12/11/22 1000  amoxicillin-clavulanate (AUGMENTIN) 875-125 MG per tablet 1 tablet  Status:  Discontinued        1 tablet Oral Every 12 hours 12/11/22 0821 12/11/22 1137   12/08/22 2000  piperacillin-tazobactam (ZOSYN) IVPB 3.375 g  Status:  Discontinued        3.375 g 12.5 mL/hr over 240 Minutes Intravenous Every 8 hours 12/08/22 1900 12/11/22 0821   12/07/22 1130  cefadroxil (DURICEF) capsule 500 mg  Status:  Discontinued        500 mg Oral 2 times daily 12/07/22  1033 12/07/22 1033   12/07/22 1130  cefadroxil (DURICEF) capsule 500 mg  Status:  Discontinued        500 mg Oral 2 times daily 12/07/22 1033 12/07/22 1034   12/07/22 1130  cefadroxil (DURICEF) capsule 500 mg  Status:  Discontinued        500 mg Per Tube 2 times daily 12/07/22 1034 12/08/22 1903   12/03/22 0700  ceFAZolin (ANCEF) IVPB 2g/100 mL premix        2 g 200 mL/hr over 30 Minutes Intravenous To Radiology 11/30/22 1602 12/04/22 0700          Objective: Vitals:   12/15/22 0354 12/15/22 2033 12/16/22 0510 12/16/22 1441  BP: 118/71 116/64 116/60 (!) 89/55  Pulse: 88 92 79  84  Resp: '18 18 18 16  '$ Temp: 98.1 F (36.7 C) 98.1 F (36.7 C) 97.7 F (36.5 C) 97.6 F (36.4 C)  TempSrc: Oral Oral Oral Oral  SpO2: 100% 99% 100% 100%  Weight:      Height:       No intake or output data in the 24 hours ending 12/16/22 1653  Filed Weights   12/06/22 0418 12/08/22 0656 12/09/22 0546  Weight: 53 kg 53.7 kg 50.3 kg    Examination:  General exam: alert, awake, communicative,calm, NAD, + peG Respiratory system: Clear to auscultation. Respiratory effort normal. Cardiovascular system:  RRR.  Gastrointestinal system: Abdomen is nondistended, soft and nontender.  Normal bowel sounds heard. + peg Central nervous system: Alert and oriented.  Dysarthria, left hemiplegia. Extremities:  no edema Skin: No rashes, lesions or ulcers Psychiatry: Judgement and insight appear normal. Mood & affect appropriate.     Data Reviewed: I have personally reviewed  labs and visualized  imaging studies since the last encounter and formulate the plan        Scheduled Meds:  (feeding supplement) PROSource Plus  30 mL Oral TID BM   amoxicillin-clavulanate  875 mg Per Tube Q12H   aspirin  81 mg Per Tube Daily   atorvastatin  80 mg Per Tube QPC supper   cholestyramine light  4 g Per Tube QID   clopidogrel  75 mg Per Tube Daily   diclofenac Sodium  4 g Topical QID   ezetimibe  10 mg Per Tube  Daily   famotidine  10.4 mg Per Tube QHS   feeding supplement  1 Container Oral TID BM   feeding supplement  237 mL Oral TID BM   fiber supplement (BANATROL TF)  60 mL Per Tube BID   free water  100 mL Per Tube Q4H   melatonin  3 mg Per Tube QHS   multivitamin  15 mL Per Tube Daily   saccharomyces boulardii  250 mg Per Tube BID   thiamine  100 mg Per Tube Daily   tiZANidine  2 mg Per Tube BID   Continuous Infusions:  feeding supplement (OSMOLITE 1.5 CAL) 1,000 mL (12/16/22 0800)     LOS: 15 days   Time spent: 12mns  FFlorencia Reasons MD PhD FACP [facility]  Available via Epic secure chat 7am-7pm for nonurgent issues Please page for urgent issues To page the attending provider between 7A-7P or the covering provider during after hours 7P-7A, please log into the web site [email or web address] and access using universal [place] password for that web site. If you do not have the password, please call the hospital operator.    12/16/2022, 4:53 PM

## 2022-12-17 DIAGNOSIS — I69391 Dysphagia following cerebral infarction: Secondary | ICD-10-CM | POA: Diagnosis not present

## 2022-12-17 LAB — BASIC METABOLIC PANEL
Anion gap: 7 (ref 5–15)
BUN: 14 mg/dL (ref 8–23)
CO2: 23 mmol/L (ref 22–32)
Calcium: 8.9 mg/dL (ref 8.9–10.3)
Chloride: 105 mmol/L (ref 98–111)
Creatinine, Ser: 0.57 mg/dL (ref 0.44–1.00)
GFR, Estimated: >60 mL/min (ref >60)
Glucose, Bld: 104 mg/dL — ABNORMAL HIGH (ref 70–99)
Potassium: 4.6 mmol/L (ref 3.5–5.1)
Sodium: 135 mmol/L (ref 135–145)

## 2022-12-17 LAB — CBC
HCT: 29.3 % — ABNORMAL LOW (ref 36.0–46.0)
Hemoglobin: 9 g/dL — ABNORMAL LOW (ref 12.0–15.0)
MCH: 28.4 pg (ref 26.0–34.0)
MCHC: 30.7 g/dL (ref 30.0–36.0)
MCV: 92.4 fL (ref 80.0–100.0)
Platelets: 416 10*3/uL — ABNORMAL HIGH (ref 150–400)
RBC: 3.17 MIL/uL — ABNORMAL LOW (ref 3.87–5.11)
RDW: 18.1 % — ABNORMAL HIGH (ref 11.5–15.5)
WBC: 10.1 10*3/uL (ref 4.0–10.5)
nRBC: 0 % (ref 0.0–0.2)

## 2022-12-17 NOTE — Progress Notes (Signed)
Nutrition Follow-up  DOCUMENTATION CODES:   Severe malnutrition in context of chronic illness  INTERVENTION:   -Continue Osmolite 1.5 @ 40 ml/hr via PEG  -Prosource Plus PO TID, each provides 100 kcals and 15g protein  -[employer] 1.4 PO BID, each provides 455 kcals and 20g protein  -Free water: 100 ml every 4 hours (600 ml) -Multivitamin with minerals daily -100 mg Thiamine daily  NUTRITION DIAGNOSIS:   Severe Malnutrition related to chronic illness, dysphagia (h/o CVA) as evidenced by moderate fat depletion, severe muscle depletion, energy intake < or equal to 75% for > or equal to 1 month.  Ongoing.  GOAL:   Patient will meet greater than or equal to 90% of their needs  Progressing.  MONITOR:   PO intake, Supplement acceptance, Labs, Weight trends, I & O's  REASON FOR ASSESSMENT:   Consult Enteral/tube feeding initiation and management  ASSESSMENT:   67 year old African-American female history of multiple strokes, dense left hemiparesis, hypertension who presents to the ER today with worsening dysphagia.  Patient in room, states she can't really eat d/t tube feedings. RN reports pt is scared to try [employer] d/t continued diarrhea. Please note pt was having diarrhea during the 4 days off tube feeding. Ensure was stopped d/t lactose intolerance, Ensure is lactose-free. Pt was ordered [employer], if tolerates and diarrhea is still a frequent issue despite current interventions (imodium, Banatrol), will consider switch to [facility] via G-tube.   Osmolite 1.5 currently infusing at 40 ml/hr, recommend continue at this rate. This provides 1440 kcals, 60g protein and 731 ml H2O. Pt is taking Prosource supplements, ordered for 3 a day. If consumes these +Osmolite 1.5 @ 40 this provides ~1740 kcals and 105g protein. Meeting 99-100% of needs.   Admission weight: 112 lbs Current weight: 108 lbs  Medications: Pepcid, Banatrol, Florastor, Carafate, Imodium, Thiamine  Labs  reviewed.  Diet Order:   Diet Order             Diet full liquid Room service appropriate? Yes; Fluid consistency: Thin  Diet effective now           Diet general                   EDUCATION NEEDS:   Education needs have been addressed  Skin:  Skin Assessment: Reviewed RN Assessment  Last BM:  2/4 -type 7  Height:   Ht Readings from Last 1 Encounters:  11/30/22 '5\' 6"'$  (1.676 m)    Weight:   Wt Readings from Last 1 Encounters:  12/17/22 49.4 kg    BMI:  Body mass index is 17.58 kg/m.  Estimated Nutritional Needs:   Kcal:  1610-9604  Protein:  80-95g  Fluid:  1.8L/day   Clayton Bibles, MS, RD, LDN Inpatient Clinical Dietitian Contact information available via Amion

## 2022-12-17 NOTE — TOC Progression Note (Signed)
Transition of Care [facility]) - Progression Note    Patient Details  Name: Kara Bailey MRN: [id number] Date of Birth: 1956-04-28  Transition of Care [facility]) CM/SW [place], RN Phone Number:[phone]  12/17/2022, 9:56 AM  Clinical Narrative:    CM at bedside to speak with patient about appeal information. Patient requested that CM give daughter [name] information to file appeal. CM called daughter [name] and provided her with telephone number to file appeal.      Barriers to Discharge: SNF Authorization Denied  Expected Discharge Plan and Services         Expected Discharge Date: 12/07/22                                     Social Determinants of Health ([place]) Interventions SDOH Screenings   Food Insecurity: Unknown (12/04/2022)  Housing: Low Risk  (12/04/2022)  Transportation Needs: Unknown (12/04/2022)  Utilities: Unknown (12/04/2022)  Depression (PHQ2-9): High Risk (09/19/2022)  Tobacco Use: High Risk (12/03/2022)    Readmission Risk Interventions     No data to display

## 2022-12-17 NOTE — Progress Notes (Signed)
Day RN reported several loose stools today.  Patient agrees to keep rate at 40 for tonight until diarrhea under control. Will reevaluate in am and proceed to goal rate if tolerating tube feeding.Roderick Pee

## 2022-12-17 NOTE — Progress Notes (Signed)
Patient states that she dislikes the taste of the [facility] supplement and cannot tolerate it therefor no oral intake

## 2022-12-17 NOTE — TOC Initial Note (Signed)
Transition of Care [facility]) - Initial/Assessment Note    Patient Details  Name: Kara Bailey MRN: [id number] Date of Birth: 06/27/1956  Transition of Care [facility]) CM/SW Contact:    Angelita Ingles, RN Phone [phone]  12/17/2022, 1:48 PM  Clinical Narrative:                 TOC completed assessment for patient  considered high risk for readmission. CM at bedside with patient. Patient states that she is from home with her daughter and that the plan is to go to rehab to build her strength where she can  take care of herself more. Patient reports that daughter [name] assist with her care. Patient states that she does have a PCP that she follows up with on a regular basis. Patient is unable to recall name of MD. Patient does acknowledge that she does have access to medications and that she has no difficulty obtaining them. Patient reports that daughter assist here with transportation to appointments and with getting medications. Reported DME includes cane and walker. Patient has friend at bedside that states that he use to assist but it became too much so now the daughter is the primary caregiver for the patients wellbeing. Patient verbalizes understanding of plan for SNF. TOC is following and will assist with disposition planning as needed.   Expected Discharge Plan: Skilled Nursing Facility Barriers to Discharge: SNF Authorization Denied   Patient Goals and CMS Choice Patient states their goals for this hospitalization and ongoing recovery are:: Patient states that she would like to go to rehab to get stronger. CMS Medicare.gov Compare Post Acute Care list provided to:: Patient (son) Choice offered to / list presented to : Patient, Adult [place] ownership interest in [facility].provided to:: Adult Children    Expected Discharge Plan and Services In-house Referral: NA Discharge Planning Services: CM Consult Post Acute Care Choice: [place] Living  arrangements for the past 2 months: Apartment Expected Discharge Date: 12/07/22               DME Arranged: N/A DME Agency: NA       HH Arranged: NA [place] Agency: NA        Prior Living Arrangements/Services Living arrangements for the past 2 months: Apartment Lives with:: Adult Children Patient language and need for interpreter reviewed:: Yes Do you feel safe going back to the place where you live?: Yes      Need for Family Participation in Patient Care: Yes (Comment) Care giver support system in place?: Yes (comment) Current home services: DME (cane and walker) Criminal Activity/Legal Involvement Pertinent to Current Situation/Hospitalization: No - Comment as needed  Activities of Daily Living Home Assistive Devices/Equipment: None ADL Screening (condition at time of admission) Patient's cognitive ability adequate to safely complete daily activities?: Yes Is the patient deaf or have difficulty hearing?: No Does the patient have difficulty seeing, even when wearing glasses/contacts?: No Does the patient have difficulty concentrating, remembering, or making decisions?: No Patient able to express need for assistance with ADLs?: Yes Does the patient have difficulty dressing or bathing?: Yes Independently performs ADLs?: No Communication: Needs assistance Is this a change from baseline?: Pre-admission baseline Grooming: Needs assistance Is this a change from baseline?: Pre-admission baseline Feeding: Needs assistance Is this a change from baseline?: Pre-admission baseline Bathing: Needs assistance Is this a change from baseline?: Pre-admission baseline Toileting: Needs assistance Is this a change from baseline?: Pre-admission baseline In/Out Bed: Needs assistance Is this  a change from baseline?: Pre-admission baseline Walks in Home: Needs assistance Is this a change from baseline?: Pre-admission baseline Does the patient have difficulty walking or climbing stairs?:  Yes Weakness of Legs: Left Weakness of Arms/Hands: Left  Permission Sought/Granted Permission sought to share information with : Family Supports Permission granted to share information with : No, Yes, Verbal Permission Granted  Share Information with NAME: [name]     Permission granted to share info w Relationship: daughter  Permission granted to share info w Contact Information: [phone]  Emotional Assessment Appearance:: Appears stated age Attitude/Demeanor/Rapport: Gracious Affect (typically observed): Accepting Orientation: : Oriented to Self, Oriented to Place, Oriented to  Time, Oriented to Situation Alcohol / Substance Use: Not Applicable Psych Involvement: No (comment)  Admission diagnosis:  History of stroke [Z86.73] Failure to thrive in adult [R62.7] Dysphagia as late effect of cerebrovascular accident (CVA) [I69.391] Dysphagia, unspecified type [R13.10] Dysphagia [R13.10] Patient Active Problem List   Diagnosis Date Noted   Protein-calorie malnutrition, severe 12/04/2022   History of stroke 11/29/2022   Dysphagia as late effect of cerebrovascular accident (CVA) 11/29/2022   Stroke ([place]) 07/26/2022   Left hemiplegia ([place]) 07/26/2022   Peripheral arterial disease ([place]) 12/20/2020   History of esophageal stricture 07/08/2020   Insomnia 12/02/2017   Chronic cough 08/14/2017   Poor appetite 08/29/2016   Peptic stricture of esophagus    History of colonic polyps    Hiatal hernia 03/16/2016   Dysphagia 03/16/2016   Alcohol abuse 10/26/2015   Essential hypertension 09/26/2015   PAC (premature atrial contraction) 10/11/2014   PVC (premature ventricular contraction) 10/11/2014   Back muscle spasm 06/15/2014   Generalized anxiety disorder 06/15/2014   Smoking 06/15/2014   Chronic hepatitis C without hepatic coma ([place]) 05/28/2013   PCP:  Andree Moro, DO Pharmacy:   [facility] [place], [place] [address] [place] [address] Phone: [phone] Fax: [phone]  [facility] [place], [address] [place] [address] Phone: [phone] Fax: [phone]     Social Determinants of Health (SDOH) Social History: SDOH Screenings   Food Insecurity: Unknown (12/04/2022)  Housing: Low Risk  (12/04/2022)  Transportation Needs: Unknown (12/04/2022)  Utilities: Unknown (12/04/2022)  Depression (PHQ2-9): High Risk (09/19/2022)  Tobacco Use: High Risk (12/03/2022)   SDOH Interventions: Housing Interventions: Patient Refused   Readmission Risk Interventions    12/17/2022    1:42 PM  Readmission Risk Prevention Plan  Transportation Screening Complete  PCP or Specialist Appt within 3-5 Days Complete  HRI or Home Care Consult Complete  Social Work Consult for [place] Planning/Counseling Complete  Palliative Care Screening Not Applicable  Medication Review Press photographer) Referral to Pharmacy

## 2022-12-17 NOTE — Progress Notes (Signed)
Progress Note   Patient: Kara Bailey ZOX:[id number] DOB: September 12, 1956 DOA: 11/29/2022     16 DOS: the patient was seen and examined on 12/17/2022   Brief hospital course: H/o esophageal stenosis required dilation in 2017 and 2021, h/o HTN, HLD, CVA Presents to the hospital due to dysphagia ,Pt has not been able to tolerate liquids, pureed diet or any other form of nutrition. Unable to swallow any of her pills for at least 1 month.  Status post G-tube placement by IR on 1/22.    She is currently awaiting SNF placement.  Assessment and Plan:  Dysphagia likely multifactorial Esophageal dysphagia due to esophageal stenosis History of CVA Severe malnutrition S/p PEG tube placement 11/2020.  Nutrition and speech are following and patient is currently able to drink [place] is unsure.  Mostly getting feeds through her PEG tube.  Unfortunately she is having significant diarrhea as well.  Nutrition is seen patient today and noted that there was occurring prior to ensure, which was stopped due to suspected lactose intolerance.  However Ensure is lactose-free. Likely diarrhea from abx.  -Nutrition consulted: -Continue Osmolite 1.5 @ 40 ml/hr via PEG -Prosource Plus PO TID, -[place] 1.4 PO BID -Free water: 100 ml every 4 hours (600 ml) -Multivitamin with minerals daily -100 mg Thiamine daily -Fiber supplement 60 mL twice daily  Acute cholecystitis Symptoms started on 1/26 after admission.  General surgery was consulted and recommended conservative management antibiotics and had signed off.   -Augmentin 1/30 - 2/6  Diarrhea As above is having significant diarrhea.  Chart reports of refusing to be here for 5 days due to diarrhea.  Tube feeds are just on 2/3 and more open for diarrhea-resolved.  However he has persisted.  Question if diarrhea is related to antibiotic use, which complete 2/6, instead of from TF. Has no other features to suggest infectious cause so will hold off on additional  testing, though if persistent will obtain c. Diff, GI PCR. -as above  History of stroke Left hemiplegia Dysarthria Hx of stroke 07/2022 resulting in left-sided hemiplegia and spasticity, dysarthria.  -continue plavix, aspirin, lipitor and zetia -tizanadine, morphine for pain  History of cirrhosis History of alcohol use Currently well compensated.   Current smoker -NRT  Chronic normocytic anemia Stable.      Subjective: NAOE.  Patient reports still having significant diarrhea.  Denies any abdominal pain at this time.  No fevers overnight.  Denies any urinary symptoms.  No other complaints at this time.  Physical Exam: Vitals:   12/16/22 1703 12/16/22 2030 12/17/22 0418 12/17/22 0458  BP: 118/72 130/65  119/63  Pulse: 81 90  80  Resp:  18  18  Temp:  98.1 F (36.7 C)  98 F (36.7 C)  TempSrc:  Oral  Oral  SpO2:  96%  100%  Weight:   49.4 kg   Height:       Physical Exam Vitals and nursing note reviewed.  Constitutional:      General: She is not in acute distress. HENT:     Head: Normocephalic and atraumatic.     Mouth/Throat:     Mouth: Mucous membranes are moist.  Eyes:     Pupils: Pupils are equal, round, and reactive to light.  Cardiovascular:     Rate and Rhythm: Normal rate and regular rhythm.  Pulmonary:     Effort: Pulmonary effort is normal. No respiratory distress.  Abdominal:     General: Abdomen is flat. There is no  distension.     Palpations: Abdomen is soft.     Tenderness: There is no abdominal tenderness.     Comments: PEG tube C/D/I  Musculoskeletal:     Right lower leg: No edema.     Left lower leg: No edema.  Skin:    General: Skin is warm and dry.     Capillary Refill: Capillary refill takes less than 2 seconds.  Neurological:     Mental Status: She is alert and oriented to person, place, and time.  Psychiatric:        Mood and Affect: Mood normal.        Behavior: Behavior normal.     Data Reviewed:     Latest Ref Rng & Units  12/17/2022    5:43 AM 12/15/2022    6:23 AM 12/13/2022    5:57 AM  CBC  WBC 4.0 - 10.5 K/uL 10.1  9.9  9.1   Hemoglobin 12.0 - 15.0 g/dL 9.0  9.2  9.1   Hematocrit 36.0 - 46.0 % 29.3  29.5  29.2   Platelets 150 - 400 K/uL 416  426  374       Latest Ref Rng & Units 12/17/2022    5:43 AM 12/16/2022    6:37 AM 12/15/2022    7:56 PM  BMP  Glucose 70 - 99 mg/dL 104  84  105   BUN 8 - 23 mg/dL '14  13  14   '$ Creatinine 0.44 - 1.00 mg/dL 0.57  0.68  0.73   Sodium 135 - 145 mmol/L 135  138  137   Potassium 3.5 - 5.1 mmol/L 4.6  4.2  4.5   Chloride 98 - 111 mmol/L 105  105  105   CO2 22 - 32 mmol/L '23  24  26   '$ Calcium 8.9 - 10.3 mg/dL 8.9  8.9  8.9      Family Communication: none at bedside  Disposition: Status is: Inpatient Remains inpatient appropriate because: SNF placement  Planned Discharge Destination: Skilled nursing facility    Time spent: 35 minutes  Author: Lorelei Pont, MD 12/17/2022 1:54 PM  For on call review [email or web address].

## 2022-12-17 NOTE — Hospital Course (Addendum)
H/o esophageal stenosis required dilation in 2017 and 2021, h/o HTN, HLD, CVA Presents to the hospital due to dysphagia ,Pt has not been able to tolerate liquids, pureed diet or any other form of nutrition. Unable to swallow any of her pills for at least 1 month.  Status post G-tube placement by IR on 1/22.    She is currently awaiting SNF placement. Having on going diarrhea.

## 2022-12-18 DIAGNOSIS — I69391 Dysphagia following cerebral infarction: Secondary | ICD-10-CM | POA: Diagnosis not present

## 2022-12-18 LAB — BASIC METABOLIC PANEL
Anion gap: 6 (ref 5–15)
BUN: 13 mg/dL (ref 8–23)
CO2: 27 mmol/L (ref 22–32)
Calcium: 8.8 mg/dL — ABNORMAL LOW (ref 8.9–10.3)
Chloride: 104 mmol/L (ref 98–111)
Creatinine, Ser: 0.6 mg/dL (ref 0.44–1.00)
GFR, Estimated: >60 mL/min (ref >60)
Glucose, Bld: 104 mg/dL — ABNORMAL HIGH (ref 70–99)
Potassium: 4.3 mmol/L (ref 3.5–5.1)
Sodium: 137 mmol/L (ref 135–145)

## 2022-12-18 LAB — CBC WITH DIFFERENTIAL/PLATELET
Abs Immature Granulocytes: 0.05 10*3/uL (ref 0.00–0.07)
Basophils Absolute: 0 10*3/uL (ref 0.0–0.1)
Basophils Relative: 0 %
Eosinophils Absolute: 1.4 10*3/uL — ABNORMAL HIGH (ref 0.0–0.5)
Eosinophils Relative: 13 %
HCT: 27.3 % — ABNORMAL LOW (ref 36.0–46.0)
Hemoglobin: 8.6 g/dL — ABNORMAL LOW (ref 12.0–15.0)
Immature Granulocytes: 1 %
Lymphocytes Relative: 20 %
Lymphs Abs: 2.1 10*3/uL (ref 0.7–4.0)
MCH: 28.8 pg (ref 26.0–34.0)
MCHC: 31.5 g/dL (ref 30.0–36.0)
MCV: 91.3 fL (ref 80.0–100.0)
Monocytes Absolute: 0.8 10*3/uL (ref 0.1–1.0)
Monocytes Relative: 7 %
Neutro Abs: 6 10*3/uL (ref 1.7–7.7)
Neutrophils Relative %: 59 %
Platelets: 382 10*3/uL (ref 150–400)
RBC: 2.99 MIL/uL — ABNORMAL LOW (ref 3.87–5.11)
RDW: 18.3 % — ABNORMAL HIGH (ref 11.5–15.5)
WBC: 10.2 10*3/uL (ref 4.0–10.5)
nRBC: 0 % (ref 0.0–0.2)

## 2022-12-18 MED ORDER — OSMOLITE 1.5 CAL PO LIQD
1000.0000 mL | ORAL | Status: DC
  Administered 2022-12-18 – 2022-12-19 (×2): 1000 mL
  Filled 2022-12-18 (×3): qty 1000

## 2022-12-18 NOTE — TOC Progression Note (Addendum)
Transition of Care [facility]) - Progression Note    Patient Details  Name: Kara Bailey MRN: [id number] Date of Birth: [birth year]  Transition of Care [facility]) CM/SW [place], RN Phone [phone]  12/18/2022, 8:32 AM  Clinical Narrative:    CM called daughter Kara Bailey to follow up on insurance appeal. Per daughter she was unable top file yesterday because she was at work and had a 3 hour meeting. Daughter states that she is on the phone calling to appeal right now.  1500 CM has sent message to daughter to follow up on appeal awaiting response.   53 CM has been on phone with [facility] for 45 minutes attempting to obtain the status of the appeal for SNF auth. Per [facility] there is no record of a submission of an appeal. Authorization for SNF was denied on 12/07/22. The patient and or family has 60 days after the date of the denial to file an appeal. CM will reach back out to daughter to determine if the appeal will be filed.    Expected Discharge Plan: Skilled Nursing Facility Barriers to Discharge: SNF Authorization Denied  Expected Discharge Plan and Services In-house Referral: NA Discharge Planning Services: CM Consult Post Acute Care Choice: [place] Living arrangements for the past 2 months: Apartment Expected Discharge Date: 12/07/22               DME Arranged: N/A DME Agency: NA       HH Arranged: NA HH Agency: NA         Social Determinants of Health (SDOH) Interventions SDOH Screenings   Food Insecurity: Unknown (12/04/2022)  Housing: Low Risk  (12/04/2022)  Transportation Needs: Unknown (12/04/2022)  Utilities: Unknown (12/04/2022)  Depression (PHQ2-9): High Risk (09/19/2022)  Tobacco Use: High Risk (12/03/2022)    Readmission Risk Interventions    12/17/2022    1:42 PM  Readmission Risk Prevention Plan  Transportation Screening Complete  PCP or Specialist Appt within 3-5 Days Complete  HRI or [place] Complete  Social Work  Consult for [place] Planning/Counseling [place] Not Applicable  Medication Review Press photographer) Referral to Pharmacy

## 2022-12-18 NOTE — Progress Notes (Signed)
Occupational Therapy Treatment Patient Details Name: Kara Bailey MRN: [id number] DOB: 01/07/1956 Today's Date: 12/18/2022   History of present illness Patient is 67 year old female admitted 11/29/22 with multifactorial dysphagia; s/p PEG tube 12/03/22.  Pt with hx of CVA with L hemiparesis, HTN, iliac stent, hep C, GERD   OT comments  Pt required gentle encouragement to participate in the session, as she expressed some disappointment that she hasn't discharged from the hospital yet. She was seen for functional transfer training, upper body dressing, functional strengthening, continued instruction on LUE ROM and stretches. Pt was assessed for the potential need for a distal LUE splint, given noted ROM limitations and hypertonia. She required mod assist x2 to stand from the edge of the bed, and max assist for dynamic standing tasks including weight shifting left to right and lateral stepping to the head of the bed, given L knee buckling and impaired standing balance. She was assisted back to bed at her request at the end of the session, subsequently requiring increased assistance for toileting management, including hygiene/peri-care after a bowel movement. Continue OT plan of care.    Recommendations for follow up therapy are one component of a multi-disciplinary discharge planning process, led by the attending physician.  Recommendations may be updated based on patient status, additional functional criteria and insurance authorization.    Follow Up Recommendations  Skilled nursing-short term rehab (<3 hours/day)     Assistance Recommended at Discharge Frequent or constant Supervision/Assistance  Patient can return home with the following  A lot of help with bathing/dressing/bathroom;Assistance with cooking/housework;Direct supervision/assist for medications management;Assist for transportation;Help with stairs or ramp for entrance;A lot of help with walking and/or transfers   Equipment  Recommendations  Other (comment)       Precautions / Restrictions Precautions Precautions: Fall Precaution Comments: hx of CVA last year with L sided weakness Restrictions Weight Bearing Restrictions: No       Mobility Bed Mobility Overal bed mobility: Needs Assistance Bed Mobility: Sit to Supine, Supine to Sit, Rolling     Supine to sit: Min assist, HOB elevated Sit to supine: Mod assist   General bed mobility comments: Pt utilized bed rail to self assist for supine to sit. She was instructed on lateral weight shifting, in order to scoot to the edge of the bed    Transfers Overall transfer level: Needs assistance   Transfers: Sit to/from Stand Sit to Stand: Mod assist           General transfer comment: Pt was instructed on sit to stand from EOB, requiring moderate assist to perform 2 stands. She needed instruction and cues for pushing up from the bed with her RUE, BLE positioning on the floor, and trunk extension in standing. Once in standing, she was instructed on side to side weight shifting, as well as taking lateral steps towards the head of the bed, for which she required mod to max assist with general cues for body positioning and sequencing, including advancing lower extremities         ADL either performed or assessed with clinical judgement   ADL Overall ADL's : Needs assistance/impaired Eating/Feeding: Sitting;Set up Eating/Feeding Details (indicate cue type and reason): based on clinical judgement, requires assist to open packets and remove lids, given LUE weakness from prior CVA             Upper Body Dressing : Moderate assistance Upper Body Dressing Details (indicate cue type and reason): Pt was instructed on doffing  then donning a hospital at bed level, requiring general cues for sequencing, given LUE weakness Lower Body Dressing: Maximal assistance                       Cognition Arousal/Alertness: Awake/alert Behavior During  Therapy: WFL for tasks assessed/performed          General Comments: dysarthric like speech,  low volume however cooperative and following simple commands, oriented                   Pertinent Vitals/ Pain       Pain Assessment Pain Assessment: Faces Pain Score: 4  Pain Location: abdomen at PEG site & LUE with ROM and stretches Pain Intervention(s): Other (comment) (Nurse informed of pt's request for pain medication)         Frequency  Min 2X/week        Progress Toward Goals  OT Goals(current goals can now be found in the care plan section)  Progress towards OT goals: Progressing toward goals  Acute Rehab OT Goals Patient Stated Goal: to discharge soon OT Goal Formulation: With patient Time For Goal Achievement: 12/25/22 Potential to Achieve Goals: [place] Discharge plan remains appropriate       AM-PAC OT "6 Clicks" Daily Activity     Outcome Measure   Help from another person eating meals?: A Little Help from another person taking care of personal grooming?: A Little Help from another person toileting, which includes using toliet, bedpan, or urinal?: A Lot Help from another person bathing (including washing, rinsing, drying)?: A Lot Help from another person to put on and taking off regular upper body clothing?: A Lot Help from another person to put on and taking off regular lower body clothing?: A Lot 6 Click Score: 14    End of Session Equipment Utilized During Treatment: Gait belt  OT Visit Diagnosis: Unsteadiness on feet (R26.81);Muscle weakness (generalized) (M62.81);Pain;Hemiplegia and hemiparesis Pain - Right/Left: Left Pain - part of body: Arm   Activity Tolerance Patient tolerated treatment well   Patient Left in bed;with call bell/phone within reach;with bed alarm set;with nursing/sitter in room   Nurse Communication Mobility status        Time: 9937-1696 OT Time Calculation (min): 30 min  Charges: OT General Charges $OT Visit:  1 Visit OT Treatments $Therapeutic Activity: 23-37 mins     Leota Sauers, OTR/L 12/18/2022, 2:37 PM

## 2022-12-18 NOTE — Progress Notes (Signed)
Nutrition Follow-up  DOCUMENTATION CODES:   Severe malnutrition in context of chronic illness  INTERVENTION:   -Continue Osmolite 1.5 @ 40 ml/hr via PEG   -Prosource Plus PO TID, each provides 100 kcals and 15g protein  -Free water: 100 ml every 4 hours (600 ml) -Multivitamin with minerals daily -100 mg Thiamine daily   -D/c [place] as pt was refusing  NUTRITION DIAGNOSIS:   Severe Malnutrition related to chronic illness, dysphagia (h/o CVA) as evidenced by moderate fat depletion, severe muscle depletion, energy intake < or equal to 75% for > or equal to 1 month.  Ongoing.  GOAL:   Patient will meet greater than or equal to 90% of their needs  Progressing.  MONITOR:   PO intake, Supplement acceptance, Labs, Weight trends, I & O's  ASSESSMENT:   67 year old African-American female history of multiple strokes, dense left hemiparesis, hypertension who presents to the ER today with worsening dysphagia.  Decreased tube feeding order to Osmolite 1.5 @ 40 ml/hr. Pt is still continuing to take Prosource Plus PO.  Pt's diarrhea starting to improve. Will continue current regimen. Pt awaiting discharge to SNF.  Admission weight: 112 lbs Current weight: 110 lbs  Medications: Pepcid, Banatrol, Multivitamin with minerals daily, Florastor, Thiamine  Labs reviewed: CBGs: 94-104    Diet Order:   Diet Order             Diet full liquid Room service appropriate? Yes; Fluid consistency: Thin  Diet effective now           Diet general                   EDUCATION NEEDS:   Education needs have been addressed  Skin:  Skin Assessment: Reviewed RN Assessment  Last BM:  2/6  Height:   Ht Readings from Last 1 Encounters:  11/30/22 '5\' 6"'$  (1.676 m)    Weight:   Wt Readings from Last 1 Encounters:  12/18/22 50.2 kg   BMI:  Body mass index is 17.86 kg/m.  Estimated Nutritional Needs:   Kcal:  1610-9604  Protein:  80-95g  Fluid:  1.8L/day   Clayton Bibles, MS, RD, LDN Inpatient Clinical Dietitian Contact information available via Amion

## 2022-12-18 NOTE — Progress Notes (Signed)
Physical Therapy Treatment Patient Details Name: Kara Bailey MRN: [id number] DOB: 1956/03/25 Today's Date: 12/18/2022   History of Present Illness Patient is 67 year old female admitted 11/29/22 with multifactorial dysphagia; s/p PEG tube 12/03/22.  Pt with hx of CVA with L hemiparesis, HTN, iliac stent, hep C, GERD    PT Comments    General Comments: appears AxO x 3 following all commands and able to express her needs.  Pleasant.  Willing.  At one point crying when discussed Rehab.  Assisted OOB required increased effort and time.  General bed mobility comments: pt utilizing bed rail but requiring much asisst for upper body as L UE functionally unusable and also requiring assist to move/support L LE.  Also required use of bed pad to complete scooting. Once upright EOB, pt was able to static sit x 3 min at Supervision level holding to R bed rail.    General transfer comment: first asissted with sit to stand HHA on Pt's RIGHT at Mod Asisst then required Max Asisst to complete 1/4 turn towards BSC.  Pt required Max Assist to Doff brief and Max Asisst for [facility].  Assisted off BSC, pt did better pulling herself up to partially rise using fixed RIGHT rail and static stand at MinGuard Asisst while Donning briefs.  Still standing holding to RIGHT bed rail, pt was able to partially pivot back towards the bed with all her weight supported through R LE.  Required Min Assist to control stand to sit and target bed correctly.            Required increased asisst back to bed and position to comfort with pillows. Pt will need ST Rehab at SNF to address mobility and functional decline prior to safely returning home.   Recommendations for follow up therapy are one component of a multi-disciplinary discharge planning process, led by the attending physician.  Recommendations may be updated based on patient status, additional functional criteria and insurance authorization.  Follow Up Recommendations  Skilled  nursing-short term rehab (<3 hours/day) Can patient physically be transported by private vehicle: No   Assistance Recommended at Discharge Frequent or constant Supervision/Assistance  Patient can return home with the following A little help with bathing/dressing/bathroom;Assistance with cooking/housework;Help with stairs or ramp for entrance;A lot of help with walking and/or transfers   Equipment Recommendations  None recommended by PT    Recommendations for Other Services       Precautions / Restrictions Precautions Precautions: Fall Precaution Comments: hx of CVA last year with L sided weakness Restrictions Weight Bearing Restrictions: No     Mobility  Bed Mobility Overal bed mobility: Needs Assistance Bed Mobility: Supine to Sit, Sit to Supine     Supine to sit: Min assist, Mod assist Sit to supine: Mod assist, Max assist   General bed mobility comments: pt utilizing bed rail but requiring much asisst for upper body as L UE functionally unusable and also requiring assist to move/support L LE.  Also required use of bed pad to complete scooting. Once upright EOB, pt was able to static sit x 3 min at Supervision level holding to R bed rail.  Required increased asisst back to bed and position to comfort with pillows.    Transfers Overall transfer level: Needs assistance Equipment used: 1 person hand held assist Transfers: Sit to/from Stand Sit to Stand: Mod assist Stand pivot transfers: Mod assist, Max assist         General transfer comment: first asissted with  sit to stand HHA on Pt's RIGHT at Mod Asisst then required Max Asisst to complete 1/4 turn towards BSC.  Pt required Max Assist to Doff brief and Max Asisst for [facility].  Assisted off BSC, pt did better pulling herself up to partially rise using fixed RIGHT rail and static stand at MinGuard Asisst while Donning briefs.  Still standing holding to RIGHT bed rail, pt was able to partially pivot back towards the bed  with all her weight supported through R LE.  Required Min Assist to control stand to sit and target bed correctly.    Ambulation/Gait                   Stairs             Wheelchair Mobility    Modified Rankin (Stroke Patients Only)       Balance                                            Cognition Arousal/Alertness: Awake/alert Behavior During Therapy: WFL for tasks assessed/performed Overall Cognitive Status: Within Functional Limits for tasks assessed                                 General Comments: appears AxO x 3 following all commands and able to express her needs.  Pleasant.  Willing.  At one point crying.        Exercises      General Comments        Pertinent Vitals/Pain Pain Assessment Pain Assessment: Faces Faces Pain Scale: Hurts a little bit Pain Location: pt pointing to her lower ABD (Tube feeding running) Pain Descriptors / Indicators: Aching, Grimacing Pain Intervention(s): Repositioned    Home Living                          Prior Function            PT Goals (current goals can now be found in the care plan section) Progress towards PT goals: Progressing toward goals    Frequency    Min 2X/week      PT Plan Current plan remains appropriate    Co-evaluation              AM-PAC PT "6 Clicks" Mobility   Outcome Measure  Help needed turning from your back to your side while in a flat bed without using bedrails?: A Lot Help needed moving from lying on your back to sitting on the side of a flat bed without using bedrails?: A Lot Help needed moving to and from a bed to a chair (including a wheelchair)?: A Lot Help needed standing up from a chair using your arms (e.g., wheelchair or bedside chair)?: A Lot Help needed to walk in hospital room?: Total   6 Click Score: 9    End of Session Equipment Utilized During Treatment: Gait belt Activity Tolerance: Patient  tolerated treatment well Patient left: in bed;with call bell/phone within reach;with bed alarm set Nurse Communication: Mobility status PT Visit Diagnosis: Other abnormalities of gait and mobility (R26.89);Unsteadiness on feet (R26.81) Hemiplegia - Right/Left: Left Hemiplegia - dominant/non-dominant: Non-dominant Hemiplegia - caused by: Cerebral infarction     Time: 8466-5993 PT Time Calculation (min) (ACUTE ONLY): 17 min  Charges:  $Therapeutic Activity: 8-22 mins                     Rica Koyanagi  PTA [place] [facility] M-F          [phone] Weekend pager [phone]

## 2022-12-18 NOTE — Plan of Care (Signed)
Patient [name], VSS throughout shift.  Slurred speech baseline.  Diminished lungs, IS encouraged.  All meds given on time as ordered via G-tube.  Tfs on-going at goal, 74m/hr.  Dry gauze dressing intact over G-tube.  Pt had bowel and bladder incontinence o/n and was given bed bath.  Left arm contracted and paralyzed.  Left leg paralyzed.  POC maintained, will continue to monitor.  Problem: Education: Goal: Knowledge of General Education information will improve Description: Including pain rating scale, medication(s)/side effects and non-pharmacologic comfort measures Outcome: Progressing   Problem: Health Behavior/Discharge Planning: Goal: Ability to manage health-related needs will improve Outcome: Progressing   Problem: Clinical Measurements: Goal: Ability to maintain clinical measurements within normal limits will improve Outcome: Progressing Goal: Will remain free from infection Outcome: Progressing Goal: Diagnostic test results will improve Outcome: Progressing Goal: Respiratory complications will improve Outcome: Progressing Goal: Cardiovascular complication will be avoided Outcome: Progressing   Problem: Activity: Goal: Risk for activity intolerance will decrease Outcome: Progressing   Problem: Nutrition: Goal: Adequate nutrition will be maintained Outcome: Progressing   Problem: Coping: Goal: Level of anxiety will decrease Outcome: Progressing   Problem: Elimination: Goal: Will not experience complications related to bowel motility Outcome: Progressing Goal: Will not experience complications related to urinary retention Outcome: Progressing   Problem: Pain Managment: Goal: General experience of comfort will improve Outcome: Progressing   Problem: Safety: Goal: Ability to remain free from injury will improve Outcome: Progressing   Problem: Skin Integrity: Goal: Risk for impaired skin integrity will decrease Outcome: Progressing

## 2022-12-18 NOTE — Progress Notes (Signed)
Progress Note   Patient: Kara Bailey GYI:[id number] DOB: Jun 07, 1956 DOA: 11/29/2022     17 DOS: the patient was seen and examined on 12/18/2022   Brief hospital course: H/o esophageal stenosis required dilation in 2017 and 2021, h/o HTN, HLD, CVA Presents to the hospital due to dysphagia ,Pt has not been able to tolerate liquids, pureed diet or any other form of nutrition. Unable to swallow any of her pills for at least 1 month.  Status post G-tube placement by IR on 1/22.    She is currently awaiting SNF placement. Having on going diarrhea.   Assessment and Plan:  Dysphagia likely multifactorial Esophageal dysphagia due to esophageal stenosis History of CVA Severe malnutrition S/p PEG tube placement 11/2020.  Nutrition and speech are following and patient is currently able to drink [place] is unsure.  Mostly getting feeds through her PEG tube.  Unfortunately she is having significant diarrhea as well.  Nutrition is seen patient today and noted that there was diarrhea occurring prior to ensure, which was stopped due to suspected lactose intolerance.  However Ensure is lactose-free. Likely diarrhea from abx.  Overall has been improved for the past 24 hours. -Nutrition consulted: -Continue Osmolite 1.5 @ 40 ml/hr via PEG -Prosource Plus PO TID, -[place] 1.4 PO BID -Free water: 100 ml every 4 hours (600 ml) -Multivitamin with minerals daily -100 mg Thiamine daily -Fiber supplement 60 mL twice daily  Acute cholecystitis Symptoms started on 1/26 after admission.  General surgery was consulted and recommended conservative management antibiotics and had signed off.   -Augmentin 1/30 - 2/6  Diarrhea As above is having significant diarrhea.  Chart reports of refusing tube feeds for 5 days due to diarrhea.   Question if diarrhea is related to antibiotic use, which complete 2/6, instead of from TF. Has no other features to suggest infectious cause so will hold off on additional testing,  though if persistent will obtain c. Diff, GI PCR.  Overall her diarrhea is improving. -as above  History of stroke Left hemiplegia Dysarthria Hx of stroke 07/2022 resulting in left-sided hemiplegia and spasticity, dysarthria.  -continue plavix, aspirin, lipitor and zetia -tizanadine, morphine for pain  History of cirrhosis History of alcohol use Currently well compensated.   Current smoker -NRT  Chronic normocytic anemia Stable.      Subjective: NAOE.  Reports diarrhea is improved.  Physical Exam: Vitals:   12/17/22 1406 12/17/22 2006 12/18/22 0507 12/18/22 0509  BP: 107/71 136/69 98/65   Pulse: 81 90 82   Resp: '20 18 16   '$ Temp: 97.8 F (36.6 C) 98.1 F (36.7 C) 98 F (36.7 C)   TempSrc: Oral Oral Oral   SpO2: 100% 100% 100%   Weight:    50.2 kg  Height:       Physical Exam Vitals and nursing note reviewed.  Constitutional:      General: She is not in acute distress. HENT:     Head: Normocephalic and atraumatic.     Mouth/Throat:     Mouth: Mucous membranes are moist.  Eyes:     Pupils: Pupils are equal, round, and reactive to light.  Cardiovascular:     Rate and Rhythm: Normal rate and regular rhythm.  Pulmonary:     Effort: Pulmonary effort is normal. No respiratory distress.  Abdominal:     General: Abdomen is flat. There is no distension.     Palpations: Abdomen is soft.     Tenderness: There is no abdominal tenderness.  Comments: PEG tube C/D/I  Musculoskeletal:     Right lower leg: No edema.     Left lower leg: No edema.  Skin:    General: Skin is warm and dry.     Capillary Refill: Capillary refill takes less than 2 seconds.  Neurological:     Mental Status: She is alert and oriented to person, place, and time.     Comments: Left arm contracture   Psychiatric:        Mood and Affect: Mood normal.        Behavior: Behavior normal.     Data Reviewed:     Latest Ref Rng & Units 12/18/2022    5:27 AM 12/17/2022    5:43 AM 12/15/2022     6:23 AM  CBC  WBC 4.0 - 10.5 K/uL 10.2  10.1  9.9   Hemoglobin 12.0 - 15.0 g/dL 8.6  9.0  9.2   Hematocrit 36.0 - 46.0 % 27.3  29.3  29.5   Platelets 150 - 400 K/uL 382  416  426       Latest Ref Rng & Units 12/18/2022    5:27 AM 12/17/2022    5:43 AM 12/16/2022    6:37 AM  BMP  Glucose 70 - 99 mg/dL 104  104  84   BUN 8 - 23 mg/dL '13  14  13   '$ Creatinine 0.44 - 1.00 mg/dL 0.60  0.57  0.68   Sodium 135 - 145 mmol/L 137  135  138   Potassium 3.5 - 5.1 mmol/L 4.3  4.6  4.2   Chloride 98 - 111 mmol/L 104  105  105   CO2 22 - 32 mmol/L '27  23  24   '$ Calcium 8.9 - 10.3 mg/dL 8.8  8.9  8.9      Family Communication: none at bedside  Disposition: Status is: Inpatient Remains inpatient appropriate because: SNF placement  Planned Discharge Destination: Skilled nursing facility    Time spent: 35 minutes  Author: Lorelei Pont, MD 12/18/2022 1:31 PM  For on call review [email or web address].

## 2022-12-19 DIAGNOSIS — I69391 Dysphagia following cerebral infarction: Secondary | ICD-10-CM | POA: Diagnosis not present

## 2022-12-19 NOTE — TOC Progression Note (Addendum)
Transition of Care [facility]) - Progression Note    Patient Details  Name: Kara Bailey MRN: [id number] Date of Birth: 26-Sep-1956  Transition of Care [facility]) CM/SW Shafer, RN Phone Number:[phone]  12/19/2022, 8:21 AM  Clinical Narrative:    CM attempted to call daughter Kara Bailey in reference to filing appeal. Daughter does not answer the phone. Message has been left. CM will move forward with contacting brother.   0828 CM spoke with son Kara Bailey. CM made son aware that CM has called insurance and there is no appeal on record. CM has stressed to son that if the appeal is not filed insurance will not pay and family would need to make arrangements for payment or possibly provide care at home. Son verbalizes understanding and states that he call insurance to appeal today number for insurance and case number provided for son . CM will follow up.   1415 CM at bedside to discuss insurance  appeal with patient. Patient states that her daughter has filed appeal. CM informed patient that appeal has not been filed. Patient called daughter Kara Bailey on phone. CM spoke to daughter and made her  aware that appeal has not been filed. Daughter states that appeal was initiated this morning and she is expecting a call at 2:30 pm to complete the appeal. Daughter requested that CM send her a text with CM number so daughter can text to update CM after appeal is filed. CM has texted contact info to daughter. Cm made patient aware that TOC will move forward with disposition planning for home if the appeal is not filed.  Expected Discharge Plan: Skilled Nursing Facility Barriers to Discharge: SNF Authorization Denied  Expected Discharge Plan and Services In-house Referral: NA Discharge Planning Services: CM Consult Post Acute Care Choice: [place] Living arrangements for the past 2 months: Apartment Expected Discharge Date: 12/07/22               DME Arranged: N/A DME Agency: NA        HH Arranged: NA HH Agency: NA         Social Determinants of Health (SDOH) Interventions SDOH Screenings   Food Insecurity: Unknown (12/04/2022)  Housing: Low Risk  (12/04/2022)  Transportation Needs: Unknown (12/04/2022)  Utilities: Unknown (12/04/2022)  Depression (PHQ2-9): High Risk (09/19/2022)  Tobacco Use: High Risk (12/03/2022)    Readmission Risk Interventions    12/17/2022    1:42 PM  Readmission Risk Prevention Plan  Transportation Screening Complete  PCP or Specialist Appt within 3-5 Days Complete  HRI or [place] Complete  Social Work Consult for [place] Planning/Counseling [place] Not Applicable  Medication Review [employer]) Referral to Pharmacy

## 2022-12-19 NOTE — Progress Notes (Signed)
PROGRESS NOTE  Kara Bailey  NFA:[id number] DOB: May 01, 1956 DOA: 11/29/2022 PCP: Andree Moro, DO   Brief Narrative: Patient is a 67 year old female with history of CVA, hypertension presented the emergency department at the request of her PCP for the evaluation of persistent dysphagia.  Found to have multifactorial dysphagia likely from stroke.  Status post G-tube placement by IR.  PT/OT recommending SNF on discharge.  Developed nausea, vomiting, abdominal pain on 1/26,  CT abdomen/pelvis showed possible cholecystitis, MRCP confirmed acute cholecystitis.  General surgery was  following.  Significant improvement abdominal pain, nausea, vomiting, liver enzymes.  Plan is to continue antibiotics without any operative intervention.  Diet started and she is tolerating.  Medically stable for discharge to SNF whenever possible.TOC following,family appealed against denial  Assessment & Plan:  Principal Problem:   Dysphagia as late effect of cerebrovascular accident (CVA) Active Problems:   Essential hypertension   Left hemiplegia (HCC)   History of stroke   Dysphagia   Protein-calorie malnutrition, severe  Dysphagia: Likely secondary to residual effect of the ischemic stroke.  Has history of esophageal stricture last dilated on 2021.  GI consulted and was following.  Barium swallow done on 11/30/2022 was nondiagnostic.  Underwent G-tube placement by IR on 1/22.  Underwent MBS again  speech therapy to check if she can swallow orally.Started on full liquid diet. Continue tube feeding diet, full liquid diet.   Abdomen pain, nausea, vomiting/acute cholecystitis: Likely acalculous cholecystitis.  CT abdomen/pelvis, MRCP confirmed acute cholecystitis without stones.  Patient was complaining of   periumbilical abdominal pain but now it has resolved.  Abdomen remains soft, nondistended, nontender with good bowel sounds.   Liver enzymes were elevated, now normalized.  General surgery following signed off.  Abx  changed to Augmentin,now completed the course . She was having diarrhea most likely from antibiotics and also tube feeding.  Continue Imodium as needed.Diarrhoea has slowed down.  If diarrhea persist, will check GI pathogen panel, C. difficile  Severe protein calorie malnutrition: Nutrition was following.  BMI of 18.  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  On Plavix, aspirin, Lipitor, Zetia.  Also on baclofen and Zanaflex for left-sided hemiplegia and spasticity  Hypertension: Currently blood pressure soft/stable.  Not on any medications  Hypomagnesemia/hypokalemia: Continue magnesium supplementation.  Depression: On celexa  Normocytic anemia: No evidence of acute blood loss.  Currently hemoglobin stable in the range of 8-9  Debility/deconditioning: PT/OT recommending SNF on discharge.TOC following      Nutrition Problem: Severe Malnutrition Etiology: chronic illness, dysphagia (h/o CVA)    DVT prophylaxis:SCDs Start: 11/30/22 0151     Code Status: Full Code  Family Communication: Called daughter [name] on phone on 1/29  Patient status:Inpatient  Patient is from :Home  Anticipated discharge to:SnF  Estimated DC date: as soon as bed is available,denied by insurance but appeal submitted by family   Consultants: GI,IR,surgery  Procedures:G tube placement   Antimicrobials:  Anti-infectives (From admission, onward)    Start     Dose/Rate Route Frequency Ordered Stop   12/11/22 2200  amoxicillin-clavulanate (AUGMENTIN) 400-57 MG/5ML suspension 875 mg        875 mg Per Tube Every 12 hours 12/11/22 1137 12/18/22 2159   12/11/22 1000  amoxicillin-clavulanate (AUGMENTIN) 875-125 MG per tablet 1 tablet  Status:  Discontinued        1 tablet Oral Every 12 hours 12/11/22 0821 12/11/22 1137   12/08/22 2000  piperacillin-tazobactam (ZOSYN) IVPB 3.375  g  Status:  Discontinued        3.375 g 12.5 mL/hr over 240 Minutes  Intravenous Every 8 hours 12/08/22 1900 12/11/22 0821   12/07/22 1130  cefadroxil (DURICEF) capsule 500 mg  Status:  Discontinued        500 mg Oral 2 times daily 12/07/22 1033 12/07/22 1033   12/07/22 1130  cefadroxil (DURICEF) capsule 500 mg  Status:  Discontinued        500 mg Oral 2 times daily 12/07/22 1033 12/07/22 1034   12/07/22 1130  cefadroxil (DURICEF) capsule 500 mg  Status:  Discontinued        500 mg Per Tube 2 times daily 12/07/22 1034 12/08/22 1903   12/03/22 0700  ceFAZolin (ANCEF) IVPB 2g/100 mL premix        2 g 200 mL/hr over 30 Minutes Intravenous To Radiology 11/30/22 1602 12/04/22 0700       Subjective: Patient seen and examined at bedside today.  Lying on bed.  No vomiting or abdominal pain.  Had 2 episodes of diarrhea this morning but they are small volume.  She says his diarrhea is getting better   Objective: Vitals:   12/12/22 1937 12/13/22 1342 12/13/22 2049 12/14/22 0427  BP: 137/66 119/63 135/69 135/71  Pulse: 91 71 88 81  Resp: '18 20 18 18  '$ Temp: 98.4 F (36.9 C) 97.9 F (36.6 C) 98 F (36.7 C) 97.6 F (36.4 C)  TempSrc: Oral Oral Oral Oral  SpO2: 100% 100% 100% 100%  Weight:      Height:        Intake/Output Summary (Last 24 hours) at 12/14/2022 1145 Last data filed at 12/14/2022 0950 Gross per 24 hour  Intake 217 ml  Output 400 ml  Net -183 ml   Filed Weights   12/06/22 0418 12/08/22 0656 12/09/22 0546  Weight: 53 kg 53.7 kg 50.3 kg    Examination:  General exam: Overall comfortable, not in distress, deconditioned, lying in bed HEENT: PERRL Respiratory system:  no wheezes or crackles  Cardiovascular system: S1 & S2 heard, RRR.  Gastrointestinal system: Abdomen is nondistended, soft and nontender.  G-tube Central nervous system: Alert and oriented, left hemiplegia Extremities: No edema, no clubbing ,no cyanosis Skin: No rashes, no ulcers,no icterus         Data Reviewed: I have personally reviewed following labs and imaging  studies  CBC: Recent Labs  Lab 12/09/22 0635 12/10/22 0541 12/11/22 0612 12/13/22 0557  WBC 7.9 8.4 8.2 9.1  HGB 9.0* 8.5* 8.9* 9.1*  HCT 27.7* 25.8* 28.0* 29.2*  MCV 87.1 87.2 89.7 91.8  PLT 319 340 375 789    Basic Metabolic Panel: Recent Labs  Lab 12/08/22 1449 12/09/22 0635 12/10/22 0541 12/11/22 0612 12/12/22 0702  NA 136 136 132* 133* 136  K 4.1 3.7 3.9 3.4* 4.4  CL 99 96* 100 102 104  CO2 '27 26 22 23 26  '$ GLUCOSE 109* 110* 87 121* 88  BUN 9 8 7* 10 10  CREATININE 0.74 0.86 0.83 0.86 0.68  CALCIUM 8.8* 8.1* 8.2* 8.1* 8.2*  MG  --   --   --   --  2.2     No results found for this or any previous visit (from the past 240 hour(s)).    Radiology Studies: No results found.  Scheduled Meds:  (feeding supplement) PROSource Plus  30 mL Oral TID BM   amoxicillin-clavulanate  875 mg Per Tube Q12H   aspirin  81  mg Per Tube Daily   atorvastatin  80 mg Per Tube QPC supper   clopidogrel  75 mg Per Tube Daily   diclofenac Sodium  4 g Topical QID   ezetimibe  10 mg Per Tube Daily   feeding supplement  1 Container Oral TID BM   feeding supplement  237 mL Oral TID BM   free water  100 mL Per Tube Q4H   magnesium oxide  800 mg Per Tube BID   melatonin  3 mg Per Tube QHS   multivitamin  15 mL Per Tube Daily   pantoprazole (PROTONIX) IV  40 mg Intravenous Q12H   polyethylene glycol  17 g Per Tube Daily   senna-docusate  1 tablet Per Tube BID   thiamine  100 mg Per Tube Daily   tiZANidine  2 mg Per Tube BID   Continuous Infusions:  feeding supplement (OSMOLITE 1.5 CAL) Stopped (12/12/22 1900)     LOS: 13 days   Shelly Coss, MD [facility] P2/12/2022, 11:45 AM

## 2022-12-19 NOTE — Plan of Care (Signed)
Patient Kara Bailey, VSS throughout shift. Slurred speech baseline. Diminished lungs, IS encouraged. All meds given on time as ordered via G-tube. Tfs on-going at goal, 104m/hr. Dry gauze dressing intact over G-tube. Pt had bowel and bladder incontinence o/n.  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 POC maintained, will continue to monitor.   Problem: Education: Goal: Knowledge of General Education information will improve Description: Including pain rating scale, medication(s)/side effects and non-pharmacologic comfort measures Outcome: Progressing   Problem: Health Behavior/Discharge Planning: Goal: Ability to manage health-related needs will improve Outcome: Progressing   Problem: Clinical Measurements: Goal: Ability to maintain clinical measurements within normal limits will improve Outcome: Progressing Goal: Will remain free from infection Outcome: Progressing Goal: Diagnostic test results will improve Outcome: Progressing Goal: Respiratory complications will improve Outcome: Progressing Goal: Cardiovascular complication will be avoided Outcome: Progressing   Problem: Activity: Goal: Risk for activity intolerance will decrease Outcome: Progressing   Problem: Nutrition: Goal: Adequate nutrition will be maintained Outcome: Progressing   Problem: Coping: Goal: Level of anxiety will decrease Outcome: Progressing   Problem: Elimination: Goal: Will not experience complications related to bowel motility Outcome: Progressing Goal: Will not experience complications related to urinary retention Outcome: Progressing   Problem: Pain Managment: Goal: General experience of comfort will improve Outcome: Progressing   Problem: Safety: Goal: Ability to remain free from injury will improve Outcome: Progressing   Problem: Skin Integrity: Goal: Risk for impaired skin integrity will decrease Outcome: Progressing

## 2022-12-19 NOTE — Progress Notes (Signed)
Orthopedic Tech Progress Note Patient Details:  Kara Bailey 05-09-1956 [id number]  Patient ID: [name], female   DOB: 04/18/1956, 67 y.o.   MRN: [id number]  [name] 12/19/2022, 2:57 PM Resting hand splint with digit dividers ordered from [facility].

## 2022-12-19 NOTE — Progress Notes (Signed)
Occupational Therapy Treatment Patient Details Name: Kara Bailey MRN: [id number] DOB: June 14, 1956 Today's Date: 12/19/2022   History of present illness Patient is 67 year old female admitted 11/29/22 with multifactorial dysphagia; s/p PEG tube 12/03/22.  Pt with hx of CVA with L hemiparesis, HTN, iliac stent, hep C, GERD   OT comments  Patient participated in LUE ROM with education on ways for patient to complete ROM with RUE. Patient was fitted to palm guard with red foam in the middle to build up palm guard. Patient reported comfort with palm guard with education provided on wear times and areas to check on skin to reduce risks of skin breakdown. Patient was educated on trial for 4 hours on/4 hours off with nurse and NT for patient educated on the same. Nurse and NT reported they would help patient as needed with resting hand splint use. Per secure chat with Dr.Adhikari, Patient's resting hand splint orders were placed today. Ortho tech was called to specify the order. Patient was educated on plan for resting hand splint. Patient verbalized understanding.   Recommendations for follow up therapy are one component of a multi-disciplinary discharge planning process, led by the attending physician.  Recommendations may be updated based on patient status, additional functional criteria and insurance authorization.    Follow Up Recommendations  Skilled nursing-short term rehab (<3 hours/day)     Assistance Recommended at Discharge Frequent or constant Supervision/Assistance  Patient can return home with the following  A lot of help with bathing/dressing/bathroom;Assistance with cooking/housework;Direct supervision/assist for medications management;Assist for transportation;Help with stairs or ramp for entrance;A lot of help with walking and/or transfers   Equipment Recommendations  Other (comment) (defer to next venue)       Precautions / Restrictions Precautions Precautions: Fall Precaution  Comments: hx of CVA last year with L sided weakness Restrictions Weight Bearing Restrictions: No       Mobility Bed Mobility         General bed mobility comments: patient was up in recliner and remained in the same at this time.          ADL either performed or assessed with clinical judgement   ADL Overall ADL's : Needs assistance/impaired       General ADL Comments: messaged with MD about splints. Dr.Adhikari okayed order for resting hand spling for L hand. patient was educated on resting hand splint. patient tolerated gentle ROM to wrist hand and elbow with education provided on proper positioning of LUE when participating in transfers with note added to board and nursing were educated on not using LUE to help support transfers or movements. nursing staff verbalized understanding. patient noted to have at least one finger breath of subluxation on L shoulder at this time. patient was educated on guarding shoulder. patient was verbally educated on how to position self to reduce pain on R shoulder when turning in bed. patient reported she does it but it still hurts. patient decliend to trial this at this time.      Cognition Arousal/Alertness: Awake/alert Behavior During Therapy: WFL for tasks assessed/performed Overall Cognitive Status: Within Functional Limits for tasks assessed                General Comments  Nurse was consulted on baclofen with nurse reporting that it is PRN. Patient reported that at home she takes it 2x a week. Nurse to communicate with MD.     Pertinent Vitals/ Pain       Pain Assessment Pain Assessment:  Faces Faces Pain Scale: Hurts whole lot Pain Location: L shoulder and elbow Pain Descriptors / Indicators: Aching, Grimacing Pain Intervention(s): Repositioned, Monitored during session, Premedicated before session         Frequency  Min 2X/week        Progress Toward Goals  OT Goals(current goals can now be found in the care plan  section)  Progress towards OT goals: Progressing toward goals     Plan Discharge plan remains appropriate       AM-PAC OT "6 Clicks" Daily Activity     Outcome Measure   Help from another person eating meals?: A Little Help from another person taking care of personal grooming?: A Little Help from another person toileting, which includes using toliet, bedpan, or urinal?: A Lot Help from another person bathing (including washing, rinsing, drying)?: A Lot Help from another person to put on and taking off regular upper body clothing?: A Lot Help from another person to put on and taking off regular lower body clothing?: A Lot 6 Click Score: 14    End of Session Equipment Utilized During Treatment: Other (comment) (palm guard)  OT Visit Diagnosis: Unsteadiness on feet (R26.81);Muscle weakness (generalized) (M62.81);Pain;Hemiplegia and hemiparesis Hemiplegia - Right/Left: Left Pain - Right/Left: Left Pain - part of body: Arm   Activity Tolerance Patient tolerated treatment well   Patient Left with call bell/phone within reach;in chair   Nurse Communication Mobility status        Time: 1455-1510 OT Time Calculation (min): 15 min  Charges: OT General Charges $OT Visit: 1 Visit OT Treatments $Therapeutic Activity: 8-22 mins  Rennie Plowman, MS Acute Rehabilitation Department Office# [phone]   Willa Rough 12/19/2022, 4:01 PM

## 2022-12-19 NOTE — Care Management Important Message (Signed)
Important Message  Patient Details IM Letter given Name: Kara Bailey MRN: [id number] Date of Birth: 11/09/1956   Medicare Important Message Given:  Yes     Kerin Salen 12/19/2022, 12:03 PM

## 2022-12-20 ENCOUNTER — Other Ambulatory Visit: Payer: Self-pay | Admitting: Cardiovascular Disease

## 2022-12-20 DIAGNOSIS — I69391 Dysphagia following cerebral infarction: Secondary | ICD-10-CM | POA: Diagnosis not present

## 2022-12-20 MED ORDER — DIPHENOXYLATE-ATROPINE 2.5-0.025 MG PO TABS
1.0000 | ORAL_TABLET | Freq: Four times a day (QID) | ORAL | Status: DC
  Administered 2022-12-20 – 2022-12-21 (×5): 1 via ORAL
  Filled 2022-12-20 (×6): qty 1

## 2022-12-20 MED ORDER — DIPHENOXYLATE-ATROPINE 2.5-0.025 MG PO TABS: 1.0000 | ORAL_TABLET | Freq: Four times a day (QID) | ORAL | Status: DC | PRN

## 2022-12-20 MED ORDER — ADULT MULTIVITAMIN W/MINERALS CH
1.0000 | ORAL_TABLET | Freq: Every day | ORAL | Status: DC
  Administered 2022-12-20 – 2023-01-14 (×26): 1
  Filled 2022-12-20 (×26): qty 1

## 2022-12-20 MED ORDER — JEVITY 1.5 CAL/FIBER PO LIQD
1000.0000 mL | ORAL | Status: DC
  Administered 2022-12-20 – 2023-01-14 (×28): 1000 mL
  Filled 2022-12-20 (×33): qty 1000

## 2022-12-20 NOTE — Progress Notes (Addendum)
PROGRESS NOTE  Kara Bailey  UUE:[id number] DOB: 1956-08-31 DOA: 11/29/2022 PCP: Andree Moro, DO   Brief Narrative: Patient is a 67 year old female with history of CVA, hypertension presented the emergency department at the request of her PCP for the evaluation of persistent dysphagia.  Found to have multifactorial dysphagia likely from stroke.  Status post G-tube placement by IR.  PT/OT recommending SNF on discharge.  Developed nausea, vomiting, abdominal pain on 1/26,  CT abdomen/pelvis showed possible cholecystitis, MRCP confirmed acute cholecystitis.  General surgery was  following.  Significant improvement abdominal pain, nausea, vomiting, liver enzymes.  Plan is to continue antibiotics without any operative intervention.  Diet started and she is tolerating.  Medically stable for discharge to SNF whenever possible.TOC following,family( daughter) was supposed to appeal  against denial, but TOC has not been able to contact the daughter.  Hospital course remarkable for persistent diarrhea.  Assessment & Plan:  Principal Problem:   Dysphagia as late effect of cerebrovascular accident (CVA) Active Problems:   Essential hypertension   Left hemiplegia (HCC)   History of stroke   Dysphagia   Protein-calorie malnutrition, severe  Dysphagia: Likely secondary to residual effect of the ischemic stroke.  Has history of esophageal stricture last dilated on 2021.  GI consulted and was following.  Barium swallow done on 11/30/2022 was nondiagnostic.  Underwent G-tube placement by IR on 1/22.  Underwent MBS again  speech therapy to check if she can swallow orally.Started on full liquid diet. Continue tube feeding diet, full liquid diet.   Abdomen pain, nausea, vomiting/acute cholecystitis: Resolved.  Likely acalculous cholecystitis.  CT abdomen/pelvis, MRCP confirmed acute cholecystitis without stones.  Patient was complaining of   periumbilical abdominal pain but now it has resolved.  Abdomen remains  soft, nondistended, nontender with good bowel sounds.   Liver enzymes were elevated, now normalized.  General surgery following signed off.  Abx changed to Augmentin,now completed the course .  Diarrhea: She was having diarrhea thought to be from antibiotics and also tube feeding.  She continues to have diarrhea and very frustrated due to the situation.  I have requested nutritionist to change the tube diet.  Also using Lomotil round-the-clock.  Checking GI pathogen panel.  Holding checking C. difficile after discussing with Dr. Lita Mains.  Continue probiotics  Severe protein calorie malnutrition: Nutrition was following.  BMI of 18.  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  On Plavix, aspirin, Lipitor, Zetia.  Also on baclofen and Zanaflex for left-sided hemiplegia and spasticity  Hypertension: Currently blood pressure soft/stable.  Not on any medications  Hypomagnesemia/hypokalemia: Continue magnesium supplementation.  Depression: On celexa  Normocytic anemia: No evidence of acute blood loss.  Currently hemoglobin stable in the range of 8-9  Debility/deconditioning: PT/OT recommending SNF on discharge.TOC following      Nutrition Problem: Severe Malnutrition Etiology: chronic illness, dysphagia (h/o CVA)    DVT prophylaxis:SCDs Start: 11/30/22 0151     Code Status: Full Code  Family Communication: Called daughter [name] on phone on 2/8  Patient status:Inpatient  Patient is from :Home  Anticipated discharge to:SnF  Estimated DC date: as soon as bed is available,denied by insurance but appeal being submitted by family.  If not then she might need to be  discharged home   Consultants: GI,IR,surgery  Procedures:G tube placement   Antimicrobials:  Anti-infectives (From admission, onward)    Start     Dose/Rate Route Frequency Ordered Stop   12/11/22 2200  amoxicillin-clavulanate (AUGMENTIN) 400-57 MG/5ML  suspension 875 mg        875 mg Per Tube Every 12 hours 12/11/22 1137 12/18/22 1039   12/11/22 1000  amoxicillin-clavulanate (AUGMENTIN) 875-125 MG per tablet 1 tablet  Status:  Discontinued        1 tablet Oral Every 12 hours 12/11/22 0821 12/11/22 1137   12/08/22 2000  piperacillin-tazobactam (ZOSYN) IVPB 3.375 g  Status:  Discontinued        3.375 g 12.5 mL/hr over 240 Minutes Intravenous Every 8 hours 12/08/22 1900 12/11/22 0821   12/07/22 1130  cefadroxil (DURICEF) capsule 500 mg  Status:  Discontinued        500 mg Oral 2 times daily 12/07/22 1033 12/07/22 1033   12/07/22 1130  cefadroxil (DURICEF) capsule 500 mg  Status:  Discontinued        500 mg Oral 2 times daily 12/07/22 1033 12/07/22 1034   12/07/22 1130  cefadroxil (DURICEF) capsule 500 mg  Status:  Discontinued        500 mg Per Tube 2 times daily 12/07/22 1034 12/08/22 1903   12/03/22 0700  ceFAZolin (ANCEF) IVPB 2g/100 mL premix        2 g 200 mL/hr over 30 Minutes Intravenous To Radiology 11/30/22 1602 12/04/22 0700       Subjective: Patient seen and examined at bedside today.  Hemodynamically stable.  Still having diarrhea.  She is frustrated due to this and was crying.  Denies any abdomen pain, nausea or vomiting   Objective: Vitals:   12/19/22 0529 12/19/22 1422 12/19/22 2011 12/20/22 0402  BP: (!) 111/49 (!) 145/75 129/77 101/72  Pulse: 86 (!) 102 99 92  Resp:  '18 18 18  '$ Temp: 98 F (36.7 C) 98.1 F (36.7 C) 98.2 F (36.8 C) 98.1 F (36.7 C)  TempSrc: Oral Oral Oral Oral  SpO2: 99% 93% 100% 99%  Weight:      Height:        Intake/Output Summary (Last 24 hours) at 12/20/2022 1125 Last data filed at 12/19/2022 2200 Gross per 24 hour  Intake 1000 ml  Output --  Net 1000 ml   Filed Weights   12/09/22 0546 12/17/22 0418 12/18/22 0509  Weight: 50.3 kg 49.4 kg 50.2 kg    Examination:  General exam: Looks frustrated  due to diarrhea, not in distress, deconditioned, lying in bed HEENT:  PERRL Respiratory system:  no wheezes or crackles  Cardiovascular system: S1 & S2 heard, RRR.  Gastrointestinal system: Abdomen is nondistended, soft and nontender.  G-tube Central nervous system: Alert and oriented, left hemiplegia Extremities: No edema, no clubbing ,no cyanosis Skin: No rashes, no ulcers,no icterus         Data Reviewed: I have personally reviewed following labs and imaging studies  CBC: Recent Labs  Lab 12/15/22 0623 12/17/22 0543 12/18/22 0527  WBC 9.9 10.1 10.2  NEUTROABS  --   --  6.0  HGB 9.2* 9.0* 8.6*  HCT 29.5* 29.3* 27.3*  MCV 90.2 92.4 91.3  PLT 426* 416* 188    Basic Metabolic Panel: Recent Labs  Lab 12/15/22 0623 12/15/22 1956 12/16/22 0637 12/17/22 0543 12/18/22 0527  NA 136 137 138 135 137  K 3.2* 4.5 4.2 4.6 4.3  CL 103 105 105 105 104  CO2 '27 26 24 23 27  '$ GLUCOSE 92 105* 84 104* 104*  BUN '17 14 13 14 13  '$ CREATININE 0.72 0.73 0.68 0.57 0.60  CALCIUM 8.7* 8.9 8.9 8.9  8.8*  MG  --   --  2.2  --   --   PHOS  --   --  3.7  --   --      No results found for this or any previous visit (from the past 240 hour(s)).    Radiology Studies: No results found.  Scheduled Meds:  (feeding supplement) PROSource Plus  30 mL Oral TID BM   aspirin  81 mg Per Tube Daily   atorvastatin  80 mg Per Tube QPC supper   clopidogrel  75 mg Per Tube Daily   diclofenac Sodium  4 g Topical QID   diphenoxylate-atropine  1 tablet Oral QID   ezetimibe  10 mg Per Tube Daily   famotidine  10.4 mg Per Tube QHS   feeding supplement (JEVITY 1.5 CAL/FIBER)  1,000 mL Per Tube Q24H   free water  100 mL Per Tube Q4H   melatonin  3 mg Per Tube QHS   multivitamin with minerals  1 tablet Per Tube Daily   saccharomyces boulardii  250 mg Per Tube BID   thiamine  100 mg Per Tube Daily   tiZANidine  2 mg Per Tube BID   Continuous Infusions:     LOS: 19 days   Shelly Coss, MD [facility] P2/06/2023, 11:25 AM

## 2022-12-20 NOTE — Plan of Care (Signed)
Patient [name], VSS throughout shift. Slurred speech baseline. Diminished lungs, IS encouraged. All meds given on time as ordered via G-tube. Pt c/o pain relieved by PRN morphine.  Tfs on-going at goal, 15m/hr. Dry gauze dressing intact over G-tube. Pt had bowel and bladder incontinence o/n.  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 POC maintained, will continue to monitor.   Problem: Education: Goal: Knowledge of General Education information will improve Description: Including pain rating scale, medication(s)/side effects and non-pharmacologic comfort measures 12/20/2022 0544 by HMarjory Sneddon RN Outcome: Progressing 12/20/2022 0544 by HMarjory Sneddon RN Outcome: Progressing   Problem: Health Behavior/Discharge Planning: Goal: Ability to manage health-related needs will improve 12/20/2022 0544 by HMarjory Sneddon RN Outcome: Progressing 12/20/2022 0544 by HMarjory Sneddon RN Outcome: Progressing   Problem: Clinical Measurements: Goal: Ability to maintain clinical measurements within normal limits will improve 12/20/2022 0544 by HMarjory Sneddon RN Outcome: Progressing 12/20/2022 0544 by HMarjory Sneddon RN Outcome: Progressing Goal: Will remain free from infection 12/20/2022 0544 by HMarjory Sneddon RN Outcome: Progressing 12/20/2022 0544 by HMarjory Sneddon RN Outcome: Progressing Goal: Diagnostic test results will improve 12/20/2022 0544 by HMarjory Sneddon RN Outcome: Progressing 12/20/2022 0544 by HMarjory Sneddon RN Outcome: Progressing Goal: Respiratory complications will improve 12/20/2022 0544 by HMarjory Sneddon RN Outcome: Progressing 12/20/2022 0544 by HMarjory Sneddon RN Outcome: Progressing Goal: Cardiovascular complication will be avoided 12/20/2022 0544 by HMarjory Sneddon RN Outcome: Progressing 12/20/2022 0544 by HMarjory Sneddon RN Outcome: Progressing   Problem: Activity: Goal: Risk for activity intolerance will decrease 12/20/2022 0544 by HMarjory Sneddon RN Outcome: Progressing 12/20/2022  0544 by HMarjory Sneddon RN Outcome: Progressing   Problem: Nutrition: Goal: Adequate nutrition will be maintained 12/20/2022 0544 by HMarjory Sneddon RN Outcome: Progressing 12/20/2022 0544 by HMarjory Sneddon RN Outcome: Progressing   Problem: Coping: Goal: Level of anxiety will decrease 12/20/2022 0544 by HMarjory Sneddon RN Outcome: Progressing 12/20/2022 0544 by HMarjory Sneddon RN Outcome: Progressing   Problem: Elimination: Goal: Will not experience complications related to bowel motility 12/20/2022 0544 by HMarjory Sneddon RN Outcome: Progressing 12/20/2022 0544 by HMarjory Sneddon RN Outcome: Progressing Goal: Will not experience complications related to urinary retention 12/20/2022 0544 by HMarjory Sneddon RN Outcome: Progressing 12/20/2022 0544 by HMarjory Sneddon RN Outcome: Progressing   Problem: Pain Managment: Goal: General experience of comfort will improve 12/20/2022 0544 by HMarjory Sneddon RN Outcome: Progressing 12/20/2022 0544 by HMarjory Sneddon RN Outcome: Progressing   Problem: Safety: Goal: Ability to remain free from injury will improve 12/20/2022 0544 by HMarjory Sneddon RN Outcome: Progressing 12/20/2022 0544 by HMarjory Sneddon RN Outcome: Progressing   Problem: Skin Integrity: Goal: Risk for impaired skin integrity will decrease 12/20/2022 0544 by HMarjory Sneddon RN Outcome: Progressing 12/20/2022 0544 by HMarjory Sneddon RN Outcome: Progressing

## 2022-12-20 NOTE — TOC Progression Note (Addendum)
Transition of Care [facility]) - Progression Note    Patient Details  Name: Kara Bailey MRN: [id number] Date of Birth: Oct 09, 1956  Transition of [facility] CM/SW [place], RN Phone [phone]  12/20/2022, 8:49 AM  Clinical Narrative:    CM has attempted to call son [name] and daughter [name] to discuss appeal and or disposition plan. There is no answer on either phone. Messages have been left.  1019 CM received text message from daughter Harriette Ohara  stating that she did not hear back from [facility] to follow up on the appeal. Daughter states that she will initate the appeal today.   1030 CM at bedside to update patient. Patient has been made aware that appeal has not been initiated. CM has made patient aware that if no plans have been initiated the plan may be to go home with tube feeds and home health. Patient verbalized understanding and states that she is just ready to go home. CM has updated MD and MD will attempt to reach out to family. Message has been sent to [facility] [employer] supervisor.   78 CM texted daughter to make her aware that CM has spoken with supervisor and family will need to file appeal today or we will need to proceed with plans to discharge patient home.   60 Daughter called CM and states that she is trying to call the appeal but is being told that she will need to file online. Daughter requesting that CM check the number to verify that she is able to file appeal via phone.   1107 Expedited appeal # [id number] has been texted to daughter and she states that this is the number that she has called.   1116 CM called [employer] [phone] option 0 spoke with a live representative who confirms that appeal can definitely  be filed via phone. This information has been forwarded to daughter who states that she will get appeal filed during her lunch break   1915 CM received message from daughter that appeal has been filed. Confirmation number [id number]. TOC will await  appeal decision.   Expected Discharge Plan: Skilled Nursing Facility Barriers to Discharge: SNF Authorization Denied  Expected Discharge Plan and Services In-house Referral: NA Discharge Planning Services: CM Consult Post Acute Care Choice: [place] Living arrangements for the past 2 months: Apartment Expected Discharge Date: 12/07/22               DME Arranged: N/A DME Agency: NA       HH Arranged: NA HH Agency: NA         Social Determinants of Health (SDOH) Interventions SDOH Screenings   Food Insecurity: Unknown (12/04/2022)  Housing: Low Risk  (12/04/2022)  Transportation Needs: Unknown (12/04/2022)  Utilities: Unknown (12/04/2022)  Depression (PHQ2-9): High Risk (09/19/2022)  Tobacco Use: High Risk (12/03/2022)    Readmission Risk Interventions    12/17/2022    1:42 PM  Readmission Risk Prevention Plan  Transportation Screening Complete  PCP or Specialist Appt within 3-5 Days Complete  HRI or [place] Complete  Social Work Consult for [place] Planning/Counseling [place] Not Applicable  Medication Review [employer]) Referral to Pharmacy

## 2022-12-20 NOTE — Progress Notes (Signed)
Speech Language Pathology Treatment: Dysphagia;Cognitive-Linquistic  Patient Details Name: Kara Bailey MRN: [id number] DOB: 09-09-1956 Today's Date: 12/20/2022 Time: 1550-1601 SLP Time Calculation (min) (ACUTE ONLY): 11 min  Assessment / Plan / Recommendation Clinical Impression  Pt seen for skilled SLP to address her swallowing and language skills. Today she reports having anxiety - and states family is coming to see her now. She politely declined to consume any po intake, stating she is worried that she will have more diarrhea. SLP encouraged her to continue po intake to maximize her swallowing rehab. Expressive language with use of gestures and speech was improved today, most notably with familiar topics and SLP provided pt with encouragement. Pt reports her tablet is not consistently working - - SLP able to open the speak program - but it appears to have been reformatted. Pt reported she did not want to deal with this today as she is having a difficult day. Downloaded speak program to her cell phone to allow its usage -- but comparing program to tablet - it is not as cohesive. Offered solid trials for pt but she declined. Recommend [place] SLP to maximize pt's swallow and speech rehab. Printed MBS report and placed in pt's soft chart to go home with her for [facility] SLP>    HPI HPI: Kara Bailey is a 67 y.o. female who [facility] ED on 11/29/22 with worsening dysphagia c/b inability to swallow pills or solid foods. She was sent to ED by PCP for PEG placement (PEG 12/03/22). PMHx includes CVAs, dysarthria,  first stroke seen in [facility] is 2022, HTN, prior epigastric hernia repair, iliac stent. Pt has a long hx of dysphagia and has worked with SLP in acute, inpatient, and OP settings over two years. She has had multiple MBS studies. 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  Pt's dysphagia has been primarily oral (difficulty with onset of oral phase and oral control) but pharyngeal function has been largely intact with good bolus clearance and adequate airway protection. Review of 11/30/22 esophagram reveals similar mechanics of swallow. 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  SLP returned to determine if pt would participate in session - RMT and AAC application.  Upon arrival, pt reporting she is wet and uncomfortable.      SLP Plan  Continue with current plan of care      Recommendations for follow up therapy are one component of a multi-disciplinary discharge planning process, led by the attending physician.  Recommendations may be updated based on patient status, additional functional criteria and insurance authorization.    Recommendations  Diet recommendations: Thin liquid;Nectar-thick liquid Liquids provided via: Straw Medication Administration:  (via peg) Supervision: Patient able to self feed Compensations: Slow rate;Small sips/bites;Other (Comment) Postural Changes and/or Swallow Maneuvers: Seated upright 90 degrees;Upright 30-60 min after meal                Oral Care Recommendations: Oral care BID Follow Up Recommendations: Home health SLP Assistance recommended at discharge: Intermittent Supervision/Assistance SLP Visit Diagnosis: Dysarthria and anarthria (R47.1);Aphonia (R49.1) Plan: Continue with current plan of care         Kathleen Lime, MS [place] Office [phone]   Macario Golds  12/20/2022, 4:31 PM

## 2022-12-20 NOTE — Progress Notes (Signed)
Occupational Therapy Treatment Patient Details Name: Kara Bailey MRN: [id number] DOB: Mar 25, 1956 Today's Date: 12/20/2022   History of present illness Patient is 67 year old female admitted 11/29/22 with multifactorial dysphagia; s/p PEG tube 12/03/22.  Pt with hx of CVA with L hemiparesis, HTN, iliac stent, hep C, GERD   OT comments  Resting hand splint removed and skin checked for redness and any abrasions which were absent. She reports no irritation or pain with 2.5 hours of splint wear. Her hand responded beautifully to splint - with her fingers relaxed and wrist with less resistant when splint removed. Palm guard placed. Once again asked patient to have daughter cut her nails. Patient instructed on splint wear schedule - to be in palm guard or resting hand splint during the day - with one break from both to allow skin a break - and to wear resting hand splint at night. Therapist placed nursing order for wear schedule and placed handout on the wall for nursing staff to see. Will follow up tomorrow to see how resting hand splint was tolerated through the night.    Recommendations for follow up therapy are one component of a multi-disciplinary discharge planning process, led by the attending physician.  Recommendations may be updated based on patient status, additional functional criteria and insurance authorization.    Follow Up Recommendations  Skilled nursing-short term rehab (<3 hours/day)     Assistance Recommended at Discharge Frequent or constant Supervision/Assistance  Patient can return home with the following  A lot of help with bathing/dressing/bathroom;Assistance with cooking/housework;Direct supervision/assist for medications management;Assist for transportation;Help with stairs or ramp for entrance;A lot of help with walking and/or transfers   Equipment Recommendations  None recommended by OT    Recommendations for Other Services      Precautions / Restrictions  Precautions Precautions: Fall Precaution Comments: hx of CVA last year with L sided weakness Restrictions Weight Bearing Restrictions: No          Extremity/Trunk Assessment Upper Extremity Assessment Upper Extremity Assessment: LUE deficits/detail LUE Deficits / Details: Therapist only able to passively range shoulder in external rotation to neutral and tight through motion, able to approx abduct shoulder to 45 degrees, 30 degrees FF. Elbow lacking approx 40 degrees and painful/stiff through movement. Wrist could be extended to just past neutral. fingers could be extended with effort to approx 75% of ROM. Pain in elbow and shoulder limiting PROM. LUE Coordination: decreased fine motor;decreased gross motor            Vision Patient Visual Report: No change from baseline            Cognition Arousal/Alertness: Awake/alert Behavior During Therapy: WFL for tasks assessed/performed Overall Cognitive Status: Within Functional Limits for tasks assessed                                 General Comments: Appears functional. Limited by expressive aphasia. Can point and make her needs known. Intermittently crying.                   Pertinent Vitals/ Pain       Pain Assessment Pain Assessment: No/denies pain Faces Pain Scale: Hurts little more Pain Location: L shoulder and elbow - with PROM Pain Descriptors / Indicators: Aching, Grimacing, Guarding Pain Intervention(s): Limited activity within patient's tolerance   Frequency  Min 2X/week        Progress Toward Goals  OT  Goals(current goals can now be found in the care plan section)  Progress towards OT goals: OT to reassess next treatment  Acute Rehab OT Goals Patient Stated Goal: to get out of hospital OT Goal Formulation: With patient Time For Goal Achievement: 12/25/22 Potential to Achieve Goals: [place] Discharge plan remains appropriate       AM-PAC OT "6 Clicks" Daily Activity      Outcome Measure   Help from another person eating meals?: A Little Help from another person taking care of personal grooming?: A Little Help from another person toileting, which includes using toliet, bedpan, or urinal?: A Lot Help from another person bathing (including washing, rinsing, drying)?: A Lot Help from another person to put on and taking off regular upper body clothing?: A Lot Help from another person to put on and taking off regular lower body clothing?: A Lot 6 Click Score: 14    End of Session Equipment Utilized During Treatment: Other (comment) (splints)  OT Visit Diagnosis: Unsteadiness on feet (R26.81);Muscle weakness (generalized) (M62.81);Pain;Hemiplegia and hemiparesis Hemiplegia - Right/Left: Left Hemiplegia - dominant/non-dominant: Non-Dominant Hemiplegia - caused by: Cerebral infarction Pain - Right/Left: Left Pain - part of body: Arm   Activity Tolerance Patient tolerated treatment well   Patient Left in bed;with call bell/phone within reach   Nurse Communication Mobility status        Time: 3846-6599 OT Time Calculation (min): 8 min  Charges: OT General Charges $OT Visit: 1 Visit OT Treatments $Orthotics Fit/Training: 8-22 mins $Orthotics/Prosthetics Check: 8-22 mins  Gustavo Lah, OTR/L Acute Care Rehab Services  Office [phone]   Lenward Chancellor 12/20/2022, 2:55 PM

## 2022-12-20 NOTE — Progress Notes (Signed)
Occupational Therapy Treatment Patient Details Name: Kara Bailey MRN: [id number] DOB: 1956-06-09 Today's Date: 12/20/2022   History of present illness Patient is 67 year old female admitted 11/29/22 with multifactorial dysphagia; s/p PEG tube 12/03/22.  Pt with hx of CVA with L hemiparesis, HTN, iliac stent, hep C, GERD   OT comments  Ms. Latash Nouri was in bed when therapist entered room without palm guard donned. Therapist passively ranged wrist and fingers to prepare for donning of new resting hand splint. Provided PROM to shoulder and elbow but patient limited by pain and patient did not tolerate well. Splint adjusted to allow approximately 30 degrees wrist flexion to assist with allowing finger extension and reduce spasticity/flexor tone in fingers while at rest. Thumb placed in abduction and flexion. After straps applied and arm at rest no apparent tightness of fingers noted. Therapist requested patient ask family member to cut her nails to reduce risk of fingernails slicing into palm.   Patient declined OOB therapy and will attempt this afternoon. Will f/u today to check fit of resting hand splint.   Recommendations for follow up therapy are one component of a multi-disciplinary discharge planning process, led by the attending physician.  Recommendations may be updated based on patient status, additional functional criteria and insurance authorization.    Follow Up Recommendations  Skilled nursing-short term rehab (<3 hours/day)     Assistance Recommended at Discharge Frequent or constant Supervision/Assistance  Patient can return home with the following  A lot of help with bathing/dressing/bathroom;Assistance with cooking/housework;Direct supervision/assist for medications management;Assist for transportation;Help with stairs or ramp for entrance;A lot of help with walking and/or transfers   Equipment Recommendations  Other (comment) (Defer)    Recommendations for Other Services       Precautions / Restrictions Precautions Precautions: Fall Precaution Comments: hx of CVA last year with L sided weakness          Extremity/Trunk Assessment Upper Extremity Assessment Upper Extremity Assessment: LUE deficits/detail LUE Deficits / Details: Therapist only able to passively range shoulder in external rotation to neutral and tight through motion, able to approx abduct shoulder to 45 degrees, 30 degrees FF. Elbow lacking approx 40 degrees and painful/stiff through movement. Wrist could be extended to just past neutral. fingers could be extended with effort to approx 75% of ROM. Pain in elbow and shoulder limiting PROM. LUE Coordination: decreased fine motor;decreased gross motor            Vision Patient Visual Report: No change from baseline            Cognition Arousal/Alertness: Awake/alert Behavior During Therapy: WFL for tasks assessed/performed Overall Cognitive Status: Within Functional Limits for tasks assessed                                 General Comments: Appears functional. Limited by expressive aphasia. Can point and make her needs known. Intermittently crying.                   Pertinent Vitals/ Pain       Pain Assessment Pain Assessment: Faces Faces Pain Scale: Hurts little more Pain Location: L shoulder and elbow - with PROM Pain Descriptors / Indicators: Aching, Grimacing, Guarding Pain Intervention(s): Limited activity within patient's tolerance   Frequency  Min 2X/week        Progress Toward Goals  OT Goals(current goals can now be found in the care plan section)  Progress towards OT goals: OT to reassess next treatment  Acute Rehab OT Goals Patient Stated Goal: to get out of hospital OT Goal Formulation: With patient Time For Goal Achievement: 12/25/22 Potential to Achieve Goals: [place] Discharge plan remains appropriate    Co-evaluation                 AM-PAC OT "6 Clicks" Daily  Activity     Outcome Measure   Help from another person eating meals?: A Little Help from another person taking care of personal grooming?: A Little Help from another person toileting, which includes using toliet, bedpan, or urinal?: A Lot Help from another person bathing (including washing, rinsing, drying)?: A Lot Help from another person to put on and taking off regular upper body clothing?: A Lot Help from another person to put on and taking off regular lower body clothing?: A Lot 6 Click Score: 14    End of Session Equipment Utilized During Treatment: Other (comment) (resting hand splint)  OT Visit Diagnosis: Unsteadiness on feet (R26.81);Muscle weakness (generalized) (M62.81);Pain;Hemiplegia and hemiparesis Hemiplegia - Right/Left: Left Pain - Right/Left: Left Pain - part of body: Arm   Activity Tolerance Patient tolerated treatment well   Patient Left with call bell/phone within reach;with bed alarm set   Nurse Communication          Time: 9702-6378 OT Time Calculation (min): 16 min  Charges: OT General Charges $OT Visit: 1 Visit OT Treatments $Orthotics Fit/Training: 8-22 mins  Gustavo Lah, OTR/L [place]  Office [phone]   Lenward Chancellor 12/20/2022, 12:42 PM

## 2022-12-20 NOTE — Progress Notes (Signed)
Nutrition Follow-up  DOCUMENTATION CODES:   Severe malnutrition in context of chronic illness  INTERVENTION:   -Transition to fiber containing formula in order to aid diarrhea. -Jevity 1.5 @ 40 ml/hr via PEG -Provides 1440 kcals, 61g protein and 729 ml H2O, 20g fiber daily -Free water: 100 ml every 4 hours (600 ml)   -Continue Prosource Plus PO TID, each provides 100 kcals and 15g protein -Multivitamin with minerals daily (crushed pill, no liquid d/t osmolality) -100 mg Thiamine daily  -d/c Boost Breeze as pt was refusing -d/c Banatrol BID  NUTRITION DIAGNOSIS:   Severe Malnutrition related to chronic illness, dysphagia (h/o CVA) as evidenced by moderate fat depletion, severe muscle depletion, energy intake < or equal to 75% for > or equal to 1 month.  Ongoing.  GOAL:   Patient will meet greater than or equal to 90% of their needs  Meeting with TF + PO supplements  MONITOR:   PO intake, Supplement acceptance, Labs, Weight trends, I & O's  REASON FOR ASSESSMENT:   Consult Enteral/tube feeding initiation and management  ASSESSMENT:   67 year old African-American female history of multiple strokes, dense left hemiparesis, hypertension who presents to the ER today with worsening dysphagia.  Patient completed Augmentin 2/6.  Pt complains of continued diarrhea. Will trial a fiber containing formula to help. Will switch to Jevity 1.5 @ 40 ml/hr which will provide ~20g fiber daily. Pt was receiving ~10g fiber from Banatrol BID previously, d/c this to not provide too much fiber.   Pt was receiving liquid MVI via tube, switched to crushable pill given osmolality of liquid MVI.   Medications:  Labs reviewed.  Diet Order:   Diet Order             Diet full liquid Room service appropriate? Yes; Fluid consistency: Thin  Diet effective now           Diet general                   EDUCATION NEEDS:   Education needs have been addressed  Skin:  Skin Assessment:  Reviewed RN Assessment  Last BM:  2/8 -type 7  Height:   Ht Readings from Last 1 Encounters:  11/30/22 '5\' 6"'$  (1.676 m)    Weight:   Wt Readings from Last 1 Encounters:  12/18/22 50.2 kg    BMI:  Body mass index is 17.86 kg/m.  Estimated Nutritional Needs:   Kcal:  1610-9604  Protein:  80-95g  Fluid:  1.8L/day  Clayton Bibles, MS, RD, LDN Inpatient Clinical Dietitian Contact information available via Amion

## 2022-12-21 DIAGNOSIS — I69391 Dysphagia following cerebral infarction: Secondary | ICD-10-CM | POA: Diagnosis not present

## 2022-12-21 MED ORDER — DIPHENOXYLATE-ATROPINE 2.5-0.025 MG PO TABS
1.0000 | ORAL_TABLET | Freq: Four times a day (QID) | ORAL | Status: DC
  Administered 2022-12-21 – 2022-12-27 (×24): 1
  Filled 2022-12-21 (×22): qty 1

## 2022-12-21 MED ORDER — PROSOURCE TF20 ENFIT COMPATIBL EN LIQD
60.0000 mL | Freq: Three times a day (TID) | ENTERAL | Status: DC
  Administered 2022-12-21 – 2023-01-14 (×68): 60 mL
  Filled 2022-12-21 (×75): qty 60

## 2022-12-21 NOTE — TOC Progression Note (Addendum)
Transition of Care [facility]) - Progression Note    Patient Details  Name: Kara Bailey MRN: [id number] Date of Birth: 1956-05-13  [facility] CM/SW Contact  Henrietta Dine, RN Phone Number: 12/21/2022, 1:25 PM  Clinical Narrative:    Notified by Lavonna Monarch, RN "pt's son has some questions concerning the facility of where she will be going at discharge"; called [employer] (([phone], option [phone]) and spoke w/ Phineas Semen, Rep; she requested call back # in case call disconnected; gave call back # per her request; she placed this CM on hold so that she can review records; waited x 12 minutes but call disconnected   -[phone]- called back and spoke w/ Anderson Malta, Rep; she says appeal is still in review and has 72 hr turn around time; since this falls on Sunday, determination will be made on Monday morning (12/24/22); she also request they be called on Monday morning by TOC or pt's dtr; will pass on to oncoming TOC.   -[place]- called pt's son, [name] [phone]) and updated him on appeal status; he verbalized understanding.  Expected Discharge Plan: Skilled Nursing Facility Barriers to Discharge: SNF Authorization Denied  Expected Discharge Plan and Services In-house Referral: NA Discharge Planning Services: CM Consult Post Acute Care Choice: [place] Living arrangements for the past 2 months: Apartment Expected Discharge Date: 12/07/22               DME Arranged: N/A DME Agency: NA       HH Arranged: NA HH Agency: NA         Social Determinants of Health (SDOH) Interventions SDOH Screenings   Food Insecurity: Unknown (12/04/2022)  Housing: Low Risk  (12/04/2022)  Transportation Needs: Unknown (12/04/2022)  Utilities: Unknown (12/04/2022)  Depression (PHQ2-9): High Risk (09/19/2022)  Tobacco Use: High Risk (12/03/2022)    Readmission Risk Interventions    12/17/2022    1:42 PM  Readmission Risk Prevention Plan  Transportation Screening Complete  PCP  or Specialist Appt within 3-5 Days Complete  HRI or [place] Complete  Social Work Consult for [place] Planning/Counseling [place] Not Applicable  Medication Review Press photographer) Referral to Pharmacy

## 2022-12-21 NOTE — Progress Notes (Addendum)
Occupational Therapy Treatment Patient Details Name: Kara Bailey MRN: [id number] DOB: [birth year] Today's Date: 12/21/2022   History of present illness Patient is 67 year old female admitted 11/29/22 with multifactorial dysphagia; s/p PEG tube 12/03/22.  Pt with hx of CVA with L hemiparesis, HTN, iliac stent, hep C, GERD   OT comments  Patient presents today supine in bed and not wearing neither splint. She reports she had palm guard removed prior to her nap. She stated that the blue splint is to be worn at night. Therapist corrected her and educated her that the blue resting hand splint can be worn in the day as well - as that splint helps relax her fingers and reduce tone. She verbalized understanding. Patient min assist to transfer to edge of bed with just a hand hold. Attempted to perform some external shoulder ROM and elbow extension but patient did not tolerate due to pain and tone. Therapist applied resting hand splint. Patient mod assist to stand and mod assist to pivot to recliner holding onto straight back chair with right hand. She is very fearful and cries in anticipation of standing and had to be calmed down. Therapist had patient stand again from recliner and work on static standing as needed to complete ADLs. She was able to don one sock in seated position as well.   Therapist reviewed therapy notes from October and patient was min guard to min assist with hand assist to pull for transfers and mostly min assist for ADLs except for mod assist in predominantly seated surface. Patient has had some decline in functional abilities since being home. She is more fearful of transfers/ambulation and needing more assist for ADLs. Would recommend short term rehab at discharge to maximize her functional abilities and reduce caregiver burden prior to return home.    Recommendations for follow up therapy are one component of a multi-disciplinary discharge planning process, led by the attending  physician.  Recommendations may be updated based on patient status, additional functional criteria and insurance authorization.    Follow Up Recommendations  Skilled nursing-short term rehab (<3 hours/day)     Assistance Recommended at Discharge Frequent or constant Supervision/Assistance  Patient can return home with the following  A lot of help with bathing/dressing/bathroom;Assistance with cooking/housework;Direct supervision/assist for medications management;Assist for transportation;Help with stairs or ramp for entrance;A lot of help with walking and/or transfers   Equipment Recommendations  None recommended by OT    Recommendations for Other Services      Precautions / Restrictions Precautions Precautions: Fall Precaution Comments: hx of CVA last year with L sided weakness Restrictions Weight Bearing Restrictions: No       Mobility Bed Mobility Overal bed mobility: Needs Assistance Bed Mobility: Supine to Sit     Supine to sit: Min assist, HOB elevated     General bed mobility comments: Min assist for hand hold to transfer to edge of bed. Therapist cued patient to use RUE to assist LLE.    Transfers Overall transfer level: Needs assistance   Transfers: Sit to/from Stand Sit to Stand: Mod assist Stand pivot transfers: Mod assist         General transfer comment: Mod assist to pivot from bed to recliner holding onto back of straight back chair.     Balance Overall balance assessment: Needs assistance Sitting-balance support: No upper extremity supported, Feet supported Sitting balance-Leahy Scale: Good     Standing balance support: Single extremity supported Standing balance-Leahy Scale: Poor  ADL either performed or assessed with clinical judgement   ADL Overall ADL's : Needs assistance/impaired                     Lower Body Dressing: Maximal assistance;Sit to/from stand Lower Body Dressing Details  (indicate cue type and reason): able to don right sock using figure four method. Needs assistance to don left sock due to LLE ROM deficits             Functional mobility during ADLs: Moderate assistance General ADL Comments: Mod assist for standing and pivoting to work on transfers - needed for [facility].  Used straight back chair for RUE support.    Extremity/Trunk Assessment              Vision Patient Visual Report: No change from baseline     Perception     Praxis      Cognition Arousal/Alertness: Awake/alert Behavior During Therapy: WFL for tasks assessed/performed Overall Cognitive Status: Within Functional Limits for tasks assessed                                          Exercises      Shoulder Instructions       General Comments      Pertinent Vitals/ Pain       Pain Assessment Pain Assessment: Faces Faces Pain Scale: Hurts even more Pain Location: L shoulder with external rotation / positining Pain Descriptors / Indicators: Grimacing, Guarding Pain Intervention(s): Limited activity within patient's tolerance  Home Living                                          Prior Functioning/Environment              Frequency  Min 2X/week        Progress Toward Goals  OT Goals(current goals can now be found in the care plan section)  Progress towards OT goals: Progressing toward goals  Acute Rehab OT Goals Patient Stated Goal: move better OT Goal Formulation: With patient Time For Goal Achievement: 12/25/22 Potential to Achieve Goals: [place] Discharge plan remains appropriate    Co-evaluation                 AM-PAC OT "6 Clicks" Daily Activity     Outcome Measure   Help from another person eating meals?: A Little Help from another person taking care of personal grooming?: A Little Help from another person toileting, which includes using toliet, bedpan, or urinal?: A Lot Help from another person  bathing (including washing, rinsing, drying)?: A Lot Help from another person to put on and taking off regular upper body clothing?: A Lot Help from another person to put on and taking off regular lower body clothing?: A Lot 6 Click Score: 14    End of Session Equipment Utilized During Treatment: Gait belt  OT Visit Diagnosis: Unsteadiness on feet (R26.81);Muscle weakness (generalized) (M62.81);Pain;Hemiplegia and hemiparesis Hemiplegia - Right/Left: Left Hemiplegia - dominant/non-dominant: Dominant Hemiplegia - caused by: Cerebral infarction Pain - Right/Left: Left Pain - part of body: Arm   Activity Tolerance Patient tolerated treatment well   Patient Left in chair;Other (comment) (In care of PTA Cecille Rubin)   Nurse Communication Mobility status  Time: [id number] OT Time Calculation (min): 28 min  Charges: OT General Charges $OT Visit: 1 Visit OT Treatments $Therapeutic Activity: 23-37 mins  Gustavo Lah, OTR/L [place]  Office [phone]   Lenward Chancellor 12/21/2022, 3:30 PM

## 2022-12-21 NOTE — Progress Notes (Signed)
Physical Therapy Treatment Patient Details Name: Kara Bailey MRN: [id number] DOB: August 16, 1956 Today's Date: 12/21/2022   History of Present Illness Patient is 67 year old female admitted 11/29/22 with multifactorial dysphagia; s/p PEG tube 12/03/22.  Pt with hx of CVA with L hemiparesis, HTN, iliac stent, hep C, GERD    PT Comments    Called to room by NT to assist pt to [facility] due to incont BM.  General transfer comment: assisted from recliner to [facility] for BM then from [facility] to bed having pt hold to steady/fixed bed rail.  Pt was able to assist by pulling self up.  Static stand at [place] for hygiene then Mod Assist to complete pivot. General bed mobility comments: assisted back to bed and positioned upright as Tube feeding was running as well as  to comfort   Recommendations for follow up therapy are one component of a multi-disciplinary discharge planning process, led by the attending physician.  Recommendations may be updated based on patient status, additional functional criteria and insurance authorization.  Follow Up Recommendations  Skilled nursing-short term rehab (<3 hours/day) Can patient physically be transported by private vehicle: No   Assistance Recommended at Discharge Frequent or constant Supervision/Assistance  Patient can return home with the following A little help with bathing/dressing/bathroom;Assistance with cooking/housework;Help with stairs or ramp for entrance;A lot of help with walking and/or transfers   Equipment Recommendations  None recommended by PT    Recommendations for Other Services       Precautions / Restrictions Precautions Precautions: Fall Precaution Comments: hx of CVA last year with L sided weakness Restrictions Weight Bearing Restrictions: No     Mobility  Bed Mobility Overal bed mobility: Needs Assistance Bed Mobility: Sit to Supine       Sit to supine: Mod assist, Max assist   General bed mobility comments: assisted back to bed  and positioned upright as Tube feeding was running as well as  to comfort    Transfers Overall transfer level: Needs assistance Equipment used: 1 person hand held assist Transfers: Bed to chair/wheelchair/BSC Sit to Stand: Mod assist Stand pivot transfers: Mod assist, Min assist         General transfer comment: assisted from recliner to [facility] for BM then from [facility] to bed having pt hold to steady/fixed bed rail.  Pt was able to assist by pulling self up.  Static stand at [place] for hygiene then Mod Assist to complete pivot.    Ambulation/Gait     General Gait Details: transfers only this session   Stairs             Wheelchair Mobility    Modified Rankin (Stroke Patients Only)       Balance                                            Cognition Arousal/Alertness: Awake/alert Behavior During Therapy: WFL for tasks assessed/performed Overall Cognitive Status: Within Functional Limits for tasks assessed                                 General Comments: Appears functional. Limited by expressive aphasia. Can point and make her needs known.        Exercises      General Comments  Pertinent Vitals/Pain Pain Assessment Pain Assessment: Faces Faces Pain Scale: Hurts a little bit Pain Location: L shoulder with external rotation / positining Pain Descriptors / Indicators: Grimacing, Guarding Pain Intervention(s): Monitored during session    Home Living                          Prior Function            PT Goals (current goals can now be found in the care plan section) Progress towards PT goals: Progressing toward goals    Frequency    Min 2X/week      PT Plan Current plan remains appropriate    Co-evaluation              AM-PAC PT "6 Clicks" Mobility   Outcome Measure  Help needed turning from your back to your side while in a flat bed without using bedrails?: A Lot Help needed  moving from lying on your back to sitting on the side of a flat bed without using bedrails?: A Lot Help needed moving to and from a bed to a chair (including a wheelchair)?: A Lot Help needed standing up from a chair using your arms (e.g., wheelchair or bedside chair)?: A Lot Help needed to walk in hospital room?: A Lot Help needed climbing 3-5 steps with a railing? : Total 6 Click Score: 11    End of Session Equipment Utilized During Treatment: Gait belt Activity Tolerance: Patient tolerated treatment well Patient left: in bed;with call bell/phone within reach;with bed alarm set Nurse Communication: Mobility status PT Visit Diagnosis: Other abnormalities of gait and mobility (R26.89);Unsteadiness on feet (R26.81) Hemiplegia - Right/Left: Left Hemiplegia - dominant/non-dominant: Non-dominant Hemiplegia - caused by: Cerebral infarction     Time: 1541 - 1600 PT Time Calculation (min) (ACUTE ONLY): 19 min  Charges: 1 therapeutic activity                   {Sakura Denis  PTA Acute  Rehabilitation Services Office M-F          [phone] Weekend pager [phone]

## 2022-12-21 NOTE — Progress Notes (Signed)
Nutrition Follow-up  DOCUMENTATION CODES:   Severe malnutrition in context of chronic illness  INTERVENTION:   -Continue Jevity 1.5 @ goal rate of 40 ml/hr via PEG -60 ml Prosource TF 20 TID -Provides 1680 kcals, 121g protein and 729 ml H2O, 20g fiber daily -Free water: 100 ml every 4 hours (600 ml)    -Multivitamin with minerals daily (crushed pill, no liquid d/t osmolality) -100 mg Thiamine daily  NUTRITION DIAGNOSIS:   Severe Malnutrition related to chronic illness, dysphagia (h/o CVA) as evidenced by moderate fat depletion, severe muscle depletion, energy intake < or equal to 75% for > or equal to 1 month.  Ongoing.  GOAL:   Patient will meet greater than or equal to 90% of their needs  Progressing.  MONITOR:   PO intake, Supplement acceptance, Labs, Weight trends, I & O's   ASSESSMENT:   67 year old African-American female history of multiple strokes, dense left hemiparesis, hypertension who presents to the ER today with worsening dysphagia.  Patient now preferring Prosource to be provided via tube. Received message from Pharmacy that supplement was switched to Prosource TF 20 via tube.  Pt with improved diarrhea and seems to be tolerating current regimen.  Admission weight: 112 lbs Last weight 2/6: 110 lbs  Medications: Lomotil, Pepcid, Multivitamin with minerals daily, Florastor, Thiamine  Labs reviewed.   Diet Order:   Diet Order             Diet full liquid Room service appropriate? Yes; Fluid consistency: Thin  Diet effective now           Diet general                   EDUCATION NEEDS:   Education needs have been addressed  Skin:  Skin Assessment: Reviewed RN Assessment  Last BM:  2/9 -type 6  Height:   Ht Readings from Last 1 Encounters:  11/30/22 5' 6"$  (1.676 m)    Weight:   Wt Readings from Last 1 Encounters:  12/18/22 50.2 kg    BMI:  Body mass index is 17.86 kg/m.  Estimated Nutritional Needs:   Kcal:   [id number]  Protein:  80-95g  Fluid:  1.8L/day   Clayton Bibles, MS, RD, LDN Inpatient Clinical Dietitian Contact information available via Amion

## 2022-12-21 NOTE — Progress Notes (Signed)
Physical Therapy Treatment Patient Details Name: Kara Bailey MRN: [id number] DOB: 1956/09/03 Today's Date: 12/21/2022   History of Present Illness Patient is 67 year old female admitted 11/29/22 with multifactorial dysphagia; s/p PEG tube 12/03/22.  Pt with hx of CVA with L hemiparesis, HTN, iliac stent, hep C, GERD    PT Comments    Pt AxO x 3 pleasant and cooperative.  Expressive aphagia.  Mostly uses hand gestures to express needs but can speak a few words with effort.  OT in room on arrival.  Assisted with amb pt.  General Gait Details: first amb a short distance with Hemi walker @ Max/Total Assist for support as well as weight shift and stabelize L knee buckle.  Second assist following with recliner.  Then amb in hjallway using RIGHT wall rail another 6 feet at [place].  Pt able to partially advance L LE a short step but required physical assist to prevent L knee buckle during stance.  Recliner following closely behind.  Pt performed better using wall rail. Per chart review, pt was amb 45 - 55 feet in October 2023 Therapy notes.  Pt will need ST Rehab at SNF to address mobility and functional decline prior to safely returning home.   Recommendations for follow up therapy are one component of a multi-disciplinary discharge planning process, led by the attending physician.  Recommendations may be updated based on patient status, additional functional criteria and insurance authorization.  Follow Up Recommendations  Skilled nursing-short term rehab (<3 hours/day) Can patient physically be transported by private vehicle: No   Assistance Recommended at Discharge Frequent or constant Supervision/Assistance  Patient can return home with the following A little help with bathing/dressing/bathroom;Assistance with cooking/housework;Help with stairs or ramp for entrance;A lot of help with walking and/or transfers   Equipment Recommendations  None recommended by PT    Recommendations for Other  Services       Precautions / Restrictions Precautions Precautions: Fall Precaution Comments: hx of CVA last year with L sided weakness Restrictions Weight Bearing Restrictions: No     Mobility  Bed Mobility               General bed mobility comments: OOB in recliner via OT    Transfers Overall transfer level: Needs assistance Equipment used: 1 person hand held assist Transfers: Sit to/from Stand Sit to Stand: Mod assist           General transfer comment: Mod Assist on Pt's LEFT side  to rise from reliner. Assist also needed to control stand to sit.    Ambulation/Gait Ambulation/Gait assistance: Max assist, Total assist Gait Distance (Feet): 12 Feet (6 feet x 2) Assistive device: Hemi-walker Gait Pattern/deviations: Step-to pattern, Decreased stance time - left Gait velocity: decreased     General Gait Details: first amb a short distance with Hemi walker @ Max/Total Assist for support as well as weight shift and stabelize L knee buckle.  Second assist following with recliner.  Then amb in hjallway using RIGHT wall rail another 6 feet at [place].  Pt able to partially advance L LE a short step but required physical assist to prevent L knee buckle during stance.  Recliner following closely behind.   Stairs             Wheelchair Mobility    Modified Rankin (Stroke Patients Only)       Balance  Cognition Arousal/Alertness: Awake/alert Behavior During Therapy: WFL for tasks assessed/performed Overall Cognitive Status: Within Functional Limits for tasks assessed                                 General Comments: Appears functional. Limited by expressive aphasia. Can point and make her needs known.        Exercises      General Comments        Pertinent Vitals/Pain Pain Assessment Pain Assessment: Faces Faces Pain Scale: Hurts a little bit Pain Location: L  shoulder with external rotation / positining Pain Descriptors / Indicators: Grimacing, Guarding Pain Intervention(s): Monitored during session    Home Living                          Prior Function            PT Goals (current goals can now be found in the care plan section) Progress towards PT goals: Progressing toward goals    Frequency    Min 2X/week      PT Plan Current plan remains appropriate    Co-evaluation              AM-PAC PT "6 Clicks" Mobility   Outcome Measure  Help needed turning from your back to your side while in a flat bed without using bedrails?: A Lot Help needed moving from lying on your back to sitting on the side of a flat bed without using bedrails?: A Lot Help needed moving to and from a bed to a chair (including a wheelchair)?: A Lot Help needed standing up from a chair using your arms (e.g., wheelchair or bedside chair)?: A Lot Help needed to walk in hospital room?: A Lot Help needed climbing 3-5 steps with a railing? : Total 6 Click Score: 11    End of Session Equipment Utilized During Treatment: Gait belt Activity Tolerance: Patient tolerated treatment well Patient left: in chair;with call bell/phone within reach Nurse Communication: Mobility status PT Visit Diagnosis: Other abnormalities of gait and mobility (R26.89);Unsteadiness on feet (R26.81) Hemiplegia - Right/Left: Left Hemiplegia - dominant/non-dominant: Non-dominant Hemiplegia - caused by: Cerebral infarction     Time:  1452 - 1504 PT Time Calculation (min) (ACUTE ONLY): 12 min  Charges:  $Gait Training: 8-22 mins                     {

## 2022-12-21 NOTE — Progress Notes (Signed)
PROGRESS NOTE  Kara Bailey  [id number] DOB: 08/15/1956 DOA: 11/29/2022 PCP: Andree Moro, DO   Brief Narrative: Patient is a 67 year old female with history of CVA, hypertension presented the emergency department at the request of her PCP for the evaluation of persistent dysphagia.  Found to have multifactorial dysphagia likely from stroke.  Status post G-tube placement by IR.  PT/OT recommending SNF on discharge.  Developed nausea, vomiting, abdominal pain on 1/26,  CT abdomen/pelvis showed possible cholecystitis, MRCP confirmed acute cholecystitis.  General surgery was  following.  Significant improvement abdominal pain, nausea, vomiting, liver enzymes.  Plan is to continue antibiotics without any operative intervention.  Diet started and she is tolerating.  Medically stable for discharge to SNF whenever possible.TOC following,family( daughter) was supposed to appeal  against denial, but TOC has not been able to contact the daughter.  Hospital course remarkable for persistent diarrhea.  Assessment & Plan:  Principal Problem:   Dysphagia as late effect of cerebrovascular accident (CVA) Active Problems:   Essential hypertension   Left hemiplegia (HCC)   History of stroke   Dysphagia   Protein-calorie malnutrition, severe  Dysphagia: Likely secondary to residual effect of the ischemic stroke.  Has history of esophageal stricture last dilated on 2021.  GI consulted and was following.  Barium swallow done on 11/30/2022 was nondiagnostic.  Underwent G-tube placement by IR on 1/22.  Underwent MBS again  speech therapy to check if she can swallow orally.Started on full liquid diet. Continue tube feeding diet, full liquid diet.   Abdomen pain, nausea, vomiting/acute cholecystitis: Resolved.  Likely acalculous cholecystitis.  CT abdomen/pelvis, MRCP confirmed acute cholecystitis without stones.  Patient was complaining of   periumbilical abdominal pain but now it has resolved.  Abdomen remains  soft, nondistended, nontender with good bowel sounds.   Liver enzymes were elevated, now normalized.  General surgery following signed off.  Abx changed to Augmentin,now completed the course .  Diarrhea: She was having diarrhea thought to be from antibiotics and also tube feeding.  We requested nutritionist to change the tube diet.  Also using Lomotil round-the-clock.  Checking GI pathogen panel,but not obtained yet.  Holding checking C. difficile after discussing with Dr. Lita Mains.  Continue probiotics. Diarrhea now starting to slow down  Severe protein calorie malnutrition: Nutrition was following.  BMI of 18.  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  On Plavix, aspirin, Lipitor, Zetia.  Also on baclofen and Zanaflex for left-sided hemiplegia and spasticity  Hypertension: Currently blood pressure soft/stable.  Not on any medications  Hypomagnesemia/hypokalemia: Continue magnesium supplementation.  Depression: On celexa  Normocytic anemia: No evidence of acute blood loss.  Currently hemoglobin stable in the range of 8-9  Debility/deconditioning: PT/OT recommending SNF on discharge.TOC following      Nutrition Problem: Severe Malnutrition Etiology: chronic illness, dysphagia (h/o CVA)    DVT prophylaxis:SCDs Start: 11/30/22 0151     Code Status: Full Code  Family Communication: Called daughter [name] on phone on 2/8  Patient status:Inpatient  Patient is from :Home  Anticipated discharge to:SnF  Estimated DC date: as soon as bed is available,denied by insurance but appeal submitted by family   Consultants: GI,IR,surgery  Procedures:G tube placement   Antimicrobials:  Anti-infectives (From admission, onward)    Start     Dose/Rate Route Frequency Ordered Stop   12/11/22 2200  amoxicillin-clavulanate (AUGMENTIN) 400-57 MG/5ML suspension 875 mg        875 mg Per Tube  Every 12 hours 12/11/22 1137 12/18/22  1039   12/11/22 1000  amoxicillin-clavulanate (AUGMENTIN) 875-125 MG per tablet 1 tablet  Status:  Discontinued        1 tablet Oral Every 12 hours 12/11/22 0821 12/11/22 1137   12/08/22 2000  piperacillin-tazobactam (ZOSYN) IVPB 3.375 g  Status:  Discontinued        3.375 g 12.5 mL/hr over 240 Minutes Intravenous Every 8 hours 12/08/22 1900 12/11/22 0821   12/07/22 1130  cefadroxil (DURICEF) capsule 500 mg  Status:  Discontinued        500 mg Oral 2 times daily 12/07/22 1033 12/07/22 1033   12/07/22 1130  cefadroxil (DURICEF) capsule 500 mg  Status:  Discontinued        500 mg Oral 2 times daily 12/07/22 1033 12/07/22 1034   12/07/22 1130  cefadroxil (DURICEF) capsule 500 mg  Status:  Discontinued        500 mg Per Tube 2 times daily 12/07/22 1034 12/08/22 1903   12/03/22 0700  ceFAZolin (ANCEF) IVPB 2g/100 mL premix        2 g 200 mL/hr over 30 Minutes Intravenous To Radiology 11/30/22 1602 12/04/22 0700       Subjective: Patient seen and examined at bedside today.  Hemodynamically stable.  Diarrhea has started to slow down.  She is comfortable without any abdominal pain, nausea or vomiting   Objective: Vitals:   12/20/22 0402 12/20/22 1326 12/20/22 2033 12/21/22 0507  BP: 101/72 120/62 124/63 127/76  Pulse: 92 87 89 84  Resp: 18 18 18 18  $ Temp: 98.1 F (36.7 C) 97.8 F (36.6 C) 97.8 F (36.6 C) 98.2 F (36.8 C)  TempSrc: Oral Oral Oral Oral  SpO2: 99% 100% 99% 100%  Weight:      Height:        Intake/Output Summary (Last 24 hours) at 12/21/2022 1128 Last data filed at 12/21/2022 0900 Gross per 24 hour  Intake 200 ml  Output --  Net 200 ml   Filed Weights   12/09/22 0546 12/17/22 0418 12/18/22 0509  Weight: 50.3 kg 49.4 kg 50.2 kg    Examination:   General exam: Overall comfortable, not in distress,, deconditioned HEENT: PERRL Respiratory system:  no wheezes or crackles  Cardiovascular system: S1 & S2 heard, RRR.  Gastrointestinal system: Abdomen is  nondistended, soft and nontender.  G-tube Central nervous system: Alert and oriented, left hemiplegia Extremities: No edema, no clubbing ,no cyanosis Skin: No rashes, no ulcers,no icterus        Data Reviewed: I have personally reviewed following labs and imaging studies  CBC: Recent Labs  Lab 12/15/22 0623 12/17/22 0543 12/18/22 0527  WBC 9.9 10.1 10.2  NEUTROABS  --   --  6.0  HGB 9.2* 9.0* 8.6*  HCT 29.5* 29.3* 27.3*  MCV 90.2 92.4 91.3  PLT 426* 416* [id number]    Basic Metabolic Panel: Recent Labs  Lab 12/15/22 0623 12/15/22 1956 12/16/22 0637 12/17/22 0543 12/18/22 0527  NA 136 137 138 135 137  K 3.2* 4.5 4.2 4.6 4.3  CL 103 105 105 105 104  CO2 27 26 24 23 27  $ GLUCOSE 92 105* 84 104* 104*  BUN 17 14 13 14 13  $ CREATININE 0.72 0.73 0.68 0.57 0.60  CALCIUM 8.7* 8.9 8.9 8.9 8.8*  MG  --   --  2.2  --   --   PHOS  --   --  3.7  --   --  No results found for this or any previous visit (from the past 240 hour(s)).    Radiology Studies: No results found.  Scheduled Meds:  (feeding supplement) PROSource Plus  30 mL Oral TID BM   aspirin  81 mg Per Tube Daily   atorvastatin  80 mg Per Tube QPC supper   clopidogrel  75 mg Per Tube Daily   diclofenac Sodium  4 g Topical QID   diphenoxylate-atropine  1 tablet Oral QID   ezetimibe  10 mg Per Tube Daily   famotidine  10.4 mg Per Tube QHS   feeding supplement (JEVITY 1.5 CAL/FIBER)  1,000 mL Per Tube Q24H   free water  100 mL Per Tube Q4H   melatonin  3 mg Per Tube QHS   multivitamin with minerals  1 tablet Per Tube Daily   saccharomyces boulardii  250 mg Per Tube BID   thiamine  100 mg Per Tube Daily   tiZANidine  2 mg Per Tube BID   Continuous Infusions:     LOS: 20 days   Shelly Coss, MD [facility] P2/07/2023, 11:28 AM

## 2022-12-22 DIAGNOSIS — I69391 Dysphagia following cerebral infarction: Secondary | ICD-10-CM | POA: Diagnosis not present

## 2022-12-22 NOTE — Congregational Nurse Program (Unsigned)
Visited client in hospital doing well with tube feeding wanting to go home.

## 2022-12-22 NOTE — Progress Notes (Signed)
PROGRESS NOTE  Kara Bailey  [id number] DOB: [birth year] DOA: 11/29/2022 PCP: Andree Moro, DO   Brief Narrative: Patient is a 67 year old female with history of CVA, hypertension presented the emergency department at the request of her PCP for the evaluation of persistent dysphagia.  Found to have multifactorial dysphagia likely from stroke.  Status post G-tube placement by IR.  PT/OT recommending SNF on discharge.  Developed nausea, vomiting, abdominal pain on 1/26,  CT abdomen/pelvis showed possible cholecystitis, MRCP confirmed acute cholecystitis.  General surgery was  following.  Significant improvement abdominal pain, nausea, vomiting, liver enzymes,abx course completed.  Diet started and she is tolerating.  Her insurance declined SNF so daughter has appealed, decision is pending She is medically stable for discharge to SNF whenever possible.  Assessment & Plan:  Principal Problem:   Dysphagia as late effect of cerebrovascular accident (CVA) Active Problems:   Essential hypertension   Left hemiplegia (HCC)   History of stroke   Dysphagia   Protein-calorie malnutrition, severe  Dysphagia: Likely secondary to residual effect of the ischemic stroke.  Has history of esophageal stricture last dilated on 2021.  GI consulted and was following.  Barium swallow done on 11/30/2022 was nondiagnostic.  Underwent G-tube placement by IR on 1/22.  Underwent MBS again  speech therapy to check if she can swallow orally.Started on full liquid diet. Continue tube feeding diet, full liquid diet.   Abdomen pain, nausea, vomiting/acute cholecystitis: Resolved.  Likely acalculous cholecystitis.  CT abdomen/pelvis, MRCP confirmed acute cholecystitis without stones.  Patient was complaining of   periumbilical abdominal pain but now it has resolved.  Abdomen remains soft, nondistended, nontender with good bowel sounds.   Liver enzymes were elevated, now normalized.  General surgery following signed off.  Abx  changed to Augmentin,now completed the course .  Diarrhea: She was having diarrhea thought to be from antibiotics and also tube feeding.  We requested nutritionist to change the tube diet.  Also using Lomotil round-the-clock.    Continue probiotics. Diarrhea now has slowed down  Severe protein calorie malnutrition: Nutrition was following.  BMI of 18.  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  On Plavix, aspirin, Lipitor, Zetia.  Also on baclofen and Zanaflex for left-sided hemiplegia and spasticity  Hypertension: Currently blood pressure soft/stable.  Not on any medications  Hypomagnesemia/hypokalemia: Continue magnesium supplementation.  Depression: On celexa  Normocytic anemia: No evidence of acute blood loss.  Currently hemoglobin stable in the range of 8-9  Debility/deconditioning: PT/OT recommending SNF on discharge.TOC following      Nutrition Problem: Severe Malnutrition Etiology: chronic illness, dysphagia (h/o CVA)    DVT prophylaxis:SCDs Start: 11/30/22 0151     Code Status: Full Code  Family Communication: Called daughter [name] on phone on 2/10  Patient status:Inpatient  Patient is from :Home  Anticipated discharge to:SnF  Estimated DC date: as soon as bed is available,denied by insurance but appeal submitted by family   Consultants: GI,IR,surgery  Procedures:G tube placement   Antimicrobials:  Anti-infectives (From admission, onward)    Start     Dose/Rate Route Frequency Ordered Stop   12/11/22 2200  amoxicillin-clavulanate (AUGMENTIN) 400-57 MG/5ML suspension 875 mg        875 mg Per Tube Every 12 hours 12/11/22 1137 12/18/22 1039   12/11/22 1000  amoxicillin-clavulanate (AUGMENTIN) 875-125 MG per tablet 1 tablet  Status:  Discontinued        1 tablet Oral Every 12 hours 12/11/22 0821  12/11/22 1137   12/08/22 2000  piperacillin-tazobactam (ZOSYN) IVPB 3.375 g  Status:  Discontinued         3.375 g 12.5 mL/hr over 240 Minutes Intravenous Every 8 hours 12/08/22 1900 12/11/22 0821   12/07/22 1130  cefadroxil (DURICEF) capsule 500 mg  Status:  Discontinued        500 mg Oral 2 times daily 12/07/22 1033 12/07/22 1033   12/07/22 1130  cefadroxil (DURICEF) capsule 500 mg  Status:  Discontinued        500 mg Oral 2 times daily 12/07/22 1033 12/07/22 1034   12/07/22 1130  cefadroxil (DURICEF) capsule 500 mg  Status:  Discontinued        500 mg Per Tube 2 times daily 12/07/22 1034 12/08/22 1903   12/03/22 0700  ceFAZolin (ANCEF) IVPB 2g/100 mL premix        2 g 200 mL/hr over 30 Minutes Intravenous To Radiology 11/30/22 1602 12/04/22 0700       Subjective: Patient seen and examined at bedside today.  Hemodynamically stable.  She just had a small loose bowel movement this morning.  Denies any abdomen pain, nausea or vomiting   Objective: Vitals:   12/21/22 0507 12/21/22 1303 12/21/22 2122 12/22/22 0544  BP: 127/76 (!) 121/56 125/70 106/67  Pulse: 84 81 99 87  Resp: 18 18 16 14  $ Temp: 98.2 F (36.8 C) 98.3 F (36.8 C) 98.2 F (36.8 C) 98 F (36.7 C)  TempSrc: Oral Oral Oral Oral  SpO2: 100% 100% 100% 99%  Weight:    52.7 kg  Height:        Intake/Output Summary (Last 24 hours) at 12/22/2022 1116 Last data filed at 12/22/2022 0557 Gross per 24 hour  Intake --  Output 625 ml  Net -625 ml   Filed Weights   12/17/22 0418 12/18/22 0509 12/22/22 0544  Weight: 49.4 kg 50.2 kg 52.7 kg    Examination:   General exam: Overall comfortable, not in distress, deconditioned, lying in bed HEENT: PERRL Respiratory system:  no wheezes or crackles  Cardiovascular system: S1 & S2 heard, RRR.  Gastrointestinal system: Abdomen is nondistended, soft and nontender.  G-tube Central nervous system: Alert and oriented, left hemiplegia Extremities: No edema, no clubbing ,no cyanosis Skin: No rashes, no ulcers,no icterus      Data Reviewed: I have personally reviewed following labs  and imaging studies  CBC: Recent Labs  Lab 12/17/22 0543 12/18/22 0527  WBC 10.1 10.2  NEUTROABS  --  6.0  HGB 9.0* 8.6*  HCT 29.3* 27.3*  MCV 92.4 91.3  PLT 416* 99991111    Basic Metabolic Panel: Recent Labs  Lab 12/15/22 1956 12/16/22 0637 12/17/22 0543 12/18/22 0527  NA 137 138 135 137  K 4.5 4.2 4.6 4.3  CL 105 105 105 104  CO2 26 24 23 27  $ GLUCOSE 105* 84 104* 104*  BUN 14 13 14 13  $ CREATININE 0.73 0.68 0.57 0.60  CALCIUM 8.9 8.9 8.9 8.8*  MG  --  2.2  --   --   PHOS  --  3.7  --   --      No results found for this or any previous visit (from the past 240 hour(s)).    Radiology Studies: No results found.  Scheduled Meds:  aspirin  81 mg Per Tube Daily   atorvastatin  80 mg Per Tube QPC supper   clopidogrel  75 mg Per Tube Daily   diclofenac Sodium  4  g Topical QID   diphenoxylate-atropine  1 tablet Per Tube QID   ezetimibe  10 mg Per Tube Daily   famotidine  10.4 mg Per Tube QHS   feeding supplement (JEVITY 1.5 CAL/FIBER)  1,000 mL Per Tube Q24H   feeding supplement (PROSource TF20)  60 mL Per Tube TID BM   free water  100 mL Per Tube Q4H   melatonin  3 mg Per Tube QHS   multivitamin with minerals  1 tablet Per Tube Daily   saccharomyces boulardii  250 mg Per Tube BID   thiamine  100 mg Per Tube Daily   tiZANidine  2 mg Per Tube BID   Continuous Infusions:     LOS: 21 days   Shelly Coss, MD [facility] P2/08/2023, 11:16 AM

## 2022-12-23 DIAGNOSIS — I69391 Dysphagia following cerebral infarction: Secondary | ICD-10-CM | POA: Diagnosis not present

## 2022-12-23 MED ORDER — MORPHINE SULFATE (PF) 2 MG/ML IV SOLN
2.0000 mg | INTRAVENOUS | Status: DC | PRN
  Administered 2022-12-23 – 2023-01-02 (×34): 2 mg via INTRAVENOUS
  Filled 2022-12-23 (×34): qty 1

## 2022-12-23 MED ORDER — OXYCODONE HCL 5 MG PO TABS
5.0000 mg | ORAL_TABLET | Freq: Four times a day (QID) | ORAL | Status: DC | PRN
  Administered 2022-12-23 – 2023-01-01 (×9): 5 mg via ORAL
  Filled 2022-12-23 (×9): qty 1

## 2022-12-23 NOTE — Progress Notes (Signed)
PROGRESS NOTE  Kara Bailey  [id number] DOB: [birth year] DOA: 11/29/2022 PCP: Andree Moro, DO   Brief Narrative: Patient is a 67 year old female with history of CVA, hypertension presented the emergency department at the request of her PCP for the evaluation of persistent dysphagia.  Found to have multifactorial dysphagia likely from stroke.  Status post G-tube placement by IR.  PT/OT recommending SNF on discharge.  Developed nausea, vomiting, abdominal pain on 1/26,  CT abdomen/pelvis showed possible cholecystitis, MRCP confirmed acute cholecystitis.  General surgery was  following.  Significant improvement abdominal pain, nausea, vomiting, liver enzymes,abx course completed.  Diet started and she is tolerating.  Her insurance declined SNF so daughter has appealed, decision is pending She is medically stable for discharge to SNF whenever possible.  Assessment & Plan:  Principal Problem:   Dysphagia as late effect of cerebrovascular accident (CVA) Active Problems:   Essential hypertension   Left hemiplegia (HCC)   History of stroke   Dysphagia   Protein-calorie malnutrition, severe  Dysphagia: Likely secondary to residual effect of the ischemic stroke.  Has history of esophageal stricture last dilated on 2021.  GI consulted and was following.  Barium swallow done on 11/30/2022 was nondiagnostic.  Underwent G-tube placement by IR on 1/22.  Underwent MBS again  speech therapy to check if she can swallow orally.Started on full liquid diet. Continue tube feeding diet, full liquid diet.   Abdomen pain, nausea, vomiting/acute cholecystitis: Resolved.  Likely acalculous cholecystitis.  CT abdomen/pelvis, MRCP confirmed acute cholecystitis without stones.  Patient was complaining of   periumbilical abdominal pain but now it has resolved.  Abdomen remains soft, nondistended, nontender with good bowel sounds.   Liver enzymes were elevated, now normalized.  General surgery following signed off.  Abx  changed to Augmentin,now completed the course .  Diarrhea: She was having diarrhea thought to be from antibiotics and also tube feeding.  We requested nutritionist to change the tube diet.  Also using Lomotil round-the-clock.    Continue probiotics. Again had diarrhea twice this morning, diarrhea is not that watery.  Will try to check GI pathogen panel  Severe protein calorie malnutrition: Nutrition was following.  BMI of 18.  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  On Plavix, aspirin, Lipitor, Zetia.  Also on baclofen and Zanaflex for left-sided hemiplegia and spasticity  Hypertension: Currently blood pressure soft/stable.  Not on any medications  Hypomagnesemia/hypokalemia: Continue magnesium supplementation.  Depression: On celexa  Normocytic anemia: No evidence of acute blood loss.  Currently hemoglobin stable in the range of 8-9  Debility/deconditioning: PT/OT recommending SNF on discharge.TOC following      Nutrition Problem: Severe Malnutrition Etiology: chronic illness, dysphagia (h/o CVA)    DVT prophylaxis:SCDs Start: 11/30/22 0151     Code Status: Full Code  Family Communication: Called daughter [name] on phone on 2/10  Patient status:Inpatient  Patient is from :Home  Anticipated discharge to:SnF  Estimated DC date: as soon as bed is available,denied by insurance but appeal submitted by family   Consultants: GI,IR,surgery  Procedures:G tube placement   Antimicrobials:  Anti-infectives (From admission, onward)    Start     Dose/Rate Route Frequency Ordered Stop   12/11/22 2200  amoxicillin-clavulanate (AUGMENTIN) 400-57 MG/5ML suspension 875 mg        875 mg Per Tube Every 12 hours 12/11/22 1137 12/18/22 1039   12/11/22 1000  amoxicillin-clavulanate (AUGMENTIN) 875-125 MG per tablet 1 tablet  Status:  Discontinued  1 tablet Oral Every 12 hours 12/11/22 0821 12/11/22 1137   12/08/22 2000   piperacillin-tazobactam (ZOSYN) IVPB 3.375 g  Status:  Discontinued        3.375 g 12.5 mL/hr over 240 Minutes Intravenous Every 8 hours 12/08/22 1900 12/11/22 0821   12/07/22 1130  cefadroxil (DURICEF) capsule 500 mg  Status:  Discontinued        500 mg Oral 2 times daily 12/07/22 1033 12/07/22 1033   12/07/22 1130  cefadroxil (DURICEF) capsule 500 mg  Status:  Discontinued        500 mg Oral 2 times daily 12/07/22 1033 12/07/22 1034   12/07/22 1130  cefadroxil (DURICEF) capsule 500 mg  Status:  Discontinued        500 mg Per Tube 2 times daily 12/07/22 1034 12/08/22 1903   12/03/22 0700  ceFAZolin (ANCEF) IVPB 2g/100 mL premix        2 g 200 mL/hr over 30 Minutes Intravenous To Radiology 11/30/22 1602 12/04/22 0700       Subjective: Patient seen and examined at bedside today.  Hemodynamically stable.  Denies any abdominal pain nausea or vomiting but had 2 episodes of diarrhea this morning.  Overall looks comfortable   Objective: Vitals:   12/21/22 2122 12/22/22 0544 12/22/22 1351 12/22/22 2113  BP: 125/70 106/67 119/64 123/74  Pulse: 99 87 95 (!) 104  Resp: 16 14 18 18  $ Temp: 98.2 F (36.8 C) 98 F (36.7 C) 98.8 F (37.1 C) 98.6 F (37 C)  TempSrc: Oral Oral Oral Oral  SpO2: 100% 99% 95% 100%  Weight:  52.7 kg  51.1 kg  Height:        Intake/Output Summary (Last 24 hours) at 12/23/2022 1131 Last data filed at 12/23/2022 0548 Gross per 24 hour  Intake --  Output 300 ml  Net -300 ml   Filed Weights   12/18/22 0509 12/22/22 0544 12/22/22 2113  Weight: 50.2 kg 52.7 kg 51.1 kg    Examination:   General exam: Overall comfortable, not in distress, deconditioned HEENT: PERRL Respiratory system:  no wheezes or crackles  Cardiovascular system: S1 & S2 heard, RRR.  Gastrointestinal system: Abdomen is nondistended, soft and nontender.  G-tube Central nervous system: Alert and oriented, left-sided weakness Extremities: No edema, no clubbing ,no cyanosis Skin: No rashes,  no ulcers,no icterus     Data Reviewed: I have personally reviewed following labs and imaging studies  CBC: Recent Labs  Lab 12/17/22 0543 12/18/22 0527  WBC 10.1 10.2  NEUTROABS  --  6.0  HGB 9.0* 8.6*  HCT 29.3* 27.3*  MCV 92.4 91.3  PLT 416* 99991111    Basic Metabolic Panel: Recent Labs  Lab 12/17/22 0543 12/18/22 0527  NA 135 137  K 4.6 4.3  CL 105 104  CO2 23 27  GLUCOSE 104* 104*  BUN 14 13  CREATININE 0.57 0.60  CALCIUM 8.9 8.8*     No results found for this or any previous visit (from the past 240 hour(s)).    Radiology Studies: No results found.  Scheduled Meds:  aspirin  81 mg Per Tube Daily   atorvastatin  80 mg Per Tube QPC supper   clopidogrel  75 mg Per Tube Daily   diclofenac Sodium  4 g Topical QID   diphenoxylate-atropine  1 tablet Per Tube QID   ezetimibe  10 mg Per Tube Daily   famotidine  10.4 mg Per Tube QHS   feeding supplement (JEVITY 1.5 CAL/FIBER)  1,000 mL Per Tube Q24H   feeding supplement (PROSource TF20)  60 mL Per Tube TID BM   free water  100 mL Per Tube Q4H   melatonin  3 mg Per Tube QHS   multivitamin with minerals  1 tablet Per Tube Daily   saccharomyces boulardii  250 mg Per Tube BID   thiamine  100 mg Per Tube Daily   tiZANidine  2 mg Per Tube BID   Continuous Infusions:     LOS: 22 days   Shelly Coss, MD [facility] P2/09/2023, 11:31 AM

## 2022-12-24 ENCOUNTER — Inpatient Hospital Stay (HOSPITAL_COMMUNITY): Payer: Medicare HMO

## 2022-12-24 DIAGNOSIS — I69391 Dysphagia following cerebral infarction: Secondary | ICD-10-CM | POA: Diagnosis not present

## 2022-12-24 LAB — GASTROINTESTINAL PANEL BY PCR, STOOL (REPLACES STOOL CULTURE)

## 2022-12-24 MED ORDER — IOHEXOL 300 MG/ML  SOLN: 100.0000 mL | Freq: Once | INTRAMUSCULAR | Status: DC | PRN

## 2022-12-24 MED ORDER — IOHEXOL 9 MG/ML PO SOLN
ORAL | Status: AC
  Filled 2022-12-24: qty 1000

## 2022-12-24 MED ORDER — IOHEXOL 300 MG/ML  SOLN
100.0000 mL | Freq: Once | INTRAMUSCULAR | Status: AC | PRN
  Administered 2022-12-24: 100 mL via INTRAVENOUS

## 2022-12-24 MED ORDER — SODIUM CHLORIDE (PF) 0.9 % IJ SOLN
INTRAMUSCULAR | Status: AC
  Filled 2022-12-24: qty 50

## 2022-12-24 MED ORDER — IOHEXOL 9 MG/ML PO SOLN
500.0000 mL | ORAL | Status: AC
  Administered 2022-12-24 (×2): 500 mL via ORAL

## 2022-12-24 NOTE — Plan of Care (Signed)
  Problem: Education: Goal: Knowledge of General Education information will improve Description: Including pain rating scale, medication(s)/side effects and non-pharmacologic comfort measures Outcome: Progressing   Problem: Health Behavior/Discharge Planning: Goal: Ability to manage health-related needs will improve Outcome: Progressing   Problem: Clinical Measurements: Goal: Ability to maintain clinical measurements within normal limits will improve Outcome: Progressing   Problem: Activity: Goal: Risk for activity intolerance will decrease Outcome: Progressing   Problem: Coping: Goal: Level of anxiety will decrease Outcome: Progressing   Problem: Elimination: Goal: Will not experience complications related to bowel motility Outcome: Progressing   Problem: Safety: Goal: Ability to remain free from injury will improve Outcome: Progressing   Problem: Skin Integrity: Goal: Risk for impaired skin integrity will decrease Outcome: Progressing

## 2022-12-24 NOTE — Progress Notes (Signed)
PT Cancellation Note  Patient Details Name: Kara Bailey MRN: [id number] DOB: 11/08/1956   Cancelled Treatment:    Reason Eval/Treat Not Completed: Patient at procedure or test/unavailable. Per nursing, pt just taken to CT. Will continue PT efforts.    Talbot Grumbling PT, DPT 12/24/22, 3:17 PM

## 2022-12-24 NOTE — TOC Progression Note (Addendum)
Transition of Care [facility]) - Progression Note    Patient Details  Name: Kara Bailey MRN: [id number] Date of Birth: [birth year]  Transition of [facility] CM/SW Wilder, RN Phone Number:[phone]  12/24/2022, 10:44 AM  Clinical Narrative:    CM attempted to follow up on status  of appeal. CM called [phone] option 0 per previous note. CM spoke with representative that states that AOR form has not been received appeal status is still in progress. CM has inquired about this form in order to assist the family in the appeal process but representative does not explain what the form is but states that it is on the website and that family  would need to have submitted this with the appeal. Cm has reached out to [facility] supervisor for advice on locating this form. Call reference # [id number] , Appeal case # [id number].   [id number] CM spoke with [name] in admissions at [facility]. [name] has assisted CM in locating the Appointment of Representative form on the [employer]. CM has printed form and prefilled out all that CM is able to completed. CM called daughter [name] to update her. There is no answer voicemail has been left and forms have been placed on chart for daughter to completed. TOC will continue to follow.   1300 Daughter [name] returns call and states that she has emailed the appointment of representative letter to [facility].   Expected Discharge Plan: Skilled Nursing Facility Barriers to Discharge: SNF Authorization Denied  Expected Discharge Plan and Services In-house Referral: NA Discharge Planning Services: CM Consult Post Acute Care Choice: [place] Living arrangements for the past 2 months: Apartment Expected Discharge Date: 12/07/22               DME Arranged: N/A DME Agency: NA       HH Arranged: NA HH Agency: NA         Social Determinants of Health (SDOH) Interventions SDOH Screenings   Food Insecurity: Unknown (12/04/2022)   Housing: Low Risk  (12/04/2022)  Transportation Needs: Unknown (12/04/2022)  Utilities: Unknown (12/04/2022)  Depression (PHQ2-9): High Risk (09/19/2022)  Tobacco Use: High Risk (12/03/2022)    Readmission Risk Interventions    12/17/2022    1:42 PM  Readmission Risk Prevention Plan  Transportation Screening Complete  PCP or Specialist Appt within 3-5 Days Complete  HRI or [place] Complete  Social Work Consult for [place] Planning/Counseling [place] Not Applicable  Medication Review Press photographer) Referral to Pharmacy

## 2022-12-24 NOTE — Progress Notes (Signed)
PROGRESS NOTE  Kara Bailey  [id number] DOB: 26-Nov-1955 DOA: 11/29/2022 PCP: Andree Moro, DO   Brief Narrative: Patient is a 67 year old female with history of CVA, hypertension presented the emergency department at the request of her PCP for the evaluation of persistent dysphagia.  Found to have multifactorial dysphagia likely from stroke.  Status post G-tube placement by IR.  PT/OT recommending SNF on discharge.  Developed nausea, vomiting, abdominal pain on 1/26,  CT abdomen/pelvis showed possible cholecystitis, MRCP confirmed acute cholecystitis.  General surgery was  following.  Significant improvement abdominal pain, nausea, vomiting, liver enzymes,abx course completed.  Diet started and she is tolerating.  Her insurance declined SNF so daughter has appealed, decision is pending She is medically stable for discharge to SNF whenever possible.  Assessment & Plan:  Principal Problem:   Dysphagia as late effect of cerebrovascular accident (CVA) Active Problems:   Essential hypertension   Left hemiplegia (HCC)   History of stroke   Dysphagia   Protein-calorie malnutrition, severe  Dysphagia: Likely secondary to residual effect of the ischemic stroke.  Has history of esophageal stricture last dilated on 2021.  GI consulted and was following.  Barium swallow done on 11/30/2022 was nondiagnostic.  Underwent G-tube placement by IR on 1/22.  Underwent MBS again  speech therapy to check if she can swallow orally.Started on full liquid diet. Continue tube feeding diet, full liquid diet.   Abdomen pain, nausea, vomiting/acute cholecystitis: Resolved.  Likely acalculous cholecystitis.  CT abdomen/pelvis, MRCP confirmed acute cholecystitis without stones.  Patient was complaining of   periumbilical abdominal pain . Abdomen remains soft, nondistended, nontender with good bowel sounds.   Liver enzymes were elevated, now normalized.  General surgery following signed off.  Abx changed to  Augmentin,now completed the course .  Diarrhea: She was having diarrhea thought to be from antibiotics and also tube feeding.  We requested nutritionist to change the tube diet.  Also using Lomotil round-the-clock.    Continue probiotics. GI pathogen panel negative.  Continues to have intermittent diarrhea along with intermittent abdominal pain.  Checking CT abdomen/pelvis with contrast   Severe protein calorie malnutrition: Nutrition was following.  BMI of 18.  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  On Plavix, aspirin, Lipitor, Zetia.  Also on baclofen and Zanaflex for left-sided hemiplegia and spasticity  Hypertension: Currently blood pressure soft/stable.  Not on any medications  Hypomagnesemia/hypokalemia: Continue magnesium supplementation.  Depression: On celexa  Normocytic anemia: No evidence of acute blood loss.  Currently hemoglobin stable in the range of 8-9  Debility/deconditioning: PT/OT recommending SNF on discharge.TOC following.  Insurance declined, family has appealed      Nutrition Problem: Severe Malnutrition Etiology: chronic illness, dysphagia (h/o CVA)    DVT prophylaxis:SCDs Start: 11/30/22 0151     Code Status: Full Code  Family Communication: Called daughter [name] on phone on 2/10  Patient status:Inpatient  Patient is from :Home  Anticipated discharge to:SnF  Estimated DC date: as soon as bed is available,denied by insurance but appeal submitted by family   Consultants: GI,IR,surgery  Procedures:G tube placement   Antimicrobials:  Anti-infectives (From admission, onward)    Start     Dose/Rate Route Frequency Ordered Stop   12/11/22 2200  amoxicillin-clavulanate (AUGMENTIN) 400-57 MG/5ML suspension 875 mg        875 mg Per Tube Every 12 hours 12/11/22 1137 12/18/22 1039   12/11/22 1000  amoxicillin-clavulanate (AUGMENTIN) 875-125 MG per tablet 1 tablet  Status:  Discontinued         1 tablet Oral Every 12 hours 12/11/22 0821 12/11/22 1137   12/08/22 2000  piperacillin-tazobactam (ZOSYN) IVPB 3.375 g  Status:  Discontinued        3.375 g 12.5 mL/hr over 240 Minutes Intravenous Every 8 hours 12/08/22 1900 12/11/22 0821   12/07/22 1130  cefadroxil (DURICEF) capsule 500 mg  Status:  Discontinued        500 mg Oral 2 times daily 12/07/22 1033 12/07/22 1033   12/07/22 1130  cefadroxil (DURICEF) capsule 500 mg  Status:  Discontinued        500 mg Oral 2 times daily 12/07/22 1033 12/07/22 1034   12/07/22 1130  cefadroxil (DURICEF) capsule 500 mg  Status:  Discontinued        500 mg Per Tube 2 times daily 12/07/22 1034 12/08/22 1903   12/03/22 0700  ceFAZolin (ANCEF) IVPB 2g/100 mL premix        2 g 200 mL/hr over 30 Minutes Intravenous To Radiology 11/30/22 1602 12/04/22 0700       Subjective: Patient seen and examined at bedside today.  Hemodynamically stable.  Had 4 episodes of diarrhea since yesterday.  Also having some intermittent lower abdominal pain.  We discussed about doing a CT scan  Objective: Vitals:   12/23/22 0536 12/23/22 1327 12/23/22 2149 12/24/22 0526  BP: 117/62 113/65 125/72 (!) 107/49  Pulse: 98 (!) 103 (!) 103 (!) 104  Resp: 16  16 18  $ Temp: 97.8 F (36.6 C) 98.3 F (36.8 C) 99.1 F (37.3 C) 97.8 F (36.6 C)  TempSrc: Oral Oral Oral Oral  SpO2: 100% 100% 97% 99%  Weight:    52.4 kg  Height:        Intake/Output Summary (Last 24 hours) at 12/24/2022 1257 Last data filed at 12/24/2022 1018 Gross per 24 hour  Intake 420 ml  Output --  Net 420 ml   Filed Weights   12/22/22 0544 12/22/22 2113 12/24/22 0526  Weight: 52.7 kg 51.1 kg 52.4 kg    Examination:   General exam: Overall comfortable, not in distress, deconditioned, weak HEENT: PERRL Respiratory system:  no wheezes or crackles  Cardiovascular system: S1 & S2 heard, RRR.  Gastrointestinal system: Abdomen is nondistended, soft and nontender.  G-tube Central nervous system:  Alert and oriented, left hemiplegia Extremities: No edema, no clubbing ,no cyanosis Skin: No rashes, no ulcers,no icterus     Data Reviewed: I have personally reviewed following labs and imaging studies  CBC: Recent Labs  Lab 12/18/22 0527  WBC 10.2  NEUTROABS 6.0  HGB 8.6*  HCT 27.3*  MCV 91.3  PLT 99991111    Basic Metabolic Panel: Recent Labs  Lab 12/18/22 0527  NA 137  K 4.3  CL 104  CO2 27  GLUCOSE 104*  BUN 13  CREATININE 0.60  CALCIUM 8.8*     Recent Results (from the past 240 hour(s))  Gastrointestinal Panel by PCR , Stool     Status: None   Collection Time: 12/23/22 11:33 AM   Specimen: Stool  Result Value Ref Range Status   Campylobacter species NOT DETECTED NOT DETECTED Final   Plesimonas shigelloides NOT DETECTED NOT DETECTED Final   Salmonella species NOT DETECTED NOT DETECTED Final   Yersinia enterocolitica NOT DETECTED NOT DETECTED Final   Vibrio species NOT DETECTED NOT DETECTED Final   Vibrio cholerae NOT DETECTED NOT DETECTED Final   Enteroaggregative E coli (EAEC) NOT DETECTED NOT  DETECTED Final   Enteropathogenic E coli (EPEC) NOT DETECTED NOT DETECTED Final   Enterotoxigenic E coli (ETEC) NOT DETECTED NOT DETECTED Final   Shiga like toxin producing E coli (STEC) NOT DETECTED NOT DETECTED Final   Shigella/Enteroinvasive E coli (EIEC) NOT DETECTED NOT DETECTED Final   Cryptosporidium NOT DETECTED NOT DETECTED Final   Cyclospora cayetanensis NOT DETECTED NOT DETECTED Final   Entamoeba histolytica NOT DETECTED NOT DETECTED Final   Giardia lamblia NOT DETECTED NOT DETECTED Final   Adenovirus F40/41 NOT DETECTED NOT DETECTED Final   Astrovirus NOT DETECTED NOT DETECTED Final   Norovirus GI/GII NOT DETECTED NOT DETECTED Final   Rotavirus A NOT DETECTED NOT DETECTED Final   Sapovirus (I, II, IV, and V) NOT DETECTED NOT DETECTED Final    Comment: Performed at [facility], [address], [place], [place] [address]      Radiology  Studies: No results found.  Scheduled Meds:  aspirin  81 mg Per Tube Daily   atorvastatin  80 mg Per Tube QPC supper   clopidogrel  75 mg Per Tube Daily   diclofenac Sodium  4 g Topical QID   diphenoxylate-atropine  1 tablet Per Tube QID   ezetimibe  10 mg Per Tube Daily   famotidine  10.4 mg Per Tube QHS   feeding supplement (JEVITY 1.5 CAL/FIBER)  1,000 mL Per Tube Q24H   feeding supplement (PROSource TF20)  60 mL Per Tube TID BM   free water  100 mL Per Tube Q4H   iohexol       melatonin  3 mg Per Tube QHS   multivitamin with minerals  1 tablet Per Tube Daily   saccharomyces boulardii  250 mg Per Tube BID   thiamine  100 mg Per Tube Daily   tiZANidine  2 mg Per Tube BID   Continuous Infusions:     LOS: 23 days   Shelly Coss, MD [facility] P2/10/2023, 12:57 PM

## 2022-12-24 NOTE — Progress Notes (Signed)
Chaplain engaged in a follow-up visit with Kara Bailey.  Kara Bailey became tearful as she expressed wanting to get out of the hospital.  She even vocalized wanting to just die.  Chaplain worked to normalize her emotions around being hospitalized for a long period of time and how that can impact her emotionally and mentally.  Chaplain could assess her frustration and worked to provide listening, time spent with each other, and engage other members giving her care. Kara Bailey has struggled to feel seen, heard, and cared for, and desires to see her children more.  She tearfully expressed that she isn't loved.  Chaplain could assess Kara Bailey desire to have her children at her bedside.  Chaplain offered support, a compassionate presence, and was able to speak to family about her emotions and feelings.   Chaplain Naria Abbey, MDiv  12/24/22 1400  Spiritual Encounters  Type of Visit Follow up  Care provided to: Pt and family  Referral source Nurse (RN/NT/LPN);Family  Spiritual Framework  Presenting Themes Significant life change;Coping tools;Impactful experiences and emotions  Community/Connection Family  Patient Stress Factors Family relationships;Exhausted;Major life changes;Health changes  Interventions  Spiritual Care Interventions Made Reflective listening;Normalization of emotions;Established relationship of care and support;Compassionate presence  Intervention Outcomes  Outcomes Connection to spiritual care;Awareness around self/spiritual resourses;Connection to values and goals of care;Awareness of support

## 2022-12-25 DIAGNOSIS — I69391 Dysphagia following cerebral infarction: Secondary | ICD-10-CM | POA: Diagnosis not present

## 2022-12-25 LAB — IRON AND TIBC
Iron: 52 ug/dL (ref 28–170)
Saturation Ratios: 15 % (ref 10.4–31.8)
TIBC: 344 ug/dL (ref 250–450)
UIBC: 292 ug/dL

## 2022-12-25 LAB — BASIC METABOLIC PANEL
Anion gap: 8 (ref 5–15)
BUN: 26 mg/dL — ABNORMAL HIGH (ref 8–23)
CO2: 26 mmol/L (ref 22–32)
Calcium: 8.9 mg/dL (ref 8.9–10.3)
Chloride: 105 mmol/L (ref 98–111)
Creatinine, Ser: 0.58 mg/dL (ref 0.44–1.00)
GFR, Estimated: >60 mL/min (ref >60)
Glucose, Bld: 119 mg/dL — ABNORMAL HIGH (ref 70–99)
Potassium: 3.7 mmol/L (ref 3.5–5.1)
Sodium: 139 mmol/L (ref 135–145)

## 2022-12-25 LAB — CBC
HCT: 24.6 % — ABNORMAL LOW (ref 36.0–46.0)
Hemoglobin: 7.3 g/dL — ABNORMAL LOW (ref 12.0–15.0)
MCH: 28.5 pg (ref 26.0–34.0)
MCHC: 29.7 g/dL — ABNORMAL LOW (ref 30.0–36.0)
MCV: 96.1 fL (ref 80.0–100.0)
Platelets: 247 10*3/uL (ref 150–400)
RBC: 2.56 MIL/uL — ABNORMAL LOW (ref 3.87–5.11)
RDW: 18.7 % — ABNORMAL HIGH (ref 11.5–15.5)
WBC: 10.2 10*3/uL (ref 4.0–10.5)
nRBC: 0 % (ref 0.0–0.2)

## 2022-12-25 MED ORDER — CITALOPRAM HYDROBROMIDE 20 MG PO TABS
10.0000 mg | ORAL_TABLET | Freq: Every morning | ORAL | Status: DC
  Administered 2022-12-25 – 2023-01-08 (×15): 10 mg via ORAL
  Filled 2022-12-25 (×15): qty 1

## 2022-12-25 MED ORDER — LOPERAMIDE HCL 2 MG PO CAPS
2.0000 mg | ORAL_CAPSULE | Freq: Three times a day (TID) | ORAL | Status: DC
  Administered 2022-12-25 – 2022-12-27 (×7): 2 mg via ORAL
  Filled 2022-12-25 (×7): qty 1

## 2022-12-25 NOTE — Progress Notes (Signed)
Occupational Therapy Treatment Patient Details Name: Kara Bailey MRN: [id number] DOB: 1956-10-20 Today's Date: 12/25/2022   History of present illness Patient is 67 year old female admitted 11/29/22 with multifactorial dysphagia; s/p PEG tube 12/03/22.  Pt with hx of CVA with L hemiparesis, HTN, iliac stent, hep C, GERD   OT comments  Patient continues to have pain in L shoulder impacting participation in splint use and comfort when rolling in bed. Patient was educated on LUE positioning in bed with min A to achieve when lying on L side. Patients NT educated on how to assist patient into this position. NT verbalized understanding. Session was limited with patients boyfriend present for a visit per patient request. Patient's discharge plan remains appropriate at this time. OT will continue to follow acutely.     Recommendations for follow up therapy are one component of a multi-disciplinary discharge planning process, led by the attending physician.  Recommendations may be updated based on patient status, additional functional criteria and insurance authorization.    Follow Up Recommendations  Skilled nursing-short term rehab (<3 hours/day)     Assistance Recommended at Discharge Frequent or constant Supervision/Assistance  Patient can return home with the following  A lot of help with bathing/dressing/bathroom;Assistance with cooking/housework;Direct supervision/assist for medications management;Assist for transportation;Help with stairs or ramp for entrance;A lot of help with walking and/or transfers   Equipment Recommendations  None recommended by OT       Precautions / Restrictions Precautions Precautions: Fall Precaution Comments: hx of CVA last year with L sided weakness; L knee buckles Restrictions Weight Bearing Restrictions: No       Mobility Bed Mobility     Rolling: Min assist (with education on proper positioning of LUE when rolling to L side.)                        ADL either performed or assessed with clinical judgement   ADL Overall ADL's : Needs assistance/impaired         General ADL Comments: patient was in bed reporting that both splints make her too warm and declining to wear them today. patient reproted the longest she has worn the blue brace was 3 hours at one time. patient was educated on importance of braces and using them. patient verbalized understanding. temperature in room attempted to be adjusted but patient declined indicating that she had a fan to keep her cool. patient was educated on rolling in bed onto scapular surface when rolling to the L side to prevent increased pain with sidelying for hygiene tasks with nursing staff. patient needed min A to get shoulder into proper position. NT was educated on proper positioning of L shoulder for movements. NT verbalized understanding. NT was educated on subluxations and importance of protecting L shoudler. patients NT verbalized understanding. patients boyfriend present for part of session with patient eager to have visit with him. session ended per patient request.      Cognition Arousal/Alertness: Awake/alert Behavior During Therapy: WFL for tasks assessed/performed Overall Cognitive Status: Within Functional Limits for tasks assessed         General Comments: patient was happy at end of session wtih boyfriend present to visit her                   Pertinent Vitals/ Pain       Pain Assessment Pain Assessment: Faces Faces Pain Scale: Hurts little more Pain Location: L shoulder with all movement Pain Descriptors /  Indicators: Grimacing, Guarding Pain Intervention(s): Monitored during session, Repositioned         Frequency  Min 2X/week        Progress Toward Goals  OT Goals(current goals can now be found in the care plan section)  Progress towards OT goals: Progressing toward goals     Plan Discharge plan remains appropriate       AM-PAC OT "6  Clicks" Daily Activity     Outcome Measure   Help from another person eating meals?: A Little Help from another person taking care of personal grooming?: A Little Help from another person toileting, which includes using toliet, bedpan, or urinal?: A Lot Help from another person bathing (including washing, rinsing, drying)?: A Lot Help from another person to put on and taking off regular upper body clothing?: A Lot Help from another person to put on and taking off regular lower body clothing?: A Lot 6 Click Score: 14    End of Session    OT Visit Diagnosis: Unsteadiness on feet (R26.81);Muscle weakness (generalized) (M62.81);Pain;Hemiplegia and hemiparesis Hemiplegia - Right/Left: Left Hemiplegia - dominant/non-dominant: Dominant Hemiplegia - caused by: Cerebral infarction Pain - Right/Left: Left Pain - part of body: Arm   Activity Tolerance Patient tolerated treatment well   Patient Left in bed;with call bell/phone within reach;with family/visitor present           Time: 1430-1441 OT Time Calculation (min): 11 min  Charges: OT General Charges $OT Visit: 1 Visit OT Treatments $Therapeutic Activity: 8-22 mins  Rennie Plowman, MS Acute Rehabilitation Department Office# [phone]   Willa Rough 12/25/2022, 4:07 PM

## 2022-12-25 NOTE — Progress Notes (Addendum)
PROGRESS NOTE  Kara Bailey  [id number] DOB: 1956-03-30 DOA: 11/29/2022 PCP: Andree Moro, DO   Brief Narrative: Patient is a 67 year old female with history of CVA, hypertension presented the emergency department at the request of her PCP for the evaluation of persistent dysphagia.  Found to have multifactorial dysphagia likely from stroke.  Status post G-tube placement by IR.  PT/OT recommending SNF on discharge.  Developed nausea, vomiting, abdominal pain on 1/26,  CT abdomen/pelvis showed possible cholecystitis, MRCP confirmed acute cholecystitis.  General surgery was  following.  Significant improvement abdominal pain, nausea, vomiting, liver enzymes,abx course completed.  Diet started and she is tolerating.  Her insurance declined SNF so daughter has appealed, decision is pending.  Hospital course remarkable for persistent diarrhea. She is medically stable for discharge to SNF whenever possible.  Assessment & Plan:  Principal Problem:   Dysphagia as late effect of cerebrovascular accident (CVA) Active Problems:   Essential hypertension   Left hemiplegia (HCC)   History of stroke   Dysphagia   Protein-calorie malnutrition, severe  Dysphagia: Likely secondary to residual effect of the ischemic stroke.  Has history of esophageal stricture last dilated on 2021.  GI consulted and was following.  Barium swallow done on 11/30/2022 was nondiagnostic.  Underwent G-tube placement by IR on 1/22.  Underwent MBS again  speech therapy to check if she can swallow orally.Started on full liquid diet. Continue tube feeding diet, full liquid diet.   Abdomen pain, nausea, vomiting/acute cholecystitis: Resolved.  Likely acalculous cholecystitis.  CT abdomen/pelvis, MRCP confirmed acute cholecystitis without stones.  Patient was complaining of   periumbilical abdominal pain . Abdomen remains soft, nondistended, nontender with good bowel sounds.   Liver enzymes were elevated, now normalized.  General  surgery following signed off.  Abx changed to Augmentin,now completed the course .  Diarrhea: She was having diarrhea thought to be from antibiotics and also tube feeding.  We requested nutritionist to change the tube diet.  Also using Lomotil round-the-clock.    Continue probiotics. GI pathogen panel negative.  Continues to have intermittent diarrhea along with intermittent abdominal pain.   CT abdomen/pelvis with contrast did not show any bowel inflammation or any other etiology to explain abdominal pain.  Tried to check C. Difficile but not approved by Dr Johnnye Sima.  Using both Imodium and Lomotil, probiotics  Severe protein calorie malnutrition: Nutrition was following.  BMI of 18.  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  On Plavix, aspirin, Lipitor, Zetia.  Also on baclofen and Zanaflex for left-sided hemiplegia and spasticity  Hypertension: Currently blood pressure soft/stable.  Not on any medications  Hypomagnesemia/hypokalemia: Continue magnesium supplementation.  Depression: On celexa  Normocytic anemia: No evidence of acute blood loss.  Currently hemoglobin stable in the range of 8-9  Debility/deconditioning: PT/OT recommending SNF on discharge.TOC following.  Insurance declined, family has appealed      Nutrition Problem: Severe Malnutrition Etiology: chronic illness, dysphagia (h/o CVA)    DVT prophylaxis:SCDs Start: 11/30/22 0151     Code Status: Full Code  Family Communication: Called daughter [name] on phone on 2/10  Patient status:Inpatient  Patient is from :Home  Anticipated discharge to:SnF  Estimated DC date: as soon as bed is available,denied by insurance but appeal submitted by family   Consultants: GI,IR,surgery  Procedures:G tube placement   Antimicrobials:  Anti-infectives (From admission, onward)    Start     Dose/Rate Route Frequency Ordered Stop   12/11/22 2200  amoxicillin-clavulanate (AUGMENTIN) 400-57 MG/5ML suspension 875 mg        875 mg Per Tube Every 12 hours 12/11/22 1137 12/18/22 1039   12/11/22 1000  amoxicillin-clavulanate (AUGMENTIN) 875-125 MG per tablet 1 tablet  Status:  Discontinued        1 tablet Oral Every 12 hours 12/11/22 0821 12/11/22 1137   12/08/22 2000  piperacillin-tazobactam (ZOSYN) IVPB 3.375 g  Status:  Discontinued        3.375 g 12.5 mL/hr over 240 Minutes Intravenous Every 8 hours 12/08/22 1900 12/11/22 0821   12/07/22 1130  cefadroxil (DURICEF) capsule 500 mg  Status:  Discontinued        500 mg Oral 2 times daily 12/07/22 1033 12/07/22 1033   12/07/22 1130  cefadroxil (DURICEF) capsule 500 mg  Status:  Discontinued        500 mg Oral 2 times daily 12/07/22 1033 12/07/22 1034   12/07/22 1130  cefadroxil (DURICEF) capsule 500 mg  Status:  Discontinued        500 mg Per Tube 2 times daily 12/07/22 1034 12/08/22 1903   12/03/22 0700  ceFAZolin (ANCEF) IVPB 2g/100 mL premix        2 g 200 mL/hr over 30 Minutes Intravenous To Radiology 11/30/22 1602 12/04/22 0700       Subjective: Patient seen and examined at bedside today.  Hemodynamically stable.  Overall looks comfortable.  Denies any abdomen pain this morning ,no nausea and vomiting but had 4 episodes of diarrhea last night and 1 episode this morning.  Objective: Vitals:   12/24/22 0526 12/24/22 1448 12/24/22 2114 12/25/22 0518  BP: (!) 107/49 132/83 125/75 120/69  Pulse: (!) 104 (!) 110 (!) 110 (!) 102  Resp: 18 18 16 16  $ Temp: 97.8 F (36.6 C) 98.6 F (37 C) 99.1 F (37.3 C) 98.2 F (36.8 C)  TempSrc: Oral Oral Oral Oral  SpO2: 99% 100% 100% 100%  Weight: 52.4 kg   52.7 kg  Height:        Intake/Output Summary (Last 24 hours) at 12/25/2022 1139 Last data filed at 12/25/2022 0847 Gross per 24 hour  Intake 0 ml  Output 1 ml  Net -1 ml   Filed Weights   12/22/22 2113 12/24/22 0526 12/25/22 0518  Weight: 51.1 kg 52.4 kg 52.7 kg     Examination:  General exam: Overall comfortable, not in distress, deconditioned, weak HEENT: PERRL Respiratory system:  no wheezes or crackles  Cardiovascular system: S1 & S2 heard, RRR.  Gastrointestinal system: Abdomen is nondistended, soft and nontender.  G-tube Central nervous system: Alert and oriented, left-sided hemiplegia Extremities: No edema, no clubbing ,no cyanosis Skin: No rashes, no ulcers,no icterus    Data Reviewed: I have personally reviewed following labs and imaging studies  CBC: Recent Labs  Lab 12/25/22 0607  WBC 10.2  HGB 7.3*  HCT 24.6*  MCV 96.1  PLT [id number]    Basic Metabolic Panel: Recent Labs  Lab 12/25/22 0607  NA 139  K 3.7  CL 105  CO2 26  GLUCOSE 119*  BUN 26*  CREATININE 0.58  CALCIUM 8.9     Recent Results (from the past 240 hour(s))  Gastrointestinal Panel by PCR , Stool     Status: None   Collection Time: 12/23/22 11:33 AM   Specimen: Stool  Result Value Ref Range Status   Campylobacter species NOT DETECTED NOT DETECTED Final   Plesimonas shigelloides NOT DETECTED NOT DETECTED Final   Salmonella species NOT  DETECTED NOT DETECTED Final   Yersinia enterocolitica NOT DETECTED NOT DETECTED Final   Vibrio species NOT DETECTED NOT DETECTED Final   Vibrio cholerae NOT DETECTED NOT DETECTED Final   Enteroaggregative E coli (EAEC) NOT DETECTED NOT DETECTED Final   Enteropathogenic E coli (EPEC) NOT DETECTED NOT DETECTED Final   Enterotoxigenic E coli (ETEC) NOT DETECTED NOT DETECTED Final   Shiga like toxin producing E coli (STEC) NOT DETECTED NOT DETECTED Final   Shigella/Enteroinvasive E coli (EIEC) NOT DETECTED NOT DETECTED Final   Cryptosporidium NOT DETECTED NOT DETECTED Final   Cyclospora cayetanensis NOT DETECTED NOT DETECTED Final   Entamoeba histolytica NOT DETECTED NOT DETECTED Final   Giardia lamblia NOT DETECTED NOT DETECTED Final   Adenovirus F40/41 NOT DETECTED NOT DETECTED Final   Astrovirus NOT DETECTED NOT  DETECTED Final   Norovirus GI/GII NOT DETECTED NOT DETECTED Final   Rotavirus A NOT DETECTED NOT DETECTED Final   Sapovirus (I, II, IV, and V) NOT DETECTED NOT DETECTED Final    Comment: Performed at [facility], [address] [place], [place] [address]      Radiology Studies: CT ABDOMEN PELVIS W CONTRAST  Result Date: 12/24/2022 CLINICAL DATA:  Abdominal pain, acute, nonlocalized.  Diarrhea. EXAM: CT ABDOMEN AND PELVIS WITH CONTRAST TECHNIQUE: Multidetector CT imaging of the abdomen and pelvis was performed using the standard protocol following bolus administration of intravenous contrast. RADIATION DOSE REDUCTION: This exam was performed according to the departmental dose-optimization program which includes automated exposure control, adjustment of the mA and/or kV according to patient size and/or use of iterative reconstruction technique. CONTRAST:  168m OMNIPAQUE IOHEXOL 300 MG/ML  SOLN COMPARISON:  CT abdomen and pelvis 12/08/2022. MRI abdomen 12/08/2022 FINDINGS: Lower chest: Dependent atelectasis in the lung bases. No pleural effusion. Normal heart size. Hepatobiliary: Unchanged 1.6 cm avidly enhancing lesion in the left hepatic lobe likely reflecting a hemangioma. Decreased intrahepatic and extrahepatic biliary dilatation with the common bile duct now measuring up to 9 mm in diameter. Decreased distension of the gallbladder with resolved pericholecystic inflammation. No calcified gallstones. Pancreas: Unremarkable. Spleen: Unremarkable. Adrenals/Urinary Tract: Unremarkable adrenal glands. Unchanged punctate calculus in the interpolar right kidney. No hydronephrosis or renal mass. Unremarkable bladder. Stomach/Bowel: A percutaneous gastrostomy tube remains in place. A small amount of chronic soft tissue extending inferiorly from the region of the pylorus is stable to slightly less prominent than on the prior study and could reflect the sequelae of prior ulcer disease or surgery. Fluid  is present in the colon and rectum. There is no evidence of bowel obstruction. No bowel wall thickening or surrounding inflammation is evident. A small linear metallic density in the rectum likely represents a T tack. Prior appendectomy. Vascular/Lymphatic: Abdominal aortic atherosclerosis without aneurysm. Left external iliac artery stent. No enlarged lymph nodes. Reproductive: Uterus and bilateral adnexa are unremarkable. Other: No ascites or pneumoperitoneum. Musculoskeletal: Diffuse osteopenia. No acute osseous abnormality or suspicious osseous lesion. IMPRESSION: 1. Fluid in the colon and rectum consistent with the history of diarrhea. No bowel obstruction or inflammation identified. 2. Decreased intrahepatic and extrahepatic biliary dilatation. Decreased gallbladder distension with resolved pericholecystic inflammation. 3. Nonobstructing right renal calculus. 4. Unchanged left hepatic lesion compatible with a hemangioma. 5.  Aortic Atherosclerosis (ICD10-I70.0). Electronically Signed   By: ALogan BoresM.D.   On: 12/24/2022 16:17    Scheduled Meds:  aspirin  81 mg Per Tube Daily   atorvastatin  80 mg Per Tube QPC supper   clopidogrel  75 mg  Per Tube Daily   diclofenac Sodium  4 g Topical QID   diphenoxylate-atropine  1 tablet Per Tube QID   ezetimibe  10 mg Per Tube Daily   famotidine  10.4 mg Per Tube QHS   feeding supplement (JEVITY 1.5 CAL/FIBER)  1,000 mL Per Tube Q24H   feeding supplement (PROSource TF20)  60 mL Per Tube TID BM   free water  100 mL Per Tube Q4H   melatonin  3 mg Per Tube QHS   multivitamin with minerals  1 tablet Per Tube Daily   saccharomyces boulardii  250 mg Per Tube BID   thiamine  100 mg Per Tube Daily   tiZANidine  2 mg Per Tube BID   Continuous Infusions:     LOS: 24 days   Shelly Coss, MD [facility] P2/13/2024, 11:39 AM

## 2022-12-25 NOTE — Progress Notes (Signed)
Physical Therapy Treatment Patient Details Name: Kara Bailey MRN: [id number] DOB: 01-Oct-1956 Today's Date: 12/25/2022   History of Present Illness Patient is 67 year old female admitted 11/29/22 with multifactorial dysphagia; s/p PEG tube 12/03/22.  Pt with hx of CVA with L hemiparesis, HTN, iliac stent, hep C, GERD    PT Comments    Pt ambulated 6' + 4' holding R handrail in hallway, with min A to block L knee 2* buckling. Ambulation distance. Gentle hamstring stretching and L knee extension AAROM performed. Pt puts forth good effort.    Recommendations for follow up therapy are one component of a multi-disciplinary discharge planning process, led by the attending physician.  Recommendations may be updated based on patient status, additional functional criteria and insurance authorization.  Follow Up Recommendations  Skilled nursing-short term rehab (<3 hours/day) Can patient physically be transported by private vehicle: No   Assistance Recommended at Discharge Frequent or constant Supervision/Assistance  Patient can return home with the following A little help with bathing/dressing/bathroom;Assistance with cooking/housework;Help with stairs or ramp for entrance;A lot of help with walking and/or transfers;Assist for transportation   Equipment Recommendations  None recommended by PT    Recommendations for Other Services       Precautions / Restrictions Precautions Precautions: Fall Precaution Comments: hx of CVA last year with L sided weakness; L knee buckles Restrictions Weight Bearing Restrictions: No     Mobility  Bed Mobility Overal bed mobility: Needs Assistance Bed Mobility: Supine to Sit     Supine to sit: Mod assist, HOB elevated     General bed mobility comments: assist to raise trunk    Transfers Overall transfer level: Needs assistance Equipment used: 1 person hand held assist Transfers: Bed to chair/wheelchair/BSC Sit to Stand: Mod assist Stand pivot  transfers: Mod assist, Min assist         General transfer comment: assisted bed to recliner    Ambulation/Gait Ambulation/Gait assistance: Mod assist Gait Distance (Feet): 10 Feet   Gait Pattern/deviations: Step-to pattern, Decreased stance time - left, Knees buckling Gait velocity: decreased     General Gait Details: pt ambulated 6' + 4' holding R handrail in hallway, assist to block L knee during stance 2* buckling, distance limited by fatigue   Stairs             Wheelchair Mobility    Modified Rankin (Stroke Patients Only)       Balance Overall balance assessment: Needs assistance Sitting-balance support: No upper extremity supported, Feet supported Sitting balance-Leahy Scale: Good Sitting balance - Comments: static sitting, doesn't reach outside BOS   Standing balance support: Single extremity supported Standing balance-Leahy Scale: Poor Standing balance comment: heavily reliant on R UE support static standing                            Cognition Arousal/Alertness: Awake/alert Behavior During Therapy: WFL for tasks assessed/performed Overall Cognitive Status: Within Functional Limits for tasks assessed                                 General Comments: Appears functional. Limited by expressive aphasia. Can point and make her needs known.        Exercises General Exercises - Lower Extremity Long Arc Quad: Seated, 10 reps, Left, AROM, AAROM Other Exercises Other Exercises: L hamstring stretch with assistance x 3 in seated position  General Comments        Pertinent Vitals/Pain Pain Assessment Pain Assessment: 0-10 Pain Score: 0-No pain Faces Pain Scale: No hurt Breathing: normal Facial Expression: smiling or inexpressive Body Language: relaxed Consolability: no need to console    Home Living                          Prior Function            PT Goals (current goals can now be found in the care  plan section) Acute Rehab PT Goals Patient Stated Goal: go to rehab PT Goal Formulation: With patient Time For Goal Achievement: 12/29/22 Potential to Achieve Goals: Good Progress towards PT goals: Progressing toward goals    Frequency    Min 2X/week      PT Plan Current plan remains appropriate    Co-evaluation              AM-PAC PT "6 Clicks" Mobility   Outcome Measure  Help needed turning from your back to your side while in a flat bed without using bedrails?: A Lot Help needed moving from lying on your back to sitting on the side of a flat bed without using bedrails?: A Lot Help needed moving to and from a bed to a chair (including a wheelchair)?: A Lot Help needed standing up from a chair using your arms (e.g., wheelchair or bedside chair)?: A Lot Help needed to walk in hospital room?: A Lot Help needed climbing 3-5 steps with a railing? : Total 6 Click Score: 11    End of Session Equipment Utilized During Treatment: Gait belt Activity Tolerance: Patient tolerated treatment well Patient left: in chair;with chair alarm set;with call bell/phone within reach Nurse Communication: Mobility status;Other (comment) (pt requests anxiety medication) PT Visit Diagnosis: Other abnormalities of gait and mobility (R26.89);Unsteadiness on feet (R26.81) Hemiplegia - Right/Left: Left Hemiplegia - dominant/non-dominant: Non-dominant Hemiplegia - caused by: Cerebral infarction     Time: [id number] PT Time Calculation (min) (ACUTE ONLY): 30 min  Charges:  $Gait Training: 8-22 mins $Therapeutic Exercise: 8-22 mins                     Blondell Reveal Kistler PT 12/25/2022  Acute Rehabilitation Services  Office [phone]

## 2022-12-26 DIAGNOSIS — I69391 Dysphagia following cerebral infarction: Secondary | ICD-10-CM | POA: Diagnosis not present

## 2022-12-26 NOTE — Progress Notes (Signed)
PROGRESS NOTE  RYLIEGH SEVCIK  [id number] DOB: 12-04-1955 DOA: 11/29/2022 PCP: Andree Moro, DO   Brief Narrative: Patient is a 67 year old female with history of CVA, hypertension presented the emergency department at the request of her PCP for the evaluation of persistent dysphagia.  Found to have multifactorial dysphagia likely from stroke.  Status post G-tube placement by IR.  PT/OT recommending SNF on discharge.  Developed nausea, vomiting, abdominal pain on 1/26,  CT abdomen/pelvis showed possible cholecystitis, MRCP confirmed acute cholecystitis.  General surgery was  following.  Significant improvement abdominal pain, nausea, vomiting, liver enzymes,abx course completed.  Diet started and she is tolerating.  Her insurance declined SNF so daughter has appealed, decision is pending.  Hospital course remarkable for persistent diarrhea. She is medically stable for discharge to SNF whenever possible.  Assessment & Plan:  Principal Problem:   Dysphagia as late effect of cerebrovascular accident (CVA) Active Problems:   Essential hypertension   Left hemiplegia (HCC)   History of stroke   Dysphagia   Protein-calorie malnutrition, severe  Dysphagia: Likely secondary to residual effect of the ischemic stroke.  Has history of esophageal stricture last dilated on 2021.  GI consulted and was following.  Barium swallow done on 11/30/2022 was nondiagnostic.  Underwent G-tube placement by IR on 1/22.  Underwent MBS again  speech therapy to check if she can swallow orally.Started on full liquid diet. Continue tube feeding diet, full liquid diet.   Abdomen pain, nausea, vomiting/acute cholecystitis: Resolved.  Likely acalculous cholecystitis.  CT abdomen/pelvis, MRCP confirmed acute cholecystitis without stones.  Patient was complaining of   periumbilical abdominal pain . Abdomen remains soft, nondistended, nontender with good bowel sounds.   Liver enzymes were elevated, now normalized.  General  surgery following signed off.  Abx changed to Augmentin,now completed the course .  Diarrhea: She was having diarrhea thought to be from antibiotics and also tube feeding.  We requested nutritionist to change the tube diet.  Also using Lomotil round-the-clock.    Continue probiotics. GI pathogen panel negative.  Continued to have intermittent diarrhea .   CT abdomen/pelvis with contrast did not show any bowel inflammation or any other etiology to explain abdominal pain.  Tried to check C. Difficile but not approved by Dr Johnnye Sima.  Using both Imodium and Lomotil, probiotics.  Diarrhea has slowed down today.  Severe protein calorie malnutrition: Nutrition was following.  BMI of 18.  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  On Plavix, aspirin, Lipitor, Zetia.  Also on baclofen and Zanaflex for left-sided hemiplegia and spasticity  Hypertension: Currently blood pressure soft/stable.  Not on any medications  Hypomagnesemia/hypokalemia: Continue magnesium supplementation.  Depression: On celexa  Normocytic anemia: No evidence of acute blood loss.  Currently hemoglobin stable in the range of 8-9  Debility/deconditioning: PT/OT recommending SNF on discharge.TOC following.  Insurance declined, family has appealed, pending placement      Nutrition Problem: Severe Malnutrition Etiology: chronic illness, dysphagia (h/o CVA)    DVT prophylaxis:SCDs Start: 11/30/22 0151     Code Status: Full Code  Family Communication: Called daughter [name] on phone on 2/10  Patient status:Inpatient  Patient is from :Home  Anticipated discharge to:SnF  Estimated DC date: as soon as bed is available,denied by insurance but appeal submitted by family   Consultants: GI,IR,surgery  Procedures:G tube placement   Antimicrobials:  Anti-infectives (From admission, onward)    Start     Dose/Rate Route Frequency Ordered Stop  12/11/22 2200   amoxicillin-clavulanate (AUGMENTIN) 400-57 MG/5ML suspension 875 mg        875 mg Per Tube Every 12 hours 12/11/22 1137 12/18/22 1039   12/11/22 1000  amoxicillin-clavulanate (AUGMENTIN) 875-125 MG per tablet 1 tablet  Status:  Discontinued        1 tablet Oral Every 12 hours 12/11/22 0821 12/11/22 1137   12/08/22 2000  piperacillin-tazobactam (ZOSYN) IVPB 3.375 g  Status:  Discontinued        3.375 g 12.5 mL/hr over 240 Minutes Intravenous Every 8 hours 12/08/22 1900 12/11/22 0821   12/07/22 1130  cefadroxil (DURICEF) capsule 500 mg  Status:  Discontinued        500 mg Oral 2 times daily 12/07/22 1033 12/07/22 1033   12/07/22 1130  cefadroxil (DURICEF) capsule 500 mg  Status:  Discontinued        500 mg Oral 2 times daily 12/07/22 1033 12/07/22 1034   12/07/22 1130  cefadroxil (DURICEF) capsule 500 mg  Status:  Discontinued        500 mg Per Tube 2 times daily 12/07/22 1034 12/08/22 1903   12/03/22 0700  ceFAZolin (ANCEF) IVPB 2g/100 mL premix        2 g 200 mL/hr over 30 Minutes Intravenous To Radiology 11/30/22 1602 12/04/22 0700       Subjective: Patient seen and examined the bedside today.  Hemodynamically stable.  She was sitting on the chair.  Looks more comfortable today.  She says the diarrhea has slowed down and just had 1 episode this morning.  Denies abdomen pain, nausea or vomiting  Objective: Vitals:   12/25/22 0518 12/25/22 1316 12/25/22 1951 12/26/22 0509  BP: 120/69 100/64 120/61 100/66  Pulse: (!) 102 97 (!) 104 96  Resp: 16 18 16 18  $ Temp: 98.2 F (36.8 C) 97.8 F (36.6 C) 98.3 F (36.8 C) 98.4 F (36.9 C)  TempSrc: Oral Oral Oral Oral  SpO2: 100% 96% 98% 95%  Weight: 52.7 kg     Height:       No intake or output data in the 24 hours ending 12/26/22 1053  Filed Weights   12/22/22 2113 12/24/22 0526 12/25/22 0518  Weight: 51.1 kg 52.4 kg 52.7 kg    Examination:  General exam: Overall comfortable, not in distress, deconditioned, weak HEENT:  PERRL Respiratory system:  no wheezes or crackles  Cardiovascular system: S1 & S2 heard, RRR.  Gastrointestinal system: Abdomen is nondistended, soft and nontender.  G-tube Central nervous system: Alert and oriented, left-sided hemiplegia Extremities: No edema, no clubbing ,no cyanosis Skin: No rashes, no ulcers,no icterus    Data Reviewed: I have personally reviewed following labs and imaging studies  CBC: Recent Labs  Lab 12/25/22 0607  WBC 10.2  HGB 7.3*  HCT 24.6*  MCV 96.1  PLT [id number]    Basic Metabolic Panel: Recent Labs  Lab 12/25/22 0607  NA 139  K 3.7  CL 105  CO2 26  GLUCOSE 119*  BUN 26*  CREATININE 0.58  CALCIUM 8.9     Recent Results (from the past 240 hour(s))  Gastrointestinal Panel by PCR , Stool     Status: None   Collection Time: 12/23/22 11:33 AM   Specimen: Stool  Result Value Ref Range Status   Campylobacter species NOT DETECTED NOT DETECTED Final   Plesimonas shigelloides NOT DETECTED NOT DETECTED Final   Salmonella species NOT DETECTED NOT DETECTED Final   Yersinia enterocolitica NOT DETECTED NOT DETECTED Final  Vibrio species NOT DETECTED NOT DETECTED Final   Vibrio cholerae NOT DETECTED NOT DETECTED Final   Enteroaggregative E coli (EAEC) NOT DETECTED NOT DETECTED Final   Enteropathogenic E coli (EPEC) NOT DETECTED NOT DETECTED Final   Enterotoxigenic E coli (ETEC) NOT DETECTED NOT DETECTED Final   Shiga like toxin producing E coli (STEC) NOT DETECTED NOT DETECTED Final   Shigella/Enteroinvasive E coli (EIEC) NOT DETECTED NOT DETECTED Final   Cryptosporidium NOT DETECTED NOT DETECTED Final   Cyclospora cayetanensis NOT DETECTED NOT DETECTED Final   Entamoeba histolytica NOT DETECTED NOT DETECTED Final   Giardia lamblia NOT DETECTED NOT DETECTED Final   Adenovirus F40/41 NOT DETECTED NOT DETECTED Final   Astrovirus NOT DETECTED NOT DETECTED Final   Norovirus GI/GII NOT DETECTED NOT DETECTED Final   Rotavirus A NOT DETECTED NOT DETECTED  Final   Sapovirus (I, II, IV, and V) NOT DETECTED NOT DETECTED Final    Comment: Performed at [facility], [address], [place], [place] [address]      Radiology Studies: CT ABDOMEN PELVIS W CONTRAST  Result Date: 12/24/2022 CLINICAL DATA:  Abdominal pain, acute, nonlocalized.  Diarrhea. EXAM: CT ABDOMEN AND PELVIS WITH CONTRAST TECHNIQUE: Multidetector CT imaging of the abdomen and pelvis was performed using the standard protocol following bolus administration of intravenous contrast. RADIATION DOSE REDUCTION: This exam was performed according to the departmental dose-optimization program which includes automated exposure control, adjustment of the mA and/or kV according to patient size and/or use of iterative reconstruction technique. CONTRAST:  176m OMNIPAQUE IOHEXOL 300 MG/ML  SOLN COMPARISON:  CT abdomen and pelvis 12/08/2022. MRI abdomen 12/08/2022 FINDINGS: Lower chest: Dependent atelectasis in the lung bases. No pleural effusion. Normal heart size. Hepatobiliary: Unchanged 1.6 cm avidly enhancing lesion in the left hepatic lobe likely reflecting a hemangioma. Decreased intrahepatic and extrahepatic biliary dilatation with the common bile duct now measuring up to 9 mm in diameter. Decreased distension of the gallbladder with resolved pericholecystic inflammation. No calcified gallstones. Pancreas: Unremarkable. Spleen: Unremarkable. Adrenals/Urinary Tract: Unremarkable adrenal glands. Unchanged punctate calculus in the interpolar right kidney. No hydronephrosis or renal mass. Unremarkable bladder. Stomach/Bowel: A percutaneous gastrostomy tube remains in place. A small amount of chronic soft tissue extending inferiorly from the region of the pylorus is stable to slightly less prominent than on the prior study and could reflect the sequelae of prior ulcer disease or surgery. Fluid is present in the colon and rectum. There is no evidence of bowel obstruction. No bowel wall thickening or  surrounding inflammation is evident. A small linear metallic density in the rectum likely represents a T tack. Prior appendectomy. Vascular/Lymphatic: Abdominal aortic atherosclerosis without aneurysm. Left external iliac artery stent. No enlarged lymph nodes. Reproductive: Uterus and bilateral adnexa are unremarkable. Other: No ascites or pneumoperitoneum. Musculoskeletal: Diffuse osteopenia. No acute osseous abnormality or suspicious osseous lesion. IMPRESSION: 1. Fluid in the colon and rectum consistent with the history of diarrhea. No bowel obstruction or inflammation identified. 2. Decreased intrahepatic and extrahepatic biliary dilatation. Decreased gallbladder distension with resolved pericholecystic inflammation. 3. Nonobstructing right renal calculus. 4. Unchanged left hepatic lesion compatible with a hemangioma. 5.  Aortic Atherosclerosis (ICD10-I70.0). Electronically Signed   By: ALogan BoresM.D.   On: 12/24/2022 16:17    Scheduled Meds:  aspirin  81 mg Per Tube Daily   atorvastatin  80 mg Per Tube QPC supper   citalopram  10 mg Oral q morning   clopidogrel  75 mg Per Tube Daily   diclofenac  Sodium  4 g Topical QID   diphenoxylate-atropine  1 tablet Per Tube QID   ezetimibe  10 mg Per Tube Daily   famotidine  10.4 mg Per Tube QHS   feeding supplement (JEVITY 1.5 CAL/FIBER)  1,000 mL Per Tube Q24H   feeding supplement (PROSource TF20)  60 mL Per Tube TID BM   free water  100 mL Per Tube Q4H   loperamide  2 mg Oral Q8H   melatonin  3 mg Per Tube QHS   multivitamin with minerals  1 tablet Per Tube Daily   saccharomyces boulardii  250 mg Per Tube BID   thiamine  100 mg Per Tube Daily   tiZANidine  2 mg Per Tube BID   Continuous Infusions:     LOS: 25 days   Shelly Coss, MD [facility] P2/14/2024, 10:53 AM

## 2022-12-26 NOTE — TOC Progression Note (Signed)
Transition of Care [facility]) - Progression Note    Patient Details  Name: Kara Bailey MRN: [id number] Date of Birth: 1956/04/12  Transition [facility]) CM/SW [place], RN Phone [phone]  12/26/2022, 10:40 AM  Clinical Narrative:    CM called [employer] to check status of insurance auth appeal. Per Mcarthur Rossetti appeal is still in progress with a due date of 01/06/23 for the determination. CM has spoken with [facility] supervisor Nancy Marus for options. Daughter has been updated that she will need to apply for long term medicaid is she wants patient to go to SNF for long term, since the daughter has states that she is not able to provide care for patient at home. After long term medicaid application has been submitted TOC can attempt to get a bed offer under medicaid. Second option would be that daughter will need to take patient home to care for her with home health therapy. Daughter inquires about where she can apply for long term medicaid. Daughter has been advised that she can apply at [place] or online. CM to follow up with daughter. TOC will continue to follow.   18 Daughter [name] has confirmed that she will apply for long term medicaid this week.   Expected Discharge Plan: Skilled Nursing Facility Barriers to Discharge: SNF Authorization Denied  Expected Discharge Plan and Services In-house Referral: NA Discharge Planning Services: CM Consult Post Acute Care Choice: [place] Living arrangements for the past 2 months: Apartment Expected Discharge Date: 12/07/22               DME Arranged: N/A DME Agency: NA       HH Arranged: NA HH Agency: NA         Social Determinants of Health (SDOH) Interventions SDOH Screenings   Food Insecurity: Unknown (12/04/2022)  Housing: Low Risk  (12/04/2022)  Transportation Needs: Unknown (12/04/2022)  Utilities: Unknown (12/04/2022)  Depression (PHQ2-9): High Risk (09/19/2022)  Tobacco Use: High Risk (12/03/2022)     Readmission Risk Interventions    12/17/2022    1:42 PM  Readmission Risk Prevention Plan  Transportation Screening Complete  PCP or Specialist Appt within 3-5 Days Complete  HRI or [place] Complete  Social Work Consult for [place] Planning/Counseling [place] Not Applicable  Medication Review Press photographer) Referral to Pharmacy

## 2022-12-26 NOTE — Progress Notes (Signed)
Occupational Therapy Treatment Patient Details Name: Kara Bailey MRN: [id number] DOB: 1956-07-07 Today's Date: 12/26/2022   History of present illness Patient is 67 year old female admitted 11/29/22 with multifactorial dysphagia; s/p PEG tube 12/03/22.  Pt with hx of CVA with L hemiparesis, HTN, iliac stent, hep C, GERD   OT comments  Patient was educated on importance of increasing resting hand splint tolerance and wearing each day. Patient verbalized understanding. Plan for OT to come check back on patient after two hours of wear time with goal for today to be able to tolerate 4 hours of wear time. Patient agreed with plan. Nursing made aware. Patient's discharge plan remains appropriate at this time. OT will continue to follow acutely.     Recommendations for follow up therapy are one component of a multi-disciplinary discharge planning process, led by the attending physician.  Recommendations may be updated based on patient status, additional functional criteria and insurance authorization.    Follow Up Recommendations  Skilled nursing-short term rehab (<3 hours/day)     Assistance Recommended at Discharge Frequent or constant Supervision/Assistance  Patient can return home with the following  A lot of help with bathing/dressing/bathroom;Assistance with cooking/housework;Direct supervision/assist for medications management;Assist for transportation;Help with stairs or ramp for entrance;A lot of help with walking and/or transfers   Equipment Recommendations  None recommended by OT       Precautions / Restrictions Precautions Precautions: Fall Precaution Comments: hx of CVA last year with L sided weakness; L knee buckles Restrictions Weight Bearing Restrictions: No       Mobility Bed Mobility Overal bed mobility: Needs Assistance Bed Mobility: Supine to Sit     Supine to sit: Mod assist, HOB elevated     General bed mobility comments: assist to raise trunk        Balance Overall balance assessment: Needs assistance Sitting-balance support: No upper extremity supported, Feet supported Sitting balance-Leahy Scale: Good     Standing balance support: Single extremity supported Standing balance-Leahy Scale: Poor                             ADL either performed or assessed with clinical judgement   ADL Overall ADL's : Needs assistance/impaired       Toilet Transfer Details (indicate cue type and reason): patient was min A to transfer from edge of bed to recliner in room to the L side with A to assist with weight shifting to pivot into chair. Toileting- Clothing Manipulation and Hygiene: Bed level;Total assistance Toileting - Clothing Manipulation Details (indicate cue type and reason): total A for hygiene in bed with patient min A to roll with education on rolling to prevent pressure on glenohumeral joint. patient unable to complete positioning without physical A.       General ADL Comments: patient was educated on splinting tolerance and importance of building up to wearing resting hand longer periods. patient was educated that she needed to let nursing know if it hurts or when she needs to take it off. patient verbalized understanding. patient was total A to don resting hand splint with gentle ROM provided to UE prior to donning.      Cognition Arousal/Alertness: Awake/alert Behavior During Therapy: WFL for tasks assessed/performed Overall Cognitive Status: Within Functional Limits for tasks assessed                     Pertinent Vitals/ Pain  Pain Assessment Pain Assessment: Faces Faces Pain Scale: Hurts little more Pain Location: L shoulder with all movement Pain Descriptors / Indicators: Grimacing, Guarding Pain Intervention(s): Limited activity within patient's tolerance, Monitored during session         Frequency  Min 2X/week        Progress Toward Goals  OT Goals(current goals can now be found in  the care plan section)  Progress towards OT goals: Progressing toward goals     Plan Discharge plan remains appropriate       AM-PAC OT "6 Clicks" Daily Activity     Outcome Measure   Help from another person eating meals?: A Little Help from another person taking care of personal grooming?: A Little Help from another person toileting, which includes using toliet, bedpan, or urinal?: A Lot Help from another person bathing (including washing, rinsing, drying)?: A Lot Help from another person to put on and taking off regular upper body clothing?: A Lot Help from another person to put on and taking off regular lower body clothing?: A Lot 6 Click Score: 14    End of Session Equipment Utilized During Treatment: Gait belt;Other (comment) (resting hand splint)  OT Visit Diagnosis: Unsteadiness on feet (R26.81);Muscle weakness (generalized) (M62.81);Pain;Hemiplegia and hemiparesis Hemiplegia - Right/Left: Left Hemiplegia - dominant/non-dominant: Dominant Hemiplegia - caused by: Cerebral infarction Pain - Right/Left: Left Pain - part of body: Arm   Activity Tolerance Patient tolerated treatment well   Patient Left in chair;with call bell/phone within reach;with chair alarm set   Nurse Communication Other (comment) (splinting guidelines)        Time: [id number] OT Time Calculation (min): 21 min  Charges: OT General Charges $OT Visit: 1 Visit OT Treatments $Therapeutic Activity: 8-22 mins  Rennie Plowman, MS Acute Rehabilitation Department Office# [phone]   Willa Rough 12/26/2022, 10:41 AM

## 2022-12-26 NOTE — Progress Notes (Addendum)
Occupational Therapy Treatment Patient Details Name: RASHEEDAH LIMBACK MRN: [id number] DOB: 17-Mar-1956 Today's Date: 12/26/2022   History of present illness Patient is 67 year old female admitted 11/29/22 with multifactorial dysphagia; s/p PEG tube 12/03/22.  Pt with hx of CVA with L hemiparesis, HTN, iliac stent, hep C, GERD   OT comments  Patient had splint removed upon return to room at agreed upon time. Patient reported she removed splint at 9:30 with reports of pain on dorsal side of forearm. Patient was educated on importance of trying to increase tolerance and wearing multiple times a day. Patient declined to wear either splint at this time. Patient continues to be a high risk for contractures with splinting program in development. Patient's discharge plan remains appropriate at this time. OT will continue to follow acutely.     Recommendations for follow up therapy are one component of a multi-disciplinary discharge planning process, led by the attending physician.  Recommendations may be updated based on patient status, additional functional criteria and insurance authorization.    Follow Up Recommendations  Skilled nursing-short term rehab (<3 hours/day)     Assistance Recommended at Discharge Frequent or constant Supervision/Assistance  Patient can return home with the following  A lot of help with bathing/dressing/bathroom;Assistance with cooking/housework;Direct supervision/assist for medications management;Assist for transportation;Help with stairs or ramp for entrance;A lot of help with walking and/or transfers   Equipment Recommendations  None recommended by OT       Precautions / Restrictions Precautions Precautions: Fall Precaution Comments: hx of CVA last year with L sided weakness; L knee buckles Restrictions Weight Bearing Restrictions: No       Mobility Bed Mobility Overal bed mobility: Needs Assistance Bed Mobility: Supine to Sit     Supine to sit: Mod  assist, HOB elevated     General bed mobility comments: assist to raise trunk      Balance Overall balance assessment: Needs assistance Sitting-balance support: No upper extremity supported, Feet supported Sitting balance-Leahy Scale: Good     Standing balance support: Single extremity supported Standing balance-Leahy Scale: Poor                             ADL either performed or assessed with clinical judgement   ADL Overall ADL's : Needs assistance/impaired     Toilet Transfer Details (indicate cue type and reason): patient was mod A to transfer to L side with stand pivot with increased time and A for movemnet of L side.         General ADL Comments: upon entrance to room, patient was noted to have removed splint from hand already with it on bed side table. patient reported that she took it off at 9:30 because it hurt. no message from nurse was received. patient was educated on importance of increasing wear times. patient verbalized understanding. patient reported she would try it more later.      Cognition Arousal/Alertness: Awake/alert Behavior During Therapy: WFL for tasks assessed/performed Overall Cognitive Status: Within Functional Limits for tasks assessed                           Pertinent Vitals/ Pain       Pain Assessment Pain Assessment: Faces Faces Pain Scale: Hurts little more Pain Location: L shoulder with all movement Pain Descriptors / Indicators: Grimacing, Guarding Pain Intervention(s): Limited activity within patient's tolerance, Monitored during session, Repositioned  Frequency  Min 2X/week        Progress Toward Goals  OT Goals(current goals can now be found in the care plan section)  Progress towards OT goals: Not progressing toward goals - comment (patient was only able to tolerate splint for 1 hour today. unable to observe removal to make adjustments as needed.)     Plan Discharge plan remains  appropriate       AM-PAC OT "6 Clicks" Daily Activity     Outcome Measure   Help from another person eating meals?: A Little Help from another person taking care of personal grooming?: A Little Help from another person toileting, which includes using toliet, bedpan, or urinal?: A Lot Help from another person bathing (including washing, rinsing, drying)?: A Lot Help from another person to put on and taking off regular upper body clothing?: A Lot Help from another person to put on and taking off regular lower body clothing?: A Lot 6 Click Score: 14    End of Session Equipment Utilized During Treatment: Gait belt  OT Visit Diagnosis: Unsteadiness on feet (R26.81);Muscle weakness (generalized) (M62.81);Pain;Hemiplegia and hemiparesis Hemiplegia - Right/Left: Left Hemiplegia - dominant/non-dominant: Dominant Hemiplegia - caused by: Cerebral infarction Pain - Right/Left: Left Pain - part of body: Arm   Activity Tolerance Patient tolerated treatment well   Patient Left in bed;with call bell/phone within reach;with bed alarm set   Nurse Communication Other (comment) (splinting guidelines)        Time: [id number] OT Time Calculation (min): 11 min  Charges: OT General Charges $OT Visit: 1 Visit OT Treatments $Therapeutic Activity: 8-22 mins  Rennie Plowman, MS Acute Rehabilitation Department Office# [phone]   Willa Rough 12/26/2022, 10:46 AM

## 2022-12-27 DIAGNOSIS — R710 Precipitous drop in hematocrit: Secondary | ICD-10-CM

## 2022-12-27 DIAGNOSIS — R195 Other fecal abnormalities: Secondary | ICD-10-CM | POA: Diagnosis not present

## 2022-12-27 DIAGNOSIS — I69391 Dysphagia following cerebral infarction: Secondary | ICD-10-CM | POA: Diagnosis not present

## 2022-12-27 LAB — CBC
HCT: 20.2 % — ABNORMAL LOW (ref 36.0–46.0)
Hemoglobin: 6.3 g/dL — CL (ref 12.0–15.0)
MCH: 29.4 pg (ref 26.0–34.0)
MCHC: 31.2 g/dL (ref 30.0–36.0)
MCV: 94.4 fL (ref 80.0–100.0)
Platelets: 226 10*3/uL (ref 150–400)
RBC: 2.14 MIL/uL — ABNORMAL LOW (ref 3.87–5.11)
RDW: 19.6 % — ABNORMAL HIGH (ref 11.5–15.5)
WBC: 9.4 10*3/uL (ref 4.0–10.5)
nRBC: 0 % (ref 0.0–0.2)

## 2022-12-27 LAB — ABO/RH: ABO/RH(D): A POS

## 2022-12-27 LAB — PREPARE RBC (CROSSMATCH)

## 2022-12-27 LAB — HEMOGLOBIN AND HEMATOCRIT, BLOOD
HCT: 23.9 % — ABNORMAL LOW (ref 36.0–46.0)
Hemoglobin: 7.7 g/dL — ABNORMAL LOW (ref 12.0–15.0)

## 2022-12-27 LAB — OCCULT BLOOD X 1 CARD TO LAB, STOOL: Fecal Occult Bld: POSITIVE — AB

## 2022-12-27 MED ORDER — PANTOPRAZOLE SODIUM 40 MG IV SOLR
40.0000 mg | Freq: Two times a day (BID) | INTRAVENOUS | Status: DC
  Administered 2022-12-27 (×2): 40 mg via INTRAVENOUS
  Filled 2022-12-27 (×2): qty 10

## 2022-12-27 MED ORDER — SODIUM CHLORIDE 0.9% IV SOLUTION: Freq: Once | INTRAVENOUS | Status: AC

## 2022-12-27 MED ORDER — DIPHENOXYLATE-ATROPINE 2.5-0.025 MG/5ML PO LIQD: 10.0000 mL | Freq: Four times a day (QID) | ORAL | Status: DC

## 2022-12-27 MED ORDER — DIPHENOXYLATE-ATROPINE 2.5-0.025 MG PO TABS
2.0000 | ORAL_TABLET | Freq: Four times a day (QID) | ORAL | Status: DC
  Administered 2022-12-27 – 2023-01-14 (×66): 2
  Filled 2022-12-27 (×65): qty 2

## 2022-12-27 NOTE — H&P (View-Only) (Signed)
Consultation  Referring Provider:     [facility] Primary Care Physician:  Andree Moro, DO Primary Gastroenterologist:    Loletha Carrow     Reason for Consultation:     Heme positive, decreased hemoglobin   Reconsultation initially seen for dysphagia 11/30/2022 see that note also  Impression / Plan:   Heme positive stool and decreased hemoglobin question upper GI bleed.  Stools were dark yesterday but probably not melanic per nursing history they are brown today.  BUN to creatinine ratio is newly elevated.  Diarrhea likely from tube nutrition supplements.  Prior stroke with neurogenic dysphagia and percutaneous gastrostomy tube dependence though is on full liquids.  Plavix and aspirin therapy on hold  ----------------------------------------------------------------------------------------------------------------------  Agree with current management.  Plan for EGD tomorrow to look for source of possible upper GI bleed.  Unlikely to dilate esophagus showed persistent stricture be present given recent Plavix.     Change diarrhea management to 2 Lomotil 4 times daily (I prescribed liquid through 2 but there is a shortage) and discontinue 3 times daily loperamide.  Further plans pending the above.  The risks and benefits as well as alternatives of endoscopic procedure(s) have been discussed and reviewed. All questions answered. The patient agrees to proceed. I believe the patient understands what I explained as far as informed consent is concerned.  I did attempt to call her daughter Lenna Sciara but could not reach her.   Gatha Mayer, MD, [facility] See AMION on call - gastroenterology for best contact person 12/27/2022 3:33 PM         HPI:   LAHNI NEIFERT is a 67 y.o. female with a history of ischemic stroke and severe dysarthria and dysphagia who was admitted in late January because of failure to thrive, she had a percutaneous gastrostomy tube placed.  Recently she  has had a decline in her hemoglobin and reported dark stools yesterday though not clearly melanic, with brown stools today.  Stool is heme positive.  Hemoglobin declined from 9 on February 5 2-7.3 on February 13 and 6.3 this morning.  She has been transfused a unit of packed red cells and hemoglobin is 7.7.  Her Plavix and aspirin were held.  She is complaining of some mild abdominal pain as well.  She is able to provide history though speech is not normal it is understandable that she is able to communicate with me.  In 2021 she had gastritis and an esophageal stricture dilated to 15.5 mm by Dr. Loletha Carrow.  Dr. Gala Romney removed a 9 mm sigmoid adenoma in 2017.  Colonoscopy otherwise without significant pathology.  As far as diarrhea here she has been treated with loperamide and Lomotil.  Loperamide 2 mg every 8 hours and Lomotil 2.5 mg every 6 hours or 4 times daily.  This has helped somewhat.  C. difficile testing was requested but denied by ID diarrhea usage does seem to correlate with liquid tube nutrition use.  Past Medical History:  Diagnosis Date   Acute CVA (cerebrovascular accident) ([place]) 12/29/2020   Anxiety    Chest pain 08/2014   unspecified   Cirrhosis ([place])    CVA (cerebral vascular accident) ([place]) 07/27/2022   Depression    Dizziness 08/2014   Dyspnea 08/2014   Dysrhythmia    Emphysema lung ([facility])    GERD (gastroesophageal reflux disease)    Heart murmur    Hepatitis C    Hiatal hernia    Hypertension    Ischemic stroke (  [place]) 08/01/2022   PAC (premature atrial contraction) 10/11/2014   PAD (peripheral artery disease) ([facility])    Palpitations    PVC (premature ventricular contraction) 10/11/2014   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   Stroke ([place])    just slurred speech   Tachycardia 08/2014   Ventral hernia    Weakness 08/2014    Past Surgical History:  Procedure Laterality Date   ABDOMINAL AORTOGRAM W/LOWER EXTREMITY N/A 07/13/2021   Procedure: ABDOMINAL  AORTOGRAM W/LOWER EXTREMITY;  Surgeon: Lorretta Harp, MD;  Location: [facility];  Service: Cardiovascular;  Laterality: N/A;   ABDOMINAL AORTOGRAM W/LOWER EXTREMITY N/A 03/19/2022   Procedure: ABDOMINAL AORTOGRAM W/LOWER EXTREMITY;  Surgeon: Lorretta Harp, MD;  Location: [facility];  Service: Cardiovascular;  Laterality: N/A;   APPENDECTOMY     CATARACT EXTRACTION, BILATERAL     COLONOSCOPY     COLONOSCOPY WITH PROPOFOL N/A 04/30/2016   Procedure: COLONOSCOPY WITH PROPOFOL;  Surgeon: Daneil Dolin, MD;  [facility];  Service: Endoscopy;  Laterality: N/A;  [place] N/A 10/13/2021   Procedure: OPEN REPAIR EPIGASTRIC HERNIA WITH MESH PATCH;  Surgeon: Armandina Gemma, MD;  Location: [facility];  Service: General;  Laterality: N/A;   ESOPHAGOGASTRODUODENOSCOPY     approximately 2010   ESOPHAGOGASTRODUODENOSCOPY (EGD) WITH PROPOFOL N/A 04/30/2016   Procedure: ESOPHAGOGASTRODUODENOSCOPY (EGD) WITH PROPOFOL;  Surgeon: Daneil Dolin, MD;  [facility];  Service: Endoscopy;  Laterality: N/A;   IR GASTROSTOMY TUBE MOD SED  12/03/2022   LEFT HEART CATHETERIZATION WITH CORONARY ANGIOGRAM N/A 10/29/2014   07-14-20- pt denies this Procedure: LEFT HEART CATHETERIZATION WITH CORONARY ANGIOGRAM;  Surgeon: Burnell Blanks, MD;  Location: [facility];  Service: Cardiovascular;  Laterality: N/A;   MALONEY DILATION N/A 04/30/2016   Procedure: Venia Minks DILATION;  Surgeon: Daneil Dolin, MD;  [facility];  Service: Endoscopy;  Laterality: N/A;   PERIPHERAL VASCULAR INTERVENTION  07/13/2021   Procedure: PERIPHERAL VASCULAR INTERVENTION;  Surgeon: Lorretta Harp, MD;  Location: [facility];  Service: Cardiovascular;;  left SFA left external iliac   PERIPHERAL VASCULAR INTERVENTION Right 03/19/2022   Procedure: PERIPHERAL VASCULAR INTERVENTION;  Surgeon: Lorretta Harp, MD;  Location: [facility];  Service: Cardiovascular;   Laterality: Right;  SFA   POLYPECTOMY  04/30/2016   Procedure: POLYPECTOMY;  Surgeon: Daneil Dolin, MD;  [facility];  Service: Endoscopy;;  Sigmoid colon polyp removed via hot snare   UPPER GASTROINTESTINAL ENDOSCOPY      Family History  Problem Relation Age of Onset   Heart failure Mother    Heart attack Mother    Heart disease Mother    Hypertension Mother    Hyperlipidemia Mother    CVA Father    Alzheimer's disease Father    Cancer Neg Hx    Colon cancer Neg Hx    Esophageal cancer Neg Hx    Rectal cancer Neg Hx    Stomach cancer Neg Hx     Social History   Tobacco Use   Smoking status: Every Day    Packs/day: 0.50    Years: 40.00    Total pack years: 20.00    Types: Cigarettes    Start date: 11/12/1968   Smokeless tobacco: Never   Tobacco comments:    cutting back, wearing patches  Vaping Use   Vaping Use: Never used  Substance Use Topics   Alcohol use:  Not Currently    Comment: beer on weekends (6-pack)   Drug use: No    Prior to Admission medications   Medication Sig Start Date End Date Taking? Authorizing Provider  albuterol (VENTOLIN HFA) 108 (90 Base) MCG/ACT inhaler Inhale 2 puffs into the lungs every 6 (six) hours as needed for wheezing or shortness of breath.   Yes [provider]  aspirin EC 81 MG EC tablet Take 1 tablet (81 mg total) by mouth daily. Swallow whole. 12/31/20  Yes Debbe Odea, MD  atorvastatin (LIPITOR) 80 MG tablet Take 1 tablet (80 mg total) by mouth daily. 09/06/22  Yes Setzer, [address], PA-C  Baclofen 5 MG TABS Take 1 tablet by mouth every 8 (eight) hours as needed (pain). 11/24/22  Yes [provider]  citalopram (CELEXA) 10 MG tablet Take 10 mg by mouth every morning. 10/31/22  Yes [provider]  diltiazem (CARDIZEM CD) 240 MG 24 hr capsule Take 1 capsule (240 mg total) by mouth daily. 09/07/22  Yes Setzer, [address], PA-C  ezetimibe (ZETIA) 10 MG tablet Take 10 mg by mouth daily.   Yes  [provider]  famotidine (PEPCID) 40 MG tablet Take 40 mg by mouth at bedtime. 10/25/20  Yes [provider]  hydroxypropyl methylcellulose / hypromellose (ISOPTO TEARS / GONIOVISC) 2.5 % ophthalmic solution Place 1 drop into both eyes in the morning and at bedtime.   Yes [provider]  hydrOXYzine (ATARAX) 50 MG tablet Take 50 mg by mouth every 8 (eight) hours as needed for anxiety.   Yes [provider]  mirtazapine (REMERON) 15 MG tablet Take 15 mg by mouth at bedtime. 09/27/22  Yes [provider]  Multiple Vitamin (MULTIVITAMIN WITH MINERALS) TABS tablet Take 1 tablet by mouth daily. 12/31/20  Yes Debbe Odea, MD  thiamine (VITAMIN B-1) 100 MG tablet Take 100 mg by mouth daily.   Yes [provider]  acetaminophen (TYLENOL) 325 MG tablet Take 1-2 tablets (325-650 mg total) by mouth every 4 (four) hours as needed for mild pain. Patient not taking: Reported on 11/29/2022 09/06/22   Vaughan Basta, [address], PA-C  ALPRAZolam Duanne Moron) 0.25 MG tablet Take 2 tablets (0.5 mg total) by mouth daily as needed for anxiety. 12/07/22   Shelly Coss, MD  aspirin 81 MG chewable tablet Place 1 tablet (81 mg total) into feeding tube daily. 12/17/22   Florencia Reasons, MD  clopidogrel (PLAVIX) 75 MG tablet Take 1 tablet (75 mg total) by mouth daily. 12/20/22   Lorretta Harp, MD  feeding supplement (ENSURE ENLIVE / ENSURE PLUS) LIQD Take 237 mLs by mouth 2 (two) times daily between meals. 12/07/22   Shelly Coss, MD  magnesium oxide (MAG-OX) 400 (240 Mg) MG tablet Place 2 tablets (800 mg total) into feeding tube 2 (two) times daily. 12/07/22   Shelly Coss, MD  melatonin 3 MG TABS tablet Take 1 tablet (3 mg total) by mouth at bedtime. Patient not taking: Reported on 11/29/2022 09/06/22   Barbie Banner, PA-C  mirtazapine (REMERON) 30 MG tablet Take 0.5 tablets (15 mg total) by mouth at bedtime. Patient not taking: Reported on 11/29/2022 08/13/18   Argentina Donovan,  PA-C  Nutritional Supplements (FEEDING SUPPLEMENT, OSMOLITE 1.5 CAL,) LIQD Place 1,000 mLs into feeding tube continuous. 12/07/22   Shelly Coss, MD  polyethylene glycol (MIRALAX / GLYCOLAX) 17 g packet Take 17 g by mouth daily as needed. Patient not taking: Reported on 11/29/2022 09/06/22   Barbie Banner,  PA-C  senna-docusate (SENOKOT-S) 8.6-50 MG tablet Take 1 tablet by mouth 2 (two) times daily. Patient not taking: Reported on 11/29/2022 09/06/22   Barbie Banner, PA-C  tiZANidine (ZANAFLEX) 2 MG tablet TAKE 1 TABLET(2 MG) BY MOUTH AT BEDTIME Patient not taking: Reported on 11/29/2022 10/15/22   Charlett Blake, MD  traMADol (ULTRAM) 50 MG tablet Take 1 tablet (50 mg total) by mouth every 6 (six) hours as needed for moderate pain. 12/07/22   Shelly Coss, MD  Water For Irrigation, Sterile (FREE WATER) SOLN Place 100 mLs into feeding tube every 4 (four) hours. 12/07/22   Shelly Coss, MD    Current Facility-Administered Medications  Medication Dose Route Frequency Provider Last Rate Last Admin   acetaminophen (TYLENOL) suppository 650 mg  650 mg Rectal Q4H PRN Kristopher Oppenheim, DO   650 mg at 12/07/22 0510   ALPRAZolam (XANAX) tablet 0.25 mg  0.25 mg Per Tube TID PRN Shelly Coss, MD   0.25 mg at 12/27/22 0518   atorvastatin (LIPITOR) tablet 80 mg  80 mg Per Tube QPC supper Shelly Coss, MD   80 mg at 12/26/22 1728   baclofen (LIORESAL) tablet 5 mg  5 mg Per NG tube Q8H PRN Florencia Reasons, MD   5 mg at 12/26/22 1727   citalopram (CELEXA) tablet 10 mg  10 mg Oral q morning Shelly Coss, MD   10 mg at 12/27/22 0941   diclofenac Sodium (VOLTAREN) 1 % topical gel 4 g  4 g Topical QID Kristopher Oppenheim, DO   4 g at 12/26/22 1256   diphenoxylate-atropine (LOMOTIL) 2.5-0.025 MG/5ML liquid 10 mL  10 mL Per Tube QID Gatha Mayer, MD       ezetimibe (ZETIA) tablet 10 mg  10 mg Per Tube Daily Florencia Reasons, MD   10 mg at 12/27/22 0939   feeding supplement (JEVITY 1.5 CAL/FIBER) liquid 1,000 mL  1,000 mL  Per Tube Q24H Shelly Coss, MD   1,000 mL at 12/27/22 0946   feeding supplement (PROSource TF20) liquid 60 mL  60 mL Per Tube TID BM Shelly Coss, MD   60 mL at 12/27/22 1338   free water 100 mL  100 mL Per Tube Q4H Florencia Reasons, MD   100 mL at 12/27/22 1143   guaiFENesin-dextromethorphan (ROBITUSSIN DM) 100-10 MG/5ML syrup 5 mL  5 mL Per Tube Q4H PRN Adhikari, Amrit, MD       iohexol (OMNIPAQUE) 300 MG/ML solution 100 mL  100 mL Intravenous Once PRN Adhikari, Amrit, MD       iohexol (OMNIPAQUE) 300 MG/ML solution 100 mL  100 mL Intravenous Once PRN Shelly Coss, MD       lip balm (CARMEX) ointment   Topical PRN Shelly Coss, MD   75 Application at [id number]   melatonin tablet 3 mg  3 mg Per Tube QHS Florencia Reasons, MD   3 mg at 12/26/22 2034   morphine (PF) 2 MG/ML injection 2 mg  2 mg Intravenous Q4H PRN Shelly Coss, MD   2 mg at 12/26/22 2034   multivitamin with minerals tablet 1 tablet  1 tablet Per Tube Daily Shelly Coss, MD   1 tablet at 12/27/22 0937   ondansetron (ZOFRAN) tablet 4 mg  4 mg Oral Q6H PRN Kristopher Oppenheim, DO   4 mg at 12/12/22 1606   Or   ondansetron (ZOFRAN) injection 4 mg  4 mg Intravenous Q6H PRN Kristopher Oppenheim, DO   4 mg at 12/09/22 2127  oxyCODONE (Oxy IR/ROXICODONE) immediate release tablet 5 mg  5 mg Oral Q6H PRN Shelly Coss, MD   5 mg at 12/27/22 0944   pantoprazole (PROTONIX) injection 40 mg  40 mg Intravenous Q12H Adhikari, Tamsen Meek, MD   40 mg at 12/27/22 1338   phenol (CHLORASEPTIC) mouth spray 1 spray  1 spray Mouth/Throat PRN Florencia Reasons, MD   1 spray at 12/02/22 1738   saccharomyces boulardii (FLORASTOR) capsule 250 mg  250 mg Per Tube BID Florencia Reasons, MD   250 mg at 12/27/22 [id number]   thiamine (VITAMIN B1) tablet 100 mg  100 mg Per Tube Daily Florencia Reasons, MD   100 mg at 12/27/22 [id number]   tiZANidine (ZANAFLEX) tablet 2 mg  2 mg Per Tube BID Florencia Reasons, MD   2 mg at 12/27/22 [id number]    Allergies as of 11/29/2022   (No Known Allergies)     Review of Systems:    This  is positive for those things mentioned in the HPI.   Physical Exam:  Vital signs in last 24 hours: Temp:  [97.7 F (36.5 C)-98.4 F (36.9 C)] 98.4 F (36.9 C) (02/15 1333) Pulse Rate:  [89-109] 95 (02/15 1333) Resp:  [14-20] 20 (02/15 1141) BP: (99-110)/(54-87) 99/64 (02/15 1333) SpO2:  [96 %-100 %] 96 % (02/15 1141) Last BM Date : 12/27/22  General:  Thin chronically ill black woman in no acute distress.  Speech is dysarthric and limited but the words she uses are intelligible.  She communicates by pointing as well. Eyes:  anicteric..  Lungs: Clear to auscultation bilaterally. Heart:   S1S2, no rubs, murmurs, gallops. Abdomen:  soft, non-tender, no hepatosplenomegaly, hernia, or mass and BS+.  Gastrostomy tube in the middle left upper quadrant area-bandaged  Neuro:  Left hemiplegia.  Follows commands appropriately. Psych:  appropriate mood and  Affect.   Data Reviewed:   LAB RESULTS: Recent Labs    12/25/22 0607 12/27/22 0706 12/27/22 1445  WBC 10.2 9.4  --   HGB 7.3* 6.3* 7.7*  HCT 24.6* 20.2* 23.9*  PLT 247 226  --    BMET Recent Labs    12/25/22 0607  NA 139  K 3.7  CL 105  CO2 26  GLUCOSE 119*  BUN 26*  CREATININE 0.58  CALCIUM 8.9    Thanks   LOS: 26 days   @Rhonin Trott$  Simonne Maffucci, MD, [facility] @  12/27/2022, 3:29 PM

## 2022-12-27 NOTE — Progress Notes (Signed)
SLP Cancellation Note  Patient Details Name: Kara Bailey MRN: [id number] DOB: 1956-02-11   Cancelled treatment:       Reason Eval/Treat Not Completed: Other (comment) (pt now with GI bleed - and for Endoscopy tomorrow 12/28/2022; will continue efforts for swallow and AAC treatment)  Kathleen Lime, MS [place] Office [phone]  Macario Golds 12/27/2022, 6:31 PM

## 2022-12-27 NOTE — Consult Note (Signed)
Consultation  Referring Provider:     [facility] Primary Care Physician:  Andree Moro, DO Primary Gastroenterologist:    Loletha Carrow     Reason for Consultation:     Heme positive, decreased hemoglobin   Reconsultation initially seen for dysphagia 11/30/2022 see that note also  Impression / Plan:   Heme positive stool and decreased hemoglobin question upper GI bleed.  Stools were dark yesterday but probably not melanic per nursing history they are brown today.  BUN to creatinine ratio is newly elevated.  Diarrhea likely from tube nutrition supplements.  Prior stroke with neurogenic dysphagia and percutaneous gastrostomy tube dependence though is on full liquids.  Plavix and aspirin therapy on hold  ----------------------------------------------------------------------------------------------------------------------  Agree with current management.  Plan for EGD tomorrow to look for source of possible upper GI bleed.  Unlikely to dilate esophagus showed persistent stricture be present given recent Plavix.     Change diarrhea management to 2 Lomotil 4 times daily (I prescribed liquid through 2 but there is a shortage) and discontinue 3 times daily loperamide.  Further plans pending the above.  The risks and benefits as well as alternatives of endoscopic procedure(s) have been discussed and reviewed. All questions answered. The patient agrees to proceed. I believe the patient understands what I explained as far as informed consent is concerned.  I did attempt to call her daughter Lenna Sciara but could not reach her.   Gatha Mayer, MD, [facility] See AMION on call - gastroenterology for best contact person 12/27/2022 3:33 PM         HPI:   Kara Bailey is a 67 y.o. female with a history of ischemic stroke and severe dysarthria and dysphagia who was admitted in late January because of failure to thrive, she had a percutaneous gastrostomy tube placed.  Recently she  has had a decline in her hemoglobin and reported dark stools yesterday though not clearly melanic, with brown stools today.  Stool is heme positive.  Hemoglobin declined from 9 on February 5 2-7.3 on February 13 and 6.3 this morning.  She has been transfused a unit of packed red cells and hemoglobin is 7.7.  Her Plavix and aspirin were held.  She is complaining of some mild abdominal pain as well.  She is able to provide history though speech is not normal it is understandable that she is able to communicate with me.  In 2021 she had gastritis and an esophageal stricture dilated to 15.5 mm by Dr. Loletha Carrow.  Dr. Gala Romney removed a 9 mm sigmoid adenoma in 2017.  Colonoscopy otherwise without significant pathology.  As far as diarrhea here she has been treated with loperamide and Lomotil.  Loperamide 2 mg every 8 hours and Lomotil 2.5 mg every 6 hours or 4 times daily.  This has helped somewhat.  C. difficile testing was requested but denied by ID diarrhea usage does seem to correlate with liquid tube nutrition use.  Past Medical History:  Diagnosis Date   Acute CVA (cerebrovascular accident) ([place]) 12/29/2020   Anxiety    Chest pain 08/2014   unspecified   Cirrhosis ([place])    CVA (cerebral vascular accident) ([place]) 07/27/2022   Depression    Dizziness 08/2014   Dyspnea 08/2014   Dysrhythmia    Emphysema lung (HCC)    GERD (gastroesophageal reflux disease)    Heart murmur    Hepatitis C    Hiatal hernia    Hypertension    Ischemic stroke (  [place]) 08/01/2022   PAC (premature atrial contraction) 10/11/2014   PAD (peripheral artery disease) (HCC)    Palpitations    PVC (premature ventricular contraction) 10/11/2014   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   Stroke ([place])    just slurred speech   Tachycardia 08/2014   Ventral hernia    Weakness 08/2014    Past Surgical History:  Procedure Laterality Date   ABDOMINAL AORTOGRAM W/LOWER EXTREMITY N/A 07/13/2021   Procedure: ABDOMINAL  AORTOGRAM W/LOWER EXTREMITY;  Surgeon: Lorretta Harp, MD;  Location: [place] [facility];  Service: Cardiovascular;  Laterality: N/A;   ABDOMINAL AORTOGRAM W/LOWER EXTREMITY N/A 03/19/2022   Procedure: ABDOMINAL AORTOGRAM W/LOWER EXTREMITY;  Surgeon: Lorretta Harp, MD;  Location: [facility];  Service: Cardiovascular;  Laterality: N/A;   APPENDECTOMY     CATARACT EXTRACTION, BILATERAL     COLONOSCOPY     COLONOSCOPY WITH PROPOFOL N/A 04/30/2016   Procedure: COLONOSCOPY WITH PROPOFOL;  Surgeon: Daneil Dolin, MD;  [facility];  Service: Endoscopy;  Laterality: N/A;  [place] N/A 10/13/2021   Procedure: OPEN REPAIR EPIGASTRIC HERNIA WITH MESH PATCH;  Surgeon: Armandina Gemma, MD;  Location: [facility];  Service: General;  Laterality: N/A;   ESOPHAGOGASTRODUODENOSCOPY     approximately 2010   ESOPHAGOGASTRODUODENOSCOPY (EGD) WITH PROPOFOL N/A 04/30/2016   Procedure: ESOPHAGOGASTRODUODENOSCOPY (EGD) WITH PROPOFOL;  Surgeon: Daneil Dolin, MD;  [facility];  Service: Endoscopy;  Laterality: N/A;   IR GASTROSTOMY TUBE MOD SED  12/03/2022   LEFT HEART CATHETERIZATION WITH CORONARY ANGIOGRAM N/A 10/29/2014   07-14-20- pt denies this Procedure: LEFT HEART CATHETERIZATION WITH CORONARY ANGIOGRAM;  Surgeon: Burnell Blanks, MD;  Location: [facility];  Service: Cardiovascular;  Laterality: N/A;   MALONEY DILATION N/A 04/30/2016   Procedure: Venia Minks DILATION;  Surgeon: Daneil Dolin, MD;  [facility];  Service: Endoscopy;  Laterality: N/A;   PERIPHERAL VASCULAR INTERVENTION  07/13/2021   Procedure: PERIPHERAL VASCULAR INTERVENTION;  Surgeon: Lorretta Harp, MD;  Location: [facility] [facility];  Service: Cardiovascular;;  left SFA left external iliac   PERIPHERAL VASCULAR INTERVENTION Right 03/19/2022   Procedure: PERIPHERAL VASCULAR INTERVENTION;  Surgeon: Lorretta Harp, MD;  Location: [place] [facility];  Service: Cardiovascular;   Laterality: Right;  SFA   POLYPECTOMY  04/30/2016   Procedure: POLYPECTOMY;  Surgeon: Daneil Dolin, MD;  [facility];  Service: Endoscopy;;  Sigmoid colon polyp removed via hot snare   UPPER GASTROINTESTINAL ENDOSCOPY      Family History  Problem Relation Age of Onset   Heart failure Mother    Heart attack Mother    Heart disease Mother    Hypertension Mother    Hyperlipidemia Mother    CVA Father    Alzheimer's disease Father    Cancer Neg Hx    Colon cancer Neg Hx    Esophageal cancer Neg Hx    Rectal cancer Neg Hx    Stomach cancer Neg Hx     Social History   Tobacco Use   Smoking status: Every Day    Packs/day: 0.50    Years: 40.00    Total pack years: 20.00    Types: Cigarettes    Start date: 11/12/1968   Smokeless tobacco: Never   Tobacco comments:    cutting back, wearing patches  Vaping Use   Vaping Use: Never used  Substance Use Topics   Alcohol use:  Not Currently    Comment: beer on weekends (6-pack)   Drug use: No    Prior to Admission medications   Medication Sig Start Date End Date Taking? Authorizing Provider  albuterol (VENTOLIN HFA) 108 (90 Base) MCG/ACT inhaler Inhale 2 puffs into the lungs every 6 (six) hours as needed for wheezing or shortness of breath.   Yes [provider]  aspirin EC 81 MG EC tablet Take 1 tablet (81 mg total) by mouth daily. Swallow whole. 12/31/20  Yes Debbe Odea, MD  atorvastatin (LIPITOR) 80 MG tablet Take 1 tablet (80 mg total) by mouth daily. 09/06/22  Yes Setzer, [address], PA-C  Baclofen 5 MG TABS Take 1 tablet by mouth every 8 (eight) hours as needed (pain). 11/24/22  Yes [provider]  citalopram (CELEXA) 10 MG tablet Take 10 mg by mouth every morning. 10/31/22  Yes [provider]  diltiazem (CARDIZEM CD) 240 MG 24 hr capsule Take 1 capsule (240 mg total) by mouth daily. 09/07/22  Yes Setzer, Ed[address], PA-C  ezetimibe (ZETIA) 10 MG tablet Take 10 mg by mouth daily.   Yes  [provider]  famotidine (PEPCID) 40 MG tablet Take 40 mg by mouth at bedtime. 10/25/20  Yes [provider]  hydroxypropyl methylcellulose / hypromellose (ISOPTO TEARS / GONIOVISC) 2.5 % ophthalmic solution Place 1 drop into both eyes in the morning and at bedtime.   Yes [provider]  hydrOXYzine (ATARAX) 50 MG tablet Take 50 mg by mouth every 8 (eight) hours as needed for anxiety.   Yes [provider]  mirtazapine (REMERON) 15 MG tablet Take 15 mg by mouth at bedtime. 09/27/22  Yes [provider]  Multiple Vitamin (MULTIVITAMIN WITH MINERALS) TABS tablet Take 1 tablet by mouth daily. 12/31/20  Yes Debbe Odea, MD  thiamine (VITAMIN B-1) 100 MG tablet Take 100 mg by mouth daily.   Yes [provider]  acetaminophen (TYLENOL) 325 MG tablet Take 1-2 tablets (325-650 mg total) by mouth every 4 (four) hours as needed for mild pain. Patient not taking: Reported on 11/29/2022 09/06/22   Vaughan Basta, [address], PA-C  ALPRAZolam Duanne Moron) 0.25 MG tablet Take 2 tablets (0.5 mg total) by mouth daily as needed for anxiety. 12/07/22   Shelly Coss, MD  aspirin 81 MG chewable tablet Place 1 tablet (81 mg total) into feeding tube daily. 12/17/22   Florencia Reasons, MD  clopidogrel (PLAVIX) 75 MG tablet Take 1 tablet (75 mg total) by mouth daily. 12/20/22   Lorretta Harp, MD  feeding supplement (ENSURE ENLIVE / ENSURE PLUS) LIQD Take 237 mLs by mouth 2 (two) times daily between meals. 12/07/22   Shelly Coss, MD  magnesium oxide (MAG-OX) 400 (240 Mg) MG tablet Place 2 tablets (800 mg total) into feeding tube 2 (two) times daily. 12/07/22   Shelly Coss, MD  melatonin 3 MG TABS tablet Take 1 tablet (3 mg total) by mouth at bedtime. Patient not taking: Reported on 11/29/2022 09/06/22   Barbie Banner, PA-C  mirtazapine (REMERON) 30 MG tablet Take 0.5 tablets (15 mg total) by mouth at bedtime. Patient not taking: Reported on 11/29/2022 08/13/18   Argentina Donovan,  PA-C  Nutritional Supplements (FEEDING SUPPLEMENT, OSMOLITE 1.5 CAL,) LIQD Place 1,000 mLs into feeding tube continuous. 12/07/22   Shelly Coss, MD  polyethylene glycol (MIRALAX / GLYCOLAX) 17 g packet Take 17 g by mouth daily as needed. Patient not taking: Reported on 11/29/2022 09/06/22   Barbie Banner,  PA-C  senna-docusate (SENOKOT-S) 8.6-50 MG tablet Take 1 tablet by mouth 2 (two) times daily. Patient not taking: Reported on 11/29/2022 09/06/22   Barbie Banner, PA-C  tiZANidine (ZANAFLEX) 2 MG tablet TAKE 1 TABLET(2 MG) BY MOUTH AT BEDTIME Patient not taking: Reported on 11/29/2022 10/15/22   Charlett Blake, MD  traMADol (ULTRAM) 50 MG tablet Take 1 tablet (50 mg total) by mouth every 6 (six) hours as needed for moderate pain. 12/07/22   Shelly Coss, MD  Water For Irrigation, Sterile (FREE WATER) SOLN Place 100 mLs into feeding tube every 4 (four) hours. 12/07/22   Shelly Coss, MD    Current Facility-Administered Medications  Medication Dose Route Frequency Provider Last Rate Last Admin   acetaminophen (TYLENOL) suppository 650 mg  650 mg Rectal Q4H PRN Kristopher Oppenheim, DO   650 mg at 12/07/22 0510   ALPRAZolam (XANAX) tablet 0.25 mg  0.25 mg Per Tube TID PRN Shelly Coss, MD   0.25 mg at 12/27/22 0518   atorvastatin (LIPITOR) tablet 80 mg  80 mg Per Tube QPC supper Shelly Coss, MD   80 mg at 12/26/22 1728   baclofen (LIORESAL) tablet 5 mg  5 mg Per NG tube Q8H PRN Florencia Reasons, MD   5 mg at 12/26/22 1727   citalopram (CELEXA) tablet 10 mg  10 mg Oral q morning Shelly Coss, MD   10 mg at 12/27/22 0941   diclofenac Sodium (VOLTAREN) 1 % topical gel 4 g  4 g Topical QID Kristopher Oppenheim, DO   4 g at 12/26/22 1256   diphenoxylate-atropine (LOMOTIL) 2.5-0.025 MG/5ML liquid 10 mL  10 mL Per Tube QID Gatha Mayer, MD       ezetimibe (ZETIA) tablet 10 mg  10 mg Per Tube Daily Florencia Reasons, MD   10 mg at 12/27/22 0939   feeding supplement (JEVITY 1.5 CAL/FIBER) liquid 1,000 mL  1,000 mL  Per Tube Q24H Shelly Coss, MD   1,000 mL at 12/27/22 0946   feeding supplement (PROSource TF20) liquid 60 mL  60 mL Per Tube TID BM Shelly Coss, MD   60 mL at 12/27/22 1338   free water 100 mL  100 mL Per Tube Q4H Florencia Reasons, MD   100 mL at 12/27/22 1143   guaiFENesin-dextromethorphan (ROBITUSSIN DM) 100-10 MG/5ML syrup 5 mL  5 mL Per Tube Q4H PRN Adhikari, Amrit, MD       iohexol (OMNIPAQUE) 300 MG/ML solution 100 mL  100 mL Intravenous Once PRN Adhikari, Amrit, MD       iohexol (OMNIPAQUE) 300 MG/ML solution 100 mL  100 mL Intravenous Once PRN Shelly Coss, MD       lip balm (CARMEX) ointment   Topical PRN Shelly Coss, MD   75 Application at [id number]   melatonin tablet 3 mg  3 mg Per Tube QHS Florencia Reasons, MD   3 mg at 12/26/22 2034   morphine (PF) 2 MG/ML injection 2 mg  2 mg Intravenous Q4H PRN Shelly Coss, MD   2 mg at 12/26/22 2034   multivitamin with minerals tablet 1 tablet  1 tablet Per Tube Daily Shelly Coss, MD   1 tablet at 12/27/22 0937   ondansetron (ZOFRAN) tablet 4 mg  4 mg Oral Q6H PRN Kristopher Oppenheim, DO   4 mg at 12/12/22 1606   Or   ondansetron (ZOFRAN) injection 4 mg  4 mg Intravenous Q6H PRN Kristopher Oppenheim, DO   4 mg at 12/09/22 2127  oxyCODONE (Oxy IR/ROXICODONE) immediate release tablet 5 mg  5 mg Oral Q6H PRN Shelly Coss, MD   5 mg at 12/27/22 0944   pantoprazole (PROTONIX) injection 40 mg  40 mg Intravenous Q12H Adhikari, Tamsen Meek, MD   40 mg at 12/27/22 1338   phenol (CHLORASEPTIC) mouth spray 1 spray  1 spray Mouth/Throat PRN Florencia Reasons, MD   1 spray at 12/02/22 1738   saccharomyces boulardii (FLORASTOR) capsule 250 mg  250 mg Per Tube BID Florencia Reasons, MD   250 mg at 12/27/22 [id number]   thiamine (VITAMIN B1) tablet 100 mg  100 mg Per Tube Daily Florencia Reasons, MD   100 mg at 12/27/22 [id number]   tiZANidine (ZANAFLEX) tablet 2 mg  2 mg Per Tube BID Florencia Reasons, MD   2 mg at 12/27/22 [id number]    Allergies as of 11/29/2022   (No Known Allergies)     Review of Systems:    This  is positive for those things mentioned in the HPI.   Physical Exam:  Vital signs in last 24 hours: Temp:  [97.7 F (36.5 C)-98.4 F (36.9 C)] 98.4 F (36.9 C) (02/15 1333) Pulse Rate:  [89-109] 95 (02/15 1333) Resp:  [14-20] 20 (02/15 1141) BP: (99-110)/(54-87) 99/64 (02/15 1333) SpO2:  [96 %-100 %] 96 % (02/15 1141) Last BM Date : 12/27/22  General:  Thin chronically ill black woman in no acute distress.  Speech is dysarthric and limited but the words she uses are intelligible.  She communicates by pointing as well. Eyes:  anicteric..  Lungs: Clear to auscultation bilaterally. Heart:   S1S2, no rubs, murmurs, gallops. Abdomen:  soft, non-tender, no hepatosplenomegaly, hernia, or mass and BS+.  Gastrostomy tube in the middle left upper quadrant area-bandaged  Neuro:  Left hemiplegia.  Follows commands appropriately. Psych:  appropriate mood and  Affect.   Data Reviewed:   LAB RESULTS: Recent Labs    12/25/22 0607 12/27/22 0706 12/27/22 1445  WBC 10.2 9.4  --   HGB 7.3* 6.3* 7.7*  HCT 24.6* 20.2* 23.9*  PLT 247 226  --    BMET Recent Labs    12/25/22 0607  NA 139  K 3.7  CL 105  CO2 26  GLUCOSE 119*  BUN 26*  CREATININE 0.58  CALCIUM 8.9    Thanks   LOS: 26 days   @Media [employer]izz[employer]$  Simonne Maffucci, MD, [facility] @  12/27/2022, 3:29 PM

## 2022-12-27 NOTE — Progress Notes (Signed)
PROGRESS NOTE  Kara Bailey  [id number] DOB: 1956-03-20 DOA: 11/29/2022 PCP: Andree Moro, DO   Brief Narrative: Patient is a 67 year old female with history of CVA, hypertension presented the emergency department at the request of her PCP for the evaluation of persistent dysphagia.  Found to have multifactorial dysphagia likely from stroke.  Status post G-tube placement by IR.  PT/OT recommending SNF on discharge.  Developed nausea, vomiting, abdominal pain on 1/26,  CT abdomen/pelvis showed possible cholecystitis, MRCP confirmed acute cholecystitis.  General surgery was  following.  Significant improvement abdominal pain, nausea, vomiting, liver enzymes,abx course completed.  Diet started and she is tolerating.  Prolonged hospitalization.  Her insurance declined SNF so daughter has appealed, decision is pending.  Hospital course remarkable for persistent diarrhea,anemia.  FOBT positive.  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  Has history of esophageal stricture last dilated on 2021.  GI consulted and was following.  Barium swallow done on 11/30/2022 was nondiagnostic.  Underwent G-tube placement by IR on 1/22.Started on full liquid diet. Continue tube feeding diet, full liquid diet.   Abdomen pain, nausea, vomiting/acute cholecystitis: Resolved.  Likely acalculous cholecystitis.  CT abdomen/pelvis, MRCP confirmed acute cholecystitis without stones.  Patient was complaining of   periumbilical abdominal pain . Abdomen remains soft, nondistended, nontender with good bowel sounds.   Liver enzymes were elevated, now normalized.  General surgery following signed off.  Abx changed to Augmentin,now completed  the course .  Diarrhea: She was having diarrhea thought to be from antibiotics and also tube feeding.  We requested nutritionist to change the tube diet.  Also using Lomotil round-the-clock.    Continue probiotics. GI pathogen panel negative.  Continued to have intermittent diarrhea .   CT abdomen/pelvis with contrast did not show any bowel inflammation or any other etiology to explain abdominal pain.  Tried to check C. Difficile but not approved by Dr Johnnye Sima.  Using both Imodium and Lomotil, probiotics.  Diarrhea has slowed down to some extent  Normocytic anemia/possible upper GI bleed: Hemoglobin gradually declined now to the range of 6.  Transfused with a unit of PRBC on 2/15.  FOBT checked and was positive.  Patient also mentioned that her diarrhea was black yesterday.  GI consulted.  Holding aspirin and Plavix.  Started on Protonix IV  Severe protein calorie malnutrition: Nutrition was following.  BMI of 18.  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  On Plavix, aspirin, Lipitor, Zetia at home.  Also on baclofen and Zanaflex for left-sided hemiplegia and spasticity  Hypertension: Currently blood pressure soft/stable.  Not on any medications  Hypomagnesemia/hypokalemia: Continue magnesium supplementation.  Depression: On celexa  Debility/deconditioning: PT/OT recommending SNF on discharge.TOC following.  Insurance declined, family has appealed, pending placement.  Prolonged hospitalization      Nutrition Problem: Severe Malnutrition Etiology: chronic illness, dysphagia (h/o CVA)    DVT prophylaxis:SCDs Start: 11/30/22 0151     Code Status: Full Code  Family Communication: Called daughter [name] on phone on 2/10  Patient status:Inpatient  Patient is from :Home  Anticipated discharge to:SnF  Estimated DC date: Being workup for GI bleed.  Also waiting for bed at SNF.  Insurance declined,  family  appealed   Consultants: GI,IR,surgery  Procedures:G tube placement   Antimicrobials:  Anti-infectives (From admission, onward)    Start     Dose/Rate Route Frequency Ordered Stop   12/11/22 2200  amoxicillin-clavulanate (AUGMENTIN) 400-57 MG/5ML suspension 875 mg        875 mg Per Tube Every 12 hours 12/11/22 1137 12/18/22 1039   12/11/22 1000  amoxicillin-clavulanate (AUGMENTIN) 875-125 MG per tablet 1 tablet  Status:  Discontinued        1 tablet Oral Every 12 hours 12/11/22 0821 12/11/22 1137   12/08/22 2000  piperacillin-tazobactam (ZOSYN) IVPB 3.375 g  Status:  Discontinued        3.375 g 12.5 mL/hr over 240 Minutes Intravenous Every 8 hours 12/08/22 1900 12/11/22 0821   12/07/22 1130  cefadroxil (DURICEF) capsule 500 mg  Status:  Discontinued        500 mg Oral 2 times daily 12/07/22 1033 12/07/22 1033   12/07/22 1130  cefadroxil (DURICEF) capsule 500 mg  Status:  Discontinued        500 mg Oral 2 times daily 12/07/22 1033 12/07/22 1034   12/07/22 1130  cefadroxil (DURICEF) capsule 500 mg  Status:  Discontinued        500 mg Per Tube 2 times daily 12/07/22 1034 12/08/22 1903   12/03/22 0700  ceFAZolin (ANCEF) IVPB 2g/100 mL premix        2 g 200 mL/hr over 30 Minutes Intravenous To Radiology 11/30/22 1602 12/04/22 0700       Subjective: Patient seen and examined at bedside today.  Hemodynamically stable sitting on the bed.  Comfortable.  Denies any abdomen pain.  Had loose stool last night.  Objective: Vitals:   12/26/22 2142 12/27/22 0600 12/27/22 1123 12/27/22 1141  BP: 110/69 101/87 (!) 101/57 (!) 100/54  Pulse: 96 (!) 109 94 89  Resp: 14 20 18 20  $ Temp: 98.2 F (36.8 C) 97.8 F (36.6 C) 97.7 F (36.5 C) 98 F (36.7 C)  TempSrc: Oral Oral Oral Oral  SpO2: 100% 99% 97% 96%  Weight:      Height:        Intake/Output Summary (Last 24 hours) at 12/27/2022 1226 Last data filed at 12/27/2022 1123 Gross per 24 hour  Intake 0 ml  Output --  Net 0 ml    Filed  Weights   12/22/22 2113 12/24/22 0526 12/25/22 0518  Weight: 51.1 kg 52.4 kg 52.7 kg    Examination:    General exam: Overall comfortable, not in distress,weak,deconditioned HEENT: PERRL Respiratory system:  no wheezes or crackles  Cardiovascular system: S1 & S2 heard, RRR.  Gastrointestinal system: Abdomen is nondistended, soft and nontender.G tube Central nervous system: Alert and oriented,left hemiplegia Extremities: No edema, no clubbing ,no cyanosis Skin: No rashes, no ulcers,no icterus    Data Reviewed: I have personally reviewed following labs and imaging studies  CBC: Recent Labs  Lab 12/25/22 0607 12/27/22 0706  WBC 10.2 9.4  HGB 7.3* 6.3*  HCT 24.6* 20.2*  MCV 96.1 94.4  PLT 247 [id number]    Basic Metabolic Panel: Recent Labs  Lab 12/25/22 0607  NA 139  K 3.7  CL 105  CO2 26  GLUCOSE 119*  BUN 26*  CREATININE 0.58  CALCIUM 8.9     Recent Results (from the past 240 hour(s))  Gastrointestinal Panel by PCR , Stool     Status: None   Collection Time: 12/23/22 11:33 AM   Specimen: Stool  Result  Value Ref Range Status   Campylobacter species NOT DETECTED NOT DETECTED Final   Plesimonas shigelloides NOT DETECTED NOT DETECTED Final   Salmonella species NOT DETECTED NOT DETECTED Final   Yersinia enterocolitica NOT DETECTED NOT DETECTED Final   Vibrio species NOT DETECTED NOT DETECTED Final   Vibrio cholerae NOT DETECTED NOT DETECTED Final   Enteroaggregative E coli (EAEC) NOT DETECTED NOT DETECTED Final   Enteropathogenic E coli (EPEC) NOT DETECTED NOT DETECTED Final   Enterotoxigenic E coli (ETEC) NOT DETECTED NOT DETECTED Final   Shiga like toxin producing E coli (STEC) NOT DETECTED NOT DETECTED Final   Shigella/Enteroinvasive E coli (EIEC) NOT DETECTED NOT DETECTED Final   Cryptosporidium NOT DETECTED NOT DETECTED Final   Cyclospora cayetanensis NOT DETECTED NOT DETECTED Final   Entamoeba histolytica NOT DETECTED NOT DETECTED Final   Giardia lamblia NOT  DETECTED NOT DETECTED Final   Adenovirus F40/41 NOT DETECTED NOT DETECTED Final   Astrovirus NOT DETECTED NOT DETECTED Final   Norovirus GI/GII NOT DETECTED NOT DETECTED Final   Rotavirus A NOT DETECTED NOT DETECTED Final   Sapovirus (I, II, IV, and V) NOT DETECTED NOT DETECTED Final    Comment: Performed at [facility], [address], [place], [place] [address]      Radiology Studies: No results found.  Scheduled Meds:  atorvastatin  80 mg Per Tube QPC supper   citalopram  10 mg Oral q morning   diclofenac Sodium  4 g Topical QID   diphenoxylate-atropine  1 tablet Per Tube QID   ezetimibe  10 mg Per Tube Daily   famotidine  10.4 mg Per Tube QHS   feeding supplement (JEVITY 1.5 CAL/FIBER)  1,000 mL Per Tube Q24H   feeding supplement (PROSource TF20)  60 mL Per Tube TID BM   free water  100 mL Per Tube Q4H   loperamide  2 mg Oral Q8H   melatonin  3 mg Per Tube QHS   multivitamin with minerals  1 tablet Per Tube Daily   pantoprazole (PROTONIX) IV  40 mg Intravenous Q12H   saccharomyces boulardii  250 mg Per Tube BID   thiamine  100 mg Per Tube Daily   tiZANidine  2 mg Per Tube BID   Continuous Infusions:     LOS: 26 days   Shelly Coss, MD [facility] P2/15/2024, 12:26 PM

## 2022-12-28 ENCOUNTER — Encounter (HOSPITAL_COMMUNITY)
Admission: EM | Disposition: A | Discharge status: Skilled Nursing Facility | Payer: Self-pay | Source: Home / Self Care | Attending: Internal Medicine

## 2022-12-28 ENCOUNTER — Inpatient Hospital Stay (HOSPITAL_COMMUNITY): Payer: Medicare HMO | Admitting: Anesthesiology

## 2022-12-28 ENCOUNTER — Encounter (HOSPITAL_COMMUNITY): Payer: Self-pay | Admitting: Internal Medicine

## 2022-12-28 DIAGNOSIS — F17210 Nicotine dependence, cigarettes, uncomplicated: Secondary | ICD-10-CM

## 2022-12-28 DIAGNOSIS — I10 Essential (primary) hypertension: Secondary | ICD-10-CM | POA: Diagnosis not present

## 2022-12-28 DIAGNOSIS — J449 Chronic obstructive pulmonary disease, unspecified: Secondary | ICD-10-CM | POA: Diagnosis not present

## 2022-12-28 DIAGNOSIS — R195 Other fecal abnormalities: Secondary | ICD-10-CM | POA: Diagnosis not present

## 2022-12-28 DIAGNOSIS — I69391 Dysphagia following cerebral infarction: Secondary | ICD-10-CM | POA: Diagnosis not present

## 2022-12-28 DIAGNOSIS — K649 Unspecified hemorrhoids: Secondary | ICD-10-CM | POA: Diagnosis not present

## 2022-12-28 DIAGNOSIS — Z931 Gastrostomy status: Secondary | ICD-10-CM

## 2022-12-28 DIAGNOSIS — R710 Precipitous drop in hematocrit: Secondary | ICD-10-CM

## 2022-12-28 HISTORY — PX: ESOPHAGOGASTRODUODENOSCOPY (EGD) WITH PROPOFOL: SHX5813

## 2022-12-28 LAB — TYPE AND SCREEN
ABO/RH(D): A POS
Antibody Screen: NEGATIVE
Unit division: 0

## 2022-12-28 LAB — CBC
HCT: 27.4 % — ABNORMAL LOW (ref 36.0–46.0)
Hemoglobin: 8.7 g/dL — ABNORMAL LOW (ref 12.0–15.0)
MCH: 29.7 pg (ref 26.0–34.0)
MCHC: 31.8 g/dL (ref 30.0–36.0)
MCV: 93.5 fL (ref 80.0–100.0)
Platelets: 236 10*3/uL (ref 150–400)
RBC: 2.93 MIL/uL — ABNORMAL LOW (ref 3.87–5.11)
RDW: 18.9 % — ABNORMAL HIGH (ref 11.5–15.5)
WBC: 9.6 10*3/uL (ref 4.0–10.5)
nRBC: 0 % (ref 0.0–0.2)

## 2022-12-28 LAB — BPAM RBC
Blood Product Expiration Date: 202403092359
ISSUE DATE / TIME: 202402151121
Unit Type and Rh: 6200

## 2022-12-28 SURGERY — ESOPHAGOGASTRODUODENOSCOPY (EGD) WITH PROPOFOL
Anesthesia: Monitor Anesthesia Care

## 2022-12-28 MED ORDER — PANTOPRAZOLE SODIUM 40 MG PO TBEC
40.0000 mg | DELAYED_RELEASE_TABLET | Freq: Every day | ORAL | Status: DC
  Administered 2022-12-29 – 2023-01-08 (×11): 40 mg via ORAL
  Filled 2022-12-28 (×11): qty 1

## 2022-12-28 MED ORDER — PROPOFOL 500 MG/50ML IV EMUL
INTRAVENOUS | Status: DC | PRN
  Administered 2022-12-28: 100 ug/kg/min via INTRAVENOUS

## 2022-12-28 MED ORDER — LIDOCAINE 2% (20 MG/ML) 5 ML SYRINGE
INTRAMUSCULAR | Status: DC | PRN
  Administered 2022-12-28: 80 mg via INTRAVENOUS

## 2022-12-28 MED ORDER — LACTATED RINGERS IV SOLN
INTRAVENOUS | Status: AC | PRN
  Administered 2022-12-28: 1000 mL via INTRAVENOUS

## 2022-12-28 MED ORDER — PROPOFOL 10 MG/ML IV BOLUS
INTRAVENOUS | Status: DC | PRN
  Administered 2022-12-28: 40 mg via INTRAVENOUS

## 2022-12-28 SURGICAL SUPPLY — 15 items

## 2022-12-28 NOTE — TOC Progression Note (Signed)
Transition of Care [facility]) - Progression Note    Patient Details  Name: Kara Bailey MRN: [id number] Date of Birth: [birth year]  Transition of Care [facility]) CM/SW [place], RN Phone [phone]  12/28/2022, 4:10 PM  Clinical Narrative:    Daughter Harriette Ohara has completed the long term medicaid application. CM will follow up on Monday for possible LOG for placement.    Expected Discharge Plan: Skilled Nursing Facility Barriers to Discharge: SNF Authorization Denied  Expected Discharge Plan and Services In-house Referral: NA Discharge Planning Services: CM Consult Post Acute Care Choice: [place] Living arrangements for the past 2 months: Apartment Expected Discharge Date: 12/07/22               DME Arranged: N/A DME Agency: NA       HH Arranged: NA HH Agency: NA         Social Determinants of Health (SDOH) Interventions SDOH Screenings   Food Insecurity: Unknown (12/04/2022)  Housing: Low Risk  (12/04/2022)  Transportation Needs: Unknown (12/04/2022)  Utilities: Unknown (12/04/2022)  Depression (PHQ2-9): High Risk (09/19/2022)  Tobacco Use: High Risk (12/28/2022)    Readmission Risk Interventions    12/17/2022    1:42 PM  Readmission Risk Prevention Plan  Transportation Screening Complete  PCP or Specialist Appt within 3-5 Days Complete  HRI or [place] Complete  Social Work Consult for [place] Planning/Counseling [place] Not Applicable  Medication Review Press photographer) Referral to Pharmacy

## 2022-12-28 NOTE — Progress Notes (Signed)
PT Cancellation Note  Patient Details Name: Kara Bailey MRN: [id number] DOB: May 10, 1956   Cancelled Treatment:    Reason Eval/Treat Not Completed: Patient at procedure or test/unavailable. Per chart review, pt going for EGD today. Will f/u as able.    Talbot Grumbling PT, DPT 12/28/22, 9:43 AM

## 2022-12-28 NOTE — Interval H&P Note (Signed)
History and Physical Interval Note:  12/28/2022 12:25 PM  Kara Bailey  has presented today for surgery, with the diagnosis of heme + stool, ? upper GI bleed.  The various methods of treatment have been discussed with the patient and family. After consideration of risks, benefits and other options for treatment, the patient has consented to  Procedure(s): ESOPHAGOGASTRODUODENOSCOPY (EGD) WITH PROPOFOL (N/A) as a surgical intervention.  The patient's history has been reviewed, patient examined, no change in status, stable for surgery.  I have reviewed the patient's chart and labs.  Questions were answered to the patient's satisfaction.     Silvano Rusk

## 2022-12-28 NOTE — Transfer of Care (Signed)
Immediate Anesthesia Transfer of Care Note  Patient: ZAYDIE IRONS  Procedure(s) Performed: Procedure(s): ESOPHAGOGASTRODUODENOSCOPY (EGD) WITH PROPOFOL (N/A)  Patient Location: PACU  Anesthesia Type:MAC  Level of Consciousness: Patient easily awoken, sedated, comfortable, cooperative, following commands, responds to stimulation.   Airway & Oxygen Therapy: Patient spontaneously breathing, ventilating well, oxygen via simple oxygen mask.  Post-op Assessment: Report given to PACU RN, vital signs reviewed and stable, moving all extremities.   Post vital signs: Reviewed and stable.  Complications: No apparent anesthesia complications Last Vitals:  Vitals Value Taken Time  BP    Temp    Pulse 108 12/28/22 1304  Resp 14 12/28/22 1304  SpO2 100 % 12/28/22 1304  Vitals shown include unvalidated device data.  Last Pain:  Vitals:   12/28/22 1131  TempSrc: Tympanic  PainSc: 0-No pain      Patients Stated Pain Goal: 4 ([phone] [id number])  Complications: No notable events documented.

## 2022-12-28 NOTE — Progress Notes (Signed)
Nutrition Follow-up  DOCUMENTATION CODES:   Severe malnutrition in context of chronic illness  INTERVENTION:   -Continue Jevity 1.5 @ goal rate of 40 ml/hr via PEG -60 ml Prosource TF 20 TID -Provides 1680 kcals, 121g protein and 729 ml H2O, 20g fiber daily -Free water: 100 ml every 4 hours (600 ml)    -Multivitamin with minerals daily (crushed pill, no liquid d/t osmolality) -100 mg Thiamine daily  NUTRITION DIAGNOSIS:   Severe Malnutrition related to chronic illness, dysphagia (h/o CVA) as evidenced by moderate fat depletion, severe muscle depletion, energy intake < or equal to 75% for > or equal to 1 month.  Ongoing.  GOAL:   Patient will meet greater than or equal to 90% of their needs  Meeting with TF.  MONITOR:   PO intake, Supplement acceptance, Labs, Weight trends, I & O's  REASON FOR ASSESSMENT:   Consult Enteral/tube feeding initiation and management  ASSESSMENT:   67 year old African-American female history of multiple strokes, dense left hemiparesis, hypertension who presents to the ER today with worsening dysphagia.  Patient having EGD today given recent dark stools and possible upper GI bleed. GI has adjusted dosage of Lomotil. Pt with no BM yet today.  Will continue to monitor.  Admission weight: 112 lbs Current weight: 101 lbs   Medications: Lomotil, Multivitamin with minerals daily, Florastor, Thiamine, Lactated ringers  Labs reviewed.  Diet Order:   Diet Order             Diet NPO time specified Except for: Sips with Meds  Diet effective midnight           Diet general                   EDUCATION NEEDS:   Education needs have been addressed  Skin:  Skin Assessment: Reviewed RN Assessment  Last BM:  2/16 -type 6  Height:   Ht Readings from Last 1 Encounters:  12/28/22 5' 6"$  (1.676 m)    Weight:   Wt Readings from Last 1 Encounters:  12/28/22 45.9 kg    BMI:  Body mass index is 16.33 kg/m.  Estimated Nutritional  Needs:   Kcal:  [id number]  Protein:  80-95g  Fluid:  1.8L/day  Clayton Bibles, MS, RD, LDN Inpatient Clinical Dietitian Contact information available via Amion

## 2022-12-28 NOTE — Progress Notes (Signed)
PROGRESS NOTE  Kara Bailey  [id number] DOB: 1955-11-29 DOA: 11/29/2022 PCP: Andree Moro, DO   Brief Narrative: Patient is a 67 year old female with history of CVA, hypertension presented the emergency department at the request of her PCP for the evaluation of persistent dysphagia.  Found to have multifactorial dysphagia likely from stroke.  Status post G-tube placement by IR.  PT/OT recommending SNF on discharge.  Developed nausea, vomiting, abdominal pain on 1/26,  CT abdomen/pelvis showed possible cholecystitis, MRCP confirmed acute cholecystitis.  General surgery was  following.  Significant improvement abdominal pain, nausea, vomiting, liver enzymes,abx course completed.  Diet started and she is tolerating.  Prolonged hospitalization.  Her insurance declined SNF so daughter has appealed, decision is pending.  Hospital course remarkable for persistent diarrhea,anemia.  FOBT positive.  GI consulted,plan for egd  Assessment & Plan:  Principal Problem:   Dysphagia as late effect of cerebrovascular accident (CVA) Active Problems:   Essential hypertension   Left hemiplegia (HCC)   History of stroke   Dysphagia   Protein-calorie malnutrition, severe  Dysphagia: Likely secondary to residual effect of the ischemic stroke.  Has history of esophageal stricture last dilated on 2021.  GI consulted and was following.  Barium swallow done on 11/30/2022 was nondiagnostic.  Underwent G-tube placement by IR on 1/22.Started on full liquid diet. Continue tube feeding diet, full liquid diet.   Abdomen pain, nausea, vomiting/acute cholecystitis: Resolved.  Likely acalculous cholecystitis.  CT abdomen/pelvis, MRCP confirmed acute cholecystitis without stones.  Patient was complaining of   periumbilical abdominal pain . Abdomen remains soft, nondistended, nontender with good bowel sounds.   Liver enzymes were elevated, now normalized.  General surgery following signed off.  Abx changed to Augmentin,now  completed the course .  Diarrhea: She was having diarrhea thought to be from antibiotics and also tube feeding.  We requested nutritionist to change the tube diet.  Also using Lomotil round-the-clock.    Continue probiotics. GI pathogen panel negative.  Continued to have intermittent diarrhea .   CT abdomen/pelvis with contrast did not show any bowel inflammation or any other etiology to explain abdominal pain.  Tried to check C. Difficile but not approved by Dr Johnnye Sima.  Using both Imodium and Lomotil, probiotics.  Diarrhea has slowed down   Normocytic anemia/possible upper GI bleed: Hemoglobin gradually declined  to the range of 6.  Transfused with a unit of PRBC on 2/15,Hb in the range of 8 today.  FOBT checked and was positive.  Patient also mentioned that her diarrhea was black on 2/14.  GI consulted.  Holding aspirin and Plavix.  Started on Protonix IV,plan for EGD  Severe protein calorie malnutrition: Nutrition was following.  BMI of 18.  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  On Plavix, aspirin, Lipitor, Zetia at home.  Also on baclofen and Zanaflex for left-sided hemiplegia and spasticity  Hypertension: Currently blood pressure soft/stable.  Not on any medications  Hypomagnesemia/hypokalemia: Continue magnesium supplementation.  Depression: On celexa  Debility/deconditioning: PT/OT recommending SNF on discharge.TOC following.  Insurance declined, family has appealed, pending placement.  Prolonged hospitalization      Nutrition Problem: Severe Malnutrition Etiology: chronic illness, dysphagia (h/o CVA)    DVT prophylaxis:SCDs Start: 11/30/22 0151     Code Status: Full Code  Family Communication: Called daughter [name] on phone on 2/16  Patient status:Inpatient  Patient is from :Home  Anticipated discharge to:SnF  Estimated DC date: Being workup for GI bleed.  Also  waiting for bed at SNF.  Insurance declined,  family appealed   Consultants: GI,IR,surgery  Procedures:G tube placement   Antimicrobials:  Anti-infectives (From admission, onward)    Start     Dose/Rate Route Frequency Ordered Stop   12/11/22 2200  amoxicillin-clavulanate (AUGMENTIN) 400-57 MG/5ML suspension 875 mg        875 mg Per Tube Every 12 hours 12/11/22 1137 12/18/22 1039   12/11/22 1000  amoxicillin-clavulanate (AUGMENTIN) 875-125 MG per tablet 1 tablet  Status:  Discontinued        1 tablet Oral Every 12 hours 12/11/22 0821 12/11/22 1137   12/08/22 2000  piperacillin-tazobactam (ZOSYN) IVPB 3.375 g  Status:  Discontinued        3.375 g 12.5 mL/hr over 240 Minutes Intravenous Every 8 hours 12/08/22 1900 12/11/22 0821   12/07/22 1130  cefadroxil (DURICEF) capsule 500 mg  Status:  Discontinued        500 mg Oral 2 times daily 12/07/22 1033 12/07/22 1033   12/07/22 1130  cefadroxil (DURICEF) capsule 500 mg  Status:  Discontinued        500 mg Oral 2 times daily 12/07/22 1033 12/07/22 1034   12/07/22 1130  cefadroxil (DURICEF) capsule 500 mg  Status:  Discontinued        500 mg Per Tube 2 times daily 12/07/22 1034 12/08/22 1903   12/03/22 0700  ceFAZolin (ANCEF) IVPB 2g/100 mL premix        2 g 200 mL/hr over 30 Minutes Intravenous To Radiology 11/30/22 1602 12/04/22 0700       Subjective: Patient seen and examined at bedside today.  Hemodynamically stable.  No loose bowel movement this morning.  No abdominal pain.  Waiting for EGD .no new episodes of melena or hematochezia Objective: Vitals:   12/27/22 1333 12/27/22 2131 12/28/22 0544 12/28/22 1131  BP: 99/64 127/60 129/75 (!) 128/53  Pulse: 95 95 95 97  Resp: 18 16 16 20  $ Temp: 98.4 F (36.9 C) 97.8 F (36.6 C) 97.8 F (36.6 C) 98.7 F (37.1 C)  TempSrc: Oral Oral Oral Tympanic  SpO2:  98% 99% 96%  Weight:   45.9 kg 45.9 kg  Height:    5' 6"$  (1.676 m)    Intake/Output Summary (Last 24 hours) at 12/28/2022 1232 Last data filed at 12/28/2022 0700 Gross per  24 hour  Intake 314.58 ml  Output 2 ml  Net 312.58 ml    Filed Weights   12/25/22 0518 12/28/22 0544 12/28/22 1131  Weight: 52.7 kg 45.9 kg 45.9 kg    Examination:    General exam: Overall comfortable, not in distress, weak, deconditioned HEENT: PERRL Respiratory system:  no wheezes or crackles  Cardiovascular system: S1 & S2 heard, RRR.  Gastrointestinal system: Abdomen is nondistended, soft and nontender.  G-tube Central nervous system: Alert and oriented, left hemiplegia, contracture on the left upper extremity Extremities: No edema, no clubbing ,no cyanosis Skin: No rashes, no ulcers,no icterus    Data Reviewed: I have personally reviewed following labs and imaging studies  CBC: Recent Labs  Lab 12/25/22 0607 12/27/22 0706 12/27/22 1445 12/28/22 0623  WBC 10.2 9.4  --  9.6  HGB 7.3* 6.3* 7.7* 8.7*  HCT 24.6* 20.2* 23.9* 27.4*  MCV 96.1 94.4  --  93.5  PLT 247 226  --  [id number]    Basic Metabolic Panel: Recent Labs  Lab 12/25/22 0607  NA 139  K 3.7  CL 105  CO2 26  GLUCOSE 119*  BUN 26*  CREATININE 0.58  CALCIUM 8.9     Recent Results (from the past 240 hour(s))  Gastrointestinal Panel by PCR , Stool     Status: None   Collection Time: 12/23/22 11:33 AM   Specimen: Stool  Result Value Ref Range Status   Campylobacter species NOT DETECTED NOT DETECTED Final   Plesimonas shigelloides NOT DETECTED NOT DETECTED Final   Salmonella species NOT DETECTED NOT DETECTED Final   Yersinia enterocolitica NOT DETECTED NOT DETECTED Final   Vibrio species NOT DETECTED NOT DETECTED Final   Vibrio cholerae NOT DETECTED NOT DETECTED Final   Enteroaggregative E coli (EAEC) NOT DETECTED NOT DETECTED Final   Enteropathogenic E coli (EPEC) NOT DETECTED NOT DETECTED Final   Enterotoxigenic E coli (ETEC) NOT DETECTED NOT DETECTED Final   Shiga like toxin producing E coli (STEC) NOT DETECTED NOT DETECTED Final   Shigella/Enteroinvasive E coli (EIEC) NOT DETECTED NOT DETECTED  Final   Cryptosporidium NOT DETECTED NOT DETECTED Final   Cyclospora cayetanensis NOT DETECTED NOT DETECTED Final   Entamoeba histolytica NOT DETECTED NOT DETECTED Final   Giardia lamblia NOT DETECTED NOT DETECTED Final   Adenovirus F40/41 NOT DETECTED NOT DETECTED Final   Astrovirus NOT DETECTED NOT DETECTED Final   Norovirus GI/GII NOT DETECTED NOT DETECTED Final   Rotavirus A NOT DETECTED NOT DETECTED Final   Sapovirus (I, II, IV, and V) NOT DETECTED NOT DETECTED Final    Comment: Performed at [facility], [address], [place], [place] [address]      Radiology Studies: No results found.  Scheduled Meds:  [MAR Hold] atorvastatin  80 mg Per Tube QPC supper   [MAR Hold] citalopram  10 mg Oral q morning   [MAR Hold] diclofenac Sodium  4 g Topical QID   [MAR Hold] diphenoxylate-atropine  2 tablet Per Tube QID   [MAR Hold] ezetimibe  10 mg Per Tube Daily   [MAR Hold] feeding supplement (JEVITY 1.5 CAL/FIBER)  1,000 mL Per Tube Q24H   [MAR Hold] feeding supplement (PROSource TF20)  60 mL Per Tube TID BM   [MAR Hold] free water  100 mL Per Tube Q4H   [MAR Hold] melatonin  3 mg Per Tube QHS   [MAR Hold] multivitamin with minerals  1 tablet Per Tube Daily   [MAR Hold] pantoprazole (PROTONIX) IV  40 mg Intravenous Q12H   [MAR Hold] saccharomyces boulardii  250 mg Per Tube BID   [MAR Hold] thiamine  100 mg Per Tube Daily   [MAR Hold] tiZANidine  2 mg Per Tube BID   Continuous Infusions:  lactated ringers 10 mL/hr at 12/28/22 1227      LOS: 27 days   Shelly Coss, MD [facility] P2/16/2024, 12:32 PM

## 2022-12-28 NOTE — Anesthesia Preprocedure Evaluation (Addendum)
Anesthesia Evaluation  Patient identified by MRN, date of birth, ID band Patient awake    Reviewed: Allergy & Precautions, NPO status , Patient's Chart, lab work & pertinent test results  Airway Mallampati: II  TM Distance: >3 FB Neck ROM: Full    Dental  (+) Upper Dentures, Lower Dentures   Pulmonary COPD,  COPD inhaler, Current Smoker   Pulmonary exam normal        Cardiovascular hypertension, Pt. on medications + Peripheral Vascular Disease   Rhythm:Regular Rate:Normal     Neuro/Psych   Anxiety Depression    CVA (2022, slurred speech), Residual Symptoms    GI/Hepatic hiatal hernia,GERD  ,,(+) Hepatitis -, CGIB   Endo/Other  negative endocrine ROS    Renal/GU negative Renal ROS  negative genitourinary   Musculoskeletal negative musculoskeletal ROS (+)    Abdominal Normal abdominal exam  (+)   Peds  Hematology negative hematology ROS (+)   Anesthesia Other Findings   Reproductive/Obstetrics                             Anesthesia Physical Anesthesia Plan  ASA: 3  Anesthesia Plan: MAC   Post-op Pain Management:    Induction: Intravenous  PONV Risk Score and Plan: 2 and Propofol infusion and Treatment may vary due to age or medical condition  Airway Management Planned: Simple Face Mask, Natural Airway and Nasal Cannula  Additional Equipment: None  Intra-op Plan:   Post-operative Plan:   Informed Consent: I have reviewed the patients History and Physical, chart, labs and discussed the procedure including the risks, benefits and alternatives for the proposed anesthesia with the patient or authorized representative who has indicated his/her understanding and acceptance.     Dental advisory given  Plan Discussed with: CRNA  Anesthesia Plan Comments:        Anesthesia Quick Evaluation

## 2022-12-28 NOTE — Op Note (Addendum)
[facility] Patient Name: Kara Bailey Procedure Date: 12/28/2022 MRN: [id number] Attending MD: Gatha Mayer , MD, [id number] Date of Birth: Sep 30, 1956 CSN: [id number] Age: 67 Admit Type: Inpatient Procedure:                Upper GI endoscopy Indications:              Heme positive stool, Anemia Providers:                Gatha Mayer, MD, Jamison Neighbor RN, RN, Gloris Ham, Technician Referring MD:              Medicines:                Monitored Anesthesia Care Complications:            No immediate complications. Estimated Blood Loss:     Estimated blood loss: none. Procedure:                Pre-Anesthesia Assessment:                           - Prior to the procedure, a History and Physical                            was performed, and patient medications and                            allergies were reviewed. The patient's tolerance of                            previous anesthesia was also reviewed. The risks                            and benefits of the procedure and the sedation                            options and risks were discussed with the patient.                            All questions were answered, and informed consent                            was obtained. Prior Anticoagulants: The patient                            last took Plavix (clopidogrel) 2 days prior to the                            procedure. ASA Grade Assessment: III - A patient                            with severe systemic disease. After reviewing the  risks and benefits, the patient was deemed in                            satisfactory condition to undergo the procedure.                           After obtaining informed consent, the endoscope was                            passed under direct vision. Throughout the                            procedure, the patient's blood pressure, pulse, and                             oxygen saturations were monitored continuously. The                            [id number] Olympus endoscope was introduced                            through the mouth, and advanced to the second part                            of duodenum. The upper GI endoscopy was                            accomplished without difficulty. The patient                            tolerated the procedure well. Scope In: Scope Out: Findings:      There was evidence of a gastrostomy present on the anterior wall of the       stomach. This was characterized by healthy appearing mucosa.      The exam was otherwise without abnormality.      The cardia and gastric fundus were normal on retroflexion. Impression:               - Gastrostomy present characterized by healthy                            appearing mucosa.                           - The examination was otherwise normal.                           - No specimens collected. Moderate Sedation:      Not Applicable - Patient had care per Anesthesia. Recommendation:           - Patient has a contact number available for                            emergencies. The signs and symptoms of potential  delayed complications were discussed with the                            patient. Return to normal activities tomorrow.                            Written discharge instructions were provided to the                            patient.                           - Resume previous diet.                           - Continue present medications.                           - She has a prolapsed hemorrhoid on rectal exam -                            cause of heme + stool                           I think anemia multifactorial and do not plan                            colonoscopy.                           Resume diet, tube feeds, Plavix and ASA. 40 mg pr                            protonix qd as prophylaxis w/ ASA and Plavix                            I increased lomotilk to 2 tabs qid to treat tube                            feed diarrhea - she is fearful of eating due to                            diarrhea but i think if she eats more will be less                            reliant on tube feeds and then will have less                            diarrhea                           signing off Procedure Code(s):        --- Professional ---  [id number], Esophagogastroduodenoscopy, flexible,                            transoral; diagnostic, including collection of                            specimen(s) by brushing or washing, when performed                            (separate procedure) Diagnosis Code(s):        --- Professional ---                           Z93.1, Gastrostomy status                           R19.5, Other fecal abnormalities                           D64.9, Anemia, unspecified CPT copyright 2022 American [employer] Association. All rights reserved. The codes documented in this report are preliminary and upon coder review may  be revised to meet current compliance requirements. Gatha Mayer, MD 12/28/2022 1:10:03 PM This report has been signed electronically. Number of Addenda: 0

## 2022-12-28 NOTE — Anesthesia Procedure Notes (Signed)
Procedure Name: MAC Date/Time: 12/28/2022 12:48 PM  Performed by: Deliah Boston, CRNAPre-anesthesia Checklist: Patient identified, Emergency Drugs available, Suction available and Patient being monitored Patient Re-evaluated:Patient Re-evaluated prior to induction Oxygen Delivery Method: Simple face mask Preoxygenation: Pre-oxygenation with 100% oxygen Placement Confirmation: positive ETCO2 and breath sounds checked- equal and bilateral Dental Injury: Teeth and Oropharynx as per pre-operative assessment

## 2022-12-28 NOTE — Plan of Care (Signed)

## 2022-12-29 DIAGNOSIS — I69391 Dysphagia following cerebral infarction: Secondary | ICD-10-CM | POA: Diagnosis not present

## 2022-12-29 LAB — CBC
HCT: 27.9 % — ABNORMAL LOW (ref 36.0–46.0)
Hemoglobin: 8.8 g/dL — ABNORMAL LOW (ref 12.0–15.0)
MCH: 29.8 pg (ref 26.0–34.0)
MCHC: 31.5 g/dL (ref 30.0–36.0)
MCV: 94.6 fL (ref 80.0–100.0)
Platelets: 268 10*3/uL (ref 150–400)
RBC: 2.95 MIL/uL — ABNORMAL LOW (ref 3.87–5.11)
RDW: 18.7 % — ABNORMAL HIGH (ref 11.5–15.5)
WBC: 8.6 10*3/uL (ref 4.0–10.5)
nRBC: 0 % (ref 0.0–0.2)

## 2022-12-29 LAB — BASIC METABOLIC PANEL
Anion gap: 9 (ref 5–15)
BUN: 21 mg/dL (ref 8–23)
CO2: 27 mmol/L (ref 22–32)
Calcium: 9.1 mg/dL (ref 8.9–10.3)
Chloride: 101 mmol/L (ref 98–111)
Creatinine, Ser: 0.67 mg/dL (ref 0.44–1.00)
GFR, Estimated: >60 mL/min (ref >60)
Glucose, Bld: 102 mg/dL — ABNORMAL HIGH (ref 70–99)
Potassium: 3.9 mmol/L (ref 3.5–5.1)
Sodium: 137 mmol/L (ref 135–145)

## 2022-12-29 MED ORDER — CLOPIDOGREL BISULFATE 75 MG PO TABS
75.0000 mg | ORAL_TABLET | Freq: Every day | ORAL | Status: DC
  Administered 2022-12-29 – 2023-01-08 (×11): 75 mg via ORAL
  Filled 2022-12-29 (×11): qty 1

## 2022-12-29 MED ORDER — ASPIRIN 81 MG PO TBEC
81.0000 mg | DELAYED_RELEASE_TABLET | Freq: Every day | ORAL | Status: DC
  Administered 2022-12-29 – 2023-01-08 (×11): 81 mg via ORAL
  Filled 2022-12-29 (×11): qty 1

## 2022-12-29 NOTE — Progress Notes (Signed)
PROGRESS NOTE    Kara Bailey  [id number] DOB: [birth year] DOA: 11/29/2022 PCP: Andree Moro, DO   Brief Narrative:  Patient is a 67 year old female with history of CVA, hypertension presented the emergency department at the request of her PCP for the evaluation of persistent dysphagia.  Found to have multifactorial dysphagia likely from stroke.  Status post G-tube placement by IR.  PT/OT recommending SNF on discharge.  Developed nausea, vomiting, abdominal pain on 1/26,  CT abdomen/pelvis showed possible cholecystitis, MRCP confirmed acute cholecystitis.  General surgery was  following.  Significant improvement abdominal pain, nausea, vomiting, liver enzymes,abx course completed.  Diet started and she is tolerating.  Prolonged hospitalization.  Her insurance declined SNF so daughter has appealed, decision is pending.  Hospital course remarkable for persistent diarrhea,anemia.  FOBT positive.  GI consulted, underwent EGD on 2/16 Assessment & Plan:   Dysphagia:  -Likely secondary to residual effect of the ischemic stroke.  Has history of esophageal stricture last dilated on 2021.  GI consulted and was following.  Barium swallow done on 11/30/2022 was nondiagnostic.  Underwent G-tube placement by IR on 1/22.Started on full liquid diet. -Underwent EGD on 2/16: Negative for any acute findings.  No specimens collected. -GI recommended to resume aspirin, Plavix.  Prolapsed hemorrhoids noted on rectal exam cause of heme +ve stool.  No plan for colonoscopy.  Continue Protonix 40 mg daily. -GI signed off.   Abdomen pain, nausea, vomiting/acute cholecystitis: Resolved.   -Likely acalculous cholecystitis.   -CT abdomen/pelvis, MRCP confirmed acute cholecystitis without stones.   -General surgery signed off.  Completed Augmentin course.  Diarrhea:  -thought to be from antibiotics and also tube feeding.  We requested nutritionist to change the tube diet.   -GI pathogen panel negative.  CT  abdomen/pelvis with contrast did not show any bowel inflammation or any other etiology to explain abdominal pain.  Tried to check C. Difficile but not approved by Dr Johnnye Sima.  Diarrhea has slowed down  -on  Lomotil and probiotics   Normocytic anemia: Hemoglobin gradually declined  to the range of 6.  Transfused with a unit of PRBC on 2/15,Hb in the range of 8 today.  FOBT checked and was positive.  Patient also mentioned that her diarrhea was black on 2/14.  Per GI- anemia is multifactorial.  External hemorrhoids noted on exam could be the cause of heme positive stool -EGD negative for any acute findings.  Will resume aspirin and Plavix. -H&H is stable this morning  Severe protein calorie malnutrition: Nutrition was following.  BMI of 18.   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  On Plavix, aspirin, Lipitor, Zetia at home.  Also on baclofen and Zanaflex for left-sided hemiplegia and spasticity   Hypertension: Currently blood pressure soft/stable.  Not on any medications   Hypomagnesemia/hypokalemia: Continue magnesium supplementation.   Depression: On celexa   Debility/deconditioning: PT/OT recommending SNF on discharge.TOC following.  Insurance declined, family has appealed, pending placement.  Prolonged hospitalization  DVT prophylaxis:  SCD Code Status: Full code Family Communication:  None present at bedside.  Plan of care discussed with patient in length and she verbalized understanding and agreed with it. Disposition Plan: SNF  Consultants:  GI  Procedures:  EGD  Antimicrobials:  None  Status is: Inpatient    Subjective: Patient seen and examined.  Sitting comfortably on the bed.  Denies any new complaints.  No nausea, vomiting, melena.  No acute events overnight.  Objective: Vitals:  12/28/22 1320 12/28/22 1333 12/28/22 2023 12/29/22 0517  BP: (!) 131/53 133/66 (!) 114/51 101/72  Pulse: 100 (!) 48 75 87   Resp: (!) 23 20 15 16  $ Temp:  97.8 F (36.6 C) 98.2 F (36.8 C) 98.1 F (36.7 C)  TempSrc:  Oral Oral Oral  SpO2: 98% 97% 100% 98%  Weight:      Height:        Intake/Output Summary (Last 24 hours) at 12/29/2022 1408 Last data filed at 12/28/2022 2200 Gross per 24 hour  Intake 592 ml  Output --  Net 592 ml   Filed Weights   12/25/22 0518 12/28/22 0544 12/28/22 1131  Weight: 52.7 kg 45.9 kg 45.9 kg    Examination:  General exam: Appears calm and comfortable, on room air, appears deconditioned and weak Respiratory system: Clear to auscultation. Respiratory effort normal. Cardiovascular system: S1 & S2 heard, RRR. No JVD, murmurs, rubs, gallops or clicks. No pedal edema. Gastrointestinal system: Abdomen is nondistended, soft and nontender. No organomegaly or masses felt. Normal bowel sounds heard.  G-tube noted Central nervous system: Alert and oriented.  Left hemiplegia.  Contractures noted on left upper extremity.  Extremities: Symmetric 5 x 5 power. Skin: No rashes, lesions or ulcers Psychiatry: Judgement and insight appear normal. Mood & affect appropriate.    Data Reviewed: I have personally reviewed following labs and imaging studies  CBC: Recent Labs  Lab 12/25/22 0607 12/27/22 0706 12/27/22 1445 12/28/22 0623 12/29/22 0559  WBC 10.2 9.4  --  9.6 8.6  HGB 7.3* 6.3* 7.7* 8.7* 8.8*  HCT 24.6* 20.2* 23.9* 27.4* 27.9*  MCV 96.1 94.4  --  93.5 94.6  PLT 247 226  --  236 [id number]   Basic Metabolic Panel: Recent Labs  Lab 12/25/22 0607 12/29/22 0559  NA 139 137  K 3.7 3.9  CL 105 101  CO2 26 27  GLUCOSE 119* 102*  BUN 26* 21  CREATININE 0.58 0.67  CALCIUM 8.9 9.1   GFR: Estimated Creatinine Clearance: 48.8 mL/min (by C-G formula based on SCr of 0.67 mg/dL). Liver Function Tests: No results for input(s): "AST", "ALT", "ALKPHOS", "BILITOT", "PROT", "ALBUMIN" in the last 168 hours. No results for input(s): "LIPASE", "AMYLASE" in the last 168 hours. No results  for input(s): "AMMONIA" in the last 168 hours. Coagulation Profile: No results for input(s): "INR", "PROTIME" in the last 168 hours. Cardiac Enzymes: No results for input(s): "CKTOTAL", "CKMB", "CKMBINDEX", "TROPONINI" in the last 168 hours. BNP (last 3 results) No results for input(s): "PROBNP" in the last 8760 hours. HbA1C: No results for input(s): "HGBA1C" in the last 72 hours. CBG: No results for input(s): "GLUCAP" in the last 168 hours. Lipid Profile: No results for input(s): "CHOL", "HDL", "LDLCALC", "TRIG", "CHOLHDL", "LDLDIRECT" in the last 72 hours. Thyroid Function Tests: No results for input(s): "TSH", "T4TOTAL", "FREET4", "T3FREE", "THYROIDAB" in the last 72 hours. Anemia Panel: No results for input(s): "VITAMINB12", "FOLATE", "FERRITIN", "TIBC", "IRON", "RETICCTPCT" in the last 72 hours. Sepsis Labs: No results for input(s): "PROCALCITON", "LATICACIDVEN" in the last 168 hours.  Recent Results (from the past 240 hour(s))  Gastrointestinal Panel by PCR , Stool     Status: None   Collection Time: 12/23/22 11:33 AM   Specimen: Stool  Result Value Ref Range Status   Campylobacter species NOT DETECTED NOT DETECTED Final   Plesimonas shigelloides NOT DETECTED NOT DETECTED Final   Salmonella species NOT DETECTED NOT DETECTED Final   Yersinia enterocolitica NOT DETECTED NOT DETECTED Final  Vibrio species NOT DETECTED NOT DETECTED Final   Vibrio cholerae NOT DETECTED NOT DETECTED Final   Enteroaggregative E coli (EAEC) NOT DETECTED NOT DETECTED Final   Enteropathogenic E coli (EPEC) NOT DETECTED NOT DETECTED Final   Enterotoxigenic E coli (ETEC) NOT DETECTED NOT DETECTED Final   Shiga like toxin producing E coli (STEC) NOT DETECTED NOT DETECTED Final   Shigella/Enteroinvasive E coli (EIEC) NOT DETECTED NOT DETECTED Final   Cryptosporidium NOT DETECTED NOT DETECTED Final   Cyclospora cayetanensis NOT DETECTED NOT DETECTED Final   Entamoeba histolytica NOT DETECTED NOT  DETECTED Final   Giardia lamblia NOT DETECTED NOT DETECTED Final   Adenovirus F40/41 NOT DETECTED NOT DETECTED Final   Astrovirus NOT DETECTED NOT DETECTED Final   Norovirus GI/GII NOT DETECTED NOT DETECTED Final   Rotavirus A NOT DETECTED NOT DETECTED Final   Sapovirus (I, II, IV, and V) NOT DETECTED NOT DETECTED Final    Comment: Performed at [facility], [address] [place], [place] [address]      Radiology Studies: No results found.  Scheduled Meds:  atorvastatin  80 mg Per Tube QPC supper   citalopram  10 mg Oral q morning   diclofenac Sodium  4 g Topical QID   diphenoxylate-atropine  2 tablet Per Tube QID   ezetimibe  10 mg Per Tube Daily   feeding supplement (JEVITY 1.5 CAL/FIBER)  1,000 mL Per Tube Q24H   feeding supplement (PROSource TF20)  60 mL Per Tube TID BM   free water  100 mL Per Tube Q4H   melatonin  3 mg Per Tube QHS   multivitamin with minerals  1 tablet Per Tube Daily   pantoprazole  40 mg Oral QAC breakfast   saccharomyces boulardii  250 mg Per Tube BID   thiamine  100 mg Per Tube Daily   tiZANidine  2 mg Per Tube BID   Continuous Infusions:   LOS: 28 days   Time spent: 35 minutes   Donette Mainwaring Loann Quill, MD [facility]  If 7PM-7AM, please contact night-coverage [email or web address] 12/29/2022, 2:08 PM

## 2022-12-29 NOTE — Plan of Care (Signed)
Patient [name], VSS throughout shift. Slurred speech baseline. Diminished lungs, IS encouraged. All meds given on time as ordered via G-tube. Pt c/o pain relieved by PRN morphine.  Tfs on-going at goal, 42m/hr. Dry gauze dressing intact over G-tube. Pt had bowel and bladder incontinence o/n.  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 POC maintained, will continue to monitor.  Problem: Education: Goal: Knowledge of General Education information will improve Description: Including pain rating scale, medication(s)/side effects and non-pharmacologic comfort measures Outcome: Progressing   Problem: Health Behavior/Discharge Planning: Goal: Ability to manage health-related needs will improve Outcome: Progressing   Problem: Clinical Measurements: Goal: Ability to maintain clinical measurements within normal limits will improve Outcome: Progressing Goal: Will remain free from infection Outcome: Progressing Goal: Diagnostic test results will improve Outcome: Progressing Goal: Respiratory complications will improve Outcome: Progressing Goal: Cardiovascular complication will be avoided Outcome: Progressing   Problem: Activity: Goal: Risk for activity intolerance will decrease Outcome: Progressing   Problem: Nutrition: Goal: Adequate nutrition will be maintained Outcome: Progressing   Problem: Coping: Goal: Level of anxiety will decrease Outcome: Progressing   Problem: Elimination: Goal: Will not experience complications related to bowel motility Outcome: Progressing Goal: Will not experience complications related to urinary retention Outcome: Progressing   Problem: Pain Managment: Goal: General experience of comfort will improve Outcome: Progressing   Problem: Safety: Goal: Ability to remain free from injury will improve Outcome: Progressing   Problem: Skin Integrity: Goal: Risk for impaired skin integrity will decrease Outcome: Progressing

## 2022-12-29 NOTE — Progress Notes (Addendum)
Physical Therapy Treatment Patient Details Name: Kara Bailey MRN: [id number] DOB: Feb 14, 1956 Today's Date: 12/29/2022   History of Present Illness Patient is 67 year old female admitted 11/29/22 with multifactorial dysphagia; s/p PEG tube 12/03/22.  Pt with hx of CVA with L hemiparesis, HTN, iliac stent, hep C, GERD    PT Comments    Pt received supine in bed with no complaints agreeable to be seen, communication primarily by gestures and pointing with occasional short phrases. Pt required mod assist for bed mobility, mod assist +2 for transfers, and max assist +2 for short-distance ambulation with hemi-walker, distance limited by fatigue. Pt unable to advance or stabilize LLE during gait requiring PT support during stance and swing phase. Pt in recliner upon exit, hemi-walker in room, educated RN on usage and transfer status. Discharge destination remains appropriate. We will continue to follow acutely.    Recommendations for follow up therapy are one component of a multi-disciplinary discharge planning process, led by the attending physician.  Recommendations may be updated based on patient status, additional functional criteria and insurance authorization.  Follow Up Recommendations  Skilled nursing-short term rehab (<3 hours/day) Can patient physically be transported by private vehicle: No   Assistance Recommended at Discharge Frequent or constant Supervision/Assistance  Patient can return home with the following A little help with bathing/dressing/bathroom;Assistance with cooking/housework;Help with stairs or ramp for entrance;A lot of help with walking and/or transfers;Assist for transportation   Equipment Recommendations  None recommended by PT    Recommendations for Other Services       Precautions / Restrictions Precautions Precautions: Fall Precaution Comments: hx of CVA last year with L sided weakness; L knee buckles Restrictions Weight Bearing Restrictions: No      Mobility  Bed Mobility Overal bed mobility: Needs Assistance Bed Mobility: Supine to Sit Rolling: Min assist   Supine to sit: Mod assist, HOB elevated Sit to supine: Mod assist   General bed mobility comments: Mod assist to raise trunk. Pt sitting EOB for ~23mn without BUE support, BLE support on floor, no physical assist requried.    Transfers Overall transfer level: Needs assistance Equipment used: 1 person hand held assist Transfers: Sit to/from Stand Sit to Stand: Mod assist, +2 safety/equipment           General transfer comment: Pt required mod assist for lift assistance, +2 for safety, assistance to bring R hand from bed rail to hemi-walker, pt able to stand without PT assistance.    Ambulation/Gait Ambulation/Gait assistance: Max assist, +2 safety/equipment Gait Distance (Feet): 10 Feet Assistive device: Hemi-walker Gait Pattern/deviations: Step-to pattern, Decreased stance time - left, Knees buckling, Knee flexed in stance - left Gait velocity: decreased     General Gait Details: pt ambulated 156ffrom EOB to doorway using hemi-walker in R hand, required max assist for advancement of LLE and blocking of L knee during stance phase as well as weight shifting to R to advance LLE. Distance limited by fatigue. +2 for recliner follow for safety.   Stairs             Wheelchair Mobility    Modified Rankin (Stroke Patients Only)       Balance Overall balance assessment: Needs assistance Sitting-balance support: No upper extremity supported, Feet supported Sitting balance-Leahy Scale: Good Sitting balance - Comments: static sitting, doesn't reach outside BOS   Standing balance support: Single extremity supported Standing balance-Leahy Scale: Poor Standing balance comment: heavily reliant on R UE support static standing  Cognition Arousal/Alertness: Awake/alert Behavior During Therapy: WFL for tasks  assessed/performed Overall Cognitive Status: Within Functional Limits for tasks assessed                                 General Comments: Pt sleeping at start of session        Exercises General Exercises - Lower Extremity Long Arc Quad: Seated, 10 reps, AROM, Right Heel Slides:  (AROM on Rt 10 reps supine) Hip ABduction/ADduction:  (AROM on Rt 10 reps supine)    General Comments        Pertinent Vitals/Pain Pain Assessment Pain Assessment: Faces Faces Pain Scale: Hurts little more Breathing: normal Negative Vocalization: none Facial Expression: smiling or inexpressive Body Language: relaxed Consolability: no need to console PAINAD Score: 0 Pain Location: L shoulder with all movement Pain Descriptors / Indicators: Grimacing, Guarding Pain Intervention(s): Monitored during session, Limited activity within patient's tolerance    Home Living                          Prior Function            PT Goals (current goals can now be found in the care plan section) Acute Rehab PT Goals Patient Stated Goal: go to rehab PT Goal Formulation: With patient Time For Goal Achievement: 12/29/22 Potential to Achieve Goals: Good Progress towards PT goals: Progressing toward goals    Frequency    Min 2X/week      PT Plan Current plan remains appropriate    Co-evaluation              AM-PAC PT "6 Clicks" Mobility   Outcome Measure  Help needed turning from your back to your side while in a flat bed without using bedrails?: A Lot Help needed moving from lying on your back to sitting on the side of a flat bed without using bedrails?: A Lot Help needed moving to and from a bed to a chair (including a wheelchair)?: A Lot Help needed standing up from a chair using your arms (e.g., wheelchair or bedside chair)?: A Lot Help needed to walk in hospital room?: A Lot Help needed climbing 3-5 steps with a railing? : Total 6 Click Score: 11    End of  Session Equipment Utilized During Treatment: Gait belt Activity Tolerance: Patient tolerated treatment well;No increased pain Patient left: in chair;with chair alarm set;with call bell/phone within reach Nurse Communication: Mobility status PT Visit Diagnosis: Other abnormalities of gait and mobility (R26.89);Unsteadiness on feet (R26.81) Hemiplegia - Right/Left: Left Hemiplegia - dominant/non-dominant: Non-dominant Hemiplegia - caused by: Cerebral infarction     Time: [id number] PT Time Calculation (min) (ACUTE ONLY): 16 min  Charges:  $Gait Training: 8-22 mins                     Coolidge Breeze, PT, DPT [facility] Department Office: [phone] Weekend pager: [phone]   Coolidge Breeze 12/29/2022, 3:39 PM

## 2022-12-30 ENCOUNTER — Encounter (HOSPITAL_COMMUNITY): Payer: Self-pay | Admitting: Internal Medicine

## 2022-12-30 DIAGNOSIS — I69391 Dysphagia following cerebral infarction: Secondary | ICD-10-CM | POA: Diagnosis not present

## 2022-12-30 LAB — CBC
HCT: 27.3 % — ABNORMAL LOW (ref 36.0–46.0)
Hemoglobin: 8.6 g/dL — ABNORMAL LOW (ref 12.0–15.0)
MCH: 29.8 pg (ref 26.0–34.0)
MCHC: 31.5 g/dL (ref 30.0–36.0)
MCV: 94.5 fL (ref 80.0–100.0)
Platelets: 278 10*3/uL (ref 150–400)
RBC: 2.89 MIL/uL — ABNORMAL LOW (ref 3.87–5.11)
RDW: 18.6 % — ABNORMAL HIGH (ref 11.5–15.5)
WBC: 9.2 10*3/uL (ref 4.0–10.5)
nRBC: 0 % (ref 0.0–0.2)

## 2022-12-30 LAB — MAGNESIUM: Magnesium: 2.3 mg/dL (ref 1.7–2.4)

## 2022-12-30 NOTE — Plan of Care (Signed)
Patient [name], VSS throughout shift. Slurred speech baseline. Diminished lungs, IS encouraged. All meds given on time as ordered via G-tube. Pt c/o pain relieved by PRN morphine.  Tfs on-going at goal, 58m/hr. Dry gauze dressing intact over G-tube. Pt had bowel and bladder incontinence o/n.  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 POC maintained, will continue to monitor.  Problem: Education: Goal: Knowledge of General Education information will improve Description: Including pain rating scale, medication(s)/side effects and non-pharmacologic comfort measures Outcome: Progressing   Problem: Health Behavior/Discharge Planning: Goal: Ability to manage health-related needs will improve Outcome: Progressing   Problem: Clinical Measurements: Goal: Ability to maintain clinical measurements within normal limits will improve Outcome: Progressing Goal: Will remain free from infection Outcome: Progressing Goal: Diagnostic test results will improve Outcome: Progressing Goal: Respiratory complications will improve Outcome: Progressing Goal: Cardiovascular complication will be avoided Outcome: Progressing   Problem: Activity: Goal: Risk for activity intolerance will decrease Outcome: Progressing   Problem: Nutrition: Goal: Adequate nutrition will be maintained Outcome: Progressing   Problem: Coping: Goal: Level of anxiety will decrease Outcome: Progressing   Problem: Elimination: Goal: Will not experience complications related to bowel motility Outcome: Progressing Goal: Will not experience complications related to urinary retention Outcome: Progressing   Problem: Pain Managment: Goal: General experience of comfort will improve Outcome: Progressing   Problem: Safety: Goal: Ability to remain free from injury will improve Outcome: Progressing   Problem: Skin Integrity: Goal: Risk for impaired skin integrity will decrease Outcome: Progressing

## 2022-12-30 NOTE — Progress Notes (Signed)
PROGRESS NOTE    AI BREAULT  [id number] DOB: Apr 04, 1956 DOA: 11/29/2022 PCP: Andree Moro, DO   Brief Narrative:  Patient is a 67 year old female with history of CVA, hypertension presented the emergency department at the request of her PCP for the evaluation of persistent dysphagia.  Found to have multifactorial dysphagia likely from stroke.  Status post G-tube placement by IR.  PT/OT recommending SNF on discharge.  Developed nausea, vomiting, abdominal pain on 1/26,  CT abdomen/pelvis showed possible cholecystitis, MRCP confirmed acute cholecystitis.  General surgery was  following.  Significant improvement abdominal pain, nausea, vomiting, liver enzymes,abx course completed.  Diet started and she is tolerating.  Prolonged hospitalization.  Her insurance declined SNF so daughter has appealed, decision is pending.  Hospital course remarkable for persistent diarrhea,anemia.  FOBT positive.  GI consulted, underwent EGD on 2/16 Assessment & Plan:   Dysphagia:  -Likely secondary to residual effect of the ischemic stroke.  Has history of esophageal stricture last dilated on 2021.  GI consulted. -  Barium swallow done on 11/30/2022 was nondiagnostic.  Underwent G-tube placement by IR on 1/22.Started on full liquid diet. -Underwent EGD on 2/16: Negative for any acute findings.  No specimens collected. -GI recommended to resume aspirin, Plavix.  Prolapsed hemorrhoids noted on rectal exam cause of heme +ve stool.  No plan for colonoscopy.  Continue Protonix 40 mg daily. -GI signed off.   Abdomen pain, nausea, vomiting/acute cholecystitis: Resolved.   -Likely acalculous cholecystitis.   -CT abdomen/pelvis, MRCP confirmed acute cholecystitis without stones.   -General surgery signed off.  Completed Augmentin course.  Diarrhea:  -thought to be from antibiotics and also tube feeding.  We requested nutritionist to change the tube diet.   -GI pathogen panel negative.  CT abdomen/pelvis with  contrast did not show any bowel inflammation or any other etiology to explain abdominal pain.  Tried to check C. Difficile but not approved by Dr Johnnye Sima.  Diarrhea has slowed down  -on  Lomotil and probiotics   Normocytic anemia: Hemoglobin gradually declined  to the range of 6.  Transfused with a unit of PRBC on 2/15,Hb in the range of 8 today.  FOBT checked and was positive.  Patient also mentioned that her diarrhea was black on 2/14.  Per GI- anemia is multifactorial.  External hemorrhoids noted on exam could be the cause of heme positive stool -EGD negative for any acute findings.  aspirin and Plavix resumed on 12/29/2022. -H&H is stable this morning  Severe protein calorie malnutrition: Nutrition was following.  BMI of 16.   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  On Plavix, aspirin, Lipitor, Zetia at home.  Also on baclofen and Zanaflex for left-sided hemiplegia and spasticity   Hypertension: Currently blood pressure soft/stable.  Not on any medications   Hypomagnesemia/hypokalemia: Replenished   Depression: On celexa   Debility/deconditioning: PT/OT recommending SNF on discharge.TOC following.  Insurance declined, family has appealed, pending placement.  Prolonged hospitalization  DVT prophylaxis:  SCD Code Status: Full code Family Communication:  None present at bedside.  Plan of care discussed with patient in length and she verbalized understanding and agreed with it. Disposition Plan: SNF  Consultants:  GI  Procedures:  EGD  Antimicrobials:  None  Status is: Inpatient    Subjective: Patient seen and examined.  Sleepy but arousable.  Reports that she feels better.  Tolerating tube feeding without any problems.  Denies blood in the stool.  No acute events  overnight.  Objective: Vitals:   12/29/22 0517 12/29/22 1409 12/29/22 2041 12/30/22 0521  BP: 101/72 (!) 102/53 (!) 102/49 102/62  Pulse: 87 77 97 84   Resp: 16 14 16 14  $ Temp: 98.1 F (36.7 C) 98.1 F (36.7 C) 99 F (37.2 C) 98.8 F (37.1 C)  TempSrc: Oral Oral Oral Oral  SpO2: 98% 95% 98% 100%  Weight:      Height:        Intake/Output Summary (Last 24 hours) at 12/30/2022 0926 Last data filed at 12/29/2022 2200 Gross per 24 hour  Intake 7618 ml  Output --  Net 7618 ml    Filed Weights   12/25/22 0518 12/28/22 0544 12/28/22 1131  Weight: 52.7 kg 45.9 kg 45.9 kg    Examination:  General exam: Appears calm and comfortable, on room air, appears deconditioned and weak Respiratory system: Clear to auscultation. Respiratory effort normal. Cardiovascular system: S1 & S2 heard, RRR. No JVD, murmurs, rubs, gallops or clicks. No pedal edema. Gastrointestinal system: Abdomen is nondistended, soft and nontender. No organomegaly or masses felt. Normal bowel sounds heard.  G-tube noted Central nervous system: Alert and oriented.  Left hemiplegia.  Contractures noted on left upper extremity.  Skin: No rashes, lesions or ulcers Psychiatry: Judgement and insight appear normal. Mood & affect appropriate.    Data Reviewed: I have personally reviewed following labs and imaging studies  CBC: Recent Labs  Lab 12/25/22 0607 12/27/22 0706 12/27/22 1445 12/28/22 0623 12/29/22 0559 12/30/22 0610  WBC 10.2 9.4  --  9.6 8.6 9.2  HGB 7.3* 6.3* 7.7* 8.7* 8.8* 8.6*  HCT 24.6* 20.2* 23.9* 27.4* 27.9* 27.3*  MCV 96.1 94.4  --  93.5 94.6 94.5  PLT 247 226  --  236 268 0000000    Basic Metabolic Panel: Recent Labs  Lab 12/25/22 0607 12/29/22 0559 12/30/22 0610  NA 139 137  --   K 3.7 3.9  --   CL 105 101  --   CO2 26 27  --   GLUCOSE 119* 102*  --   BUN 26* 21  --   CREATININE 0.58 0.67  --   CALCIUM 8.9 9.1  --   MG  --   --  2.3    GFR: Estimated Creatinine Clearance: 48.8 mL/min (by C-G formula based on SCr of 0.67 mg/dL). Liver Function Tests: No results for input(s): "AST", "ALT", "ALKPHOS", "BILITOT", "PROT", "ALBUMIN" in  the last 168 hours. No results for input(s): "LIPASE", "AMYLASE" in the last 168 hours. No results for input(s): "AMMONIA" in the last 168 hours. Coagulation Profile: No results for input(s): "INR", "PROTIME" in the last 168 hours. Cardiac Enzymes: No results for input(s): "CKTOTAL", "CKMB", "CKMBINDEX", "TROPONINI" in the last 168 hours. BNP (last 3 results) No results for input(s): "PROBNP" in the last 8760 hours. HbA1C: No results for input(s): "HGBA1C" in the last 72 hours. CBG: No results for input(s): "GLUCAP" in the last 168 hours. Lipid Profile: No results for input(s): "CHOL", "HDL", "LDLCALC", "TRIG", "CHOLHDL", "LDLDIRECT" in the last 72 hours. Thyroid Function Tests: No results for input(s): "TSH", "T4TOTAL", "FREET4", "T3FREE", "THYROIDAB" in the last 72 hours. Anemia Panel: No results for input(s): "VITAMINB12", "FOLATE", "FERRITIN", "TIBC", "IRON", "RETICCTPCT" in the last 72 hours. Sepsis Labs: No results for input(s): "PROCALCITON", "LATICACIDVEN" in the last 168 hours.  Recent Results (from the past 240 hour(s))  Gastrointestinal Panel by PCR , Stool     Status: None   Collection Time: 12/23/22 11:33 AM  Specimen: Stool  Result Value Ref Range Status   Campylobacter species NOT DETECTED NOT DETECTED Final   Plesimonas shigelloides NOT DETECTED NOT DETECTED Final   Salmonella species NOT DETECTED NOT DETECTED Final   Yersinia enterocolitica NOT DETECTED NOT DETECTED Final   Vibrio species NOT DETECTED NOT DETECTED Final   Vibrio cholerae NOT DETECTED NOT DETECTED Final   Enteroaggregative E coli (EAEC) NOT DETECTED NOT DETECTED Final   Enteropathogenic E coli (EPEC) NOT DETECTED NOT DETECTED Final   Enterotoxigenic E coli (ETEC) NOT DETECTED NOT DETECTED Final   Shiga like toxin producing E coli (STEC) NOT DETECTED NOT DETECTED Final   Shigella/Enteroinvasive E coli (EIEC) NOT DETECTED NOT DETECTED Final   Cryptosporidium NOT DETECTED NOT DETECTED Final    Cyclospora cayetanensis NOT DETECTED NOT DETECTED Final   Entamoeba histolytica NOT DETECTED NOT DETECTED Final   Giardia lamblia NOT DETECTED NOT DETECTED Final   Adenovirus F40/41 NOT DETECTED NOT DETECTED Final   Astrovirus NOT DETECTED NOT DETECTED Final   Norovirus GI/GII NOT DETECTED NOT DETECTED Final   Rotavirus A NOT DETECTED NOT DETECTED Final   Sapovirus (I, II, IV, and V) NOT DETECTED NOT DETECTED Final    Comment: Performed at [facility], [address] [place], [place] [address]      Radiology Studies: No results found.  Scheduled Meds:  aspirin EC  81 mg Oral Daily   atorvastatin  80 mg Per Tube QPC supper   citalopram  10 mg Oral q morning   clopidogrel  75 mg Oral Daily   diclofenac Sodium  4 g Topical QID   diphenoxylate-atropine  2 tablet Per Tube QID   ezetimibe  10 mg Per Tube Daily   feeding supplement (JEVITY 1.5 CAL/FIBER)  1,000 mL Per Tube Q24H   feeding supplement (PROSource TF20)  60 mL Per Tube TID BM   free water  100 mL Per Tube Q4H   melatonin  3 mg Per Tube QHS   multivitamin with minerals  1 tablet Per Tube Daily   pantoprazole  40 mg Oral QAC breakfast   saccharomyces boulardii  250 mg Per Tube BID   thiamine  100 mg Per Tube Daily   tiZANidine  2 mg Per Tube BID   Continuous Infusions:   LOS: 29 days   Time spent: 35 minutes   Amad Mau Loann Quill, MD [facility]  If 7PM-7AM, please contact night-coverage [email or web address] 12/30/2022, 9:26 AM

## 2022-12-31 DIAGNOSIS — I69391 Dysphagia following cerebral infarction: Secondary | ICD-10-CM | POA: Diagnosis not present

## 2022-12-31 LAB — CBC
HCT: 27 % — ABNORMAL LOW (ref 36.0–46.0)
Hemoglobin: 8.3 g/dL — ABNORMAL LOW (ref 12.0–15.0)
MCH: 29.3 pg (ref 26.0–34.0)
MCHC: 30.7 g/dL (ref 30.0–36.0)
MCV: 95.4 fL (ref 80.0–100.0)
Platelets: 289 10*3/uL (ref 150–400)
RBC: 2.83 MIL/uL — ABNORMAL LOW (ref 3.87–5.11)
RDW: 18.5 % — ABNORMAL HIGH (ref 11.5–15.5)
WBC: 8.9 10*3/uL (ref 4.0–10.5)
nRBC: 0 % (ref 0.0–0.2)

## 2022-12-31 NOTE — Progress Notes (Signed)
PROGRESS NOTE    Kara Bailey  [id number] DOB: Nov 14, 1955 DOA: 11/29/2022 PCP: Andree Moro, DO   Brief Narrative:  Patient is a 67 year old female with history of CVA, hypertension presented the emergency department at the request of her PCP for the evaluation of persistent dysphagia.  Found to have multifactorial dysphagia likely from stroke.  Status post G-tube placement by IR.  PT/OT recommending SNF on discharge.  Developed nausea, vomiting, abdominal pain on 1/26,  CT abdomen/pelvis showed possible cholecystitis, MRCP confirmed acute cholecystitis.  General surgery was  following.  Significant improvement abdominal pain, nausea, vomiting, liver enzymes,abx course completed.  Diet started and she is tolerating.  Prolonged hospitalization.  Her insurance declined SNF so daughter has appealed, decision is pending.  Hospital course remarkable for persistent diarrhea,anemia.  FOBT positive.  GI consulted, underwent EGD on 2/16 Assessment & Plan:   Dysphagia:  -Likely secondary to residual effect of the ischemic stroke.  Has history of esophageal stricture last dilated on 2021.  GI consulted. -  Barium swallow done on 11/30/2022 was nondiagnostic.   -Underwent G-tube placement by IR on 1/22.  -Underwent EGD on 2/16: Negative for any acute findings.  No specimens collected. -GI recommended to resume aspirin, Plavix.  Prolapsed hemorrhoids noted on rectal exam cause of heme +ve stool.  No plan for colonoscopy.  Continue Protonix 40 mg daily. -GI signed off.   Abdomen pain, nausea, vomiting/acute cholecystitis: Resolved.   -Likely acalculous cholecystitis.   -CT abdomen/pelvis, MRCP confirmed acute cholecystitis without stones.   -General surgery signed off.  Completed Augmentin course.  Diarrhea:  -thought to be from antibiotics and also tube feeding.  We requested nutritionist to change the tube diet.   -GI pathogen panel negative.  CT abdomen/pelvis with contrast did not show any  bowel inflammation or any other etiology to explain abdominal pain.  Tried to check C. Difficile but not approved by Dr Johnnye Sima.  Diarrhea has slowed down  -on  Lomotil and probiotics   Normocytic anemia: Hemoglobin gradually declined  to the range of 6.  Transfused with a unit of PRBC on 2/15 -FOBT checked and was positive.  Patient also mentioned that her diarrhea was black on 2/14.  Per GI- anemia is multifactorial.  External hemorrhoids noted on exam could be the cause of heme positive stool -EGD negative for any acute findings.  aspirin and Plavix resumed on 12/29/2022. -H&H is stable this morning  Severe protein calorie malnutrition: Nutrition was following.  BMI of 16.   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  On Plavix, aspirin, Lipitor, Zetia at home.  Also on baclofen and Zanaflex for left-sided hemiplegia and spasticity   Hypertension: Currently blood pressure soft/stable.  Not on any medications   Hypomagnesemia/hypokalemia: Replenished   Depression: On celexa   Debility/deconditioning: PT/OT recommending SNF on discharge.TOC following.  Insurance declined, family has appealed, pending placement.  Prolonged hospitalization  DVT prophylaxis:  SCD Code Status: Full code Family Communication:  None present at bedside.  Plan of care discussed with patient in length and she verbalized understanding and agreed with it. Disposition Plan: SNF  Consultants:  GI  Procedures:  EGD  Antimicrobials:  None  Status is: Inpatient    Subjective: Patient seen and examined.  Lying comfortably on the bed.  Denies any complaints.  Tolerating tube feeds without any issues.  Remained afebrile.  No acute event overnight.  Denies blood in stool.  Objective: Vitals:   12/30/22  [id number] 12/30/22 1307 12/30/22 2004 12/31/22 0351  BP: 102/62 (!) 95/57 (!) 103/55 104/80  Pulse: 84 78 85 80  Resp: 14 16 18 18  $ Temp: 98.8 F (37.1 C)  98.1 F (36.7 C) 98.9 F (37.2 C) 98.1 F (36.7 C)  TempSrc: Oral Oral Oral Oral  SpO2: 100% 98% 96% 95%  Weight:      Height:        Intake/Output Summary (Last 24 hours) at 12/31/2022 0958 Last data filed at 12/31/2022 0700 Gross per 24 hour  Intake --  Output 2 ml  Net -2 ml    Filed Weights   12/25/22 0518 12/28/22 0544 12/28/22 1131  Weight: 52.7 kg 45.9 kg 45.9 kg    Examination:  General exam: Appears calm and comfortable, on room air, appears deconditioned and weak Respiratory system: Clear to auscultation. Respiratory effort normal. Cardiovascular system: S1 & S2 heard, RRR. No JVD, murmurs, rubs, gallops or clicks. No pedal edema. Gastrointestinal system: Abdomen is nondistended, soft and nontender. No organomegaly or masses felt. Normal bowel sounds heard.  G-tube noted Central nervous system: Alert and oriented.  Left hemiplegia.  Contractures noted on left upper extremity.  Skin: No rashes, lesions or ulcers Psychiatry: Judgement and insight appear normal. Mood & affect appropriate.    Data Reviewed: I have personally reviewed following labs and imaging studies  CBC: Recent Labs  Lab 12/27/22 0706 12/27/22 1445 12/28/22 0623 12/29/22 0559 12/30/22 0610 12/31/22 0505  WBC 9.4  --  9.6 8.6 9.2 8.9  HGB 6.3* 7.7* 8.7* 8.8* 8.6* 8.3*  HCT 20.2* 23.9* 27.4* 27.9* 27.3* 27.0*  MCV 94.4  --  93.5 94.6 94.5 95.4  PLT 226  --  236 268 278 [id number]    Basic Metabolic Panel: Recent Labs  Lab 12/25/22 0607 12/29/22 0559 12/30/22 0610  NA 139 137  --   K 3.7 3.9  --   CL 105 101  --   CO2 26 27  --   GLUCOSE 119* 102*  --   BUN 26* 21  --   CREATININE 0.58 0.67  --   CALCIUM 8.9 9.1  --   MG  --   --  2.3    GFR: Estimated Creatinine Clearance: 48.8 mL/min (by C-G formula based on SCr of 0.67 mg/dL). Liver Function Tests: No results for input(s): "AST", "ALT", "ALKPHOS", "BILITOT", "PROT", "ALBUMIN" in the last 168 hours. No results for input(s):  "LIPASE", "AMYLASE" in the last 168 hours. No results for input(s): "AMMONIA" in the last 168 hours. Coagulation Profile: No results for input(s): "INR", "PROTIME" in the last 168 hours. Cardiac Enzymes: No results for input(s): "CKTOTAL", "CKMB", "CKMBINDEX", "TROPONINI" in the last 168 hours. BNP (last 3 results) No results for input(s): "PROBNP" in the last 8760 hours. HbA1C: No results for input(s): "HGBA1C" in the last 72 hours. CBG: No results for input(s): "GLUCAP" in the last 168 hours. Lipid Profile: No results for input(s): "CHOL", "HDL", "LDLCALC", "TRIG", "CHOLHDL", "LDLDIRECT" in the last 72 hours. Thyroid Function Tests: No results for input(s): "TSH", "T4TOTAL", "FREET4", "T3FREE", "THYROIDAB" in the last 72 hours. Anemia Panel: No results for input(s): "VITAMINB12", "FOLATE", "FERRITIN", "TIBC", "IRON", "RETICCTPCT" in the last 72 hours. Sepsis Labs: No results for input(s): "PROCALCITON", "LATICACIDVEN" in the last 168 hours.  Recent Results (from the past 240 hour(s))  Gastrointestinal Panel by PCR , Stool     Status: None   Collection Time: 12/23/22 11:33 AM   Specimen: Stool  Result Value Ref  Range Status   Campylobacter species NOT DETECTED NOT DETECTED Final   Plesimonas shigelloides NOT DETECTED NOT DETECTED Final   Salmonella species NOT DETECTED NOT DETECTED Final   Yersinia enterocolitica NOT DETECTED NOT DETECTED Final   Vibrio species NOT DETECTED NOT DETECTED Final   Vibrio cholerae NOT DETECTED NOT DETECTED Final   Enteroaggregative E coli (EAEC) NOT DETECTED NOT DETECTED Final   Enteropathogenic E coli (EPEC) NOT DETECTED NOT DETECTED Final   Enterotoxigenic E coli (ETEC) NOT DETECTED NOT DETECTED Final   Shiga like toxin producing E coli (STEC) NOT DETECTED NOT DETECTED Final   Shigella/Enteroinvasive E coli (EIEC) NOT DETECTED NOT DETECTED Final   Cryptosporidium NOT DETECTED NOT DETECTED Final   Cyclospora cayetanensis NOT DETECTED NOT DETECTED  Final   Entamoeba histolytica NOT DETECTED NOT DETECTED Final   Giardia lamblia NOT DETECTED NOT DETECTED Final   Adenovirus F40/41 NOT DETECTED NOT DETECTED Final   Astrovirus NOT DETECTED NOT DETECTED Final   Norovirus GI/GII NOT DETECTED NOT DETECTED Final   Rotavirus A NOT DETECTED NOT DETECTED Final   Sapovirus (I, II, IV, and V) NOT DETECTED NOT DETECTED Final    Comment: Performed at [facility], [address] [place], [place] [address]      Radiology Studies: No results found.  Scheduled Meds:  aspirin EC  81 mg Oral Daily   atorvastatin  80 mg Per Tube QPC supper   citalopram  10 mg Oral q morning   clopidogrel  75 mg Oral Daily   diclofenac Sodium  4 g Topical QID   diphenoxylate-atropine  2 tablet Per Tube QID   ezetimibe  10 mg Per Tube Daily   feeding supplement (JEVITY 1.5 CAL/FIBER)  1,000 mL Per Tube Q24H   feeding supplement (PROSource TF20)  60 mL Per Tube TID BM   free water  100 mL Per Tube Q4H   melatonin  3 mg Per Tube QHS   multivitamin with minerals  1 tablet Per Tube Daily   pantoprazole  40 mg Oral QAC breakfast   saccharomyces boulardii  250 mg Per Tube BID   thiamine  100 mg Per Tube Daily   tiZANidine  2 mg Per Tube BID   Continuous Infusions:   LOS: 30 days   Time spent: 35 minutes   Anaiza Behrens Loann Quill, MD [facility]  If 7PM-7AM, please contact night-coverage [email or web address] 12/31/2022, 9:58 AM

## 2022-12-31 NOTE — Progress Notes (Signed)
Occupational Therapy Treatment Patient Details Name: Kara Bailey MRN: [id number] DOB: 1956/06/06 Today's Date: 12/31/2022   History of present illness Patient is 67 year old female admitted 11/29/22 with multifactorial dysphagia; s/p PEG tube 12/03/22.  Pt with hx of CVA with L hemiparesis, HTN, iliac stent, hep C, GERD   OT comments  Patient was educated on importance of wearing splints daily to prevent further contracture. Patient verbalized understanding. Patient so far has been able to tolerate splint for 2 hours at time of this note. Patient's discharge plan remains appropriate at this time. OT will continue to follow acutely.     Recommendations for follow up therapy are one component of a multi-disciplinary discharge planning process, led by the attending physician.  Recommendations may be updated based on patient status, additional functional criteria and insurance authorization.    Follow Up Recommendations  Skilled nursing-short term rehab (<3 hours/day)     Assistance Recommended at Discharge Frequent or constant Supervision/Assistance  Patient can return home with the following  A lot of help with bathing/dressing/bathroom;Assistance with cooking/housework;Direct supervision/assist for medications management;Assist for transportation;Help with stairs or ramp for entrance;A lot of help with walking and/or transfers   Equipment Recommendations  None recommended by OT       Precautions / Restrictions Precautions Precautions: Fall Precaution Comments: hx of CVA last year with L sided weakness; L knee buckles Restrictions Weight Bearing Restrictions: No       Mobility Bed Mobility                    Transfers                         Balance                                           ADL either performed or assessed with clinical judgement   ADL Overall ADL's : Needs assistance/impaired                                        General ADL Comments: Patient declined to get out of bed today reporting that she does not feel well. patient was educated on importance of wearing splints daily to maintain ROM of L hand and prevent further contracture. patient verbalized understanding. patient participated in gentle ROM of hand and wrist, unable to tolerate elbow today. patient had splint donned by 12:40. patient was checked on again at 1:40 with patient reporting no pain. skin check completed with no redness or skin irritation. patient reported being wet at this time with min guard for rolling in bed with education on pulling up adult absorbent undergarments in bed. patient remains max a for this task with minimal engagement in progress in this area at this time. patient was checked on again at 2:40 with patient reporting no pain or irritation with skin check with no redness or skin irritation.    Extremity/Trunk Assessment              Vision       Perception     Praxis      Cognition Arousal/Alertness: Awake/alert Behavior During Therapy: WFL for tasks assessed/performed Overall Cognitive Status: Within Functional Limits for tasks assessed  Exercises      Shoulder Instructions       General Comments      Pertinent Vitals/ Pain       Pain Assessment Pain Assessment: Faces Faces Pain Scale: Hurts little more Pain Location: L shoulder with all movement Pain Descriptors / Indicators: Grimacing, Guarding Pain Intervention(s): Limited activity within patient's tolerance, Monitored during session  Home Living                                          Prior Functioning/Environment              Frequency  Min 2X/week        Progress Toward Goals  OT Goals(current goals can now be found in the care plan section)  Progress towards OT goals: Progressing toward goals     Plan Discharge plan remains  appropriate    Co-evaluation                 AM-PAC OT "6 Clicks" Daily Activity     Outcome Measure   Help from another person eating meals?: A Little Help from another person taking care of personal grooming?: A Little Help from another person toileting, which includes using toliet, bedpan, or urinal?: A Lot Help from another person bathing (including washing, rinsing, drying)?: A Lot Help from another person to put on and taking off regular upper body clothing?: A Lot Help from another person to put on and taking off regular lower body clothing?: A Lot 6 Click Score: 14    End of Session Equipment Utilized During Treatment: Other (comment) (resting hand splint)  OT Visit Diagnosis: Unsteadiness on feet (R26.81);Muscle weakness (generalized) (M62.81);Pain;Hemiplegia and hemiparesis Hemiplegia - Right/Left: Left Hemiplegia - dominant/non-dominant: Dominant Hemiplegia - caused by: Cerebral infarction Pain - Right/Left: Left Pain - part of body: Arm   Activity Tolerance Patient tolerated treatment well   Patient Left in bed;with call bell/phone within reach;with bed alarm set   Nurse Communication Other (comment) (need for new sacral dressing)        Time: 1340-1355 OT Time Calculation (min): 15 min  Charges: OT General Charges $OT Visit: 1 Visit OT Treatments $Self Care/Home Management : 8-22 mins  Rennie Plowman, MS Acute Rehabilitation Department Office# [phone]   Willa Rough 12/31/2022, 2:54 PM

## 2022-12-31 NOTE — TOC Progression Note (Signed)
Transition of Care [facility]) - Progression Note    Patient Details  Name: PRISILLA KESINGER MRN: [id number] Date of Birth: 30-Nov-1955  Transition of Care [facility]) CM/SW Contact  Joaquin Courts, RN Phone Number: 12/31/2022, 3:13 PM  Clinical Narrative:    CM received request from [facility] eligibility caseworker for a copy of FL2, this was sent viw secure email.  CM also spoke with [facility] and made admissions aware of pending LTC medicaid application.  Admissions rep reports need to complete asset check and will communicate with CM if patient can admit to facility under LOG with intent to remain for LTC.   Expected Discharge Plan: Skilled Nursing Facility Barriers to Discharge: SNF Authorization Denied  Expected Discharge Plan and Services In-house Referral: NA Discharge Planning Services: CM Consult Post Acute Care Choice: [place] Living arrangements for the past 2 months: Apartment Expected Discharge Date: 12/07/22               DME Arranged: N/A DME Agency: NA       HH Arranged: NA HH Agency: NA         Social Determinants of Health (SDOH) Interventions SDOH Screenings   Food Insecurity: Unknown (12/04/2022)  Housing: Low Risk  (12/04/2022)  Transportation Needs: Unknown (12/04/2022)  Utilities: Unknown (12/04/2022)  Depression (PHQ2-9): High Risk (09/19/2022)  Tobacco Use: High Risk (12/30/2022)    Readmission Risk Interventions    12/17/2022    1:42 PM  Readmission Risk Prevention Plan  Transportation Screening Complete  PCP or Specialist Appt within 3-5 Days Complete  HRI or [place] Complete  Social Work Consult for [place] Planning/Counseling [place] Not Applicable  Medication Review Press photographer) Referral to Pharmacy

## 2022-12-31 NOTE — Progress Notes (Signed)
Occupational Therapy Treatment Patient Details Name: Kara Bailey MRN: [id number] DOB: [birth year] Today's Date: 12/31/2022   History of present illness Patient is 67 year old female admitted 11/29/22 with multifactorial dysphagia; s/p PEG tube 12/03/22.  Pt with hx of CVA with L hemiparesis, HTN, iliac stent, hep C, GERD   OT comments  Patient was able to tolerate splint for a total of 3 hours today with patient reporting having to take pain medication for L wrist soreness at about 2.5 hours. Patient educated on importance of wearing splints and maintaining ROM  to prevent further contracture of LUE. Patient verbalized understanding but did demonstrate some self limiting behaviors during session. Patient's discharge plan remains appropriate at this time. OT will continue to follow acutely.     Recommendations for follow up therapy are one component of a multi-disciplinary discharge planning process, led by the attending physician.  Recommendations may be updated based on patient status, additional functional criteria and insurance authorization.    Follow Up Recommendations  Skilled nursing-short term rehab (<3 hours/day)     Assistance Recommended at Discharge Frequent or constant Supervision/Assistance  Patient can return home with the following  A lot of help with bathing/dressing/bathroom;Assistance with cooking/housework;Direct supervision/assist for medications management;Assist for transportation;Help with stairs or ramp for entrance;A lot of help with walking and/or transfers   Equipment Recommendations  None recommended by OT    Recommendations for Other Services      Precautions / Restrictions Precautions Precautions: Fall Precaution Comments: hx of CVA last year with L sided weakness; L knee buckles Restrictions Weight Bearing Restrictions: No       Mobility Bed Mobility                    Transfers                         Balance                                            ADL either performed or assessed with clinical judgement   ADL Overall ADL's : Needs assistance/impaired                                       General ADL Comments: Patient was able to tolerate resting hand splint for a total of 3 hours today with increasd pain reported in L wrist. patient was educated on importance of wearing resting hand and palm guard each day. patient declined to wear resting hand reporting" i can do it tomorrow" patient was re educated on importance of preventing further contractures in LUE. patient verbalized understanding but continued to decline to wear splint. patient declined to get out of bed and engage in ROM of elbow and shoulder reporting " i dont feel like it today". eduction provided as noted above.    Extremity/Trunk Assessment              Vision       Perception     Praxis      Cognition Arousal/Alertness: Awake/alert Behavior During Therapy: WFL for tasks assessed/performed Overall Cognitive Status: Within Functional Limits for tasks assessed  Exercises      Shoulder Instructions       General Comments      Pertinent Vitals/ Pain       Pain Assessment Pain Assessment: Faces Faces Pain Scale: Hurts little more Pain Location: L wrist Pain Descriptors / Indicators: Grimacing, Guarding Pain Intervention(s): Limited activity within patient's tolerance, Monitored during session  Home Living                                          Prior Functioning/Environment              Frequency  Min 2X/week        Progress Toward Goals  OT Goals(current goals can now be found in the care plan section)  Progress towards OT goals: Progressing toward goals     Plan Discharge plan remains appropriate    Co-evaluation                 AM-PAC OT "6 Clicks" Daily Activity     Outcome  Measure   Help from another person eating meals?: A Little Help from another person taking care of personal grooming?: A Little Help from another person toileting, which includes using toliet, bedpan, or urinal?: A Lot Help from another person bathing (including washing, rinsing, drying)?: A Lot Help from another person to put on and taking off regular upper body clothing?: A Lot Help from another person to put on and taking off regular lower body clothing?: A Lot 6 Click Score: 14    End of Session Equipment Utilized During Treatment: Other (comment) (resting hand splint)  OT Visit Diagnosis: Unsteadiness on feet (R26.81);Muscle weakness (generalized) (M62.81);Pain;Hemiplegia and hemiparesis Hemiplegia - Right/Left: Left Hemiplegia - dominant/non-dominant: Dominant Hemiplegia - caused by: Cerebral infarction Pain - Right/Left: Left Pain - part of body: Arm   Activity Tolerance Patient tolerated treatment well   Patient Left in bed;with call bell/phone within reach;with bed alarm set   Nurse Communication Other (comment) (need for new sacral dressing)        Time: [id number] OT Time Calculation (min): 10 min  Charges: OT General Charges $OT Visit: 1 Visit OT Treatments $Self Care/Home Management : 8-22 mins $Therapeutic Activity: 8-22 mins  Rennie Plowman, MS Acute Rehabilitation Department Office# [phone]   Willa Rough 12/31/2022, 4:10 PM

## 2023-01-01 DIAGNOSIS — I69391 Dysphagia following cerebral infarction: Secondary | ICD-10-CM | POA: Diagnosis not present

## 2023-01-01 LAB — BASIC METABOLIC PANEL
Anion gap: 9 (ref 5–15)
BUN: 29 mg/dL — ABNORMAL HIGH (ref 8–23)
CO2: 26 mmol/L (ref 22–32)
Calcium: 9 mg/dL (ref 8.9–10.3)
Chloride: 103 mmol/L (ref 98–111)
Creatinine, Ser: 0.68 mg/dL (ref 0.44–1.00)
GFR, Estimated: >60 mL/min (ref >60)
Glucose, Bld: 115 mg/dL — ABNORMAL HIGH (ref 70–99)
Potassium: 4.1 mmol/L (ref 3.5–5.1)
Sodium: 138 mmol/L (ref 135–145)

## 2023-01-01 LAB — CBC
HCT: 27.7 % — ABNORMAL LOW (ref 36.0–46.0)
Hemoglobin: 8.6 g/dL — ABNORMAL LOW (ref 12.0–15.0)
MCH: 29.3 pg (ref 26.0–34.0)
MCHC: 31 g/dL (ref 30.0–36.0)
MCV: 94.2 fL (ref 80.0–100.0)
Platelets: 316 10*3/uL (ref 150–400)
RBC: 2.94 MIL/uL — ABNORMAL LOW (ref 3.87–5.11)
RDW: 18 % — ABNORMAL HIGH (ref 11.5–15.5)
WBC: 9.7 10*3/uL (ref 4.0–10.5)
nRBC: 0 % (ref 0.0–0.2)

## 2023-01-01 LAB — MAGNESIUM: Magnesium: 2.4 mg/dL (ref 1.7–2.4)

## 2023-01-01 NOTE — Progress Notes (Signed)
PT Cancellation Note  Patient Details Name: Kara Bailey MRN: [id number] DOB: [birth year]   Cancelled Treatment:    Reason Eval/Treat Not Completed: Patient declined, no reason specified refused therapy as she had not gotten any sleep last night. Agreeable to PT returning early tomorrow morning and promised she will work with [place] at that time.   Deniece Ree PT DPT PN2

## 2023-01-01 NOTE — TOC Progression Note (Addendum)
Transition of Care [facility]) - Progression Note    Patient Details  Name: Kara Bailey MRN: [id number] Date of Birth: [birth year]  Transition of Care [facility]) CM/SW [place], RN Phone [phone]  01/01/2023, 10:52 AM  Clinical Narrative:    CM has informed daughter that [facility] has rescinded bed offer. CM has advised daughter that [facility] supervisor is attempting to reach out to [place] for possible bed offer. CM has made daughter aware that [facility] may need to expand bed search to [facility] and [facility]. TOC will continue to follow.   1150 CM spoke with daughter [name] who gives verbal consent to search [place] and [facility] for bed offers. Daughter states she just doesn't want it to be far away.    Expected Discharge Plan: Skilled Nursing Facility Barriers to Discharge: SNF Authorization Denied  Expected Discharge Plan and Services In-house Referral: NA Discharge Planning Services: CM Consult Post Acute Care Choice: [place] Living arrangements for the past 2 months: Apartment Expected Discharge Date: 12/07/22               DME Arranged: N/A DME Agency: NA       HH Arranged: NA HH Agency: NA         Social Determinants of Health (SDOH) Interventions SDOH Screenings   Food Insecurity: Unknown (12/04/2022)  Housing: Low Risk  (12/04/2022)  Transportation Needs: Unknown (12/04/2022)  Utilities: Unknown (12/04/2022)  Depression (PHQ2-9): High Risk (09/19/2022)  Tobacco Use: High Risk (12/30/2022)    Readmission Risk Interventions    12/17/2022    1:42 PM  Readmission Risk Prevention Plan  Transportation Screening Complete  PCP or Specialist Appt within 3-5 Days Complete  HRI or [place] Complete  Social Work Consult for [place] Planning/Counseling [place] Not Applicable  Medication Review Press photographer) Referral to Pharmacy

## 2023-01-01 NOTE — Progress Notes (Signed)
Chaplain continues to follow-up with Lorriane Shire and offer support.  Gracilyn voiced that she was happy to have the companion sitter come sit with her the other day.  She stated that they played games.  Gaye was feeling tired this morning because she didn't sleep last night.  Chaplain provided space for her to rest and will follow-up later today.     01/01/23 1000  Spiritual Encounters  Type of Visit Follow up  Care provided to: Patient

## 2023-01-01 NOTE — TOC Progression Note (Signed)
Transition of Care [facility]) - Progression Note    Patient Details  Name: Kara Bailey MRN: [id number] Date of Birth: [birth year]  Transition of Care [facility]) CM/SW Contact  Joaquin Courts, RN Phone Number: 01/01/2023, 9:30 AM  Clinical Narrative:    CM received call from [facility] admissions rep, who reports that case has been staffed with facility administrator who has determined that at this time facility is unable to accept patient, previous bed offer has been rescinded.    Expected Discharge Plan: Skilled Nursing Facility Barriers to Discharge: SNF Authorization Denied  Expected Discharge Plan and Services In-house Referral: NA Discharge Planning Services: CM Consult Post Acute Care Choice: [place] Living arrangements for the past 2 months: Apartment Expected Discharge Date: 12/07/22               DME Arranged: N/A DME Agency: NA       HH Arranged: NA HH Agency: NA         Social Determinants of Health (SDOH) Interventions SDOH Screenings   Food Insecurity: Unknown (12/04/2022)  Housing: Low Risk  (12/04/2022)  Transportation Needs: Unknown (12/04/2022)  Utilities: Unknown (12/04/2022)  Depression (PHQ2-9): High Risk (09/19/2022)  Tobacco Use: High Risk (12/30/2022)    Readmission Risk Interventions    12/17/2022    1:42 PM  Readmission Risk Prevention Plan  Transportation Screening Complete  PCP or Specialist Appt within 3-5 Days Complete  HRI or [place] Complete  Social Work Consult for [place] Planning/Counseling [place] Not Applicable  Medication Review Press photographer) Referral to Pharmacy

## 2023-01-01 NOTE — Progress Notes (Signed)
PROGRESS NOTE    Kara Bailey  [id number] DOB: 1956-04-25 DOA: 11/29/2022 PCP: Andree Moro, DO   Brief Narrative:  Patient is a 67 year old female with history of CVA, hypertension presented the emergency department at the request of her PCP for the evaluation of persistent dysphagia.  Found to have multifactorial dysphagia likely from stroke.  Status post G-tube placement by IR.  PT/OT recommending SNF on discharge.  Developed nausea, vomiting, abdominal pain on 1/26,  CT abdomen/pelvis showed possible cholecystitis, MRCP confirmed acute cholecystitis.  General surgery was  following.  Significant improvement abdominal pain, nausea, vomiting, liver enzymes,abx course completed.  Diet started and she is tolerating.  Prolonged hospitalization.  Her insurance declined SNF so daughter has appealed, decision is pending.  Hospital course remarkable for persistent diarrhea,anemia.  FOBT positive.  GI consulted, underwent EGD on 2/16.  01/01/2023: The patient was seen and examined at bedside.  She has no new complaints.  Status post G-tube placement by IR on 12/03/2022.Marland Kitchen  Awaiting placement for SNF.  Insurance denied however daughter has appealed.  Assessment & Plan:   Dysphagia:  -Likely secondary to residual effect of the ischemic stroke.  Has history of esophageal stricture last dilated on 2021.  GI consulted. -  Barium swallow done on 11/30/2022 was nondiagnostic.   -Underwent G-tube placement by IR on 1/22.  -Underwent EGD on 2/16: Negative for any acute findings.  No specimens collected. -GI recommended to resume aspirin, Plavix.  Prolapsed hemorrhoids noted on rectal exam cause of heme +ve stool.  No plan for colonoscopy.  Continue Protonix 40 mg daily. -GI signed off.   Abdomen pain, nausea, vomiting/acute cholecystitis: Resolved.   -Likely acalculous cholecystitis.   -CT abdomen/pelvis, MRCP confirmed acute cholecystitis without stones.   -General surgery signed off.  Completed  Augmentin course.  Diarrhea:  -thought to be from antibiotics and also tube feeding.  We requested nutritionist to change the tube diet.   -GI pathogen panel negative.  CT abdomen/pelvis with contrast did not show any bowel inflammation or any other etiology to explain abdominal pain.  Tried to check C. Difficile but not approved by Dr Johnnye Sima.  Diarrhea has slowed down  -on  Lomotil and probiotics   Normocytic anemia: Hemoglobin gradually declined  to the range of 6.  Transfused with a unit of PRBC on 2/15 -FOBT checked and was positive.  Patient also mentioned that her diarrhea was black on 2/14.  Per GI- anemia is multifactorial.  External hemorrhoids noted on exam could be the cause of heme positive stool -EGD negative for any acute findings.  aspirin and Plavix resumed on 12/29/2022. -H&H is stable this morning  Severe protein calorie malnutrition: Nutrition was following.  BMI of 16.   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  On Plavix, aspirin, Lipitor, Zetia at home.  Also on baclofen and Zanaflex for left-sided hemiplegia and spasticity   Hypertension: Currently blood pressure soft/stable.  Not on any medications   Hypomagnesemia/hypokalemia: Replenished   Depression: On celexa   Debility/deconditioning: PT/OT recommending SNF on discharge.TOC following.  Insurance declined, family has appealed, pending placement.  Prolonged hospitalization  DVT prophylaxis:  SCD Code Status: Full code Family Communication:  None present at bedside.  Plan of care discussed with patient in length and she verbalized understanding and agreed with it. Disposition Plan: SNF  Consultants:  GI  Procedures:  EGD  Antimicrobials:  None  Status is: Inpatient    Objective: Vitals:  12/31/22 0351 12/31/22 2138 01/01/23 0559 01/01/23 1341  BP: 104/80 136/71 (!) 115/59 123/68  Pulse: 80 87 81 90  Resp: 18 14 14 18  $ Temp: 98.1 F  (36.7 C) 98.3 F (36.8 C) 98.4 F (36.9 C) 97.9 F (36.6 C)  TempSrc: Oral Oral Oral Oral  SpO2: 95% 98% 100% 100%  Weight:   52.5 kg   Height:        Intake/Output Summary (Last 24 hours) at 01/01/2023 1842 Last data filed at 01/01/2023 1819 Gross per 24 hour  Intake 3042 ml  Output 250 ml  Net 2792 ml   Filed Weights   12/28/22 0544 12/28/22 1131 01/01/23 0559  Weight: 45.9 kg 45.9 kg 52.5 kg    Examination: No significant changes from prior exam.  General exam: Appears calm and comfortable, on room air, appears deconditioned and weak Respiratory system: Clear to auscultation. Respiratory effort normal. Cardiovascular system: S1 & S2 heard, RRR. No JVD, murmurs, rubs, gallops or clicks. No pedal edema. Gastrointestinal system: Abdomen is nondistended, soft and nontender. No organomegaly or masses felt. Normal bowel sounds heard.  G-tube noted Central nervous system: Alert and oriented.  Left hemiplegia.  Contractures noted on left upper extremity.  Skin: No rashes, lesions or ulcers Psychiatry: Judgement and insight appear normal. Mood & affect appropriate.    Data Reviewed: I have personally reviewed following labs and imaging studies  CBC: Recent Labs  Lab 12/28/22 0623 12/29/22 0559 12/30/22 0610 12/31/22 0505 01/01/23 0532  WBC 9.6 8.6 9.2 8.9 9.7  HGB 8.7* 8.8* 8.6* 8.3* 8.6*  HCT 27.4* 27.9* 27.3* 27.0* 27.7*  MCV 93.5 94.6 94.5 95.4 94.2  PLT 236 268 278 289 [id number]   Basic Metabolic Panel: Recent Labs  Lab 12/29/22 0559 12/30/22 0610 01/01/23 0532  NA 137  --  138  K 3.9  --  4.1  CL 101  --  103  CO2 27  --  26  GLUCOSE 102*  --  115*  BUN 21  --  29*  CREATININE 0.67  --  0.68  CALCIUM 9.1  --  9.0  MG  --  2.3 2.4   GFR: Estimated Creatinine Clearance: 55.8 mL/min (by C-G formula based on SCr of 0.68 mg/dL). Liver Function Tests: No results for input(s): "AST", "ALT", "ALKPHOS", "BILITOT", "PROT", "ALBUMIN" in the last 168 hours. No results  for input(s): "LIPASE", "AMYLASE" in the last 168 hours. No results for input(s): "AMMONIA" in the last 168 hours. Coagulation Profile: No results for input(s): "INR", "PROTIME" in the last 168 hours. Cardiac Enzymes: No results for input(s): "CKTOTAL", "CKMB", "CKMBINDEX", "TROPONINI" in the last 168 hours. BNP (last 3 results) No results for input(s): "PROBNP" in the last 8760 hours. HbA1C: No results for input(s): "HGBA1C" in the last 72 hours. CBG: No results for input(s): "GLUCAP" in the last 168 hours. Lipid Profile: No results for input(s): "CHOL", "HDL", "LDLCALC", "TRIG", "CHOLHDL", "LDLDIRECT" in the last 72 hours. Thyroid Function Tests: No results for input(s): "TSH", "T4TOTAL", "FREET4", "T3FREE", "THYROIDAB" in the last 72 hours. Anemia Panel: No results for input(s): "VITAMINB12", "FOLATE", "FERRITIN", "TIBC", "IRON", "RETICCTPCT" in the last 72 hours. Sepsis Labs: No results for input(s): "PROCALCITON", "LATICACIDVEN" in the last 168 hours.  Recent Results (from the past 240 hour(s))  Gastrointestinal Panel by PCR , Stool     Status: None   Collection Time: 12/23/22 11:33 AM   Specimen: Stool  Result Value Ref Range Status   Campylobacter species NOT DETECTED NOT DETECTED  Final   Plesimonas shigelloides NOT DETECTED NOT DETECTED Final   Salmonella species NOT DETECTED NOT DETECTED Final   Yersinia enterocolitica NOT DETECTED NOT DETECTED Final   Vibrio species NOT DETECTED NOT DETECTED Final   Vibrio cholerae NOT DETECTED NOT DETECTED Final   Enteroaggregative E coli (EAEC) NOT DETECTED NOT DETECTED Final   Enteropathogenic E coli (EPEC) NOT DETECTED NOT DETECTED Final   Enterotoxigenic E coli (ETEC) NOT DETECTED NOT DETECTED Final   Shiga like toxin producing E coli (STEC) NOT DETECTED NOT DETECTED Final   Shigella/Enteroinvasive E coli (EIEC) NOT DETECTED NOT DETECTED Final   Cryptosporidium NOT DETECTED NOT DETECTED Final   Cyclospora cayetanensis NOT DETECTED  NOT DETECTED Final   Entamoeba histolytica NOT DETECTED NOT DETECTED Final   Giardia lamblia NOT DETECTED NOT DETECTED Final   Adenovirus F40/41 NOT DETECTED NOT DETECTED Final   Astrovirus NOT DETECTED NOT DETECTED Final   Norovirus GI/GII NOT DETECTED NOT DETECTED Final   Rotavirus A NOT DETECTED NOT DETECTED Final   Sapovirus (I, II, IV, and V) NOT DETECTED NOT DETECTED Final    Comment: Performed at [facility], [address] [place], [place] [address]      Radiology Studies: No results found.  Scheduled Meds:  aspirin EC  81 mg Oral Daily   atorvastatin  80 mg Per Tube QPC supper   citalopram  10 mg Oral q morning   clopidogrel  75 mg Oral Daily   diclofenac Sodium  4 g Topical QID   diphenoxylate-atropine  2 tablet Per Tube QID   ezetimibe  10 mg Per Tube Daily   feeding supplement (JEVITY 1.5 CAL/FIBER)  1,000 mL Per Tube Q24H   feeding supplement (PROSource TF20)  60 mL Per Tube TID BM   free water  100 mL Per Tube Q4H   melatonin  3 mg Per Tube QHS   multivitamin with minerals  1 tablet Per Tube Daily   pantoprazole  40 mg Oral QAC breakfast   saccharomyces boulardii  250 mg Per Tube BID   thiamine  100 mg Per Tube Daily   tiZANidine  2 mg Per Tube BID   Continuous Infusions:   LOS: 31 days   Time spent: 35 minutes   Kayleen Memos, MD [facility]  If 7PM-7AM, please contact night-coverage [email or web address] 01/01/2023, 6:42 PM

## 2023-01-02 ENCOUNTER — Other Ambulatory Visit: Payer: Self-pay

## 2023-01-02 DIAGNOSIS — I69391 Dysphagia following cerebral infarction: Secondary | ICD-10-CM | POA: Diagnosis not present

## 2023-01-02 MED ORDER — DIPHENHYDRAMINE HCL 25 MG PO CAPS
25.0000 mg | ORAL_CAPSULE | ORAL | Status: AC | PRN
  Administered 2023-01-02: 25 mg via ORAL
  Filled 2023-01-02: qty 1

## 2023-01-02 MED ORDER — MORPHINE SULFATE 10 MG/5ML PO SOLN
2.5000 mg | ORAL | Status: DC | PRN
  Administered 2023-01-02 – 2023-01-14 (×31): 2.5 mg
  Filled 2023-01-02 (×10): qty 5
  Filled 2023-01-02: qty 1.3
  Filled 2023-01-02: qty 2
  Filled 2023-01-02 (×11): qty 5
  Filled 2023-01-02: qty 2
  Filled 2023-01-02 (×3): qty 5
  Filled 2023-01-02: qty 2
  Filled 2023-01-02 (×5): qty 5

## 2023-01-02 MED ORDER — MORPHINE SULFATE (CONCENTRATE) 10 MG/0.5ML PO SOLN
2.5000 mg | ORAL | Status: DC | PRN
  Administered 2023-01-02: 2.6 mg
  Filled 2023-01-02: qty 0.5

## 2023-01-02 NOTE — Progress Notes (Addendum)
Physical Therapy Treatment Patient Details Name: Kara Bailey MRN: [id number] DOB: 1956/04/11 Today's Date: 01/02/2023   History of Present Illness Patient is 67 year old female admitted 11/29/22 with multifactorial dysphagia; s/p PEG tube 12/03/22; EGD 2/16.  Pt with hx of CVA with L hemiparesis, HTN, iliac stent, hep C, GERD    PT Comments    Pt needing mod A to upright trunk into sitting EOB, pt pulling with RUE on therapist's hand to pull self up. Pt powers to stand with therapist positioned anterior to pt, blocking LLE, pt noted to have BM so returned to sitting. Able to power to stand 2nd time with nurse in room providing pericare/donning personal brief, mod A again with therapist blocking L knee. Pt pivots to recliner at bedside with increased time, cues for R hand placement and body positioning with transfer. Pt frustrated with frequent BM, especially with exertion; amb not attempted this session. RN in room at EOS to discuss medication.    Recommendations for follow up therapy are one component of a multi-disciplinary discharge planning process, led by the attending physician.  Recommendations may be updated based on patient status, additional functional criteria and insurance authorization.  Follow Up Recommendations  Skilled nursing-short term rehab (<3 hours/day) Can patient physically be transported by private vehicle: No   Assistance Recommended at Discharge Frequent or constant Supervision/Assistance  Patient can return home with the following A little help with bathing/dressing/bathroom;Assistance with cooking/housework;Help with stairs or ramp for entrance;A lot of help with walking and/or transfers;Assist for transportation   Equipment Recommendations  None recommended by PT    Recommendations for Other Services       Precautions / Restrictions Precautions Precautions: Fall Precaution Comments: hx of CVA last year with L sided weakness; L knee  buckles Restrictions Weight Bearing Restrictions: No     Mobility  Bed Mobility Overal bed mobility: Needs Assistance Bed Mobility: Supine to Sit  Supine to sit: Mod assist, HOB elevated  General bed mobility comments: mod A to upright trunk into sitting with HOB elevated    Transfers Overall transfer level: Needs assistance Equipment used: 1 person hand held assist Transfers: Sit to/from Stand, Bed to chair/wheelchair/BSC Sit to Stand: Mod assist Stand pivot transfers: Mod assist  General transfer comment: mod A to power up to stand x2 reps from EOB, nursing present to provide pericare/don brief, mod A to pivot to recliner at bedside, therapist positioned anterior to pt (pt declines hemiwalker), cues for hand placement and pivoting, therapist blocking LLE from buckling    Ambulation/Gait      Stairs             Wheelchair Mobility    Modified Rankin (Stroke Patients Only)       Balance Overall balance assessment: Needs assistance Sitting-balance support: No upper extremity supported, Feet supported Sitting balance-Leahy Scale: Good Sitting balance - Comments: static sitting   Standing balance support: Single extremity supported Standing balance-Leahy Scale: Poor Standing balance comment: RUE support and assist from therapist     Cognition Arousal/Alertness: Awake/alert Behavior During Therapy: WFL for tasks assessed/performed Overall Cognitive Status: Within Functional Limits for tasks assessed  General Comments: pt pleasant, wanting to get OOB        Exercises      General Comments        Pertinent Vitals/Pain Pain Assessment Pain Assessment: No/denies pain    Home Living  Prior Function            PT Goals (current goals can now be found in the care plan section) Acute Rehab PT Goals Patient Stated Goal: go to rehab PT Goal Formulation: With patient Time For Goal Achievement: 01/12/23 Potential to  Achieve Goals: Good    Frequency    Min 2X/week      PT Plan Current plan remains appropriate    Co-evaluation              AM-PAC PT "6 Clicks" Mobility   Outcome Measure  Help needed turning from your back to your side while in a flat bed without using bedrails?: A Lot Help needed moving from lying on your back to sitting on the side of a flat bed without using bedrails?: A Lot Help needed moving to and from a bed to a chair (including a wheelchair)?: A Lot Help needed standing up from a chair using your arms (e.g., wheelchair or bedside chair)?: A Lot Help needed to walk in hospital room?: Total Help needed climbing 3-5 steps with a railing? : Total 6 Click Score: 10    End of Session Equipment Utilized During Treatment: Gait belt Activity Tolerance: Patient tolerated treatment well Patient left: in chair;with chair alarm set;with call bell/phone within reach;with nursing/sitter in room Nurse Communication: Mobility status PT Visit Diagnosis: Other abnormalities of gait and mobility (R26.89);Unsteadiness on feet (R26.81) Hemiplegia - Right/Left: Left Hemiplegia - dominant/non-dominant: Non-dominant Hemiplegia - caused by: Cerebral infarction     Time: 1005-1026 PT Time Calculation (min) (ACUTE ONLY): 21 min  Charges:  $Therapeutic Activity: 8-22 mins                      Tori Akito Boomhower PT, DPT 01/02/23, 11:31 AM

## 2023-01-02 NOTE — Progress Notes (Signed)
PROGRESS NOTE    Kara Bailey  [id number] DOB: 28-Dec-1955 DOA: 11/29/2022 PCP: Andree Moro, DO     Brief Narrative:  Kara Bailey is a 67 year old female with history of CVA, hypertension presented the emergency department at the request of her PCP for the evaluation of persistent dysphagia.  Found to have multifactorial dysphagia likely from stroke.  Status post G-tube placement by IR.  PT/OT recommending SNF on discharge.  Developed nausea, vomiting, abdominal pain on 1/26,  CT abdomen/pelvis showed possible cholecystitis, MRCP confirmed acute cholecystitis.  General surgery was  following.  Significant improvement abdominal pain, nausea, vomiting, liver enzymes.  She completed antibiotic course.  Hospital course remarkable for persistent diarrhea, anemia.  FOBT positive.  GI consulted, underwent EGD on 2/16.  She has had prolonged hospitalization.  Her insurance declined SNF so daughter has appealed, decision is pending.    New events last 24 hours / Subjective: No new issues overnight. Change IV morphine to per tube  Assessment & Plan:  Principal Problem:   Dysphagia as late effect of cerebrovascular accident (CVA) Active Problems:   Essential hypertension   Left hemiplegia (HCC)   History of stroke   Dysphagia   Protein-calorie malnutrition, severe   Decreased hemoglobin   Heme positive stool   Dysphagia  -Likely secondary to residual effect of the ischemic stroke.  Has history of esophageal stricture last dilated on 2021.  GI consulted. Barium swallow done on 11/30/2022 was nondiagnostic.   -Underwent G-tube placement by IR on 1/22    Abdomen pain, nausea, vomiting/acute cholecystitis   -Likely acalculous cholecystitis.  -CT abdomen/pelvis, MRCP confirmed acute cholecystitis without stones.   -Completed Augmentin course. -General surgery signed off.  Diarrhea  -Thought to be from antibiotics and also tube feeding.  We requested nutritionist to change the tube  diet.   -GI pathogen panel negative.  CT abdomen/pelvis with contrast did not show any bowel inflammation or any other etiology to explain abdominal pain.  Tried to check C. Difficile but not approved by Dr Johnnye Sima.  Diarrhea has slowed down  -On Lomotil and probiotics   Normocytic anemia -Transfused with a unit of PRBC on 2/15 -FOBT checked and was positive.  Patient also mentioned that her diarrhea was black on 2/14.  Per GI- anemia is multifactorial.  External hemorrhoids noted on exam could be the cause of heme positive stool -EGD negative for any acute findings. Aspirin and Plavix resumed on 12/29/2022. -Protonix    History of ischemic stroke with dysarthria/dysphagia -Has a dense hemiplegia since September 2023.  On Plavix, aspirin, Lipitor, Zetia at home.  Also on baclofen and Zanaflex for left-sided hemiplegia and spasticity   Depression -Celexa   Debility/deconditioning -PT/OT recommending SNF on discharge. TOC following.  Insurance declined, family has appealed, pending placement.  Prolonged hospitalization   In agreement with assessment of the pressure ulcer as below:  Pressure Injury 12/29/22 Sacrum Mid Stage 2 -  Partial thickness loss of dermis presenting as a shallow open injury with a red, pink wound bed without slough. (Active)  12/29/22 0830  Location: Sacrum  Location Orientation: Mid  Staging: Stage 2 -  Partial thickness loss of dermis presenting as a shallow open injury with a red, pink wound bed without slough.  Wound Description (Comments):   Present on Admission:   Dressing Type Foam - Lift dressing to assess site every shift 01/01/23 1926     Nutrition Problem: Severe Malnutrition Etiology: chronic illness, dysphagia (h/o CVA)   DVT  prophylaxis:  SCDs Start: 11/30/22 0151  Code Status: Full Family Communication: None at bedside  Disposition Plan:  Status is: Inpatient Remains inpatient appropriate because: awaiting SNF placement   Antimicrobials:   Anti-infectives (From admission, onward)    Start     Dose/Rate Route Frequency Ordered Stop   12/11/22 2200  amoxicillin-clavulanate (AUGMENTIN) 400-57 MG/5ML suspension 875 mg        875 mg Per Tube Every 12 hours 12/11/22 1137 12/18/22 1039   12/11/22 1000  amoxicillin-clavulanate (AUGMENTIN) 875-125 MG per tablet 1 tablet  Status:  Discontinued        1 tablet Oral Every 12 hours 12/11/22 0821 12/11/22 1137   12/08/22 2000  piperacillin-tazobactam (ZOSYN) IVPB 3.375 g  Status:  Discontinued        3.375 g 12.5 mL/hr over 240 Minutes Intravenous Every 8 hours 12/08/22 1900 12/11/22 0821   12/07/22 1130  cefadroxil (DURICEF) capsule 500 mg  Status:  Discontinued        500 mg Oral 2 times daily 12/07/22 1033 12/07/22 1033   12/07/22 1130  cefadroxil (DURICEF) capsule 500 mg  Status:  Discontinued        500 mg Oral 2 times daily 12/07/22 1033 12/07/22 1034   12/07/22 1130  cefadroxil (DURICEF) capsule 500 mg  Status:  Discontinued        500 mg Per Tube 2 times daily 12/07/22 1034 12/08/22 1903   12/03/22 0700  ceFAZolin (ANCEF) IVPB 2g/100 mL premix        2 g 200 mL/hr over 30 Minutes Intravenous To Radiology 11/30/22 1602 12/04/22 0700        Objective: Vitals:   01/01/23 0559 01/01/23 1341 01/01/23 2114 01/02/23 0441  BP: (!) 115/59 123/68 (!) 136/59 106/77  Pulse: 81 90 95 91  Resp: 14 18 16 16  $ Temp: 98.4 F (36.9 C) 97.9 F (36.6 C) 98.2 F (36.8 C) 97.9 F (36.6 C)  TempSrc: Oral Oral Oral Oral  SpO2: 100% 100% 100% 98%  Weight: 52.5 kg   54.1 kg  Height:        Intake/Output Summary (Last 24 hours) at 01/02/2023 1233 Last data filed at 01/02/2023 0502 Gross per 24 hour  Intake 702 ml  Output 400 ml  Net 302 ml   Filed Weights   12/28/22 1131 01/01/23 0559 01/02/23 0441  Weight: 45.9 kg 52.5 kg 54.1 kg    Examination:  General exam: Appears calm and comfortable  Respiratory system: Clear to auscultation. Respiratory effort normal. No respiratory  distress. No conversational dyspnea.  Cardiovascular system: S1 & S2 heard, RRR. No murmurs. No pedal edema. Gastrointestinal system: Abdomen is nondistended, soft and nontender. Normal bowel sounds heard. Central nervous system: Alert and oriented. Left sided hemiparesis  Extremities: Symmetric in appearance   Data Reviewed: I have personally reviewed following labs and imaging studies  CBC: Recent Labs  Lab 12/28/22 0623 12/29/22 0559 12/30/22 0610 12/31/22 0505 01/01/23 0532  WBC 9.6 8.6 9.2 8.9 9.7  HGB 8.7* 8.8* 8.6* 8.3* 8.6*  HCT 27.4* 27.9* 27.3* 27.0* 27.7*  MCV 93.5 94.6 94.5 95.4 94.2  PLT 236 268 278 289 [id number]   Basic Metabolic Panel: Recent Labs  Lab 12/29/22 0559 12/30/22 0610 01/01/23 0532  NA 137  --  138  K 3.9  --  4.1  CL 101  --  103  CO2 27  --  26  GLUCOSE 102*  --  115*  BUN 21  --  29*  CREATININE 0.67  --  0.68  CALCIUM 9.1  --  9.0  MG  --  2.3 2.4   GFR: Estimated Creatinine Clearance: 57.5 mL/min (by C-G formula based on SCr of 0.68 mg/dL). Liver Function Tests: No results for input(s): "AST", "ALT", "ALKPHOS", "BILITOT", "PROT", "ALBUMIN" in the last 168 hours. No results for input(s): "LIPASE", "AMYLASE" in the last 168 hours. No results for input(s): "AMMONIA" in the last 168 hours. Coagulation Profile: No results for input(s): "INR", "PROTIME" in the last 168 hours. Cardiac Enzymes: No results for input(s): "CKTOTAL", "CKMB", "CKMBINDEX", "TROPONINI" in the last 168 hours. BNP (last 3 results) No results for input(s): "PROBNP" in the last 8760 hours. HbA1C: No results for input(s): "HGBA1C" in the last 72 hours. CBG: No results for input(s): "GLUCAP" in the last 168 hours. Lipid Profile: No results for input(s): "CHOL", "HDL", "LDLCALC", "TRIG", "CHOLHDL", "LDLDIRECT" in the last 72 hours. Thyroid Function Tests: No results for input(s): "TSH", "T4TOTAL", "FREET4", "T3FREE", "THYROIDAB" in the last 72 hours. Anemia Panel: No  results for input(s): "VITAMINB12", "FOLATE", "FERRITIN", "TIBC", "IRON", "RETICCTPCT" in the last 72 hours. Sepsis Labs: No results for input(s): "PROCALCITON", "LATICACIDVEN" in the last 168 hours.  No results found for this or any previous visit (from the past 240 hour(s)).    Radiology Studies: No results found.    Scheduled Meds:  aspirin EC  81 mg Oral Daily   atorvastatin  80 mg Per Tube QPC supper   citalopram  10 mg Oral q morning   clopidogrel  75 mg Oral Daily   diclofenac Sodium  4 g Topical QID   diphenoxylate-atropine  2 tablet Per Tube QID   ezetimibe  10 mg Per Tube Daily   feeding supplement (JEVITY 1.5 CAL/FIBER)  1,000 mL Per Tube Q24H   feeding supplement (PROSource TF20)  60 mL Per Tube TID BM   free water  100 mL Per Tube Q4H   melatonin  3 mg Per Tube QHS   multivitamin with minerals  1 tablet Per Tube Daily   pantoprazole  40 mg Oral QAC breakfast   saccharomyces boulardii  250 mg Per Tube BID   thiamine  100 mg Per Tube Daily   tiZANidine  2 mg Per Tube BID   Continuous Infusions:   LOS: 32 days   Time spent: 25 minutes   Dessa Phi, DO [facility] 01/02/2023, 12:33 PM   Available via Epic secure chat 7am-7pm After these hours, please refer to coverage provider listed on [email or web address]

## 2023-01-02 NOTE — TOC Progression Note (Addendum)
Transition of Care [facility]) - Progression Note    Patient Details  Name: Kara Bailey MRN: [id number] Date of Birth: 07/28/1956  Transition of Care [facility]) CM/SW [place], RN Phone Number:[phone]  01/02/2023, 12:39 PM  Clinical Narrative:    CM received message from [facility] supervisor that [place] completed asset search and determined that patient has 2 properties and one car in her name which would prevent her from qualifying for long term medicaid. [facility] will not be able to offer a LOG for placement at this time. CM spoke with daughter Kara Bailey to make her aware of the asset search results and Kara Bailey states that these records are not correct and one of the properties was the patients mothers property and her name was taken off. Kara Bailey also states that patient had a jeep with her step father but the jeep is old. CM has explained to daughter that TOC did not complete this search we do not verify the finding. Daughter has been made aware that placement for patient right now does not have a payor source so the family will need to determine how they will care for the patient. Options offered are self pay at facility versus bringing patient home and providing care. Daughter states that she is going to follow up with DSS and she will get back with CM for plan. Daughter states that her mother is not safe at home because there is no one there to care for her. CM has advised daughter that patient and MD will be notified.  Cm at bedside to update patient on the events listed above. Patient verbalized understanding. Message to be sent to MD.   1434 Daughter at bedside states that she has been to social services and the case worker confirmed that patients case is still pending and that land listed in her name will not disqualify patient for long term medicaid. Daughter states that Kara Bailey at [place] promised her that determination would be made within 5 days.  CM has explained that TOC does not  make that determination and that [facility] leadership would need to verify this information before consideration for letter of guarantee. CM has attempted to explain to family that the bottom line barrier for placement is payor source. CM has provided Eligibility case managers information to [facility] supervisor Kara Bailey. Kara Bailey [place] [phone])   1448 CM just received notification from [place] Kara Bailey stating that she  spoke with Kara Bailey DSS case worker who confirmed that the  medicaid timeline is 45 days for the process to complete but DSS also was not aware about the properties. Property information has been provided and TOC will await response from DSS.   Expected Discharge Plan: Skilled Nursing Facility Barriers to Discharge: SNF Authorization Denied  Expected Discharge Plan and Services In-house Referral: NA Discharge Planning Services: CM Consult Post Acute Care Choice: [place] Living arrangements for the past 2 months: Apartment Expected Discharge Date: 12/07/22               DME Arranged: N/A DME Agency: NA       HH Arranged: NA HH Agency: NA         Social Determinants of Health (SDOH) Interventions SDOH Screenings   Food Insecurity: Unknown (12/04/2022)  Housing: Low Risk  (12/04/2022)  Transportation Needs: Unknown (12/04/2022)  Utilities: Unknown (12/04/2022)  Depression (PHQ2-9): High Risk (09/19/2022)  Tobacco Use: High Risk (12/30/2022)    Readmission Risk Interventions    12/17/2022  1:42 PM  Readmission Risk Prevention Plan  Transportation Screening Complete  PCP or Specialist Appt within 3-5 Days Complete  HRI or Home Care Consult Complete  Social Work Consult for [place] Planning/Counseling Complete  Palliative Care Screening Not Applicable  Medication Review Press photographer) Referral to Pharmacy

## 2023-01-02 NOTE — Progress Notes (Signed)
Pyxis would not allow waste of morphine concentrate 874m/74mL. Medication was sent up from pharmacy through secure code, not pulled from pyxis. Pharmacy notified. Tried to go through pyxis med link on epic but still unable to find medication for waste documentation. 2.560m1.274mL, was administered to patient and 7.74m74m.774mL was wasted in the cactus. Witnessed by [facility]

## 2023-01-03 DIAGNOSIS — I69391 Dysphagia following cerebral infarction: Secondary | ICD-10-CM | POA: Diagnosis not present

## 2023-01-03 NOTE — Plan of Care (Signed)
Patient [name], slurred speech at baseline, VSS throughout shift. Diminished lungs, IS encouraged. All meds given on time as ordered via G-tube. Pt c/o pain relieved by PRN morphine.  Tfs on-going at goal, 47m/hr. Dry gauze dressing intact over G-tube. Pt had bowel and bladder incontinence o/n.  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 POC maintained, will continue to monitor.  Problem: Education: Goal: Knowledge of General Education information will improve Description: Including pain rating scale, medication(s)/side effects and non-pharmacologic comfort measures Outcome: Progressing   Problem: Health Behavior/Discharge Planning: Goal: Ability to manage health-related needs will improve Outcome: Progressing   Problem: Clinical Measurements: Goal: Ability to maintain clinical measurements within normal limits will improve Outcome: Progressing Goal: Will remain free from infection Outcome: Progressing Goal: Diagnostic test results will improve Outcome: Progressing Goal: Respiratory complications will improve Outcome: Progressing Goal: Cardiovascular complication will be avoided Outcome: Progressing   Problem: Activity: Goal: Risk for activity intolerance will decrease Outcome: Progressing   Problem: Nutrition: Goal: Adequate nutrition will be maintained Outcome: Progressing   Problem: Coping: Goal: Level of anxiety will decrease Outcome: Progressing   Problem: Elimination: Goal: Will not experience complications related to bowel motility Outcome: Progressing Goal: Will not experience complications related to urinary retention Outcome: Progressing   Problem: Pain Managment: Goal: General experience of comfort will improve Outcome: Progressing   Problem: Safety: Goal: Ability to remain free from injury will improve Outcome: Progressing   Problem: Skin Integrity: Goal: Risk for impaired skin integrity will decrease Outcome: Progressing

## 2023-01-03 NOTE — Progress Notes (Signed)
PROGRESS NOTE    Kara Bailey  [id number] DOB: [birth year] DOA: 11/29/2022 PCP: Andree Moro, DO     Brief Narrative:  Kara Bailey is a 67 year old female with history of CVA, hypertension presented the emergency department at the request of her PCP for the evaluation of persistent dysphagia.  Found to have multifactorial dysphagia likely from stroke.  Status post G-tube placement by IR.  PT/OT recommending SNF on discharge.  Developed nausea, vomiting, abdominal pain on 1/26,  CT abdomen/pelvis showed possible cholecystitis, MRCP confirmed acute cholecystitis.  General surgery was  following.  Significant improvement abdominal pain, nausea, vomiting, liver enzymes.  She completed antibiotic course.  Hospital course remarkable for persistent diarrhea, anemia.  FOBT positive.  GI consulted, underwent EGD on 2/16.  She has had prolonged hospitalization.  Her insurance declined SNF so daughter has appealed, decision is pending.    New events last 24 hours / Subjective: No new issues overnight.   Assessment & Plan:  Principal Problem:   Dysphagia as late effect of cerebrovascular accident (CVA) Active Problems:   Essential hypertension   Left hemiplegia (HCC)   History of stroke   Dysphagia   Protein-calorie malnutrition, severe   Decreased hemoglobin   Heme positive stool   Dysphagia  -Likely secondary to residual effect of the ischemic stroke.  Has history of esophageal stricture last dilated on 2021.  GI consulted. Barium swallow done on 11/30/2022 was nondiagnostic.   -Underwent G-tube placement by IR on 1/22    Abdomen pain, nausea, vomiting/acute cholecystitis   -Likely acalculous cholecystitis.  -CT abdomen/pelvis, MRCP confirmed acute cholecystitis without stones.   -Completed Augmentin course. -General surgery signed off.  Diarrhea  -Thought to be from antibiotics and also tube feeding.  We requested nutritionist to change the tube diet.   -GI pathogen panel  negative.  CT abdomen/pelvis with contrast did not show any bowel inflammation or any other etiology to explain abdominal pain.  Tried to check C. Difficile but not approved by Dr Johnnye Sima.  Diarrhea has slowed down  -On Lomotil and probiotics   Normocytic anemia -Transfused with a unit of PRBC on 2/15 -FOBT checked and was positive.  Patient also mentioned that her diarrhea was black on 2/14.  Per GI- anemia is multifactorial.  External hemorrhoids noted on exam could be the cause of heme positive stool -EGD negative for any acute findings. Aspirin and Plavix resumed on 12/29/2022. -Protonix    History of ischemic stroke with dysarthria/dysphagia -Has a dense hemiplegia since September 2023.  On Plavix, aspirin, Lipitor, Zetia at home.  Also on baclofen and Zanaflex for left-sided hemiplegia and spasticity   Depression -Celexa   Debility/deconditioning -PT/OT recommending SNF on discharge. TOC following.  Insurance declined, family has appealed. No payor source. Prolonged hospitalization   In agreement with assessment of the pressure ulcer as below:  Pressure Injury 12/29/22 Sacrum Mid Stage 2 -  Partial thickness loss of dermis presenting as a shallow open injury with a red, pink wound bed without slough. (Active)  12/29/22 0830  Location: Sacrum  Location Orientation: Mid  Staging: Stage 2 -  Partial thickness loss of dermis presenting as a shallow open injury with a red, pink wound bed without slough.  Wound Description (Comments):   Present on Admission:   Dressing Type Foam - Lift dressing to assess site every shift 01/02/23 2000     Nutrition Problem: Severe Malnutrition Etiology: chronic illness, dysphagia (h/o CVA)   DVT prophylaxis:  SCDs Start: 11/30/22  0151  Code Status: Full Family Communication: None at bedside  Disposition Plan:  Status is: Inpatient Remains inpatient appropriate because: No payor source. Family will have to self-pay or take patient home. Per  family, unable to care for her at home.   Antimicrobials:  Anti-infectives (From admission, onward)    Start     Dose/Rate Route Frequency Ordered Stop   12/11/22 2200  amoxicillin-clavulanate (AUGMENTIN) 400-57 MG/5ML suspension 875 mg        875 mg Per Tube Every 12 hours 12/11/22 1137 12/18/22 1039   12/11/22 1000  amoxicillin-clavulanate (AUGMENTIN) 875-125 MG per tablet 1 tablet  Status:  Discontinued        1 tablet Oral Every 12 hours 12/11/22 0821 12/11/22 1137   12/08/22 2000  piperacillin-tazobactam (ZOSYN) IVPB 3.375 g  Status:  Discontinued        3.375 g 12.5 mL/hr over 240 Minutes Intravenous Every 8 hours 12/08/22 1900 12/11/22 0821   12/07/22 1130  cefadroxil (DURICEF) capsule 500 mg  Status:  Discontinued        500 mg Oral 2 times daily 12/07/22 1033 12/07/22 1033   12/07/22 1130  cefadroxil (DURICEF) capsule 500 mg  Status:  Discontinued        500 mg Oral 2 times daily 12/07/22 1033 12/07/22 1034   12/07/22 1130  cefadroxil (DURICEF) capsule 500 mg  Status:  Discontinued        500 mg Per Tube 2 times daily 12/07/22 1034 12/08/22 1903   12/03/22 0700  ceFAZolin (ANCEF) IVPB 2g/100 mL premix        2 g 200 mL/hr over 30 Minutes Intravenous To Radiology 11/30/22 1602 12/04/22 0700        Objective: Vitals:   01/02/23 0441 01/02/23 1432 01/02/23 2103 01/03/23 0514  BP: 106/77 130/71 (!) 119/56 106/67  Pulse: 91 99 96 90  Resp: 16 15 16 16  $ Temp: 97.9 F (36.6 C) 98.1 F (36.7 C) 98.7 F (37.1 C) 98 F (36.7 C)  TempSrc: Oral Oral Oral Oral  SpO2: 98% 98% 99% 96%  Weight: 54.1 kg   53.7 kg  Height:       No intake or output data in the 24 hours ending 01/03/23 1157  Filed Weights   01/01/23 0559 01/02/23 0441 01/03/23 0514  Weight: 52.5 kg 54.1 kg 53.7 kg    Examination:  General exam: Appears calm and comfortable   Data Reviewed: I have personally reviewed following labs and imaging studies  CBC: Recent Labs  Lab 12/28/22 0623 12/29/22 0559  12/30/22 0610 12/31/22 0505 01/01/23 0532  WBC 9.6 8.6 9.2 8.9 9.7  HGB 8.7* 8.8* 8.6* 8.3* 8.6*  HCT 27.4* 27.9* 27.3* 27.0* 27.7*  MCV 93.5 94.6 94.5 95.4 94.2  PLT 236 268 278 289 [id number]    Basic Metabolic Panel: Recent Labs  Lab 12/29/22 0559 12/30/22 0610 01/01/23 0532  NA 137  --  138  K 3.9  --  4.1  CL 101  --  103  CO2 27  --  26  GLUCOSE 102*  --  115*  BUN 21  --  29*  CREATININE 0.67  --  0.68  CALCIUM 9.1  --  9.0  MG  --  2.3 2.4    GFR: Estimated Creatinine Clearance: 57.1 mL/min (by C-G formula based on SCr of 0.68 mg/dL). Liver Function Tests: No results for input(s): "AST", "ALT", "ALKPHOS", "BILITOT", "PROT", "ALBUMIN" in the last 168 hours. No results for input(s): "  LIPASE", "AMYLASE" in the last 168 hours. No results for input(s): "AMMONIA" in the last 168 hours. Coagulation Profile: No results for input(s): "INR", "PROTIME" in the last 168 hours. Cardiac Enzymes: No results for input(s): "CKTOTAL", "CKMB", "CKMBINDEX", "TROPONINI" in the last 168 hours. BNP (last 3 results) No results for input(s): "PROBNP" in the last 8760 hours. HbA1C: No results for input(s): "HGBA1C" in the last 72 hours. CBG: No results for input(s): "GLUCAP" in the last 168 hours. Lipid Profile: No results for input(s): "CHOL", "HDL", "LDLCALC", "TRIG", "CHOLHDL", "LDLDIRECT" in the last 72 hours. Thyroid Function Tests: No results for input(s): "TSH", "T4TOTAL", "FREET4", "T3FREE", "THYROIDAB" in the last 72 hours. Anemia Panel: No results for input(s): "VITAMINB12", "FOLATE", "FERRITIN", "TIBC", "IRON", "RETICCTPCT" in the last 72 hours. Sepsis Labs: No results for input(s): "PROCALCITON", "LATICACIDVEN" in the last 168 hours.  No results found for this or any previous visit (from the past 240 hour(s)).    Radiology Studies: No results found.    Scheduled Meds:  aspirin EC  81 mg Oral Daily   atorvastatin  80 mg Per Tube QPC supper   citalopram  10 mg Oral q  morning   clopidogrel  75 mg Oral Daily   diclofenac Sodium  4 g Topical QID   diphenoxylate-atropine  2 tablet Per Tube QID   ezetimibe  10 mg Per Tube Daily   feeding supplement (JEVITY 1.5 CAL/FIBER)  1,000 mL Per Tube Q24H   feeding supplement (PROSource TF20)  60 mL Per Tube TID BM   free water  100 mL Per Tube Q4H   melatonin  3 mg Per Tube QHS   multivitamin with minerals  1 tablet Per Tube Daily   pantoprazole  40 mg Oral QAC breakfast   saccharomyces boulardii  250 mg Per Tube BID   thiamine  100 mg Per Tube Daily   tiZANidine  2 mg Per Tube BID   Continuous Infusions:   LOS: 33 days   Time spent: 15 minutes   Dessa Phi, DO [facility] 01/03/2023, 11:57 AM   Available via Epic secure chat 7am-7pm After these hours, please refer to coverage provider listed on [email or web address]

## 2023-01-03 NOTE — Progress Notes (Addendum)
Speech Language Pathology Treatment: Dysphagia; Patient Details Name: Kara Bailey MRN: [id number] DOB: 26-Aug-1956 Today's Date: 01/03/2023 Time: [id number] SLP Time Calculation (min) (ACUTE ONLY): 25 min  Assessment / Plan / Recommendation Clinical Impression  Patient seen for skilled SLP to address dysphagia. Today pt deemed appropriate for po trials. She articulates her concerns about not being accepted at [facility] - SLP offered quiet support. Obtained pt icecream bar *strawberry cheesecake* and snickers bar per her request when provided options. She consumed approx 75% of strawberry bar via tsp demonstrating prolonged oral transiting with adequate clearance over time. However once she consumed snickers bar nougat she demonstrating oral retention on the left without awareness - needing cues to sweep with spoon to midline to clear and use liquids to clear. Minimal increased work of breathing noted - and pt confirms but no indication of aspiration. Given limited choices on hospital menu, will advance diet to dys1/thin with precautions - pt agreeable and thankful. Using teach back, pt able to demonstrate adequate oral clearance.    HPI HPI: Kara Bailey is a 67 y.o. female who presented to [facility] ED on 11/29/22 with worsening dysphagia c/b inability to swallow pills or solid foods. She was sent to ED by PCP for PEG placement (PEG 12/03/22). PMHx includes CVAs, dysarthria,  first stroke seen in [facility] is 2022, HTN, prior epigastric hernia repair, iliac stent. Pt has a long hx of dysphagia and has worked with SLP in acute, inpatient, and OP settings over two years. She has had multiple MBS studies. 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  Pt's dysphagia has been primarily oral (difficulty with onset of oral phase and oral control) but pharyngeal  function has been largely intact with good bolus clearance and adequate airway protection. Review of 11/30/22 esophagram reveals similar mechanics of swallow. 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  SLP returned to determine if pt would participate in session - RMT and AAC application.  Upon arrival, pt reporting she is wet and uncomfortable.      SLP Plan  Continue with current plan of care      Recommendations for follow up therapy are one component of a multi-disciplinary discharge planning process, led by the attending physician.  Recommendations may be updated based on patient status, additional functional criteria and insurance authorization.    Recommendations  Diet recommendations: Dysphagia 1 (puree);Thin liquid Liquids provided via: Straw Medication Administration: Via alternative means Supervision: Patient able to self feed;Intermittent supervision to cue for compensatory strategies Compensations: Slow rate;Small sips/bites;Other (Comment);Lingual sweep for clearance of pocketing Postural Changes and/or Swallow Maneuvers: Seated upright 90 degrees;Upright 30-60 min after meal                Oral Care Recommendations: Oral care BID Follow Up Recommendations: Home health SLP Assistance recommended at discharge: Intermittent Supervision/Assistance SLP Visit Diagnosis: Dysarthria and anarthria (R47.1);Aphonia (R49.1);Dysphagia, oral phase (R13.11);Dysphagia, oropharyngeal phase (R13.12) Plan: Continue with current plan of care         Kathleen Lime, MS [place] Office [phone]   Macario Golds  01/03/2023, 4:35 PM

## 2023-01-03 NOTE — Progress Notes (Signed)
Speech Language Pathology Treatment: Cognitive-Linquistic  Patient Details Name: Kara Bailey MRN: [id number] DOB: 01/11/1956 Today's Date: 01/03/2023 Time: [id number] SLP Time Calculation (min) (ACUTE ONLY): 20 min  Assessment / Plan / Recommendation Clinical Impression  SLP separate session to address pt's dysarthria - and respiratory strength for intelligibilty.  Articulation initially improved but as session progressed and pt with decreased respiratory support- her dysarthria worsened. She is excellent at gesturing and using single words when needed. She is not using AAC program on her phone or tablet - stating she would rather "speak". Encouraged her to continue to slow rate of speech and articulate slowly with loud voice - she required moderate cues to successfully perform.   Encouraged her to IS - and handed it to her - which she used as SLP left the room.  Placed tan hand splint on pt, wrote on board time placed and informed NT.  Will follow up next date - including inititating RMST for phonatory and swallowing strength. Pt agreeable to plan.     HPI HPI: Kara Bailey is a 67 y.o. female who presented to [facility] ED on 11/29/22 with worsening dysphagia c/b inability to swallow pills or solid foods. She was sent to ED by PCP for PEG placement (PEG 12/03/22). PMHx includes CVAs, dysarthria,  first stroke seen in [facility] is 2022, HTN, prior epigastric hernia repair, iliac stent. Pt has a long hx of dysphagia and has worked with SLP in acute, inpatient, and OP settings over two years. She has had multiple MBS studies. 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  Pt's dysphagia has been primarily oral (difficulty with onset of oral phase and oral control) but pharyngeal function has been largely intact with good bolus clearance and adequate airway  protection. Review of 11/30/22 esophagram reveals similar mechanics of swallow. 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  SLP returned to determine if pt would participate in session - RMT and AAC application.  Upon arrival, pt reporting she is wet and uncomfortable.      SLP Plan  Continue with current plan of care      Recommendations for follow up therapy are one component of a multi-disciplinary discharge planning process, led by the attending physician.  Recommendations may be updated based on patient status, additional functional criteria and insurance authorization.    Recommendations  Diet recommendations: Dysphagia 1 (puree);Thin liquid Liquids provided via: Straw Medication Administration: Via alternative means Supervision: Patient able to self feed;Intermittent supervision to cue for compensatory strategies Compensations: Slow rate;Small sips/bites;Other (Comment);Lingual sweep for clearance of pocketing Postural Changes and/or Swallow Maneuvers: Seated upright 90 degrees;Upright 30-60 min after meal                Oral Care Recommendations: Oral care BID Follow Up Recommendations: Home health SLP Assistance recommended at discharge: Intermittent Supervision/Assistance SLP Visit Diagnosis: Dysarthria and anarthria (R47.1) Plan: Continue with current plan of care          Kathleen Lime, MS [place]  Macario Golds  01/03/2023, 4:46 PM

## 2023-01-03 NOTE — Progress Notes (Signed)
Nutrition Follow-up  DOCUMENTATION CODES:   Severe malnutrition in context of chronic illness  INTERVENTION:   -Continue Jevity 1.5 @ goal rate of 40 ml/hr via PEG -60 ml Prosource TF 20 TID -Provides 1680 kcals, 121g protein and 729 ml H2O, 20g fiber daily -Free water: 100 ml every 4 hours (600 ml)    -Multivitamin with minerals daily (crushed pill, no liquid d/t osmolality) -100 mg Thiamine daily  NUTRITION DIAGNOSIS:   Severe Malnutrition related to chronic illness, dysphagia (h/o CVA) as evidenced by moderate fat depletion, severe muscle depletion, energy intake < or equal to 75% for > or equal to 1 month.  Ongoing.  GOAL:   Patient will meet greater than or equal to 90% of their needs  Meeting with TF  MONITOR:   PO intake, Supplement acceptance, Labs, Weight trends, I & O's  ASSESSMENT:   66 year old African-American female history of multiple strokes, dense left hemiparesis, hypertension who presents to the ER today with worsening dysphagia.  Patient continues to tolerate Jevity 1.5. Diarrhea has improved. Per chart review, pt awaiting SNF bed placement.   Admission weight: 112 lbs Current weight: 118 lbs  Medications: Lomotil, Multivitamin with minerals daily, Florastor, Thiamine  Labs reviewed.  Diet Order:   Diet Order             Diet full liquid Room service appropriate? Yes; Fluid consistency: Thin  Diet effective now           Diet general                   EDUCATION NEEDS:   Education needs have been addressed  Skin:  Skin Assessment: Skin Integrity Issues: Skin Integrity Issues:: Stage II Stage II: mid sacrum  Last BM:  2/21 -type 6  Height:   Ht Readings from Last 1 Encounters:  12/28/22 5' 6"$  (1.676 m)    Weight:   Wt Readings from Last 1 Encounters:  01/03/23 53.7 kg    BMI:  Body mass index is 19.11 kg/m.  Estimated Nutritional Needs:   Kcal:  [id number]  Protein:  80-95g  Fluid:  1.8L/day  Clayton Bibles,  MS, RD, LDN Inpatient Clinical Dietitian Contact information available via Amion

## 2023-01-04 DIAGNOSIS — I69391 Dysphagia following cerebral infarction: Secondary | ICD-10-CM | POA: Diagnosis not present

## 2023-01-04 NOTE — Progress Notes (Signed)
Physical Therapy Treatment Patient Details Name: Kara Bailey MRN: [id number] DOB: [birth year] Today's Date: 01/04/2023   History of Present Illness Patient is 67 year old female admitted 11/29/22 with multifactorial dysphagia; s/p PEG tube 12/03/22; EGD 2/16.  Pt with hx of CVA with L hemiparesis, HTN, iliac stent, hep C, GERD    PT Comments    Pt's rehab has been denied by insurance and daughter looking into LTC placement as pt is not safe to return home alone per notes.  Pt requiring assist for mobility and not likely able to ambulate upon d/c so started working on wheelchair transfers today with sliding board (to possibly improve pt independence and reduce burden of care upon d/c).  Pt currently would need at least mod assist for transfers.   Recommendations for follow up therapy are one component of a multi-disciplinary discharge planning process, led by the attending physician.  Recommendations may be updated based on patient status, additional functional criteria and insurance authorization.  Follow Up Recommendations  Long-term institutional care without follow-up therapy Can patient physically be transported by private vehicle: No   Assistance Recommended at Discharge Frequent or constant Supervision/Assistance  Patient can return home with the following A little help with bathing/dressing/bathroom;Assistance with cooking/housework;Help with stairs or ramp for entrance;A lot of help with walking and/or transfers;Assist for transportation   Equipment Recommendations  None recommended by PT    Recommendations for Other Services       Precautions / Restrictions Precautions Precautions: Fall Precaution Comments: hx of CVA last year with L sided weakness; L knee buckles     Mobility  Bed Mobility Overal bed mobility: Needs Assistance Bed Mobility: Supine to Sit     Supine to sit: Min guard, HOB elevated     General bed mobility comments: provided gait belt for pt to self  assist Lt LE over EOB, cues for technique and using stronger Rt side to come to EOB, required rail    Transfers Overall transfer level: Needs assistance Equipment used: Sliding board Transfers: Bed to chair/wheelchair/BSC            Lateral/Scoot Transfers: Mod assist General transfer comment: educated pt on wheelchair pieces and setting up for lateral/scoot transfer, therapist placed sliding board and pt required mod assist to scoot towards stronger right side into w/c    Ambulation/Gait                   Theme park manager mobility: Yes Wheelchair Assistance Details (indicate cue type and reason): pt not able to propel due to right hemiplegia but educated on wheelchair parts; pt taken to look out hallway window which therapist pushed pt in w/c then returned to room  Modified Rankin (Stroke Patients Only)       Balance                                            Cognition Arousal/Alertness: Awake/alert Behavior During Therapy: WFL for tasks assessed/performed Overall Cognitive Status: Within Functional Limits for tasks assessed                                          Exercises  General Comments        Pertinent Vitals/Pain Pain Assessment Pain Assessment: No/denies pain Pain Intervention(s): Repositioned, Monitored during session    Home Living                          Prior Function            PT Goals (current goals can now be found in the care plan section) Acute Rehab PT Goals PT Goal Formulation: With patient Time For Goal Achievement: 01/18/23 Potential to Achieve Goals: Good Progress towards PT goals: Progressing toward goals    Frequency    Min 2X/week      PT Plan Discharge plan needs to be updated    Co-evaluation              AM-PAC PT "6 Clicks" Mobility   Outcome Measure  Help needed turning from your  back to your side while in a flat bed without using bedrails?: A Lot Help needed moving from lying on your back to sitting on the side of a flat bed without using bedrails?: A Lot Help needed moving to and from a bed to a chair (including a wheelchair)?: A Lot Help needed standing up from a chair using your arms (e.g., wheelchair or bedside chair)?: A Lot Help needed to walk in hospital room?: Total Help needed climbing 3-5 steps with a railing? : Total 6 Click Score: 10    End of Session Equipment Utilized During Treatment: Gait belt Activity Tolerance: Patient tolerated treatment well Patient left: in chair;with call bell/phone within reach (left in w/c with call bell, OT to session planned next)   PT Visit Diagnosis: Other abnormalities of gait and mobility (R26.89);Unsteadiness on feet (R26.81)     Time: 1335-1350 PT Time Calculation (min) (ACUTE ONLY): 15 min  Charges:  $Therapeutic Activity: 8-22 mins                     Jannette Spanner PT, DPT Physical Therapist Acute Rehabilitation Services Preferred contact method: Secure Chat Weekend Pager Only: [phone] Office: [place] 01/04/2023, 3:44 PM

## 2023-01-04 NOTE — Progress Notes (Signed)
Occupational Therapy Treatment Patient Details Name: ROSHNI MCCRITE MRN: [id number] DOB: 06-Sep-1956 Today's Date: 01/04/2023   History of present illness Patient is 67 year old female admitted 11/29/22 with multifactorial dysphagia; s/p PEG tube 12/03/22; EGD 2/16.  Pt with hx of CVA with L hemiparesis, HTN, iliac stent, hep C, GERD   OT comments  Reiterated need to wear splints to reduce increased tightness and potential contractures in left wrist and fingers. Therapist pointed to sign on the wall in regards to splint wear schedule and reiterated. Patient aware of therapist recommendations to wear splints but has predominantly not having them donned and not wearing them enough. Patient aware of hemitechniques for dressing and still needing assistance for ADLs and reports performing dressing similarily since AIR. Overall min assist for UB ADLs and mod assist for LB ADLs.  Therapist had patient stand using bed rail to assess standing needed for ADLs and potential BSC transfer. She needed a heavy mod assist due to initial posterior lean. She continues to exhibit fear with standing. Continues to need mod assist for transfers. Overall patient has not progressed in regards to functional abilities since this therapist saw her on 12/21/22. Overall expect minimal functional change with continued OT as she will always need some assist for ADLs and assist for standing needed for ADLs.  Patient needs either 24/7 caregiver assist or long term care at discharge. OT signing off.    Recommendations for follow up therapy are one component of a multi-disciplinary discharge planning process, led by the attending physician.  Recommendations may be updated based on patient status, additional functional criteria and insurance authorization.    Follow Up Recommendations  Long-term institutional care without follow-up therapy     Assistance Recommended at Discharge Frequent or constant Supervision/Assistance  Patient can  return home with the following  A lot of help with walking and/or transfers;A lot of help with bathing/dressing/bathroom;Assistance with cooking/housework;Direct supervision/assist for medications management;Direct supervision/assist for financial management;Help with stairs or ramp for entrance   Equipment Recommendations  None recommended by OT    Recommendations for Other Services      Precautions / Restrictions Precautions Precautions: Fall Precaution Comments: hx of CVA last year with L sided weakness; L knee buckles          Balance Overall balance assessment: Needs assistance Sitting-balance support: No upper extremity supported, Feet supported Sitting balance-Leahy Scale: Good     Standing balance support: Single extremity supported Standing balance-Leahy Scale: Poor                             ADL either performed or assessed with clinical judgement   ADL Overall ADL's : Needs assistance/impaired                 Upper Body Dressing : Sitting;Minimal assistance Upper Body Dressing Details (indicate cue type and reason): At edge of bed patient demonstrated hemitechnique to don upper body clothing. She needed assist due to tightness of shirt. With using loose gown she was able to get "shirt" on. Lower Body Dressing: Sitting/lateral leans;Minimal assistance Lower Body Dressing Details (indicate cue type and reason): Demonstrated ability to don depends over left leg, then right and then pullup close to hips in seated positioning. Unable to lean far enough leaft and right to get up over hips but does 75% of the job. SHe either needs help to pull up depends over hips in seated/leaned position or assistance to  stand then pull clothing up over hips. Either way - she needs assist for LB dressing                      Cognition Arousal/Alertness: Awake/alert Behavior During Therapy: WFL for tasks assessed/performed Overall Cognitive Status: Within  Functional Limits for tasks assessed                                                     Pertinent Vitals/ Pain       Pain Assessment Pain Assessment: Faces Faces Pain Scale: Hurts a little bit Pain Location: L shoulder, Pain Descriptors / Indicators: Grimacing Pain Intervention(s): Monitored during session  Home Living                                          Prior Functioning/Environment              Frequency           Progress Toward Goals  OT Goals(current goals can now be found in the care plan section)  Progress towards OT goals: Not progressing toward goals - comment (and discharged from therapy)     Plan Other (comment) (Goals not met - not expected to be met.)    Co-evaluation                 AM-PAC OT "6 Clicks" Daily Activity     Outcome Measure   Help from another person eating meals?: A Little Help from another person taking care of personal grooming?: A Little Help from another person toileting, which includes using toliet, bedpan, or urinal?: A Lot Help from another person bathing (including washing, rinsing, drying)?: A Little Help from another person to put on and taking off regular upper body clothing?: A Little Help from another person to put on and taking off regular lower body clothing?: A Lot 6 Click Score: 16    End of Session    OT Visit Diagnosis: Unsteadiness on feet (R26.81);Muscle weakness (generalized) (M62.81);Pain;Hemiplegia and hemiparesis Hemiplegia - Right/Left: Left Hemiplegia - dominant/non-dominant: Dominant Hemiplegia - caused by: Cerebral infarction Pain - Right/Left: Left Pain - part of body: Arm   Activity Tolerance Patient tolerated treatment well   Patient Left in bed;with call bell/phone within reach;with bed alarm set   Nurse Communication Mobility status        Time: [id number] OT Time Calculation (min): 19 min  Charges: OT General Charges $OT Visit: 1  Visit OT Treatments $Self Care/Home Management : 8-22 mins  Gustavo Lah, OTR/L [place]  Office [phone]   Lenward Chancellor 01/04/2023, 4:02 PM

## 2023-01-04 NOTE — Progress Notes (Signed)
Speech Language Pathology Treatment: Dysphagia  Patient Details Name: Kara Bailey MRN: [id number] DOB: 1956-05-04 Today's Date: 01/04/2023 Time: [id number] SLP Time Calculation (min) (ACUTE ONLY): 12 min  Assessment / Plan / Recommendation Clinical Impression  Patient seen for po trials including sausage/gravy biscuite. Pt is very aware and uses caution- not trying higher level texture than she is capable of managing. Her cousin arrived during session and pt appeared anxious to visit with him. Pt took approx 3 bites of sausage gravy - prolonged oral transiting noted with pt using liquids to clear. Pt winced with swallowing - pointing to chest - but denies discomfort. Suspect gravy may be too spicy for her, although she denied. Pt appears frustrated today as she reported she was waiting for this SLP this morning. Apologized to pt for delay and advised had to see new evaluations this am. Left pt with food in bag and requested her cousin set up for her before he leaves if pt wanted to try more. Also requested RN put some of pt's gravy in cup to warm up if pt wants some later. Will follow up next week for RMST *as pt will allow* and advanced texture po trials. Unfortunately in instituion, food choices are limited within specific textures. Pt benefited from minimum cues for compensation today.     HPI HPI: Kara Bailey is a 66 y.o. female who presented to [facility] ED on 11/29/22 with worsening dysphagia c/b inability to swallow pills or solid foods. She was sent to ED by PCP for PEG placement (PEG 12/03/22). PMHx includes CVAs, dysarthria,  first stroke seen in [facility] is 2022, HTN, prior epigastric hernia repair, iliac stent. Pt has a long hx of dysphagia and has worked with SLP in acute, inpatient, and OP settings over two years. She has had multiple MBS studies. 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  Pt's dysphagia has been primarily oral (difficulty with onset of oral phase and oral control) but pharyngeal function has been largely intact with good bolus clearance and adequate airway protection. Review of 11/30/22 esophagram reveals similar mechanics of swallow. 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  SLP returned to determine if pt would participate in session - RMT and AAC application.  Upon arrival, pt reporting she is wet and uncomfortable.      SLP Plan  Continue with current plan of care      Recommendations for follow up therapy are one component of a multi-disciplinary discharge planning process, led by the attending physician.  Recommendations may be updated based on patient status, additional functional criteria and insurance authorization.    Recommendations  Diet recommendations: Dysphagia 1 (puree);Thin liquid Liquids provided via: Straw Medication Administration: Via alternative means Supervision: Patient able to self feed;Intermittent supervision to cue for compensatory strategies Compensations: Slow rate;Small sips/bites;Other (Comment);Lingual sweep for clearance of pocketing Postural Changes and/or Swallow Maneuvers: Seated upright 90 degrees;Upright 30-60 min after meal                Oral Care Recommendations: Oral care BID Follow Up Recommendations: Home health SLP Assistance recommended at discharge: Intermittent Supervision/Assistance SLP Visit Diagnosis: Dysphagia, oral phase (R13.11) Plan: Continue with current plan of care          Kathleen Lime, MS [place]  Macario Golds  01/04/2023, 7:17 PM

## 2023-01-04 NOTE — Progress Notes (Signed)
PROGRESS NOTE    Kara Bailey  [id number] DOB: [birth year] DOA: 11/29/2022 PCP: Andree Moro, DO     Brief Narrative:  Kara Bailey is a 67 year old female with history of CVA, hypertension presented the emergency department at the request of her PCP for the evaluation of persistent dysphagia.  Found to have multifactorial dysphagia likely from stroke.  Status post G-tube placement by IR.  PT/OT recommending SNF on discharge.  Developed nausea, vomiting, abdominal pain on 1/26,  CT abdomen/pelvis showed possible cholecystitis, MRCP confirmed acute cholecystitis.  General surgery was  following.  Significant improvement abdominal pain, nausea, vomiting, liver enzymes.  She completed antibiotic course.  Hospital course remarkable for persistent diarrhea, anemia.  FOBT positive.  GI consulted, underwent EGD on 2/16.  She has had prolonged hospitalization.  Her insurance declined SNF so daughter has appealed. For now appears that there is no payor source for SNF, unless family self-pays.   New events last 24 hours / Subjective: No new issues overnight. Diet advanced by SLP to dysphagia 1 diet.   Assessment & Plan:  Principal Problem:   Dysphagia as late effect of cerebrovascular accident (CVA) Active Problems:   Essential hypertension   Left hemiplegia (HCC)   History of stroke   Dysphagia   Protein-calorie malnutrition, severe   Decreased hemoglobin   Heme positive stool   Dysphagia  -Likely secondary to residual effect of the ischemic stroke.  Has history of esophageal stricture last dilated on 2021.  GI consulted. Barium swallow done on 11/30/2022 was nondiagnostic.   -Underwent G-tube placement by IR on 1/22    Abdomen pain, nausea, vomiting/acute cholecystitis   -Likely acalculous cholecystitis.  -CT abdomen/pelvis, MRCP confirmed acute cholecystitis without stones.   -Completed Augmentin course. -General surgery signed off.  Diarrhea  -Thought to be from antibiotics  and also tube feeding.  We requested nutritionist to change the tube diet.   -GI pathogen panel negative.  CT abdomen/pelvis with contrast did not show any bowel inflammation or any other etiology to explain abdominal pain.  Tried to check C. Difficile but not approved by Dr Johnnye Sima.  Diarrhea has slowed down  -On Lomotil and probiotics   Normocytic anemia -Transfused with a unit of PRBC on 2/15 -FOBT checked and was positive.  Patient also mentioned that her diarrhea was black on 2/14.  Per GI- anemia is multifactorial.  External hemorrhoids noted on exam could be the cause of heme positive stool -EGD negative for any acute findings. Aspirin and Plavix resumed on 12/29/2022. -Protonix    History of ischemic stroke with dysarthria/dysphagia -Has a dense hemiplegia since September 2023.  On Plavix, aspirin, Lipitor, Zetia at home.  Also on baclofen and Zanaflex for left-sided hemiplegia and spasticity   Depression -Celexa   Debility/deconditioning -PT/OT recommending SNF on discharge. TOC following.  Insurance declined, family has appealed. No payor source. Prolonged hospitalization   In agreement with assessment of the pressure ulcer as below:  Pressure Injury 12/29/22 Sacrum Mid Stage 2 -  Partial thickness loss of dermis presenting as a shallow open injury with a red, pink wound bed without slough. (Active)  12/29/22 0830  Location: Sacrum  Location Orientation: Mid  Staging: Stage 2 -  Partial thickness loss of dermis presenting as a shallow open injury with a red, pink wound bed without slough.  Wound Description (Comments):   Present on Admission:   Dressing Type Foam - Lift dressing to assess site every shift 01/04/23 0750  Nutrition Problem: Severe Malnutrition Etiology: chronic illness, dysphagia (h/o CVA)   DVT prophylaxis:  SCDs Start: 11/30/22 0151  Code Status: Full Family Communication: None at bedside  Disposition Plan:  Status is: Inpatient Remains  inpatient appropriate because: No payor source. Family will have to self-pay or take patient home. Per family, unable to care for her at home.   Antimicrobials:  Anti-infectives (From admission, onward)    Start     Dose/Rate Route Frequency Ordered Stop   12/11/22 2200  amoxicillin-clavulanate (AUGMENTIN) 400-57 MG/5ML suspension 875 mg        875 mg Per Tube Every 12 hours 12/11/22 1137 12/18/22 1039   12/11/22 1000  amoxicillin-clavulanate (AUGMENTIN) 875-125 MG per tablet 1 tablet  Status:  Discontinued        1 tablet Oral Every 12 hours 12/11/22 0821 12/11/22 1137   12/08/22 2000  piperacillin-tazobactam (ZOSYN) IVPB 3.375 g  Status:  Discontinued        3.375 g 12.5 mL/hr over 240 Minutes Intravenous Every 8 hours 12/08/22 1900 12/11/22 0821   12/07/22 1130  cefadroxil (DURICEF) capsule 500 mg  Status:  Discontinued        500 mg Oral 2 times daily 12/07/22 1033 12/07/22 1033   12/07/22 1130  cefadroxil (DURICEF) capsule 500 mg  Status:  Discontinued        500 mg Oral 2 times daily 12/07/22 1033 12/07/22 1034   12/07/22 1130  cefadroxil (DURICEF) capsule 500 mg  Status:  Discontinued        500 mg Per Tube 2 times daily 12/07/22 1034 12/08/22 1903   12/03/22 0700  ceFAZolin (ANCEF) IVPB 2g/100 mL premix        2 g 200 mL/hr over 30 Minutes Intravenous To Radiology 11/30/22 1602 12/04/22 0700        Objective: Vitals:   01/03/23 0514 01/03/23 1338 01/03/23 2130 01/04/23 0543  BP: 106/67 (!) 112/54 102/73 (!) 105/57  Pulse: 90 85 88 82  Resp: '16 18 14 14  '$ Temp: 98 F (36.7 C) 97.8 F (36.6 C) 98.4 F (36.9 C) 98.4 F (36.9 C)  TempSrc: Oral Oral Oral Oral  SpO2: 96% 98% 98% 99%  Weight: 53.7 kg   51.8 kg  Height:       No intake or output data in the 24 hours ending 01/04/23 1207  Filed Weights   01/02/23 0441 01/03/23 0514 01/04/23 0543  Weight: 54.1 kg 53.7 kg 51.8 kg    Examination:  General exam: Appears calm and comfortable   Data Reviewed: I have  personally reviewed following labs and imaging studies  CBC: Recent Labs  Lab 12/29/22 0559 12/30/22 0610 12/31/22 0505 01/01/23 0532  WBC 8.6 9.2 8.9 9.7  HGB 8.8* 8.6* 8.3* 8.6*  HCT 27.9* 27.3* 27.0* 27.7*  MCV 94.6 94.5 95.4 94.2  PLT 268 278 289 [id number]    Basic Metabolic Panel: Recent Labs  Lab 12/29/22 0559 12/30/22 0610 01/01/23 0532  NA 137  --  138  K 3.9  --  4.1  CL 101  --  103  CO2 27  --  26  GLUCOSE 102*  --  115*  BUN 21  --  29*  CREATININE 0.67  --  0.68  CALCIUM 9.1  --  9.0  MG  --  2.3 2.4    GFR: Estimated Creatinine Clearance: 55 mL/min (by C-G formula based on SCr of 0.68 mg/dL). Liver Function Tests: No results for input(s): "AST", "ALT", "ALKPHOS", "  BILITOT", "PROT", "ALBUMIN" in the last 168 hours. No results for input(s): "LIPASE", "AMYLASE" in the last 168 hours. No results for input(s): "AMMONIA" in the last 168 hours. Coagulation Profile: No results for input(s): "INR", "PROTIME" in the last 168 hours. Cardiac Enzymes: No results for input(s): "CKTOTAL", "CKMB", "CKMBINDEX", "TROPONINI" in the last 168 hours. BNP (last 3 results) No results for input(s): "PROBNP" in the last 8760 hours. HbA1C: No results for input(s): "HGBA1C" in the last 72 hours. CBG: No results for input(s): "GLUCAP" in the last 168 hours. Lipid Profile: No results for input(s): "CHOL", "HDL", "LDLCALC", "TRIG", "CHOLHDL", "LDLDIRECT" in the last 72 hours. Thyroid Function Tests: No results for input(s): "TSH", "T4TOTAL", "FREET4", "T3FREE", "THYROIDAB" in the last 72 hours. Anemia Panel: No results for input(s): "VITAMINB12", "FOLATE", "FERRITIN", "TIBC", "IRON", "RETICCTPCT" in the last 72 hours. Sepsis Labs: No results for input(s): "PROCALCITON", "LATICACIDVEN" in the last 168 hours.  No results found for this or any previous visit (from the past 240 hour(s)).    Radiology Studies: No results found.    Scheduled Meds:  aspirin EC  81 mg Oral Daily    atorvastatin  80 mg Per Tube QPC supper   citalopram  10 mg Oral q morning   clopidogrel  75 mg Oral Daily   diclofenac Sodium  4 g Topical QID   diphenoxylate-atropine  2 tablet Per Tube QID   ezetimibe  10 mg Per Tube Daily   feeding supplement (JEVITY 1.5 CAL/FIBER)  1,000 mL Per Tube Q24H   feeding supplement (PROSource TF20)  60 mL Per Tube TID BM   free water  100 mL Per Tube Q4H   melatonin  3 mg Per Tube QHS   multivitamin with minerals  1 tablet Per Tube Daily   pantoprazole  40 mg Oral QAC breakfast   saccharomyces boulardii  250 mg Per Tube BID   thiamine  100 mg Per Tube Daily   tiZANidine  2 mg Per Tube BID   Continuous Infusions:   LOS: 34 days   Time spent: 15 minutes   Dessa Phi, DO [facility] 01/04/2023, 12:07 PM   Available via Epic secure chat 7am-7pm After these hours, please refer to coverage provider listed on [email or web address]

## 2023-01-05 NOTE — Progress Notes (Signed)
Kara NOTE    JAKLYN Bailey  [id number] DOB: [birth year] DOA: 11/29/2022 PCP: Andree Moro, DO     Brief Narrative:  ORQUIDIA FRYDRYCH is a 67 year old female with history of CVA, hypertension presented the emergency department at the request of her PCP for the evaluation of persistent dysphagia.  Found to have multifactorial dysphagia likely from stroke.  Status post G-tube placement by IR.  PT/OT recommending SNF on discharge.  Developed nausea, vomiting, abdominal pain on 1/26,  CT abdomen/pelvis showed possible cholecystitis, MRCP confirmed acute cholecystitis.  General surgery was  following.  Significant improvement abdominal pain, nausea, vomiting, liver enzymes.  She completed antibiotic course.  Hospital course remarkable for persistent diarrhea, anemia.  FOBT positive.  GI consulted, underwent EGD on 2/16.  She has had prolonged hospitalization.  Her insurance declined SNF so daughter has appealed. For now appears that there is no payor source for SNF, unless family self-pays.   New events last 24 hours / Subjective: No change.   Assessment & Plan:  Principal Problem:   Dysphagia as late effect of cerebrovascular accident (CVA) Active Problems:   Essential hypertension   Left hemiplegia (HCC)   History of stroke   Dysphagia   Protein-calorie malnutrition, severe   Decreased hemoglobin   Heme positive stool   Dysphagia  -Likely secondary to residual effect of the ischemic stroke.  Has history of esophageal stricture last dilated on 2021.  GI consulted. Barium swallow done on 11/30/2022 was nondiagnostic.   -Underwent G-tube placement by IR on 1/22    Abdomen pain, nausea, vomiting/acute cholecystitis   -Likely acalculous cholecystitis.  -CT abdomen/pelvis, MRCP confirmed acute cholecystitis without stones.   -Completed Augmentin course. -General surgery signed off.  Diarrhea  -Thought to be from antibiotics and also tube feeding.  We requested nutritionist to  change the tube diet.   -GI pathogen panel negative.  CT abdomen/pelvis with contrast did not show any bowel inflammation or any other etiology to explain abdominal pain.  Tried to check C. Difficile but not approved by Dr Johnnye Sima.  Diarrhea has slowed down  -On Lomotil and probiotics   Normocytic anemia -Transfused with a unit of PRBC on 2/15 -FOBT checked and was positive.  Patient also mentioned that her diarrhea was black on 2/14.  Per GI- anemia is multifactorial.  External hemorrhoids noted on exam could be the cause of heme positive stool -EGD negative for any acute findings. Aspirin and Plavix resumed on 12/29/2022. -Protonix    History of ischemic stroke with dysarthria/dysphagia -Has a dense hemiplegia since September 2023.  On Plavix, aspirin, Lipitor, Zetia at home.  Also on baclofen and Zanaflex for left-sided hemiplegia and spasticity   Depression -Celexa   Debility/deconditioning -PT/OT recommending SNF on discharge. TOC following.  Insurance declined, family has appealed. No payor source. Prolonged hospitalization   In agreement with assessment of the pressure ulcer as below:  Pressure Injury 12/29/22 Sacrum Mid Stage 2 -  Partial thickness loss of dermis presenting as a shallow open injury with a red, pink wound bed without slough. (Active)  12/29/22 0830  Location: Sacrum  Location Orientation: Mid  Staging: Stage 2 -  Partial thickness loss of dermis presenting as a shallow open injury with a red, pink wound bed without slough.  Wound Description (Comments):   Present on Admission:   Dressing Type Foam - Lift dressing to assess site every shift 01/05/23 0735     Nutrition Problem: Severe Malnutrition Etiology: chronic illness, dysphagia (h/o CVA)  DVT prophylaxis:  SCDs Start: 11/30/22 0151  Code Status: Full Family Communication: None at bedside  Disposition Plan:  Status is: Inpatient Remains inpatient appropriate because: No payor source. Family will  have to self-pay or take patient home. Per family, unable to care for her at home.   Antimicrobials:  Anti-infectives (From admission, onward)    Start     Dose/Rate Route Frequency Ordered Stop   12/11/22 2200  amoxicillin-clavulanate (AUGMENTIN) 400-57 MG/5ML suspension 875 mg        875 mg Per Tube Every 12 hours 12/11/22 1137 12/18/22 1039   12/11/22 1000  amoxicillin-clavulanate (AUGMENTIN) 875-125 MG per tablet 1 tablet  Status:  Discontinued        1 tablet Oral Every 12 hours 12/11/22 0821 12/11/22 1137   12/08/22 2000  piperacillin-tazobactam (ZOSYN) IVPB 3.375 g  Status:  Discontinued        3.375 g 12.5 mL/hr over 240 Minutes Intravenous Every 8 hours 12/08/22 1900 12/11/22 0821   12/07/22 1130  cefadroxil (DURICEF) capsule 500 mg  Status:  Discontinued        500 mg Oral 2 times daily 12/07/22 1033 12/07/22 1033   12/07/22 1130  cefadroxil (DURICEF) capsule 500 mg  Status:  Discontinued        500 mg Oral 2 times daily 12/07/22 1033 12/07/22 1034   12/07/22 1130  cefadroxil (DURICEF) capsule 500 mg  Status:  Discontinued        500 mg Per Tube 2 times daily 12/07/22 1034 12/08/22 1903   12/03/22 0700  ceFAZolin (ANCEF) IVPB 2g/100 mL premix        2 g 200 mL/hr over 30 Minutes Intravenous To Radiology 11/30/22 1602 12/04/22 0700        Objective: Vitals:   01/04/23 0543 01/04/23 1401 01/04/23 2225 01/05/23 0559  BP: (!) 105/57 111/76 129/68 (!) 113/52  Pulse: 82 88 84 82  Resp: '14 18 17 19  '$ Temp: 98.4 F (36.9 C) (!) 97.4 F (36.3 C) 98 F (36.7 C) 97.9 F (36.6 C)  TempSrc: Oral Oral Oral Oral  SpO2: 99% 100% 95% 98%  Weight: 51.8 kg     Height:       No intake or output data in the 24 hours ending 01/05/23 1256  Filed Weights   01/02/23 0441 01/03/23 0514 01/04/23 0543  Weight: 54.1 kg 53.7 kg 51.8 kg    Examination:  General exam: Appears calm and comfortable   Data Reviewed: I have personally reviewed following labs and imaging  studies  CBC: Recent Labs  Lab 12/30/22 0610 12/31/22 0505 01/01/23 0532  WBC 9.2 8.9 9.7  HGB 8.6* 8.3* 8.6*  HCT 27.3* 27.0* 27.7*  MCV 94.5 95.4 94.2  PLT 278 289 [id number]    Basic Metabolic Panel: Recent Labs  Lab 12/30/22 0610 01/01/23 0532  NA  --  138  K  --  4.1  CL  --  103  CO2  --  26  GLUCOSE  --  115*  BUN  --  29*  CREATININE  --  0.68  CALCIUM  --  9.0  MG 2.3 2.4    GFR: Estimated Creatinine Clearance: 55 mL/min (by C-G formula based on SCr of 0.68 mg/dL). Liver Function Tests: No results for input(s): "AST", "ALT", "ALKPHOS", "BILITOT", "PROT", "ALBUMIN" in the last 168 hours. No results for input(s): "LIPASE", "AMYLASE" in the last 168 hours. No results for input(s): "AMMONIA" in the last 168 hours. Coagulation Profile: No  results for input(s): "INR", "PROTIME" in the last 168 hours. Cardiac Enzymes: No results for input(s): "CKTOTAL", "CKMB", "CKMBINDEX", "TROPONINI" in the last 168 hours. BNP (last 3 results) No results for input(s): "PROBNP" in the last 8760 hours. HbA1C: No results for input(s): "HGBA1C" in the last 72 hours. CBG: No results for input(s): "GLUCAP" in the last 168 hours. Lipid Profile: No results for input(s): "CHOL", "HDL", "LDLCALC", "TRIG", "CHOLHDL", "LDLDIRECT" in the last 72 hours. Thyroid Function Tests: No results for input(s): "TSH", "T4TOTAL", "FREET4", "T3FREE", "THYROIDAB" in the last 72 hours. Anemia Panel: No results for input(s): "VITAMINB12", "FOLATE", "FERRITIN", "TIBC", "IRON", "RETICCTPCT" in the last 72 hours. Sepsis Labs: No results for input(s): "PROCALCITON", "LATICACIDVEN" in the last 168 hours.  No results found for this or any previous visit (from the past 240 hour(s)).    Radiology Studies: No results found.    Scheduled Meds:  aspirin EC  81 mg Oral Daily   atorvastatin  80 mg Per Tube QPC supper   citalopram  10 mg Oral q morning   clopidogrel  75 mg Oral Daily   diclofenac Sodium  4 g  Topical QID   diphenoxylate-atropine  2 tablet Per Tube QID   ezetimibe  10 mg Per Tube Daily   feeding supplement (JEVITY 1.5 CAL/FIBER)  1,000 mL Per Tube Q24H   feeding supplement (PROSource TF20)  60 mL Per Tube TID BM   free water  100 mL Per Tube Q4H   melatonin  3 mg Per Tube QHS   multivitamin with minerals  1 tablet Per Tube Daily   pantoprazole  40 mg Oral QAC breakfast   saccharomyces boulardii  250 mg Per Tube BID   thiamine  100 mg Per Tube Daily   tiZANidine  2 mg Per Tube BID   Continuous Infusions:   LOS: 35 days   Time spent: 10 minutes   Dessa Phi, DO [facility] 01/05/2023, 12:56 PM   Available via Epic secure chat 7am-7pm After these hours, please refer to coverage provider listed on [email or web address]

## 2023-01-06 MED ORDER — ORAL CARE MOUTH RINSE: 15.0000 mL | OROMUCOSAL | Status: DC | PRN

## 2023-01-06 MED ORDER — ORAL CARE MOUTH RINSE
15.0000 mL | OROMUCOSAL | Status: DC
  Administered 2023-01-06 – 2023-01-14 (×34): 15 mL via OROMUCOSAL

## 2023-01-06 NOTE — Progress Notes (Signed)
PROGRESS NOTE    Kara Bailey  [id number] DOB: [birth year] DOA: 11/29/2022 PCP: Andree Moro, DO     Brief Narrative:  Kara Bailey is a 67 year old female with history of CVA, hypertension presented the emergency department at the request of her PCP for the evaluation of persistent dysphagia.  Found to have multifactorial dysphagia likely from stroke.  Status post G-tube placement by IR.  PT/OT recommending SNF on discharge.  Developed nausea, vomiting, abdominal pain on 1/26,  CT abdomen/pelvis showed possible cholecystitis, MRCP confirmed acute cholecystitis.  General surgery was  following.  Significant improvement abdominal pain, nausea, vomiting, liver enzymes.  She completed antibiotic course.  Hospital course remarkable for persistent diarrhea, anemia.  FOBT positive.  GI consulted, underwent EGD on 2/16.  She has had prolonged hospitalization.  Her insurance declined SNF so daughter has appealed. For now appears that there is no payor source for SNF, unless family self-pays.   New events last 24 hours / Subjective: No change.   Assessment & Plan:  Principal Problem:   Dysphagia as late effect of cerebrovascular accident (CVA) Active Problems:   Essential hypertension   Left hemiplegia (HCC)   History of stroke   Dysphagia   Protein-calorie malnutrition, severe   Decreased hemoglobin   Heme positive stool   Dysphagia  -Likely secondary to residual effect of the ischemic stroke.  Has history of esophageal stricture last dilated on 2021.  GI consulted. Barium swallow done on 11/30/2022 was nondiagnostic.   -Underwent G-tube placement by IR on 1/22    Abdomen pain, nausea, vomiting/acute cholecystitis   -Likely acalculous cholecystitis.  -CT abdomen/pelvis, MRCP confirmed acute cholecystitis without stones.   -Completed Augmentin course. -General surgery signed off.  Diarrhea  -Thought to be from antibiotics and also tube feeding.  We requested nutritionist to  change the tube diet.   -GI pathogen panel negative.  CT abdomen/pelvis with contrast did not show any bowel inflammation or any other etiology to explain abdominal pain.  Tried to check C. Difficile but not approved by Dr Johnnye Sima.  Diarrhea has slowed down  -On Lomotil and probiotics   Normocytic anemia -Transfused with a unit of PRBC on 2/15 -FOBT checked and was positive.  Patient also mentioned that her diarrhea was black on 2/14.  Per GI- anemia is multifactorial.  External hemorrhoids noted on exam could be the cause of heme positive stool -EGD negative for any acute findings. Aspirin and Plavix resumed on 12/29/2022. -Protonix    History of ischemic stroke with dysarthria/dysphagia -Has a dense hemiplegia since September 2023.  On Plavix, aspirin, Lipitor, Zetia at home.  Also on baclofen and Zanaflex for left-sided hemiplegia and spasticity   Depression -Celexa   Debility/deconditioning -PT/OT recommending SNF on discharge. TOC following.  Insurance declined, family has appealed. No payor source. Prolonged hospitalization   In agreement with assessment of the pressure ulcer as below:  Pressure Injury 12/29/22 Sacrum Mid Stage 2 -  Partial thickness loss of dermis presenting as a shallow open injury with a red, pink wound bed without slough. (Active)  12/29/22 0830  Location: Sacrum  Location Orientation: Mid  Staging: Stage 2 -  Partial thickness loss of dermis presenting as a shallow open injury with a red, pink wound bed without slough.  Wound Description (Comments):   Present on Admission:   Dressing Type Foam - Lift dressing to assess site every shift 01/06/23 0737     Nutrition Problem: Severe Malnutrition Etiology: chronic illness, dysphagia (h/o CVA)  DVT prophylaxis:  SCDs Start: 11/30/22 0151  Code Status: Full Family Communication: None at bedside  Disposition Plan:  Status is: Inpatient Remains inpatient appropriate because: No payor source. Family will  have to self-pay or take patient home. Per family, unable to care for her at home.   Antimicrobials:  Anti-infectives (From admission, onward)    Start     Dose/Rate Route Frequency Ordered Stop   12/11/22 2200  amoxicillin-clavulanate (AUGMENTIN) 400-57 MG/5ML suspension 875 mg        875 mg Per Tube Every 12 hours 12/11/22 1137 12/18/22 1039   12/11/22 1000  amoxicillin-clavulanate (AUGMENTIN) 875-125 MG per tablet 1 tablet  Status:  Discontinued        1 tablet Oral Every 12 hours 12/11/22 0821 12/11/22 1137   12/08/22 2000  piperacillin-tazobactam (ZOSYN) IVPB 3.375 g  Status:  Discontinued        3.375 g 12.5 mL/hr over 240 Minutes Intravenous Every 8 hours 12/08/22 1900 12/11/22 0821   12/07/22 1130  cefadroxil (DURICEF) capsule 500 mg  Status:  Discontinued        500 mg Oral 2 times daily 12/07/22 1033 12/07/22 1033   12/07/22 1130  cefadroxil (DURICEF) capsule 500 mg  Status:  Discontinued        500 mg Oral 2 times daily 12/07/22 1033 12/07/22 1034   12/07/22 1130  cefadroxil (DURICEF) capsule 500 mg  Status:  Discontinued        500 mg Per Tube 2 times daily 12/07/22 1034 12/08/22 1903   12/03/22 0700  ceFAZolin (ANCEF) IVPB 2g/100 mL premix        2 g 200 mL/hr over 30 Minutes Intravenous To Radiology 11/30/22 1602 12/04/22 0700        Objective: Vitals:   01/05/23 0559 01/05/23 1347 01/05/23 2045 01/06/23 0551  BP: (!) 113/52 (!) 144/64 138/66 121/70  Pulse: 82 78 81 80  Resp: '19 18 17 17  '$ Temp: 97.9 F (36.6 C) 98.2 F (36.8 C) 98.1 F (36.7 C) 98.2 F (36.8 C)  TempSrc: Oral  Oral Oral  SpO2: 98% 98% 99% 99%  Weight:    53.3 kg  Height:        Intake/Output Summary (Last 24 hours) at 01/06/2023 1229 Last data filed at 01/05/2023 1700 Gross per 24 hour  Intake 0 ml  Output --  Net 0 ml    Filed Weights   01/03/23 0514 01/04/23 0543 01/06/23 0551  Weight: 53.7 kg 51.8 kg 53.3 kg    Examination:  General exam: Appears calm and comfortable   Data  Reviewed: I have personally reviewed following labs and imaging studies  CBC: Recent Labs  Lab 12/31/22 0505 01/01/23 0532  WBC 8.9 9.7  HGB 8.3* 8.6*  HCT 27.0* 27.7*  MCV 95.4 94.2  PLT 289 123XX123    Basic Metabolic Panel: Recent Labs  Lab 01/01/23 0532  NA 138  K 4.1  CL 103  CO2 26  GLUCOSE 115*  BUN 29*  CREATININE 0.68  CALCIUM 9.0  MG 2.4    GFR: Estimated Creatinine Clearance: 56.6 mL/min (by C-G formula based on SCr of 0.68 mg/dL). Liver Function Tests: No results for input(s): "AST", "ALT", "ALKPHOS", "BILITOT", "PROT", "ALBUMIN" in the last 168 hours. No results for input(s): "LIPASE", "AMYLASE" in the last 168 hours. No results for input(s): "AMMONIA" in the last 168 hours. Coagulation Profile: No results for input(s): "INR", "PROTIME" in the last 168 hours. Cardiac Enzymes: No results for  input(s): "CKTOTAL", "CKMB", "CKMBINDEX", "TROPONINI" in the last 168 hours. BNP (last 3 results) No results for input(s): "PROBNP" in the last 8760 hours. HbA1C: No results for input(s): "HGBA1C" in the last 72 hours. CBG: No results for input(s): "GLUCAP" in the last 168 hours. Lipid Profile: No results for input(s): "CHOL", "HDL", "LDLCALC", "TRIG", "CHOLHDL", "LDLDIRECT" in the last 72 hours. Thyroid Function Tests: No results for input(s): "TSH", "T4TOTAL", "FREET4", "T3FREE", "THYROIDAB" in the last 72 hours. Anemia Panel: No results for input(s): "VITAMINB12", "FOLATE", "FERRITIN", "TIBC", "IRON", "RETICCTPCT" in the last 72 hours. Sepsis Labs: No results for input(s): "PROCALCITON", "LATICACIDVEN" in the last 168 hours.  No results found for this or any previous visit (from the past 240 hour(s)).    Radiology Studies: No results found.    Scheduled Meds:  aspirin EC  81 mg Oral Daily   atorvastatin  80 mg Per Tube QPC supper   citalopram  10 mg Oral q morning   clopidogrel  75 mg Oral Daily   diclofenac Sodium  4 g Topical QID    diphenoxylate-atropine  2 tablet Per Tube QID   ezetimibe  10 mg Per Tube Daily   feeding supplement (JEVITY 1.5 CAL/FIBER)  1,000 mL Per Tube Q24H   feeding supplement (PROSource TF20)  60 mL Per Tube TID BM   free water  100 mL Per Tube Q4H   melatonin  3 mg Per Tube QHS   multivitamin with minerals  1 tablet Per Tube Daily   mouth rinse  15 mL Mouth Rinse 4 times per day   pantoprazole  40 mg Oral QAC breakfast   saccharomyces boulardii  250 mg Per Tube BID   thiamine  100 mg Per Tube Daily   tiZANidine  2 mg Per Tube BID   Continuous Infusions:   LOS: 36 days   Time spent: 10 minutes   Dessa Phi, DO [facility] 01/06/2023, 12:29 PM   Available via Epic secure chat 7am-7pm After these hours, please refer to coverage provider listed on [email or web address]

## 2023-01-07 DIAGNOSIS — I69391 Dysphagia following cerebral infarction: Secondary | ICD-10-CM | POA: Diagnosis not present

## 2023-01-07 NOTE — TOC Progression Note (Signed)
Transition of Care [facility]) - Progression Note    Patient Details  Name: Kara Bailey MRN: [id number] Date of Birth: 1956/10/08  [facility] CM/SW Contact  Joaquin Courts, RN Phone Number: 01/07/2023, 3:51 PM  Clinical Narrative:     CM spoke with DSS medicaid worker who requested additional information from patient regarding her maiden name, middle name, ex-spouse name, clarity on property ownership and vehicle ownership for LTC medicaid application.  CM met with patient at bedside and obtained the requested information and updated DSS medicaid worker, Randalyn Rhea, who reports she will review all components of  the application and update this CM on application status by tomorrow.    Expected Discharge Plan: Skilled Nursing Facility Barriers to Discharge: SNF Authorization Denied  Expected Discharge Plan and Services In-house Referral: NA Discharge Planning Services: CM Consult Post Acute Care Choice: [place] Living arrangements for the past 2 months: Apartment Expected Discharge Date: 12/07/22               DME Arranged: N/A DME Agency: NA       HH Arranged: NA HH Agency: NA         Social Determinants of Health (SDOH) Interventions SDOH Screenings   Food Insecurity: Unknown (12/04/2022)  Housing: Low Risk  (12/04/2022)  Transportation Needs: Unknown (12/04/2022)  Utilities: Unknown (12/04/2022)  Depression (PHQ2-9): High Risk (09/19/2022)  Tobacco Use: High Risk (12/30/2022)    Readmission Risk Interventions    12/17/2022    1:42 PM  Readmission Risk Prevention Plan  Transportation Screening Complete  PCP or Specialist Appt within 3-5 Days Complete  HRI or [place] Complete  Social Work Consult for [place] Planning/Counseling [place] Not Applicable  Medication Review [employer] photographer) Referral to Pharmacy

## 2023-01-07 NOTE — Progress Notes (Signed)
Physical Therapy Treatment Patient Details Name: Kara Bailey MRN: [id number] DOB: August 17, 1956 Today's Date: 01/07/2023   History of Present Illness Patient is 67 year old female admitted 11/29/22 with multifactorial dysphagia; s/p PEG tube 12/03/22; EGD 2/16.  Pt with hx of CVA with L hemiparesis, HTN, iliac stent, hep C, GERD    PT Comments    Pt assisted with set up and transfer to/from wheelchair.  Pt able to perform bed mobility with elevated HOB and rail.  Pt requiring min assist for lateral/scoot transfers with sliding board.    Recommendations for follow up therapy are one component of a multi-disciplinary discharge planning process, led by the attending physician.  Recommendations may be updated based on patient status, additional functional criteria and insurance authorization.  Follow Up Recommendations  Long-term institutional care without follow-up therapy Can patient physically be transported by private vehicle: No   Assistance Recommended at Discharge Frequent or constant Supervision/Assistance  Patient can return home with the following A little help with bathing/dressing/bathroom;Assistance with cooking/housework;Help with stairs or ramp for entrance;A lot of help with walking and/or transfers;Assist for transportation   Equipment Recommendations  Other (comment) (sliding board)    Recommendations for Other Services       Precautions / Restrictions Precautions Precautions: Fall Precaution Comments: hx of CVA last year with L sided weakness; L knee buckles     Mobility  Bed Mobility Overal bed mobility: Needs Assistance Bed Mobility: Supine to Sit     Supine to sit: Min guard, HOB elevated     General bed mobility comments: using stronger Rt side to come to EOB, required rail    Transfers Overall transfer level: Needs assistance Equipment used: Sliding board Transfers: Bed to chair/wheelchair/BSC            Lateral/Scoot Transfers: Min assist,  With slide board General transfer comment: pt pointing and engaging in setup for lateral/scoot transfer, therapist placed/removed sliding board and pt required min assist to scoot towards stronger right side into w/c and then back into bed    Ambulation/Gait                   Geneticist, molecular Details (indicate cue type and reason): pt not able to propel due to right hemiplegia but again educated on wheelchair parts; pt taken to look out hallway window which therapist pushed pt in w/c then returned to room  Modified Rankin (Stroke Patients Only)       Balance                                            Cognition Arousal/Alertness: Awake/alert Behavior During Therapy: WFL for tasks assessed/performed Overall Cognitive Status: Within Functional Limits for tasks assessed                                          Exercises      General Comments        Pertinent Vitals/Pain Pain Assessment Pain Assessment: No/denies pain    Home Living                          Prior Function  PT Goals (current goals can now be found in the care plan section) Progress towards PT goals: Progressing toward goals    Frequency    Min 2X/week      PT Plan Current plan remains appropriate    Co-evaluation              AM-PAC PT "6 Clicks" Mobility   Outcome Measure  Help needed turning from your back to your side while in a flat bed without using bedrails?: A Lot Help needed moving from lying on your back to sitting on the side of a flat bed without using bedrails?: A Lot Help needed moving to and from a bed to a chair (including a wheelchair)?: A Lot Help needed standing up from a chair using your arms (e.g., wheelchair or bedside chair)?: A Lot Help needed to walk in hospital room?: Total Help needed climbing 3-5 steps with a railing? :  Total 6 Click Score: 10    End of Session   Activity Tolerance: Patient tolerated treatment well Patient left: in bed;with call bell/phone within reach;with bed alarm set   PT Visit Diagnosis: Other abnormalities of gait and mobility (R26.89);Unsteadiness on feet (R26.81)     Time: [id number] PT Time Calculation (min) (ACUTE ONLY): 28 min  Charges:  $Therapeutic Activity: 8-22 mins                    Jannette Spanner PT, DPT Physical Therapist Acute Rehabilitation Services Preferred contact method: Secure Chat Weekend Pager Only: [phone] Office: [place] 01/07/2023, 3:32 PM

## 2023-01-07 NOTE — Progress Notes (Signed)
PROGRESS NOTE    Kara Bailey  [id number] DOB: [birth year] DOA: 11/29/2022 PCP: Andree Moro, DO     Brief Narrative:  Kara Bailey is a 67 year old female with history of CVA, hypertension presented the emergency department at the request of her PCP for the evaluation of persistent dysphagia.  Found to have multifactorial dysphagia likely from stroke.  Status post G-tube placement by IR.  PT/OT recommending SNF on discharge.  Developed nausea, vomiting, abdominal pain on 1/26,  CT abdomen/pelvis showed possible cholecystitis, MRCP confirmed acute cholecystitis.  General surgery was  following.  Significant improvement abdominal pain, nausea, vomiting, liver enzymes.  She completed antibiotic course.  Hospital course remarkable for persistent diarrhea, anemia.  FOBT positive.  GI consulted, underwent EGD on 2/16.  She has had prolonged hospitalization.  Her insurance declined SNF so daughter has appealed. For now appears that there is no payor source for SNF, unless family self-pays.   New events last 24 hours / Subjective: States that she had to ice creams yesterday and had abdominal pain.  Doing better this morning  Assessment & Plan:  Principal Problem:   Dysphagia as late effect of cerebrovascular accident (CVA) Active Problems:   Essential hypertension   Left hemiplegia (HCC)   History of stroke   Dysphagia   Protein-calorie malnutrition, severe   Decreased hemoglobin   Heme positive stool   Dysphagia  -Likely secondary to residual effect of the ischemic stroke.  Has history of esophageal stricture last dilated on 2021.  GI consulted. Barium swallow done on 11/30/2022 was nondiagnostic.   -Underwent G-tube placement by IR on 1/22    Abdomen pain, nausea, vomiting/acute cholecystitis   -Likely acalculous cholecystitis.  -CT abdomen/pelvis, MRCP confirmed acute cholecystitis without stones.   -Completed Augmentin course. -General surgery signed off.  Diarrhea   -Thought to be from antibiotics and also tube feeding.  We requested nutritionist to change the tube diet.   -GI pathogen panel negative.  CT abdomen/pelvis with contrast did not show any bowel inflammation or any other etiology to explain abdominal pain.  Tried to check C. Difficile but not approved by Dr Johnnye Sima.  Diarrhea has slowed down  -On Lomotil and probiotics   Normocytic anemia -Transfused with a unit of PRBC on 2/15 -FOBT checked and was positive.  Patient also mentioned that her diarrhea was black on 2/14.  Per GI- anemia is multifactorial.  External hemorrhoids noted on exam could be the cause of heme positive stool -EGD negative for any acute findings. Aspirin and Plavix resumed on 12/29/2022. -Protonix    History of ischemic stroke with dysarthria/dysphagia -Has a dense hemiplegia since September 2023.  On Plavix, aspirin, Lipitor, Zetia at home.  Also on baclofen and Zanaflex for left-sided hemiplegia and spasticity   Depression -Celexa   Debility/deconditioning -PT/OT recommending SNF on discharge. TOC following.  Insurance declined, family has appealed. No payor source. Prolonged hospitalization   In agreement with assessment of the pressure ulcer as below:  Pressure Injury 12/29/22 Sacrum Mid Stage 2 -  Partial thickness loss of dermis presenting as a shallow open injury with a red, pink wound bed without slough. (Active)  12/29/22 0830  Location: Sacrum  Location Orientation: Mid  Staging: Stage 2 -  Partial thickness loss of dermis presenting as a shallow open injury with a red, pink wound bed without slough.  Wound Description (Comments):   Present on Admission:   Dressing Type Foam - Lift dressing to assess site every shift 01/06/23 2030  Nutrition Problem: Severe Malnutrition Etiology: chronic illness, dysphagia (h/o CVA)   DVT prophylaxis:  SCDs Start: 11/30/22 0151  Code Status: Full Family Communication: None at bedside  Disposition Plan:   Status is: Inpatient Remains inpatient appropriate because: No payor source. Family will have to self-pay or take patient home. Per family, unable to care for her at home.   Antimicrobials:  Anti-infectives (From admission, onward)    Start     Dose/Rate Route Frequency Ordered Stop   12/11/22 2200  amoxicillin-clavulanate (AUGMENTIN) 400-57 MG/5ML suspension 875 mg        875 mg Per Tube Every 12 hours 12/11/22 1137 12/18/22 1039   12/11/22 1000  amoxicillin-clavulanate (AUGMENTIN) 875-125 MG per tablet 1 tablet  Status:  Discontinued        1 tablet Oral Every 12 hours 12/11/22 0821 12/11/22 1137   12/08/22 2000  piperacillin-tazobactam (ZOSYN) IVPB 3.375 g  Status:  Discontinued        3.375 g 12.5 mL/hr over 240 Minutes Intravenous Every 8 hours 12/08/22 1900 12/11/22 0821   12/07/22 1130  cefadroxil (DURICEF) capsule 500 mg  Status:  Discontinued        500 mg Oral 2 times daily 12/07/22 1033 12/07/22 1033   12/07/22 1130  cefadroxil (DURICEF) capsule 500 mg  Status:  Discontinued        500 mg Oral 2 times daily 12/07/22 1033 12/07/22 1034   12/07/22 1130  cefadroxil (DURICEF) capsule 500 mg  Status:  Discontinued        500 mg Per Tube 2 times daily 12/07/22 1034 12/08/22 1903   12/03/22 0700  ceFAZolin (ANCEF) IVPB 2g/100 mL premix        2 g 200 mL/hr over 30 Minutes Intravenous To Radiology 11/30/22 1602 12/04/22 0700        Objective: Vitals:   01/06/23 1418 01/06/23 2055 01/07/23 0445 01/07/23 1215  BP: 119/65 (!) 112/55 (!) 108/53 107/65  Pulse: 81 75 79 78  Resp: '17 17 17 16  '$ Temp: 97.8 F (36.6 C) 98.6 F (37 C) 98.6 F (37 C) 98.2 F (36.8 C)  TempSrc: Oral Oral Oral Oral  SpO2: 100% 97% 97% 100%  Weight:   53.3 kg   Height:        Intake/Output Summary (Last 24 hours) at 01/07/2023 1345 Last data filed at 01/06/2023 2358 Gross per 24 hour  Intake 600 ml  Output --  Net 600 ml    Filed Weights   01/04/23 0543 01/06/23 0551 01/07/23 0445  Weight:  51.8 kg 53.3 kg 53.3 kg    Examination:  General exam: Appears calm and comfortable   Data Reviewed: I have personally reviewed following labs and imaging studies  CBC: Recent Labs  Lab 01/01/23 0532  WBC 9.7  HGB 8.6*  HCT 27.7*  MCV 94.2  PLT 123XX123    Basic Metabolic Panel: Recent Labs  Lab 01/01/23 0532  NA 138  K 4.1  CL 103  CO2 26  GLUCOSE 115*  BUN 29*  CREATININE 0.68  CALCIUM 9.0  MG 2.4    GFR: Estimated Creatinine Clearance: 56.6 mL/min (by C-G formula based on SCr of 0.68 mg/dL). Liver Function Tests: No results for input(s): "AST", "ALT", "ALKPHOS", "BILITOT", "PROT", "ALBUMIN" in the last 168 hours. No results for input(s): "LIPASE", "AMYLASE" in the last 168 hours. No results for input(s): "AMMONIA" in the last 168 hours. Coagulation Profile: No results for input(s): "INR", "PROTIME" in the last 168 hours.  Cardiac Enzymes: No results for input(s): "CKTOTAL", "CKMB", "CKMBINDEX", "TROPONINI" in the last 168 hours. BNP (last 3 results) No results for input(s): "PROBNP" in the last 8760 hours. HbA1C: No results for input(s): "HGBA1C" in the last 72 hours. CBG: No results for input(s): "GLUCAP" in the last 168 hours. Lipid Profile: No results for input(s): "CHOL", "HDL", "LDLCALC", "TRIG", "CHOLHDL", "LDLDIRECT" in the last 72 hours. Thyroid Function Tests: No results for input(s): "TSH", "T4TOTAL", "FREET4", "T3FREE", "THYROIDAB" in the last 72 hours. Anemia Panel: No results for input(s): "VITAMINB12", "FOLATE", "FERRITIN", "TIBC", "IRON", "RETICCTPCT" in the last 72 hours. Sepsis Labs: No results for input(s): "PROCALCITON", "LATICACIDVEN" in the last 168 hours.  No results found for this or any previous visit (from the past 240 hour(s)).    Radiology Studies: No results found.    Scheduled Meds:  aspirin EC  81 mg Oral Daily   atorvastatin  80 mg Per Tube QPC supper   citalopram  10 mg Oral q morning   clopidogrel  75 mg Oral Daily    diclofenac Sodium  4 g Topical QID   diphenoxylate-atropine  2 tablet Per Tube QID   ezetimibe  10 mg Per Tube Daily   feeding supplement (JEVITY 1.5 CAL/FIBER)  1,000 mL Per Tube Q24H   feeding supplement (PROSource TF20)  60 mL Per Tube TID BM   free water  100 mL Per Tube Q4H   melatonin  3 mg Per Tube QHS   multivitamin with minerals  1 tablet Per Tube Daily   mouth rinse  15 mL Mouth Rinse 4 times per day   pantoprazole  40 mg Oral QAC breakfast   saccharomyces boulardii  250 mg Per Tube BID   thiamine  100 mg Per Tube Daily   tiZANidine  2 mg Per Tube BID   Continuous Infusions:   LOS: 37 days   Time spent: 10 minutes   Dessa Phi, DO [facility] 01/07/2023, 1:45 PM   Available via Epic secure chat 7am-7pm After these hours, please refer to coverage provider listed on [email or web address]

## 2023-01-07 NOTE — Anesthesia Postprocedure Evaluation (Signed)
Anesthesia Post Note  Patient: Kara Bailey  Procedure(s) Performed: ESOPHAGOGASTRODUODENOSCOPY (EGD) WITH PROPOFOL     Patient location during evaluation: PACU Anesthesia Type: MAC Level of consciousness: awake and alert Pain management: pain level controlled Vital Signs Assessment: post-procedure vital signs reviewed and stable Respiratory status: spontaneous breathing, nonlabored ventilation, respiratory function stable and patient connected to nasal cannula oxygen Cardiovascular status: stable and blood pressure returned to baseline Postop Assessment: no apparent nausea or vomiting Anesthetic complications: no   No notable events documented.  Last Vitals:  Vitals:   01/07/23 0445 01/07/23 1215  BP: (!) 108/53 107/65  Pulse: 79 78  Resp: 17 16  Temp: 37 C 36.8 C  SpO2: 97% 100%    Last Pain:  Vitals:   01/07/23 1355  TempSrc:   PainSc: Asleep                 Belenda Cruise P Griffin Gerrard

## 2023-01-07 NOTE — TOC Progression Note (Signed)
Transition of Care [facility]) - Progression Note    Patient Details  Name: TYONIA DARTY MRN: [id number] Date of Birth: 10-21-1956  Transition of [name]) CM/SW Contact  Joaquin Courts, RN Phone Number: 01/07/2023, 10:35 AM  Clinical Narrative:     CM left VM for DSS medicaid worker requesting follow up call.  Expected Discharge Plan: Skilled Nursing Facility Barriers to Discharge: SNF Authorization Denied  Expected Discharge Plan and Services In-house Referral: NA Discharge Planning Services: CM Consult Post Acute Care Choice: [place] Living arrangements for the past 2 months: Apartment Expected Discharge Date: 12/07/22               DME Arranged: N/A DME Agency: NA       HH Arranged: NA HH Agency: NA         Social Determinants of Health (SDOH) Interventions SDOH Screenings   Food Insecurity: Unknown (12/04/2022)  Housing: Low Risk  (12/04/2022)  Transportation Needs: Unknown (12/04/2022)  Utilities: Unknown (12/04/2022)  Depression (PHQ2-9): High Risk (09/19/2022)  Tobacco Use: High Risk (12/30/2022)    Readmission Risk Interventions    12/17/2022    1:42 PM  Readmission Risk Prevention Plan  Transportation Screening Complete  PCP or Specialist Appt within 3-5 Days Complete  HRI or [place] Complete  Social Work Consult for [place] Planning/Counseling [place] Not Applicable  Medication Review [employer]) Referral to Pharmacy

## 2023-01-08 DIAGNOSIS — I69391 Dysphagia following cerebral infarction: Secondary | ICD-10-CM | POA: Diagnosis not present

## 2023-01-08 MED ORDER — ASPIRIN 81 MG PO CHEW
81.0000 mg | CHEWABLE_TABLET | Freq: Every day | ORAL | Status: DC
  Administered 2023-01-09 – 2023-01-14 (×6): 81 mg
  Filled 2023-01-08 (×6): qty 1

## 2023-01-08 MED ORDER — FAMOTIDINE 40 MG/5ML PO SUSR
10.0000 mg | Freq: Every day | ORAL | Status: DC
  Administered 2023-01-09 – 2023-01-13 (×5): 10.4 mg
  Filled 2023-01-08 (×6): qty 2.5

## 2023-01-08 MED ORDER — CITALOPRAM HYDROBROMIDE 10 MG PO TABS
10.0000 mg | ORAL_TABLET | Freq: Every morning | ORAL | Status: DC
  Administered 2023-01-09 – 2023-01-14 (×6): 10 mg
  Filled 2023-01-08 (×6): qty 1

## 2023-01-08 MED ORDER — CLOPIDOGREL BISULFATE 75 MG PO TABS
75.0000 mg | ORAL_TABLET | Freq: Every day | ORAL | Status: DC
  Administered 2023-01-09 – 2023-01-14 (×6): 75 mg
  Filled 2023-01-08 (×6): qty 1

## 2023-01-08 NOTE — Progress Notes (Signed)
PROGRESS NOTE    Kara Bailey  [id number] DOB: February 22, 1956 DOA: 11/29/2022 PCP: Andree Moro, DO     Brief Narrative:  Kara Bailey is a 67 year old female with history of CVA, hypertension presented the emergency department at the request of her PCP for the evaluation of persistent dysphagia.  Found to have multifactorial dysphagia likely from stroke.  Status post G-tube placement by IR.  PT/OT recommending SNF on discharge.  Developed nausea, vomiting, abdominal pain on 1/26,  CT abdomen/pelvis showed possible cholecystitis, MRCP confirmed acute cholecystitis.  General surgery was  following.  Significant improvement abdominal pain, nausea, vomiting, liver enzymes.  She completed antibiotic course.  Hospital course remarkable for persistent diarrhea, anemia.  FOBT positive.  GI consulted, underwent EGD on 2/16.  She has had prolonged hospitalization.  Her insurance declined SNF so daughter has appealed. For now appears that there is no payor source for SNF, unless family self-pays.   New events last 24 hours / Subjective: No change or new events   Assessment & Plan:  Principal Problem:   Dysphagia as late effect of cerebrovascular accident (CVA) Active Problems:   Essential hypertension   Left hemiplegia (HCC)   History of stroke   Dysphagia   Protein-calorie malnutrition, severe   Decreased hemoglobin   Heme positive stool   Dysphagia  -Likely secondary to residual effect of the ischemic stroke.  Has history of esophageal stricture last dilated on 2021.  GI consulted. Barium swallow done on 11/30/2022 was nondiagnostic.   -Underwent G-tube placement by IR on 1/22    Abdomen pain, nausea, vomiting/acute cholecystitis   -Likely acalculous cholecystitis.  -CT abdomen/pelvis, MRCP confirmed acute cholecystitis without stones.   -Completed Augmentin course. -General surgery signed off.  Diarrhea  -Thought to be from antibiotics and also tube feeding.  We requested  nutritionist to change the tube diet.   -GI pathogen panel negative.  CT abdomen/pelvis with contrast did not show any bowel inflammation or any other etiology to explain abdominal pain.  Tried to check C. Difficile but not approved by Dr Johnnye Sima.  Diarrhea has slowed down  -On Lomotil and probiotics   Normocytic anemia -Transfused with a unit of PRBC on 2/15 -FOBT checked and was positive.  Patient also mentioned that her diarrhea was black on 2/14.  Per GI- anemia is multifactorial.  External hemorrhoids noted on exam could be the cause of heme positive stool -EGD negative for any acute findings. Aspirin and Plavix resumed on 12/29/2022. -Protonix    History of ischemic stroke with dysarthria/dysphagia -Has a dense hemiplegia since September 2023.  On Plavix, aspirin, Lipitor, Zetia at home.  Also on baclofen and Zanaflex for left-sided hemiplegia and spasticity   Depression -Celexa   Debility/deconditioning -PT/OT recommending SNF on discharge. TOC following.  Insurance declined, family has appealed. No payor source. Prolonged hospitalization   In agreement with assessment of the pressure ulcer as below:  Pressure Injury 12/29/22 Sacrum Mid Stage 2 -  Partial thickness loss of dermis presenting as a shallow open injury with a red, pink wound bed without slough. (Active)  12/29/22 0830  Location: Sacrum  Location Orientation: Mid  Staging: Stage 2 -  Partial thickness loss of dermis presenting as a shallow open injury with a red, pink wound bed without slough.  Wound Description (Comments):   Present on Admission:   Dressing Type Foam - Lift dressing to assess site every shift 01/06/23 2030     Nutrition Problem: Severe Malnutrition Etiology: chronic illness,  dysphagia (h/o CVA)   DVT prophylaxis:  SCDs Start: 11/30/22 0151  Code Status: Full Family Communication: None at bedside  Disposition Plan:  Status is: Inpatient Remains inpatient appropriate because: No payor  source. Family will have to self-pay or take patient home. Per family, unable to care for her at home.   Antimicrobials:  Anti-infectives (From admission, onward)    Start     Dose/Rate Route Frequency Ordered Stop   12/11/22 2200  amoxicillin-clavulanate (AUGMENTIN) 400-57 MG/5ML suspension 875 mg        875 mg Per Tube Every 12 hours 12/11/22 1137 12/18/22 1039   12/11/22 1000  amoxicillin-clavulanate (AUGMENTIN) 875-125 MG per tablet 1 tablet  Status:  Discontinued        1 tablet Oral Every 12 hours 12/11/22 0821 12/11/22 1137   12/08/22 2000  piperacillin-tazobactam (ZOSYN) IVPB 3.375 g  Status:  Discontinued        3.375 g 12.5 mL/hr over 240 Minutes Intravenous Every 8 hours 12/08/22 1900 12/11/22 0821   12/07/22 1130  cefadroxil (DURICEF) capsule 500 mg  Status:  Discontinued        500 mg Oral 2 times daily 12/07/22 1033 12/07/22 1033   12/07/22 1130  cefadroxil (DURICEF) capsule 500 mg  Status:  Discontinued        500 mg Oral 2 times daily 12/07/22 1033 12/07/22 1034   12/07/22 1130  cefadroxil (DURICEF) capsule 500 mg  Status:  Discontinued        500 mg Per Tube 2 times daily 12/07/22 1034 12/08/22 1903   12/03/22 0700  ceFAZolin (ANCEF) IVPB 2g/100 mL premix        2 g 200 mL/hr over 30 Minutes Intravenous To Radiology 11/30/22 1602 12/04/22 0700        Objective: Vitals:   01/07/23 0445 01/07/23 1215 01/07/23 2138 01/08/23 0452  BP: (!) 108/53 107/65 (!) 124/59 118/71  Pulse: 79 78 79 83  Resp: '17 16 14 14  '$ Temp: 98.6 F (37 C) 98.2 F (36.8 C) 98.3 F (36.8 C) 98.2 F (36.8 C)  TempSrc: Oral Oral Oral Oral  SpO2: 97% 100% 100% 100%  Weight: 53.3 kg   52 kg  Height:        Intake/Output Summary (Last 24 hours) at 01/08/2023 1056 Last data filed at 01/08/2023 0522 Gross per 24 hour  Intake --  Output 600 ml  Net -600 ml    Filed Weights   01/06/23 0551 01/07/23 0445 01/08/23 0452  Weight: 53.3 kg 53.3 kg 52 kg    Examination:  General exam:  Appears calm and comfortable   Data Reviewed: I have personally reviewed following labs and imaging studies  CBC: No results for input(s): "WBC", "NEUTROABS", "HGB", "HCT", "MCV", "PLT" in the last 168 hours.  Basic Metabolic Panel: No results for input(s): "NA", "K", "CL", "CO2", "GLUCOSE", "BUN", "CREATININE", "CALCIUM", "MG", "PHOS" in the last 168 hours.  GFR: Estimated Creatinine Clearance: 55.3 mL/min (by C-G formula based on SCr of 0.68 mg/dL). Liver Function Tests: No results for input(s): "AST", "ALT", "ALKPHOS", "BILITOT", "PROT", "ALBUMIN" in the last 168 hours. No results for input(s): "LIPASE", "AMYLASE" in the last 168 hours. No results for input(s): "AMMONIA" in the last 168 hours. Coagulation Profile: No results for input(s): "INR", "PROTIME" in the last 168 hours. Cardiac Enzymes: No results for input(s): "CKTOTAL", "CKMB", "CKMBINDEX", "TROPONINI" in the last 168 hours. BNP (last 3 results) No results for input(s): "PROBNP" in the last 8760 hours. HbA1C:  No results for input(s): "HGBA1C" in the last 72 hours. CBG: No results for input(s): "GLUCAP" in the last 168 hours. Lipid Profile: No results for input(s): "CHOL", "HDL", "LDLCALC", "TRIG", "CHOLHDL", "LDLDIRECT" in the last 72 hours. Thyroid Function Tests: No results for input(s): "TSH", "T4TOTAL", "FREET4", "T3FREE", "THYROIDAB" in the last 72 hours. Anemia Panel: No results for input(s): "VITAMINB12", "FOLATE", "FERRITIN", "TIBC", "IRON", "RETICCTPCT" in the last 72 hours. Sepsis Labs: No results for input(s): "PROCALCITON", "LATICACIDVEN" in the last 168 hours.  No results found for this or any previous visit (from the past 240 hour(s)).    Radiology Studies: No results found.    Scheduled Meds:  aspirin EC  81 mg Oral Daily   atorvastatin  80 mg Per Tube QPC supper   citalopram  10 mg Oral q morning   clopidogrel  75 mg Oral Daily   diclofenac Sodium  4 g Topical QID   diphenoxylate-atropine   2 tablet Per Tube QID   ezetimibe  10 mg Per Tube Daily   feeding supplement (JEVITY 1.5 CAL/FIBER)  1,000 mL Per Tube Q24H   feeding supplement (PROSource TF20)  60 mL Per Tube TID BM   free water  100 mL Per Tube Q4H   melatonin  3 mg Per Tube QHS   multivitamin with minerals  1 tablet Per Tube Daily   mouth rinse  15 mL Mouth Rinse 4 times per day   pantoprazole  40 mg Oral QAC breakfast   saccharomyces boulardii  250 mg Per Tube BID   thiamine  100 mg Per Tube Daily   tiZANidine  2 mg Per Tube BID   Continuous Infusions:   LOS: 38 days   Time spent: 10 minutes   Dessa Phi, DO [facility] 01/08/2023, 10:56 AM   Available via Epic secure chat 7am-7pm After these hours, please refer to coverage provider listed on [email or web address]

## 2023-01-08 NOTE — Progress Notes (Signed)
Speech Language Pathology Treatment: Dysphagia  Patient Details Name: MADESYN PANGELINAN MRN: [id number] DOB: 1956-08-02 Today's Date: 01/08/2023 Time: [id number] SLP Time Calculation (min) (ACUTE ONLY): 25 min  Assessment / Plan / Recommendation Clinical Impression  Pt seen today to start Respiratory Muscle Strength Training with Respironics PEP device - clinically set at 8 cmH20 pressure. Pt able to perform 10 repetitions with moderate verbal/visual cues for adequate effort. She reports work load to be approx 5 or 6/10 - - Pt denies discomfort with its use. Advised she perform TID, x10 each time - using teach back.   Pt's speech is clear when she makes great effort to slow her rate and speak loudly. Hopeful for phonatory strength improvement with her RMST. Pt making great effort today.   Observed pt with po trials including Yogurt with reeces chunks and reeces candy bar. Oral manipulation and transiting is improving with efficiency - and pt using thin to aid oral clearance. Minimal anterior spill of liquid and yogurt noted. No indication of aspiration with all po - Pt consumed all but outside edge of Reeses and approx 1/3 of yogurt. She reports she likes fruit flavored yogurt, thus asked RN to order her fruit yogurt with every meal. SLP to continue efforts as pt is motivated. She is happy that Stanton Kidney (NT) is working with her today.     HPI HPI: Anjenette Jagoe is a 67 y.o. female who presented to [facility] ED on 11/29/22 with worsening dysphagia c/b inability to swallow pills or solid foods. She was sent to ED by PCP for PEG placement (PEG 12/03/22). PMHx includes CVAs, dysarthria,  first stroke seen in [facility] is 2022, HTN, prior epigastric hernia repair, iliac stent. Pt has a long hx of dysphagia and has worked with SLP in acute, inpatient, and OP settings over two years. She has had multiple MBS studies. 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  Pt's dysphagia has been primarily oral (difficulty with onset of oral phase and oral control) but pharyngeal function has been largely intact with good bolus clearance and adequate airway protection. Review of 11/30/22 esophagram reveals similar mechanics of swallow. 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 SLP has been seeing pt for dysphagia/dysarthria treatment.      SLP Plan  Continue with current plan of care      Recommendations for follow up therapy are one component of a multi-disciplinary discharge planning process, led by the attending physician.  Recommendations may be updated based on patient status, additional functional criteria and insurance authorization.    Recommendations  Diet recommendations: Dysphagia 1 (puree);Thin liquid Liquids provided via: Straw Medication Administration: Via alternative means Supervision: Patient able to self feed;Intermittent supervision to cue for compensatory strategies Compensations: Slow rate;Small sips/bites;Other (Comment);Lingual sweep for clearance of pocketing Postural Changes and/or Swallow Maneuvers: Seated upright 90 degrees;Upright 30-60 min after meal                Oral Care Recommendations: Oral care BID Follow Up Recommendations: Skilled nursing-short term rehab (<3 hours/day) Assistance recommended at discharge: Intermittent Supervision/Assistance SLP Visit Diagnosis: Dysphagia, oral phase (R13.11) Plan: Continue with current plan of care         Kathleen Lime, MS [place]   Macario Golds  01/08/2023, 7:23 PM

## 2023-01-08 NOTE — TOC Progression Note (Signed)
Transition of Care [facility]) - Progression Note    Patient Details  Name: Kara Bailey MRN: [id number] Date of Birth: Mar 06, 1956  Transition of Care [facility]) CM/SW Contact  Joaquin Courts, RN Phone Number: 01/08/2023, 4:02 PM  Clinical Narrative:    CM attempted to reach DSS CSWx2, left HIPAA complaint VM requesting call back.  Vm also left for [facility] admissions rep.   Expected Discharge Plan: Skilled Nursing Facility Barriers to Discharge: SNF Authorization Denied  Expected Discharge Plan and Services In-house Referral: NA Discharge Planning Services: CM Consult Post Acute Care Choice: [place] Living arrangements for the past 2 months: Apartment Expected Discharge Date: 12/07/22               DME Arranged: N/A DME Agency: NA       HH Arranged: NA HH Agency: NA         Social Determinants of Health (SDOH) Interventions SDOH Screenings   Food Insecurity: Unknown (12/04/2022)  Housing: Low Risk  (12/04/2022)  Transportation Needs: Unknown (12/04/2022)  Utilities: Unknown (12/04/2022)  Depression (PHQ2-9): High Risk (09/19/2022)  Tobacco Use: High Risk (12/30/2022)    Readmission Risk Interventions    12/17/2022    1:42 PM  Readmission Risk Prevention Plan  Transportation Screening Complete  PCP or Specialist Appt within 3-5 Days Complete  HRI or [place] Complete  Social Work Consult for [place] Planning/Counseling [place] Not Applicable  Medication Review Press photographer) Referral to Pharmacy

## 2023-01-09 DIAGNOSIS — I69391 Dysphagia following cerebral infarction: Secondary | ICD-10-CM | POA: Diagnosis not present

## 2023-01-09 NOTE — Progress Notes (Signed)
Physical Therapy Treatment Patient Details Name: Kara Bailey MRN: [id number] DOB: September 20, 1956 Today's Date: 01/09/2023   History of Present Illness Patient is 67 year old female admitted 11/29/22 with multifactorial dysphagia; s/p PEG tube 12/03/22; EGD 2/16.  Pt with hx of CVA with L hemiparesis, HTN, iliac stent, hep C, GERD    PT Comments    Pt tearful regarding transfer to new floor, agreeable to therapy. Pt pulls on bedrail to upright trunk into sitting, able to self assist BLE to EOB with increased time and min guard. Slideboard transfer training from bed to w/c going to strong R side, therapist managing slideboard placement, able to scoot with min A and cues for weight shift. Once in w/c pt's food tray arrives, requests to get into recliner to eat, stand pivot to L side, mod A with therapist positioned anterior to pt. Pt becomes tearful regarding unfamiliar staff and requests therapist transfer her back to bed; therapist brought NT in room and demo'd stand pivot transfers to improve pt's anxiousness and comfort level with new nursing staff. Notified Network engineer of pt's need for a clock in the room.   Recommendations for follow up therapy are one component of a multi-disciplinary discharge planning process, led by the attending physician.  Recommendations may be updated based on patient status, additional functional criteria and insurance authorization.  Follow Up Recommendations  Long-term institutional care without follow-up therapy Can patient physically be transported by private vehicle: No   Assistance Recommended at Discharge Frequent or constant Supervision/Assistance  Patient can return home with the following A little help with bathing/dressing/bathroom;Assistance with cooking/housework;Help with stairs or ramp for entrance;A lot of help with walking and/or transfers;Assist for transportation   Equipment Recommendations  Other (comment) (sliding board)    Recommendations for  Other Services       Precautions / Restrictions Precautions Precautions: Fall Precaution Comments: hx of CVA last year with L sided weakness; L knee buckles Restrictions Weight Bearing Restrictions: No     Mobility  Bed Mobility Overal bed mobility: Needs Assistance Bed Mobility: Supine to Sit, Sit to Supine  Supine to sit: Min guard Sit to supine: Min assist  General bed mobility comments: pt pulling on bedrail to upright trunk into sitting, min guard with therapist managing line for safety; min A to lift BLE back into bed and reposition to comfort    Transfers Overall transfer level: Needs assistance Equipment used: Sliding board, 1 person hand held assist Transfers: Bed to chair/wheelchair/BSC, Sit to/from Stand Sit to Stand: Mod assist Stand pivot transfers: Mod assist   Lateral/Scoot Transfers: Min assist, With slide board General transfer comment: bed to w/c slideboard transfer, therapist managing slideboard, min A to scoot to strong R side; stand pivot w/c to recliner then recliner to bed with mod A to power up to stand and pivot to seated surface, therapist in bear hug position    Ambulation/Gait    Stairs    Wheelchair Mobility    Modified Rankin (Stroke Patients Only)       Balance Overall balance assessment: Needs assistance Sitting-balance support: No upper extremity supported, Feet supported Sitting balance-Leahy Scale: Good Sitting balance - Comments: static sitting  Standing balance support: Single extremity supported Standing balance-Leahy Scale: Poor Standing balance comment: RUE support and assist from therapist     Cognition Arousal/Alertness: Awake/alert Behavior During Therapy: WFL for tasks assessed/performed Overall Cognitive Status: Within Functional Limits for tasks assessed  General Comments: pt tearful regarding transfer to new floor and meeting  new nursing staff, wanting therapist to demo transfers        Exercises       General Comments        Pertinent Vitals/Pain Pain Assessment Pain Assessment: No/denies pain    Home Living                          Prior Function            PT Goals (current goals can now be found in the care plan section) Acute Rehab PT Goals PT Goal Formulation: With patient Time For Goal Achievement: 01/18/23 Potential to Achieve Goals: Good Progress towards PT goals: Progressing toward goals    Frequency    Min 2X/week      PT Plan Current plan remains appropriate    Co-evaluation              AM-PAC PT "6 Clicks" Mobility   Outcome Measure  Help needed turning from your back to your side while in a flat bed without using bedrails?: A Lot Help needed moving from lying on your back to sitting on the side of a flat bed without using bedrails?: A Lot Help needed moving to and from a bed to a chair (including a wheelchair)?: A Lot Help needed standing up from a chair using your arms (e.g., wheelchair or bedside chair)?: A Lot Help needed to walk in hospital room?: Total Help needed climbing 3-5 steps with a railing? : Total 6 Click Score: 10    End of Session Equipment Utilized During Treatment: Gait belt Activity Tolerance: Patient tolerated treatment well Patient left: in bed;with call bell/phone within reach;with bed alarm set Nurse Communication: Mobility status (demo transfers with NT, lunch tray, clock not in room) PT Visit Diagnosis: Other abnormalities of gait and mobility (R26.89);Unsteadiness on feet (R26.81)     Time: [id number] PT Time Calculation (min) (ACUTE ONLY): 46 min  Charges:  $Therapeutic Activity: 38-52 mins                     Tori Levelle Edelen PT, DPT 01/09/23, 2:42 PM

## 2023-01-09 NOTE — Progress Notes (Signed)
PROGRESS NOTE    Kara Bailey  [id number] DOB: 1956/04/16 DOA: 11/29/2022 PCP: Andree Moro, DO     Brief Narrative:  MALAISHA Bailey is a 67 year old female with history of CVA, hypertension presented the emergency department at the request of her PCP for the evaluation of persistent dysphagia.  Found to have multifactorial dysphagia likely from stroke.  Status post G-tube placement by IR.  Developed nausea, vomiting, abdominal pain on 1/26,  CT abdomen/pelvis showed possible cholecystitis, MRCP confirmed acute cholecystitis.  General surgery was  following.  Significant improvement abdominal pain, nausea, vomiting, liver enzymes.  She completed antibiotic course.  Hospital course remarkable for persistent diarrhea, anemia.  FOBT positive.  GI consulted, underwent EGD on 2/16.  She has had prolonged hospitalization.  Family cannot take patient home.  She needs a long-term nursing home and currently remains as custodial care.    New events last 24 hours / Subjective:  No new events.  She was happy to be able to eat some liquid diet.  Assessment & Plan:  Principal Problem:   Dysphagia as late effect of cerebrovascular accident (CVA) Active Problems:   Essential hypertension   Left hemiplegia (HCC)   History of stroke   Dysphagia   Protein-calorie malnutrition, severe   Decreased hemoglobin   Heme positive stool   Dysphagia  -Likely secondary to residual effect of the ischemic stroke.  Has history of esophageal stricture last dilated on 2021.  GI consulted. Barium swallow done on 11/30/2022 was nondiagnostic.   -Underwent G-tube placement by IR on 1/22.  Tolerating oral intake.   Abdomen pain, nausea, vomiting/acute cholecystitis   -Likely acalculous cholecystitis.  -CT abdomen/pelvis, MRCP confirmed acute cholecystitis without stones.   -Completed Augmentin course. -General surgery signed off.  Diarrhea  --Improved.   Normocytic anemia -Transfused with a unit of  PRBC on 2/15 -FOBT checked and was positive.  Patient also mentioned that her diarrhea was black on 2/14.  Per GI- anemia is multifactorial.  External hemorrhoids noted on exam could be the cause of heme positive stool -EGD negative for any acute findings. Aspirin and Plavix resumed on 12/29/2022. -Protonix    History of ischemic stroke with dysarthria/dysphagia -Has a dense hemiplegia since September 2023.  On Plavix, aspirin, Lipitor, Zetia at home.  Also on baclofen and Zanaflex for left-sided hemiplegia and spasticity   Depression -Celexa   Debility/deconditioning TOC following.  Insurance declined, family has appealed. No payor source. Prolonged hospitalization   In agreement with assessment of the pressure ulcer as below:  Pressure Injury 12/29/22 Sacrum Mid Stage 2 -  Partial thickness loss of dermis presenting as a shallow open injury with a red, pink wound bed without slough. (Active)  12/29/22 0830  Location: Sacrum  Location Orientation: Mid  Staging: Stage 2 -  Partial thickness loss of dermis presenting as a shallow open injury with a red, pink wound bed without slough.  Wound Description (Comments):   Present on Admission:   Dressing Type Foam - Lift dressing to assess site every shift 01/08/23 0800     Nutrition Problem: Severe Malnutrition Etiology: chronic illness, dysphagia (h/o CVA)   DVT prophylaxis:  SCDs Start: 11/30/22 0151  Code Status: Full Family Communication: None at bedside  Disposition Plan:  Status is: Inpatient Remains inpatient appropriate because: No payor source. Family will have to self-pay or take patient home. Per family, unable to care for her at home.   Antimicrobials:  Anti-infectives (From admission, onward)    Start  Dose/Rate Route Frequency Ordered Stop   12/11/22 2200  amoxicillin-clavulanate (AUGMENTIN) 400-57 MG/5ML suspension 875 mg        875 mg Per Tube Every 12 hours 12/11/22 1137 12/18/22 1039   12/11/22 1000   amoxicillin-clavulanate (AUGMENTIN) 875-125 MG per tablet 1 tablet  Status:  Discontinued        1 tablet Oral Every 12 hours 12/11/22 0821 12/11/22 1137   12/08/22 2000  piperacillin-tazobactam (ZOSYN) IVPB 3.375 g  Status:  Discontinued        3.375 g 12.5 mL/hr over 240 Minutes Intravenous Every 8 hours 12/08/22 1900 12/11/22 0821   12/07/22 1130  cefadroxil (DURICEF) capsule 500 mg  Status:  Discontinued        500 mg Oral 2 times daily 12/07/22 1033 12/07/22 1033   12/07/22 1130  cefadroxil (DURICEF) capsule 500 mg  Status:  Discontinued        500 mg Oral 2 times daily 12/07/22 1033 12/07/22 1034   12/07/22 1130  cefadroxil (DURICEF) capsule 500 mg  Status:  Discontinued        500 mg Per Tube 2 times daily 12/07/22 1034 12/08/22 1903   12/03/22 0700  ceFAZolin (ANCEF) IVPB 2g/100 mL premix        2 g 200 mL/hr over 30 Minutes Intravenous To Radiology 11/30/22 1602 12/04/22 0700        Objective: Vitals:   01/08/23 1530 01/08/23 2112 01/09/23 0534 01/09/23 1112  BP: 132/63 118/89 (!) 116/59 (!) 109/54  Pulse: 77 80 80 78  Resp: '16 18 18 16  '$ Temp: 98.4 F (36.9 C) 98.5 F (36.9 C) 98.1 F (36.7 C) 98.6 F (37 C)  TempSrc: Oral Oral Oral   SpO2: 97% 97% 98% 98%  Weight:      Height:        Intake/Output Summary (Last 24 hours) at 01/09/2023 1136 Last data filed at 01/08/2023 1241 Gross per 24 hour  Intake 117 ml  Output --  Net 117 ml    Filed Weights   01/06/23 0551 01/07/23 0445 01/08/23 0452  Weight: 53.3 kg 53.3 kg 52 kg    Examination:   Appears calm and comfortable.  Able to keep up conversation.  Tube feeding infusing.   Data Reviewed: I have personally reviewed following labs and imaging studies  CBC: No results for input(s): "WBC", "NEUTROABS", "HGB", "HCT", "MCV", "PLT" in the last 168 hours.  Basic Metabolic Panel: No results for input(s): "NA", "K", "CL", "CO2", "GLUCOSE", "BUN", "CREATININE", "CALCIUM", "MG", "PHOS" in the last 168  hours.  GFR: Estimated Creatinine Clearance: 55.3 mL/min (by C-G formula based on SCr of 0.68 mg/dL). Liver Function Tests: No results for input(s): "AST", "ALT", "ALKPHOS", "BILITOT", "PROT", "ALBUMIN" in the last 168 hours. No results for input(s): "LIPASE", "AMYLASE" in the last 168 hours. No results for input(s): "AMMONIA" in the last 168 hours. Coagulation Profile: No results for input(s): "INR", "PROTIME" in the last 168 hours. Cardiac Enzymes: No results for input(s): "CKTOTAL", "CKMB", "CKMBINDEX", "TROPONINI" in the last 168 hours. BNP (last 3 results) No results for input(s): "PROBNP" in the last 8760 hours. HbA1C: No results for input(s): "HGBA1C" in the last 72 hours. CBG: No results for input(s): "GLUCAP" in the last 168 hours. Lipid Profile: No results for input(s): "CHOL", "HDL", "LDLCALC", "TRIG", "CHOLHDL", "LDLDIRECT" in the last 72 hours. Thyroid Function Tests: No results for input(s): "TSH", "T4TOTAL", "FREET4", "T3FREE", "THYROIDAB" in the last 72 hours. Anemia Panel: No results for input(s): "VITAMINB12", "  FOLATE", "FERRITIN", "TIBC", "IRON", "RETICCTPCT" in the last 72 hours. Sepsis Labs: No results for input(s): "PROCALCITON", "LATICACIDVEN" in the last 168 hours.  No results found for this or any previous visit (from the past 240 hour(s)).    Radiology Studies: No results found.    Scheduled Meds:  aspirin  81 mg Per Tube Daily   atorvastatin  80 mg Per Tube QPC supper   citalopram  10 mg Per Tube q morning   clopidogrel  75 mg Per Tube Daily   diclofenac Sodium  4 g Topical QID   diphenoxylate-atropine  2 tablet Per Tube QID   ezetimibe  10 mg Per Tube Daily   famotidine  10.4 mg Per Tube QHS   feeding supplement (JEVITY 1.5 CAL/FIBER)  1,000 mL Per Tube Q24H   feeding supplement (PROSource TF20)  60 mL Per Tube TID BM   free water  100 mL Per Tube Q4H   melatonin  3 mg Per Tube QHS   multivitamin with minerals  1 tablet Per Tube Daily    mouth rinse  15 mL Mouth Rinse 4 times per day   saccharomyces boulardii  250 mg Per Tube BID   thiamine  100 mg Per Tube Daily   tiZANidine  2 mg Per Tube BID   Continuous Infusions:   LOS: 39 days   Time spent: 25 minutes.

## 2023-01-09 NOTE — TOC Progression Note (Addendum)
Transition of Care [facility]) - Progression Note    Patient Details  Name: Kara Bailey MRN: [id number] Date of Birth: 12/14/1955  Transition [facility]) CM/SW Contact  Joaquin Courts, RN Phone Number: 01/09/2023, 1:39 PM  Clinical Narrative:    CM received notification via secure email that patient's medicaid application "looks good" per DSS medicaid worker.  This information was forwarded to [facility] admissions rep for reconsideration for bed offer.  Addendum 1537: [facility] has declined to make a bed offer on this patient.  Patient remains without bed offers at this time.   Expected Discharge Plan: Skilled Nursing Facility Barriers to Discharge: SNF Authorization Denied  Expected Discharge Plan and Services In-house Referral: NA Discharge Planning Services: CM Consult Post Acute Care Choice: [place] Living arrangements for the past 2 months: Apartment Expected Discharge Date: 12/07/22               DME Arranged: N/A DME Agency: NA       HH Arranged: NA HH Agency: NA         Social Determinants of Health (SDOH) Interventions SDOH Screenings   Food Insecurity: Unknown (12/04/2022)  Housing: Low Risk  (12/04/2022)  Transportation Needs: Unknown (12/04/2022)  Utilities: Unknown (12/04/2022)  Depression (PHQ2-9): High Risk (09/19/2022)  Tobacco Use: High Risk (12/30/2022)    Readmission Risk Interventions    12/17/2022    1:42 PM  Readmission Risk Prevention Plan  Transportation Screening Complete  PCP or Specialist Appt within 3-5 Days Complete  HRI or [place] Complete  Social Work Consult for [place] Planning/Counseling [place] Not Applicable  Medication Review Press photographer) Referral to Pharmacy

## 2023-01-09 NOTE — Progress Notes (Signed)
Speech Language Pathology Treatment: Dysphagia  Patient Details Name: CELENE KROH MRN: [id number] DOB: 1955-11-26 Today's Date: 01/09/2023 Time: [id number] SLP Time Calculation (min) (ACUTE ONLY): 26 min  Assessment / Plan / Recommendation Clinical Impression  Today pt seen for dysphagia/dyasthria treatment. Pt reports she did not eat breakfast - stating "I don't eat breakfast". RMST conducted x2 (10 reps) per pt even prior to this session. Pt agreeable to conduct RMST with this SLP benefiting from moderate verbal cues to maximize effort. SLP also provided her with notebook to use for frequent communication phrases to use if she is frustrated and to keep track of her RMST completion. Advised she conduct one more set today- 3 sets tomorrow. Provided butter for pt to use in her foods and left note that it is appropriate to be left out of the fridge. Pt is making progress but will need encouragement and mod cues to assure adequate effort. Recommended pt write down frequently stated words and practice repeatedly to improve intelligibility.    HPI HPI: Elynn Bertch is a 67 y.o. female who presented to [facility] ED on 11/29/22 with worsening dysphagia c/b inability to swallow pills or solid foods. She was sent to ED by PCP for PEG placement (PEG 12/03/22). PMHx includes CVAs, dysarthria,  [facility] stroke [facility] is 2022, HTN, prior epigastric hernia repair, iliac stent. Pt has a long hx of dysphagia and has worked with SLP in acute, inpatient, and OP settings over two years. She has had multiple MBS studies. 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  Pt's dysphagia has been primarily oral (difficulty with onset of oral phase and oral control) but pharyngeal function has been largely intact with good bolus clearance and adequate airway protection.  Review of 11/30/22 esophagram reveals similar mechanics of swallow. 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 SLP has been seeing pt for dysphagia/dysarthria treatment.      SLP Plan  Continue with current plan of care      Recommendations for follow up therapy are one component of a multi-disciplinary discharge planning process, led by the attending physician.  Recommendations may be updated based on patient status, additional functional criteria and insurance authorization.    Recommendations  Diet recommendations: Dysphagia 1 (puree);Thin liquid Liquids provided via: Straw Medication Administration: Via alternative means Supervision: Patient able to self feed;Intermittent supervision to cue for compensatory strategies Compensations: Slow rate;Small sips/bites;Other (Comment);Lingual sweep for clearance of pocketing Postural Changes and/or Swallow Maneuvers: Seated upright 90 degrees;Upright 30-60 min after meal                Oral Care Recommendations: Oral care BID Follow Up Recommendations: Skilled nursing-short term rehab (<3 hours/day) Assistance recommended at discharge: Intermittent Supervision/Assistance SLP Visit Diagnosis: Dysphagia, oral phase (R13.11) Plan: Continue with current plan of care           Macario Golds  01/09/2023, 12:53 PM

## 2023-01-10 DIAGNOSIS — I69391 Dysphagia following cerebral infarction: Secondary | ICD-10-CM | POA: Diagnosis not present

## 2023-01-10 NOTE — Progress Notes (Signed)
Nutrition Follow-up  DOCUMENTATION CODES:   Severe malnutrition in context of chronic illness  INTERVENTION:   -Continue Jevity 1.5 @ goal rate of 40 ml/hr via PEG -60 ml Prosource TF 20 TID -Provides 1680 kcals, 121g protein and 729 ml H2O, 20g fiber daily -Free water: 100 ml every 4 hours (600 ml)    -Multivitamin with minerals daily (crushed pill, no liquid d/t osmolality)  NUTRITION DIAGNOSIS:   Severe Malnutrition related to chronic illness, dysphagia (h/o CVA) as evidenced by moderate fat depletion, severe muscle depletion, energy intake < or equal to 75% for > or equal to 1 month.  Ongoing.  GOAL:   Patient will meet greater than or equal to 90% of their needs  Meeting with TF + PO  MONITOR:   PO intake, Supplement acceptance, Labs, Weight trends, I & O's  ASSESSMENT:   67 year old African-American female history of multiple strokes, dense left hemiparesis, hypertension who presents to the ER today with worsening dysphagia.  Patient in room, receiving meds via tube from RN, visitor at bedside.  Pt reports doing well. She is eating, states she doesn't eat breakfast but has been consuming lunch and dinner meals. Had some chicken and mashed potatoes yesterday.  No questions for RD at this time.  Admission weight: 112 lbs Current weight: 109 lbs  Medications: Lomotil, Pepcid, Multivitamin with minerals daily, Florastor, Thiamine  Labs reviewed.  Diet Order:   Diet Order             DIET - DYS 1 Room service appropriate? No; Fluid consistency: Thin  Diet effective now           Diet general                   EDUCATION NEEDS:   Education needs have been addressed  Skin:  Skin Assessment: Skin Integrity Issues: Skin Integrity Issues:: Stage II Stage II: mid sacrum  Last BM:  2/29 -type 6  Height:   Ht Readings from Last 1 Encounters:  12/28/22 '5\' 6"'$  (1.676 m)    Weight:   Wt Readings from Last 1 Encounters:  01/10/23 49.7 kg    BMI:   Body mass index is 17.68 kg/m.  Estimated Nutritional Needs:   Kcal:  [id number]  Protein:  80-95g  Fluid:  1.8L/day  Clayton Bibles, MS, RD, LDN Inpatient Clinical Dietitian Contact information available via Amion

## 2023-01-10 NOTE — Plan of Care (Signed)

## 2023-01-10 NOTE — Progress Notes (Signed)
PROGRESS NOTE    Kara Bailey  [id number] DOB: 07/18/1956 DOA: 11/29/2022 PCP: Andree Moro, DO     Brief Narrative:  Kara Bailey is a 67 year old female with history of CVA, hypertension presented the emergency department at the request of her PCP for the evaluation of persistent dysphagia.  Found to have multifactorial dysphagia likely from stroke.  Status post G-tube placement by IR.  Developed nausea, vomiting, abdominal pain on 1/26,  CT abdomen/pelvis showed possible cholecystitis, MRCP confirmed acute cholecystitis.  General surgery was  following.  Significant improvement abdominal pain, nausea, vomiting, liver enzymes.  She completed antibiotic course.  Hospital course remarkable for persistent diarrhea, anemia.  FOBT positive.  GI consulted, underwent EGD on 2/16.  She has had prolonged hospitalization.  Family cannot take patient home.  She needs a long-term nursing home and currently remains as custodial care.    New events last 24 hours / Subjective:  No new events.  Friend at the bedside.  Assessment & Plan:  Principal Problem:   Dysphagia as late effect of cerebrovascular accident (CVA) Active Problems:   Essential hypertension   Left hemiplegia (HCC)   History of stroke   Dysphagia   Protein-calorie malnutrition, severe   Decreased hemoglobin   Heme positive stool   Dysphagia  -Likely secondary to residual effect of the ischemic stroke.  Has history of esophageal stricture last dilated on 2021.  GI consulted. Barium swallow done on 11/30/2022 was nondiagnostic.   -Underwent G-tube placement by IR on 1/22.  Tolerating oral intake.   Abdomen pain, nausea, vomiting/acute cholecystitis   -Likely acalculous cholecystitis.  -CT abdomen/pelvis, MRCP confirmed acute cholecystitis without stones.   -Completed Augmentin course. -General surgery signed off.  Diarrhea  --Improved.   Normocytic anemia -Transfused with a unit of PRBC on 2/15 -FOBT checked  and was positive.  Patient also mentioned that her diarrhea was black on 2/14.  Per GI- anemia is multifactorial.  External hemorrhoids noted on exam could be the cause of heme positive stool -EGD negative for any acute findings. Aspirin and Plavix resumed on 12/29/2022. -Protonix    History of ischemic stroke with dysarthria/dysphagia -Has a dense hemiplegia since September 2023.  On Plavix, aspirin, Lipitor, Zetia at home.  Also on baclofen and Zanaflex for left-sided hemiplegia and spasticity   Depression -Celexa   Debility/deconditioning TOC following.  Insurance declined, family has appealed. No payor source. Prolonged hospitalization   In agreement with assessment of the pressure ulcer as below:  Pressure Injury 12/29/22 Sacrum Mid Stage 2 -  Partial thickness loss of dermis presenting as a shallow open injury with a red, pink wound bed without slough. (Active)  12/29/22 0830  Location: Sacrum  Location Orientation: Mid  Staging: Stage 2 -  Partial thickness loss of dermis presenting as a shallow open injury with a red, pink wound bed without slough.  Wound Description (Comments):   Present on Admission:   Dressing Type Foam - Lift dressing to assess site every shift 01/08/23 0800     Nutrition Problem: Severe Malnutrition Etiology: chronic illness, dysphagia (h/o CVA)   DVT prophylaxis:  SCDs Start: 11/30/22 0151  Code Status: Full Family Communication: None at bedside  Disposition Plan:  Status is: Inpatient Remains inpatient appropriate because: No payor source. Family will have to self-pay or take patient home. Per family, unable to care for her at home.   Antimicrobials:  Anti-infectives (From admission, onward)    Start     Dose/Rate Route Frequency  Ordered Stop   12/11/22 2200  amoxicillin-clavulanate (AUGMENTIN) 400-57 MG/5ML suspension 875 mg        875 mg Per Tube Every 12 hours 12/11/22 1137 12/18/22 1039   12/11/22 1000  amoxicillin-clavulanate  (AUGMENTIN) 875-125 MG per tablet 1 tablet  Status:  Discontinued        1 tablet Oral Every 12 hours 12/11/22 0821 12/11/22 1137   12/08/22 2000  piperacillin-tazobactam (ZOSYN) IVPB 3.375 g  Status:  Discontinued        3.375 g 12.5 mL/hr over 240 Minutes Intravenous Every 8 hours 12/08/22 1900 12/11/22 0821   12/07/22 1130  cefadroxil (DURICEF) capsule 500 mg  Status:  Discontinued        500 mg Oral 2 times daily 12/07/22 1033 12/07/22 1033   12/07/22 1130  cefadroxil (DURICEF) capsule 500 mg  Status:  Discontinued        500 mg Oral 2 times daily 12/07/22 1033 12/07/22 1034   12/07/22 1130  cefadroxil (DURICEF) capsule 500 mg  Status:  Discontinued        500 mg Per Tube 2 times daily 12/07/22 1034 12/08/22 1903   12/03/22 0700  ceFAZolin (ANCEF) IVPB 2g/100 mL premix        2 g 200 mL/hr over 30 Minutes Intravenous To Radiology 11/30/22 1602 12/04/22 0700        Objective: Vitals:   01/09/23 0534 01/09/23 1112 01/09/23 2149 01/10/23 0500  BP: (!) 116/59 (!) 109/54 118/63 120/83  Pulse: 80 78 81 82  Resp: '18 16 19   '$ Temp: 98.1 F (36.7 C) 98.6 F (37 C) 98.1 F (36.7 C) 98 F (36.7 C)  TempSrc: Oral  Oral Oral  SpO2: 98% 98% 96% 98%  Weight:    49.7 kg  Height:       No intake or output data in the 24 hours ending 01/10/23 1025  Filed Weights   01/07/23 0445 01/08/23 0452 01/10/23 0500  Weight: 53.3 kg 52 kg 49.7 kg    Examination:   Appears calm and comfortable.  Pleasant and interactive.  Talking to her friend.  Left-sided hemiplegia. G-tube in place and infusing.   Data Reviewed: I have personally reviewed following labs and imaging studies  CBC: No results for input(s): "WBC", "NEUTROABS", "HGB", "HCT", "MCV", "PLT" in the last 168 hours.  Basic Metabolic Panel: No results for input(s): "NA", "K", "CL", "CO2", "GLUCOSE", "BUN", "CREATININE", "CALCIUM", "MG", "PHOS" in the last 168 hours.  GFR: Estimated Creatinine Clearance: 52.8 mL/min (by C-G  formula based on SCr of 0.68 mg/dL). Liver Function Tests: No results for input(s): "AST", "ALT", "ALKPHOS", "BILITOT", "PROT", "ALBUMIN" in the last 168 hours. No results for input(s): "LIPASE", "AMYLASE" in the last 168 hours. No results for input(s): "AMMONIA" in the last 168 hours. Coagulation Profile: No results for input(s): "INR", "PROTIME" in the last 168 hours. Cardiac Enzymes: No results for input(s): "CKTOTAL", "CKMB", "CKMBINDEX", "TROPONINI" in the last 168 hours. BNP (last 3 results) No results for input(s): "PROBNP" in the last 8760 hours. HbA1C: No results for input(s): "HGBA1C" in the last 72 hours. CBG: No results for input(s): "GLUCAP" in the last 168 hours. Lipid Profile: No results for input(s): "CHOL", "HDL", "LDLCALC", "TRIG", "CHOLHDL", "LDLDIRECT" in the last 72 hours. Thyroid Function Tests: No results for input(s): "TSH", "T4TOTAL", "FREET4", "T3FREE", "THYROIDAB" in the last 72 hours. Anemia Panel: No results for input(s): "VITAMINB12", "FOLATE", "FERRITIN", "TIBC", "IRON", "RETICCTPCT" in the last 72 hours. Sepsis Labs: No results for  input(s): "PROCALCITON", "LATICACIDVEN" in the last 168 hours.  No results found for this or any previous visit (from the past 240 hour(s)).    Radiology Studies: No results found.    Scheduled Meds:  aspirin  81 mg Per Tube Daily   atorvastatin  80 mg Per Tube QPC supper   citalopram  10 mg Per Tube q morning   clopidogrel  75 mg Per Tube Daily   diclofenac Sodium  4 g Topical QID   diphenoxylate-atropine  2 tablet Per Tube QID   ezetimibe  10 mg Per Tube Daily   famotidine  10.4 mg Per Tube QHS   feeding supplement (JEVITY 1.5 CAL/FIBER)  1,000 mL Per Tube Q24H   feeding supplement (PROSource TF20)  60 mL Per Tube TID BM   free water  100 mL Per Tube Q4H   melatonin  3 mg Per Tube QHS   multivitamin with minerals  1 tablet Per Tube Daily   mouth rinse  15 mL Mouth Rinse 4 times per day   saccharomyces  boulardii  250 mg Per Tube BID   thiamine  100 mg Per Tube Daily   tiZANidine  2 mg Per Tube BID   Continuous Infusions:   LOS: 40 days   Time spent: 25 minutes.

## 2023-01-10 NOTE — TOC Progression Note (Signed)
Transition of Care [facility]) - Progression Note    Patient Details  Name: Kara Bailey MRN: [id number] Date of Birth: 03-11-1956  Transition of Care [facility]) CM/SW Contact  Joaquin Courts, RN Phone Number: 01/10/2023, 12:44 PM  Clinical Narrative:    CM reached out to [facility] (VM left), [facility] (VM left), corporate liaison for [facility] (VM left) and spoke with admissions rep at [facility] to request review for LTC bed.    Expected Discharge Plan: Skilled Nursing Facility Barriers to Discharge: SNF Authorization Denied  Expected Discharge Plan and Services In-house Referral: NA Discharge Planning Services: CM Consult Post Acute Care Choice: [place] Living arrangements for the past 2 months: Apartment Expected Discharge Date: 12/07/22               DME Arranged: N/A DME Agency: NA       HH Arranged: NA HH Agency: NA         Social Determinants of Health (SDOH) Interventions SDOH Screenings   Food Insecurity: Unknown (12/04/2022)  Housing: Low Risk  (12/04/2022)  Transportation Needs: Unknown (12/04/2022)  Utilities: Unknown (12/04/2022)  Depression (PHQ2-9): High Risk (09/19/2022)  Tobacco Use: High Risk (12/30/2022)    Readmission Risk Interventions    12/17/2022    1:42 PM  Readmission Risk Prevention Plan  Transportation Screening Complete  PCP or Specialist Appt within 3-5 Days Complete  HRI or [place] Complete  Social Work Consult for [place] Planning/Counseling [place] Not Applicable  Medication Review Press photographer) Referral to Pharmacy

## 2023-01-10 NOTE — Progress Notes (Signed)
Mobility Specialist - Progress Note   01/10/23 1026  Mobility  Activity Transferred from bed to chair  Level of Assistance Moderate assist, patient does 50-74%  Assistive Device None  Distance Ambulated (ft) 2 ft  Activity Response Tolerated well  Mobility Referral Yes  $Mobility charge 1 Mobility   Pt received in bed and agreed to transfer. Pt hugged this mobility tech with RUE and stood using only her RLE. Pt took very small steps for transfer to chair. Pt with all needs met and family in room.  Roderick Pee Mobility Specialist

## 2023-01-11 DIAGNOSIS — I69391 Dysphagia following cerebral infarction: Secondary | ICD-10-CM | POA: Diagnosis not present

## 2023-01-11 NOTE — Progress Notes (Signed)
PROGRESS NOTE    Kara Bailey  [id number] DOB: [birth year] DOA: 11/29/2022 PCP: Andree Moro, DO    Brief Narrative:  Prolonged hospitalization.  See HPI.   Assessment & Plan:  Hemiplegic.  Now on tube feeding.  Family unable to take care of her.  Waiting for long-term nursing home availability. No changes today.    DVT prophylaxis: SCDs Start: 11/30/22 0151   Code Status: Full code Family Communication: None Disposition Plan: Status is: Inpatient Remains inpatient appropriate because: Unsafe discharge planning     Consultants:  IR  Procedures:  G-tube placement  Antimicrobials:  Completed   Subjective: No new events  Objective: Vitals:   01/10/23 0500 01/10/23 1400 01/10/23 2129 01/11/23 0516  BP: 120/83 118/63 (!) 140/95 119/72  Pulse: 82 81 74 79  Resp:  '16 16 16  '$ Temp: 98 F (36.7 C) 99 F (37.2 C) 98.4 F (36.9 C) 98.7 F (37.1 C)  TempSrc: Oral  Oral Oral  SpO2: 98% 97% 100% 100%  Weight: 49.7 kg     Height:        Intake/Output Summary (Last 24 hours) at 01/11/2023 1034 Last data filed at 01/11/2023 1000 Gross per 24 hour  Intake 30 ml  Output 0 ml  Net 30 ml   Filed Weights   01/07/23 0445 01/08/23 0452 01/10/23 0500  Weight: 53.3 kg 52 kg 49.7 kg    Examination:  Looks comfortable    Data Reviewed: I have personally reviewed following labs and imaging studies  CBC: No results for input(s): "WBC", "NEUTROABS", "HGB", "HCT", "MCV", "PLT" in the last 168 hours. Basic Metabolic Panel: No results for input(s): "NA", "K", "CL", "CO2", "GLUCOSE", "BUN", "CREATININE", "CALCIUM", "MG", "PHOS" in the last 168 hours. GFR: Estimated Creatinine Clearance: 52.8 mL/min (by C-G formula based on SCr of 0.68 mg/dL). Liver Function Tests: No results for input(s): "AST", "ALT", "ALKPHOS", "BILITOT", "PROT", "ALBUMIN" in the last 168 hours. No results for input(s): "LIPASE", "AMYLASE" in the last 168 hours. No results for input(s):  "AMMONIA" in the last 168 hours. Coagulation Profile: No results for input(s): "INR", "PROTIME" in the last 168 hours. Cardiac Enzymes: No results for input(s): "CKTOTAL", "CKMB", "CKMBINDEX", "TROPONINI" in the last 168 hours. BNP (last 3 results) No results for input(s): "PROBNP" in the last 8760 hours. HbA1C: No results for input(s): "HGBA1C" in the last 72 hours. CBG: No results for input(s): "GLUCAP" in the last 168 hours. Lipid Profile: No results for input(s): "CHOL", "HDL", "LDLCALC", "TRIG", "CHOLHDL", "LDLDIRECT" in the last 72 hours. Thyroid Function Tests: No results for input(s): "TSH", "T4TOTAL", "FREET4", "T3FREE", "THYROIDAB" in the last 72 hours. Anemia Panel: No results for input(s): "VITAMINB12", "FOLATE", "FERRITIN", "TIBC", "IRON", "RETICCTPCT" in the last 72 hours. Sepsis Labs: No results for input(s): "PROCALCITON", "LATICACIDVEN" in the last 168 hours.  No results found for this or any previous visit (from the past 240 hour(s)).       Radiology Studies: No results found.      Scheduled Meds:  aspirin  81 mg Per Tube Daily   atorvastatin  80 mg Per Tube QPC supper   citalopram  10 mg Per Tube q morning   clopidogrel  75 mg Per Tube Daily   diclofenac Sodium  4 g Topical QID   diphenoxylate-atropine  2 tablet Per Tube QID   ezetimibe  10 mg Per Tube Daily   famotidine  10.4 mg Per Tube QHS   feeding supplement (JEVITY 1.5 CAL/FIBER)  1,000 mL Per Tube  Q24H   feeding supplement (PROSource TF20)  60 mL Per Tube TID BM   free water  100 mL Per Tube Q4H   melatonin  3 mg Per Tube QHS   multivitamin with minerals  1 tablet Per Tube Daily   mouth rinse  15 mL Mouth Rinse 4 times per day   saccharomyces boulardii  250 mg Per Tube BID   thiamine  100 mg Per Tube Daily   tiZANidine  2 mg Per Tube BID   Continuous Infusions:   LOS: 41 days    Time spent: 20 minutes    Barb Merino, MD [facility] Pager [phone]

## 2023-01-11 NOTE — TOC Progression Note (Signed)
Transition of Care [facility]) - Progression Note    Patient Details  Name: Kara Bailey MRN: [id number] Date of Birth: 1956/01/24  [facility]) CM/SW Contact  Joaquin Courts, RN Phone Number: 01/11/2023, 11:12 AM  Clinical Narrative:    CM spoke with [facility] rep who reports no open beds and no LTC beds anticipated to open at this time.   Expected Discharge Plan: Skilled Nursing Facility Barriers to Discharge: SNF Authorization Denied  Expected Discharge Plan and Services In-house Referral: NA Discharge Planning Services: CM Consult Post Acute Care Choice: [place] Living arrangements for the past 2 months: Apartment Expected Discharge Date: 12/07/22               DME Arranged: N/A DME Agency: NA       HH Arranged: NA HH Agency: NA         Social Determinants of Health (SDOH) Interventions SDOH Screenings   Food Insecurity: Unknown (12/04/2022)  Housing: Low Risk  (12/04/2022)  Transportation Needs: Unknown (12/04/2022)  Utilities: Unknown (12/04/2022)  Depression (PHQ2-9): High Risk (09/19/2022)  Tobacco Use: High Risk (12/30/2022)    Readmission Risk Interventions    12/17/2022    1:42 PM  Readmission Risk Prevention Plan  Transportation Screening Complete  PCP or Specialist Appt within 3-5 Days Complete  HRI or [place] Complete  Social Work Consult for [place] Planning/Counseling [place] Not Applicable  Medication Review Press photographer) Referral to Pharmacy

## 2023-01-11 NOTE — Progress Notes (Signed)
Physical Therapy Treatment Patient Details Name: Kara Bailey MRN: [id number] DOB: [birth year] Today's Date: 01/11/2023   History of Present Illness Patient is 67 year old female admitted 11/29/22 with multifactorial dysphagia; s/p PEG tube 12/03/22; EGD 2/16.  Pt with hx of CVA with L hemiparesis, HTN, iliac stent, hep C, GERD    PT Comments    Focus on transfer training, initially using slideboard, min A to laterally scoot going to strong R side, therapist managing slideboard placement and lines for safety. Pt reports not liking slideboard, also unable to manage board without assistance. Returned to stand pivot transfers with pt using RUE to pull on w/c armrest and bedrail and pt able to power up to standing with min A then assisted with pivoting to seated surface. Transfers with pt pushing with RUE or hold around therapist in bear hug position, pt needing mod A to power up to standing and pivot to seated surface. Pt remains up in recliner with all needs in place at EOS.   Recommendations for follow up therapy are one component of a multi-disciplinary discharge planning process, led by the attending physician.  Recommendations may be updated based on patient status, additional functional criteria and insurance authorization.  Follow Up Recommendations  Long-term institutional care without follow-up therapy Can patient physically be transported by private vehicle: No   Assistance Recommended at Discharge Frequent or constant Supervision/Assistance  Patient can return home with the following A little help with bathing/dressing/bathroom;Assistance with cooking/housework;Help with stairs or ramp for entrance;A lot of help with walking and/or transfers;Assist for transportation;A little help with walking and/or transfers   Equipment Recommendations  None recommended by PT    Recommendations for Other Services       Precautions / Restrictions Precautions Precautions: Fall Precaution  Comments: hx of CVA last year with L sided weakness; L knee buckles Restrictions Weight Bearing Restrictions: No     Mobility  Bed Mobility Overal bed mobility: Needs Assistance Bed Mobility: Supine to Sit  Supine to sit: Min guard  General bed mobility comments: pt pulling on bedrails and using R leg to self assist L leg to EOB, min guard for safety    Transfers Overall transfer level: Needs assistance Equipment used: Sliding board, 1 person hand held assist  Stand pivot transfers: Min assist, Mod assist   Lateral/Scoot Transfers: Min assist, With slide board General transfer comment: bed to w/c with min A going to strong R side, therapist managing slideboard placement; stand pivot transfers needing min A when pt able to pull with RUE on bedrail or armrest and mod A when therapist positioned anterior in bear hug position    Ambulation/Gait    Stairs      Wheelchair Mobility    Modified Rankin (Stroke Patients Only)       Balance Overall balance assessment: Needs assistance Sitting-balance support: No upper extremity supported, Feet supported Sitting balance-Leahy Scale: Good Sitting balance - Comments: static sitting   Standing balance support: Single extremity supported Standing balance-Leahy Scale: Poor Standing balance comment: RUE support and assist from therapist     Cognition Arousal/Alertness: Awake/alert Behavior During Therapy: WFL for tasks assessed/performed Overall Cognitive Status: Within Functional Limits for tasks assessed     Exercises      General Comments        Pertinent Vitals/Pain Pain Assessment Pain Assessment: No/denies pain    Home Living  Prior Function            PT Goals (current goals can now be found in the care plan section) Acute Rehab PT Goals PT Goal Formulation: With patient Time For Goal Achievement: 01/18/23 Potential to Achieve Goals: Good Progress towards PT goals:  Progressing toward goals    Frequency    Min 2X/week      PT Plan Current plan remains appropriate    Co-evaluation              AM-PAC PT "6 Clicks" Mobility   Outcome Measure  Help needed turning from your back to your side while in a flat bed without using bedrails?: A Lot Help needed moving from lying on your back to sitting on the side of a flat bed without using bedrails?: A Lot Help needed moving to and from a bed to a chair (including a wheelchair)?: A Lot Help needed standing up from a chair using your arms (e.g., wheelchair or bedside chair)?: A Lot Help needed to walk in hospital room?: Total Help needed climbing 3-5 steps with a railing? : Total 6 Click Score: 10    End of Session Equipment Utilized During Treatment: Gait belt Activity Tolerance: Patient tolerated treatment well Patient left: in chair;with call bell/phone within reach Nurse Communication: Mobility status PT Visit Diagnosis: Other abnormalities of gait and mobility (R26.89);Unsteadiness on feet (R26.81)     Time: [id number] PT Time Calculation (min) (ACUTE ONLY): 27 min  Charges:  $Therapeutic Activity: 23-37 mins                      Tori Burnetta Kohls PT, DPT 01/11/23, 10:59 AM

## 2023-01-11 NOTE — TOC Progression Note (Signed)
Transition of Care [facility]) - Progression Note    Patient Details  Name: Kara Bailey MRN: [id number] Date of Birth: 1956-01-23  Transition of Care [facility]) CM/SW Contact  Joaquin Courts, RN Phone Number: 01/11/2023, 2:50 PM  Clinical Narrative:    CM spoke with corporate liaison for [facility], who reports will review clinicals and notify this CM if a bed offer can be extended.   Expected Discharge Plan: Skilled Nursing Facility Barriers to Discharge: SNF Authorization Denied  Expected Discharge Plan and Services In-house Referral: NA Discharge Planning Services: CM Consult Post Acute Care Choice: [place] Living arrangements for the past 2 months: Apartment Expected Discharge Date: 12/07/22               DME Arranged: N/A DME Agency: NA       HH Arranged: NA HH Agency: NA         Social Determinants of Health (SDOH) Interventions SDOH Screenings   Food Insecurity: Unknown (12/04/2022)  Housing: Low Risk  (12/04/2022)  Transportation Needs: Unknown (12/04/2022)  Utilities: Unknown (12/04/2022)  Depression (PHQ2-9): High Risk (09/19/2022)  Tobacco Use: High Risk (12/30/2022)    Readmission Risk Interventions    12/17/2022    1:42 PM  Readmission Risk Prevention Plan  Transportation Screening Complete  PCP or Specialist Appt within 3-5 Days Complete  HRI or [place] Complete  Social Work Consult for [place] Planning/Counseling [place] Not Applicable  Medication Review Press photographer) Referral to Pharmacy

## 2023-01-11 NOTE — Progress Notes (Addendum)
Speech Language Pathology Treatment: Dysphagia;Cognitive-Linquistic  Patient Details Name: Kara Bailey MRN: [id number] DOB: Nov 28, 1955 Today's Date: 01/11/2023 Time: 1525-1600 SLP Time Calculation (min) (ACUTE ONLY): 35 min  Assessment / Plan / Recommendation Clinical Impression  Purpose of today's visit is to address dysarthria, dysphagia goals. Pt greeted in bed, upright and friendly. RN giving pt her medications via her tube upon SLP admit to room. Pt admits to not doing her RMST yesterday saying she didn't feel good and didn't think she was doing it correctly. She also reports displeasure with the pureed foods - stating the mashed potatoes are fake and admits to sensing slow clearance of proximal esophagus - for which pt drinks liquids to clear. SLP brought her chocolate and Blueberry greek yogurt but given her "burning", encouraged her to consume later.   RMST conducted using [employer] EMT with moderate verba/visual cues for adequate effort and appropriate timing between repetitions. SlP modified intensity to 7 cm H20 due to pt's weakness. Encouraged her to continue exercises 3 x's a day *set of 10* making great effort for functional improvement.   Reviewed dysarthria compensation strategies using return demonstration for speaking loudly and chunking information - mod cues needed fading to min during session. Recorded pt on SLP phone for her feedback - and she correctly identified improved articulation using teach back.  Pt tends to point instead of speaking- encouraged her to phonate as much as able. When asked if she was talking to people, she clearly state "Who?" - concerning for component of depression impacting her carryover. With encouragement, pt makes good effort - but will need encouragement to generalize.     HPI HPI: Kara Bailey is a 67 y.o. female who presented to [facility] ED on 11/29/22 with worsening dysphagia c/b inability to swallow pills or solid foods. She was sent to  ED by PCP for PEG placement (PEG 12/03/22). PMHx includes CVAs, dysarthria,  first stroke seen in [facility] is 2022, HTN, prior epigastric hernia repair, iliac stent. Pt has a long hx of dysphagia and has worked with SLP in acute, inpatient, and OP settings over two years. She has had multiple MBS studies. 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  Pt's dysphagia has been primarily oral (difficulty with onset of oral phase and oral control) but pharyngeal function has been largely intact with good bolus clearance and adequate airway protection. Review of 11/30/22 esophagram reveals similar mechanics of swallow. 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 SLP has been seeing pt for dysphagia/dysarthria treatment.      SLP Plan  Continue with current plan of care      Recommendations for follow up therapy are one component of a multi-disciplinary discharge planning process, led by the attending physician.  Recommendations may be updated based on patient status, additional functional criteria and insurance authorization.    Recommendations  Diet recommendations: Dysphagia 1 (puree);Thin liquid Liquids provided via: Straw Medication Administration: Via alternative means Compensations: Slow rate;Small sips/bites;Other (Comment);Lingual sweep for clearance of pocketing                Oral Care Recommendations: Oral care BID Follow Up Recommendations: Skilled nursing-short term rehab (<3 hours/day) Assistance recommended at discharge: Intermittent Supervision/Assistance SLP Visit Diagnosis: Dysphagia, oral phase (R13.11);Dysarthria and anarthria (R47.1) Plan: Continue with current plan of care  Kathleen Lime, MS [facility] SLP Acute Rehab Services Office [phone]   Macario Golds  01/11/2023, 5:31 PM

## 2023-01-11 NOTE — Plan of Care (Signed)

## 2023-01-12 DIAGNOSIS — I69391 Dysphagia following cerebral infarction: Secondary | ICD-10-CM | POA: Diagnosis not present

## 2023-01-12 NOTE — Progress Notes (Signed)
PROGRESS NOTE    PROMYCE WIXOM  [id number] DOB: 07/13/1956 DOA: 11/29/2022 PCP: Andree Moro, DO    Brief Narrative:  Prolonged hospitalization.  See HPI. No new events.   Assessment & Plan:  Hemiplegic.  Now on tube feeding.  Family unable to take care of her.  Waiting for long-term nursing home availability. No changes today.    DVT prophylaxis: SCDs Start: 11/30/22 0151   Code Status: Full code Family Communication: None Disposition Plan: Status is: Inpatient Remains inpatient appropriate because: Unsafe discharge planning     Consultants:  IR  Procedures:  G-tube placement  Antimicrobials:  Completed   Subjective: Patient denies any complaints.  No family at the bedside.  Objective: Vitals:   01/11/23 1355 01/11/23 1950 01/12/23 0236 01/12/23 0500  BP: 104/64 119/76 105/68   Pulse: 78 75 80   Resp: '16 16 14   '$ Temp: 98 F (36.7 C) 98.6 F (37 C) 98.5 F (36.9 C)   TempSrc:      SpO2: 99% 99% 99%   Weight:    52 kg  Height:        Intake/Output Summary (Last 24 hours) at 01/12/2023 1040 Last data filed at 01/11/2023 1800 Gross per 24 hour  Intake 120 ml  Output --  Net 120 ml    Filed Weights   01/08/23 0452 01/10/23 0500 01/12/23 0500  Weight: 52 kg 49.7 kg 52 kg    Examination:  Looks comfortable.  On room air.  Pleasant to conversation but difficult to understand.    Data Reviewed: I have personally reviewed following labs and imaging studies  CBC: No results for input(s): "WBC", "NEUTROABS", "HGB", "HCT", "MCV", "PLT" in the last 168 hours. Basic Metabolic Panel: No results for input(s): "NA", "K", "CL", "CO2", "GLUCOSE", "BUN", "CREATININE", "CALCIUM", "MG", "PHOS" in the last 168 hours. GFR: Estimated Creatinine Clearance: 55.3 mL/min (by C-G formula based on SCr of 0.68 mg/dL). Liver Function Tests: No results for input(s): "AST", "ALT", "ALKPHOS", "BILITOT", "PROT", "ALBUMIN" in the last 168 hours. No results for  input(s): "LIPASE", "AMYLASE" in the last 168 hours. No results for input(s): "AMMONIA" in the last 168 hours. Coagulation Profile: No results for input(s): "INR", "PROTIME" in the last 168 hours. Cardiac Enzymes: No results for input(s): "CKTOTAL", "CKMB", "CKMBINDEX", "TROPONINI" in the last 168 hours. BNP (last 3 results) No results for input(s): "PROBNP" in the last 8760 hours. HbA1C: No results for input(s): "HGBA1C" in the last 72 hours. CBG: No results for input(s): "GLUCAP" in the last 168 hours. Lipid Profile: No results for input(s): "CHOL", "HDL", "LDLCALC", "TRIG", "CHOLHDL", "LDLDIRECT" in the last 72 hours. Thyroid Function Tests: No results for input(s): "TSH", "T4TOTAL", "FREET4", "T3FREE", "THYROIDAB" in the last 72 hours. Anemia Panel: No results for input(s): "VITAMINB12", "FOLATE", "FERRITIN", "TIBC", "IRON", "RETICCTPCT" in the last 72 hours. Sepsis Labs: No results for input(s): "PROCALCITON", "LATICACIDVEN" in the last 168 hours.  No results found for this or any previous visit (from the past 240 hour(s)).       Radiology Studies: No results found.      Scheduled Meds:  aspirin  81 mg Per Tube Daily   atorvastatin  80 mg Per Tube QPC supper   citalopram  10 mg Per Tube q morning   clopidogrel  75 mg Per Tube Daily   diclofenac Sodium  4 g Topical QID   diphenoxylate-atropine  2 tablet Per Tube QID   ezetimibe  10 mg Per Tube Daily   famotidine  10.4 mg Per Tube QHS   feeding supplement (JEVITY 1.5 CAL/FIBER)  1,000 mL Per Tube Q24H   feeding supplement (PROSource TF20)  60 mL Per Tube TID BM   free water  100 mL Per Tube Q4H   melatonin  3 mg Per Tube QHS   multivitamin with minerals  1 tablet Per Tube Daily   mouth rinse  15 mL Mouth Rinse 4 times per day   saccharomyces boulardii  250 mg Per Tube BID   thiamine  100 mg Per Tube Daily   tiZANidine  2 mg Per Tube BID   Continuous Infusions:   LOS: 42 days    Time spent: 25  minutes    Barb Merino, MD [facility] Pager [phone]

## 2023-01-12 NOTE — Plan of Care (Signed)

## 2023-01-13 DIAGNOSIS — I69391 Dysphagia following cerebral infarction: Secondary | ICD-10-CM | POA: Diagnosis not present

## 2023-01-13 NOTE — Progress Notes (Signed)
PROGRESS NOTE    Kara Bailey  [id number] DOB: 07/26/1956 DOA: 11/29/2022 PCP: Andree Moro, DO    Brief Narrative:  Prolonged hospitalization.  See HPI. No new events.   Assessment & Plan:  Hemiplegic.  Now on tube feeding.  Family unable to take care of her.  Waiting for long-term nursing home availability.  No new issues.    DVT prophylaxis: SCDs Start: 11/30/22 0151   Code Status: Full code Family Communication: None Disposition Plan: Status is: Inpatient Remains inpatient appropriate because: Unsafe discharge planning     Consultants:  IR  Procedures:  G-tube placement  Antimicrobials:  Completed   Subjective:  No new events.  Objective: Vitals:   01/12/23 0500 01/12/23 1343 01/12/23 1925 01/13/23 0433  BP:  130/68 (!) 162/109 132/75  Pulse:  77 93 77  Resp:  '16 17 17  '$ Temp:  98.1 F (36.7 C) 98.1 F (36.7 C) 98.1 F (36.7 C)  TempSrc:      SpO2:  100% 94% 100%  Weight: 52 kg   50.1 kg  Height:       No intake or output data in the 24 hours ending 01/13/23 1113  Filed Weights   01/10/23 0500 01/12/23 0500 01/13/23 0433  Weight: 49.7 kg 52 kg 50.1 kg    Examination:  Looks comfortable.  On room air.  Pleasant to conversation but difficult to understand.    Data Reviewed: I have personally reviewed following labs and imaging studies  CBC: No results for input(s): "WBC", "NEUTROABS", "HGB", "HCT", "MCV", "PLT" in the last 168 hours. Basic Metabolic Panel: No results for input(s): "NA", "K", "CL", "CO2", "GLUCOSE", "BUN", "CREATININE", "CALCIUM", "MG", "PHOS" in the last 168 hours. GFR: Estimated Creatinine Clearance: 53.2 mL/min (by C-G formula based on SCr of 0.68 mg/dL). Liver Function Tests: No results for input(s): "AST", "ALT", "ALKPHOS", "BILITOT", "PROT", "ALBUMIN" in the last 168 hours. No results for input(s): "LIPASE", "AMYLASE" in the last 168 hours. No results for input(s): "AMMONIA" in the last 168  hours. Coagulation Profile: No results for input(s): "INR", "PROTIME" in the last 168 hours. Cardiac Enzymes: No results for input(s): "CKTOTAL", "CKMB", "CKMBINDEX", "TROPONINI" in the last 168 hours. BNP (last 3 results) No results for input(s): "PROBNP" in the last 8760 hours. HbA1C: No results for input(s): "HGBA1C" in the last 72 hours. CBG: No results for input(s): "GLUCAP" in the last 168 hours. Lipid Profile: No results for input(s): "CHOL", "HDL", "LDLCALC", "TRIG", "CHOLHDL", "LDLDIRECT" in the last 72 hours. Thyroid Function Tests: No results for input(s): "TSH", "T4TOTAL", "FREET4", "T3FREE", "THYROIDAB" in the last 72 hours. Anemia Panel: No results for input(s): "VITAMINB12", "FOLATE", "FERRITIN", "TIBC", "IRON", "RETICCTPCT" in the last 72 hours. Sepsis Labs: No results for input(s): "PROCALCITON", "LATICACIDVEN" in the last 168 hours.  No results found for this or any previous visit (from the past 240 hour(s)).       Radiology Studies: No results found.      Scheduled Meds:  aspirin  81 mg Per Tube Daily   atorvastatin  80 mg Per Tube QPC supper   citalopram  10 mg Per Tube q morning   clopidogrel  75 mg Per Tube Daily   diclofenac Sodium  4 g Topical QID   diphenoxylate-atropine  2 tablet Per Tube QID   ezetimibe  10 mg Per Tube Daily   famotidine  10.4 mg Per Tube QHS   feeding supplement (JEVITY 1.5 CAL/FIBER)  1,000 mL Per Tube Q24H   feeding supplement (  PROSource TF20)  60 mL Per Tube TID BM   free water  100 mL Per Tube Q4H   melatonin  3 mg Per Tube QHS   multivitamin with minerals  1 tablet Per Tube Daily   mouth rinse  15 mL Mouth Rinse 4 times per day   saccharomyces boulardii  250 mg Per Tube BID   thiamine  100 mg Per Tube Daily   tiZANidine  2 mg Per Tube BID   Continuous Infusions:   LOS: 43 days    Time spent: 25 minutes    Barb Merino, MD [facility] Pager [phone]

## 2023-01-14 MED ORDER — JEVITY 1.5 CAL/FIBER PO LIQD: 1000.0000 mL | ORAL | Status: DC

## 2023-01-14 MED ORDER — PROSOURCE TF20 ENFIT COMPATIBL EN LIQD: 60.0000 mL | Freq: Three times a day (TID) | ENTERAL | Status: DC

## 2023-01-14 MED ORDER — DIPHENOXYLATE-ATROPINE 2.5-0.025 MG PO TABS: 2.0000 | ORAL_TABLET | Freq: Four times a day (QID) | ORAL | 0 refills | Status: DC

## 2023-01-14 NOTE — TOC Progression Note (Signed)
Transition of Care [facility]) - Progression Note    Patient Details  Name: Kara Bailey MRN: [id number] Date of Birth: June 07, 1956  [facility] of [facility] CM/SW Contact  Joaquin Courts, RN Phone Number: 01/14/2023, 10:55 AM  Clinical Narrative:    Arlis Porta in [place] has extended a bed offer to patient.  CM met with patient at bedside and presented offer.  Patient attempted to call her daughter to discuss with no response.  This CM also called daughter and left a HIPAA compliant VM requesting call back.  Patient did sign the designation of authorized representative and consent for release of information allowing [place] to communicate with DSS Insurance account manager. Patient reports she wants to discuss facility with her daughter before formally accepting.   Expected Discharge Plan: Skilled Nursing Facility Barriers to Discharge: SNF Authorization Denied  Expected Discharge Plan and Services In-house Referral: NA Discharge Planning Services: CM Consult Post Acute Care Choice: [place] Living arrangements for the past 2 months: Apartment Expected Discharge Date: 12/07/22               DME Arranged: N/A DME Agency: NA       HH Arranged: NA HH Agency: NA         Social Determinants of Health (SDOH) Interventions SDOH Screenings   Food Insecurity: Unknown (12/04/2022)  Housing: Low Risk  (12/04/2022)  Transportation Needs: Unknown (12/04/2022)  Utilities: Unknown (12/04/2022)  Depression (PHQ2-9): High Risk (09/19/2022)  Tobacco Use: High Risk (12/30/2022)    Readmission Risk Interventions    12/17/2022    1:42 PM  Readmission Risk Prevention Plan  Transportation Screening Complete  PCP or Specialist Appt within 3-5 Days Complete  HRI or [place] Complete  Social Work Consult for [place] Planning/Counseling Early Not Applicable  Medication Review Press photographer) Referral to Pharmacy

## 2023-01-14 NOTE — Progress Notes (Signed)
PROGRESS NOTE    Kara Bailey  [id number] DOB: 13-Nov-1955 DOA: 11/29/2022 PCP: Andree Moro, DO    Brief Narrative:  Prolonged hospitalization.  See HPI. No new events.   Assessment & Plan:  Hemiplegic.  Now on tube feeding.  Family unable to take care of her.  Waiting for long-term nursing home availability.  No new issues.    DVT prophylaxis: SCDs Start: 11/30/22 0151   Code Status: Full code Family Communication: None Disposition Plan: Status is: Inpatient Remains inpatient appropriate because: Unsafe discharge planning     Consultants:  IR  Procedures:  G-tube placement  Antimicrobials:  Completed   Subjective:  Patient seen and examined.  She tells me that should help her get out of the bed and make her left leg move.  No other overnight events.  Objective: Vitals:   01/13/23 1348 01/13/23 2050 01/14/23 0428 01/14/23 0429  BP: (!) 141/77 (!) 162/99  127/70  Pulse: 78 88  74  Resp: '16 14  14  '$ Temp: 98.1 F (36.7 C) 98.5 F (36.9 C)  98.5 F (36.9 C)  TempSrc:  Oral  Oral  SpO2: 98% 97%  99%  Weight:   51.2 kg   Height:        Intake/Output Summary (Last 24 hours) at 01/14/2023 1031 Last data filed at 01/14/2023 0700 Gross per 24 hour  Intake 480 ml  Output 400 ml  Net 80 ml    Filed Weights   01/12/23 0500 01/13/23 0433 01/14/23 0428  Weight: 52 kg 50.1 kg 51.2 kg    Examination:  Looks comfortable.  Radio producer on iPad. Left hemiplegia. PEG tube clean and dry.  Tube feeding infusing.    Data Reviewed: I have personally reviewed following labs and imaging studies  CBC: No results for input(s): "WBC", "NEUTROABS", "HGB", "HCT", "MCV", "PLT" in the last 168 hours. Basic Metabolic Panel: No results for input(s): "NA", "K", "CL", "CO2", "GLUCOSE", "BUN", "CREATININE", "CALCIUM", "MG", "PHOS" in the last 168 hours. GFR: Estimated Creatinine Clearance: 54.4 mL/min (by C-G formula based on SCr of 0.68 mg/dL). Liver Function  Tests: No results for input(s): "AST", "ALT", "ALKPHOS", "BILITOT", "PROT", "ALBUMIN" in the last 168 hours. No results for input(s): "LIPASE", "AMYLASE" in the last 168 hours. No results for input(s): "AMMONIA" in the last 168 hours. Coagulation Profile: No results for input(s): "INR", "PROTIME" in the last 168 hours. Cardiac Enzymes: No results for input(s): "CKTOTAL", "CKMB", "CKMBINDEX", "TROPONINI" in the last 168 hours. BNP (last 3 results) No results for input(s): "PROBNP" in the last 8760 hours. HbA1C: No results for input(s): "HGBA1C" in the last 72 hours. CBG: No results for input(s): "GLUCAP" in the last 168 hours. Lipid Profile: No results for input(s): "CHOL", "HDL", "LDLCALC", "TRIG", "CHOLHDL", "LDLDIRECT" in the last 72 hours. Thyroid Function Tests: No results for input(s): "TSH", "T4TOTAL", "FREET4", "T3FREE", "THYROIDAB" in the last 72 hours. Anemia Panel: No results for input(s): "VITAMINB12", "FOLATE", "FERRITIN", "TIBC", "IRON", "RETICCTPCT" in the last 72 hours. Sepsis Labs: No results for input(s): "PROCALCITON", "LATICACIDVEN" in the last 168 hours.  No results found for this or any previous visit (from the past 240 hour(s)).       Radiology Studies: No results found.      Scheduled Meds:  aspirin  81 mg Per Tube Daily   atorvastatin  80 mg Per Tube QPC supper   citalopram  10 mg Per Tube q morning   clopidogrel  75 mg Per Tube Daily   diclofenac  Sodium  4 g Topical QID   diphenoxylate-atropine  2 tablet Per Tube QID   ezetimibe  10 mg Per Tube Daily   famotidine  10.4 mg Per Tube QHS   feeding supplement (JEVITY 1.5 CAL/FIBER)  1,000 mL Per Tube Q24H   feeding supplement (PROSource TF20)  60 mL Per Tube TID BM   free water  100 mL Per Tube Q4H   melatonin  3 mg Per Tube QHS   multivitamin with minerals  1 tablet Per Tube Daily   mouth rinse  15 mL Mouth Rinse 4 times per day   saccharomyces boulardii  250 mg Per Tube BID   thiamine  100 mg  Per Tube Daily   tiZANidine  2 mg Per Tube BID   Continuous Infusions:   LOS: 44 days    Time spent: 25 minutes    Barb Merino, MD [facility] Pager [phone]

## 2023-01-14 NOTE — Discharge Summary (Signed)
Physician Discharge Summary  [name] [id number] DOB: 1956-11-09 DOA: 11/29/2022  PCP: Andree Moro, DO  Admit date: 11/29/2022 Discharge date: 01/14/2023  Admitted From: Home Disposition: Nursing home  Recommendations for Outpatient Follow-up:  Follow-up at nursing home   Discharge Condition: Fair CODE STATUS: Full code Diet recommendation: Dysphagia 1 diet, thin liquids, aspiration precautions.  Tube feeding.  Discharge summary: Kara Bailey is a 67 year old female with history of CVA, hypertension presented the emergency department at the request of her PCP for the evaluation of persistent dysphagia.  Found to have multifactorial dysphagia likely from stroke.  Status post G-tube placement by IR.  Developed nausea, vomiting, abdominal pain on 1/26,  CT abdomen/pelvis showed possible cholecystitis, MRCP confirmed acute cholecystitis.  General surgery was  following.  Significant improvement abdominal pain, nausea, vomiting, liver enzymes.  She completed antibiotic course.  Hospital course remarkable for persistent diarrhea, anemia.  FOBT positive.  GI consulted, underwent EGD on 2/16.  Patient is still in the hospital for more than 1 and half months waiting for nursing home placement.  She is stable.  Able to go to nursing home today.   Dysphagia  -Likely secondary to residual effect of the ischemic stroke.  Has history of esophageal stricture last dilated on 2021.  GI consulted. Barium swallow done on 11/30/2022 was nondiagnostic.   -Underwent G-tube placement by IR on 1/22.  Tolerating dysphagia 1 diet with aspiration precautions.  She is on tube feeding that will be continued.   Abdomen pain, nausea, vomiting/acute cholecystitis   -Likely acalculous cholecystitis.  -CT abdomen/pelvis, MRCP confirmed acute cholecystitis without stones.   -Completed Augmentin course. -Clinically improved.  Normocytic anemia -Transfused with 1 unit of PRBC on 2/15 -FOBT checked and was  positive.   - EGD negative for any acute findings. Aspirin and Plavix resumed on 12/29/2022. -On Pepcid.   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  On Plavix, aspirin, Lipitor, Zetia at home.  Also on baclofen and Zanaflex for left-sided hemiplegia and spasticity   Depression -Celexa   Nutrition Problem: Severe Malnutrition Etiology: chronic illness, dysphagia (h/o CVA)  Medically stable to transfer to SNF.  Discharge Diagnoses:  Principal Problem:   Dysphagia as late effect of cerebrovascular accident (CVA) Active Problems:   Essential hypertension   Left hemiplegia (HCC)   History of stroke   Dysphagia   Protein-calorie malnutrition, severe   Decreased hemoglobin   Heme positive stool    Discharge Instructions  Discharge Instructions     Diet general   Complete by: As directed    Dysphagia 1 diet with thin liquids   Discharge instructions   Complete by: As directed    1)Please take prescribed medications as instructed 2)Do a CBC and BMP tests in a week   Discharge wound care:   Complete by: As directed    Protective dressing over bony prominences   Increase activity slowly   Complete by: As directed    Increase activity slowly   Complete by: As directed       Allergies as of 01/14/2023   No Known Allergies      Medication List     STOP taking these medications    aspirin EC 81 MG tablet Replaced by: aspirin 81 MG chewable tablet   diltiazem 240 MG 24 hr capsule Commonly known as: CARDIZEM CD       TAKE these medications    acetaminophen 325 MG tablet Commonly known as:  TYLENOL Take 1-2 tablets (325-650 mg total) by mouth every 4 (four) hours as needed for mild pain.   albuterol 108 (90 Base) MCG/ACT inhaler Commonly known as: VENTOLIN HFA Inhale 2 puffs into the lungs every 6 (six) hours as needed for wheezing or shortness of breath.   ALPRAZolam 0.25 MG tablet Commonly known as:  XANAX Take 2 tablets (0.5 mg total) by mouth daily as needed for anxiety. What changed: medication strength   aspirin 81 MG chewable tablet Place 1 tablet (81 mg total) into feeding tube daily. Replaces: aspirin EC 81 MG tablet   atorvastatin 80 MG tablet Commonly known as: LIPITOR Take 1 tablet (80 mg total) by mouth daily.   Baclofen 5 MG Tabs Take 1 tablet by mouth every 8 (eight) hours as needed (pain).   citalopram 10 MG tablet Commonly known as: CELEXA Take 10 mg by mouth every morning.   clopidogrel 75 MG tablet Commonly known as: PLAVIX Take 1 tablet (75 mg total) by mouth daily.   diphenoxylate-atropine 2.5-0.025 MG tablet Commonly known as: LOMOTIL Place 2 tablets into feeding tube 4 (four) times daily.   ezetimibe 10 MG tablet Commonly known as: ZETIA Take 10 mg by mouth daily.   famotidine 40 MG tablet Commonly known as: PEPCID Take 40 mg by mouth at bedtime.   feeding supplement (OSMOLITE 1.5 CAL) Liqd Place 1,000 mLs into feeding tube continuous.   feeding supplement Liqd Take 237 mLs by mouth 2 (two) times daily between meals.   feeding supplement (JEVITY 1.5 CAL/FIBER) Liqd Place 1,000 mLs into feeding tube daily. Start taking on: January 15, 2023   feeding supplement (PROSource TF20) liquid Place 60 mLs into feeding tube 3 (three) times daily between meals.   free water Soln Place 100 mLs into feeding tube every 4 (four) hours.   hydroxypropyl methylcellulose / hypromellose 2.5 % ophthalmic solution Commonly known as: ISOPTO TEARS / GONIOVISC Place 1 drop into both eyes in the morning and at bedtime.   hydrOXYzine 50 MG tablet Commonly known as: ATARAX Take 50 mg by mouth every 8 (eight) hours as needed for anxiety.   magnesium oxide 400 (240 Mg) MG tablet Commonly known as: MAG-OX Place 2 tablets (800 mg total) into feeding tube 2 (two) times daily.   melatonin 3 MG Tabs tablet Take 1 tablet (3 mg total) by mouth at bedtime.    mirtazapine 15 MG tablet Commonly known as: REMERON Take 15 mg by mouth at bedtime. What changed: Another medication with the same name was removed. Continue taking this medication, and follow the directions you see here.   multivitamin with minerals Tabs tablet Take 1 tablet by mouth daily.   polyethylene glycol 17 g packet Commonly known as: MIRALAX / GLYCOLAX Take 17 g by mouth daily as needed.   senna-docusate 8.6-50 MG tablet Commonly known as: Senokot-S Take 1 tablet by mouth 2 (two) times daily.   thiamine 100 MG tablet Commonly known as: Vitamin B-1 Take 100 mg by mouth daily.   tiZANidine 2 MG tablet Commonly known as: ZANAFLEX TAKE 1 TABLET(2 MG) BY MOUTH AT BEDTIME   traMADol 50 MG tablet Commonly known as: ULTRAM Take 1 tablet (50 mg total) by mouth every 6 (six) hours as needed for moderate pain.               Discharge Care Instructions  (From admission, onward)           Start     Ordered  01/14/23 0000  Discharge wound care:       Comments: Protective dressing over bony prominences   01/14/23 1243            No Known Allergies  Consultations: Gastroenterology Surgery    Procedures/Studies: CT ABDOMEN PELVIS W CONTRAST  Result Date: 12/24/2022 CLINICAL DATA:  Abdominal pain, acute, nonlocalized.  Diarrhea. EXAM: CT ABDOMEN AND PELVIS WITH CONTRAST TECHNIQUE: Multidetector CT imaging of the abdomen and pelvis was performed using the standard protocol following bolus administration of intravenous contrast. RADIATION DOSE REDUCTION: This exam was performed according to the departmental dose-optimization program which includes automated exposure control, adjustment of the mA and/or kV according to patient size and/or use of iterative reconstruction technique. CONTRAST:  168m OMNIPAQUE IOHEXOL 300 MG/ML  SOLN COMPARISON:  CT abdomen and pelvis 12/08/2022. MRI abdomen 12/08/2022 FINDINGS: Lower chest: Dependent atelectasis in the lung  bases. No pleural effusion. Normal heart size. Hepatobiliary: Unchanged 1.6 cm avidly enhancing lesion in the left hepatic lobe likely reflecting a hemangioma. Decreased intrahepatic and extrahepatic biliary dilatation with the common bile duct now measuring up to 9 mm in diameter. Decreased distension of the gallbladder with resolved pericholecystic inflammation. No calcified gallstones. Pancreas: Unremarkable. Spleen: Unremarkable. Adrenals/Urinary Tract: Unremarkable adrenal glands. Unchanged punctate calculus in the interpolar right kidney. No hydronephrosis or renal mass. Unremarkable bladder. Stomach/Bowel: A percutaneous gastrostomy tube remains in place. A small amount of chronic soft tissue extending inferiorly from the region of the pylorus is stable to slightly less prominent than on the prior study and could reflect the sequelae of prior ulcer disease or surgery. Fluid is present in the colon and rectum. There is no evidence of bowel obstruction. No bowel wall thickening or surrounding inflammation is evident. A small linear metallic density in the rectum likely represents a T tack. Prior appendectomy. Vascular/Lymphatic: Abdominal aortic atherosclerosis without aneurysm. Left external iliac artery stent. No enlarged lymph nodes. Reproductive: Uterus and bilateral adnexa are unremarkable. Other: No ascites or pneumoperitoneum. Musculoskeletal: Diffuse osteopenia. No acute osseous abnormality or suspicious osseous lesion. IMPRESSION: 1. Fluid in the colon and rectum consistent with the history of diarrhea. No bowel obstruction or inflammation identified. 2. Decreased intrahepatic and extrahepatic biliary dilatation. Decreased gallbladder distension with resolved pericholecystic inflammation. 3. Nonobstructing right renal calculus. 4. Unchanged left hepatic lesion compatible with a hemangioma. 5.  Aortic Atherosclerosis (ICD10-I70.0). Electronically Signed   By: ALogan BoresM.D.   On: 12/24/2022 16:17    (Echo, Carotid, EGD, Colonoscopy, ERCP)    Subjective: Patient seen in the morning rounds.  Denies any complaints.  She wants to walk.  Tolerating tube feeding.   Discharge Exam: Vitals:   01/13/23 2050 01/14/23 0429  BP: (!) 162/99 127/70  Pulse: 88 74  Resp: 14 14  Temp: 98.5 F (36.9 C) 98.5 F (36.9 C)  SpO2: 97% 99%   Vitals:   01/13/23 1348 01/13/23 2050 01/14/23 0428 01/14/23 0429  BP: (!) 141/77 (!) 162/99  127/70  Pulse: 78 88  74  Resp: '16 14  14  '$ Temp: 98.1 F (36.7 C) 98.5 F (36.9 C)  98.5 F (36.9 C)  TempSrc:  Oral  Oral  SpO2: 98% 97%  99%  Weight:   51.2 kg   Height:        General: Pt is alert, awake, not in acute distress Frail and debilitated.  Chronically sick looking.  Not in any distress.  Left dense hemiplegia.  Playing poker in her ipad. Cardiovascular: RRR, S1/S2 +, no  rubs, no gallops Respiratory: CTA bilaterally, no wheezing, no rhonchi Abdominal: Soft, NT, ND, bowel sounds +, PEG tube dry and clean.  Infusing. Extremities: no edema, no cyanosis    The results of significant diagnostics from this hospitalization (including imaging, microbiology, ancillary and laboratory) are listed below for reference.     Microbiology: No results found for this or any previous visit (from the past 240 hour(s)).   Labs: BNP (last 3 results) Recent Labs    11/29/22 1800  BNP 0000000   Basic Metabolic Panel: No results for input(s): "NA", "K", "CL", "CO2", "GLUCOSE", "BUN", "CREATININE", "CALCIUM", "MG", "PHOS" in the last 168 hours. Liver Function Tests: No results for input(s): "AST", "ALT", "ALKPHOS", "BILITOT", "PROT", "ALBUMIN" in the last 168 hours. No results for input(s): "LIPASE", "AMYLASE" in the last 168 hours. No results for input(s): "AMMONIA" in the last 168 hours. CBC: No results for input(s): "WBC", "NEUTROABS", "HGB", "HCT", "MCV", "PLT" in the last 168 hours. Cardiac Enzymes: No results for input(s): "CKTOTAL", "CKMB",  "CKMBINDEX", "TROPONINI" in the last 168 hours. BNP: Invalid input(s): "POCBNP" CBG: No results for input(s): "GLUCAP" in the last 168 hours. D-Dimer No results for input(s): "DDIMER" in the last 72 hours. Hgb A1c No results for input(s): "HGBA1C" in the last 72 hours. Lipid Profile No results for input(s): "CHOL", "HDL", "LDLCALC", "TRIG", "CHOLHDL", "LDLDIRECT" in the last 72 hours. Thyroid function studies No results for input(s): "TSH", "T4TOTAL", "T3FREE", "THYROIDAB" in the last 72 hours.  Invalid input(s): "FREET3" Anemia work up No results for input(s): "VITAMINB12", "FOLATE", "FERRITIN", "TIBC", "IRON", "RETICCTPCT" in the last 72 hours. Urinalysis    Component Value Date/Time   COLORURINE AMBER (A) 11/30/2022 0509   APPEARANCEUR CLOUDY (A) 11/30/2022 0509   LABSPEC 1.024 11/30/2022 0509   PHURINE 5.0 11/30/2022 0509   GLUCOSEU NEGATIVE 11/30/2022 0509   HGBUR MODERATE (A) 11/30/2022 0509   BILIRUBINUR SMALL (A) 11/30/2022 0509   BILIRUBINUR small 06/15/2014 1038   KETONESUR 20 (A) 11/30/2022 0509   PROTEINUR 30 (A) 11/30/2022 0509   UROBILINOGEN 0.2 06/15/2014 1038   UROBILINOGEN 1.0 05/04/2013 1637   NITRITE NEGATIVE 11/30/2022 0509   LEUKOCYTESUR MODERATE (A) 11/30/2022 0509   Sepsis Labs No results for input(s): "WBC" in the last 168 hours.  Invalid input(s): "PROCALCITONIN", "LACTICIDVEN" Microbiology No results found for this or any previous visit (from the past 240 hour(s)).   Time coordinating discharge: 35  minutes  SIGNED:   Barb Merino, MD  [facility] 01/14/2023, 12:43 PM

## 2023-01-14 NOTE — TOC Progression Note (Signed)
Transition of Care [facility]) - Progression Note    Patient Details  Name: DAI DEMONBREUN MRN: [id number] Date of Birth: 06-May-1956  Transition of Care [facility]) CM/SW Contact  Joaquin Courts, RN Phone Number: 01/14/2023, 11:22 AM  Clinical Narrative:    CM spoke with patient's daughter who accepts [facility] bed offer.  CM is working with [facility] rep to secure bed.  [employer] [facility]auf[facility] has previously been denied for this patient.   Expected Discharge Plan: Skilled Nursing Facility Barriers to Discharge: SNF Authorization Denied  Expected Discharge Plan and Services In-house Referral: NA Discharge Planning Services: CM Consult Post Acute Care Choice: [place] Living arrangements for the past 2 months: Apartment Expected Discharge Date: 12/07/22               DME Arranged: N/A DME Agency: NA       HH Arranged: NA HH Agency: NA         Social Determinants of Health (SDOH) Interventions SDOH Screenings   Food Insecurity: Unknown (12/04/2022)  Housing: Low Risk  (12/04/2022)  Transportation Needs: Unknown (12/04/2022)  Utilities: Unknown (12/04/2022)  Depression (PHQ2-9): High Risk (09/19/2022)  Tobacco Use: High Risk (12/30/2022)    Readmission Risk Interventions    12/17/2022    1:42 PM  Readmission Risk Prevention Plan  Transportation Screening Complete  PCP or Specialist Appt within 3-5 Days Complete  HRI or [place] Complete  Social Work Consult for [place] Planning/Counseling [place] Not Applicable  Medication Review Press photographer) Referral to Pharmacy

## 2023-01-14 NOTE — TOC Transition Note (Signed)
Transition of Care [facility]) - CM/SW Discharge Note   Patient Details  Name: Kara Bailey MRN: [id number] Date of Birth: 11-24-1955  Transition of Care [facility]) CM/SW Contact:  Joaquin Courts, RN Phone Number: 01/14/2023, 12:50 PM   Clinical Narrative:    Patient will discharge to room B6-2 at [facility], report number [id number].  [facility] transportation arranged. No further TOC needs identified.     Barriers to Discharge: SNF Authorization Denied   Patient Goals and CMS Choice CMS Medicare.gov Compare Post Acute Care list provided to:: Patient (son) Choice offered to / list presented to : Patient, Adult Children  Discharge Placement                         Discharge Plan and Services Additional resources added to the After Visit Summary for   In-house Referral: NA Discharge Planning Services: CM Consult Post Acute Care Choice: [place]          DME Arranged: N/A DME Agency: NA       HH Arranged: NA HH Agency: NA        Social Determinants of Health (SDOH) Interventions SDOH Screenings   Food Insecurity: Unknown (12/04/2022)  Housing: Low Risk  (12/04/2022)  Transportation Needs: Unknown (12/04/2022)  Utilities: Unknown (12/04/2022)  Depression (PHQ2-9): High Risk (09/19/2022)  Tobacco Use: High Risk (12/30/2022)     Readmission Risk Interventions    12/17/2022    1:42 PM  Readmission Risk Prevention Plan  Transportation Screening Complete  PCP or Specialist Appt within 3-5 Days Complete  HRI or [place] Complete  Social Work Consult for [place] Planning/Counseling [place] Not Applicable  Medication Review Press photographer) Referral to Pharmacy

## 2023-02-05 ENCOUNTER — Encounter (HOSPITAL_COMMUNITY): Payer: Self-pay | Admitting: Emergency Medicine

## 2023-02-05 ENCOUNTER — Emergency Department (HOSPITAL_COMMUNITY)
Admission: EM | Admit: 2023-02-05 | Discharge: 2023-02-05 | Payer: Medicare HMO | Attending: Emergency Medicine | Admitting: Emergency Medicine

## 2023-02-05 ENCOUNTER — Emergency Department (HOSPITAL_COMMUNITY): Payer: Medicare HMO

## 2023-02-05 DIAGNOSIS — I10 Essential (primary) hypertension: Secondary | ICD-10-CM | POA: Insufficient documentation

## 2023-02-05 DIAGNOSIS — Z7902 Long term (current) use of antithrombotics/antiplatelets: Secondary | ICD-10-CM | POA: Diagnosis not present

## 2023-02-05 DIAGNOSIS — Z79899 Other long term (current) drug therapy: Secondary | ICD-10-CM | POA: Insufficient documentation

## 2023-02-05 DIAGNOSIS — D75839 Thrombocytosis, unspecified: Secondary | ICD-10-CM | POA: Diagnosis not present

## 2023-02-05 DIAGNOSIS — R1084 Generalized abdominal pain: Secondary | ICD-10-CM | POA: Insufficient documentation

## 2023-02-05 DIAGNOSIS — R112 Nausea with vomiting, unspecified: Secondary | ICD-10-CM | POA: Insufficient documentation

## 2023-02-05 DIAGNOSIS — Z7982 Long term (current) use of aspirin: Secondary | ICD-10-CM | POA: Insufficient documentation

## 2023-02-05 DIAGNOSIS — D72829 Elevated white blood cell count, unspecified: Secondary | ICD-10-CM | POA: Diagnosis not present

## 2023-02-05 DIAGNOSIS — R7309 Other abnormal glucose: Secondary | ICD-10-CM

## 2023-02-05 LAB — CBC WITH DIFFERENTIAL/PLATELET
Abs Immature Granulocytes: 0.1 10*3/uL — ABNORMAL HIGH (ref 0.00–0.07)
Basophils Absolute: 0 10*3/uL (ref 0.0–0.1)
Basophils Relative: 0 %
Eosinophils Absolute: 0 10*3/uL (ref 0.0–0.5)
Eosinophils Relative: 0 %
HCT: 40.2 % (ref 36.0–46.0)
Hemoglobin: 12.9 g/dL (ref 12.0–15.0)
Immature Granulocytes: 1 %
Lymphocytes Relative: 7 %
Lymphs Abs: 1.4 10*3/uL (ref 0.7–4.0)
MCH: 28.8 pg (ref 26.0–34.0)
MCHC: 32.1 g/dL (ref 30.0–36.0)
MCV: 89.7 fL (ref 80.0–100.0)
Monocytes Absolute: 0.8 10*3/uL (ref 0.1–1.0)
Monocytes Relative: 4 %
Neutro Abs: 18.3 10*3/uL — ABNORMAL HIGH (ref 1.7–7.7)
Neutrophils Relative %: 88 %
Platelets: 428 10*3/uL — ABNORMAL HIGH (ref 150–400)
RBC: 4.48 MIL/uL (ref 3.87–5.11)
RDW: 14.9 % (ref 11.5–15.5)
WBC: 20.6 10*3/uL — ABNORMAL HIGH (ref 4.0–10.5)
nRBC: 0 % (ref 0.0–0.2)

## 2023-02-05 LAB — COMPREHENSIVE METABOLIC PANEL
ALT: 28 U/L (ref 0–44)
AST: 25 U/L (ref 15–41)
Albumin: 4.2 g/dL (ref 3.5–5.0)
Alkaline Phosphatase: 82 U/L (ref 38–126)
Anion gap: 11 (ref 5–15)
BUN: 24 mg/dL — ABNORMAL HIGH (ref 8–23)
CO2: 26 mmol/L (ref 22–32)
Calcium: 10.1 mg/dL (ref 8.9–10.3)
Chloride: 101 mmol/L (ref 98–111)
Creatinine, Ser: 1.01 mg/dL — ABNORMAL HIGH (ref 0.44–1.00)
GFR, Estimated: >60 mL/min (ref >60)
Glucose, Bld: 148 mg/dL — ABNORMAL HIGH (ref 70–99)
Potassium: 4.3 mmol/L (ref 3.5–5.1)
Sodium: 138 mmol/L (ref 135–145)
Total Bilirubin: 0.5 mg/dL (ref 0.3–1.2)
Total Protein: 8.4 g/dL — ABNORMAL HIGH (ref 6.5–8.1)

## 2023-02-05 LAB — LIPASE, BLOOD: Lipase: 36 U/L (ref 11–51)

## 2023-02-05 MED ORDER — IOHEXOL 300 MG/ML  SOLN
100.0000 mL | Freq: Once | INTRAMUSCULAR | Status: AC | PRN
  Administered 2023-02-05: 100 mL via INTRAVENOUS

## 2023-02-05 MED ORDER — SODIUM CHLORIDE 0.9 % IV BOLUS
500.0000 mL | Freq: Once | INTRAVENOUS | Status: AC
  Administered 2023-02-05: 500 mL via INTRAVENOUS

## 2023-02-05 MED ORDER — ONDANSETRON 8 MG PO TBDP: 8.0000 mg | ORAL_TABLET | Freq: Three times a day (TID) | ORAL | 0 refills | Status: DC | PRN

## 2023-02-05 NOTE — ED Provider Notes (Signed)
[place] [facility] Provider Note   CSN: [id number] Arrival date & time: 02/05/23  0022     History  Chief Complaint  Patient presents with   Emesis    Kara Bailey is a 67 y.o. female.  The history is provided by the patient and the nursing home.  Emesis She has history of hypertension, cirrhosis, stroke, emphysema, GERD and was sent here from nursing home because of vomiting.  She is reported to be vomiting every time they try to feed her through her PEG tube.  Patient also endorses generalized abdominal pain.   Home Medications Prior to Admission medications   Medication Sig Start Date End Date Taking? Authorizing Provider  acetaminophen (TYLENOL) 325 MG tablet Take 1-2 tablets (325-650 mg total) by mouth every 4 (four) hours as needed for mild pain. Patient not taking: Reported on 11/29/2022 09/06/22   Barbie Banner, PA-C  albuterol (VENTOLIN HFA) 108 (90 Base) MCG/ACT inhaler Inhale 2 puffs into the lungs every 6 (six) hours as needed for wheezing or shortness of breath.    [provider]  ALPRAZolam Duanne Moron) 0.25 MG tablet Take 2 tablets (0.5 mg total) by mouth daily as needed for anxiety. 12/07/22   Shelly Coss, MD  aspirin 81 MG chewable tablet Place 1 tablet (81 mg total) into feeding tube daily. 12/17/22   Florencia Reasons, MD  atorvastatin (LIPITOR) 80 MG tablet Take 1 tablet (80 mg total) by mouth daily. 09/06/22   Setzer, [address], PA-C  Baclofen 5 MG TABS Take 1 tablet by mouth every 8 (eight) hours as needed (pain). 11/24/22   [provider]  citalopram (CELEXA) 10 MG tablet Take 10 mg by mouth every morning. 10/31/22   [provider]  clopidogrel (PLAVIX) 75 MG tablet Take 1 tablet (75 mg total) by mouth daily. 12/20/22   Lorretta Harp, MD  diphenoxylate-atropine (LOMOTIL) 2.5-0.025 MG tablet Place 2 tablets into feeding tube 4 (four) times daily. 01/14/23   Barb Merino, MD  ezetimibe (ZETIA) 10 MG  tablet Take 10 mg by mouth daily.    [provider]  famotidine (PEPCID) 40 MG tablet Take 40 mg by mouth at bedtime. 10/25/20   [provider]  feeding supplement (ENSURE ENLIVE / ENSURE PLUS) LIQD Take 237 mLs by mouth 2 (two) times daily between meals. 12/07/22   Shelly Coss, MD  hydroxypropyl methylcellulose / hypromellose (ISOPTO TEARS / GONIOVISC) 2.5 % ophthalmic solution Place 1 drop into both eyes in the morning and at bedtime.    [provider]  hydrOXYzine (ATARAX) 50 MG tablet Take 50 mg by mouth every 8 (eight) hours as needed for anxiety.    [provider]  magnesium oxide (MAG-OX) 400 (240 Mg) MG tablet Place 2 tablets (800 mg total) into feeding tube 2 (two) times daily. 12/07/22   Shelly Coss, MD  melatonin 3 MG TABS tablet Take 1 tablet (3 mg total) by mouth at bedtime. Patient not taking: Reported on 11/29/2022 09/06/22   Barbie Banner, PA-C  mirtazapine (REMERON) 15 MG tablet Take 15 mg by mouth at bedtime. 09/27/22   [provider]  Multiple Vitamin (MULTIVITAMIN WITH MINERALS) TABS tablet Take 1 tablet by mouth daily. 12/31/20   Debbe Odea, MD  Nutritional Supplements (FEEDING SUPPLEMENT, JEVITY 1.5 CAL/FIBER,) LIQD Place 1,000 mLs into feeding tube daily. 01/15/23   Barb Merino, MD  Nutritional Supplements (FEEDING SUPPLEMENT, OSMOLITE 1.5 CAL,) LIQD Place 1,000 mLs into feeding  tube continuous. 12/07/22   Shelly Coss, MD  polyethylene glycol (MIRALAX / GLYCOLAX) 17 g packet Take 17 g by mouth daily as needed. Patient not taking: Reported on 11/29/2022 09/06/22   Barbie Banner, PA-C  Protein (FEEDING SUPPLEMENT, PROSOURCE TF20,) liquid Place 60 mLs into feeding tube 3 (three) times daily between meals. 01/14/23   Barb Merino, MD  senna-docusate (SENOKOT-S) 8.6-50 MG tablet Take 1 tablet by mouth 2 (two) times daily. Patient not taking: Reported on 11/29/2022 09/06/22   Barbie Banner, PA-C  thiamine (VITAMIN  B-1) 100 MG tablet Take 100 mg by mouth daily.    [provider]  tiZANidine (ZANAFLEX) 2 MG tablet TAKE 1 TABLET(2 MG) BY MOUTH AT BEDTIME Patient not taking: Reported on 11/29/2022 10/15/22   Charlett Blake, MD  traMADol (ULTRAM) 50 MG tablet Take 1 tablet (50 mg total) by mouth every 6 (six) hours as needed for moderate pain. 12/07/22   Shelly Coss, MD  Water For Irrigation, Sterile (FREE WATER) SOLN Place 100 mLs into feeding tube every 4 (four) hours. 12/07/22   Shelly Coss, MD      Allergies    Patient has no known allergies.    Review of Systems   Review of Systems  Gastrointestinal:  Positive for vomiting.  All other systems reviewed and are negative.   Physical Exam Updated Vital Signs BP (!) 174/90   Pulse (!) 112   Resp 19   Ht 5\' 6"  (1.676 m)   Wt 51 kg   SpO2 99%   BMI 18.15 kg/m  Physical Exam Vitals and nursing note reviewed.   Somewhat cachectic 67 year old female, resting comfortably and in no acute distress. Vital signs are significant for elevated heart rate and blood pressure. Oxygen saturation is 99%, which is normal. Head is normocephalic and atraumatic. PERRLA, EOMI. Oropharynx is clear. Neck is nontender and supple without adenopathy or JVD. Back is nontender and there is no CVA tenderness. Lungs are clear without rales, wheezes, or rhonchi. Chest is nontender. Heart has regular rate and rhythm without murmur. Abdomen is soft, flat, with mild tenderness diffusely.  There is no rebound or guarding.  PEG tube is in place. Extremities have no cyanosis or edema. Skin is warm and dry without rash. Neurologic: Awake and alert.  ED Results / Procedures / Treatments   Labs (all labs ordered are listed, but only abnormal results are displayed) Labs Reviewed  COMPREHENSIVE METABOLIC PANEL - Abnormal; Notable for the following components:      Result Value   Glucose, Bld 148 (*)    BUN 24 (*)    Creatinine, Ser 1.01 (*)    Total  Protein 8.4 (*)    All other components within normal limits  CBC WITH DIFFERENTIAL/PLATELET - Abnormal; Notable for the following components:   WBC 20.6 (*)    Platelets 428 (*)    Neutro Abs 18.3 (*)    Abs Immature Granulocytes 0.10 (*)    All other components within normal limits  LIPASE, BLOOD   Radiology CT ABDOMEN PELVIS W CONTRAST  Result Date: 02/05/2023 CLINICAL DATA:  Bowel obstruction suspected. Vomiting after G-tube usage. EXAM: CT ABDOMEN AND PELVIS WITH CONTRAST TECHNIQUE: Multidetector CT imaging of the abdomen and pelvis was performed using the standard protocol following bolus administration of intravenous contrast. RADIATION DOSE REDUCTION: This exam was performed according to the departmental dose-optimization program which includes automated exposure control, adjustment of the mA and/or kV according to patient size  and/or use of iterative reconstruction technique. CONTRAST:  150mL OMNIPAQUE IOHEXOL 300 MG/ML  SOLN COMPARISON:  12/24/2022 FINDINGS: Lower chest:  Small sliding hiatal hernia. Hepatobiliary: Left lobe liver mass measuring 17 mm, enhancement pattern and MRI most consistent with hemangioma. No evidence of cholecystitis or stone. Biliary dilatation evaluated by MRCP 2 months ago. Pancreas: Unremarkable. Spleen: Unremarkable. Adrenals/Urinary Tract: Negative adrenals. No hydronephrosis or ureteral stone. Punctate right renal calculus. Unremarkable bladder. Stomach/Bowel: No obstruction. Normally located percutaneous gastrostomy tube. No evidence of bowel inflammation. Vascular/Lymphatic: No acute vascular abnormality. Severe atheromatous wall thickening of the aorta and iliacs. High-grade narrowing at the left external iliac/common femoral artery suspected just beyond a external iliac stent. No mass or adenopathy. Reproductive:No pathologic findings. Other: No ascites or pneumoperitoneum. Musculoskeletal: No acute abnormalities. IMPRESSION: No acute finding. No bowel  obstruction or visible inflammation. The percutaneous gastrostomy tube is normally located. Electronically Signed   By: Jorje Guild M.D.   On: 02/05/2023 06:19    Procedures Procedures    Medications Ordered in ED Medications  sodium chloride 0.9 % bolus 500 mL (has no administration in time range)    ED Course/ Medical Decision Making/ A&P                             Medical Decision Making Amount and/or Complexity of Data Reviewed Labs: ordered. Radiology: ordered.  Risk Prescription drug management.   Vomiting in patient with PEG tube.  Consider viral gastritis, concern for possible small bowel obstruction.  I have ordered screening labs of CBC, comprehensive metabolic panel, lipase and I have ordered IV fluids.  I have ordered CT of abdomen and pelvis to look for evidence of bowel obstruction.  I have reviewed her past records, and she had been admitted on 11/29/2022 with dysphagia with G-tube placement.  CT scan shows no acute process, appropriate placement of PEG tube.  I have independently viewed the images, and agree with the radiologist's interpretation.  I have reviewed and interpreted her laboratory test, and my interpretation is slight increase in creatinine compared with baseline but with stable BUN, moderate leukocytosis with left shift and a mild thrombocytosis which is likely reactive.  I am concerned about the leukocytosis, but no evidence of significant pathology on evaluation here, no indication for hospital admission.  I am discharging her with prescription for ondansetron for nausea, but advised to return if symptoms or not being adequately controlled or if she has any new or concerning symptoms.  Final Clinical Impression(s) / ED Diagnoses Final diagnoses:  Nausea and vomiting, unspecified vomiting type  Leukocytosis, unspecified type  Elevated random blood glucose level  Thrombocytosis    Rx / DC Orders ED Discharge Orders          Ordered     ondansetron (ZOFRAN-ODT) 8 MG disintegrating tablet  Every 8 hours PRN        02/05/23 [id number]              Delora Fuel, MD [id number] [phone]

## 2023-02-05 NOTE — ED Notes (Addendum)
No emesis from pt since being brought to ed

## 2023-02-05 NOTE — Discharge Instructions (Signed)
Your CT scan did not show any blockage in the bowel or any other acute process.  However, your white blood cell count is quite high.  If you have recurrence of vomiting or if pain is getting worse or if you develop the fever, please return to the emergency department for further evaluation.

## 2023-02-05 NOTE — ED Notes (Signed)
Pt HOB adjusted.

## 2023-02-05 NOTE — ED Triage Notes (Signed)
PT BIB EMS from [facility] for vomiting for past several hours. PT has G-tube and has vomited everytime is has been used this evening. PT is non verbal but will non head. Per EMS pt was hypertensive at facility with systolic pressure [id number]. BP from EMS was 192/80.

## 2023-02-26 ENCOUNTER — Encounter (HOSPITAL_COMMUNITY): Payer: Self-pay

## 2023-03-20 ENCOUNTER — Other Ambulatory Visit: Payer: Self-pay

## 2023-03-20 ENCOUNTER — Emergency Department (HOSPITAL_COMMUNITY)
Admission: EM | Admit: 2023-03-20 | Discharge: 2023-03-21 | Disposition: A | Discharge status: Home / Self Care | Payer: Medicare HMO | Attending: Emergency Medicine | Admitting: Emergency Medicine

## 2023-03-20 ENCOUNTER — Encounter (HOSPITAL_COMMUNITY): Payer: Self-pay | Admitting: Emergency Medicine

## 2023-03-20 DIAGNOSIS — Z7982 Long term (current) use of aspirin: Secondary | ICD-10-CM | POA: Diagnosis not present

## 2023-03-20 DIAGNOSIS — Z7902 Long term (current) use of antithrombotics/antiplatelets: Secondary | ICD-10-CM | POA: Diagnosis not present

## 2023-03-20 DIAGNOSIS — Z79899 Other long term (current) drug therapy: Secondary | ICD-10-CM | POA: Insufficient documentation

## 2023-03-20 DIAGNOSIS — I10 Essential (primary) hypertension: Secondary | ICD-10-CM | POA: Insufficient documentation

## 2023-03-20 DIAGNOSIS — R21 Rash and other nonspecific skin eruption: Secondary | ICD-10-CM | POA: Insufficient documentation

## 2023-03-20 LAB — COMPREHENSIVE METABOLIC PANEL
ALT: 21 U/L (ref 0–44)
AST: 22 U/L (ref 15–41)
Albumin: 3.5 g/dL (ref 3.5–5.0)
Alkaline Phosphatase: 60 U/L (ref 38–126)
Anion gap: 8 (ref 5–15)
BUN: 23 mg/dL (ref 8–23)
CO2: 27 mmol/L (ref 22–32)
Calcium: 9.3 mg/dL (ref 8.9–10.3)
Chloride: 105 mmol/L (ref 98–111)
Creatinine, Ser: 1 mg/dL (ref 0.44–1.00)
GFR, Estimated: >60 mL/min (ref >60)
Glucose, Bld: 90 mg/dL (ref 70–99)
Potassium: 3.6 mmol/L (ref 3.5–5.1)
Sodium: 140 mmol/L (ref 135–145)
Total Bilirubin: 0.3 mg/dL (ref 0.3–1.2)
Total Protein: 7.1 g/dL (ref 6.5–8.1)

## 2023-03-20 LAB — CBC WITH DIFFERENTIAL/PLATELET
Abs Immature Granulocytes: 0 10*3/uL (ref 0.00–0.07)
Basophils Absolute: 0 10*3/uL (ref 0.0–0.1)
Basophils Relative: 0 %
Eosinophils Absolute: 2 10*3/uL — ABNORMAL HIGH (ref 0.0–0.5)
Eosinophils Relative: 18 %
HCT: 34.7 % — ABNORMAL LOW (ref 36.0–46.0)
Hemoglobin: 11.2 g/dL — ABNORMAL LOW (ref 12.0–15.0)
Lymphocytes Relative: 23 %
Lymphs Abs: 2.6 10*3/uL (ref 0.7–4.0)
MCH: 28.6 pg (ref 26.0–34.0)
MCHC: 32.3 g/dL (ref 30.0–36.0)
MCV: 88.7 fL (ref 80.0–100.0)
Monocytes Absolute: 0.8 10*3/uL (ref 0.1–1.0)
Monocytes Relative: 7 %
Neutro Abs: 5.9 10*3/uL (ref 1.7–7.7)
Neutrophils Relative %: 52 %
Platelets: 240 10*3/uL (ref 150–400)
RBC: 3.91 MIL/uL (ref 3.87–5.11)
RDW: 15 % (ref 11.5–15.5)
WBC: 11.3 10*3/uL — ABNORMAL HIGH (ref 4.0–10.5)
nRBC: 0 % (ref 0.0–0.2)

## 2023-03-20 MED ORDER — PREDNISONE 10 MG PO TABS: 20.0000 mg | ORAL_TABLET | Freq: Every day | ORAL | 0 refills | Status: DC

## 2023-03-20 MED ORDER — OXYCODONE-ACETAMINOPHEN 5-325 MG PO TABS
1.0000 | ORAL_TABLET | Freq: Once | ORAL | Status: AC
  Administered 2023-03-20: 1 via ORAL
  Filled 2023-03-20: qty 1

## 2023-03-20 MED ORDER — METHYLPREDNISOLONE SODIUM SUCC 125 MG IJ SOLR
125.0000 mg | Freq: Once | INTRAMUSCULAR | Status: AC
  Administered 2023-03-20: 125 mg via INTRAMUSCULAR
  Filled 2023-03-20: qty 2

## 2023-03-20 MED ORDER — SILVER SULFADIAZINE 1 % EX CREA
TOPICAL_CREAM | Freq: Once | CUTANEOUS | Status: AC
  Filled 2023-03-20: qty 50

## 2023-03-20 MED ORDER — DOXYCYCLINE HYCLATE 100 MG PO TABS
100.0000 mg | ORAL_TABLET | Freq: Once | ORAL | Status: AC
  Administered 2023-03-20: 100 mg via ORAL
  Filled 2023-03-20: qty 1

## 2023-03-20 MED ORDER — PREDNISONE 10 MG PO TABS: 40.0000 mg | ORAL_TABLET | Freq: Every day | ORAL | 0 refills | Status: DC

## 2023-03-20 MED ORDER — DIPHENHYDRAMINE HCL 25 MG PO CAPS
25.0000 mg | ORAL_CAPSULE | Freq: Once | ORAL | Status: AC
  Administered 2023-03-20: 25 mg via ORAL
  Filled 2023-03-20: qty 1

## 2023-03-20 MED ORDER — DOXYCYCLINE HYCLATE 100 MG PO CAPS: 100.0000 mg | ORAL_CAPSULE | Freq: Two times a day (BID) | ORAL | 0 refills | Status: AC

## 2023-03-20 MED ORDER — PREDNISONE 10 MG PO TABS: 20.0000 mg | ORAL_TABLET | Freq: Every day | ORAL | 0 refills | Status: AC

## 2023-03-20 MED ORDER — HYDROCODONE-ACETAMINOPHEN 5-325 MG PO TABS: 2.0000 | ORAL_TABLET | ORAL | 0 refills | Status: DC | PRN

## 2023-03-20 MED ORDER — DOXYCYCLINE HYCLATE 100 MG PO CAPS: 100.0000 mg | ORAL_CAPSULE | Freq: Two times a day (BID) | ORAL | 0 refills | Status: DC

## 2023-03-20 NOTE — Discharge Instructions (Signed)
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  Starting you on a course of antibiotics and steroids.  The antibiotic is doxycycline and steroid is prednisone.  I have sent this medication to your pharmacy.  Also reach out to Dr. Darlin Drop who is a dermatologist in the [facility].  She has agreed to see you in clinic.  Her office will contact you soon to schedule appointment.

## 2023-03-20 NOTE — ED Triage Notes (Signed)
Pt has blistering to the right arm for a couple of weeks.

## 2023-03-20 NOTE — ED Notes (Signed)
Spoke with Ladene Artist at [facility] and he is aware of the discharge instructions and prescriptions.

## 2023-03-20 NOTE — ED Provider Notes (Signed)
[place] EMERGENCY DEPARTMENT AT [facility] Provider Note   CSN: [id number] Arrival date & time: 03/20/23  1448     History {Add pertinent medical, surgical, social history, OB history to HPI:1} Chief Complaint  Patient presents with   Wound Check    Kara Bailey is a 67 y.o. female.   Wound Check   67 year old female presents emergency department complaints of rash.  Patient states the rash has been present for the past 1 to 2 weeks.  Patient states at baseline, has scabbed lesions over all 4 extremities due to picking at skin from itching sensation.  States that she noticed pus like areas formed over the past 2 weeks.  States has been getting daily wound care at living facility.  Denies fever, new medications, known allergen exposure.  States areas are painful.  Past medical history significant for CVA with left hemiplegia, shingles, emphysema of lung, GERD, hepatitis C, hypertension, PAD, generalized anxiety disorder, hypertension, alcohol abuse  Home Medications Prior to Admission medications   Medication Sig Start Date End Date Taking? Authorizing Provider  acetaminophen (TYLENOL) 325 MG tablet Take 1-2 tablets (325-650 mg total) by mouth every 4 (four) hours as needed for mild pain. Patient not taking: Reported on 11/29/2022 09/06/22   Milinda Antis, PA-C  albuterol (VENTOLIN HFA) 108 (90 Base) MCG/ACT inhaler Inhale 2 puffs into the lungs every 6 (six) hours as needed for wheezing or shortness of breath.    [provider]  ALPRAZolam Prudy Feeler) 0.25 MG tablet Take 2 tablets (0.5 mg total) by mouth daily as needed for anxiety. 12/07/22   Burnadette Pop, MD  aspirin 81 MG chewable tablet Place 1 tablet (81 mg total) into feeding tube daily. 12/17/22   Albertine Grates, MD  atorvastatin (LIPITOR) 80 MG tablet Take 1 tablet (80 mg total) by mouth daily. 09/06/22   Setzer, Lynnell Jude, PA-C  Baclofen 5 MG TABS Take 1 tablet by mouth every 8 (eight) hours as needed  (pain). 11/24/22   [provider]  citalopram (CELEXA) 10 MG tablet Take 10 mg by mouth every morning. 10/31/22   [provider]  clopidogrel (PLAVIX) 75 MG tablet Take 1 tablet (75 mg total) by mouth daily. 12/20/22   Runell Gess, MD  diphenoxylate-atropine (LOMOTIL) 2.5-0.025 MG tablet Place 2 tablets into feeding tube 4 (four) times daily. 01/14/23   Dorcas Carrow, MD  ezetimibe (ZETIA) 10 MG tablet Take 10 mg by mouth daily.    [provider]  famotidine (PEPCID) 40 MG tablet Take 40 mg by mouth at bedtime. 10/25/20   [provider]  feeding supplement (ENSURE ENLIVE / ENSURE PLUS) LIQD Take 237 mLs by mouth 2 (two) times daily between meals. 12/07/22   Burnadette Pop, MD  hydroxypropyl methylcellulose / hypromellose (ISOPTO TEARS / GONIOVISC) 2.5 % ophthalmic solution Place 1 drop into both eyes in the morning and at bedtime.    [provider]  hydrOXYzine (ATARAX) 50 MG tablet Take 50 mg by mouth every 8 (eight) hours as needed for anxiety.    [provider]  magnesium oxide (MAG-OX) 400 (240 Mg) MG tablet Place 2 tablets (800 mg total) into feeding tube 2 (two) times daily. 12/07/22   Burnadette Pop, MD  melatonin 3 MG TABS tablet Take 1 tablet (3 mg total) by mouth at bedtime. Patient not taking: Reported on 11/29/2022 09/06/22   Milinda Antis, PA-C  mirtazapine (REMERON) 15 MG tablet Take 15 mg by mouth at  bedtime. 09/27/22   [provider]  Multiple Vitamin (MULTIVITAMIN WITH MINERALS) TABS tablet Take 1 tablet by mouth daily. 12/31/20   Calvert Cantor, MD  Nutritional Supplements (FEEDING SUPPLEMENT, JEVITY 1.5 CAL/FIBER,) LIQD Place 1,000 mLs into feeding tube daily. 01/15/23   Dorcas Carrow, MD  Nutritional Supplements (FEEDING SUPPLEMENT, OSMOLITE 1.5 CAL,) LIQD Place 1,000 mLs into feeding tube continuous. 12/07/22   Burnadette Pop, MD  ondansetron (ZOFRAN-ODT) 8 MG disintegrating tablet Take 1 tablet (8 mg total)  by mouth every 8 (eight) hours as needed for nausea or vomiting. 02/05/23   Dione Booze, MD  polyethylene glycol (MIRALAX / GLYCOLAX) 17 g packet Take 17 g by mouth daily as needed. Patient not taking: Reported on 11/29/2022 09/06/22   Milinda Antis, PA-C  Protein (FEEDING SUPPLEMENT, PROSOURCE TF20,) liquid Place 60 mLs into feeding tube 3 (three) times daily between meals. 01/14/23   Dorcas Carrow, MD  senna-docusate (SENOKOT-S) 8.6-50 MG tablet Take 1 tablet by mouth 2 (two) times daily. Patient not taking: Reported on 11/29/2022 09/06/22   Milinda Antis, PA-C  thiamine (VITAMIN B-1) 100 MG tablet Take 100 mg by mouth daily.    [provider]  tiZANidine (ZANAFLEX) 2 MG tablet TAKE 1 TABLET(2 MG) BY MOUTH AT BEDTIME Patient not taking: Reported on 11/29/2022 10/15/22   Erick Colace, MD  traMADol (ULTRAM) 50 MG tablet Take 1 tablet (50 mg total) by mouth every 6 (six) hours as needed for moderate pain. 12/07/22   Burnadette Pop, MD  Water For Irrigation, Sterile (FREE WATER) SOLN Place 100 mLs into feeding tube every 4 (four) hours. 12/07/22   Burnadette Pop, MD      Allergies    Patient has no known allergies.    Review of Systems   Review of Systems  All other systems reviewed and are negative.   Physical Exam Updated Vital Signs BP (!) 150/72   Pulse 84   Temp 98.4 F (36.9 C)   Resp 18   Ht 5\' 6"  (1.676 m)   Wt 51 kg   SpO2 96%   BMI 18.15 kg/m  Physical Exam Vitals and nursing note reviewed.  Constitutional:      General: She is not in acute distress.    Appearance: She is well-developed.  HENT:     Head: Normocephalic and atraumatic.  Eyes:     Conjunctiva/sclera: Conjunctivae normal.  Cardiovascular:     Rate and Rhythm: Normal rate and regular rhythm.     Heart sounds: No murmur heard. Pulmonary:     Effort: Pulmonary effort is normal. No respiratory distress.     Breath sounds: Normal breath sounds.  Abdominal:     Palpations: Abdomen is  soft.     Tenderness: There is no abdominal tenderness.  Musculoskeletal:        General: No swelling.     Cervical back: Neck supple.  Skin:    General: Skin is warm and dry.     Capillary Refill: Capillary refill takes less than 2 seconds.  Neurological:     Mental Status: She is alert.  Psychiatric:        Mood and Affect: Mood normal.        ED Results / Procedures / Treatments   Labs (all labs ordered are listed, but only abnormal results are displayed) Labs Reviewed  BASIC METABOLIC PANEL  CBC WITH DIFFERENTIAL/PLATELET    EKG None  Radiology No results found.  Procedures Procedures  {Document cardiac  monitor, telemetry assessment procedure when appropriate:1}  Medications Ordered in ED Medications  oxyCODONE-acetaminophen (PERCOCET/ROXICET) 5-325 MG per tablet 1 tablet (has no administration in time range)    ED Course/ Medical Decision Making/ A&P   {   Click here for ABCD2, HEART and other calculatorsREFRESH Note before signing :1}                          Medical Decision Making Amount and/or Complexity of Data Reviewed Labs: ordered.  Risk Prescription drug management.   This patient presents to the ED for concern of ***, this involves an extensive number of treatment options, and is a complaint that carries with it a high risk of complications and morbidity.  The differential diagnosis includes ***   Co morbidities that complicate the patient evaluation  See HPI   Additional history obtained:  Additional history obtained from EMR External records from outside source obtained and reviewed including ***   Lab Tests:  I Ordered, and personally interpreted labs.  The pertinent results include:  ***   Imaging Studies ordered:  I ordered imaging studies including ***  I independently visualized and interpreted imaging which showed *** I agree with the radiologist interpretation   Cardiac Monitoring: / EKG:  The patient was  maintained on a cardiac monitor.  I personally viewed and interpreted the cardiac monitored which showed an underlying rhythm of: ***   Consultations Obtained:  I requested consultation with the ***,  and discussed lab and imaging findings as well as pertinent plan - they recommend: ***   Problem List / ED Course / Critical interventions / Medication management  *** I ordered medication including ***  for ***  Reevaluation of the patient after these medicines showed that the patient {resolved/improved/worsened:23923::"improved"} I have reviewed the patients home medicines and have made adjustments as needed   Social Determinants of Health:  ***   Test / Admission - Considered:  Vitals signs significant for ***. Otherwise within normal range and stable throughout visit. Laboratory/imaging studies significant for: *** *** Worrisome signs and symptoms were discussed with the patient, and the patient acknowledged understanding to return to the ED if noticed. Patient was stable upon discharge.    {Document critical care time when appropriate:1} {Document review of labs and clinical decision tools ie heart score, Chads2Vasc2 etc:1}  {Document your independent review of radiology images, and any outside records:1} {Document your discussion with family members, caretakers, and with consultants:1} {Document social determinants of health affecting pt's care:1} {Document your decision making why or why not admission, treatments were needed:1} Final Clinical Impression(s) / ED Diagnoses Final diagnoses:  None    Rx / DC Orders ED Discharge Orders     None

## 2023-03-20 NOTE — ED Notes (Signed)
Kara Bailey [phone] Point of contact

## 2023-03-28 ENCOUNTER — Encounter (HOSPITAL_COMMUNITY): Payer: Self-pay

## 2023-03-28 ENCOUNTER — Other Ambulatory Visit: Payer: Self-pay

## 2023-03-28 ENCOUNTER — Emergency Department (HOSPITAL_COMMUNITY)
Admission: EM | Admit: 2023-03-28 | Discharge: 2023-03-28 | Disposition: A | Discharge status: Skilled Nursing Facility | Payer: Medicare HMO | Attending: Emergency Medicine | Admitting: Emergency Medicine

## 2023-03-28 DIAGNOSIS — K9421 Gastrostomy hemorrhage: Secondary | ICD-10-CM | POA: Insufficient documentation

## 2023-03-28 DIAGNOSIS — Z7902 Long term (current) use of antithrombotics/antiplatelets: Secondary | ICD-10-CM | POA: Insufficient documentation

## 2023-03-28 DIAGNOSIS — K9423 Gastrostomy malfunction: Secondary | ICD-10-CM

## 2023-03-28 DIAGNOSIS — Z7982 Long term (current) use of aspirin: Secondary | ICD-10-CM | POA: Diagnosis not present

## 2023-03-28 MED ORDER — SILVER NITRATE-POT NITRATE 75-25 % EX MISC
1.0000 | Freq: Once | CUTANEOUS | Status: AC
  Administered 2023-03-28: 1 via TOPICAL
  Filled 2023-03-28: qty 10

## 2023-03-28 MED ORDER — ALPRAZOLAM 0.5 MG PO TABS
0.5000 mg | ORAL_TABLET | Freq: Once | ORAL | Status: AC
  Administered 2023-03-28: 0.5 mg via ORAL
  Filled 2023-03-28: qty 1

## 2023-03-28 NOTE — Discharge Instructions (Signed)
There was a small spot area of bleeding at the edge of the tissue where the G-tube inserts into the stomach.  This can be cleaned twice a day gently with topical bacitracin ointment and a sterile dressing.  Small amount of bleeding is of no consequence that she does not need to come back to the hospital unless she is having severe bleeding or other complications.  She will need to be seen daily by her provider there to make sure that this is healing well.

## 2023-03-28 NOTE — ED Triage Notes (Signed)
Pt BIB [facility] form [facility]. This nurse received report from nurse at facility who reports pt Dr wanted pt to be sent to ED for evaluation of feeding tube, as it has been oozing blood. Reports pt has hx of dysphagia from prior CVA, but now eats and takes meds PO. Report tube has been functional and flushing fine, just oozing blood. EMS report they heard staff talking about wound care nurse coming to perform dressing change on pt PEG tube, but Dr wanted them sent to ED.

## 2023-03-28 NOTE — ED Triage Notes (Signed)
Pt states she wants the tube removed and states that that is what has eben talked about at the facility.

## 2023-03-28 NOTE — ED Notes (Signed)
Assisted EDP with Peg tube care. Applied non-adhesive bandage along with 4X4

## 2023-03-28 NOTE — ED Provider Notes (Signed)
[place] [facility]GENC[facility] DEPARTMENT AT [facility] Provider Note   CSN: [id number] Arrival date & time: 03/28/23  1536     History  Chief Complaint  Patient presents with   feeding tube oozing blood     Kara Bailey is a 67 y.o. female.  HPI   This patient is a very pleasant 67 year old female who has a feeding tube, she was sent for evaluation as there has been a small amount of bleeding around the site of the tube.  No other complaints, the tube is working well according to the staff at the facility they are able to use it without difficulty but they wanted her to be evaluated because of the bleeding.  Home Medications Prior to Admission medications   Medication Sig Start Date End Date Taking? Authorizing Provider  acetaminophen (TYLENOL) 325 MG tablet Take 1-2 tablets (325-650 mg total) by mouth every 4 (four) hours as needed for mild pain. Patient not taking: Reported on 11/29/2022 09/06/22   Milinda Antis, PA-C  albuterol (VENTOLIN HFA) 108 (90 Base) MCG/ACT inhaler Inhale 2 puffs into the lungs every 6 (six) hours as needed for wheezing or shortness of breath.    [provider]  ALPRAZolam Prudy Feeler) 0.25 MG tablet Take 2 tablets (0.5 mg total) by mouth daily as needed for anxiety. 12/07/22   Burnadette Pop, MD  aspirin 81 MG chewable tablet Place 1 tablet (81 mg total) into feeding tube daily. 12/17/22   Albertine Grates, MD  atorvastatin (LIPITOR) 80 MG tablet Take 1 tablet (80 mg total) by mouth daily. 09/06/22   Setzer, Lynnell Jude, PA-C  Baclofen 5 MG TABS Take 1 tablet by mouth every 8 (eight) hours as needed (pain). 11/24/22   [provider]  citalopram (CELEXA) 10 MG tablet Take 10 mg by mouth every morning. 10/31/22   [provider]  clopidogrel (PLAVIX) 75 MG tablet Take 1 tablet (75 mg total) by mouth daily. 12/20/22   Runell Gess, MD  diphenoxylate-atropine (LOMOTIL) 2.5-0.025 MG tablet Place 2 tablets into feeding tube 4 (four) times  daily. 01/14/23   Dorcas Carrow, MD  ezetimibe (ZETIA) 10 MG tablet Take 10 mg by mouth daily.    [provider]  famotidine (PEPCID) 40 MG tablet Take 40 mg by mouth at bedtime. 10/25/20   [provider]  feeding supplement (ENSURE ENLIVE / ENSURE PLUS) LIQD Take 237 mLs by mouth 2 (two) times daily between meals. 12/07/22   Burnadette Pop, MD  HYDROcodone-acetaminophen (NORCO/VICODIN) 5-325 MG tablet Take 2 tablets by mouth every 4 (four) hours as needed. 03/20/23   Gareth Eagle, PA-C  hydroxypropyl methylcellulose / hypromellose (ISOPTO TEARS / GONIOVISC) 2.5 % ophthalmic solution Place 1 drop into both eyes in the morning and at bedtime.    [provider]  hydrOXYzine (ATARAX) 50 MG tablet Take 50 mg by mouth every 8 (eight) hours as needed for anxiety.    [provider]  magnesium oxide (MAG-OX) 400 (240 Mg) MG tablet Place 2 tablets (800 mg total) into feeding tube 2 (two) times daily. 12/07/22   Burnadette Pop, MD  melatonin 3 MG TABS tablet Take 1 tablet (3 mg total) by mouth at bedtime. Patient not taking: Reported on 11/29/2022 09/06/22   Milinda Antis, PA-C  mirtazapine (REMERON) 15 MG tablet Take 15 mg by mouth at bedtime. 09/27/22   [provider]  Multiple Vitamin (MULTIVITAMIN WITH MINERALS) TABS tablet Take 1 tablet by mouth daily. 12/31/20  Calvert Cantor, MD  Nutritional Supplements (FEEDING SUPPLEMENT, JEVITY 1.5 CAL/FIBER,) LIQD Place 1,000 mLs into feeding tube daily. 01/15/23   Dorcas Carrow, MD  Nutritional Supplements (FEEDING SUPPLEMENT, OSMOLITE 1.5 CAL,) LIQD Place 1,000 mLs into feeding tube continuous. 12/07/22   Burnadette Pop, MD  ondansetron (ZOFRAN-ODT) 8 MG disintegrating tablet Take 1 tablet (8 mg total) by mouth every 8 (eight) hours as needed for nausea or vomiting. 02/05/23   Dione Booze, MD  polyethylene glycol (MIRALAX / GLYCOLAX) 17 g packet Take 17 g by mouth daily as needed. Patient not taking: Reported on  11/29/2022 09/06/22   Milinda Antis, PA-C  Protein (FEEDING SUPPLEMENT, PROSOURCE TF20,) liquid Place 60 mLs into feeding tube 3 (three) times daily between meals. 01/14/23   Dorcas Carrow, MD  senna-docusate (SENOKOT-S) 8.6-50 MG tablet Take 1 tablet by mouth 2 (two) times daily. Patient not taking: Reported on 11/29/2022 09/06/22   Milinda Antis, PA-C  thiamine (VITAMIN B-1) 100 MG tablet Take 100 mg by mouth daily.    [provider]  tiZANidine (ZANAFLEX) 2 MG tablet TAKE 1 TABLET(2 MG) BY MOUTH AT BEDTIME Patient not taking: Reported on 11/29/2022 10/15/22   Erick Colace, MD  traMADol (ULTRAM) 50 MG tablet Take 1 tablet (50 mg total) by mouth every 6 (six) hours as needed for moderate pain. 12/07/22   Burnadette Pop, MD  Water For Irrigation, Sterile (FREE WATER) SOLN Place 100 mLs into feeding tube every 4 (four) hours. 12/07/22   Burnadette Pop, MD      Allergies    Patient has no known allergies.    Review of Systems   Review of Systems  All other systems reviewed and are negative.   Physical Exam Updated Vital Signs Temp 98.4 F (36.9 C) (Oral)   Ht 1.676 m (5\' 6" )   Wt 51 kg   BMI 18.15 kg/m  Physical Exam Vitals and nursing note reviewed.  Constitutional:      General: She is not in acute distress.    Appearance: She is well-developed.  HENT:     Head: Normocephalic and atraumatic.     Mouth/Throat:     Pharynx: No oropharyngeal exudate.  Eyes:     General: No scleral icterus.       Right eye: No discharge.        Left eye: No discharge.     Conjunctiva/sclera: Conjunctivae normal.     Pupils: Pupils are equal, round, and reactive to light.  Neck:     Thyroid: No thyromegaly.     Vascular: No JVD.  Cardiovascular:     Rate and Rhythm: Normal rate and regular rhythm.     Heart sounds: Normal heart sounds. No murmur heard.    No friction rub. No gallop.  Pulmonary:     Effort: Pulmonary effort is normal. No respiratory distress.     Breath  sounds: Normal breath sounds. No wheezing or rales.  Abdominal:     General: Bowel sounds are normal. There is no distension.     Palpations: Abdomen is soft. There is no mass.     Tenderness: There is no abdominal tenderness.     Comments: Small amount of bleeding of the tissue at the G-tube site  Musculoskeletal:        General: No tenderness. Normal range of motion.     Cervical back: Normal range of motion and neck supple.     Right lower leg: No edema.  Left lower leg: No edema.  Lymphadenopathy:     Cervical: No cervical adenopathy.  Skin:    General: Skin is warm and dry.     Findings: No erythema or rash.  Neurological:     Mental Status: She is alert.     Comments: Left upper extremity contracture  Psychiatric:        Behavior: Behavior normal.     ED Results / Procedures / Treatments   Labs (all labs ordered are listed, but only abnormal results are displayed) Labs Reviewed - No data to display  EKG None  Radiology No results found.  Procedures Procedures    Medications Ordered in ED Medications  silver nitrate applicators applicator 1 Application (has no administration in time range)    ED Course/ Medical Decision Making/ A&P                             Medical Decision Making Risk Prescription drug management.   Tube is well-seated in location, it is working appropriately, confirmed, small amount of bleeding from the friable tissue.  Not anticoagulated, well-appearing otherwise  Patient well-appearing, cautery used to stop the minimal amount of spot bleeding that she had, sterile dressing placed, communicated with nursing home with discharge instructions, stable for discharge  Family at the bedside requesting information, they were updated on the treatment plan and they are in total agreement.        Final Clinical Impression(s) / ED Diagnoses Final diagnoses:  Gastrostomy tube dysfunction [facility])    Rx / DC Orders ED Discharge  Orders     None         Eber Hong, MD 03/28/23 1652

## 2023-08-29 ENCOUNTER — Other Ambulatory Visit (HOSPITAL_COMMUNITY): Payer: Self-pay

## 2023-08-29 DIAGNOSIS — R1310 Dysphagia, unspecified: Secondary | ICD-10-CM

## 2023-08-29 DIAGNOSIS — Z431 Encounter for attention to gastrostomy: Secondary | ICD-10-CM

## 2023-09-04 ENCOUNTER — Ambulatory Visit (HOSPITAL_COMMUNITY): Payer: Medicare HMO

## 2023-09-12 ENCOUNTER — Ambulatory Visit (HOSPITAL_COMMUNITY)
Admission: RE | Admit: 2023-09-12 | Discharge: 2023-09-12 | Disposition: A | Discharge status: Home / Self Care | Payer: Medicare HMO | Source: Ambulatory Visit

## 2023-09-12 DIAGNOSIS — R1310 Dysphagia, unspecified: Secondary | ICD-10-CM | POA: Insufficient documentation

## 2023-09-12 DIAGNOSIS — Z431 Encounter for attention to gastrostomy: Secondary | ICD-10-CM | POA: Insufficient documentation

## 2023-09-12 HISTORY — PX: IR GASTROSTOMY TUBE REMOVAL: IMG5492

## 2023-11-28 ENCOUNTER — Institutional Professional Consult (permissible substitution) (INDEPENDENT_AMBULATORY_CARE_PROVIDER_SITE_OTHER): Payer: Medicare HMO | Admitting: Otolaryngology

## 2023-12-02 ENCOUNTER — Institutional Professional Consult (permissible substitution) (INDEPENDENT_AMBULATORY_CARE_PROVIDER_SITE_OTHER): Payer: Medicare HMO | Admitting: Otolaryngology

## 2023-12-12 ENCOUNTER — Ambulatory Visit (INDEPENDENT_AMBULATORY_CARE_PROVIDER_SITE_OTHER): Payer: Medicare HMO | Admitting: Otolaryngology

## 2023-12-12 ENCOUNTER — Encounter (INDEPENDENT_AMBULATORY_CARE_PROVIDER_SITE_OTHER): Payer: Self-pay | Admitting: Otolaryngology

## 2023-12-12 VITALS — BP 190/94 | HR 103

## 2023-12-12 DIAGNOSIS — R1311 Dysphagia, oral phase: Secondary | ICD-10-CM | POA: Diagnosis not present

## 2023-12-12 DIAGNOSIS — K219 Gastro-esophageal reflux disease without esophagitis: Secondary | ICD-10-CM

## 2023-12-12 DIAGNOSIS — I69354 Hemiplegia and hemiparesis following cerebral infarction affecting left non-dominant side: Secondary | ICD-10-CM

## 2023-12-12 DIAGNOSIS — R490 Dysphonia: Secondary | ICD-10-CM | POA: Diagnosis not present

## 2023-12-12 DIAGNOSIS — Z8673 Personal history of transient ischemic attack (TIA), and cerebral infarction without residual deficits: Secondary | ICD-10-CM

## 2023-12-12 DIAGNOSIS — R0982 Postnasal drip: Secondary | ICD-10-CM | POA: Diagnosis not present

## 2023-12-12 DIAGNOSIS — J383 Other diseases of vocal cords: Secondary | ICD-10-CM

## 2023-12-12 NOTE — Progress Notes (Signed)
ENT CONSULT:  Reason for Consult: dysphonia hx of stroke    HPI: Discussed the use of AI scribe software for clinical note transcription with the patient, who gave verbal consent to proceed.  History of Present Illness   The patient, with a history of stroke, presents with difficulty speaking loudly.  She has been experiencing difficulty speaking loudly for several months, which began following a stroke in 2023. There is no associated pain, but the stroke has impacted her ability to project her voice. She reports residual left arm weakness and lower extremity weakness, unable to ambulate on her own. She reports consuming regular diet.   She resides in a rehabilitation facility due to the stroke and is unable to walk independently. She experiences arm weakness as a result of the stroke. She is currently on a regular diet and does not have trouble swallowing. She denies dyspnea.   Patient arrived on her own from a facility, no staff is available to assist with hx. Only limited hx obtained from the patient, she is a poor historian. Records indicate she had G-tube before, and it was removed 09/12/23. She states she is leaving rehab in March 2025.      Records Reviewed:  ED note from 03/28/23  This patient is a very pleasant 68 year old female who has a feeding tube, she was sent for evaluation as there has been a small amount of bleeding around the site of the tube.  No other complaints, the tube is working well according to the staff at the facility they are able to use it without difficulty but they wanted her to be evaluated because of the bleeding.    Past Medical History:  Diagnosis Date   Acute CVA (cerebrovascular accident) (HCC) 12/29/2020   Anxiety    Chest pain 08/2014   unspecified   Cirrhosis (HCC)    CVA (cerebral vascular accident) (HCC) 07/27/2022   Depression    Dizziness 08/2014   Dyspnea 08/2014   Dysrhythmia    Emphysema lung (HCC)    GERD (gastroesophageal reflux  disease)    Heart murmur    Hepatitis C    Hiatal hernia    Hypertension    Ischemic stroke (HCC) 08/01/2022   PAC (premature atrial contraction) 10/11/2014   PAD (peripheral artery disease) (HCC)    Palpitations    PVC (premature ventricular contraction) 10/11/2014   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   Stroke (HCC)    just slurred speech   Tachycardia 08/2014   Ventral hernia    Weakness 08/2014    Past Surgical History:  Procedure Laterality Date   ABDOMINAL AORTOGRAM W/LOWER EXTREMITY N/A 07/13/2021   Procedure: ABDOMINAL AORTOGRAM W/LOWER EXTREMITY;  Surgeon: Runell Gess, MD;  Location: [facility];  Service: Cardiovascular;  Laterality: N/A;   ABDOMINAL AORTOGRAM W/LOWER EXTREMITY N/A 03/19/2022   Procedure: ABDOMINAL AORTOGRAM W/LOWER EXTREMITY;  Surgeon: Runell Gess, MD;  Location: [facility] CV [facility];  Service: Cardiovascular;  Laterality: N/A;   APPENDECTOMY     CATARACT EXTRACTION, BILATERAL     COLONOSCOPY     COLONOSCOPY WITH PROPOFOL N/A 04/30/2016   Procedure: COLONOSCOPY WITH PROPOFOL;  Surgeon: Corbin Ade, MD;  Location: [facility];  Service: Endoscopy;  Laterality: N/A;  1230   EPIGASTRIC HERNIA REPAIR N/A 10/13/2021   Procedure: OPEN REPAIR EPIGASTRIC HERNIA WITH MESH PATCH;  Surgeon: Darnell Level, MD;  Location: [facility] OR[facility];  Service: General;  Laterality: N/A;   ESOPHAGOGASTRODUODENOSCOPY  approximately 2010   ESOPHAGOGASTRODUODENOSCOPY (EGD) WITH PROPOFOL N/A 04/30/2016   Procedure: ESOPHAGOGASTRODUODENOSCOPY (EGD) WITH PROPOFOL;  Surgeon: Corbin Ade, MD;  Location: [facility];  Service: Endoscopy;  Laterality: N/A;   ESOPHAGOGASTRODUODENOSCOPY (EGD) WITH PROPOFOL N/A 12/28/2022   Procedure: ESOPHAGOGASTRODUODENOSCOPY (EGD) WITH PROPOFOL;  Surgeon: Iva Boop, MD;  Location: [facility] END[facility];  Service: Gastroenterology;  Laterality: N/A;   IR GASTROSTOMY TUBE MOD SED  12/03/2022   IR GASTROSTOMY TUBE REMOVAL  09/12/2023    LEFT HEART CATHETERIZATION WITH CORONARY ANGIOGRAM N/A 10/29/2014   07-14-20- pt denies this Procedure: LEFT HEART CATHETERIZATION WITH CORONARY ANGIOGRAM;  Surgeon: Kathleene Hazel, MD;  Location: [facility] CATH LAB;  Service: Cardiovascular;  Laterality: N/A;   MALONEY DILATION N/A 04/30/2016   Procedure: Elease Hashimoto DILATION;  Surgeon: Corbin Ade, MD;  Location: [facility];  Service: Endoscopy;  Laterality: N/A;   PERIPHERAL VASCULAR INTERVENTION  07/13/2021   Procedure: PERIPHERAL VASCULAR INTERVENTION;  Surgeon: Runell Gess, MD;  Location: [facility];  Service: Cardiovascular;;  left SFA left external iliac   PERIPHERAL VASCULAR INTERVENTION Right 03/19/2022   Procedure: PERIPHERAL VASCULAR INTERVENTION;  Surgeon: Runell Gess, MD;  Location: [facility];  Service: Cardiovascular;  Laterality: Right;  SFA   POLYPECTOMY  04/30/2016   Procedure: POLYPECTOMY;  Surgeon: Corbin Ade, MD;  Location: [facility];  Service: Endoscopy;;  Sigmoid colon polyp removed via hot snare   UPPER GASTROINTESTINAL ENDOSCOPY      Family History  Problem Relation Age of Onset   Heart failure Mother    Heart attack Mother    Heart disease Mother    Hypertension Mother    Hyperlipidemia Mother    CVA Father    Alzheimer's disease Father    Cancer Neg Hx    Colon cancer Neg Hx    Esophageal cancer Neg Hx    Rectal cancer Neg Hx    Stomach cancer Neg Hx     Social History:  reports that she has been smoking cigarettes. She started smoking about 55 years ago. She has a 27.5 pack-year smoking history. She has never used smokeless tobacco. She reports that she does not currently use alcohol. She reports that she does not use drugs.  Allergies: No Known Allergies  Medications: I have reviewed the patient's current medications.  The PMH, PSH, Medications, Allergies, and SH were reviewed and updated.  ROS: Constitutional: Negative for fever, weight loss and weight  gain. Cardiovascular: Negative for chest pain and dyspnea on exertion. Respiratory: Is not experiencing shortness of breath at rest. Gastrointestinal: Negative for nausea and vomiting. Neurological: Negative for headaches. Psychiatric: The patient is not nervous/anxious  Blood pressure (!) 190/94, pulse (!) 103, SpO2 94%.  PHYSICAL EXAM:  Exam: General: Well-developed, well-nourished Communication and Voice: breathy and distant voice with poor projection Respiratory Respiratory effort: Equal inspiration and expiration without stridor Cardiovascular Peripheral Vascular: Warm extremities with equal color/perfusion Eyes: No nystagmus with equal extraocular motion bilaterally Neuro/Psych/Balance: Patient oriented to person, place, and time; Appropriate mood and affect; Gait is intact with no imbalance; Cranial nerves I-XII are intact Head and Face Inspection: Normocephalic and atraumatic without mass or lesion Palpation: Facial skeleton intact without bony stepoffs Salivary Glands: No mass or tenderness Facial Strength: Facial motility symmetric and full bilaterally ENT Pinna: External ear intact and fully developed External canal: Canal is patent with intact skin Tympanic Membrane: Clear and mobile External Nose: No scar or anatomic deformity Internal Nose:  Septum is relatively straight. No polyp, or purulence. Mucosal edema and erythema present.  Bilateral inferior turbinate hypertrophy.  Lips, Teeth, and gums: Mucosa and teeth intact and viable TMJ: No pain to palpation with full mobility Oral cavity/oropharynx: No erythema or exudate, no lesions present Mild tongue weakness in all directions  Nasopharynx: No mass or lesion with intact mucosa Hypopharynx: Intact mucosa without pooling of secretions Larynx Glottic: Full true vocal cord mobility without lesion or mass Supraglottic: Normal appearing epiglottis and AE folds Interarytenoid Space: Moderate  pachydermia&edema Subglottic Space: Patent without lesion or edema Neck Neck and Trachea: Midline trachea without mass or lesion Thyroid: No mass or nodularity Lymphatics: No lymphadenopathy  Procedure: Preoperative diagnosis: dysphonia   Postoperative diagnosis:   Same +GERD LPR  Procedure: Flexible fiberoptic laryngoscopy  Surgeon: Ashok Croon, MD  Anesthesia: Topical lidocaine and Afrin Complications: None Condition is stable throughout exam  Indications and consent:  The patient presents to the clinic with Indirect laryngoscopy view was incomplete. Thus it was recommended that they undergo a flexible fiberoptic laryngoscopy. All of the risks, benefits, and potential complications were reviewed with the patient preoperatively and verbal informed consent was obtained.  Procedure: The patient was seated upright in the clinic. Topical lidocaine and Afrin were applied to the nasal cavity. After adequate anesthesia had occurred, I then proceeded to pass the flexible telescope into the nasal cavity. The nasal cavity was patent without rhinorrhea or polyp. The nasopharynx was also patent without mass or lesion. The base of tongue was visualized and was normal. There were no signs of pooling of secretions in the piriform sinuses. The true vocal folds were mobile bilaterally. There were no signs of glottic or supraglottic mucosal lesion or mass. There was moderate interarytenoid pachydermia and post cricoid edema. The telescope was then slowly withdrawn and the patient tolerated the procedure throughout.  Studies Reviewed: Bedside swallow evaluation 07/27/22 HPI: 68 yo female adm [facility] with left hemiparesis and fall.  MRI showed patchy acute on chronic small vessel ischemia in the right corona  radiata, tracking toward the posterior right lentiform. No associated hemorrhage or mass effect.  Pt has h/o CVA with dysarthria, dysphagia. She failed [facility] swallow screen and SLP eval ordered.  Pt  admits to some history of dysphagia - coughing with intake and sensing pills sticking in her esophagus.  H/O esophageal dilatation in 2021 - which she reported helped her swallowing. MBS February 2022 recommending Dys 3 diet and nectar thick liquids with one instance of aspiration (PAS 7) and additional penetration (PAS 3) but with coughing noted clinically. Repeat MBS April 2022 showed improved airway protection (PAS 2) with recommendation for regular solids and thin liquids. PMH includes: anxiety, CP, depression, GERD, HH, stroke, emphysema, PAD, smoker   Pt presents with an oral more than pharyngeal dysphagia. She has a lot of difficulty achieving labial seal and sucking to get liquids from a cup or straw, ultimately most successful with use of a straw placed on the R side of her mouth. Thicker liquids were more challenging to get up through a straw still. She has transient oral holding as she seems to be trying to initiate posterior transit with liquids spilling under her tongue. Mastication is prolonged, effortful, and incomplete, but oral residue across textures is fairly mild. Her pharyngeal function is relatively intact although there are moments of trace, transient penetration (PAS 2) that seem to be a result more so of oral loss prior to the swallow. This occurs with both  thin and nectar thick liquids, so would recommend starting with thin liquids for now. Discussed solids with pt who would like to start with purees for ease of oral preparation and clearance.   Assessment/Plan: Encounter Diagnoses  Name Primary?   History of stroke Yes   Oral phase dysphagia    Dysphonia    Chronic GERD    Post-nasal drip     Assessment and Plan    Chronic Dysphonia poor voice projection Chronic dysphonia for several months without associated pain when talking, voice sounds distant and breathy with poor projection. Examination revealed normal vocal cord movement with possible slight right-sided weakness.  Postnasal drainage and reflux changes noted, likely contributing to voice changes, but the main issue is likely central 2/2 hx of stroke and poor lung support. She had age-related VF atrophy and glottic insufficiency with supraglottic compression on scope exam as well. No signs of tumor or mass. Discussed benefits of speech therapy and use of Flonase and Pepcid. - Refer to speech therapy to improve voice projection  - Prescribe Flonase, 2 sprays bilaterally in each nostril BID - Ensure use of Pepcid for reflux management  Chronic GERD LPR - trail of Pepcid 20 mg BID - diet and lifestyle changes to minimize GERD  Post-nasal drip - trial of Flonase 2 puffs b/l nares BID  Stroke (Cerebrovascular Accident) Stroke in September 2023 with residual effects including inability to walk independently and arm weakness. No significant swallowing impairment. Anticipated discharge from rehab facility by March 2025. - Continue rehabilitation therapy - Plan for discharge from rehab facility by March 2025  Follow-up - Provide a copy of the office visit note for facility  - Ensure coordination with the rehabilitation facility for ongoing care.      Thank you for allowing me to participate in the care of this patient. Please do not hesitate to contact me with any questions or concerns.   Ashok Croon, MD Otolaryngology [facility] Phone: [phone] Fax: [phone]    12/12/2023, 2:35 PM

## 2024-01-23 ENCOUNTER — Ambulatory Visit (INDEPENDENT_AMBULATORY_CARE_PROVIDER_SITE_OTHER): Payer: Medicare HMO | Admitting: Otolaryngology

## 2024-02-18 ENCOUNTER — Ambulatory Visit (INDEPENDENT_AMBULATORY_CARE_PROVIDER_SITE_OTHER): Payer: Medicare HMO | Admitting: Neurology

## 2024-02-18 ENCOUNTER — Telehealth: Payer: Self-pay | Admitting: Anesthesiology

## 2024-02-18 VITALS — BP 143/79 | HR 78

## 2024-02-18 DIAGNOSIS — I69354 Hemiplegia and hemiparesis following cerebral infarction affecting left non-dominant side: Secondary | ICD-10-CM | POA: Diagnosis not present

## 2024-02-18 DIAGNOSIS — R471 Dysarthria and anarthria: Secondary | ICD-10-CM | POA: Insufficient documentation

## 2024-02-18 NOTE — Progress Notes (Signed)
 Chief Complaint  Patient presents with   New Patient (Initial Visit)    Pt in 14, here with driver [name]  Pt is referred for spasticity of left hand.      ASSESSMENT AND PLAN  Kara Bailey is a 68 y.o. 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   MEDICAL HISTORY:  Kara Bailey is a 68 year old female, seen in request by her primary care doctor Roseanne Reno, [place] for evaluation of spastic left hemiparesis, botulism toxin injection, she is accompanied by her facility driver at today's visit February 18, 2024  History is obtained from the patient and review of electronic medical records. I personally reviewed pertinent available imaging films in PACS.   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, had a G-tube placement on December 03, 2021 History of esophageal stricture, dilated in 2020,  She had a history of left thalamic stroke, was on aspirin and Plavix, presenting with worsening left facial droop, dysarthria, left-sided weakness on July 26, 2022, MRI of the brain demonstrated acute stroke at the right corona radiata and posterior lentiform, since stroke, she had a significant residual spastic left hemiparesis,  She lost the use of her left arm, significant left finger pain, shoulder pain with passive stretch, no antigravity movement of left lower extremity, needing help transfer  CT angiogram of head and neck showed no large vessel disease  She later developed significant dysarthria, dysphagia required transient PEG tube placement December 03, 2022, and was  discharged to nursing home  Echocardiogram in September 2023 ejection fraction more than 75%, hyperdynamic function of left ventricle, no regional wall motion abnormality   PHYSICAL EXAM:   Vitals:   02/18/24 1424  BP: (!) 143/79  Pulse: 78   There is no height or weight on file to calculate BMI.  PHYSICAL EXAMNIATION:  Gen: NAD, conversant, well nourised, well groomed                     Cardiovascular: Regular rate rhythm, no peripheral edema, warm, nontender. Eyes: Conjunctivae clear without exudates or hemorrhage Neck: Supple, no carotid bruits. Pulmonary: Clear to auscultation bilaterally   NEUROLOGICAL EXAM:  MENTAL STATUS: Speech/cognition: Dysarthria, awake, alert, oriented to history taking and casual conversation CRANIAL NERVES: CN II: Visual fields are full to confrontation. Pupils are round equal and briskly reactive to light. CN III, IV, VI: extraocular movement are normal. No ptosis. CN V: Facial sensation is intact to light touch CN VII: Face is symmetric with normal eye closure  CN VIII: Hearing is normal to causal conversation. CN IX, X: Phonation is normal. CN XI: Head turning and shoulder shrug are intact  MOTOR: Spastic left hemiparesis, no antigravity movement of left upper or lower extremity, left hand form in finger flexion, with passive stretch, finger can reach full range of motion, tight left shoulder, complains of pain with passive stretch, preserved range of motion of left elbow, tendency for left ankle plantarflexion  REFLEXES: Hyperreflexia of left upper and lower extremity  SENSORY: Intact to light touch, pinprick and vibratory sensation are intact in fingers and toes.  COORDINATION: There is  no trunk or limb dysmetria noted.  GAIT/STANCE: Deferred  REVIEW OF SYSTEMS:  Full 14 system review of systems performed and notable only for as above All other review of systems were negative.   ALLERGIES: No Known Allergies  HOME  MEDICATIONS: Current Outpatient Medications  Medication Sig Dispense Refill   acetaminophen (TYLENOL) 325 MG tablet Take 1-2 tablets (325-650 mg total) by mouth every 4 (four) hours as needed for mild pain.     ALPRAZolam (XANAX) 0.25 MG tablet Take 2 tablets (0.5 mg total) by mouth daily as needed for anxiety. 15 tablet 0   aspirin 81 MG chewable tablet Place 1 tablet (81 mg total) into feeding tube daily.     atorvastatin (LIPITOR) 80 MG tablet Take 1 tablet (80 mg total) by mouth daily. 30 tablet 0   busPIRone (BUSPAR) 5 MG tablet Take 5 mg by mouth 3 (three) times daily.     Calcium Carb-Cholecalciferol (CALCIUM 1000 + D) 1000-20 MG-MCG TABS Take 1 tablet by mouth daily.     clopidogrel (PLAVIX) 75 MG tablet Take 1 tablet (75 mg total) by mouth daily. 90 tablet 0   diphenoxylate-atropine (LOMOTIL) 2.5-0.025 MG tablet Place 2 tablets into feeding tube 4 (four) times daily. 30 tablet 0   ezetimibe (ZETIA) 10 MG tablet Take 10 mg by mouth daily.     fluticasone (FLONASE) 50 MCG/ACT nasal spray Place into both nostrils daily.     melatonin 3 MG TABS tablet Take 1 tablet (3 mg total) by mouth at bedtime. 30 tablet 0   mirtazapine (REMERON) 15 MG tablet Take 15 mg by mouth at bedtime.     Multiple Vitamin (MULTIVITAMIN WITH MINERALS) TABS tablet Take 1 tablet by mouth daily. 30 tablet 1   mycophenolate (CELLCEPT) 500 MG tablet Take by mouth 2 (two) times daily.     Nutritional Supplements (FEEDING SUPPLEMENT, JEVITY 1.5 CAL/FIBER,) LIQD Place 1,000 mLs into feeding tube daily.     thiamine (VITAMIN B-1) 100 MG tablet Take 100 mg by mouth daily.     Vilazodone HCl (VIIBRYD) 10 MG TABS Take 10 mg by mouth daily.     albuterol (VENTOLIN HFA) 108 (90 Base) MCG/ACT inhaler Inhale 2 puffs into the lungs every 6 (six) hours as needed for wheezing or shortness of breath. (Patient not taking: Reported on 02/18/2024)     Baclofen 5 MG TABS Take 1 tablet by mouth every 8 (eight) hours as needed (pain).  (Patient not taking: Reported on 02/18/2024)     citalopram (CELEXA) 10 MG tablet Take 10 mg by mouth every morning. (Patient not taking: Reported on 02/18/2024)     famotidine (PEPCID) 40 MG tablet Take 40 mg by mouth at bedtime.     feeding supplement (ENSURE ENLIVE / ENSURE PLUS) LIQD Take 237 mLs by mouth 2 (two) times daily between meals. 237 mL 12   HYDROcodone-acetaminophen (NORCO/VICODIN) 5-325 MG tablet Take 2 tablets by mouth every 4 (four) hours as needed. 10 tablet 0   hydroxypropyl methylcellulose / hypromellose (ISOPTO TEARS / GONIOVISC) 2.5 % ophthalmic solution Place 1 drop into both eyes in the morning and at bedtime.     hydrOXYzine (ATARAX) 50 MG tablet Take 50 mg by mouth every 8 (eight) hours as needed for anxiety.     magnesium oxide (MAG-OX) 400 (240 Mg) MG tablet Place 2 tablets (800 mg total) into feeding tube 2 (two) times daily.     Nutritional Supplements (FEEDING SUPPLEMENT, OSMOLITE 1.5 CAL,) LIQD Place 1,000 mLs  into feeding tube continuous. (Patient not taking: Reported on 02/18/2024)  0   ondansetron (ZOFRAN-ODT) 8 MG disintegrating tablet Take 1 tablet (8 mg total) by mouth every 8 (eight) hours as needed for nausea or vomiting. (Patient not taking: Reported on 02/18/2024) 20 tablet 0   polyethylene glycol (MIRALAX / GLYCOLAX) 17 g packet Take 17 g by mouth daily as needed. (Patient not taking: Reported on 02/18/2024) 14 each 0   Protein (FEEDING SUPPLEMENT, PROSOURCE TF20,) liquid Place 60 mLs into feeding tube 3 (three) times daily between meals. (Patient not taking: Reported on 02/18/2024)     senna-docusate (SENOKOT-S) 8.6-50 MG tablet Take 1 tablet by mouth 2 (two) times daily. (Patient not taking: Reported on 02/18/2024)     tiZANidine (ZANAFLEX) 2 MG tablet TAKE 1 TABLET(2 MG) BY MOUTH AT BEDTIME (Patient not taking: Reported on 02/18/2024) 30 tablet 0   traMADol (ULTRAM) 50 MG tablet Take 1 tablet (50 mg total) by mouth every 6 (six) hours as needed for moderate pain. (Patient  not taking: Reported on 02/18/2024) 15 tablet 0   Water For Irrigation, Sterile (FREE WATER) SOLN Place 100 mLs into feeding tube every 4 (four) hours. (Patient not taking: Reported on 02/18/2024)     No current facility-administered medications for this visit.    PAST MEDICAL HISTORY: Past Medical History:  Diagnosis Date   Acute CVA (cerebrovascular accident) (HCC) 12/29/2020   Anxiety    Chest pain 08/2014   unspecified   Cirrhosis (HCC)    CVA (cerebral vascular accident) (HCC) 07/27/2022   Depression    Dizziness 08/2014   Dyspnea 08/2014   Dysrhythmia    Emphysema lung (HCC)    GERD (gastroesophageal reflux disease)    Heart murmur    Hepatitis C    Hiatal hernia    Hypertension    Ischemic stroke (HCC) 08/01/2022   PAC (premature atrial contraction) 10/11/2014   PAD (peripheral artery disease) (HCC)    Palpitations    PVC (premature ventricular contraction) 10/11/2014   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   Stroke (HCC)    just slurred speech   Tachycardia 08/2014   Ventral hernia    Weakness 08/2014    PAST SURGICAL HISTORY: Past Surgical History:  Procedure Laterality Date   ABDOMINAL AORTOGRAM W/LOWER EXTREMITY N/A 07/13/2021   Procedure: ABDOMINAL AORTOGRAM W/LOWER EXTREMITY;  Surgeon: Runell Gess, MD;  [facility]: [facility];  Service: Cardiovascular;  Laterality: N/A;   ABDOMINAL AORTOGRAM W/LOWER EXTREMITY N/A 03/19/2022   Procedure: ABDOMINAL AORTOGRAM W/LOWER EXTREMITY;  Surgeon: Runell Gess, MD;  Location: [facility];  Service: Cardiovascular;  Laterality: N/A;   APPENDECTOMY     CATARACT EXTRACTION, BILATERAL     COLONOSCOPY     COLONOSCOPY WITH PROPOFOL N/A 04/30/2016   Procedure: COLONOSCOPY WITH PROPOFOL;  Surgeon: Corbin Ade, MD;  Location: [facility];  Service: Endoscopy;  Laterality: N/A;  1230   EPIGASTRIC HERNIA REPAIR N/A 10/13/2021   Procedure: OPEN REPAIR EPIGASTRIC HERNIA WITH MESH PATCH;  Surgeon:  Darnell Level, MD;  Location: [facility];  Service: General;  Laterality: N/A;   ESOPHAGOGASTRODUODENOSCOPY     approximately 2010   ESOPHAGOGASTRODUODENOSCOPY (EGD) WITH PROPOFOL N/A 04/30/2016   Procedure: ESOPHAGOGASTRODUODENOSCOPY (EGD) WITH PROPOFOL;  Surgeon: Corbin Ade, MD;  Location: [facility];  Service: Endoscopy;  Laterality: N/A;   ESOPHAGOGASTRODUODENOSCOPY (EGD) WITH PROPOFOL N/A 12/28/2022   Procedure: ESOPHAGOGASTRODUODENOSCOPY (EGD) WITH PROPOFOL;  Surgeon: Iva Boop, MD;  Location:  [facility];  Service: Gastroenterology;  Laterality: N/A;   IR GASTROSTOMY TUBE MOD SED  12/03/2022   IR GASTROSTOMY TUBE REMOVAL  09/12/2023   LEFT HEART CATHETERIZATION WITH CORONARY ANGIOGRAM N/A 10/29/2014   07-14-20- pt denies this Procedure: LEFT HEART CATHETERIZATION WITH CORONARY ANGIOGRAM;  Surgeon: Kathleene Hazel, MD;  Location: [facility] CATH LAB;  Service: Cardiovascular;  Laterality: N/A;   MALONEY DILATION N/A 04/30/2016   Procedure: Elease Hashimoto DILATION;  Surgeon: Corbin Ade, MD;  [facility]: [facility];  Service: Endoscopy;  Laterality: N/A;   PERIPHERAL VASCULAR INTERVENTION  07/13/2021   Procedure: PERIPHERAL VASCULAR INTERVENTION;  Surgeon: Runell Gess, MD;  [facility]: [facility];  Service: Cardiovascular;;  left SFA left external iliac   PERIPHERAL VASCULAR INTERVENTION Right 03/19/2022   Procedure: PERIPHERAL VASCULAR INTERVENTION;  Surgeon: Runell Gess, MD;  [facility]: [facility];  Service: Cardiovascular;  Laterality: Right;  SFA   POLYPECTOMY  04/30/2016   Procedure: POLYPECTOMY;  Surgeon: Corbin Ade, MD;  Location: [facility];  Service: Endoscopy;;  Sigmoid colon polyp removed via hot snare   UPPER GASTROINTESTINAL ENDOSCOPY      FAMILY HISTORY: Family History  Problem Relation Age of Onset   Heart failure Mother    Heart attack Mother    Heart disease Mother    Hypertension Mother    Hyperlipidemia Mother    CVA Father     Alzheimer's disease Father    Cancer Neg Hx    Colon cancer Neg Hx    Esophageal cancer Neg Hx    Rectal cancer Neg Hx    Stomach cancer Neg Hx     SOCIAL HISTORY: Social History   Socioeconomic History   Marital status: Divorced    Spouse name: Not on file   Number of children: 2   Years of education: Not on file   Highest education level: Not on file  Occupational History   Occupation: retired  Tobacco Use   Smoking status: Every Day    Current packs/day: 0.50    Average packs/day: 0.5 packs/day for 55.3 years (27.6 ttl pk-yrs)    Types: Cigarettes    Start date: 11/12/1968   Smokeless tobacco: Never   Tobacco comments:    cutting back, wearing patches  Vaping Use   Vaping status: Never Used  Substance and Sexual Activity   Alcohol use: Not Currently    Comment: beer on weekends (6-pack)   Drug use: No   Sexual activity: Not Currently  Other Topics Concern   Not on file  Social History Narrative   Not on file   Social Drivers of Health   Financial Resource Strain: Not on file  Food Insecurity: Patient Declined (12/04/2022)   Hunger Vital Sign    Worried About Running Out of Food in the Last Year: Patient declined    Ran Out of Food in the Last Year: Patient declined  Transportation Needs: Patient Declined (12/04/2022)   PRAPARE - Administrator, Civil Service (Medical): Patient declined    Lack of Transportation (Non-Medical): Patient declined  Physical Activity: Not on file  Stress: Not on file  Social Connections: Unknown (11/29/2022)   [facility]   Social Network    Social Network: Not on file  Intimate Partner Violence: Patient Declined (12/04/2022)   Humiliation, Afraid, Rape, and Kick questionnaire    Fear of Current or Ex-Partner: Patient declined    Emotionally Abused: Patient declined  Physically Abused: Patient declined    Sexually Abused: Patient declined      Levert Feinstein, M.D. Ph.D.  [facility] [address] [place], [place] [address] Ph: [phone] Fax: [phone]  CC:  Julian Hy, MD [address] [place],  [place] [address]  Karl Ito, DO

## 2024-02-18 NOTE — Telephone Encounter (Signed)
 Kara Bailey, Kara Bailey is still out. Would you mind helping out with this if you have time? Thank you!

## 2024-02-18 NOTE — Telephone Encounter (Signed)
 Botox Injection Order Form

## 2024-02-20 NOTE — Telephone Encounter (Signed)
 Submitted auth request via CMM, status is pending. Key: Alfredia Client

## 2024-02-21 NOTE — Telephone Encounter (Signed)
 Received fax from [facility] that pt was approved for B/B. Auth#: [id number] (03/11/24-11/11/24)

## 2024-03-03 ENCOUNTER — Emergency Department (HOSPITAL_COMMUNITY)

## 2024-03-03 ENCOUNTER — Inpatient Hospital Stay (HOSPITAL_COMMUNITY)
Admission: EM | Admit: 2024-03-03 | Discharge: 2024-03-05 | DRG: 056 | Disposition: A | Discharge status: Skilled Nursing Facility | Source: Skilled Nursing Facility | Attending: Family Medicine | Admitting: Family Medicine

## 2024-03-03 ENCOUNTER — Encounter (HOSPITAL_COMMUNITY): Payer: Self-pay | Admitting: *Deleted

## 2024-03-03 ENCOUNTER — Other Ambulatory Visit: Payer: Self-pay

## 2024-03-03 ENCOUNTER — Observation Stay (HOSPITAL_COMMUNITY)

## 2024-03-03 DIAGNOSIS — K7460 Unspecified cirrhosis of liver: Secondary | ICD-10-CM | POA: Diagnosis present

## 2024-03-03 DIAGNOSIS — K219 Gastro-esophageal reflux disease without esophagitis: Secondary | ICD-10-CM | POA: Diagnosis present

## 2024-03-03 DIAGNOSIS — E782 Mixed hyperlipidemia: Secondary | ICD-10-CM | POA: Diagnosis present

## 2024-03-03 DIAGNOSIS — F32A Depression, unspecified: Secondary | ICD-10-CM | POA: Diagnosis present

## 2024-03-03 DIAGNOSIS — R471 Dysarthria and anarthria: Secondary | ICD-10-CM | POA: Diagnosis present

## 2024-03-03 DIAGNOSIS — N179 Acute kidney failure, unspecified: Principal | ICD-10-CM | POA: Diagnosis present

## 2024-03-03 DIAGNOSIS — R4701 Aphasia: Secondary | ICD-10-CM | POA: Diagnosis present

## 2024-03-03 DIAGNOSIS — F419 Anxiety disorder, unspecified: Secondary | ICD-10-CM | POA: Diagnosis present

## 2024-03-03 DIAGNOSIS — I69391 Dysphagia following cerebral infarction: Secondary | ICD-10-CM

## 2024-03-03 DIAGNOSIS — I739 Peripheral vascular disease, unspecified: Secondary | ICD-10-CM | POA: Diagnosis present

## 2024-03-03 DIAGNOSIS — G9341 Metabolic encephalopathy: Secondary | ICD-10-CM | POA: Insufficient documentation

## 2024-03-03 DIAGNOSIS — I6621 Occlusion and stenosis of right posterior cerebral artery: Secondary | ICD-10-CM | POA: Diagnosis present

## 2024-03-03 DIAGNOSIS — I6601 Occlusion and stenosis of right middle cerebral artery: Secondary | ICD-10-CM | POA: Diagnosis present

## 2024-03-03 DIAGNOSIS — Z87891 Personal history of nicotine dependence: Secondary | ICD-10-CM

## 2024-03-03 DIAGNOSIS — I69322 Dysarthria following cerebral infarction: Principal | ICD-10-CM

## 2024-03-03 DIAGNOSIS — Z7982 Long term (current) use of aspirin: Secondary | ICD-10-CM

## 2024-03-03 DIAGNOSIS — I10 Essential (primary) hypertension: Secondary | ICD-10-CM | POA: Diagnosis present

## 2024-03-03 DIAGNOSIS — I69354 Hemiplegia and hemiparesis following cerebral infarction affecting left non-dominant side: Secondary | ICD-10-CM

## 2024-03-03 DIAGNOSIS — Z79899 Other long term (current) drug therapy: Secondary | ICD-10-CM

## 2024-03-03 DIAGNOSIS — R109 Unspecified abdominal pain: Secondary | ICD-10-CM | POA: Diagnosis present

## 2024-03-03 DIAGNOSIS — R531 Weakness: Secondary | ICD-10-CM | POA: Diagnosis not present

## 2024-03-03 DIAGNOSIS — Z8249 Family history of ischemic heart disease and other diseases of the circulatory system: Secondary | ICD-10-CM

## 2024-03-03 DIAGNOSIS — L120 Bullous pemphigoid: Secondary | ICD-10-CM | POA: Diagnosis present

## 2024-03-03 DIAGNOSIS — Z993 Dependence on wheelchair: Secondary | ICD-10-CM

## 2024-03-03 DIAGNOSIS — Z7902 Long term (current) use of antithrombotics/antiplatelets: Secondary | ICD-10-CM

## 2024-03-03 DIAGNOSIS — J439 Emphysema, unspecified: Secondary | ICD-10-CM | POA: Diagnosis present

## 2024-03-03 DIAGNOSIS — E860 Dehydration: Secondary | ICD-10-CM | POA: Diagnosis present

## 2024-03-03 DIAGNOSIS — R0789 Other chest pain: Secondary | ICD-10-CM | POA: Diagnosis present

## 2024-03-03 DIAGNOSIS — Z79624 Long term (current) use of inhibitors of nucleotide synthesis: Secondary | ICD-10-CM

## 2024-03-03 DIAGNOSIS — K449 Diaphragmatic hernia without obstruction or gangrene: Secondary | ICD-10-CM | POA: Diagnosis present

## 2024-03-03 HISTORY — DX: Insomnia, unspecified: G47.00

## 2024-03-03 HISTORY — DX: Hemiplegia, unspecified affecting left nondominant side: G81.94

## 2024-03-03 HISTORY — DX: Muscle weakness (generalized): M62.81

## 2024-03-03 HISTORY — DX: Hyperlipidemia, unspecified: E78.5

## 2024-03-03 HISTORY — DX: Dysphagia following cerebral infarction: I69.391

## 2024-03-03 HISTORY — DX: Other muscle spasm: M62.838

## 2024-03-03 HISTORY — DX: Peripheral vascular disease, unspecified: I73.9

## 2024-03-03 LAB — COMPREHENSIVE METABOLIC PANEL WITH GFR
ALT: 27 U/L (ref 0–44)
AST: 21 U/L (ref 15–41)
Albumin: 3.7 g/dL (ref 3.5–5.0)
Alkaline Phosphatase: 100 U/L (ref 38–126)
Anion gap: 10 (ref 5–15)
BUN: 21 mg/dL (ref 8–23)
CO2: 26 mmol/L (ref 22–32)
Calcium: 9.6 mg/dL (ref 8.9–10.3)
Chloride: 109 mmol/L (ref 98–111)
Creatinine, Ser: 1.29 mg/dL — ABNORMAL HIGH (ref 0.44–1.00)
GFR, Estimated: 45 mL/min — ABNORMAL LOW (ref >60)
Glucose, Bld: 85 mg/dL (ref 70–99)
Potassium: 4.2 mmol/L (ref 3.5–5.1)
Sodium: 145 mmol/L (ref 135–145)
Total Bilirubin: 0.7 mg/dL (ref 0.0–1.2)
Total Protein: 6.8 g/dL (ref 6.5–8.1)

## 2024-03-03 LAB — HIV ANTIBODY (ROUTINE TESTING W REFLEX): HIV Screen 4th Generation wRfx: NONREACTIVE

## 2024-03-03 LAB — CBC
HCT: 45.9 % (ref 36.0–46.0)
Hemoglobin: 14.6 g/dL (ref 12.0–15.0)
MCH: 30.7 pg (ref 26.0–34.0)
MCHC: 31.8 g/dL (ref 30.0–36.0)
MCV: 96.6 fL (ref 80.0–100.0)
Platelets: 265 10*3/uL (ref 150–400)
RBC: 4.75 MIL/uL (ref 3.87–5.11)
RDW: 14.9 % (ref 11.5–15.5)
WBC: 10.9 10*3/uL — ABNORMAL HIGH (ref 4.0–10.5)
nRBC: 0 % (ref 0.0–0.2)

## 2024-03-03 LAB — TROPONIN I (HIGH SENSITIVITY)
Troponin I (High Sensitivity): 9 ng/L (ref <18)
Troponin I (High Sensitivity): 10 ng/L (ref <18)

## 2024-03-03 LAB — URINALYSIS, W/ REFLEX TO CULTURE (INFECTION SUSPECTED)
Bacteria, UA: NONE SEEN
Bilirubin Urine: NEGATIVE
Glucose, UA: NEGATIVE mg/dL
Hgb urine dipstick: NEGATIVE
Ketones, ur: NEGATIVE mg/dL
Leukocytes,Ua: NEGATIVE
Nitrite: NEGATIVE
Protein, ur: NEGATIVE mg/dL
Specific Gravity, Urine: 1.045 — ABNORMAL HIGH (ref 1.005–1.030)
pH: 7 (ref 5.0–8.0)

## 2024-03-03 LAB — BLOOD GAS, VENOUS
Acid-Base Excess: 3.8 mmol/L — ABNORMAL HIGH (ref 0.0–2.0)
Bicarbonate: 29.7 mmol/L — ABNORMAL HIGH (ref 20.0–28.0)
Drawn by: 66297
O2 Saturation: 21.3 %
Patient temperature: 36.6
pCO2, Ven: 47 mmHg (ref 44–60)
pH, Ven: 7.41 (ref 7.25–7.43)
pO2, Ven: <31 mmHg — CL (ref 32–45)

## 2024-03-03 LAB — HEMOGLOBIN A1C
Hgb A1c MFr Bld: 5.5 % (ref 4.8–5.6)
Mean Plasma Glucose: 111.15 mg/dL

## 2024-03-03 LAB — RESP PANEL BY RT-PCR (RSV, FLU A&B, COVID)  RVPGX2
Influenza A by PCR: NEGATIVE
Influenza B by PCR: NEGATIVE
Resp Syncytial Virus by PCR: NEGATIVE
SARS Coronavirus 2 by RT PCR: NEGATIVE

## 2024-03-03 LAB — DIFFERENTIAL
Abs Immature Granulocytes: 0.05 10*3/uL (ref 0.00–0.07)
Basophils Absolute: 0 10*3/uL (ref 0.0–0.1)
Basophils Relative: 0 %
Eosinophils Absolute: 1.1 10*3/uL — ABNORMAL HIGH (ref 0.0–0.5)
Eosinophils Relative: 10 %
Immature Granulocytes: 1 %
Lymphocytes Relative: 27 %
Lymphs Abs: 3 10*3/uL (ref 0.7–4.0)
Monocytes Absolute: 0.8 10*3/uL (ref 0.1–1.0)
Monocytes Relative: 8 %
Neutro Abs: 5.9 10*3/uL (ref 1.7–7.7)
Neutrophils Relative %: 54 %

## 2024-03-03 LAB — PROTIME-INR
INR: 1.1 (ref 0.8–1.2)
Prothrombin Time: 14 s (ref 11.4–15.2)

## 2024-03-03 LAB — APTT: aPTT: 26 s (ref 24–36)

## 2024-03-03 LAB — AMMONIA: Ammonia: 13 umol/L (ref 9–35)

## 2024-03-03 LAB — POCT I-STAT, CHEM 8
BUN: 20 mg/dL (ref 8–23)
Calcium, Ion: 1.15 mmol/L (ref 1.15–1.40)
Chloride: 111 mmol/L (ref 98–111)
Creatinine, Ser: 1.5 mg/dL — ABNORMAL HIGH (ref 0.44–1.00)
Glucose, Bld: 81 mg/dL (ref 70–99)
HCT: 45 % (ref 36.0–46.0)
Hemoglobin: 15.3 g/dL — ABNORMAL HIGH (ref 12.0–15.0)
Potassium: 4.2 mmol/L (ref 3.5–5.1)
Sodium: 145 mmol/L (ref 135–145)
TCO2: 26 mmol/L (ref 22–32)

## 2024-03-03 LAB — RAPID URINE DRUG SCREEN, HOSP PERFORMED
Amphetamines: NOT DETECTED
Barbiturates: NOT DETECTED
Benzodiazepines: POSITIVE — AB
Cocaine: NOT DETECTED
Opiates: NOT DETECTED
Tetrahydrocannabinol: NOT DETECTED

## 2024-03-03 LAB — CK: Total CK: 44 U/L (ref 38–234)

## 2024-03-03 LAB — FOLATE: Folate: 17.6 ng/mL (ref >5.9)

## 2024-03-03 LAB — VITAMIN B12: Vitamin B-12: 827 pg/mL (ref 180–914)

## 2024-03-03 LAB — TSH: TSH: 0.326 u[IU]/mL — ABNORMAL LOW (ref 0.350–4.500)

## 2024-03-03 LAB — ETHANOL: Alcohol, Ethyl (B): <10 mg/dL (ref <15)

## 2024-03-03 LAB — T4, FREE: Free T4: 1.17 ng/dL — ABNORMAL HIGH (ref 0.61–1.12)

## 2024-03-03 MED ORDER — SENNOSIDES-DOCUSATE SODIUM 8.6-50 MG PO TABS: 1.0000 | ORAL_TABLET | Freq: Every evening | ORAL | Status: DC | PRN

## 2024-03-03 MED ORDER — ASPIRIN 81 MG PO CHEW
81.0000 mg | CHEWABLE_TABLET | Freq: Every day | ORAL | Status: DC
Start: 2024-03-03 — End: 2024-03-04

## 2024-03-03 MED ORDER — EZETIMIBE 10 MG PO TABS
10.0000 mg | ORAL_TABLET | Freq: Every day | ORAL | Status: DC
  Administered 2024-03-05: 10 mg via ORAL
  Filled 2024-03-03: qty 1

## 2024-03-03 MED ORDER — LACTATED RINGERS IV SOLN: INTRAVENOUS | Status: AC

## 2024-03-03 MED ORDER — ATORVASTATIN CALCIUM 40 MG PO TABS
80.0000 mg | ORAL_TABLET | Freq: Every day | ORAL | Status: DC
  Administered 2024-03-05: 80 mg via ORAL
  Filled 2024-03-03: qty 2

## 2024-03-03 MED ORDER — ACETAMINOPHEN 325 MG PO TABS
650.0000 mg | ORAL_TABLET | ORAL | Status: DC | PRN
  Administered 2024-03-05 (×2): 650 mg via ORAL
  Filled 2024-03-03 (×2): qty 2

## 2024-03-03 MED ORDER — VILAZODONE HCL 20 MG PO TABS
10.0000 mg | ORAL_TABLET | Freq: Every day | ORAL | Status: DC
Start: 2024-03-04 — End: 2024-03-06
  Administered 2024-03-05: 10 mg via ORAL
  Filled 2024-03-03 (×3): qty 0.5

## 2024-03-03 MED ORDER — BUSPIRONE HCL 5 MG PO TABS
5.0000 mg | ORAL_TABLET | Freq: Three times a day (TID) | ORAL | Status: DC
  Administered 2024-03-04 – 2024-03-05 (×5): 5 mg via ORAL
  Filled 2024-03-03 (×6): qty 1

## 2024-03-03 MED ORDER — SODIUM CHLORIDE 0.9% FLUSH: 3.0000 mL | INTRAVENOUS | Status: DC | PRN

## 2024-03-03 MED ORDER — STROKE: EARLY STAGES OF RECOVERY BOOK: Freq: Once | Status: AC

## 2024-03-03 MED ORDER — VILAZODONE HCL 10 MG PO TABS: 10.0000 mg | ORAL_TABLET | Freq: Every day | ORAL | Status: DC

## 2024-03-03 MED ORDER — CLOPIDOGREL BISULFATE 75 MG PO TABS
75.0000 mg | ORAL_TABLET | Freq: Every day | ORAL | Status: DC
  Administered 2024-03-05: 75 mg via ORAL
  Filled 2024-03-03: qty 1

## 2024-03-03 MED ORDER — ACETAMINOPHEN 160 MG/5ML PO SOLN: 650.0000 mg | ORAL | Status: DC | PRN

## 2024-03-03 MED ORDER — SODIUM CHLORIDE 0.9 % IV BOLUS
500.0000 mL | Freq: Once | INTRAVENOUS | Status: AC
  Administered 2024-03-03: 500 mL via INTRAVENOUS

## 2024-03-03 MED ORDER — SODIUM CHLORIDE 0.9% FLUSH
3.0000 mL | Freq: Two times a day (BID) | INTRAVENOUS | Status: DC
  Administered 2024-03-03 – 2024-03-05 (×4): 10 mL via INTRAVENOUS

## 2024-03-03 MED ORDER — THIAMINE MONONITRATE 100 MG PO TABS
100.0000 mg | ORAL_TABLET | Freq: Every day | ORAL | Status: DC
  Administered 2024-03-05: 100 mg via ORAL
  Filled 2024-03-03: qty 1

## 2024-03-03 MED ORDER — MYCOPHENOLATE MOFETIL 250 MG PO CAPS
500.0000 mg | ORAL_CAPSULE | Freq: Two times a day (BID) | ORAL | Status: DC
  Administered 2024-03-04 – 2024-03-05 (×3): 500 mg via ORAL
  Filled 2024-03-03 (×4): qty 2

## 2024-03-03 MED ORDER — HEPARIN SODIUM (PORCINE) 5000 UNIT/ML IJ SOLN
5000.0000 [IU] | Freq: Three times a day (TID) | INTRAMUSCULAR | Status: DC
  Administered 2024-03-03 – 2024-03-05 (×7): 5000 [IU] via SUBCUTANEOUS
  Filled 2024-03-03 (×7): qty 1

## 2024-03-03 MED ORDER — PANTOPRAZOLE SODIUM 40 MG PO TBEC
40.0000 mg | DELAYED_RELEASE_TABLET | Freq: Every day | ORAL | Status: DC
  Administered 2024-03-05: 40 mg via ORAL
  Filled 2024-03-03: qty 1

## 2024-03-03 MED ORDER — ACETAMINOPHEN 650 MG RE SUPP: 650.0000 mg | RECTAL | Status: DC | PRN

## 2024-03-03 MED ORDER — IOHEXOL 350 MG/ML SOLN
75.0000 mL | Freq: Once | INTRAVENOUS | Status: AC | PRN
  Administered 2024-03-03: 75 mL via INTRAVENOUS

## 2024-03-03 NOTE — Care Management Obs Status (Signed)
 MEDICARE OBSERVATION STATUS NOTIFICATION   Patient Details  Name: TRYPHENA PERKOVICH MRN: [id number] Date of Birth: Jun 12, 1956   Medicare Observation Status Notification Given:  Yes    Geraldina Klinefelter, RN 03/03/2024, 6:12 PM

## 2024-03-03 NOTE — Progress Notes (Signed)
 [facility] Telemedicine Consult   Requesting Provider: Dr Shaunna [place] Consult Participants: Patient, bedside RN, atrium RN Mylinda Asa Location of the provider: This: Stroke center Location of the patient: [facility] emergency room  This consult was provided via telemedicine with 2-way video and audio communication. The patient/family was informed that care would be provided in this way and agreed to receive care in this manner.    Chief Complaint: "Stroke  HPI: Ms. Height is a 68 year old African-American lady with known history of multiple strokes and spastic left hemiparesis lives in a skilled nursing facility and was brought in today for evaluation for worsening stroke deficits.  Patient's history has discrepancy in.  EMS and ER physician reported history that patient had sudden onset of change today however the patient told me that she has not been feeling well for several days and has been weak all over new focal deficits..  Noncontrast CT scan of the head was obtained which showed multiple old strokes involving right basal ganglia, right superior cerebellum and bilateral subcortical white matter and age-related changes of chronic small vessel    LKW:  ?  3 days ago tnk given?: No, outside time window IR Thrombectomy? No, clinical presentation not compatible with LVO Modified Rankin Scale: 4-Needs assistance to walk and tend to bodily needs Time of teleneurologist evaluation: 1248  Exam: There were no vitals filed for this visit.  General: Frail malnourished looking elderly African-American lady not in distress.  1A: Level of Consciousness - 1 1B: Ask Month and Age - 0 1C: 'Blink Eyes' & 'Squeeze Hands' - 0 2: Test Horizontal Extraocular Movements - 0 3: Test Visual Fields - 0 4: Test Facial Palsy - 1 5A: Test Left Arm Motor Drift - 4 5B: Test Right Arm Motor Drift - 1 6A: Test Left Leg Motor Drift - 4 6B: Test Right Leg Motor Drift - 0 7: Test Limb Ataxia - 1 8: Test  Sensation - 0 9: Test Language/Aphasia- 0 10: Test Dysarthria - 1 11: Test Extinction/Inattention - 0 NIHSS score: 9   Imaging Reviewed: CT head noncontrast study no acute abnormality.  Chronic small vessel disease and old infarcts in right basal ganglia, right superior cerebellum and bilateral thalami.  Labs reviewed in epic and pertinent values follow: All pending   Assessment: 68 year old lady with prior history of multiple strokes and spastic left hemiplegia presents with subacute generalized worsening of her deficits without definite new focal findings over the last few days.  Likely recrudescence of her old deficits.  Doubt new stroke She is not a candidate due to late presentation and lack of clear documented new focal deficits.  Clinical presentation is not compatible with LVO either Recommendations:  Admit to medical team for further workup.  Check MRI scan of the brain, CT angiogram, CBC, CMP, UA and chest x-ray.,  Lipid profile and hemoglobin A1c.  Continue aspirin  for stroke prevention and maintain aggressive risk factor modification.  Discussed with Dr. Kermit Ped ER MD. Long discussion with patient's son Magie Ciampa over the phone and answered questions about her presentation, evaluation and treatment plan.   This patient is receiving care for possible acute neurological changes. There was 70 minutes of care by this provider at the time of service, including time for direct evaluation via telemedicine, review of medical records, imaging studies and discussion of findings with providers, the patient and/or family.  Ardella Beaver MD [facility] [phone]  If 7pm- 7am, please page neurology on call as listed in AMION.

## 2024-03-03 NOTE — ED Triage Notes (Signed)
 Pt brought in by [facility] from [facility] Kara Bailey with c/o increased weakness and worsening speech difficulties that started around 1145. Pt has left sided deficits from previous stroke. EMS unsure how much of the weakness and speech difficulty is her baseline. CODE STROKE activated by PA Rigney at time of arrival to ED.

## 2024-03-03 NOTE — Progress Notes (Signed)
 1232 Code stroke activated. Brought in by EMS from facility with reported increased weakness and trouble speaking from baseline. Hx CVA with left sided deficits. MRS 4. Seen by provider prior to cart connection.   1234 Pt taken to CT via stretcher.  1236 Teleneurologist paged. 1246 Backup teleneurologist paged. 1248 Dr. Janett Medin connected to telestroke cart to begin code stroke assessment. NCCT just completed. 1252 Pt returned to ED from CT. Exam completed. 1306 Code stroke session complete. LKW possibly up to 3 days ago. No TNK due to being outside of administration window. Dr. Janett Medin states he will call the patient's son. Telestroke RN disconnected from cart.

## 2024-03-03 NOTE — TOC Initial Note (Signed)
 Transition of Care [facility]) - Initial/Assessment Note    Patient Details  Name: Kara Bailey MRN: [id number] Date of Birth: June 13, 1956  Transition [facility]) CM/SW Contact:    Geraldina Klinefelter, RN Phone Number: 03/03/2024, 8:03 PM  Clinical Narrative:                 Pt c/history of stroke and left hemiparesis. Report of weakness and dysarthria. Neurology consult completed, ST to eval tomorrow. Current resident of [facility] and plans to return there at dc. TOC to follow.    Expected Discharge Plan: Skilled Nursing Facility Barriers to Discharge: Continued Medical Work up   Patient Goals and CMS Choice Patient states their goals for this hospitalization and ongoing recovery are:: Return to baseline. CMS Medicare.gov Compare Post Acute Care list provided to:: Patient Choice offered to / list presented to : Patient [place] ownership interest in [facility].provided to:: Patient    Expected Discharge Plan and Services In-house Referral: Clinical Social Work Discharge Planning Services: CM Consult Post Acute Care Choice: Skilled Nursing Facility Living arrangements for the past 2 months: Skilled Nursing Facility                 Prior Living Arrangements/Services Living arrangements for the past 2 months: Skilled Nursing Facility Lives with:: Facility Resident Patient language and need for interpreter reviewed:: Yes Do you feel safe going back to the place where you live?: Yes      Need for Family Participation in Patient Care: Yes (Comment) Care giver support system in place?: Yes (comment)   Criminal Activity/Legal Involvement Pertinent to Current Situation/Hospitalization: No - Comment as needed  Activities of Daily Living  Permission Sought/Granted  Emotional Assessment   Attitude/Demeanor/Rapport: Engaged Affect (typically observed): Accepting, Calm Orientation: : Oriented to Self, Oriented to Place Alcohol / Substance Use: Not Applicable Psych  Involvement: No (comment)  Admission diagnosis:  Dysarthria [R47.1] Generalized weakness [R53.1] AKI (acute kidney injury) (HCC) [N17.9] Patient Active Problem List   Diagnosis Date Noted   Acute metabolic encephalopathy 03/03/2024   Generalized weakness 03/03/2024   Hemiparesis affecting left side as late effect of cerebrovascular accident (HCC) 02/18/2024   Dysarthria 02/18/2024   Decreased hemoglobin 12/28/2022   Heme positive stool 12/28/2022   Protein-calorie malnutrition, severe 12/04/2022   History of stroke 11/29/2022   Dysphagia as late effect of cerebrovascular accident (CVA) 11/29/2022   Stroke (HCC) 07/26/2022   Left hemiplegia (HCC) 07/26/2022   Peripheral arterial disease (HCC) 12/20/2020   History of esophageal stricture 07/08/2020   Insomnia 12/02/2017   Chronic cough 08/14/2017   Poor appetite 08/29/2016   Peptic stricture of esophagus    History of colonic polyps    Hiatal hernia 03/16/2016   Dysphagia 03/16/2016   Alcohol abuse 10/26/2015   Essential hypertension 09/26/2015   PAC (premature atrial contraction) 10/11/2014   PVC (premature ventricular contraction) 10/11/2014   Back muscle spasm 06/15/2014   Generalized anxiety disorder 06/15/2014   Smoking 06/15/2014   Chronic hepatitis C without hepatic coma (HCC) 05/28/2013   PCP:  Forestine Igo, DO Pharmacy:   [facility] - [place], [place] - [address] [place] [address] Phone: [phone] Fax: [phone]     Social Drivers of Health (SDOH) Social History: SDOH Screenings   Food Insecurity: Patient Declined (12/04/2022)  Housing: Low Risk  (12/04/2022)  Transportation Needs: Patient Declined (12/04/2022)  Utilities: Patient Declined (12/04/2022)  Depression (PHQ2-9): High Risk (09/19/2022)  Social Connections: Unknown (11/29/2022)   [facility]  Tobacco Use: Medium Risk (03/03/2024)   SDOH Interventions:  Readmission  Risk Interventions    12/17/2022    1:42 PM  Readmission Risk Prevention Plan  Transportation Screening Complete  PCP or Specialist Appt within 3-5 Days Complete  HRI or Home Care Consult Complete  Social Work Consult for Recovery Care Planning/Counseling Complete  Palliative Care Screening Not Applicable  Medication Review Oceanographer) Referral to Pharmacy

## 2024-03-03 NOTE — Plan of Care (Signed)

## 2024-03-03 NOTE — ED Provider Notes (Signed)
 [place] EMERGENCY DE[facility]MENT AT [facility] Provider Note   CSN: [id number] Arrival date & time: 03/03/24  1232  An emergency department physician performed an initial assessment on this suspected stroke patient at 2.  History  Chief Complaint  Patient presents with   Weakness    Kara Bailey is a 68 y.o. female.  Patient is a 68 year old female who presents to the emergency department by EMS from her long-term care facility.  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  It is noted by EMS that approximately 50 minutes ago the patient did have worsening weakness at her facility as well as associated expressive aphasia.  Patient notes that she is not currently having any active pain at this time.  Patient is able to speak on arrival but is slow.  Patient denies any active headache, pain to neck or back.  There was no associated syncopal events.  She has had no associated chest pain, shortness breath, abdominal pain, nausea, vomiting, diarrhea.  Patient normally gets around by wheelchair.   Weakness      Home Medications Prior to Admission medications   Medication Sig Start Date End Date Taking? Authorizing Provider  acetaminophen  (TYLENOL ) 325 MG tablet Take 1-2 tablets (325-650 mg total) by mouth every 4 (four) hours as needed for mild pain. 09/06/22   Setzer, Sandra J, PA-C  albuterol  (VENTOLIN  HFA) 108 (90 Base) MCG/ACT inhaler Inhale 2 puffs into the lungs every 6 (six) hours as needed for wheezing or shortness of breath. Patient not taking: Reported on 02/18/2024    [provider]  ALPRAZolam  (XANAX ) 0.25 MG tablet Take 2 tablets (0.5 mg total) by mouth daily as needed for anxiety. 12/07/22   Leona Rake, MD  aspirin  81 MG chewable tablet Place 1 tablet (81 mg total) into feeding tube daily. 12/17/22   Lavaughn Portland, MD  atorvastatin  (LIPITOR ) 80 MG tablet Take 1 tablet (80 mg total) by mouth daily. 09/06/22   Setzer, Sandra J,  PA-C  Baclofen  5 MG TABS Take 1 tablet by mouth every 8 (eight) hours as needed (pain). Patient not taking: Reported on 02/18/2024 11/24/22   [provider]  busPIRone  (BUSPAR ) 5 MG tablet Take 5 mg by mouth 3 (three) times daily.    [provider]  Calcium  Carb-Cholecalciferol (CALCIUM  1000 + D) 1000-20 MG-MCG TABS Take 1 tablet by mouth daily. 05/02/23   [provider]  citalopram  (CELEXA ) 10 MG tablet Take 10 mg by mouth every morning. Patient not taking: Reported on 02/18/2024 10/31/22   [provider]  clopidogrel  (PLAVIX ) 75 MG tablet Take 1 tablet (75 mg total) by mouth daily. 12/20/22   Avanell Leigh, MD  diphenoxylate -atropine  (LOMOTIL ) 2.5-0.025 MG tablet Place 2 tablets into feeding tube 4 (four) times daily. 01/14/23   Vada Garibaldi, MD  ezetimibe  (ZETIA ) 10 MG tablet Take 10 mg by mouth daily.    [provider]  famotidine  (PEPCID ) 40 MG tablet Take 40 mg by mouth at bedtime. 10/25/20   [provider]  feeding supplement (ENSURE ENLIVE / ENSURE PLUS) LIQD Take 237 mLs by mouth 2 (two) times daily between meals. 12/07/22   Adhikari, Amrit, MD  fluticasone (FLONASE) 50 MCG/ACT nasal spray Place into both nostrils daily.    [provider]  HYDROcodone -acetaminophen  (NORCO/VICODIN) 5-325 MG tablet Take 2 tablets by mouth every 4 (four) hours as needed. 03/20/23   Robinson, [name] K, PA-C  hydroxypropyl methylcellulose / hypromellose (ISOPTO  TEARS / GONIOVISC) 2.5 % ophthalmic solution Place 1 drop into both eyes in the morning and at bedtime.    [provider]  hydrOXYzine (ATARAX) 50 MG tablet Take 50 mg by mouth every 8 (eight) hours as needed for anxiety.    [provider]  magnesium  oxide (MAG-OX) 400 (240 Mg) MG tablet Place 2 tablets (800 mg total) into feeding tube 2 (two) times daily. 12/07/22   Adhikari, Amrit, MD  melatonin 3 MG TABS tablet Take 1 tablet (3 mg total) by mouth at bedtime. 09/06/22    Setzer, Hamp Levine, PA-C  mirtazapine  (REMERON ) 15 MG tablet Take 15 mg by mouth at bedtime. 09/27/22   [provider]  Multiple Vitamin (MULTIVITAMIN WITH MINERALS) TABS tablet Take 1 tablet by mouth daily. 12/31/20   Rizwan, Saima, MD  mycophenolate  (CELLCEPT ) 500 MG tablet Take by mouth 2 (two) times daily.    [provider]  Nutritional Supplements (FEEDING SUPPLEMENT, JEVITY 1.5 CAL/FIBER,) LIQD Place 1,000 mLs into feeding tube daily. 01/15/23   Vada Garibaldi, MD  Nutritional Supplements (FEEDING SUPPLEMENT, OSMOLITE 1.5 CAL,) LIQD Place 1,000 mLs into feeding tube continuous. Patient not taking: Reported on 02/18/2024 12/07/22   Leona Rake, MD  ondansetron  (ZOFRAN -ODT) 8 MG disintegrating tablet Take 1 tablet (8 mg total) by mouth every 8 (eight) hours as needed for nausea or vomiting. Patient not taking: Reported on 02/18/2024 02/05/23   Alissa April, MD  polyethylene glycol (MIRALAX  / GLYCOLAX ) 17 g packet Take 17 g by mouth daily as needed. Patient not taking: Reported on 02/18/2024 09/06/22   Setzer, Sandra J, PA-C  Protein (FEEDING SUPPLEMENT, PROSOURCE TF20,) liquid Place 60 mLs into feeding tube 3 (three) times daily between meals. Patient not taking: Reported on 02/18/2024 01/14/23   Vada Garibaldi, MD  senna-docusate (SENOKOT-S) 8.6-50 MG tablet Take 1 tablet by mouth 2 (two) times daily. Patient not taking: Reported on 02/18/2024 09/06/22   Setzer, Sandra J, PA-C  thiamine  (VITAMIN B-1) 100 MG tablet Take 100 mg by mouth daily.    [provider]  tiZANidine  (ZANAFLEX ) 2 MG tablet TAKE 1 TABLET(2 MG) BY MOUTH AT BEDTIME Patient not taking: Reported on 02/18/2024 10/15/22   Genetta Kenning, MD  traMADol  (ULTRAM ) 50 MG tablet Take 1 tablet (50 mg total) by mouth every 6 (six) hours as needed for moderate pain. Patient not taking: Reported on 02/18/2024 12/07/22   Leona Rake, MD  Vilazodone  HCl (VIIBRYD ) 10 MG TABS Take 10 mg by mouth daily. 02/10/24   [provider]  Water  For Irrigation, Sterile (FREE WATER ) SOLN Place 100 mLs into feeding tube every 4 (four) hours. Patient not taking: Reported on 02/18/2024 12/07/22   Leona Rake, MD      Allergies    Patient has no known allergies.    Review of Systems   Review of Systems  Neurological:  Positive for weakness.  All other systems reviewed and are negative.   Physical Exam Updated Vital Signs BP (!) 145/75 (BP Location: Right Wrist)   Pulse 75   Temp 97.8 F (36.6 C) (Oral)   Resp 19   Ht 5\' 6"  (1.676 m)   Wt 71.2 kg   SpO2 95%   BMI 25.34 kg/m  Physical Exam Vitals and nursing note reviewed.  Constitutional:      Appearance: Normal appearance.  HENT:     Head: Normocephalic and atraumatic.     Nose: Nose normal.     Mouth/Throat:  Mouth: Mucous membranes are moist.  Eyes:     Extraocular Movements: Extraocular movements intact.     Conjunctiva/sclera: Conjunctivae normal.     Pupils: Pupils are equal, round, and reactive to light.  Cardiovascular:     Rate and Rhythm: Normal rate and regular rhythm.     Pulses: Normal pulses.     Heart sounds: Normal heart sounds. No murmur heard.    No gallop.  Pulmonary:     Effort: No respiratory distress.     Breath sounds: Normal breath sounds. No stridor. No wheezing, rhonchi or rales.  Abdominal:     General: Abdomen is flat. Bowel sounds are normal. There is no distension.     Palpations: Abdomen is soft.     Tenderness: There is no abdominal tenderness. There is no guarding.  Musculoskeletal:        General: Normal range of motion.     Cervical back: Normal range of motion and neck supple. No rigidity or tenderness.     Right lower leg: No edema.     Left lower leg: No edema.  Skin:    General: Skin is warm and dry.     Findings: No bruising or rash.  Neurological:     General: No focal deficit present.     Mental Status: She is alert and oriented to person, place, and time. Mental status is at baseline.      Comments: Hemiplegia noted to left upper and lower extremity, strength 4 out of 5 and right upper and lower extremity, right-sided facial droop noted, speech is slurred, sensation intact bilaterally, unable to assess coordination on the left, unable to assess gait  Psychiatric:        Mood and Affect: Mood normal.        Behavior: Behavior normal.        Thought Content: Thought content normal.        Judgment: Judgment normal.     ED Results / Procedures / Treatments   Labs (all labs ordered are listed, but only abnormal results are displayed) Labs Reviewed  CBC - Abnormal; Notable for the following components:      Result Value   WBC 10.9 (*)    All other components within normal limits  DIFFERENTIAL - Abnormal; Notable for the following components:   Eosinophils Absolute 1.1 (*)    All other components within normal limits  COMPREHENSIVE METABOLIC PANEL WITH GFR - Abnormal; Notable for the following components:   Creatinine, Ser 1.29 (*)    GFR, Estimated 45 (*)    All other components within normal limits  POCT I-STAT, CHEM 8 - Abnormal; Notable for the following components:   Creatinine, Ser 1.50 (*)    Hemoglobin 15.3 (*)    All other components within normal limits  ETHANOL  PROTIME-INR  APTT  RAPID URINE DRUG SCREEN, HOSP PERFORMED  URINALYSIS, ROUTINE W REFLEX MICROSCOPIC  I-STAT CHEM 8, ED  TROPONIN I (HIGH SENSITIVITY)    EKG None  Radiology CT HEAD CODE STROKE WO CONTRAST Result Date: 03/03/2024 CLINICAL DATA:  Code stroke. Neuro deficit, acute, stroke suspected. Abnormal speech. Increased weakness. EXAM: CT HEAD WITHOUT CONTRAST TECHNIQUE: Contiguous axial images were obtained from the base of the skull through the vertex without intravenous contrast. RADIATION DOSE REDUCTION: This exam was performed according to the departmental dose-optimization program which includes automated exposure control, adjustment of the mA and/or kV according to patient size  and/or use of iterative reconstruction technique. COMPARISON:  Brain MRI 12/01/2022. FINDINGS: Brain: Generalized cerebral and cerebellar atrophy. 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 Background mild patchy and ill-defined hypoattenuation within the cerebral white matter, nonspecific but compatible chronic small vessel ischemic disease. There is no acute intracranial hemorrhage. No demarcated cortical infarct. No extra-axial fluid collection. No evidence of an intracranial mass. No midline shift. Vascular: No hyperdense vessel.  Atherosclerotic calcifications. Skull: No calvarial fracture or aggressive osseous lesion. Sinuses/Orbits: No mass or acute finding within the imaged orbits. No significant paranasal sinus disease at the imaged levels. ASPECTS [facility] Stroke Program Early CT Score) - Ganglionic level infarction (caudate, lentiform nuclei, internal capsule, insula, M1-M3 cortex): 7 - Supraganglionic infarction (M4-M6 cortex): 3 Total score (0-10 with 10 being normal): 10 (when discounting the chronic infarcts described above). No evidence of an acute intracranial abnormality. These results were communicated to Dr. Janett Medin at 1:00 pmon 4/22/2025by text page via the [facility] messaging system. IMPRESSION: 1.  No evidence of an acute intracranial abnormality. 2. Parenchymal atrophy, chronic small vessel ischemic disease and chronic infarcts, as described. Electronically Signed   By: Bascom Lily D.O.   On: 03/03/2024 13:01    Procedures Procedures    Medications Ordered in ED Medications  sodium chloride  0.9 % bolus 500 mL (500 mLs Intravenous New Bag/Given 03/03/24 1344)  iohexol  (OMNIPAQUE ) 350 MG/ML injection 75 mL (75 mLs Intravenous Contrast Given 03/03/24 1345)    ED Course/ Medical Decision Making/ A&P                                 Medical Decision Making Amount and/or Complexity of Data  Reviewed Labs: ordered. Radiology: ordered.  Risk Prescription drug management. Decision regarding hospitalization.   This patient presents to the ED for concern of weakness, aphasia, this involves an extensive number of treatment options, and is a complaint that carries with it a high risk of complications and morbidity.  The differential diagnosis includes CVA, TIA, metabolic encephalopathy, electrolyte derangement, acute kidney injury, sepsis, tract infection   Co morbidities that complicate the patient evaluation  Previous CVA   Additional history obtained:  Additional history obtained from EMS External records from outside source obtained and reviewed including medical records   Lab Tests:  I Ordered, and personally interpreted labs.  The pertinent results include: Mild AKI, normal electrolytes, normal liver function, mild leukocytosis, no anemia, negative troponin   Imaging Studies ordered:  I ordered imaging studies including CT scan of head I independently visualized and interpreted imaging which showed no acute intracranial pathology I agree with the radiologist interpretation   Cardiac Monitoring: / EKG:  The patient was maintained on a cardiac monitor.  I personally viewed and interpreted the cardiac monitored which showed an underlying rhythm of: Normal sinus rhythm, no ST changes, nonspecific T wave changes, no STEMI   Consultations Obtained:  I requested consultation with the neurology, Dr. Janett Medin,  and discussed lab and imaging findings as well as pertinent plan - they recommend: Admission   Problem List / ED Course / Critical interventions / Medication management  Patient does remain stable at this time.  Patient has been started on IV fluids for her mild AKI.  Blood work has otherwise been unremarkable.  CT scan of the head demonstrated no acute hemorrhage or other acute pathology.  She does have chronic ischemic changes.  Initially stroke alert was  initiated  and patient was evaluated by neurology.  They do recommend admission for further workup at this time.  Patient has no obvious indication for infectious process though her urine is still pending.  Did discuss patient case with Dr. Winferd Hatter with the hospitalist service who has excepted at this time. I ordered medication including IV fluids for AKI Reevaluation of the patient after these medicines showed that the patient improved I have reviewed the patients home medicines and have made adjustments as needed   Social Determinants of Health:  Lives at long-term care facility   Test / Admission - Considered:  Admission        Final Clinical Impression(s) / ED Diagnoses Final diagnoses:  None    Rx / DC Orders ED Discharge Orders     None         Emmalene Hare 03/03/24 1416    Iva Mariner, MD 03/03/24 1656

## 2024-03-03 NOTE — Hospital Course (Signed)
 68 year old female with a history of stroke and left hemiparesis, anxiety, depression, hyperlipidemia presenting with speech abnormality.  Apparently, the patient resides at [facility].  She was sent over because of concerns of her speech and possibly her dysarthria.  Attempts were made to contact the facility without success x 3.  In speaking with the patient's son, he stated that he last saw the patient about 2 weeks prior to this admission.  According to son, the patient is wheelchair-bound at baseline, but she is able to feed herself.  He states that she is normally able to speak coherently although sometimes a little bit slow.  She is sometimes slow to process information.  However, he states that she is normally able to state her name, and place and occasionally the date.  In the ED, the patient is awake and alert.  She denies any chest pain, shortness of breath, vomiting, diarrhea.  She does complain of some abdominal pain.  She states that has been going on for about 3 days.  She denies any dysuria.  She denies any headache or neck pain. In the the patient is afebrile hemodynamically stable. WBC 10.9, hemoglobin 14.6, platelets 265.  Sodium 145, potassium 4.2, bicarbonate 26, serum creatinine 1.29.  LFTs were unremarkable.  Troponin 9.  EKG shows sinus rhythm with nonspecific T wave changes.  CT of the brain was negative for any acute findings.  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  The patient was seen by neurology who recommended admission for further workup.  They did not feel the patient qualified for thrombolytics.

## 2024-03-03 NOTE — H&P (Signed)
 History and Physical    Patient: Kara Bailey YQM:[id number] DOB: [birth year] DOA: 03/03/2024 DOS: the patient was seen and examined on 03/03/2024 PCP: Forestine Igo, DO  Patient coming from: SNF  Chief Complaint:  Chief Complaint  Patient presents with   Weakness   HPI: Kara Bailey is a 68 year old female with a history of stroke and left hemiparesis, anxiety, depression, hyperlipidemia presenting with speech abnormality.  Apparently, the patient resides at [facility].  She was sent over because of concerns of her speech and possibly her dysarthria.  Attempts were made to contact the facility without success x 3.  In speaking with the patient's son, he stated that he last saw the patient about 2 weeks prior to this admission.  According to son, the patient is wheelchair-bound at baseline, but she is able to feed herself.  He states that she is normally able to speak coherently although sometimes a little bit slow.  She is sometimes slow to process information.  However, he states that she is normally able to state her name, and place and occasionally the date.  In the ED, the patient is awake and alert.  She denies any chest pain, shortness of breath, vomiting, diarrhea.  She does complain of some abdominal pain.  She states that has been going on for about 3 days.  She denies any dysuria.  She denies any headache or neck pain. In the the patient is afebrile hemodynamically stable. WBC 10.9, hemoglobin 14.6, platelets 265.  Sodium 145, potassium 4.2, bicarbonate 26, serum creatinine 1.29.  LFTs were unremarkable.  Troponin 9.  EKG shows sinus rhythm with nonspecific T wave changes.  CT of the brain was negative for any acute findings.  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  The patient was seen by neurology who recommended admission for further workup.  They did not feel the patient qualified for thrombolytics.  Review of Systems:  As mentioned in the history of present illness. All other systems reviewed and are negative. Past Medical History:  Diagnosis Date   Acute CVA (cerebrovascular accident) (HCC) 12/29/2020   Anxiety    Chest pain 08/2014   unspecified   Cirrhosis (HCC)    CVA (cerebral vascular accident) (HCC) 07/27/2022   Depression    Dizziness 08/2014   Dysphagia following cerebral infarction    Dyspnea 08/2014   Dysrhythmia    Emphysema lung (HCC)    GERD (gastroesophageal reflux disease)    Heart murmur    Hemiplegia, unspecified affecting left nondominant side (HCC)    Hepatitis C    Hiatal hernia    Hyperlipidemia    Hypertension    Insomnia    Ischemic stroke (HCC) 08/01/2022   Muscle weakness (generalized)    Other muscle spasm    PAC (premature atrial contraction) 10/11/2014   PAD (peripheral artery disease) (HCC)    Palpitations    PVC (premature ventricular contraction) 10/11/2014   PVD (peripheral vascular disease) (HCC)    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   Stroke (HCC)    just slurred speech   Tachycardia 08/2014   Ventral hernia    Weakness 08/2014   Past Surgical History:  Procedure Laterality Date   ABDOMINAL AORTOGRAM W/LOWER EXTREMITY N/A 07/13/2021   Procedure: ABDOMINAL AORTOGRAM W/LOWER EXTREMITY;  Surgeon: Avanell Leigh, MD;  Location: [facility];  Service: Cardiovascular;  Laterality: N/A;   ABDOMINAL AORTOGRAM W/LOWER EXTREMITY  N/A 03/19/2022   Procedure: ABDOMINAL AORTOGRAM W/LOWER EXTREMITY;  Surgeon: Avanell Leigh, MD;  Location: [facility] INVASIVE CV LAB;  Service: Cardiovascular;  Laterality: N/A;   APPENDECTOMY     CATARACT EXTRACTION, BILATERAL     COLONOSCOPY     COLONOSCOPY WITH PROPOFOL  N/A 04/30/2016   Procedure: COLONOSCOPY WITH PROPOFOL ;  Surgeon: Suzette Espy, MD;  Location: [facility];  Service: Endoscopy;  Laterality: N/A;  1230   EPIGASTRIC HERNIA REPAIR N/A 10/13/2021   Procedure: OPEN REPAIR EPIGASTRIC HERNIA WITH  MESH PATCH;  Surgeon: Oralee Billow, MD;  Location: [facility] ORS;  Service: General;  Laterality: N/A;   ESOPHAGOGASTRODUODENOSCOPY     approximately 2010   ESOPHAGOGASTRODUODENOSCOPY (EGD) WITH PROPOFOL  N/A 04/30/2016   Procedure: ESOPHAGOGASTRODUODENOSCOPY (EGD) WITH PROPOFOL ;  Surgeon: Suzette Espy, MD;  Location: [facility];  Service: Endoscopy;  Laterality: N/A;   ESOPHAGOGASTRODUODENOSCOPY (EGD) WITH PROPOFOL  N/A 12/28/2022   Procedure: ESOPHAGOGASTRODUODENOSCOPY (EGD) WITH PROPOFOL ;  Surgeon: Kenney Peacemaker, MD;  Location: [facility] ENDOSCOPY;  Service: Gastroenterology;  Laterality: N/A;   IR GASTROSTOMY TUBE MOD SED  12/03/2022   IR GASTROSTOMY TUBE REMOVAL  09/12/2023   LEFT HEART CATHETERIZATION WITH CORONARY ANGIOGRAM N/A 10/29/2014   07-14-20- pt denies this Procedure: LEFT HEART CATHETERIZATION WITH CORONARY ANGIOGRAM;  Surgeon: Odie Benne, MD;  Location: [facility] CATH LAB;  Service: Cardiovascular;  Laterality: N/A;   MALONEY DILATION N/A 04/30/2016   Procedure: Londa Rival DILATION;  Surgeon: Suzette Espy, MD;  Location: [facility];  Service: Endoscopy;  Laterality: N/A;   PERIPHERAL VASCULAR INTERVENTION  07/13/2021   Procedure: PERIPHERAL VASCULAR INTERVENTION;  Surgeon: Avanell Leigh, MD;  Location: [facility];  Service: Cardiovascular;;  left SFA left external iliac   PERIPHERAL VASCULAR INTERVENTION Right 03/19/2022   Procedure: PERIPHERAL VASCULAR INTERVENTION;  Surgeon: Avanell Leigh, MD;  Location: [facility];  Service: Cardiovascular;  Laterality: Right;  SFA   POLYPECTOMY  04/30/2016   Procedure: POLYPECTOMY;  Surgeon: Suzette Espy, MD;  Location: [facility];  Service: Endoscopy;;  Sigmoid colon polyp removed via hot snare   UPPER GASTROINTESTINAL ENDOSCOPY     Social History:  reports that she has quit smoking. Her smoking use included cigarettes. She started smoking about 55 years ago. She has a 27.7 pack-year smoking history. She has never used  smokeless tobacco. She reports that she does not currently use alcohol. She reports that she does not use drugs.  No Known Allergies  Family History  Problem Relation Age of Onset   Heart failure Mother    Heart attack Mother    Heart disease Mother    Hypertension Mother    Hyperlipidemia Mother    CVA Father    Alzheimer's disease Father    Cancer Neg Hx    Colon cancer Neg Hx    Esophageal cancer Neg Hx    Rectal cancer Neg Hx    Stomach cancer Neg Hx     Prior to Admission medications   Medication Sig Start Date End Date Taking? Authorizing Provider  acetaminophen  (TYLENOL ) 325 MG tablet Take 1-2 tablets (325-650 mg total) by mouth every 4 (four) hours as needed for mild pain. 09/06/22   Setzer, Sandra J, PA-C  albuterol  (VENTOLIN  HFA) 108 (90 Base) MCG/ACT inhaler Inhale 2 puffs into the lungs every 6 (six) hours as needed for wheezing or shortness of breath. Patient not taking: Reported on 02/18/2024    [provider]  ALPRAZolam  (  XANAX ) 0.5 MG tablet Take 0.5 mg by mouth 3 (three) times daily as needed for anxiety. 02/25/24   [provider]  aspirin  81 MG chewable tablet Place 1 tablet (81 mg total) into feeding tube daily. 12/17/22   Lavaughn Portland, MD  atorvastatin  (LIPITOR ) 80 MG tablet Take 1 tablet (80 mg total) by mouth daily. 09/06/22   Setzer, Sandra J, PA-C  Baclofen  5 MG TABS Take 1 tablet by mouth every 8 (eight) hours as needed (pain). Patient not taking: Reported on 02/18/2024 11/24/22   [provider]  busPIRone  (BUSPAR ) 5 MG tablet Take 5 mg by mouth 3 (three) times daily.    [provider]  Calcium  Carb-Cholecalciferol (CALCIUM  1000 + D) 1000-20 MG-MCG TABS Take 1 tablet by mouth daily. 05/02/23   [provider]  citalopram  (CELEXA ) 10 MG tablet Take 10 mg by mouth every morning. Patient not taking: Reported on 02/18/2024 10/31/22   [provider]  clopidogrel  (PLAVIX ) 75 MG tablet Take 1 tablet (75 mg total) by mouth  daily. 12/20/22   Avanell Leigh, MD  diphenoxylate -atropine  (LOMOTIL ) 2.5-0.025 MG tablet Place 2 tablets into feeding tube 4 (four) times daily. 01/14/23   Vada Garibaldi, MD  ezetimibe  (ZETIA ) 10 MG tablet Take 10 mg by mouth daily.    [provider]  famotidine  (PEPCID ) 20 MG tablet Take 20 mg by mouth 2 (two) times daily. 02/08/24   [provider]  feeding supplement (ENSURE ENLIVE / ENSURE PLUS) LIQD Take 237 mLs by mouth 2 (two) times daily between meals. 12/07/22   Adhikari, Amrit, MD  fluconazole (DIFLUCAN) 150 MG tablet Take 150 mg by mouth once. 02/29/24   [provider]  fluticasone (FLONASE) 50 MCG/ACT nasal spray Place into both nostrils daily.    [provider]  HYDROcodone -acetaminophen  (NORCO/VICODIN) 5-325 MG tablet Take 2 tablets by mouth every 4 (four) hours as needed. 03/20/23   Robinson, [name] K, PA-C  hydroxypropyl methylcellulose / hypromellose (ISOPTO TEARS / GONIOVISC) 2.5 % ophthalmic solution Place 1 drop into both eyes in the morning and at bedtime.    [provider]  hydrOXYzine (ATARAX) 25 MG tablet Take 25 mg by mouth 3 (three) times daily. 02/14/24   [provider]  magnesium  oxide (MAG-OX) 400 (240 Mg) MG tablet Place 2 tablets (800 mg total) into feeding tube 2 (two) times daily. 12/07/22   Adhikari, Amrit, MD  melatonin 3 MG TABS tablet Take 1 tablet (3 mg total) by mouth at bedtime. 09/06/22   Setzer, Hamp Levine, PA-C  mirtazapine  (REMERON ) 30 MG tablet Take 30 mg by mouth at bedtime. 03/02/24   [provider]  Multiple Vitamin (MULTIVITAMIN WITH MINERALS) TABS tablet Take 1 tablet by mouth daily. 12/31/20   Rizwan, Saima, MD  mycophenolate  (CELLCEPT ) 500 MG tablet Take by mouth 2 (two) times daily.    [provider]  Nutritional Supplements (FEEDING SUPPLEMENT, JEVITY 1.5 CAL/FIBER,) LIQD Place 1,000 mLs into feeding tube daily. 01/15/23   Vada Garibaldi, MD  Nutritional Supplements (FEEDING  SUPPLEMENT, OSMOLITE 1.5 CAL,) LIQD Place 1,000 mLs into feeding tube continuous. Patient not taking: Reported on 02/18/2024 12/07/22   Leona Rake, MD  ondansetron  (ZOFRAN -ODT) 8 MG disintegrating tablet Take 1 tablet (8 mg total) by mouth every 8 (eight) hours as needed for nausea or vomiting. Patient not taking: Reported on 02/18/2024 02/05/23   Alissa April, MD  oxycodone  (OXY-IR) 5 MG capsule Take 5 mg by mouth 4 (four) times daily as needed.  01/30/24   [provider]  pantoprazole  (PROTONIX ) 40 MG tablet Take 40 mg by mouth daily. 10/23/23   [provider]  polyethylene glycol (MIRALAX  / GLYCOLAX ) 17 g packet Take 17 g by mouth daily as needed. Patient not taking: Reported on 02/18/2024 09/06/22   Setzer, Sandra J, PA-C  Protein (FEEDING SUPPLEMENT, PROSOURCE TF20,) liquid Place 60 mLs into feeding tube 3 (three) times daily between meals. Patient not taking: Reported on 02/18/2024 01/14/23   Vada Garibaldi, MD  senna-docusate (SENOKOT-S) 8.6-50 MG tablet Take 1 tablet by mouth 2 (two) times daily. Patient not taking: Reported on 02/18/2024 09/06/22   Lacretia Piccolo, PA-C  thiamine  (VITAMIN B-1) 100 MG tablet Take 100 mg by mouth daily.    [provider]  tiZANidine  (ZANAFLEX ) 2 MG tablet TAKE 1 TABLET(2 MG) BY MOUTH AT BEDTIME Patient not taking: Reported on 02/18/2024 10/15/22   Genetta Kenning, MD  traMADol  (ULTRAM ) 50 MG tablet Take 1 tablet (50 mg total) by mouth every 6 (six) hours as needed for moderate pain. Patient not taking: Reported on 02/18/2024 12/07/22   Leona Rake, MD  Vilazodone  HCl (VIIBRYD ) 10 MG TABS Take 10 mg by mouth daily. 02/10/24   [provider]  Water  For Irrigation, Sterile (FREE WATER ) SOLN Place 100 mLs into feeding tube every 4 (four) hours. Patient not taking: Reported on 02/18/2024 12/07/22   Leona Rake, MD    Physical Exam: Vitals:   03/03/24 1310 03/03/24 1313 03/03/24 1400 03/03/24 1415  BP:   (!) 160/89 (!) 167/87   Pulse: 75  84 86  Resp: 19  16 13   Temp:      TempSrc:      SpO2: 95%  95% 95%  Weight:  71.2 kg    Height:  5\' 6"  (1.676 m)     GENERAL:  A&O x 2, NAD, well developed, cooperative, follows commands HEENT: /AT, No thrush, No icterus, No oral ulcers Neck:  No neck mass, No meningismus, soft, supple CV: RRR, no S3, no S4, no rub, no JVD Lungs: Bibasilar rales.  No wheezing.  Good air movement Abd: soft/NT +BS, nondistended Ext: No edema, no lymphangitis, no cyanosis, no rashes Neuro:  CN II-XII intact, strength 0/5 in RUE, RLE, strength 4/5 LUE, LLE; sensation intact bilateral; no dysmetria; babinski equivocal  Data Reviewed: Data reviewed above in the history  Assessment and Plan: Dysarthria -Low suspicion of new stroke without any new focal findings - Suspect recrudescence of old deficits from a metabolic derangement - Appreciate neurology consultation - MRI brain - CTA head and neck--negative for LVO or perfusion defect; severe stenosis right MCA and right PCA - CT brain negative for acute findings - Check A1c - Check lipid panel - PT/OT - Speech therapy evaluation  Acute metabolic encephalopathy/generalized weakness - Obtain UA - VBG - TSH - B12 - Ammonia - CT brain negative for acute findings - MRI brain - Obtain chest x-ray -hold baclofen /atarax  History of stroke - Patient has residual left hemiparesis - PT/OT evaluation - Continue aspirin   Abdominal pain - CT abdomen and pelvis  Atypical chest pain - Cycle troponins - Personally reviewed EKG--sinus rhythm, nonspecific T wave change  Anxiety/depression - Holding Remeron  - continue vilazodone  and buspirone   Bullous pemphigoid - Continue mycophenolate   Mixed hyperlipidemia -Continue - Continue statin    Advance Care Planning: FULL  Consults: neurology  Family Communication: son updated 4/22  Severity of Illness: The appropriate patient status for this patient is  OBSERVATION.  Observation status is judged to be reasonable and necessary in order to provide the required intensity of service to ensure the patient's safety. The patient's presenting symptoms, physical exam findings, and initial radiographic and laboratory data in the context of their medical condition is felt to place them at decreased risk for further clinical deterioration. Furthermore, it is anticipated that the patient will be medically stable for discharge from the hospital within 2 midnights of admission.   Author: Demaris Fillers, MD 03/03/2024 2:40 PM  For on call review [email or web address].

## 2024-03-03 NOTE — Progress Notes (Signed)
 SLP Cancellation Note  Patient Details Name: Kara Bailey MRN: [id number] DOB: 22-Aug-1956   Cancelled treatment:       Reason Eval/Treat Not Completed: Other (comment) (Consult received for SLE, however Pt appears to have failed the Yale swallow screen and is NPO, but BSE was not ordered. SLP will check back tomorrow to copmplete SLE. If BSE is warrented, please order.)  Thank you,  Claudetta Cuba, CCC-SLP [phone]  Kaire Stary 03/03/2024, 6:21 PM

## 2024-03-03 NOTE — ED Notes (Signed)
 CODE STROKE activated [id number]

## 2024-03-04 ENCOUNTER — Other Ambulatory Visit (HOSPITAL_COMMUNITY): Payer: Self-pay | Admitting: *Deleted

## 2024-03-04 ENCOUNTER — Inpatient Hospital Stay (HOSPITAL_COMMUNITY)
Admit: 2024-03-04 | Discharge: 2024-03-04 | Disposition: A | Discharge status: Home / Self Care | Attending: Internal Medicine

## 2024-03-04 ENCOUNTER — Observation Stay (HOSPITAL_COMMUNITY)

## 2024-03-04 DIAGNOSIS — I69322 Dysarthria following cerebral infarction: Secondary | ICD-10-CM | POA: Diagnosis not present

## 2024-03-04 DIAGNOSIS — Z79899 Other long term (current) drug therapy: Secondary | ICD-10-CM | POA: Diagnosis not present

## 2024-03-04 DIAGNOSIS — K449 Diaphragmatic hernia without obstruction or gangrene: Secondary | ICD-10-CM | POA: Diagnosis present

## 2024-03-04 DIAGNOSIS — I6621 Occlusion and stenosis of right posterior cerebral artery: Secondary | ICD-10-CM | POA: Diagnosis present

## 2024-03-04 DIAGNOSIS — R4701 Aphasia: Secondary | ICD-10-CM | POA: Diagnosis present

## 2024-03-04 DIAGNOSIS — N179 Acute kidney failure, unspecified: Secondary | ICD-10-CM | POA: Diagnosis present

## 2024-03-04 DIAGNOSIS — Z79624 Long term (current) use of inhibitors of nucleotide synthesis: Secondary | ICD-10-CM | POA: Diagnosis not present

## 2024-03-04 DIAGNOSIS — I10 Essential (primary) hypertension: Secondary | ICD-10-CM | POA: Diagnosis present

## 2024-03-04 DIAGNOSIS — R569 Unspecified convulsions: Secondary | ICD-10-CM | POA: Diagnosis not present

## 2024-03-04 DIAGNOSIS — F419 Anxiety disorder, unspecified: Secondary | ICD-10-CM | POA: Diagnosis present

## 2024-03-04 DIAGNOSIS — R471 Dysarthria and anarthria: Secondary | ICD-10-CM | POA: Diagnosis present

## 2024-03-04 DIAGNOSIS — Z7982 Long term (current) use of aspirin: Secondary | ICD-10-CM | POA: Diagnosis not present

## 2024-03-04 DIAGNOSIS — G9341 Metabolic encephalopathy: Secondary | ICD-10-CM | POA: Diagnosis present

## 2024-03-04 DIAGNOSIS — E782 Mixed hyperlipidemia: Secondary | ICD-10-CM | POA: Diagnosis present

## 2024-03-04 DIAGNOSIS — R0789 Other chest pain: Secondary | ICD-10-CM | POA: Diagnosis present

## 2024-03-04 DIAGNOSIS — K7460 Unspecified cirrhosis of liver: Secondary | ICD-10-CM | POA: Diagnosis present

## 2024-03-04 DIAGNOSIS — K219 Gastro-esophageal reflux disease without esophagitis: Secondary | ICD-10-CM | POA: Diagnosis present

## 2024-03-04 DIAGNOSIS — Z7902 Long term (current) use of antithrombotics/antiplatelets: Secondary | ICD-10-CM | POA: Diagnosis not present

## 2024-03-04 DIAGNOSIS — R531 Weakness: Secondary | ICD-10-CM | POA: Diagnosis not present

## 2024-03-04 DIAGNOSIS — L120 Bullous pemphigoid: Secondary | ICD-10-CM | POA: Diagnosis present

## 2024-03-04 DIAGNOSIS — I6601 Occlusion and stenosis of right middle cerebral artery: Secondary | ICD-10-CM | POA: Diagnosis present

## 2024-03-04 DIAGNOSIS — I69391 Dysphagia following cerebral infarction: Secondary | ICD-10-CM | POA: Diagnosis not present

## 2024-03-04 DIAGNOSIS — I739 Peripheral vascular disease, unspecified: Secondary | ICD-10-CM | POA: Diagnosis present

## 2024-03-04 DIAGNOSIS — J439 Emphysema, unspecified: Secondary | ICD-10-CM | POA: Diagnosis present

## 2024-03-04 DIAGNOSIS — E860 Dehydration: Secondary | ICD-10-CM | POA: Diagnosis present

## 2024-03-04 DIAGNOSIS — I69354 Hemiplegia and hemiparesis following cerebral infarction affecting left non-dominant side: Secondary | ICD-10-CM | POA: Diagnosis not present

## 2024-03-04 DIAGNOSIS — R079 Chest pain, unspecified: Secondary | ICD-10-CM | POA: Diagnosis not present

## 2024-03-04 DIAGNOSIS — R4182 Altered mental status, unspecified: Secondary | ICD-10-CM | POA: Diagnosis not present

## 2024-03-04 DIAGNOSIS — F32A Depression, unspecified: Secondary | ICD-10-CM | POA: Diagnosis present

## 2024-03-04 LAB — ECHOCARDIOGRAM COMPLETE
AR max vel: 1.58 cm2
AV Area VTI: 1.6 cm2
AV Area mean vel: 1.85 cm2
AV Mean grad: 9 mmHg
AV Peak grad: 15.5 mmHg
Ao pk vel: 1.97 m/s
Area-P 1/2: 2.03 cm2
Est EF: >75
Height: 66 in
S' Lateral: 2.07 cm
Weight: 2511.48 [oz_av]

## 2024-03-04 LAB — CBC
HCT: 44.1 % (ref 36.0–46.0)
Hemoglobin: 14.1 g/dL (ref 12.0–15.0)
MCH: 30.8 pg (ref 26.0–34.0)
MCHC: 32 g/dL (ref 30.0–36.0)
MCV: 96.3 fL (ref 80.0–100.0)
Platelets: 251 10*3/uL (ref 150–400)
RBC: 4.58 MIL/uL (ref 3.87–5.11)
RDW: 14.9 % (ref 11.5–15.5)
WBC: 10 10*3/uL (ref 4.0–10.5)
nRBC: 0 % (ref 0.0–0.2)

## 2024-03-04 LAB — LIPID PANEL
Cholesterol: 78 mg/dL (ref 0–200)
HDL: 36 mg/dL — ABNORMAL LOW (ref >40)
LDL Cholesterol: 29 mg/dL (ref 0–99)
Total CHOL/HDL Ratio: 2.2 ratio
Triglycerides: 63 mg/dL (ref <150)
VLDL: 13 mg/dL (ref 0–40)

## 2024-03-04 LAB — BASIC METABOLIC PANEL WITH GFR
Anion gap: 10 (ref 5–15)
BUN: 18 mg/dL (ref 8–23)
CO2: 22 mmol/L (ref 22–32)
Calcium: 9.3 mg/dL (ref 8.9–10.3)
Chloride: 110 mmol/L (ref 98–111)
Creatinine, Ser: 1.31 mg/dL — ABNORMAL HIGH (ref 0.44–1.00)
GFR, Estimated: 44 mL/min — ABNORMAL LOW (ref >60)
Glucose, Bld: 73 mg/dL (ref 70–99)
Potassium: 4 mmol/L (ref 3.5–5.1)
Sodium: 142 mmol/L (ref 135–145)

## 2024-03-04 LAB — MAGNESIUM: Magnesium: 2.3 mg/dL (ref 1.7–2.4)

## 2024-03-04 MED ORDER — ASPIRIN 300 MG RE SUPP
300.0000 mg | Freq: Every day | RECTAL | Status: DC
  Administered 2024-03-04 – 2024-03-05 (×2): 300 mg via RECTAL
  Filled 2024-03-04 (×2): qty 1

## 2024-03-04 MED ORDER — PERFLUTREN LIPID MICROSPHERE
1.0000 mL | INTRAVENOUS | Status: AC | PRN
  Administered 2024-03-04: 3 mL via INTRAVENOUS

## 2024-03-04 MED ORDER — LACTATED RINGERS IV SOLN: INTRAVENOUS | Status: DC

## 2024-03-04 NOTE — Plan of Care (Signed)
  Problem: Acute Rehab OT Goals (only OT should resolve) Goal: Pt. Will Perform Upper Body Bathing Flowsheets (Taken 03/04/2024 0937) Pt Will Perform Upper Body Bathing:  with contact guard assist  with min assist Goal: Pt. Will Perform Upper Body Dressing Flowsheets (Taken 03/04/2024 0937) Pt Will Perform Upper Body Dressing: with contact guard assist Goal: Pt. Will Transfer To Toilet Flowsheets (Taken 03/04/2024 [phone]) Pt Will Transfer to Toilet: with min assist Goal: Pt/Caregiver Will Perform Home Exercise Program Flowsheets (Taken 03/04/2024 [phone]) Pt/caregiver will Perform Home Exercise Program:  Increased ROM  Increased strength  Right Upper extremity  Left upper extremity  With minimal assist  Dashanti Burr OT, MOT

## 2024-03-04 NOTE — Congregational Nurse Program (Unsigned)
 Ms. Kara Bailey family called to inform me that she was admitted to the hospital yesterday. They reported that she was experiencing discomfort and had slurred speech. I spoke with the family, who mentioned that Ms. Kara Bailey is currently resting well. I assured them that I would follow up tomorrow.  Kara Huntsman RN MSN DNP [facility]

## 2024-03-04 NOTE — Plan of Care (Signed)
  Problem: Acute Rehab PT Goals(only PT should resolve) Goal: Pt will Roll Supine to Side Outcome: Progressing Flowsheets (Taken 03/04/2024 1135) Pt will Roll Supine to Side: with min assist Goal: Pt Will Go Supine/Side To Sit Outcome: Progressing Flowsheets (Taken 03/04/2024 1135) Pt will go Supine/Side to Sit: with minimal assist Goal: Pt Will Go Sit To Supine/Side Outcome: Progressing Flowsheets (Taken 03/04/2024 1135) Pt will go Sit to Supine/Side: with minimal assist Goal: Patient Will Perform Sitting Balance Outcome: Progressing Flowsheets (Taken 03/04/2024 1135) Patient will perform sitting balance: with minimal assist Goal: Patient Will Transfer Sit To/From Stand Outcome: Progressing Flowsheets (Taken 03/04/2024 1135) Patient will transfer sit to/from stand: with minimal assist Goal: Pt Will Transfer Bed To Chair/Chair To Bed Outcome: Progressing Flowsheets (Taken 03/04/2024 1135) Pt will Transfer Bed to Chair/Chair to Bed: with min assist Goal: Pt Will Perform Standing Balance Or Pre-Gait Outcome: Progressing   11:36 AM, 03/04/24 Walton Guppy, MPT Physical Therapist with [facility] 336 [phone] office [phone] mobile phone

## 2024-03-04 NOTE — Evaluation (Signed)
 Physical Therapy Evaluation Patient Details Name: Kara Bailey MRN: [id number] DOB: 07/19/1956 Today's Date: 03/04/2024  History of Present Illness  Kara Bailey is a 68 year old female with a history of stroke and left hemiparesis, anxiety, depression, hyperlipidemia presenting with speech abnormality.  Apparently, the patient resides at [facility].  She was sent over because of concerns of her speech and possibly her dysarthria.  Attempts were made to contact the facility without success x 3.  In speaking with the patient's son, he stated that he last saw the patient about 2 weeks prior to this admission.  According to son, the patient is wheelchair-bound at baseline, but she is able to feed herself.  He states that she is normally able to speak coherently although sometimes a little bit slow.  She is sometimes slow to process information.  However, he states that she is normally able to state her name, and place and occasionally the date.  In the ED, the patient is awake and alert.  She denies any chest pain, shortness of breath, vomiting, diarrhea.  She does complain of some abdominal pain.  She states that has been going on for about 3 days.  She denies any dysuria.  She denies any headache or neck pain.  In the the patient is afebrile hemodynamically stable.   Clinical Impression  Patient demonstrates slow labored movement for sitting up at bedside, once seated able to maintain sitting balance, fair carryover for scooting laterally, able to use RUE/LE during sit to stand and stand pivot transfers to chair with left knee blocked. Patient tolerated sitting up in chair after therapy - nursing staff aware. Patient will benefit from continued skilled physical therapy in hospital and recommended venue below to increase strength, balance, endurance for safe ADLs and gait.           If plan is discharge home, recommend the following: A lot of help with bathing/dressing/bathroom;A lot of help  with walking and/or transfers;Help with stairs or ramp for entrance;Assistance with cooking/housework   Can travel by private vehicle   No    Equipment Recommendations None recommended by PT  Recommendations for Other Services       Functional Status Assessment Patient has had a recent decline in their functional status and demonstrates the ability to make significant improvements in function in a reasonable and predictable amount of time.     Precautions / Restrictions Precautions Precautions: Fall Required Braces or Orthoses: Other Brace Other Brace: left AFO (not in hospital) Restrictions Weight Bearing Restrictions Per Provider Order: No      Mobility  Bed Mobility Overal bed mobility: Needs Assistance Bed Mobility: Supine to Sit     Supine to sit: Min assist, HOB elevated     General bed mobility comments: increased time, labored movement    Transfers Overall transfer level: Needs assistance Equipment used: 1 person hand held assist, None Transfers: Sit to/from Stand Sit to Stand: Mod assist          Lateral/Scoot Transfers: Mod assist General transfer comment: fair/good return for using RUE to push on armrest of chair during stand pivot transfers with left knee blocked    Ambulation/Gait                  Stairs            Wheelchair Mobility     Tilt Bed    Modified Rankin (Stroke Patients Only)       Balance Overall balance assessment:  Needs assistance Sitting-balance support: Feet supported, No upper extremity supported Sitting balance-Leahy Scale: Fair Sitting balance - Comments: seated at EOB   Standing balance support: During functional activity Standing balance-Leahy Scale: Poor Standing balance comment: hand held assist                             Pertinent Vitals/Pain Pain Assessment Pain Assessment: No/denies pain    Home Living Family/patient expects to be discharged to:: Skilled nursing facility                         Prior Function Prior Level of Function : Needs assist       Physical Assist : ADLs (physical);Mobility (physical) Mobility (physical): Transfers;Bed mobility;Gait;Stairs ADLs (physical): Bathing;Dressing;Toileting;IADLs;Grooming Mobility Comments: Pt assited for transfers at facility. ADLs Comments: Assit by SNF staff. Pt can reportedly feed her self per chart review and pt seeming to confirm that.     Extremity/Trunk Assessment   Upper Extremity Assessment Upper Extremity Assessment: Defer to OT evaluation RUE Deficits / Details: General weakness. Good funcitonal use. RUE Coordination: WNL LUE Deficits / Details: Severely limited from baseline stroke. Moderate tone throughout. Limited P/ROM. LUE Coordination: decreased fine motor;decreased gross motor    Lower Extremity Assessment Lower Extremity Assessment: Generalized weakness;LLE deficits/detail LLE Deficits / Details: grossly 0-1/5 baseline weakness due to old CVA LLE Sensation: decreased proprioception LLE Coordination: decreased gross motor    Cervical / Trunk Assessment Cervical / Trunk Assessment: Normal  Communication   Communication Communication: Impaired Factors Affecting Communication: Difficulty expressing self    Cognition Arousal: Alert Behavior During Therapy: WFL for tasks assessed/performed   PT - Cognitive impairments: No apparent impairments                         Following commands: Impaired Following commands impaired: Follows one step commands with increased time     Cueing Cueing Techniques: Verbal cues, Tactile cues     General Comments      Exercises     Assessment/Plan    PT Assessment Patient needs continued PT services  PT Problem List Decreased strength;Decreased activity tolerance;Decreased balance;Decreased mobility       PT Treatment Interventions DME instruction;Functional mobility training;Therapeutic activities;Therapeutic  exercise;Balance training;Patient/family education;Wheelchair mobility training    PT Goals (Current goals can be found in the Care Plan section)  Acute Rehab PT Goals Patient Stated Goal: return home PT Goal Formulation: With patient Time For Goal Achievement: 03/18/24 Potential to Achieve Goals: Good    Frequency Min 2X/week     Co-evaluation PT/OT/SLP Co-Evaluation/Treatment: Yes Reason for Co-Treatment: To address functional/ADL transfers PT goals addressed during session: Mobility/safety with mobility;Balance;Proper use of DME OT goals addressed during session: ADL's and self-care       AM-PAC PT "6 Clicks" Mobility  Outcome Measure Help needed turning from your back to your side while in a flat bed without using bedrails?: A Little Help needed moving from lying on your back to sitting on the side of a flat bed without using bedrails?: A Lot Help needed moving to and from a bed to a chair (including a wheelchair)?: A Lot Help needed standing up from a chair using your arms (e.g., wheelchair or bedside chair)?: A Little Help needed to walk in hospital room?: Total Help needed climbing 3-5 steps with a railing? : Total 6 Click Score: 12    End  of Session   Activity Tolerance: Patient tolerated treatment well;Patient limited by fatigue Patient left: in chair;with call bell/phone within reach Nurse Communication: Mobility status PT Visit Diagnosis: Unsteadiness on feet (R26.81);Other abnormalities of gait and mobility (R26.89);Muscle weakness (generalized) (M62.81)    Time: 1610-9604 PT Time Calculation (min) (ACUTE ONLY): 23 min   Charges:   PT Evaluation $PT Eval Moderate Complexity: 1 Mod PT Treatments $Therapeutic Activity: 23-37 mins PT General Charges $$ ACUTE PT VISIT: 1 Visit         11:34 AM, 03/04/24 Walton Guppy, MPT Physical Therapist with [facility] [phone] office [phone] mobile phone

## 2024-03-04 NOTE — Progress Notes (Signed)
Routine EEG complete. Results pending.

## 2024-03-04 NOTE — Progress Notes (Signed)
*  PRELIMINARY RESULTS* Echocardiogram 2D Echocardiogram has been performed with Definity .  Bernis Brisker 03/04/2024, 2:39 PM

## 2024-03-04 NOTE — Evaluation (Signed)
 Clinical/Bedside Swallow Evaluation Patient Details  Name: Kara Bailey MRN: [id number] Date of Birth: 1956/05/04  Today's Date: 03/04/2024 Time: SLP Start Time (ACUTE ONLY): 1411 SLP Stop Time (ACUTE ONLY): 1440 SLP Time Calculation (min) (ACUTE ONLY): 29 min  Past Medical History:  Past Medical History:  Diagnosis Date   Acute CVA (cerebrovascular accident) (HCC) 12/29/2020   Anxiety    Chest pain 08/2014   unspecified   Cirrhosis (HCC)    CVA (cerebral vascular accident) (HCC) 07/27/2022   Depression    Dizziness 08/2014   Dysphagia following cerebral infarction    Dyspnea 08/2014   Dysrhythmia    Emphysema lung (HCC)    GERD (gastroesophageal reflux disease)    Heart murmur    Hemiplegia, unspecified affecting left nondominant side (HCC)    Hepatitis C    Hiatal hernia    Hyperlipidemia    Hypertension    Insomnia    Ischemic stroke (HCC) 08/01/2022   Muscle weakness (generalized)    Other muscle spasm    PAC (premature atrial contraction) 10/11/2014   PAD (peripheral artery disease) (HCC)    Palpitations    PVC (premature ventricular contraction) 10/11/2014   PVD (peripheral vascular disease) (HCC)    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   Stroke (HCC)    just slurred speech   Tachycardia 08/2014   Ventral hernia    Weakness 08/2014   Past Surgical History:  Past Surgical History:  Procedure Laterality Date   ABDOMINAL AORTOGRAM W/LOWER EXTREMITY N/A 07/13/2021   Procedure: ABDOMINAL AORTOGRAM W/LOWER EXTREMITY;  Surgeon: Avanell Leigh, MD;  Location: [facility];  Service: Cardiovascular;  Laterality: N/A;   ABDOMINAL AORTOGRAM W/LOWER EXTREMITY N/A 03/19/2022   Procedure: ABDOMINAL AORTOGRAM W/LOWER EXTREMITY;  Surgeon: Avanell Leigh, MD;  Location: [facility];  Service: Cardiovascular;  Laterality: N/A;   APPENDECTOMY     CATARACT EXTRACTION, BILATERAL     COLONOSCOPY     COLONOSCOPY WITH PROPOFOL  N/A 04/30/2016    Procedure: COLONOSCOPY WITH PROPOFOL ;  Surgeon: Suzette Espy, MD;  Location: [facility];  Service: Endoscopy;  Laterality: N/A;  1230   EPIGASTRIC HERNIA REPAIR N/A 10/13/2021   Procedure: OPEN REPAIR EPIGASTRIC HERNIA WITH MESH PATCH;  Surgeon: Oralee Billow, MD;  Location: [facility];  Service: General;  Laterality: N/A;   ESOPHAGOGASTRODUODENOSCOPY     approximately 2010   ESOPHAGOGASTRODUODENOSCOPY (EGD) WITH PROPOFOL  N/A 04/30/2016   Procedure: ESOPHAGOGASTRODUODENOSCOPY (EGD) WITH PROPOFOL ;  Surgeon: Suzette Espy, MD;  Location: [facility];  Service: Endoscopy;  Laterality: N/A;   ESOPHAGOGASTRODUODENOSCOPY (EGD) WITH PROPOFOL  N/A 12/28/2022   Procedure: ESOPHAGOGASTRODUODENOSCOPY (EGD) WITH PROPOFOL ;  Surgeon: Kenney Peacemaker, MD;  Location: [facility];  Service: Gastroenterology;  Laterality: N/A;   IR GASTROSTOMY TUBE MOD SED  12/03/2022   IR GASTROSTOMY TUBE REMOVAL  09/12/2023   LEFT HEART CATHETERIZATION WITH CORONARY ANGIOGRAM N/A 10/29/2014   07-14-20- pt denies this Procedure: LEFT HEART CATHETERIZATION WITH CORONARY ANGIOGRAM;  Surgeon: Odie Benne, MD;  Location: [facility] CATH LAB;  Service: Cardiovascular;  Laterality: N/A;   MALONEY DILATION N/A 04/30/2016   Procedure: Londa Rival DILATION;  Surgeon: Suzette Espy, MD;  Location: [facility];  Service: Endoscopy;  Laterality: N/A;   PERIPHERAL VASCULAR INTERVENTION  07/13/2021   Procedure: PERIPHERAL VASCULAR INTERVENTION;  Surgeon: Avanell Leigh, MD;  Location: [facility];  Service: Cardiovascular;;  left SFA left external iliac   PERIPHERAL VASCULAR INTERVENTION Right 03/19/2022  Procedure: PERIPHERAL VASCULAR INTERVENTION;  Surgeon: Avanell Leigh, MD;  Location: [facility] INVASIVE CV LAB;  Service: Cardiovascular;  Laterality: Right;  SFA   POLYPECTOMY  04/30/2016   Procedure: POLYPECTOMY;  Surgeon: Suzette Espy, MD;  Location: [facility];  Service: Endoscopy;;  Sigmoid colon polyp removed via hot snare    UPPER GASTROINTESTINAL ENDOSCOPY     HPI:  Kara Bailey is a 68 year old female with a history of stroke and left hemiparesis, anxiety, depression, hyperlipidemia presenting with speech abnormality.  Apparently, the patient resides at [facility].  She was sent over because of concerns of her speech and possibly her dysarthria.  Attempts were made to contact the facility without success x 3.  In speaking with the patient's son, he stated that he last saw the patient about 2 weeks prior to this admission.  According to son, the patient is wheelchair-bound at baseline, but she is able to feed herself.  He states that she is normally able to speak coherently although sometimes a little bit slow.  She is sometimes slow to process information.  However, he states that she is normally able to state her name, and place and occasionally the date. MRI reveals:  "No evidence of an acute intracranial abnormality." Pt with history of ST for dysarthria and dysphagia. She has been on a regular/thin diet since her last stroke in 2023 per Pt and family.    Assessment / Plan / Recommendation  Clinical Impression  Clinical swallowing evaluation completed while Pt was sitting upright in bed; Oral care completed - note white lingual coating, unable to cleanse with toothettes, question thrush. Pt consumed thin liquids via straw taking VERY small sips; she reports this is baseline and she knows she needs to take small sips or she gets "strangled". When sipping from the cup note anterior spillage and poor oral control. Pt consumed puree and solid textures without difficulty; note some prolonged mastication and poor labial closure with solid textures. Recommend initiate D2/fine chopped diet and thin liquids (STRAW RECOMMENDED). ST will follow tomorrow for possible upgrade to D3/mech soft which patient would prefer. Thank you for this referral, SLP Visit Diagnosis: Dysphagia, unspecified (R13.10)    Aspiration Risk  Mild  aspiration risk;Moderate aspiration risk    Diet Recommendation Dysphagia 2 (Fine chop);Thin liquid    Liquid Administration via: Straw Medication Administration: Crushed with puree Supervision: Patient able to self feed Compensations: Minimize environmental distractions;Slow rate;Small sips/bites Postural Changes: Seated upright at 90 degrees    Other  Recommendations Oral Care Recommendations: Oral care BID    Recommendations for follow up therapy are one component of a multi-disciplinary discharge planning process, led by the attending physician.  Recommendations may be updated based on patient status, additional functional criteria and insurance authorization.           Frequency and Duration min 1 x/week  1 week       Prognosis Prognosis for improved oropharyngeal function: Good Barriers to Reach Goals: Cognitive deficits      Swallow Study   General Date of Onset: 03/03/24 HPI: Kara Bailey is a 68 year old female with a history of stroke and left hemiparesis, anxiety, depression, hyperlipidemia presenting with speech abnormality.  Apparently, the patient resides at [facility].  She was sent over because of concerns of her speech and possibly her dysarthria.  Attempts were made to contact the facility without success x 3.  In speaking with the patient's son, he stated that he last saw  the patient about 2 weeks prior to this admission.  According to son, the patient is wheelchair-bound at baseline, but she is able to feed herself.  He states that she is normally able to speak coherently although sometimes a little bit slow.  She is sometimes slow to process information.  However, he states that she is normally able to state her name, and place and occasionally the date. MRI reveals:  "No evidence of an acute intracranial abnormality." Pt with history of ST for dysarthria and dysphagia. She has been on a regular/thin diet since her last stroke in 2023 per Pt and family. Type  of Study: Bedside Swallow Evaluation Previous Swallow Assessment: MBSS completed 07/2022 Diet Prior to this Study: NPO Temperature Spikes Noted: No Respiratory Status: Room air History of Recent Intubation: No Behavior/Cognition: Alert;Cooperative;Pleasant mood Oral Cavity Assessment: Within Functional Limits Oral Care Completed by SLP: Yes Oral Cavity - Dentition: Dentures, top (no lower teeth or dentures) Vision: Functional for self-feeding Self-Feeding Abilities: Able to feed self Patient Positioning: Upright in bed Baseline Vocal Quality: Breathy;Low vocal intensity Volitional Swallow: Able to elicit    Oral/Motor/Sensory Function Overall Oral Motor/Sensory Function: Within functional limits   Ice Chips Ice chips: Within functional limits   Thin Liquid Thin Liquid: Within functional limits    Nectar Thick Nectar Thick Liquid: Not tested   Honey Thick Honey Thick Liquid: Not tested   Puree Puree: Within functional limits   Solid     Solid: Within functional limits     Kara Bailey, CCC-SLP Speech Language Pathologist  Florina Husbands 03/04/2024,3:36 PM

## 2024-03-04 NOTE — Progress Notes (Signed)
 PROGRESS NOTE  Kara Bailey RUE:[id number] DOB: 1956-09-27 DOA: 03/03/2024 PCP: Forestine Igo, DO  Brief History:  68 year old female with a history of stroke and left hemiparesis, anxiety, depression, hyperlipidemia presenting with speech abnormality.  Apparently, the patient resides at [facility].  She was sent over because of concerns of her speech and possibly her dysarthria.  Attempts were made to contact the facility without success x 3.  In speaking with the patient's son, he stated that he last saw the patient about 2 weeks prior to this admission.  According to son, the patient is wheelchair-bound at baseline, but she is able to feed herself.  He states that she is normally able to speak coherently although sometimes a little bit slow.  She is sometimes slow to process information.  However, he states that she is normally able to state her name, and place and occasionally the date.  In the ED, the patient is awake and alert.  She denies any chest pain, shortness of breath, vomiting, diarrhea.  She does complain of some abdominal pain.  She states that has been going on for about 3 days.  She denies any dysuria.  She denies any headache or neck pain. In the the patient is afebrile hemodynamically stable. WBC 10.9, hemoglobin 14.6, platelets 265.  Sodium 145, potassium 4.2, bicarbonate 26, serum creatinine 1.29.  LFTs were unremarkable.  Troponin 9.  EKG shows sinus rhythm with nonspecific T wave changes.  CT of the brain was negative for any acute findings.  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  The patient was seen by neurology who recommended admission for further workup.  They did not feel the patient qualified for thrombolytics.   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 - CT brain negative for acute findings - 03/03/24 A1c--5.5 - Check lipid panel--LDL 29 - PT/OT - Speech therapy evaluation>>dys 2 with thin   Acute metabolic encephalopathy/generalized weakness - due to AKI and dehydration - improving with IVF - Obtain UA--no pyuria - VBG-- 7.41/47/<31/29 - TSH--0.326 - B12--827 - Ammonia--13 - CT brain negative for acute findings - MRI brain--neg for acute findings - CT chest/abd--no acute findings -hold baclofen /atarax   History of stroke - Patient has residual left hemiparesis - PT/OT evaluation - Continue aspirin  and plavix    Abdominal pain - CT abdomen and pelvis--no acute findings - now tolerating diet   Atypical chest pain - Cycle troponins neg - Personally reviewed EKG--sinus rhythm, nonspecific T wave change   Anxiety/depression - Holding Remeron  - continue vilazodone  and buspirone    Bullous pemphigoid - Continue mycophenolate    Mixed hyperlipidemia -Continue - Continue statin       Family Communication:   son at bedside 4/23  Consultants:  none  Code Status:  FULL   DVT Prophylaxis:  Ivey Heparin     Procedures: As Listed in Progress Note Above  Antibiotics: None       Subjective: Patient denies fevers, chills, headache, chest pain, dyspnea, nausea, vomiting, diarrhea, abdominal pain,   Objective: Vitals:   03/04/24 0430 03/04/24 0744 03/04/24 0802 03/04/24 1205  BP: 134/67 98/60 (!) 153/73 (!) 142/79  Pulse: 91 (!) 29 88 (!) 106  Resp:  18  18  Temp: 98.5 F (36.9 C) 98.2 F (36.8 C) 98.2 F (36.8 C) 98.7 F (37.1 C)  TempSrc: Axillary Oral Oral Oral  SpO2: 95% 97% 96% 97%  Weight:      Height:        Intake/Output Summary (Last 24 hours) at 03/04/2024 1312 Last data filed at 03/04/2024 0515 Gross per 24 hour  Intake 822.12 ml  Output 525  ml  Net 297.12 ml   Weight change:  Exam:  General:  Pt is alert, follows commands appropriately, not in acute distress HEENT: No icterus, No thrush, No neck mass, [place]/AT Cardiovascular: RRR, S1/S2, no rubs, no gallops Respiratory: CTA bilaterally, no wheezing, no crackles, no rhonchi Abdomen: Soft/+BS, non tender, non distended, no guarding Extremities: No edema, No lymphangitis, No petechiae, No rashes, no synovitis   Data Reviewed: I have personally reviewed following labs and imaging studies Basic Metabolic Panel: Recent Labs  Lab 03/03/24 1245 03/03/24 1259 03/04/24 0440  NA 145 145 142  K 4.2 4.2 4.0  CL 109 111 110  CO2 26  --  22  GLUCOSE 85 81 73  BUN 21 20 18   CREATININE 1.29* 1.50* 1.31*  CALCIUM  9.6  --  9.3  MG  --   --  2.3   Liver Function Tests: Recent Labs  Lab 03/03/24 1245  AST 21  ALT 27  ALKPHOS 100  BILITOT 0.7  PROT 6.8  ALBUMIN 3.7   No results for input(s): "LIPASE", "AMYLASE" in the last 168 hours. Recent Labs  Lab 03/03/24 1601  AMMONIA 13   Coagulation Profile: Recent Labs  Lab 03/03/24 1245  INR 1.1   CBC: Recent Labs  Lab 03/03/24 1245 03/03/24 1259 03/04/24 0440  WBC 10.9*  --  10.0  NEUTROABS 5.9  --   --   HGB 14.6 15.3* 14.1  HCT 45.9 45.0 44.1  MCV 96.6  --  96.3  PLT 265  --  251   Cardiac Enzymes: Recent Labs  Lab 03/03/24 1601  CKTOTAL 44   BNP: Invalid input(s): "POCBNP" CBG: No results for input(s): "GLUCAP" in the last 168 hours. HbA1C: Recent Labs    03/03/24 1601  HGBA1C 5.5   Urine analysis:    Component Value Date/Time   COLORURINE YELLOW 03/03/2024 1758   APPEARANCEUR CLEAR 03/03/2024 1758   LABSPEC 1.045 (H) 03/03/2024 1758   PHURINE 7.0 03/03/2024 1758   GLUCOSEU NEGATIVE 03/03/2024 1758   HGBUR NEGATIVE 03/03/2024 1758   BILIRUBINUR NEGATIVE 03/03/2024 1758   BILIRUBINUR small 06/15/2014 1038   KETONESUR NEGATIVE 03/03/2024 1758   PROTEINUR NEGATIVE 03/03/2024 1758    UROBILINOGEN 0.2 06/15/2014 1038   UROBILINOGEN 1.0 05/04/2013 1637   NITRITE NEGATIVE 03/03/2024 1758   LEUKOCYTESUR NEGATIVE 03/03/2024 1758   Sepsis Labs: @LABRCNTIP (procalcitonin:4,lacticidven:4) ) Recent Results (from the past 240 hours)  Resp panel by RT-PCR (RSV, Flu A&B, Covid) Anterior Nasal Swab     Status: None   Collection Time: 03/03/24  3:45 PM   Specimen: Anterior Nasal Swab  Result Value Ref Range Status   SARS Coronavirus 2 by RT PCR NEGATIVE NEGATIVE Final    Comment: (NOTE) SARS-CoV-2 target nucleic acids are NOT DETECTED.  The SARS-CoV-2 RNA is generally detectable in upper respiratory specimens during the acute phase of infection. The lowest concentration of SARS-CoV-2 viral copies this assay can detect is 138 copies/mL. A negative result does not preclude SARS-Cov-2 infection and should  not be used as the sole basis for treatment or other patient management decisions. A negative result may occur with  improper specimen collection/handling, submission of specimen other than nasopharyngeal swab, presence of viral mutation(s) within the areas targeted by this assay, and inadequate number of viral copies(<138 copies/mL). A negative result must be combined with clinical observations, patient history, and epidemiological information. The expected result is Negative.  Fact Sheet for Patients:  BloggerCourse.com  Fact Sheet for Healthcare Providers:  SeriousBroker.it  This test is no t yet approved or cleared by the [place]  FDA and  has been authorized for detection and/or diagnosis of SARS-CoV-2 by FDA under an Emergency Use Authorization (EUA). This EUA will remain  in effect (meaning this test can be used) for the duration of the COVID-19 declaration under Section 564(b)(1) of the Act, 21 U.S.C.section 360bbb-3(b)(1), unless the authorization is terminated  or revoked sooner.       Influenza A by PCR  NEGATIVE NEGATIVE Final   Influenza B by PCR NEGATIVE NEGATIVE Final    Comment: (NOTE) The Xpert Xpress SARS-CoV-2/FLU/RSV plus assay is intended as an aid in the diagnosis of influenza from Nasopharyngeal swab specimens and should not be used as a sole basis for treatment. Nasal washings and aspirates are unacceptable for Xpert Xpress SARS-CoV-2/FLU/RSV testing.  Fact Sheet for Patients: BloggerCourse.com  Fact Sheet for Healthcare Providers: SeriousBroker.it  This test is not yet approved or cleared by the [place]  FDA and has been authorized for detection and/or diagnosis of SARS-CoV-2 by FDA under an Emergency Use Authorization (EUA). This EUA will remain in effect (meaning this test can be used) for the duration of the COVID-19 declaration under Section 564(b)(1) of the Act, 21 U.S.C. section 360bbb-3(b)(1), unless the authorization is terminated or revoked.     Resp Syncytial Virus by PCR NEGATIVE NEGATIVE Final    Comment: (NOTE) Fact Sheet for Patients: BloggerCourse.com  Fact Sheet for Healthcare Providers: SeriousBroker.it  This test is not yet approved or cleared by the [place]  FDA and has been authorized for detection and/or diagnosis of SARS-CoV-2 by FDA under an Emergency Use Authorization (EUA). This EUA will remain in effect (meaning this test can be used) for the duration of the COVID-19 declaration under Section 564(b)(1) of the Act, 21 U.S.C. section 360bbb-3(b)(1), unless the authorization is terminated or revoked.  Performed at [facility], [address] [place], [place] [address]      Scheduled Meds:   stroke: early stages of recovery book   Does not apply Once   aspirin   300 mg Rectal Daily   atorvastatin   80 mg Oral Daily   busPIRone   5 mg Oral TID   clopidogrel   75 mg Oral Daily   ezetimibe   10 mg Oral Daily   heparin   5,000 Units  Subcutaneous Q8H   mycophenolate   500 mg Oral BID   pantoprazole   40 mg Oral Daily   sodium chloride  flush  3-10 mL Intravenous Q12H   thiamine   100 mg Oral Daily   Vilazodone  HCl  10 mg Oral Daily   Continuous Infusions:  lactated ringers      lactated ringers  75 mL/hr at 03/04/24 1914    Procedures/Studies: MR Brain Wo Contrast (neuro protocol) Result Date: 03/03/2024 CLINICAL DATA:  Provided history: Neuro deficit, acute, stroke suspected. EXAM: MRI HEAD WITHOUT CONTRAST TECHNIQUE: Multiplanar, multiecho pulse sequences of the brain and surrounding structures were obtained without intravenous contrast. COMPARISON:  Non-contrast head CT and CT angiogram head/neck performed  earlier today 03/03/2024. Brain MRI 12/01/2022. FINDINGS: Brain: Mild-to-moderate generalized cerebral atrophy. Mild cerebellar atrophy. Chronic perforator infarct within the right corona radiata/basal ganglia. Chronic hemosiderin deposition at this site. Chronic lacunar infarcts elsewhere within/about the bilateral deep gray nuclei. Background multifocal T2 FLAIR hyperintense signal abnormality within the cerebral white matter, basal ganglia and pons compatible with chronic small vessel ischemic disease. Moderate-sized chronic infarct (with associated chronic blood products) in the superior right cerebellar hemisphere. There is no acute infarct. No evidence of an intracranial mass. No extra-axial fluid collection. No midline shift. Vascular: Maintained flow voids within the proximal large arterial vessels. Skull and upper cervical spine: No focal worrisome marrow lesion. Incompletely assessed cervical spondylosis. Sinuses/Orbits: No mass or acute finding within the imaged orbits. Prior bilateral ocular lens replacement. No significant paranasal sinus disease. IMPRESSION: 1.  No evidence of an acute intracranial abnormality. 2. Parenchymal atrophy, chronic small vessel disease and unchanged chronic infarcts, as described within the  body of the report. Electronically Signed   By: Bascom Lily D.O.   On: 03/03/2024 17:08   CT CHEST ABDOMEN PELVIS WO CONTRAST Result Date: 03/03/2024 CLINICAL DATA:  Abdominal pain, acute, nonlocalized EXAM: CT CHEST, ABDOMEN AND PELVIS WITHOUT CONTRAST TECHNIQUE: Multidetector CT imaging of the chest, abdomen and pelvis was performed following the standard protocol without IV contrast. RADIATION DOSE REDUCTION: This exam was performed according to the departmental dose-optimization program which includes automated exposure control, adjustment of the mA and/or kV according to patient size and/or use of iterative reconstruction technique. COMPARISON:  February 05, 2023 FINDINGS: Of note, the lack of intravenous contrast limits evaluation of the solid organ parenchyma and vascularity. CT CHEST FINDINGS Cardiovascular: No cardiomegaly or pericardial effusion.No aortic aneurysm. Mediastinum/Nodes: No mediastinal mass. No mediastinal, hilar, or axillary lymphadenopathy. Lungs/Pleura: The midline trachea and bronchi are patent. Biapical pleuroparenchymal scarring. No focal airspace consolidation, pleural effusion, or pneumothorax. Posterior bibasilar dependent atelectasis. CT ABDOMEN PELVIS FINDINGS Hepatobiliary: No mass.No radiopaque stones or wall thickening of the gallbladder.No intrahepatic or extrahepatic biliary ductal dilation. Pancreas: No mass or main ductal dilation.No peripancreatic inflammation or fluid collection. Spleen: Normal size. No mass. Adrenals/Urinary Tract: No adrenal masses. No renal mass. No hydronephrosis or nephrolithiasis. The urinary bladder is distended without focal abnormality. Stomach/Bowel: The stomach is decompressed without focal abnormality. No small bowel wall thickening or inflammation. No small bowel obstruction.The appendix was not visualized. No right lower quadrant or pericecal inflammatory changes to suggest acute appendicitis. Scattered colonic diverticulosis.  Vascular/Lymphatic: No aortic aneurysm. Diffuse aortoiliac atherosclerosis. No intraabdominal or pelvic lymphadenopathy. Reproductive: Age-related atrophy of the uterus and ovaries. No concerning adnexal mass. No free pelvic fluid. Other: No pneumoperitoneum, ascites, or mesenteric inflammation. Musculoskeletal: No acute fracture or destructive lesion. Osteopenia. IMPRESSION: 1. No acute abnormality within the chest, abdomen, or pelvis. 2. Scattered colonic diverticulosis. No changes of acute diverticulitis. Electronically Signed   By: Rance Burrows M.D.   On: 03/03/2024 17:08   CT Angio Head Neck W WO CM (CODE STROKE) Result Date: 03/03/2024 CLINICAL DATA:  Provided history: Neuro deficit, acute, stroke suspected. Abnormal speech. Increased weakness. EXAM: CT ANGIOGRAPHY HEAD AND NECK WITH AND WITHOUT CONTRAST TECHNIQUE: Multidetector CT imaging of the head and neck was performed using the standard protocol during bolus administration of intravenous contrast. Multiplanar CT image reconstructions and MIPs were obtained to evaluate the vascular anatomy. Carotid stenosis measurements (when applicable) are obtained utilizing NASCET criteria, using the distal internal carotid diameter as the denominator. RADIATION DOSE REDUCTION: This exam was performed  according to the departmental dose-optimization program which includes automated exposure control, adjustment of the mA and/or kV according to patient size and/or use of iterative reconstruction technique. CONTRAST:  75mL OMNIPAQUE  IOHEXOL  350 MG/ML SOLN COMPARISON:  Non-contrast head CT performed earlier today 03/03/2024. CT angiogram head/neck 07/26/2022. FINDINGS: CTA NECK FINDINGS Aortic arch: The visualized thoracic aorta is normal in caliber. Atherosclerotic plaque within the proximal major branch vessels of the neck. The right subclavian artery origin is excluded from the field of view. Within this limitation, no hemodynamically significant innominate or  proximal subclavian artery stenosis is identified. Right carotid system: CCA and ICA patent within the neck. Atherosclerotic plaque within the mid CCA with up to 50% stenosis relative to the normal caliber CCA, progressed from the prior CTA. Soft plaque within the proximal ICA without hemodynamically significant stenosis (50% or greater). Left carotid system: The common carotid artery origin is incompletely included in the field of view. Within this limitation, the CCA and ICA are patent within the neck without measurable stenosis. Mild atherosclerotic plaque within the proximal ICA. Ulcerated plaque within the carotid bulb (for instance as seen on series 8, image 128). Vertebral arteries: Patent within the neck without stenosis or significant atherosclerotic disease. The right vertebral artery is dominant. Skeleton: The patient is edentulous. Cervical spondylosis. Developmental C2-C3 non segmentation. Cervical spondylosis. No acute fracture or aggressive osseous lesion. Other neck: Multiple thyroid  nodules, the largest within the right lobe measuring 16 mm. Upper chest: No consolidation within the imaged lung apices. Review of the MIP images confirms the above findings CTA HEAD FINDINGS Anterior circulation: The intracranial internal carotid arteries are patent. Nonstenotic atherosclerotic plaque within both vessels. Severe stenosis within the right middle cerebral artery distal M1 segment, progressed from the prior CTA of 07/26/2022 (series 9, image 22) (series 10, image 18). No M2 proximal branch occlusion or high-grade proximal stenosis. The anterior cerebral arteries are patent. Mildly hypoplastic left A1 segment. No intracranial aneurysm is identified. Posterior circulation: The intracranial vertebral arteries are patent. The basilar artery is patent. The posterior cerebral arteries are patent. Atherosclerotic irregularity of both vessels. Most notably, there is a severe stenosis within the right PCA at the  P3/P4 junction (series 11, image 13) and there are sites of moderate stenosis and there is up to moderate stenosis within the left PCA at the P3/P4 junction. Venous sinuses: Within the limitations of contrast timing, no convincing thrombus. Anatomic variants: As described. Review of the MIP images confirms the above findings No emergent large vessel occlusion identified. These results were communicated to Dr. Janett Medin at 2:16 pmon 4/22/2025by text page via the [facility] messaging system. IMPRESSION: CTA neck: 1. The common carotid and internal carotid arteries are patent within the neck without hemodynamically significant stenosis (50% or greater). Atherosclerotic plaque bilaterally, as described. Ulcerated plaque again demonstrated within the left carotid bulb. 2. The vertebral arteries are patent within the neck without stenosis or significant atherosclerotic disease. 3. Multiple thyroid  nodules, the largest within the right lobe measuring 16 mm. A non-emergent thyroid  ultrasound is recommended for further evaluation. Reference: J Am Coll Radiol. 2015 Feb;12(2): 143-50. CTYA head: 1. No proximal intracranial large vessel occlusion identified. 2. Intracranial atherosclerotic disease with multifocal stenoses, most notably as follows. 3. Severe stenosis within the right middle cerebral artery distal M1 segment, progressed from the prior CTA of 07/26/2022. 4. Severe stenosis of the right posterior cerebral artery at the P3/P4 junction. 5. Up to moderate stenosis of the left posterior cerebral artery at the P3/P4  junction. Electronically Signed   By: Bascom Lily D.O.   On: 03/03/2024 14:17   CT HEAD CODE STROKE WO CONTRAST Result Date: 03/03/2024 CLINICAL DATA:  Code stroke. Neuro deficit, acute, stroke suspected. Abnormal speech. Increased weakness. EXAM: CT HEAD WITHOUT CONTRAST TECHNIQUE: Contiguous axial images were obtained from the base of the skull through the vertex without intravenous contrast. RADIATION DOSE  REDUCTION: This exam was performed according to the departmental dose-optimization program which includes automated exposure control, adjustment of the mA and/or kV according to patient size and/or use of iterative reconstruction technique. COMPARISON:  Brain MRI 12/01/2022. FINDINGS: Brain: Generalized cerebral and cerebellar atrophy. 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 Background mild patchy and ill-defined hypoattenuation within the cerebral white matter, nonspecific but compatible chronic small vessel ischemic disease. There is no acute intracranial hemorrhage. No demarcated cortical infarct. No extra-axial fluid collection. No evidence of an intracranial mass. No midline shift. Vascular: No hyperdense vessel.  Atherosclerotic calcifications. Skull: No calvarial fracture or aggressive osseous lesion. Sinuses/Orbits: No mass or acute finding within the imaged orbits. No significant paranasal sinus disease at the imaged levels. ASPECTS [facility] Stroke Program Early CT Score) - Ganglionic level infarction (caudate, lentiform nuclei, internal capsule, insula, M1-M3 cortex): 7 - Supraganglionic infarction (M4-M6 cortex): 3 Total score (0-10 with 10 being normal): 10 (when discounting the chronic infarcts described above). No evidence of an acute intracranial abnormality. These results were communicated to Dr. Janett Medin at 1:00 pmon 4/22/2025by text page via the [facility] messaging system. IMPRESSION: 1.  No evidence of an acute intracranial abnormality. 2. Parenchymal atrophy, chronic small vessel ischemic disease and chronic infarcts, as described. Electronically Signed   By: Bascom Lily D.O.   On: 03/03/2024 13:01    Demaris Fillers, DO  [facility]  If 7PM-7AM, please contact night-coverage [email or web address] Password [email or web address] 03/04/2024, 1:12 PM   LOS: 0 days

## 2024-03-04 NOTE — TOC Progression Note (Addendum)
 Transition of Care [facility]) - Progression Note    Patient Details  Name: Kara Bailey MRN: [id number] Date of Birth: 07/21/1956  Transition of Care [facility] CM/SW Contact  Ander Kat[place], [place] Phone Number: 03/04/2024, 10:09 AM  Clinical Narrative: Per MD, possible d/c tomorrow back to [facility]. SNF updated. CMA to start authorization.     Update: Pt is not managed by [employer], so SNF will start auth. Debbie at [facility] updated.     Expected Discharge Plan: Skilled Nursing Facility Barriers to Discharge: Continued Medical Work up  Expected Discharge Plan and Services In-house Referral: Clinical Social Work Discharge Planning Services: CM Consult Post Acute Care Choice: Skilled Nursing Facility Living arrangements for the past 2 months: Skilled Nursing Facility                                       Social Determinants of Health (SDOH) Interventions SDOH Screenings   Food Insecurity: Patient Declined (03/03/2024)  Housing: Low Risk  (03/03/2024)  Transportation Needs: Patient Unable To Answer (03/03/2024)  Utilities: Patient Unable To Answer (03/03/2024)  Depression (PHQ2-9): High Risk (09/19/2022)  Social Connections: Patient Unable To Answer (03/03/2024)  Tobacco Use: Medium Risk (03/03/2024)    Readmission Risk Interventions    12/17/2022    1:42 PM  Readmission Risk Prevention Plan  Transportation Screening Complete  PCP or Specialist Appt within 3-5 Days Complete  HRI or Home Care Consult Complete  Social Work Consult for Recovery Care Planning/Counseling Complete  Palliative Care Screening Not Applicable  Medication Review Oceanographer) Referral to Pharmacy

## 2024-03-04 NOTE — NC FL2 (Signed)
 [place]  MEDICAID FL2 LEVEL OF CARE FORM     IDENTIFICATION  Patient Name: Kara Bailey Birthdate: 1956-01-22 Sex: female Admission Date (Current Location): 03/03/2024  [place] and [place] Number:  Lannie Pizza [id number] Facility and Address:  [facility],  [address], [place] [address]      Provider Number: [id number]  Attending Physician Name and Address:  Demaris Fillers, MD  Relative Name and Phone Number:       Current Level of Care: Hospital Recommended Level of Care: Skilled Nursing Facility Prior Approval Number:    Date Approved/Denied:   PASRR Number:    Discharge Plan: SNF    Current Diagnoses: Patient Active Problem List   Diagnosis Date Noted   Acute metabolic encephalopathy 03/03/2024   Generalized weakness 03/03/2024   Hemiparesis affecting left side as late effect of cerebrovascular accident (HCC) 02/18/2024   Dysarthria 02/18/2024   Decreased hemoglobin 12/28/2022   Heme positive stool 12/28/2022   Protein-calorie malnutrition, severe 12/04/2022   History of stroke 11/29/2022   Dysphagia as late effect of cerebrovascular accident (CVA) 11/29/2022   Stroke (HCC) 07/26/2022   Left hemiplegia (HCC) 07/26/2022   Peripheral arterial disease (HCC) 12/20/2020   History of esophageal stricture 07/08/2020   Insomnia 12/02/2017   Chronic cough 08/14/2017   Poor appetite 08/29/2016   Peptic stricture of esophagus    History of colonic polyps    Hiatal hernia 03/16/2016   Dysphagia 03/16/2016   Alcohol abuse 10/26/2015   Essential hypertension 09/26/2015   PAC (premature atrial contraction) 10/11/2014   PVC (premature ventricular contraction) 10/11/2014   Back muscle spasm 06/15/2014   Generalized anxiety disorder 06/15/2014   Smoking 06/15/2014   Chronic hepatitis C without hepatic coma (HCC) 05/28/2013    Orientation RESPIRATION BLADDER Height & Weight     Self  Normal External catheter Weight: 156 lb 15.5 oz (71.2 kg) Height:   5\' 6"  (167.6 cm)  BEHAVIORAL SYMPTOMS/MOOD NEUROLOGICAL BOWEL NUTRITION STATUS      Incontinent Diet (See d/c summary)  AMBULATORY STATUS COMMUNICATION OF NEEDS Skin   Extensive Assist Verbally Other (Comment) (Non-pressure wound to right hip with foam dressing. Rash to bilateral arms.)                       Personal Care Assistance Level of Assistance  Bathing, Feeding, Dressing Bathing Assistance: Maximum assistance Feeding assistance: Limited assistance Dressing Assistance: Maximum assistance     Functional Limitations Info  Sight, Hearing, Speech Sight Info: Adequate Hearing Info: Adequate Speech Info: Adequate    SPECIAL CARE FACTORS FREQUENCY  PT (By licensed PT), OT (By licensed OT)     PT Frequency: 5x weekly OT Frequency: 5x weekly            Contractures      Additional Factors Info  Code Status, Allergies, Isolation Precautions, Psychotropic Code Status Info: Full code Allergies Info: No known allergies Psychotropic Info: Xanax , Buspar , Celexa , Remeron    Isolation Precautions Info: Airborne/contact     Current Medications (03/04/2024):  This is the current hospital active medication list Current Facility-Administered Medications  Medication Dose Route Frequency Provider Last Rate Last Admin   acetaminophen  (TYLENOL ) tablet 650 mg  650 mg Oral Q4H PRN Tat, David, MD       Or   acetaminophen  (TYLENOL ) 160 MG/5ML solution 650 mg  650 mg Per Tube Q4H PRN Tat, David, MD       Or   acetaminophen  (TYLENOL ) suppository 650 mg  650 mg Rectal Q4H PRN Tat, Myrtie Atkinson, MD       aspirin  suppository 300 mg  300 mg Rectal Daily Tat, Myrtie Atkinson, MD   300 mg at 03/04/24 1204   atorvastatin  (LIPITOR ) tablet 80 mg  80 mg Oral Daily Tat, David, MD       busPIRone  (BUSPAR ) tablet 5 mg  5 mg Oral TID Tat, Myrtie Atkinson, MD       clopidogrel  (PLAVIX ) tablet 75 mg  75 mg Oral Daily Tat, David, MD       ezetimibe  (ZETIA ) tablet 10 mg  10 mg Oral Daily Tat, David, MD       heparin  injection  5,000 Units  5,000 Units Subcutaneous Q8H Tat, David, MD   5,000 Units at 03/04/24 1330   lactated ringers  infusion   Intravenous Continuous Tat, Myrtie Atkinson, MD       lactated ringers  infusion   Intravenous Continuous Demaris Fillers, MD 75 mL/hr at 03/04/24 1610 New Bag at 03/04/24 9604   mycophenolate  (CELLCEPT ) capsule 500 mg  500 mg Oral BID Tat, Myrtie Atkinson, MD       pantoprazole  (PROTONIX ) EC tablet 40 mg  40 mg Oral Daily Tat, David, MD       senna-docusate (Senokot-S) tablet 1 tablet  1 tablet Oral QHS PRN Tat, Myrtie Atkinson, MD       sodium chloride  flush (NS) 0.9 % injection 3-10 mL  3-10 mL Intravenous Q12H Tat, David, MD   10 mL at 03/03/24 2113   sodium chloride  flush (NS) 0.9 % injection 3-10 mL  3-10 mL Intravenous PRN Tat, Myrtie Atkinson, MD       thiamine  (VITAMIN B1) tablet 100 mg  100 mg Oral Daily Tat, David, MD       Vilazodone  HCl TABS 10 mg  10 mg Oral Daily Tat, Myrtie Atkinson, MD         Discharge Medications: Please see discharge summary for a list of discharge medications.  Relevant Imaging Results:  Relevant Lab Results:   Additional Information    Ander Katos, LCSW

## 2024-03-04 NOTE — Plan of Care (Signed)
 Problem: Education: Goal: Knowledge of General Education information will improve Description: Including pain rating scale, medication(s)/side effects and non-pharmacologic comfort measures 03/04/2024 0617 by Sherrye Dominion, RN Outcome: Progressing 03/03/2024 2208 by Sherrye Dominion, RN Outcome: Progressing   Problem: Health Behavior/Discharge Planning: Goal: Ability to manage health-related needs will improve 03/04/2024 0617 by Sherrye Dominion, RN Outcome: Progressing 03/03/2024 2208 by Sherrye Dominion, RN Outcome: Progressing   Problem: Clinical Measurements: Goal: Ability to maintain clinical measurements within normal limits will improve 03/04/2024 0617 by Sherrye Dominion, RN Outcome: Progressing 03/03/2024 2208 by Benino Korinek, RN Outcome: Progressing Goal: Will remain free from infection 03/04/2024 0617 by Sherrye Dominion, RN Outcome: Progressing 03/03/2024 2208 by Sherrye Dominion, RN Outcome: Progressing Goal: Diagnostic test results will improve 03/04/2024 0617 by Sherrye Dominion, RN Outcome: Progressing 03/03/2024 2208 by Raif Chachere, RN Outcome: Progressing Goal: Respiratory complications will improve 03/04/2024 0617 by Sherrye Dominion, RN Outcome: Progressing 03/03/2024 2208 by Arlie Posch, RN Outcome: Progressing Goal: Cardiovascular complication will be avoided 03/04/2024 0617 by Sherrye Dominion, RN Outcome: Progressing 03/03/2024 2208 by Nichoals Heyde, RN Outcome: Progressing   Problem: Activity: Goal: Risk for activity intolerance will decrease 03/04/2024 0617 by Sherrye Dominion, RN Outcome: Progressing 03/03/2024 2208 by Sherrye Dominion, RN Outcome: Progressing   Problem: Nutrition: Goal: Adequate nutrition will be maintained 03/04/2024 0617 by Sherrye Dominion, RN Outcome: Progressing 03/03/2024 2208 by Sherrye Dominion, RN Outcome: Progressing   Problem: Coping: Goal: Level of anxiety will decrease 03/04/2024 0617 by Sherrye Dominion, RN Outcome: Progressing 03/03/2024 2208 by Sherrye Dominion, RN Outcome: Progressing   Problem:  Elimination: Goal: Will not experience complications related to bowel motility 03/04/2024 0617 by Sherrye Dominion, RN Outcome: Progressing 03/03/2024 2208 by Sherrye Dominion, RN Outcome: Progressing Goal: Will not experience complications related to urinary retention 03/04/2024 0617 by Sherrye Dominion, RN Outcome: Progressing 03/03/2024 2208 by Sherrye Dominion, RN Outcome: Progressing   Problem: Pain Managment: Goal: General experience of comfort will improve and/or be controlled 03/04/2024 0617 by Sherrye Dominion, RN Outcome: Progressing 03/03/2024 2208 by Sherrye Dominion, RN Outcome: Progressing   Problem: Safety: Goal: Ability to remain free from injury will improve 03/04/2024 0617 by Sherrye Dominion, RN Outcome: Progressing 03/03/2024 2208 by Sherrye Dominion, RN Outcome: Progressing   Problem: Skin Integrity: Goal: Risk for impaired skin integrity will decrease 03/04/2024 0617 by Sherrye Dominion, RN Outcome: Progressing 03/03/2024 2208 by Sherrye Dominion, RN Outcome: Progressing   Problem: Education: Goal: Knowledge of disease or condition will improve 03/04/2024 0617 by Sherrye Dominion, RN Outcome: Progressing 03/03/2024 2208 by Sherrye Dominion, RN Outcome: Progressing Goal: Knowledge of secondary prevention will improve (MUST DOCUMENT ALL) 03/04/2024 0617 by Sherrye Dominion, RN Outcome: Progressing 03/03/2024 2208 by Sherrye Dominion, RN Outcome: Progressing Goal: Knowledge of patient specific risk factors will improve (DELETE if not current risk factor) 03/04/2024 0617 by Sherrye Dominion, RN Outcome: Progressing 03/03/2024 2208 by Sherrye Dominion, RN Outcome: Progressing   Problem: Ischemic Stroke/TIA Tissue Perfusion: Goal: Complications of ischemic stroke/TIA will be minimized 03/04/2024 0617 by Sherrye Dominion, RN Outcome: Progressing 03/03/2024 2208 by Sherrye Dominion, RN Outcome: Progressing   Problem: Coping: Goal: Will verbalize positive feelings about self 03/04/2024 0617 by Sherrye Dominion, RN Outcome: Progressing 03/03/2024 2208 by Jaylynn Siefert,  RN Outcome: Progressing Goal: Will identify appropriate support needs 03/04/2024 0617 by Sherrye Dominion, RN Outcome: Progressing 03/03/2024 2208 by Sherrye Dominion, RN Outcome: Progressing   Problem: Health Behavior/Discharge Planning: Goal: Ability to manage health-related needs will improve 03/04/2024 0617 by Sherrye Dominion, RN Outcome: Progressing 03/03/2024 2208 by  Onaje Warne, RN Outcome: Progressing Goal: Goals will be collaboratively established with patient/family 03/04/2024 0617 by Sherrye Dominion, RN Outcome: Progressing 03/03/2024 2208 by Sherrye Dominion, RN Outcome: Progressing   Problem: Self-Care: Goal: Ability to participate in self-care as condition permits will improve 03/04/2024 0617 by Sherrye Dominion, RN Outcome: Progressing 03/03/2024 2208 by Sherrye Dominion, RN Outcome: Progressing Goal: Verbalization of feelings and concerns over difficulty with self-care will improve 03/04/2024 0617 by Sherrye Dominion, RN Outcome: Progressing 03/03/2024 2208 by Dalis Beers, RN Outcome: Progressing Goal: Ability to communicate needs accurately will improve 03/04/2024 0617 by Sherrye Dominion, RN Outcome: Progressing 03/03/2024 2208 by Sherrye Dominion, RN Outcome: Progressing   Problem: Nutrition: Goal: Risk of aspiration will decrease 03/04/2024 0617 by Sherrye Dominion, RN Outcome: Progressing 03/03/2024 2208 by Sherrye Dominion, RN Outcome: Progressing Goal: Dietary intake will improve 03/04/2024 0617 by Sherrye Dominion, RN Outcome: Progressing 03/03/2024 2208 by Traylen Eckels, RN Outcome: Progressing

## 2024-03-04 NOTE — Evaluation (Signed)
 Occupational Therapy Evaluation Patient Details Name: VOLANDA MANGINE MRN: [id number] DOB: [birth year] Today's Date: 03/04/2024   History of Present Illness   Kara Bailey is a 68 year old female with a history of stroke and left hemiparesis, anxiety, depression, hyperlipidemia presenting with speech abnormality.  Apparently, the patient resides at [facility].  She was sent over because of concerns of her speech and possibly her dysarthria.  Attempts were made to contact the facility without success x 3.  In speaking with the patient's son, he stated that he last saw the patient about 2 weeks prior to this admission.  According to son, the patient is wheelchair-bound at baseline, but she is able to feed herself.  He states that she is normally able to speak coherently although sometimes a little bit slow.  She is sometimes slow to process information.  However, he states that she is normally able to state her name, and place and occasionally the date.  In the ED, the patient is awake and alert.  She denies any chest pain, shortness of breath, vomiting, diarrhea.  She does complain of some abdominal pain.  She states that has been going on for about 3 days.  She denies any dysuria.  She denies any headache or neck pain.  In the the patient is afebrile hemodynamically stable. (per MD)     Clinical Impressions Pt agreeable to OT and PT co-evaluation. Pt is assisted at baseline for ADL's and mobility. L LE not used functionally due to deficits from previous stroke. Pt required min A for bed mobility and mod A for lateral squat pivot transfer today. Pt able to communicate using paper and pencil. Pt left in the chair with call bel within reach and chair alarm set. Pt will benefit from continued OT in the hospital and recommended venue below to increase strength, balance, and endurance for safe ADL's.        If plan is discharge home, recommend the following:   A lot of help with walking and/or  transfers;A lot of help with bathing/dressing/bathroom;Assistance with cooking/housework;Assist for transportation;Help with stairs or ramp for entrance     Functional Status Assessment   Patient has had a recent decline in their functional status and demonstrates the ability to make significant improvements in function in a reasonable and predictable amount of time.     Equipment Recommendations   None recommended by OT             Precautions/Restrictions   Precautions Precautions: Fall Required Braces or Orthoses: Other Brace (Pt reported that she normally wears a brace on her L LE.) Restrictions Weight Bearing Restrictions Per Provider Order: No     Mobility Bed Mobility Overal bed mobility: Needs Assistance Bed Mobility: Supine to Sit     Supine to sit: Min assist, HOB elevated     General bed mobility comments: labored movement; single hand held assist.    Transfers Overall transfer level: Needs assistance   Transfers: Bed to chair/wheelchair/BSC            Lateral/Scoot Transfers: Mod assist General transfer comment: labored movement; assist to boost and pivot to chair.      Balance Overall balance assessment: Needs assistance Sitting-balance support: No upper extremity supported, Feet supported Sitting balance-Leahy Scale: Fair Sitting balance - Comments: seated at EOB  ADL either performed or assessed with clinical judgement   ADL Overall ADL's : Needs assistance/impaired Eating/Feeding: Set up;Sitting   Grooming: Minimal assistance;Sitting   Upper Body Bathing: Moderate assistance;Sitting;Minimal assistance   Lower Body Bathing: Maximal assistance;Sitting/lateral leans   Upper Body Dressing : Minimal assistance;Moderate assistance;Sitting   Lower Body Dressing: Maximal assistance;Total assistance;Sitting/lateral leans   Toilet Transfer: Moderate assistance (lateral transfer) Toilet  Transfer Details (indicate cue type and reason): simulated via EOB to chair transfer Toileting- Clothing Manipulation and Hygiene: Total assistance;Maximal assistance;Bed level               Vision Baseline Vision/History: 1 Wears glasses Ability to See in Adequate Light: 1 Impaired Patient Visual Report: No change from baseline Vision Assessment?: No apparent visual deficits     Perception Perception: Not tested       Praxis Praxis: Not tested       Pertinent Vitals/Pain Pain Assessment Pain Assessment: No/denies pain     Extremity/Trunk Assessment Upper Extremity Assessment Upper Extremity Assessment: RUE deficits/detail;LUE deficits/detail RUE Deficits / Details: General weakness. Good funcitonal use. RUE Coordination: WNL LUE Deficits / Details: Severely limited from baseline stroke. Moderate tone throughout. Limited P/ROM. LUE Coordination: decreased fine motor;decreased gross motor   Lower Extremity Assessment Lower Extremity Assessment: Defer to PT evaluation       Communication Communication Communication: Impaired Factors Affecting Communication: Difficulty expressing self;Other (comment) (Pt would nod and gesture. Pt using paper and pencil at the end of the session to discuss use of brace at baseline.)   Cognition Arousal: Alert Behavior During Therapy: [facility] for tasks assessed/performed Cognition: No apparent impairments                               Following commands: Impaired Following commands impaired: Only follows one step commands consistently     Cueing  General Comments   Cueing Techniques: Verbal cues;Tactile cues                 Home Living Family/patient expects to be discharged to:: Skilled nursing facility                                        Prior Functioning/Environment Prior Level of Function : Needs assist       Physical Assist : ADLs (physical);Mobility (physical) Mobility (physical):  Transfers;Bed mobility;Gait;Stairs ADLs (physical): Bathing;Dressing;Toileting;IADLs;Grooming Mobility Comments: Pt assited for transfers at facility. ADLs Comments: Assit by SNF staff. Pt can reportedly feed her self per chart review and pt seeming to confirm that.    OT Problem List: Decreased strength;Decreased range of motion;Decreased activity tolerance;Impaired balance (sitting and/or standing)   OT Treatment/Interventions: Self-care/ADL training;Therapeutic exercise;Neuromuscular education;Therapeutic activities;Balance training;Patient/family education      OT Goals(Current goals can be found in the care plan section)   Acute Rehab OT Goals Patient Stated Goal: seemingly agreeable to therapy at rehab OT Goal Formulation: With patient Time For Goal Achievement: 03/18/24 Potential to Achieve Goals: Good   OT Frequency:  Min 1X/week    Co-evaluation PT/OT/SLP Co-Evaluation/Treatment: Yes Reason for Co-Treatment: To address functional/ADL transfers   OT goals addressed during session: ADL's and self-care      AM-PAC OT "6 Clicks" Daily Activity     Outcome Measure Help from another person eating meals?: A Little Help from another person taking care of personal grooming?: A  Lot Help from another person toileting, which includes using toliet, bedpan, or urinal?: A Lot Help from another person bathing (including washing, rinsing, drying)?: A Lot Help from another person to put on and taking off regular upper body clothing?: A Lot Help from another person to put on and taking off regular lower body clothing?: A Lot 6 Click Score: 13   End of Session Nurse Communication: Mobility status  Activity Tolerance: Patient tolerated treatment well Patient left: in chair;with call bell/phone within reach;with chair alarm set  OT Visit Diagnosis: Unsteadiness on feet (R26.81);Other abnormalities of gait and mobility (R26.89);Muscle weakness (generalized) (M62.81);Other symptoms and  signs involving the nervous system (R29.898)                Time: 9147-8295 OT Time Calculation (min): 17 min Charges:  OT General Charges $OT Visit: 1 Visit OT Evaluation $OT Eval Low Complexity: 1 Low  Antoinne Spadaccini OT, MOT  Thurnell Floss 03/04/2024, 9:35 AM

## 2024-03-05 DIAGNOSIS — R531 Weakness: Secondary | ICD-10-CM | POA: Diagnosis not present

## 2024-03-05 DIAGNOSIS — R4182 Altered mental status, unspecified: Secondary | ICD-10-CM

## 2024-03-05 DIAGNOSIS — G9341 Metabolic encephalopathy: Secondary | ICD-10-CM | POA: Diagnosis not present

## 2024-03-05 DIAGNOSIS — R471 Dysarthria and anarthria: Secondary | ICD-10-CM | POA: Diagnosis not present

## 2024-03-05 DIAGNOSIS — R569 Unspecified convulsions: Secondary | ICD-10-CM

## 2024-03-05 DIAGNOSIS — I10 Essential (primary) hypertension: Secondary | ICD-10-CM | POA: Diagnosis not present

## 2024-03-05 LAB — MAGNESIUM: Magnesium: 1.9 mg/dL (ref 1.7–2.4)

## 2024-03-05 LAB — BASIC METABOLIC PANEL WITH GFR
Anion gap: 11 (ref 5–15)
BUN: 14 mg/dL (ref 8–23)
CO2: 25 mmol/L (ref 22–32)
Calcium: 9.3 mg/dL (ref 8.9–10.3)
Chloride: 105 mmol/L (ref 98–111)
Creatinine, Ser: 1.15 mg/dL — ABNORMAL HIGH (ref 0.44–1.00)
GFR, Estimated: 51 mL/min — ABNORMAL LOW (ref >60)
Glucose, Bld: 95 mg/dL (ref 70–99)
Potassium: 3.6 mmol/L (ref 3.5–5.1)
Sodium: 141 mmol/L (ref 135–145)

## 2024-03-05 MED ORDER — PANTOPRAZOLE SODIUM 40 MG PO TBEC: 40.0000 mg | DELAYED_RELEASE_TABLET | Freq: Every day | ORAL | Status: DC

## 2024-03-05 MED ORDER — ACETAMINOPHEN 325 MG PO TABS: 650.0000 mg | ORAL_TABLET | Freq: Four times a day (QID) | ORAL | Status: AC | PRN

## 2024-03-05 MED ORDER — ASPIRIN 81 MG PO TBEC: 81.0000 mg | DELAYED_RELEASE_TABLET | Freq: Every day | ORAL | Status: DC

## 2024-03-05 MED ORDER — ASPIRIN 81 MG PO TBEC
81.0000 mg | DELAYED_RELEASE_TABLET | Freq: Every day | ORAL | Status: DC
Start: 2024-03-06 — End: 2024-03-06

## 2024-03-05 MED ORDER — ATORVASTATIN CALCIUM 80 MG PO TABS: 80.0000 mg | ORAL_TABLET | Freq: Every evening | ORAL | Status: AC

## 2024-03-05 MED ORDER — BUSPIRONE HCL 5 MG PO TABS: 5.0000 mg | ORAL_TABLET | Freq: Three times a day (TID) | ORAL | Status: AC

## 2024-03-05 NOTE — TOC Transition Note (Signed)
 Transition of Care [facility]) - Discharge Note   Patient Details  Name: Kara Bailey MRN: [id number] Date of Birth: 28-Oct-1956  Transition of Care [facility]) CM/SW Contact:  Linnea Richards, LCSW Phone Number: 03/05/2024, 1:25 PM   Clinical Narrative:     Pt medically stable for dc per MD. Stephenie Einstein at [facility] states they have received rehab auth from [employer] and she can return today.  Updated pt's daughter, Lovett Ruck, who is in agreement with dc plan.   DC clinical sent electronically. RN to call report. EMS arranged.  No other TOC needs for dc.   Final next level of care: Skilled Nursing Facility Barriers to Discharge: Barriers Resolved   Patient Goals and CMS Choice Patient states their goals for this hospitalization and ongoing recovery are:: Return to baseline. CMS Medicare.gov Compare Post Acute Care list provided to:: Patient Choice offered to / list presented to : Patient [place] ownership interest in [facility].provided to:: Patient    Discharge Placement                       Discharge Plan and Services Additional resources added to the After Visit Summary for   In-house Referral: Clinical Social Work Discharge Planning Services: CM Consult Post Acute Care Choice: Skilled Nursing Facility                               Social Drivers of Health (SDOH) Interventions SDOH Screenings   Food Insecurity: Patient Declined (03/03/2024)  Housing: Low Risk  (03/03/2024)  Transportation Needs: Patient Unable To Answer (03/03/2024)  Utilities: Patient Unable To Answer (03/03/2024)  Depression (PHQ2-9): High Risk (09/19/2022)  Social Connections: Patient Unable To Answer (03/03/2024)  Tobacco Use: Medium Risk (03/03/2024)     Readmission Risk Interventions    12/17/2022    1:42 PM  Readmission Risk Prevention Plan  Transportation Screening Complete  PCP or Specialist Appt within 3-5 Days Complete  HRI or Home Care Consult Complete   Social Work Consult for Recovery Care Planning/Counseling Complete  Palliative Care Screening Not Applicable  Medication Review [employer]) Referral to Pharmacy

## 2024-03-05 NOTE — Plan of Care (Signed)

## 2024-03-05 NOTE — Procedures (Signed)
 Patient Name: Kara Bailey  MRN: [id number]  Epilepsy Attending: Arleene Lack  Referring Physician/Provider: Demaris Fillers, MD Date: 03/04/2024 Duration: 22.10 mins  Patient history: 68yo F with ams. EEG to evaluate for seizure  Level of alertness: Awake  AEDs during EEG study: None  Technical aspects: This EEG study was done with scalp electrodes positioned according to the 10-20 International system of electrode placement. Electrical activity was reviewed with band pass filter of 1-70Hz , sensitivity of 7 uV/mm, display speed of 38mm/sec with a 60Hz  notched filter applied as appropriate. EEG data were recorded continuously and digitally stored.  Video monitoring was available and reviewed as appropriate.  Description: The posterior dominant rhythm consists of 8 Hz activity of moderate voltage (25-35 uV) seen predominantly in posterior head regions, symmetric and reactive to eye opening and eye closing. Hyperventilation and photic stimulation were not performed.     IMPRESSION: This study is within normal limits. No seizures or epileptiform discharges were seen throughout the recording.  A normal interictal EEG does not exclude the diagnosis of epilepsy.  [name] O [name]

## 2024-03-05 NOTE — Discharge Summary (Signed)
 Physician Discharge Summary  Kara Bailey UJW:[id number] DOB: 03-07-1956 DOA: 03/03/2024  PCP: Forestine Igo, DO  Admit date: 03/03/2024 Discharge date: 03/05/2024  Disposition:  SNF   Recommendations for Outpatient Follow-up:  Follow up with PCP in 1-2 weeks Please obtain BMP/CBC in one week  Discharge Condition: STABLE   CODE STATUS: FULL DIET: Dys 2, assist with feeding   Brief Hospitalization Summary: Please see all hospital notes, images, labs for full details of the hospitalization. Admission provider HPI:  68 year old female with a history of stroke and left hemiparesis, anxiety, depression, hyperlipidemia presenting with speech abnormality.  Apparently, the patient resides at [facility].  She was sent over because of concerns of her speech and possibly her dysarthria.  Attempts were made to contact the facility without success x 3.  In speaking with the patient's son, he stated that he last saw the patient about 2 weeks prior to this admission.  According to son, the patient is wheelchair-bound at baseline, but she is able to feed herself.  He states that she is normally able to speak coherently although sometimes a little bit slow.  She is sometimes slow to process information.  However, he states that she is normally able to state her name, and place and occasionally the date.  In the ED, the patient is awake and alert.  She denies any chest pain, shortness of breath, vomiting, diarrhea.  She does complain of some abdominal pain.  She states that has been going on for about 3 days.  She denies any dysuria.  She denies any headache or neck pain. In the the patient is afebrile hemodynamically stable. WBC 10.9, hemoglobin 14.6, platelets 265.  Sodium 145, potassium 4.2, bicarbonate 26, serum creatinine 1.29.  LFTs were unremarkable.  Troponin 9.  EKG shows sinus rhythm with nonspecific T wave changes.  CT of the brain was negative for any acute findings.  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  The patient was seen by neurology who recommended admission for further workup.  They did not feel the patient qualified for thrombolytics.  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 - CT brain negative for acute findings - 03/03/24 A1c--5.5 - Check lipid panel--LDL 29 - PT/OT - SNF recommended - Speech therapy evaluation>>dys 2 with thin   Acute metabolic encephalopathy/generalized weakness - due to AKI and dehydration - improving with IVF - Obtain UA--no pyuria - VBG-- 7.41/47/<31/29 - TSH--0.326 - B12--827 - Ammonia--13 - CT brain negative for acute findings - MRI brain--neg for acute findings - CT chest/abd--no acute findings -hold baclofen /atarax -EEG no epileptiform activity reported    History of stroke - Patient has residual left hemiparesis - PT/OT evaluation - Continue aspirin  and plavix    Abdominal pain - CT abdomen and pelvis--no acute findings - now tolerating diet (dys 2)   Atypical chest pain - Cycle troponins neg - Personally reviewed EKG--sinus rhythm, nonspecific T wave change   Anxiety/depression - Holding Remeron  - continue vilazodone  and buspirone    Bullous pemphigoid - Continue mycophenolate  and wound care    Mixed hyperlipidemia -Continue - Continue statin  Discharge Diagnoses:  Principal Problem:  Dysarthria Active Problems:   Essential hypertension   Acute metabolic encephalopathy   Generalized weakness   Discharge Instructions:  Allergies as of 03/05/2024   No Known Allergies      Medication List     STOP taking these medications    famotidine  20 MG  tablet Commonly known as: PEPCID    fluconazole 150 MG tablet Commonly known as: DIFLUCAN   hydrOXYzine 25 MG tablet Commonly known as: ATARAX   magnesium  oxide 400 (240 Mg) MG tablet Commonly known as: MAG-OX   melatonin 3 MG Tabs tablet   MENTHOL-METHYL SALICYLATE EX   mirtazapine  15 MG tablet Commonly known as: REMERON    multivitamin with minerals Tabs tablet   oxycodone  5 MG capsule Commonly known as: OXY-IR       TAKE these medications    acetaminophen  325 MG tablet Commonly known as: TYLENOL  Take 2 tablets (650 mg total) by mouth every 6 (six) hours as needed for mild pain (pain score 1-3), fever, headache or moderate pain (pain score 4-6). What changed:  how much to take when to take this reasons to take this   albuterol  108 (90 Base) MCG/ACT inhaler Commonly known as: VENTOLIN  HFA Inhale 2 puffs into the lungs every 6 (six) hours as needed for wheezing or shortness of breath.   aspirin  EC 81 MG tablet Take 81 mg by mouth daily. Swallow whole.   atorvastatin  80 MG tablet Commonly known as: LIPITOR  Take 1 tablet (80 mg total) by mouth every evening. What changed: when to take this   busPIRone  5 MG tablet Commonly known as: BUSPAR  Take 1 tablet (5 mg total) by mouth 3 (three) times daily. What changed: how much to take   Calcium  1000 + D 1000-20 MG-MCG Tabs Generic drug: Calcium  Carb-Cholecalciferol Take 1 tablet by mouth daily.   chlorhexidine  4 % external liquid Commonly known as: HIBICLENS  Apply 1 Application topically 2 (two) times a week. Clean open lesions with Chlorhexidine  soap, then apply Gentian Violet to lesions, apply Vaseline, wrap with Kerlix and Coban twice weekly/PRN every day shift every Tues and Fri for wound treatment   clopidogrel  75 MG tablet Commonly known as: PLAVIX  Take 1 tablet (75 mg total) by mouth daily.   ezetimibe  10 MG tablet Commonly known as: ZETIA  Take 10 mg by mouth daily.   fluticasone 50 MCG/ACT nasal  spray Commonly known as: FLONASE Place 2 sprays into both nostrils daily.   gentian violet 1 % topical solution Apply 0.5 mLs topically 2 (two) times a week. Apply to open wounds every Tues and Fri and then cover with dry covered dressing wrap with ACE wrap and secure with tape   hydroxypropyl methylcellulose / hypromellose 2.5 % ophthalmic solution Commonly known as: ISOPTO TEARS / GONIOVISC Place 1 drop into both eyes in the morning and at bedtime.   mycophenolate  500 MG tablet Commonly known as: CELLCEPT  Take 1,000 mg by mouth daily.   ondansetron  4 MG disintegrating tablet Commonly known as: ZOFRAN -ODT Take 4 mg by mouth every 8 (eight) hours as needed for nausea or vomiting.   pantoprazole  40 MG tablet Commonly known as: PROTONIX  Take 1 tablet (40 mg total) by mouth daily.   polyethylene glycol 17 g packet Commonly known as: MIRALAX  / GLYCOLAX  Take 17 g by mouth daily as needed. What changed: when to take this   thiamine  100 MG tablet Commonly known as: Vitamin B-1 Take 100 mg by mouth daily.   Vilazodone  HCl 10 MG Tabs Commonly known as:  VIIBRYD  Take 10 mg by mouth daily.        Follow-up Information     Forestine Igo, DO Follow up.   Specialty: General Practice Why: Hospital Follow Up Contact information: [address] [place] [address] [phone]                No Known Allergies Allergies as of 03/05/2024   No Known Allergies      Medication List     STOP taking these medications    famotidine  20 MG tablet Commonly known as: PEPCID    fluconazole 150 MG tablet Commonly known as: DIFLUCAN   hydrOXYzine 25 MG tablet Commonly known as: ATARAX   magnesium  oxide 400 (240 Mg) MG tablet Commonly known as: MAG-OX   melatonin 3 MG Tabs tablet   MENTHOL-METHYL SALICYLATE EX   mirtazapine  15 MG tablet Commonly known as: REMERON    multivitamin with minerals Tabs tablet   oxycodone  5 MG capsule Commonly known as: OXY-IR        TAKE these medications    acetaminophen  325 MG tablet Commonly known as: TYLENOL  Take 2 tablets (650 mg total) by mouth every 6 (six) hours as needed for mild pain (pain score 1-3), fever, headache or moderate pain (pain score 4-6). What changed:  how much to take when to take this reasons to take this   albuterol  108 (90 Base) MCG/ACT inhaler Commonly known as: VENTOLIN  HFA Inhale 2 puffs into the lungs every 6 (six) hours as needed for wheezing or shortness of breath.   aspirin  EC 81 MG tablet Take 81 mg by mouth daily. Swallow whole.   atorvastatin  80 MG tablet Commonly known as: LIPITOR  Take 1 tablet (80 mg total) by mouth every evening. What changed: when to take this   busPIRone  5 MG tablet Commonly known as: BUSPAR  Take 1 tablet (5 mg total) by mouth 3 (three) times daily. What changed: how much to take   Calcium  1000 + D 1000-20 MG-MCG Tabs Generic drug: Calcium  Carb-Cholecalciferol Take 1 tablet by mouth daily.   chlorhexidine  4 % external liquid Commonly known as: HIBICLENS  Apply 1 Application topically 2 (two) times a week. Clean open lesions with Chlorhexidine  soap, then apply Gentian Violet to lesions, apply Vaseline, wrap with Kerlix and Coban twice weekly/PRN every day shift every Tues and Fri for wound treatment   clopidogrel  75 MG tablet Commonly known as: PLAVIX  Take 1 tablet (75 mg total) by mouth daily.   ezetimibe  10 MG tablet Commonly known as: ZETIA  Take 10 mg by mouth daily.   fluticasone 50 MCG/ACT nasal spray Commonly known as: FLONASE Place 2 sprays into both nostrils daily.   gentian violet 1 % topical solution Apply 0.5 mLs topically 2 (two) times a week. Apply to open wounds every Tues and Fri and then cover with dry covered dressing wrap with ACE wrap and secure with tape   hydroxypropyl methylcellulose / hypromellose 2.5 % ophthalmic solution Commonly known as: ISOPTO TEARS / GONIOVISC Place 1 drop into both eyes in the morning  and at bedtime.   mycophenolate  500 MG tablet Commonly known as: CELLCEPT  Take 1,000 mg by mouth daily.   ondansetron  4 MG disintegrating tablet Commonly known as: ZOFRAN -ODT Take 4 mg by mouth every 8 (eight) hours as needed for nausea or vomiting.   pantoprazole  40 MG tablet Commonly known as: PROTONIX  Take 1 tablet (40 mg total) by mouth daily.   polyethylene glycol 17 g packet Commonly known as: MIRALAX  / GLYCOLAX  Take  17 g by mouth daily as needed. What changed: when to take this   thiamine  100 MG tablet Commonly known as: Vitamin B-1 Take 100 mg by mouth daily.   Vilazodone  HCl 10 MG Tabs Commonly known as: VIIBRYD  Take 10 mg by mouth daily.        Procedures/Studies: EEG adult Result Date: 03/05/2024 Arleene Lack, MD     03/05/2024  8:47 AM Patient Name: Kara Bailey MRN: [id number] Epilepsy Attending: Arleene Lack Referring Physician/Provider: Demaris Fillers, MD Date: 03/04/2024 Duration: 22.10 mins Patient history: 68yo F with ams. EEG to evaluate for seizure Level of alertness: Awake AEDs during EEG study: None Technical aspects: This EEG study was done with scalp electrodes positioned according to the 10-20 International system of electrode placement. Electrical activity was reviewed with band pass filter of 1-70Hz , sensitivity of 7 uV/mm, display speed of 55mm/sec with a 60Hz  notched filter applied as appropriate. EEG data were recorded continuously and digitally stored.  Video monitoring was available and reviewed as appropriate. Description: The posterior dominant rhythm consists of 8 Hz activity of moderate voltage (25-35 uV) seen predominantly in posterior head regions, symmetric and reactive to eye opening and eye closing. Hyperventilation and photic stimulation were not performed.   IMPRESSION: This study is within normal limits. No seizures or epileptiform discharges were seen throughout the recording. A normal interictal EEG does not exclude the diagnosis of  epilepsy. Arleene Lack   ECHOCARDIOGRAM COMPLETE Result Date: 03/04/2024    ECHOCARDIOGRAM REPORT   Patient Name:   Kara Bailey Date of Exam: 03/04/2024 Medical Rec #:  [id number]        Height:       66.0 in Accession #:    [id number]       Weight:       157.0 lb Date of Birth:  08-03-1956         BSA:          1.804 m Patient Age:    68 years         BP:           153/73 mmHg Patient Gender: F                HR:           88 bpm. Exam Location:  [facility] Procedure: 2D Echo, Cardiac Doppler, Color Doppler and Intracardiac            Opacification Agent (Both Spectral and Color Flow Doppler were            utilized during procedure). Indications:    Stroke l63.9  History:        Patient has prior history of Echocardiogram examinations, most                 recent 07/26/2022. Stroke, Arrythmias:Tachycardia, PAC and PVC;                 Risk Factors:Hypertension.  Sonographer:    Denese Finn RCS Referring Phys: [phone] DAVID TAT IMPRESSIONS  1. Left ventricular ejection fraction, by estimation, is >75%. The left ventricle has hyperdynamic function. The left ventricle has no regional wall motion abnormalities. There is moderate left ventricular hypertrophy. Left ventricular diastolic parameters are consistent with Grade I diastolic dysfunction (impaired relaxation).  2. Right ventricular systolic function is normal. The right ventricular size is normal.  3. The mitral valve is normal in structure. No evidence of mitral valve regurgitation. No evidence  of mitral stenosis.  4. The aortic valve is tricuspid. There is mild calcification of the aortic valve. There is mild thickening of the aortic valve. Aortic valve regurgitation is not visualized. No aortic stenosis is present. FINDINGS  Left Ventricle: Left ventricular ejection fraction, by estimation, is >75%. The left ventricle has hyperdynamic function. The left ventricle has no regional wall motion abnormalities. Definity  contrast agent was given IV to  delineate the left ventricular endocardial borders. The left ventricular internal cavity size was normal in size. There is moderate left ventricular hypertrophy. Left ventricular diastolic parameters are consistent with Grade I diastolic dysfunction (impaired relaxation). Right Ventricle: The right ventricular size is normal. Right vetricular wall thickness was not well visualized. Right ventricular systolic function is normal. Left Atrium: Left atrial size was normal in size. Right Atrium: Right atrial size was normal in size. Pericardium: There is no evidence of pericardial effusion. Mitral Valve: The mitral valve is normal in structure. No evidence of mitral valve regurgitation. No evidence of mitral valve stenosis. Tricuspid Valve: The tricuspid valve is normal in structure. Tricuspid valve regurgitation is not demonstrated. No evidence of tricuspid stenosis. Aortic Valve: The aortic valve is tricuspid. There is mild calcification of the aortic valve. There is mild thickening of the aortic valve. There is mild aortic valve annular calcification. Aortic valve regurgitation is not visualized. No aortic stenosis  is present. Aortic valve mean gradient measures 9.0 mmHg. Aortic valve peak gradient measures 15.5 mmHg. Aortic valve area, by VTI measures 1.60 cm. Pulmonic Valve: The pulmonic valve was not well visualized. Pulmonic valve regurgitation is not visualized. No evidence of pulmonic stenosis. Aorta: The aortic root is normal in size and structure. Venous: IVC is small suggesting low RA pressure and hypovolemia. IAS/Shunts: The interatrial septum was not well visualized.  LEFT VENTRICLE PLAX 2D LVIDd:         3.10 cm   Diastology LVIDs:         2.07 cm   LV e' medial:    3.70 cm/s LV PW:         1.30 cm   LV E/e' medial:  15.8 LV IVS:        1.40 cm   LV e' lateral:   5.98 cm/s LVOT diam:     1.50 cm   LV E/e' lateral: 9.8 LV SV:         51 LV SV Index:   28 LVOT Area:     1.77 cm  RIGHT VENTRICLE RV S  prime:     16.50 cm/s TAPSE (M-mode): 2.0 cm LEFT ATRIUM             Index        RIGHT ATRIUM           Index LA diam:        3.10 cm 1.72 cm/m   RA Area:     10.90 cm LA Vol (A2C):   38.1 ml 21.12 ml/m  RA Volume:   24.60 ml  13.64 ml/m LA Vol (A4C):   38.1 ml 21.12 ml/m LA Biplane Vol: 38.5 ml 21.34 ml/m  AORTIC VALVE AV Area (Vmax):    1.58 cm AV Area (Vmean):   1.85 cm AV Area (VTI):     1.60 cm AV Vmax:           197.00 cm/s AV Vmean:          132.000 cm/s AV VTI:  0.319 m AV Peak Grad:      15.5 mmHg AV Mean Grad:      9.0 mmHg LVOT Vmax:         176.00 cm/s LVOT Vmean:        138.500 cm/s LVOT VTI:          0.288 m LVOT/AV VTI ratio: 0.90  AORTA Ao Root diam: 3.00 cm MITRAL VALVE MV Area (PHT): 2.03 cm     SHUNTS MV Decel Time: 373 msec     Systemic VTI:  0.29 m MV E velocity: 58.50 cm/s   Systemic Diam: 1.50 cm MV A velocity: 144.00 cm/s MV E/A ratio:  0.41 Armida Lander MD Electronically signed by Armida Lander MD Signature Date/Time: 03/04/2024/2:54:36 PM    Final    MR Brain Wo Contrast (neuro protocol) Result Date: 03/03/2024 CLINICAL DATA:  Provided history: Neuro deficit, acute, stroke suspected. EXAM: MRI HEAD WITHOUT CONTRAST TECHNIQUE: Multiplanar, multiecho pulse sequences of the brain and surrounding structures were obtained without intravenous contrast. COMPARISON:  Non-contrast head CT and CT angiogram head/neck performed earlier today 03/03/2024. Brain MRI 12/01/2022. FINDINGS: Brain: Mild-to-moderate generalized cerebral atrophy. Mild cerebellar atrophy. Chronic perforator infarct within the right corona radiata/basal ganglia. Chronic hemosiderin deposition at this site. Chronic lacunar infarcts elsewhere within/about the bilateral deep gray nuclei. Background multifocal T2 FLAIR hyperintense signal abnormality within the cerebral white matter, basal ganglia and pons compatible with chronic small vessel ischemic disease. Moderate-sized chronic infarct (with  associated chronic blood products) in the superior right cerebellar hemisphere. There is no acute infarct. No evidence of an intracranial mass. No extra-axial fluid collection. No midline shift. Vascular: Maintained flow voids within the proximal large arterial vessels. Skull and upper cervical spine: No focal worrisome marrow lesion. Incompletely assessed cervical spondylosis. Sinuses/Orbits: No mass or acute finding within the imaged orbits. Prior bilateral ocular lens replacement. No significant paranasal sinus disease. IMPRESSION: 1.  No evidence of an acute intracranial abnormality. 2. Parenchymal atrophy, chronic small vessel disease and unchanged chronic infarcts, as described within the body of the report. Electronically Signed   By: Bascom Lily D.O.   On: 03/03/2024 17:08   CT CHEST ABDOMEN PELVIS WO CONTRAST Result Date: 03/03/2024 CLINICAL DATA:  Abdominal pain, acute, nonlocalized EXAM: CT CHEST, ABDOMEN AND PELVIS WITHOUT CONTRAST TECHNIQUE: Multidetector CT imaging of the chest, abdomen and pelvis was performed following the standard protocol without IV contrast. RADIATION DOSE REDUCTION: This exam was performed according to the departmental dose-optimization program which includes automated exposure control, adjustment of the mA and/or kV according to patient size and/or use of iterative reconstruction technique. COMPARISON:  February 05, 2023 FINDINGS: Of note, the lack of intravenous contrast limits evaluation of the solid organ parenchyma and vascularity. CT CHEST FINDINGS Cardiovascular: No cardiomegaly or pericardial effusion.No aortic aneurysm. Mediastinum/Nodes: No mediastinal mass. No mediastinal, hilar, or axillary lymphadenopathy. Lungs/Pleura: The midline trachea and bronchi are patent. Biapical pleuroparenchymal scarring. No focal airspace consolidation, pleural effusion, or pneumothorax. Posterior bibasilar dependent atelectasis. CT ABDOMEN PELVIS FINDINGS Hepatobiliary: No mass.No  radiopaque stones or wall thickening of the gallbladder.No intrahepatic or extrahepatic biliary ductal dilation. Pancreas: No mass or main ductal dilation.No peripancreatic inflammation or fluid collection. Spleen: Normal size. No mass. Adrenals/Urinary Tract: No adrenal masses. No renal mass. No hydronephrosis or nephrolithiasis. The urinary bladder is distended without focal abnormality. Stomach/Bowel: The stomach is decompressed without focal abnormality. No small bowel wall thickening or inflammation. No small bowel obstruction.The appendix was not visualized. No right lower quadrant  or pericecal inflammatory changes to suggest acute appendicitis. Scattered colonic diverticulosis. Vascular/Lymphatic: No aortic aneurysm. Diffuse aortoiliac atherosclerosis. No intraabdominal or pelvic lymphadenopathy. Reproductive: Age-related atrophy of the uterus and ovaries. No concerning adnexal mass. No free pelvic fluid. Other: No pneumoperitoneum, ascites, or mesenteric inflammation. Musculoskeletal: No acute fracture or destructive lesion. Osteopenia. IMPRESSION: 1. No acute abnormality within the chest, abdomen, or pelvis. 2. Scattered colonic diverticulosis. No changes of acute diverticulitis. Electronically Signed   By: Rance Burrows M.D.   On: 03/03/2024 17:08   CT Angio Head Neck W WO CM (CODE STROKE) Result Date: 03/03/2024 CLINICAL DATA:  Provided history: Neuro deficit, acute, stroke suspected. Abnormal speech. Increased weakness. EXAM: CT ANGIOGRAPHY HEAD AND NECK WITH AND WITHOUT CONTRAST TECHNIQUE: Multidetector CT imaging of the head and neck was performed using the standard protocol during bolus administration of intravenous contrast. Multiplanar CT image reconstructions and MIPs were obtained to evaluate the vascular anatomy. Carotid stenosis measurements (when applicable) are obtained utilizing NASCET criteria, using the distal internal carotid diameter as the denominator. RADIATION DOSE REDUCTION: This  exam was performed according to the departmental dose-optimization program which includes automated exposure control, adjustment of the mA and/or kV according to patient size and/or use of iterative reconstruction technique. CONTRAST:  75mL OMNIPAQUE  IOHEXOL  350 MG/ML SOLN COMPARISON:  Non-contrast head CT performed earlier today 03/03/2024. CT angiogram head/neck 07/26/2022. FINDINGS: CTA NECK FINDINGS Aortic arch: The visualized thoracic aorta is normal in caliber. Atherosclerotic plaque within the proximal major branch vessels of the neck. The right subclavian artery origin is excluded from the field of view. Within this limitation, no hemodynamically significant innominate or proximal subclavian artery stenosis is identified. Right carotid system: CCA and ICA patent within the neck. Atherosclerotic plaque within the mid CCA with up to 50% stenosis relative to the normal caliber CCA, progressed from the prior CTA. Soft plaque within the proximal ICA without hemodynamically significant stenosis (50% or greater). Left carotid system: The common carotid artery origin is incompletely included in the field of view. Within this limitation, the CCA and ICA are patent within the neck without measurable stenosis. Mild atherosclerotic plaque within the proximal ICA. Ulcerated plaque within the carotid bulb (for instance as seen on series 8, image 128). Vertebral arteries: Patent within the neck without stenosis or significant atherosclerotic disease. The right vertebral artery is dominant. Skeleton: The patient is edentulous. Cervical spondylosis. Developmental C2-C3 non segmentation. Cervical spondylosis. No acute fracture or aggressive osseous lesion. Other neck: Multiple thyroid  nodules, the largest within the right lobe measuring 16 mm. Upper chest: No consolidation within the imaged lung apices. Review of the MIP images confirms the above findings CTA HEAD FINDINGS Anterior circulation: The intracranial internal  carotid arteries are patent. Nonstenotic atherosclerotic plaque within both vessels. Severe stenosis within the right middle cerebral artery distal M1 segment, progressed from the prior CTA of 07/26/2022 (series 9, image 22) (series 10, image 18). No M2 proximal branch occlusion or high-grade proximal stenosis. The anterior cerebral arteries are patent. Mildly hypoplastic left A1 segment. No intracranial aneurysm is identified. Posterior circulation: The intracranial vertebral arteries are patent. The basilar artery is patent. The posterior cerebral arteries are patent. Atherosclerotic irregularity of both vessels. Most notably, there is a severe stenosis within the right PCA at the P3/P4 junction (series 11, image 13) and there are sites of moderate stenosis and there is up to moderate stenosis within the left PCA at the P3/P4 junction. Venous sinuses: Within the limitations of contrast timing, no convincing thrombus.  Anatomic variants: As described. Review of the MIP images confirms the above findings No emergent large vessel occlusion identified. These results were communicated to Dr. Janett Medin at 2:16 pmon 4/22/2025by text page via the [facility] messaging system. IMPRESSION: CTA neck: 1. The common carotid and internal carotid arteries are patent within the neck without hemodynamically significant stenosis (50% or greater). Atherosclerotic plaque bilaterally, as described. Ulcerated plaque again demonstrated within the left carotid bulb. 2. The vertebral arteries are patent within the neck without stenosis or significant atherosclerotic disease. 3. Multiple thyroid nodules, the largest within the right lobe measuring 16 mm. A non-emergent thyroid ultrasound is recommended for further evaluation. Reference: J Am Coll Radiol. 2015 Feb;12(2): 143-50. CTYA head: 1. No proximal intracranial large vessel occlusion identified. 2. Intracranial atherosclerotic disease with multifocal stenoses, most notably as follows. 3. Severe  stenosis within the right middle cerebral artery distal M1 segment, progressed from the prior CTA of 07/26/2022. 4. Severe stenosis of the right posterior cerebral artery at the P3/P4 junction. 5. Up to moderate stenosis of the left posterior cerebral artery at the P3/P4 junction. Electronically Signed   By: Bascom Lily D.O.   On: 03/03/2024 14:17   CT HEAD CODE STROKE WO CONTRAST Result Date: 03/03/2024 CLINICAL DATA:  Code stroke. Neuro deficit, acute, stroke suspected. Abnormal speech. Increased weakness. EXAM: CT HEAD WITHOUT CONTRAST TECHNIQUE: Contiguous axial images were obtained from the base of the skull through the vertex without intravenous contrast. RADIATION DOSE REDUCTION: This exam was performed according to the departmental dose-optimization program which includes automated exposure control, adjustment of the mA and/or kV according to patient size and/or use of iterative reconstruction technique. COMPARISON:  Brain MRI 12/01/2022. FINDINGS: Brain: Generalized cerebral and cerebellar atrophy. 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 Background mild patchy and ill-defined hypoattenuation within the cerebral white matter, nonspecific but compatible chronic small vessel ischemic disease. There is no acute intracranial hemorrhage. No demarcated cortical infarct. No extra-axial fluid collection. No evidence of an intracranial mass. No midline shift. Vascular: No hyperdense vessel.  Atherosclerotic calcifications. Skull: No calvarial fracture or aggressive osseous lesion. Sinuses/Orbits: No mass or acute finding within the imaged orbits. No significant paranasal sinus disease at the imaged levels. ASPECTS [facility] Stroke Program Early CT Score) - Ganglionic level infarction (caudate, lentiform nuclei, internal capsule, insula, M1-M3 cortex): 7 - Supraganglionic infarction (M4-M6 cortex): 3 Total score (0-10 with  10 being normal): 10 (when discounting the chronic infarcts described above). No evidence of an acute intracranial abnormality. These results were communicated to Dr. Janett Medin at 1:00 pmon 4/22/2025by text page via the [facility] messaging system. IMPRESSION: 1.  No evidence of an acute intracranial abnormality. 2. Parenchymal atrophy, chronic small vessel ischemic disease and chronic infarcts, as described. Electronically Signed   By: Bascom Lily D.O.   On: 03/03/2024 13:01     Subjective: Pt tolerating diet, no complaints, pt agreeable to SNF this morning during rounds  Discharge Exam: Vitals:   03/05/24 0341 03/05/24 0945  BP: 114/83 134/65  Pulse: (!) 102 97  Resp:  17  Temp: 98 F (36.7 C) 98.9 F (37.2 C)  SpO2: 95% 95%   Vitals:   03/04/24 2003 03/05/24 0058 03/05/24 0341 03/05/24 0945  BP: 113/82 (!) 144/97 114/83 134/65  Pulse: 100 (!) 103 (!) 102 97  Resp:    17  Temp: 98.3 F (36.8 C) 97.9 F (36.6 C) 98 F (36.7 C) 98.9 F (37.2 C)  TempSrc: Oral Oral Oral Oral  SpO2: 96% 96% 95% 95%  Weight:      Height:       General: Pt is alert, awake, not in acute distress Cardiovascular: RRR, S1/S2 +, no rubs, no gallops Respiratory: CTA bilaterally, no wheezing, no rhonchi Abdominal: Soft, NT, ND, bowel sounds + Extremities: no edema, no cyanosis   The results of significant diagnostics from this hospitalization (including imaging, microbiology, ancillary and laboratory) are listed below for reference.     Microbiology: Recent Results (from the past 240 hours)  Resp panel by RT-PCR (RSV, Flu A&B, Covid) Anterior Nasal Swab     Status: None   Collection Time: 03/03/24  3:45 PM   Specimen: Anterior Nasal Swab  Result Value Ref Range Status   SARS Coronavirus 2 by RT PCR NEGATIVE NEGATIVE Final    Comment: (NOTE) SARS-CoV-2 target nucleic acids are NOT DETECTED.  The SARS-CoV-2 RNA is generally detectable in upper respiratory specimens during the acute phase of infection.  The lowest concentration of SARS-CoV-2 viral copies this assay can detect is 138 copies/mL. A negative result does not preclude SARS-Cov-2 infection and should not be used as the sole basis for treatment or other patient management decisions. A negative result may occur with  improper specimen collection/handling, submission of specimen other than nasopharyngeal swab, presence of viral mutation(s) within the areas targeted by this assay, and inadequate number of viral copies(<138 copies/mL). A negative result must be combined with clinical observations, patient history, and epidemiological information. The expected result is Negative.  Fact Sheet for Patients:  BloggerCourse.com  Fact Sheet for Healthcare Providers:  SeriousBroker.it  This test is no t yet approved or cleared by the [place]  FDA and  has been authorized for detection and/or diagnosis of SARS-CoV-2 by FDA under an Emergency Use Authorization (EUA). This EUA will remain  in effect (meaning this test can be used) for the duration of the COVID-19 declaration under Section 564(b)(1) of the Act, 21 U.S.C.section 360bbb-3(b)(1), unless the authorization is terminated  or revoked sooner.       Influenza A by PCR NEGATIVE NEGATIVE Final   Influenza B by PCR NEGATIVE NEGATIVE Final    Comment: (NOTE) The Xpert Xpress SARS-CoV-2/FLU/RSV plus assay is intended as an aid in the diagnosis of influenza from Nasopharyngeal swab specimens and should not be used as a sole basis for treatment. Nasal washings and aspirates are unacceptable for Xpert Xpress SARS-CoV-2/FLU/RSV testing.  Fact Sheet for Patients: BloggerCourse.com  Fact Sheet for Healthcare Providers: SeriousBroker.it  This test is not yet approved or cleared by the [place]  FDA and has been authorized for detection and/or diagnosis of SARS-CoV-2 by FDA  under an Emergency Use Authorization (EUA). This EUA will remain in effect (meaning this test can be used) for the duration of the COVID-19 declaration under Section 564(b)(1) of the Act, 21 U.S.C. section 360bbb-3(b)(1), unless the authorization is terminated or revoked.     Resp Syncytial Virus by PCR NEGATIVE NEGATIVE Final    Comment: (NOTE) Fact Sheet for Patients: BloggerCourse.com  Fact Sheet for Healthcare Providers: SeriousBroker.it  This test is not yet approved or cleared by the [place]  FDA and has been authorized for detection and/or diagnosis of SARS-CoV-2 by FDA under an Emergency Use Authorization (EUA). This EUA will remain in effect (meaning this test can be used) for the duration of the COVID-19 declaration under Section 564(b)(1) of the Act, 21 U.S.C. section 360bbb-3(b)(1), unless the authorization is terminated or revoked.  Performed at [facility], [address], [place], [place] [address]      Labs: BNP (last 3 results) No results for input(s): "BNP" in the last 8760 hours. Basic Metabolic Panel: Recent Labs  Lab 03/03/24 1245 03/03/24 1259 03/04/24 0440 03/05/24 0446  NA 145 145 142 141  K 4.2 4.2 4.0 3.6  CL 109 111 110 105  CO2 26  --  22 25  GLUCOSE 85 81 73 95  BUN 21 20 18 14   CREATININE 1.29* 1.50* 1.31* 1.15*  CALCIUM  9.6  --  9.3 9.3  MG  --   --  2.3 1.9   Liver Function Tests: Recent Labs  Lab 03/03/24 1245  AST 21  ALT 27  ALKPHOS 100  BILITOT 0.7  PROT 6.8  ALBUMIN 3.7   No results for input(s): "LIPASE", "AMYLASE" in the last 168 hours. Recent Labs  Lab 03/03/24 1601  AMMONIA 13   CBC: Recent Labs  Lab 03/03/24 1245 03/03/24 1259 03/04/24 0440  WBC 10.9*  --  10.0  NEUTROABS 5.9  --   --   HGB 14.6 15.3* 14.1  HCT 45.9 45.0 44.1  MCV 96.6  --  96.3  PLT 265  --  251   Cardiac Enzymes: Recent Labs  Lab 03/03/24 1601  CKTOTAL 44   BNP: Invalid  input(s): "POCBNP" CBG: No results for input(s): "GLUCAP" in the last 168 hours. D-Dimer No results for input(s): "DDIMER" in the last 72 hours. Hgb A1c Recent Labs    03/03/24 1601  HGBA1C 5.5   Lipid Profile Recent Labs    03/04/24 0440  CHOL 78  HDL 36*  LDLCALC 29  TRIG 63  CHOLHDL 2.2   Thyroid function studies Recent Labs    03/03/24 1245  TSH 0.326*   Anemia work up Recent Labs    03/03/24 1245  VITAMINB12 827  FOLATE 17.6   Urinalysis    Component Value Date/Time   COLORURINE YELLOW 03/03/2024 1758   APPEARANCEUR CLEAR 03/03/2024 1758   LABSPEC 1.045 (H) 03/03/2024 1758   PHURINE 7.0 03/03/2024 1758   GLUCOSEU NEGATIVE 03/03/2024 1758   HGBUR NEGATIVE 03/03/2024 1758   BILIRUBINUR NEGATIVE 03/03/2024 1758   BILIRUBINUR small 06/15/2014 1038   KETONESUR NEGATIVE 03/03/2024 1758   PROTEINUR NEGATIVE 03/03/2024 1758   UROBILINOGEN 0.2 06/15/2014 1038   UROBILINOGEN 1.0 05/04/2013 1637   NITRITE NEGATIVE 03/03/2024 1758   LEUKOCYTESUR NEGATIVE 03/03/2024 1758   Sepsis Labs Recent Labs  Lab 03/03/24 1245 03/04/24 0440  WBC 10.9* 10.0   Microbiology Recent Results (from the past 240 hours)  Resp panel by RT-PCR (RSV, Flu A&B, Covid) Anterior Nasal Swab     Status: None   Collection Time: 03/03/24  3:45 PM   Specimen: Anterior Nasal Swab  Result Value Ref Range Status   SARS Coronavirus 2 by RT PCR NEGATIVE NEGATIVE Final    Comment: (NOTE) SARS-CoV-2 target nucleic acids are NOT DETECTED.  The SARS-CoV-2 RNA is generally detectable in upper respiratory specimens during the acute phase of infection. The lowest concentration of SARS-CoV-2 viral copies this assay can detect is 138 copies/mL. A negative result does not preclude SARS-Cov-2 infection and should not be used as the sole basis for treatment or other patient management decisions. A negative result may occur with  improper specimen collection/handling, submission of specimen  other than nasopharyngeal swab, presence of viral mutation(s) within the areas targeted by this assay, and inadequate number of viral copies(<138 copies/mL). A negative result  must be combined with clinical observations, patient history, and epidemiological information. The expected result is Negative.  Fact Sheet for Patients:  BloggerCourse.com  Fact Sheet for Healthcare Providers:  SeriousBroker.it  This test is no t yet approved or cleared by the [place]  FDA and  has been authorized for detection and/or diagnosis of SARS-CoV-2 by FDA under an Emergency Use Authorization (EUA). This EUA will remain  in effect (meaning this test can be used) for the duration of the COVID-19 declaration under Section 564(b)(1) of the Act, 21 U.S.C.section 360bbb-3(b)(1), unless the authorization is terminated  or revoked sooner.       Influenza A by PCR NEGATIVE NEGATIVE Final   Influenza B by PCR NEGATIVE NEGATIVE Final    Comment: (NOTE) The Xpert Xpress SARS-CoV-2/FLU/RSV plus assay is intended as an aid in the diagnosis of influenza from Nasopharyngeal swab specimens and should not be used as a sole basis for treatment. Nasal washings and aspirates are unacceptable for Xpert Xpress SARS-CoV-2/FLU/RSV testing.  Fact Sheet for Patients: BloggerCourse.com  Fact Sheet for Healthcare Providers: SeriousBroker.it  This test is not yet approved or cleared by the [place]  FDA and has been authorized for detection and/or diagnosis of SARS-CoV-2 by FDA under an Emergency Use Authorization (EUA). This EUA will remain in effect (meaning this test can be used) for the duration of the COVID-19 declaration under Section 564(b)(1) of the Act, 21 U.S.C. section 360bbb-3(b)(1), unless the authorization is terminated or revoked.     Resp Syncytial Virus by PCR NEGATIVE NEGATIVE Final     Comment: (NOTE) Fact Sheet for Patients: BloggerCourse.com  Fact Sheet for Healthcare Providers: SeriousBroker.it  This test is not yet approved or cleared by the [place]  FDA and has been authorized for detection and/or diagnosis of SARS-CoV-2 by FDA under an Emergency Use Authorization (EUA). This EUA will remain in effect (meaning this test can be used) for the duration of the COVID-19 declaration under Section 564(b)(1) of the Act, 21 U.S.C. section 360bbb-3(b)(1), unless the authorization is terminated or revoked.  Performed at [facility], [address], [place], [place] [address]     Time coordinating discharge:  55 mins  SIGNED:  Faustino Hook, MD  [facility] 03/05/2024, 1:31 PM How to contact the [facility] Attending or Consulting provider 7A - 7P or covering provider during after hours 7P -7A, for this patient?  Check the care team in [facility] and look for a) attending/consulting [facility] provider listed and b) the [facility] team listed Log into [email or web address] and use [place]'s universal password to access. If you do not have the password, please contact the hospital operator. Locate the [facility] provider you are looking for under [facility] and page to a number that you can be directly reached. If you still have difficulty reaching the provider, please page the [facility] (Director on Call) for the Hospitalists listed on amion for assistance.

## 2024-03-11 ENCOUNTER — Telehealth: Payer: Self-pay | Admitting: Neurology

## 2024-03-11 ENCOUNTER — Ambulatory Visit: Admitting: Neurology

## 2024-03-11 NOTE — Telephone Encounter (Signed)
 Pt's sister has called to inquire about the reason as to why today's appointment was cancelled and another date rescheduled.

## 2024-03-18 ENCOUNTER — Ambulatory Visit (INDEPENDENT_AMBULATORY_CARE_PROVIDER_SITE_OTHER): Admitting: Neurology

## 2024-03-18 VITALS — BP 143/84

## 2024-03-18 DIAGNOSIS — I69354 Hemiplegia and hemiparesis following cerebral infarction affecting left non-dominant side: Secondary | ICD-10-CM | POA: Diagnosis not present

## 2024-03-18 DIAGNOSIS — R471 Dysarthria and anarthria: Secondary | ICD-10-CM

## 2024-03-18 MED ORDER — INCOBOTULINUMTOXINA 100 UNITS IM SOLR: 500.0000 [IU] | INTRAMUSCULAR | Status: AC

## 2024-03-18 NOTE — Progress Notes (Signed)
 xeomin 100units x 5 vial  [id number] [id number] B/B  Bacteriostatic 0.9% Sodium Chloride - 10mL  WUJ:[id number] Expiration: 09/12/24 NDC: [id number] Dx: [id number]  WITNESSED ON:GEXBM RN

## 2024-03-18 NOTE — Progress Notes (Signed)
 Chief Complaint  Patient presents with   Injections    Rm14, alonePt is well and ready for injection      ASSESSMENT AND PLAN  Kara Bailey is a 68 y.o. 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  No antigravity movement of left upper, lower extremity, pain of left shoulder, and finger, limited range of motion with passive stretch,  Prior authorization of xeomin 500 units, return to clinic for electrical stimulation guided injection  This is her first electrical stimulation guided guided Xeomin injection, she has skin breakdown at the left arm, Use 500 units of xeomin Left brachialis 100 units Left biceps 100 units Left brachioradialis 100 units  Left adductor longus 100 units Left adductor magnus 50 units Left semitendenous 50 units   DIAGNOSTIC DATA (LABS, IMAGING, TESTING) - I reviewed patient records, labs, notes, testing and imaging myself where available.   MEDICAL HISTORY:  Kara Bailey is a 68 year old female, seen in request by her primary care doctor Kara Bailey, Kara Bailey  for evaluation of spastic left hemiparesis, botulism toxin injection, she is accompanied by her facility driver at today's visit February 18, 2024  History is obtained from the patient and review of electronic medical records. I personally reviewed pertinent available imaging films in PACS.   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, had a G-tube placement on December 03, 2021 History of esophageal stricture, dilated in 2020,  She had a history of left thalamic stroke, was on aspirin  and Plavix , presenting with worsening left facial droop, dysarthria, left-sided weakness on July 26, 2022, MRI of the brain demonstrated acute stroke at the right corona radiata and posterior lentiform, since stroke, she had a significant residual spastic left hemiparesis,  She lost the use of her left arm, significant left finger pain, shoulder pain with passive  stretch, no antigravity movement of left lower extremity, needing help transfer  CT angiogram of head and neck showed no large vessel disease  She later developed significant dysarthria, dysphagia required transient PEG tube placement December 03, 2022, and was discharged to nursing home  Echocardiogram in September 2023 ejection fraction more than 75%, hyperdynamic function of left ventricle, no regional wall motion abnormality  UPDATE May 7th 2025: She lives at nursing home, brought in by transportation today, has left arm in wrap due to skin break down, this is her first electrical stimulation guided injections for spastic left hemiparesis, injection sites are limited to left upper shoulder,      PHYSICAL EXAM:   There were no vitals filed for this visit.  There is no height or weight on file to calculate BMI.  PHYSICAL EXAMNIATION:  Gen: NAD, conversant, well nourised, well groomed                     Cardiovascular: Regular rate rhythm, no peripheral edema, warm, nontender. Eyes: Conjunctivae clear without exudates or hemorrhage Neck: Supple, no carotid bruits. Pulmonary: Clear to auscultation bilaterally   NEUROLOGICAL EXAM:  MENTAL STATUS: Speech/cognition: Dysarthria, awake, alert, oriented to history taking and casual conversation CRANIAL NERVES: CN II: Visual fields are full to confrontation. Pupils are round equal and briskly reactive to light. CN III, IV, VI: extraocular movement are normal. No ptosis. CN V: Facial sensation is intact to light touch CN VII: Face is symmetric with normal eye closure  CN VIII: Hearing is normal to causal conversation. CN IX, X: Phonation is normal. CN XI: Head turning and  shoulder shrug are intact  MOTOR: Spastic left hemiparesis, no antigravity movement of left upper or lower extremity, left hand form in finger flexion, with passive stretch, finger can reach full range of motion, tight left shoulder, complains of pain with passive  stretch, preserved range of motion of left elbow, tendency for left ankle plantarflexion  REFLEXES: Hyperreflexia of left upper and lower extremity  SENSORY: Intact to light touch, pinprick and vibratory sensation are intact in fingers and toes.  COORDINATION: There is no trunk or limb dysmetria noted.  GAIT/STANCE: Deferred  REVIEW OF SYSTEMS:  Full 14 system review of systems performed and notable only for as above All other review of systems were negative.   ALLERGIES: No Known Allergies  HOME MEDICATIONS: Current Outpatient Medications  Medication Sig Dispense Refill   acetaminophen  (TYLENOL ) 325 MG tablet Take 2 tablets (650 mg total) by mouth every 6 (six) hours as needed for mild pain (pain score 1-3), fever, headache or moderate pain (pain score 4-6).     albuterol  (VENTOLIN  HFA) 108 (90 Base) MCG/ACT inhaler Inhale 2 puffs into the lungs every 6 (six) hours as needed for wheezing or shortness of breath.     aspirin  EC 81 MG tablet Take 81 mg by mouth daily. Swallow whole.     atorvastatin  (LIPITOR ) 80 MG tablet Take 1 tablet (80 mg total) by mouth every evening.     busPIRone  (BUSPAR ) 5 MG tablet Take 1 tablet (5 mg total) by mouth 3 (three) times daily.     Calcium  Carb-Cholecalciferol (CALCIUM  1000 + D) 1000-20 MG-MCG TABS Take 1 tablet by mouth daily.     chlorhexidine  (HIBICLENS ) 4 % external liquid Apply 1 Application topically 2 (two) times a week. Clean open lesions with Chlorhexidine  soap, then apply Gentian Violet to lesions, apply Vaseline, wrap with Kerlix and Coban twice weekly/PRN every day shift every Tues and Fri for wound treatment     clopidogrel  (PLAVIX ) 75 MG tablet Take 1 tablet (75 mg total) by mouth daily. 90 tablet 0   ezetimibe  (ZETIA ) 10 MG tablet Take 10 mg by mouth daily.     fluticasone (FLONASE) 50 MCG/ACT nasal spray Place 2 sprays into both nostrils daily.     gentian violet 1 % topical solution Apply 0.5 mLs topically 2 (two) times a week.  Apply to open wounds every Tues and Fri and then cover with dry covered dressing wrap with ACE wrap and secure with tape     hydroxypropyl methylcellulose / hypromellose (ISOPTO TEARS / GONIOVISC) 2.5 % ophthalmic solution Place 1 drop into both eyes in the morning and at bedtime.     mycophenolate  (CELLCEPT ) 500 MG tablet Take 1,000 mg by mouth daily.     ondansetron  (ZOFRAN -ODT) 4 MG disintegrating tablet Take 4 mg by mouth every 8 (eight) hours as needed for nausea or vomiting.     pantoprazole  (PROTONIX ) 40 MG tablet Take 1 tablet (40 mg total) by mouth daily.     polyethylene glycol (MIRALAX  / GLYCOLAX ) 17 g packet Take 17 g by mouth daily as needed. (Patient taking differently: Take 17 g by mouth 2 (two) times daily.) 14 each 0   thiamine  (VITAMIN B-1) 100 MG tablet Take 100 mg by mouth daily.     Vilazodone  HCl (VIIBRYD ) 10 MG TABS Take 10 mg by mouth daily.     No current facility-administered medications for this visit.    PAST MEDICAL HISTORY: Past Medical History:  Diagnosis Date   Acute CVA (cerebrovascular  accident) (HCC) 12/29/2020   Anxiety    Chest pain 08/2014   unspecified   Cirrhosis (HCC)    CVA (cerebral vascular accident) (HCC) 07/27/2022   Depression    Dizziness 08/2014   Dysphagia following cerebral infarction    Dyspnea 08/2014   Dysrhythmia    Emphysema lung (HCC)    GERD (gastroesophageal reflux disease)    Heart murmur    Hemiplegia, unspecified affecting left nondominant side (HCC)    Hepatitis C    Hiatal hernia    Hyperlipidemia    Hypertension    Insomnia    Ischemic stroke (HCC) 08/01/2022   Muscle weakness (generalized)    Other muscle spasm    PAC (premature atrial contraction) 10/11/2014   PAD (peripheral artery disease) (HCC)    Palpitations    PVC (premature ventricular contraction) 10/11/2014   PVD (peripheral vascular disease) (HCC)    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   Stroke (HCC)    just slurred speech    Tachycardia 08/2014   Ventral hernia    Weakness 08/2014    PAST SURGICAL HISTORY: Past Surgical History:  Procedure Laterality Date   ABDOMINAL AORTOGRAM W/LOWER EXTREMITY N/A 07/13/2021   Procedure: ABDOMINAL AORTOGRAM W/LOWER EXTREMITY;  Surgeon: Avanell Leigh, MD;  Location: [facility];  Service: Cardiovascular;  Laterality: N/A;   ABDOMINAL AORTOGRAM W/LOWER EXTREMITY N/A 03/19/2022   Procedure: ABDOMINAL AORTOGRAM W/LOWER EXTREMITY;  Surgeon: Avanell Leigh, MD;  Location: [facility];  Service: Cardiovascular;  Laterality: N/A;   APPENDECTOMY     CATARACT EXTRACTION, BILATERAL     COLONOSCOPY     COLONOSCOPY WITH PROPOFOL  N/A 04/30/2016   Procedure: COLONOSCOPY WITH PROPOFOL ;  Surgeon: Suzette Espy, MD;  Location: [facility];  Service: Endoscopy;  Laterality: N/A;  1230   EPIGASTRIC HERNIA REPAIR N/A 10/13/2021   Procedure: OPEN REPAIR EPIGASTRIC HERNIA WITH MESH PATCH;  Surgeon: Oralee Billow, MD;  Location: [facility];  Service: General;  Laterality: N/A;   ESOPHAGOGASTRODUODENOSCOPY     approximately 2010   ESOPHAGOGASTRODUODENOSCOPY (EGD) WITH PROPOFOL  N/A 04/30/2016   Procedure: ESOPHAGOGASTRODUODENOSCOPY (EGD) WITH PROPOFOL ;  Surgeon: Suzette Espy, MD;  Location: [facility];  Service: Endoscopy;  Laterality: N/A;   ESOPHAGOGASTRODUODENOSCOPY (EGD) WITH PROPOFOL  N/A 12/28/2022   Procedure: ESOPHAGOGASTRODUODENOSCOPY (EGD) WITH PROPOFOL ;  Surgeon: Kenney Peacemaker, MD;  Location: [facility];  Service: Gastroenterology;  Laterality: N/A;   IR GASTROSTOMY TUBE MOD SED  12/03/2022   IR GASTROSTOMY TUBE REMOVAL  09/12/2023   LEFT HEART CATHETERIZATION WITH CORONARY ANGIOGRAM N/A 10/29/2014   07-14-20- pt denies this Procedure: LEFT HEART CATHETERIZATION WITH CORONARY ANGIOGRAM;  Surgeon: Odie Benne, MD;  Location: [facility];  Service: Cardiovascular;  Laterality: N/A;   MALONEY DILATION N/A 04/30/2016   Procedure: Londa Rival DILATION;  Surgeon: Suzette Espy, MD;  Location: [facility];  Service: Endoscopy;  Laterality: N/A;   PERIPHERAL VASCULAR INTERVENTION  07/13/2021   Procedure: PERIPHERAL VASCULAR INTERVENTION;  Surgeon: Avanell Leigh, MD;  Location: [facility];  Service: Cardiovascular;;  left SFA left external iliac   PERIPHERAL VASCULAR INTERVENTION Right 03/19/2022   Procedure: PERIPHERAL VASCULAR INTERVENTION;  Surgeon: Avanell Leigh, MD;  Location: [facility];  Service: Cardiovascular;  Laterality: Right;  SFA   POLYPECTOMY  04/30/2016   Procedure: POLYPECTOMY;  Surgeon: Suzette Espy, MD;  Location: [facility];  Service: Endoscopy;;  Sigmoid colon polyp removed via hot snare  UPPER GASTROINTESTINAL ENDOSCOPY      FAMILY HISTORY: Family History  Problem Relation Age of Onset   Heart failure Mother    Heart attack Mother    Heart disease Mother    Hypertension Mother    Hyperlipidemia Mother    CVA Father    Alzheimer's disease Father    Cancer Neg Hx    Colon cancer Neg Hx    Esophageal cancer Neg Hx    Rectal cancer Neg Hx    Stomach cancer Neg Hx     SOCIAL HISTORY: Social History   Socioeconomic History   Marital status: Divorced    Spouse name: Not on file   Number of children: 2   Years of education: Not on file   Highest education level: Not on file  Occupational History   Occupation: retired  Tobacco Use   Smoking status: Former    Current packs/day: 0.50    Average packs/day: 0.5 packs/day for 55.3 years (27.7 ttl pk-yrs)    Types: Cigarettes    Start date: 11/12/1968   Smokeless tobacco: Never   Tobacco comments:    cutting back, wearing patches  Vaping Use   Vaping status: Never Used  Substance and Sexual Activity   Alcohol use: Not Currently    Comment: beer on weekends (6-pack)   Drug use: No   Sexual activity: Not Currently  Other Topics Concern   Not on file  Social History Narrative   Not on file   Social Drivers of Health   Financial Resource Strain:  Not on file  Food Insecurity: Patient Declined (03/03/2024)   Hunger Vital Sign    Worried About Running Out of Food in the Last Year: Patient declined    Ran Out of Food in the Last Year: Patient declined  Transportation Needs: Patient Unable To Answer (03/03/2024)   PRAPARE - Transportation    Lack of Transportation (Medical): Patient unable to answer    Lack of Transportation (Non-Medical): Patient unable to answer  Physical Activity: Not on file  Stress: Not on file  Social Connections: Patient Unable To Answer (03/03/2024)   Social Connection and Isolation Panel [NHANES]    Frequency of Communication with Friends and Family: Patient unable to answer    Frequency of Social Gatherings with Friends and Family: Patient unable to answer    Attends Religious Services: Patient unable to answer    Active Member of Clubs or Organizations: Patient unable to answer    Attends Banker Meetings: Patient unable to answer    Marital Status: Patient unable to answer  Intimate Partner Violence: Not At Risk (03/03/2024)   Humiliation, Afraid, Rape, and Kick questionnaire    Fear of Current or Ex-Partner: No    Emotionally Abused: No    Physically Abused: No    Sexually Abused: No      Phebe Brasil, M.D. Ph.D.  [facility] [address], [address] [place], [place] [address] Ph: [phone] Fax: [phone]  CC:  Forestine Igo, DO [address] [place],  [place] [address]  Forestine Igo, DO

## 2024-03-31 ENCOUNTER — Encounter (HOSPITAL_COMMUNITY): Payer: Self-pay | Admitting: *Deleted

## 2024-03-31 ENCOUNTER — Observation Stay (HOSPITAL_COMMUNITY)
Admission: EM | Admit: 2024-03-31 | Discharge: 2024-04-01 | Disposition: A | Discharge status: Other Acute Inpatient Hospital | Attending: Family Medicine | Admitting: Family Medicine

## 2024-03-31 ENCOUNTER — Emergency Department (HOSPITAL_COMMUNITY)

## 2024-03-31 ENCOUNTER — Other Ambulatory Visit: Payer: Self-pay

## 2024-03-31 DIAGNOSIS — R531 Weakness: Secondary | ICD-10-CM | POA: Diagnosis present

## 2024-03-31 DIAGNOSIS — I69354 Hemiplegia and hemiparesis following cerebral infarction affecting left non-dominant side: Secondary | ICD-10-CM | POA: Diagnosis not present

## 2024-03-31 DIAGNOSIS — R160 Hepatomegaly, not elsewhere classified: Secondary | ICD-10-CM | POA: Diagnosis not present

## 2024-03-31 DIAGNOSIS — N390 Urinary tract infection, site not specified: Secondary | ICD-10-CM | POA: Diagnosis not present

## 2024-03-31 DIAGNOSIS — N3000 Acute cystitis without hematuria: Secondary | ICD-10-CM | POA: Diagnosis not present

## 2024-03-31 DIAGNOSIS — Z87891 Personal history of nicotine dependence: Secondary | ICD-10-CM | POA: Diagnosis not present

## 2024-03-31 DIAGNOSIS — Z79624 Long term (current) use of inhibitors of nucleotide synthesis: Secondary | ICD-10-CM | POA: Diagnosis not present

## 2024-03-31 DIAGNOSIS — I10 Essential (primary) hypertension: Secondary | ICD-10-CM | POA: Diagnosis not present

## 2024-03-31 DIAGNOSIS — Z8619 Personal history of other infectious and parasitic diseases: Secondary | ICD-10-CM | POA: Insufficient documentation

## 2024-03-31 DIAGNOSIS — M62830 Muscle spasm of back: Secondary | ICD-10-CM | POA: Diagnosis not present

## 2024-03-31 DIAGNOSIS — Z7902 Long term (current) use of antithrombotics/antiplatelets: Secondary | ICD-10-CM | POA: Insufficient documentation

## 2024-03-31 DIAGNOSIS — L120 Bullous pemphigoid: Secondary | ICD-10-CM | POA: Diagnosis not present

## 2024-03-31 DIAGNOSIS — R1310 Dysphagia, unspecified: Secondary | ICD-10-CM | POA: Insufficient documentation

## 2024-03-31 DIAGNOSIS — F411 Generalized anxiety disorder: Secondary | ICD-10-CM | POA: Insufficient documentation

## 2024-03-31 DIAGNOSIS — R490 Dysphonia: Secondary | ICD-10-CM | POA: Insufficient documentation

## 2024-03-31 DIAGNOSIS — Z79899 Other long term (current) drug therapy: Secondary | ICD-10-CM | POA: Diagnosis not present

## 2024-03-31 DIAGNOSIS — Z8673 Personal history of transient ischemic attack (TIA), and cerebral infarction without residual deficits: Secondary | ICD-10-CM | POA: Insufficient documentation

## 2024-03-31 DIAGNOSIS — Z7982 Long term (current) use of aspirin: Secondary | ICD-10-CM | POA: Diagnosis not present

## 2024-03-31 LAB — COMPREHENSIVE METABOLIC PANEL WITH GFR
ALT: 11 U/L (ref 0–44)
AST: 16 U/L (ref 15–41)
Albumin: 3.5 g/dL (ref 3.5–5.0)
Alkaline Phosphatase: 81 U/L (ref 38–126)
Anion gap: 9 (ref 5–15)
BUN: 10 mg/dL (ref 8–23)
CO2: 26 mmol/L (ref 22–32)
Calcium: 10.1 mg/dL (ref 8.9–10.3)
Chloride: 104 mmol/L (ref 98–111)
Creatinine, Ser: 0.84 mg/dL (ref 0.44–1.00)
GFR, Estimated: >60 mL/min (ref >60)
Glucose, Bld: 89 mg/dL (ref 70–99)
Potassium: 3.2 mmol/L — ABNORMAL LOW (ref 3.5–5.1)
Sodium: 139 mmol/L (ref 135–145)
Total Bilirubin: 0.7 mg/dL (ref 0.0–1.2)
Total Protein: 6.9 g/dL (ref 6.5–8.1)

## 2024-03-31 LAB — URINALYSIS, W/ REFLEX TO CULTURE (INFECTION SUSPECTED)
Bilirubin Urine: NEGATIVE
Glucose, UA: NEGATIVE mg/dL
Ketones, ur: NEGATIVE mg/dL
Nitrite: POSITIVE — AB
Specific Gravity, Urine: 1.02 (ref 1.005–1.030)
WBC, UA: >50 WBC/hpf (ref 0–5)
pH: 6 (ref 5.0–8.0)

## 2024-03-31 LAB — CBC WITH DIFFERENTIAL/PLATELET
Abs Immature Granulocytes: 0.04 10*3/uL (ref 0.00–0.07)
Basophils Absolute: 0 10*3/uL (ref 0.0–0.1)
Basophils Relative: 0 %
Eosinophils Absolute: 0.6 10*3/uL — ABNORMAL HIGH (ref 0.0–0.5)
Eosinophils Relative: 5 %
HCT: 41.8 % (ref 36.0–46.0)
Hemoglobin: 13.1 g/dL (ref 12.0–15.0)
Immature Granulocytes: 0 %
Lymphocytes Relative: 16 %
Lymphs Abs: 1.9 10*3/uL (ref 0.7–4.0)
MCH: 30.3 pg (ref 26.0–34.0)
MCHC: 31.3 g/dL (ref 30.0–36.0)
MCV: 96.5 fL (ref 80.0–100.0)
Monocytes Absolute: 1.2 10*3/uL — ABNORMAL HIGH (ref 0.1–1.0)
Monocytes Relative: 10 %
Neutro Abs: 8 10*3/uL — ABNORMAL HIGH (ref 1.7–7.7)
Neutrophils Relative %: 69 %
Platelets: 330 10*3/uL (ref 150–400)
RBC: 4.33 MIL/uL (ref 3.87–5.11)
RDW: 13.4 % (ref 11.5–15.5)
WBC: 11.6 10*3/uL — ABNORMAL HIGH (ref 4.0–10.5)
nRBC: 0 % (ref 0.0–0.2)

## 2024-03-31 LAB — LIPASE, BLOOD: Lipase: 33 U/L (ref 11–51)

## 2024-03-31 LAB — CBG MONITORING, ED: Glucose-Capillary: 87 mg/dL (ref 70–99)

## 2024-03-31 MED ORDER — HYPROMELLOSE (GONIOSCOPIC) 2.5 % OP SOLN: 1.0000 [drp] | Freq: Three times a day (TID) | OPHTHALMIC | Status: DC | PRN

## 2024-03-31 MED ORDER — ENOXAPARIN SODIUM 40 MG/0.4ML IJ SOSY
40.0000 mg | PREFILLED_SYRINGE | INTRAMUSCULAR | Status: DC
  Administered 2024-04-01: 40 mg via SUBCUTANEOUS
  Filled 2024-03-31: qty 0.4

## 2024-03-31 MED ORDER — ACETAMINOPHEN 650 MG RE SUPP: 650.0000 mg | Freq: Four times a day (QID) | RECTAL | Status: DC | PRN

## 2024-03-31 MED ORDER — BUSPIRONE HCL 5 MG PO TABS
5.0000 mg | ORAL_TABLET | Freq: Three times a day (TID) | ORAL | Status: DC
Start: 2024-03-31 — End: 2024-04-01
  Administered 2024-03-31 – 2024-04-01 (×2): 5 mg via ORAL
  Filled 2024-03-31 (×2): qty 1

## 2024-03-31 MED ORDER — ACETAMINOPHEN 325 MG PO TABS: 650.0000 mg | ORAL_TABLET | Freq: Four times a day (QID) | ORAL | Status: DC | PRN

## 2024-03-31 MED ORDER — ALBUTEROL SULFATE (2.5 MG/3ML) 0.083% IN NEBU: 2.5000 mg | INHALATION_SOLUTION | Freq: Four times a day (QID) | RESPIRATORY_TRACT | Status: DC | PRN

## 2024-03-31 MED ORDER — VITAMIN D 25 MCG (1000 UNIT) PO TABS
1000.0000 [IU] | ORAL_TABLET | Freq: Every day | ORAL | Status: DC
  Administered 2024-04-01: 1000 [IU] via ORAL
  Filled 2024-03-31: qty 1

## 2024-03-31 MED ORDER — CLOPIDOGREL BISULFATE 75 MG PO TABS
75.0000 mg | ORAL_TABLET | Freq: Every day | ORAL | Status: DC
  Administered 2024-04-01: 75 mg via ORAL
  Filled 2024-03-31: qty 1

## 2024-03-31 MED ORDER — ASPIRIN 81 MG PO TBEC
81.0000 mg | DELAYED_RELEASE_TABLET | Freq: Every day | ORAL | Status: DC
  Administered 2024-04-01: 81 mg via ORAL
  Filled 2024-03-31: qty 1

## 2024-03-31 MED ORDER — SODIUM CHLORIDE 0.9 % IV SOLN
1.0000 g | INTRAVENOUS | Status: DC
  Administered 2024-04-01: 1 g via INTRAVENOUS
  Filled 2024-03-31: qty 10

## 2024-03-31 MED ORDER — SODIUM CHLORIDE 0.9 % IV SOLN
1.0000 g | Freq: Once | INTRAVENOUS | Status: AC
  Administered 2024-03-31: 1 g via INTRAVENOUS
  Filled 2024-03-31: qty 10

## 2024-03-31 MED ORDER — ALPRAZOLAM 0.25 MG PO TABS
0.2500 mg | ORAL_TABLET | Freq: Three times a day (TID) | ORAL | Status: DC
  Administered 2024-03-31 – 2024-04-01 (×2): 0.25 mg via ORAL
  Filled 2024-03-31 (×2): qty 1

## 2024-03-31 MED ORDER — SODIUM CHLORIDE 0.9% FLUSH
3.0000 mL | Freq: Two times a day (BID) | INTRAVENOUS | Status: DC
  Administered 2024-03-31 – 2024-04-01 (×2): 3 mL via INTRAVENOUS

## 2024-03-31 MED ORDER — MYCOPHENOLATE MOFETIL 250 MG PO CAPS
750.0000 mg | ORAL_CAPSULE | Freq: Every day | ORAL | Status: DC
  Administered 2024-04-01: 750 mg via ORAL
  Filled 2024-03-31 (×2): qty 3

## 2024-03-31 MED ORDER — CALCIUM CARBONATE 1250 (500 CA) MG PO TABS
1000.0000 mg | ORAL_TABLET | Freq: Every day | ORAL | Status: DC
  Administered 2024-04-01: 2500 mg via ORAL
  Filled 2024-03-31: qty 2

## 2024-03-31 MED ORDER — CALCIUM CARB-CHOLECALCIFEROL 1000-20 MG-MCG PO TABS: 1.0000 | ORAL_TABLET | Freq: Every day | ORAL | Status: DC

## 2024-03-31 MED ORDER — POLYETHYLENE GLYCOL 3350 17 G PO PACK: 17.0000 g | PACK | Freq: Every day | ORAL | Status: DC | PRN

## 2024-03-31 MED ORDER — ACETAMINOPHEN 500 MG PO TABS
1000.0000 mg | ORAL_TABLET | Freq: Once | ORAL | Status: DC
  Filled 2024-03-31: qty 2

## 2024-03-31 MED ORDER — SODIUM CHLORIDE 0.9 % IV BOLUS
1000.0000 mL | Freq: Once | INTRAVENOUS | Status: AC
  Administered 2024-03-31: 1000 mL via INTRAVENOUS

## 2024-03-31 MED ORDER — EZETIMIBE 10 MG PO TABS
10.0000 mg | ORAL_TABLET | Freq: Every day | ORAL | Status: DC
  Administered 2024-04-01: 10 mg via ORAL
  Filled 2024-03-31: qty 1

## 2024-03-31 MED ORDER — ATORVASTATIN CALCIUM 40 MG PO TABS
80.0000 mg | ORAL_TABLET | Freq: Every evening | ORAL | Status: DC
  Administered 2024-03-31: 80 mg via ORAL
  Filled 2024-03-31: qty 2

## 2024-03-31 MED ORDER — HYDROMORPHONE HCL 1 MG/ML IJ SOLN
0.5000 mg | Freq: Once | INTRAMUSCULAR | Status: AC
  Administered 2024-03-31: 0.5 mg via INTRAVENOUS
  Filled 2024-03-31: qty 0.5

## 2024-03-31 MED ORDER — POTASSIUM CHLORIDE 20 MEQ PO PACK
40.0000 meq | PACK | Freq: Once | ORAL | Status: DC
  Filled 2024-03-31: qty 2

## 2024-03-31 MED ORDER — THIAMINE MONONITRATE 100 MG PO TABS
100.0000 mg | ORAL_TABLET | Freq: Every day | ORAL | Status: DC
  Administered 2024-04-01: 100 mg via ORAL
  Filled 2024-03-31: qty 1

## 2024-03-31 MED ORDER — IOHEXOL 300 MG/ML  SOLN
100.0000 mL | Freq: Once | INTRAMUSCULAR | Status: AC | PRN
  Administered 2024-03-31: 100 mL via INTRAVENOUS

## 2024-03-31 MED ORDER — MIRTAZAPINE 15 MG PO TABS
7.5000 mg | ORAL_TABLET | Freq: Every day | ORAL | Status: DC
  Administered 2024-03-31: 7.5 mg via ORAL
  Filled 2024-03-31: qty 1

## 2024-03-31 MED ORDER — ONDANSETRON 4 MG PO TBDP: 4.0000 mg | ORAL_TABLET | Freq: Three times a day (TID) | ORAL | Status: DC | PRN

## 2024-03-31 MED ORDER — OXYCODONE HCL 5 MG PO TABS
5.0000 mg | ORAL_TABLET | ORAL | Status: DC | PRN
  Administered 2024-03-31 – 2024-04-01 (×2): 5 mg via ORAL
  Filled 2024-03-31 (×2): qty 1

## 2024-03-31 NOTE — Assessment & Plan Note (Signed)
 A/w leukocytosis and sinus tachy. S.p 1 liter NS. No acidosis. BP good. Will give antoher liter. Urine culture penidng. C.w. ceftriaxone .

## 2024-03-31 NOTE — ED Notes (Signed)
Pt to floor via stretcher in stable condition.

## 2024-03-31 NOTE — Assessment & Plan Note (Addendum)
 Or fatigue X 3 days. Check Tsh and troponin. Likely due to UTI.  Reduce Xanax  to 0.25 mg 3 times daily.  No fill history of Xanax  and prescription drug monitoring program

## 2024-03-31 NOTE — ED Notes (Signed)
 Edp aware pt got choked drinking the water  and unable to give tylenol 

## 2024-03-31 NOTE — H&P (Signed)
 History and Physical    Patient: Kara Bailey:[id number] DOB: November 02, 1956 DOA: 03/31/2024 DOS: the patient was seen and examined on 03/31/2024 PCP: Forestine Igo, DO  Patient coming from: SNF  Chief Complaint:  Chief Complaint  Patient presents with   Weakness   HPI: Kara Bailey is a 68 y.o. female with medical history significant of prior stroke complicated by left hemiplegia chronic and dysphona. Other PMH as below.Kara Bailey is resident of SNF and reports increasing fatigue X 3 days.   Her fatigue is generalized in nature she does not report new weakness no loss of consciousness no chest pain no shortness of breath no rigors or chills or fever no diarrhea no vomiting no dysuria no rash on her skin no trauma.  Patient came to the ER due to above complaints, patient noted to have urinary tract infection on investigation in the ER.  Patient has been started on ceftriaxone , medical evaluation is sought.   Review of Systems: As mentioned in the history of present illness. All other systems reviewed and are negative. Past Medical History:  Diagnosis Date   Acute CVA (cerebrovascular accident) (HCC) 12/29/2020   Anxiety    Chest pain 08/2014   unspecified   Cirrhosis (HCC)    CVA (cerebral vascular accident) (HCC) 07/27/2022   Depression    Dizziness 08/2014   Dysphagia following cerebral infarction    Dyspnea 08/2014   Dysrhythmia    Emphysema lung (HCC)    GERD (gastroesophageal reflux disease)    Heart murmur    Hemiplegia, unspecified affecting left nondominant side (HCC)    Hepatitis C    Hiatal hernia    Hyperlipidemia    Hypertension    Insomnia    Ischemic stroke (HCC) 08/01/2022   Muscle weakness (generalized)    Other muscle spasm    PAC (premature atrial contraction) 10/11/2014   PAD (peripheral artery disease) (HCC)    Palpitations    PVC (premature ventricular contraction) 10/11/2014   PVD (peripheral vascular disease) (HCC)    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   Stroke (HCC)    just slurred speech   Tachycardia 08/2014   Ventral hernia    Weakness 08/2014   Past Surgical History:  Procedure Laterality Date   ABDOMINAL AORTOGRAM W/LOWER EXTREMITY N/A 07/13/2021   Procedure: ABDOMINAL AORTOGRAM W/LOWER EXTREMITY;  Surgeon: Avanell Leigh, MD;  Location: [facility];  Service: Cardiovascular;  Laterality: N/A;   ABDOMINAL AORTOGRAM W/LOWER EXTREMITY N/A 03/19/2022   Procedure: ABDOMINAL AORTOGRAM W/LOWER EXTREMITY;  Surgeon: Avanell Leigh, MD;  Location: [facility];  Service: Cardiovascular;  Laterality: N/A;   APPENDECTOMY     CATARACT EXTRACTION, BILATERAL     COLONOSCOPY     COLONOSCOPY WITH PROPOFOL  N/A 04/30/2016   Procedure: COLONOSCOPY WITH PROPOFOL ;  Surgeon: Suzette Espy, MD;  Location: [facility];  Service: Endoscopy;  Laterality: N/A;  1230   EPIGASTRIC HERNIA REPAIR N/A 10/13/2021   Procedure: OPEN REPAIR EPIGASTRIC HERNIA WITH MESH PATCH;  Surgeon: Oralee Billow, MD;  Location: [facility];  Service: General;  Laterality: N/A;   ESOPHAGOGASTRODUODENOSCOPY     approximately 2010   ESOPHAGOGASTRODUODENOSCOPY (EGD) WITH PROPOFOL  N/A 04/30/2016   Procedure: ESOPHAGOGASTRODUODENOSCOPY (EGD) WITH PROPOFOL ;  Surgeon: Suzette Espy, MD;  Location: [facility];  Service: Endoscopy;  Laterality: N/A;   ESOPHAGOGASTRODUODENOSCOPY (EGD) WITH PROPOFOL  N/A 12/28/2022   Procedure: ESOPHAGOGASTRODUODENOSCOPY (EGD) WITH PROPOFOL ;  Surgeon: Kenney Peacemaker, MD;  Location: [facility];  Service: Gastroenterology;  Laterality: N/A;   IR GASTROSTOMY TUBE MOD SED  12/03/2022   IR GASTROSTOMY TUBE REMOVAL  09/12/2023   LEFT HEART CATHETERIZATION WITH CORONARY ANGIOGRAM N/A 10/29/2014   07-14-20- pt denies this Procedure: LEFT HEART CATHETERIZATION WITH CORONARY ANGIOGRAM;  Surgeon: Odie Benne, MD;  Location: [facility];  Service: Cardiovascular;  Laterality: N/A;   MALONEY DILATION N/A 04/30/2016    Procedure: Kara Bailey DILATION;  Surgeon: Suzette Espy, MD;  Location: [facility];  Service: Endoscopy;  Laterality: N/A;   PERIPHERAL VASCULAR INTERVENTION  07/13/2021   Procedure: PERIPHERAL VASCULAR INTERVENTION;  Surgeon: Avanell Leigh, MD;  Location: [facility];  Service: Cardiovascular;;  left SFA left external iliac   PERIPHERAL VASCULAR INTERVENTION Right 03/19/2022   Procedure: PERIPHERAL VASCULAR INTERVENTION;  Surgeon: Avanell Leigh, MD;  Location: [facility];  Service: Cardiovascular;  Laterality: Right;  SFA   POLYPECTOMY  04/30/2016   Procedure: POLYPECTOMY;  Surgeon: Suzette Espy, MD;  Location: [facility];  Service: Endoscopy;;  Sigmoid colon polyp removed via hot snare   UPPER GASTROINTESTINAL ENDOSCOPY     Social History:  reports that she has quit smoking. Her smoking use included cigarettes. She started smoking about 55 years ago. She has a 27.7 pack-year smoking history. She has never used smokeless tobacco. She reports that she does not currently use alcohol. She reports that she does not use drugs.  No Known Allergies  Family History  Problem Relation Age of Onset   Heart failure Mother    Heart attack Mother    Heart disease Mother    Hypertension Mother    Hyperlipidemia Mother    CVA Father    Alzheimer's disease Father    Cancer Neg Hx    Colon cancer Neg Hx    Esophageal cancer Neg Hx    Rectal cancer Neg Hx    Stomach cancer Neg Hx     Prior to Admission medications   Medication Sig Start Date End Date Taking? Authorizing Provider  acetaminophen  (TYLENOL ) 325 MG tablet Take 2 tablets (650 mg total) by mouth every 6 (six) hours as needed for mild pain (pain score 1-3), fever, headache or moderate pain (pain score 4-6). 03/05/24  Yes Johnson, Clanford L, MD  albuterol  (VENTOLIN  HFA) 108 (90 Base) MCG/ACT inhaler Inhale 2 puffs into the lungs every 6 (six) hours as needed for wheezing or shortness of breath.   Yes [provider]  ALPRAZolam  (XANAX ) 0.5 MG tablet Take 0.5 mg by mouth 3 (three) times daily.   Yes [provider]  aspirin  EC 81 MG tablet Take 81 mg by mouth daily. Swallow whole.   Yes [provider]  atorvastatin  (LIPITOR ) 80 MG tablet Take 1 tablet (80 mg total) by mouth every evening. 03/05/24  Yes Johnson, Clanford L, MD  busPIRone  (BUSPAR ) 5 MG tablet Take 1 tablet (5 mg total) by mouth 3 (three) times daily. 03/05/24  Yes Johnson, Clanford L, MD  Calcium  Carb-Cholecalciferol (CALCIUM  1000 + D) 1000-20 MG-MCG TABS Take 1 tablet by mouth daily. 05/02/23  Yes [provider]  clopidogrel  (PLAVIX ) 75 MG tablet Take 1 tablet (75 mg total) by mouth daily. 12/20/22  Yes Avanell Leigh, MD  ezetimibe  (ZETIA ) 10 MG tablet Take 10 mg by mouth daily.   Yes [provider]  hydroxypropyl methylcellulose / hypromellose (ISOPTO TEARS / GONIOVISC) 2.5 % ophthalmic solution Place 1 drop into both eyes  in the morning and at bedtime.   Yes [provider]  mirtazapine  (REMERON ) 7.5 MG tablet Take 7.5 mg by mouth at bedtime.   Yes [provider]  mycophenolate  (CELLCEPT ) 500 MG tablet Take 1,000 mg by mouth daily.   Yes [provider]  Nutritional Supplement LIQD Take 120 mLs by mouth daily. Listed as House Supplement in [facility]   Yes [provider]  ondansetron  (ZOFRAN -ODT) 4 MG disintegrating tablet Take 4 mg by mouth every 8 (eight) hours as needed for nausea or vomiting.   Yes [provider]  oxycodone  (OXY-IR) 5 MG capsule Take 5 mg by mouth every 4 (four) hours as needed (moderate to severe pain).  04/03/24 Yes [provider]  polyethylene glycol (MIRALAX  / GLYCOLAX ) 17 g packet Take 17 g by mouth daily as needed. Patient taking differently: Take 17 g by mouth as needed for mild constipation. 09/06/22  Yes Setzer, Hamp Levine, PA-C  thiamine  (VITAMIN B-1) 100 MG tablet Take 100 mg by mouth daily.   Yes [provider]    Physical Exam: Vitals:   03/31/24 1703 03/31/24 1730 03/31/24 1800 03/31/24 1830  BP:  139/77 (!) 148/72 (!) 159/91  Pulse:  (!) 101 (!) 101 (!) 106  Resp:  16 20 (!) 23  Temp: 98.2 F (36.8 C)     TempSrc: Oral     SpO2:  94% 97% 96%   General: Patient is alert and awake, appears to be in no distress.  Patient interacts, although her voice is dysphonic which makes it a little bit difficult to understand.  Patient seems to give a reasonably coherent account of her symptoms between what she told the ER provider and what she told me Respiratory exam: Bilateral air entry vesicular Cardiovascular exam S1-S2 normal Abdomen all quadrants soft nontender thin lady Extremities warm without edema Left hemiplegia is appreciated. With left hand contractures. Data Reviewed:  Labs on Admission:  Results for orders placed or performed during the hospital encounter of 03/31/24 (from the past 24 hours)  CBG monitoring, ED     Status: None   Collection Time: 03/31/24  1:54 PM  Result Value Ref Range   Glucose-Capillary 87 70 - 99 mg/dL  Comprehensive metabolic panel     Status: Abnormal   Collection Time: 03/31/24  2:22 PM  Result Value Ref Range   Sodium 139 135 - 145 mmol/L   Potassium 3.2 (L) 3.5 - 5.1 mmol/L   Chloride 104 98 - 111 mmol/L   CO2 26 22 - 32 mmol/L   Glucose, Bld 89 70 - 99 mg/dL   BUN 10 8 - 23 mg/dL   Creatinine, Ser 1.61 0.44 - 1.00 mg/dL   Calcium  10.1 8.9 - 10.3 mg/dL   Total Protein 6.9 6.5 - 8.1 g/dL   Albumin 3.5 3.5 - 5.0 g/dL   AST 16 15 - 41 U/L   ALT 11 0 - 44 U/L   Alkaline Phosphatase 81 38 - 126 U/L   Total Bilirubin 0.7 0.0 - 1.2 mg/dL   GFR, Estimated >09 >60 mL/min   Anion gap 9 5 - 15  CBC with Differential     Status: Abnormal   Collection Time: 03/31/24  2:22 PM  Result Value Ref Range   WBC 11.6 (H) 4.0 - 10.5 K/uL   RBC 4.33 3.87 - 5.11 MIL/uL   Hemoglobin 13.1 12.0 - 15.0 g/dL   HCT 45.4 09.8 - 11.9 %   MCV 96.5 80.0 -  100.0 fL   MCH 30.3 26.0 - 34.0 pg   MCHC 31.3 30.0 - 36.0 g/dL   RDW 16.1 09.6 - 04.5 %   Platelets 330 150 - 400 K/uL   nRBC 0.0 0.0 - 0.2 %   Neutrophils Relative % 69 %   Neutro Abs 8.0 (H) 1.7 - 7.7 K/uL   Lymphocytes Relative 16 %   Lymphs Abs 1.9 0.7 - 4.0 K/uL   Monocytes Relative 10 %   Monocytes Absolute 1.2 (H) 0.1 - 1.0 K/uL   Eosinophils Relative 5 %   Eosinophils Absolute 0.6 (H) 0.0 - 0.5 K/uL   Basophils Relative 0 %   Basophils Absolute 0.0 0.0 - 0.1 K/uL   Immature Granulocytes 0 %   Abs Immature Granulocytes 0.04 0.00 - 0.07 K/uL  Lipase, blood     Status: None   Collection Time: 03/31/24  2:22 PM  Result Value Ref Range   Lipase 33 11 - 51 U/L  Urinalysis, w/ Reflex to Culture (Infection Suspected) -Urine, Clean Catch     Status: Abnormal   Collection Time: 03/31/24  2:55 PM  Result Value Ref Range   Specimen Source URINE, CLEAN CATCH    Color, Urine YELLOW YELLOW   APPearance HAZY (A) CLEAR   Specific Gravity, Urine 1.020 1.005 - 1.030   pH 6.0 5.0 - 8.0   Glucose, UA NEGATIVE NEGATIVE mg/dL   Hgb urine dipstick SMALL (A) NEGATIVE   Bilirubin Urine NEGATIVE NEGATIVE   Ketones, ur NEGATIVE NEGATIVE mg/dL   Protein, ur TRACE (A) NEGATIVE mg/dL   Nitrite POSITIVE (A) NEGATIVE   Leukocytes,Ua SMALL (A) NEGATIVE   Squamous Epithelial / HPF 0-5 0 - 5 /HPF   WBC, UA >50 0 - 5 WBC/hpf   RBC / HPF 0-5 0 - 5 RBC/hpf   Bacteria, UA MANY (A) NONE SEEN   Basic Metabolic Panel: Recent Labs  Lab 03/31/24 1422  NA 139  K 3.2*  CL 104  CO2 26  GLUCOSE 89  BUN 10  CREATININE 0.84  CALCIUM  10.1   Liver Function Tests: Recent Labs  Lab 03/31/24 1422  AST 16  ALT 11  ALKPHOS 81  BILITOT 0.7  PROT 6.9  ALBUMIN 3.5   Recent Labs  Lab 03/31/24 1422  LIPASE 33   No results for input(s): "AMMONIA" in the last 168 hours. CBC: Recent Labs  Lab 03/31/24 1422  WBC 11.6*  NEUTROABS 8.0*  HGB 13.1  HCT 41.8  MCV 96.5  PLT 330   Cardiac  Enzymes: No results for input(s): "CKTOTAL", "CKMB", "CKMBINDEX", "TROPONINIHS" in the last 168 hours.  BNP (last 3 results) No results for input(s): "PROBNP" in the last 8760 hours. CBG: Recent Labs  Lab 03/31/24 1354  GLUCAP 87    Radiological Exams on Admission:  CT Head Wo Contrast Result Date: 03/31/2024 CLINICAL DATA:  Weakness and slurred speech. EXAM: CT HEAD WITHOUT CONTRAST TECHNIQUE: Contiguous axial images were obtained from the base of the skull through the vertex without intravenous contrast. RADIATION DOSE REDUCTION: This exam was performed according to the departmental dose-optimization program which includes automated exposure control, adjustment of the mA and/or kV according to patient size and/or use of iterative reconstruction technique. COMPARISON:  March 03, 2024 FINDINGS: Brain: There is generalized cerebral atrophy with widening of the extra-axial spaces and ventricular dilatation. There are areas of decreased attenuation within the white matter tracts of the supratentorial brain, consistent with microvascular disease changes. Chronic bilateral basal ganglia lacunar infarcts  are noted. Vascular: No hyperdense vessel or unexpected calcification. Skull: Normal. Negative for fracture or focal lesion. Sinuses/Orbits: No acute finding. Other: None. IMPRESSION: 1. Generalized cerebral atrophy and microvascular disease changes of the supratentorial brain. 2. Chronic bilateral basal ganglia lacunar infarcts. 3. No acute intracranial abnormality. Electronically Signed   By: Virgle Grime M.D.   On: 03/31/2024 17:17   CT ABDOMEN PELVIS W CONTRAST Result Date: 03/31/2024 CLINICAL DATA:  Left lower quadrant abdominal pain. EXAM: CT ABDOMEN AND PELVIS WITH CONTRAST TECHNIQUE: Multidetector CT imaging of the abdomen and pelvis was performed using the standard protocol following bolus administration of intravenous contrast. RADIATION DOSE REDUCTION: This exam was performed according to  the departmental dose-optimization program which includes automated exposure control, adjustment of the mA and/or kV according to patient size and/or use of iterative reconstruction technique. CONTRAST:  OMNIPAQUE  IOHEXOL  300 MG/ML  SOLN COMPARISON:  CT dated 03/03/2024. FINDINGS: Evaluation of this exam is limited due to respiratory motion. Lower chest: The visualized lung bases are clear. No intra-abdominal free air or free fluid. Hepatobiliary: Indeterminate 14 x 11 mm enhancing focus in the left lobe of the liver not characterized on this CT. Further characterization with MRI without and with contrast on a nonemergent/outpatient basis recommended. No biliary dilatation. No calcified gallstone or pericholecystic fluid. Pancreas: Unremarkable. No pancreatic ductal dilatation or surrounding inflammatory changes. Spleen: Normal in size without focal abnormality. Adrenals/Urinary Tract: The adrenal glands are unremarkable. Punctate nonobstructing right renal interpolar calculus. There is no hydronephrosis or obstructing stone. The left kidney is unremarkable. The visualized ureters and urinary bladder appear unremarkable. Stomach/Bowel: There is no bowel obstruction or active inflammation. The appendix is not visualized with certainty. No inflammatory changes identified in the right lower quadrant. Vascular/Lymphatic: Moderate aortoiliac atherosclerotic disease. The IVC is unremarkable. No portal venous gas. There is no adenopathy. Reproductive: The uterus is grossly unremarkable. No suspicious adnexal masses. Other: None Musculoskeletal: Osteopenia with degenerative changes spine. No acute osseous pathology. IMPRESSION: 1. No acute intra-abdominal or pelvic pathology. 2. Punctate nonobstructing right renal interpolar calculus. No hydronephrosis or obstructing stone. 3. Indeterminate 14 x 11 mm enhancing focus in the left lobe of the liver. Further characterization with MRI without and with contrast on a  nonemergent/outpatient basis recommended. 4.  Aortic Atherosclerosis (ICD10-I70.0). Electronically Signed   By: Angus Bark M.D.   On: 03/31/2024 16:59   DG Chest Portable 1 View Result Date: 03/31/2024 CLINICAL DATA:  Weakness. EXAM: PORTABLE CHEST 1 VIEW COMPARISON:  11/29/2022. FINDINGS: Bilateral lung fields are clear. Bilateral costophrenic angles are clear. Normal cardio-mediastinal silhouette. No acute osseous abnormalities. The soft tissues are within normal limits. IMPRESSION: *No active disease. Electronically Signed   By: Beula Brunswick M.D.   On: 03/31/2024 14:36    chest X-ray  EKG: Independently reviewed. Sinus tachy.  No intake/output data recorded. No intake/output data recorded.      Assessment and Plan: * UTI (urinary tract infection) A/w leukocytosis and sinus tachy. S.p 1 liter NS. No acidosis. BP good. Will give antoher liter. Urine culture penidng. C.w. ceftriaxone .  History of stroke Continue with aspirin  Plavix  Lipitor  and Zetia .  Patient has chronic left hemiplegia  Bullous pemphigoid Currently in remission. Labs as above. Patinet has been on a 1000 mg of mycophenolate  since April 28 at least. Taper by 20 % as disease seems inactive.  Liver mass Prior known hep C and cirhosis. Will order mri liver, AFP  Generalized weakness Or fatigue X 3 days. Check Tsh and  troponin. Likely due to UTI.  Reduce Xanax  to 0.25 mg 3 times daily.  No fill history of Xanax  and prescription drug monitoring program  Dysphagia Patient reports regular diet at home.  Generalized anxiety disorder With BuSpar  and Remeron   Back muscle spasm C.w. oxycodone . No fill history in PDMP  DVT ppx - lovenox .    Advance Care Planning:   Code Status: Full Code   Consults: none at this time.  Family Communication: per patient.  Severity of Illness: The appropriate patient status for this patient is OBSERVATION. Observation status is judged to be reasonable and necessary in  order to provide the required intensity of service to ensure the patient's safety. The patient's presenting symptoms, physical exam findings, and initial radiographic and laboratory data in the context of their medical condition is felt to place them at decreased risk for further clinical deterioration. Furthermore, it is anticipated that the patient will be medically stable for discharge from the hospital within 2 midnights of admission.   Author: Bennie Brave, MD 03/31/2024 7:41 PM  For on call review [email or web address].

## 2024-03-31 NOTE — Assessment & Plan Note (Signed)
 Patient reports regular diet at home.

## 2024-03-31 NOTE — ED Provider Notes (Signed)
  EMERGENCY DEPARTMENT AT [facility] Provider Note  CSN: [id number] Arrival date & time: 03/31/24 1323  Chief Complaint(s) Weakness  HPI Kara Bailey is a 68 y.o. female history of prior stroke with residual left-sided hemiparesis, hypertension, hyperlipidemia presenting to the emergency department with weakness.  Patient reports that she just feels weak.  She reports that she has had some intermittent abdominal pain.  Has not felt any specific urinary symptoms, cough, shortness of breath, fevers, chest pain.  Per EMS, patient seemed to be slightly weak last Thursday, apparently there were new nurses on over the weekend, nurse who last saw her on Thursday was there again today and felt the patient seemed to be doing worse.  She was sent to the emergency department for further evaluation.   Past Medical History Past Medical History:  Diagnosis Date   Acute CVA (cerebrovascular accident) (HCC) 12/29/2020   Anxiety    Chest pain 08/2014   unspecified   Cirrhosis (HCC)    CVA (cerebral vascular accident) (HCC) 07/27/2022   Depression    Dizziness 08/2014   Dysphagia following cerebral infarction    Dyspnea 08/2014   Dysrhythmia    Emphysema lung (HCC)    GERD (gastroesophageal reflux disease)    Heart murmur    Hemiplegia, unspecified affecting left nondominant side (HCC)    Hepatitis C    Hiatal hernia    Hyperlipidemia    Hypertension    Insomnia    Ischemic stroke (HCC) 08/01/2022   Muscle weakness (generalized)    Other muscle spasm    PAC (premature atrial contraction) 10/11/2014   PAD (peripheral artery disease) (HCC)    Palpitations    PVC (premature ventricular contraction) 10/11/2014   PVD (peripheral vascular disease) (HCC)    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   Stroke (HCC)    just slurred speech   Tachycardia 08/2014   Ventral hernia    Weakness 08/2014   Patient Active Problem List   Diagnosis Date Noted   UTI  (urinary tract infection) 03/31/2024   Acute metabolic encephalopathy 03/03/2024   Generalized weakness 03/03/2024   Hemiparesis affecting left side as late effect of cerebrovascular accident (HCC) 02/18/2024   Dysarthria 02/18/2024   Decreased hemoglobin 12/28/2022   Heme positive stool 12/28/2022   Protein-calorie malnutrition, severe 12/04/2022   History of stroke 11/29/2022   Dysphagia as late effect of cerebrovascular accident (CVA) 11/29/2022   Stroke (HCC) 07/26/2022   Left hemiplegia (HCC) 07/26/2022   Peripheral arterial disease (HCC) 12/20/2020   History of esophageal stricture 07/08/2020   Insomnia 12/02/2017   Chronic cough 08/14/2017   Poor appetite 08/29/2016   Peptic stricture of esophagus    History of colonic polyps    Hiatal hernia 03/16/2016   Dysphagia 03/16/2016   Alcohol abuse 10/26/2015   Essential hypertension 09/26/2015   PAC (premature atrial contraction) 10/11/2014   PVC (premature ventricular contraction) 10/11/2014   Back muscle spasm 06/15/2014   Generalized anxiety disorder 06/15/2014   Smoking 06/15/2014   Chronic hepatitis C without hepatic coma (HCC) 05/28/2013   Home Medication(s) Prior to Admission medications   Medication Sig Start Date End Date Taking? Authorizing Provider  acetaminophen  (TYLENOL ) 325 MG tablet Take 2 tablets (650 mg total) by mouth every 6 (six) hours as needed for mild pain (pain score 1-3), fever, headache or moderate pain (pain score 4-6). 03/05/24  Yes Johnson, Clanford L, MD  albuterol  (VENTOLIN  HFA) 108 (90 Base) MCG/ACT inhaler Inhale 2 puffs  into the lungs every 6 (six) hours as needed for wheezing or shortness of breath.   Yes [provider]  ALPRAZolam  (XANAX ) 0.5 MG tablet Take 0.5 mg by mouth 3 (three) times daily.   Yes [provider]  aspirin  EC 81 MG tablet Take 81 mg by mouth daily. Swallow whole.   Yes [provider]  atorvastatin  (LIPITOR ) 80 MG tablet Take 1 tablet (80 mg  total) by mouth every evening. 03/05/24  Yes Johnson, Clanford L, MD  busPIRone  (BUSPAR ) 5 MG tablet Take 1 tablet (5 mg total) by mouth 3 (three) times daily. 03/05/24  Yes Johnson, Clanford L, MD  Calcium  Carb-Cholecalciferol (CALCIUM  1000 + D) 1000-20 MG-MCG TABS Take 1 tablet by mouth daily. 05/02/23  Yes [provider]  clopidogrel  (PLAVIX ) 75 MG tablet Take 1 tablet (75 mg total) by mouth daily. 12/20/22  Yes Avanell Leigh, MD  ezetimibe  (ZETIA ) 10 MG tablet Take 10 mg by mouth daily.   Yes [provider]  hydroxypropyl methylcellulose / hypromellose (ISOPTO TEARS / GONIOVISC) 2.5 % ophthalmic solution Place 1 drop into both eyes in the morning and at bedtime.   Yes [provider]  mirtazapine  (REMERON ) 7.5 MG tablet Take 7.5 mg by mouth at bedtime.   Yes [provider]  mycophenolate  (CELLCEPT ) 500 MG tablet Take 1,000 mg by mouth daily.   Yes [provider]  Nutritional Supplement LIQD Take 120 mLs by mouth daily. Listed as House Supplement in [facility]   Yes [provider]  ondansetron  (ZOFRAN -ODT) 4 MG disintegrating tablet Take 4 mg by mouth every 8 (eight) hours as needed for nausea or vomiting.   Yes [provider]  oxycodone  (OXY-IR) 5 MG capsule Take 5 mg by mouth every 4 (four) hours as needed (moderate to severe pain).  04/03/24 Yes [provider]  polyethylene glycol (MIRALAX  / GLYCOLAX ) 17 g packet Take 17 g by mouth daily as needed. Patient taking differently: Take 17 g by mouth as needed for mild constipation. 09/06/22  Yes Setzer, Hamp Levine, PA-C  thiamine  (VITAMIN B-1) 100 MG tablet Take 100 mg by mouth daily.   Yes [provider]                                                                                                                                    Past Surgical History Past Surgical History:  Procedure Laterality Date   ABDOMINAL AORTOGRAM W/LOWER EXTREMITY N/A 07/13/2021   Procedure:  ABDOMINAL AORTOGRAM W/LOWER EXTREMITY;  Surgeon: Avanell Leigh, MD;  Location: [facility];  Service: Cardiovascular;  Laterality: N/A;   ABDOMINAL AORTOGRAM W/LOWER EXTREMITY N/A 03/19/2022   Procedure: ABDOMINAL AORTOGRAM W/LOWER EXTREMITY;  Surgeon: Avanell Leigh, MD;  Location: [facility];  Service: Cardiovascular;  Laterality: N/A;   APPENDECTOMY     CATARACT EXTRACTION, BILATERAL     COLONOSCOPY  COLONOSCOPY WITH PROPOFOL  N/A 04/30/2016   Procedure: COLONOSCOPY WITH PROPOFOL ;  Surgeon: Suzette Espy, MD;  Location: [facility];  Service: Endoscopy;  Laterality: N/A;  1230   EPIGASTRIC HERNIA REPAIR N/A 10/13/2021   Procedure: OPEN REPAIR EPIGASTRIC HERNIA WITH MESH PATCH;  Surgeon: Oralee Billow, MD;  Location: [facility];  Service: General;  Laterality: N/A;   ESOPHAGOGASTRODUODENOSCOPY     approximately 2010   ESOPHAGOGASTRODUODENOSCOPY (EGD) WITH PROPOFOL  N/A 04/30/2016   Procedure: ESOPHAGOGASTRODUODENOSCOPY (EGD) WITH PROPOFOL ;  Surgeon: Suzette Espy, MD;  Location: [facility];  Service: Endoscopy;  Laterality: N/A;   ESOPHAGOGASTRODUODENOSCOPY (EGD) WITH PROPOFOL  N/A 12/28/2022   Procedure: ESOPHAGOGASTRODUODENOSCOPY (EGD) WITH PROPOFOL ;  Surgeon: Kenney Peacemaker, MD;  Location: [facility];  Service: Gastroenterology;  Laterality: N/A;   IR GASTROSTOMY TUBE MOD SED  12/03/2022   IR GASTROSTOMY TUBE REMOVAL  09/12/2023   LEFT HEART CATHETERIZATION WITH CORONARY ANGIOGRAM N/A 10/29/2014   07-14-20- pt denies this Procedure: LEFT HEART CATHETERIZATION WITH CORONARY ANGIOGRAM;  Surgeon: Odie Benne, MD;  Location: [facility];  Service: Cardiovascular;  Laterality: N/A;   MALONEY DILATION N/A 04/30/2016   Procedure: Londa Rival DILATION;  Surgeon: Suzette Espy, MD;  Location: [facility];  Service: Endoscopy;  Laterality: N/A;   PERIPHERAL VASCULAR INTERVENTION  07/13/2021   Procedure: PERIPHERAL VASCULAR INTERVENTION;  Surgeon: Avanell Leigh, MD;   Location: [facility];  Service: Cardiovascular;;  left SFA left external iliac   PERIPHERAL VASCULAR INTERVENTION Right 03/19/2022   Procedure: PERIPHERAL VASCULAR INTERVENTION;  Surgeon: Avanell Leigh, MD;  Location: [facility];  Service: Cardiovascular;  Laterality: Right;  SFA   POLYPECTOMY  04/30/2016   Procedure: POLYPECTOMY;  Surgeon: Suzette Espy, MD;  Location: [facility];  Service: Endoscopy;;  Sigmoid colon polyp removed via hot snare   UPPER GASTROINTESTINAL ENDOSCOPY     Family History Family History  Problem Relation Age of Onset   Heart failure Mother    Heart attack Mother    Heart disease Mother    Hypertension Mother    Hyperlipidemia Mother    CVA Father    Alzheimer's disease Father    Cancer Neg Hx    Colon cancer Neg Hx    Esophageal cancer Neg Hx    Rectal cancer Neg Hx    Stomach cancer Neg Hx     Social History Social History   Tobacco Use   Smoking status: Former    Current packs/day: 0.50    Average packs/day: 0.5 packs/day for 55.4 years (27.7 ttl pk-yrs)    Types: Cigarettes    Start date: 11/12/1968   Smokeless tobacco: Never   Tobacco comments:    cutting back, wearing patches  Vaping Use   Vaping status: Never Used  Substance Use Topics   Alcohol use: Not Currently    Comment: beer on weekends (6-pack)   Drug use: No   Allergies Patient has no known allergies.  Review of Systems Review of Systems  All other systems reviewed and are negative.   Physical Exam Vital Signs  I have reviewed the triage vital signs BP (!) 148/72   Pulse (!) 101   Temp 98.2 F (36.8 C) (Oral)   Resp 20   SpO2 97%  Physical Exam Vitals and nursing note reviewed.  Constitutional:      General: She is not in acute distress.    Appearance: She is well-developed.     Comments: Chronically  ill-appearing  HENT:     Head: Normocephalic and atraumatic.     Mouth/Throat:     Mouth: Mucous membranes are moist.  Eyes:     Pupils:  Pupils are equal, round, and reactive to light.  Cardiovascular:     Rate and Rhythm: Normal rate and regular rhythm.     Heart sounds: No murmur heard. Pulmonary:     Effort: Pulmonary effort is normal. No respiratory distress.     Breath sounds: Normal breath sounds.  Abdominal:     General: Abdomen is flat.     Palpations: Abdomen is soft.     Tenderness: There is abdominal tenderness (llq).  Musculoskeletal:        General: No tenderness.     Right lower leg: No edema.     Left lower leg: No edema.  Skin:    General: Skin is warm and dry.     Comments: Chronic appearing skin ulceration, left forearm and right upper arm no surrounding erythema or swelling, no purulence  Neurological:     Mental Status: She is alert.     Comments: Oriented x 3, possibly some slurred speech although unclear baseline.  Chronic left-sided hemiparesis, strength 5 out of 5 in the right upper and lower extremity.  Follows commands without difficulty  Psychiatric:        Mood and Affect: Mood normal.        Behavior: Behavior normal.     ED Results and Treatments Labs (all labs ordered are listed, but only abnormal results are displayed) Labs Reviewed  COMPREHENSIVE METABOLIC PANEL WITH GFR - Abnormal; Notable for the following components:      Result Value   Potassium 3.2 (*)    All other components within normal limits  CBC WITH DIFFERENTIAL/PLATELET - Abnormal; Notable for the following components:   WBC 11.6 (*)    Neutro Abs 8.0 (*)    Monocytes Absolute 1.2 (*)    Eosinophils Absolute 0.6 (*)    All other components within normal limits  URINALYSIS, W/ REFLEX TO CULTURE (INFECTION SUSPECTED) - Abnormal; Notable for the following components:   APPearance HAZY (*)    Hgb urine dipstick SMALL (*)    Protein, ur TRACE (*)    Nitrite POSITIVE (*)    Leukocytes,Ua SMALL (*)    Bacteria, UA MANY (*)    All other components within normal limits  URINE CULTURE  LIPASE, BLOOD  APTT   PROTIME-INR  BASIC METABOLIC PANEL WITH GFR  CBC  CBG MONITORING, ED                                                                                                                          Radiology CT Head Wo Contrast Result Date: 03/31/2024 CLINICAL DATA:  Weakness and slurred speech. EXAM: CT HEAD WITHOUT CONTRAST TECHNIQUE: Contiguous axial images were obtained from the base of the skull through the vertex without intravenous contrast. RADIATION DOSE REDUCTION: This exam  was performed according to the departmental dose-optimization program which includes automated exposure control, adjustment of the mA and/or kV according to patient size and/or use of iterative reconstruction technique. COMPARISON:  March 03, 2024 FINDINGS: Brain: There is generalized cerebral atrophy with widening of the extra-axial spaces and ventricular dilatation. There are areas of decreased attenuation within the white matter tracts of the supratentorial brain, consistent with microvascular disease changes. Chronic bilateral basal ganglia lacunar infarcts are noted. Vascular: No hyperdense vessel or unexpected calcification. Skull: Normal. Negative for fracture or focal lesion. Sinuses/Orbits: No acute finding. Other: None. IMPRESSION: 1. Generalized cerebral atrophy and microvascular disease changes of the supratentorial brain. 2. Chronic bilateral basal ganglia lacunar infarcts. 3. No acute intracranial abnormality. Electronically Signed   By: Virgle Grime M.D.   On: 03/31/2024 17:17   CT ABDOMEN PELVIS W CONTRAST Result Date: 03/31/2024 CLINICAL DATA:  Left lower quadrant abdominal pain. EXAM: CT ABDOMEN AND PELVIS WITH CONTRAST TECHNIQUE: Multidetector CT imaging of the abdomen and pelvis was performed using the standard protocol following bolus administration of intravenous contrast. RADIATION DOSE REDUCTION: This exam was performed according to the departmental dose-optimization program which includes automated  exposure control, adjustment of the mA and/or kV according to patient size and/or use of iterative reconstruction technique. CONTRAST:  OMNIPAQUE  IOHEXOL  300 MG/ML  SOLN COMPARISON:  CT dated 03/03/2024. FINDINGS: Evaluation of this exam is limited due to respiratory motion. Lower chest: The visualized lung bases are clear. No intra-abdominal free air or free fluid. Hepatobiliary: Indeterminate 14 x 11 mm enhancing focus in the left lobe of the liver not characterized on this CT. Further characterization with MRI without and with contrast on a nonemergent/outpatient basis recommended. No biliary dilatation. No calcified gallstone or pericholecystic fluid. Pancreas: Unremarkable. No pancreatic ductal dilatation or surrounding inflammatory changes. Spleen: Normal in size without focal abnormality. Adrenals/Urinary Tract: The adrenal glands are unremarkable. Punctate nonobstructing right renal interpolar calculus. There is no hydronephrosis or obstructing stone. The left kidney is unremarkable. The visualized ureters and urinary bladder appear unremarkable. Stomach/Bowel: There is no bowel obstruction or active inflammation. The appendix is not visualized with certainty. No inflammatory changes identified in the right lower quadrant. Vascular/Lymphatic: Moderate aortoiliac atherosclerotic disease. The IVC is unremarkable. No portal venous gas. There is no adenopathy. Reproductive: The uterus is grossly unremarkable. No suspicious adnexal masses. Other: None Musculoskeletal: Osteopenia with degenerative changes spine. No acute osseous pathology. IMPRESSION: 1. No acute intra-abdominal or pelvic pathology. 2. Punctate nonobstructing right renal interpolar calculus. No hydronephrosis or obstructing stone. 3. Indeterminate 14 x 11 mm enhancing focus in the left lobe of the liver. Further characterization with MRI without and with contrast on a nonemergent/outpatient basis recommended. 4.  Aortic Atherosclerosis  (ICD10-I70.0). Electronically Signed   By: Angus Bark M.D.   On: 03/31/2024 16:59   DG Chest Portable 1 View Result Date: 03/31/2024 CLINICAL DATA:  Weakness. EXAM: PORTABLE CHEST 1 VIEW COMPARISON:  11/29/2022. FINDINGS: Bilateral lung fields are clear. Bilateral costophrenic angles are clear. Normal cardio-mediastinal silhouette. No acute osseous abnormalities. The soft tissues are within normal limits. IMPRESSION: *No active disease. Electronically Signed   By: Beula Brunswick M.D.   On: 03/31/2024 14:36    Pertinent labs & imaging results that were available during my care of the patient were reviewed by me and considered in my medical decision making (see MDM for details).  Medications Ordered in ED Medications  acetaminophen  (TYLENOL ) tablet 1,000 mg (1,000 mg Oral Not Given 03/31/24 1437)  enoxaparin  (LOVENOX ) injection 40 mg (has no administration in time range)  acetaminophen  (TYLENOL ) tablet 650 mg (has no administration in time range)    Or  acetaminophen  (TYLENOL ) suppository 650 mg (has no administration in time range)  polyethylene glycol (MIRALAX  / GLYCOLAX ) packet 17 g (has no administration in time range)  sodium chloride  flush (NS) 0.9 % injection 3 mL (has no administration in time range)  sodium chloride  0.9 % bolus 1,000 mL (0 mLs Intravenous Stopped 03/31/24 1704)  iohexol  (OMNIPAQUE ) 300 MG/ML solution 100 mL (100 mLs Intravenous Contrast Given 03/31/24 1556)  HYDROmorphone  (DILAUDID ) injection 0.5 mg (0.5 mg Intravenous Given 03/31/24 1711)  cefTRIAXone  (ROCEPHIN ) 1 g in sodium chloride  0.9 % 100 mL IVPB (0 g Intravenous Stopped 03/31/24 1826)                                                                                                                                     Procedures Procedures  (including critical care time)  Medical Decision Making / ED Course   MDM:  68 year old presenting to the emergency department with weakness.  Patient overall in no  acute distress although does appear chronically ill.  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  No sign of acute infection.  Neurologic exam seems consistent with chronic deficit.  Differential cause of weakness includes intracranial process, toxic or metabolic abnormality, occult infectious process.  Will check labs to evaluate for toxic or metabolic abnormality such as electrolyte derangement, dehydration.  Will check urinalysis, chest x-ray, CT abdomen to evaluate for underlying infectious process.  Does have chronic wounds but do not appear infected.  Will check CT head, doubt acute stroke, presentation seems consistent with prior stroke, anticipate any large stroke would be evident on CT given weakness seems to have been present since last Thursday.  Will reassess.  On chart review patient had similar presentation in April of this year ultimately determined to be due to dehydration  Clinical Course as of 03/31/24 1857  Tue Mar 31, 2024  1191 Workup shows signs of UTI, otherwise reassuring.  Discussed with the patient, she would prefer that we keep her in the hospital rather than going back to her nursing facility.  She feels that she is very weak and would have trouble there.  Discussed with the hospitalist, Dr. Cleda Curly, and he will admit patient for further management of UTI. [WS]    Clinical Course User Index [WS] Mordecai Applebaum, MD     Additional history obtained: -Additional history obtained from ems -External records from outside source obtained and reviewed including: Chart review including previous notes, labs, imaging, consultation notes including prior notes    Lab Tests: -I ordered, reviewed, and interpreted labs.   The pertinent results include:   Labs Reviewed  COMPREHENSIVE METABOLIC PANEL WITH GFR - Abnormal; Notable for the following  components:      Result Value   Potassium 3.2 (*)     All other components within normal limits  CBC WITH DIFFERENTIAL/PLATELET - Abnormal; Notable for the following components:   WBC 11.6 (*)    Neutro Abs 8.0 (*)    Monocytes Absolute 1.2 (*)    Eosinophils Absolute 0.6 (*)    All other components within normal limits  URINALYSIS, W/ REFLEX TO CULTURE (INFECTION SUSPECTED) - Abnormal; Notable for the following components:   APPearance HAZY (*)    Hgb urine dipstick SMALL (*)    Protein, ur TRACE (*)    Nitrite POSITIVE (*)    Leukocytes,Ua SMALL (*)    Bacteria, UA MANY (*)    All other components within normal limits  URINE CULTURE  LIPASE, BLOOD  APTT  PROTIME-INR  BASIC METABOLIC PANEL WITH GFR  CBC  CBG MONITORING, ED    Notable for mild hypokalemia, UTI   EKG   EKG Interpretation Date/Time:  Tuesday Mar 31 2024 13:36:19 EDT Ventricular Rate:  106 PR Interval:  189 QRS Duration:  81 QT Interval:  370 QTC Calculation: 492 R Axis:   69  Text Interpretation: Sinus tachycardia Abnormal R-wave progression, early transition Nonspecific T abnormalities, diffuse leads Borderline prolonged QT interval Artifact in lead(s) I II aVR aVL Confirmed by Hiawatha Lout ([id number]) on 03/31/2024 6:56:26 PM         Imaging Studies ordered: I ordered imaging studies including CT head, CXR, CT abdomen On my interpretation imaging demonstrates no acute process  I independently visualized and interpreted imaging. I agree with the radiologist interpretation   Medicines ordered and prescription drug management: Meds ordered this encounter  Medications   sodium chloride  0.9 % bolus 1,000 mL   acetaminophen  (TYLENOL ) tablet 1,000 mg   iohexol  (OMNIPAQUE ) 300 MG/ML solution 100 mL   HYDROmorphone  (DILAUDID ) injection 0.5 mg   cefTRIAXone  (ROCEPHIN ) 1 g in sodium chloride  0.9 % 100 mL IVPB    Antibiotic Indication::   UTI   enoxaparin  (LOVENOX ) injection 40 mg   OR Linked Order Group    acetaminophen  (TYLENOL ) tablet 650 mg     acetaminophen  (TYLENOL ) suppository 650 mg   polyethylene glycol (MIRALAX  / GLYCOLAX ) packet 17 g   sodium chloride  flush (NS) 0.9 % injection 3 mL    -I have reviewed the patients home medicines and have made adjustments as needed   Consultations Obtained: I requested consultation with the hospitalist,  and discussed lab and imaging findings as well as pertinent plan - they recommend: admission   Cardiac Monitoring: The patient was maintained on a cardiac monitor.  I personally viewed and interpreted the cardiac monitored which showed an underlying rhythm of: sinus tachycardia   Social Determinants of Health:  Diagnosis or treatment significantly limited by social determinants of health: nursing home resident   Reevaluation: After the interventions noted above, I reevaluated the patient and found that their symptoms have improved  Co morbidities that complicate the patient evaluation  Past Medical History:  Diagnosis Date   Acute CVA (cerebrovascular accident) (HCC) 12/29/2020   Anxiety    Chest pain 08/2014   unspecified   Cirrhosis (HCC)    CVA (cerebral vascular accident) (HCC) 07/27/2022   Depression    Dizziness 08/2014   Dysphagia following cerebral infarction    Dyspnea 08/2014   Dysrhythmia    Emphysema lung (HCC)    GERD (gastroesophageal reflux disease)    Heart murmur    Hemiplegia, unspecified  affecting left nondominant side (HCC)    Hepatitis C    Hiatal hernia    Hyperlipidemia    Hypertension    Insomnia    Ischemic stroke (HCC) 08/01/2022   Muscle weakness (generalized)    Other muscle spasm    PAC (premature atrial contraction) 10/11/2014   PAD (peripheral artery disease) (HCC)    Palpitations    PVC (premature ventricular contraction) 10/11/2014   PVD (peripheral vascular disease) (HCC)    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    Final Clinical Impression(s) / ED Diagnoses Final diagnoses:  Generalized weakness  Acute cystitis without hematuria     This chart was dictated using voice recognition software.  Despite best efforts to proofread,  errors can occur which can change the documentation meaning.    Mordecai Applebaum, MD 03/31/24 [phone]

## 2024-03-31 NOTE — ED Notes (Signed)
 Pt wanting pain med for her abd pain and arm wounds. Pt also states she wants to go back to nursing home. Edp aware.

## 2024-03-31 NOTE — Assessment & Plan Note (Signed)
 Currently in remission. Labs as above. Patinet has been on a 1000 mg of mycophenolate  since April 28 at least. Taper by 20 % as disease seems inactive.

## 2024-03-31 NOTE — Assessment & Plan Note (Addendum)
 Prior known hep C and cirhosis. Will order mri liver, AFP

## 2024-03-31 NOTE — Assessment & Plan Note (Signed)
 C.w. oxycodone . No fill history in PDMP

## 2024-03-31 NOTE — Assessment & Plan Note (Signed)
 With BuSpar  and Remeron 

## 2024-03-31 NOTE — ED Triage Notes (Signed)
 Pt BIB [facility] from [facility] for c/o increased weakness since Thursday last week  Staff reported pt has some slurred speech that is new  Pt has left side weakness from previous stroke

## 2024-03-31 NOTE — Assessment & Plan Note (Addendum)
 Continue with aspirin  Plavix  Lipitor  and Zetia .  Patient has chronic left hemiplegia

## 2024-04-01 ENCOUNTER — Observation Stay (HOSPITAL_COMMUNITY)

## 2024-04-01 DIAGNOSIS — N3000 Acute cystitis without hematuria: Secondary | ICD-10-CM | POA: Diagnosis not present

## 2024-04-01 DIAGNOSIS — N390 Urinary tract infection, site not specified: Secondary | ICD-10-CM | POA: Diagnosis not present

## 2024-04-01 LAB — CBC
HCT: 38.8 % (ref 36.0–46.0)
Hemoglobin: 12.5 g/dL (ref 12.0–15.0)
MCH: 31.1 pg (ref 26.0–34.0)
MCHC: 32.2 g/dL (ref 30.0–36.0)
MCV: 96.5 fL (ref 80.0–100.0)
Platelets: 328 10*3/uL (ref 150–400)
RBC: 4.02 MIL/uL (ref 3.87–5.11)
RDW: 13.3 % (ref 11.5–15.5)
WBC: 11.1 10*3/uL — ABNORMAL HIGH (ref 4.0–10.5)
nRBC: 0 % (ref 0.0–0.2)

## 2024-04-01 LAB — BASIC METABOLIC PANEL WITH GFR
Anion gap: 10 (ref 5–15)
BUN: 8 mg/dL (ref 8–23)
CO2: 22 mmol/L (ref 22–32)
Calcium: 8.9 mg/dL (ref 8.9–10.3)
Chloride: 108 mmol/L (ref 98–111)
Creatinine, Ser: 0.77 mg/dL (ref 0.44–1.00)
GFR, Estimated: >60 mL/min (ref >60)
Glucose, Bld: 76 mg/dL (ref 70–99)
Potassium: 3.5 mmol/L (ref 3.5–5.1)
Sodium: 136 mmol/L (ref 135–145)

## 2024-04-01 LAB — TROPONIN I (HIGH SENSITIVITY): Troponin I (High Sensitivity): 12 ng/L (ref <18)

## 2024-04-01 LAB — TSH: TSH: 0.155 u[IU]/mL — ABNORMAL LOW (ref 0.350–4.500)

## 2024-04-01 LAB — PROTIME-INR
INR: 1.1 (ref 0.8–1.2)
Prothrombin Time: 14.7 s (ref 11.4–15.2)

## 2024-04-01 LAB — T4, FREE: Free T4: 1.3 ng/dL — ABNORMAL HIGH (ref 0.61–1.12)

## 2024-04-01 LAB — APTT: aPTT: 28 s (ref 24–36)

## 2024-04-01 MED ORDER — CEPHALEXIN 500 MG PO CAPS: 500.0000 mg | ORAL_CAPSULE | Freq: Two times a day (BID) | ORAL | Status: AC

## 2024-04-01 MED ORDER — OXYCODONE HCL 5 MG PO CAPS: 5.0000 mg | ORAL_CAPSULE | Freq: Four times a day (QID) | ORAL | 0 refills | Status: AC | PRN

## 2024-04-01 MED ORDER — GADOBUTROL 1 MMOL/ML IV SOLN
5.0000 mL | Freq: Once | INTRAVENOUS | Status: AC | PRN
  Administered 2024-04-01: 5 mL via INTRAVENOUS

## 2024-04-01 MED ORDER — ALPRAZOLAM 0.5 MG PO TABS: 0.5000 mg | ORAL_TABLET | Freq: Three times a day (TID) | ORAL | 0 refills | Status: AC

## 2024-04-01 NOTE — Evaluation (Signed)
 Physical Therapy Evaluation Patient Details Name: Kara Bailey MRN: [id number] DOB: [birth year] Today's Date: 04/01/2024  History of Present Illness  Kara Bailey is a 68 y.o. female with medical history significant of prior stroke complicated by left hemiplegia chronic and dysphona. Other PMH as below.Kara Bailey is resident of SNF and reports increasing fatigue X 3 days.      Her fatigue is generalized in nature she does not report new weakness no loss of consciousness no chest pain no shortness of breath no rigors or chills or fever no diarrhea no vomiting no dysuria no rash on her skin no trauma.     Patient came to the ER due to above complaints, patient noted to have urinary tract infection on investigation in the ER.  Patient has been started on ceftriaxone , medical evaluation is sought.   Clinical Impression  Patient demonstrates slow labored movement for sitting up at bedside, able to use RUE for leaning on armrest of chair during stand pivot transfer with left knee blocked. Patient unable to ambulate due to left sided weakness, poor standing balance. Patient tolerated sitting up in chair after therapy.  PLAN:  Patient to be discharged from hospital today and discharged from acute physical therapy to care of nursing for out of bed as tolerated for length of stay with recommendations stated below           If plan is discharge home, recommend the following: A lot of help with bathing/dressing/bathroom;A lot of help with walking and/or transfers;Help with stairs or ramp for entrance;Assistance with cooking/housework   Can travel by private vehicle   No    Equipment Recommendations None recommended by PT  Recommendations for Other Services       Functional Status Assessment Patient has had a recent decline in their functional status and/or demonstrates limited ability to make significant improvements in function in a reasonable and predictable amount of time     Precautions /  Restrictions Precautions Precautions: Fall Recall of Precautions/Restrictions: Intact Restrictions Weight Bearing Restrictions Per Provider Order: No      Mobility  Bed Mobility Overal bed mobility: Needs Assistance Bed Mobility: Supine to Sit     Supine to sit: Mod assist, Max assist     General bed mobility comments: increased time, labored movement    Transfers Overall transfer level: Needs assistance Equipment used: 1 person hand held assist Transfers: Sit to/from Stand, Bed to chair/wheelchair/BSC Sit to Stand: Mod assist, Max assist Stand pivot transfers: Mod assist, Max assist         General transfer comment: Required left knee blocked for completing stand pivot    Ambulation/Gait                  Stairs            Wheelchair Mobility     Tilt Bed    Modified Rankin (Stroke Patients Only)       Balance Overall balance assessment: Needs assistance Sitting-balance support: Feet supported, No upper extremity supported Sitting balance-Leahy Scale: Fair Sitting balance - Comments: fair/good seated at EOB   Standing balance support: During functional activity, No upper extremity supported Standing balance-Leahy Scale: Poor Standing balance comment: hand held assist                             Pertinent Vitals/Pain Pain Assessment Pain Assessment: No/denies pain    Home Living Family/patient expects to be discharged  to:: Skilled nursing facility                        Prior Function Prior Level of Function : Needs assist       Physical Assist : ADLs (physical);Mobility (physical) Mobility (physical): Transfers;Bed mobility;Gait;Stairs ADLs (physical): Bathing;Dressing;Toileting;IADLs;Grooming Mobility Comments: Pt assited for transfers at facility. ADLs Comments: Assit by SNF staff. Pt can reportedly feed her self per chart review and pt seeming to confirm that.     Extremity/Trunk Assessment   Upper  Extremity Assessment Upper Extremity Assessment: Generalized weakness;Right hand dominant;LUE deficits/detail LUE Deficits / Details: grossly 0/5, contractures LUE Sensation: decreased light touch;decreased proprioception LUE Coordination: decreased fine motor;decreased gross motor    Lower Extremity Assessment Lower Extremity Assessment: Generalized weakness;LLE deficits/detail LLE Deficits / Details: grossly -2/5, ankle dorsiflexion 0/5 LLE Sensation: decreased light touch LLE Coordination: decreased fine motor    Cervical / Trunk Assessment Cervical / Trunk Assessment: Kyphotic  Communication   Communication Communication: Impaired Factors Affecting Communication: Difficulty expressing self    Cognition Arousal: Alert Behavior During Therapy: WFL for tasks assessed/performed   PT - Cognitive impairments: Difficult to assess                         Following commands: Intact       Cueing Cueing Techniques: Verbal cues, Tactile cues     General Comments      Exercises     Assessment/Plan    PT Assessment All further PT needs can be met in the next venue of care  PT Problem List Decreased strength;Decreased activity tolerance;Decreased balance;Decreased mobility       PT Treatment Interventions      PT Goals (Current goals can be found in the Care Plan section)  Acute Rehab PT Goals Patient Stated Goal: return home PT Goal Formulation: With patient Time For Goal Achievement: 03/25/24 Potential to Achieve Goals: Good    Frequency       Co-evaluation               AM-PAC PT "6 Clicks" Mobility  Outcome Measure Help needed turning from your back to your side while in a flat bed without using bedrails?: A Lot Help needed moving from lying on your back to sitting on the side of a flat bed without using bedrails?: A Lot Help needed moving to and from a bed to a chair (including a wheelchair)?: A Lot Help needed standing up from a chair  using your arms (e.g., wheelchair or bedside chair)?: A Lot Help needed to walk in hospital room?: Total Help needed climbing 3-5 steps with a railing? : Total 6 Click Score: 10    End of Session   Activity Tolerance: Patient tolerated treatment well;Patient limited by fatigue Patient left: in chair;with call bell/phone within reach;with chair alarm set Nurse Communication: Mobility status PT Visit Diagnosis: Unsteadiness on feet (R26.81);Other abnormalities of gait and mobility (R26.89);Muscle weakness (generalized) (M62.81)    Time: 1020-1040 PT Time Calculation (min) (ACUTE ONLY): 20 min   Charges:   PT Evaluation $PT Eval Moderate Complexity: 1 Mod PT Treatments $Therapeutic Exercise: 8-22 mins PT General Charges $$ ACUTE PT VISIT: 1 Visit         12:00 PM, 04/01/24 Walton Guppy, MPT Physical Therapist with [facility] [phone] office [phone] mobile phone

## 2024-04-01 NOTE — TOC Transition Note (Addendum)
 Transition of Care [facility]) - Discharge Note   Patient Details  Name: Kara Bailey MRN: [id number] Date of Birth: Feb 10, 1956  Transition of [facility]) CM/SW Contact:  Orelia Binet, RN Phone Number: 04/01/2024, 12:30 PM   Clinical Narrative:   Patient admitted in OBS from [facility]. Patient is medically ready to return, Stephenie Einstein approved with out Auth, patient has been here 17 hours. RN Calling report, TOC calling EMS. Clinicals sent in the hub.  Daughter updated.    Final next level of care: Long Term Acute Care (LTAC) Barriers to Discharge: Continued Medical Work up   Patient Goals and CMS Choice Patient states their goals for this hospitalization and ongoing recovery are:: return to [email or web address] Compare Post Acute Care list provided to:: Patient Choice offered to / list presented to : Patient     Discharge Placement                 Name of family member notified: Left voice message with walter Patient and family notified of of transfer: 04/01/24  Discharge Plan and Services Additional resources added to the After Visit Summary for         Social Drivers of Health (SDOH) Interventions SDOH Screenings   Food Insecurity: No Food Insecurity (03/31/2024)  Housing: Low Risk  (03/31/2024)  Transportation Needs: No Transportation Needs (03/31/2024)  Utilities: Not At Risk (03/31/2024)  Depression (PHQ2-9): High Risk (09/19/2022)  Social Connections: Socially Isolated (03/31/2024)  Tobacco Use: Medium Risk (03/31/2024)     Readmission Risk Interventions    12/17/2022    1:42 PM  Readmission Risk Prevention Plan  Transportation Screening Complete  PCP or Specialist Appt within 3-5 Days Complete  HRI or Home Care Consult Complete  Social Work Consult for Recovery Care Planning/Counseling Complete  Palliative Care Screening Not Applicable  Medication Review Oceanographer) Referral to Pharmacy

## 2024-04-01 NOTE — Discharge Summary (Signed)
 Physician Discharge Summary   Kara Bailey: Kara Bailey Bailey MRN: [id number] DOB: April 20, 1956  Admit date:     03/31/2024  Discharge date: 04/01/24  Discharge Physician: Wynetta Heckle   PCP: Forestine Igo, DO   Recommendations at discharge:  Follow up abdominal MRI and AFP (pending at discharge, pursued due to incidental spot on CT) Follow up repeat free T3, free T4 and TRAb's, consider endocrinology evaluation.   Discharge Diagnoses: Principal Problem:   UTI (urinary tract infection) Active Problems:   Generalized weakness   Liver mass   Bullous pemphigoid  Hospital Course: HPI: Kara Bailey Bailey is a 68 y.o. female with medical history significant of prior stroke complicated by left hemiplegia chronic and dysphona. Other PMH as below.Kara Bailey Bailey is resident of SNF and reports increasing fatigue X 3 days.    Her fatigue is generalized in nature she does not report new weakness no loss of consciousness no chest pain no shortness of breath no rigors or chills or fever no diarrhea no vomiting no dysuria no rash on her skin no trauma.   Kara Bailey came to the ER due to above complaints, Kara Bailey noted to have urinary tract infection on investigation in the ER.  Kara Bailey has been started on ceftriaxone , medical evaluation is sought.  She has returned to baseline. She states she would prefer to go to an alternative facility. CSW has recommended this be initiated as an outpatient. She will be discharged in stable condition with plans to follow up final urine culture results, continued keflex empirically for UTI, and follow up other pending results as discussed in detail elsewhere.   Assessment and Plan: UTI:  - Continue antibiotics to complete 7 days with keflex, discharging MD will monitor urine culture results and adjust abx as indicated.   Abnormal TSH: TSH is low, free T3 and free T4 are reflexed/pending. TSH has run low in the past. No hx thyroid disorders, no biotin explicitly stated in [facility], though  would stop this if she is taking it. She is not clinically hyperthyroid at this time. Will also check TRA Ab's and request SNF MD to follow up.  History of stroke Continue with aspirin  Plavix  Lipitor  and Zetia .  Kara Bailey has chronic left hemiplegia  Bullous pemphigoid Currently in remission. Labs as above. Patinet has been on a 1000 mg of mycophenolate  since April 28 at least. Needs dermatology follow up to inform dosing.  Liver mass Prior known hep C and cirhosis.  - Liver MRI recommended as outpatient, this was performed on day of discharge and AFP was collected, both pending at time of discharge. Needs GI follow up.   Generalized weakness Or fatigue X 3 days. Check Tsh and troponin. Likely due to UTI. Strongly suggest decreasing benzodiazepine dosing (deferred to outpt MD)  Dysphagia Kara Bailey reports regular diet at home.  Generalized anxiety disorder With BuSpar  and Remeron   Back muscle spasm C.w. oxycodone .   Consultants: None Procedures performed: None  Disposition: Home Diet recommendation: SNF Regular DISCHARGE MEDICATION: Allergies as of 04/01/2024   No Known Allergies      Medication List     STOP taking these medications    sodium chloride  0.9 % infusion       TAKE these medications    acetaminophen  325 MG tablet Commonly known as: TYLENOL  Take 2 tablets (650 mg total) by mouth every 6 (six) hours as needed for mild pain (pain score 1-3), fever, headache or moderate pain (pain score 4-6). What changed: Another medication with the same  name was removed. Continue taking this medication, and follow the directions you see here.   albuterol  108 (90 Base) MCG/ACT inhaler Commonly known as: VENTOLIN  HFA Inhale 2 puffs into the lungs every 6 (six) hours as needed for wheezing or shortness of breath.   ALPRAZolam  0.5 MG tablet Commonly known as: XANAX  Take 1 tablet (0.5 mg total) by mouth 3 (three) times daily.   aspirin  EC 81 MG tablet Take 81 mg by mouth  daily. Swallow whole.   atorvastatin  80 MG tablet Commonly known as: LIPITOR  Take 1 tablet (80 mg total) by mouth every evening.   busPIRone  5 MG tablet Commonly known as: BUSPAR  Take 1 tablet (5 mg total) by mouth 3 (three) times daily.   Calcium  1000 + D 1000-20 MG-MCG Tabs Generic drug: Calcium  Carb-Cholecalciferol Take 1 tablet by mouth daily.   cephALEXin 500 MG capsule Commonly known as: KEFLEX Take 1 capsule (500 mg total) by mouth 2 (two) times daily for 5 days.   clopidogrel  75 MG tablet Commonly known as: PLAVIX  Take 1 tablet (75 mg total) by mouth daily.   ezetimibe  10 MG tablet Commonly known as: ZETIA  Take 10 mg by mouth daily.   hydroxypropyl methylcellulose / hypromellose 2.5 % ophthalmic solution Commonly known as: ISOPTO TEARS / GONIOVISC Place 1 drop into both eyes in the morning and at bedtime.   mirtazapine  7.5 MG tablet Commonly known as: REMERON  Take 7.5 mg by mouth at bedtime.   mycophenolate  500 MG tablet Commonly known as: CELLCEPT  Take 1,000 mg by mouth daily.   Nutritional Supplement Liqd Take 120 mLs by mouth daily. Listed as House Supplement in [facility]   ondansetron  4 MG disintegrating tablet Commonly known as: ZOFRAN -ODT Take 4 mg by mouth every 8 (eight) hours as needed for nausea or vomiting.   oxycodone  5 MG capsule Commonly known as: OXY-IR Take 1 capsule (5 mg total) by mouth every 6 (six) hours as needed (moderate to severe pain). What changed: when to take this   polyethylene glycol 17 g packet Commonly known as: MIRALAX  / GLYCOLAX  Take 17 g by mouth daily as needed. What changed:  when to take this reasons to take this   thiamine  100 MG tablet Commonly known as: Vitamin B-1 Take 100 mg by mouth daily.        Contact information for follow-up providers     Forestine Igo, DO Follow up.   Specialty: General Practice Contact information: [address] [place] [address] [phone]              Contact  information for after-discharge care     Destination     [facility] .   Service: Skilled Nursing Contact information: [address] [place]  [address] [phone]                    Discharge Exam: Kara Bailey Bailey Weights   04/01/24 1043  Weight: 63 kg   No distress, pleasant Dysarthria, low volume at pt's reported baseline Clear, nonlabored RRR Soft, NT, ND  Condition at discharge: stable  The results of significant diagnostics from this hospitalization (including imaging, microbiology, ancillary and laboratory) are listed below for reference.   Imaging Studies: MR ABDOMEN W WO CONTRAST Result Date: 04/01/2024 CLINICAL DATA:  Liver lesion. EXAM: MRI ABDOMEN WITHOUT AND WITH CONTRAST TECHNIQUE: Multiplanar multisequence MR imaging of the abdomen was performed both before and after the administration of intravenous contrast. CONTRAST:  5mL GADAVIST GADOBUTROL  1 MMOL/ML IV SOLN COMPARISON:  CT 03/31/2024. Multiple older examinations as well. MRCP 12/08/2022. FINDINGS: Lower chest: Dependent opacities seen along both lungs with interstitial changes. Components of atelectasis are favored. Increased from previous. Hepatobiliary: Posteriorly in the left lobe lateral segment is a rounded heterogeneous bright T2, low T1 lesion measuring 18 mm on series 4, image 12. Lesion demonstrates progressive peripheral nodular enhancement consistent with hemangioma. In retrospect this has been present since study of 2014. Several sequences are limited by motion such as in out of phase. No separate enhancing space-occupying liver lesion. Patent portal vein. Gallbladder is present. Common duct has a diameter approaching 8 mm, within normal limits for the Kara Bailey's age. No separate intrahepatic biliary ductal dilatation. No areas of restricted diffusion in the liver. Pancreas: Global pancreatic atrophy. No abnormal pancreatic T1 precontrast signal.  No restricted diffusion. No abnormal enhancement. Spleen:  Within normal limits in size and appearance. Adrenals/Urinary Tract: No masses identified. No evidence of hydronephrosis. Stomach/Bowel: The visualized portions of the small large bowel are nondilated. Stomach is relatively collapsed. Vascular/Lymphatic: Atherosclerotic changes along the aorta. Normal caliber aorta and IVC. No specific abnormal lymph node enlargement in the visualized abdomen. Other: Evaluation limited by motion along several sequences. No ascites identified at this time. Musculoskeletal: Slight curvature and degenerative changes along the spine. IMPRESSION: Lesion seen in the left hepatic lobe has signal characteristics consistent with a hemangioma. This has been present since at least 2014. Motion. Lung base atelectasis. Electronically Signed   By: Adrianna Horde M.D.   On: 04/01/2024 11:35   CT Head Wo Contrast Result Date: 03/31/2024 CLINICAL DATA:  Weakness and slurred speech. EXAM: CT HEAD WITHOUT CONTRAST TECHNIQUE: Contiguous axial images were obtained from the base of the skull through the vertex without intravenous contrast. RADIATION DOSE REDUCTION: This exam was performed according to the departmental dose-optimization program which includes automated exposure control, adjustment of the mA and/or kV according to Kara Bailey size and/or use of iterative reconstruction technique. COMPARISON:  March 03, 2024 FINDINGS: Brain: There is generalized cerebral atrophy with widening of the extra-axial spaces and ventricular dilatation. There are areas of decreased attenuation within the white matter tracts of the supratentorial brain, consistent with microvascular disease changes. Chronic bilateral basal ganglia lacunar infarcts are noted. Vascular: No hyperdense vessel or unexpected calcification. Skull: Normal. Negative for fracture or focal lesion. Sinuses/Orbits: No acute finding. Other: None. IMPRESSION: 1. Generalized cerebral atrophy  and microvascular disease changes of the supratentorial brain. 2. Chronic bilateral basal ganglia lacunar infarcts. 3. No acute intracranial abnormality. Electronically Signed   By: Virgle Grime M.D.   On: 03/31/2024 17:17   CT ABDOMEN PELVIS W CONTRAST Result Date: 03/31/2024 CLINICAL DATA:  Left lower quadrant abdominal pain. EXAM: CT ABDOMEN AND PELVIS WITH CONTRAST TECHNIQUE: Multidetector CT imaging of the abdomen and pelvis was performed using the standard protocol following bolus administration of intravenous contrast. RADIATION DOSE REDUCTION: This exam was performed according to the departmental dose-optimization program which includes automated exposure control, adjustment of the mA and/or kV according to Kara Bailey size and/or use of iterative reconstruction technique. CONTRAST:  OMNIPAQUE  IOHEXOL  300 MG/ML  SOLN COMPARISON:  CT dated 03/03/2024. FINDINGS: Evaluation of this exam is limited due to respiratory motion. Lower chest: The visualized lung bases are clear. No intra-abdominal free air or free fluid. Hepatobiliary: Indeterminate 14 x 11 mm enhancing focus in the left lobe of the liver not characterized on this CT. Further characterization with MRI without and with contrast on a  nonemergent/outpatient basis recommended. No biliary dilatation. No calcified gallstone or pericholecystic fluid. Pancreas: Unremarkable. No pancreatic ductal dilatation or surrounding inflammatory changes. Spleen: Normal in size without focal abnormality. Adrenals/Urinary Tract: The adrenal glands are unremarkable. Punctate nonobstructing right renal interpolar calculus. There is no hydronephrosis or obstructing stone. The left kidney is unremarkable. The visualized ureters and urinary bladder appear unremarkable. Stomach/Bowel: There is no bowel obstruction or active inflammation. The appendix is not visualized with certainty. No inflammatory changes identified in the right lower quadrant. Vascular/Lymphatic:  Moderate aortoiliac atherosclerotic disease. The IVC is unremarkable. No portal venous gas. There is no adenopathy. Reproductive: The uterus is grossly unremarkable. No suspicious adnexal masses. Other: None Musculoskeletal: Osteopenia with degenerative changes spine. No acute osseous pathology. IMPRESSION: 1. No acute intra-abdominal or pelvic pathology. 2. Punctate nonobstructing right renal interpolar calculus. No hydronephrosis or obstructing stone. 3. Indeterminate 14 x 11 mm enhancing focus in the left lobe of the liver. Further characterization with MRI without and with contrast on a nonemergent/outpatient basis recommended. 4.  Aortic Atherosclerosis (ICD10-I70.0). Electronically Signed   By: Angus Bark M.D.   On: 03/31/2024 16:59   DG Chest Portable 1 View Result Date: 03/31/2024 CLINICAL DATA:  Weakness. EXAM: PORTABLE CHEST 1 VIEW COMPARISON:  11/29/2022. FINDINGS: Bilateral lung fields are clear. Bilateral costophrenic angles are clear. Normal cardio-mediastinal silhouette. No acute osseous abnormalities. The soft tissues are within normal limits. IMPRESSION: *No active disease. Electronically Signed   By: Beula Brunswick M.D.   On: 03/31/2024 14:36   EEG adult Result Date: 03/05/2024 Arleene Lack, MD     03/05/2024  8:47 AM Kara Bailey Bailey MRN: [id number] Epilepsy Attending: Arleene Lack Referring Physician/Provider: Demaris Fillers, MD Date: 03/04/2024 Duration: 22.10 mins Kara Bailey history: 68yo F with ams. EEG to evaluate for seizure Level of alertness: Awake AEDs during EEG study: None Technical aspects: This EEG study was done with scalp electrodes positioned according to the 10-20 International system of electrode placement. Electrical activity was reviewed with band pass filter of 1-70Hz , sensitivity of 7 uV/mm, display speed of 37mm/sec with a 60Hz  notched filter applied as appropriate. EEG data were recorded continuously and digitally stored.  Video monitoring was  available and reviewed as appropriate. Description: The posterior dominant rhythm consists of 8 Hz activity of moderate voltage (25-35 uV) seen predominantly in posterior head regions, symmetric and reactive to eye opening and eye closing. Hyperventilation and photic stimulation were not performed.   IMPRESSION: This study is within normal limits. No seizures or epileptiform discharges were seen throughout the recording. A normal interictal EEG does not exclude the diagnosis of epilepsy. Arleene Lack   ECHOCARDIOGRAM COMPLETE Result Date: 03/04/2024    ECHOCARDIOGRAM REPORT   Kara Bailey Name:   ISOBELLA Bailey Date of Exam: 03/04/2024 Medical Rec #:  [id number]        Height:       66.0 in Accession #:    [id number]       Weight:       157.0 lb Date of Birth:  1956/06/09         BSA:          1.804 m Kara Bailey Age:    68 years         BP:           153/73 mmHg Kara Bailey Gender: F                HR:  88 bpm. Exam Location:  [facility] Procedure: 2D Echo, Cardiac Doppler, Color Doppler and Intracardiac            Opacification Agent (Both Spectral and Color Flow Doppler were            utilized during procedure). Indications:    Stroke l63.9  History:        Kara Bailey has prior history of Echocardiogram examinations, most                 recent 07/26/2022. Stroke, Arrythmias:Tachycardia, PAC and PVC;                 Risk Factors:Hypertension.  Sonographer:    Denese Finn RCS Referring Phys: [phone] DAVID TAT IMPRESSIONS  1. Left ventricular ejection fraction, by estimation, is >75%. The left ventricle has hyperdynamic function. The left ventricle has no regional wall motion abnormalities. There is moderate left ventricular hypertrophy. Left ventricular diastolic parameters are consistent with Grade I diastolic dysfunction (impaired relaxation).  2. Right ventricular systolic function is normal. The right ventricular size is normal.  3. The mitral valve is normal in structure. No evidence of mitral valve  regurgitation. No evidence of mitral stenosis.  4. The aortic valve is tricuspid. There is mild calcification of the aortic valve. There is mild thickening of the aortic valve. Aortic valve regurgitation is not visualized. No aortic stenosis is present. FINDINGS  Left Ventricle: Left ventricular ejection fraction, by estimation, is >75%. The left ventricle has hyperdynamic function. The left ventricle has no regional wall motion abnormalities. Definity  contrast agent was given IV to delineate the left ventricular endocardial borders. The left ventricular internal cavity size was normal in size. There is moderate left ventricular hypertrophy. Left ventricular diastolic parameters are consistent with Grade I diastolic dysfunction (impaired relaxation). Right Ventricle: The right ventricular size is normal. Right vetricular wall thickness was not well visualized. Right ventricular systolic function is normal. Left Atrium: Left atrial size was normal in size. Right Atrium: Right atrial size was normal in size. Pericardium: There is no evidence of pericardial effusion. Mitral Valve: The mitral valve is normal in structure. No evidence of mitral valve regurgitation. No evidence of mitral valve stenosis. Tricuspid Valve: The tricuspid valve is normal in structure. Tricuspid valve regurgitation is not demonstrated. No evidence of tricuspid stenosis. Aortic Valve: The aortic valve is tricuspid. There is mild calcification of the aortic valve. There is mild thickening of the aortic valve. There is mild aortic valve annular calcification. Aortic valve regurgitation is not visualized. No aortic stenosis  is present. Aortic valve mean gradient measures 9.0 mmHg. Aortic valve peak gradient measures 15.5 mmHg. Aortic valve area, by VTI measures 1.60 cm. Pulmonic Valve: The pulmonic valve was not well visualized. Pulmonic valve regurgitation is not visualized. No evidence of pulmonic stenosis. Aorta: The aortic root is normal in  size and structure. Venous: IVC is small suggesting low RA pressure and hypovolemia. IAS/Shunts: The interatrial septum was not well visualized.  LEFT VENTRICLE PLAX 2D LVIDd:         3.10 cm   Diastology LVIDs:         2.07 cm   LV e' medial:    3.70 cm/s LV PW:         1.30 cm   LV E/e' medial:  15.8 LV IVS:        1.40 cm   LV e' lateral:   5.98 cm/s LVOT diam:     1.50 cm  LV E/e' lateral: 9.8 LV SV:         51 LV SV Index:   28 LVOT Area:     1.77 cm  RIGHT VENTRICLE RV S prime:     16.50 cm/s TAPSE (M-mode): 2.0 cm LEFT ATRIUM             Index        RIGHT ATRIUM           Index LA diam:        3.10 cm 1.72 cm/m   RA Area:     10.90 cm LA Vol (A2C):   38.1 ml 21.12 ml/m  RA Volume:   24.60 ml  13.64 ml/m LA Vol (A4C):   38.1 ml 21.12 ml/m LA Biplane Vol: 38.5 ml 21.34 ml/m  AORTIC VALVE AV Area (Vmax):    1.58 cm AV Area (Vmean):   1.85 cm AV Area (VTI):     1.60 cm AV Vmax:           197.00 cm/s AV Vmean:          132.000 cm/s AV VTI:            0.319 m AV Peak Grad:      15.5 mmHg AV Mean Grad:      9.0 mmHg LVOT Vmax:         176.00 cm/s LVOT Vmean:        138.500 cm/s LVOT VTI:          0.288 m LVOT/AV VTI ratio: 0.90  AORTA Ao Root diam: 3.00 cm MITRAL VALVE MV Area (PHT): 2.03 cm     SHUNTS MV Decel Time: 373 msec     Systemic VTI:  0.29 m MV E velocity: 58.50 cm/s   Systemic Diam: 1.50 cm MV A velocity: 144.00 cm/s MV E/A ratio:  0.41 Armida Lander MD Electronically signed by Armida Lander MD Signature Date/Time: 03/04/2024/2:54:36 PM    Final    MR Brain Wo Contrast (neuro protocol) Result Date: 03/03/2024 CLINICAL DATA:  Provided history: Neuro deficit, acute, stroke suspected. EXAM: MRI HEAD WITHOUT CONTRAST TECHNIQUE: Multiplanar, multiecho pulse sequences of the brain and surrounding structures were obtained without intravenous contrast. COMPARISON:  Non-contrast head CT and CT angiogram head/neck performed earlier today 03/03/2024. Brain MRI 12/01/2022. FINDINGS: Brain:  Mild-to-moderate generalized cerebral atrophy. Mild cerebellar atrophy. Chronic perforator infarct within the right corona radiata/basal ganglia. Chronic hemosiderin deposition at this site. Chronic lacunar infarcts elsewhere within/about the bilateral deep gray nuclei. Background multifocal T2 FLAIR hyperintense signal abnormality within the cerebral white matter, basal ganglia and pons compatible with chronic small vessel ischemic disease. Moderate-sized chronic infarct (with associated chronic blood products) in the superior right cerebellar hemisphere. There is no acute infarct. No evidence of an intracranial mass. No extra-axial fluid collection. No midline shift. Vascular: Maintained flow voids within the proximal large arterial vessels. Skull and upper cervical spine: No focal worrisome marrow lesion. Incompletely assessed cervical spondylosis. Sinuses/Orbits: No mass or acute finding within the imaged orbits. Prior bilateral ocular lens replacement. No significant paranasal sinus disease. IMPRESSION: 1.  No evidence of an acute intracranial abnormality. 2. Parenchymal atrophy, chronic small vessel disease and unchanged chronic infarcts, as described within the body of the report. Electronically Signed   By: Bascom Lily D.O.   On: 03/03/2024 17:08   CT CHEST ABDOMEN PELVIS WO CONTRAST Result Date: 03/03/2024 CLINICAL DATA:  Abdominal pain, acute, nonlocalized EXAM: CT CHEST, ABDOMEN AND PELVIS WITHOUT CONTRAST  TECHNIQUE: Multidetector CT imaging of the chest, abdomen and pelvis was performed following the standard protocol without IV contrast. RADIATION DOSE REDUCTION: This exam was performed according to the departmental dose-optimization program which includes automated exposure control, adjustment of the mA and/or kV according to Kara Bailey size and/or use of iterative reconstruction technique. COMPARISON:  February 05, 2023 FINDINGS: Of note, the lack of intravenous contrast limits evaluation of the solid  organ parenchyma and vascularity. CT CHEST FINDINGS Cardiovascular: No cardiomegaly or pericardial effusion.No aortic aneurysm. Mediastinum/Nodes: No mediastinal mass. No mediastinal, hilar, or axillary lymphadenopathy. Lungs/Pleura: The midline trachea and bronchi are patent. Biapical pleuroparenchymal scarring. No focal airspace consolidation, pleural effusion, or pneumothorax. Posterior bibasilar dependent atelectasis. CT ABDOMEN PELVIS FINDINGS Hepatobiliary: No mass.No radiopaque stones or wall thickening of the gallbladder.No intrahepatic or extrahepatic biliary ductal dilation. Pancreas: No mass or main ductal dilation.No peripancreatic inflammation or fluid collection. Spleen: Normal size. No mass. Adrenals/Urinary Tract: No adrenal masses. No renal mass. No hydronephrosis or nephrolithiasis. The urinary bladder is distended without focal abnormality. Stomach/Bowel: The stomach is decompressed without focal abnormality. No small bowel wall thickening or inflammation. No small bowel obstruction.The appendix was not visualized. No right lower quadrant or pericecal inflammatory changes to suggest acute appendicitis. Scattered colonic diverticulosis. Vascular/Lymphatic: No aortic aneurysm. Diffuse aortoiliac atherosclerosis. No intraabdominal or pelvic lymphadenopathy. Reproductive: Age-related atrophy of the uterus and ovaries. No concerning adnexal mass. No free pelvic fluid. Other: No pneumoperitoneum, ascites, or mesenteric inflammation. Musculoskeletal: No acute fracture or destructive lesion. Osteopenia. IMPRESSION: 1. No acute abnormality within the chest, abdomen, or pelvis. 2. Scattered colonic diverticulosis. No changes of acute diverticulitis. Electronically Signed   By: Rance Burrows M.D.   On: 03/03/2024 17:08   CT Angio Head Neck W WO CM (CODE STROKE) Result Date: 03/03/2024 CLINICAL DATA:  Provided history: Neuro deficit, acute, stroke suspected. Abnormal speech. Increased weakness. EXAM: CT  ANGIOGRAPHY HEAD AND NECK WITH AND WITHOUT CONTRAST TECHNIQUE: Multidetector CT imaging of the head and neck was performed using the standard protocol during bolus administration of intravenous contrast. Multiplanar CT image reconstructions and MIPs were obtained to evaluate the vascular anatomy. Carotid stenosis measurements (when applicable) are obtained utilizing NASCET criteria, using the distal internal carotid diameter as the denominator. RADIATION DOSE REDUCTION: This exam was performed according to the departmental dose-optimization program which includes automated exposure control, adjustment of the mA and/or kV according to Kara Bailey size and/or use of iterative reconstruction technique. CONTRAST:  75mL OMNIPAQUE  IOHEXOL  350 MG/ML SOLN COMPARISON:  Non-contrast head CT performed earlier today 03/03/2024. CT angiogram head/neck 07/26/2022. FINDINGS: CTA NECK FINDINGS Aortic arch: The visualized thoracic aorta is normal in caliber. Atherosclerotic plaque within the proximal major branch vessels of the neck. The right subclavian artery origin is excluded from the field of view. Within this limitation, no hemodynamically significant innominate or proximal subclavian artery stenosis is identified. Right carotid system: CCA and ICA patent within the neck. Atherosclerotic plaque within the mid CCA with up to 50% stenosis relative to the normal caliber CCA, progressed from the prior CTA. Soft plaque within the proximal ICA without hemodynamically significant stenosis (50% or greater). Left carotid system: The common carotid artery origin is incompletely included in the field of view. Within this limitation, the CCA and ICA are patent within the neck without measurable stenosis. Mild atherosclerotic plaque within the proximal ICA. Ulcerated plaque within the carotid bulb (for instance as seen on series 8, image 128). Vertebral arteries: Patent within the neck without stenosis or significant atherosclerotic  disease.  The right vertebral artery is dominant. Skeleton: The Kara Bailey is edentulous. Cervical spondylosis. Developmental C2-C3 non segmentation. Cervical spondylosis. No acute fracture or aggressive osseous lesion. Other neck: Multiple thyroid nodules, the largest within the right lobe measuring 16 mm. Upper chest: No consolidation within the imaged lung apices. Review of the MIP images confirms the above findings CTA HEAD FINDINGS Anterior circulation: The intracranial internal carotid arteries are patent. Nonstenotic atherosclerotic plaque within both vessels. Severe stenosis within the right middle cerebral artery distal M1 segment, progressed from the prior CTA of 07/26/2022 (series 9, image 22) (series 10, image 18). No M2 proximal branch occlusion or high-grade proximal stenosis. The anterior cerebral arteries are patent. Mildly hypoplastic left A1 segment. No intracranial aneurysm is identified. Posterior circulation: The intracranial vertebral arteries are patent. The basilar artery is patent. The posterior cerebral arteries are patent. Atherosclerotic irregularity of both vessels. Most notably, there is a severe stenosis within the right PCA at the P3/P4 junction (series 11, image 13) and there are sites of moderate stenosis and there is up to moderate stenosis within the left PCA at the P3/P4 junction. Venous sinuses: Within the limitations of contrast timing, no convincing thrombus. Anatomic variants: As described. Review of the MIP images confirms the above findings No emergent large vessel occlusion identified. These results were communicated to Dr. Janett Medin at 2:16 pmon 4/22/2025by text page via the [facility] messaging system. IMPRESSION: CTA neck: 1. The common carotid and internal carotid arteries are patent within the neck without hemodynamically significant stenosis (50% or greater). Atherosclerotic plaque bilaterally, as described. Ulcerated plaque again demonstrated within the left carotid bulb. 2. The  vertebral arteries are patent within the neck without stenosis or significant atherosclerotic disease. 3. Multiple thyroid nodules, the largest within the right lobe measuring 16 mm. A non-emergent thyroid ultrasound is recommended for further evaluation. Reference: J Am Coll Radiol. 2015 Feb;12(2): 143-50. CTYA head: 1. No proximal intracranial large vessel occlusion identified. 2. Intracranial atherosclerotic disease with multifocal stenoses, most notably as follows. 3. Severe stenosis within the right middle cerebral artery distal M1 segment, progressed from the prior CTA of 07/26/2022. 4. Severe stenosis of the right posterior cerebral artery at the P3/P4 junction. 5. Up to moderate stenosis of the left posterior cerebral artery at the P3/P4 junction. Electronically Signed   By: Bascom Lily D.O.   On: 03/03/2024 14:17   CT HEAD CODE STROKE WO CONTRAST Result Date: 03/03/2024 CLINICAL DATA:  Code stroke. Neuro deficit, acute, stroke suspected. Abnormal speech. Increased weakness. EXAM: CT HEAD WITHOUT CONTRAST TECHNIQUE: Contiguous axial images were obtained from the base of the skull through the vertex without intravenous contrast. RADIATION DOSE REDUCTION: This exam was performed according to the departmental dose-optimization program which includes automated exposure control, adjustment of the mA and/or kV according to Kara Bailey size and/or use of iterative reconstruction technique. COMPARISON:  Brain MRI 12/01/2022. FINDINGS: Brain: Generalized cerebral and cerebellar atrophy. 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 Background mild patchy and ill-defined hypoattenuation within the cerebral white matter, nonspecific but compatible chronic small vessel ischemic disease. There is no acute intracranial hemorrhage. No demarcated cortical infarct. No extra-axial fluid collection. No evidence of an intracranial mass. No  midline shift. Vascular: No hyperdense vessel.  Atherosclerotic calcifications. Skull: No calvarial fracture or aggressive osseous lesion. Sinuses/Orbits: No mass or acute finding within the imaged orbits. No significant paranasal sinus disease at the imaged levels. ASPECTS [facility] Stroke Program Early  CT Score) - Ganglionic level infarction (caudate, lentiform nuclei, internal capsule, insula, M1-M3 cortex): 7 - Supraganglionic infarction (M4-M6 cortex): 3 Total score (0-10 with 10 being normal): 10 (when discounting the chronic infarcts described above). No evidence of an acute intracranial abnormality. These results were communicated to Dr. Janett Medin at 1:00 pmon 4/22/2025by text page via the [facility] messaging system. IMPRESSION: 1.  No evidence of an acute intracranial abnormality. 2. Parenchymal atrophy, chronic small vessel ischemic disease and chronic infarcts, as described. Electronically Signed   By: Bascom Lily D.O.   On: 03/03/2024 13:01    Microbiology: Results for orders placed or performed during the hospital encounter of 03/03/24  Resp panel by RT-PCR (RSV, Flu A&B, Covid) Anterior Nasal Swab     Status: None   Collection Time: 03/03/24  3:45 PM   Specimen: Anterior Nasal Swab  Result Value Ref Range Status   SARS Coronavirus 2 by RT PCR NEGATIVE NEGATIVE Final    Comment: (NOTE) SARS-CoV-2 target nucleic acids are NOT DETECTED.  The SARS-CoV-2 RNA is generally detectable in upper respiratory specimens during the acute phase of infection. The lowest concentration of SARS-CoV-2 viral copies this assay can detect is 138 copies/mL. A negative result does not preclude SARS-Cov-2 infection and should not be used as the sole basis for treatment or other Kara Bailey management decisions. A negative result may occur with  improper specimen collection/handling, submission of specimen other than nasopharyngeal swab, presence of viral mutation(s) within the areas targeted by this assay, and  inadequate number of viral copies(<138 copies/mL). A negative result must be combined with clinical observations, Kara Bailey history, and epidemiological information. The expected result is Negative.  Fact Sheet for Patients:  BloggerCourse.com  Fact Sheet for Healthcare Providers:  SeriousBroker.it  This test is no t yet approved or cleared by the [place]  FDA and  has been authorized for detection and/or diagnosis of SARS-CoV-2 by FDA under an Emergency Use Authorization (EUA). This EUA will remain  in effect (meaning this test can be used) for the duration of the COVID-19 declaration under Section 564(b)(1) of the Act, 21 U.S.C.section 360bbb-3(b)(1), unless the authorization is terminated  or revoked sooner.       Influenza A by PCR NEGATIVE NEGATIVE Final   Influenza B by PCR NEGATIVE NEGATIVE Final    Comment: (NOTE) The Xpert Xpress SARS-CoV-2/FLU/RSV plus assay is intended as an aid in the diagnosis of influenza from Nasopharyngeal swab specimens and should not be used as a sole basis for treatment. Nasal washings and aspirates are unacceptable for Xpert Xpress SARS-CoV-2/FLU/RSV testing.  Fact Sheet for Patients: BloggerCourse.com  Fact Sheet for Healthcare Providers: SeriousBroker.it  This test is not yet approved or cleared by the [place]  FDA and has been authorized for detection and/or diagnosis of SARS-CoV-2 by FDA under an Emergency Use Authorization (EUA). This EUA will remain in effect (meaning this test can be used) for the duration of the COVID-19 declaration under Section 564(b)(1) of the Act, 21 U.S.C. section 360bbb-3(b)(1), unless the authorization is terminated or revoked.     Resp Syncytial Virus by PCR NEGATIVE NEGATIVE Final    Comment: (NOTE) Fact Sheet for Patients: BloggerCourse.com  Fact Sheet for Healthcare  Providers: SeriousBroker.it  This test is not yet approved or cleared by the [place]  FDA and has been authorized for detection and/or diagnosis of SARS-CoV-2 by FDA under an Emergency Use Authorization (EUA). This EUA will remain in effect (meaning this test can be used) for the duration of  the COVID-19 declaration under Section 564(b)(1) of the Act, 21 U.S.C. section 360bbb-3(b)(1), unless the authorization is terminated or revoked.  Performed at [facility], [address], [place], [place] [address]     Labs: CBC: Recent Labs  Lab 03/31/24 1422 04/01/24 0124  WBC 11.6* 11.1*  NEUTROABS 8.0*  --   HGB 13.1 12.5  HCT 41.8 38.8  MCV 96.5 96.5  PLT 330 328   Basic Metabolic Panel: Recent Labs  Lab 03/31/24 1422 04/01/24 0124  NA 139 136  K 3.2* 3.5  CL 104 108  CO2 26 22  GLUCOSE 89 76  BUN 10 8  CREATININE 0.84 0.77  CALCIUM  10.1 8.9   Liver Function Tests: Recent Labs  Lab 03/31/24 1422  AST 16  ALT 11  ALKPHOS 81  BILITOT 0.7  PROT 6.9  ALBUMIN 3.5   CBG: Recent Labs  Lab 03/31/24 1354  GLUCAP 87    Discharge time spent: greater than 30 minutes.  Signed: Wynetta Heckle, MD [facility] 04/01/2024

## 2024-04-02 LAB — THYROTROPIN RECEPTOR AUTOABS: Thyrotropin Receptor Ab: <1.1 IU/L (ref 0.00–1.75)

## 2024-04-02 LAB — AFP TUMOR MARKER: AFP, Serum, Tumor Marker: 2.4 ng/mL (ref 0.0–9.2)

## 2024-04-03 LAB — URINE CULTURE: Culture: >=100000 — AB

## 2024-04-03 LAB — T3, FREE: T3, Free: 3.4 pg/mL (ref 2.0–4.4)

## 2024-04-16 ENCOUNTER — Emergency Department (HOSPITAL_COMMUNITY)

## 2024-04-16 ENCOUNTER — Other Ambulatory Visit: Payer: Self-pay

## 2024-04-16 ENCOUNTER — Emergency Department (HOSPITAL_COMMUNITY)
Admission: EM | Admit: 2024-04-16 | Discharge: 2024-04-16 | Disposition: A | Discharge status: Home / Self Care | Attending: Emergency Medicine | Admitting: Emergency Medicine

## 2024-04-16 DIAGNOSIS — I10 Essential (primary) hypertension: Secondary | ICD-10-CM | POA: Diagnosis not present

## 2024-04-16 DIAGNOSIS — Z7901 Long term (current) use of anticoagulants: Secondary | ICD-10-CM | POA: Diagnosis not present

## 2024-04-16 DIAGNOSIS — R1084 Generalized abdominal pain: Secondary | ICD-10-CM | POA: Diagnosis present

## 2024-04-16 DIAGNOSIS — F17210 Nicotine dependence, cigarettes, uncomplicated: Secondary | ICD-10-CM | POA: Diagnosis not present

## 2024-04-16 DIAGNOSIS — Z7982 Long term (current) use of aspirin: Secondary | ICD-10-CM | POA: Diagnosis not present

## 2024-04-16 LAB — URINALYSIS, W/ REFLEX TO CULTURE (INFECTION SUSPECTED)
Bilirubin Urine: NEGATIVE
Glucose, UA: NEGATIVE mg/dL
Hgb urine dipstick: NEGATIVE
Ketones, ur: NEGATIVE mg/dL
Nitrite: NEGATIVE
Protein, ur: NEGATIVE mg/dL
Specific Gravity, Urine: >1.046 — ABNORMAL HIGH (ref 1.005–1.030)
pH: 5 (ref 5.0–8.0)

## 2024-04-16 LAB — CBC WITH DIFFERENTIAL/PLATELET
Abs Immature Granulocytes: 0.02 10*3/uL (ref 0.00–0.07)
Basophils Absolute: 0.1 10*3/uL (ref 0.0–0.1)
Basophils Relative: 1 %
Eosinophils Absolute: 1.1 10*3/uL — ABNORMAL HIGH (ref 0.0–0.5)
Eosinophils Relative: 14 %
HCT: 37.7 % (ref 36.0–46.0)
Hemoglobin: 12 g/dL (ref 12.0–15.0)
Immature Granulocytes: 0 %
Lymphocytes Relative: 23 %
Lymphs Abs: 1.7 10*3/uL (ref 0.7–4.0)
MCH: 30.5 pg (ref 26.0–34.0)
MCHC: 31.8 g/dL (ref 30.0–36.0)
MCV: 95.9 fL (ref 80.0–100.0)
Monocytes Absolute: 1 10*3/uL (ref 0.1–1.0)
Monocytes Relative: 13 %
Neutro Abs: 3.7 10*3/uL (ref 1.7–7.7)
Neutrophils Relative %: 49 %
Platelets: 267 10*3/uL (ref 150–400)
RBC: 3.93 MIL/uL (ref 3.87–5.11)
RDW: 13.1 % (ref 11.5–15.5)
WBC: 7.6 10*3/uL (ref 4.0–10.5)
nRBC: 0 % (ref 0.0–0.2)

## 2024-04-16 LAB — COMPREHENSIVE METABOLIC PANEL WITH GFR
ALT: 13 U/L (ref 0–44)
AST: 21 U/L (ref 15–41)
Albumin: 3.2 g/dL — ABNORMAL LOW (ref 3.5–5.0)
Alkaline Phosphatase: 71 U/L (ref 38–126)
Anion gap: 5 (ref 5–15)
BUN: 14 mg/dL (ref 8–23)
CO2: 24 mmol/L (ref 22–32)
Calcium: 9.5 mg/dL (ref 8.9–10.3)
Chloride: 111 mmol/L (ref 98–111)
Creatinine, Ser: 0.97 mg/dL (ref 0.44–1.00)
GFR, Estimated: >60 mL/min (ref >60)
Glucose, Bld: 94 mg/dL (ref 70–99)
Potassium: 3.8 mmol/L (ref 3.5–5.1)
Sodium: 140 mmol/L (ref 135–145)
Total Bilirubin: 0.6 mg/dL (ref 0.0–1.2)
Total Protein: 6.4 g/dL — ABNORMAL LOW (ref 6.5–8.1)

## 2024-04-16 LAB — LIPASE, BLOOD: Lipase: 31 U/L (ref 11–51)

## 2024-04-16 LAB — CBG MONITORING, ED: Glucose-Capillary: 85 mg/dL (ref 70–99)

## 2024-04-16 MED ORDER — SODIUM CHLORIDE 0.9 % IV BOLUS
1000.0000 mL | Freq: Once | INTRAVENOUS | Status: AC
  Administered 2024-04-16: 1000 mL via INTRAVENOUS

## 2024-04-16 MED ORDER — IOHEXOL 300 MG/ML  SOLN
100.0000 mL | Freq: Once | INTRAMUSCULAR | Status: AC | PRN
  Administered 2024-04-16: 100 mL via INTRAVENOUS

## 2024-04-16 MED ORDER — HYDROMORPHONE HCL 1 MG/ML IJ SOLN
0.5000 mg | Freq: Once | INTRAMUSCULAR | Status: AC
  Administered 2024-04-16: 0.5 mg via INTRAVENOUS
  Filled 2024-04-16: qty 0.5

## 2024-04-16 NOTE — ED Notes (Signed)
 Patient transported to CT

## 2024-04-16 NOTE — ED Triage Notes (Signed)
 Pt. BIB [facility] from [facility]. Staff there reported generalized weakness and slurred speech that is "new." Pt. Notes her speech has not changed. Pt. Has history of stroke and left sided weakness. B/P with EMS was 122/80 and CBG was 104. Pt. Is alert and oriented.

## 2024-04-16 NOTE — ED Provider Notes (Signed)
 [place] [facility]ERGENC[facility] DE[facility]MENT AT [facility] Provider Note  CSN: [id number] Arrival date & time: 04/16/24 1628  Chief Complaint(s) Weakness  HPI DESHEA POOLEY is a 68 y.o. female year prior stroke with residual left-sided hemiparesis presenting to the emergency department with abdominal pain, generalized weakness.  Apparently there was some concern that the patient was having slurred speech however the patient has chronic slurred speech due to prior stroke.  Patient denies any change in her chronic weakness and slurred speech.  She reports that she has been having abdominal pain diffusely over the past few days.  She reports associated nausea, 1 episode of vomiting a few days ago.  Was recently treated for UTI and reports that she has been feeling better as far as UTI symptoms.  Denies fevers or chills.  Denies any diarrhea.   Past Medical History Past Medical History:  Diagnosis Date   Acute CVA (cerebrovascular accident) (HCC) 12/29/2020   Anxiety    Chest pain 08/2014   unspecified   Cirrhosis (HCC)    CVA (cerebral vascular accident) (HCC) 07/27/2022   Depression    Dizziness 08/2014   Dysphagia following cerebral infarction    Dyspnea 08/2014   Dysrhythmia    Emphysema lung (HCC)    GERD (gastroesophageal reflux disease)    Heart murmur    Hemiplegia, unspecified affecting left nondominant side (HCC)    Hepatitis C    Hiatal hernia    Hyperlipidemia    Hypertension    Insomnia    Ischemic stroke (HCC) 08/01/2022   Muscle weakness (generalized)    Other muscle spasm    PAC (premature atrial contraction) 10/11/2014   PAD (peripheral artery disease) (HCC)    Palpitations    PVC (premature ventricular contraction) 10/11/2014   PVD (peripheral vascular disease) (HCC)    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   Stroke (HCC)    just slurred speech   Tachycardia 08/2014   Ventral hernia    Weakness 08/2014   Patient Active Problem List    Diagnosis Date Noted   UTI (urinary tract infection) 03/31/2024   Liver mass 03/31/2024   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   Acute metabolic encephalopathy 03/03/2024   Generalized weakness 03/03/2024   Hemiparesis affecting left side as late effect of cerebrovascular accident (HCC) 02/18/2024   Dysarthria 02/18/2024   Decreased hemoglobin 12/28/2022   Heme positive stool 12/28/2022   Protein-calorie malnutrition, severe 12/04/2022   History of stroke 11/29/2022   Dysphagia as late effect of cerebrovascular accident (CVA) 11/29/2022   Stroke (HCC) 07/26/2022   Left hemiplegia (HCC) 07/26/2022   Peripheral arterial disease (HCC) 12/20/2020   History of esophageal stricture 07/08/2020   Insomnia 12/02/2017   Chronic cough 08/14/2017   Poor appetite 08/29/2016   Peptic stricture of esophagus    History of colonic polyps    Hiatal hernia 03/16/2016   Dysphagia 03/16/2016   Alcohol abuse 10/26/2015   Essential hypertension 09/26/2015   PAC (premature atrial contraction) 10/11/2014   PVC (premature ventricular contraction) 10/11/2014   Back muscle spasm 06/15/2014   Generalized anxiety disorder 06/15/2014   Smoking 06/15/2014   Chronic hepatitis C without hepatic coma (HCC) 05/28/2013   Home Medication(s) Prior to Admission medications   Medication Sig Start Date End Date Taking? Authorizing Provider  acetaminophen  (TYLENOL ) 325 MG tablet Take 2 tablets (650 mg total) by mouth every 6 (six) hours as needed for mild pain (pain score 1-3), fever, headache or moderate pain (pain score 4-6). 03/05/24  Johnson, Clanford L, MD  albuterol  (VENTOLIN  HFA) 108 (90 Base) MCG/ACT inhaler Inhale 2 puffs into the lungs every 6 (six) hours as needed for wheezing or shortness of breath.    [provider]  ALPRAZolam  (XANAX ) 0.5 MG tablet Take 1 tablet (0.5 mg total) by mouth 3 (three) times daily. 04/01/24   Wynetta Heckle, MD  aspirin  EC 81 MG tablet Take 81 mg by mouth daily. Swallow whole.     [provider]  atorvastatin  (LIPITOR ) 80 MG tablet Take 1 tablet (80 mg total) by mouth every evening. 03/05/24   Johnson, Clanford L, MD  busPIRone  (BUSPAR ) 5 MG tablet Take 1 tablet (5 mg total) by mouth 3 (three) times daily. 03/05/24   Johnson, Clanford L, MD  Calcium  Carb-Cholecalciferol  (CALCIUM  1000 + D) 1000-20 MG-MCG TABS Take 1 tablet by mouth daily. 05/02/23   [provider]  clopidogrel  (PLAVIX ) 75 MG tablet Take 1 tablet (75 mg total) by mouth daily. 12/20/22   Avanell Leigh, MD  ezetimibe  (ZETIA ) 10 MG tablet Take 10 mg by mouth daily.    [provider]  hydroxypropyl methylcellulose / hypromellose (ISOPTO TEARS / GONIOVISC) 2.5 % ophthalmic solution Place 1 drop into both eyes in the morning and at bedtime.    [provider]  mirtazapine  (REMERON ) 7.5 MG tablet Take 7.5 mg by mouth at bedtime.    [provider]  mycophenolate  (CELLCEPT ) 500 MG tablet Take 1,000 mg by mouth daily.    [provider]  Nutritional Supplement LIQD Take 120 mLs by mouth daily. Listed as House Supplement in [facility]    [provider]  ondansetron  (ZOFRAN -ODT) 4 MG disintegrating tablet Take 4 mg by mouth every 8 (eight) hours as needed for nausea or vomiting.    [provider]  oxycodone  (OXY-IR) 5 MG capsule Take 1 capsule (5 mg total) by mouth every 6 (six) hours as needed (moderate to severe pain). 04/01/24   Wynetta Heckle, MD  polyethylene glycol (MIRALAX  / GLYCOLAX ) 17 g packet Take 17 g by mouth daily as needed. Patient taking differently: Take 17 g by mouth as needed for mild constipation. 09/06/22   Setzer, Hamp Levine, PA-C  thiamine  (VITAMIN B-1) 100 MG tablet Take 100 mg by mouth daily.    [provider]                                                                                                                                    Past Surgical History Past Surgical History:  Procedure Laterality Date   ABDOMINAL  AORTOGRAM W/LOWER EXTREMITY N/A 07/13/2021   Procedure: ABDOMINAL AORTOGRAM W/LOWER EXTREMITY;  Surgeon: Avanell Leigh, MD;  Location: [facility] INVASIVE CV LAB;  Service: Cardiovascular;  Laterality: N/A;   ABDOMINAL AORTOGRAM W/LOWER EXTREMITY N/A 03/19/2022   Procedure: ABDOMINAL AORTOGRAM W/LOWER EXTREMITY;  Surgeon: Avanell Leigh, MD;  Location: [facility] INVASIVE CV LAB;  Service: Cardiovascular;  Laterality: N/A;   APPENDECTOMY     CATARACT EXTRACTION, BILATERAL     COLONOSCOPY     COLONOSCOPY WITH PROPOFOL  N/A 04/30/2016   Procedure: COLONOSCOPY WITH PROPOFOL ;  Surgeon: Suzette Espy, MD;  Location: [facility] ENDO SUITE;  Service: Endoscopy;  Laterality: N/A;  1230   EPIGASTRIC HERNIA REPAIR N/A 10/13/2021   Procedure: OPEN REPAIR EPIGASTRIC HERNIA WITH MESH PATCH;  Surgeon: Oralee Billow, MD;  Location: [facility] ORS;  Service: General;  Laterality: N/A;   ESOPHAGOGASTRODUODENOSCOPY     approximately 2010   ESOPHAGOGASTRODUODENOSCOPY (EGD) WITH PROPOFOL  N/A 04/30/2016   Procedure: ESOPHAGOGASTRODUODENOSCOPY (EGD) WITH PROPOFOL ;  Surgeon: Suzette Espy, MD;  Location: [facility] ENDO SUITE;  Service: Endoscopy;  Laterality: N/A;   ESOPHAGOGASTRODUODENOSCOPY (EGD) WITH PROPOFOL  N/A 12/28/2022   Procedure: ESOPHAGOGASTRODUODENOSCOPY (EGD) WITH PROPOFOL ;  Surgeon: Kenney Peacemaker, MD;  Location: [facility] ENDOSCOPY;  Service: Gastroenterology;  Laterality: N/A;   IR GASTROSTOMY TUBE MOD SED  12/03/2022   IR GASTROSTOMY TUBE REMOVAL  09/12/2023   LEFT HEART CATHETERIZATION WITH CORONARY ANGIOGRAM N/A 10/29/2014   07-14-20- pt denies this Procedure: LEFT HEART CATHETERIZATION WITH CORONARY ANGIOGRAM;  Surgeon: Odie Benne, MD;  Location: [facility] CATH LAB;  Service: Cardiovascular;  Laterality: N/A;   MALONEY DILATION N/A 04/30/2016   Procedure: Londa Rival DILATION;  Surgeon: Suzette Espy, MD;  Location: [facility] ENDO SU[facility];  Service: Endoscopy;  Laterality: N/A;   PERIPHERAL VASCULAR INTERVENTION  07/13/2021   Procedure: PERIPHERAL  VASCULAR INTERVENTION;  Surgeon: Avanell Leigh, MD;  Location: [facility] INVASIVE CV LAB;  Service: Cardiovascular;;  left SFA left external iliac   PERIPHERAL VASCULAR INTERVENTION Right 03/19/2022   Procedure: PERIPHERAL VASCULAR INTERVENTION;  Surgeon: Avanell Leigh, MD;  Location: [facility] INVASIVE CV LAB;  Service: Cardiovascular;  Laterality: Right;  SFA   POLYPECTOMY  04/30/2016   Procedure: POLYPECTOMY;  Surgeon: Suzette Espy, MD;  Location: [facility] ENDO SUITE;  Service: Endoscopy;;  Sigmoid colon polyp removed via hot snare   UPPER GASTROINTESTINAL ENDOSCOPY     Family History Family History  Problem Relation Age of Onset   Heart failure Mother    Heart attack Mother    Heart disease Mother    Hypertension Mother    Hyperlipidemia Mother    CVA Father    Alzheimer's disease Father    Cancer Neg Hx    Colon cancer Neg Hx    Esophageal cancer Neg Hx    Rectal cancer Neg Hx    Stomach cancer Neg Hx     Social History Social History   Tobacco Use   Smoking status: Former    Current packs/day: 0.50    Average packs/day: 0.5 packs/day for 55.4 years (27.7 ttl pk-yrs)    Types: Cigarettes    Start date: 11/12/1968   Smokeless tobacco: Never   Tobacco comments:    cutting back, wearing patches  Vaping Use   Vaping status: Never Used  Substance Use Topics   Alcohol use: Not Currently    Comment: beer on weekends (6-pack)   Drug use: No   Allergies Patient has no known allergies.  Review of Systems Review of Systems  All other systems reviewed and are negative.   Physical Exam Vital Signs  I have reviewed the triage vital signs BP 130/81 (BP Location: Right Arm)   Pulse 80   Temp 97.8 F (36.6 C) (Oral)   Resp 19   Ht 5\' 6"  (1.676 m)   Wt 62.6 kg  SpO2 98%   BMI 22.27 kg/m  Physical Exam Vitals and nursing note reviewed.  Constitutional:      General: She is not in acute distress.    Appearance: She is well-developed.  HENT:     Head: Normocephalic and  atraumatic.     Mouth/Throat:     Mouth: Mucous membranes are moist.  Eyes:     Pupils: Pupils are equal, round, and reactive to light.  Cardiovascular:     Rate and Rhythm: Normal rate and regular rhythm.     Heart sounds: No murmur heard. Pulmonary:     Effort: Pulmonary effort is normal. No respiratory distress.     Breath sounds: Normal breath sounds.  Abdominal:     General: Abdomen is flat.     Palpations: Abdomen is soft.     Tenderness: There is no abdominal tenderness.  Musculoskeletal:        General: No tenderness.     Right lower leg: No edema.     Left lower leg: No edema.  Skin:    General: Skin is warm and dry.  Neurological:     Mental Status: She is alert. Mental status is at baseline.     Comments: And oriented x 3, chronic left-sided hemiparesis and left facial droop, chronic slurred speech  Psychiatric:        Mood and Affect: Mood normal.        Behavior: Behavior normal.     ED Results and Treatments Labs (all labs ordered are listed, but only abnormal results are displayed) Labs Reviewed  COMPREHENSIVE METABOLIC PANEL WITH GFR - Abnormal; Notable for the following components:      Result Value   Total Protein 6.4 (*)    Albumin 3.2 (*)    All other components within normal limits  CBC WITH DIFFERENTIAL/PLATELET - Abnormal; Notable for the following components:   Eosinophils Absolute 1.1 (*)    All other components within normal limits  URINALYSIS, W/ REFLEX TO CULTURE (INFECTION SUSPECTED) - Abnormal; Notable for the following components:   Specific Gravity, Urine >1.046 (*)    Leukocytes,Ua SMALL (*)    Bacteria, UA RARE (*)    All other components within normal limits  LIPASE, BLOOD  CBG MONITORING, ED                                                                                                                          Radiology CT ABDOMEN PELVIS W CONTRAST Result Date: 04/16/2024 CLINICAL DATA:  Acute abdominal pain. EXAM: CT ABDOMEN AND  PELVIS WITH CONTRAST TECHNIQUE: Multidetector CT imaging of the abdomen and pelvis was performed using the standard protocol following bolus administration of intravenous contrast. RADIATION DOSE REDUCTION: This exam was performed according to the departmental dose-optimization program which includes automated exposure control, adjustment of the mA and/or kV according to patient size and/or use of iterative reconstruction technique. CONTRAST:  OMNIPAQUE  IOHEXOL  300 MG/ML  SOLN COMPARISON:  CT  abdomen and pelvis 03/31/2024. MRI abdomen 04/01/2024. FINDINGS: Lower chest: There is atelectasis in the left lung base with mild elevation of the left hemidiaphragm. Hepatobiliary: Hepatic hemangioma in the left lobe with characterized on prior examination and measures 19 mm, unchanged. No new liver lesions are seen. No gallstones are identified. Common bile duct is dilated measuring 1 cm which has increased in size from prior. Pancreas: Unremarkable. No pancreatic ductal dilatation or surrounding inflammatory changes. Spleen: Normal in size without focal abnormality. Adrenals/Urinary Tract: Adrenal glands are unremarkable. Kidneys are normal, without renal calculi, focal lesion, or hydronephrosis. Bladder is unremarkable. Stomach/Bowel: Stomach is within normal limits. Appendix is not seen. No evidence of bowel wall thickening, distention, or inflammatory changes. Vascular/Lymphatic: Aortic atherosclerosis. No enlarged abdominal or pelvic lymph nodes. Reproductive: Uterus and bilateral adnexa are unremarkable. Other: No abdominal wall hernia or abnormality. No abdominopelvic ascites. Musculoskeletal: The bones are diffusely osteopenic. No acute fractures are seen. IMPRESSION: 1. Common bile duct is dilated measuring 1 cm which has increased in size from prior. No gallstones are identified. Correlate clinically for biliary obstruction. 2. Stable hepatic hemangioma. 3. Aortic atherosclerosis. Aortic Atherosclerosis  (ICD10-I70.0). Electronically Signed   By: Tyron Gallon M.D.   On: 04/16/2024 19:31    Pertinent labs & imaging results that were available during my care of the patient were reviewed by me and considered in my medical decision making (see MDM for details).  Medications Ordered in ED Medications  sodium chloride  0.9 % bolus 1,000 mL (0 mLs Intravenous Stopped 04/16/24 1941)  HYDROmorphone  (DILAUDID ) injection 0.5 mg (0.5 mg Intravenous Given 04/16/24 1838)  iohexol  (OMNIPAQUE ) 300 MG/ML solution 100 mL (100 mLs Intravenous Contrast Given 04/16/24 1912)                                                                                                                                     Procedures Procedures  (including critical care time)  Medical Decision Making / ED Course   MDM:  68 year old presenting with weakness, abdominal pain.  Patient well-appearing, vitals reassuring.  On exam, she has chronic left hemiparesis which she reports is chronic.  I have evaluated patient previously and her neurologic exam seems similar to prior and she does not feel that she has had any acute change.  She does complain of abdominal pain, exam has diffuse abdominal tenderness.  Denies nausea or vomiting currently but reports that she has been having some nausea and vomiting recently.  Will check labs, obtain CT scan.  Differential includes obstruction, perforation, volvulus, pancreatitis, appendicitis, cholecystitis or other acute process.  Will treat pain.  If workup is reassuring anticipate discharge.  Clinical Course as of 04/17/24 1655  Thu Apr 16, 2024  2300 Workup is reassuring. Discussed with patient who understands. Will discharge back to her rehab facility. Patient comfortable with plan.  [WS]    Clinical Course User Index [WS] Mordecai Applebaum, MD  Additional history obtained: -Additional history obtained from ems -External records from outside source obtained and reviewed including:  Chart review including previous notes, labs, imaging, consultation notes including prior notes    Lab Tests: -I ordered, reviewed, and interpreted labs.   The pertinent results include:   Labs Reviewed  COMPREHENSIVE METABOLIC PANEL WITH GFR - Abnormal; Notable for the following components:      Result Value   Total Protein 6.4 (*)    Albumin 3.2 (*)    All other components within normal limits  CBC WITH DIFFERENTIAL/PLATELET - Abnormal; Notable for the following components:   Eosinophils Absolute 1.1 (*)    All other components within normal limits  URINALYSIS, W/ REFLEX TO CULTURE (INFECTION SUSPECTED) - Abnormal; Notable for the following components:   Specific Gravity, Urine >1.046 (*)    Leukocytes,Ua SMALL (*)    Bacteria, UA RARE (*)    All other components within normal limits  LIPASE, BLOOD  CBG MONITORING, ED    Notable for concentrated urine without urinary symptoms to suggest UTI      Imaging Studies ordered: I ordered imaging studies including CT abdomen On my interpretation imaging demonstrates no acute process I independently visualized and interpreted imaging. I agree with the radiologist interpretation   Medicines ordered and prescription drug management: Meds ordered this encounter  Medications   sodium chloride  0.9 % bolus 1,000 mL   HYDROmorphone  (DILAUDID ) injection 0.5 mg   iohexol  (OMNIPAQUE ) 300 MG/ML solution 100 mL    -I have reviewed the patients home medicines and have made adjustments as needed    Reevaluation: After the interventions noted above, I reevaluated the patient and found that their symptoms have improved  Co morbidities that complicate the patient evaluation  Past Medical History:  Diagnosis Date   Acute CVA (cerebrovascular accident) (HCC) 12/29/2020   Anxiety    Chest pain 08/2014   unspecified   Cirrhosis (HCC)    CVA (cerebral vascular accident) (HCC) 07/27/2022   Depression    Dizziness 08/2014   Dysphagia  following cerebral infarction    Dyspnea 08/2014   Dysrhythmia    Emphysema lung (HCC)    GERD (gastroesophageal reflux disease)    Heart murmur    Hemiplegia, unspecified affecting left nondominant side (HCC)    Hepatitis C    Hiatal hernia    Hyperlipidemia    Hypertension    Insomnia    Ischemic stroke (HCC) 08/01/2022   Muscle weakness (generalized)    Other muscle spasm    PAC (premature atrial contraction) 10/11/2014   PAD (peripheral artery disease) (HCC)    Palpitations    PVC (premature ventricular contraction) 10/11/2014   PVD (peripheral vascular disease) (HCC)    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    Final Clinical Impression(s) / ED Diagnoses Final diagnoses:  Generalized abdominal pain     This chart was dictated using voice recognition software.  Despite best efforts to proofread,  errors can occur which can change the documentation meaning.    Mordecai Applebaum, MD 04/17/24 1655

## 2024-04-16 NOTE — Discharge Instructions (Addendum)
 We evaluated you for your abdominal pain and weakness.  Your testing in the emergency department is reassuring.  Please return to the emergency department for any new or worsening symptoms.

## 2024-04-16 NOTE — ED Notes (Addendum)
 Pt has bandages on bilateral outer hip area, clean dry & intact from nursing facility. In & out cath completed per EDP to obtain urine specimen. Hand hygiene & peri care performed. Assisted by [facility] EDT. Pt tolerated well with no complaints. Stretcher returned to locked lowest position, with pt in position of comfort & call light in reach.

## 2024-06-18 ENCOUNTER — Encounter: Payer: Self-pay | Admitting: Neurology

## 2024-06-18 ENCOUNTER — Ambulatory Visit: Admitting: Neurology

## 2024-06-18 VITALS — BP 126/84 | HR 87

## 2024-06-18 DIAGNOSIS — I69354 Hemiplegia and hemiparesis following cerebral infarction affecting left non-dominant side: Secondary | ICD-10-CM | POA: Diagnosis not present

## 2024-06-18 MED ORDER — INCOBOTULINUMTOXINA 100 UNITS IM SOLR
500.0000 [IU] | INTRAMUSCULAR | Status: AC
Start: 2024-06-18 — End: ?
  Administered 2024-06-20: 500 [IU] via INTRAMUSCULAR

## 2024-06-18 NOTE — Progress Notes (Signed)
 xeomin 100units x 5 vial  [id number] [id number] Exp-2027/09 B/B  Bacteriostatic 0.9% Sodium Chloride - 10mL  Onu:[id number] Expiration: 09/11/25 NDC: [id number] Dx: P30.645  WITNESSED BY:a jones rn

## 2024-06-18 NOTE — Progress Notes (Signed)
 Chief Complaint  Patient presents with   Injections    Room 15, Pt is with care giver , Pt is agrees to injections      ASSESSMENT AND PLAN  Kara Bailey is a 68 y.o. 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  No antigravity movement of left upper, lower extremity, pain of left shoulder, and finger, limited range of motion with passive stretch,  Prior authorization of xeomin  500 units, return to clinic for electrical stimulation guided injection  Electrical stimulation guided guided Xeomin  injection, she has skin breakdown at the left forearm, was wrapped, used xeomin  500 units    Left pectoralis major 200 Left latissimus dorsi 200 Left flexor digitorum profundus 25 units Left flexor digitorum ulnaris 25 units Left flexor digitorum superficialis 25 units Left palmaris longus 25 units    DIAGNOSTIC DATA (LABS, IMAGING, TESTING) - I reviewed patient records, labs, notes, testing and imaging myself where available.   MEDICAL HISTORY:  Kara Bailey is a 68 year old female, seen in request by her primary care doctor [place], [place]  for evaluation of spastic left hemiparesis, botulism toxin injection, she is accompanied by her facility driver at today's visit February 18, 2024  History is obtained from the patient and review of electronic medical records. I personally reviewed pertinent available imaging films in PACS.   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, had a G-tube placement on December 03, 2021 History of esophageal stricture, dilated in 2020,  She had a history of left thalamic stroke, was on aspirin  and Plavix , presenting with worsening left facial droop, dysarthria, left-sided weakness on July 26, 2022, MRI of the brain demonstrated acute stroke at the right corona radiata and posterior lentiform, since stroke, she had a significant residual spastic left hemiparesis,  She lost the use of her left arm,  significant left finger pain, shoulder pain with passive stretch, no antigravity movement of left lower extremity, needing help transfer  CT angiogram of head and neck showed no large vessel disease  She later developed significant dysarthria, dysphagia required transient PEG tube placement December 03, 2022, and was discharged to nursing home  Echocardiogram in September 2023 ejection fraction more than 75%, hyperdynamic function of left ventricle, no regional wall motion abnormality  UPDATE May 7th 2025: She lives at nursing home, brought in by transportation today, has left arm in wrap due to skin break down, this is her first electrical stimulation guided injections for spastic left hemiparesis, injection sites are limited to left upper shoulder,   UPDATE August 9th 2025: She is brought in by nursing home transportation, denies significant improvement with previous injection, will repeat injection at this time, if she did not notice any improvement after this second trial, we will not continue  PHYSICAL EXAM:   Vitals:   06/18/24 1318  BP: 126/84  Pulse: 87  SpO2: 98%   NEUROLOGICAL EXAM:  MENTAL STATUS: Speech/cognition: Dysarthria, awake, alert, oriented to history taking and casual conversation CRANIAL NERVES: CN II: Visual fields are full to confrontation. Pupils are round equal and briskly reactive to light. CN III, IV, VI: extraocular movement are normal. No ptosis. CN V: Facial sensation is intact to light touch CN VII: Face is symmetric with normal eye closure  CN VIII: Hearing is normal to causal conversation. CN IX, X: Phonation is normal. CN XI: Head turning and shoulder shrug are intact  MOTOR: Spastic left hemiparesis, no antigravity movement of left upper or  lower extremity, left hand form in finger flexion, with passive stretch, finger can reach full range of motion, tight left shoulder, complains of pain with passive stretch, preserved range of motion of left  elbow, tendency for left ankle plantarflexion  REFLEXES: Hyperreflexia of left upper and lower extremity  SENSORY: Intact to light touch, pinprick and vibratory sensation are intact in fingers and toes.  COORDINATION: There is no trunk or limb dysmetria noted.  GAIT/STANCE: Deferred  REVIEW OF SYSTEMS:  Full 14 system review of systems performed and notable only for as above All other review of systems were negative.   ALLERGIES: No Known Allergies  HOME MEDICATIONS: Current Outpatient Medications  Medication Sig Dispense Refill   acetaminophen  (TYLENOL ) 325 MG tablet Take 2 tablets (650 mg total) by mouth every 6 (six) hours as needed for mild pain (pain score 1-3), fever, headache or moderate pain (pain score 4-6).     albuterol  (VENTOLIN  HFA) 108 (90 Base) MCG/ACT inhaler Inhale 2 puffs into the lungs every 6 (six) hours as needed for wheezing or shortness of breath.     ALPRAZolam  (XANAX ) 0.5 MG tablet Take 1 tablet (0.5 mg total) by mouth 3 (three) times daily. 9 tablet 0   aspirin  EC 81 MG tablet Take 81 mg by mouth daily. Swallow whole.     atorvastatin  (LIPITOR ) 80 MG tablet Take 1 tablet (80 mg total) by mouth every evening.     busPIRone  (BUSPAR ) 5 MG tablet Take 1 tablet (5 mg total) by mouth 3 (three) times daily.     Calcium  Carb-Cholecalciferol  (CALCIUM  1000 + D) 1000-20 MG-MCG TABS Take 1 tablet by mouth daily.     clopidogrel  (PLAVIX ) 75 MG tablet Take 1 tablet (75 mg total) by mouth daily. 90 tablet 0   ezetimibe  (ZETIA ) 10 MG tablet Take 10 mg by mouth daily.     hydroxypropyl methylcellulose / hypromellose (ISOPTO TEARS / GONIOVISC) 2.5 % ophthalmic solution Place 1 drop into both eyes in the morning and at bedtime.     mirtazapine  (REMERON ) 7.5 MG tablet Take 7.5 mg by mouth at bedtime.     mycophenolate  (CELLCEPT ) 500 MG tablet Take 1,000 mg by mouth daily.     Nutritional Supplement LIQD Take 120 mLs by mouth daily. Listed as House Supplement in [facility]      ondansetron  (ZOFRAN -ODT) 4 MG disintegrating tablet Take 4 mg by mouth every 8 (eight) hours as needed for nausea or vomiting.     oxycodone  (OXY-IR) 5 MG capsule Take 1 capsule (5 mg total) by mouth every 6 (six) hours as needed (moderate to severe pain). 12 capsule 0   polyethylene glycol (MIRALAX  / GLYCOLAX ) 17 g packet Take 17 g by mouth daily as needed. (Patient taking differently: Take 17 g by mouth as needed for mild constipation.) 14 each 0   thiamine  (VITAMIN B-1) 100 MG tablet Take 100 mg by mouth daily.     Current Facility-Administered Medications  Medication Dose Route Frequency Provider Last Rate Last Admin   incobotulinumtoxinA  (XEOMIN ) 100 units injection 500 Units  500 Units Intramuscular Q90 days        incobotulinumtoxinA  (XEOMIN ) 100 units injection 500 Units  500 Units Intramuscular Q90 days Onita Duos, MD        PAST MEDICAL HISTORY: Past Medical History:  Diagnosis Date   Acute CVA (cerebrovascular accident) (HCC) 12/29/2020   Anxiety    Chest pain 08/2014   unspecified   Cirrhosis (HCC)    CVA (cerebral vascular  accident) (HCC) 07/27/2022   Depression    Dizziness 08/2014   Dysphagia following cerebral infarction    Dyspnea 08/2014   Dysrhythmia    Emphysema lung (HCC)    GERD (gastroesophageal reflux disease)    Heart murmur    Hemiplegia, unspecified affecting left nondominant side (HCC)    Hepatitis C    Hiatal hernia    Hyperlipidemia    Hypertension    Insomnia    Ischemic stroke (HCC) 08/01/2022   Muscle weakness (generalized)    Other muscle spasm    PAC (premature atrial contraction) 10/11/2014   PAD (peripheral artery disease) (HCC)    Palpitations    PVC (premature ventricular contraction) 10/11/2014   PVD (peripheral vascular disease) (HCC)    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   Stroke (HCC)    just slurred speech   Tachycardia 08/2014   Ventral hernia    Weakness 08/2014    PAST SURGICAL HISTORY: Past Surgical  History:  Procedure Laterality Date   ABDOMINAL AORTOGRAM W/LOWER EXTREMITY N/A 07/13/2021   Procedure: ABDOMINAL AORTOGRAM W/LOWER EXTREMITY;  Surgeon: Court Dorn PARAS, MD;  Location: [facility];  Service: Cardiovascular;  Laterality: N/A;   ABDOMINAL AORTOGRAM W/LOWER EXTREMITY N/A 03/19/2022   Procedure: ABDOMINAL AORTOGRAM W/LOWER EXTREMITY;  Surgeon: Court Dorn PARAS, MD;  Location: [facility];  Service: Cardiovascular;  Laterality: N/A;   APPENDECTOMY     CATARACT EXTRACTION, BILATERAL     COLONOSCOPY     COLONOSCOPY WITH PROPOFOL  N/A 04/30/2016   Procedure: COLONOSCOPY WITH PROPOFOL ;  Surgeon: Lamar CHRISTELLA Hollingshead, MD;  Location: [facility];  Service: Endoscopy;  Laterality: N/A;  1230   EPIGASTRIC HERNIA REPAIR N/A 10/13/2021   Procedure: OPEN REPAIR EPIGASTRIC HERNIA WITH MESH PATCH;  Surgeon: Eletha Boas, MD;  Location: [facility];  Service: General;  Laterality: N/A;   ESOPHAGOGASTRODUODENOSCOPY     approximately 2010   ESOPHAGOGASTRODUODENOSCOPY (EGD) WITH PROPOFOL  N/A 04/30/2016   Procedure: ESOPHAGOGASTRODUODENOSCOPY (EGD) WITH PROPOFOL ;  Surgeon: Lamar CHRISTELLA Hollingshead, MD;  [facility]: [facility];  Service: Endoscopy;  Laterality: N/A;   ESOPHAGOGASTRODUODENOSCOPY (EGD) WITH PROPOFOL  N/A 12/28/2022   Procedure: ESOPHAGOGASTRODUODENOSCOPY (EGD) WITH PROPOFOL ;  Surgeon: Avram Lupita BRAVO, MD;  Location: [facility];  Service: Gastroenterology;  Laterality: N/A;   IR GASTROSTOMY TUBE MOD SED  12/03/2022   IR GASTROSTOMY TUBE REMOVAL  09/12/2023   LEFT HEART CATHETERIZATION WITH CORONARY ANGIOGRAM N/A 10/29/2014   07-14-20- pt denies this Procedure: LEFT HEART CATHETERIZATION WITH CORONARY ANGIOGRAM;  Surgeon: Lonni JONETTA Cash, MD;  Location: [facility] CATH [facility];  Service: Cardiovascular;  Laterality: N/A;   MALONEY DILATION N/A 04/30/2016   Procedure: AGAPITO DILATION;  Surgeon: Lamar CHRISTELLA Hollingshead, MD;  [facility]: [facility];  Service: Endoscopy;  Laterality: N/A;   PERIPHERAL VASCULAR  INTERVENTION  07/13/2021   Procedure: PERIPHERAL VASCULAR INTERVENTION;  Surgeon: Court Dorn PARAS, MD;  [facility];  Service: Cardiovascular;;  left SFA left external iliac   PERIPHERAL VASCULAR INTERVENTION Right 03/19/2022   Procedure: PERIPHERAL VASCULAR INTERVENTION;  Surgeon: Court Dorn PARAS, MD;  [facility]: [facility];  Service: Cardiovascular;  Laterality: Right;  SFA   POLYPECTOMY  04/30/2016   Procedure: POLYPECTOMY;  Surgeon: Lamar CHRISTELLA Hollingshead, MD;  [facility];  Service: Endoscopy;;  Sigmoid colon polyp removed via hot snare   UPPER GASTROINTESTINAL ENDOSCOPY      FAMILY HISTORY: Family History  Problem Relation Age of Onset   Heart failure Mother  Heart attack Mother    Heart disease Mother    Hypertension Mother    Hyperlipidemia Mother    CVA Father    Alzheimer's disease Father    Cancer Neg Hx    Colon cancer Neg Hx    Esophageal cancer Neg Hx    Rectal cancer Neg Hx    Stomach cancer Neg Hx     SOCIAL HISTORY: Social History   Socioeconomic History   Marital status: Divorced    Spouse name: Not on file   Number of children: 2   Years of education: Not on file   Highest education level: Not on file  Occupational History   Occupation: retired  Tobacco Use   Smoking status: Former    Current packs/day: 0.50    Average packs/day: 0.5 packs/day for 55.6 years (27.8 ttl pk-yrs)    Types: Cigarettes    Start date: 11/12/1968   Smokeless tobacco: Never   Tobacco comments:    cutting back, wearing patches  Vaping Use   Vaping status: Never Used  Substance and Sexual Activity   Alcohol use: Not Currently    Comment: beer on weekends (6-pack)   Drug use: No   Sexual activity: Not Currently  Other Topics Concern   Not on file  Social History Narrative   Not on file   Social Drivers of Health   Financial Resource Strain: Not on file  Food Insecurity: No Food Insecurity (03/31/2024)   Hunger Vital Sign    Worried About  Running Out of Food in the Last Year: Never true    Ran Out of Food in the Last Year: Never true  Transportation Needs: No Transportation Needs (03/31/2024)   PRAPARE - Administrator, Civil Service (Medical): No    Lack of Transportation (Non-Medical): No  Physical Activity: Not on file  Stress: Not on file  Social Connections: Socially Isolated (03/31/2024)   Social Connection and Isolation Panel    Frequency of Communication with Friends and Family: Twice a week    Frequency of Social Gatherings with Friends and Family: Never    Attends Religious Services: Never    Database administrator or Organizations: No    Attends Banker Meetings: Never    Marital Status: Divorced  Catering manager Violence: Not At Risk (03/31/2024)   Humiliation, Afraid, Rape, and Kick questionnaire    Fear of Current or Ex-Partner: No    Emotionally Abused: No    Physically Abused: No    Sexually Abused: No      Modena Callander, M.D. Ph.D.  [facility] [address], [address] [place], [place] [address] Ph: [phone] Fax: [phone]  CC:  Tobie Belton, DO [address] [place],  [place] [address]  Tobie Belton, DO

## 2024-06-20 DIAGNOSIS — I69354 Hemiplegia and hemiparesis following cerebral infarction affecting left non-dominant side: Secondary | ICD-10-CM | POA: Diagnosis not present

## 2024-09-30 ENCOUNTER — Ambulatory Visit: Admitting: Neurology

## 2024-11-03 ENCOUNTER — Telehealth: Payer: Self-pay | Admitting: Neurology

## 2024-11-03 NOTE — Telephone Encounter (Signed)
 Submitted auth request to transition to SP- status is pending. Key: [id number]

## 2024-11-04 NOTE — Telephone Encounter (Signed)
 Kara Bailey

## 2024-11-09 ENCOUNTER — Other Ambulatory Visit: Payer: Self-pay | Admitting: *Deleted

## 2024-11-09 MED ORDER — XEOMIN 100 UNITS IM SOLR: 500.0000 [IU] | INTRAMUSCULAR | 1 refills | Status: AC

## 2024-11-09 NOTE — Addendum Note (Signed)
 Addended by: NEYSA NENA RAMAN on: 11/09/2024 05:03 PM   Modules accepted: Orders

## 2024-11-09 NOTE — Telephone Encounter (Signed)
 Please send rx to [facility], thank you!  Auth#: [id number] (11/04/24-11/11/25)

## 2024-12-01 NOTE — Telephone Encounter (Signed)
 LVM x2 asking pt to call [facility] to give consent to ship.

## 2024-12-02 NOTE — Telephone Encounter (Addendum)
 I called and spoke with pt, she states she didn't receive my voicemails. I explained the SP procedure to the patient and told her about how she would need to call them for her consent. She seemed confused and kept repeating that she can't make outgoing calls with her phone. I asked if someone could call for her and she said no.   Per Angie we can leave her as BB at least for this visit. I submitted urgent PA request to [facility] to change back to BB, auth was approved.   Auth#: [id number] (12/02/24-11/11/25)

## 2024-12-03 ENCOUNTER — Ambulatory Visit: Admitting: Neurology

## 2024-12-03 VITALS — BP 126/76 | HR 98

## 2024-12-03 DIAGNOSIS — I69354 Hemiplegia and hemiparesis following cerebral infarction affecting left non-dominant side: Secondary | ICD-10-CM | POA: Diagnosis not present

## 2024-12-03 DIAGNOSIS — R471 Dysarthria and anarthria: Secondary | ICD-10-CM

## 2024-12-03 MED ORDER — INCOBOTULINUMTOXINA 100 UNITS IM SOLR
500.0000 [IU] | INTRAMUSCULAR | Status: AC
  Administered 2024-12-03: 500 [IU] via INTRAMUSCULAR

## 2024-12-03 NOTE — Progress Notes (Signed)
 "  Chief Complaint  Patient presents with   Follow-up    Pt in room 14.alone. Here for xeomin  injection for hemiparesis left side.      ASSESSMENT AND PLAN  Kara Bailey is a 69 y.o. 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  No antigravity movement of left upper, lower extremity, pain of left shoulder, and finger, limited range of motion with passive stretch,  Prior authorization of xeomin  500 units, return to clinic for electrical stimulation guided injection  Electrical stimulation guided guided Xeomin  injection, used 500 units  She has skin breakdown at the left forearm, was wrapped,     Left pectoralis major 100 Left latissimus dorsi 100 Left brachial radialis 50 units Left brachialis 150 units Left opponens 50 units Left lumbricalis between the first, second, third fingers, 50 units   She will only return to clinic if her left forearm wound recovered  DIAGNOSTIC DATA (LABS, IMAGING, TESTING) - I reviewed patient records, labs, notes, testing and imaging myself where available.   MEDICAL HISTORY:  Kara Bailey is a 69 year old female, seen in request by her primary care doctor [place], [place]  for evaluation of spastic left hemiparesis, botulism toxin injection, she is accompanied by her facility driver at today's visit February 18, 2024  History is obtained from the patient and review of electronic medical records. I personally reviewed pertinent available imaging films in PACS.   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, had a G-tube placement on December 03, 2021 History of esophageal stricture, dilated in 2020,  She had a history of  left  thalamic stroke, was on aspirin  and Plavix , presenting with worsening left facial droop, dysarthria, left-sided weakness on July 26, 2022, MRI of the brain demonstrated acute stroke at the right corona radiata and posterior lentiform, since stroke, she had a significant  residual spastic left hemiparesis,  She lost the use of her left arm, significant left finger pain, shoulder pain with passive stretch, no antigravity movement of left lower extremity, needing help transfer  CT angiogram of head and neck showed no large vessel disease  She later developed significant dysarthria, dysphagia required transient PEG tube placement December 03, 2022, and was discharged to nursing home  Echocardiogram in September 2023 ejection fraction more than 75%, hyperdynamic function of left ventricle, no regional wall motion abnormality  UPDATE May 7th 2025: She lives at nursing home, brought in by transportation today, has left arm in wrap due to skin break down, this is her first electrical stimulation guided injections for spastic left hemiparesis, injection sites are limited to left upper shoulder,   UPDATE August 9th 2025: She is brought in by nursing home transportation, denies significant improvement with previous injection, will repeat injection at this time, if she did not notice any improvement after this second trial, we will not continue  UPDATE Dec 04 2023: She has autoimmune condition, 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 Pupils are round equal and briskly reactive to light. CN III, IV, VI:  extraocular movement are normal. No ptosis. CN V: Facial sensation is intact to light touch CN VII: Face is symmetric with normal eye closure  CN VIII: Hearing is normal to causal conversation. CN  IX, X: Phonation is normal. CN XI: Head turning and shoulder shrug are intact  MOTOR: Spastic left hemiparesis, no antigravity movement of left upper or lower extremity, left hand form in finger flexion, with passive stretch, finger can reach full range of motion, tight left shoulder, complains of pain with passive stretch, preserved range of motion of left elbow, tendency for left ankle plantarflexion  REFLEXES: Hyperreflexia of left upper and lower extremity  SENSORY: Intact to light touch, pinprick and vibratory sensation are intact in fingers and toes.  COORDINATION: There is no trunk or limb dysmetria noted.  GAIT/STANCE: Deferred  REVIEW OF SYSTEMS:  Full 14 system review of systems performed and notable only for as above All other review of systems were negative.   ALLERGIES: No Known Allergies  HOME MEDICATIONS: Current Outpatient Medications  Medication Sig Dispense Refill   acetaminophen  (TYLENOL ) 325 MG tablet Take 2 tablets (650 mg total) by mouth every 6 (six) hours as needed for mild pain (pain score 1-3), fever, headache or moderate pain (pain score 4-6).     albuterol  (VENTOLIN  HFA) 108 (90 Base) MCG/ACT inhaler Inhale 2 puffs into the lungs every 6 (six) hours as needed for wheezing or shortness of breath.     ALPRAZolam  (XANAX ) 0.5 MG tablet Take 1 tablet (0.5 mg total) by mouth 3 (three) times daily. 9 tablet 0   aspirin  EC 81 MG tablet Take 81 mg by mouth daily. Swallow whole.     atorvastatin  (LIPITOR ) 80 MG tablet Take 1 tablet (80 mg total) by mouth every evening.     baclofen  (LIORESAL ) 10 MG tablet Take 10 mg by mouth daily.     busPIRone  (BUSPAR ) 5 MG tablet Take 1 tablet (5 mg total) by mouth 3 (three) times daily.     Calcium  Carb-Cholecalciferol  (CALCIUM  1000 + D) 1000-20 MG-MCG TABS Take 1 tablet by mouth daily.     clobetasol cream (TEMOVATE) 0.05 % Apply 1 Application topically 2 (two) times daily.     clopidogrel  (PLAVIX ) 75 MG tablet Take 1 tablet  (75 mg total) by mouth daily. 90 tablet 0   ezetimibe  (ZETIA ) 10 MG tablet Take 10 mg by mouth daily.     hydroxypropyl methylcellulose / hypromellose (ISOPTO TEARS / GONIOVISC) 2.5 % ophthalmic solution Place 1 drop into both eyes in the morning and at bedtime.     hydrOXYzine (ATARAX) 25 MG tablet Take 25 mg by mouth 2 (two) times daily.     incobotulinumtoxinA  (XEOMIN ) 100 units SOLR injection Inject 500 Units into the muscle every 3 (three) months. 5 each 1   lamoTRIgine (LAMICTAL) 25 MG tablet Take 50 mg by mouth daily.     melatonin 3 MG TABS tablet Take 3 mg by mouth at bedtime as needed.     mirtazapine  (REMERON ) 7.5 MG tablet Take 7.5 mg by mouth at bedtime.     mycophenolate  (CELLCEPT ) 500 MG tablet Take 1,000 mg by mouth daily.     Nutritional Supplement LIQD Take 120 mLs by mouth daily. Listed as House Supplement in [facility]     ondansetron  (ZOFRAN -ODT) 4 MG disintegrating tablet Take 4 mg by mouth every 8 (eight) hours as needed for nausea or vomiting.     oxycodone  (OXY-IR) 5 MG capsule Take 1 capsule (5 mg total)  by mouth every 6 (six) hours as needed (moderate to severe pain). 12 capsule 0   polyethylene glycol (MIRALAX  / GLYCOLAX ) 17 g packet Take 17 g by mouth daily as needed. (Patient taking differently: Take 17 g by mouth as needed for mild constipation.) 14 each 0   thiamine  (VITAMIN B-1) 100 MG tablet Take 100 mg by mouth daily.     Current Facility-Administered Medications  Medication Dose Route Frequency Provider Last Rate Last Admin   incobotulinumtoxinA  (XEOMIN ) 100 units injection 500 Units  500 Units Intramuscular Q90 days        incobotulinumtoxinA  (XEOMIN ) 100 units injection 500 Units  500 Units Intramuscular Q90 days Onita Duos, MD   500 Units at 06/20/24 1028    PAST MEDICAL HISTORY: Past Medical History:  Diagnosis Date   Acute CVA (cerebrovascular accident) (HCC) 12/29/2020   Anxiety    Chest pain 08/2014   unspecified   Cirrhosis (HCC)    CVA (cerebral  vascular accident) (HCC) 07/27/2022   Depression    Dizziness 08/2014   Dysphagia following cerebral infarction    Dyspnea 08/2014   Dysrhythmia    Emphysema lung (HCC)    GERD (gastroesophageal reflux disease)    Heart murmur    Hemiplegia, unspecified affecting left nondominant side (HCC)    Hepatitis C    Hiatal hernia    Hyperlipidemia    Hypertension    Insomnia    Ischemic stroke (HCC) 08/01/2022   Muscle weakness (generalized)    Other muscle spasm    PAC (premature atrial contraction) 10/11/2014   PAD (peripheral artery disease)    Palpitations    PVC (premature ventricular contraction) 10/11/2014   PVD (peripheral vascular disease)    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   Stroke (HCC)    just slurred speech   Tachycardia 08/2014   Ventral hernia    Weakness 08/2014    PAST SURGICAL HISTORY: Past Surgical History:  Procedure Laterality Date   ABDOMINAL AORTOGRAM W/LOWER EXTREMITY N/A 07/13/2021   Procedure: ABDOMINAL AORTOGRAM W/LOWER EXTREMITY;  Surgeon: Court Dorn PARAS, MD;  Location: [facility];  Service: Cardiovascular;  Laterality: N/A;   ABDOMINAL AORTOGRAM W/LOWER EXTREMITY N/A 03/19/2022   Procedure: ABDOMINAL AORTOGRAM W/LOWER EXTREMITY;  Surgeon: Court Dorn PARAS, MD;  Location: [facility];  Service: Cardiovascular;  Laterality: N/A;   APPENDECTOMY     CATARACT EXTRACTION, BILATERAL     COLONOSCOPY     COLONOSCOPY WITH PROPOFOL  N/A 04/30/2016   Procedure: COLONOSCOPY WITH PROPOFOL ;  Surgeon: Lamar CHRISTELLA Hollingshead, MD;  Location: [facility];  Service: Endoscopy;  Laterality: N/A;  1230   EPIGASTRIC HERNIA REPAIR N/A 10/13/2021   Procedure: OPEN REPAIR EPIGASTRIC HERNIA WITH MESH PATCH;  Surgeon: Eletha Boas, MD;  Location: [facility];  Service: General;  Laterality: N/A;   ESOPHAGOGASTRODUODENOSCOPY     approximately 2010   ESOPHAGOGASTRODUODENOSCOPY (EGD) WITH PROPOFOL  N/A 04/30/2016   Procedure: ESOPHAGOGASTRODUODENOSCOPY (EGD)  WITH PROPOFOL ;  Surgeon: Lamar CHRISTELLA Hollingshead, MD;  Location: [facility];  Service: Endoscopy;  Laterality: N/A;   ESOPHAGOGASTRODUODENOSCOPY (EGD) WITH PROPOFOL  N/A 12/28/2022   Procedure: ESOPHAGOGASTRODUODENOSCOPY (EGD) WITH PROPOFOL ;  Surgeon: Avram Lupita BRAVO, MD;  Location: [facility];  Service: Gastroenterology;  Laterality: N/A;   IR GASTROSTOMY TUBE MOD SED  12/03/2022   IR GASTROSTOMY TUBE REMOVAL  09/12/2023   LEFT HEART CATHETERIZATION WITH CORONARY ANGIOGRAM N/A 10/29/2014   07-14-20- pt denies this Procedure: LEFT HEART CATHETERIZATION WITH CORONARY ANGIOGRAM;  Surgeon: Lonni  JONETTA Cash, MD;  Location: [facility];  Service: Cardiovascular;  Laterality: N/A;   MALONEY DILATION N/A 04/30/2016   Procedure: AGAPITO DILATION;  Surgeon: Lamar CHRISTELLA Hollingshead, MD;  [facility]: [facility];  Service: Endoscopy;  Laterality: N/A;   PERIPHERAL VASCULAR INTERVENTION  07/13/2021   Procedure: PERIPHERAL VASCULAR INTERVENTION;  Surgeon: Court Dorn PARAS, MD;  [facility];  Service: Cardiovascular;;  left SFA left external iliac   PERIPHERAL VASCULAR INTERVENTION Right 03/19/2022   Procedure: PERIPHERAL VASCULAR INTERVENTION;  Surgeon: Court Dorn PARAS, MD;  [facility];  Service: Cardiovascular;  Laterality: Right;  SFA   POLYPECTOMY  04/30/2016   Procedure: POLYPECTOMY;  Surgeon: Lamar CHRISTELLA Hollingshead, MD;  Location: [facility];  Service: Endoscopy;;  Sigmoid colon polyp removed via hot snare   UPPER GASTROINTESTINAL ENDOSCOPY      FAMILY HISTORY: Family History  Problem Relation Age of Onset   Heart failure Mother    Heart attack Mother    Heart disease Mother    Hypertension Mother    Hyperlipidemia Mother    CVA Father    Alzheimer's disease Father    Cancer Neg Hx    Colon cancer Neg Hx    Esophageal cancer Neg Hx    Rectal cancer Neg Hx    Stomach cancer Neg Hx     SOCIAL HISTORY: Social History   Socioeconomic History   Marital status: Divorced     Spouse name: Not on file   Number of children: 2   Years of education: Not on file   Highest education level: Not on file  Occupational History   Occupation: retired  Tobacco Use   Smoking status: Former    Current packs/day: 0.50    Average packs/day: 0.5 packs/day for 56.1 years (28.0 ttl pk-yrs)    Types: Cigarettes    Start date: 11/12/1968   Smokeless tobacco: Never   Tobacco comments:    cutting back, wearing patches  Vaping Use   Vaping status: Never Used  Substance and Sexual Activity   Alcohol use: Not Currently    Comment: beer on weekends (6-pack)   Drug use: No   Sexual activity: Not Currently  Other Topics Concern   Not on file  Social History Narrative   Not on file   Social Drivers of Health   Tobacco Use: Medium Risk (08/25/2024)   Received from [facility]   Patient History    Smoking Tobacco Use: Former    Smokeless Tobacco Use: Never    Passive Exposure: Not on Actuary Strain: Not on file  Food Insecurity: No Food Insecurity (03/31/2024)   Hunger Vital Sign    Worried About Running Out of Food in the Last Year: Never true    Ran Out of Food in the Last Year: Never true  Transportation Needs: No Transportation Needs (03/31/2024)   PRAPARE - Administrator, Civil Service (Medical): No    Lack of Transportation (Non-Medical): No  Physical Activity: Not on file  Stress: Not on file  Social Connections: Socially Isolated (03/31/2024)   Social Connection and Isolation Panel    Frequency of Communication with Friends and Family: Twice a week    Frequency of Social Gatherings with Friends and Family: Never    Attends Religious Services: Never    Database Administrator or Organizations: No    Attends Banker Meetings: Never    Marital Status: Divorced  Intimate Partner Violence: Not At Risk (03/31/2024)   Humiliation, Afraid, Rape, and Kick questionnaire    Fear of Current or Ex-Partner: No    Emotionally Abused:  No    Physically Abused: No    Sexually Abused: No  Depression (PHQ2-9): High Risk (09/19/2022)   Depression (PHQ2-9)    PHQ-2 Score: 20  Alcohol Screen: Not on file  Housing: Low Risk (03/31/2024)   Housing Stability Vital Sign    Unable to Pay for Housing in the Last Year: No    Number of Times Moved in the Last Year: 0    Homeless in the Last Year: No  Utilities: Not At Risk (03/31/2024)   [facility] Utilities    Threatened with loss of utilities: No  Health Literacy: Not on file      Modena Callander, M.D. Ph.D.  [facility] [address], [address] [place], [place] [address] Ph: [phone] Fax: [phone]  CC:  Tobie Belton, DO [address] [place],  [place] [address]  Tobie Belton, DO   "

## 2024-12-03 NOTE — Progress Notes (Signed)
 xeomin  100units x 5 vial  Ndc-[phone] [id number] Exp-2027/12 B/B  Bacteriostatic 0.9% Sodium Chloride - 10mL  Onu:[id number] Expiration: 09/11/2025 NDC: 9590803397 Dx: P30.645  WITNESSED AB:[id number] RMA

## 2025-03-03 ENCOUNTER — Ambulatory Visit: Admitting: Neurology
